# Patient Record
Sex: Male | Born: 1935 | Race: White | Hispanic: No | Marital: Married | Smoking: Former smoker
Health system: Southern US, Community
[De-identification: ages and names within clinical notes are randomized; demographics above are authoritative.]

## PROBLEM LIST (undated history)

## (undated) DIAGNOSIS — F1010 Alcohol abuse, uncomplicated: Secondary | ICD-10-CM

## (undated) DIAGNOSIS — I2510 Atherosclerotic heart disease of native coronary artery without angina pectoris: Secondary | ICD-10-CM

## (undated) DIAGNOSIS — D509 Iron deficiency anemia, unspecified: Secondary | ICD-10-CM

## (undated) DIAGNOSIS — I480 Paroxysmal atrial fibrillation: Secondary | ICD-10-CM

## (undated) DIAGNOSIS — I219 Acute myocardial infarction, unspecified: Secondary | ICD-10-CM

## (undated) DIAGNOSIS — N419 Inflammatory disease of prostate, unspecified: Secondary | ICD-10-CM

## (undated) DIAGNOSIS — K219 Gastro-esophageal reflux disease without esophagitis: Secondary | ICD-10-CM

## (undated) DIAGNOSIS — K589 Irritable bowel syndrome without diarrhea: Secondary | ICD-10-CM

## (undated) DIAGNOSIS — K259 Gastric ulcer, unspecified as acute or chronic, without hemorrhage or perforation: Secondary | ICD-10-CM

## (undated) DIAGNOSIS — C3490 Malignant neoplasm of unspecified part of unspecified bronchus or lung: Secondary | ICD-10-CM

## (undated) DIAGNOSIS — Z5189 Encounter for other specified aftercare: Secondary | ICD-10-CM

## (undated) DIAGNOSIS — C801 Malignant (primary) neoplasm, unspecified: Secondary | ICD-10-CM

## (undated) DIAGNOSIS — M1990 Unspecified osteoarthritis, unspecified site: Secondary | ICD-10-CM

## (undated) DIAGNOSIS — I509 Heart failure, unspecified: Secondary | ICD-10-CM

## (undated) DIAGNOSIS — K635 Polyp of colon: Secondary | ICD-10-CM

## (undated) DIAGNOSIS — I495 Sick sinus syndrome: Secondary | ICD-10-CM

## (undated) DIAGNOSIS — I491 Atrial premature depolarization: Secondary | ICD-10-CM

## (undated) DIAGNOSIS — R7302 Impaired glucose tolerance (oral): Secondary | ICD-10-CM

## (undated) DIAGNOSIS — K222 Esophageal obstruction: Secondary | ICD-10-CM

## (undated) DIAGNOSIS — R252 Cramp and spasm: Secondary | ICD-10-CM

## (undated) DIAGNOSIS — I493 Ventricular premature depolarization: Secondary | ICD-10-CM

## (undated) DIAGNOSIS — I10 Essential (primary) hypertension: Secondary | ICD-10-CM

## (undated) DIAGNOSIS — R609 Edema, unspecified: Secondary | ICD-10-CM

## (undated) DIAGNOSIS — M545 Low back pain: Secondary | ICD-10-CM

## (undated) DIAGNOSIS — I739 Peripheral vascular disease, unspecified: Secondary | ICD-10-CM

## (undated) DIAGNOSIS — G609 Hereditary and idiopathic neuropathy, unspecified: Secondary | ICD-10-CM

## (undated) DIAGNOSIS — D649 Anemia, unspecified: Secondary | ICD-10-CM

## (undated) DIAGNOSIS — Z86010 Personal history of colonic polyps: Secondary | ICD-10-CM

## (undated) DIAGNOSIS — G8929 Other chronic pain: Secondary | ICD-10-CM

## (undated) DIAGNOSIS — E785 Hyperlipidemia, unspecified: Secondary | ICD-10-CM

## (undated) DIAGNOSIS — I8390 Asymptomatic varicose veins of unspecified lower extremity: Secondary | ICD-10-CM

## (undated) HISTORY — DX: Inflammatory disease of prostate, unspecified: N41.9

## (undated) HISTORY — DX: Acute myocardial infarction, unspecified: I21.9

## (undated) HISTORY — DX: Atrial premature depolarization: I49.1

## (undated) HISTORY — DX: Alcohol abuse, uncomplicated: F10.10

## (undated) HISTORY — PX: COLONOSCOPY: SHX174

## (undated) HISTORY — DX: Iron deficiency anemia, unspecified: D50.9

## (undated) HISTORY — DX: Irritable bowel syndrome without diarrhea: K58.9

## (undated) HISTORY — DX: Low back pain: M54.5

## (undated) HISTORY — DX: Peripheral vascular disease, unspecified: I73.9

## (undated) HISTORY — DX: Malignant (primary) neoplasm, unspecified: C80.1

## (undated) HISTORY — DX: Hyperlipidemia, unspecified: E78.5

## (undated) HISTORY — DX: Ventricular premature depolarization: I49.3

## (undated) HISTORY — DX: Hereditary and idiopathic neuropathy, unspecified: G60.9

## (undated) HISTORY — DX: Paroxysmal atrial fibrillation: I48.0

## (undated) HISTORY — DX: Asymptomatic varicose veins of unspecified lower extremity: I83.90

## (undated) HISTORY — DX: Essential (primary) hypertension: I10

## (undated) HISTORY — DX: Encounter for other specified aftercare: Z51.89

## (undated) HISTORY — PX: UPPER GASTROINTESTINAL ENDOSCOPY: SHX188

## (undated) HISTORY — DX: Unspecified osteoarthritis, unspecified site: M19.90

## (undated) HISTORY — DX: Gastric ulcer, unspecified as acute or chronic, without hemorrhage or perforation: K25.9

## (undated) HISTORY — DX: Impaired glucose tolerance (oral): R73.02

## (undated) HISTORY — DX: Cramp and spasm: R25.2

## (undated) HISTORY — DX: Atherosclerotic heart disease of native coronary artery without angina pectoris: I25.10

## (undated) HISTORY — PX: COLECTOMY: SHX59

## (undated) HISTORY — DX: Esophageal obstruction: K22.2

## (undated) HISTORY — PX: CATARACT EXTRACTION: SUR2

## (undated) HISTORY — DX: Edema, unspecified: R60.9

## (undated) HISTORY — DX: Sick sinus syndrome: I49.5

## (undated) HISTORY — DX: Other chronic pain: G89.29

## (undated) HISTORY — DX: Gastro-esophageal reflux disease without esophagitis: K21.9

## (undated) HISTORY — DX: Personal history of colonic polyps: Z86.010

---

## 1988-10-11 LAB — HM COLONOSCOPY: HM Colonoscopy: ABNORMAL

## 1992-10-11 DIAGNOSIS — K219 Gastro-esophageal reflux disease without esophagitis: Secondary | ICD-10-CM

## 1992-10-11 HISTORY — DX: Gastro-esophageal reflux disease without esophagitis: K21.9

## 2003-05-29 LAB — HM COLONOSCOPY: HM Colonoscopy: ABNORMAL

## 2006-03-31 ENCOUNTER — Ambulatory Visit: Payer: Self-pay | Admitting: Internal Medicine

## 2006-05-31 ENCOUNTER — Ambulatory Visit: Payer: Self-pay | Admitting: *Deleted

## 2006-06-02 ENCOUNTER — Ambulatory Visit (HOSPITAL_COMMUNITY): Admission: RE | Admit: 2006-06-02 | Discharge: 2006-06-02 | Payer: Self-pay | Admitting: *Deleted

## 2007-05-04 ENCOUNTER — Ambulatory Visit: Payer: Self-pay | Admitting: Internal Medicine

## 2007-05-04 LAB — CONVERTED CEMR LAB
AST: 26 units/L (ref 0–37)
Basophils Absolute: 0 10*3/uL (ref 0.0–0.1)
Bilirubin, Direct: 0.1 mg/dL (ref 0.0–0.3)
Chloride: 97 meq/L (ref 96–112)
Cholesterol: 148 mg/dL (ref 0–200)
Creatinine, Ser: 0.7 mg/dL (ref 0.4–1.5)
Eosinophils Absolute: 0.1 10*3/uL (ref 0.0–0.6)
Eosinophils Relative: 1.4 % (ref 0.0–5.0)
GFR calc non Af Amer: 117 mL/min
Glucose, Bld: 109 mg/dL — ABNORMAL HIGH (ref 70–99)
HCT: 43.3 % (ref 39.0–52.0)
HDL: 44.9 mg/dL (ref >39.0)
Hemoglobin, Urine: NEGATIVE
Hemoglobin: 15 g/dL (ref 13.0–17.0)
Ketones, ur: NEGATIVE mg/dL
Leukocytes, UA: NEGATIVE
MCHC: 34.7 g/dL (ref 30.0–36.0)
MCV: 89 fL (ref 78.0–100.0)
Monocytes Absolute: 0.7 10*3/uL (ref 0.2–0.7)
Neutrophils Relative %: 63.1 % (ref 43.0–77.0)
Nitrite: NEGATIVE
PSA: 0.39 ng/mL (ref 0.10–4.00)
Potassium: 4.4 meq/L (ref 3.5–5.1)
RBC: 4.86 M/uL (ref 4.22–5.81)
Sodium: 134 meq/L — ABNORMAL LOW (ref 135–145)
TSH: 0.94 microintl units/mL (ref 0.35–5.50)
Total Bilirubin: 0.7 mg/dL (ref 0.3–1.2)
Urobilinogen, UA: 0.2 (ref 0.0–1.0)
WBC: 5.6 10*3/uL (ref 4.5–10.5)

## 2008-03-05 ENCOUNTER — Ambulatory Visit: Payer: Self-pay | Admitting: Internal Medicine

## 2008-03-05 DIAGNOSIS — G609 Hereditary and idiopathic neuropathy, unspecified: Secondary | ICD-10-CM | POA: Insufficient documentation

## 2008-03-05 DIAGNOSIS — K219 Gastro-esophageal reflux disease without esophagitis: Secondary | ICD-10-CM | POA: Insufficient documentation

## 2008-03-05 DIAGNOSIS — Z860100 Personal history of colon polyps, unspecified: Secondary | ICD-10-CM | POA: Insufficient documentation

## 2008-03-05 DIAGNOSIS — K589 Irritable bowel syndrome without diarrhea: Secondary | ICD-10-CM | POA: Insufficient documentation

## 2008-03-05 DIAGNOSIS — I10 Essential (primary) hypertension: Secondary | ICD-10-CM

## 2008-03-05 DIAGNOSIS — K921 Melena: Secondary | ICD-10-CM | POA: Insufficient documentation

## 2008-03-05 DIAGNOSIS — E785 Hyperlipidemia, unspecified: Secondary | ICD-10-CM

## 2008-03-05 DIAGNOSIS — M549 Dorsalgia, unspecified: Secondary | ICD-10-CM | POA: Insufficient documentation

## 2008-03-05 DIAGNOSIS — D509 Iron deficiency anemia, unspecified: Secondary | ICD-10-CM | POA: Insufficient documentation

## 2008-03-05 HISTORY — DX: Gastro-esophageal reflux disease without esophagitis: K21.9

## 2008-03-05 HISTORY — DX: Irritable bowel syndrome, unspecified: K58.9

## 2008-03-05 HISTORY — DX: Hyperlipidemia, unspecified: E78.5

## 2008-03-05 HISTORY — DX: Essential (primary) hypertension: I10

## 2008-03-05 HISTORY — DX: Personal history of colonic polyps: Z86.010

## 2008-03-05 HISTORY — DX: Hereditary and idiopathic neuropathy, unspecified: G60.9

## 2008-03-05 HISTORY — DX: Personal history of colon polyps, unspecified: Z86.0100

## 2008-07-29 ENCOUNTER — Telehealth (INDEPENDENT_AMBULATORY_CARE_PROVIDER_SITE_OTHER): Payer: Self-pay | Admitting: *Deleted

## 2008-10-31 ENCOUNTER — Encounter: Payer: Self-pay | Admitting: Internal Medicine

## 2008-11-11 HISTORY — PX: LUMBAR SPINE SURGERY: SHX701

## 2008-11-14 ENCOUNTER — Ambulatory Visit (HOSPITAL_COMMUNITY): Admission: RE | Admit: 2008-11-14 | Discharge: 2008-11-14 | Payer: Self-pay | Admitting: Neurosurgery

## 2008-11-14 ENCOUNTER — Encounter (INDEPENDENT_AMBULATORY_CARE_PROVIDER_SITE_OTHER): Payer: Self-pay | Admitting: Neurosurgery

## 2008-11-14 ENCOUNTER — Ambulatory Visit: Payer: Self-pay | Admitting: Vascular Surgery

## 2008-11-22 ENCOUNTER — Encounter: Payer: Self-pay | Admitting: Internal Medicine

## 2008-11-26 ENCOUNTER — Inpatient Hospital Stay (HOSPITAL_COMMUNITY): Admission: RE | Admit: 2008-11-26 | Discharge: 2008-11-28 | Payer: Self-pay | Admitting: Neurosurgery

## 2008-12-26 ENCOUNTER — Encounter: Payer: Self-pay | Admitting: Internal Medicine

## 2009-03-26 ENCOUNTER — Encounter: Payer: Self-pay | Admitting: Internal Medicine

## 2009-04-03 ENCOUNTER — Ambulatory Visit: Payer: Self-pay | Admitting: Surgery

## 2009-04-03 ENCOUNTER — Ambulatory Visit (HOSPITAL_COMMUNITY): Admission: RE | Admit: 2009-04-03 | Discharge: 2009-04-03 | Payer: Self-pay | Admitting: Neurosurgery

## 2009-04-03 ENCOUNTER — Encounter (INDEPENDENT_AMBULATORY_CARE_PROVIDER_SITE_OTHER): Payer: Self-pay | Admitting: Neurosurgery

## 2009-05-22 ENCOUNTER — Encounter: Admission: RE | Admit: 2009-05-22 | Discharge: 2009-05-22 | Payer: Self-pay | Admitting: Neurosurgery

## 2009-05-27 ENCOUNTER — Encounter: Payer: Self-pay | Admitting: Internal Medicine

## 2009-06-03 ENCOUNTER — Telehealth: Payer: Self-pay | Admitting: Internal Medicine

## 2009-06-03 ENCOUNTER — Encounter: Admission: RE | Admit: 2009-06-03 | Discharge: 2009-06-03 | Payer: Self-pay | Admitting: Neurosurgery

## 2009-06-09 ENCOUNTER — Ambulatory Visit: Payer: Self-pay | Admitting: Internal Medicine

## 2009-06-09 DIAGNOSIS — I8390 Asymptomatic varicose veins of unspecified lower extremity: Secondary | ICD-10-CM

## 2009-06-09 HISTORY — DX: Asymptomatic varicose veins of unspecified lower extremity: I83.90

## 2009-06-09 LAB — CONVERTED CEMR LAB
ALT: 41 units/L (ref 0–53)
AST: 28 units/L (ref 0–37)
Alkaline Phosphatase: 73 units/L (ref 39–117)
BUN: 22 mg/dL (ref 6–23)
Bilirubin Urine: NEGATIVE
Bilirubin, Direct: 0.1 mg/dL (ref 0.0–0.3)
Calcium: 9.6 mg/dL (ref 8.4–10.5)
Creatinine, Ser: 0.8 mg/dL (ref 0.4–1.5)
Eosinophils Relative: 0.4 % (ref 0.0–5.0)
GFR calc non Af Amer: 100.04 mL/min (ref >60)
Hemoglobin, Urine: NEGATIVE
LDL Cholesterol: 72 mg/dL (ref 0–99)
Lymphocytes Relative: 18.3 % (ref 12.0–46.0)
Monocytes Absolute: 0.7 10*3/uL (ref 0.1–1.0)
Monocytes Relative: 10.7 % (ref 3.0–12.0)
Neutrophils Relative %: 70.2 % (ref 43.0–77.0)
Nitrite: NEGATIVE
Platelets: 237 10*3/uL (ref 150.0–400.0)
RBC: 4.62 M/uL (ref 4.22–5.81)
TSH: 0.5 microintl units/mL (ref 0.35–5.50)
Total Bilirubin: 0.9 mg/dL (ref 0.3–1.2)
Total CHOL/HDL Ratio: 3
Total Protein, Urine: NEGATIVE mg/dL
Triglycerides: 191 mg/dL — ABNORMAL HIGH (ref 0.0–149.0)
WBC: 6.7 10*3/uL (ref 4.5–10.5)

## 2009-06-23 ENCOUNTER — Encounter: Payer: Self-pay | Admitting: Internal Medicine

## 2009-06-30 ENCOUNTER — Ambulatory Visit: Payer: Self-pay | Admitting: Gastroenterology

## 2009-07-10 ENCOUNTER — Encounter: Payer: Self-pay | Admitting: Internal Medicine

## 2009-07-14 ENCOUNTER — Encounter: Payer: Self-pay | Admitting: Gastroenterology

## 2009-07-14 ENCOUNTER — Ambulatory Visit: Payer: Self-pay | Admitting: Gastroenterology

## 2009-07-14 ENCOUNTER — Encounter: Payer: Self-pay | Admitting: Internal Medicine

## 2009-07-18 ENCOUNTER — Encounter: Payer: Self-pay | Admitting: Gastroenterology

## 2009-08-28 ENCOUNTER — Encounter: Payer: Self-pay | Admitting: Internal Medicine

## 2009-09-10 ENCOUNTER — Inpatient Hospital Stay (HOSPITAL_COMMUNITY): Admission: RE | Admit: 2009-09-10 | Discharge: 2009-09-12 | Payer: Self-pay | Admitting: Neurosurgery

## 2009-10-13 ENCOUNTER — Encounter: Payer: Self-pay | Admitting: Internal Medicine

## 2009-11-11 ENCOUNTER — Telehealth: Payer: Self-pay | Admitting: Internal Medicine

## 2010-07-06 ENCOUNTER — Telehealth: Payer: Self-pay | Admitting: Internal Medicine

## 2010-09-02 ENCOUNTER — Ambulatory Visit: Payer: Self-pay | Admitting: Internal Medicine

## 2010-09-02 DIAGNOSIS — E739 Lactose intolerance, unspecified: Secondary | ICD-10-CM | POA: Insufficient documentation

## 2010-09-02 DIAGNOSIS — R609 Edema, unspecified: Secondary | ICD-10-CM

## 2010-09-02 DIAGNOSIS — R252 Cramp and spasm: Secondary | ICD-10-CM | POA: Insufficient documentation

## 2010-09-02 HISTORY — DX: Edema, unspecified: R60.9

## 2010-09-02 HISTORY — DX: Cramp and spasm: R25.2

## 2010-09-03 LAB — CONVERTED CEMR LAB
Alkaline Phosphatase: 68 units/L (ref 39–117)
BUN: 11 mg/dL (ref 6–23)
CO2: 24 meq/L (ref 19–32)
Chloride: 97 meq/L (ref 96–112)
Creatinine, Ser: 0.98 mg/dL (ref 0.40–1.50)
Indirect Bilirubin: 0.4 mg/dL (ref 0.0–0.9)
LDL Cholesterol: 73 mg/dL (ref 0–99)
Total Bilirubin: 0.6 mg/dL (ref 0.3–1.2)
Triglycerides: 149 mg/dL (ref <150)

## 2010-09-04 LAB — CONVERTED CEMR LAB
Bilirubin Urine: NEGATIVE
Eosinophils Relative: 0.8 % (ref 0.0–5.0)
HCT: 43.7 % (ref 39.0–52.0)
Hemoglobin: 15.1 g/dL (ref 13.0–17.0)
Hgb A1c MFr Bld: 6.3 % (ref 4.6–6.5)
Ketones, ur: NEGATIVE mg/dL
Lymphs Abs: 1.3 10*3/uL (ref 0.7–4.0)
Monocytes Relative: 13.1 % — ABNORMAL HIGH (ref 3.0–12.0)
Neutro Abs: 3 10*3/uL (ref 1.4–7.7)
Nitrite: NEGATIVE
Total Protein, Urine: NEGATIVE mg/dL
WBC: 5 10*3/uL (ref 4.5–10.5)
pH: 6 (ref 5.0–8.0)

## 2010-11-12 NOTE — Assessment & Plan Note (Signed)
Summary: follow up pain in toes and weight gain-lb   Vital Signs:  Patient profile:   77 year old male Height:      71 inches Weight:      242 pounds BMI:     33.87 O2 Sat:      97 % on Room air Temp:     97.9 degrees F oral Pulse rate:   93 / minute BP sitting:   124 / 72  (left arm) Cuff size:   large  Vitals Entered By: Zella Ball Ewing CMA Duncan Dull) (September 02, 2010 11:04 AM)  O2 Flow:  Room air  Preventive Care Screening     declines immunizations  CC: Back pain, weight gain, refills/RE   CC:  Back pain, weight gain, and refills/RE.  History of Present Illness: here for wellness and f/u - overall doing well, Pt denies CP, worsening sob, doe, wheezing, orthopnea, pnd, worsening LE edema, palps, dizziness or syncope  Pt denies new neuro symptoms such as headache, facial or extremity weakness  Pt denies polydipsia, polyuria,  Overall good compliance with meds, trying to follow low chol, DM diet, wt stable, little excercise however .  No fever, wt loss, night sweats, loss of appetite or other constitutional symptoms Denies worsening depressive symptoms, suicidal ideation, or panic.   Overall good compliance with meds, and good tolerability.  Pt states good ability with ADL's, low fall risk, home safety reviewed and adequate, no significant change in hearing or vision, trying to follow lower chol diet, and occasionally active only with regular excercise. Does have occasional leg cramps, leg swelling mild persists, and chronic back pain persists no change , meds working well for control.    Preventive Screening-Counseling & Management      Drug Use:  no.    Problems Prior to Update: 1)  Leg Cramps  (ICD-729.82) 2)  Peripheral Edema  (ICD-782.3) 3)  Glucose Intolerance  (ICD-271.3) 4)  Varicose Veins, Lower Extremities  (ICD-454.9) 5)  Preventive Health Care  (ICD-V70.0) 6)  Peripheral Neuropathy  (ICD-356.9) 7)  Gerd  (ICD-530.81) 8)  Ibs  (ICD-564.1) 9)  Colonic Polyps, Hx of   (ICD-V12.72) 10)  Anemia-nos  (ICD-285.9) 11)  Back Pain  (ICD-724.5) 12)  Blood in Stool  (ICD-578.1) 13)  Hypertension  (ICD-401.9) 14)  Hyperlipidemia  (ICD-272.4)  Medications Prior to Update: 1)  Lisinopril-Hydrochlorothiazide 20-25 Mg  Tabs (Lisinopril-Hydrochlorothiazide) .Marland Kitchen.. 1 By Mouth Qd 2)  Pravachol 40 Mg  Tabs (Pravastatin Sodium) .Marland Kitchen.. 1 By Mouth Qd 3)  Tramadol Hcl 50 Mg  Tabs (Tramadol Hcl) .Marland Kitchen.. 1-2 By Mouth Q 6 Hrs As Needed Pain 4)  Adult Aspirin Ec Low Strength 81 Mg  Tbec (Aspirin) .Marland Kitchen.. 1 By Mouth Once Daily  Current Medications (verified): 1)  Lisinopril-Hydrochlorothiazide 20-25 Mg  Tabs (Lisinopril-Hydrochlorothiazide) .Marland Kitchen.. 1 By Mouth Once Daily 2)  Lipitor 20 Mg Tabs (Atorvastatin Calcium) .Marland Kitchen.. 1po Once Daily 3)  Tramadol Hcl 50 Mg  Tabs (Tramadol Hcl) .Marland Kitchen.. 1-2 By Mouth Q 6 Hrs As Needed Pain 4)  Adult Aspirin Ec Low Strength 81 Mg  Tbec (Aspirin) .Marland Kitchen.. 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 - feb 2010 Married 4 sons Drug  use-no  Risk Factors: Smoking Status: quit (06/09/2009)  Past Medical History: Hyperlipidemia Hypertension Anemia-NOS Colonic polyps, hx of IBS GERD hx of esophageal stricture hx of prostatitis Peripheral neuropathy glucose intolerance  Social History: 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         all otherwise negative per pt -    Physical Exam  General:  alert and overweight-appearing.   Head:  normocephalic and atraumatic.   Eyes:  vision grossly intact, pupils equal, and pupils round.   Ears:  R ear normal and L ear normal.   Nose:  no external deformity and no nasal discharge.   Mouth:  no gingival abnormalities and pharynx pink and moist.   Neck:  supple and no masses.   Lungs:  normal respiratory effort and normal breath sounds.   Heart:  normal rate and regular rhythm.   Abdomen:  soft, non-tender, and normal bowel sounds.   Msk:  no joint tenderness and no joint swelling.  , spine nontender Extremities:  trace to 1+ edema bialt, no erythema Neurologic:  cranial nerves II-XII intact and strength normal in all extremities.  gait normal.     Impression & Recommendations:  Problem # 1:  Preventive Health Care (ICD-V70.0) Overall doing well, age appropriate education and counseling updated, referral for preventive services and immunizations addressed, dietary counseling and smoking status adressed , most recent labs reviewed I have personally reviewed and have noted 1.The patient's medical and social history 2.Their use of alcohol, tobacco or illicit drugs 3.Their current medications and supplements 4. Functional ability including ADL's, fall risk, home safety risk, hearing & visual impairment  5.Diet and physical activities 6.Evidence for depression or mood disorders The patients weight, height, BMI  have been recorded in the chart I have made referrals, counseling and provided education to the patient based review of the above  Orders: TLB-BMP (Basic Metabolic Panel-BMET) (80048-METABOL) TLB-CBC Platelet - w/Differential  (85025-CBCD) TLB-Hepatic/Liver Function Pnl (80076-HEPATIC) TLB-Lipid Panel (80061-LIPID) TLB-PSA (Prostate Specific Antigen) (84153-PSA) TLB-TSH (Thyroid Stimulating Hormone) (84443-TSH) TLB-Udip ONLY (81003-UDIP)  Problem # 2:  GLUCOSE INTOLERANCE (ICD-271.3) asympt - for a1c check Orders: TLB-A1C / Hgb A1C (Glycohemoglobin) (83036-A1C)  Problem # 3:  PERIPHERAL EDEMA (ICD-782.3)  His updated medication list for this problem includes:    Lisinopril-hydrochlorothiazide 20-25 Mg Tabs (Lisinopril-hydrochlorothiazide) .Marland Kitchen... 1 by mouth once daily unclear etiology except liekly at least element of venous insufficeincy;    Discussed elevation of the legs, use of compression stockings, sodium restiction, and medication use.   Problem # 4:  LEG CRAMPS (ICD-729.82) to also check magnesium,  ? statin related or HCTZ; overall mild, Continue all previous medications as before this visit except change the statin as below  Problem # 5:  HYPERLIPIDEMIA (ICD-272.4)  His updated medication list for this problem includes:    Lipitor 20 Mg Tabs (Atorvastatin calcium) .Marland Kitchen... 1po once daily to change the pravachol to lipitor 20 mg  for better efficacy, Pt to continue diet efforts, good med tolerance; to check labs - goal LDL less than 100  Labs Reviewed: SGOT: 28 (06/09/2009)   SGPT: 41 (  06/09/2009)   HDL:69.00 (06/09/2009), 44.9 (05/04/2007)  LDL:72 (06/09/2009), 69 (05/04/2007)  Chol:179 (06/09/2009), 148 (05/04/2007)  Trig:191.0 (06/09/2009), 171 (05/04/2007)  Problem # 6:  BACK PAIN (ICD-724.5)  His updated medication list for this problem includes:    Tramadol Hcl 50 Mg Tabs (Tramadol hcl) .Marland Kitchen... 1-2 by mouth q 6 hrs as needed pain    Adult Aspirin Ec Low Strength 81 Mg Tbec (Aspirin) .Marland Kitchen... 1 by mouth once daily chronic stable, ok to cont meds as is  Complete Medication List: 1)  Lisinopril-hydrochlorothiazide 20-25 Mg Tabs (Lisinopril-hydrochlorothiazide) .Marland Kitchen.. 1 by mouth once daily 2)   Lipitor 20 Mg Tabs (Atorvastatin calcium) .Marland Kitchen.. 1po once daily 3)  Tramadol Hcl 50 Mg Tabs (Tramadol hcl) .Marland Kitchen.. 1-2 by mouth q 6 hrs as needed pain 4)  Adult Aspirin Ec Low Strength 81 Mg Tbec (Aspirin) .Marland Kitchen.. 1 by mouth once daily  Patient Instructions: 1)  Please go to the Lab in the basement for your blood and/or urine tests today 2)  Please call the number on the [facility] Card for results of your testing 3)  Continue all previous medications as before this visit , except stop the pravachol 4)  start the lipitor 20 mg after dec 1 when it should be generic 5)  Please schedule a follow-up appointment in 1 year, or sooner if needed Prescriptions: TRAMADOL HCL 50 MG  TABS (TRAMADOL HCL) 1-2 by mouth q 6 hrs as needed pain  #120 x 5   Entered and Authorized by:   Corwin Levins MD   Signed by:   Corwin Levins MD on 09/02/2010   Method used:   Print then Give to Patient   RxID:   [id number] LIPITOR 20 MG TABS (ATORVASTATIN CALCIUM) 1po once daily  #90 x 3   Entered and Authorized by:   Corwin Levins MD   Signed by:   Corwin Levins MD on 09/02/2010   Method used:   Print then Give to Patient   RxID:   [id number] LISINOPRIL-HYDROCHLOROTHIAZIDE 20-25 MG  TABS (LISINOPRIL-HYDROCHLOROTHIAZIDE) 1 by mouth once daily  #90 x 3   Entered and Authorized by:   Corwin Levins MD   Signed by:   Corwin Levins MD on 09/02/2010   Method used:   Print then Give to Patient   RxID:   [id number]    Orders Added: 1)  TLB-A1C / Hgb A1C (Glycohemoglobin) [83036-A1C] 2)  TLB-BMP (Basic Metabolic Panel-BMET) [80048-METABOL] 3)  TLB-CBC Platelet - w/Differential [85025-CBCD] 4)  TLB-Hepatic/Liver Function Pnl [80076-HEPATIC] 5)  TLB-Lipid Panel [80061-LIPID] 6)  TLB-PSA (Prostate Specific Antigen) [01027-OZD] 7)  TLB-TSH (Thyroid Stimulating Hormone) [84443-TSH] 8)  TLB-Udip ONLY [81003-UDIP] 9)  Est. Patient 65& > [66440]  Appended Document: Orders Update    Clinical Lists Changes  Orders: Added  new Test order of T- * Misc. Laboratory test [phone]) - Signed

## 2010-11-12 NOTE — Progress Notes (Signed)
  Phone Note Refill Request Message from:  Fax from Pharmacy on July 06, 2010 12:02 PM  Refills Requested: Medication #1:  LISINOPRIL-HYDROCHLOROTHIAZIDE 20-25 MG  TABS 1 by mouth qd   Dosage confirmed as above?Dosage Confirmed   Last Refilled: 05/2009   Notes: [facility] Initial call taken by: Zella Ball Ewing CMA (AAMA),  July 06, 2010 12:02 PM    Prescriptions: LISINOPRIL-HYDROCHLOROTHIAZIDE 20-25 MG  TABS (LISINOPRIL-HYDROCHLOROTHIAZIDE) 1 by mouth qd  #30 Each x 0   Entered by:   Scharlene Gloss CMA (AAMA)   Authorized by:   Corwin Levins MD   Signed by:   Scharlene Gloss CMA (AAMA) on 07/06/2010   Method used:   Faxed to ...       [facility]       [address]       [place], [place]  [address]       Ph: [phone]       Fax: [phone]   RxID:   [id number]

## 2010-11-12 NOTE — Letter (Signed)
Summary: [facility]   Imported By: Sherian Rein 10/28/2009 14:43:25  _____________________________________________________________________  External Attachment:    Type:   Image     Comment:   External Document

## 2010-11-12 NOTE — Progress Notes (Signed)
  Phone Note Refill Request  on November 11, 2009 9:58 AM  Refills Requested: Medication #1:  LISINOPRIL-HYDROCHLOROTHIAZIDE 20-25 MG  TABS 1 by mouth qd   Dosage confirmed as above?Dosage Confirmed   Notes: [name] Initial call taken by: Scharlene Gloss,  November 11, 2009 9:58 AM    Prescriptions: LISINOPRIL-HYDROCHLOROTHIAZIDE 20-25 MG  TABS (LISINOPRIL-HYDROCHLOROTHIAZIDE) 1 by mouth qd  #30 Each x 7   Entered by:   Scharlene Gloss   Authorized by:   Corwin Levins MD   Signed by:   Scharlene Gloss on 11/11/2009   Method used:   Faxed to ...       [facility]       [address]       [place], [place]  [address]       Ph: [phone]       Fax: [phone]   RxID:   [id number]

## 2011-01-12 LAB — CBC
HCT: 42.1 % (ref 39.0–52.0)
Hemoglobin: 14.7 g/dL (ref 13.0–17.0)
MCV: 90.4 fL (ref 78.0–100.0)
Platelets: 203 10*3/uL (ref 150–400)
WBC: 4.8 10*3/uL (ref 4.0–10.5)

## 2011-01-12 LAB — BASIC METABOLIC PANEL
BUN: 16 mg/dL (ref 6–23)
Chloride: 99 mEq/L (ref 96–112)
Potassium: 4 mEq/L (ref 3.5–5.1)

## 2011-01-26 LAB — BASIC METABOLIC PANEL
BUN: 9 mg/dL (ref 6–23)
BUN: 11 mg/dL (ref 6–23)
CO2: 26 mEq/L (ref 19–32)
Calcium: 9.4 mg/dL (ref 8.4–10.5)
Chloride: 95 mEq/L — ABNORMAL LOW (ref 96–112)
Creatinine, Ser: 0.82 mg/dL (ref 0.4–1.5)
Creatinine, Ser: 0.77 mg/dL (ref 0.4–1.5)
GFR calc Af Amer: >60 mL/min (ref >60)
GFR calc non Af Amer: >60 mL/min (ref >60)
Glucose, Bld: 123 mg/dL — ABNORMAL HIGH (ref 70–99)

## 2011-01-26 LAB — CBC
MCHC: 34.9 g/dL (ref 30.0–36.0)
MCV: 88.6 fL (ref 78.0–100.0)
Platelets: 223 10*3/uL (ref 150–400)
RDW: 13.6 % (ref 11.5–15.5)
WBC: 5.2 10*3/uL (ref 4.0–10.5)

## 2011-02-23 NOTE — Op Note (Signed)
[name], [name]                ACCOUNT NO.:  [id number]   MEDICAL RECORD NO.:  [id number]          PATIENT TYPE:  INP   LOCATION:  3006                         FACILITY:  [facility]   PHYSICIAN:  Hilda Lias, M.D.   DATE OF BIRTH:  1931-11-12   DATE OF PROCEDURE:  11/26/2008  DATE OF DISCHARGE:                               OPERATIVE REPORT   PREOPERATIVE DIAGNOSIS:  Lumbar stenosis, L3 to L5-S1, with neurogenic  claudication.   POSTOPERATIVE DIAGNOSIS:  Lumbar stenosis, L3 to L5-S1, with neurogenic  claudication.   PROCEDURE:  Bilateral L3, L4, L5 laminectomy, bilateral L2-3, L3-4, L4-  5, and L5-S1 foraminotomy.   SURGEON:  Hilda Lias, MD   ASSISTANT:  Danae Orleans. Venetia Maxon, MD   CLINICAL HISTORY:  [name] is a gentleman who came to see me because  of back pain radiating to both legs.  The patient tells me that walking  a block he had to sit down because he developed cramps in both legs.  MRI showed severe stenosis at the levels L3-4, L4-5, and L5-S1.  The  patient wants to proceed with surgery.  The risks were explained to him.   DESCRIPTION OF PROCEDURE:  The patient was taken to the OR, and after  intubation, he was positioned in prone manner.  The back was cleaned  with DuraPrep.  The drapes were applied.  Midline incision from L2-3  down to L5-S1 was made and muscle was retracted all the way laterally.  X-rays showed that we were at the level of spinous process L2-L3.  From  then on with the Leksell and Stille, we removed the spinous processes of  L3, L4, and L5.  With the drill, we drilled through the lamina of those  3 levels until we were able to find the thick yellow ligament.  Then  using the Kerrison punch, 2 and 3 mm, we did a decompression with  removal of the yellow ligament all the way laterally and removal of the  lamina up to the facet joint leaving the facet joint intact.  The  patient had quite a bit of thickening of the ligament; the worst was at  the  level of L4-5.  Having good decompression, we proceeded with a  foraminotomy to decompress the L3, L4, L5, and S1 nerve roots  bilaterally.  This was accomplished, and at the end, we had plenty of  space for the thecal sac as well as the foramen.  Then, Valsalva  maneuver was negative.  The area was irrigated.  Fentanyl and Depo-  Medrol were left in the epidural space, and the wound was closed with  Vicryl and Steri-Strips.           ______________________________  Hilda Lias, M.D.     EB/MEDQ  D:  11/26/2008  T:  11/26/2008  Job:  [id number]

## 2011-10-04 ENCOUNTER — Other Ambulatory Visit: Payer: Self-pay | Admitting: Internal Medicine

## 2011-10-12 ENCOUNTER — Encounter: Payer: Self-pay | Admitting: Internal Medicine

## 2011-10-12 DIAGNOSIS — R7302 Impaired glucose tolerance (oral): Secondary | ICD-10-CM

## 2011-10-12 DIAGNOSIS — Z0000 Encounter for general adult medical examination without abnormal findings: Secondary | ICD-10-CM | POA: Insufficient documentation

## 2011-10-12 DIAGNOSIS — I739 Peripheral vascular disease, unspecified: Secondary | ICD-10-CM

## 2011-10-12 HISTORY — DX: Peripheral vascular disease, unspecified: I73.9

## 2011-10-12 HISTORY — DX: Impaired glucose tolerance (oral): R73.02

## 2011-10-14 ENCOUNTER — Other Ambulatory Visit (INDEPENDENT_AMBULATORY_CARE_PROVIDER_SITE_OTHER): Payer: Medicare Other

## 2011-10-14 ENCOUNTER — Ambulatory Visit (INDEPENDENT_AMBULATORY_CARE_PROVIDER_SITE_OTHER): Payer: Medicare Other | Admitting: Internal Medicine

## 2011-10-14 ENCOUNTER — Other Ambulatory Visit: Payer: Self-pay | Admitting: Internal Medicine

## 2011-10-14 ENCOUNTER — Encounter: Payer: Self-pay | Admitting: Internal Medicine

## 2011-10-14 ENCOUNTER — Telehealth: Payer: Self-pay

## 2011-10-14 VITALS — BP 132/80 | HR 77 | Temp 97.9°F | Ht 72.0 in | Wt 249.0 lb

## 2011-10-14 DIAGNOSIS — G8929 Other chronic pain: Secondary | ICD-10-CM

## 2011-10-14 DIAGNOSIS — E785 Hyperlipidemia, unspecified: Secondary | ICD-10-CM

## 2011-10-14 DIAGNOSIS — M545 Low back pain, unspecified: Secondary | ICD-10-CM

## 2011-10-14 DIAGNOSIS — I739 Peripheral vascular disease, unspecified: Secondary | ICD-10-CM | POA: Insufficient documentation

## 2011-10-14 DIAGNOSIS — Z0000 Encounter for general adult medical examination without abnormal findings: Secondary | ICD-10-CM

## 2011-10-14 DIAGNOSIS — Z125 Encounter for screening for malignant neoplasm of prostate: Secondary | ICD-10-CM

## 2011-10-14 HISTORY — DX: Other chronic pain: G89.29

## 2011-10-14 HISTORY — DX: Low back pain, unspecified: M54.50

## 2011-10-14 LAB — CBC WITH DIFFERENTIAL/PLATELET
Basophils Relative: 1.4 % (ref 0.0–3.0)
Eosinophils Absolute: 0.1 10*3/uL (ref 0.0–0.7)
MCHC: 34.3 g/dL (ref 30.0–36.0)
MCV: 89.7 fl (ref 78.0–100.0)
Monocytes Absolute: 0.6 10*3/uL (ref 0.1–1.0)
Neutrophils Relative %: 53.8 % (ref 43.0–77.0)
Platelets: 186 10*3/uL (ref 150.0–400.0)
RBC: 5.14 Mil/uL (ref 4.22–5.81)
RDW: 13.3 % (ref 11.5–14.6)

## 2011-10-14 LAB — HEPATIC FUNCTION PANEL
ALT: 54 U/L — ABNORMAL HIGH (ref 0–53)
AST: 41 U/L — ABNORMAL HIGH (ref 0–37)
Alkaline Phosphatase: 69 U/L (ref 39–117)
Bilirubin, Direct: 0.2 mg/dL (ref 0.0–0.3)
Total Bilirubin: 0.9 mg/dL (ref 0.3–1.2)

## 2011-10-14 LAB — URINALYSIS, ROUTINE W REFLEX MICROSCOPIC
Bilirubin Urine: NEGATIVE
Hgb urine dipstick: NEGATIVE
Nitrite: NEGATIVE
Total Protein, Urine: NEGATIVE

## 2011-10-14 LAB — LDL CHOLESTEROL, DIRECT: Direct LDL: 133.2 mg/dL

## 2011-10-14 LAB — BASIC METABOLIC PANEL
Chloride: 99 mEq/L (ref 96–112)
Potassium: 4.6 mEq/L (ref 3.5–5.1)
Sodium: 136 mEq/L (ref 135–145)

## 2011-10-14 LAB — LIPID PANEL
Total CHOL/HDL Ratio: 3
VLDL: 28.4 mg/dL (ref 0.0–40.0)

## 2011-10-14 MED ORDER — ATORVASTATIN CALCIUM 20 MG PO TABS: 20.0000 mg | ORAL_TABLET | Freq: Every day | ORAL | Status: DC

## 2011-10-14 MED ORDER — LISINOPRIL-HYDROCHLOROTHIAZIDE 20-25 MG PO TABS: 1.0000 | ORAL_TABLET | Freq: Every day | ORAL | Status: DC

## 2011-10-14 MED ORDER — TRAMADOL HCL 50 MG PO TABS: ORAL_TABLET | ORAL | Status: DC

## 2011-10-14 MED ORDER — PRAVASTATIN SODIUM 40 MG PO TABS: 40.0000 mg | ORAL_TABLET | Freq: Every day | ORAL | Status: DC

## 2011-10-14 NOTE — Telephone Encounter (Signed)
Sorry - the pravachol 40 is the correct one

## 2011-10-14 NOTE — Patient Instructions (Addendum)
OK to stop the lipitor as you have Start the pravachol 40 mg per day Continue all other medications as before Please go to LAB in the Basement for the blood and/or urine tests to be done today Please call the phone number [phone] (the [employer] System) for results of testing in 2-3 days;  When calling, simply dial the number, and when prompted enter the MRN number above (the Medical Record Number) and the # key, then the message should start. Please return in 1 year for your yearly visit, or sooner if needed, with Lab testing done 3-5 days before

## 2011-10-14 NOTE — Assessment & Plan Note (Signed)

## 2011-10-14 NOTE — Telephone Encounter (Signed)
Pharmacy received prescription for pravastatin and escript for Lipitor. Which is correct?

## 2011-10-15 NOTE — Telephone Encounter (Signed)
Pharmacy informed.

## 2011-10-17 ENCOUNTER — Encounter: Payer: Self-pay | Admitting: Internal Medicine

## 2011-10-17 NOTE — Progress Notes (Signed)
Subjective:    Patient ID: Vincent Mcclure, male    DOB: 1932-07-29, 78 y.o.   MRN: [id number]  HPI  Here for wellness and f/u;  Overall doing ok;  Pt denies CP, worsening SOB, DOE, wheezing, orthopnea, PND, worsening LE edema, palpitations, dizziness or syncope.  Pt denies neurological change such as new Headache, facial or extremity weakness.  Pt denies polydipsia, polyuria, or low sugar symptoms. Pt states overall good compliance with treatment and medications, good tolerability, and trying to follow lower cholesterol diet.  Pt denies worsening depressive symptoms, suicidal ideation or panic. No fever, wt loss, night sweats, loss of appetite, or other constitutional symptoms.  Pt states good ability with ADL's, low fall risk, home safety reviewed and adequate, no significant changes in hearing or vision, and occasionally active with exercise.  Pt continues to have recurring LBP without change in severity, bowel or bladder change, fever, wt loss,  worsening LE pain/numbness/weakness, gait change or falls.  Not taking the lipitor due to myalgias, would consider re-start pravachol Past Medical History  Diagnosis Date  . ANEMIA-NOS 03/05/2008  . BACK PAIN 03/05/2008  . COLONIC POLYPS, HX OF 03/05/2008  . GERD 03/05/2008  . HYPERLIPIDEMIA 03/05/2008  . HYPERTENSION 03/05/2008  . IBS 03/05/2008  . LEG CRAMPS 09/02/2010  . Impaired glucose tolerance 10/12/2011  . PERIPHERAL EDEMA 09/02/2010  . PERIPHERAL NEUROPATHY 03/05/2008  . VARICOSE VEINS, LOWER EXTREMITIES 06/09/2009  . Esophageal stricture     hx of  . Prostatitis     hx of  . PAD (peripheral artery disease) 10/14/2011  . Chronic low back pain 10/14/2011   Past Surgical History  Procedure Date  . Lumbar spine surgery Feb 2010    s/p-Dr Jeral Fruit    reports that he quit smoking about 2 years ago. He does not have any smokeless tobacco history on file. He reports that he drinks alcohol. He reports that he does not use illicit drugs. family history  includes Cancer in his mother; Diabetes in his father and sister; Heart disease in his father; Hypertension in his father; and Stroke in his father. Allergies  Allergen Reactions  . Lipitor (Atorvastatin Calcium) Other (See Comments)    Myalgia    Current Outpatient Prescriptions on File Prior to Visit  Medication Sig Dispense Refill  . aspirin EC 81 MG tablet Take 81 mg by mouth daily.         Review of Systems Review of Systems  Constitutional: Negative for diaphoresis, activity change, appetite change and unexpected weight change.  HENT: Negative for hearing loss, ear pain, facial swelling, mouth sores and neck stiffness.   Eyes: Negative for pain, redness and visual disturbance.  Respiratory: Negative for shortness of breath and wheezing.   Cardiovascular: Negative for chest pain and palpitations.  Gastrointestinal: Negative for diarrhea, blood in stool, abdominal distention and rectal pain.  Genitourinary: Negative for hematuria, flank pain and decreased urine volume.  Musculoskeletal: Negative for myalgias and joint swelling.  Skin: Negative for color change and wound.  Neurological: Negative for syncope and numbness.  Hematological: Negative for adenopathy.  Psychiatric/Behavioral: Negative for hallucinations, self-injury, decreased concentration and agitation.      Objective:   Physical Exam BP 132/80  Pulse 77  Temp(Src) 97.9 F (36.6 C) (Oral)  Ht 6' (1.829 m)  Wt 249 lb (112.946 kg)  BMI 33.77 kg/m2  SpO2 96% Physical Exam  VS noted Constitutional: Pt is oriented to person, place, and time. Appears well-developed and well-nourished.  HENT:  Head: Normocephalic and atraumatic.  Right Ear: External ear normal.  Left Ear: External ear normal.  Nose: Nose normal.  Mouth/Throat: Oropharynx is clear and moist.  Eyes: Conjunctivae and EOM are normal. Pupils are equal, round, and reactive to light.  Neck: Normal range of motion. Neck supple. No JVD present. No  tracheal deviation present.  Cardiovascular: Normal rate, regular rhythm, normal heart sounds and intact distal pulses.   Pulmonary/Chest: Effort normal and breath sounds normal.  Abdominal: Soft. Bowel sounds are normal. There is no tenderness.  Musculoskeletal: Normal range of motion. Exhibits no edema.  Lymphadenopathy:  Has no cervical adenopathy.  Neurological: Pt is alert and oriented to person, place, and time. Pt has normal reflexes. No cranial nerve deficit.  Skin: Skin is warm and dry. No rash noted.  Psychiatric:  Has  normal mood and affect. Behavior is normal.     Assessment & Plan:

## 2011-10-17 NOTE — Assessment & Plan Note (Signed)
Not taking the lipitor due to myalgias,  Ok to re-start the pravachol - done per emr

## 2011-10-17 NOTE — Assessment & Plan Note (Signed)
stable overall by hx and exam, most recent data reviewed with pt, and pt to continue medical treatment as before, for refills today 

## 2012-07-05 ENCOUNTER — Other Ambulatory Visit: Payer: Self-pay | Admitting: Internal Medicine

## 2012-07-05 NOTE — Telephone Encounter (Signed)
Done erx 

## 2012-10-17 ENCOUNTER — Other Ambulatory Visit: Payer: Self-pay | Admitting: Internal Medicine

## 2013-01-11 ENCOUNTER — Other Ambulatory Visit: Payer: Self-pay | Admitting: Internal Medicine

## 2013-01-11 NOTE — Telephone Encounter (Signed)
Done erx 

## 2013-01-19 ENCOUNTER — Other Ambulatory Visit: Payer: Self-pay | Admitting: Internal Medicine

## 2013-02-22 ENCOUNTER — Other Ambulatory Visit: Payer: Self-pay | Admitting: Internal Medicine

## 2013-03-23 ENCOUNTER — Other Ambulatory Visit (INDEPENDENT_AMBULATORY_CARE_PROVIDER_SITE_OTHER): Payer: Medicare Other

## 2013-03-23 ENCOUNTER — Ambulatory Visit (INDEPENDENT_AMBULATORY_CARE_PROVIDER_SITE_OTHER): Payer: Medicare Other | Admitting: Internal Medicine

## 2013-03-23 ENCOUNTER — Encounter: Payer: Self-pay | Admitting: Internal Medicine

## 2013-03-23 VITALS — BP 130/70 | HR 90 | Temp 97.8°F | Ht 71.0 in | Wt 244.5 lb

## 2013-03-23 DIAGNOSIS — R7302 Impaired glucose tolerance (oral): Secondary | ICD-10-CM

## 2013-03-23 DIAGNOSIS — M545 Low back pain, unspecified: Secondary | ICD-10-CM

## 2013-03-23 DIAGNOSIS — H9190 Unspecified hearing loss, unspecified ear: Secondary | ICD-10-CM | POA: Insufficient documentation

## 2013-03-23 DIAGNOSIS — R7309 Other abnormal glucose: Secondary | ICD-10-CM

## 2013-03-23 DIAGNOSIS — Z0000 Encounter for general adult medical examination without abnormal findings: Secondary | ICD-10-CM

## 2013-03-23 DIAGNOSIS — G8929 Other chronic pain: Secondary | ICD-10-CM

## 2013-03-23 DIAGNOSIS — Z136 Encounter for screening for cardiovascular disorders: Secondary | ICD-10-CM | POA: Insufficient documentation

## 2013-03-23 DIAGNOSIS — H9191 Unspecified hearing loss, right ear: Secondary | ICD-10-CM

## 2013-03-23 MED ORDER — PRAVASTATIN SODIUM 40 MG PO TABS: ORAL_TABLET | ORAL | Status: DC

## 2013-03-23 MED ORDER — LISINOPRIL-HYDROCHLOROTHIAZIDE 20-25 MG PO TABS: ORAL_TABLET | ORAL | Status: DC

## 2013-03-23 MED ORDER — TRAMADOL HCL 50 MG PO TABS: ORAL_TABLET | ORAL | Status: DC

## 2013-03-23 NOTE — Assessment & Plan Note (Signed)
Ok for referral Dr Minna Merritts  - for pain

## 2013-03-23 NOTE — Patient Instructions (Addendum)
Please continue all other medications as before, and refills have been done if requested - the tramadol The EKG was [facility] today You will be contacted regarding the referral for: Dr Ramos/orthopedics, adn ENT for the hearing loss on the right Please continue your efforts at being more active, low cholesterol diet, and weight control. You are otherwise up to date with prevention measures today. Please go to the LAB in the Basement (turn left off the elevator) for the tests to be done today You will be contacted by phone if any changes need to be made immediately.  Otherwise, you will receive a letter about your results with an explanation  Please remember to sign up for My Chart if you have not done so, as this will be important to you in the future with finding out test results, communicating by private email, and scheduling acute appointments online when needed.  Please return in 1 year for your yearly visit, or sooner if needed, with Lab testing done 3-5 days before

## 2013-03-23 NOTE — Assessment & Plan Note (Signed)

## 2013-03-23 NOTE — Progress Notes (Signed)
Subjective:    Patient ID: Vincent Mcclure, male    DOB: [birth year], 79 y.o.   MRN: [id number]  HPI  Here for wellness and f/u;  Overall doing ok;  Pt denies CP, worsening SOB, DOE, wheezing, orthopnea, PND, worsening LE edema, palpitations, dizziness or syncope, though has some chronic LE edema trace to 1+ for about 3 yrs.  Pt denies neurological change such as new headache, facial or extremity weakness.  Pt denies polydipsia, polyuria, or low sugar symptoms. Pt states overall good compliance with treatment and medications, good tolerability, and has been trying to follow lower cholesterol diet.  Pt denies worsening depressive symptoms, suicidal ideation or panic. No fever, night sweats, wt loss, loss of appetite, or other constitutional symptoms.  Pt states good ability with ADL's, has low fall risk, home safety reviewed and adequate, no other significant changes in hearing or vision, and only occasionally active with exercise.  Pt continues to have recurring LBP without change in severity, bowel or bladder change, fever, wt loss,  worsening LE pain/numbness/weakness, gait change or falls, still has chronic pain 24/7 s/p lumbar surg x 2 per Dr Jeral Fruit.  Tramadol works ok, does not want stronger, takes alleve prn.  Did have fairly recent LE arterial dopplers neg for PAD.  He is OK with referral to pain MD. delcines pneumovax or shingles shot. Past Medical History  Diagnosis Date  . ANEMIA-NOS 03/05/2008  . BACK PAIN 03/05/2008  . COLONIC POLYPS, HX OF 03/05/2008  . GERD 03/05/2008  . HYPERLIPIDEMIA 03/05/2008  . HYPERTENSION 03/05/2008  . IBS 03/05/2008  . LEG CRAMPS 09/02/2010  . Impaired glucose tolerance 10/12/2011  . PERIPHERAL EDEMA 09/02/2010  . PERIPHERAL NEUROPATHY 03/05/2008  . VARICOSE VEINS, LOWER EXTREMITIES 06/09/2009  . Esophageal stricture     hx of  . Prostatitis     hx of  . PAD (peripheral artery disease) 10/14/2011  . Chronic low back pain 10/14/2011   Past Surgical History  Procedure  Laterality Date  . Lumbar spine surgery  Feb 2010    s/p-Dr Jeral Fruit    reports that he quit smoking about 4 years ago. He does not have any smokeless tobacco history on file. He reports that  drinks alcohol. He reports that he does not use illicit drugs. family history includes Cancer in his mother; Diabetes in his father and sister; Heart disease in his father; Hypertension in his father; and Stroke in his father. Allergies  Allergen Reactions  . Lipitor (Atorvastatin Calcium) Other (See Comments)    Myalgia    Current Outpatient Prescriptions on File Prior to Visit  Medication Sig Dispense Refill  . aspirin EC 81 MG tablet Take 81 mg by mouth daily.        Marland Kitchen lisinopril-hydrochlorothiazide (PRINZIDE,ZESTORETIC) 20-25 MG per tablet TAKE ONE TABLET BY MOUTH EVERY DAY  90 tablet  0  . pravastatin (PRAVACHOL) 40 MG tablet TAKE ONE TABLET BY MOUTH EVERY DAY  30 tablet  0   No current facility-administered medications on file prior to visit.   Review of Systems  VS noted,  Constitutional: Pt is oriented to person, place, and time. Appears well-developed and well-nourished.  Head: Normocephalic and atraumatic.  Right Ear: External ear normal.  Left Ear: External ear normal.  Nose: Nose normal.  Mouth/Throat: Oropharynx is clear and moist.  Eyes: Conjunctivae and EOM are normal. Pupils are equal, round, and reactive to light.  Neck: Normal range of motion. Neck supple. No JVD present. No tracheal deviation present.  Cardiovascular: Normal rate, regular rhythm, normal heart sounds and intact distal pulses.   Pulmonary/Chest: Effort normal and breath sounds normal.  Abdominal: Soft. Bowel sounds are normal. There is no tenderness. No HSM  Musculoskeletal: Normal range of motion. Exhibits no edema.  Lymphadenopathy:  Has no cervical adenopathy.  Neurological: Pt is alert and oriented to person, place, and time. Pt has normal reflexes. No cranial nerve deficit.  Skin: Skin is warm and dry. No  rash noted.  Psychiatric:  Has  normal mood and affect. Behavior is normal.      Objective:   Physical Exam BP 130/70  Pulse 90  Temp(Src) 97.8 F (36.6 C) (Oral)  Ht 5\' 11"  (1.803 m)  Wt 244 lb 8 oz (110.904 kg)  BMI 34.12 kg/m2  SpO2 94% VS noted,  Constitutional: Pt is oriented to person, place, and time. Appears well-developed and well-nourished.  Head: Normocephalic and atraumatic.  Right Ear: External ear normal.  Left Ear: External ear normal.  Nose: Nose normal.  Mouth/Throat: Oropharynx is clear and moist.  Eyes: Conjunctivae and EOM are normal. Pupils are equal, round, and reactive to light.  Neck: Normal range of motion. Neck supple. No JVD present. No tracheal deviation present.  Cardiovascular: Normal rate, regular rhythm, normal heart sounds and intact distal pulses.   Pulmonary/Chest: Effort normal and breath sounds normal.  Abdominal: Soft. Bowel sounds are normal. There is no tenderness. No HSM  Musculoskeletal: Normal range of motion. Exhibits no edema.  Lymphadenopathy:  Has no cervical adenopathy.  Neurological: Pt is alert and oriented to person, place, and time. Pt has normal reflexes. No cranial nerve deficit.  Skin: Skin is warm and dry. No rash noted.  Psychiatric:  Has  normal mood and affect. Behavior is normal.     Assessment & Plan:

## 2013-03-23 NOTE — Assessment & Plan Note (Signed)
Mod to severe on right, for ENT eval

## 2013-10-20 ENCOUNTER — Other Ambulatory Visit: Payer: Self-pay | Admitting: *Deleted

## 2013-10-20 MED ORDER — TRAMADOL HCL 50 MG PO TABS: ORAL_TABLET | ORAL | Status: DC

## 2013-10-20 NOTE — Telephone Encounter (Signed)
Done hardcopy to robin  

## 2013-10-22 NOTE — Telephone Encounter (Signed)
Faxed script [facility] to [facility]...Johny Chess

## 2013-10-23 ENCOUNTER — Other Ambulatory Visit: Payer: Self-pay

## 2013-10-23 MED ORDER — LISINOPRIL-HYDROCHLOROTHIAZIDE 20-25 MG PO TABS: ORAL_TABLET | ORAL | Status: DC

## 2013-10-23 MED ORDER — PRAVASTATIN SODIUM 40 MG PO TABS: ORAL_TABLET | ORAL | Status: DC

## 2014-01-08 ENCOUNTER — Other Ambulatory Visit: Payer: Self-pay

## 2014-01-08 MED ORDER — TRAMADOL HCL 50 MG PO TABS: ORAL_TABLET | ORAL | Status: DC

## 2014-01-08 NOTE — Telephone Encounter (Signed)
Done hardcopy to robin  

## 2014-01-08 NOTE — Telephone Encounter (Signed)
Faxed hardcopy to [facility].

## 2014-01-08 NOTE — Telephone Encounter (Signed)
The patient called and is hoping to get a refill of his tramadol rx refilled.

## 2014-01-30 ENCOUNTER — Other Ambulatory Visit (INDEPENDENT_AMBULATORY_CARE_PROVIDER_SITE_OTHER): Payer: Commercial Managed Care - HMO

## 2014-01-30 ENCOUNTER — Encounter: Payer: Self-pay | Admitting: Internal Medicine

## 2014-01-30 ENCOUNTER — Ambulatory Visit (INDEPENDENT_AMBULATORY_CARE_PROVIDER_SITE_OTHER): Payer: Commercial Managed Care - HMO | Admitting: Internal Medicine

## 2014-01-30 VITALS — BP 140/102 | HR 91 | Temp 97.0°F | Resp 14 | Ht 72.0 in | Wt 240.5 lb

## 2014-01-30 DIAGNOSIS — Z0000 Encounter for general adult medical examination without abnormal findings: Secondary | ICD-10-CM

## 2014-01-30 DIAGNOSIS — M519 Unspecified thoracic, thoracolumbar and lumbosacral intervertebral disc disorder: Secondary | ICD-10-CM | POA: Insufficient documentation

## 2014-01-30 LAB — CBC WITH DIFFERENTIAL/PLATELET
Basophils Absolute: 0 10*3/uL (ref 0.0–0.1)
Basophils Relative: 0.3 % (ref 0.0–3.0)
EOS ABS: 0.1 10*3/uL (ref 0.0–0.7)
EOS PCT: 1.7 % (ref 0.0–5.0)
HEMATOCRIT: 45.2 % (ref 39.0–52.0)
Hemoglobin: 15.4 g/dL (ref 13.0–17.0)
LYMPHS ABS: 1.2 10*3/uL (ref 0.7–4.0)
Lymphocytes Relative: 24.6 % (ref 12.0–46.0)
MCHC: 34 g/dL (ref 30.0–36.0)
MCV: 88.8 fl (ref 78.0–100.0)
MONO ABS: 0.7 10*3/uL (ref 0.1–1.0)
Monocytes Relative: 13.7 % — ABNORMAL HIGH (ref 3.0–12.0)
NEUTROS PCT: 59.7 % (ref 43.0–77.0)
Neutro Abs: 3 10*3/uL (ref 1.4–7.7)
Platelets: 208 10*3/uL (ref 150.0–400.0)
RBC: 5.09 Mil/uL (ref 4.22–5.81)
RDW: 13.8 % (ref 11.5–14.6)
WBC: 4.9 10*3/uL (ref 4.5–10.5)

## 2014-01-30 LAB — HEPATIC FUNCTION PANEL
ALT: 28 U/L (ref 0–53)
AST: 25 U/L (ref 0–37)
Albumin: 4.2 g/dL (ref 3.5–5.2)
Alkaline Phosphatase: 63 U/L (ref 39–117)
BILIRUBIN DIRECT: 0.2 mg/dL (ref 0.0–0.3)
BILIRUBIN TOTAL: 1 mg/dL (ref 0.3–1.2)
Total Protein: 7.2 g/dL (ref 6.0–8.3)

## 2014-01-30 LAB — BASIC METABOLIC PANEL
BUN: 19 mg/dL (ref 6–23)
CO2: 31 mEq/L (ref 19–32)
Calcium: 9.5 mg/dL (ref 8.4–10.5)
Chloride: 95 mEq/L — ABNORMAL LOW (ref 96–112)
Creatinine, Ser: 0.9 mg/dL (ref 0.4–1.5)
GFR: 84.11 mL/min (ref >60.00)
Glucose, Bld: 97 mg/dL (ref 70–99)
POTASSIUM: 4.3 meq/L (ref 3.5–5.1)
Sodium: 133 mEq/L — ABNORMAL LOW (ref 135–145)

## 2014-01-30 LAB — URINALYSIS, ROUTINE W REFLEX MICROSCOPIC
Bilirubin Urine: NEGATIVE
HGB URINE DIPSTICK: NEGATIVE
Ketones, ur: NEGATIVE
LEUKOCYTES UA: NEGATIVE
NITRITE: NEGATIVE
Specific Gravity, Urine: 1.01 (ref 1.000–1.030)
Total Protein, Urine: NEGATIVE
Urine Glucose: NEGATIVE
Urobilinogen, UA: 0.2 (ref 0.0–1.0)
WBC UA: NONE SEEN — AB (ref 0–?)
pH: 7.5 (ref 5.0–8.0)

## 2014-01-30 LAB — LIPID PANEL
Cholesterol: 153 mg/dL (ref 0–200)
HDL: 64.6 mg/dL (ref >39.00)
LDL CALC: 67 mg/dL (ref 0–99)
Total CHOL/HDL Ratio: 2
Triglycerides: 108 mg/dL (ref 0.0–149.0)
VLDL: 21.6 mg/dL (ref 0.0–40.0)

## 2014-01-30 LAB — TSH: TSH: 0.66 u[IU]/mL (ref 0.35–5.50)

## 2014-01-30 NOTE — Patient Instructions (Addendum)
Please continue all other medications as before, and refills have been done if requested. Please have the pharmacy call with any other refills you may need.  Please continue your efforts at being more active, low cholesterol diet, and weight control. You are otherwise up to date with prevention measures today.  Please return if you change your mind about the pneumonia shot  Please go to the LAB in the Basement (turn left off the elevator) for the tests to be done today  You will be contacted by phone if any changes need to be made immediately.  Otherwise, you will receive a letter about your results with an explanation, but please check with MyChart first.  You will be contacted regarding the referral for: [facility] center for the lower back  You should hear later this year about the recall for your colonoscopy (due about October 2015)  Please check your blood pressure on a regular basis; your goal is to be less than 140/90  You may wish to refer yourself to a hearing specialist for your hearing evaluation  Please return in 6 months, or sooner if needed

## 2014-01-30 NOTE — Assessment & Plan Note (Signed)

## 2014-01-30 NOTE — Assessment & Plan Note (Signed)
[place] for referral to ortho

## 2014-01-30 NOTE — Progress Notes (Signed)
Subjective:    Patient ID: Vincent Mcclure, male    DOB: 1932-02-24, 80 y.o.   MRN: [id number]  HPI     Has lost about 95% per pt right hearing loss.   Decilines immunizations again today, and will wait on notification later this yr for colonoscopy. Pt continues to have recurring mid LBP but bowel or bladder change, fever, wt loss,  worsening LE numbness/weakness, gait change or falls, but has bilat LE pain to below the knees, severe at night, some worse over the past yr in the pats yr, pain now 6/10, worse at night.  back brace helps somewhat, and did have attempted cortisone a yr ago to lower back per Dr Grandville Silos surgeon; but did not help. has hx of 2 lumbar surgur per same MD in past.  Would consent to surgury if it was felt he might improved.  Does not want ot return to same MD.   Has not taken BP med yet today Past Medical History  Diagnosis Date  . ANEMIA-NOS 03/05/2008  . BACK PAIN 03/05/2008  . COLONIC POLYPS, HX OF 03/05/2008  . GERD 03/05/2008  . HYPERLIPIDEMIA 03/05/2008  . HYPERTENSION 03/05/2008  . IBS 03/05/2008  . LEG CRAMPS 09/02/2010  . Impaired glucose tolerance 10/12/2011  . PERIPHERAL EDEMA 09/02/2010  . PERIPHERAL NEUROPATHY 03/05/2008  . VARICOSE VEINS, LOWER EXTREMITIES 06/09/2009  . Esophageal stricture     hx of  . Prostatitis     hx of  . PAD (peripheral artery disease) 10/14/2011  . Chronic low back pain 10/14/2011   Past Surgical History  Procedure Laterality Date  . Lumbar spine surgery  Feb 2010    s/p-Dr Joya Salm    reports that he quit smoking about 5 years ago. He does not have any smokeless tobacco history on file. He reports that he drinks alcohol. He reports that he does not use illicit drugs. family history includes Cancer in his mother; Diabetes in his father and sister; Heart disease in his father; Hypertension in his father; Stroke in his father. Allergies  Allergen Reactions  . Lipitor [Atorvastatin Calcium] Other (See Comments)    Myalgia     Current Outpatient Prescriptions on File Prior to Visit  Medication Sig Dispense Refill  . aspirin EC 81 MG tablet Take 81 mg by mouth daily.        Marland Kitchen lisinopril-hydrochlorothiazide (PRINZIDE,ZESTORETIC) 20-25 MG per tablet TAKE ONE TABLET BY MOUTH EVERY DAY  90 tablet  1  . pravastatin (PRAVACHOL) 40 MG tablet TAKE ONE TABLET BY MOUTH EVERY DAY  90 tablet  1  . traMADol (ULTRAM) 50 MG tablet TAKE ONE TO TWO TABLETS BY MOUTH EVERY 6 HOURS AS NEEDED FOR PAIN. MAX  OF  4  TABLETS  DAILY  120 tablet  2   No current facility-administered medications on file prior to visit.   Review of Systems Constitutional: Negative for diaphoresis, activity change, appetite change or unexpected weight change.  HENT: Negative for hearing loss, ear pain, facial swelling, mouth sores and neck stiffness.   Eyes: Negative for pain, redness and visual disturbance.  Respiratory: Negative for shortness of breath and wheezing.   Cardiovascular: Negative for chest pain and palpitations.  Gastrointestinal: Negative for diarrhea, blood in stool, abdominal distention or other pain Genitourinary: Negative for hematuria, flank pain or change in urine volume.  Musculoskeletal: Negative for myalgias and joint swelling.  Skin: Negative for color change and wound.  Neurological: Negative for syncope and numbness. other than noted  Hematological: Negative for adenopathy.  Psychiatric/Behavioral: Negative for hallucinations, self-injury, decreased concentration and agitation.      Objective:   Physical Exam BP 140/102  Pulse 91  Temp(Src) 97 F (36.1 C) (Oral)  Resp 14  Ht 6' (1.829 m)  Wt 240 lb 8 oz (109.09 kg)  BMI 32.61 kg/m2  SpO2 95% VS noted,  Constitutional: Pt is oriented to person, place, and time. Appears well-developed and well-nourished.  Head: Normocephalic and atraumatic.  Right Ear: External ear normal.  Left Ear: External ear normal.  Nose: Nose normal.  Mouth/Throat: Oropharynx is clear and  moist.  Eyes: Conjunctivae and EOM are normal. Pupils are equal, round, and reactive to light.  Neck: Normal range of motion. Neck supple. No JVD present. No tracheal deviation present.  Cardiovascular: Normal rate, regular rhythm, normal heart sounds and intact distal pulses.   Pulmonary/Chest: Effort normal and breath sounds normal.  Abdominal: Soft. Bowel sounds are normal. There is no tenderness. No HSM  Musculoskeletal: Normal range of motion. Exhibits no edema.  Lymphadenopathy:  Has no cervical adenopathy.  Neurological: Pt is alert and oriented to person, place, and time. Pt has normal reflexes. No cranial nerve deficit.  Skin: Skin is warm and dry. No rash noted.  Psychiatric:  Has  normal mood and affect. Behavior is normal.     Assessment & Plan:

## 2014-01-30 NOTE — Progress Notes (Signed)
Pre visit review using our clinic review tool, if applicable. No additional management support is needed unless otherwise documented below in the visit note. 

## 2014-02-26 ENCOUNTER — Encounter: Payer: Self-pay | Admitting: Internal Medicine

## 2014-03-08 ENCOUNTER — Other Ambulatory Visit: Payer: Self-pay

## 2014-03-08 MED ORDER — PRAVASTATIN SODIUM 40 MG PO TABS: ORAL_TABLET | ORAL | Status: DC

## 2014-03-08 MED ORDER — LISINOPRIL-HYDROCHLOROTHIAZIDE 20-25 MG PO TABS: ORAL_TABLET | ORAL | Status: DC

## 2014-03-21 ENCOUNTER — Telehealth: Payer: Self-pay

## 2014-03-21 DIAGNOSIS — M549 Dorsalgia, unspecified: Secondary | ICD-10-CM

## 2014-03-21 NOTE — Telephone Encounter (Signed)
The patient called requesting a referral to Dr. Noemi Chapel.

## 2014-03-21 NOTE — Telephone Encounter (Signed)
Done referral for back pain

## 2014-05-30 ENCOUNTER — Encounter: Payer: Self-pay | Admitting: Gastroenterology

## 2014-07-08 ENCOUNTER — Encounter: Payer: Self-pay | Admitting: Gastroenterology

## 2014-07-09 ENCOUNTER — Telehealth: Payer: Self-pay | Admitting: Internal Medicine

## 2014-07-09 MED ORDER — TRAMADOL HCL 50 MG PO TABS: ORAL_TABLET | ORAL | Status: DC

## 2014-07-09 NOTE — Telephone Encounter (Signed)
Tramadol Done hardcopy to robin

## 2014-07-10 NOTE — Telephone Encounter (Signed)
Faxed hardcopy for Tramadol to [facility] Mail order.

## 2014-11-12 ENCOUNTER — Telehealth: Payer: Self-pay | Admitting: Internal Medicine

## 2014-11-12 MED ORDER — TRAMADOL HCL 50 MG PO TABS: ORAL_TABLET | ORAL | Status: DC

## 2014-11-12 NOTE — Telephone Encounter (Signed)
Done hardcopy to robin  3 mo is limit for rx by law

## 2014-11-12 NOTE — Telephone Encounter (Signed)
Pt request refill for tramadol to be call into [facility] mail order, pt was requesting 6 month supply rather than 3 month.

## 2014-11-13 ENCOUNTER — Emergency Department (HOSPITAL_COMMUNITY): Payer: Commercial Managed Care - HMO

## 2014-11-13 ENCOUNTER — Encounter (HOSPITAL_COMMUNITY): Payer: Self-pay | Admitting: Emergency Medicine

## 2014-11-13 ENCOUNTER — Inpatient Hospital Stay (HOSPITAL_COMMUNITY)
Admission: EM | Admit: 2014-11-13 | Discharge: 2014-11-15 | DRG: 381 | Disposition: A | Discharge status: Home / Self Care | Payer: Commercial Managed Care - HMO | Attending: Internal Medicine | Admitting: Internal Medicine

## 2014-11-13 DIAGNOSIS — Z888 Allergy status to other drugs, medicaments and biological substances status: Secondary | ICD-10-CM

## 2014-11-13 DIAGNOSIS — K210 Gastro-esophageal reflux disease with esophagitis: Secondary | ICD-10-CM | POA: Diagnosis present

## 2014-11-13 DIAGNOSIS — Z87891 Personal history of nicotine dependence: Secondary | ICD-10-CM

## 2014-11-13 DIAGNOSIS — R51 Headache: Secondary | ICD-10-CM | POA: Diagnosis not present

## 2014-11-13 DIAGNOSIS — K209 Esophagitis, unspecified without bleeding: Secondary | ICD-10-CM | POA: Insufficient documentation

## 2014-11-13 DIAGNOSIS — K920 Hematemesis: Secondary | ICD-10-CM | POA: Diagnosis not present

## 2014-11-13 DIAGNOSIS — F1020 Alcohol dependence, uncomplicated: Secondary | ICD-10-CM | POA: Diagnosis not present

## 2014-11-13 DIAGNOSIS — I739 Peripheral vascular disease, unspecified: Secondary | ICD-10-CM | POA: Diagnosis not present

## 2014-11-13 DIAGNOSIS — D62 Acute posthemorrhagic anemia: Secondary | ICD-10-CM | POA: Diagnosis not present

## 2014-11-13 DIAGNOSIS — K253 Acute gastric ulcer without hemorrhage or perforation: Secondary | ICD-10-CM | POA: Insufficient documentation

## 2014-11-13 DIAGNOSIS — K2980 Duodenitis without bleeding: Secondary | ICD-10-CM | POA: Diagnosis present

## 2014-11-13 DIAGNOSIS — R1110 Vomiting, unspecified: Secondary | ICD-10-CM

## 2014-11-13 DIAGNOSIS — K921 Melena: Secondary | ICD-10-CM | POA: Diagnosis present

## 2014-11-13 DIAGNOSIS — N179 Acute kidney failure, unspecified: Secondary | ICD-10-CM | POA: Diagnosis not present

## 2014-11-13 DIAGNOSIS — E785 Hyperlipidemia, unspecified: Secondary | ICD-10-CM | POA: Diagnosis present

## 2014-11-13 DIAGNOSIS — R6889 Other general symptoms and signs: Secondary | ICD-10-CM | POA: Diagnosis not present

## 2014-11-13 DIAGNOSIS — T464X5A Adverse effect of angiotensin-converting-enzyme inhibitors, initial encounter: Secondary | ICD-10-CM | POA: Diagnosis present

## 2014-11-13 DIAGNOSIS — Z7952 Long term (current) use of systemic steroids: Secondary | ICD-10-CM

## 2014-11-13 DIAGNOSIS — M1990 Unspecified osteoarthritis, unspecified site: Secondary | ICD-10-CM | POA: Diagnosis present

## 2014-11-13 DIAGNOSIS — K269 Duodenal ulcer, unspecified as acute or chronic, without hemorrhage or perforation: Secondary | ICD-10-CM | POA: Diagnosis not present

## 2014-11-13 DIAGNOSIS — Z9842 Cataract extraction status, left eye: Secondary | ICD-10-CM | POA: Diagnosis not present

## 2014-11-13 DIAGNOSIS — F1010 Alcohol abuse, uncomplicated: Secondary | ICD-10-CM | POA: Diagnosis not present

## 2014-11-13 DIAGNOSIS — K2210 Ulcer of esophagus without bleeding: Principal | ICD-10-CM | POA: Diagnosis present

## 2014-11-13 DIAGNOSIS — K2950 Unspecified chronic gastritis without bleeding: Secondary | ICD-10-CM | POA: Diagnosis not present

## 2014-11-13 DIAGNOSIS — D500 Iron deficiency anemia secondary to blood loss (chronic): Secondary | ICD-10-CM | POA: Diagnosis present

## 2014-11-13 DIAGNOSIS — H5710 Ocular pain, unspecified eye: Secondary | ICD-10-CM

## 2014-11-13 DIAGNOSIS — H5712 Ocular pain, left eye: Secondary | ICD-10-CM | POA: Diagnosis not present

## 2014-11-13 DIAGNOSIS — Z9841 Cataract extraction status, right eye: Secondary | ICD-10-CM | POA: Diagnosis not present

## 2014-11-13 DIAGNOSIS — K922 Gastrointestinal hemorrhage, unspecified: Secondary | ICD-10-CM | POA: Diagnosis not present

## 2014-11-13 DIAGNOSIS — I10 Essential (primary) hypertension: Secondary | ICD-10-CM | POA: Diagnosis present

## 2014-11-13 DIAGNOSIS — R000 Tachycardia, unspecified: Secondary | ICD-10-CM | POA: Diagnosis not present

## 2014-11-13 HISTORY — DX: Anemia, unspecified: D64.9

## 2014-11-13 HISTORY — DX: Essential (primary) hypertension: I10

## 2014-11-13 HISTORY — DX: Polyp of colon: K63.5

## 2014-11-13 HISTORY — DX: Gastro-esophageal reflux disease without esophagitis: K21.9

## 2014-11-13 LAB — TYPE AND SCREEN
ABO/RH(D): O NEG
Antibody Screen: NEGATIVE

## 2014-11-13 LAB — RAPID URINE DRUG SCREEN, HOSP PERFORMED
Amphetamines: NOT DETECTED
Barbiturates: NOT DETECTED
Benzodiazepines: NOT DETECTED
COCAINE: NOT DETECTED
Opiates: POSITIVE — AB
Tetrahydrocannabinol: NOT DETECTED

## 2014-11-13 LAB — PROTIME-INR
INR: 1.09 (ref 0.00–1.49)
Prothrombin Time: 14.2 seconds (ref 11.6–15.2)

## 2014-11-13 LAB — CBC WITH DIFFERENTIAL/PLATELET
BASOS ABS: 0 10*3/uL (ref 0.0–0.1)
Basophils Relative: 0 % (ref 0–1)
EOS ABS: 0 10*3/uL (ref 0.0–0.7)
EOS PCT: 0 % (ref 0–5)
HCT: 38.2 % — ABNORMAL LOW (ref 39.0–52.0)
HEMOGLOBIN: 12.7 g/dL — AB (ref 13.0–17.0)
LYMPHS PCT: 11 % — AB (ref 12–46)
Lymphs Abs: 0.7 10*3/uL (ref 0.7–4.0)
MCH: 29.1 pg (ref 26.0–34.0)
MCHC: 33.2 g/dL (ref 30.0–36.0)
MCV: 87.6 fL (ref 78.0–100.0)
MONO ABS: 0.5 10*3/uL (ref 0.1–1.0)
Monocytes Relative: 7 % (ref 3–12)
NEUTROS ABS: 5.4 10*3/uL (ref 1.7–7.7)
Neutrophils Relative %: 82 % — ABNORMAL HIGH (ref 43–77)
PLATELETS: 196 10*3/uL (ref 150–400)
RBC: 4.36 MIL/uL (ref 4.22–5.81)
RDW: 13.3 % (ref 11.5–15.5)
WBC: 6.7 10*3/uL (ref 4.0–10.5)

## 2014-11-13 LAB — ETHANOL: Alcohol, Ethyl (B): 21 mg/dL — ABNORMAL HIGH (ref 0–9)

## 2014-11-13 LAB — COMPREHENSIVE METABOLIC PANEL
ALBUMIN: 3.8 g/dL (ref 3.5–5.2)
ALK PHOS: 60 U/L (ref 39–117)
ALT: 19 U/L (ref 0–53)
AST: 21 U/L (ref 0–37)
Anion gap: 11 (ref 5–15)
BUN: 50 mg/dL — ABNORMAL HIGH (ref 6–23)
CO2: 22 mmol/L (ref 19–32)
CREATININE: 1.33 mg/dL (ref 0.50–1.35)
Calcium: 9.1 mg/dL (ref 8.4–10.5)
Chloride: 102 mmol/L (ref 96–112)
GFR calc Af Amer: 57 mL/min — ABNORMAL LOW (ref >90)
GFR, EST NON AFRICAN AMERICAN: 49 mL/min — AB (ref >90)
Glucose, Bld: 150 mg/dL — ABNORMAL HIGH (ref 70–99)
POTASSIUM: 3.9 mmol/L (ref 3.5–5.1)
SODIUM: 135 mmol/L (ref 135–145)
TOTAL PROTEIN: 6.1 g/dL (ref 6.0–8.3)
Total Bilirubin: 0.6 mg/dL (ref 0.3–1.2)

## 2014-11-13 LAB — I-STAT TROPONIN, ED: TROPONIN I, POC: 0 ng/mL (ref 0.00–0.08)

## 2014-11-13 LAB — ABO/RH: ABO/RH(D): O NEG

## 2014-11-13 LAB — POC OCCULT BLOOD, ED: FECAL OCCULT BLD: POSITIVE — AB

## 2014-11-13 MED ORDER — ONDANSETRON HCL 4 MG/2ML IJ SOLN: 4.0000 mg | Freq: Three times a day (TID) | INTRAMUSCULAR | Status: AC | PRN

## 2014-11-13 MED ORDER — HYDROMORPHONE HCL 1 MG/ML IJ SOLN
1.0000 mg | INTRAMUSCULAR | Status: AC | PRN
  Administered 2014-11-14 (×2): 1 mg via INTRAVENOUS
  Filled 2014-11-13 (×2): qty 1

## 2014-11-13 MED ORDER — SODIUM CHLORIDE 0.9 % IV SOLN: 80.0000 mg | Freq: Once | INTRAVENOUS | Status: DC

## 2014-11-13 MED ORDER — SODIUM CHLORIDE 0.9 % IV SOLN: 8.0000 mg/h | INTRAVENOUS | Status: DC

## 2014-11-13 MED ORDER — PROPARACAINE HCL 0.5 % OP SOLN
1.0000 [drp] | Freq: Once | OPHTHALMIC | Status: AC
  Administered 2014-11-13: 1 [drp] via OPHTHALMIC
  Filled 2014-11-13: qty 15

## 2014-11-13 MED ORDER — MORPHINE SULFATE 4 MG/ML IJ SOLN
4.0000 mg | Freq: Once | INTRAMUSCULAR | Status: AC
  Administered 2014-11-13: 4 mg via INTRAVENOUS
  Filled 2014-11-13: qty 1

## 2014-11-13 MED ORDER — SODIUM CHLORIDE 0.9 % IV BOLUS (SEPSIS)
1000.0000 mL | Freq: Once | INTRAVENOUS | Status: AC
  Administered 2014-11-13: 1000 mL via INTRAVENOUS

## 2014-11-13 MED ORDER — SODIUM CHLORIDE 0.9 % IV SOLN
80.0000 mg | Freq: Once | INTRAVENOUS | Status: AC
  Administered 2014-11-13: 80 mg via INTRAVENOUS
  Filled 2014-11-13: qty 80

## 2014-11-13 MED ORDER — PANTOPRAZOLE SODIUM 40 MG IV SOLR: 40.0000 mg | Freq: Two times a day (BID) | INTRAVENOUS | Status: DC

## 2014-11-13 NOTE — ED Provider Notes (Signed)
CSN: [id number]     Arrival date & time 11/13/14  1943 History   First MD Initiated Contact with Patient 11/13/14 1949     Chief Complaint  Patient presents with  . Eye Pain  . Melena     (Consider location/radiation/quality/duration/timing/severity/associated sxs/prior Treatment) HPI   This is an 81 year old man who presents the emergency room with chief complaint of eye pain, melena and hematemesis. The patient complains of pain in the left eye which she states is severe, sharp, stabbing and constant. He states he has had intermittent bouts of pain behind left eye. However, today around 11 AM he does develop constant pain which has had been progressively worsening throughout the day and is now excruciating. He denies changes in vision, photophobia, pain with eye movement or injury to the eye. History of previous cataract removal surgery. He denies headache, nausea. The patient also complains of black tarry stools. He states. that he has had them previously. However, he has had repeated bouts of bloody stools today. He had one episode of vomiting and reviewed, which was described as coffee ground emesis. Patient's wife states that he drinks a lot. The patient did have 3 shots of vodka and cranberry tonight.Patient denies taking any blood thinners, he denies he uses any NSAIDs or aspirin.   Past Medical History  Diagnosis Date  . Hypertension    Past Surgical History  Procedure Laterality Date  . Cataract extraction  bilat   History reviewed. No pertinent family history. History  Substance Use Topics  . Smoking status: Former Research scientist (life sciences)  . Smokeless tobacco: Not on file  . Alcohol Use: Yes    Review of Systems  Ten systems reviewed and are negative for acute change, except as noted in the HPI.    Allergies  Review of patient's allergies indicates no known allergies.  Home Medications   Prior to Admission medications   Not on File   BP 145/75 mmHg  Pulse 95  Temp(Src) 98.5 F  (36.9 C) (Oral)  Resp 16  Ht 6' (1.829 m)  Wt 226 lb (102.513 kg)  BMI 30.64 kg/m2  SpO2 97% Physical Exam  Constitutional: He appears well-developed and well-nourished. No distress.  HENT:  Head: Normocephalic and atraumatic.    Eyes: EOM are normal. Pupils are equal, round, and reactive to light. Right conjunctiva is injected. Left conjunctiva is injected. No scleral icterus.  Pressure OD:17 OS: 17  Neck: Normal range of motion. Neck supple.  Cardiovascular: Normal rate, regular rhythm and normal heart sounds.   Pulmonary/Chest: Effort normal and breath sounds normal. No respiratory distress.  Abdominal: Soft. There is no tenderness.  Musculoskeletal: He exhibits no edema.  Neurological: He is alert.  Skin: Skin is warm and dry. He is not diaphoretic.  Psychiatric: His behavior is normal.  Nursing note and vitals reviewed.   ED Course  Procedures (including critical care time) Labs Review Labs Reviewed  CBC WITH DIFFERENTIAL/PLATELET - Abnormal; Notable for the following:    Hemoglobin 12.7 (*)    HCT 38.2 (*)    Neutrophils Relative % 82 (*)    Lymphocytes Relative 11 (*)    All other components within normal limits  COMPREHENSIVE METABOLIC PANEL  OCCULT BLOOD X 1 CARD TO LAB, STOOL  PROTIME-INR  I-STAT TROPOININ, ED  TYPE AND SCREEN    Imaging Review No results found.   EKG Interpretation   Date/Time:  Wednesday November 13 2014 19:48:05 EST Ventricular Rate:  116 PR Interval:  158 QRS  Duration: 86 QT Interval:  324 QTC Calculation: 450 R Axis:   62 Text Interpretation:  Sinus tachycardia with irregular rate Nonspecific T  abnormalities, lateral leads Confirmed by Zenia Resides  MD, ANTHONY ([id number]) on  11/13/2014 9:07:07 PM      CRITICAL CARE Performed by: Margarita Mail   Total critical care time: 40  Critical care time was exclusive of separately billable procedures and treating other patients.  Critical care was necessary to treat or prevent  imminent or life-threatening deterioration.  Critical care was time spent personally by me on the following activities: development of treatment plan with patient and/or surrogate as well as nursing, discussions with consultants, evaluation of patient's response to treatment, examination of patient, obtaining history from patient or surrogate, ordering and performing treatments and interventions, ordering and review of laboratory studies, ordering and review of radiographic studies, pulse oximetry and re-evaluation of patient's condition.   MDM   Final diagnoses:  Vomiting  Eye pain    Patient here with eye pain , clear evidence of dried blood in his. From vomitus. His visual acuity and pressures are normal. His pain is improved with medication. The patient has steadily increasing tachycardia and begin here at pulse of 95 and is now tachycardic into the 130s. He has visible melanotic stool around the rectum and positive fecal occult. I'm concerned for a brisk GI bleed. Given the patient's history of alcohol intake. His BUN is 50 with a normal creatinine. Patient is receiving IV fluids. Pain medication, CT scan of the head shows no intracranial abnormality. Do not have a definitive diagnosis for the acute eye pain. The patient will need admission.   11:34 PM BP 117/73 mmHg  Pulse 125  Temp(Src) 98.5 F (36.9 C) (Oral)  Resp 28  Ht 6' (1.829 m)  Wt 226 lb (102.513 kg)  BMI 30.64 kg/m2  SpO2 95% Patient with GI bleed. I have placed the patient on proton asked bolus with IV. I spoken with Dr. He is on call for [facility]. He will see the patient in the morning. The patient is stable throughout his visit, although he has steadily increasing tachycardia  And I'm concerned for a brisk GI bleed. Patient is receiving IV fluids. I spoken with Dr. Arnoldo Morale will admit the patient to the stepdown unit. Patient is alert and oriented. He is stable throughout his emergency department visit without any  significant deterioration. Patient appears safe for admission at this time.  Margarita Mail, PA-C 11/15/14 1406  Leota Jacobsen, MD 11/18/14 0800

## 2014-11-13 NOTE — ED Notes (Signed)
Tonopen at bedside.

## 2014-11-13 NOTE — ED Notes (Signed)
Harris, PA at bedside.

## 2014-11-13 NOTE — Telephone Encounter (Signed)
Called the patient informed of MD instructions on tramadol and faxed hardcopy to [facility] mail order.  Left a message on patients phone.

## 2014-11-13 NOTE — ED Notes (Signed)
Arnoldo Morale, MD at bedside.

## 2014-11-13 NOTE — ED Notes (Signed)
Per EMS, pt started having severe pain behind his left eye. Pt reports it as sharp and stabbing. Pt took toradol and flexerill with no relief. Pt also reporting black stools today. Pt with irregular EKG, but EMS states that he has a hx of the same. Pt also with sudden onset of emesis. Pt received 4mg  Zofran en route. EMS states that the pt's vomit appeared bloody, however the pr reports having a few vodka and cranberries with dinner this evening. CBG was 156.

## 2014-11-13 NOTE — ED Provider Notes (Signed)
Medical screening examination/treatment/procedure(s) were conducted as a shared visit with non-physician practitioner(s) and myself.  I personally evaluated the patient during the encounter.   EKG Interpretation   Date/Time:  Wednesday November 13 2014 19:48:05 EST Ventricular Rate:  116 PR Interval:  158 QRS Duration: 86 QT Interval:  324 QTC Calculation: 450 R Axis:   62 Text Interpretation:  Sinus tachycardia with irregular rate Nonspecific T  abnormalities, lateral leads Confirmed by Avyaan Summer  MD, Mackie Goon ([id number]) on  11/13/2014 9:07:07 PM     Patient here with black stools for abdominal pain and hematemesis. IV fluids ordered. Patient noted tachycardia. GI consult obtained and patient will be admitted to the hospitalist service.  Leota Jacobsen, MD 11/13/14 2325

## 2014-11-14 ENCOUNTER — Encounter (HOSPITAL_COMMUNITY)
Admission: EM | Disposition: A | Discharge status: Home / Self Care | Payer: Self-pay | Source: Home / Self Care | Attending: Internal Medicine

## 2014-11-14 ENCOUNTER — Encounter (HOSPITAL_COMMUNITY): Payer: Self-pay | Admitting: Physician Assistant

## 2014-11-14 DIAGNOSIS — I10 Essential (primary) hypertension: Secondary | ICD-10-CM

## 2014-11-14 DIAGNOSIS — H5712 Ocular pain, left eye: Secondary | ICD-10-CM

## 2014-11-14 DIAGNOSIS — H5710 Ocular pain, unspecified eye: Secondary | ICD-10-CM | POA: Insufficient documentation

## 2014-11-14 DIAGNOSIS — F1010 Alcohol abuse, uncomplicated: Secondary | ICD-10-CM

## 2014-11-14 DIAGNOSIS — E785 Hyperlipidemia, unspecified: Secondary | ICD-10-CM

## 2014-11-14 DIAGNOSIS — K269 Duodenal ulcer, unspecified as acute or chronic, without hemorrhage or perforation: Secondary | ICD-10-CM | POA: Insufficient documentation

## 2014-11-14 DIAGNOSIS — D62 Acute posthemorrhagic anemia: Secondary | ICD-10-CM | POA: Diagnosis present

## 2014-11-14 DIAGNOSIS — K209 Esophagitis, unspecified without bleeding: Secondary | ICD-10-CM | POA: Insufficient documentation

## 2014-11-14 DIAGNOSIS — K253 Acute gastric ulcer without hemorrhage or perforation: Secondary | ICD-10-CM | POA: Insufficient documentation

## 2014-11-14 DIAGNOSIS — K922 Gastrointestinal hemorrhage, unspecified: Secondary | ICD-10-CM

## 2014-11-14 HISTORY — PX: ESOPHAGOGASTRODUODENOSCOPY: SHX5428

## 2014-11-14 LAB — BASIC METABOLIC PANEL
ANION GAP: 4 — AB (ref 5–15)
BUN: 43 mg/dL — ABNORMAL HIGH (ref 6–23)
CO2: 27 mmol/L (ref 19–32)
Calcium: 8.3 mg/dL — ABNORMAL LOW (ref 8.4–10.5)
Chloride: 104 mmol/L (ref 96–112)
Creatinine, Ser: 1.07 mg/dL (ref 0.50–1.35)
GFR, EST AFRICAN AMERICAN: 74 mL/min — AB (ref >90)
GFR, EST NON AFRICAN AMERICAN: 64 mL/min — AB (ref >90)
Glucose, Bld: 117 mg/dL — ABNORMAL HIGH (ref 70–99)
Potassium: 3.6 mmol/L (ref 3.5–5.1)
Sodium: 135 mmol/L (ref 135–145)

## 2014-11-14 LAB — CBC
HCT: 34.1 % — ABNORMAL LOW (ref 39.0–52.0)
Hemoglobin: 11.4 g/dL — ABNORMAL LOW (ref 13.0–17.0)
MCH: 28.8 pg (ref 26.0–34.0)
MCHC: 33.4 g/dL (ref 30.0–36.0)
MCV: 86.1 fL (ref 78.0–100.0)
PLATELETS: 183 10*3/uL (ref 150–400)
RBC: 3.96 MIL/uL — ABNORMAL LOW (ref 4.22–5.81)
RDW: 13.3 % (ref 11.5–15.5)
WBC: 5.8 10*3/uL (ref 4.0–10.5)

## 2014-11-14 LAB — HEMOGLOBIN AND HEMATOCRIT, BLOOD
HCT: 33.1 % — ABNORMAL LOW (ref 39.0–52.0)
HCT: 35.6 % — ABNORMAL LOW (ref 39.0–52.0)
Hemoglobin: 11 g/dL — ABNORMAL LOW (ref 13.0–17.0)
Hemoglobin: 11.8 g/dL — ABNORMAL LOW (ref 13.0–17.0)

## 2014-11-14 LAB — MRSA PCR SCREENING: MRSA by PCR: NEGATIVE

## 2014-11-14 SURGERY — EGD (ESOPHAGOGASTRODUODENOSCOPY)
Anesthesia: Moderate Sedation

## 2014-11-14 MED ORDER — THIAMINE HCL 100 MG/ML IJ SOLN
Freq: Once | INTRAVENOUS | Status: AC
  Administered 2014-11-14: 01:00:00 via INTRAVENOUS
  Filled 2014-11-14: qty 1000

## 2014-11-14 MED ORDER — BUTAMBEN-TETRACAINE-BENZOCAINE 2-2-14 % EX AERO
INHALATION_SPRAY | CUTANEOUS | Status: DC | PRN
  Administered 2014-11-14: 2 via TOPICAL

## 2014-11-14 MED ORDER — LORAZEPAM 2 MG/ML IJ SOLN: 2.0000 mg | INTRAMUSCULAR | Status: DC | PRN

## 2014-11-14 MED ORDER — ACETAMINOPHEN 325 MG PO TABS: 650.0000 mg | ORAL_TABLET | Freq: Four times a day (QID) | ORAL | Status: DC | PRN

## 2014-11-14 MED ORDER — PANTOPRAZOLE SODIUM 40 MG PO TBEC
40.0000 mg | DELAYED_RELEASE_TABLET | Freq: Two times a day (BID) | ORAL | Status: DC
  Administered 2014-11-14 – 2014-11-15 (×2): 40 mg via ORAL
  Filled 2014-11-14 (×2): qty 1

## 2014-11-14 MED ORDER — OXYCODONE HCL 5 MG PO TABS
5.0000 mg | ORAL_TABLET | ORAL | Status: DC | PRN
  Administered 2014-11-14 – 2014-11-15 (×5): 5 mg via ORAL
  Filled 2014-11-14 (×5): qty 1

## 2014-11-14 MED ORDER — MIDAZOLAM HCL 10 MG/2ML IJ SOLN
INTRAMUSCULAR | Status: DC | PRN
  Administered 2014-11-14: 2 mg via INTRAVENOUS
  Administered 2014-11-14: 1 mg via INTRAVENOUS

## 2014-11-14 MED ORDER — FENTANYL CITRATE 0.05 MG/ML IJ SOLN
INTRAMUSCULAR | Status: DC | PRN
  Administered 2014-11-14: 25 ug via INTRAVENOUS

## 2014-11-14 MED ORDER — ONDANSETRON HCL 4 MG PO TABS: 4.0000 mg | ORAL_TABLET | Freq: Four times a day (QID) | ORAL | Status: DC | PRN

## 2014-11-14 MED ORDER — HYDROMORPHONE HCL 1 MG/ML IJ SOLN
0.5000 mg | INTRAMUSCULAR | Status: DC | PRN
  Administered 2014-11-15: 0.5 mg via INTRAVENOUS
  Filled 2014-11-14: qty 1

## 2014-11-14 MED ORDER — SODIUM CHLORIDE 0.9 % IV SOLN: INTRAVENOUS | Status: DC

## 2014-11-14 MED ORDER — ACETAMINOPHEN 650 MG RE SUPP: 650.0000 mg | Freq: Four times a day (QID) | RECTAL | Status: DC | PRN

## 2014-11-14 MED ORDER — SODIUM CHLORIDE 0.9 % IV SOLN
INTRAVENOUS | Status: DC
  Administered 2014-11-14: 75 mL/h via INTRAVENOUS

## 2014-11-14 MED ORDER — ONDANSETRON HCL 4 MG/2ML IJ SOLN: 4.0000 mg | Freq: Four times a day (QID) | INTRAMUSCULAR | Status: DC | PRN

## 2014-11-14 NOTE — Op Note (Signed)
[facility] [place], [address]   ENDOSCOPY PROCEDURE REPORT  PATIENT: Vincent, Mcclure  MR#: [id number] BIRTHDATE: 1932/07/09 , 82  yrs. old GENDER: male ENDOSCOPIST: Jerene Bears, MD REFERRED BY:  [facility] PROCEDURE DATE:  11/14/2014 PROCEDURE:  EGD w/ biopsy for H.pylori ASA CLASS:     Class III INDICATIONS:  hematemesis. MEDICATIONS: Fentanyl 25 mcg IV and Versed 3 mg IV TOPICAL ANESTHETIC: Cetacaine Spray  DESCRIPTION OF PROCEDURE: After the risks benefits and alternatives of the procedure were thoroughly explained, informed consent was obtained.  The Pentax Gastroscope [id number] endoscope was introduced through the mouth and advanced to the second portion of the duodenum , Without limitations.  The instrument was slowly withdrawn as the mucosa was fully examined.   ESOPHAGUS: Moderately severe reflux esophagitis was found in the lower third of the esophagus. There was a clean-based esophageal ulcer just proximal to the GE junction.  no esophageal varices seen   STOMACH: A medium sized non-bleeding, clean-based and round ulcer was found in the gastric antrum.   Mild antral gastritis. Multiple biopsies performed from the gastric body, antrum and incisura to exclude H. pylori  DUODENUM: Severe duodenal inflammation with superficial ulceration was found in the duodenal bulb. Normal-appearing second portion of the duodenum and the examined segments  Retroflexed views revealed no abnormalities.     The scope was then withdrawn from the patient and the procedure completed.  COMPLICATIONS: There were no immediate complications.  ENDOSCOPIC IMPRESSION: 1.   Esophagitis with ulceration in the lower third of the esophagus  2.   Medium sized ulcer was found in the gastric antrum; multiple biopsies 3.   Duodenal inflammation with superficial ulceration was found in the duodenal bulb  RECOMMENDATIONS: 1.  Twice daily PPI for  at least 8 weeks, then daily thereafter 2.  Alcohol avoidance given formation seen in the esophagus, stomach and small bowel today 3.  Await pathology results 4.  Avoid all NSAIDs  eSigned:  Jerene Bears, MD 11/14/2014 5:03 PM     CC:The Patient

## 2014-11-14 NOTE — Progress Notes (Signed)
[facility] PROGRESS NOTE  Vincent Mcclure YQI:[id number] DOB: [birth year] DOA: 11/13/2014 PCP: Cathlean Cower, MD  Assessment/Plan: 81 y/o male with PMH of HTN, HPL, DJD on chronic naproxen, alcoholism presented with GIB  1. GIB, likely due to NSAIDS; no new episode of bleeding  -cont PPI, NPO, EGD per gastroenterologist today; d/c nsaids  2. Mild acute mild blood loss anemia due to GIB; Hg 127-->11.4; cont monitor Hg, TF prn  3. Alcoholism, patient denies h/o withdrawals; cont monitoring on CIWA; counseled to to drinking etoh     Code Status: full Family Communication: d/w patient, his wife (indicate person spoken with, relationship, and if by phone, the number) Disposition Plan: home pend clinical improvement    Consultants:  GI  Procedures:  Pend EGD  Antibiotics:  none (indicate start date, and stop date if known)  HPI/Subjective: Alert, oriented   Objective: Filed Vitals:   11/14/14 1200  BP: 141/88  Pulse: 98  Temp: 97.7 F (36.5 C)  Resp: 16    Intake/Output Summary (Last 24 hours) at 11/14/14 1415 Last data filed at 11/14/14 0833  Gross per 24 hour  Intake      0 ml  Output    500 ml  Net   -500 ml   Filed Weights   11/13/14 1952 11/14/14 1200  Weight: 102.513 kg (226 lb) 105.4 kg (232 lb 5.8 oz)    Exam:   General:  alert  Cardiovascular: s1,s2 rrr  Respiratory: CTA BL  Abdomen: soft, nt, nd  Musculoskeletal: no LE edema   Data Reviewed: Basic Metabolic Panel:  Recent Labs Lab 11/13/14 2016 11/14/14 0534  NA 135 135  K 3.9 3.6  CL 102 104  CO2 22 27  GLUCOSE 150* 117*  BUN 50* 43*  CREATININE 1.33 1.07  CALCIUM 9.1 8.3*   Liver Function Tests:  Recent Labs Lab 11/13/14 2016  AST 21  ALT 19  ALKPHOS 60  BILITOT 0.6  PROT 6.1  ALBUMIN 3.8   No results for input(s): LIPASE, AMYLASE in the last 168 hours. No results for input(s): AMMONIA in the last 168 hours. CBC:  Recent Labs Lab 11/13/14 2016 11/14/14 0021  11/14/14 0534  WBC 6.7  --  5.8  NEUTROABS 5.4  --   --   HGB 12.7* 11.8* 11.4*  HCT 38.2* 35.6* 34.1*  MCV 87.6  --  86.1  PLT 196  --  183   Cardiac Enzymes: No results for input(s): CKTOTAL, CKMB, CKMBINDEX, TROPONINI in the last 168 hours. BNP (last 3 results) No results for input(s): BNP in the last 8760 hours.  ProBNP (last 3 results) No results for input(s): PROBNP in the last 8760 hours.  CBG: No results for input(s): GLUCAP in the last 168 hours.  Recent Results (from the past 240 hour(s))  MRSA PCR Screening     Status: None   Collection Time: 11/14/14 11:59 AM  Result Value Ref Range Status   MRSA by PCR NEGATIVE NEGATIVE Final    Comment:        The GeneXpert MRSA Assay (FDA approved for NASAL specimens only), is one component of a comprehensive MRSA colonization surveillance program. It is not intended to diagnose MRSA infection nor to guide or monitor treatment for MRSA infections.      Studies: Dg Chest 2 View  11/13/2014   CLINICAL DATA:  Severe left periorbital pain, chronic in nature. Vomiting and tarry stools. Initial encounter.  EXAM: CHEST  2 VIEW  COMPARISON:  None.  FINDINGS: The lungs are well-aerated and clear. There is no evidence of focal opacification, pleural effusion or pneumothorax. Apparent calcified granulomata are seen near the right lung apex, measuring up to 5 mm in size.  The heart is normal in size; the mediastinal contour is within normal limits. No acute osseous abnormalities are seen.  IMPRESSION: No acute cardiopulmonary process seen.   Electronically Signed   By: Garald Balding M.D.   On: 11/13/2014 21:18   Ct Head Wo Contrast  11/13/2014   CLINICAL DATA:  Severe pain behind the left eye, acute onset. Initial encounter.  EXAM: CT HEAD WITHOUT CONTRAST  TECHNIQUE: Contiguous axial images were obtained from the base of the skull through the vertex without intravenous contrast.  COMPARISON:  None.  FINDINGS: There is no evidence of  acute infarction, mass lesion, or intra- or extra-axial hemorrhage on CT.  Prominence of the ventricles and sulci reflects mild cortical volume loss. Mild cerebellar atrophy is noted. Mild periventricular white matter change likely reflects small vessel ischemic microangiopathy. A small chronic infarct is noted at the right frontal lobe.  The brainstem and fourth ventricle are within normal limits. The basal ganglia are unremarkable in appearance. The cerebral hemispheres demonstrate grossly normal gray-white differentiation. No mass effect or midline shift is seen.  There is no evidence of fracture; visualized osseous structures are unremarkable in appearance. The orbits are within normal limits. The paranasal sinuses and mastoid air cells are well-aerated. No significant soft tissue abnormalities are seen.  IMPRESSION: 1. No acute intracranial pathology seen on CT. 2. Mild cortical volume loss and scattered small vessel ischemic microangiopathy. 3. 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   Electronically Signed   By: Garald Balding M.D.   On: 11/13/2014 22:35    Scheduled Meds: . [START ON 11/17/2014] pantoprazole (PROTONIX) IV  40 mg Intravenous Q12H   Continuous Infusions: . sodium chloride 75 mL/hr (11/14/14 0748)    Principal Problem:   Upper GI bleed Active Problems:   GI bleed   Essential hypertension   Anemia due to blood loss, acute   Alcohol abuse   Hyperlipidemia    Time spent: >35 minutes     Kinnie Feil  [facility] Pager [phone]. If 7PM-7AM, please contact night-coverage at [email or web address], password [facility] 11/14/2014, 2:15 PM  LOS: 1 day

## 2014-11-14 NOTE — ED Notes (Signed)
GI at bedside

## 2014-11-14 NOTE — Progress Notes (Signed)
[facility] Consult: 9:00 AM 11/14/2014  LOS: 1 day    Referring Provider: Dr Daleen Bo  Primary Care Physician:  Cathlean Cower, MD Primary Gastroenterologist:  Dr. Sharlett Iles.     Reason for Consultation:  GI bleed, anemia.    HPI: Vincent Mcclure is a 81 y.o. male.  Hx GERD, peptic  Esophageal strictures, both HP and adenomatous colon polyps, hemorrhoids.  Hx PAD, anemia, chronic back pain and s/p lumbar surgery. Glucose intolerance.   Pt drinks 4 to 6 ETOH beverages per day.  No hx of liver disease.  Takes naprosyn 2 per day, ASA once per week.  Had a loose, black stool x 2 in AM yesterday.  At same time had pain/pressure in/behind left eye but no blurry vision.  3:30 PM at [facility] had 2 vodka cocktails, felt nauseated, weak, faint, went home.  Early evening the eye pain was severe and called EMS.  Revealed to them the hx of black stools and had one episode dark emesis at their arrival to his home.  No further n/v or dark stools since.     Hgb 12.7 on arrival, 11.4 this AM. Baseline Hgb from 2013 is 15.5. Normal platelets and MCV.  BUN of 50. Coags normal.   CT head shows microvascular disease but nothing to explain eye pain.    Pt says no issues with appetite, nausea, heartburn, weight loss, dysphagia, altered bowel habits in recent months.  Takes Omeprazole daily.    ENDOSCOPIC STUDIES: 1994  EGD for hx peptic stricture and recurrent dysphagia Findings: Healing distal esophageal erosions with inflammatory nodules, peptic stricture at GEJx was dilated, 4 to 5 cm HH.   Hx hyper plastic and adenomatous polyps on flex sig and colonoscopy dating back to 1986.   07/2009 Colonoscopy and 05/2003 Colonoscopy both with HP polyps in sigmoid and rectum, internal hemorrhoids. Last found adenomatous polyps were 1990.     Past Medical History  Diagnosis Date  . Hypertension   . GERD (gastroesophageal reflux disease) 1994    associated peptic strictures  . Colon polyps     hyperplastic (2004, 2010) and adenomatous (1990).    Marland Kitchen PAD (peripheral artery disease) 2013  . Anemia     Past Surgical History  Procedure Laterality Date  . Cataract extraction  bilat  . Lumbar spine surgery  11/2008    Dr Joya Salm    Prior to Admission medications   Medication Sig Start Date End Date Taking? Authorizing Provider  lisinopril-hydrochlorothiazide (PRINZIDE,ZESTORETIC) 20-25 MG per tablet Take 1 tablet by mouth daily.  08/21/14  Yes Historical Provider, MD  pravastatin (PRAVACHOL) 40 MG tablet Take 40 mg by mouth daily.  08/21/14  Yes Historical Provider, MD  traMADol (ULTRAM) 50 MG tablet Take 50 mg by mouth every 6 (six) hours as needed for moderate pain.  10/30/14  Yes Historical Provider, MD  Omeprazole 20 mg                 Once daily.     Scheduled Meds: . [START ON 11/17/2014] pantoprazole (PROTONIX)  IV  40 mg Intravenous Q12H   Infusions: . sodium chloride 75 mL/hr (11/14/14 0748)   PRN Meds: acetaminophen **OR** acetaminophen, HYDROmorphone (DILAUDID) injection, HYDROmorphone (DILAUDID) injection, LORazepam, ondansetron (ZOFRAN) IV, ondansetron **OR** ondansetron (ZOFRAN) IV, oxyCODONE   Allergies as of 11/13/2014  . (No Known Allergies)    History reviewed. No pertinent family history.  History   Social History  . Marital Status: Married    Spouse Name: N/A    Number of Children: N/A  . Years of Education: N/A   Occupational History  . Not on file.   Social History Main Topics  . Smoking status: Former Research scientist (life sciences)  . Smokeless tobacco: Not on file  . Alcohol Use: Yes  . Drug Use: Not on file  . Sexual Activity: Not on file   Other Topics Concern  . Not on file   Social History Narrative    REVIEW OF SYSTEMS: Constitutional:  No weigh loss.  No weakness ENT:  No nose  bleeds Pulm:  No DOE, no PND.  No cough CV:  No palpitations, no LE edema.  GU:  No hematuria, no frequency GI:  Per HPI Heme:  No recall of being on iron.  Bruises easily but no other bleeding issues   Transfusions:  none Neuro:  No headaches, no peripheral tingling or numbness Derm:  No itching, no rash or sores.  Endocrine:  No sweats or chills.  No polyuria or dysuria Immunization:  No recorded immunizations.  Travel:  None beyond local counties in last few months.    PHYSICAL EXAM: Vital signs in last 24 hours: Filed Vitals:   11/14/14 0755  BP:   Pulse:   Temp: 97.9 F (36.6 C)  Resp:    Wt Readings from Last 3 Encounters:  11/13/14 226 lb (102.513 kg)    General: pleasant, overweight elderly WM. Head:  No asymmetry or swelling  Eyes:  No icterus.  + reddened sclera on left. No conjunctival pallor.  EOMI.  PERRL Ears:  HOH.  Hearing aid on left  Nose:  No congestion or discharge Mouth:  Mucosa clear.  No lesions.   Neck:  No mass, no TMG Lungs:  Clear bil.  No SOB or cough Heart: RRR.  No MRG Abdomen:  Soft, NT, ND.  No mass, bruits or HSM.   Rectal: deferred .  FOBT + on ED rectal exam  Musc/Skeltl: no joint swelling or redness Extremities:  1+ pedal edema bil.    Neurologic:  Oriented x 3.  No tremor, no limb weakness.  Skin:  No telangectasia, sores or rash.  Some purpura on forearms Tattoos:  none Nodes:  No cervical adenopathy.   Psych:  Pleasant.  Apropriate.  Garrulous and talkative.   Intake/Output from previous day:   Intake/Output this shift: Total I/O In: -  Out: 500 [Urine:500]  LAB RESULTS:  Recent Labs  11/13/14 2016 11/14/14 0021 11/14/14 0534  WBC 6.7  --  5.8  HGB 12.7* 11.8* 11.4*  HCT 38.2* 35.6* 34.1*  PLT 196  --  183   BMET Lab Results  Component Value Date   NA 135 11/14/2014   NA 135 11/13/2014   K 3.6 11/14/2014   K 3.9 11/13/2014   CL 104 11/14/2014   CL 102 11/13/2014   CO2 27 11/14/2014   CO2 22 11/13/2014    GLUCOSE 117* 11/14/2014   GLUCOSE 150* 11/13/2014   BUN 43* 11/14/2014   BUN 50* 11/13/2014   CREATININE 1.07 11/14/2014  CREATININE 1.33 11/13/2014   CALCIUM 8.3* 11/14/2014   CALCIUM 9.1 11/13/2014   LFT  Recent Labs  11/13/14 2016  PROT 6.1  ALBUMIN 3.8  AST 21  ALT 19  ALKPHOS 60  BILITOT 0.6   PT/INR Lab Results  Component Value Date   INR 1.09 11/13/2014   Hepatitis Panel No results for input(s): HEPBSAG, HCVAB, HEPAIGM, HEPBIGM in the last 72 hours. C-Diff No components found for: CDIFF Lipase  No results found for: LIPASE  Drugs of Abuse     Component Value Date/Time   LABOPIA POSITIVE* 11/13/2014 [place] DETECTED 11/13/2014 2258   LABBENZ NONE DETECTED 11/13/2014 2258   AMPHETMU NONE DETECTED 11/13/2014 2258   THCU NONE DETECTED 11/13/2014 2258   LABBARB NONE DETECTED 11/13/2014 2258     RADIOLOGY STUDIES: Dg Chest 2 View  11/13/2014   CLINICAL DATA:  Severe left periorbital pain, chronic in nature. Vomiting and tarry stools. Initial encounter.  EXAM: CHEST  2 VIEW  COMPARISON:  None.  FINDINGS: The lungs are well-aerated and clear. There is no evidence of focal opacification, pleural effusion or pneumothorax. Apparent calcified granulomata are seen near the right lung apex, measuring up to 5 mm in size.  The heart is normal in size; the mediastinal contour is within normal limits. No acute osseous abnormalities are seen.  IMPRESSION: No acute cardiopulmonary process seen.   Electronically Signed   By: Garald Balding M.D.   On: 11/13/2014 21:18   Ct Head Wo Contrast  11/13/2014   CLINICAL DATA:  Severe pain behind the left eye, acute onset. Initial encounter.  EXAM: CT HEAD WITHOUT CONTRAST  TECHNIQUE: Contiguous axial images were obtained from the base of the skull through the vertex without intravenous contrast.  COMPARISON:  None.  FINDINGS: There is no evidence of acute infarction, mass lesion, or intra- or extra-axial hemorrhage on CT.   Prominence of the ventricles and sulci reflects mild cortical volume loss. Mild cerebellar atrophy is noted. Mild periventricular white matter change likely reflects small vessel ischemic microangiopathy. A small chronic infarct is noted at the right frontal lobe.  The brainstem and fourth ventricle are within normal limits. The basal ganglia are unremarkable in appearance. The cerebral hemispheres demonstrate grossly normal gray-white differentiation. No mass effect or midline shift is seen.  There is no evidence of fracture; visualized osseous structures are unremarkable in appearance. The orbits are within normal limits. The paranasal sinuses and mastoid air cells are well-aerated. No significant soft tissue abnormalities are seen.  IMPRESSION: 1. No acute intracranial pathology seen on CT. 2. Mild cortical volume loss and scattered small vessel ischemic microangiopathy. 3. 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   Electronically Signed   By: Garald Balding M.D.   On: 11/13/2014 22:35       IMPRESSION:   *  GI bleed with limited dark stool and emesis. Habitual NSAID use, may be source for ulcers.  Hx of GERD with erosions, s/p dilations of esophageal strictures.  On daily PPI.   *  Left eye pain without altered vision.  CT head negative.   *  ETOH abuse. LFTs and coags normal.  No hx liver disease.   *  Hx glucose intolerance.  Sugars elevated currently.  No PTA diabetic meds.     PLAN:     *  EGD hopefully late today. Keep NPO except sips. BID Hgb/hct.    Azucena Freed  11/14/2014, 9:00 AM Pager: [phone]

## 2014-11-14 NOTE — Care Management Note (Signed)
    Page 1 of 1   11/14/2014     12:27:10 PM CARE MANAGEMENT NOTE 11/14/2014  Patient:  Vincent Mcclure, Vincent Mcclure   Account Number:  [id number]  Date Initiated:  11/14/2014  Documentation initiated by:  Elissa Hefty  Subjective/Objective Assessment:   adm w gi bleed     Action/Plan:   lives w fam, pcp dr Cathlean Cower   Anticipated DC Date:     Anticipated DC Plan:           Choice offered to / List presented to:             Status of service:   Medicare Important Message given?   (If response is "NO", the following Medicare IM given date fields will be blank) Date Medicare IM given:   Medicare IM given by:   Date Additional Medicare IM given:   Additional Medicare IM given by:    Discharge Disposition:    Per UR Regulation:  Reviewed for med. necessity/level of care/duration of stay  If discussed at [place] of Stay Meetings, dates discussed:    Comments:

## 2014-11-14 NOTE — H&P (Signed)
[facility] Admission History and Physical       GEDALYA JIM BPZ:[id number] DOB: [birth year] DOA: 11/13/2014  Referring physician:  EDP PCP: Vincent Cower, MD  Specialists:   Chief Complaint: Black Stools  HPI: Vincent Mcclure is a 81 y.o. male with a history of HTN and Hyperlipidemia who presented to the ED with complaints of several episodes of melanotic stools since the AM.  When EMS arrived he had a witness episode of Hematemesis.   He also had complaints of severe pain behind his left eye all day today.   He reports that he took an 800 mg  Ibuprofen, and 2 Aleve tablets and a Tramadol tablet.  The pain behind his eye felt like pressure and squeezing, he denied any vision loss or change.   A CT scan of the head was done in the ED  and was found to be negative for acute findings.   His admit Hb was 12.7, and he was found to have tachycardia.   He was referred for admission, and GI Dr Henrene Pastor was consulted to see him in the AM.      Review of Systems:  Constitutional: No Weight Loss, No Weight Gain, Night Sweats, Fevers, Chills, Dizziness, Fatigue, or Generalized Weakness HEENT: No Headaches, +Left Eye Pain, Difficulty Swallowing,Tooth/Dental Problems,Sore Throat,  No Sneezing, Rhinitis, Ear Ache, Nasal Congestion, or Post Nasal Drip,  Cardio-vascular:  No Chest pain, Orthopnea, PND, Edema in Lower Extremities, Anasarca, Dizziness, Palpitations  Resp: No Dyspnea, No DOE, No Productive Cough, No Non-Productive Cough, No Hemoptysis, No Wheezing.    GI: No Heartburn, Indigestion, Abdominal Pain, Nausea, Vomiting, Diarrhea, +Hematemesis, Hematochezia, +Melena, Change in Bowel Habits,  Loss of Appetite  GU: No Dysuria, Change in Color of Urine, No Urgency or Frequency, No Flank pain.  Musculoskeletal: No Joint Pain or Swelling, No Decreased Range of Motion, No Back Pain.  Neurologic: No Syncope, No Seizures, Muscle Weakness, Paresthesia, Vision Disturbance or Loss, No Diplopia, No Vertigo,  No Difficulty Walking,  Skin: No Rash or Lesions. Psych: No Change in Mood or Affect, No Depression or Anxiety, No Memory loss, No Confusion, or Hallucinations   Past Medical History  Diagnosis Date  . Hypertension        Hyperlipidemia   Past Surgical History  Procedure Laterality Date  . Cataract extraction  bilat       Prior to Admission medications   Medication Sig Start Date End Date Taking? Authorizing Provider  lisinopril-hydrochlorothiazide (PRINZIDE,ZESTORETIC) 20-25 MG per tablet Take 1 tablet by mouth daily.  08/21/14  Yes Historical Provider, MD  pravastatin (PRAVACHOL) 40 MG tablet Take 40 mg by mouth daily.  08/21/14  Yes Historical Provider, MD  traMADol (ULTRAM) 50 MG tablet Take 50 mg by mouth every 6 (six) hours as needed for moderate pain.  10/30/14  Yes Historical Provider, MD      No Known Allergies   Social History:  reports that he has quit smoking. He does not have any smokeless tobacco history on file. He reports that he drinks alcohol. His drug history is not on file.     History reviewed. No pertinent family history.     Physical Exam:  GEN:  Pleasant Obese Elderly  81 y.o. Caucasian male examined  and in no acute distress; cooperative with exam Filed Vitals:   11/13/14 2300 11/13/14 2330 11/13/14 2345 11/14/14 0029  BP: 129/78 140/73 134/78 125/71  Pulse: 113 118 123 124  Temp:      TempSrc:  Resp: 20 16 18    Height:      Weight:      SpO2: 97% 96% 95%    Blood pressure 125/71, pulse 124, temperature 98.5 F (36.9 C), temperature source Oral, resp. rate 18, height 6' (1.829 m), weight 102.513 kg (226 lb), SpO2 95 %. PSYCH:   He is alert and oriented x4; does not appear anxious does not appear depressed; affect is normal HEENT: Normocephalic and Atraumatic, Mucous membranes pink; PERRLA; EOM intact; Fundi:  Benign;  No scleral icterus, Nares: Patent, Oropharynx: Clear, Fair Dentition,    Neck:  FROM, No Cervical Lymphadenopathy nor  Thyromegaly or Carotid Bruit; No JVD; Breasts:: Not examined CHEST WALL: No tenderness CHEST: Normal respiration, clear to auscultation bilaterally HEART: Regular rate and rhythm; no murmurs rubs or gallops BACK: No kyphosis or scoliosis; No CVA tenderness ABDOMEN: Positive Bowel Sounds, Obese, Soft Non-Tender; No Masses, No Organomegaly, No Pannus; No Intertriginous candida. Rectal Exam: Not done EXTREMITIES: No Cyanosis, Clubbing, or Edema; No Ulcerations. Genitalia: not examined PULSES: 2+ and symmetric SKIN: Normal hydration no rash or ulceration CNS:  Alert and Oriented x 4, No Focal Deficits Vascular: pulses palpable throughout    Labs on Admission:  Basic Metabolic Panel:  Recent Labs Lab 11/13/14 2016  NA 135  K 3.9  CL 102  CO2 22  GLUCOSE 150*  BUN 50*  CREATININE 1.33  CALCIUM 9.1   Liver Function Tests:  Recent Labs Lab 11/13/14 2016  AST 21  ALT 19  ALKPHOS 60  BILITOT 0.6  PROT 6.1  ALBUMIN 3.8   No results for input(s): LIPASE, AMYLASE in the last 168 hours. No results for input(s): AMMONIA in the last 168 hours. CBC:  Recent Labs Lab 11/13/14 2016 11/14/14 0021  WBC 6.7  --   NEUTROABS 5.4  --   HGB 12.7* 11.8*  HCT 38.2* 35.6*  MCV 87.6  --   PLT 196  --    Cardiac Enzymes: No results for input(s): CKTOTAL, CKMB, CKMBINDEX, TROPONINI in the last 168 hours.  BNP (last 3 results) No results for input(s): BNP in the last 8760 hours.  ProBNP (last 3 results) No results for input(s): PROBNP in the last 8760 hours.  CBG: No results for input(s): GLUCAP in the last 168 hours.  Radiological Exams on Admission: Dg Chest 2 View  11/13/2014   CLINICAL DATA:  Severe left periorbital pain, chronic in nature. Vomiting and tarry stools. Initial encounter.  EXAM: CHEST  2 VIEW  COMPARISON:  None.  FINDINGS: The lungs are well-aerated and clear. There is no evidence of focal opacification, pleural effusion or pneumothorax. Apparent calcified  granulomata are seen near the right lung apex, measuring up to 5 mm in size.  The heart is normal in size; the mediastinal contour is within normal limits. No acute osseous abnormalities are seen.  IMPRESSION: No acute cardiopulmonary process seen.   Electronically Signed   By: Garald Balding M.D.   On: 11/13/2014 21:18   Ct Head Wo Contrast  11/13/2014   CLINICAL DATA:  Severe pain behind the left eye, acute onset. Initial encounter.  EXAM: CT HEAD WITHOUT CONTRAST  TECHNIQUE: Contiguous axial images were obtained from the base of the skull through the vertex without intravenous contrast.  COMPARISON:  None.  FINDINGS: There is no evidence of acute infarction, mass lesion, or intra- or extra-axial hemorrhage on CT.  Prominence of the ventricles and sulci reflects mild cortical volume loss. Mild cerebellar atrophy is noted. Mild  periventricular white matter change likely reflects small vessel ischemic microangiopathy. A small chronic infarct is noted at the right frontal lobe.  The brainstem and fourth ventricle are within normal limits. The basal ganglia are unremarkable in appearance. The cerebral hemispheres demonstrate grossly normal gray-white differentiation. No mass effect or midline shift is seen.  There is no evidence of fracture; visualized osseous structures are unremarkable in appearance. The orbits are within normal limits. The paranasal sinuses and mastoid air cells are well-aerated. No significant soft tissue abnormalities are seen.  IMPRESSION: 1. No acute intracranial pathology seen on CT. 2. Mild cortical volume loss and scattered small vessel ischemic microangiopathy. 3. 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   Electronically Signed   By: Garald Balding M.D.   On: 11/13/2014 22:35     EKG: Independently reviewed. Sinus Tachycardia at 116, S-T depression in Lateral leads, Normal Axis   Assessment/Plan:     81 y.o. male with  Principal Problem:   1.   Upper GI bleed/GI bleed     Stepdown UNit Monitoring   IV Protonix Drip   Monitor H/Hs   Transfuse PRN   GI Consulted Dr. Henrene Pastor      Active Problems:  2.    Anemia due to blood loss, acute   Anemia Panel ordered   Monitor H/Hs   Transfuse PRN      3.    Alcohol abuse   CIWA Protocol ordered     4.    Left Eye Pain- CT scan done Negative Acute Findings, Chronic Small Rt Frontal Infarct Seen     And Visual Acuity is intact   Pain Control PRN   Ophthalmology consult or Referral  May be needed     5.   Essential hypertension   Monitor BPs   IV Hydralazine PRN SBP > 170   Resume Lisinopril/HCTZ when taking PO    6.   Hyperlipidemia   On Pravastatin Rx    Resume when taking PO    7.   DVT Prophylaxis    SCDs     Code Status:    FULL CODE Family Communication:    No Family Present Disposition Plan:   Inpatient Stepdown Unit       Time spent:  Alexander C [facility] Pager [phone]   If [place] Please Contact the Day Rounding Team MD for [facility]  If 7PM-7AM, Please Contact Night-Floor Coverage  [email or web address] Password [email or web address] 11/14/2014, 12:03 AM

## 2014-11-15 ENCOUNTER — Encounter: Payer: Self-pay | Admitting: Internal Medicine

## 2014-11-15 ENCOUNTER — Encounter (HOSPITAL_COMMUNITY): Payer: Self-pay | Admitting: Internal Medicine

## 2014-11-15 LAB — COMPREHENSIVE METABOLIC PANEL
ALT: 17 U/L (ref 0–53)
AST: 23 U/L (ref 0–37)
Albumin: 3.1 g/dL — ABNORMAL LOW (ref 3.5–5.2)
Alkaline Phosphatase: 51 U/L (ref 39–117)
Anion gap: 1 — ABNORMAL LOW (ref 5–15)
BILIRUBIN TOTAL: 0.7 mg/dL (ref 0.3–1.2)
BUN: 21 mg/dL (ref 6–23)
CHLORIDE: 105 mmol/L (ref 96–112)
CO2: 30 mmol/L (ref 19–32)
Calcium: 8.4 mg/dL (ref 8.4–10.5)
Creatinine, Ser: 0.96 mg/dL (ref 0.50–1.35)
GFR calc non Af Amer: 76 mL/min — ABNORMAL LOW (ref >90)
GFR, EST AFRICAN AMERICAN: 88 mL/min — AB (ref >90)
Glucose, Bld: 110 mg/dL — ABNORMAL HIGH (ref 70–99)
Potassium: 4 mmol/L (ref 3.5–5.1)
Sodium: 136 mmol/L (ref 135–145)
Total Protein: 5.1 g/dL — ABNORMAL LOW (ref 6.0–8.3)

## 2014-11-15 LAB — CBC
HCT: 31.8 % — ABNORMAL LOW (ref 39.0–52.0)
Hemoglobin: 10.5 g/dL — ABNORMAL LOW (ref 13.0–17.0)
MCH: 29.2 pg (ref 26.0–34.0)
MCHC: 33 g/dL (ref 30.0–36.0)
MCV: 88.6 fL (ref 78.0–100.0)
Platelets: 165 10*3/uL (ref 150–400)
RBC: 3.59 MIL/uL — ABNORMAL LOW (ref 4.22–5.81)
RDW: 13.7 % (ref 11.5–15.5)
WBC: 6.1 10*3/uL (ref 4.0–10.5)

## 2014-11-15 LAB — HEMOGLOBIN AND HEMATOCRIT, BLOOD
HCT: 35 % — ABNORMAL LOW (ref 39.0–52.0)
Hemoglobin: 11.4 g/dL — ABNORMAL LOW (ref 13.0–17.0)

## 2014-11-15 MED ORDER — TRAMADOL HCL 50 MG PO TABS: 50.0000 mg | ORAL_TABLET | Freq: Four times a day (QID) | ORAL | Status: DC | PRN

## 2014-11-15 MED ORDER — PANTOPRAZOLE SODIUM 40 MG PO TBEC: 40.0000 mg | DELAYED_RELEASE_TABLET | Freq: Two times a day (BID) | ORAL | Status: DC

## 2014-11-15 MED ORDER — AMLODIPINE BESYLATE 5 MG PO TABS: 5.0000 mg | ORAL_TABLET | Freq: Every day | ORAL | Status: DC

## 2014-11-15 MED ORDER — ACETAMINOPHEN 325 MG PO TABS: 650.0000 mg | ORAL_TABLET | Freq: Four times a day (QID) | ORAL | Status: DC | PRN

## 2014-11-15 NOTE — Progress Notes (Signed)
Daily Rounding Note  11/15/2014, 11:30 AM  LOS: 2 days   SUBJECTIVE:       No stools, no nausea, feels well.  Now tells me he was not taking Omeprazole PTA.  OBJECTIVE:         Vital signs in last 24 hours:    Temp:  [97.7 F (36.5 C)-98.4 F (36.9 C)] 98.1 F (36.7 C) (02/05 0727) Pulse Rate:  [80-116] 92 (02/05 0727) Resp:  [10-30] 15 (02/05 0727) BP: (131-171)/(73-99) 132/74 mmHg (02/05 0727) SpO2:  [92 %-100 %] 99 % (02/05 0727) Weight:  [232 lb 5.8 oz (105.4 kg)] 232 lb 5.8 oz (105.4 kg) (02/04 1200) Last BM Date: 11/14/14 Filed Weights   11/13/14 1952 11/14/14 1200  Weight: 226 lb (102.513 kg) 232 lb 5.8 oz (105.4 kg)   General: looks unhealthy, not acutely ill.    Heart: RRR Chest: clear bil.   Abdomen: soft, NT, ND BS active.  Extremities: no CCE Neuro/Psych:  Coopperative, pleasant, HOH.   Intake/Output from previous day: 02/04 0701 - 02/05 0700 In: 1870 [P.O.:720; I.V.:1150] Out: 1500 [Urine:1500]  Intake/Output this shift: Total I/O In: 400 [P.O.:400] Out: 100 [Urine:100]  Lab Results:  Recent Labs  11/13/14 2016  11/14/14 0534 11/14/14 1836 11/15/14 0309 11/15/14 0800  WBC 6.7  --  5.8  --  6.1  --   HGB 12.7*  < > 11.4* 11.0* 10.5* 11.4*  HCT 38.2*  < > 34.1* 33.1* 31.8* 35.0*  PLT 196  --  183  --  165  --   < > = values in this interval not displayed. BMET  Recent Labs  11/13/14 2016 11/14/14 0534 11/15/14 0309  NA 135 135 136  K 3.9 3.6 4.0  CL 102 104 105  CO2 22 27 30   GLUCOSE 150* 117* 110*  BUN 50* 43* 21  CREATININE 1.33 1.07 0.96  CALCIUM 9.1 8.3* 8.4   LFT  Recent Labs  11/13/14 2016 11/15/14 0309  PROT 6.1 5.1*  ALBUMIN 3.8 3.1*  AST 21 23  ALT 19 17  ALKPHOS 60 51  BILITOT 0.6 0.7   PT/INR  Recent Labs  11/13/14 2037  LABPROT 14.2  INR 1.09   Hepatitis Panel No results for input(s): HEPBSAG, HCVAB, HEPAIGM, HEPBIGM in the last 72  hours.  Studies/Results: Dg Chest 2 View  11/13/2014   CLINICAL DATA:  Severe left periorbital pain, chronic in nature. Vomiting and tarry stools. Initial encounter.  EXAM: CHEST  2 VIEW  COMPARISON:  None.  FINDINGS: The lungs are well-aerated and clear. There is no evidence of focal opacification, pleural effusion or pneumothorax. Apparent calcified granulomata are seen near the right lung apex, measuring up to 5 mm in size.  The heart is normal in size; the mediastinal contour is within normal limits. No acute osseous abnormalities are seen.  IMPRESSION: No acute cardiopulmonary process seen.   Electronically Signed   By: Garald Balding M.D.   On: 11/13/2014 21:18   Ct Head Wo Contrast  11/13/2014   CLINICAL DATA:  Severe pain behind the left eye, acute onset. Initial encounter.  EXAM: CT HEAD WITHOUT CONTRAST  TECHNIQUE: Contiguous axial images were obtained from the base of the skull through the vertex without intravenous contrast.  COMPARISON:  None.  FINDINGS: There is no evidence of acute infarction, mass lesion, or intra- or extra-axial hemorrhage on CT.  Prominence of the ventricles and sulci reflects mild cortical volume  loss. Mild cerebellar atrophy is noted. Mild periventricular white matter change likely reflects small vessel ischemic microangiopathy. A small chronic infarct is noted at the right frontal lobe.  The brainstem and fourth ventricle are within normal limits. The basal ganglia are unremarkable in appearance. The cerebral hemispheres demonstrate grossly normal gray-white differentiation. No mass effect or midline shift is seen.  There is no evidence of fracture; visualized osseous structures are unremarkable in appearance. The orbits are within normal limits. The paranasal sinuses and mastoid air cells are well-aerated. No significant soft tissue abnormalities are seen.  IMPRESSION: 1. No acute intracranial pathology seen on CT. 2. Mild cortical volume loss and scattered small vessel  ischemic microangiopathy. 3. 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   Electronically Signed   By: Garald Balding M.D.   On: 11/13/2014 22:35   Scheduled Meds: . pantoprazole  40 mg Oral BID AC   Continuous Infusions: . sodium chloride Stopped (11/14/14 2200)  . sodium chloride 20 mL/hr at 11/14/14 2200   PRN Meds:.acetaminophen **OR** acetaminophen, HYDROmorphone (DILAUDID) injection, LORazepam, ondansetron **OR** ondansetron (ZOFRAN) IV, oxyCODONE  ASSESMENT:   *  GI bleed with limited dark stool and emesis. Habitual NSAID use. History of erosive GERD and esophageal strictures. 11/14/14 EGD revealed distal ulcerative esophagitis, medium sized ulcer of gastric antrum, duodenal inflammation with superficial ulceration at duodenal bulb. No active bleeding or visible vessels encountered. PPI (Protonix) now at twice daily oral dosing. Hemoglobin is stable/improved.    PLAN   *  Arranged GI ROV for 01/2015 (soonest available with MD).  Stop naproxen and all NSAIDs, stop ETOH.  BID PPI.     Vincent Mcclure  11/15/2014, 11:30 AM Pager: [phone]

## 2014-11-15 NOTE — Discharge Summary (Signed)
Physician Discharge Summary  Vincent Mcclure OIZ:[id number] DOB: 1932/06/13 DOA: 11/13/2014  PCP: Cathlean Cower, MD  Admit date: 11/13/2014 Discharge date: 11/15/2014  Time spent: >35 minutes  Recommendations for Outpatient Follow-up:  F/u with GI 4-6 weeks F/u with PCP in 1 week as needed  Discharge Diagnoses:  Principal Problem:   Upper GI bleed Active Problems:   GI bleed   Essential hypertension   Anemia due to blood loss, acute   Alcohol abuse   Hyperlipidemia   Acute esophagitis   Acute gastric ulcer   Duodenal ulcer disease   Discharge Condition: stable   Diet recommendation: low sodium  Filed Weights   11/13/14 1952 11/14/14 1200  Weight: 102.513 kg (226 lb) 105.4 kg (232 lb 5.8 oz)   Egd:  1. Esophagitis with ulceration in the lower third of the esophagus  2. Medium sized ulcer was found in the gastric antrum; multiple biopsies 3. Duodenal inflammation with superficial ulceration was found in the duodenal bulb  RECOMMENDATIONS: 1. Twice daily PPI for at least 8 weeks, then daily thereafter 2. Alcohol avoidance given formation seen in the esophagus, stomach and small bowel today 3. Await pathology results 4. 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 When EMS arrived he had a witness episode of Hematemesis. He also had complaints of severe pain behind his left eye all day today. He reports that he took an 800 mg Ibuprofen, and 2 Aleve tablets and a Tramadol tablet. "   Hospital Course:  1. GIB, likely due to NSAIDS; no new episode of bleeding  -s/p EGD: esophagitis, ulcerations, PUD, duodenal superficial ulcerations;   -no new episodes of bleeding, started PPI BID, d/c nsaids, stop etoh; d/w patient, recommended repeat EGD 4-8 week   2. Mild acute mild  blood loss anemia due to GIB; Hg is stable no new episodes of bleeding; patient did not require blood transfusions  3. Alcoholism, patient denies h/o withdrawals;  counseled to to drinking etoh  4. HTN, started amlodipine, hold diuretics, ACE due to AKI;    Procedures:  EGD as bove  (i.e. Studies not automatically included, echos, thoracentesis, etc; not x-rays)  Consultations:  GI  Discharge Exam: Filed Vitals:   11/15/14 0727  BP: 132/74  Pulse: 92  Temp: 98.1 F (36.7 C)  Resp: 15    General: alert Cardiovascular: s1,s2 rrr Respiratory: CTA BL  Discharge Instructions  Discharge Instructions    Diet - low sodium heart healthy    Complete by:  As directed      Increase activity slowly    Complete by:  As directed             Medication List    STOP taking these medications        lisinopril-hydrochlorothiazide 20-25 MG per tablet  Commonly known as:  PRINZIDE,ZESTORETIC      TAKE these medications        acetaminophen 325 MG tablet  Commonly known as:  TYLENOL  Take 2 tablets (650 mg total) by mouth every 6 (six) hours as needed for mild pain (or Fever >/= 101).     amLODipine 5 MG tablet  Commonly known as:  NORVASC  Take 1 tablet (5 mg total) by mouth daily.     pantoprazole 40 MG tablet  Commonly known as:  PROTONIX  Take 1 tablet (40 mg total) by mouth 2 (two) times daily before a meal.     pravastatin 40 MG tablet  Commonly known as:  PRAVACHOL  Take 40 mg by mouth daily.     traMADol 50 MG tablet  Commonly known as:  ULTRAM  Take 1 tablet (50 mg total) by mouth every 6 (six) hours as needed for moderate pain.       Allergies  Allergen Reactions  . Atorvastatin        Follow-up Information    Follow up with Cathlean Cower, MD In 1 week.   Specialties:  Internal Medicine, Radiology   Contact information:   [address] [place] [address] [phone]       Follow up with Jerene Bears, MD On 01/13/2015.   Specialty:   Gastroenterology   Why:  1:45 PM for follow up of ulcers.    Contact information:   [address] [place] [address] [phone]        The results of significant diagnostics from this hospitalization (including imaging, microbiology, ancillary and laboratory) are listed below for reference.    Significant Diagnostic Studies: Dg Chest 2 View  11/13/2014   CLINICAL DATA:  Severe left periorbital pain, chronic in nature. Vomiting and tarry stools. Initial encounter.  EXAM: CHEST  2 VIEW  COMPARISON:  None.  FINDINGS: The lungs are well-aerated and clear. There is no evidence of focal opacification, pleural effusion or pneumothorax. Apparent calcified granulomata are seen near the right lung apex, measuring up to 5 mm in size.  The heart is normal in size; the mediastinal contour is within normal limits. No acute osseous abnormalities are seen.  IMPRESSION: No acute cardiopulmonary process seen.   Electronically Signed   By: Garald Balding M.D.   On: 11/13/2014 21:18   Ct Head Wo Contrast  11/13/2014   CLINICAL DATA:  Severe pain behind the left eye, acute onset. Initial encounter.  EXAM: CT HEAD WITHOUT CONTRAST  TECHNIQUE: Contiguous axial images were obtained from the base of the skull through the vertex without intravenous contrast.  COMPARISON:  None.  FINDINGS: There is no evidence of acute infarction, mass lesion, or intra- or extra-axial hemorrhage on CT.  Prominence of the ventricles and sulci reflects mild cortical volume loss. Mild cerebellar atrophy is noted. Mild periventricular white matter change likely reflects small vessel ischemic microangiopathy. A small chronic infarct is noted at the right frontal lobe.  The brainstem and fourth ventricle are within normal limits. The basal ganglia are unremarkable in appearance. The cerebral hemispheres demonstrate grossly normal gray-white differentiation. No mass effect or midline shift is seen.  There is no evidence of fracture;  visualized osseous structures are unremarkable in appearance. The orbits are within normal limits. The paranasal sinuses and mastoid air cells are well-aerated. No significant soft tissue abnormalities are seen.  IMPRESSION: 1. No acute intracranial pathology seen on CT. 2. Mild cortical volume loss and scattered small vessel ischemic microangiopathy. 3. 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   Electronically Signed   By: Garald Balding M.D.   On: 11/13/2014 22:35    Microbiology: Recent Results (from the past 240 hour(s))  MRSA PCR Screening     Status: None   Collection Time: 11/14/14 11:59 AM  Result Value Ref Range Status   MRSA by PCR NEGATIVE NEGATIVE Final    Comment:  The GeneXpert MRSA Assay (FDA approved for NASAL specimens only), is one component of a comprehensive MRSA colonization surveillance program. It is not intended to diagnose MRSA infection nor to guide or monitor treatment for MRSA infections.      Labs: Basic Metabolic Panel:  Recent Labs Lab 11/13/14 2016 11/14/14 0534 11/15/14 0309  NA 135 135 136  K 3.9 3.6 4.0  CL 102 104 105  CO2 22 27 30   GLUCOSE 150* 117* 110*  BUN 50* 43* 21  CREATININE 1.33 1.07 0.96  CALCIUM 9.1 8.3* 8.4   Liver Function Tests:  Recent Labs Lab 11/13/14 2016 11/15/14 0309  AST 21 23  ALT 19 17  ALKPHOS 60 51  BILITOT 0.6 0.7  PROT 6.1 5.1*  ALBUMIN 3.8 3.1*   No results for input(s): LIPASE, AMYLASE in the last 168 hours. No results for input(s): AMMONIA in the last 168 hours. CBC:  Recent Labs Lab 11/13/14 2016 11/14/14 0021 11/14/14 0534 11/14/14 1836 11/15/14 0309 11/15/14 0800  WBC 6.7  --  5.8  --  6.1  --   NEUTROABS 5.4  --   --   --   --   --   HGB 12.7* 11.8* 11.4* 11.0* 10.5* 11.4*  HCT 38.2* 35.6* 34.1* 33.1* 31.8* 35.0*  MCV 87.6  --  86.1  --  88.6  --   PLT 196  --  183  --  165  --    Cardiac Enzymes: No results for input(s): CKTOTAL, CKMB, CKMBINDEX, TROPONINI in  the last 168 hours. BNP: BNP (last 3 results) No results for input(s): BNP in the last 8760 hours.  ProBNP (last 3 results) No results for input(s): PROBNP in the last 8760 hours.  CBG: No results for input(s): GLUCAP in the last 168 hours.     SignedKinnie Feil  [facility] 11/15/2014, 12:19 PM

## 2014-11-18 ENCOUNTER — Encounter: Payer: Self-pay | Admitting: Internal Medicine

## 2014-11-28 ENCOUNTER — Encounter: Payer: Self-pay | Admitting: Internal Medicine

## 2014-12-27 ENCOUNTER — Encounter: Payer: Self-pay | Admitting: *Deleted

## 2015-01-13 ENCOUNTER — Ambulatory Visit: Payer: Medicare Other | Admitting: Internal Medicine

## 2015-01-17 ENCOUNTER — Ambulatory Visit: Payer: Self-pay | Admitting: Internal Medicine

## 2015-01-24 ENCOUNTER — Ambulatory Visit (INDEPENDENT_AMBULATORY_CARE_PROVIDER_SITE_OTHER): Payer: Commercial Managed Care - HMO | Admitting: Internal Medicine

## 2015-01-24 ENCOUNTER — Other Ambulatory Visit (INDEPENDENT_AMBULATORY_CARE_PROVIDER_SITE_OTHER): Payer: Commercial Managed Care - HMO

## 2015-01-24 VITALS — BP 118/60 | HR 80 | Temp 98.3°F | Resp 18 | Ht 72.0 in | Wt 225.1 lb

## 2015-01-24 DIAGNOSIS — M25512 Pain in left shoulder: Secondary | ICD-10-CM | POA: Diagnosis not present

## 2015-01-24 DIAGNOSIS — Z0000 Encounter for general adult medical examination without abnormal findings: Secondary | ICD-10-CM

## 2015-01-24 LAB — HEPATIC FUNCTION PANEL
ALBUMIN: 4.2 g/dL (ref 3.5–5.2)
ALT: 12 U/L (ref 0–53)
AST: 16 U/L (ref 0–37)
Alkaline Phosphatase: 79 U/L (ref 39–117)
Bilirubin, Direct: 0.1 mg/dL (ref 0.0–0.3)
Total Bilirubin: 0.3 mg/dL (ref 0.2–1.2)
Total Protein: 7.3 g/dL (ref 6.0–8.3)

## 2015-01-24 LAB — CBC WITH DIFFERENTIAL/PLATELET
BASOS ABS: 0 10*3/uL (ref 0.0–0.1)
Basophils Relative: 0.3 % (ref 0.0–3.0)
Eosinophils Absolute: 0 10*3/uL (ref 0.0–0.7)
Eosinophils Relative: 0.7 % (ref 0.0–5.0)
HEMATOCRIT: 32.9 % — AB (ref 39.0–52.0)
Hemoglobin: 10.9 g/dL — ABNORMAL LOW (ref 13.0–17.0)
LYMPHS PCT: 22.8 % (ref 12.0–46.0)
Lymphs Abs: 1.4 10*3/uL (ref 0.7–4.0)
MCHC: 33.2 g/dL (ref 30.0–36.0)
MCV: 73.8 fl — AB (ref 78.0–100.0)
Monocytes Absolute: 0.8 10*3/uL (ref 0.1–1.0)
Monocytes Relative: 13.2 % — ABNORMAL HIGH (ref 3.0–12.0)
NEUTROS ABS: 4 10*3/uL (ref 1.4–7.7)
Neutrophils Relative %: 63 % (ref 43.0–77.0)
Platelets: 291 10*3/uL (ref 150.0–400.0)
RBC: 4.45 Mil/uL (ref 4.22–5.81)
RDW: 16.7 % — AB (ref 11.5–15.5)
WBC: 6.3 10*3/uL (ref 4.0–10.5)

## 2015-01-24 LAB — URINALYSIS, ROUTINE W REFLEX MICROSCOPIC
Bilirubin Urine: NEGATIVE
HGB URINE DIPSTICK: NEGATIVE
KETONES UR: NEGATIVE
Leukocytes, UA: NEGATIVE
Nitrite: NEGATIVE
RBC / HPF: NONE SEEN (ref 0–?)
SPECIFIC GRAVITY, URINE: 1.01 (ref 1.000–1.030)
Total Protein, Urine: NEGATIVE
URINE GLUCOSE: NEGATIVE
Urobilinogen, UA: 0.2 (ref 0.0–1.0)
pH: 7 (ref 5.0–8.0)

## 2015-01-24 LAB — LIPID PANEL
Cholesterol: 138 mg/dL (ref 0–200)
HDL: 47.1 mg/dL (ref >39.00)
LDL CALC: 62 mg/dL (ref 0–99)
NonHDL: 90.9
TRIGLYCERIDES: 145 mg/dL (ref 0.0–149.0)
Total CHOL/HDL Ratio: 3
VLDL: 29 mg/dL (ref 0.0–40.0)

## 2015-01-24 LAB — BASIC METABOLIC PANEL
BUN: 25 mg/dL — ABNORMAL HIGH (ref 6–23)
CALCIUM: 9.8 mg/dL (ref 8.4–10.5)
CO2: 29 meq/L (ref 19–32)
Chloride: 98 mEq/L (ref 96–112)
Creatinine, Ser: 1.48 mg/dL (ref 0.40–1.50)
GFR: 48.47 mL/min — ABNORMAL LOW (ref >60.00)
GLUCOSE: 117 mg/dL — AB (ref 70–99)
Potassium: 4.6 mEq/L (ref 3.5–5.1)
SODIUM: 132 meq/L — AB (ref 135–145)

## 2015-01-24 LAB — TSH: TSH: 0.72 u[IU]/mL (ref 0.35–4.50)

## 2015-01-24 LAB — PSA: PSA: 0.54 ng/mL (ref 0.10–4.00)

## 2015-01-24 NOTE — Assessment & Plan Note (Signed)
To see Dr Smith/sport med in the office

## 2015-01-24 NOTE — Patient Instructions (Addendum)

## 2015-01-24 NOTE — Progress Notes (Signed)
Pre visit review using our clinic review tool, if applicable. No additional management support is needed unless otherwise documented below in the visit note. 
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
## 2015-01-24 NOTE — Assessment & Plan Note (Signed)

## 2015-01-27 ENCOUNTER — Encounter: Payer: Self-pay | Admitting: Internal Medicine

## 2015-03-14 ENCOUNTER — Other Ambulatory Visit: Payer: Self-pay | Admitting: Internal Medicine

## 2015-12-03 ENCOUNTER — Telehealth: Payer: Self-pay

## 2015-12-03 ENCOUNTER — Telehealth: Payer: Self-pay | Admitting: Internal Medicine

## 2015-12-03 MED ORDER — PRAVASTATIN SODIUM 40 MG PO TABS: ORAL_TABLET | ORAL | Status: DC

## 2015-12-03 MED ORDER — LISINOPRIL-HYDROCHLOROTHIAZIDE 20-25 MG PO TABS: 1.0000 | ORAL_TABLET | Freq: Every day | ORAL | Status: DC

## 2015-12-03 MED ORDER — TRAMADOL HCL 50 MG PO TABS: 50.0000 mg | ORAL_TABLET | Freq: Four times a day (QID) | ORAL | Status: DC | PRN

## 2015-12-03 NOTE — Telephone Encounter (Signed)
Medication printed sent and faxed to pharmacy

## 2015-12-03 NOTE — Telephone Encounter (Signed)
Medications sent to new pharmacy

## 2015-12-03 NOTE — Telephone Encounter (Signed)
[place] for other besides tramadol done per routine refills\ To corinne

## 2015-12-03 NOTE — Telephone Encounter (Signed)
Done hardcopy to Corinne  

## 2015-12-03 NOTE — Addendum Note (Signed)
Addended by: Biagio Borg on: 12/03/2015 12:51 PM   Modules accepted: Orders

## 2015-12-03 NOTE — Telephone Encounter (Signed)
Please advise patient is needing 3 month refill on tramadol

## 2015-12-03 NOTE — Telephone Encounter (Signed)
2.22.17 Pt came in requesting 3 month refills of Lisinopril,Pravastatin, and Tramadol. Please call pt with any questions. MS

## 2015-12-16 ENCOUNTER — Telehealth: Payer: Self-pay | Admitting: Internal Medicine

## 2015-12-16 NOTE — Telephone Encounter (Signed)
Pt states his lisinopril-hydrochlorothiazide (PRINZIDE,ZESTORETIC) 20-25 MG tablet [[id number]] , pravastatin (PRAVACHOL) 40 MG tablet [[id number] and traMADol (ULTRAM) 50 MG tablet [[id number] needs to be sent to [facility] on [address]  He no longer uses [facility], they denied refilling it.

## 2016-08-11 ENCOUNTER — Ambulatory Visit (INDEPENDENT_AMBULATORY_CARE_PROVIDER_SITE_OTHER): Payer: PPO | Admitting: Internal Medicine

## 2016-08-11 ENCOUNTER — Other Ambulatory Visit (INDEPENDENT_AMBULATORY_CARE_PROVIDER_SITE_OTHER): Payer: PPO

## 2016-08-11 ENCOUNTER — Encounter: Payer: Self-pay | Admitting: Internal Medicine

## 2016-08-11 VITALS — BP 140/80 | HR 90 | Temp 98.0°F | Resp 20 | Wt 217.0 lb

## 2016-08-11 DIAGNOSIS — R7302 Impaired glucose tolerance (oral): Secondary | ICD-10-CM | POA: Diagnosis not present

## 2016-08-11 DIAGNOSIS — G609 Hereditary and idiopathic neuropathy, unspecified: Secondary | ICD-10-CM

## 2016-08-11 DIAGNOSIS — Z7289 Other problems related to lifestyle: Secondary | ICD-10-CM | POA: Insufficient documentation

## 2016-08-11 DIAGNOSIS — Z789 Other specified health status: Secondary | ICD-10-CM | POA: Diagnosis not present

## 2016-08-11 DIAGNOSIS — Z0000 Encounter for general adult medical examination without abnormal findings: Secondary | ICD-10-CM

## 2016-08-11 LAB — CBC WITH DIFFERENTIAL/PLATELET
BASOS ABS: 0 10*3/uL (ref 0.0–0.1)
Basophils Relative: 0.5 % (ref 0.0–3.0)
EOS PCT: 2.9 % (ref 0.0–5.0)
Eosinophils Absolute: 0.2 10*3/uL (ref 0.0–0.7)
HCT: 25.7 % — ABNORMAL LOW (ref 39.0–52.0)
Hemoglobin: 7.5 g/dL — CL (ref 13.0–17.0)
LYMPHS ABS: 1.3 10*3/uL (ref 0.7–4.0)
LYMPHS PCT: 19.8 % (ref 12.0–46.0)
MCHC: 29.3 g/dL — AB (ref 30.0–36.0)
MONOS PCT: 9.2 % (ref 3.0–12.0)
Monocytes Absolute: 0.6 10*3/uL (ref 0.1–1.0)
NEUTROS PCT: 67.6 % (ref 43.0–77.0)
Neutro Abs: 4.5 10*3/uL (ref 1.4–7.7)
Platelets: 251 10*3/uL (ref 150.0–400.0)
RBC: 4.51 Mil/uL (ref 4.22–5.81)
RDW: 21.9 % — ABNORMAL HIGH (ref 11.5–15.5)
WBC: 6.6 10*3/uL (ref 4.0–10.5)

## 2016-08-11 LAB — HEPATIC FUNCTION PANEL
ALBUMIN: 4.1 g/dL (ref 3.5–5.2)
ALK PHOS: 85 U/L (ref 39–117)
ALT: 6 U/L (ref 0–53)
AST: 10 U/L (ref 0–37)
BILIRUBIN DIRECT: 0.2 mg/dL (ref 0.0–0.3)
TOTAL PROTEIN: 6.8 g/dL (ref 6.0–8.3)
Total Bilirubin: 0.5 mg/dL (ref 0.2–1.2)

## 2016-08-11 LAB — BASIC METABOLIC PANEL
BUN: 14 mg/dL (ref 6–23)
CALCIUM: 9.2 mg/dL (ref 8.4–10.5)
CO2: 24 mEq/L (ref 19–32)
CREATININE: 0.84 mg/dL (ref 0.40–1.50)
Chloride: 106 mEq/L (ref 96–112)
GFR: 92.83 mL/min (ref >60.00)
GLUCOSE: 101 mg/dL — AB (ref 70–99)
Potassium: 4 mEq/L (ref 3.5–5.1)
SODIUM: 139 meq/L (ref 135–145)

## 2016-08-11 LAB — TSH: TSH: 1.1 u[IU]/mL (ref 0.35–4.50)

## 2016-08-11 LAB — LIPID PANEL
Cholesterol: 135 mg/dL (ref 0–200)
HDL: 52 mg/dL (ref >39.00)
LDL CALC: 68 mg/dL (ref 0–99)
NONHDL: 82.9
Total CHOL/HDL Ratio: 3
Triglycerides: 74 mg/dL (ref 0.0–149.0)
VLDL: 14.8 mg/dL (ref 0.0–40.0)

## 2016-08-11 LAB — URINALYSIS, ROUTINE W REFLEX MICROSCOPIC
Bilirubin Urine: NEGATIVE
HGB URINE DIPSTICK: NEGATIVE
Ketones, ur: NEGATIVE
Leukocytes, UA: NEGATIVE
Nitrite: NEGATIVE
SPECIFIC GRAVITY, URINE: 1.02 (ref 1.000–1.030)
Total Protein, Urine: NEGATIVE
UROBILINOGEN UA: 1 (ref 0.0–1.0)
Urine Glucose: NEGATIVE
pH: 6 (ref 5.0–8.0)

## 2016-08-11 LAB — HEMOGLOBIN A1C: Hgb A1c MFr Bld: 6 % (ref 4.6–6.5)

## 2016-08-11 MED ORDER — TRAMADOL HCL 50 MG PO TABS: 50.0000 mg | ORAL_TABLET | Freq: Four times a day (QID) | ORAL | 1 refills | Status: DC | PRN

## 2016-08-11 MED ORDER — AMITRIPTYLINE HCL 50 MG PO TABS: 50.0000 mg | ORAL_TABLET | Freq: Every evening | ORAL | 1 refills | Status: DC | PRN

## 2016-08-11 MED ORDER — LISINOPRIL-HYDROCHLOROTHIAZIDE 20-25 MG PO TABS: 1.0000 | ORAL_TABLET | Freq: Every day | ORAL | 3 refills | Status: DC

## 2016-08-11 MED ORDER — PRAVASTATIN SODIUM 40 MG PO TABS: 40.0000 mg | ORAL_TABLET | Freq: Every day | ORAL | 3 refills | Status: DC

## 2016-08-11 NOTE — Progress Notes (Signed)
Subjective:    Patient ID: Vincent Mcclure, male    DOB: [birth year], 82 y.o.   MRN: [id number]  HPI  Here for wellness and f/u;  Overall doing ok;  Pt denies Chest pain, worsening SOB, DOE, wheezing, orthopnea, PND, worsening LE edema, palpitations, dizziness or syncope.  Pt denies neurological change such as new headache, facial or extremity weakness,e xcept for now persistent numbness to both legs.  Pt denies polydipsia, polyuria, or low sugar symptoms. Pt states overall good compliance with treatment and medications except out ofr th past month and had some difficulty communicating with office for refills ? Due to severe hearing loss, but good tolerability, and has been trying to follow appropriate diet.  Pt denies worsening depressive symptoms, suicidal ideation or panic. No fever, night sweats, wt loss, loss of appetite, or other constitutional symptoms.  Pt states good ability with ADL's, has low fall risk, home safety reviewed and adequate, no other significant changes in hearing or vision, and only occasionally active with exercise.  No other changes in basic history per pt.  He is concerned he is "atrophying." the whole body  Declines all immunizations. Mentions ETOH use many times today, per pt drinks 2 glass wine/day, then 3-4 martinis for a week.   Past Medical History:  Diagnosis Date  . Alcohol abuse   . Anemia   . Chronic low back pain 10/14/2011  . Colon polyps    hyperplastic (2004, 2010) and adenomatous (1990).    . COLONIC POLYPS, HX OF 03/05/2008  . Esophageal stricture    hx of  . Gastric ulcer   . GERD 03/05/2008  . GERD (gastroesophageal reflux disease) 1994   associated peptic strictures  . HYPERLIPIDEMIA 03/05/2008  . HYPERTENSION 03/05/2008  . Hypertension   . IBS 03/05/2008  . Impaired glucose tolerance 10/12/2011  . LEG CRAMPS 09/02/2010  . PAD (peripheral artery disease) ([place]) 10/14/2011  . PAD (peripheral artery disease) ([place]) 2013  . PERIPHERAL EDEMA 09/02/2010  .  PERIPHERAL NEUROPATHY 03/05/2008  . Prostatitis    hx of  . VARICOSE VEINS, LOWER EXTREMITIES 06/09/2009   Past Surgical History:  Procedure Laterality Date  . CATARACT EXTRACTION  bilat  . ESOPHAGOGASTRODUODENOSCOPY N/A 11/14/2014   Procedure: ESOPHAGOGASTRODUODENOSCOPY (EGD);  Surgeon: Jerene Bears, MD;  Location: [facility] ENDOSCOPY;  Service: Endoscopy;  Laterality: N/A;  . LUMBAR SPINE SURGERY  Feb 2010   s/p-Dr Joya Salm  . LUMBAR SPINE SURGERY  11/2008   Dr Joya Salm    reports that he quit smoking about 7 years ago. He does not have any smokeless tobacco history on file. He reports that he drinks alcohol. He reports that he does not use drugs. family history includes Cancer in his mother; Diabetes in his father and sister; Heart disease in his father; Hypertension in his father; Stroke in his father. Allergies  Allergen Reactions  . Lipitor [Atorvastatin Calcium] Other (See Comments)    Myalgia   . Atorvastatin    Current Outpatient Prescriptions on File Prior to Visit  Medication Sig Dispense Refill  . acetaminophen (TYLENOL) 325 MG tablet Take 2 tablets (650 mg total) by mouth every 6 (six) hours as needed for mild pain (or Fever >/= 101).    Marland Kitchen aspirin EC 81 MG tablet Take 81 mg by mouth daily.      Marland Kitchen lisinopril-hydrochlorothiazide (PRINZIDE,ZESTORETIC) 20-25 MG tablet Take 1 tablet by mouth daily. 90 tablet 3  . pravastatin (PRAVACHOL) 40 MG tablet Take 40 mg by mouth daily.     Marland Kitchen  traMADol (ULTRAM) 50 MG tablet Take 1 tablet (50 mg total) by mouth every 6 (six) hours as needed for moderate pain. 60 tablet 1   No current facility-administered medications on file prior to visit.    Review of Systems Constitutional: Negative for increased diaphoresis, or other activity, appetite or siginficant weight change other than noted HENT: Negative for worsening hearing loss, ear pain, facial swelling, mouth sores and neck stiffness.   Eyes: Negative for other worsening pain, redness or visual  disturbance.  Respiratory: Negative for choking or stridor Cardiovascular: Negative for other chest pain and palpitations.  Gastrointestinal: Negative for worsening diarrhea, blood in stool, or abdominal distention Genitourinary: Negative for hematuria, flank pain or change in urine volume.  Musculoskeletal: Negative for myalgias or other joint complaints.  Skin: Negative for other color change and wound or drainage.  Neurological: Negative for syncope and numbness. other than noted Hematological: Negative for adenopathy. or other swelling Psychiatric/Behavioral: Negative for hallucinations, SI, self-injury, decreased concentration or other worsening agitation.  All other system neg per pt    Objective:   Physical Exam BP 140/80   Pulse 90   Temp 98 F (36.7 C) (Oral)   Resp 20   Wt 217 lb (98.4 kg)   SpO2 95%   BMI 29.43 kg/m  VS noted,  Constitutional: Pt is oriented to person, place, and time. Appears well-developed and well-nourished, in no significant distress Head: Normocephalic and atraumatic  Eyes: Conjunctivae and EOM are normal. Pupils are equal, round, and reactive to light Right Ear: External ear normal.  Left Ear: External ear normal Nose: Nose normal.  Mouth/Throat: Oropharynx is clear and moist  Neck: Normal range of motion. Neck supple. No JVD present. No tracheal deviation present or significant neck LA or mass Cardiovascular: Normal rate, regular rhythm, normal heart sounds and intact distal pulses.   Pulmonary/Chest: Effort normal and breath sounds without rales or wheezing  Abdominal: Soft. Bowel sounds are normal. NT. No HSM  Musculoskeletal: Normal range of motion. Exhibits no edema Lymphadenopathy: Has no cervical adenopathy.  Neurological: Pt is alert and oriented to person, place, and time. Pt has normal reflexes. No cranial nerve deficit. Motor grossly intact Skin: Skin is warm and dry. No rash noted or new ulcers Psychiatric:  Has normal mood and  affect. Behavior is normal.  No other new exam findings      Assessment & Plan:

## 2016-08-11 NOTE — Patient Instructions (Signed)
Please take all new medication as prescribed - the elavil 50 mg at bedtime  Please continue all other medications as before, and refills have been done if requested.  Please have the pharmacy call with any other refills you may need.  Please continue your efforts at being more active, low cholesterol diet, and weight control.  You are otherwise up to date with prevention measures today.  Please keep your appointments with your specialists as you may have planned  Please go to the LAB in the Basement (turn left off the elevator) for the tests to be done today  You will be contacted by phone if any changes need to be made immediately.  Otherwise, you will receive a letter about your results with an explanation, but please check with MyChart first.  Please remember to sign up for MyChart if you have not done so, as this will be important to you in the future with finding out test results, communicating by private email, and scheduling acute appointments online when needed.  Please return in 6 months, or sooner if needed

## 2016-08-11 NOTE — Progress Notes (Signed)
Pre visit review using our clinic review tool, if applicable. No additional management support is needed unless otherwise documented below in the visit note. 

## 2016-08-12 ENCOUNTER — Other Ambulatory Visit (INDEPENDENT_AMBULATORY_CARE_PROVIDER_SITE_OTHER): Payer: PPO

## 2016-08-12 ENCOUNTER — Telehealth: Payer: Self-pay | Admitting: Emergency Medicine

## 2016-08-12 ENCOUNTER — Other Ambulatory Visit: Payer: Self-pay | Admitting: Internal Medicine

## 2016-08-12 ENCOUNTER — Telehealth: Payer: Self-pay | Admitting: Internal Medicine

## 2016-08-12 DIAGNOSIS — D649 Anemia, unspecified: Secondary | ICD-10-CM

## 2016-08-12 DIAGNOSIS — D509 Iron deficiency anemia, unspecified: Secondary | ICD-10-CM

## 2016-08-12 LAB — IBC PANEL
Iron: 12 ug/dL — ABNORMAL LOW (ref 42–165)
SATURATION RATIOS: 2.5 % — AB (ref 20.0–50.0)
Transferrin: 345 mg/dL (ref 212.0–360.0)

## 2016-08-12 NOTE — Telephone Encounter (Signed)
This has been done.

## 2016-08-12 NOTE — Telephone Encounter (Signed)
Pt called and stated he need his prescriptions called into [facility] not [facility]. Can he get that changed. Thanks.

## 2016-08-12 NOTE — Telephone Encounter (Signed)
Corinne to let pt know; iron level is confirmed severe low  To start OTC iron sulfate 325 mg bid now  I have referred to GI to hopefully be seen soon  Please repeat cbc as recommended with last note - Mon or Tues next wk

## 2016-08-13 ENCOUNTER — Encounter: Payer: Self-pay | Admitting: Internal Medicine

## 2016-08-13 NOTE — Telephone Encounter (Signed)
Spoke to patient he is aware

## 2016-08-16 NOTE — Assessment & Plan Note (Signed)
Asympt, for a1c with labs, Lab Results  Component Value Date   HGBA1C 6.0 08/11/2016

## 2016-08-16 NOTE — Assessment & Plan Note (Signed)
Ok for elavil qhs prn,  to f/u any worsening symptoms or concerns

## 2016-08-16 NOTE — Assessment & Plan Note (Signed)

## 2016-08-16 NOTE — Assessment & Plan Note (Signed)
Urged to abstain 

## 2016-08-18 ENCOUNTER — Other Ambulatory Visit: Payer: Self-pay | Admitting: Internal Medicine

## 2016-08-18 ENCOUNTER — Other Ambulatory Visit (INDEPENDENT_AMBULATORY_CARE_PROVIDER_SITE_OTHER): Payer: PPO

## 2016-08-18 ENCOUNTER — Telehealth: Payer: Self-pay

## 2016-08-18 DIAGNOSIS — D649 Anemia, unspecified: Secondary | ICD-10-CM

## 2016-08-18 LAB — CBC WITH DIFFERENTIAL/PLATELET
BASOS ABS: 0.1 10*3/uL (ref 0.0–0.1)
Basophils Relative: 1.3 % (ref 0.0–3.0)
Eosinophils Absolute: 0.2 10*3/uL (ref 0.0–0.7)
Eosinophils Relative: 3.4 % (ref 0.0–5.0)
HCT: 26.3 % — ABNORMAL LOW (ref 39.0–52.0)
Hemoglobin: 7.8 g/dL — CL (ref 13.0–17.0)
LYMPHS ABS: 1.1 10*3/uL (ref 0.7–4.0)
Lymphocytes Relative: 22 % (ref 12.0–46.0)
MCHC: 28.9 g/dL — AB (ref 30.0–36.0)
MCV: 58.7 fl — AB (ref 78.0–100.0)
MONOS PCT: 13.8 % — AB (ref 3.0–12.0)
Monocytes Absolute: 0.7 10*3/uL (ref 0.1–1.0)
NEUTROS ABS: 3.1 10*3/uL (ref 1.4–7.7)
NEUTROS PCT: 59.5 % (ref 43.0–77.0)
PLATELETS: 238 10*3/uL (ref 150.0–400.0)
RBC: 4.48 Mil/uL (ref 4.22–5.81)
RDW: 22.5 % — ABNORMAL HIGH (ref 11.5–15.5)
WBC: 5.2 10*3/uL (ref 4.0–10.5)

## 2016-08-18 NOTE — Telephone Encounter (Signed)
hgb 7.6 hct 26.3

## 2016-08-23 ENCOUNTER — Telehealth: Payer: Self-pay

## 2016-08-23 NOTE — Telephone Encounter (Signed)
Called in amitripyline, lisinopril-hctz, and pravastatin to [facility]--patient has already filled tramadol with written script

## 2016-08-23 NOTE — Telephone Encounter (Signed)
Patient came in today, [facility] on [address] is stating they never recd call from [place] to call in rx's from 11/1---I have tried calling x3 and have been placed on hold, I will continue trying to call in rx today---if patient calls back, can talk with tamara

## 2016-09-01 ENCOUNTER — Encounter (HOSPITAL_COMMUNITY): Payer: Self-pay | Admitting: Physical Medicine and Rehabilitation

## 2016-09-01 ENCOUNTER — Telehealth: Payer: Self-pay | Admitting: Physician Assistant

## 2016-09-01 ENCOUNTER — Emergency Department (HOSPITAL_COMMUNITY): Payer: PPO

## 2016-09-01 ENCOUNTER — Emergency Department (HOSPITAL_COMMUNITY)
Admission: EM | Admit: 2016-09-01 | Discharge: 2016-09-02 | Disposition: A | Discharge status: Home / Self Care | Payer: PPO | Attending: Emergency Medicine | Admitting: Emergency Medicine

## 2016-09-01 DIAGNOSIS — Z79899 Other long term (current) drug therapy: Secondary | ICD-10-CM | POA: Insufficient documentation

## 2016-09-01 DIAGNOSIS — K625 Hemorrhage of anus and rectum: Secondary | ICD-10-CM | POA: Diagnosis present

## 2016-09-01 DIAGNOSIS — Z87891 Personal history of nicotine dependence: Secondary | ICD-10-CM | POA: Insufficient documentation

## 2016-09-01 DIAGNOSIS — K921 Melena: Secondary | ICD-10-CM | POA: Diagnosis not present

## 2016-09-01 DIAGNOSIS — I10 Essential (primary) hypertension: Secondary | ICD-10-CM | POA: Insufficient documentation

## 2016-09-01 LAB — CBC WITH DIFFERENTIAL/PLATELET
BASOS PCT: 0 %
Basophils Absolute: 0 10*3/uL (ref 0.0–0.1)
EOS ABS: 0.1 10*3/uL (ref 0.0–0.7)
EOS PCT: 2 %
HEMATOCRIT: 37.9 % — AB (ref 39.0–52.0)
HEMOGLOBIN: 10.9 g/dL — AB (ref 13.0–17.0)
LYMPHS PCT: 28 %
Lymphs Abs: 1.2 10*3/uL (ref 0.7–4.0)
MCH: 19.9 pg — AB (ref 26.0–34.0)
MCHC: 28.8 g/dL — ABNORMAL LOW (ref 30.0–36.0)
MCV: 69.2 fL — AB (ref 78.0–100.0)
MONOS PCT: 11 %
Monocytes Absolute: 0.5 10*3/uL (ref 0.1–1.0)
NEUTROS PCT: 59 %
Neutro Abs: 2.5 10*3/uL (ref 1.7–7.7)
Platelets: 360 10*3/uL (ref 150–400)
RBC: 5.48 MIL/uL (ref 4.22–5.81)
RDW: 30.4 % — ABNORMAL HIGH (ref 11.5–15.5)
WBC: 4.3 10*3/uL (ref 4.0–10.5)

## 2016-09-01 LAB — URINALYSIS, ROUTINE W REFLEX MICROSCOPIC
BILIRUBIN URINE: NEGATIVE
GLUCOSE, UA: NEGATIVE mg/dL
HGB URINE DIPSTICK: NEGATIVE
Ketones, ur: NEGATIVE mg/dL
Leukocytes, UA: NEGATIVE
Nitrite: NEGATIVE
PROTEIN: NEGATIVE mg/dL
Specific Gravity, Urine: 1.007 (ref 1.005–1.030)
pH: 7 (ref 5.0–8.0)

## 2016-09-01 LAB — COMPREHENSIVE METABOLIC PANEL
ALK PHOS: 95 U/L (ref 38–126)
ALT: 12 U/L — ABNORMAL LOW (ref 17–63)
ANION GAP: 10 (ref 5–15)
AST: 20 U/L (ref 15–41)
Albumin: 4.1 g/dL (ref 3.5–5.0)
BUN: 11 mg/dL (ref 6–20)
CALCIUM: 10 mg/dL (ref 8.9–10.3)
CO2: 26 mmol/L (ref 22–32)
Chloride: 99 mmol/L — ABNORMAL LOW (ref 101–111)
Creatinine, Ser: 1.04 mg/dL (ref 0.61–1.24)
GLUCOSE: 119 mg/dL — AB (ref 65–99)
POTASSIUM: 3.9 mmol/L (ref 3.5–5.1)
Sodium: 135 mmol/L (ref 135–145)
TOTAL PROTEIN: 7 g/dL (ref 6.5–8.1)
Total Bilirubin: 0.5 mg/dL (ref 0.3–1.2)

## 2016-09-01 LAB — TYPE AND SCREEN
ABO/RH(D): O NEG
ANTIBODY SCREEN: NEGATIVE

## 2016-09-01 LAB — I-STAT CHEM 8, ED
BUN: 13 mg/dL (ref 6–20)
CALCIUM ION: 1.27 mmol/L (ref 1.15–1.40)
CREATININE: 1.2 mg/dL (ref 0.61–1.24)
Chloride: 99 mmol/L — ABNORMAL LOW (ref 101–111)
GLUCOSE: 119 mg/dL — AB (ref 65–99)
HCT: 37 % — ABNORMAL LOW (ref 39.0–52.0)
HEMOGLOBIN: 12.6 g/dL — AB (ref 13.0–17.0)
POTASSIUM: 3.9 mmol/L (ref 3.5–5.1)
Sodium: 135 mmol/L (ref 135–145)
TCO2: 25 mmol/L (ref 0–100)

## 2016-09-01 LAB — ABO/RH: ABO/RH(D): O NEG

## 2016-09-01 NOTE — ED Notes (Signed)
MD at bedside. 

## 2016-09-01 NOTE — ED Triage Notes (Addendum)
Pt reports black tarry stools. Ongoing for several weeks. Denies abdominal pain. Pt sent from PCP for evaluation of rectal bleeding and low hemoglobin (7.8).

## 2016-09-01 NOTE — ED Notes (Signed)
EDP at bedside  

## 2016-09-01 NOTE — Telephone Encounter (Signed)
Pt called and states that he thinks he has an ulcer that is bleeding and he is getting very weak. Discussed with pt that he needs to be seen in the ER. Pt thanked me for the call.

## 2016-09-01 NOTE — ED Notes (Signed)
Patient transported to X-ray 

## 2016-09-01 NOTE — ED Provider Notes (Signed)
[facility] DEPT Provider Note   CSN: [id number] Arrival date & time: 09/01/16  1326     History   Chief Complaint Chief Complaint  Patient presents with  . Rectal Bleeding    HPI Vincent Mcclure is a 82 y.o. male.   Rectal Bleeding  Quality:  Black and tarry Amount:  Moderate Duration:  2 weeks Timing:  Constant Chronicity:  Recurrent Context comment:  Hx of ulcers Similar prior episodes: yes   Relieved by:  Nothing Worsened by:  Nothing Ineffective treatments:  None tried Associated symptoms: light-headedness (today only)   Associated symptoms: no abdominal pain, no fever, no loss of consciousness and no vomiting   Risk factors: no anticoagulant use and no NSAID use     Past Medical History:  Diagnosis Date  . Alcohol abuse   . Anemia   . Chronic low back pain 10/14/2011  . Colon polyps    hyperplastic (2004, 2010) and adenomatous (1990).    . COLONIC POLYPS, HX OF 03/05/2008  . Esophageal stricture    hx of  . Gastric ulcer   . GERD 03/05/2008  . GERD (gastroesophageal reflux disease) 1994   associated peptic strictures  . HYPERLIPIDEMIA 03/05/2008  . HYPERTENSION 03/05/2008  . Hypertension   . IBS 03/05/2008  . Impaired glucose tolerance 10/12/2011  . LEG CRAMPS 09/02/2010  . PAD (peripheral artery disease) ([place]) 10/14/2011  . PAD (peripheral artery disease) ([place]) 2013  . PERIPHERAL EDEMA 09/02/2010  . PERIPHERAL NEUROPATHY 03/05/2008  . Prostatitis    hx of  . VARICOSE VEINS, LOWER EXTREMITIES 06/09/2009    Patient Active Problem List   Diagnosis Date Noted  . Alcohol use 08/11/2016  . Left shoulder pain 01/24/2015  . GI bleed 11/14/2014  . Essential hypertension 11/14/2014  . Anemia due to blood loss, acute 11/14/2014  . Alcohol abuse 11/14/2014  . Hyperlipidemia 11/14/2014  . Eye pain   . Acute esophagitis   . Acute gastric ulcer   . Duodenal ulcer disease   . Upper GI bleed 11/13/2014  . Lumbar disc disease 01/30/2014  . Hearing loss  03/23/2013  . Screening for other and unspecified cardiovascular conditions 03/23/2013  . PAD (peripheral artery disease) ([place]) 10/14/2011  . Chronic low back pain 10/14/2011  . Impaired glucose tolerance 10/12/2011  . Preventative health care 10/12/2011  . LEG CRAMPS 09/02/2010  . PERIPHERAL EDEMA 09/02/2010  . VARICOSE VEINS, LOWER EXTREMITIES 06/09/2009  . HYPERLIPIDEMIA 03/05/2008  . ANEMIA-NOS 03/05/2008  . Hereditary and idiopathic peripheral neuropathy 03/05/2008  . HYPERTENSION 03/05/2008  . GERD 03/05/2008  . IBS 03/05/2008  . Blood in stool 03/05/2008  . COLONIC POLYPS, HX OF 03/05/2008    Past Surgical History:  Procedure Laterality Date  . CATARACT EXTRACTION  bilat  . ESOPHAGOGASTRODUODENOSCOPY N/A 11/14/2014   Procedure: ESOPHAGOGASTRODUODENOSCOPY (EGD);  Surgeon: Jerene Bears, MD;  Location: [facility] ENDOSCOPY;  Service: Endoscopy;  Laterality: N/A;  . LUMBAR SPINE SURGERY  Feb 2010   s/p-Dr Joya Salm  . LUMBAR SPINE SURGERY  11/2008   Dr Joya Salm       Home Medications    Prior to Admission medications   Medication Sig Start Date End Date Taking? Authorizing Provider  acetaminophen (TYLENOL) 325 MG tablet Take 2 tablets (650 mg total) by mouth every 6 (six) hours as needed for mild pain (or Fever >/= 101). 11/15/14  Yes Kinnie Feil, MD  amitriptyline (ELAVIL) 50 MG tablet Take 1 tablet (50 mg total) by mouth at bedtime as  needed for sleep (leg pain). 08/11/16  Yes Biagio Borg, MD  ferrous sulfate 325 (65 FE) MG tablet Take 325 mg by mouth daily with breakfast.   Yes Historical Provider, MD  guaiFENesin (MUCINEX) 600 MG 12 hr tablet Take 1,200 mg by mouth 2 (two) times daily as needed for cough or to loosen phlegm.   Yes Historical Provider, MD  lisinopril-hydrochlorothiazide (PRINZIDE,ZESTORETIC) 20-25 MG tablet Take 1 tablet by mouth daily. 08/11/16  Yes Biagio Borg, MD  Multiple Vitamins-Minerals (ADULT ONE DAILY GUMMIES) CHEW Chew 2 each by mouth daily.   Yes  Historical Provider, MD  pravastatin (PRAVACHOL) 40 MG tablet Take 1 tablet (40 mg total) by mouth daily. 08/11/16  Yes Biagio Borg, MD  traMADol (ULTRAM) 50 MG tablet Take 1 tablet (50 mg total) by mouth every 6 (six) hours as needed for moderate pain. 08/11/16  Yes Biagio Borg, MD    Family History Family History  Problem Relation Age of Onset  . Cancer Mother     Brain Cancer  . Diabetes Father   . Heart disease Father     CAD  . Hypertension Father   . Stroke Father   . Diabetes Sister     Social History Social History  Substance Use Topics  . Smoking status: Former Smoker    Quit date: 11/11/2008  . Smokeless tobacco: Never Used  . Alcohol use Yes     Allergies   Lipitor [atorvastatin calcium]   Review of Systems Review of Systems  Constitutional: Negative for chills and fever.  HENT: Negative for ear pain and sore throat.   Eyes: Negative for pain and visual disturbance.  Respiratory: Negative for cough and shortness of breath.   Cardiovascular: Negative for chest pain and palpitations.  Gastrointestinal: Positive for blood in stool, diarrhea (on miralax) and hematochezia. Negative for abdominal distention, abdominal pain, anal bleeding and vomiting.  Genitourinary: Negative for dysuria and hematuria.  Musculoskeletal: Negative for arthralgias and back pain.  Skin: Negative for color change and rash.  Neurological: Positive for light-headedness (today only). Negative for seizures, loss of consciousness and syncope.  All other systems reviewed and are negative.    Physical Exam Updated Vital Signs BP 105/83 (BP Location: Right Arm)   Pulse 107   Temp 98 F (36.7 C) (Oral)   Resp 16   Ht 6' (1.829 m)   Wt 93.9 kg   SpO2 98%   BMI 28.07 kg/m   Physical Exam  Constitutional: He appears well-developed and well-nourished.  HENT:  Head: Normocephalic and atraumatic.  Eyes: Conjunctivae are normal.  Neck: Neck supple.  Cardiovascular: Normal rate and  regular rhythm.   No murmur heard. Pulmonary/Chest: Effort normal and breath sounds normal. No respiratory distress. He has no wheezes. He has no rales.  Abdominal: Soft. He exhibits no distension. There is no tenderness. There is no rebound and no guarding.  Musculoskeletal: He exhibits no edema.  Neurological: He is alert. He exhibits normal muscle tone. Coordination normal.  Skin: Skin is warm and dry.  Psychiatric: He has a normal mood and affect.  Nursing note and vitals reviewed.    ED Treatments / Results  Labs (all labs ordered are listed, but only abnormal results are displayed) Labs Reviewed  CBC WITH DIFFERENTIAL/PLATELET - Abnormal; Notable for the following:       Result Value   Hemoglobin 10.9 (*)    HCT 37.9 (*)    MCV 69.2 (*)    [facility]  19.9 (*)    MCHC 28.8 (*)    RDW 30.4 (*)    All other components within normal limits  COMPREHENSIVE METABOLIC PANEL - Abnormal; Notable for the following:    Chloride 99 (*)    Glucose, Bld 119 (*)    ALT 12 (*)    All other components within normal limits  I-STAT CHEM 8, ED - Abnormal; Notable for the following:    Chloride 99 (*)    Glucose, Bld 119 (*)    Hemoglobin 12.6 (*)    HCT 37.0 (*)    All other components within normal limits  URINALYSIS, ROUTINE W REFLEX MICROSCOPIC (NOT AT [facility])  TYPE AND SCREEN  ABO/RH    EKG  EKG Interpretation  Date/Time:  Wednesday September 01 2016 15:29:47 EST Ventricular Rate:  91 PR Interval:    QRS Duration: 84 QT Interval:  359 QTC Calculation: 442 R Axis:   28 Text Interpretation:  Sinus arrhythmia Sinus arrhythmia is new compared to prior Confirmed by [facility] MD, Frederick ([id number]) on 09/01/2016 6:28:38 PM       Radiology Dg Chest 2 View  Result Date: 09/01/2016 CLINICAL DATA:  Pt reports black tarry stools. Ongoing for several weeks. Denies abdominal pain. Pt sent from PCP for evaluation of rectal bleeding and low hemoglobin (7.8). EXAM: CHEST  2 VIEW COMPARISON:   11/13/2014 FINDINGS: Normal mediastinum and cardiac silhouette. Normal pulmonary vasculature. No evidence of effusion, infiltrate, or pneumothorax. No acute bony abnormality. Degenerative osteophytosis of the spine. Atherosclerotic calcification of the aorta. IMPRESSION: No acute cardiopulmonary process. Atherosclerotic calcification of the aorta. Electronically Signed   By: Suzy Bouchard M.D.   On: 09/01/2016 17:57    Procedures Procedures (including critical care time)  Medications Ordered in ED Medications - No data to display   Initial Impression / Assessment and Plan / ED Course  I have reviewed the triage vital signs and the nursing notes.  Pertinent labs & imaging results that were available during my care of the patient were reviewed by me and considered in my medical decision making (see chart for details).  Clinical Course     82 year old male comes today with several weeks of dark tarry stools. He has no abdominal pain nausea vomiting. He is on MiraLAX for chronic constipation city says they are loose and runny. He had this once about 2 years ago. At that time he was admitted and had endoscopy that showed possible ulcerative disease. At this time he is no longer using NSAIDs. He is not on blood thinners either. Abdomen soft nontender nondistended. He did have a episode of lightheadedness triage with position change. He is mildly tachycardic in the room at 98 sitting. Lungs are clear to auscultation bilaterally. We'll get an EKG to rule out any sort of ischemic changes in the setting of anemia.  Hemoglobin here was 10.8. This is markedly elevated from less than 2 weeks ago. We will recheck this before making any ultimate decisions.  Repeat i-STAT shows hemoglobin of 12, through his patient is not anemic at this time. He is having melenic stools and will need evaluation, however he is hemodynamically stable not tachycardic and can follow-up as an outpatient. Offered the patient  either admission or follow-up he chose follow-up and wants to go home. Feel this is appropriate this time he's safe for discharge. He is given strict return precautions. Vital signs stable time of discharge.  Final Clinical Impressions(s) / ED Diagnoses   Final diagnoses:  Melena  New Prescriptions Discharge Medication List as of 09/01/2016  6:32 PM       Dewaine Conger, MD 09/02/16 3736    Gareth Morgan, MD 09/02/16 1730

## 2016-09-06 ENCOUNTER — Ambulatory Visit (INDEPENDENT_AMBULATORY_CARE_PROVIDER_SITE_OTHER): Payer: PPO | Admitting: Physician Assistant

## 2016-09-06 ENCOUNTER — Encounter: Payer: Self-pay | Admitting: Physician Assistant

## 2016-09-06 VITALS — BP 96/42 | HR 100 | Ht 72.0 in | Wt 208.2 lb

## 2016-09-06 DIAGNOSIS — K5909 Other constipation: Secondary | ICD-10-CM | POA: Diagnosis not present

## 2016-09-06 DIAGNOSIS — K921 Melena: Secondary | ICD-10-CM

## 2016-09-06 DIAGNOSIS — Z86010 Personal history of colonic polyps: Secondary | ICD-10-CM

## 2016-09-06 DIAGNOSIS — D509 Iron deficiency anemia, unspecified: Secondary | ICD-10-CM | POA: Diagnosis not present

## 2016-09-06 MED ORDER — NA SULFATE-K SULFATE-MG SULF 17.5-3.13-1.6 GM/177ML PO SOLN: ORAL | 0 refills | Status: DC

## 2016-09-06 MED ORDER — PANTOPRAZOLE SODIUM 40 MG PO TBEC: 40.0000 mg | DELAYED_RELEASE_TABLET | Freq: Two times a day (BID) | ORAL | 2 refills | Status: DC

## 2016-09-06 NOTE — Progress Notes (Addendum)
Chief Complaint: IDA  HPI:  Mr. Vincent Mcclure is an 82 year old Caucasian male with a past medical history of alcohol abuse, anemia, colon polyps, esophageal stricture, PUD and PAD  who was referred to me by Biagio Borg, MD for a complaint of iron deficiency anemia.  The patient has been seen by our service while in the hospital previously. Last by Dr. Hilarie Fredrickson.    He was recently seen in the ER on 09/01/2016 with a complaint of fatigue and reporting melena for a month. Patient had a hemoglobin of 7.5 on 11/2 and 7.8 on 11/8. At time of ER visit patient seen hemoglobin was 10.8 and rechecked at 12.8. There was low suspicion for acute GI bleed. It was thought fatigue was likely postviral related.    Per chart review patient has seen Dr. Hilarie Fredrickson in the past while in the hospital. EGD was completed on 11/14/2014 for hematemesis with findings of esophagitis with ulceration in the lower third of the esophagus, medium-sized ulcer in the gastric antrum and duodenal inflammation with superficial ulceration in the duodenal bulb. The patient was placed on a twice a day PPI for at least 8 weeks and then daily afterwards and alcohol avoidance was recommended. Patient's last colonoscopy was performed by Dr. Sharlett Iles on 07/14/09 with findings of multiple polyps in the rectum and hemorrhoids. The exam was otherwise normal.   Today, the patient tells me that he was told he was anemic around November 1. It appears labs at that time showed hemoglobin of 7.5. Iron labs on 08/12/16 showed an iron low at 12, percent saturation at 2.5. At that time the patient was told to start twice a day over-the-counter iron supplements by his PCP. He was feeling fairly well until the end of November when he went to the ER as above with a complaint of fatigue. At that time was hemoglobin was found to be normal at 12.8. He tells me that he was told to follow with our office. He does describe seen black stools starting on October 27 and about every  third day when he has a bowel movement. He describes these as sticky. These were before he started his iron supplement and he denies any use of Pepto-Bismol. Patient denies any abdominal pain.   Patient does tell me that he battles chronic constipation and does take a dose of MiraLAX daily which produces a bowel movement every third day.   Overall the patient tells me that he feels well, he was dizzy for "a few days" but blames this on his amitriptyline which he stopped himself yesterday. He does feel somewhat better today. He does describe coughing up a "green mucus phlegm" since the beginning of the month. He is planning to see his primary care doctor in regards to this.   Patient denies fever, chills, bright red blood in his stool, weight loss, fatigue, anorexia, change in bowel habits, nausea, vomiting, heartburn, reflux, abdominal pain or symptoms that awaken him at night.   Past Medical History:  Diagnosis Date  . Alcohol abuse   . Anemia   . Chronic low back pain 10/14/2011  . Colon polyps    hyperplastic (2004, 2010) and adenomatous (1990).    . COLONIC POLYPS, HX OF 03/05/2008  . Esophageal stricture    hx of  . Gastric ulcer   . GERD 03/05/2008  . GERD (gastroesophageal reflux disease) 1994   associated peptic strictures  . HYPERLIPIDEMIA 03/05/2008  . HYPERTENSION 03/05/2008  . Hypertension   .  IBS 03/05/2008  . Impaired glucose tolerance 10/12/2011  . Iron deficiency anemia   . LEG CRAMPS 09/02/2010  . PAD (peripheral artery disease) ([place]) 10/14/2011  . PAD (peripheral artery disease) ([place]) 2013  . PERIPHERAL EDEMA 09/02/2010  . PERIPHERAL NEUROPATHY 03/05/2008  . Prostatitis    hx of  . VARICOSE VEINS, LOWER EXTREMITIES 06/09/2009    Past Surgical History:  Procedure Laterality Date  . CATARACT EXTRACTION  bilat  . ESOPHAGOGASTRODUODENOSCOPY N/A 11/14/2014   Procedure: ESOPHAGOGASTRODUODENOSCOPY (EGD);  Surgeon: Jerene Bears, MD;  Location: [facility] ENDOSCOPY;  Service: Endoscopy;   Laterality: N/A;  . LUMBAR SPINE SURGERY  11/2008   Dr Joya Salm    Current Outpatient Prescriptions  Medication Sig Dispense Refill  . ferrous sulfate 325 (65 FE) MG tablet Take 325 mg by mouth daily with breakfast.    . lisinopril-hydrochlorothiazide (PRINZIDE,ZESTORETIC) 20-25 MG tablet Take 1 tablet by mouth daily. 90 tablet 3  . Multiple Vitamins-Minerals (ADULT ONE DAILY GUMMIES) CHEW Chew 2 each by mouth daily.    . pravastatin (PRAVACHOL) 40 MG tablet Take 1 tablet (40 mg total) by mouth daily. 90 tablet 3  . acetaminophen (TYLENOL) 325 MG tablet Take 2 tablets (650 mg total) by mouth every 6 (six) hours as needed for mild pain (or Fever >/= 101).    Marland Kitchen guaiFENesin (MUCINEX) 600 MG 12 hr tablet Take 1,200 mg by mouth 2 (two) times daily as needed for cough or to loosen phlegm.    . traMADol (ULTRAM) 50 MG tablet Take 1 tablet (50 mg total) by mouth every 6 (six) hours as needed for moderate pain. (Patient not taking: Reported on 09/06/2016) 60 tablet 1   No current facility-administered medications for this visit.     Allergies as of 09/06/2016  . (No Active Allergies)    Family History  Problem Relation Age of Onset  . Cancer Mother     Brain Cancer  . Diabetes Father   . Heart disease Father     CAD  . Hypertension Father   . Stroke Father   . Diabetes Sister   . Stomach cancer Neg Hx   . Pancreatic cancer Neg Hx   . Colon cancer Neg Hx   . Esophageal cancer Neg Hx     Social History   Social History  . Marital status: Married    Spouse name: N/A  . Number of children: 4  . Years of education: N/A   Occupational History  . Retired Engineer, maintenance    Social History Main Topics  . Smoking status: Former Smoker    Quit date: 11/11/2008  . Smokeless tobacco: Never Used  . Alcohol use Yes  . Drug use: No  . Sexual activity: Not on file   Other Topics Concern  . Not on file   Social History Narrative   ** Merged History Encounter **       4 sons    Review  of Systems:     Constitutional: Positive for fatigue No weight loss, fever, chills or weakness HEENT: Eyes: No change in vision               Ears, Nose, Throat:  No change in hearing  Skin: No rash  Cardiovascular: No chest pain Respiratory: Positive for productive cough No SOB Gastrointestinal: See HPI and otherwise negative Genitourinary: No dysuria Neurological: No headache Musculoskeletal: No new joint pain Hematologic: No bruising Psychiatric: No history of depression or anxiety   Physical Exam:  Vital  signs: BP (!) 96/42   Pulse 100 Comment: irregular  Ht 6' (1.829 m)   Wt 208 lb 3.2 oz (94.4 kg)   BMI 28.24 kg/m   Constitutional:   Pleasant Caucasian male appears to be in NAD, Well developed, Well nourished, alert and cooperative Head:  Normocephalic and atraumatic. Eyes:   PEERL, EOMI. No icterus. Conjunctiva pink. Ears:  Normal auditory acuity. Neck:  Supple Throat: Oral cavity and pharynx without inflammation, swelling or lesion.  Respiratory: Respirations even and unlabored. Lungs clear to auscultation bilaterally.   No wheezes, crackles, or rhonchi.  Cardiovascular: Normal S1, S2. No MRG. Regular rate and rhythm. No peripheral edema, cyanosis or pallor.  Gastrointestinal:  Soft, nondistended, nontender. No rebound or guarding. Normal bowel sounds. No appreciable masses or hepatomegaly. Rectal:  Not performed.  Msk:  Symmetrical without gross deformities. Without edema, no deformity or joint abnormality.  Neurologic:  Alert and  oriented x4;  grossly normal neurologically.  Skin:   Dry and intact without significant lesions or rashes. Psychiatric:  Demonstrates good judgement and reason without abnormal affect or behaviors.  RECENT LABS: CBC    Component Value Date/Time   WBC 4.3 09/01/2016 1358   RBC 5.48 09/01/2016 1358   HGB 12.6 (L) 09/01/2016 1633   HCT 37.0 (L) 09/01/2016 1633   PLT 360 09/01/2016 1358   MCV 69.2 (L) 09/01/2016 1358   MCH 19.9 (L)  09/01/2016 1358   MCHC 28.8 (L) 09/01/2016 1358   RDW 30.4 (H) 09/01/2016 1358   LYMPHSABS 1.2 09/01/2016 1358   MONOABS 0.5 09/01/2016 1358   EOSABS 0.1 09/01/2016 1358   BASOSABS 0.0 09/01/2016 1358    CMP     Component Value Date/Time   NA 135 09/01/2016 1633   K 3.9 09/01/2016 1633   CL 99 (L) 09/01/2016 1633   CO2 26 09/01/2016 1358   GLUCOSE 119 (H) 09/01/2016 1633   BUN 13 09/01/2016 1633   CREATININE 1.20 09/01/2016 1633   CALCIUM 10.0 09/01/2016 1358   PROT 7.0 09/01/2016 1358   ALBUMIN 4.1 09/01/2016 1358   AST 20 09/01/2016 1358   ALT 12 (L) 09/01/2016 1358   ALKPHOS 95 09/01/2016 1358   BILITOT 0.5 09/01/2016 1358   GFRNONAA >60 09/01/2016 1358   GFRAA >60 09/01/2016 1358    Assessment: 1. IDA: Patient with iron studies low on 08/12/16, hemoglobin low at 7.5 around that time, most recently on 11/22 this had increased to 12.6 after starting twice a day iron supplementation, history of duodenal ulcer at time of last EGD 2016, reported melena; consider most likely upper GI bleed versus lower versus iron malabsorption 2. Constipation: Chronic for the patient, somewhat better on MiraLAX daily 3. Melena: Reported every 3 days since October per patient 4. Personal h/o colon polyps: last colo 2010  Plan: 1. Recommend the patient proceed with an EGD and colonoscopy for further evaluation. Discussed risks, benefits, limitations and alternatives and the patient agrees to proceed. These will be scheduled with Dr. Hilarie Fredrickson as he was recently seen by him in the hospital. These will be scheduled in the [facility] within the next 1-2 mos. 2. Encouraged the patient to increase his MiraLAX dosing to twice daily in order to have more regular bowel movements. 3. Patient should continue his iron supplementation BID until after time of procedures. 4. Encouraged the patient to take at home blood pressures at least twice daily over the next week to ensure that his blood pressure is not too low, at  time of exam today this was noted to be 96/42. He should follow with his PCP in regards to this 5. Started patient on twice a day Pantoprazole 40 mg #60 with 2 refills 6. Patient noted to de very dizzy at end of exam when walking down the hall. Told him to proceed downstairs to his PCP in regards to this and relation to possible meds vs low blood pressure. Nursing staff safely helped him. 7. Patient to follow in clinic per Dr. Vena Rua recommendations after time of procedures.  Ellouise Newer, PA-C [facility] 09/06/2016, 8:44 AM  Cc: Biagio Borg, MD   Addendum: Reviewed and agree with initial management. Jerene Bears, MD

## 2016-09-06 NOTE — Addendum Note (Signed)
Addended by: [place], Mertice Uffelman E on: 09/06/2016 09:59 AM   Modules accepted: Orders

## 2016-09-06 NOTE — Patient Instructions (Addendum)
  You have been scheduled for an endoscopy and colonoscopy. Please follow the written instructions given to you at your visit today. Please pick up your prep supplies at the pharmacy within the next 1-3 days. If you use inhalers (even only as needed), please bring them with you on the day of your procedure.   We have sent the following medications to your pharmacy for you to pick up at your convenience: Pantoprazole   I appreciate the opportunity to care for you.

## 2016-09-07 ENCOUNTER — Telehealth: Payer: Self-pay | Admitting: Physician Assistant

## 2016-09-07 NOTE — Telephone Encounter (Signed)
The pt was informed that he has a history of gastric ulcer and PPI is a medication used to treat gastric ulcer.  Pt agreed to continue and wil call with any concerns

## 2016-09-29 ENCOUNTER — Encounter: Payer: Self-pay | Admitting: Family Medicine

## 2016-09-29 ENCOUNTER — Ambulatory Visit (INDEPENDENT_AMBULATORY_CARE_PROVIDER_SITE_OTHER): Payer: PPO | Admitting: Family Medicine

## 2016-09-29 VITALS — BP 115/78 | HR 102 | Ht 72.0 in | Wt 210.4 lb

## 2016-09-29 DIAGNOSIS — E785 Hyperlipidemia, unspecified: Secondary | ICD-10-CM

## 2016-09-29 DIAGNOSIS — Z7289 Other problems related to lifestyle: Secondary | ICD-10-CM

## 2016-09-29 DIAGNOSIS — R7302 Impaired glucose tolerance (oral): Secondary | ICD-10-CM | POA: Diagnosis not present

## 2016-09-29 DIAGNOSIS — I10 Essential (primary) hypertension: Secondary | ICD-10-CM

## 2016-09-29 DIAGNOSIS — D509 Iron deficiency anemia, unspecified: Secondary | ICD-10-CM | POA: Diagnosis not present

## 2016-09-29 DIAGNOSIS — Z789 Other specified health status: Secondary | ICD-10-CM

## 2016-09-29 DIAGNOSIS — F1090 Alcohol use, unspecified, uncomplicated: Secondary | ICD-10-CM

## 2016-09-29 DIAGNOSIS — H9191 Unspecified hearing loss, right ear: Secondary | ICD-10-CM

## 2016-09-29 DIAGNOSIS — E663 Overweight: Secondary | ICD-10-CM | POA: Diagnosis not present

## 2016-09-29 DIAGNOSIS — D62 Acute posthemorrhagic anemia: Secondary | ICD-10-CM

## 2016-09-29 NOTE — Progress Notes (Signed)
New patient office visit note:  Impression and Recommendations:    1. Glucose intolerance (impaired glucose tolerance)   2. Essential hypertension   3. Hyperlipidemia, unspecified hyperlipidemia type   4. Overweight (BMI 25.0-29.9)   5. Iron deficiency anemia, unspecified iron deficiency anemia type   6. Alcohol use/ h/o heavy abuse   7. h/o Anemia due to blood loss, acute   8. Hearing loss of right ear, unspecified hearing loss type     HTN (hypertension) Controlled--> cont current meds  Lifestyle changes such as dash diet and engaging in a regular exercise program discussed with patient.  Educational handouts provided  Ambulatory BP monitoring encouraged. Keep log and bring in next OV  Contact us prior with any Q's/ concerns.    Anemia, iron deficiency Stable.  Importance of Fe supp d/c pt  09/01/16: Hemoglobin 10.9 (L)  HCT 37.9 (L)  MCV 69.2 (L)  MCH 19.9 (L)  MCHC 28.8 (L)  RDW 30.4 (H)     Glucose intolerance (impaired glucose tolerance) 08/11/16:  A1c 6.0.  - counseling done re: diet, wt loss and exercise.   - handouts given to support what we discussed.     HLD (hyperlipidemia) LDL well controlled at 68 on 08/11/16.    Cont meds.   LFT's - stable    Overweight (BMI 25.0-29.9) Discussed with patient importance of weight loss to help achieve health goals and how increasing weight, correlates to increasing risk of various diseases. (or increasing risk of not controlling existing diseases.)  - Weight watchers recommended;   Declines nutrition counseling with dietary specialist    Alcohol use/ h/o heavy abuse Encouraged one or less daily.     h/o Anemia due to blood loss, acute Pt recently hospitalized and diagnosed with blood in stool and gastric ulcers.  Pt is scheduled for endoscopy with Dr. Hilarie Fredrickson of GI- for evaluation on 10/13/16.   Cbc - stable; pt Asx.    Hearing loss R >er L.  Was sent to ENT in past for eval-  fall,  2014.  Will await med records. Asked pt to get them for me from ALL past specialists seen     New Prescriptions   No medications on file    Modified Medications   No medications on file    Discontinued Medications   No medications on file     The patient was counseled, risk factors were discussed, anticipatory guidance given.  Gross side effects, risk and benefits, and alternatives of medications discussed with patient.  Patient is aware that all medications have potential side effects and we are unable to predict every side effect or drug-drug interaction that may occur.  Expresses verbal understanding and consents to current therapy plan and treatment regimen.  Return in about 3 months (around 12/28/2016) for BW in 3 mo- reck A1c, cbc,  TIBC, transferrin, ferritin.  Please see AVS handed out to patient at the end of our visit for further patient instructions/ counseling done pertaining to today's office visit.    Note: This document was prepared using Dragon voice recognition software and may include unintentional dictation errors.  ----------------------------------------------------------------------------------------------------------------------    Subjective:    Chief Complaint  Patient presents with  . Establish Care    HPI: Vincent Mcclure is a pleasant 82 y.o. male who presents to [place] at [facility] today to review their medical history with me and establish care.   I asked the patient  to review their chronic problem list with me to ensure everything was updated and accurate.    All recent blood work ordered was reviewed with patient today- The dates included 11/01 and 11/08 and 09/01/2016.   Patient was counseled on all abnormalities and we discussed dietary and lifestyle changes that could help those values (also medications when appropriate).  Extensive health counseling performed and all patient's concerns/ questions were addressed.   Pt  recently hospitalized and diagnosed with blood in stool and gastric ulcers.  Pt is scheduled for endoscopy with Dr. Hilarie Fredrickson of GI- for evaluation on 10/13/16.    Dr Nevada Crane- Dermatology.  Sees him for yrly skin screenings   OA---> pt takes tramadol and tylenol prn  Pre-DM:  Several yrs now.  Pt denies ever being told he had A1c >er 6.5. Diet and lifestyle mod  HTN:   Pt w/ well controlled BP.  ASx.   Ex alcoholic:   Used to drink very heavily but cut back approximately one year ago.  Now he drinks approximately a beer a day or 5-7 per week.      Wt Readings from Last 3 Encounters:  09/29/16 210 lb 6.4 oz (95.4 kg)  09/06/16 208 lb 3.2 oz (94.4 kg)  09/01/16 207 lb (93.9 kg)   BP Readings from Last 3 Encounters:  09/29/16 115/78  09/06/16 (!) 96/42  09/01/16 105/83   Pulse Readings from Last 3 Encounters:  09/29/16 (!) 102  09/06/16 100  09/01/16 107   BMI Readings from Last 3 Encounters:  09/29/16 28.54 kg/m  09/06/16 28.24 kg/m  09/01/16 28.07 kg/m    Patient Care Team    Relationship Specialty Notifications Start End  [name], DO PCP - General Family Medicine  09/29/16   Jerene Bears, MD Consulting Physician Gastroenterology  09/29/16   Allyn Kenner, MD Consulting Physician Dermatology  09/29/16     Patient Active Problem List   Diagnosis Date Noted  . Glucose intolerance (impaired glucose tolerance) 10/12/2011    Priority: High  . HLD (hyperlipidemia) 03/05/2008    Priority: High  . HTN (hypertension) 03/05/2008    Priority: High  . Overweight (BMI 25.0-29.9) 09/29/2016    Priority: Medium  . GI bleed 11/14/2014    Priority: Medium  . Acute esophagitis     Priority: Medium  . Acute gastric ulcer     Priority: Medium  . Duodenal ulcer disease     Priority: Medium  . Anemia, iron deficiency 03/05/2008    Priority: Medium  . GERD 03/05/2008    Priority: Medium  . IBS 03/05/2008    Priority: Medium  . COLONIC POLYPS, HX OF 03/05/2008    Priority:  Medium  . Alcohol use/ h/o heavy abuse 08/11/2016    Priority: Low  . Blood in stool 03/05/2008    Priority: Low  . Left shoulder pain 01/24/2015  . h/o Anemia due to blood loss, acute 11/14/2014  . Eye pain   . Upper GI bleed 11/13/2014  . Lumbar disc disease 01/30/2014  . Hearing loss 03/23/2013  . Screening for other and unspecified cardiovascular conditions 03/23/2013  . PAD (peripheral artery disease) ([place]) 10/14/2011  . Chronic low back pain 10/14/2011  . Preventative health care 10/12/2011  . LEG CRAMPS 09/02/2010  . PERIPHERAL EDEMA 09/02/2010  . VARICOSE VEINS, LOWER EXTREMITIES 06/09/2009  . Hereditary and idiopathic peripheral neuropathy 03/05/2008     Past Medical History:  Diagnosis Date  . Alcohol abuse   . Anemia   .  Chronic low back pain 10/14/2011  . Colon polyps    hyperplastic (2004, 2010) and adenomatous (1990).    . COLONIC POLYPS, HX OF 03/05/2008  . Esophageal stricture    hx of  . Gastric ulcer   . GERD 03/05/2008  . GERD (gastroesophageal reflux disease) 1994   associated peptic strictures  . HYPERLIPIDEMIA 03/05/2008  . HYPERTENSION 03/05/2008  . Hypertension   . IBS 03/05/2008  . Impaired glucose tolerance 10/12/2011  . Iron deficiency anemia   . LEG CRAMPS 09/02/2010  . PAD (peripheral artery disease) ([place]) 10/14/2011  . PAD (peripheral artery disease) ([place]) 2013  . PERIPHERAL EDEMA 09/02/2010  . PERIPHERAL NEUROPATHY 03/05/2008  . Prostatitis    hx of  . VARICOSE VEINS, LOWER EXTREMITIES 06/09/2009     Past Medical History:  Diagnosis Date  . Alcohol abuse   . Anemia   . Chronic low back pain 10/14/2011  . Colon polyps    hyperplastic (2004, 2010) and adenomatous (1990).    . COLONIC POLYPS, HX OF 03/05/2008  . Esophageal stricture    hx of  . Gastric ulcer   . GERD 03/05/2008  . GERD (gastroesophageal reflux disease) 1994   associated peptic strictures  . HYPERLIPIDEMIA 03/05/2008  . HYPERTENSION 03/05/2008  . Hypertension   . IBS  03/05/2008  . Impaired glucose tolerance 10/12/2011  . Iron deficiency anemia   . LEG CRAMPS 09/02/2010  . PAD (peripheral artery disease) ([place]) 10/14/2011  . PAD (peripheral artery disease) ([place]) 2013  . PERIPHERAL EDEMA 09/02/2010  . PERIPHERAL NEUROPATHY 03/05/2008  . Prostatitis    hx of  . VARICOSE VEINS, LOWER EXTREMITIES 06/09/2009     Past Surgical History:  Procedure Laterality Date  . CATARACT EXTRACTION  bilat  . ESOPHAGOGASTRODUODENOSCOPY N/A 11/14/2014   Procedure: ESOPHAGOGASTRODUODENOSCOPY (EGD);  Surgeon: Jerene Bears, MD;  Location: [facility] ENDOSCOPY;  Service: Endoscopy;  Laterality: N/A;  . LUMBAR SPINE SURGERY  11/2008   Dr Joya Salm     Family History  Problem Relation Age of Onset  . Cancer Mother     Brain Cancer  . Diabetes Father   . Heart disease Father     CAD  . Hypertension Father   . Stroke Father   . Diabetes Sister   . Stomach cancer Neg Hx   . Pancreatic cancer Neg Hx   . Colon cancer Neg Hx   . Esophageal cancer Neg Hx      History  Drug Use No    History  Alcohol Use  . 3.6 oz/week  . 2 Glasses of wine, 4 Cans of beer per week    History  Smoking Status  . Former Smoker  . Packs/day: 1.25  . Years: 50.00  . Types: Cigarettes  . Quit date: 11/11/2008  Smokeless Tobacco  . Former Systems developer  . Types: Chew     Patient's Medications  New Prescriptions   No medications on file  Previous Medications   ACETAMINOPHEN (TYLENOL) 325 MG TABLET    Take 2 tablets (650 mg total) by mouth every 6 (six) hours as needed for mild pain (or Fever >/= 101).   FERROUS SULFATE 325 (65 FE) MG TABLET    Take 325 mg by mouth daily with breakfast.   GUAIFENESIN (MUCINEX) 600 MG 12 HR TABLET    Take 1,200 mg by mouth 2 (two) times daily as needed for cough or to loosen phlegm.   LISINOPRIL-HYDROCHLOROTHIAZIDE (PRINZIDE,ZESTORETIC) 20-25 MG TABLET    Take 1 tablet  by mouth daily.   MULTIPLE VITAMINS-MINERALS (ADULT ONE DAILY GUMMIES) CHEW    Chew 2 each by mouth  daily.   NA SULFATE-K SULFATE-MG SULF (SUPREP BOWEL PREP KIT) 17.5-3.13-1.6 GM/180ML SOLN    Use as directed   PANTOPRAZOLE (PROTONIX) 40 MG TABLET    Take 1 tablet (40 mg total) by mouth 2 (two) times daily.   PRAVASTATIN (PRAVACHOL) 40 MG TABLET    Take 1 tablet (40 mg total) by mouth daily.   TRAMADOL (ULTRAM) 50 MG TABLET    Take 1 tablet (50 mg total) by mouth every 6 (six) hours as needed for moderate pain.  Modified Medications   No medications on file  Discontinued Medications   No medications on file    Allergies: Patient has no known allergies.  Review of Systems  Constitutional: Negative for diaphoresis, fever and weight loss.  HENT: Negative for congestion, sore throat and tinnitus.   Eyes: Negative for blurred vision, double vision and photophobia.  Respiratory: Negative for cough and wheezing.   Cardiovascular: Negative for chest pain and palpitations.  Gastrointestinal: Positive for blood in stool. Negative for diarrhea, nausea and vomiting.       Pt recently hospitalized and diagnosed with blood in stool and ulcers.  Pt is scheduled for endoscopy with Dr. Hilarie Fredrickson for evaluation on 10/13/16.  Genitourinary: Negative for dysuria, frequency and urgency.  Musculoskeletal: Positive for myalgias. Negative for joint pain.       Legs  Skin: Negative for rash.  Neurological: Negative for dizziness, focal weakness, weakness and headaches.  Endo/Heme/Allergies: Negative for environmental allergies and polydipsia. Does not bruise/bleed easily.  Psychiatric/Behavioral: Negative for depression and memory loss. The patient is not nervous/anxious and does not have insomnia.      Objective:    Blood pressure 115/78, pulse (!) 102, height 6' (1.829 m), weight 210 lb 6.4 oz (95.4 kg). Body mass index is 28.54 kg/m. General: Well Developed, well nourished, and in no acute distress.  Neuro: Alert and oriented x3, extra-ocular muscles intact, sensation grossly intact.  HEENT:  Normocephalic, atraumatic, pupils equal round reactive to light, neck supple Skin: no gross rashes  Cardiac: Regular rate and rhythm Respiratory: Essentially clear to auscultation bilaterally. Not using accessory muscles, speaking in full sentences.  Abdominal: not grossly distended Musculoskeletal: Ambulates w/o diff, FROM * 4 ext.  Vasc: less 2 sec cap RF, warm and pink  Psych:  No HI/SI, judgement and insight good, Euthymic mood. Full Affect.  Recent Results (from the past 2160 hour(s))  Hemoglobin A1c     Status: None   Collection Time: 08/11/16  3:21 PM  Result Value Ref Range   Hgb A1c MFr Bld 6.0 4.6 - 6.5 %    Comment: Glycemic Control Guidelines for People with Diabetes:Non Diabetic:  <6%Goal of Therapy: <7%Additional Action Suggested:  >8%   Lipid panel     Status: None   Collection Time: 08/11/16  3:21 PM  Result Value Ref Range   Cholesterol 135 0 - 200 mg/dL    Comment: ATP III Classification       Desirable:  < 200 mg/dL               Borderline High:  200 - 239 mg/dL          High:  > = 240 mg/dL   Triglycerides 74.0 0.0 - 149.0 mg/dL    Comment: Normal:  <150 mg/dLBorderline High:  150 - 199 mg/dL   HDL 52.00 >39.00 mg/dL  VLDL 14.8 0.0 - 40.0 mg/dL   LDL Cholesterol 68 0 - 99 mg/dL   Total CHOL/HDL Ratio 3     Comment:                Men          Women1/2 Average Risk     3.4          3.3Average Risk          5.0          4.42X Average Risk          9.6          7.13X Average Risk          15.0          11.0                       NonHDL 82.90     Comment: NOTE:  Non-HDL goal should be 30 mg/dL higher than patient's LDL goal (i.e. LDL goal of < 70 mg/dL, would have non-HDL goal of < 100 mg/dL)  Basic metabolic panel     Status: Abnormal   Collection Time: 08/11/16  3:21 PM  Result Value Ref Range   Sodium 139 135 - 145 mEq/L   Potassium 4.0 3.5 - 5.1 mEq/L   Chloride 106 96 - 112 mEq/L   CO2 24 19 - 32 mEq/L   Glucose, Bld 101 (H) 70 - 99 mg/dL   BUN 14 6 - 23  mg/dL   Creatinine, Ser 0.84 0.40 - 1.50 mg/dL   Calcium 9.2 8.4 - 10.5 mg/dL   GFR 92.83 >60.00 mL/min  Hepatic function panel     Status: None   Collection Time: 08/11/16  3:21 PM  Result Value Ref Range   Total Bilirubin 0.5 0.2 - 1.2 mg/dL   Bilirubin, Direct 0.2 0.0 - 0.3 mg/dL   Alkaline Phosphatase 85 39 - 117 U/L   AST 10 0 - 37 U/L   ALT 6 0 - 53 U/L   Total Protein 6.8 6.0 - 8.3 g/dL   Albumin 4.1 3.5 - 5.2 g/dL  CBC with Differential/Platelet     Status: Abnormal   Collection Time: 08/11/16  3:21 PM  Result Value Ref Range   WBC 6.6 4.0 - 10.5 K/uL   RBC 4.51 4.22 - 5.81 Mil/uL   Hemoglobin 7.5 Repeated and verified X2. (LL) 13.0 - 17.0 g/dL   HCT 25.7 Repeated and verified X2. (L) 39.0 - 52.0 %   MCV 56.9 Repeated and verified X2. (L) 78.0 - 100.0 fl   MCHC 29.3 (L) 30.0 - 36.0 g/dL   RDW 21.9 (H) 11.5 - 15.5 %   Platelets 251.0 150.0 - 400.0 K/uL   Neutrophils Relative % 67.6 43.0 - 77.0 %   Lymphocytes Relative 19.8 12.0 - 46.0 %   Monocytes Relative 9.2 3.0 - 12.0 %   Eosinophils Relative 2.9 0.0 - 5.0 %   Basophils Relative 0.5 0.0 - 3.0 %   Neutro Abs 4.5 1.4 - 7.7 K/uL   Lymphs Abs 1.3 0.7 - 4.0 K/uL   Monocytes Absolute 0.6 0.1 - 1.0 K/uL   Eosinophils Absolute 0.2 0.0 - 0.7 K/uL   Basophils Absolute 0.0 0.0 - 0.1 K/uL  TSH     Status: None   Collection Time: 08/11/16  3:21 PM  Result Value Ref Range   TSH 1.10 0.35 - 4.50 uIU/mL  Urinalysis, Routine w reflex  microscopic (not at [facility])     Status: Abnormal   Collection Time: 08/11/16  3:21 PM  Result Value Ref Range   Color, Urine YELLOW Yellow;Lt. Yellow   APPearance CLEAR Clear   Specific Gravity, Urine 1.020 1.000 - 1.030   pH 6.0 5.0 - 8.0   Total Protein, Urine NEGATIVE Negative   Urine Glucose NEGATIVE Negative   Ketones, ur NEGATIVE Negative   Bilirubin Urine NEGATIVE Negative   Hgb urine dipstick NEGATIVE Negative   Urobilinogen, UA 1.0 0.0 - 1.0   Leukocytes, UA NEGATIVE Negative    Nitrite NEGATIVE Negative   WBC, UA 0-2/hpf 0-2/hpf   RBC / HPF 0-2/hpf 0-2/hpf   Mucus, UA Presence of (A) None   Squamous Epithelial / LPF Rare(0-4/hpf) Rare(0-4/hpf)   Hyaline Casts, UA Presence of (A) None  IBC panel     Status: Abnormal   Collection Time: 08/12/16  1:24 PM  Result Value Ref Range   Iron 12 (L) 42 - 165 ug/dL   Transferrin 345.0 212.0 - 360.0 mg/dL   Saturation Ratios 2.5 (L) 20.0 - 50.0 %  CBC with Differential/Platelet     Status: Abnormal   Collection Time: 08/18/16  2:47 PM  Result Value Ref Range   WBC 5.2 4.0 - 10.5 K/uL   RBC 4.48 4.22 - 5.81 Mil/uL   Hemoglobin 7.8 Repeated and verified X2. (LL) 13.0 - 17.0 g/dL   HCT 26.3 Repeated and verified X2. (L) 39.0 - 52.0 %   MCV 58.7 (L) 78.0 - 100.0 fl   MCHC 28.9 (L) 30.0 - 36.0 g/dL   RDW 22.5 (H) 11.5 - 15.5 %   Platelets 238.0 150.0 - 400.0 K/uL   Neutrophils Relative % 59.5 43.0 - 77.0 %   Lymphocytes Relative 22.0 12.0 - 46.0 %   Monocytes Relative 13.8 (H) 3.0 - 12.0 %   Eosinophils Relative 3.4 0.0 - 5.0 %   Basophils Relative 1.3 0.0 - 3.0 %   Neutro Abs 3.1 1.4 - 7.7 K/uL   Lymphs Abs 1.1 0.7 - 4.0 K/uL   Monocytes Absolute 0.7 0.1 - 1.0 K/uL   Eosinophils Absolute 0.2 0.0 - 0.7 K/uL   Basophils Absolute 0.1 0.0 - 0.1 K/uL  CBC with Differential     Status: Abnormal   Collection Time: 09/01/16  1:58 PM  Result Value Ref Range   WBC 4.3 4.0 - 10.5 K/uL   RBC 5.48 4.22 - 5.81 MIL/uL   Hemoglobin 10.9 (L) 13.0 - 17.0 g/dL   HCT 37.9 (L) 39.0 - 52.0 %   MCV 69.2 (L) 78.0 - 100.0 fL   MCH 19.9 (L) 26.0 - 34.0 pg   MCHC 28.8 (L) 30.0 - 36.0 g/dL   RDW 30.4 (H) 11.5 - 15.5 %   Platelets 360 150 - 400 K/uL    Comment: PLATELET COUNT CONFIRMED BY SMEAR   Neutrophils Relative % 59 %   Lymphocytes Relative 28 %   Monocytes Relative 11 %   Eosinophils Relative 2 %   Basophils Relative 0 %   Neutro Abs 2.5 1.7 - 7.7 K/uL   Lymphs Abs 1.2 0.7 - 4.0 K/uL   Monocytes Absolute 0.5 0.1 - 1.0 K/uL    Eosinophils Absolute 0.1 0.0 - 0.7 K/uL   Basophils Absolute 0.0 0.0 - 0.1 K/uL   RBC Morphology ELLIPTOCYTES   Comprehensive metabolic panel     Status: Abnormal   Collection Time: 09/01/16  1:58 PM  Result Value Ref Range  Sodium 135 135 - 145 mmol/L   Potassium 3.9 3.5 - 5.1 mmol/L   Chloride 99 (L) 101 - 111 mmol/L   CO2 26 22 - 32 mmol/L   Glucose, Bld 119 (H) 65 - 99 mg/dL   BUN 11 6 - 20 mg/dL   Creatinine, Ser 1.04 0.61 - 1.24 mg/dL   Calcium 10.0 8.9 - 10.3 mg/dL   Total Protein 7.0 6.5 - 8.1 g/dL   Albumin 4.1 3.5 - 5.0 g/dL   AST 20 15 - 41 U/L   ALT 12 (L) 17 - 63 U/L   Alkaline Phosphatase 95 38 - 126 U/L   Total Bilirubin 0.5 0.3 - 1.2 mg/dL   GFR calc non Af Amer >60 >60 mL/min   GFR calc Af Amer >60 >60 mL/min    Comment: (NOTE) The eGFR has been calculated using the CKD EPI equation. This calculation has not been validated in all clinical situations. eGFR's persistently <60 mL/min signify possible Chronic Kidney Disease.    Anion gap 10 5 - 15  Type and screen [place]     Status: None   Collection Time: 09/01/16  2:10 PM  Result Value Ref Range   ABO/RH(D) O NEG    Antibody Screen NEG    Sample Expiration 09/04/2016   ABO/Rh     Status: None   Collection Time: 09/01/16  2:10 PM  Result Value Ref Range   ABO/RH(D) O NEG   I-Stat Chem 8, ED     Status: Abnormal   Collection Time: 09/01/16  4:33 PM  Result Value Ref Range   Sodium 135 135 - 145 mmol/L   Potassium 3.9 3.5 - 5.1 mmol/L   Chloride 99 (L) 101 - 111 mmol/L   BUN 13 6 - 20 mg/dL   Creatinine, Ser 1.20 0.61 - 1.24 mg/dL   Glucose, Bld 119 (H) 65 - 99 mg/dL   Calcium, Ion 1.27 1.15 - 1.40 mmol/L   TCO2 25 0 - 100 mmol/L   Hemoglobin 12.6 (L) 13.0 - 17.0 g/dL   HCT 37.0 (L) 39.0 - 52.0 %  Urinalysis, Routine w reflex microscopic (not at [facility])     Status: None   Collection Time: 09/01/16  5:27 PM  Result Value Ref Range   Color, Urine YELLOW YELLOW   APPearance CLEAR  CLEAR   Specific Gravity, Urine 1.007 1.005 - 1.030   pH 7.0 5.0 - 8.0   Glucose, UA NEGATIVE NEGATIVE mg/dL   Hgb urine dipstick NEGATIVE NEGATIVE   Bilirubin Urine NEGATIVE NEGATIVE   Ketones, ur NEGATIVE NEGATIVE mg/dL   Protein, ur NEGATIVE NEGATIVE mg/dL   Nitrite NEGATIVE NEGATIVE   Leukocytes, UA NEGATIVE NEGATIVE    Comment: MICROSCOPIC NOT DONE ON URINES WITH NEGATIVE PROTEIN, BLOOD, LEUKOCYTES, NITRITE, OR GLUCOSE <1000 mg/dL.

## 2016-09-29 NOTE — Patient Instructions (Addendum)
Prediabetes Eating Plan Prediabetes--also called impaired glucose tolerance or impaired fasting glucose--is a condition that causes blood sugar (blood glucose) levels to be higher than normal. Following a healthy diet can help to keep prediabetes under control. It can also help to lower the risk of type 2 diabetes and heart disease, which are increased in people who have prediabetes. Along with regular exercise, a healthy diet:  Promotes weight loss.  Helps to control blood sugar levels.  Helps to improve the way that the body uses insulin. WHAT DO I NEED TO KNOW ABOUT THIS EATING PLAN?  Use the glycemic index (GI) to plan your meals. The index tells you how quickly a food will raise your blood sugar. Choose low-GI foods. These foods take a longer time to raise blood sugar.  Pay close attention to the amount of carbohydrates in the food that you eat. Carbohydrates increase blood sugar levels.  Keep track of how many calories you take in. Eating the right amount of calories will help you to achieve a healthy weight. Losing about 7 percent of your starting weight can help to prevent type 2 diabetes.  You may want to follow a Mediterranean diet. This diet includes a lot of vegetables, lean meats or fish, whole grains, fruits, and healthy oils and fats. WHAT FOODS CAN I EAT? Grains Whole grains, such as whole-wheat or whole-grain breads, crackers, cereals, and pasta. Unsweetened oatmeal. Bulgur. Barley. Quinoa. Bussie rice. Corn or whole-wheat flour tortillas or taco shells. Vegetables Lettuce. Spinach. Peas. Beets. Cauliflower. Cabbage. Broccoli. Carrots. Tomatoes. Squash. Eggplant. Herbs. Peppers. Onions. Cucumbers. Brussels sprouts. Fruits Berries. Bananas. Apples. Oranges. Grapes. Papaya. Mango. Pomegranate. Kiwi. Grapefruit. Cherries. Meats and Other Protein Sources Seafood. Lean meats, such as chicken and [place] or lean cuts of pork and beef. Tofu. Eggs. Nuts. Beans. Dairy Low-fat or  fat-free dairy products, such as yogurt, cottage cheese, and cheese. Beverages Water. Tea. Coffee. Sugar-free or diet soda. Seltzer water. Milk. Milk alternatives, such as soy or almond milk. Condiments Mustard. Relish. Low-fat, low-sugar ketchup. Low-fat, low-sugar barbecue sauce. Low-fat or fat-free mayonnaise. Sweets and Desserts Sugar-free or low-fat pudding. Sugar-free or low-fat ice cream and other frozen treats. Fats and Oils Avocado. Walnuts. Olive oil. The items listed above may not be a complete list of recommended foods or beverages. Contact your dietitian for more options.  WHAT FOODS ARE NOT RECOMMENDED? Grains Refined white flour and flour products, such as bread, pasta, snack foods, and cereals. Beverages Sweetened drinks, such as sweet iced tea and soda. Sweets and Desserts Baked goods, such as cake, cupcakes, pastries, cookies, and cheesecake. The items listed above may not be a complete list of foods and beverages to avoid. Contact your dietitian for more information.   This information is not intended to replace advice given to you by your health care provider. Make sure you discuss any questions you have with your health care provider.   Document Released: 02/11/2015 Document Reviewed: 02/11/2015 Elsevier Interactive Patient Education 2016 [place] factors for prediabetes and type 2 diabetes  Researchers don't fully understand why some people develop prediabetes and type 2 diabetes and others don't.  It's clear that certain factors increase the risk, however, including:  Weight. The more fatty tissue you have, the more resistant your cells become to insulin.  Inactivity. The less active you are, the greater your risk. Physical activity helps you control your weight, uses up glucose as energy and makes your cells more sensitive to insulin.  Family history. Your risk increases if a parent or sibling has type 2 diabetes.  Race. Although it's unclear  why, people of certain races - including blacks, Hispanics, American Indians and Asian-Americans - are at higher risk.  Age. Your risk increases as you get older. This may be because you tend to exercise less, lose muscle mass and gain weight as you age. But type 2 diabetes is also increasing dramatically among children, adolescents and younger adults.  Gestational diabetes. If you developed gestational diabetes when you were pregnant, your risk of developing prediabetes and type 2 diabetes later increases. If you gave birth to a baby weighing more than 9 pounds (4 kilograms), you're also at risk of type 2 diabetes.  Polycystic ovary syndrome. For women, having polycystic ovary syndrome - a common condition characterized by irregular menstrual periods, excess hair growth and obesity - increases the risk of diabetes.  High blood pressure. Having blood pressure over 140/90 millimeters of mercury (mm Hg) is linked to an increased risk of type 2 diabetes.  Abnormal cholesterol and triglyceride levels. If you have low levels of high-density lipoprotein (HDL), or "good," cholesterol, your risk of type 2 diabetes is higher. Triglycerides are another type of fat carried in the blood. People with high levels of triglycerides have an increased risk of type 2 diabetes. Your doctor can let you know what your cholesterol and triglyceride levels are.    A good guide to good carbs: The glycemic index ---If you have diabetes, or at risk for diabetes, you know all too well that when you eat carbohydrates, your blood sugar goes up. The total amount of carbs you consume at a meal or in a snack mostly determines what your blood sugar will do. But the food itself also plays a role. A serving of white rice has almost the same effect as eating pure table sugar - a quick, high spike in blood sugar. A serving of lentils has a slower, smaller effect.  ---Picking good sources of carbs can help you control your blood sugar and your  weight. Even if you don't have diabetes, eating healthier carbohydrate-rich foods can help ward off a host of chronic conditions, from heart disease to various cancers to, well, diabetes.  ---One way to choose foods is with the glycemic index (GI). This tool measures how much a food boosts blood sugar.  The glycemic index rates the effect of a specific amount of a food on blood sugar compared with the same amount of pure glucose. A food with a glycemic index of 28 boosts blood sugar only 28% as much as pure glucose. One with a GI of 95 acts like pure glucose.  High glycemic foods result in a quick spike in insulin and blood sugar (also known as blood glucose).  Low glycemic foods have a slower, smaller effect- these are healthier for you.   Using the glycemic index Using the glycemic index is easy: choose foods in the low GI category instead of those in the high GI category (see below), and go easy on those in between. Low glycemic index (GI of 55 or less): Most fruits and vegetables, beans, minimally processed grains, pasta, low-fat dairy foods, and nuts.  Moderate glycemic index (GI 56 to 69): White and sweet potatoes, corn, white rice, couscous, breakfast cereals such as Cream of Wheat and Mini Wheats.  High glycemic index (GI of 70 or higher): White bread, rice cakes, most crackers, bagels, cakes, doughnuts, croissants, most packaged breakfast cereals. You  can see the values for 100 commons foods and get links to more at www.[email or web address].si.  Swaps for lowering glycemic index  Instead of this high-glycemic index food Eat this lower-glycemic index food  White rice Cowles rice or converted rice  Instant oatmeal Steel-cut oats  Cornflakes Bran flakes  Baked potato Pasta, bulgur  White bread Whole-grain bread  Corn Peas or leafy greens     Please realize, EXERCISE IS MEDICINE!  -  American Heart Association ( AHA) guidelines for exercise : If you are in good health, without any  medical conditions, you should engage in 150 minutes of moderate intensity aerobic activity per week.  This means you should be huffing and puffing throughout your workout.   Engaging in regular exercise will improve brain function and memory, as well as improve mood, boost immune system and help with weight management.  As well as the other, more well-known effects of exercise such as decreasing blood sugar levels, decreasing blood pressure,  and decreasing bad cholesterol levels/ increasing good cholesterol levels.     -  The AHA strongly endorses consumption of a diet that contains a variety of foods from all the food categories with an emphasis on fruits and vegetables; fat-free and low-fat dairy products; cereal and grain products; legumes and nuts; and fish, poultry, and/or extra lean meats.    Excessive food intake, especially of foods high in saturated and trans fats, sugar, and salt, should be avoided.    Adequate water intake of roughly 1/2 of your weight in pounds, should equal the ounces of water per day you should drink.  So for instance, if you're 200 pounds, that would be 100 ounces of water per day.         Mediterranean Diet  Why follow it? Research shows. . Those who follow the Mediterranean diet have a reduced risk of heart disease  . The diet is associated with a reduced incidence of Parkinson's and Alzheimer's diseases . People following the diet may have longer life expectancies and lower rates of chronic diseases  . The Dietary Guidelines for Americans recommends the Mediterranean diet as an eating plan to promote health and prevent disease  What Is the Mediterranean Diet?  . Healthy eating plan based on typical foods and recipes of Mediterranean-style cooking . The diet is primarily a plant based diet; these foods should make up a majority of meals   Starches - Plant based foods should make up a majority of meals - They are an important sources of vitamins, minerals,  energy, antioxidants, and fiber - Choose whole grains, foods high in fiber and minimally processed items  - Typical grain sources include wheat, oats, barley, corn, [place] rice, bulgar, farro, millet, polenta, couscous  - Various types of beans include chickpeas, lentils, fava beans, black beans, white beans   Fruits  Veggies - Large quantities of antioxidant rich fruits & veggies; 6 or more servings  - Vegetables can be eaten raw or lightly drizzled with oil and cooked  - Vegetables common to the traditional Mediterranean Diet include: artichokes, arugula, beets, broccoli, brussel sprouts, cabbage, carrots, celery, collard greens, cucumbers, eggplant, kale, leeks, lemons, lettuce, mushrooms, okra, onions, peas, peppers, potatoes, pumpkin, radishes, rutabaga, shallots, spinach, sweet potatoes, turnips, zucchini - Fruits common to the Mediterranean Diet include: apples, apricots, avocados, cherries, clementines, dates, figs, grapefruits, grapes, melons, nectarines, oranges, peaches, pears, pomegranates, strawberries, tangerines  Fats - Replace butter and margarine with healthy oils, such as olive oil, canola oil, and  tahini  - Limit nuts to no more than a handful a day  - Nuts include walnuts, almonds, pecans, pistachios, pine nuts  - Limit or avoid candied, honey roasted or heavily salted nuts - Olives are central to the Mediterranean diet - can be eaten whole or used in a variety of dishes   Meats Protein - Limiting red meat: no more than a few times a month - When eating red meat: choose lean cuts and keep the portion to the size of deck of cards - Eggs: approx. 0 to 4 times a week  - Fish and lean poultry: at least 2 a week  - Healthy protein sources include, chicken, [place], lean beef, lamb - Increase intake of seafood such as tuna, salmon, trout, mackerel, shrimp, scallops - Avoid or limit high fat processed meats such as sausage and bacon  Dairy - Include moderate amounts of low fat dairy  products  - Focus on healthy dairy such as fat free yogurt, skim milk, low or reduced fat cheese - Limit dairy products higher in fat such as whole or 2% milk, cheese, ice cream  Alcohol - Moderate amounts of red wine is ok  - No more than 5 oz daily for women (all ages) and men older than age 62  - No more than 10 oz of wine daily for men younger than 58  Other - Limit sweets and other desserts  - Use herbs and spices instead of salt to flavor foods  - Herbs and spices common to the traditional Mediterranean Diet include: basil, bay leaves, chives, cloves, cumin, fennel, garlic, lavender, marjoram, mint, oregano, parsley, pepper, rosemary, sage, savory, sumac, tarragon, thyme   It's not just a diet, it's a lifestyle:  . The Mediterranean diet includes lifestyle factors typical of those in the region  . Foods, drinks and meals are best eaten with others and savored . Daily physical activity is important for overall good health . This could be strenuous exercise like running and aerobics . This could also be more leisurely activities such as walking, housework, yard-work, or taking the stairs . Moderation is the key; a balanced and healthy diet accommodates most foods and drinks . Consider portion sizes and frequency of consumption of certain foods   Meal Ideas & Options:  . Breakfast:  o Whole wheat toast or whole wheat English muffins with peanut butter & hard boiled egg o Steel cut oats topped with apples & cinnamon and skim milk  o Fresh fruit: banana, strawberries, melon, berries, peaches  o Smoothies: strawberries, bananas, greek yogurt, peanut butter o Low fat greek yogurt with blueberries and granola  o Egg white omelet with spinach and mushrooms o Breakfast couscous: whole wheat couscous, apricots, skim milk, cranberries  . Sandwiches:  o Hummus and grilled vegetables (peppers, zucchini, squash) on whole wheat bread   o Grilled chicken on whole wheat pita with lettuce, tomatoes,  cucumbers or tzatziki  o Tuna salad on whole wheat bread: tuna salad made with greek yogurt, olives, red peppers, capers, green onions o Garlic rosemary lamb pita: lamb sauted with garlic, rosemary, salt & pepper; add lettuce, cucumber, greek yogurt to pita - flavor with lemon juice and black pepper  . Seafood:  o Mediterranean grilled salmon, seasoned with garlic, basil, parsley, lemon juice and black pepper o Shrimp, lemon, and spinach whole-grain pasta salad made with low fat greek yogurt  o Seared scallops with lemon orzo  o Seared tuna steaks seasoned salt, pepper, coriander  topped with tomato mixture of olives, tomatoes, olive oil, minced garlic, parsley, green onions and cappers  . Meats:  o Herbed greek chicken salad with kalamata olives, cucumber, feta  o Red bell peppers stuffed with spinach, bulgur, lean ground beef (or lentils) & topped with feta   o Kebabs: skewers of chicken, tomatoes, onions, zucchini, squash  o [place] burgers: made with red onions, mint, dill, lemon juice, feta cheese topped with roasted red peppers . Vegetarian o Cucumber salad: cucumbers, artichoke hearts, celery, red onion, feta cheese, tossed in olive oil & lemon juice  o Hummus and whole grain pita points with a greek salad (lettuce, tomato, feta, olives, cucumbers, red onion) o Lentil soup with celery, carrots made with vegetable broth, garlic, salt and pepper  o Tabouli salad: parsley, bulgur, mint, scallions, cucumbers, tomato, radishes, lemon juice, olive oil, salt and pepper.

## 2016-10-13 ENCOUNTER — Encounter: Payer: PPO | Admitting: Internal Medicine

## 2016-10-30 NOTE — Assessment & Plan Note (Addendum)
Controlled--> cont current meds  Lifestyle changes such as dash diet and engaging in a regular exercise program discussed with patient.  Educational handouts provided  Ambulatory BP monitoring encouraged. Keep log and bring in next OV  Contact us prior with any Q's/ concerns.

## 2016-10-31 NOTE — Assessment & Plan Note (Signed)
LDL well controlled at 68 on 08/11/16.    Cont meds.   LFT's - stable

## 2016-10-31 NOTE — Assessment & Plan Note (Addendum)
Discussed with patient importance of weight loss to help achieve health goals and how increasing weight, correlates to increasing risk of various diseases. (or increasing risk of not controlling existing diseases.)  - Weight watchers recommended;   Declines nutrition counseling with dietary specialist

## 2016-10-31 NOTE — Assessment & Plan Note (Addendum)
Stable.  Importance of Fe supp d/c pt  09/01/16: Hemoglobin 10.9 (L)  HCT 37.9 (L)  MCV 69.2 (L)  MCH 19.9 (L)  MCHC 28.8 (L)  RDW 30.4 (H)

## 2016-10-31 NOTE — Assessment & Plan Note (Addendum)
Pt recently hospitalized and diagnosed with blood in stool and gastric ulcers.  Pt is scheduled for endoscopy with Dr. Hilarie Fredrickson of [facility]- for evaluation on 10/13/16.   Cbc - stable; pt Asx.

## 2016-10-31 NOTE — Assessment & Plan Note (Addendum)
R >er L.  Was sent to ENT in past for eval-  fall, 2014.  Will await med records. Asked pt to get them for me from ALL past specialists seen

## 2016-10-31 NOTE — Assessment & Plan Note (Signed)
Encouraged one or less daily.

## 2016-10-31 NOTE — Assessment & Plan Note (Signed)
08/11/16:  A1c 6.0.  - counseling done re: diet, wt loss and exercise.   - handouts given to support what we discussed.

## 2016-12-27 ENCOUNTER — Ambulatory Visit: Payer: PPO | Admitting: Family Medicine

## 2017-01-27 ENCOUNTER — Other Ambulatory Visit: Payer: Self-pay | Admitting: Internal Medicine

## 2017-02-03 ENCOUNTER — Ambulatory Visit: Payer: PPO | Admitting: Family Medicine

## 2017-02-11 ENCOUNTER — Inpatient Hospital Stay (HOSPITAL_COMMUNITY)
Admission: EM | Admit: 2017-02-11 | Discharge: 2017-02-25 | DRG: 233 | Disposition: A | Discharge status: Home / Self Care | Payer: Medicare HMO | Attending: Cardiothoracic Surgery | Admitting: Cardiothoracic Surgery

## 2017-02-11 ENCOUNTER — Encounter (HOSPITAL_COMMUNITY): Payer: Self-pay | Admitting: *Deleted

## 2017-02-11 ENCOUNTER — Emergency Department (HOSPITAL_COMMUNITY): Payer: Medicare HMO

## 2017-02-11 DIAGNOSIS — E785 Hyperlipidemia, unspecified: Secondary | ICD-10-CM | POA: Diagnosis present

## 2017-02-11 DIAGNOSIS — I130 Hypertensive heart and chronic kidney disease with heart failure and stage 1 through stage 4 chronic kidney disease, or unspecified chronic kidney disease: Secondary | ICD-10-CM | POA: Diagnosis present

## 2017-02-11 DIAGNOSIS — Z789 Other specified health status: Secondary | ICD-10-CM | POA: Diagnosis present

## 2017-02-11 DIAGNOSIS — Z01810 Encounter for preprocedural cardiovascular examination: Secondary | ICD-10-CM | POA: Diagnosis not present

## 2017-02-11 DIAGNOSIS — Z8711 Personal history of peptic ulcer disease: Secondary | ICD-10-CM | POA: Diagnosis not present

## 2017-02-11 DIAGNOSIS — M5136 Other intervertebral disc degeneration, lumbar region: Secondary | ICD-10-CM | POA: Diagnosis not present

## 2017-02-11 DIAGNOSIS — N170 Acute kidney failure with tubular necrosis: Secondary | ICD-10-CM | POA: Diagnosis not present

## 2017-02-11 DIAGNOSIS — Z7982 Long term (current) use of aspirin: Secondary | ICD-10-CM | POA: Diagnosis not present

## 2017-02-11 DIAGNOSIS — Y838 Other surgical procedures as the cause of abnormal reaction of the patient, or of later complication, without mention of misadventure at the time of the procedure: Secondary | ICD-10-CM | POA: Diagnosis not present

## 2017-02-11 DIAGNOSIS — I10 Essential (primary) hypertension: Secondary | ICD-10-CM

## 2017-02-11 DIAGNOSIS — I214 Non-ST elevation (NSTEMI) myocardial infarction: Secondary | ICD-10-CM | POA: Diagnosis present

## 2017-02-11 DIAGNOSIS — J9383 Other pneumothorax: Secondary | ICD-10-CM | POA: Diagnosis not present

## 2017-02-11 DIAGNOSIS — K589 Irritable bowel syndrome without diarrhea: Secondary | ICD-10-CM | POA: Diagnosis present

## 2017-02-11 DIAGNOSIS — Z951 Presence of aortocoronary bypass graft: Secondary | ICD-10-CM | POA: Diagnosis not present

## 2017-02-11 DIAGNOSIS — R69 Illness, unspecified: Secondary | ICD-10-CM | POA: Diagnosis not present

## 2017-02-11 DIAGNOSIS — Z452 Encounter for adjustment and management of vascular access device: Secondary | ICD-10-CM | POA: Diagnosis not present

## 2017-02-11 DIAGNOSIS — Z87891 Personal history of nicotine dependence: Secondary | ICD-10-CM | POA: Diagnosis not present

## 2017-02-11 DIAGNOSIS — N189 Chronic kidney disease, unspecified: Secondary | ICD-10-CM | POA: Diagnosis present

## 2017-02-11 DIAGNOSIS — J9811 Atelectasis: Secondary | ICD-10-CM | POA: Diagnosis not present

## 2017-02-11 DIAGNOSIS — I9581 Postprocedural hypotension: Secondary | ICD-10-CM | POA: Diagnosis not present

## 2017-02-11 DIAGNOSIS — I9789 Other postprocedural complications and disorders of the circulatory system, not elsewhere classified: Secondary | ICD-10-CM | POA: Diagnosis not present

## 2017-02-11 DIAGNOSIS — I480 Paroxysmal atrial fibrillation: Secondary | ICD-10-CM

## 2017-02-11 DIAGNOSIS — N179 Acute kidney failure, unspecified: Secondary | ICD-10-CM | POA: Diagnosis not present

## 2017-02-11 DIAGNOSIS — I8390 Asymptomatic varicose veins of unspecified lower extremity: Secondary | ICD-10-CM | POA: Diagnosis present

## 2017-02-11 DIAGNOSIS — E876 Hypokalemia: Secondary | ICD-10-CM | POA: Diagnosis not present

## 2017-02-11 DIAGNOSIS — J939 Pneumothorax, unspecified: Secondary | ICD-10-CM

## 2017-02-11 DIAGNOSIS — I5042 Chronic combined systolic (congestive) and diastolic (congestive) heart failure: Secondary | ICD-10-CM | POA: Diagnosis present

## 2017-02-11 DIAGNOSIS — K210 Gastro-esophageal reflux disease with esophagitis: Secondary | ICD-10-CM | POA: Diagnosis present

## 2017-02-11 DIAGNOSIS — Z4682 Encounter for fitting and adjustment of non-vascular catheter: Secondary | ICD-10-CM | POA: Diagnosis not present

## 2017-02-11 DIAGNOSIS — K219 Gastro-esophageal reflux disease without esophagitis: Secondary | ICD-10-CM | POA: Diagnosis not present

## 2017-02-11 DIAGNOSIS — Z79899 Other long term (current) drug therapy: Secondary | ICD-10-CM | POA: Diagnosis not present

## 2017-02-11 DIAGNOSIS — I517 Cardiomegaly: Secondary | ICD-10-CM | POA: Diagnosis not present

## 2017-02-11 DIAGNOSIS — D509 Iron deficiency anemia, unspecified: Secondary | ICD-10-CM | POA: Diagnosis present

## 2017-02-11 DIAGNOSIS — J95811 Postprocedural pneumothorax: Secondary | ICD-10-CM | POA: Diagnosis not present

## 2017-02-11 DIAGNOSIS — I081 Rheumatic disorders of both mitral and tricuspid valves: Secondary | ICD-10-CM | POA: Diagnosis not present

## 2017-02-11 DIAGNOSIS — E1122 Type 2 diabetes mellitus with diabetic chronic kidney disease: Secondary | ICD-10-CM | POA: Diagnosis present

## 2017-02-11 DIAGNOSIS — R072 Precordial pain: Secondary | ICD-10-CM | POA: Diagnosis not present

## 2017-02-11 DIAGNOSIS — E1151 Type 2 diabetes mellitus with diabetic peripheral angiopathy without gangrene: Secondary | ICD-10-CM | POA: Diagnosis present

## 2017-02-11 DIAGNOSIS — F1010 Alcohol abuse, uncomplicated: Secondary | ICD-10-CM | POA: Diagnosis present

## 2017-02-11 DIAGNOSIS — Z9689 Presence of other specified functional implants: Secondary | ICD-10-CM

## 2017-02-11 DIAGNOSIS — I2510 Atherosclerotic heart disease of native coronary artery without angina pectoris: Secondary | ICD-10-CM

## 2017-02-11 DIAGNOSIS — Z09 Encounter for follow-up examination after completed treatment for conditions other than malignant neoplasm: Secondary | ICD-10-CM

## 2017-02-11 DIAGNOSIS — R079 Chest pain, unspecified: Secondary | ICD-10-CM | POA: Diagnosis present

## 2017-02-11 DIAGNOSIS — D62 Acute posthemorrhagic anemia: Secondary | ICD-10-CM | POA: Diagnosis not present

## 2017-02-11 DIAGNOSIS — Z7289 Other problems related to lifestyle: Secondary | ICD-10-CM | POA: Diagnosis present

## 2017-02-11 DIAGNOSIS — E782 Mixed hyperlipidemia: Secondary | ICD-10-CM | POA: Diagnosis not present

## 2017-02-11 DIAGNOSIS — I25110 Atherosclerotic heart disease of native coronary artery with unstable angina pectoris: Secondary | ICD-10-CM | POA: Diagnosis not present

## 2017-02-11 LAB — CBC
HEMATOCRIT: 35.1 % — AB (ref 39.0–52.0)
HEMOGLOBIN: 10.9 g/dL — AB (ref 13.0–17.0)
MCH: 22.9 pg — ABNORMAL LOW (ref 26.0–34.0)
MCHC: 31.1 g/dL (ref 30.0–36.0)
MCV: 73.9 fL — AB (ref 78.0–100.0)
Platelets: 259 10*3/uL (ref 150–400)
RBC: 4.75 MIL/uL (ref 4.22–5.81)
RDW: 17 % — AB (ref 11.5–15.5)
WBC: 4.9 10*3/uL (ref 4.0–10.5)

## 2017-02-11 LAB — LIPASE, BLOOD: LIPASE: 31 U/L (ref 11–51)

## 2017-02-11 LAB — BASIC METABOLIC PANEL
Anion gap: 10 (ref 5–15)
BUN: 14 mg/dL (ref 6–20)
CHLORIDE: 99 mmol/L — AB (ref 101–111)
CO2: 25 mmol/L (ref 22–32)
Calcium: 9.3 mg/dL (ref 8.9–10.3)
Creatinine, Ser: 1 mg/dL (ref 0.61–1.24)
GFR calc Af Amer: >60 mL/min (ref >60)
GFR calc non Af Amer: >60 mL/min (ref >60)
GLUCOSE: 123 mg/dL — AB (ref 65–99)
POTASSIUM: 3.9 mmol/L (ref 3.5–5.1)
Sodium: 134 mmol/L — ABNORMAL LOW (ref 135–145)

## 2017-02-11 LAB — I-STAT TROPONIN, ED: Troponin i, poc: 0.03 ng/mL (ref 0.00–0.08)

## 2017-02-11 MED ORDER — ASPIRIN 81 MG PO CHEW
324.0000 mg | CHEWABLE_TABLET | Freq: Once | ORAL | Status: AC
  Administered 2017-02-11: 324 mg via ORAL
  Filled 2017-02-11: qty 4

## 2017-02-11 MED ORDER — HYDROCHLOROTHIAZIDE 25 MG PO TABS
25.0000 mg | ORAL_TABLET | Freq: Every day | ORAL | Status: DC
  Administered 2017-02-12 – 2017-02-14 (×3): 25 mg via ORAL
  Filled 2017-02-11 (×3): qty 1

## 2017-02-11 MED ORDER — MORPHINE SULFATE (PF) 4 MG/ML IV SOLN
2.0000 mg | INTRAVENOUS | Status: DC | PRN
  Filled 2017-02-11: qty 1

## 2017-02-11 MED ORDER — NITROGLYCERIN 0.4 MG SL SUBL: 0.4000 mg | SUBLINGUAL_TABLET | SUBLINGUAL | Status: DC | PRN

## 2017-02-11 MED ORDER — ANIMAL SHAPES WITH C & FA PO CHEW
2.0000 | CHEWABLE_TABLET | Freq: Every day | ORAL | Status: DC
  Administered 2017-02-12 – 2017-02-14 (×3): 2 via ORAL
  Filled 2017-02-11 (×4): qty 2

## 2017-02-11 MED ORDER — ACETAMINOPHEN 325 MG PO TABS
650.0000 mg | ORAL_TABLET | Freq: Four times a day (QID) | ORAL | Status: DC | PRN
  Administered 2017-02-12 – 2017-02-13 (×2): 650 mg via ORAL
  Filled 2017-02-11 (×2): qty 2

## 2017-02-11 MED ORDER — ZOLPIDEM TARTRATE 5 MG PO TABS
5.0000 mg | ORAL_TABLET | Freq: Every evening | ORAL | Status: DC | PRN
  Administered 2017-02-12 – 2017-02-13 (×2): 5 mg via ORAL
  Filled 2017-02-11 (×2): qty 1

## 2017-02-11 MED ORDER — GUAIFENESIN ER 600 MG PO TB12: 1200.0000 mg | ORAL_TABLET | Freq: Two times a day (BID) | ORAL | Status: DC | PRN

## 2017-02-11 MED ORDER — PANTOPRAZOLE SODIUM 40 MG PO TBEC
40.0000 mg | DELAYED_RELEASE_TABLET | Freq: Two times a day (BID) | ORAL | Status: DC
  Administered 2017-02-12 – 2017-02-14 (×6): 40 mg via ORAL
  Filled 2017-02-11 (×6): qty 1

## 2017-02-11 MED ORDER — ADULT ONE DAILY GUMMIES PO CHEW: 2.0000 | CHEWABLE_TABLET | Freq: Every day | ORAL | Status: DC

## 2017-02-11 MED ORDER — TRAMADOL HCL 50 MG PO TABS: 50.0000 mg | ORAL_TABLET | Freq: Four times a day (QID) | ORAL | Status: DC | PRN

## 2017-02-11 MED ORDER — CARVEDILOL 3.125 MG PO TABS
3.1250 mg | ORAL_TABLET | Freq: Two times a day (BID) | ORAL | Status: DC
  Administered 2017-02-12: 3.125 mg via ORAL
  Filled 2017-02-11 (×2): qty 1

## 2017-02-11 MED ORDER — ONDANSETRON HCL 4 MG/2ML IJ SOLN: 4.0000 mg | Freq: Four times a day (QID) | INTRAMUSCULAR | Status: DC | PRN

## 2017-02-11 MED ORDER — PRAVASTATIN SODIUM 40 MG PO TABS: 40.0000 mg | ORAL_TABLET | Freq: Every day | ORAL | Status: DC

## 2017-02-11 MED ORDER — ENOXAPARIN SODIUM 40 MG/0.4ML ~~LOC~~ SOLN
40.0000 mg | SUBCUTANEOUS | Status: DC
  Administered 2017-02-11: 40 mg via SUBCUTANEOUS
  Filled 2017-02-11: qty 0.4

## 2017-02-11 MED ORDER — LISINOPRIL 20 MG PO TABS
20.0000 mg | ORAL_TABLET | Freq: Every day | ORAL | Status: DC
  Administered 2017-02-12 – 2017-02-14 (×3): 20 mg via ORAL
  Filled 2017-02-11 (×3): qty 1

## 2017-02-11 MED ORDER — SENNOSIDES-DOCUSATE SODIUM 8.6-50 MG PO TABS: 1.0000 | ORAL_TABLET | Freq: Every evening | ORAL | Status: DC | PRN

## 2017-02-11 MED ORDER — LISINOPRIL-HYDROCHLOROTHIAZIDE 20-25 MG PO TABS: 1.0000 | ORAL_TABLET | Freq: Every day | ORAL | Status: DC

## 2017-02-11 MED ORDER — FERROUS SULFATE 325 (65 FE) MG PO TABS
325.0000 mg | ORAL_TABLET | Freq: Every day | ORAL | Status: DC
  Administered 2017-02-12 – 2017-02-14 (×3): 325 mg via ORAL
  Filled 2017-02-11 (×3): qty 1

## 2017-02-11 MED ORDER — ALUM & MAG HYDROXIDE-SIMETH 200-200-20 MG/5ML PO SUSP: 30.0000 mL | Freq: Four times a day (QID) | ORAL | Status: DC | PRN

## 2017-02-11 MED ORDER — ASPIRIN 81 MG PO CHEW
81.0000 mg | CHEWABLE_TABLET | Freq: Every day | ORAL | Status: DC
  Administered 2017-02-12 – 2017-02-14 (×3): 81 mg via ORAL
  Filled 2017-02-11 (×3): qty 1

## 2017-02-11 NOTE — ED Provider Notes (Signed)
[facility] DEPT Provider Note   CSN: [id number] Arrival date & time: 02/11/17  1550     History   Chief Complaint Chief Complaint  Patient presents with  . Chest Pain    HPI Vincent Mcclure is a 83 y.o. male.  The history is provided by the patient.  Chest Pain   This is a new problem. The current episode started 3 to 5 hours ago. The problem occurs every several days. The problem has been gradually improving. The pain is associated with rest. The pain is present in the substernal region. The pain is at a severity of 6/10. The pain is moderate. The quality of the pain is described as pressure-like and heavy. The pain radiates to the left arm. Associated symptoms include nausea. Pertinent negatives include no abdominal pain, no back pain, no claudication, no cough, no diaphoresis, no dizziness, no exertional chest pressure, no fever, no headaches, no hemoptysis, no irregular heartbeat, no leg pain, no lower extremity edema, no malaise/fatigue, no near-syncope, no numbness, no orthopnea, no palpitations, no PND, no shortness of breath, no sputum production, no syncope, no vomiting and no weakness. He has tried nitroglycerin for the symptoms. The treatment provided mild relief. Risk factors include being elderly, male gender, smoking/tobacco exposure and alcohol intake.  His past medical history is significant for hyperlipidemia and hypertension.  Pertinent negatives for past medical history include no seizures.    Past Medical History:  Diagnosis Date  . Alcohol abuse   . Anemia   . Chronic low back pain 10/14/2011  . Colon polyps    hyperplastic (2004, 2010) and adenomatous (1990).    . COLONIC POLYPS, HX OF 03/05/2008  . Esophageal stricture    hx of  . Gastric ulcer   . GERD 03/05/2008  . GERD (gastroesophageal reflux disease) 1994   associated peptic strictures  . HYPERLIPIDEMIA 03/05/2008  . HYPERTENSION 03/05/2008  . Hypertension   . IBS 03/05/2008  . Impaired glucose  tolerance 10/12/2011  . Iron deficiency anemia   . LEG CRAMPS 09/02/2010  . PAD (peripheral artery disease) ([place]) 10/14/2011  . PAD (peripheral artery disease) ([place]) 2013  . PERIPHERAL EDEMA 09/02/2010  . PERIPHERAL NEUROPATHY 03/05/2008  . Prostatitis    hx of  . VARICOSE VEINS, LOWER EXTREMITIES 06/09/2009    Patient Active Problem List   Diagnosis Date Noted  . Chest pain 02/11/2017  . Overweight (BMI 25.0-29.9) 09/29/2016  . Alcohol use/ h/o heavy abuse 08/11/2016  . Left shoulder pain 01/24/2015  . GI bleed 11/14/2014  . h/o Anemia due to blood loss, acute 11/14/2014  . Eye pain   . Acute esophagitis   . Acute gastric ulcer   . Duodenal ulcer disease   . Upper GI bleed 11/13/2014  . Lumbar disc disease 01/30/2014  . Hearing loss 03/23/2013  . Screening for other and unspecified cardiovascular conditions 03/23/2013  . PAD (peripheral artery disease) ([place]) 10/14/2011  . Chronic low back pain 10/14/2011  . Glucose intolerance (impaired glucose tolerance) 10/12/2011  . Preventative health care 10/12/2011  . LEG CRAMPS 09/02/2010  . PERIPHERAL EDEMA 09/02/2010  . VARICOSE VEINS, LOWER EXTREMITIES 06/09/2009  . HLD (hyperlipidemia) 03/05/2008  . Anemia, iron deficiency 03/05/2008  . Hereditary and idiopathic peripheral neuropathy 03/05/2008  . HTN (hypertension) 03/05/2008  . GERD 03/05/2008  . IBS 03/05/2008  . Blood in stool 03/05/2008  . COLONIC POLYPS, HX OF 03/05/2008    Past Surgical History:  Procedure Laterality Date  . CATARACT EXTRACTION  bilat  . ESOPHAGOGASTRODUODENOSCOPY N/A 11/14/2014   Procedure: ESOPHAGOGASTRODUODENOSCOPY (EGD);  Surgeon: Jerene Bears, MD;  Location[facility] END[facility]COPY;  Service: Endoscopy;  Laterality: N/A;  . LUMBAR SPINE SURGERY  11/2008   Dr Joya Salm       Home Medications    Prior to Admission medications   Medication Sig Start Date End Date Taking? Authorizing Provider  acetaminophen (TYLENOL) 325 MG tablet Take 2 tablets (650 mg  total) by mouth every 6 (six) hours as needed for mild pain (or Fever >/= 101). Patient taking differently: Take 325-650 mg by mouth every 6 (six) hours as needed for mild pain (or Fever >/= 101).  11/15/14  Yes Buriev, Arie Sabina, MD  lisinopril-hydrochlorothiazide (PRINZIDE,ZESTORETIC) 20-25 MG tablet Take 1 tablet by mouth daily. 08/11/16  Yes Biagio Borg, MD  traMADol (ULTRAM) 50 MG tablet Take 1 tablet (50 mg total) by mouth every 6 (six) hours as needed for moderate pain. 08/11/16  Yes Biagio Borg, MD  Na Sulfate-K Sulfate-Mg Sulf (SUPREP BOWEL PREP KIT) 17.5-3.13-1.6 GM/180ML SOLN Use as directed Patient not taking: Reported on 09/29/2016 09/06/16   Levin Erp, PA  pantoprazole (PROTONIX) 40 MG tablet Take 1 tablet (40 mg total) by mouth 2 (two) times daily. Patient not taking: Reported on 02/11/2017 09/06/16   Levin Erp, PA  pravastatin (PRAVACHOL) 40 MG tablet Take 1 tablet (40 mg total) by mouth daily. Patient not taking: Reported on 02/11/2017 08/11/16   Biagio Borg, MD    Family History Family History  Problem Relation Age of Onset  . Cancer Mother     Brain Cancer  . Diabetes Father   . Heart disease Father     CAD  . Hypertension Father   . Stroke Father   . Diabetes Sister   . Stomach cancer Neg Hx   . Pancreatic cancer Neg Hx   . Colon cancer Neg Hx   . Esophageal cancer Neg Hx     Social History Social History  Substance Use Topics  . Smoking status: Former Smoker    Packs/day: 1.25    Years: 50.00    Types: Cigarettes    Quit date: 11/11/2008  . Smokeless tobacco: Former Systems developer    Types: Chew  . Alcohol use 3.6 oz/week    2 Glasses of wine, 4 Cans of beer per week     Allergies   Patient has no known allergies.   Review of Systems Review of Systems  Constitutional: Negative for chills, diaphoresis, fever and malaise/fatigue.  HENT: Negative for ear pain and sore throat.   Eyes: Negative for pain and visual disturbance.    Respiratory: Negative for cough, hemoptysis, sputum production and shortness of breath.   Cardiovascular: Positive for chest pain and leg swelling. Negative for palpitations, orthopnea, claudication, syncope, PND and near-syncope.  Gastrointestinal: Positive for nausea. Negative for abdominal pain, blood in stool and vomiting.  Genitourinary: Negative for dysuria and hematuria.  Musculoskeletal: Negative for arthralgias and back pain.  Skin: Negative for color change and rash.  Neurological: Negative for dizziness, seizures, syncope, weakness, numbness and headaches.  All other systems reviewed and are negative.    Physical Exam Updated Vital Signs BP (!) 163/81 (BP Location: Right Arm)   Pulse 91   Temp 98 F (36.7 C) (Oral)   Resp 16   Ht 5' 11"  (1.803 m)   Wt 95.3 kg   SpO2 98%   BMI 29.29 kg/m   Physical Exam  Constitutional:  He appears well-developed and well-nourished.  HENT:  Head: Normocephalic and atraumatic.  Mouth/Throat: No oropharyngeal exudate.  Eyes: Conjunctivae are normal. Pupils are equal, round, and reactive to light.  Neck: Normal range of motion. Neck supple.  Cardiovascular: Normal rate, regular rhythm and intact distal pulses.   No murmur heard. Pulmonary/Chest: Effort normal and breath sounds normal. No respiratory distress.  Abdominal: Soft. There is no tenderness.  Musculoskeletal: He exhibits edema (1+ pitting edema b/l).  Neurological: He is alert.  Skin: Skin is warm and dry.  Psychiatric: He has a normal mood and affect.  Nursing note and vitals reviewed.    ED Treatments / Results  Labs (all labs ordered are listed, but only abnormal results are displayed) Labs Reviewed  BASIC METABOLIC PANEL - Abnormal; Notable for the following:       Result Value   Sodium 134 (*)    Chloride 99 (*)    Glucose, Bld 123 (*)    All other components within normal limits  CBC - Abnormal; Notable for the following:    Hemoglobin 10.9 (*)    HCT 35.1  (*)    MCV 73.9 (*)    MCH 22.9 (*)    RDW 17.0 (*)    All other components within normal limits  TROPONIN I - Abnormal; Notable for the following:    Troponin I 1.04 (*)    All other components within normal limits  LIPASE, BLOOD  HEMOGLOBIN A1C  LIPID PANEL  TROPONIN I  TROPONIN I  I-STAT TROPOININ, ED    EKG  EKG Interpretation None       Radiology Dg Chest 2 View  Result Date: 02/11/2017 CLINICAL DATA:  83 year old presenting with acute onset of chest pain radiating into both arms and into the neck that began approximately 4 hours ago. Current history of hypertension. EXAM: CHEST  2 VIEW COMPARISON:  09/01/2016, 11/13/2014 and earlier. FINDINGS: Cardiac silhouette normal in size, unchanged. Thoracic aorta tortuous and atherosclerotic, unchanged. Linear scarring in the lower lobes and lingula, unchanged. Nodules in the upper lobes, dense for their small size, unchanged dating back to 2016 and therefore indicating calcified granulomata. No new nodules in either lung. Pulmonary vascularity normal. Bronchovascular markings. No pleural effusions. No pneumothorax. Degenerative changes involving the thoracic and upper lumbar spine. IMPRESSION: No acute cardiopulmonary disease. Stable scarring in the lower lobes and lingula. Stable small calcified granulomata in the upper lobes. Electronically Signed   By: Evangeline Dakin M.D.   On: 02/11/2017 16:56    Procedures Procedures (including critical care time)  Medications Ordered in ED Medications  aspirin chewable tablet 81 mg (not administered)  pantoprazole (PROTONIX) EC tablet 40 mg (40 mg Oral Not Given 02/11/17 2000)  ferrous sulfate tablet 325 mg (not administered)  guaiFENesin (MUCINEX) 12 hr tablet 1,200 mg (not administered)  pravastatin (PRAVACHOL) tablet 40 mg (not administered)  traMADol (ULTRAM) tablet 50 mg (not administered)  acetaminophen (TYLENOL) tablet 650 mg (not administered)  nitroGLYCERIN (NITROSTAT) SL tablet  0.4 mg (not administered)  morphine 4 MG/ML injection 2 mg (not administered)  carvedilol (COREG) tablet 3.125 mg (3.125 mg Oral Not Given 02/12/17 0023)  ondansetron (ZOFRAN) injection 4 mg (not administered)  enoxaparin (LOVENOX) injection 40 mg (40 mg Subcutaneous Given 02/11/17 2132)  zolpidem (AMBIEN) tablet 5 mg (not administered)  alum & mag hydroxide-simeth (MAALOX/MYLANTA) 200-200-20 MG/5ML suspension 30 mL (not administered)  senna-docusate (Senokot-S) tablet 1 tablet (not administered)  lisinopril (PRINIVIL,ZESTRIL) tablet 20 mg (not administered)  hydrochlorothiazide (  HYDRODIURIL) tablet 25 mg (not administered)  multivitamin animal shapes (with Ca/FA) chewable tablet 2 tablet (not administered)  aspirin chewable tablet 324 mg (324 mg Oral Given 02/11/17 1915)     Initial Impression / Assessment and Plan / ED Course  I have reviewed the triage vital signs and the nursing notes.  Pertinent labs & imaging results that were available during my care of the patient were reviewed by me and considered in my medical decision making (see chart for details).     Vincent Mcclure is an 83 year old male with history of hypertension, high cholesterol who presents to the ED with chest pain.  Patient's vitals at time of arrival to ED are significant for hypertension but otherwise are unremarkable and patient is without fever.  Patient with chest pain for the last several hours that has improved with nitro.  Patient states that he has had intermittent chest pain at rest over the last several weeks.  Patient had last cardiac catheterization in the 1980s and denies any stress test or echo since.  Patient denies any DVT or PE risk factors. Wells 0 and doubt PE.  Patient is a former smoker.  On examination, patient with 1+ pitting edema bilaterally otherwise exam is unremarkable.  Patient with multiple cardiac risk factors and is high risk for ACS.  Will evaluate with CBC, BMP, serial troponins, chest x-ray,  EKG.  EKG shows normal sinus rhythm with ST depressions in the lateral leads which are seen on prior EKGs.  No acute signs of ACS or ischemia on EKG otherwise.  Patient with no infectious symptoms and chest x-ray with no pneumonia, pneumothorax, no widened mediastinum.  History and physical not consistent with dissection.  Patient with hemoglobin near baseline, doubt anemia.  Patient otherwise with unremarkable labs.  Initial troponin within normal limits.  Patient with multiple cardiac risk factors and no signs to suggest other cause of chest pain including pneumonia, PE.  Patient to be admitted to hospitalist service for further ACS rule out.  Patient was given 324 of aspirin and was transferred to the floor in stable condition.  I discussed this patient w/ Dr. Reather Converse who supervised my care of this patient  Final Clinical Impressions(s) / ED Diagnoses   Final diagnoses:  Chest pain, unspecified type    New Prescriptions Current Discharge Medication List       Lennice Sites, DO 02/12/17 Rebekah [place]field, MD 02/12/17 [phone]

## 2017-02-11 NOTE — H&P (Addendum)
History and Physical    Vincent Mcclure LKT:[id number] DOB: 09-Apr-1932 DOA: 02/11/2017  Referring MD/NP/PA:   PCP: [name], DO   Patient coming from:  The patient is coming from home.  At baseline, pt is independent for most of ADL.   Chief Complaint: Chest pain  HPI: Vincent Mcclure is a 83 y.o. male with medical history significant of hypertension, hyperlipidemia, GERD, varicose vein in lower extremities, PVD, IBS, gastric ulcer, GI bleeding, alcohol abuse, iron deficiency anemia, who presents with chest pain.  Patient states that he has been having intermittent mild chest pain for about 1 month. Today he started having another episode of chest pain and 1:15 PM. It is located in the substernal area, constant, dull, radiating to the left arm and neck. It is not associated with shortness of breath. Patient has mild intermittent dry cough, which he attributes to lisinopril use. He states that the cough is controlled by taking Mucinex. He wants to continue to take lisinopril. Patient denies tenderness in the calf areas, no recent long distant traveling. Patient states that his chest pain was initially 8 out of 10 in severity, subsided to 2 out of 10 in severity now. Patient was treated with aspirin and nitroglycerin in ED. Patient states that he continues to drink alcohol, 2 or 3 beers each day. He has nausea and dry heaves and mild vomitting intermittently in the past 2 years, which has not changed recently. No abdominal pain, diarrhea. No symptoms of UTI. No fever or chills. No unilateral weakness.  ED Course: pt was found to have negative troponin, WBC 4.9, electrolytes renal function okay, temperature normal, oxygen saturation 98% on room air, x-ray is negative for infiltration. Patient is placed on telemetry bed for observation.  Review of Systems:   General: no fevers, chills, no changes in body weight, has fatigue HEENT: no blurry vision, hearing changes or sore throat Respiratory: n  odyspnea, has coughing, no wheezing CV: has chest pain, no palpitations GI: has nausea, vomiting, no abdominal pain, diarrhea, constipation GU: no dysuria, burning on urination, increased urinary frequency, hematuria  Ext: has leg edema Neuro: no unilateral weakness, numbness, or tingling, no vision change or hearing loss Skin: no rash, no skin tear. MSK: No muscle spasm, no deformity, no limitation of range of movement in spin Heme: No easy bruising.  Travel history: No recent long distant travel.  Allergy: No Known Allergies  Past Medical History:  Diagnosis Date  . Alcohol abuse   . Anemia   . Chronic low back pain 10/14/2011  . Colon polyps    hyperplastic (2004, 2010) and adenomatous (1990).    . COLONIC POLYPS, HX OF 03/05/2008  . Esophageal stricture    hx of  . Gastric ulcer   . GERD 03/05/2008  . GERD (gastroesophageal reflux disease) 1994   associated peptic strictures  . HYPERLIPIDEMIA 03/05/2008  . HYPERTENSION 03/05/2008  . Hypertension   . IBS 03/05/2008  . Impaired glucose tolerance 10/12/2011  . Iron deficiency anemia   . LEG CRAMPS 09/02/2010  . PAD (peripheral artery disease) ([place]) 10/14/2011  . PAD (peripheral artery disease) ([place]) 2013  . PERIPHERAL EDEMA 09/02/2010  . PERIPHERAL NEUROPATHY 03/05/2008  . Prostatitis    hx of  . VARICOSE VEINS, LOWER EXTREMITIES 06/09/2009    Past Surgical History:  Procedure Laterality Date  . CATARACT EXTRACTION  bilat  . ESOPHAGOGASTRODUODENOSCOPY N/A 11/14/2014   Procedure: ESOPHAGOGASTRODUODENOSCOPY (EGD);  Surgeon: Jerene Bears, MD;  Location: [facility] ENDOSCOPY;  Service: Endoscopy;  Laterality: N/A;  . LUMBAR SPINE SURGERY  11/2008   Dr Joya Salm    Social History:  reports that he quit smoking about 8 years ago. His smoking use included Cigarettes. He has a 62.50 pack-year smoking history. He has quit using smokeless tobacco. His smokeless tobacco use included Chew. He reports that he drinks about 3.6 oz of alcohol per week . He  reports that he does not use drugs.  Family History:  Family History  Problem Relation Age of Onset  . Cancer Mother     Brain Cancer  . Diabetes Father   . Heart disease Father     CAD  . Hypertension Father   . Stroke Father   . Diabetes Sister   . Stomach cancer Neg Hx   . Pancreatic cancer Neg Hx   . Colon cancer Neg Hx   . Esophageal cancer Neg Hx      Prior to Admission medications   Medication Sig Start Date End Date Taking? Authorizing Provider  acetaminophen (TYLENOL) 325 MG tablet Take 2 tablets (650 mg total) by mouth every 6 (six) hours as needed for mild pain (or Fever >/= 101). 11/15/14   Kinnie Feil, MD  ferrous sulfate 325 (65 FE) MG tablet Take 325 mg by mouth daily with breakfast.    Historical Provider, MD  guaiFENesin (MUCINEX) 600 MG 12 hr tablet Take 1,200 mg by mouth 2 (two) times daily as needed for cough or to loosen phlegm.    Historical Provider, MD  lisinopril-hydrochlorothiazide (PRINZIDE,ZESTORETIC) 20-25 MG tablet Take 1 tablet by mouth daily. 08/11/16   Biagio Borg, MD  Multiple Vitamins-Minerals (ADULT ONE DAILY GUMMIES) [place] 2 each by mouth daily.    Historical Provider, MD  Na Sulfate-K Sulfate-Mg Sulf (SUPREP BOWEL PREP KIT) 17.5-3.13-1.6 GM/180ML SOLN Use as directed Patient not taking: Reported on 09/29/2016 09/06/16   Levin Erp, PA  pantoprazole (PROTONIX) 40 MG tablet Take 1 tablet (40 mg total) by mouth 2 (two) times daily. 09/06/16   Levin Erp, PA  pravastatin (PRAVACHOL) 40 MG tablet Take 1 tablet (40 mg total) by mouth daily. 08/11/16   Biagio Borg, MD  traMADol (ULTRAM) 50 MG tablet Take 1 tablet (50 mg total) by mouth every 6 (six) hours as needed for moderate pain. 08/11/16   Biagio Borg, MD    Physical Exam: Vitals:   02/11/17 2008 02/11/17 2030 02/12/17 0026 02/12/17 0431  BP:  (!) 165/102 (!) 163/81 (!) 141/80  Pulse:  99 91 90  Resp:  16  20  Temp: 97.8 F (36.6 C) 98.4 F (36.9 C) 98 F (36.7  C) 98.7 F (37.1 C)  TempSrc: Oral Oral Oral Oral  SpO2:  98%  98%  Weight:  95.3 kg (210 lb)  94.2 kg (207 lb 11.2 oz)  Height:  _0  (1.803 m)     General: Not in acute distress HEENT:       Eyes: PERRL, EOMI, no scleral icterus.       ENT: No discharge from the ears and nose, no pharynx injection, no tonsillar enlargement.        Neck: No JVD, no bruit, no mass felt. Heme: No neck lymph node enlargement. Cardiac: S1/S2, RRR, No murmurs, No gallops or rubs. Respiratory: No rales, wheezing, rhonchi or rubs. GI: Soft, nondistended, nontender, no rebound pain, no organomegaly, BS present. GU: No hematuria Ext: has trace leg edema bilaterally. 2+DP/PT pulse bilaterally. Musculoskeletal: No  joint deformities, No joint redness or warmth, no limitation of ROM in spin. Skin: No rashes.  Neuro: Alert, oriented X3, cranial nerves II-XII grossly intact, moves all extremities normally.  Psych: Patient is not psychotic, no suicidal or hemocidal ideation.  Labs on Admission: I have personally reviewed following labs and imaging studies  CBC:  Recent Labs Lab 02/11/17 1607  WBC 4.9  HGB 10.9*  HCT 35.1*  MCV 73.9*  PLT 062   Basic Metabolic Panel:  Recent Labs Lab 02/11/17 1607  NA 134*  K 3.9  CL 99*  CO2 25  GLUCOSE 123*  BUN 14  CREATININE 1.00  CALCIUM 9.3   GFR: Estimated Creatinine Clearance: 65.6 mL/min (by C-G formula based on SCr of 1 mg/dL). Liver Function Tests: No results for input(s): AST, ALT, ALKPHOS, BILITOT, PROT, ALBUMIN in the last 168 hours.  Recent Labs Lab 02/11/17 1930  LIPASE 31   No results for input(s): AMMONIA in the last 168 hours. Coagulation Profile: No results for input(s): INR, PROTIME in the last 168 hours. Cardiac Enzymes:  Recent Labs Lab 02/12/17 0008  TROPONINI 1.04*   BNP (last 3 results) No results for input(s): PROBNP in the last 8760 hours. HbA1C: No results for input(s): HGBA1C in the last 72 hours. CBG: No  results for input(s): GLUCAP in the last 168 hours. Lipid Profile: No results for input(s): CHOL, HDL, LDLCALC, TRIG, CHOLHDL, LDLDIRECT in the last 72 hours. Thyroid Function Tests: No results for input(s): TSH, T4TOTAL, FREET4, T3FREE, THYROIDAB in the last 72 hours. Anemia Panel: No results for input(s): VITAMINB12, FOLATE, FERRITIN, TIBC, IRON, RETICCTPCT in the last 72 hours. Urine analysis:    Component Value Date/Time   COLORURINE YELLOW 09/01/2016 [place] 09/01/2016 1727   LABSPEC 1.007 09/01/2016 1727   PHURINE 7.0 09/01/2016 1727   GLUCOSEU NEGATIVE 09/01/2016 1727   GLUCOSEU NEGATIVE 08/11/2016 1521   HGBUR NEGATIVE 09/01/2016 1727   BILIRUBINUR NEGATIVE 09/01/2016 1727   KETONESUR NEGATIVE 09/01/2016 1727   PROTEINUR NEGATIVE 09/01/2016 1727   UROBILINOGEN 1.0 08/11/2016 1521   NITRITE NEGATIVE 09/01/2016 1727   LEUKOCYTESUR NEGATIVE 09/01/2016 1727   Sepsis Labs: _0 (procalcitonin:4,lacticidven:4) )No results found for this or any previous visit (from the past 240 hour(s)).   Radiological Exams on Admission: Dg Chest 2 View  Result Date: 02/11/2017 CLINICAL DATA:  83 year old presenting with acute onset of chest pain radiating into both arms and into the neck that began approximately 4 hours ago. Current history of hypertension. EXAM: CHEST  2 VIEW COMPARISON:  09/01/2016, 11/13/2014 and earlier. FINDINGS: Cardiac silhouette normal in size, unchanged. Thoracic aorta tortuous and atherosclerotic, unchanged. Linear scarring in the lower lobes and lingula, unchanged. Nodules in the upper lobes, dense for their small size, unchanged dating back to 2016 and therefore indicating calcified granulomata. No new nodules in either lung. Pulmonary vascularity normal. Bronchovascular markings. No pleural effusions. No pneumothorax. Degenerative changes involving the thoracic and upper lumbar spine. IMPRESSION: No acute cardiopulmonary disease. Stable scarring in  the lower lobes and lingula. Stable small calcified granulomata in the upper lobes. Electronically Signed   By: Evangeline Dakin M.D.   On: 02/11/2017 16:56     EKG: Independently reviewed.  Sinus rhythm, QTC 450, anteroseptal infarction pattern, low voltage, ST depression in inferior leads and V4-V6.   Assessment/Plan Principal Problem:   NSTEMI (non-ST elevated myocardial infarction) ([place]) Active Problems:   HLD (hyperlipidemia)   Anemia, iron deficiency   HTN (hypertension)   VARICOSE VEINS,  LOWER EXTREMITIES   GERD   Alcohol use/ h/o heavy abuse   Chest pain  Chest pain: Patient's is likely due to angina. No pneumonia on chest x-ray. Initial troponin negative. Patient does not have signs of DVT, less likely to have PE.  - will place on Tele bed for obs - cycle CE q6 x3 and repeat EKG in the am  - Nitroglycerin, Morphine, and pravastatin - pt received 324 mg of ASA in ED. Pt has hx of alcohol abuse and upper GI bleeding due to peptic ulcer disease, will decrease dose to 81 mg and continue protonix 40 bid - will add coreg 3.125 mg bid - Risk factor stratification: will check FLP and A1C  - 2d echo  Addendum: the repeat trop is 1.04--> NSTEMI. -IV heparin was started by on-call PA after calling card -will switch from pravastatin to high-dose Lipitor, 80 mg daily -please consult to card in AM.  GERD: -Protonix -add Mylanta prn  HTN: pt has mild intermittent dry cough, may be related to lisinopril. He states that the cough is controlled by taking Mucinex. He wants to continue to take lisinopril.  - continue prinzide  HLD: Last LDL was 68 on 08/11/16 -Continue home medications: Pravastatin -Check FLP  Anemia, iron deficiency: Hgb 10.9 -continue iron supplement  -Add prn senokot  Alcohol use/ h/o heavy abuse: -Did counseling about the importance of quitting drinking -CIWA protocol  Intermittent nausea, dry heaves, occasionally vomiting: Most likely due to alcohol abuse   --Did counseling about the importance of quitting drinking -When necessary Zofran for nausea -On Protonix -Check lipase    DVT ppx: SQ Lovenox Code Status: Full code Family Communication: Yes, patient's wife at bed side Disposition Plan:  Anticipate discharge back to previous home environment Consults called:  none Admission status: Obs / tele   Date of Service 02/12/2017    Ivor Costa [facility] Pager [phone]  If 7PM-7AM, please contact night-coverage [email or web address] Password [email or web address] 02/12/2017, 6:59 AM

## 2017-02-11 NOTE — ED Triage Notes (Signed)
To ED for eval of cp with radiation to arms and neck. States pain is dull, constant. Did have episode of vomiting. Took 2 nitro pta with no relief.

## 2017-02-12 DIAGNOSIS — N170 Acute kidney failure with tubular necrosis: Secondary | ICD-10-CM | POA: Diagnosis not present

## 2017-02-12 DIAGNOSIS — I214 Non-ST elevation (NSTEMI) myocardial infarction: Secondary | ICD-10-CM | POA: Diagnosis present

## 2017-02-12 DIAGNOSIS — I130 Hypertensive heart and chronic kidney disease with heart failure and stage 1 through stage 4 chronic kidney disease, or unspecified chronic kidney disease: Secondary | ICD-10-CM | POA: Diagnosis present

## 2017-02-12 DIAGNOSIS — E876 Hypokalemia: Secondary | ICD-10-CM | POA: Diagnosis not present

## 2017-02-12 DIAGNOSIS — Z87891 Personal history of nicotine dependence: Secondary | ICD-10-CM | POA: Diagnosis not present

## 2017-02-12 DIAGNOSIS — R072 Precordial pain: Secondary | ICD-10-CM | POA: Diagnosis not present

## 2017-02-12 DIAGNOSIS — J9383 Other pneumothorax: Secondary | ICD-10-CM | POA: Diagnosis not present

## 2017-02-12 DIAGNOSIS — N189 Chronic kidney disease, unspecified: Secondary | ICD-10-CM | POA: Diagnosis present

## 2017-02-12 DIAGNOSIS — F1010 Alcohol abuse, uncomplicated: Secondary | ICD-10-CM | POA: Diagnosis present

## 2017-02-12 DIAGNOSIS — Z01810 Encounter for preprocedural cardiovascular examination: Secondary | ICD-10-CM | POA: Diagnosis not present

## 2017-02-12 DIAGNOSIS — Z79899 Other long term (current) drug therapy: Secondary | ICD-10-CM | POA: Diagnosis not present

## 2017-02-12 DIAGNOSIS — D62 Acute posthemorrhagic anemia: Secondary | ICD-10-CM | POA: Diagnosis not present

## 2017-02-12 DIAGNOSIS — Z951 Presence of aortocoronary bypass graft: Secondary | ICD-10-CM | POA: Diagnosis not present

## 2017-02-12 DIAGNOSIS — D509 Iron deficiency anemia, unspecified: Secondary | ICD-10-CM | POA: Diagnosis present

## 2017-02-12 DIAGNOSIS — E782 Mixed hyperlipidemia: Secondary | ICD-10-CM | POA: Diagnosis not present

## 2017-02-12 DIAGNOSIS — K210 Gastro-esophageal reflux disease with esophagitis: Secondary | ICD-10-CM | POA: Diagnosis present

## 2017-02-12 DIAGNOSIS — I480 Paroxysmal atrial fibrillation: Secondary | ICD-10-CM | POA: Diagnosis not present

## 2017-02-12 DIAGNOSIS — Z789 Other specified health status: Secondary | ICD-10-CM | POA: Diagnosis not present

## 2017-02-12 DIAGNOSIS — Y838 Other surgical procedures as the cause of abnormal reaction of the patient, or of later complication, without mention of misadventure at the time of the procedure: Secondary | ICD-10-CM | POA: Diagnosis not present

## 2017-02-12 DIAGNOSIS — I9789 Other postprocedural complications and disorders of the circulatory system, not elsewhere classified: Secondary | ICD-10-CM | POA: Diagnosis not present

## 2017-02-12 DIAGNOSIS — I5042 Chronic combined systolic (congestive) and diastolic (congestive) heart failure: Secondary | ICD-10-CM | POA: Diagnosis present

## 2017-02-12 DIAGNOSIS — J939 Pneumothorax, unspecified: Secondary | ICD-10-CM | POA: Diagnosis not present

## 2017-02-12 DIAGNOSIS — K219 Gastro-esophageal reflux disease without esophagitis: Secondary | ICD-10-CM | POA: Diagnosis not present

## 2017-02-12 DIAGNOSIS — I2510 Atherosclerotic heart disease of native coronary artery without angina pectoris: Secondary | ICD-10-CM | POA: Diagnosis present

## 2017-02-12 DIAGNOSIS — E1151 Type 2 diabetes mellitus with diabetic peripheral angiopathy without gangrene: Secondary | ICD-10-CM | POA: Diagnosis present

## 2017-02-12 DIAGNOSIS — I9581 Postprocedural hypotension: Secondary | ICD-10-CM | POA: Diagnosis not present

## 2017-02-12 DIAGNOSIS — Z8711 Personal history of peptic ulcer disease: Secondary | ICD-10-CM | POA: Diagnosis not present

## 2017-02-12 DIAGNOSIS — K589 Irritable bowel syndrome without diarrhea: Secondary | ICD-10-CM | POA: Diagnosis present

## 2017-02-12 DIAGNOSIS — I25110 Atherosclerotic heart disease of native coronary artery with unstable angina pectoris: Secondary | ICD-10-CM | POA: Diagnosis not present

## 2017-02-12 DIAGNOSIS — E1122 Type 2 diabetes mellitus with diabetic chronic kidney disease: Secondary | ICD-10-CM | POA: Diagnosis present

## 2017-02-12 DIAGNOSIS — Z7982 Long term (current) use of aspirin: Secondary | ICD-10-CM | POA: Diagnosis not present

## 2017-02-12 DIAGNOSIS — E785 Hyperlipidemia, unspecified: Secondary | ICD-10-CM | POA: Diagnosis present

## 2017-02-12 LAB — LIPID PANEL
Cholesterol: 145 mg/dL (ref 0–200)
HDL: 43 mg/dL (ref >40)
LDL CALC: 85 mg/dL (ref 0–99)
Total CHOL/HDL Ratio: 3.4 RATIO
Triglycerides: 83 mg/dL (ref <150)
VLDL: 17 mg/dL (ref 0–40)

## 2017-02-12 LAB — TROPONIN I
TROPONIN I: 1.04 ng/mL — AB (ref <0.03)
TROPONIN I: 2.21 ng/mL — AB (ref <0.03)
Troponin I: 2.24 ng/mL (ref <0.03)

## 2017-02-12 LAB — GLUCOSE, CAPILLARY
GLUCOSE-CAPILLARY: 130 mg/dL — AB (ref 65–99)
GLUCOSE-CAPILLARY: 111 mg/dL — AB (ref 65–99)
Glucose-Capillary: 108 mg/dL — ABNORMAL HIGH (ref 65–99)
Glucose-Capillary: 120 mg/dL — ABNORMAL HIGH (ref 65–99)

## 2017-02-12 LAB — HEMOGLOBIN AND HEMATOCRIT, BLOOD
HCT: 34.8 % — ABNORMAL LOW (ref 39.0–52.0)
HEMATOCRIT: 30.9 % — AB (ref 39.0–52.0)
HEMOGLOBIN: 10.5 g/dL — AB (ref 13.0–17.0)
HEMOGLOBIN: 9.8 g/dL — AB (ref 13.0–17.0)

## 2017-02-12 LAB — HEPARIN LEVEL (UNFRACTIONATED)
HEPARIN UNFRACTIONATED: 0.39 [IU]/mL (ref 0.30–0.70)
HEPARIN UNFRACTIONATED: 0.38 [IU]/mL (ref 0.30–0.70)

## 2017-02-12 MED ORDER — LORAZEPAM 1 MG PO TABS: 1.0000 mg | ORAL_TABLET | Freq: Four times a day (QID) | ORAL | Status: DC | PRN

## 2017-02-12 MED ORDER — ATORVASTATIN CALCIUM 80 MG PO TABS: 80.0000 mg | ORAL_TABLET | Freq: Every day | ORAL | Status: DC

## 2017-02-12 MED ORDER — LORAZEPAM 2 MG/ML IJ SOLN: 1.0000 mg | Freq: Four times a day (QID) | INTRAMUSCULAR | Status: DC | PRN

## 2017-02-12 MED ORDER — THIAMINE HCL 100 MG/ML IJ SOLN
100.0000 mg | Freq: Every day | INTRAMUSCULAR | Status: DC
  Filled 2017-02-12 (×2): qty 2

## 2017-02-12 MED ORDER — ATORVASTATIN CALCIUM 80 MG PO TABS
80.0000 mg | ORAL_TABLET | Freq: Every day | ORAL | Status: DC
  Administered 2017-02-12 – 2017-02-14 (×3): 80 mg via ORAL
  Filled 2017-02-12 (×3): qty 1

## 2017-02-12 MED ORDER — CARVEDILOL 6.25 MG PO TABS
6.2500 mg | ORAL_TABLET | Freq: Two times a day (BID) | ORAL | Status: DC
  Administered 2017-02-12 – 2017-02-14 (×5): 6.25 mg via ORAL
  Filled 2017-02-12 (×5): qty 1

## 2017-02-12 MED ORDER — HEPARIN BOLUS VIA INFUSION
4000.0000 [IU] | Freq: Once | INTRAVENOUS | Status: AC
  Administered 2017-02-12: 4000 [IU] via INTRAVENOUS
  Filled 2017-02-12: qty 4000

## 2017-02-12 MED ORDER — HEPARIN (PORCINE) IN NACL 100-0.45 UNIT/ML-% IJ SOLN
1100.0000 [IU]/h | INTRAMUSCULAR | Status: DC
  Administered 2017-02-12 – 2017-02-13 (×3): 1300 [IU]/h via INTRAVENOUS
  Filled 2017-02-12 (×3): qty 250

## 2017-02-12 MED ORDER — VITAMIN B-1 100 MG PO TABS
100.0000 mg | ORAL_TABLET | Freq: Every day | ORAL | Status: DC
  Administered 2017-02-12 – 2017-02-14 (×3): 100 mg via ORAL
  Filled 2017-02-12 (×3): qty 1

## 2017-02-12 MED ORDER — FOLIC ACID 1 MG PO TABS
1.0000 mg | ORAL_TABLET | Freq: Every day | ORAL | Status: DC
  Administered 2017-02-12 – 2017-02-14 (×3): 1 mg via ORAL
  Filled 2017-02-12 (×3): qty 1

## 2017-02-12 NOTE — Progress Notes (Signed)
ANTICOAGULATION CONSULT NOTE - Initial Consult  Pharmacy Consult for Heparin Indication: chest pain/ACS  No Known Allergies  Patient Measurements: Height: 5' 11"  (180.3 cm) Weight: 210 lb (95.3 kg) IBW/kg (Calculated) : 75.3 Heparin Dosing Weight: 95 kg  Vital Signs: Temp: 98 F (36.7 C) (05/05 0026) Temp Source: Oral (05/05 0026) BP: 163/81 (05/05 0026) Pulse Rate: 91 (05/05 0026)  Labs:  Recent Labs  02/11/17 1607 02/12/17 0008  HGB 10.9*  --   HCT 35.1*  --   PLT 259  --   CREATININE 1.00  --   TROPONINI  --  1.04*    Estimated Creatinine Clearance: 65.9 mL/min (by C-G formula based on SCr of 1 mg/dL).   Medical History: Past Medical History:  Diagnosis Date  . Alcohol abuse   . Anemia   . Chronic low back pain 10/14/2011  . Colon polyps    hyperplastic (2004, 2010) and adenomatous (1990).    . COLONIC POLYPS, HX OF 03/05/2008  . Esophageal stricture    hx of  . Gastric ulcer   . GERD 03/05/2008  . GERD (gastroesophageal reflux disease) 1994   associated peptic strictures  . HYPERLIPIDEMIA 03/05/2008  . HYPERTENSION 03/05/2008  . Hypertension   . IBS 03/05/2008  . Impaired glucose tolerance 10/12/2011  . Iron deficiency anemia   . LEG CRAMPS 09/02/2010  . PAD (peripheral artery disease) ([place]) 10/14/2011  . PAD (peripheral artery disease) ([place]) 2013  . PERIPHERAL EDEMA 09/02/2010  . PERIPHERAL NEUROPATHY 03/05/2008  . Prostatitis    hx of  . VARICOSE VEINS, LOWER EXTREMITIES 06/09/2009    Medications:  Prescriptions Prior to Admission  Medication Sig Dispense Refill Last Dose  . acetaminophen (TYLENOL) 325 MG tablet Take 2 tablets (650 mg total) by mouth every 6 (six) hours as needed for mild pain (or Fever >/= 101). (Patient taking differently: Take 325-650 mg by mouth every 6 (six) hours as needed for mild pain (or Fever >/= 101). )   few days ago  . lisinopril-hydrochlorothiazide (PRINZIDE,ZESTORETIC) 20-25 MG tablet Take 1 tablet by mouth daily. 90  tablet 3 02/11/2017 at am  . traMADol (ULTRAM) 50 MG tablet Take 1 tablet (50 mg total) by mouth every 6 (six) hours as needed for moderate pain. 60 tablet 1 couple days ago  . Na Sulfate-K Sulfate-Mg Sulf (SUPREP BOWEL PREP KIT) 17.5-3.13-1.6 GM/180ML SOLN Use as directed (Patient not taking: Reported on 09/29/2016) 2 Bottle 0 Not Taking at Unknown time  . pantoprazole (PROTONIX) 40 MG tablet Take 1 tablet (40 mg total) by mouth 2 (two) times daily. (Patient not taking: Reported on 02/11/2017) 60 tablet 2 Not Taking at Unknown time  . pravastatin (PRAVACHOL) 40 MG tablet Take 1 tablet (40 mg total) by mouth daily. (Patient not taking: Reported on 02/11/2017) 90 tablet 3 Not Taking at Unknown time    Assessment: 83 y.o. M presents with CP. Pt with elevated trop to 1.04. To begin heparin for r/o ACS. CBC ok on admission.  Lovenox 53m given at 2132  Goal of Therapy:  Heparin level 0.3-0.7 units/ml Monitor platelets by anticoagulation protocol: Yes   Plan:  D/c Lovenox Heparin IV bolus 4000 units Heparin gtt at 1300 units/hr Will f/u heparin level in 8 hours Daily heparin level and CBC   Azalya Galyon, CJuanda Chance5/02/2017,3:07 AM

## 2017-02-12 NOTE — Progress Notes (Signed)
PROGRESS NOTE    Vincent Mcclure  EXB:[id number] DOB: [birth year] DOA: 02/11/2017 PCP: [name], DO   Brief Narrative: Vincent Mcclure is a 83 y.o. male with medical history significant of hypertension, hyperlipidemia, GERD, varicose vein in lower extremities, PVD, IBS, gastric ulcer, GI bleeding, alcohol abuse, iron deficiency anemia, who presents with chest pain.  Assessment & Plan:   Principal Problem:   NSTEMI (non-ST elevated myocardial infarction) ([place]) Active Problems:   HLD (hyperlipidemia)   Anemia, iron deficiency   HTN (hypertension)   VARICOSE VEINS, LOWER EXTREMITIES   GERD   Alcohol use/ h/o heavy abuse   Chest pain   NSTEMI:  Admitted for further evaluation.  On IV heparin, cardiology consulted and plan for cath in the near future.  Currently chest pain free.  Cardiac enzymes trending up.  Resume aspirin, BB, ace inhibitor, and lipitor.  Echocardiogram pending.    H/o GI bleed:  Gastric ulcers with NSAID use.  Currently hemoglobin is around 10 , which is close to baseline.  Monitor hemoglobin closely. Repeat H&h this am.  Resume PPI.    ETOH use:  On CIWA, no withdrawal symptoms.    Hypertension:  Sub optimal.  Increase coreg to 6.25 mg bid.  Resume rest of the bp meds.   Hyperlipidemia. Resume statin.     DVT prophylaxis:heparin Code Status: full code.  Family Communication: none at bedside.  Disposition Plan: pending further evaluation. .   Consultants:   Cardiology.   Procedures: echocardiogram.    Antimicrobials: none.    Subjective: Chest pain free.   Objective: Vitals:   02/11/17 2030 02/12/17 0026 02/12/17 0431 02/12/17 0732  BP: (!) 165/102 (!) 163/81 (!) 141/80 (!) 150/104  Pulse: 99 91 90 92  Resp: '16  20 20  '$ Temp: 98.4 F (36.9 C) 98 F (36.7 C) 98.7 F (37.1 C) 98.1 F (36.7 C)  TempSrc: Oral Oral Oral Oral  SpO2: 98%  98% 97%  Weight: 95.3 kg (210 lb)  94.2 kg (207 lb 11.2 oz)   Height: '5\' 11"'$  (1.803  m)       Intake/Output Summary (Last 24 hours) at 02/12/17 0929 Last data filed at 02/12/17 1027  Gross per 24 hour  Intake           525.63 ml  Output                0 ml  Net           525.63 ml   Filed Weights   02/11/17 2030 02/12/17 0431  Weight: 95.3 kg (210 lb) 94.2 kg (207 lb 11.2 oz)    Examination:  General exam: Appears calm and comfortable  Respiratory system: Clear to auscultation. Respiratory effort normal. Cardiovascular system: S1 & S2 heard, RRR. No JVD, murmurs, rubs, gallops or clicks. No pedal edema. Gastrointestinal system: Abdomen is nondistended, soft and nontender. No organomegaly or masses felt. Normal bowel sounds heard. Central nervous system: Alert and oriented. No focal neurological deficits. Extremities: Symmetric 5 x 5 power. Skin: No rashes, lesions or ulcers Psychiatry: Judgement and insight appear normal. Mood & affect appropriate.     Data Reviewed: I have personally reviewed following labs and imaging studies  CBC:  Recent Labs Lab 02/11/17 1607  WBC 4.9  HGB 10.9*  HCT 35.1*  MCV 73.9*  PLT 253   Basic Metabolic Panel:  Recent Labs Lab 02/11/17 1607  NA 134*  K 3.9  CL 99*  CO2 25  GLUCOSE  123*  BUN 14  CREATININE 1.00  CALCIUM 9.3   GFR: Estimated Creatinine Clearance: 65.6 mL/min (by C-G formula based on SCr of 1 mg/dL). Liver Function Tests: No results for input(s): AST, ALT, ALKPHOS, BILITOT, PROT, ALBUMIN in the last 168 hours.  Recent Labs Lab 02/11/17 1930  LIPASE 31   No results for input(s): AMMONIA in the last 168 hours. Coagulation Profile: No results for input(s): INR, PROTIME in the last 168 hours. Cardiac Enzymes:  Recent Labs Lab 02/12/17 0008 02/12/17 0657  TROPONINI 1.04* 2.21*   BNP (last 3 results) No results for input(s): PROBNP in the last 8760 hours. HbA1C: No results for input(s): HGBA1C in the last 72 hours. CBG:  Recent Labs Lab 02/12/17 0730  GLUCAP 130*   Lipid  Profile:  Recent Labs  02/12/17 0657  CHOL 145  HDL 43  LDLCALC 85  TRIG 83  CHOLHDL 3.4   Thyroid Function Tests: No results for input(s): TSH, T4TOTAL, FREET4, T3FREE, THYROIDAB in the last 72 hours. Anemia Panel: No results for input(s): VITAMINB12, FOLATE, FERRITIN, TIBC, IRON, RETICCTPCT in the last 72 hours. Sepsis Labs: No results for input(s): PROCALCITON, LATICACIDVEN in the last 168 hours.  No results found for this or any previous visit (from the past 240 hour(s)).       Radiology Studies: Dg Chest 2 View  Result Date: 02/11/2017 CLINICAL DATA:  83 year old presenting with acute onset of chest pain radiating into both arms and into the neck that began approximately 4 hours ago. Current history of hypertension. EXAM: CHEST  2 VIEW COMPARISON:  09/01/2016, 11/13/2014 and earlier. FINDINGS: Cardiac silhouette normal in size, unchanged. Thoracic aorta tortuous and atherosclerotic, unchanged. Linear scarring in the lower lobes and lingula, unchanged. Nodules in the upper lobes, dense for their small size, unchanged dating back to 2016 and therefore indicating calcified granulomata. No new nodules in either lung. Pulmonary vascularity normal. Bronchovascular markings. No pleural effusions. No pneumothorax. Degenerative changes involving the thoracic and upper lumbar spine. IMPRESSION: No acute cardiopulmonary disease. Stable scarring in the lower lobes and lingula. Stable small calcified granulomata in the upper lobes. Electronically Signed   By: Evangeline Dakin M.D.   On: 02/11/2017 16:56        Scheduled Meds: . aspirin  81 mg Oral Daily  . atorvastatin  80 mg Oral q1800  . carvedilol  3.125 mg Oral BID WC  . ferrous sulfate  325 mg Oral Q breakfast  . hydrochlorothiazide  25 mg Oral Daily  . lisinopril  20 mg Oral Daily  . multivitamin animal shapes (with Ca/FA)  2 tablet Oral Daily  . pantoprazole  40 mg Oral BID   Continuous Infusions: . heparin 1,300 Units/hr  (02/12/17 0334)     LOS: 0 days    Time spent: 35 minutes.     Hosie Poisson, MD [facility] Pager [phone]   If 7PM-7AM, please contact night-coverage [email or web address] Password [email or web address] 02/12/2017, 9:29 AM

## 2017-02-12 NOTE — Progress Notes (Signed)
CRITICAL VALUE ALERT  Critical value received:  Troponin = 2.21  Date of notification:  02/12/2017  Time of notification:  0815  Critical value read back: Yes  Nurse who received alert:  Hiram Gash, RN  MD notified (1st page):  Dr. Venia Minks  Time of first page: [phone]  MD notified (2nd page):  Time of second page:   Responding MD:  Dr. Venia Minks  Time MD responded:  [phone]

## 2017-02-12 NOTE — Progress Notes (Signed)
ANTICOAGULATION CONSULT NOTE - Initial Consult  Pharmacy Consult for Heparin Indication: chest pain/ACS  Assessment: 83 yo M presents with CP and an elevated troponin to 2.21 (peak). Pharmacy consulted to begin heparin for r/o ACS. Heparin drip began at 0330 this AM, the first heparin level was drawn appropriately and is 0.39 (therapeutic) x 2 on 1300 units/hr. Hemoglobin low stable (10.5), platelets wnl (259).  Goal of Therapy:  Heparin level 0.3-0.7 units/ml Monitor platelets by anticoagulation protocol: Yes   Plan:  Heparin gtt at 1300 units/hr Daily heparin level and CBC   No Known Allergies  Patient Measurements: Height: '5\' 11"'$  (180.3 cm) Weight: 207 lb 11.2 oz (94.2 kg) IBW/kg (Calculated) : 75.3 Heparin Dosing Weight: 95 kg  Vital Signs:    Labs:  Recent Labs  02/11/17 1607 02/12/17 0008 02/12/17 0657 02/12/17 1321 02/12/17 2006  HGB 10.9*  --   --  10.5* 9.8*  HCT 35.1*  --   --  34.8* 30.9*  PLT 259  --   --   --   --   HEPARINUNFRC  --   --   --  0.39 0.38  CREATININE 1.00  --   --   --   --   TROPONINI  --  1.04* 2.21* 2.24*  --     Estimated Creatinine Clearance: 65.6 mL/min (by C-G formula based on SCr of 1 mg/dL).  Levester Fresh, PharmD, BCPS, BCCCP Clinical Pharmacist Clinical phone for 02/12/2017 from 7a-3:30p: [phone] If after 3:30p, please call main pharmacy at: [phone] 02/12/2017 8:44 PM

## 2017-02-12 NOTE — Progress Notes (Signed)
ANTICOAGULATION CONSULT NOTE - Initial Consult  Pharmacy Consult for Heparin Indication: chest pain/ACS  Assessment: 83 yo M presents with CP and an elevated troponin to 2.21 (peak). Pharmacy consulted to begin heparin for r/o ACS. Heparin drip began at 0330 this AM, the first heparin level was drawn appropriately and is 0.39 (therapeutic) on 1300 units/hr. Hemoglobin low stable (10.5), platelets wnl (259).  Goal of Therapy:  Heparin level 0.3-0.7 units/ml Monitor platelets by anticoagulation protocol: Yes   Plan:  Heparin gtt at 1300 units/hr Will f/u confirmatory heparin level in 8 hours Daily heparin level and CBC   No Known Allergies  Patient Measurements: Height: 5' 11" (180.3 cm) Weight: 207 lb 11.2 oz (94.2 kg) IBW/kg (Calculated) : 75.3 Heparin Dosing Weight: 95 kg  Vital Signs: Temp: 98.1 F (36.7 C) (05/05 0732) Temp Source: Oral (05/05 0732) BP: 150/104 (05/05 0732) Pulse Rate: 92 (05/05 0732)  Labs:  Recent Labs  02/11/17 1607 02/12/17 0008 02/12/17 0657 02/12/17 1321  HGB 10.9*  --   --  10.5*  HCT 35.1*  --   --  34.8*  PLT 259  --   --   --   HEPARINUNFRC  --   --   --  0.39  CREATININE 1.00  --   --   --   TROPONINI  --  1.04* 2.21*  --     Estimated Creatinine Clearance: 65.6 mL/min (by C-G formula based on SCr of 1 mg/dL).   Medical History: Past Medical History:  Diagnosis Date  . Alcohol abuse   . Anemia   . Chronic low back pain 10/14/2011  . Colon polyps    hyperplastic (2004, 2010) and adenomatous (1990).    . COLONIC POLYPS, HX OF 03/05/2008  . Esophageal stricture    hx of  . Gastric ulcer   . GERD 03/05/2008  . GERD (gastroesophageal reflux disease) 1994   associated peptic strictures  . HYPERLIPIDEMIA 03/05/2008  . HYPERTENSION 03/05/2008  . Hypertension   . IBS 03/05/2008  . Impaired glucose tolerance 10/12/2011  . Iron deficiency anemia   . LEG CRAMPS 09/02/2010  . PAD (peripheral artery disease) ([name]) 10/14/2011  . PAD  (peripheral artery disease) ([name]) 2013  . PERIPHERAL EDEMA 09/02/2010  . PERIPHERAL NEUROPATHY 03/05/2008  . Prostatitis    hx of  . VARICOSE VEINS, LOWER EXTREMITIES 06/09/2009    Medications:  Prescriptions Prior to Admission  Medication Sig Dispense Refill Last Dose  . acetaminophen (TYLENOL) 325 MG tablet Take 2 tablets (650 mg total) by mouth every 6 (six) hours as needed for mild pain (or Fever >/= 101). (Patient taking differently: Take 325-650 mg by mouth every 6 (six) hours as needed for mild pain (or Fever >/= 101). )   few days ago  . lisinopril-hydrochlorothiazide (PRINZIDE,ZESTORETIC) 20-25 MG tablet Take 1 tablet by mouth daily. 90 tablet 3 02/11/2017 at am  . traMADol (ULTRAM) 50 MG tablet Take 1 tablet (50 mg total) by mouth every 6 (six) hours as needed for moderate pain. 60 tablet 1 couple days ago  . Na Sulfate-K Sulfate-Mg Sulf (SUPREP BOWEL PREP KIT) 17.5-3.13-1.6 GM/180ML SOLN Use as directed (Patient not taking: Reported on 09/29/2016) 2 Bottle 0 Not Taking at Unknown time  . pantoprazole (PROTONIX) 40 MG tablet Take 1 tablet (40 mg total) by mouth 2 (two) times daily. (Patient not taking: Reported on 02/11/2017) 60 tablet 2 Not Taking at Unknown time  . pravastatin (PRAVACHOL) 40 MG tablet Take 1 tablet (40 mg   total) by mouth daily. (Patient not taking: Reported on 02/11/2017) 90 tablet 3 Not Taking at Unknown time    Belia Heman, PharmD PGY1 Pharmacy Resident [phone] (Pager) 02/12/2017 2:27 PM

## 2017-02-12 NOTE — Consult Note (Signed)
Patient ID: CACE OSORTO MRN: [id number], DOB/AGE: Jun 13, 1932   Admit date: 02/11/2017  Reason for Consult: NSTEMI Requesting Physician: Dr. Blaine Hamper, Internal Medicine    Primary Physician: [name], DO Primary Cardiologist: New    Pt. Profile:  Vincent Mcclure is a 83 y.o. male, with a PMH significant for HTN, HLD, prior h/o tobacco use, GERD, PVD, ETOH abuse, PUD, h/o GIB and iron deficiency anemia, who is being seen today for the evaluation of chest pain/ NSTEMI at the request of Dr. Blaine Hamper, Internal Medicine.    Problem List  Past Medical History:  Diagnosis Date  . Alcohol abuse   . Anemia   . Chronic low back pain 10/14/2011  . Colon polyps    hyperplastic (2004, 2010) and adenomatous (1990).    . COLONIC POLYPS, HX OF 03/05/2008  . Esophageal stricture    hx of  . Gastric ulcer   . GERD 03/05/2008  . GERD (gastroesophageal reflux disease) 1994   associated peptic strictures  . HYPERLIPIDEMIA 03/05/2008  . HYPERTENSION 03/05/2008  . Hypertension   . IBS 03/05/2008  . Impaired glucose tolerance 10/12/2011  . Iron deficiency anemia   . LEG CRAMPS 09/02/2010  . PAD (peripheral artery disease) ([place]) 10/14/2011  . PAD (peripheral artery disease) ([place]) 2013  . PERIPHERAL EDEMA 09/02/2010  . PERIPHERAL NEUROPATHY 03/05/2008  . Prostatitis    hx of  . VARICOSE VEINS, LOWER EXTREMITIES 06/09/2009    Past Surgical History:  Procedure Laterality Date  . CATARACT EXTRACTION  bilat  . ESOPHAGOGASTRODUODENOSCOPY N/A 11/14/2014   Procedure: ESOPHAGOGASTRODUODENOSCOPY (EGD);  Surgeon: Jerene Bears, MD;  Location: [facility] ENDOSCOPY;  Service: Endoscopy;  Laterality: N/A;  . LUMBAR SPINE SURGERY  11/2008   Dr Joya Salm     Allergies  No Known Allergies  HPI  Vincent Mcclure is a 83 y.o. male, with a PMH significant for HTN, HLD, prior h/o tobacco use, GERD, PVD, ETOH abuse, PUD, h/o GIB and iron deficiency anemia, who is being seen today for the evaluation of chest pain/ NSTEMI at  the request of Dr. Blaine Hamper, Internal Medicine.   Per records, he had a GIB in 11/2014, in the setting of chronic NSAID and ETOH use. EGD by [facility] showed esophagitis with ulceration in the lower third of the esophagus, medium sized ulcer in the gastric antrum and duodenal inflammation with superficial ulcerations. He was placed on PPI and instructed to avoid NSAIDs and ETOH. Admission Hgb, this admit on 02/11/17 is 10.9. He denies any recent melena. He has refrained from NSAIDs but continues to drink ~6/7 days a week. Combination of beer and vodka. He is also a former smoker. He quit 8 years ago.   He presented to [facility] ED 02/11/17 with a complaint of SSCP. Symptoms have been intermittent for the past 2 months and gradually worsening. Described as substernal chest pressure with radiation to bilateral upper arms. Yesterday, he developed severe 8/10 CP with nausea, no vomiting. Symptoms lasted 30-40 min. No associated dyspnea or diaphoresis. Given the increased severity of his pain, compared to previous episodes, he came to the ED for evaluation.    Initial POC troponin in the ED was negative. However Troponin I troponin abnormal at 1.04 x 1. 2nd and 3rd troponin's pending. He has been placed on IV heparin for NSTEMI.   EKG shows SR with PACs and minimal ST depression in lateral leads. He is currently CP free.  SCr is 1.00, K 3.9, Na  134, Hgb 10.9.   Home Medications  Prior to Admission medications   Medication Sig Start Date End Date Taking? Authorizing Provider  acetaminophen (TYLENOL) 325 MG tablet Take 2 tablets (650 mg total) by mouth every 6 (six) hours as needed for mild pain (or Fever >/= 101). Patient taking differently: Take 325-650 mg by mouth every 6 (six) hours as needed for mild pain (or Fever >/= 101).  11/15/14  Yes Buriev, Arie Sabina, MD  lisinopril-hydrochlorothiazide (PRINZIDE,ZESTORETIC) 20-25 MG tablet Take 1 tablet by mouth daily. 08/11/16  Yes Biagio Borg, MD  traMADol (ULTRAM) 50  MG tablet Take 1 tablet (50 mg total) by mouth every 6 (six) hours as needed for moderate pain. 08/11/16  Yes Biagio Borg, MD  Na Sulfate-K Sulfate-Mg Sulf (SUPREP BOWEL PREP KIT) 17.5-3.13-1.6 GM/180ML SOLN Use as directed Patient not taking: Reported on 09/29/2016 09/06/16   Levin Erp, PA  pantoprazole (PROTONIX) 40 MG tablet Take 1 tablet (40 mg total) by mouth 2 (two) times daily. Patient not taking: Reported on 02/11/2017 09/06/16   Levin Erp, PA  pravastatin (PRAVACHOL) 40 MG tablet Take 1 tablet (40 mg total) by mouth daily. Patient not taking: Reported on 02/11/2017 08/11/16   Biagio Borg, MD    Hospital Medications  . aspirin  81 mg Oral Daily  . atorvastatin  80 mg Oral q1800  . carvedilol  3.125 mg Oral BID WC  . ferrous sulfate  325 mg Oral Q breakfast  . hydrochlorothiazide  25 mg Oral Daily  . lisinopril  20 mg Oral Daily  . multivitamin animal shapes (with Ca/FA)  2 tablet Oral Daily  . pantoprazole  40 mg Oral BID   . heparin 1,300 Units/hr (02/12/17 0334)   acetaminophen, alum & mag hydroxide-simeth, guaiFENesin, morphine injection, nitroGLYCERIN, ondansetron (ZOFRAN) IV, senna-docusate, traMADol, zolpidem  Family History  Family History  Problem Relation Age of Onset  . Cancer Mother     Brain Cancer  . Diabetes Father   . Heart disease Father     CAD  . Hypertension Father   . Stroke Father   . Diabetes Sister   . Stomach cancer Neg Hx   . Pancreatic cancer Neg Hx   . Colon cancer Neg Hx   . Esophageal cancer Neg Hx     Social History  Social History   Social History  . Marital status: Married    Spouse name: N/A  . Number of children: 4  . Years of education: N/A   Occupational History  . Retired Engineer, maintenance    Social History Main Topics  . Smoking status: Former Smoker    Packs/day: 1.25    Years: 50.00    Types: Cigarettes    Quit date: 11/11/2008  . Smokeless tobacco: Former Systems developer    Types: Chew  . Alcohol  use 3.6 oz/week    2 Glasses of wine, 4 Cans of beer per week  . Drug use: No  . Sexual activity: No   Other Topics Concern  . Not on file   Social History Narrative   ** Merged History Encounter **       4 sons     Review of Systems General:  No chills, fever, night sweats or weight changes.  Cardiovascular:  No chest pain, dyspnea on exertion, edema, orthopnea, palpitations, paroxysmal nocturnal dyspnea. Dermatological: No rash, lesions/masses Respiratory: No cough, dyspnea Urologic: No hematuria, dysuria Abdominal:   No nausea, vomiting, diarrhea, bright red blood  per rectum, melena, or hematemesis Neurologic:  No visual changes, wkns, changes in mental status. All other systems reviewed and are otherwise negative except as noted above.  Physical Exam  Blood pressure (!) 150/104, pulse 92, temperature 98.1 F (36.7 C), temperature source Oral, resp. rate 20, height 5' 11"  (1.803 m), weight 207 lb 11.2 oz (94.2 kg), SpO2 97 %.  General: Pleasant, NAD Psych: Normal affect. Neuro: Alert and oriented X 3. Moves all extremities spontaneously. HEENT: Normal  Neck: Supple without bruits or JVD. Lungs:  Resp regular and unlabored, CTA. Heart: RRR no s3, s4, or murmurs. Abdomen: Soft, non-tender, non-distended, BS + x 4.  Extremities: No clubbing, cyanosis or edema. DP/PT/Radials 2+ and equal bilaterally.  Labs  Troponin [facility] Test)  Recent Labs  02/11/17 1617  TROPIPOC 0.03    Recent Labs  02/12/17 0008  TROPONINI 1.04*   Lab Results  Component Value Date   WBC 4.9 02/11/2017   HGB 10.9 (L) 02/11/2017   HCT 35.1 (L) 02/11/2017   MCV 73.9 (L) 02/11/2017   PLT 259 02/11/2017    Recent Labs Lab 02/11/17 1607  NA 134*  K 3.9  CL 99*  CO2 25  BUN 14  CREATININE 1.00  CALCIUM 9.3  GLUCOSE 123*   Lab Results  Component Value Date   CHOL 135 08/11/2016   HDL 52.00 08/11/2016   LDLCALC 68 08/11/2016   TRIG 74.0 08/11/2016   No results found  for: DDIMER   Radiology/Studies  Dg Chest 2 View  Result Date: 02/11/2017 CLINICAL DATA:  83 year old presenting with acute onset of chest pain radiating into both arms and into the neck that began approximately 4 hours ago. Current history of hypertension. EXAM: CHEST  2 VIEW COMPARISON:  09/01/2016, 11/13/2014 and earlier. FINDINGS: Cardiac silhouette normal in size, unchanged. Thoracic aorta tortuous and atherosclerotic, unchanged. Linear scarring in the lower lobes and lingula, unchanged. Nodules in the upper lobes, dense for their small size, unchanged dating back to 2016 and therefore indicating calcified granulomata. No new nodules in either lung. Pulmonary vascularity normal. Bronchovascular markings. No pleural effusions. No pneumothorax. Degenerative changes involving the thoracic and upper lumbar spine. IMPRESSION: No acute cardiopulmonary disease. Stable scarring in the lower lobes and lingula. Stable small calcified granulomata in the upper lobes. Electronically Signed   By: Evangeline Dakin M.D.   On: 02/11/2017 16:56    ECG   EKG shows SR with PACs and minimal ST depression in lateral leads -- personally reviewed  Telemetry  NSR -- personally reviewed   Echocardiogram Pending   ASSESSMENT AND PLAN  Principal Problem:   NSTEMI (non-ST elevated myocardial infarction) ([place]) Active Problems:   HLD (hyperlipidemia)   Anemia, iron deficiency   HTN (hypertension)   VARICOSE VEINS, LOWER EXTREMITIES   GERD   Alcohol use/ h/o heavy abuse   Chest pain   1. NSTEMI: Initial troponin 1.04. EKG shows SR with PACs and slight ST depressions in lateral leads. Currently CP free on IV heparin. Continue to cycle enzymes x 3. Continue ASA with PPI given GI history, high intensity statin (Lipitor 80 mg) along with BB and ACE-I. 2D echo pending. We will plan for [facility] +/- PCI on Monday.    2. H/o GIB/PUD: last admit for GIB was in 2016. EGD showed gastritis/ gastric ulcers, in the setting  of chronic NSAIDS and ETOH. He denies any recent melena. Hgb is 10.9 on admit. Continue on PPI. Continue to monitor.   3. ETOH  Abuse: admits to drinking 6/7 days a week (beer and vodka) on CIWA protocol.  4. HTN: elevated earlier this am. Continue Coreg, lisinopril and HCTZ. Continue to monitor. If BP remains poorly controlled, we can increase dose of Coreg to 6.25 mg BID.   5. HLD: Lipid panel pending. Continue Lipitor 80 mg.   6. Prior H/o Tobacco Abuse: quit 8 years ago by pt report.    Signed, Lyda Jester, PA-C, MHS 02/12/2017, 7:40 AM [facility] Pager: [phone]  Attending note Patient seen and discussed with PA Rosita Fire, I agree with her documentation. 83 yo male history of HTN, HL, PAD, gastic ulcers and GI bleed in 2016, Fe deficient anemia, EtoH abuse admitted with chest pain. Troponin to 2.21, has not peaked. EKG mild ST depressiosn lateral precordial leads. Medical therapy with ASA, atorva 80, coreg, hep gtt, lisinopril. Will plan for cath Monday, given his history of intermittent GI bleeding may need consideration for BMS. F/u echo results. Plan for cath on Monday.   Carlyle Dolly MD

## 2017-02-12 NOTE — Progress Notes (Addendum)
CRITICAL VALUE ALERT  Critical value received:  Troponin 1.04  Date of notification:  02/12/2017   Time of notification:  0205  Critical value read back: yes  Nurse who received alert:  Rickiya Picariello  MD notified (1st page):  Donnal Debar  Time of first page:  0210  MD notified (2nd page):  Time of second page:  Responding MD:  Donnal Debar  Time MD responded:  0220  Heparin started. 4000 u bolus given

## 2017-02-13 LAB — HEMOGLOBIN AND HEMATOCRIT, BLOOD
HCT: 30.1 % — ABNORMAL LOW (ref 39.0–52.0)
HEMATOCRIT: 32.8 % — AB (ref 39.0–52.0)
HEMOGLOBIN: 10.3 g/dL — AB (ref 13.0–17.0)
Hemoglobin: 9.5 g/dL — ABNORMAL LOW (ref 13.0–17.0)

## 2017-02-13 LAB — GLUCOSE, CAPILLARY
Glucose-Capillary: 129 mg/dL — ABNORMAL HIGH (ref 65–99)
Glucose-Capillary: 110 mg/dL — ABNORMAL HIGH (ref 65–99)
Glucose-Capillary: 112 mg/dL — ABNORMAL HIGH (ref 65–99)

## 2017-02-13 LAB — HEMOGLOBIN A1C
HEMOGLOBIN A1C: 5.9 % — AB (ref 4.8–5.6)
Mean Plasma Glucose: 123 mg/dL

## 2017-02-13 LAB — CBC
HEMATOCRIT: 32.4 % — AB (ref 39.0–52.0)
Hemoglobin: 10.2 g/dL — ABNORMAL LOW (ref 13.0–17.0)
MCH: 23.6 pg — AB (ref 26.0–34.0)
MCHC: 31.5 g/dL (ref 30.0–36.0)
MCV: 74.8 fL — ABNORMAL LOW (ref 78.0–100.0)
Platelets: 222 10*3/uL (ref 150–400)
RBC: 4.33 MIL/uL (ref 4.22–5.81)
RDW: 17.3 % — AB (ref 11.5–15.5)
WBC: 4.4 10*3/uL (ref 4.0–10.5)

## 2017-02-13 LAB — HEPARIN LEVEL (UNFRACTIONATED): HEPARIN UNFRACTIONATED: 0.57 [IU]/mL (ref 0.30–0.70)

## 2017-02-13 LAB — TROPONIN I: Troponin I: 1.03 ng/mL (ref <0.03)

## 2017-02-13 MED ORDER — SODIUM CHLORIDE 0.9 % WEIGHT BASED INFUSION
3.0000 mL/kg/h | INTRAVENOUS | Status: AC
  Administered 2017-02-14: 3 mL/kg/h via INTRAVENOUS

## 2017-02-13 MED ORDER — SODIUM CHLORIDE 0.9% FLUSH
3.0000 mL | Freq: Two times a day (BID) | INTRAVENOUS | Status: DC
  Administered 2017-02-13: 3 mL via INTRAVENOUS

## 2017-02-13 MED ORDER — SODIUM CHLORIDE 0.9% FLUSH: 3.0000 mL | INTRAVENOUS | Status: DC | PRN

## 2017-02-13 MED ORDER — SODIUM CHLORIDE 0.9 % IV SOLN: 250.0000 mL | INTRAVENOUS | Status: DC | PRN

## 2017-02-13 MED ORDER — ASPIRIN 81 MG PO CHEW
81.0000 mg | CHEWABLE_TABLET | ORAL | Status: AC
  Administered 2017-02-14: 81 mg via ORAL
  Filled 2017-02-13: qty 1

## 2017-02-13 MED ORDER — SODIUM CHLORIDE 0.9 % WEIGHT BASED INFUSION
1.0000 mL/kg/h | INTRAVENOUS | Status: DC
  Administered 2017-02-14: 1 mL/kg/h via INTRAVENOUS

## 2017-02-13 NOTE — Progress Notes (Signed)
PROGRESS NOTE    Vincent Mcclure  LDJ:[id number] DOB: 1931/12/27 DOA: 02/11/2017 PCP: [name], DO   Brief Narrative: Vincent Mcclure is a 83 y.o. male with medical history significant of hypertension, hyperlipidemia, GERD, varicose vein in lower extremities, PVD, IBS, gastric ulcer, GI bleeding, alcohol abuse, iron deficiency anemia, who presents with chest pain.  Assessment & Plan:   Principal Problem:   NSTEMI (non-ST elevated myocardial infarction) ([name]) Active Problems:   HLD (hyperlipidemia)   Anemia, iron deficiency   HTN (hypertension)   VARICOSE VEINS, LOWER EXTREMITIES   GERD   Alcohol use/ h/o heavy abuse   Chest pain   NSTEMI:  Admitted for further evaluation.  On IV heparin, cardiology consulted and plan for cath in the near future.  Currently chest pain free.  Cardiac enzymes trending up.  Resume aspirin, BB, ace inhibitor, and lipitor.  Echocardiogram pending.  Plan for cardiac in am.    H/o GI bleed:  Gastric ulcers with NSAID use.  Currently hemoglobin is around 10 , which is close to baseline.  Monitor hemoglobin closely.  Resume PPI.    ETOH use:  On CIWA, no withdrawal symptoms.    Hypertension:  Well controlled. Resume coreg.    Hyperlipidemia. Resume statin.     DVT prophylaxis:heparin Code Status: full code.  Family Communication: none at bedside.  Disposition Plan: pending further evaluation. .   Consultants:   Cardiology.   Procedures: echocardiogram.    Antimicrobials: none.    Subjective: Chest pain free.   Objective: Vitals:   02/12/17 0431 02/12/17 0732 02/12/17 2055 02/13/17 0500  BP: (!) 141/80 (!) 150/104 (!) 108/59 133/70  Pulse: 90 92 88 84  Resp: '20 20 18 18  '$ Temp: 98.7 F (37.1 C) 98.1 F (36.7 C) 98.6 F (37 C) 97.6 F (36.4 C)  TempSrc: Oral Oral Oral Oral  SpO2: 98% 97% 98% 97%  Weight: 94.2 kg (207 lb 11.2 oz)   93.6 kg (206 lb 4.8 oz)  Height:        Intake/Output Summary (Last 24  hours) at 02/13/17 1011 Last data filed at 02/13/17 0500  Gross per 24 hour  Intake              946 ml  Output                0 ml  Net              946 ml   Filed Weights   02/11/17 2030 02/12/17 0431 02/13/17 0500  Weight: 95.3 kg (210 lb) 94.2 kg (207 lb 11.2 oz) 93.6 kg (206 lb 4.8 oz)    Examination:  General exam: Appears calm and comfortable  Respiratory system: Clear to auscultation. Respiratory effort normal. Cardiovascular system: S1 & S2 heard, RRR. No JVD, murmurs, rubs, gallops or clicks. No pedal edema. Gastrointestinal system: Abdomen is nondistended, soft and nontender. No organomegaly or masses felt. Normal bowel sounds heard. Central nervous system: Alert and oriented. No focal neurological deficits. Extremities: Symmetric 5 x 5 power. Skin: No rashes, lesions or ulcers Psychiatry: Judgement and insight appear normal. Mood & affect appropriate.     Data Reviewed: I have personally reviewed following labs and imaging studies  CBC:  Recent Labs Lab 02/11/17 1607 02/12/17 1321 02/12/17 2006 02/13/17 0245 02/13/17 0922  WBC 4.9  --   --  4.4  --   HGB 10.9* 10.5* 9.8* 10.2* 10.3*  HCT 35.1* 34.8* 30.9* 32.4* 32.8*  MCV  73.9*  --   --  74.8*  --   PLT 259  --   --  222  --    Basic Metabolic Panel:  Recent Labs Lab 02/11/17 1607  NA 134*  K 3.9  CL 99*  CO2 25  GLUCOSE 123*  BUN 14  CREATININE 1.00  CALCIUM 9.3   GFR: Estimated Creatinine Clearance: 65.4 mL/min (by C-G formula based on SCr of 1 mg/dL). Liver Function Tests: No results for input(s): AST, ALT, ALKPHOS, BILITOT, PROT, ALBUMIN in the last 168 hours.  Recent Labs Lab 02/11/17 1930  LIPASE 31   No results for input(s): AMMONIA in the last 168 hours. Coagulation Profile: No results for input(s): INR, PROTIME in the last 168 hours. Cardiac Enzymes:  Recent Labs Lab 02/12/17 0008 02/12/17 0657 02/12/17 1321  TROPONINI 1.04* 2.21* 2.24*   BNP (last 3 results) No  results for input(s): PROBNP in the last 8760 hours. HbA1C: No results for input(s): HGBA1C in the last 72 hours. CBG:  Recent Labs Lab 02/12/17 0730 02/12/17 1138 02/12/17 1657 02/12/17 2058 02/13/17 0730  GLUCAP 130* 108* 111* 120* 129*   Lipid Profile:  Recent Labs  02/12/17 0657  CHOL 145  HDL 43  LDLCALC 85  TRIG 83  CHOLHDL 3.4   Thyroid Function Tests: No results for input(s): TSH, T4TOTAL, FREET4, T3FREE, THYROIDAB in the last 72 hours. Anemia Panel: No results for input(s): VITAMINB12, FOLATE, FERRITIN, TIBC, IRON, RETICCTPCT in the last 72 hours. Sepsis Labs: No results for input(s): PROCALCITON, LATICACIDVEN in the last 168 hours.  No results found for this or any previous visit (from the past 240 hour(s)).       Radiology Studies: Dg Chest 2 View  Result Date: 02/11/2017 CLINICAL DATA:  83 year old presenting with acute onset of chest pain radiating into both arms and into the neck that began approximately 4 hours ago. Current history of hypertension. EXAM: CHEST  2 VIEW COMPARISON:  09/01/2016, 11/13/2014 and earlier. FINDINGS: Cardiac silhouette normal in size, unchanged. Thoracic aorta tortuous and atherosclerotic, unchanged. Linear scarring in the lower lobes and lingula, unchanged. Nodules in the upper lobes, dense for their small size, unchanged dating back to 2016 and therefore indicating calcified granulomata. No new nodules in either lung. Pulmonary vascularity normal. Bronchovascular markings. No pleural effusions. No pneumothorax. Degenerative changes involving the thoracic and upper lumbar spine. IMPRESSION: No acute cardiopulmonary disease. Stable scarring in the lower lobes and lingula. Stable small calcified granulomata in the upper lobes. Electronically Signed   By: Evangeline Dakin M.D.   On: 02/11/2017 16:56        Scheduled Meds: . aspirin  81 mg Oral Daily  . atorvastatin  80 mg Oral q1800  . carvedilol  6.25 mg Oral BID WC  . ferrous  sulfate  325 mg Oral Q breakfast  . folic acid  1 mg Oral Daily  . hydrochlorothiazide  25 mg Oral Daily  . lisinopril  20 mg Oral Daily  . multivitamin animal shapes (with Ca/FA)  2 tablet Oral Daily  . pantoprazole  40 mg Oral BID  . thiamine  100 mg Oral Daily   Or  . thiamine  100 mg Intravenous Daily   Continuous Infusions: . heparin 1,300 Units/hr (02/12/17 1654)     LOS: 1 day    Time spent: 35 minutes.     Hosie Poisson, MD [facility] Pager [phone]   If 7PM-7AM, please contact night-coverage [email or web address] Password [email or web address] 02/13/2017, 10:11 AM

## 2017-02-13 NOTE — Progress Notes (Signed)
[place] for Heparin Indication: chest pain/ACS  Assessment: 83 yo M presents with CP and an elevated troponin to 2.24 (peak), to continue on IV heparin. Cath planned for tomorrow. Heparin level remains therapeutic at 0.57 on 1300 units/hr. Hgb 10.3 stable, Plt wnl, no bleeding noted  Goal of Therapy:  Heparin level 0.3-0.7 units/ml Monitor platelets by anticoagulation protocol: Yes   Plan:  Continue heparin gtt at 1300 units/hr Daily heparin level and CBC   No Known Allergies  Patient Measurements: Height: '5\' 11"'$  (180.3 cm) Weight: 206 lb 4.8 oz (93.6 kg) IBW/kg (Calculated) : 75.3 Heparin Dosing Weight: 95 kg  Vital Signs: Temp: 97.6 F (36.4 C) (05/06 0500) Temp Source: Oral (05/06 0500) BP: 133/70 (05/06 0500) Pulse Rate: 84 (05/06 0500)  Labs:  Recent Labs  02/11/17 1607  02/12/17 0657 02/12/17 1321 02/12/17 2006 02/13/17 0245 02/13/17 0922  HGB 10.9*  --   --  10.5* 9.8* 10.2* 10.3*  HCT 35.1*  --   --  34.8* 30.9* 32.4* 32.8*  PLT 259  --   --   --   --  222  --   HEPARINUNFRC  --   --   --  0.39 0.38 0.57  --   CREATININE 1.00  --   --   --   --   --   --   TROPONINI  --   < > 2.21* 2.24*  --   --  1.03*  < > = values in this interval not displayed.  Estimated Creatinine Clearance: 65.4 mL/min (by C-G formula based on SCr of 1 mg/dL).  Gwenlyn Perking, PharmD PGY1 Pharmacy Resident 02/13/2017 10:55 AM

## 2017-02-13 NOTE — Plan of Care (Signed)
Problem: Pain Managment: Goal: General experience of comfort will improve Outcome: Progressing Patient given Tylenol for a headache, relief noted by patient

## 2017-02-13 NOTE — Progress Notes (Addendum)
Progress Note  Patient Name: Vincent Mcclure Date of Encounter: 02/13/2017   Subjective   No compliaints  Inpatient Medications    Scheduled Meds: . aspirin  81 mg Oral Daily  . atorvastatin  80 mg Oral q1800  . carvedilol  6.25 mg Oral BID WC  . ferrous sulfate  325 mg Oral Q breakfast  . folic acid  1 mg Oral Daily  . hydrochlorothiazide  25 mg Oral Daily  . lisinopril  20 mg Oral Daily  . multivitamin animal shapes (with Ca/FA)  2 tablet Oral Daily  . pantoprazole  40 mg Oral BID  . thiamine  100 mg Oral Daily   Or  . thiamine  100 mg Intravenous Daily   Continuous Infusions: . heparin 1,300 Units/hr (02/12/17 1654)   PRN Meds: acetaminophen, alum & mag hydroxide-simeth, guaiFENesin, LORazepam **OR** LORazepam, morphine injection, nitroGLYCERIN, ondansetron (ZOFRAN) IV, senna-docusate, traMADol, zolpidem   Vital Signs    Vitals:   02/12/17 0431 02/12/17 0732 02/12/17 2055 02/13/17 0500  BP: (!) 141/80 (!) 150/104 (!) 108/59 133/70  Pulse: 90 92 88 84  Resp: '20 20 18 18  '$ Temp: 98.7 F (37.1 C) 98.1 F (36.7 C) 98.6 F (37 C) 97.6 F (36.4 C)  TempSrc: Oral Oral Oral Oral  SpO2: 98% 97% 98% 97%  Weight: 207 lb 11.2 oz (94.2 kg)   206 lb 4.8 oz (93.6 kg)  Height:        Intake/Output Summary (Last 24 hours) at 02/13/17 0852 Last data filed at 02/13/17 0500  Gross per 24 hour  Intake              946 ml  Output                0 ml  Net              946 ml   Filed Weights   02/11/17 2030 02/12/17 0431 02/13/17 0500  Weight: 210 lb (95.3 kg) 207 lb 11.2 oz (94.2 kg) 206 lb 4.8 oz (93.6 kg)    Telemetry    - Personally Reviewed- SR, occaisonal PACs  ECG     - Personally Reviewed  Physical Exam   GEN: No acute distress.   Neck: No JVD Cardiac: RRR, no murmurs, rubs, or gallops.  Respiratory: Clear to auscultation bilaterally. GI: Soft, nontender, non-distended  MS: No edema; No deformity. Neuro:  Nonfocal  Psych: Normal affect   Labs      Chemistry Recent Labs Lab 02/11/17 1607  NA 134*  K 3.9  CL 99*  CO2 25  GLUCOSE 123*  BUN 14  CREATININE 1.00  CALCIUM 9.3  GFRNONAA >60  GFRAA >60  ANIONGAP 10     Hematology Recent Labs Lab 02/11/17 1607 02/12/17 1321 02/12/17 2006 02/13/17 0245  WBC 4.9  --   --  4.4  RBC 4.75  --   --  4.33  HGB 10.9* 10.5* 9.8* 10.2*  HCT 35.1* 34.8* 30.9* 32.4*  MCV 73.9*  --   --  74.8*  MCH 22.9*  --   --  23.6*  MCHC 31.1  --   --  31.5  RDW 17.0*  --   --  17.3*  PLT 259  --   --  222    Cardiac Enzymes Recent Labs Lab 02/12/17 0008 02/12/17 0657 02/12/17 1321  TROPONINI 1.04* 2.21* 2.24*    Recent Labs Lab 02/11/17 1617  TROPIPOC 0.03     BNPNo  results for input(s): BNP, PROBNP in the last 168 hours.   DDimer No results for input(s): DDIMER in the last 168 hours.   Radiology    Dg Chest 2 View  Result Date: 02/11/2017 CLINICAL DATA:  83 year old presenting with acute onset of chest pain radiating into both arms and into the neck that began approximately 4 hours ago. Current history of hypertension. EXAM: CHEST  2 VIEW COMPARISON:  09/01/2016, 11/13/2014 and earlier. FINDINGS: Cardiac silhouette normal in size, unchanged. Thoracic aorta tortuous and atherosclerotic, unchanged. Linear scarring in the lower lobes and lingula, unchanged. Nodules in the upper lobes, dense for their small size, unchanged dating back to 2016 and therefore indicating calcified granulomata. No new nodules in either lung. Pulmonary vascularity normal. Bronchovascular markings. No pleural effusions. No pneumothorax. Degenerative changes involving the thoracic and upper lumbar spine. IMPRESSION: No acute cardiopulmonary disease. Stable scarring in the lower lobes and lingula. Stable small calcified granulomata in the upper lobes. Electronically Signed   By: Evangeline Dakin M.D.   On: 02/11/2017 16:56    Cardiac Studies     Patient Profile   83 yo male history of HTN, HL, PAD, gastic  ulcers and GI bleed in 2016, Fe deficient anemia, EtoH abuse admitted with chest pain and NSTEMI   Assessment & Plan    1. NSTEMI - trop up to 2.2, awaiting peak.  - echo pending - medical therapy with ASA, atorva 80, coreg 6.'25mg'$  bid, hep gtt, lisionpril - plan for cath tomorrow  2. H/o GIB/PUD: last admit for GIB was in 2016. EGD showed gastritis/ gastric ulcers, in the setting of chronic NSAIDS and ETOH. He denies any recent melena. Hgb is 10.9 on admit. Continue on PPI. Continue to monitor.   3. ETOH Abuse: admits to drinking 6/7 days a week (beer and vodka) on CIWA protocol.   I have reviewed the risks, indications, and alternatives to cardiac catheterization, possible angioplasty, and stenting with the patient today. Risks include but are not limited to bleeding, infection, vascular injury, stroke, myocardial infection, arrhythmia, kidney injury, radiation-related injury in the case of prolonged fluoroscopy use, emergency cardiac surgery, and death. The patient understands the risks of serious complication is 1-2 in 4132 with diagnostic cardiac cath and 1-2% or less with angioplasty/stenting.  Merrily Pew, MD  02/13/2017, 8:52 AM

## 2017-02-14 ENCOUNTER — Inpatient Hospital Stay (HOSPITAL_COMMUNITY): Payer: Medicare HMO

## 2017-02-14 ENCOUNTER — Encounter (HOSPITAL_COMMUNITY)
Admission: EM | Disposition: A | Discharge status: Home / Self Care | Payer: Self-pay | Source: Home / Self Care | Attending: Cardiothoracic Surgery

## 2017-02-14 ENCOUNTER — Other Ambulatory Visit: Payer: Self-pay | Admitting: *Deleted

## 2017-02-14 ENCOUNTER — Encounter (HOSPITAL_COMMUNITY): Payer: Self-pay | Admitting: Interventional Cardiology

## 2017-02-14 DIAGNOSIS — I2510 Atherosclerotic heart disease of native coronary artery without angina pectoris: Secondary | ICD-10-CM

## 2017-02-14 DIAGNOSIS — E782 Mixed hyperlipidemia: Secondary | ICD-10-CM

## 2017-02-14 DIAGNOSIS — D509 Iron deficiency anemia, unspecified: Secondary | ICD-10-CM

## 2017-02-14 DIAGNOSIS — Z01810 Encounter for preprocedural cardiovascular examination: Secondary | ICD-10-CM

## 2017-02-14 DIAGNOSIS — I25110 Atherosclerotic heart disease of native coronary artery with unstable angina pectoris: Secondary | ICD-10-CM

## 2017-02-14 DIAGNOSIS — R072 Precordial pain: Secondary | ICD-10-CM

## 2017-02-14 DIAGNOSIS — Z789 Other specified health status: Secondary | ICD-10-CM

## 2017-02-14 DIAGNOSIS — K219 Gastro-esophageal reflux disease without esophagitis: Secondary | ICD-10-CM

## 2017-02-14 HISTORY — PX: LEFT HEART CATH AND CORONARY ANGIOGRAPHY: CATH118249

## 2017-02-14 LAB — COMPREHENSIVE METABOLIC PANEL
ALT: 11 U/L — AB (ref 17–63)
AST: 18 U/L (ref 15–41)
Albumin: 3.4 g/dL — ABNORMAL LOW (ref 3.5–5.0)
Alkaline Phosphatase: 64 U/L (ref 38–126)
Anion gap: 8 (ref 5–15)
BILIRUBIN TOTAL: 0.5 mg/dL (ref 0.3–1.2)
BUN: 14 mg/dL (ref 6–20)
CO2: 22 mmol/L (ref 22–32)
CREATININE: 1.18 mg/dL (ref 0.61–1.24)
Calcium: 8.5 mg/dL — ABNORMAL LOW (ref 8.9–10.3)
Chloride: 102 mmol/L (ref 101–111)
GFR, EST NON AFRICAN AMERICAN: 55 mL/min — AB (ref >60)
Glucose, Bld: 100 mg/dL — ABNORMAL HIGH (ref 65–99)
POTASSIUM: 3.7 mmol/L (ref 3.5–5.1)
Sodium: 132 mmol/L — ABNORMAL LOW (ref 135–145)
TOTAL PROTEIN: 5.7 g/dL — AB (ref 6.5–8.1)

## 2017-02-14 LAB — BASIC METABOLIC PANEL
ANION GAP: 9 (ref 5–15)
BUN: 14 mg/dL (ref 6–20)
CHLORIDE: 99 mmol/L — AB (ref 101–111)
CO2: 23 mmol/L (ref 22–32)
Calcium: 8.9 mg/dL (ref 8.9–10.3)
Creatinine, Ser: 1.18 mg/dL (ref 0.61–1.24)
GFR calc non Af Amer: 55 mL/min — ABNORMAL LOW (ref >60)
Glucose, Bld: 107 mg/dL — ABNORMAL HIGH (ref 65–99)
Potassium: 3.7 mmol/L (ref 3.5–5.1)
Sodium: 131 mmol/L — ABNORMAL LOW (ref 135–145)

## 2017-02-14 LAB — VAS US DOPPLER PRE CABG
LEFT ECA DIAS: -17 cm/s
LEFT VERTEBRAL DIAS: -22 cm/s
Left CCA dist dias: 22 cm/s
Left CCA dist sys: 88 cm/s
Left CCA prox dias: 31 cm/s
Left CCA prox sys: 123 cm/s
Left ICA dist dias: -16 cm/s
Left ICA dist sys: -61 cm/s
Left ICA prox dias: -19 cm/s
Left ICA prox sys: -95 cm/s
RIGHT ECA DIAS: -12 cm/s
RIGHT VERTEBRAL DIAS: -15 cm/s
Right CCA prox dias: 31 cm/s
Right CCA prox sys: 191 cm/s
Right cca dist sys: -84 cm/s

## 2017-02-14 LAB — ECHOCARDIOGRAM COMPLETE
AVLVOTPG: 3 mmHg
E/e' ratio: 8.44
EWDT: 301 ms
FS: 34 % (ref 28–44)
HEIGHTINCHES: 71 in
IV/PV OW: 1.11
LA ID, A-P, ES: 35 mm
LA diam end sys: 35 mm
LA vol: 55.6 mL
LADIAMINDEX: 1.59 cm/m2
LAVOLA4C: 49.3 mL
LAVOLIN: 25.2 mL/m2
LV E/e' medial: 8.44
LV E/e'average: 8.44
LV SIMPSON'S DISK: 49
LV TDI E'LATERAL: 9.03
LV TDI E'MEDIAL: 7.62
LV dias vol index: 39 mL/m2
LV dias vol: 86 mL (ref 62–150)
LV e' LATERAL: 9.03 cm/s
LV sys vol index: 20 mL/m2
LVOT SV: 64 mL
LVOT VTI: 18.5 cm
LVOT area: 3.46 cm2
LVOT diameter: 21 mm
LVOT peak vel: 81.3 cm/s
LVSYSVOL: 44 mL (ref 21–61)
Lateral S' vel: 13.8 cm/s
MV Dec: 301
MV Peak grad: 2 mmHg
MVPKAVEL: 74.7 m/s
MVPKEVEL: 76.2 m/s
PW: 9 mm — AB (ref 0.6–1.1)
RV sys press: 28 mmHg
Reg peak vel: 251 cm/s
Stroke v: 42 ml
TAPSE: 21.9 mm
TRMAXVEL: 251 cm/s
Weight: 3361.6 oz

## 2017-02-14 LAB — PULMONARY FUNCTION TEST
FEF 25-75 Post: 1.85 L/sec
FEF 25-75 Pre: 1.16 L/sec
FEF2575-%Change-Post: 60 %
FEF2575-%Pred-Post: 97 %
FEF2575-%Pred-Pre: 60 %
FEV1-%Change-Post: 11 %
FEV1-%Pred-Post: 72 %
FEV1-%Pred-Pre: 65 %
FEV1-Post: 2.09 L
FEV1-Pre: 1.88 L
FEV1FVC-%Change-Post: -7 %
FEV1FVC-%Pred-Pre: 97 %
FEV6-%Change-Post: 14 %
FEV6-%Pred-Post: 81 %
FEV6-%Pred-Pre: 70 %
FEV6-Post: 3.08 L
FEV6-Pre: 2.68 L
FEV6FVC-%Change-Post: -4 %
FEV6FVC-%Pred-Post: 101 %
FEV6FVC-%Pred-Pre: 106 %
FVC-%Change-Post: 20 %
FVC-%Pred-Post: 79 %
FVC-%Pred-Pre: 66 %
FVC-Post: 3.24 L
FVC-Pre: 2.7 L
Post FEV1/FVC ratio: 64 %
Post FEV6/FVC ratio: 95 %
Pre FEV1/FVC ratio: 69 %
Pre FEV6/FVC Ratio: 99 %

## 2017-02-14 LAB — CBC
HEMATOCRIT: 31.6 % — AB (ref 39.0–52.0)
HEMOGLOBIN: 9.8 g/dL — AB (ref 13.0–17.0)
MCH: 22.9 pg — AB (ref 26.0–34.0)
MCHC: 31 g/dL (ref 30.0–36.0)
MCV: 73.8 fL — ABNORMAL LOW (ref 78.0–100.0)
Platelets: 210 10*3/uL (ref 150–400)
RBC: 4.28 MIL/uL (ref 4.22–5.81)
RDW: 16.7 % — ABNORMAL HIGH (ref 11.5–15.5)
WBC: 4.4 10*3/uL (ref 4.0–10.5)

## 2017-02-14 LAB — URINALYSIS, ROUTINE W REFLEX MICROSCOPIC
BILIRUBIN URINE: NEGATIVE
Glucose, UA: NEGATIVE mg/dL
Hgb urine dipstick: NEGATIVE
Ketones, ur: NEGATIVE mg/dL
Leukocytes, UA: NEGATIVE
Nitrite: NEGATIVE
Protein, ur: NEGATIVE mg/dL
Specific Gravity, Urine: 1.006 (ref 1.005–1.030)
pH: 6 (ref 5.0–8.0)

## 2017-02-14 LAB — GLUCOSE, CAPILLARY
GLUCOSE-CAPILLARY: 112 mg/dL — AB (ref 65–99)
GLUCOSE-CAPILLARY: 106 mg/dL — AB (ref 65–99)
Glucose-Capillary: 100 mg/dL — ABNORMAL HIGH (ref 65–99)
Glucose-Capillary: 85 mg/dL (ref 65–99)

## 2017-02-14 LAB — PROTIME-INR
INR: 1.19
PROTHROMBIN TIME: 15.2 s (ref 11.4–15.2)

## 2017-02-14 LAB — HEMOGLOBIN AND HEMATOCRIT, BLOOD
HCT: 30.9 % — ABNORMAL LOW (ref 39.0–52.0)
HEMATOCRIT: 31.9 % — AB (ref 39.0–52.0)
HEMOGLOBIN: 10 g/dL — AB (ref 13.0–17.0)
HEMOGLOBIN: 10 g/dL — AB (ref 13.0–17.0)

## 2017-02-14 LAB — APTT: aPTT: 71 seconds — ABNORMAL HIGH (ref 24–36)

## 2017-02-14 LAB — HEPARIN LEVEL (UNFRACTIONATED): Heparin Unfractionated: 0.82 IU/mL — ABNORMAL HIGH (ref 0.30–0.70)

## 2017-02-14 SURGERY — LEFT HEART CATH AND CORONARY ANGIOGRAPHY
Anesthesia: LOCAL

## 2017-02-14 MED ORDER — MIDAZOLAM HCL 2 MG/2ML IJ SOLN
INTRAMUSCULAR | Status: DC | PRN
  Administered 2017-02-14: 1 mg via INTRAVENOUS

## 2017-02-14 MED ORDER — DEXTROSE 5 % IV SOLN
1.5000 g | INTRAVENOUS | Status: DC
  Filled 2017-02-14 (×2): qty 1.5

## 2017-02-14 MED ORDER — FENTANYL CITRATE (PF) 100 MCG/2ML IJ SOLN
INTRAMUSCULAR | Status: DC | PRN
  Administered 2017-02-14: 50 ug via INTRAVENOUS

## 2017-02-14 MED ORDER — SODIUM CHLORIDE 0.9% FLUSH: 3.0000 mL | INTRAVENOUS | Status: DC | PRN

## 2017-02-14 MED ORDER — IOPAMIDOL (ISOVUE-370) INJECTION 76%
INTRAVENOUS | Status: AC
  Filled 2017-02-14: qty 100

## 2017-02-14 MED ORDER — ATORVASTATIN CALCIUM 80 MG PO TABS: 80.0000 mg | ORAL_TABLET | Freq: Every day | ORAL | Status: DC

## 2017-02-14 MED ORDER — IOPAMIDOL (ISOVUE-370) INJECTION 76%
INTRAVENOUS | Status: DC | PRN
  Administered 2017-02-14: 80 mL via INTRA_ARTERIAL

## 2017-02-14 MED ORDER — TRANEXAMIC ACID 1000 MG/10ML IV SOLN
1.5000 mg/kg/h | INTRAVENOUS | Status: DC
  Administered 2017-02-15: 1.5 mg/kg/h via INTRAVENOUS
  Filled 2017-02-14 (×2): qty 25

## 2017-02-14 MED ORDER — ALBUTEROL SULFATE (2.5 MG/3ML) 0.083% IN NEBU
2.5000 mg | INHALATION_SOLUTION | Freq: Once | RESPIRATORY_TRACT | Status: AC
  Administered 2017-02-14: 2.5 mg via RESPIRATORY_TRACT

## 2017-02-14 MED ORDER — HEPARIN (PORCINE) IN NACL 2-0.9 UNIT/ML-% IJ SOLN
INTRAMUSCULAR | Status: AC
  Filled 2017-02-14: qty 1000

## 2017-02-14 MED ORDER — VERAPAMIL HCL 2.5 MG/ML IV SOLN
INTRAVENOUS | Status: DC | PRN
  Administered 2017-02-14: 10 mL via INTRA_ARTERIAL

## 2017-02-14 MED ORDER — SODIUM CHLORIDE 0.9 % IV SOLN: INTRAVENOUS | Status: AC

## 2017-02-14 MED ORDER — CHLORHEXIDINE GLUCONATE CLOTH 2 % EX PADS
6.0000 | MEDICATED_PAD | Freq: Once | CUTANEOUS | Status: AC
  Administered 2017-02-14: 6 via TOPICAL

## 2017-02-14 MED ORDER — DOPAMINE-DEXTROSE 3.2-5 MG/ML-% IV SOLN
0.0000 ug/kg/min | INTRAVENOUS | Status: DC
  Filled 2017-02-14: qty 250

## 2017-02-14 MED ORDER — SODIUM CHLORIDE 0.9 % IV SOLN
INTRAVENOUS | Status: DC
  Filled 2017-02-14 (×2): qty 2.5

## 2017-02-14 MED ORDER — HEPARIN SODIUM (PORCINE) 1000 UNIT/ML IJ SOLN
INTRAMUSCULAR | Status: AC
  Filled 2017-02-14: qty 1

## 2017-02-14 MED ORDER — TRANEXAMIC ACID (OHS) PUMP PRIME SOLUTION
2.0000 mg/kg | INTRAVENOUS | Status: DC
  Filled 2017-02-14 (×2): qty 1.91

## 2017-02-14 MED ORDER — FENTANYL CITRATE (PF) 100 MCG/2ML IJ SOLN
INTRAMUSCULAR | Status: AC
  Filled 2017-02-14: qty 2

## 2017-02-14 MED ORDER — MIDAZOLAM HCL 2 MG/2ML IJ SOLN
INTRAMUSCULAR | Status: AC
  Filled 2017-02-14: qty 2

## 2017-02-14 MED ORDER — HEPARIN SODIUM (PORCINE) 1000 UNIT/ML IJ SOLN
INTRAMUSCULAR | Status: DC | PRN
  Administered 2017-02-14: 5000 [IU] via INTRAVENOUS

## 2017-02-14 MED ORDER — CHLORHEXIDINE GLUCONATE CLOTH 2 % EX PADS
6.0000 | MEDICATED_PAD | Freq: Once | CUTANEOUS | Status: AC
  Administered 2017-02-15: 6 via TOPICAL

## 2017-02-14 MED ORDER — SODIUM CHLORIDE 0.9 % IV SOLN
INTRAVENOUS | Status: DC
  Filled 2017-02-14 (×2): qty 30

## 2017-02-14 MED ORDER — TRANEXAMIC ACID (OHS) BOLUS VIA INFUSION
15.0000 mg/kg | INTRAVENOUS | Status: DC
Start: 2017-02-15 — End: 2017-02-15
  Administered 2017-02-15: 1429.5 mg via INTRAVENOUS
  Filled 2017-02-14: qty 1430

## 2017-02-14 MED ORDER — ACETAMINOPHEN 325 MG PO TABS: 650.0000 mg | ORAL_TABLET | ORAL | Status: DC | PRN

## 2017-02-14 MED ORDER — ONDANSETRON HCL 4 MG/2ML IJ SOLN: 4.0000 mg | Freq: Four times a day (QID) | INTRAMUSCULAR | Status: DC | PRN

## 2017-02-14 MED ORDER — DEXMEDETOMIDINE HCL IN NACL 400 MCG/100ML IV SOLN
0.1000 ug/kg/h | INTRAVENOUS | Status: DC
  Filled 2017-02-14 (×2): qty 100

## 2017-02-14 MED ORDER — SODIUM CHLORIDE 0.9 % IV SOLN
30.0000 ug/min | INTRAVENOUS | Status: DC
  Filled 2017-02-14 (×2): qty 2

## 2017-02-14 MED ORDER — VERAPAMIL HCL 2.5 MG/ML IV SOLN
INTRAVENOUS | Status: AC
  Filled 2017-02-14: qty 2

## 2017-02-14 MED ORDER — LIDOCAINE HCL (PF) 1 % IJ SOLN
INTRAMUSCULAR | Status: DC | PRN
  Administered 2017-02-14: 2 mL via INTRADERMAL

## 2017-02-14 MED ORDER — LIDOCAINE HCL 1 % IJ SOLN
INTRAMUSCULAR | Status: AC
  Filled 2017-02-14: qty 20

## 2017-02-14 MED ORDER — BISACODYL 5 MG PO TBEC
5.0000 mg | DELAYED_RELEASE_TABLET | Freq: Once | ORAL | Status: AC
  Administered 2017-02-14: 5 mg via ORAL
  Filled 2017-02-14: qty 1

## 2017-02-14 MED ORDER — ASPIRIN 81 MG PO CHEW: 81.0000 mg | CHEWABLE_TABLET | Freq: Every day | ORAL | Status: DC

## 2017-02-14 MED ORDER — NITROGLYCERIN IN D5W 200-5 MCG/ML-% IV SOLN
2.0000 ug/min | INTRAVENOUS | Status: DC
  Filled 2017-02-14: qty 250

## 2017-02-14 MED ORDER — VANCOMYCIN HCL 10 G IV SOLR
1500.0000 mg | INTRAVENOUS | Status: DC
  Filled 2017-02-14 (×2): qty 1500

## 2017-02-14 MED ORDER — EPINEPHRINE PF 1 MG/ML IJ SOLN
0.0000 ug/min | INTRAMUSCULAR | Status: DC
  Filled 2017-02-14 (×2): qty 4

## 2017-02-14 MED ORDER — METOPROLOL TARTRATE 12.5 MG HALF TABLET
12.5000 mg | ORAL_TABLET | Freq: Once | ORAL | Status: AC
  Administered 2017-02-15: 12.5 mg via ORAL
  Filled 2017-02-14: qty 1

## 2017-02-14 MED ORDER — SODIUM CHLORIDE 0.9 % IV SOLN: 250.0000 mL | INTRAVENOUS | Status: DC | PRN

## 2017-02-14 MED ORDER — CHLORHEXIDINE GLUCONATE 0.12 % MT SOLN
15.0000 mL | Freq: Once | OROMUCOSAL | Status: AC
  Administered 2017-02-15: 15 mL via OROMUCOSAL
  Filled 2017-02-14: qty 15

## 2017-02-14 MED ORDER — TEMAZEPAM 15 MG PO CAPS
15.0000 mg | ORAL_CAPSULE | Freq: Once | ORAL | Status: AC | PRN
  Administered 2017-02-14: 15 mg via ORAL
  Filled 2017-02-14: qty 1

## 2017-02-14 MED ORDER — SODIUM CHLORIDE 0.9% FLUSH
3.0000 mL | Freq: Two times a day (BID) | INTRAVENOUS | Status: DC
  Administered 2017-02-14: 3 mL via INTRAVENOUS

## 2017-02-14 MED ORDER — HEPARIN (PORCINE) IN NACL 2-0.9 UNIT/ML-% IJ SOLN
INTRAMUSCULAR | Status: DC | PRN
  Administered 2017-02-14: 1000 mL

## 2017-02-14 MED ORDER — MAGNESIUM SULFATE 50 % IJ SOLN
40.0000 meq | INTRAMUSCULAR | Status: DC
  Filled 2017-02-14 (×2): qty 10

## 2017-02-14 MED ORDER — OXYCODONE-ACETAMINOPHEN 5-325 MG PO TABS: 1.0000 | ORAL_TABLET | ORAL | Status: DC | PRN

## 2017-02-14 MED ORDER — HEPARIN (PORCINE) IN NACL 100-0.45 UNIT/ML-% IJ SOLN
1100.0000 [IU]/h | INTRAMUSCULAR | Status: DC
  Administered 2017-02-14: 1100 [IU]/h via INTRAVENOUS
  Filled 2017-02-14: qty 250

## 2017-02-14 MED ORDER — DEXTROSE 5 % IV SOLN
750.0000 mg | INTRAVENOUS | Status: DC
  Filled 2017-02-14 (×2): qty 750

## 2017-02-14 MED ORDER — POTASSIUM CHLORIDE 2 MEQ/ML IV SOLN
80.0000 meq | INTRAVENOUS | Status: DC
  Filled 2017-02-14 (×2): qty 40

## 2017-02-14 MED ORDER — PAPAVERINE HCL 30 MG/ML IJ SOLN
INTRAMUSCULAR | Status: DC
  Filled 2017-02-14 (×2): qty 2.5

## 2017-02-14 SURGICAL SUPPLY — 12 items
CATH INFINITI 5 FR JL3.5 (CATHETERS) ×1 IMPLANT
CATH INFINITI JR4 5F (CATHETERS) ×1 IMPLANT
COVER PRB 48X5XTLSCP FOLD TPE (BAG) IMPLANT
COVER PROBE 5X48 (BAG) ×2
DEVICE RAD COMP TR BAND LRG (VASCULAR PRODUCTS) ×1 IMPLANT
GLIDESHEATH SLEND A-KIT 6F 22G (SHEATH) ×1 IMPLANT
GUIDEWIRE INQWIRE 1.5J.035X260 (WIRE) IMPLANT
INQWIRE 1.5J .035X260CM (WIRE) ×2
KIT HEART LEFT (KITS) ×2 IMPLANT
PACK CARDIAC CATHETERIZATION (CUSTOM PROCEDURE TRAY) ×2 IMPLANT
TRANSDUCER W/STOPCOCK (MISCELLANEOUS) ×2 IMPLANT
TUBING CIL FLEX 10 FLL-RA (TUBING) ×2 IMPLANT

## 2017-02-14 NOTE — Progress Notes (Addendum)
ANTICOAGULATION CONSULT NOTE- follow up  Pharmacy Consult for Heparin Indication: chest pain/ACS  Assessment: 83 yo M presents with CP and an elevated troponin to 2.24 (peak),on  IV heparin. Cardiac cath done this AM and surgical consultation via TCTS for CABG was recommended. S/p cath, heparin drip to resume 8 hours post sheath removal. Removed at 08:59AM. No bleeding or hematoma reported. Prior to cardiac cath this morning, pharmacist decreased heparin drip rate to 1100 units/hr.  Goal of Therapy:  Heparin level 0.3-0.7 units/ml Monitor platelets by anticoagulation protocol: Yes   Plan:  At 17:00 today (8 hours post sheath removal) resume IV heparin gtt at 1100 units/hr Check ~8 hour Heparin level Daily heparin level and CBC Monitor for s/sx of bleeding  No Known Allergies  Patient Measurements: Height: '5\' 11"'$  (180.3 cm) Weight: 210 lb 1.6 oz (95.3 kg) IBW/kg (Calculated) : 75.3 Heparin Dosing Weight: 95 kg  Vital Signs: Temp: 97.8 F (36.6 C) (05/07 0520) Temp Source: Oral (05/07 0520) BP: 112/67 (05/07 0936) Pulse Rate: 70 (05/07 0936)  Labs:  Recent Labs  02/11/17 1607  02/12/17 0657  02/12/17 1321 02/12/17 2006 02/13/17 0245 02/13/17 0922 02/13/17 2122 02/14/17 0504  HGB 10.9*  --   --   --  10.5* 9.8* 10.2* 10.3* 9.5* 9.8*  HCT 35.1*  --   --   --  34.8* 30.9* 32.4* 32.8* 30.1* 31.6*  PLT 259  --   --   --   --   --  222  --   --  210  LABPROT  --   --   --   --   --   --   --   --   --  15.2  INR  --   --   --   --   --   --   --   --   --  1.19  HEPARINUNFRC  --   --   --   < > 0.39 0.38 0.57  --   --  0.82*  CREATININE 1.00  --   --   --   --   --   --   --   --  1.18  TROPONINI  --   < > 2.21*  --  2.24*  --   --  1.03*  --   --   < > = values in this interval not displayed.  Estimated Creatinine Clearance: 55.9 mL/min (by C-G formula based on SCr of 1.18 mg/dL).  Thank you for allowing pharmacy to be part of this patients care team. Nicole Cella,  [place] Clinical Pharmacist Pager: [phone]-4P [phone] 4P-10P [phone] [place] [phone] 02/14/2017 10:03 AM

## 2017-02-14 NOTE — Progress Notes (Signed)
Patient Name: Vincent Mcclure Date of Encounter: 02/14/2017  Principal Problem:   NSTEMI (non-ST elevated myocardial infarction) [facility]) Active Problems:   HLD (hyperlipidemia)   Anemia, iron deficiency   HTN (hypertension)   VARICOSE VEINS, LOWER EXTREMITIES   GERD   Alcohol use/ h/o heavy abuse   Chest pain   Length of Stay: 2  SUBJECTIVE  The patient denies chest pain or SOB.   CURRENT MEDS . aspirin  81 mg Oral Daily  . atorvastatin  80 mg Oral q1800  . carvedilol  6.25 mg Oral BID WC  . ferrous sulfate  325 mg Oral Q breakfast  . folic acid  1 mg Oral Daily  . hydrochlorothiazide  25 mg Oral Daily  . lisinopril  20 mg Oral Daily  . multivitamin animal shapes (with Ca/FA)  2 tablet Oral Daily  . pantoprazole  40 mg Oral BID  . sodium chloride flush  3 mL Intravenous Q12H  . thiamine  100 mg Oral Daily   Or  . thiamine  100 mg Intravenous Daily   OBJECTIVE  Vitals:   02/14/17 0900 02/14/17 0904 02/14/17 0921 02/14/17 0936  BP:   105/75 112/67  Pulse: (!) 0 (!) 0 75 70  Resp: 11 (!) 0    Temp:      TempSrc:      SpO2: (!) 0% (!) 0% 94% 97%  Weight:      Height:        Intake/Output Summary (Last 24 hours) at 02/14/17 1029 Last data filed at 02/14/17 0500  Gross per 24 hour  Intake              480 ml  Output              360 ml  Net              120 ml   Filed Weights   02/12/17 0431 02/13/17 0500 02/14/17 0520  Weight: 207 lb 11.2 oz (94.2 kg) 206 lb 4.8 oz (93.6 kg) 210 lb 1.6 oz (95.3 kg)    PHYSICAL EXAM  General: Pleasant, NAD. Neuro: Alert and oriented X 3. Moves all extremities spontaneously. Psych: Normal affect. HEENT:  Normal  Neck: Supple without bruits or JVD. Lungs:  Resp regular and unlabored, CTA. Heart: RRR no s3, s4, or murmurs. Abdomen: Soft, non-tender, non-distended, BS + x 4.  Extremities: No clubbing, cyanosis or edema. DP/PT/Radials 2+ and equal bilaterally.  Accessory Clinical Findings  CBC  Recent Labs   02/13/17 0245  02/14/17 0504 02/14/17 0929  WBC 4.4  --  4.4  --   HGB 10.2*  < > 9.8* 10.0*  HCT 32.4*  < > 31.6* 30.9*  MCV 74.8*  --  73.8*  --   PLT 222  --  210  --   < > = values in this interval not displayed. Basic Metabolic Panel  Recent Labs  02/11/17 1607 02/14/17 0504  NA 134* 131*  K 3.9 3.7  CL 99* 99*  CO2 25 23  GLUCOSE 123* 107*  BUN 14 14  CREATININE 1.00 1.18  CALCIUM 9.3 8.9   Liver Function Tests No results for input(s): AST, ALT, ALKPHOS, BILITOT, PROT, ALBUMIN in the last 72 hours.  Recent Labs  02/11/17 1930  LIPASE 31   Cardiac Enzymes  Recent Labs  02/12/17 0657 02/12/17 1321 02/13/17 0922  TROPONINI 2.21* 2.24* 1.03*   BNP Invalid input(s): POCBNP D-Dimer No results for input(s):  DDIMER in the last 72 hours. Hemoglobin A1C  Recent Labs  02/12/17 0657  HGBA1C 5.9*   Fasting Lipid Panel  Recent Labs  02/12/17 0657  CHOL 145  HDL 43  LDLCALC 85  TRIG 83  CHOLHDL 3.4   Radiology/Studies  Dg Chest 2 View  Result Date: 02/11/2017 CLINICAL DATA:  83 year old presenting with acute onset of chest pain radiating into both arms and into the neck that began approximately 4 hours ago. Current history of hypertension. EXAM: CHEST  2 VIEW COMPARISON:  09/01/2016, 11/13/2014 and earlier. FINDINGS: Cardiac silhouette normal in size, unchanged. Thoracic aorta tortuous and atherosclerotic, unchanged. Linear scarring in the lower lobes and lingula, unchanged. Nodules in the upper lobes, dense for their small size, unchanged dating back to 2016 and therefore indicating calcified granulomata. No new nodules in either lung. Pulmonary vascularity normal. Bronchovascular markings. No pleural effusions. No pneumothorax. Degenerative changes involving the thoracic and upper lumbar spine. IMPRESSION: No acute cardiopulmonary disease. Stable scarring in the lower lobes and lingula. Stable small calcified granulomata in the upper lobes. Electronically  Signed   By: Evangeline Dakin M.D.   On: 02/11/2017 16:56   TELE:   ECG:  LHC:   Heavily calcified left main and proximal to mid LAD.  Eccentric 75% distal left main  Mid LAD [place] 0, 1, 1 bifurcation stenosis with 90% LAD and 75% second diagonal.  Subtotal occlusion, 99%, of the moderate sized first diagonal, possibly the source of the patient's presentation.  Ostial 80% circumflex. The distal circumflex is relatively small with 2 small to intermediate size branches distally.  Mid to distal 70% PDA. The right coronary is dominant and otherwise widely patent.  Mild hypokinesis mid anterior wall, likely due to occlusion of the first diagonal. Overall LV function is normal with EF 55%. Normal left ventricular filling pressures.  Recommendations:   Surgical consultation via TCTS for consideration of bypass surgery due to left main coronary disease and complex mid LAD stenoses.    ASSESSMENT AND PLAN  Principal Problem:   NSTEMI (non-ST elevated myocardial infarction) ([place]) Active Problems:   HLD (hyperlipidemia)   Anemia, iron deficiency   HTN (hypertension)   VARICOSE VEINS, LOWER EXTREMITIES   GERD   Alcohol use/ h/o heavy abuse   Chest pain  1. NSTEMI: Initial troponin 1.04. Cath showed 3 VD including 75% in the left main artery, CT surgery consult pending,. Dr Servando Snare to see.   - Mild hypokinesis mid anterior wall, likely due to occlusion of the first diagonal. - continue ASA, atorvastatin, carvedilol, lisinopril  2. H/o GIB/PUD: last admit for GIB was in 2016. EGD showed gastritis/ gastric ulcers, in the setting of chronic NSAIDS and ETOH. He denies any recent melena. Hgb is 10.9 on admit. Continue on PPI. Continue to monitor.   3. ETOH Abuse: admits to drinking 6/7 days a week (beer and vodka) on CIWA protocol.  4. HTN: well controlled  5. HLD: Lipid panel pending. Continue Lipitor 80 mg.   6. Prior H/o Tobacco Abuse: quit 8 years ago by pt report.    Signed, Ena Dawley MD, [facility] 02/14/2017

## 2017-02-14 NOTE — Consult Note (Signed)
Vincent Mcclure       Pattison,Vincent Mcclure             [phone]        Vincent Mcclure Vincent Mcclure #[id number] Date of Birth: 03/10/1932  Referring: Vincent Mcclure Primary Care: Vincent Dance, DO  Chief Complaint:    Chief Complaint  Patient presents with  . Chest Pain   History of Present Illness:      Mr. Vincent Mcclure is an 83 yo white male with known history of HTN, HLD, H/O Gastric Ulcers with GI Bleed in 2016,  previous nicotine abuse, and current daily alcohol use.  He presented to the ED on 02/11/2017 with complaints of chest pain.  The patient states the pain was extreme pressure that started at his elbow with extension into shoulders and chest.  There was associated dry heaves with this.  He states this has been occurring for the past several months, but has been getting worse.  Workup in the ED ruled the patient in for a NSTEMI and he was placed on Heparin and admitted for further care.  He underwent cardiac catheterization today which showed preserved EF and multivessel CAD.  It was felt coronary bypass grafting would be indicated and TCTS consult was obtained.  The patient currently is chest pain free.  He admitted continues to consume daily alcohol of 3(14 oz) beers and 2 strong mix drinks per day.  He used to smoke 1.5 ppd for 58 yrs but quit over 9 years ago.  He is accompanied by his wife who helped provide history.  The patient denies any current hemoptysis or problems with his ulcers.  He does admit to be fairly inactive with intense pain in his lower legs walking 30 yards to the mailbox.  He does have to rest to complete this.  The patients wife thinks he has early signs of dementia/alzhiemers, but the patient denies this.  He wishes to proceed with surgery if indicated.  Current Activity/ Functional Status: Patient is independent with mobility/ambulation, transfers, ADL's, IADL's.   Zubrod Score: At the time of surgery this patient's most appropriate  activity status/level should be described as: []     0    Normal activity, no symptoms []     1    Restricted in physical strenuous activity but ambulatory, able to do out light work [x]     2    Ambulatory and capable of self care, unable to do work activities, up and about                 more than 50%  Of the time                            []     3    Only limited self care, in bed greater than 50% of waking hours []     4    Completely disabled, no self care, confined to bed or chair []     5    Moribund  Past Medical History:  Diagnosis Date  . Alcohol abuse   . Anemia   . Chronic low back pain 10/14/2011  . Colon polyps    hyperplastic (2004, 2010) and adenomatous (1990).    . COLONIC POLYPS, HX OF 03/05/2008  . Esophageal stricture    hx of  . Gastric ulcer   . GERD 03/05/2008  . GERD (gastroesophageal reflux disease) 1994  associated peptic strictures  . HYPERLIPIDEMIA 03/05/2008  . HYPERTENSION 03/05/2008  . Hypertension   . IBS 03/05/2008  . Impaired glucose tolerance 10/12/2011  . Iron deficiency anemia   . LEG CRAMPS 09/02/2010  . PAD (peripheral artery disease) ([place]) 10/14/2011  . PAD (peripheral artery disease) ([place]) 2013  . PERIPHERAL EDEMA 09/02/2010  . PERIPHERAL NEUROPATHY 03/05/2008  . Prostatitis    hx of  . VARICOSE VEINS, LOWER EXTREMITIES 06/09/2009    Past Surgical History:  Procedure Laterality Date  . CATARACT EXTRACTION  bilat  . ESOPHAGOGASTRODUODENOSCOPY N/A 11/14/2014   Procedure: ESOPHAGOGASTRODUODENOSCOPY (EGD);  Surgeon: Jerene Bears, MD;  Location: [facility] ENDOSCOPY;  Service: Endoscopy;  Laterality: N/A;  . LEFT HEART CATH AND CORONARY ANGIOGRAPHY N/A 02/14/2017   Procedure: Left Heart Cath and Coronary Angiography;  Surgeon: Belva Crome, MD;  Location: [facility];  Service: Cardiovascular;  Laterality: N/A;  . LUMBAR SPINE SURGERY  11/2008   Dr Joya Salm    History  Smoking Status  . Former Smoker  . Packs/day: 1.25  . Years: 50.00  . Types:  Cigarettes  . Quit date: 11/11/2008  Smokeless Tobacco  . Former Systems developer  . Types: Chew    History  Alcohol Use  . 3.6 oz/week  . 2 Glasses of wine, 4 Cans of beer per week    Social History   Social History  . Marital status: Married    Spouse name: N/A  . Number of children: 4  . Years of education: N/A   Occupational History  . Retired Engineer, maintenance    Social History Main Topics  . Smoking status: Former Smoker    Packs/day: 1.25    Years: 50.00    Types: Cigarettes    Quit date: 11/11/2008  . Smokeless tobacco: Former Systems developer    Types: Chew  . Alcohol use 3.6 oz/week    2 Glasses of wine, 4 Cans of beer per week  . Drug use: No  . Sexual activity: No   Other Topics Concern  . Not on file   Social History Narrative   ** Merged History Encounter **       4 sons    Allergies  Allergen Reactions  . No Known Allergies     Current Facility-Administered Medications  Medication Dose Route Frequency Provider Last Rate Last Dose  . 0.9 %  sodium chloride infusion   Intravenous Continuous Belva Crome, MD      . 0.9 %  sodium chloride infusion  250 mL Intravenous PRN Belva Crome, MD      . acetaminophen (TYLENOL) tablet 650 mg  650 mg Oral Q6H PRN Ivor Costa, MD   650 mg at 02/13/17 1420  . acetaminophen (TYLENOL) tablet 650 mg  650 mg Oral Q4H PRN Belva Crome, MD      . alum & mag hydroxide-simeth (MAALOX/MYLANTA) 200-200-20 MG/5ML suspension 30 mL  30 mL Oral Q6H PRN Ivor Costa, MD      . aspirin chewable tablet 81 mg  81 mg Oral Daily Ivor Costa, MD   81 mg at 02/14/17 0950  . atorvastatin (LIPITOR) tablet 80 mg  80 mg Oral q1800 Hosie Poisson, MD   80 mg at 02/13/17 1716  . carvedilol (COREG) tablet 6.25 mg  6.25 mg Oral BID WC Hosie Poisson, MD   6.25 mg at 02/14/17 0747  . ferrous sulfate tablet 325 mg  325 mg Oral Q breakfast Ivor Costa, MD   [phone]  mg at 02/14/17 0747  . folic acid (FOLVITE) tablet 1 mg  1 mg Oral Daily Hosie Poisson, MD   1 mg at 02/14/17  0949  . guaiFENesin (MUCINEX) 12 hr tablet 1,200 mg  1,200 mg Oral BID PRN Ivor Costa, MD      . heparin ADULT infusion 100 units/mL (25000 units/225m sodium chloride 0.45%)  1,100 Units/hr Intravenous Continuous SBelva Crome MD      . hydrochlorothiazide (HYDRODIURIL) tablet 25 mg  25 mg Oral Daily NIvor Costa MD   25 mg at 02/14/17 0949  . lisinopril (PRINIVIL,ZESTRIL) tablet 20 mg  20 mg Oral Daily NIvor Costa MD   20 mg at 02/14/17 0949  . LORazepam (ATIVAN) tablet 1 mg  1 mg Oral Q6H PRN AHosie Poisson MD       Or  . LORazepam (ATIVAN) injection 1 mg  1 mg Intravenous Q6H PRN AHosie Poisson MD      . morphine 4 MG/ML injection 2 mg  2 mg Intravenous Q4H PRN NIvor Costa MD      . multivitamin animal shapes (with Ca/FA) chewable tablet 2 tablet  2 tablet Oral Daily NIvor Costa MD   2 tablet at 02/14/17 0950  . nitroGLYCERIN (NITROSTAT) SL tablet 0.4 mg  0.4 mg Sublingual Q5 min PRN NIvor Costa MD      . ondansetron ([facility] injection 4 mg  4 mg Intravenous Q6H PRN SBelva Crome MD      . oxyCODONE-acetaminophen (PERCOCET/ROXICET) 5-325 MG per tablet 1-2 tablet  1-2 tablet Oral Q4H PRN SBelva Crome MD      . pantoprazole (PROTONIX) EC tablet 40 mg  40 mg Oral BID NIvor Costa MD   40 mg at 02/14/17 0949  . senna-docusate (Senokot-S) tablet 1 tablet  1 tablet Oral QHS PRN NIvor Costa MD      . sodium chloride flush (NS) 0.9 % injection 3 mL  3 mL Intravenous Q12H SBelva Crome MD      . sodium chloride flush (NS) 0.9 % injection 3 mL  3 mL Intravenous PRN SBelva Crome MD      . thiamine (VITAMIN B-1) tablet 100 mg  100 mg Oral Daily AHosie Poisson MD   100 mg at 02/14/17 09622  Or  . thiamine (B-1) injection 100 mg  100 mg Intravenous Daily AHosie Poisson MD      . traMADol (Veatrice Bourbon tablet 50 mg  50 mg Oral Q6H PRN NIvor Costa MD      . zolpidem (AMBIEN) tablet 5 mg  5 mg Oral QHS PRN NIvor Costa MD   5 mg at 02/13/17 2201    Prescriptions Prior to Admission  Medication Sig  Dispense Refill Last Dose  . acetaminophen (TYLENOL) 325 MG tablet Take 2 tablets (650 mg total) by mouth every 6 (six) hours as needed for mild pain (or Fever >/= 101). (Patient taking differently: Take 325-650 mg by mouth every 6 (six) hours as needed for mild pain (or Fever >/= 101). )   few days ago  . lisinopril-hydrochlorothiazide (PRINZIDE,ZESTORETIC) 20-25 MG tablet Take 1 tablet by mouth daily. 90 tablet 3 02/11/2017 at am  . traMADol (ULTRAM) 50 MG tablet Take 1 tablet (50 mg total) by mouth every 6 (six) hours as needed for moderate pain. 60 tablet 1 couple days ago  . Na Sulfate-K Sulfate-Mg Sulf (SUPREP BOWEL PREP KIT) 17.5-3.13-1.6 GM/180ML SOLN Use as directed (Patient not taking: Reported on 09/29/2016) 2 Bottle 0 Not  Taking at Unknown time  . pantoprazole (PROTONIX) 40 MG tablet Take 1 tablet (40 mg total) by mouth 2 (two) times daily. (Patient not taking: Reported on 02/11/2017) 60 tablet 2 Not Taking at Unknown time  . pravastatin (PRAVACHOL) 40 MG tablet Take 1 tablet (40 mg total) by mouth daily. (Patient not taking: Reported on 02/11/2017) 90 tablet 3 Not Taking at Unknown time    Family History  Problem Relation Age of Onset  . Cancer Mother     Brain Cancer  . Diabetes Father   . Heart disease Father     CAD  . Hypertension Father   . Stroke Father   . Diabetes Sister   . Stomach cancer Neg Hx   . Pancreatic cancer Neg Hx   . Colon cancer Neg Hx   . Esophageal cancer Neg Hx    Review of Systems:  Constitutional: negative for chills, fatigue and night sweats Eyes: negative Respiratory: negative for dyspnea on exertion Cardiovascular: positive for chest pain, chest pressure/discomfort, claudication and exertional chest pressure/discomfort Gastrointestinal: positive for H/O GI Bleed, gastric ulcers Hematologic/lymphatic: positive for varicose veins bilateral lower extremities below the knee R>L Neurological: positive for memory problems and per wife Endocrine: negative,  denies diabetes     Cardiac Review of Systems: Y or N  Chest Pain [ y   ]  Resting SOB [   ] Exertional SOB  [  ]  Orthopnea [  ]   Pedal Edema [   ]    Palpitations [  ] Syncope  [  ]   Presyncope [   ]  General Review of Systems: [Y] = yes [  ]=no Constitional: recent weight change [  ]; anorexia [  ]; fatigue [  ]; nausea [  ]; night sweats [  ]; fever [  ]; or chills [  ]                                                               Dental: poor dentition[  ]; Last Dentist visit: Dentures  Eye : blurred vision [  ]; diplopia [   ]; vision changes [  ];  Amaurosis fugax[  ]; Resp: cough [  ];  wheezing[  ];  hemoptysis[N  ]; shortness of breath[  ]; paroxysmal nocturnal dyspnea[  ]; dyspnea on exertion[  ]; or orthopnea[  ];  GI:  gallstones[  ], vomiting[ Y ];  dysphagia[  ]; melena[N  ];  hematochezia [  ]; heartburn[  ];   Hx of  Colonoscopy[  ]; GU: kidney stones [  ]; hematuria[  ];   dysuria [  ];  nocturia[  ];  history of     obstruction [  ]; urinary frequency [  ]             Skin: rash, swelling[  ];, hair loss[  ];  peripheral edema[  ];  or itching[  ]; Musculosketetal: myalgias[  ];  joint swelling[  ];  joint erythema[  ];  joint pain[  ];  back pain[  ];  Heme/Lymph: bruising[  ];  bleeding[  ];  anemia[  ];  Neuro: TIA[  ];  headaches[  ];  stroke[  ];  vertigo[  ];  seizures[  ];   paresthesias[  ];  difficulty walking[  ];  Psych:depression[  ]; anxiety[  ];  Endocrine: diabetes[N  ];  thyroid dysfunction[  ];  Immunizations: Flu [  ]; Pneumococcal[  ];  Other:  Physical Exam: BP 91/72   Pulse (!) 58   Temp 97.8 F (36.6 C) (Oral)   Resp (!) 0   Ht 5' 11"  (1.803 m)   Wt 210 lb 1.6 oz (95.3 kg)   SpO2 96%   BMI 29.30 kg/m    General appearance: alert, no distress and appears mildly confused at times during conversation Head: Normocephalic, without obvious abnormality, atraumatic Neck: no adenopathy, no carotid bruit, no JVD, supple, symmetrical, trachea  midline and thyroid not enlarged, symmetric, no tenderness/mass/nodules Resp: clear to auscultation bilaterally Cardio: regular rate and rhythm GI: soft, non-tender; bowel sounds normal; no masses,  no organomegaly Extremities: varicose veins noted Neurologic: Grossly normal  Diagnostic Studies & Laboratory data:     Recent Radiology Findings:  Dg Chest 2 View  Result Date: 02/11/2017 CLINICAL DATA:  83 year old presenting with acute onset of chest pain radiating into both arms and into the neck that began approximately 4 hours ago. Current history of hypertension. EXAM: CHEST  2 VIEW COMPARISON:  09/01/2016, 11/13/2014 and earlier. FINDINGS: Cardiac silhouette normal in size, unchanged. Thoracic aorta tortuous and atherosclerotic, unchanged. Linear scarring in the lower lobes and lingula, unchanged. Nodules in the upper lobes, dense for their small size, unchanged dating back to 2016 and therefore indicating calcified granulomata. No new nodules in either lung. Pulmonary vascularity normal. Bronchovascular markings. No pleural effusions. No pneumothorax. Degenerative changes involving the thoracic and upper lumbar spine. IMPRESSION: No acute cardiopulmonary disease. Stable scarring in the lower lobes and lingula. Stable small calcified granulomata in the upper lobes. Electronically Signed   By: Evangeline Dakin M.D.   On: 02/11/2017 16:56    I have independently reviewed the above radiologic studies.  Recent Lab Findings: Lab Results  Component Value Date   WBC 4.4 02/14/2017   HGB 10.0 (L) 02/14/2017   HCT 30.9 (L) 02/14/2017   PLT 210 02/14/2017   GLUCOSE 107 (H) 02/14/2017   CHOL 145 02/12/2017   TRIG 83 02/12/2017   HDL 43 02/12/2017   LDLDIRECT 133.2 10/14/2011   LDLCALC 85 02/12/2017   ALT 12 (L) 09/01/2016   AST 20 09/01/2016   NA 131 (L) 02/14/2017   K 3.7 02/14/2017   CL 99 (L) 02/14/2017   CREATININE 1.18 02/14/2017   BUN 14 02/14/2017   CO2 23 02/14/2017   TSH 1.10  08/11/2016   INR 1.19 02/14/2017   HGBA1C 5.9 (H) 02/12/2017   [facility]; Procedures   Left Heart Cath and Coronary Angiography  Conclusion    Heavily calcified left main and proximal to mid LAD.  Eccentric 75% distal left main  Mid LAD [place] 0, 1, 1 bifurcation stenosis with 90% LAD and 75% second diagonal.  Subtotal occlusion, 99%, of the moderate sized first diagonal, possibly the source of the patient's presentation.  Ostial 80% circumflex. The distal circumflex is relatively small with 2 small to intermediate size branches distally.  Mid to distal 70% PDA. The right coronary is dominant and otherwise widely patent.  Mild hypokinesis mid anterior wall, likely due to occlusion of the first diagonal. Overall LV function is normal with EF 55%. Normal left ventricular filling pressures.  Recommendations:   Surgical consultation via TCTS for consideration of bypass surgery due to left main coronary  I have independently reviewed the above  cath films and reviewed the findings with the  patient .    Assessment / Plan:      1. CAD-NSTEMI,with critical left main disease  2. Varicose veins- vein mapping ordered, may be able to only take vein from thigh area for adequate conduit 3. Gi- H/O gastric ulcers, GI bleed- ? Concern for possible varices with long standing alcohol abuse--- may need to ensure stable from GI standpoint with need for full anticoagulation 4. Alcohol abuse- will get CMET to ensure, LFTS are WNL and not signs of cirrhosis present 5. CV- HTN, Hyperlipidemia- on Coreg, Lisinopril 6. H/O extensive nicotine abuse- will need PFTS   I have recommended to the patient proceeding with CABG due to recent nstemi, 3 vessels AD including critical left main disease , symptomatic. With alchol and somoking history and age patient is at increased risk post op but  Other options are worse. The goals risks and alternatives of the planned surgical procedure CABG have been  discussed with the patient  And his wife in detail. The risks of the procedure including death, infection, stroke, myocardial infarction, bleeding, blood transfusion have all been discussed specifically.  I have quoted [name] a 4 % of perioperative mortality and a complication rate as high as 50 %. The patient's questions have been answered.PANCHO RUSHING is willing  to proceed with the planned procedure.   Grace Isaac MD      [address] [place] [address] Office [phone]   [place]

## 2017-02-14 NOTE — Interval H&P Note (Signed)
Cath Lab Visit (complete for each Cath Lab visit)  Clinical Evaluation Leading to the Procedure:   ACS: Yes.    Non-ACS:    Anginal Classification: CCS IV  Anti-ischemic medical therapy: Minimal Therapy (1 class of medications)  Non-Invasive Test Results: No non-invasive testing performed  Prior CABG: No previous CABG      History and Physical Interval Note:  02/14/2017 8:15 AM  Vincent Mcclure  has presented today for surgery, with the diagnosis of unstable angina  The various methods of treatment have been discussed with the patient and family. After consideration of risks, benefits and other options for treatment, the patient has consented to  Procedure(s): Left Heart Cath and Coronary Angiography (N/A) as a surgical intervention .  The patient's history has been reviewed, patient examined, no change in status, stable for surgery.  I have reviewed the patient's chart and labs.  Questions were answered to the patient's satisfaction.     Belva Crome III

## 2017-02-14 NOTE — Progress Notes (Signed)
[id number] Discussed with pt and wife the importance of IS and walking after surgery. Pt stated he has bad knees and discussed with him how to get up without straining arms. Discussed sternal precautions. Gave OHS booklet and care guide. Wrote down how to view pre op video. Wife will be available to care for him 24/7 after discharge home. Will follow up after surgery. Graylon Good RN BSN 02/14/2017 1:09 PM

## 2017-02-14 NOTE — Progress Notes (Signed)
[facility] for Heparin Indication: chest pain/ACS  Assessment: 83 yo M presents with CP and an elevated troponin to 2.24 (peak), to continue on IV heparin. Cath planned for planned for later today. Heparin level supra therapeutic (0.82) this morning. Hgb 9.8 stable, Plt wnl, no bleeding reported from RN.   Goal of Therapy:  Heparin level 0.3-0.7 units/ml Monitor platelets by anticoagulation protocol: Yes   Plan:  Reduce heparin gtt to 1100 units/hr Daily heparin level and CBC Monitor for s/sx of bleeding  No Known Allergies  Patient Measurements: Height: '5\' 11"'$  (180.3 cm) Weight: 210 lb 1.6 oz (95.3 kg) IBW/kg (Calculated) : 75.3 Heparin Dosing Weight: 95 kg  Vital Signs: Temp: 97.8 F (36.6 C) (05/07 0520) Temp Source: Oral (05/07 0520) BP: 116/72 (05/07 0520) Pulse Rate: 81 (05/07 0520)  Labs:  Recent Labs  02/11/17 1607  02/12/17 0657  02/12/17 1321 02/12/17 2006 02/13/17 0245 02/13/17 0922 02/13/17 2122 02/14/17 0504  HGB 10.9*  --   --   --  10.5* 9.8* 10.2* 10.3* 9.5* 9.8*  HCT 35.1*  --   --   --  34.8* 30.9* 32.4* 32.8* 30.1* 31.6*  PLT 259  --   --   --   --   --  222  --   --  210  LABPROT  --   --   --   --   --   --   --   --   --  15.2  INR  --   --   --   --   --   --   --   --   --  1.19  HEPARINUNFRC  --   --   --   < > 0.39 0.38 0.57  --   --  0.82*  CREATININE 1.00  --   --   --   --   --   --   --   --  1.18  TROPONINI  --   < > 2.21*  --  2.24*  --   --  1.03*  --   --   < > = values in this interval not displayed.  Estimated Creatinine Clearance: 55.9 mL/min (by C-G formula based on SCr of 1.18 mg/dL).  Georga Bora, PharmD Clinical Pharmacist 02/14/2017 6:22 AM

## 2017-02-14 NOTE — Progress Notes (Signed)
Right Lower Extremity Vein Map  Right Great Saphenous Vein  Segment Diameter Comment  1. Origin 49m   2. High Thigh 754m  3. Mid Thigh 6386m 4. Low Thigh 60m52m5. At Knee 47mm67mnch  6. High Calf 52mm 19m Low Calf 46mm  80mAnkle 43mm   24mft Great Saphenous Vein Segment Diameter Comment  1. Origin 97mm Dua59ms - anterior  Vein mapped  2. High Thigh 70mm   3.58m Thigh 63mm   4. 44mThigh 56mm   5. A26mee 60mm   6. Hi81malf 55mm branch  74mow Calf 35mm   8. Ankl72mmm    mm    R37mSturdivant,Oda Cogan

## 2017-02-14 NOTE — Consult Note (Signed)
           [facility] CM Primary Care Navigator  02/14/2017  Vincent Mcclure Feb 24, 1932 [id number]   Went to see patientat the bedside to identify possible discharge needsbut RN reports that patient is in a procedure (2D Echo) at the moment.  Will attempt to see patient at another time, when available in room.  For questions, please contact:  Dannielle Huh, BSN, RN- [facility] Primary Care Navigator  Telephone: [phone] [name]

## 2017-02-14 NOTE — Progress Notes (Signed)
PROGRESS NOTE    Vincent Mcclure  EML:[id number] DOB: Oct 05, 1932 DOA: 02/11/2017 PCP: [name], DO   Brief Narrative: Vincent Mcclure is a 83 y.o. male with medical history significant of hypertension, hyperlipidemia, GERD, varicose vein in lower extremities, PVD, IBS, gastric ulcer, GI bleeding, alcohol abuse, iron deficiency anemia, who presents with chest pain.  Assessment & Plan:   Principal Problem:   NSTEMI (non-ST elevated myocardial infarction) ([place]) Active Problems:   HLD (hyperlipidemia)   Anemia, iron deficiency   HTN (hypertension)   VARICOSE VEINS, LOWER EXTREMITIES   GERD   Alcohol use/ h/o heavy abuse   Chest pain   NSTEMI:  Admitted for further evaluation.  On IV heparin, cardiology consulted and he underwent cardiac cath today, showing 3 vessel disease. tcts consult for CABG. Currently chest pain free.  Resume aspirin, BB, ace inhibitor, and lipitor.  Echocardiogram pending.     H/o GI bleed:  Gastric ulcers with NSAID use.  Currently hemoglobin is around 10 , which is close to baseline.  Monitor hemoglobin closely.  Resume PPI.    ETOH use:  On CIWA, no withdrawal symptoms.    Hypertension:  Well controlled. Resume coreg.    Hyperlipidemia. Resume statin.     DVT prophylaxis:heparin Code Status: full code.  Family Communication: none at bedside.  Disposition Plan: pending further evaluation. .   Consultants:   Cardiology.   Procedures: echocardiogram.    Antimicrobials: none.    Subjective: Chest pain free.   Objective: Vitals:   02/14/17 1021 02/14/17 1036 02/14/17 1051 02/14/17 1443  BP: 108/69 115/71 91/72 105/64  Pulse: 67 70 (!) 58 66  Resp:    18  Temp:    98.5 F (36.9 C)  TempSrc:    Oral  SpO2: 98% 97% 96% 98%  Weight:      Height:        Intake/Output Summary (Last 24 hours) at 02/14/17 1512 Last data filed at 02/14/17 0500  Gross per 24 hour  Intake              240 ml  Output              360 ml    Net             -120 ml   Filed Weights   02/12/17 0431 02/13/17 0500 02/14/17 0520  Weight: 94.2 kg (207 lb 11.2 oz) 93.6 kg (206 lb 4.8 oz) 95.3 kg (210 lb 1.6 oz)    Examination:  General exam: Appears calm and comfortable  Respiratory system: Clear to auscultation. Respiratory effort normal. Cardiovascular system: S1 & S2 heard, RRR. No JVD, murmurs, rubs, gallops or clicks. No pedal edema. Gastrointestinal system: Abdomen is nondistended, soft and nontender. No organomegaly or masses felt. Normal bowel sounds heard. Central nervous system: Alert and oriented. No focal neurological deficits. Extremities: Symmetric 5 x 5 power. Skin: No rashes, lesions or ulcers Psychiatry: Judgement and insight appear normal. Mood & affect appropriate.     Data Reviewed: I have personally reviewed following labs and imaging studies  CBC:  Recent Labs Lab 02/11/17 1607  02/13/17 0245 02/13/17 0922 02/13/17 2122 02/14/17 0504 02/14/17 0929  WBC 4.9  --  4.4  --   --  4.4  --   HGB 10.9*  < > 10.2* 10.3* 9.5* 9.8* 10.0*  HCT 35.1*  < > 32.4* 32.8* 30.1* 31.6* 30.9*  MCV 73.9*  --  74.8*  --   --  73.8*  --   PLT 259  --  222  --   --  210  --   < > = values in this interval not displayed. Basic Metabolic Panel:  Recent Labs Lab 02/11/17 1607 02/14/17 0504  NA 134* 131*  K 3.9 3.7  CL 99* 99*  CO2 25 23  GLUCOSE 123* 107*  BUN 14 14  CREATININE 1.00 1.18  CALCIUM 9.3 8.9   GFR: Estimated Creatinine Clearance: 55.9 mL/min (by C-G formula based on SCr of 1.18 mg/dL). Liver Function Tests: No results for input(s): AST, ALT, ALKPHOS, BILITOT, PROT, ALBUMIN in the last 168 hours.  Recent Labs Lab 02/11/17 1930  LIPASE 31   No results for input(s): AMMONIA in the last 168 hours. Coagulation Profile:  Recent Labs Lab 02/14/17 0504  INR 1.19   Cardiac Enzymes:  Recent Labs Lab 02/12/17 0008 02/12/17 0657 02/12/17 1321 02/13/17 0922  TROPONINI 1.04* 2.21* 2.24*  1.03*   BNP (last 3 results) No results for input(s): PROBNP in the last 8760 hours. HbA1C:  Recent Labs  02/12/17 0657  HGBA1C 5.9*   CBG:  Recent Labs Lab 02/13/17 0730 02/13/17 1112 02/13/17 1701 02/14/17 0720 02/14/17 1115  GLUCAP 129* 110* 112* 100* 112*   Lipid Profile:  Recent Labs  02/12/17 0657  CHOL 145  HDL 43  LDLCALC 85  TRIG 83  CHOLHDL 3.4   Thyroid Function Tests: No results for input(s): TSH, T4TOTAL, FREET4, T3FREE, THYROIDAB in the last 72 hours. Anemia Panel: No results for input(s): VITAMINB12, FOLATE, FERRITIN, TIBC, IRON, RETICCTPCT in the last 72 hours. Sepsis Labs: No results for input(s): PROCALCITON, LATICACIDVEN in the last 168 hours.  No results found for this or any previous visit (from the past 240 hour(s)).       Radiology Studies: No results found.      Scheduled Meds: . aspirin  81 mg Oral Daily  . atorvastatin  80 mg Oral q1800  . carvedilol  6.25 mg Oral BID WC  . ferrous sulfate  325 mg Oral Q breakfast  . folic acid  1 mg Oral Daily  . hydrochlorothiazide  25 mg Oral Daily  . lisinopril  20 mg Oral Daily  . multivitamin animal shapes (with Ca/FA)  2 tablet Oral Daily  . pantoprazole  40 mg Oral BID  . sodium chloride flush  3 mL Intravenous Q12H  . thiamine  100 mg Oral Daily   Or  . thiamine  100 mg Intravenous Daily   Continuous Infusions: . sodium chloride    . heparin       LOS: 2 days    Time spent: 35 minutes.     Hosie Poisson, MD [facility] Pager [phone]   If 7PM-7AM, please contact night-coverage [email or web address] Password [email or web address] 02/14/2017, 3:12 PM

## 2017-02-14 NOTE — H&P (View-Only) (Signed)
Progress Note  Patient Name: Vincent Mcclure Date of Encounter: 02/13/2017   Subjective   No compliaints  Inpatient Medications    Scheduled Meds: . aspirin  81 mg Oral Daily  . atorvastatin  80 mg Oral q1800  . carvedilol  6.25 mg Oral BID WC  . ferrous sulfate  325 mg Oral Q breakfast  . folic acid  1 mg Oral Daily  . hydrochlorothiazide  25 mg Oral Daily  . lisinopril  20 mg Oral Daily  . multivitamin animal shapes (with Ca/FA)  2 tablet Oral Daily  . pantoprazole  40 mg Oral BID  . thiamine  100 mg Oral Daily   Or  . thiamine  100 mg Intravenous Daily   Continuous Infusions: . heparin 1,300 Units/hr (02/12/17 1654)   PRN Meds: acetaminophen, alum & mag hydroxide-simeth, guaiFENesin, LORazepam **OR** LORazepam, morphine injection, nitroGLYCERIN, ondansetron (ZOFRAN) IV, senna-docusate, traMADol, zolpidem   Vital Signs    Vitals:   02/12/17 0431 02/12/17 0732 02/12/17 2055 02/13/17 0500  BP: (!) 141/80 (!) 150/104 (!) 108/59 133/70  Pulse: 90 92 88 84  Resp: '20 20 18 18  '$ Temp: 98.7 F (37.1 C) 98.1 F (36.7 C) 98.6 F (37 C) 97.6 F (36.4 C)  TempSrc: Oral Oral Oral Oral  SpO2: 98% 97% 98% 97%  Weight: 207 lb 11.2 oz (94.2 kg)   206 lb 4.8 oz (93.6 kg)  Height:        Intake/Output Summary (Last 24 hours) at 02/13/17 0852 Last data filed at 02/13/17 0500  Gross per 24 hour  Intake              946 ml  Output                0 ml  Net              946 ml   Filed Weights   02/11/17 2030 02/12/17 0431 02/13/17 0500  Weight: 210 lb (95.3 kg) 207 lb 11.2 oz (94.2 kg) 206 lb 4.8 oz (93.6 kg)    Telemetry    - Personally Reviewed- SR, occaisonal PACs  ECG     - Personally Reviewed  Physical Exam   GEN: No acute distress.   Neck: No JVD Cardiac: RRR, no murmurs, rubs, or gallops.  Respiratory: Clear to auscultation bilaterally. GI: Soft, nontender, non-distended  MS: No edema; No deformity. Neuro:  Nonfocal  Psych: Normal affect   Labs      Chemistry Recent Labs Lab 02/11/17 1607  NA 134*  K 3.9  CL 99*  CO2 25  GLUCOSE 123*  BUN 14  CREATININE 1.00  CALCIUM 9.3  GFRNONAA >60  GFRAA >60  ANIONGAP 10     Hematology Recent Labs Lab 02/11/17 1607 02/12/17 1321 02/12/17 2006 02/13/17 0245  WBC 4.9  --   --  4.4  RBC 4.75  --   --  4.33  HGB 10.9* 10.5* 9.8* 10.2*  HCT 35.1* 34.8* 30.9* 32.4*  MCV 73.9*  --   --  74.8*  MCH 22.9*  --   --  23.6*  MCHC 31.1  --   --  31.5  RDW 17.0*  --   --  17.3*  PLT 259  --   --  222    Cardiac Enzymes Recent Labs Lab 02/12/17 0008 02/12/17 0657 02/12/17 1321  TROPONINI 1.04* 2.21* 2.24*    Recent Labs Lab 02/11/17 1617  TROPIPOC 0.03     BNPNo  results for input(s): BNP, PROBNP in the last 168 hours.   DDimer No results for input(s): DDIMER in the last 168 hours.   Radiology    Dg Chest 2 View  Result Date: 02/11/2017 CLINICAL DATA:  83 year old presenting with acute onset of chest pain radiating into both arms and into the neck that began approximately 4 hours ago. Current history of hypertension. EXAM: CHEST  2 VIEW COMPARISON:  09/01/2016, 11/13/2014 and earlier. FINDINGS: Cardiac silhouette normal in size, unchanged. Thoracic aorta tortuous and atherosclerotic, unchanged. Linear scarring in the lower lobes and lingula, unchanged. Nodules in the upper lobes, dense for their small size, unchanged dating back to 2016 and therefore indicating calcified granulomata. No new nodules in either lung. Pulmonary vascularity normal. Bronchovascular markings. No pleural effusions. No pneumothorax. Degenerative changes involving the thoracic and upper lumbar spine. IMPRESSION: No acute cardiopulmonary disease. Stable scarring in the lower lobes and lingula. Stable small calcified granulomata in the upper lobes. Electronically Signed   By: Evangeline Dakin M.D.   On: 02/11/2017 16:56    Cardiac Studies     Patient Profile   83 yo male history of HTN, HL, PAD, gastic  ulcers and GI bleed in 2016, Fe deficient anemia, EtoH abuse admitted with chest pain and NSTEMI   Assessment & Plan    1. NSTEMI - trop up to 2.2, awaiting peak.  - echo pending - medical therapy with ASA, atorva 80, coreg 6.'25mg'$  bid, hep gtt, lisionpril - plan for cath tomorrow  2. H/o GIB/PUD: last admit for GIB was in 2016. EGD showed gastritis/ gastric ulcers, in the setting of chronic NSAIDS and ETOH. He denies any recent melena. Hgb is 10.9 on admit. Continue on PPI. Continue to monitor.   3. ETOH Abuse: admits to drinking 6/7 days a week (beer and vodka) on CIWA protocol.   I have reviewed the risks, indications, and alternatives to cardiac catheterization, possible angioplasty, and stenting with the patient today. Risks include but are not limited to bleeding, infection, vascular injury, stroke, myocardial infection, arrhythmia, kidney injury, radiation-related injury in the case of prolonged fluoroscopy use, emergency cardiac surgery, and death. The patient understands the risks of serious complication is 1-2 in 7903 with diagnostic cardiac cath and 1-2% or less with angioplasty/stenting.  Merrily Pew, MD  02/13/2017, 8:52 AM

## 2017-02-14 NOTE — Progress Notes (Signed)
  Echocardiogram 2D Echocardiogram has been performed.  Vincent Mcclure 02/14/2017, 5:15 PM

## 2017-02-14 NOTE — Progress Notes (Signed)
Pre-op Cardiac Surgery  Carotid Findings: Mild plaque with 1-39% stenosis. Antegrade flow in bilateral vertebral arteries.  Upper Extremity Right Left  Brachial Pressures 91 - Triphasic 95 - Triphasic  Radial Waveforms Triphasic Triphasic  Ulnar Waveforms Triphasic Triphasic  Palmar Arch (Allen's Test) WNL WNL   Findings:   Doppler waveforms remain within normal limits with bilateral radial and ulnar compressions.    Lower  Extremity Right Left  Dorsalis Pedis 66 - Biphasic 112 - Triphasic  Posterior Tibial 67 - Biphasic 114 - Triphasic  Ankle/Brachial Indices 0.71 1.2    Findings:  Right sided ABI - moderate arterial disease at rest. Left sided ABI normal at rest. Oda Cogan, BS, RDMS, RVT

## 2017-02-15 ENCOUNTER — Inpatient Hospital Stay (HOSPITAL_COMMUNITY): Payer: Medicare HMO | Admitting: Certified Registered Nurse Anesthetist

## 2017-02-15 ENCOUNTER — Encounter (HOSPITAL_COMMUNITY)
Admission: EM | Disposition: A | Discharge status: Home / Self Care | Payer: Self-pay | Source: Home / Self Care | Attending: Cardiothoracic Surgery

## 2017-02-15 ENCOUNTER — Encounter (HOSPITAL_COMMUNITY): Payer: Self-pay | Admitting: Certified Registered Nurse Anesthetist

## 2017-02-15 ENCOUNTER — Inpatient Hospital Stay (HOSPITAL_COMMUNITY): Payer: Medicare HMO

## 2017-02-15 DIAGNOSIS — Z951 Presence of aortocoronary bypass graft: Secondary | ICD-10-CM

## 2017-02-15 HISTORY — PX: CORONARY ARTERY BYPASS GRAFT: SHX141

## 2017-02-15 HISTORY — PX: TEE WITHOUT CARDIOVERSION: SHX5443

## 2017-02-15 LAB — POCT I-STAT, CHEM 8
BUN: 9 mg/dL (ref 6–20)
BUN: 10 mg/dL (ref 6–20)
BUN: 8 mg/dL (ref 6–20)
BUN: 10 mg/dL (ref 6–20)
BUN: 10 mg/dL (ref 6–20)
BUN: 12 mg/dL (ref 6–20)
CALCIUM ION: 1.15 mmol/L (ref 1.15–1.40)
CHLORIDE: 100 mmol/L — AB (ref 101–111)
CHLORIDE: 96 mmol/L — AB (ref 101–111)
CHLORIDE: 97 mmol/L — AB (ref 101–111)
CREATININE: 0.9 mg/dL (ref 0.61–1.24)
Calcium, Ion: 1.22 mmol/L (ref 1.15–1.40)
Calcium, Ion: 1.24 mmol/L (ref 1.15–1.40)
Calcium, Ion: 1 mmol/L — ABNORMAL LOW (ref 1.15–1.40)
Calcium, Ion: 1.13 mmol/L — ABNORMAL LOW (ref 1.15–1.40)
Calcium, Ion: 1.2 mmol/L (ref 1.15–1.40)
Chloride: 98 mmol/L — ABNORMAL LOW (ref 101–111)
Chloride: 96 mmol/L — ABNORMAL LOW (ref 101–111)
Chloride: 101 mmol/L (ref 101–111)
Creatinine, Ser: 0.7 mg/dL (ref 0.61–1.24)
Creatinine, Ser: 0.7 mg/dL (ref 0.61–1.24)
Creatinine, Ser: 0.5 mg/dL — ABNORMAL LOW (ref 0.61–1.24)
Creatinine, Ser: 0.7 mg/dL (ref 0.61–1.24)
Creatinine, Ser: 0.7 mg/dL (ref 0.61–1.24)
GLUCOSE: 106 mg/dL — AB (ref 65–99)
GLUCOSE: 132 mg/dL — AB (ref 65–99)
GLUCOSE: 178 mg/dL — AB (ref 65–99)
Glucose, Bld: 104 mg/dL — ABNORMAL HIGH (ref 65–99)
Glucose, Bld: 124 mg/dL — ABNORMAL HIGH (ref 65–99)
Glucose, Bld: 155 mg/dL — ABNORMAL HIGH (ref 65–99)
HCT: 26 % — ABNORMAL LOW (ref 39.0–52.0)
HCT: 29 % — ABNORMAL LOW (ref 39.0–52.0)
HEMATOCRIT: 26 % — AB (ref 39.0–52.0)
HEMATOCRIT: 21 % — AB (ref 39.0–52.0)
HEMATOCRIT: 23 % — AB (ref 39.0–52.0)
HEMATOCRIT: 29 % — AB (ref 39.0–52.0)
HEMOGLOBIN: 7.1 g/dL — AB (ref 13.0–17.0)
HEMOGLOBIN: 7.8 g/dL — AB (ref 13.0–17.0)
HEMOGLOBIN: 9.9 g/dL — AB (ref 13.0–17.0)
Hemoglobin: 8.8 g/dL — ABNORMAL LOW (ref 13.0–17.0)
Hemoglobin: 8.8 g/dL — ABNORMAL LOW (ref 13.0–17.0)
Hemoglobin: 9.9 g/dL — ABNORMAL LOW (ref 13.0–17.0)
POTASSIUM: 4.2 mmol/L (ref 3.5–5.1)
POTASSIUM: 4.3 mmol/L (ref 3.5–5.1)
POTASSIUM: 3.7 mmol/L (ref 3.5–5.1)
Potassium: 3.5 mmol/L (ref 3.5–5.1)
Potassium: 3.7 mmol/L (ref 3.5–5.1)
Potassium: 3.7 mmol/L (ref 3.5–5.1)
SODIUM: 133 mmol/L — AB (ref 135–145)
Sodium: 134 mmol/L — ABNORMAL LOW (ref 135–145)
Sodium: 133 mmol/L — ABNORMAL LOW (ref 135–145)
Sodium: 133 mmol/L — ABNORMAL LOW (ref 135–145)
Sodium: 131 mmol/L — ABNORMAL LOW (ref 135–145)
Sodium: 132 mmol/L — ABNORMAL LOW (ref 135–145)
TCO2: 25 mmol/L (ref 0–100)
TCO2: 25 mmol/L (ref 0–100)
TCO2: 27 mmol/L (ref 0–100)
TCO2: 26 mmol/L (ref 0–100)
TCO2: 25 mmol/L (ref 0–100)
TCO2: 21 mmol/L (ref 0–100)

## 2017-02-15 LAB — POCT I-STAT 3, ART BLOOD GAS (G3+)
ACID-BASE DEFICIT: 1 mmol/L (ref 0.0–2.0)
ACID-BASE DEFICIT: 4 mmol/L — AB (ref 0.0–2.0)
ACID-BASE DEFICIT: 6 mmol/L — AB (ref 0.0–2.0)
BICARBONATE: 25.3 mmol/L (ref 20.0–28.0)
BICARBONATE: 20.9 mmol/L (ref 20.0–28.0)
BICARBONATE: 19.4 mmol/L — AB (ref 20.0–28.0)
Bicarbonate: 22.8 mmol/L (ref 20.0–28.0)
O2 SAT: 97 %
O2 SAT: 94 %
O2 Saturation: 100 %
O2 Saturation: 97 %
PCO2 ART: 32.4 mmHg (ref 32.0–48.0)
PH ART: 7.392 (ref 7.350–7.450)
PO2 ART: 73 mmHg — AB (ref 83.0–108.0)
TCO2: 27 mmol/L (ref 0–100)
TCO2: 24 mmol/L (ref 0–100)
TCO2: 22 mmol/L (ref 0–100)
TCO2: 21 mmol/L (ref 0–100)
pCO2 arterial: 41.6 mmHg (ref 32.0–48.0)
pCO2 arterial: 36.7 mmHg (ref 32.0–48.0)
pCO2 arterial: 36.5 mmHg (ref 32.0–48.0)
pH, Arterial: 7.455 — ABNORMAL HIGH (ref 7.350–7.450)
pH, Arterial: 7.365 (ref 7.350–7.450)
pH, Arterial: 7.333 — ABNORMAL LOW (ref 7.350–7.450)
pO2, Arterial: 482 mmHg — ABNORMAL HIGH (ref 83.0–108.0)
pO2, Arterial: 87 mmHg (ref 83.0–108.0)
pO2, Arterial: 91 mmHg (ref 83.0–108.0)

## 2017-02-15 LAB — CBC
HCT: 30.8 % — ABNORMAL LOW (ref 39.0–52.0)
HEMATOCRIT: 32.7 % — AB (ref 39.0–52.0)
HEMATOCRIT: 30.2 % — AB (ref 39.0–52.0)
HEMOGLOBIN: 10 g/dL — AB (ref 13.0–17.0)
Hemoglobin: 9.8 g/dL — ABNORMAL LOW (ref 13.0–17.0)
Hemoglobin: 9.5 g/dL — ABNORMAL LOW (ref 13.0–17.0)
MCH: 22.7 pg — AB (ref 26.0–34.0)
MCH: 24 pg — ABNORMAL LOW (ref 26.0–34.0)
MCH: 22.9 pg — ABNORMAL LOW (ref 26.0–34.0)
MCHC: 30.6 g/dL (ref 30.0–36.0)
MCHC: 32.5 g/dL (ref 30.0–36.0)
MCHC: 30.8 g/dL (ref 30.0–36.0)
MCV: 74.1 fL — ABNORMAL LOW (ref 78.0–100.0)
MCV: 73.8 fL — AB (ref 78.0–100.0)
MCV: 74.2 fL — ABNORMAL LOW (ref 78.0–100.0)
Platelets: 214 10*3/uL (ref 150–400)
Platelets: 148 10*3/uL — ABNORMAL LOW (ref 150–400)
Platelets: 168 10*3/uL (ref 150–400)
RBC: 4.41 MIL/uL (ref 4.22–5.81)
RBC: 4.09 MIL/uL — ABNORMAL LOW (ref 4.22–5.81)
RBC: 4.15 MIL/uL — ABNORMAL LOW (ref 4.22–5.81)
RDW: 16.6 % — ABNORMAL HIGH (ref 11.5–15.5)
RDW: 16.9 % — AB (ref 11.5–15.5)
RDW: 16.7 % — ABNORMAL HIGH (ref 11.5–15.5)
WBC: 3.9 10*3/uL — ABNORMAL LOW (ref 4.0–10.5)
WBC: 6.5 10*3/uL (ref 4.0–10.5)
WBC: 8.8 10*3/uL (ref 4.0–10.5)

## 2017-02-15 LAB — BASIC METABOLIC PANEL
Anion gap: 9 (ref 5–15)
BUN: 10 mg/dL (ref 6–20)
CO2: 24 mmol/L (ref 22–32)
Calcium: 9 mg/dL (ref 8.9–10.3)
Chloride: 98 mmol/L — ABNORMAL LOW (ref 101–111)
Creatinine, Ser: 1.03 mg/dL (ref 0.61–1.24)
GFR calc Af Amer: >60 mL/min (ref >60)
GFR calc non Af Amer: >60 mL/min (ref >60)
Glucose, Bld: 105 mg/dL — ABNORMAL HIGH (ref 65–99)
Potassium: 3.7 mmol/L (ref 3.5–5.1)
Sodium: 131 mmol/L — ABNORMAL LOW (ref 135–145)

## 2017-02-15 LAB — POCT I-STAT 4, (NA,K, GLUC, HGB,HCT)
GLUCOSE: 148 mg/dL — AB (ref 65–99)
HEMATOCRIT: 29 % — AB (ref 39.0–52.0)
Hemoglobin: 9.9 g/dL — ABNORMAL LOW (ref 13.0–17.0)
POTASSIUM: 3.4 mmol/L — AB (ref 3.5–5.1)
Sodium: 135 mmol/L (ref 135–145)

## 2017-02-15 LAB — HEPARIN LEVEL (UNFRACTIONATED)
HEPARIN UNFRACTIONATED: 0.3 [IU]/mL (ref 0.30–0.70)
Heparin Unfractionated: 0.44 IU/mL (ref 0.30–0.70)

## 2017-02-15 LAB — CREATININE, SERUM
Creatinine, Ser: 1.09 mg/dL (ref 0.61–1.24)
GFR calc Af Amer: >60 mL/min (ref >60)
GFR calc non Af Amer: >60 mL/min (ref >60)

## 2017-02-15 LAB — HEMOGLOBIN A1C
Hgb A1c MFr Bld: 5.9 % — ABNORMAL HIGH (ref 4.8–5.6)
Mean Plasma Glucose: 123 mg/dL

## 2017-02-15 LAB — HEMOGLOBIN AND HEMATOCRIT, BLOOD
HEMATOCRIT: 22.1 % — AB (ref 39.0–52.0)
HEMOGLOBIN: 7 g/dL — AB (ref 13.0–17.0)

## 2017-02-15 LAB — GLUCOSE, CAPILLARY
GLUCOSE-CAPILLARY: 116 mg/dL — AB (ref 65–99)
GLUCOSE-CAPILLARY: 129 mg/dL — AB (ref 65–99)
Glucose-Capillary: 138 mg/dL — ABNORMAL HIGH (ref 65–99)
Glucose-Capillary: 113 mg/dL — ABNORMAL HIGH (ref 65–99)

## 2017-02-15 LAB — BLOOD GAS, ARTERIAL
Acid-base deficit: 1.4 mmol/L (ref 0.0–2.0)
BICARBONATE: 22.7 mmol/L (ref 20.0–28.0)
Drawn by: 24487
FIO2: 21
O2 Saturation: 95.2 %
PATIENT TEMPERATURE: 98.6
pCO2 arterial: 37.7 mmHg (ref 32.0–48.0)
pH, Arterial: 7.397 (ref 7.350–7.450)
pO2, Arterial: 80.2 mmHg — ABNORMAL LOW (ref 83.0–108.0)

## 2017-02-15 LAB — PROTIME-INR
INR: 1.36
Prothrombin Time: 16.9 seconds — ABNORMAL HIGH (ref 11.4–15.2)

## 2017-02-15 LAB — PLATELET COUNT: Platelets: 130 10*3/uL — ABNORMAL LOW (ref 150–400)

## 2017-02-15 LAB — PREPARE RBC (CROSSMATCH)

## 2017-02-15 LAB — MAGNESIUM: Magnesium: 2.7 mg/dL — ABNORMAL HIGH (ref 1.7–2.4)

## 2017-02-15 LAB — SURGICAL PCR SCREEN
MRSA, PCR: NEGATIVE
STAPHYLOCOCCUS AUREUS: NEGATIVE

## 2017-02-15 LAB — APTT: APTT: 31 s (ref 24–36)

## 2017-02-15 SURGERY — CORONARY ARTERY BYPASS GRAFTING (CABG)
Anesthesia: General | Site: Chest

## 2017-02-15 MED ORDER — HEMOSTATIC AGENTS (NO CHARGE) OPTIME
TOPICAL | Status: DC | PRN
  Administered 2017-02-15: 1 via TOPICAL

## 2017-02-15 MED ORDER — OXYCODONE HCL 5 MG PO TABS
5.0000 mg | ORAL_TABLET | ORAL | Status: DC | PRN
  Administered 2017-02-15 – 2017-02-16 (×3): 10 mg via ORAL
  Filled 2017-02-15 (×3): qty 2

## 2017-02-15 MED ORDER — METOPROLOL TARTRATE 12.5 MG HALF TABLET
12.5000 mg | ORAL_TABLET | Freq: Two times a day (BID) | ORAL | Status: DC
  Administered 2017-02-17: 12.5 mg via ORAL
  Filled 2017-02-15 (×5): qty 1

## 2017-02-15 MED ORDER — ACETAMINOPHEN 160 MG/5ML PO SOLN: 650.0000 mg | Freq: Once | ORAL | Status: AC

## 2017-02-15 MED ORDER — CHLORHEXIDINE GLUCONATE 0.12 % MT SOLN
15.0000 mL | OROMUCOSAL | Status: AC
  Administered 2017-02-15: 15 mL via OROMUCOSAL

## 2017-02-15 MED ORDER — PROTAMINE SULFATE 10 MG/ML IV SOLN
INTRAVENOUS | Status: AC
  Filled 2017-02-15: qty 10

## 2017-02-15 MED ORDER — CHLORHEXIDINE GLUCONATE 0.12 % MT SOLN
15.0000 mL | Freq: Two times a day (BID) | OROMUCOSAL | Status: DC
Start: 2017-02-15 — End: 2017-02-15
  Administered 2017-02-15: 15 mL via OROMUCOSAL

## 2017-02-15 MED ORDER — POTASSIUM CHLORIDE 10 MEQ/50ML IV SOLN
10.0000 meq | INTRAVENOUS | Status: AC
  Administered 2017-02-15 (×3): 10 meq via INTRAVENOUS
  Filled 2017-02-15 (×3): qty 50

## 2017-02-15 MED ORDER — FAMOTIDINE IN NACL 20-0.9 MG/50ML-% IV SOLN
20.0000 mg | Freq: Two times a day (BID) | INTRAVENOUS | Status: AC
  Administered 2017-02-15: 20 mg via INTRAVENOUS

## 2017-02-15 MED ORDER — PHENYLEPHRINE HCL 10 MG/ML IJ SOLN
INTRAMUSCULAR | Status: DC | PRN
  Administered 2017-02-15: 20 ug/min via INTRAVENOUS

## 2017-02-15 MED ORDER — FENTANYL CITRATE (PF) 250 MCG/5ML IJ SOLN
INTRAMUSCULAR | Status: AC
  Filled 2017-02-15: qty 25

## 2017-02-15 MED ORDER — 0.9 % SODIUM CHLORIDE (POUR BTL) OPTIME
TOPICAL | Status: DC | PRN
  Administered 2017-02-15: 5000 mL

## 2017-02-15 MED ORDER — ALBUMIN HUMAN 5 % IV SOLN
250.0000 mL | INTRAVENOUS | Status: AC | PRN
  Administered 2017-02-15 – 2017-02-16 (×5): 250 mL via INTRAVENOUS
  Filled 2017-02-15 (×3): qty 250

## 2017-02-15 MED ORDER — ARTIFICIAL TEARS OPHTHALMIC OINT
TOPICAL_OINTMENT | OPHTHALMIC | Status: AC
  Filled 2017-02-15: qty 3.5

## 2017-02-15 MED ORDER — HEPARIN SODIUM (PORCINE) 1000 UNIT/ML IJ SOLN
INTRAMUSCULAR | Status: AC
  Filled 2017-02-15: qty 1

## 2017-02-15 MED ORDER — SODIUM CHLORIDE 0.9 % IV SOLN: 10.0000 mL/h | Freq: Once | INTRAVENOUS | Status: DC

## 2017-02-15 MED ORDER — ACETAMINOPHEN 650 MG RE SUPP
650.0000 mg | Freq: Once | RECTAL | Status: AC
  Administered 2017-02-15: 650 mg via RECTAL

## 2017-02-15 MED ORDER — SODIUM CHLORIDE 0.9 % IV SOLN
0.0000 ug/kg/h | INTRAVENOUS | Status: DC
  Administered 2017-02-15: 0.7 ug/kg/h via INTRAVENOUS
  Filled 2017-02-15 (×2): qty 2

## 2017-02-15 MED ORDER — DOPAMINE-DEXTROSE 3.2-5 MG/ML-% IV SOLN
INTRAVENOUS | Status: DC | PRN
  Administered 2017-02-15: 3 ug/kg/min via INTRAVENOUS

## 2017-02-15 MED ORDER — LACTATED RINGERS IV SOLN
INTRAVENOUS | Status: DC | PRN
  Administered 2017-02-15: 07:00:00 via INTRAVENOUS

## 2017-02-15 MED ORDER — ORAL CARE MOUTH RINSE
15.0000 mL | Freq: Two times a day (BID) | OROMUCOSAL | Status: DC
Start: 2017-02-15 — End: 2017-02-15
  Administered 2017-02-15 (×2): 15 mL via OROMUCOSAL

## 2017-02-15 MED ORDER — LACTATED RINGERS IV SOLN: INTRAVENOUS | Status: DC

## 2017-02-15 MED ORDER — DEXTROSE 5 % IV SOLN
INTRAVENOUS | Status: DC | PRN
  Administered 2017-02-15: .75 g via INTRAVENOUS
  Administered 2017-02-15: 1.5 g via INTRAVENOUS

## 2017-02-15 MED ORDER — ONDANSETRON HCL 4 MG/2ML IJ SOLN
4.0000 mg | Freq: Four times a day (QID) | INTRAMUSCULAR | Status: DC | PRN
  Administered 2017-02-16 – 2017-02-20 (×9): 4 mg via INTRAVENOUS
  Filled 2017-02-15 (×9): qty 2

## 2017-02-15 MED ORDER — PHENYLEPHRINE 40 MCG/ML (10ML) SYRINGE FOR IV PUSH (FOR BLOOD PRESSURE SUPPORT)
PREFILLED_SYRINGE | INTRAVENOUS | Status: AC
  Filled 2017-02-15: qty 20

## 2017-02-15 MED ORDER — VANCOMYCIN HCL IN DEXTROSE 1-5 GM/200ML-% IV SOLN
1000.0000 mg | Freq: Once | INTRAVENOUS | Status: AC
  Administered 2017-02-15: 1000 mg via INTRAVENOUS
  Filled 2017-02-15: qty 200

## 2017-02-15 MED ORDER — MORPHINE SULFATE (PF) 4 MG/ML IV SOLN
2.0000 mg | INTRAVENOUS | Status: DC | PRN
  Administered 2017-02-15 (×2): 2 mg via INTRAVENOUS
  Administered 2017-02-15: 4 mg via INTRAVENOUS
  Administered 2017-02-15: 3 mg via INTRAVENOUS
  Administered 2017-02-16 (×3): 4 mg via INTRAVENOUS
  Filled 2017-02-15 (×6): qty 1

## 2017-02-15 MED ORDER — EPHEDRINE SULFATE 50 MG/ML IJ SOLN
INTRAMUSCULAR | Status: DC | PRN
  Administered 2017-02-15: 5 mg via INTRAVENOUS

## 2017-02-15 MED ORDER — METOPROLOL TARTRATE 25 MG/10 ML ORAL SUSPENSION: 12.5000 mg | Freq: Two times a day (BID) | ORAL | Status: DC

## 2017-02-15 MED ORDER — BISACODYL 5 MG PO TBEC
10.0000 mg | DELAYED_RELEASE_TABLET | Freq: Every day | ORAL | Status: DC
  Administered 2017-02-16 – 2017-02-25 (×10): 10 mg via ORAL
  Filled 2017-02-15 (×10): qty 2

## 2017-02-15 MED ORDER — LIDOCAINE 2% (20 MG/ML) 5 ML SYRINGE
INTRAMUSCULAR | Status: AC
  Filled 2017-02-15: qty 5

## 2017-02-15 MED ORDER — PROTAMINE SULFATE 10 MG/ML IV SOLN
INTRAVENOUS | Status: AC
  Filled 2017-02-15: qty 5

## 2017-02-15 MED ORDER — ASPIRIN 81 MG PO CHEW: 324.0000 mg | CHEWABLE_TABLET | Freq: Every day | ORAL | Status: DC

## 2017-02-15 MED ORDER — ACETAMINOPHEN 500 MG PO TABS
1000.0000 mg | ORAL_TABLET | Freq: Four times a day (QID) | ORAL | Status: AC
  Administered 2017-02-16 – 2017-02-20 (×15): 1000 mg via ORAL
  Filled 2017-02-15 (×17): qty 2

## 2017-02-15 MED ORDER — HEMOSTATIC AGENTS (NO CHARGE) OPTIME
TOPICAL | Status: DC | PRN
  Administered 2017-02-15: 3 via TOPICAL

## 2017-02-15 MED ORDER — SODIUM CHLORIDE 0.9 % IV SOLN
0.0000 ug/min | INTRAVENOUS | Status: DC
  Administered 2017-02-15: 20 ug/min via INTRAVENOUS
  Administered 2017-02-18: 10 ug/min via INTRAVENOUS
  Filled 2017-02-15 (×3): qty 2

## 2017-02-15 MED ORDER — ASPIRIN EC 325 MG PO TBEC
325.0000 mg | DELAYED_RELEASE_TABLET | Freq: Every day | ORAL | Status: DC
  Administered 2017-02-16: 325 mg via ORAL
  Filled 2017-02-15: qty 1

## 2017-02-15 MED ORDER — SODIUM CHLORIDE 0.9% FLUSH: 3.0000 mL | INTRAVENOUS | Status: DC | PRN

## 2017-02-15 MED ORDER — PROTAMINE SULFATE 10 MG/ML IV SOLN
INTRAVENOUS | Status: AC
  Filled 2017-02-15: qty 25

## 2017-02-15 MED ORDER — LACTATED RINGERS IV SOLN
INTRAVENOUS | Status: DC | PRN
  Administered 2017-02-15 (×2): via INTRAVENOUS

## 2017-02-15 MED ORDER — PROPOFOL 10 MG/ML IV BOLUS
INTRAVENOUS | Status: AC
  Filled 2017-02-15: qty 40

## 2017-02-15 MED ORDER — FUROSEMIDE 10 MG/ML IJ SOLN: 40.0000 mg | INTRAMUSCULAR | Status: DC

## 2017-02-15 MED ORDER — ORAL CARE MOUTH RINSE
15.0000 mL | Freq: Two times a day (BID) | OROMUCOSAL | Status: DC
  Administered 2017-02-16: 15 mL via OROMUCOSAL

## 2017-02-15 MED ORDER — DEXMEDETOMIDINE HCL IN NACL 400 MCG/100ML IV SOLN
INTRAVENOUS | Status: DC | PRN
  Administered 2017-02-15: .3 ug/kg/h via INTRAVENOUS

## 2017-02-15 MED ORDER — FENTANYL CITRATE (PF) 100 MCG/2ML IJ SOLN
INTRAMUSCULAR | Status: DC | PRN
  Administered 2017-02-15: 50 ug via INTRAVENOUS
  Administered 2017-02-15 (×2): 100 ug via INTRAVENOUS
  Administered 2017-02-15: 50 ug via INTRAVENOUS
  Administered 2017-02-15: 500 ug via INTRAVENOUS
  Administered 2017-02-15: 50 ug via INTRAVENOUS

## 2017-02-15 MED ORDER — MIDAZOLAM HCL 2 MG/2ML IJ SOLN: 2.0000 mg | INTRAMUSCULAR | Status: DC | PRN

## 2017-02-15 MED ORDER — SODIUM CHLORIDE 0.9 % IV SOLN
1.0000 g | Freq: Once | INTRAVENOUS | Status: DC
  Filled 2017-02-15: qty 10

## 2017-02-15 MED ORDER — ATORVASTATIN CALCIUM 80 MG PO TABS
80.0000 mg | ORAL_TABLET | Freq: Every day | ORAL | Status: DC
  Administered 2017-02-17 – 2017-02-20 (×4): 80 mg via ORAL
  Filled 2017-02-15 (×4): qty 1

## 2017-02-15 MED ORDER — PHENYLEPHRINE HCL 10 MG/ML IJ SOLN
INTRAVENOUS | Status: DC | PRN
  Administered 2017-02-15: 10 ug/min via INTRAVENOUS

## 2017-02-15 MED ORDER — SODIUM CHLORIDE 0.45 % IV SOLN
INTRAVENOUS | Status: DC | PRN
  Administered 2017-02-15: 14:00:00 via INTRAVENOUS

## 2017-02-15 MED ORDER — PHENYLEPHRINE HCL 10 MG/ML IJ SOLN
INTRAMUSCULAR | Status: DC | PRN
  Administered 2017-02-15 (×2): 40 ug via INTRAVENOUS
  Administered 2017-02-15: 80 ug via INTRAVENOUS
  Administered 2017-02-15: 120 ug via INTRAVENOUS
  Administered 2017-02-15: 40 ug via INTRAVENOUS
  Administered 2017-02-15: 80 ug via INTRAVENOUS
  Administered 2017-02-15: 40 ug via INTRAVENOUS

## 2017-02-15 MED ORDER — MAGNESIUM SULFATE 4 GM/100ML IV SOLN
4.0000 g | Freq: Once | INTRAVENOUS | Status: AC
  Administered 2017-02-15 (×2): 4 g via INTRAVENOUS
  Filled 2017-02-15: qty 100

## 2017-02-15 MED ORDER — SODIUM CHLORIDE 0.9 % IV SOLN: INTRAVENOUS | Status: DC

## 2017-02-15 MED ORDER — ROCURONIUM BROMIDE 100 MG/10ML IV SOLN
INTRAVENOUS | Status: DC | PRN
  Administered 2017-02-15 (×2): 50 mg via INTRAVENOUS
  Administered 2017-02-15: 30 mg via INTRAVENOUS
  Administered 2017-02-15: 20 mg via INTRAVENOUS
  Administered 2017-02-15: 50 mg via INTRAVENOUS

## 2017-02-15 MED ORDER — CALCIUM CHLORIDE 10 % IV SOLN
1.0000 g | Freq: Once | INTRAVENOUS | Status: AC
  Administered 2017-02-15: 1 g via INTRAVENOUS

## 2017-02-15 MED ORDER — LACTATED RINGERS IV SOLN: 500.0000 mL | Freq: Once | INTRAVENOUS | Status: DC | PRN

## 2017-02-15 MED ORDER — MIDAZOLAM HCL 5 MG/5ML IJ SOLN
INTRAMUSCULAR | Status: DC | PRN
  Administered 2017-02-15 (×2): .5 mg via INTRAVENOUS
  Administered 2017-02-15: 2 mg via INTRAVENOUS
  Administered 2017-02-15: 1 mg via INTRAVENOUS
  Administered 2017-02-15: 2 mg via INTRAVENOUS

## 2017-02-15 MED ORDER — LACTATED RINGERS IV SOLN
INTRAVENOUS | Status: DC
  Administered 2017-02-15 (×2): via INTRAVENOUS

## 2017-02-15 MED ORDER — SODIUM CHLORIDE 0.9 % IV SOLN
INTRAVENOUS | Status: DC | PRN
  Administered 2017-02-15: 1000 mg via INTRAVENOUS

## 2017-02-15 MED ORDER — PLASMA-LYTE 148 IV SOLN
INTRAVENOUS | Status: DC | PRN
  Administered 2017-02-15: 500 mL

## 2017-02-15 MED ORDER — SODIUM CHLORIDE 0.9 % IV SOLN: 250.0000 mL | INTRAVENOUS | Status: DC

## 2017-02-15 MED ORDER — ARTIFICIAL TEARS OPHTHALMIC OINT
TOPICAL_OINTMENT | OPHTHALMIC | Status: DC | PRN
  Administered 2017-02-15: 20 via OPHTHALMIC
  Administered 2017-02-15: 1 via OPHTHALMIC

## 2017-02-15 MED ORDER — SODIUM CHLORIDE 0.9% FLUSH
3.0000 mL | Freq: Two times a day (BID) | INTRAVENOUS | Status: DC
  Administered 2017-02-16 – 2017-02-23 (×9): 3 mL via INTRAVENOUS

## 2017-02-15 MED ORDER — INSULIN ASPART 100 UNIT/ML ~~LOC~~ SOLN
0.0000 [IU] | SUBCUTANEOUS | Status: DC
  Administered 2017-02-15: 5 [IU] via SUBCUTANEOUS

## 2017-02-15 MED ORDER — SODIUM CHLORIDE 0.9 % IV SOLN
INTRAVENOUS | Status: DC
  Filled 2017-02-15: qty 1

## 2017-02-15 MED ORDER — PROTAMINE SULFATE 10 MG/ML IV SOLN
INTRAVENOUS | Status: DC | PRN
  Administered 2017-02-15: 350 mg via INTRAVENOUS

## 2017-02-15 MED ORDER — DEXTROSE 5 % IV SOLN
1.5000 g | Freq: Two times a day (BID) | INTRAVENOUS | Status: AC
  Administered 2017-02-15 – 2017-02-17 (×4): 1.5 g via INTRAVENOUS
  Filled 2017-02-15 (×4): qty 1.5

## 2017-02-15 MED ORDER — ACETAMINOPHEN 160 MG/5ML PO SOLN: 1000.0000 mg | Freq: Four times a day (QID) | ORAL | Status: DC

## 2017-02-15 MED ORDER — BISACODYL 10 MG RE SUPP: 10.0000 mg | Freq: Every day | RECTAL | Status: DC

## 2017-02-15 MED ORDER — HEPARIN SODIUM (PORCINE) 1000 UNIT/ML IJ SOLN
INTRAMUSCULAR | Status: DC | PRN
  Administered 2017-02-15: 35000 [IU] via INTRAVENOUS

## 2017-02-15 MED ORDER — SODIUM CHLORIDE 0.9 % IV SOLN
INTRAVENOUS | Status: DC | PRN
  Administered 2017-02-15: 11:00:00 via INTRAVENOUS

## 2017-02-15 MED ORDER — TRAMADOL HCL 50 MG PO TABS
50.0000 mg | ORAL_TABLET | ORAL | Status: DC | PRN
  Administered 2017-02-16: 100 mg via ORAL
  Filled 2017-02-15: qty 2

## 2017-02-15 MED ORDER — LACTATED RINGERS IV SOLN
INTRAVENOUS | Status: DC | PRN
  Administered 2017-02-15 (×2): via INTRAVENOUS

## 2017-02-15 MED ORDER — PANTOPRAZOLE SODIUM 40 MG PO TBEC
40.0000 mg | DELAYED_RELEASE_TABLET | Freq: Every day | ORAL | Status: DC
  Administered 2017-02-17 – 2017-02-20 (×4): 40 mg via ORAL
  Filled 2017-02-15 (×4): qty 1

## 2017-02-15 MED ORDER — ROCURONIUM BROMIDE 10 MG/ML (PF) SYRINGE
PREFILLED_SYRINGE | INTRAVENOUS | Status: AC
  Filled 2017-02-15: qty 10

## 2017-02-15 MED ORDER — LIDOCAINE HCL (CARDIAC) 20 MG/ML IV SOLN
INTRAVENOUS | Status: DC | PRN
  Administered 2017-02-15: 100 mg via INTRAVENOUS

## 2017-02-15 MED ORDER — INSULIN REGULAR HUMAN 100 UNIT/ML IJ SOLN
INTRAMUSCULAR | Status: DC | PRN
  Administered 2017-02-15: .9 [IU]/h via INTRAVENOUS

## 2017-02-15 MED ORDER — MIDAZOLAM HCL 10 MG/2ML IJ SOLN
INTRAMUSCULAR | Status: AC
  Filled 2017-02-15: qty 2

## 2017-02-15 MED ORDER — PROPOFOL 10 MG/ML IV BOLUS
INTRAVENOUS | Status: DC | PRN
Start: 2017-02-15 — End: 2017-02-15
  Administered 2017-02-15 (×3): 20 mg via INTRAVENOUS

## 2017-02-15 MED ORDER — NITROGLYCERIN IN D5W 200-5 MCG/ML-% IV SOLN: 0.0000 ug/min | INTRAVENOUS | Status: DC

## 2017-02-15 MED ORDER — DOCUSATE SODIUM 100 MG PO CAPS
200.0000 mg | ORAL_CAPSULE | Freq: Every day | ORAL | Status: DC
  Administered 2017-02-16 – 2017-02-25 (×10): 200 mg via ORAL
  Filled 2017-02-15 (×10): qty 2

## 2017-02-15 MED ORDER — EPHEDRINE 5 MG/ML INJ
INTRAVENOUS | Status: AC
  Filled 2017-02-15: qty 30

## 2017-02-15 MED ORDER — MORPHINE SULFATE (PF) 4 MG/ML IV SOLN
1.0000 mg | INTRAVENOUS | Status: DC | PRN
  Filled 2017-02-15: qty 1

## 2017-02-15 MED ORDER — DOPAMINE-DEXTROSE 3.2-5 MG/ML-% IV SOLN
3.0000 ug/kg/min | INTRAVENOUS | Status: DC
  Administered 2017-02-15: 3 ug/kg/min via INTRAVENOUS

## 2017-02-15 MED ORDER — INSULIN REGULAR BOLUS VIA INFUSION
0.0000 [IU] | Freq: Three times a day (TID) | INTRAVENOUS | Status: DC
  Filled 2017-02-15: qty 10

## 2017-02-15 MED ORDER — METOPROLOL TARTRATE 5 MG/5ML IV SOLN: 2.5000 mg | INTRAVENOUS | Status: DC | PRN

## 2017-02-15 MED ORDER — POTASSIUM CHLORIDE 10 MEQ/50ML IV SOLN
10.0000 meq | INTRAVENOUS | Status: AC
  Administered 2017-02-15 (×3): 10 meq via INTRAVENOUS

## 2017-02-15 MED ORDER — ROCURONIUM BROMIDE 10 MG/ML (PF) SYRINGE
PREFILLED_SYRINGE | INTRAVENOUS | Status: AC
  Filled 2017-02-15: qty 5

## 2017-02-15 MED FILL — Sodium Chloride IV Soln 0.9%: INTRAVENOUS | Qty: 2000 | Status: AC

## 2017-02-15 MED FILL — Heparin Sodium (Porcine) Inj 1000 Unit/ML: INTRAMUSCULAR | Qty: 20 | Status: AC

## 2017-02-15 MED FILL — Mannitol IV Soln 20%: INTRAVENOUS | Qty: 500 | Status: AC

## 2017-02-15 MED FILL — Lidocaine HCl IV Inj 20 MG/ML: INTRAVENOUS | Qty: 5 | Status: AC

## 2017-02-15 MED FILL — Electrolyte-R (PH 7.4) Solution: INTRAVENOUS | Qty: 3000 | Status: AC

## 2017-02-15 SURGICAL SUPPLY — 71 items
AGENT HMST MTR 8 SURGIFLO (HEMOSTASIS) ×2
BAG DECANTER FOR FLEXI CONT (MISCELLANEOUS) ×3 IMPLANT
BANDAGE ACE 4X5 VEL STRL LF (GAUZE/BANDAGES/DRESSINGS) ×3 IMPLANT
BANDAGE ACE 6X5 VEL STRL LF (GAUZE/BANDAGES/DRESSINGS) ×3 IMPLANT
BLADE STERNUM SYSTEM 6 (BLADE) ×3 IMPLANT
BLADE SURG 11 STRL SS (BLADE) ×1 IMPLANT
BNDG GAUZE ELAST 4 BULKY (GAUZE/BANDAGES/DRESSINGS) ×3 IMPLANT
CANISTER SUCT 3000ML PPV (MISCELLANEOUS) ×3 IMPLANT
CATH CPB KIT GERHARDT (MISCELLANEOUS) ×3 IMPLANT
CATH THORACIC 28FR (CATHETERS) ×3 IMPLANT
CRADLE DONUT ADULT HEAD (MISCELLANEOUS) ×3 IMPLANT
DRAIN CHANNEL 28F RND 3/8 FF (WOUND CARE) ×3 IMPLANT
DRAPE CARDIOVASCULAR INCISE (DRAPES) ×3
DRAPE SLUSH/WARMER DISC (DRAPES) ×3 IMPLANT
DRAPE SRG 135X102X78XABS (DRAPES) ×2 IMPLANT
DRSG AQUACEL AG ADV 3.5X10 (GAUZE/BANDAGES/DRESSINGS) ×1 IMPLANT
DRSG AQUACEL AG ADV 3.5X14 (GAUZE/BANDAGES/DRESSINGS) ×3 IMPLANT
ELECT BLADE 4.0 EZ CLEAN MEGAD (MISCELLANEOUS) ×3
ELECT REM PT RETURN 9FT ADLT (ELECTROSURGICAL) ×6
ELECTRODE BLDE 4.0 EZ CLN MEGD (MISCELLANEOUS) ×2 IMPLANT
ELECTRODE REM PT RTRN 9FT ADLT (ELECTROSURGICAL) ×4 IMPLANT
FELT TEFLON 1X6 (MISCELLANEOUS) ×7 IMPLANT
GAUZE SPONGE 4X4 12PLY STRL (GAUZE/BANDAGES/DRESSINGS) ×6 IMPLANT
GAUZE SPONGE 4X4 12PLY STRL LF (GAUZE/BANDAGES/DRESSINGS) ×2 IMPLANT
GLOVE BIO SURGEON STRL SZ 6.5 (GLOVE) ×9 IMPLANT
GLOVE SKINSENSE STRL SZ6.0 (GLOVE) ×2 IMPLANT
GOWN STRL REUS W/ TWL LRG LVL3 (GOWN DISPOSABLE) ×8 IMPLANT
GOWN STRL REUS W/TWL LRG LVL3 (GOWN DISPOSABLE) ×24
HEMOSTAT POWDER SURGIFOAM 1G (HEMOSTASIS) ×9 IMPLANT
HEMOSTAT SURGICEL 2X14 (HEMOSTASIS) ×3 IMPLANT
KIT BASIN OR (CUSTOM PROCEDURE TRAY) ×3 IMPLANT
KIT CATH SUCT 8FR (CATHETERS) ×3 IMPLANT
KIT ROOM TURNOVER OR (KITS) ×3 IMPLANT
KIT SUCTION CATH 14FR (SUCTIONS) ×6 IMPLANT
KIT VASOVIEW HEMOPRO VH 3000 (KITS) ×3 IMPLANT
LEAD PACING MYOCARDI (MISCELLANEOUS) ×3 IMPLANT
MARKER GRAFT CORONARY BYPASS (MISCELLANEOUS) ×9 IMPLANT
NS IRRIG 1000ML POUR BTL (IV SOLUTION) ×15 IMPLANT
PACK OPEN HEART (CUSTOM PROCEDURE TRAY) ×3 IMPLANT
PAD ARMBOARD 7.5X6 YLW CONV (MISCELLANEOUS) ×6 IMPLANT
PAD ELECT DEFIB RADIOL ZOLL (MISCELLANEOUS) ×3 IMPLANT
PENCIL BUTTON HOLSTER BLD 10FT (ELECTRODE) ×3 IMPLANT
PUNCH AORTIC ROTATE  4.5MM 8IN (MISCELLANEOUS) ×1 IMPLANT
SET CARDIOPLEGIA MPS 5001102 (MISCELLANEOUS) ×1 IMPLANT
SOLUTION ANTI FOG 6CC (MISCELLANEOUS) ×1 IMPLANT
SPOGE SURGIFLO 8M (HEMOSTASIS) ×1
SPONGE LAP 18X18 X RAY DECT (DISPOSABLE) ×5 IMPLANT
SPONGE SURGIFLO 8M (HEMOSTASIS) IMPLANT
SUT BONE WAX W31G (SUTURE) ×3 IMPLANT
SUT MNCRL AB 4-0 PS2 18 (SUTURE) ×1 IMPLANT
SUT PROLENE 3 0 SH1 36 (SUTURE) ×3 IMPLANT
SUT PROLENE 4 0 TF (SUTURE) ×6 IMPLANT
SUT PROLENE 6 0 CC (SUTURE) ×6 IMPLANT
SUT PROLENE 7 0 BV1 MDA (SUTURE) ×3 IMPLANT
SUT PROLENE 8 0 BV175 6 (SUTURE) ×7 IMPLANT
SUT STEEL 6MS V (SUTURE) ×3 IMPLANT
SUT STEEL SZ 6 DBL 3X14 BALL (SUTURE) ×3 IMPLANT
SUT VIC AB 1 CTX 18 (SUTURE) ×6 IMPLANT
SUT VIC AB 2-0 CT1 27 (SUTURE) ×3
SUT VIC AB 2-0 CT1 TAPERPNT 27 (SUTURE) IMPLANT
SUTURE E-PAK OPEN HEART (SUTURE) ×3 IMPLANT
SYSTEM SAHARA CHEST DRAIN ATS (WOUND CARE) ×3 IMPLANT
TAPE CLOTH SURG 4X10 WHT LF (GAUZE/BANDAGES/DRESSINGS) ×1 IMPLANT
TOWEL GREEN STERILE (TOWEL DISPOSABLE) ×12 IMPLANT
TOWEL GREEN STERILE FF (TOWEL DISPOSABLE) ×6 IMPLANT
TOWEL OR 17X24 6PK STRL BLUE (TOWEL DISPOSABLE) ×6 IMPLANT
TOWEL OR 17X26 10 PK STRL BLUE (TOWEL DISPOSABLE) ×6 IMPLANT
TRAY FOLEY SILVER 16FR TEMP (SET/KITS/TRAYS/PACK) ×3 IMPLANT
TUBING INSUFFLATION (TUBING) ×3 IMPLANT
UNDERPAD 30X30 (UNDERPADS AND DIAPERS) ×3 IMPLANT
WATER STERILE IRR 1000ML POUR (IV SOLUTION) ×6 IMPLANT

## 2017-02-15 NOTE — Consult Note (Signed)
           [facility] CM Primary Care Navigator  02/15/2017  SEYDINA HOLLIMAN 1932-06-29 [id number]   Went back to see patient at the bedside to identify possible discharge needs but RN reports that patient is in the OR for surgery (CABG) at this moment. Staff states that patient may not come back to the unit after surgery.  Will attempt to see patient at another time when available at his new location.   For questions, please contact:  Dannielle Huh, BSN, RN- [facility] Primary Care Navigator  Telephone: [phone] [place]

## 2017-02-15 NOTE — Anesthesia Procedure Notes (Signed)
Central Venous Catheter Insertion Performed by: Lillia Abed, anesthesiologist Start/End5/05/2017 6:30 AM, 02/15/2017 6:45 AM Patient location: Pre-op. Preanesthetic checklist: patient identified, IV checked, risks and benefits discussed, surgical consent, monitors and equipment checked, pre-op evaluation, timeout performed and anesthesia consent Lidocaine 1% used for infiltration and patient sedated Hand hygiene performed  and maximum sterile barriers used  Catheter size: 9 Fr Central line and PA cath was placed.MAC introducer Swan type:thermodilution Procedure performed using ultrasound guided technique. Ultrasound Notes:anatomy identified, needle tip was noted to be adjacent to the nerve/plexus identified, no ultrasound evidence of intravascular and/or intraneural injection and image(s) printed for medical record Attempts: 1 Following insertion, line sutured and dressing applied. Post procedure assessment: blood return through all ports, free fluid flow and no air  Patient tolerated the procedure well with no immediate complications.

## 2017-02-15 NOTE — Progress Notes (Signed)
Patient extubated to 4 L [place] without incident.   Patient able to speak and is alert and oriented.   NIF of -22 and VC of 952 ml.  RN at bedside. SpO2 stable throughout procedure.   Mali Reyn Faivre

## 2017-02-15 NOTE — Brief Op Note (Addendum)
      [address]       [place] [address]             [phone]       02/15/2017  11:09 AM  PATIENT:  Vincent Mcclure  83 y.o. male  PRE-OPERATIVE DIAGNOSIS:  CAD , nonstemi recent mi, cr tical left main   POST-OPERATIVE DIAGNOSIS:  Same   PROCEDURE:  Procedure(s): CORONARY ARTERY BYPASS GRAFTING (CABG) (N/A) TRANSESOPHAGEAL ECHOCARDIOGRAM (TEE) (N/A)  SVG to distal circ SVG to 1st Diag LIMA to LAD  SURGEON:  Surgeon(s) and Role:    * Grace Isaac, MD - Primary  PHYSICIAN ASSISTANT:  Nicholes Rough, PA-C  ANESTHESIA:   general  EBL:  Total I/O In: -  Out: 525 [Urine:525]  BLOOD ADMINISTERED: none  DRAINS: ROUTINE   LOCAL MEDICATIONS USED:  NONE  SPECIMEN:  No Specimen  DISPOSITION OF SPECIMEN:  N/A  COUNTS:  YES   DICTATION: .Dragon Dictation  PLAN OF CARE: Admit to inpatient   PATIENT DISPOSITION:  ICU - intubated and hemodynamically stable.   Delay start of Pharmacological VTE agent (>24hrs) due to surgical blood loss or risk of bleeding: yes

## 2017-02-15 NOTE — Progress Notes (Signed)
CTS PM Rounds  Extubated, a-pacing Labs ok CI > 2.0

## 2017-02-15 NOTE — Progress Notes (Signed)
  Echocardiogram Echocardiogram Transesophageal has been performed.  [name] 02/15/2017, 9:34 AM

## 2017-02-15 NOTE — Anesthesia Preprocedure Evaluation (Signed)
Anesthesia Evaluation  Patient identified by MRN, date of birth, ID band Patient awake    Reviewed: Allergy & Precautions, NPO status , Patient's Chart, lab work & pertinent test results  Airway Mallampati: II  TM Distance: <3 FB Neck ROM: Full    Dental  (+) Edentulous Upper   Pulmonary former smoker,    breath sounds clear to auscultation       Cardiovascular hypertension, + CAD, + Past MI and + Peripheral Vascular Disease   Rhythm:Regular Rate:Normal     Neuro/Psych    GI/Hepatic PUD, GERD  ,  Endo/Other    Renal/GU      Musculoskeletal   Abdominal   Peds  Hematology   Anesthesia Other Findings   Reproductive/Obstetrics                             Anesthesia Physical Anesthesia Plan  ASA: III  Anesthesia Plan: General   Post-op Pain Management:    Induction: Intravenous  Airway Management Planned: Oral ETT  Additional Equipment: Arterial line, PA Cath, Ultrasound Guidance Line Placement and TEE  Intra-op Plan:   Post-operative Plan: Post-operative intubation/ventilation  Informed Consent: I have reviewed the patients History and Physical, chart, labs and discussed the procedure including the risks, benefits and alternatives for the proposed anesthesia with the patient or authorized representative who has indicated his/her understanding and acceptance.   Dental advisory given  Plan Discussed with: CRNA  Anesthesia Plan Comments:         Anesthesia Quick Evaluation

## 2017-02-15 NOTE — Progress Notes (Signed)
       [address]       [place] [address]             [phone]        Vincent Mcclure has been scheduled for Procedure(s): CORONARY ARTERY BYPASS GRAFTING (CABG) (N/A) TRANSESOPHAGEAL ECHOCARDIOGRAM (TEE) (N/A) today. The various methods of treatment have been discussed with the patient. After consideration of the risks, benefits and treatment options the patient has consented to the planned procedure.   The patient has been seen and labs reviewed. There are no changes in the patient's condition to prevent proceeding with the planned procedure today.  Recent labs:  Lab Results  Component Value Date   WBC 3.9 (L) 02/15/2017   HGB 10.0 (L) 02/15/2017   HCT 32.7 (L) 02/15/2017   PLT 214 02/15/2017   GLUCOSE 105 (H) 02/15/2017   CHOL 145 02/12/2017   TRIG 83 02/12/2017   HDL 43 02/12/2017   LDLDIRECT 133.2 10/14/2011   LDLCALC 85 02/12/2017   ALT 11 (L) 02/14/2017   AST 18 02/14/2017   NA 131 (L) 02/15/2017   K 3.7 02/15/2017   CL 98 (L) 02/15/2017   CREATININE 1.03 02/15/2017   BUN 10 02/15/2017   CO2 24 02/15/2017   TSH 1.10 08/11/2016   PSA 0.54 01/24/2015   INR 1.19 02/14/2017   HGBA1C 5.9 (H) 02/12/2017   renal function stable, anemia noted    Grace Isaac, MD 02/15/2017 7:11 AM

## 2017-02-15 NOTE — Progress Notes (Signed)
ANTICOAGULATION CONSULT NOTE- follow up  Pharmacy Consult for Heparin Indication: chest pain/ACS  Assessment: 83 yo M s/p cardiac cath on 5/7 on  IV heparin for anticipated CABG. Heparin infusion resumed 5/7 @ 1700 at the pre- cardiac cath rate. No bleeding or infusion related issues reported.   Heparin level therapeutic: 0.30  Goal of Therapy:  Heparin level 0.3-0.7 units/ml Monitor platelets by anticoagulation protocol: Yes   Plan:  Continue hepatin gtt at 1100 units/hr Daily heparin level and CBC Monitor for s/sx of bleeding  Allergies  Allergen Reactions  . No Known Allergies     Patient Measurements: Height: '5\' 11"'$  (180.3 cm) Weight: 210 lb 1.6 oz (95.3 kg) IBW/kg (Calculated) : 75.3 Heparin Dosing Weight: 95 kg  Vital Signs: Temp: 98.1 F (36.7 C) (05/07 2208) Temp Source: Oral (05/07 2208) BP: 128/79 (05/07 2208) Pulse Rate: 76 (05/07 2208)  Labs:  Recent Labs  02/12/17 0657 02/12/17 1321  02/13/17 0245 02/13/17 0922  02/14/17 0504 02/14/17 0929 02/14/17 1438 02/14/17 2141 02/15/17 0036  HGB  --  10.5*  < > 10.2* 10.3*  < > 9.8* 10.0*  --  10.0*  --   HCT  --  34.8*  < > 32.4* 32.8*  < > 31.6* 30.9*  --  31.9*  --   PLT  --   --   --  222  --   --  210  --   --   --   --   APTT  --   --   --   --   --   --   --   --   --  71*  --   LABPROT  --   --   --   --   --   --  15.2  --   --   --   --   INR  --   --   --   --   --   --  1.19  --   --   --   --   HEPARINUNFRC  --  0.39  < > 0.57  --   --  0.82*  --   --   --  0.30  CREATININE  --   --   --   --   --   --  1.18  --  1.18  --   --   TROPONINI 2.21* 2.24*  --   --  1.03*  --   --   --   --   --   --   < > = values in this interval not displayed.  Estimated Creatinine Clearance: 55.9 mL/min (by C-G formula based on SCr of 1.18 mg/dL).  Thank you for allowing pharmacy to be part of this patients care team.  Georga Bora, PharmD Clinical Pharmacist 02/15/2017 2:02 AM

## 2017-02-15 NOTE — Progress Notes (Signed)
Patient PAD <15 and trending hypotensive; patient is slow to respond to albumin; requested additional albumin during night if needed to maintain parameters; MD ordered 2 additional albumin as needed to maintain parameters; also gave verbal order to administer calcium chloride alongside next albumin.  Will continue to monitor  Clyda Hurdle RN

## 2017-02-15 NOTE — Plan of Care (Signed)
CBG 140 but stabilizer protacol states to run at zero, will Dc drip orders and change to every 4 hour sliding scale.

## 2017-02-15 NOTE — Transfer of Care (Signed)
Immediate Anesthesia Transfer of Care Note  Patient: Vincent Mcclure  Procedure(s) Performed: Procedure(s): CORONARY ARTERY BYPASS GRAFTING times three  with left internal mammary harvest and endoscopic harvest of Right SVG. Grafts of LIMA to  LAD, SVG to Distal Circ, and to First Diag. (N/A) TRANSESOPHAGEAL ECHOCARDIOGRAM (TEE) (N/A)  Patient Location: SICU  Anesthesia Type:General  Level of Consciousness: sedated and Patient remains intubated per anesthesia plan  Airway & Oxygen Therapy: Patient remains intubated per anesthesia plan and Patient placed on Ventilator (see vital sign flow sheet for setting)  Post-op Assessment: Report given to RN and Post -op Vital signs reviewed and stable  Post vital signs: Reviewed and stable  Last Vitals:  Vitals:   02/14/17 2208 02/15/17 0453  BP: 128/79 135/74  Pulse: 76 76  Resp:    Temp: 36.7 C 36.6 C    Last Pain:  Vitals:   02/15/17 0453  TempSrc: Oral  PainSc:       Patients Stated Pain Goal: 0 (66/81/59 4707)  Complications: No apparent anesthesia complications     Pt VSS during transport to ICU. Pt met my ICU RN and RT. Report given to RN/RT and all questions answered. Pt connected to ICU monitors and continued stability confirmed.   BP 97/58 MAP 67 upon departure from SICU.

## 2017-02-15 NOTE — Anesthesia Procedure Notes (Signed)
Procedure Name: Intubation Date/Time: 02/15/2017 8:16 AM Performed by: Merdis Delay Pre-anesthesia Checklist: Patient identified, Emergency Drugs available, Suction available, Patient being monitored and Timeout performed Patient Re-evaluated:Patient Re-evaluated prior to inductionOxygen Delivery Method: Circle system utilized Preoxygenation: Pre-oxygenation with 100% oxygen Intubation Type: IV induction Ventilation: Mask ventilation without difficulty and Oral airway inserted - appropriate to patient size Laryngoscope Size: Mac and 4 Grade View: Grade I Tube type: Oral Tube size: 8.0 mm Number of attempts: 1 Airway Equipment and Method: Stylet Placement Confirmation: ETT inserted through vocal cords under direct vision,  positive ETCO2 and breath sounds checked- equal and bilateral Secured at: 23 cm Tube secured with: Tape Dental Injury: Teeth and Oropharynx as per pre-operative assessment

## 2017-02-15 NOTE — Progress Notes (Signed)
Vincent Bogert Brownis a 83 y.o.malewith medical history significant of hypertension, hyperlipidemia, GERD, varicose vein in lower extremities, PVD, IBS, gastric ulcer, GI bleeding, alcohol abuse, iron deficiency anemia, presented with chest pain. Underwent cardiac cath, was found to have three vessel disease, tcts consulted,CABG today and pt in ICU intubated on TCTS service.  TRH will sign off.    Hosie Poisson, MD [phone]

## 2017-02-16 ENCOUNTER — Inpatient Hospital Stay (HOSPITAL_COMMUNITY): Payer: Medicare HMO

## 2017-02-16 ENCOUNTER — Encounter (HOSPITAL_COMMUNITY): Payer: Self-pay | Admitting: Cardiothoracic Surgery

## 2017-02-16 LAB — GLUCOSE, CAPILLARY
GLUCOSE-CAPILLARY: 108 mg/dL — AB (ref 65–99)
GLUCOSE-CAPILLARY: 119 mg/dL — AB (ref 65–99)
GLUCOSE-CAPILLARY: 131 mg/dL — AB (ref 65–99)
Glucose-Capillary: 112 mg/dL — ABNORMAL HIGH (ref 65–99)
Glucose-Capillary: 115 mg/dL — ABNORMAL HIGH (ref 65–99)
Glucose-Capillary: 125 mg/dL — ABNORMAL HIGH (ref 65–99)
Glucose-Capillary: 130 mg/dL — ABNORMAL HIGH (ref 65–99)
Glucose-Capillary: 140 mg/dL — ABNORMAL HIGH (ref 65–99)
Glucose-Capillary: 164 mg/dL — ABNORMAL HIGH (ref 65–99)

## 2017-02-16 LAB — POCT I-STAT, CHEM 8
BUN: 18 mg/dL (ref 6–20)
CALCIUM ION: 1.26 mmol/L (ref 1.15–1.40)
Chloride: 96 mmol/L — ABNORMAL LOW (ref 101–111)
Creatinine, Ser: 1.7 mg/dL — ABNORMAL HIGH (ref 0.61–1.24)
Glucose, Bld: 152 mg/dL — ABNORMAL HIGH (ref 65–99)
HCT: 28 % — ABNORMAL LOW (ref 39.0–52.0)
Hemoglobin: 9.5 g/dL — ABNORMAL LOW (ref 13.0–17.0)
Potassium: 4.2 mmol/L (ref 3.5–5.1)
SODIUM: 132 mmol/L — AB (ref 135–145)
TCO2: 24 mmol/L (ref 0–100)

## 2017-02-16 LAB — CBC
HEMATOCRIT: 27.3 % — AB (ref 39.0–52.0)
HEMATOCRIT: 27.9 % — AB (ref 39.0–52.0)
HEMOGLOBIN: 8.4 g/dL — AB (ref 13.0–17.0)
Hemoglobin: 8.4 g/dL — ABNORMAL LOW (ref 13.0–17.0)
MCH: 23 pg — AB (ref 26.0–34.0)
MCH: 22.8 pg — ABNORMAL LOW (ref 26.0–34.0)
MCHC: 30.8 g/dL (ref 30.0–36.0)
MCHC: 30.1 g/dL (ref 30.0–36.0)
MCV: 74.8 fL — AB (ref 78.0–100.0)
MCV: 75.6 fL — ABNORMAL LOW (ref 78.0–100.0)
Platelets: 160 10*3/uL (ref 150–400)
Platelets: 173 10*3/uL (ref 150–400)
RBC: 3.65 MIL/uL — ABNORMAL LOW (ref 4.22–5.81)
RBC: 3.69 MIL/uL — ABNORMAL LOW (ref 4.22–5.81)
RDW: 17.1 % — ABNORMAL HIGH (ref 11.5–15.5)
RDW: 17.4 % — ABNORMAL HIGH (ref 11.5–15.5)
WBC: 8.5 10*3/uL (ref 4.0–10.5)
WBC: 11.1 10*3/uL — ABNORMAL HIGH (ref 4.0–10.5)

## 2017-02-16 LAB — BASIC METABOLIC PANEL
Anion gap: 11 (ref 5–15)
BUN: 13 mg/dL (ref 6–20)
CHLORIDE: 102 mmol/L (ref 101–111)
CO2: 20 mmol/L — AB (ref 22–32)
CREATININE: 1.16 mg/dL (ref 0.61–1.24)
Calcium: 8.6 mg/dL — ABNORMAL LOW (ref 8.9–10.3)
GFR calc non Af Amer: 56 mL/min — ABNORMAL LOW (ref >60)
Glucose, Bld: 122 mg/dL — ABNORMAL HIGH (ref 65–99)
Potassium: 4 mmol/L (ref 3.5–5.1)
Sodium: 133 mmol/L — ABNORMAL LOW (ref 135–145)

## 2017-02-16 LAB — MAGNESIUM
MAGNESIUM: 2.3 mg/dL (ref 1.7–2.4)
Magnesium: 2.2 mg/dL (ref 1.7–2.4)

## 2017-02-16 LAB — CREATININE, SERUM
Creatinine, Ser: 1.75 mg/dL — ABNORMAL HIGH (ref 0.61–1.24)
GFR, EST AFRICAN AMERICAN: 40 mL/min — AB (ref >60)
GFR, EST NON AFRICAN AMERICAN: 34 mL/min — AB (ref >60)

## 2017-02-16 MED ORDER — INSULIN DETEMIR 100 UNIT/ML ~~LOC~~ SOLN
10.0000 [IU] | Freq: Once | SUBCUTANEOUS | Status: AC
  Administered 2017-02-16: 10 [IU] via SUBCUTANEOUS
  Filled 2017-02-16: qty 0.1

## 2017-02-16 MED ORDER — OXYCODONE HCL 5 MG PO TABS
5.0000 mg | ORAL_TABLET | ORAL | Status: DC | PRN
  Administered 2017-02-17 – 2017-02-25 (×24): 5 mg via ORAL
  Filled 2017-02-16 (×25): qty 1

## 2017-02-16 MED ORDER — TRAMADOL HCL 50 MG PO TABS
50.0000 mg | ORAL_TABLET | ORAL | Status: DC | PRN
  Administered 2017-02-20: 50 mg via ORAL
  Filled 2017-02-16 (×2): qty 1

## 2017-02-16 MED ORDER — ASPIRIN EC 81 MG PO TBEC
81.0000 mg | DELAYED_RELEASE_TABLET | Freq: Every day | ORAL | Status: DC
  Administered 2017-02-17 – 2017-02-25 (×9): 81 mg via ORAL
  Filled 2017-02-16 (×9): qty 1

## 2017-02-16 MED ORDER — INSULIN ASPART 100 UNIT/ML ~~LOC~~ SOLN
0.0000 [IU] | SUBCUTANEOUS | Status: DC
  Administered 2017-02-16 – 2017-02-17 (×6): 2 [IU] via SUBCUTANEOUS
  Administered 2017-02-17: 4 [IU] via SUBCUTANEOUS
  Administered 2017-02-17 – 2017-02-18 (×4): 2 [IU] via SUBCUTANEOUS

## 2017-02-16 MED ORDER — DOPAMINE-DEXTROSE 3.2-5 MG/ML-% IV SOLN
0.0000 ug/kg/min | INTRAVENOUS | Status: DC
  Administered 2017-02-17 – 2017-02-19 (×2): 4 ug/kg/min via INTRAVENOUS
  Filled 2017-02-16 (×2): qty 250

## 2017-02-16 MED ORDER — INSULIN DETEMIR 100 UNIT/ML ~~LOC~~ SOLN
10.0000 [IU] | Freq: Every day | SUBCUTANEOUS | Status: DC
  Administered 2017-02-17 – 2017-02-20 (×4): 10 [IU] via SUBCUTANEOUS
  Filled 2017-02-16 (×5): qty 0.1

## 2017-02-16 MED ORDER — FUROSEMIDE 10 MG/ML IJ SOLN
40.0000 mg | Freq: Once | INTRAMUSCULAR | Status: AC
  Administered 2017-02-16: 40 mg via INTRAVENOUS
  Filled 2017-02-16: qty 4

## 2017-02-16 MED ORDER — METOCLOPRAMIDE HCL 5 MG/ML IJ SOLN
5.0000 mg | Freq: Three times a day (TID) | INTRAMUSCULAR | Status: AC
  Administered 2017-02-16 – 2017-02-17 (×3): 5 mg via INTRAVENOUS
  Filled 2017-02-16 (×3): qty 2

## 2017-02-16 MED ORDER — LACTATED RINGERS IV SOLN
INTRAVENOUS | Status: DC
  Administered 2017-02-19: via INTRAVENOUS

## 2017-02-16 MED ORDER — ENOXAPARIN SODIUM 30 MG/0.3ML ~~LOC~~ SOLN
30.0000 mg | Freq: Every day | SUBCUTANEOUS | Status: DC
  Administered 2017-02-16 – 2017-02-24 (×9): 30 mg via SUBCUTANEOUS
  Filled 2017-02-16 (×9): qty 0.3

## 2017-02-16 MED ORDER — FUROSEMIDE 10 MG/ML IJ SOLN
40.0000 mg | Freq: Once | INTRAMUSCULAR | Status: AC
Start: 2017-02-16 — End: 2017-02-16
  Administered 2017-02-16: 40 mg via INTRAVENOUS
  Filled 2017-02-16: qty 4

## 2017-02-16 MED ORDER — ASPIRIN 81 MG PO CHEW: 81.0000 mg | CHEWABLE_TABLET | Freq: Every day | ORAL | Status: DC

## 2017-02-16 MED FILL — Magnesium Sulfate Inj 50%: INTRAMUSCULAR | Qty: 10 | Status: AC

## 2017-02-16 MED FILL — Heparin Sodium (Porcine) Inj 1000 Unit/ML: INTRAMUSCULAR | Qty: 30 | Status: AC

## 2017-02-16 MED FILL — Potassium Chloride Inj 2 mEq/ML: INTRAVENOUS | Qty: 40 | Status: AC

## 2017-02-16 NOTE — Care Management Note (Signed)
Case Management Note Marvetta Gibbons RN, BSN Unit 2W-Case Manager-- [place] coverage [phone]  Patient Details  Name: Vincent Mcclure MRN: [id number] Date of Birth: Jan 25, 1932  Subjective/Objective:  Pt admitted with NSTEMI, s/p CABGx3 on 02/15/17                  Action/Plan: PTA pt lived at home with wife- CM to follow post op for d/c needs  Expected Discharge Date:                  Expected Discharge Plan:     In-House Referral:     Discharge planning Services  CM Consult  Post Acute Care Choice:    Choice offered to:     DME Arranged:    DME Agency:     HH Arranged:    HH Agency:     Status of Service:  In process, will continue to follow  If discussed at Long Length of Stay Meetings, dates discussed:  5/10  Discharge Disposition:   Additional Comments:  Dawayne Patricia, RN 02/16/2017, 11:53 AM

## 2017-02-16 NOTE — Op Note (Signed)
NAMEMATTIX, IMHOF NO.:  [id number]  MEDICAL RECORD NO.:  [id number]  LOCATION:  3W23C                        FACILITY:  [facility]  PHYSICIAN:  Lanelle Bal, MD    DATE OF BIRTH:  [birth year]  DATE OF PROCEDURE:  02/15/2017 DATE OF DISCHARGE:                              OPERATIVE REPORT   POSTOPERATIVE DIAGNOSIS:  Coronary occlusive disease with recent non-ST- elevation myocardial infarction with critical left main obstruction.  POSTOPERATIVE DIAGNOSIS:  Coronary occlusive disease with recent non-ST- elevation myocardial infarction with critical left main obstruction.  SURGICAL PROCEDURE:  Coronary artery bypass grafting x3 with left internal mammary to the left anterior descending coronary artery, reverse saphenous vein graft to the diagonal coronary artery, reverse saphenous vein graft to the distal circumflex coronary artery with right thigh greater saphenous endovein harvesting.  SURGEON:  Lanelle Bal, MD  FIRST ASSISTANT:  Nicholes Rough, PA.  BRIEF HISTORY:  The patient is an 83 year old male with no previous history of coronary artery disease, who presents with episode of prolonged chest pain, came on while he was sitting at home, talking with his son.  He was admitted, stabilized medically, seen by Cardiology, and admitted on Feb 11, 2017, seen by Cardiology, and underwent cardiac catheterization on Feb 14, 2017.  At the time of catheterization by Dr. Tamala Julian, the patient was noted to have an 80% to 90% distal left main obstruction and an 80% to 90% stenosis of the LAD just past the takeoff of the diagonal.  The right coronary artery was a large dominant vessel. It was diseased to 70% in the distal third of the posterior descending. Overall, ventricular function appeared preserved.  Because of the patient's critical anatomy and his symptoms, coronary artery bypass grafting was recommended.  The patient agreed and signed  informed consent.  DESCRIPTION OF PROCEDURE:  With Swan-Ganz and arterial line monitors in place, the patient underwent general endotracheal anesthesia without incident.  Skin of chest and legs was prepped with Betadine and draped in usual sterile manner.  Using the Guidant endovein, appropriate time- out was performed and then we proceeded with endovein harvesting of the right greater saphenous vein from the thigh.  The patient did have a history of varicose veins.  The preop vein mapping showed the veins to be compressible.  Two segments of vein were harvested from the right thigh and were slightly large, but of excellent quality.  Median sternotomy was performed.  Left internal mammary artery was dissected down as a pedicle graft.  The distal artery was divided and had good free flow.  Pericardium was opened.  Overall, ventricular function appeared preserved.  The patient was systemically heparinized. Ascending aorta was cannulated.  The right atrium was cannulated.  An aortic root vent cardioplegia needle was introduced to the ascending aorta.  The patient was placed on cardiopulmonary bypass 2.4 L/min/m2. Sites of anastomosis were selected and dissected out of the epicardium. We had, on examination of the lesion of the posterior descending, the very distal portion of the posterior descending was significantly calcified and was a poor quality vessel.  Because of the distal nature of this lesion, we did not attempt to bypass it.  The patient's circumflex system was relatively small and the branches as they arose from the AV groove were approximately 1 mm.  The aortic root vent cardioplegia needle was introduced into the ascending aorta.  Aortic cross-clamp was applied and 500 mL of cold blood potassium cardioplegia was administered with diastolic arrest of the heart.  Myocardial septal temperature was monitored throughout the crossclamp.  Attention was turned first to the distal  circumflex vessel.  This vessel was opened and as noted was a small barely admitting a 1 mm probe using a running 8- 0 Prolene, and distal anastomosis was performed with a segment of reverse saphenous vein graft.  Attention was then turned to the diagonal coronary artery.  This was a larger vessel, did admit a 1.5 mm probe distally.  Using a running 7-0 Prolene, distal anastomosis was performed with a segment of reverse saphenous vein grafts.  Attention was then turned to the LAD between the mid and distal third of the vessel.  The LAD was opened.  Using a running 8-0 Prolene, left internal mammary artery was anastomosed to left anterior descending coronary artery. With release of Edwards bulldog on the mammary artery, there was rise in myocardial septal temperature.  The bulldog was placed back on the mammary artery.  With the crossclamp still in place, 2-punch aortotomies were performed and each of the two vein grafts were anastomosed to the ascending aorta.  The heart was allowed to passively fill and de-air. The bulldog was removed from the mammary artery with rise in myocardial septal temperature.  Aortic crossclamp was removed.  Total cross-clamp time of 85 minutes.  The patient spontaneously converted to a sinus rhythm.  Atrial and ventricular pacing wires were applied.  Sites of anastomosis were inspected, free of bleeding.  With the patient's body temperature rewarmed to 37 degrees, he was then ventilated and weaned from cardiopulmonary bypass without difficulty, remaining hemodynamically stable.  He was decannulated in usual fashion. Protamine sulfate was administered with operative field hemostatic. Atrial and ventricular pacing wires were applied.  Graft markers were applied.  A left pleural tube and a Blake mediastinal drain were left in place.  Sternum was closed with #6 stainless steel wire.  Fascia was closed with interrupted 0 Vicryl and 3-0 Vicryl subcutaneous tissue  for subcuticular stitch in skin edges.  Dry dressings were applied.  Sponge and needle count was reported as correct after completion of the procedure.  The patient tolerated the procedure without obvious complication and was transferred to the Surgical Intensive Care Unit for further postoperative care.  The patient did not require any blood bank, blood products during the operative procedure.  His total pump time was 114 minutes.     Lanelle Bal, MD     EG/MEDQ  D:  02/16/2017  T:  02/16/2017  Job:  [id number]

## 2017-02-16 NOTE — Anesthesia Postprocedure Evaluation (Addendum)
Anesthesia Post Note  Patient: Vincent Mcclure  Procedure(s) Performed: Procedure(s) (LRB): CORONARY ARTERY BYPASS GRAFTING times three  with left internal mammary harvest and endoscopic harvest of Right SVG. Grafts of LIMA to  LAD, SVG to Distal Circ, and to First Diag. (N/A) TRANSESOPHAGEAL ECHOCARDIOGRAM (TEE) (N/A)  Patient location during evaluation: SICU Anesthesia Type: General Level of consciousness: sedated and patient remains intubated per anesthesia plan Pain management: pain level controlled Vital Signs Assessment: post-procedure vital signs reviewed and stable Respiratory status: patient remains intubated per anesthesia plan Cardiovascular status: stable Anesthetic complications: no       Last Vitals:  Vitals:   02/16/17 0745 02/16/17 0800  BP:  113/71  Pulse: 90 90  Resp: 19 20  Temp: 36.5 C 36.5 C    Last Pain:  Vitals:   02/16/17 0722  TempSrc:   PainSc: 5                  Zania Kalisz,JAMES TERRILL

## 2017-02-16 NOTE — Progress Notes (Signed)
      [address]       [place] [address]             [phone]      POD # 1 CABG x 3  BP 98/81   Pulse 94   Temp 98.1 F (36.7 C) (Oral)   Resp 20   Ht '5\' 11"'$  (1.803 m)   Wt 216 lb 9.6 oz (98.2 kg)   SpO2 94%   BMI 30.21 kg/m  RA 91-94% sat  Intake/Output Summary (Last 24 hours) at 02/16/17 1712 Last data filed at 02/16/17 1700  Gross per 24 hour  Intake          2854.97 ml  Output             1750 ml  Net          1104.97 ml   Creatinine up to 1.7 from 1.16 K= 4.2 Hct= 28  Had good cardiac index overnight with dopamine- will resume drip and recheck creatinine in AM  Cam Dauphin C. Roxan Hockey, MD [facility] [phone]

## 2017-02-16 NOTE — Progress Notes (Signed)
Patient ID: Vincent Mcclure, male   DOB: [birth year], 83 y.o.   MRN: [id number] [facility] DAILY ICU PROGRESS NOTE                   [address]            [place] [address]          [phone]   1 Day Post-Op Procedure(s) (LRB): CORONARY ARTERY BYPASS GRAFTING times three  with left internal mammary harvest and endoscopic harvest of Right SVG. Grafts of LIMA to  LAD, SVG to Distal Circ, and to First Diag. (N/A) TRANSESOPHAGEAL ECHOCARDIOGRAM (TEE) (N/A)  Total Length of Stay:  LOS: 4 days   Subjective: Patient awake and alert, talkative, not confused   Objective: Vital signs in last 24 hours: Temp:  [97 F (36.1 C)-98.8 F (37.1 C)] 98.6 F (37 C) (05/09 0600) Pulse Rate:  [73-96] 90 (05/09 0600) Cardiac Rhythm: Normal sinus rhythm (05/09 0400) Resp:  [12-23] 22 (05/09 0600) BP: (86-115)/(44-68) 86/62 (05/09 0600) SpO2:  [94 %-100 %] 99 % (05/09 0600) Arterial Line BP: (102-148)/(46-75) 105/52 (05/09 0600) FiO2 (%):  [40 %-50 %] 40 % (05/08 1708) Weight:  [207 lb 10.8 oz (94.2 kg)-216 lb 9.6 oz (98.2 kg)] 216 lb 9.6 oz (98.2 kg) (05/09 0500)  Filed Weights   02/15/17 0453 02/15/17 1424 02/16/17 0500  Weight: 207 lb 11.2 oz (94.2 kg) 207 lb 10.8 oz (94.2 kg) 216 lb 9.6 oz (98.2 kg)    Weight change: -0.4 oz (-0.012 kg)   Hemodynamic parameters for last 24 hours: PAP: (25-42)/(8-27) 34/20 CO:  [3.8 L/min-5.9 L/min] 5.8 L/min CI:  [1.8 L/min/m2-2.7 L/min/m2] 2.7 L/min/m2  Intake/Output from previous day: 05/08 0701 - 05/09 0700 In: 5962.6 [I.V.:3852.6; Blood:360; IV Piggyback:1750] Out: 9024 [Urine:3730; Blood:600; Chest Tube:350]  Intake/Output this shift: No intake/output data recorded.  Current Meds: Scheduled Meds: . acetaminophen  1,000 mg Oral Q6H   Or  . acetaminophen (TYLENOL) oral liquid 160 mg/5 mL  1,000 mg Per Tube Q6H  . aspirin EC  325 mg Oral Daily   Or  . aspirin  324 mg Per Tube Daily  . atorvastatin  80 mg Oral q1800  . bisacodyl   10 mg Oral Daily   Or  . bisacodyl  10 mg Rectal Daily  . docusate sodium  200 mg Oral Daily  . insulin aspart  0-24 Units Subcutaneous Q4H  . mouth rinse  15 mL Mouth Rinse BID  . metoprolol tartrate  12.5 mg Oral BID   Or  . metoprolol tartrate  12.5 mg Per Tube BID  . [START ON 02/17/2017] pantoprazole  40 mg Oral Daily  . sodium chloride flush  3 mL Intravenous Q12H   Continuous Infusions: . sodium chloride 20 mL/hr at 02/16/17 0600  . sodium chloride    . sodium chloride    . albumin human    . cefUROXime (ZINACEF)  IV Stopped (02/16/17 0444)  . dexmedetomidine (PRECEDEX) IV infusion Stopped (02/15/17 1636)  . DOPamine 3.001 mcg/kg/min (02/16/17 0600)  . famotidine (PEPCID) IV Stopped (02/15/17 1345)  . lactated ringers    . lactated ringers    . lactated ringers 20 mL/hr at 02/16/17 0600  . nitroGLYCERIN Stopped (02/16/17 0036)  . phenylephrine (NEO-SYNEPHRINE) Adult infusion Stopped (02/15/17 2148)   PRN Meds:.sodium chloride, albumin human, lactated ringers, metoprolol, midazolam, morphine injection, ondansetron (ZOFRAN) IV, oxyCODONE, sodium chloride flush, traMADol  General appearance: alert, cooperative, appears stated age and  no distress Neurologic: intact Heart: regular rate and rhythm, S1, S2 normal, no murmur, click, rub or gallop Lungs: diminished breath sounds bibasilar Abdomen: soft, non-tender; bowel sounds normal; no masses,  no organomegaly Extremities: extremities normal, atraumatic, no cyanosis or edema and Homans sign is negative, no sign of DVT Wound: sternum stable   Lab Results: CBC: Recent Labs  02/15/17 1907 02/16/17 0430  WBC 8.8 8.5  HGB 9.5* 8.4*  HCT 30.8* 27.3*  PLT 168 160   BMET:  Recent Labs  02/15/17 0501  02/15/17 1857 02/15/17 1907 02/16/17 0430  NA 131*  < > 132*  --  133*  K 3.7  < > 3.7  --  4.0  CL 98*  < > 101  --  102  CO2 24  --   --   --  20*  GLUCOSE 105*  < > 178*  --  122*  BUN 10  < > 12  --  13    CREATININE 1.03  < > 0.90 1.09 1.16  CALCIUM 9.0  --   --   --  8.6*  < > = values in this interval not displayed.  CMET: Lab Results  Component Value Date   WBC 8.5 02/16/2017   HGB 8.4 (L) 02/16/2017   HCT 27.3 (L) 02/16/2017   PLT 160 02/16/2017   GLUCOSE 122 (H) 02/16/2017   CHOL 145 02/12/2017   TRIG 83 02/12/2017   HDL 43 02/12/2017   LDLDIRECT 133.2 10/14/2011   LDLCALC 85 02/12/2017   ALT 11 (L) 02/14/2017   AST 18 02/14/2017   NA 133 (L) 02/16/2017   K 4.0 02/16/2017   CL 102 02/16/2017   CREATININE 1.16 02/16/2017   BUN 13 02/16/2017   CO2 20 (L) 02/16/2017   TSH 1.10 08/11/2016   PSA 0.54 01/24/2015   INR 1.36 02/15/2017   HGBA1C 5.9 (H) 02/14/2017      PT/INR:  Recent Labs  02/15/17 1315  LABPROT 16.9*  INR 1.36   Radiology: Dg Chest Port 1 View  Result Date: 02/15/2017 CLINICAL DATA:  Followup CABG EXAM: PORTABLE CHEST 1 VIEW COMPARISON:  02/11/2017 FINDINGS: Endotracheal tube tip is 5 cm above the carina. Nasogastric tube has its tip at the GE junction with side oral in the distal esophagus. Swan-Ganz catheter placed from a right internal jugular approach has its tip at the proximal main pulmonary artery. Left chest tube is in place. Tiny amount of pleural air at the apex. Mild atelectasis in the left lower lobe. No edema. Mediastinal drains are in place. IMPRESSION: Nasogastric tube tip at the GE junction. Endotracheal tube, Swan-Ganz catheter and left chest tube as described. Mild left lower lobe atelectasis. Electronically Signed   By: Nelson Chimes M.D.   On: 02/15/2017 13:58     Assessment/Plan: S/P Procedure(s) (LRB): CORONARY ARTERY BYPASS GRAFTING times three  with left internal mammary harvest and endoscopic harvest of Right SVG. Grafts of LIMA to  LAD, SVG to Distal Circ, and to First Diag. (N/A) TRANSESOPHAGEAL ECHOCARDIOGRAM (TEE) (N/A) Mobilize Diuresis Diabetes control d/c tubes/lines See progression orders Expected Acute  Blood - loss  Anemia Wean dopamine off   Grace Isaac 02/16/2017 7:12 AM

## 2017-02-17 ENCOUNTER — Inpatient Hospital Stay (HOSPITAL_COMMUNITY): Payer: Medicare HMO

## 2017-02-17 LAB — COOXEMETRY PANEL
Carboxyhemoglobin: 1.4 % (ref 0.5–1.5)
Carboxyhemoglobin: 1.1 % (ref 0.5–1.5)
Methemoglobin: 0.7 % (ref 0.0–1.5)
Methemoglobin: 1 % (ref 0.0–1.5)
O2 Saturation: 45.8 %
O2 Saturation: 45.4 %
Total hemoglobin: 8 g/dL — ABNORMAL LOW (ref 12.0–16.0)
Total hemoglobin: 9.4 g/dL — ABNORMAL LOW (ref 12.0–16.0)

## 2017-02-17 LAB — BASIC METABOLIC PANEL
Anion gap: 8 (ref 5–15)
Anion gap: 9 (ref 5–15)
BUN: 21 mg/dL — ABNORMAL HIGH (ref 6–20)
BUN: 28 mg/dL — ABNORMAL HIGH (ref 6–20)
CO2: 24 mmol/L (ref 22–32)
CO2: 22 mmol/L (ref 22–32)
Calcium: 8.6 mg/dL — ABNORMAL LOW (ref 8.9–10.3)
Calcium: 8.3 mg/dL — ABNORMAL LOW (ref 8.9–10.3)
Chloride: 100 mmol/L — ABNORMAL LOW (ref 101–111)
Chloride: 99 mmol/L — ABNORMAL LOW (ref 101–111)
Creatinine, Ser: 2.01 mg/dL — ABNORMAL HIGH (ref 0.61–1.24)
Creatinine, Ser: 2.55 mg/dL — ABNORMAL HIGH (ref 0.61–1.24)
GFR calc Af Amer: 34 mL/min — ABNORMAL LOW (ref >60)
GFR calc Af Amer: 25 mL/min — ABNORMAL LOW (ref >60)
GFR calc non Af Amer: 29 mL/min — ABNORMAL LOW (ref >60)
GFR calc non Af Amer: 22 mL/min — ABNORMAL LOW (ref >60)
Glucose, Bld: 132 mg/dL — ABNORMAL HIGH (ref 65–99)
Glucose, Bld: 159 mg/dL — ABNORMAL HIGH (ref 65–99)
Potassium: 3.9 mmol/L (ref 3.5–5.1)
Potassium: 3.4 mmol/L — ABNORMAL LOW (ref 3.5–5.1)
Sodium: 132 mmol/L — ABNORMAL LOW (ref 135–145)
Sodium: 130 mmol/L — ABNORMAL LOW (ref 135–145)

## 2017-02-17 LAB — CBC
HCT: 25.5 % — ABNORMAL LOW (ref 39.0–52.0)
HCT: 23.7 % — ABNORMAL LOW (ref 39.0–52.0)
Hemoglobin: 7.7 g/dL — ABNORMAL LOW (ref 13.0–17.0)
Hemoglobin: 7.6 g/dL — ABNORMAL LOW (ref 13.0–17.0)
MCH: 22.7 pg — ABNORMAL LOW (ref 26.0–34.0)
MCH: 24.1 pg — ABNORMAL LOW (ref 26.0–34.0)
MCHC: 30.2 g/dL (ref 30.0–36.0)
MCHC: 32.1 g/dL (ref 30.0–36.0)
MCV: 75.2 fL — ABNORMAL LOW (ref 78.0–100.0)
MCV: 75.2 fL — ABNORMAL LOW (ref 78.0–100.0)
Platelets: 154 10*3/uL (ref 150–400)
Platelets: 144 10*3/uL — ABNORMAL LOW (ref 150–400)
RBC: 3.39 MIL/uL — ABNORMAL LOW (ref 4.22–5.81)
RBC: 3.15 MIL/uL — ABNORMAL LOW (ref 4.22–5.81)
RDW: 17.4 % — ABNORMAL HIGH (ref 11.5–15.5)
RDW: 17.8 % — ABNORMAL HIGH (ref 11.5–15.5)
WBC: 8.9 10*3/uL (ref 4.0–10.5)
WBC: 7.7 10*3/uL (ref 4.0–10.5)

## 2017-02-17 LAB — GLUCOSE, CAPILLARY
GLUCOSE-CAPILLARY: 125 mg/dL — AB (ref 65–99)
GLUCOSE-CAPILLARY: 124 mg/dL — AB (ref 65–99)
GLUCOSE-CAPILLARY: 162 mg/dL — AB (ref 65–99)
GLUCOSE-CAPILLARY: 152 mg/dL — AB (ref 65–99)
GLUCOSE-CAPILLARY: 123 mg/dL — AB (ref 65–99)
Glucose-Capillary: 140 mg/dL — ABNORMAL HIGH (ref 65–99)

## 2017-02-17 LAB — PREPARE RBC (CROSSMATCH)

## 2017-02-17 MED ORDER — FUROSEMIDE 10 MG/ML IJ SOLN
80.0000 mg | Freq: Once | INTRAMUSCULAR | Status: AC
  Administered 2017-02-17: 80 mg via INTRAVENOUS
  Filled 2017-02-17: qty 8

## 2017-02-17 MED ORDER — AMIODARONE HCL IN DEXTROSE 360-4.14 MG/200ML-% IV SOLN
60.0000 mg/h | INTRAVENOUS | Status: AC
  Administered 2017-02-17 (×2): 60 mg/h via INTRAVENOUS
  Filled 2017-02-17: qty 400

## 2017-02-17 MED ORDER — ALBUMIN HUMAN 5 % IV SOLN
INTRAVENOUS | Status: AC
  Filled 2017-02-17: qty 250

## 2017-02-17 MED ORDER — SODIUM CHLORIDE 0.9 % IV SOLN
Freq: Once | INTRAVENOUS | Status: AC
  Administered 2017-02-17: 21:00:00 via INTRAVENOUS

## 2017-02-17 MED ORDER — ALBUMIN HUMAN 5 % IV SOLN
12.5000 g | Freq: Once | INTRAVENOUS | Status: AC
  Administered 2017-02-17: 12.5 g via INTRAVENOUS

## 2017-02-17 MED ORDER — AMIODARONE LOAD VIA INFUSION
150.0000 mg | Freq: Once | INTRAVENOUS | Status: AC
  Administered 2017-02-17: 150 mg via INTRAVENOUS
  Filled 2017-02-17: qty 83.34

## 2017-02-17 MED ORDER — AMIODARONE HCL IN DEXTROSE 360-4.14 MG/200ML-% IV SOLN
30.0000 mg/h | INTRAVENOUS | Status: DC
  Administered 2017-02-18 – 2017-02-19 (×3): 30 mg/h via INTRAVENOUS
  Filled 2017-02-17 (×3): qty 200

## 2017-02-17 MED ORDER — ALBUMIN HUMAN 5 % IV SOLN
12.5000 g | Freq: Once | INTRAVENOUS | Status: AC
  Administered 2017-02-17: 12.5 g via INTRAVENOUS
  Filled 2017-02-17: qty 250

## 2017-02-17 NOTE — Evaluation (Signed)
Physical Therapy Evaluation Patient Details Name: Vincent Mcclure MRN: [id number] DOB: 1932/02/05 Today's Date: 02/17/2017   History of Present Illness  Pt adm with chest pain and found to have . Pt s/p CABG x 3 on 5/8.  PMH - HTN, GI bleed, PAD, peripheral neuropathy, ETOH abuse, back surgery  Clinical Impression  Pt admitted with above diagnosis and presents to PT with functional limitations due to deficits listed below (See PT problem list). Pt needs skilled PT to maximize independence and safety to allow discharge to ST-SNF. Pt may progress rapidly and be able to return home with wife but wife is hesitant that she can manage pt at home. Pt with episode of decr responsiveness and BLE shaking on first standing. Returned to sitting with BP of 96/52. Returned to standing with BP 86/46. After 3 minutes standing 85/48. Amb with pt c/o of some dizziness after 150'. Had pt sit in recliner and brought back to room. BP in sitting 81/52.      Follow Up Recommendations SNF (If progresses rapidly may be able to go home with wife.)    Equipment Recommendations  None recommended by PT    Recommendations for Other Services OT consult     Precautions / Restrictions Precautions Precautions: Sternal;Fall Precaution Comments: watch orthostatics      Mobility  Bed Mobility Overal bed mobility: Needs Assistance Bed Mobility: Rolling;Sidelying to Sit Rolling: Mod assist Sidelying to sit: Mod assist       General bed mobility comments: Verbal cues for technique. Assist to bring shoulders over and to elevate trunk into sitting.  Transfers Overall transfer level: Needs assistance Equipment used:  (EVA walker) Transfers: Sit to/from Stand Sit to Stand: +2 physical assistance;Min assist         General transfer comment: Assist to bring hips up and for balance. When pt first stood after 1 minute he began having shaking of BLE's and became less responsive. Assist to rapid descent to bed. Pt able  to continue to interact. BP checked 96/52.   Ambulation/Gait Ambulation/Gait assistance: Min assist;+2 safety/equipment (nurse followed with chair due to low BP) Ambulation Distance (Feet): 100 Feet Assistive device:  (EVA walker) Gait Pattern/deviations: Step-through pattern;Decreased stride length;Trunk flexed Gait velocity: decr Gait velocity interpretation: Below normal speed for age/gender General Gait Details: Assist for balance and stability. Amb on 2L of O2 with SpO2>96% and HR stable.   Stairs            Wheelchair Mobility    Modified Rankin (Stroke Patients Only)       Balance Overall balance assessment: Needs assistance Sitting-balance support: No upper extremity supported;Feet supported Sitting balance-Leahy Scale: Fair     Standing balance support: Bilateral upper extremity supported Standing balance-Leahy Scale: Poor Standing balance comment: Eva walker and min assist                             Pertinent Vitals/Pain Pain Assessment: No/denies pain    Home Living Family/patient expects to be discharged to:: Private residence Living Arrangements: Spouse/significant other Available Help at Discharge: Family;Available 24 hours/day Type of Home: House Home Access: Ramped entrance     Home Layout: One level Home Equipment: Walker - 2 wheels;Cane - single point      Prior Function Level of Independence: Independent with assistive device(s)         Comments: Used cane at times     Hand Dominance  Extremity/Trunk Assessment   Upper Extremity Assessment Upper Extremity Assessment: Generalized weakness    Lower Extremity Assessment Lower Extremity Assessment: Generalized weakness       Communication   Communication: HOH  Cognition Arousal/Alertness: Awake/alert Behavior During Therapy: WFL for tasks assessed/performed Overall Cognitive Status: Within Functional Limits for tasks assessed                                         General Comments      Exercises     Assessment/Plan    PT Assessment Patient needs continued PT services  PT Problem List Decreased strength;Decreased activity tolerance;Decreased balance;Decreased mobility;Decreased knowledge of use of DME;Decreased knowledge of precautions;Cardiopulmonary status limiting activity       PT Treatment Interventions DME instruction;Gait training;Functional mobility training;Therapeutic activities;Therapeutic exercise;Balance training;Patient/family education    PT Goals (Current goals can be found in the Care Plan section)  Acute Rehab PT Goals Patient Stated Goal: go home PT Goal Formulation: With patient/family Time For Goal Achievement: 02/24/17 Potential to Achieve Goals: Good    Frequency Min 3X/week   Barriers to discharge        Co-evaluation               AM-PAC PT "6 Clicks" Daily Activity  Outcome Measure Difficulty turning over in bed (including adjusting bedclothes, sheets and blankets)?: Total Difficulty moving from lying on back to sitting on the side of the bed? : Total Difficulty sitting down on and standing up from a chair with arms (e.g., wheelchair, bedside commode, etc,.)?: Total Help needed moving to and from a bed to chair (including a wheelchair)?: A Little Help needed walking in hospital room?: A Little Help needed climbing 3-5 steps with a railing? : Total 6 Click Score: 10    End of Session Equipment Utilized During Treatment: Gait belt;Oxygen Activity Tolerance: Treatment limited secondary to medical complications (Comment) (Pt with low BP) Patient left: in chair;with call bell/phone within reach;with family/visitor present Nurse Communication: Mobility status;Other (comment) (Low BP) PT Visit Diagnosis: Muscle weakness (generalized) (M62.81);Difficulty in walking, not elsewhere classified (R26.2)    Time: 3893-7342 PT Time Calculation (min) (ACUTE ONLY): 27  min   Charges:   PT Evaluation $PT Eval Moderate Complexity: 1 Procedure PT Treatments $Gait Training: 8-22 mins   PT G Codes:        [facility] PT [place] 02/17/2017, 2:30 PM

## 2017-02-17 NOTE — Progress Notes (Signed)
PT Cancellation Note  Patient Details Name: Vincent Mcclure MRN: [id number] DOB: [birth year]   Cancelled Treatment:    Reason Eval/Treat Not Completed: Other (comment). Pt had already amb x 2 this AM. Will see later today.   [place] 02/17/2017, 11:23 AM Suanne Marker PT [phone]

## 2017-02-17 NOTE — Progress Notes (Signed)
Patient ID: TANVIR HIPPLE, male   DOB: 07/12/1932, 83 y.o.   MRN: [id number] TCTS DAILY ICU PROGRESS NOTE                   [address]            [place] [address]          [phone]   2 Days Post-Op Procedure(s) (LRB): CORONARY ARTERY BYPASS GRAFTING times three  with left internal mammary harvest and endoscopic harvest of Right SVG. Grafts of LIMA to  LAD, SVG to Distal Circ, and to First Diag. (N/A) TRANSESOPHAGEAL ECHOCARDIOGRAM (TEE) (N/A)  Total Length of Stay:  LOS: 5 days   Subjective: Walked in unit today, alert and conversant   Objective: Vital signs in last 24 hours: Temp:  [97.7 F (36.5 C)-98.6 F (37 C)] 98.3 F (36.8 C) (05/10 0737) Pulse Rate:  [89-108] 102 (05/10 0700) Cardiac Rhythm: Atrial paced;Normal sinus rhythm (05/10 0754) Resp:  [13-28] 23 (05/10 0700) BP: (78-136)/(49-120) 100/52 (05/10 0700) SpO2:  [75 %-100 %] 91 % (05/10 0700) Arterial Line BP: (109-120)/(56-62) 120/62 (05/09 0830) Weight:  [215 lb 6.2 oz (97.7 kg)] 215 lb 6.2 oz (97.7 kg) (05/10 0500)  Filed Weights   02/15/17 1424 02/16/17 0500 02/17/17 0500  Weight: 207 lb 10.8 oz (94.2 kg) 216 lb 9.6 oz (98.2 kg) 215 lb 6.2 oz (97.7 kg)    Weight change: 7 lb 11.5 oz (3.5 kg)   Hemodynamic parameters for last 24 hours: PAP: (37-43)/(22-26) 43/26  Intake/Output from previous day: 05/09 0701 - 05/10 0700 In: 1815.3 [P.O.:900; I.V.:865.3; IV Piggyback:50] Out: 6759 [Urine:765; Emesis/NG output:250; Chest Tube:230]  Intake/Output this shift: No intake/output data recorded.  Current Meds: Scheduled Meds: . acetaminophen  1,000 mg Oral Q6H   Or  . acetaminophen (TYLENOL) oral liquid 160 mg/5 mL  1,000 mg Per Tube Q6H  . aspirin  81 mg Per Tube Daily   Or  . aspirin EC  81 mg Oral Daily  . atorvastatin  80 mg Oral q1800  . bisacodyl  10 mg Oral Daily   Or  . bisacodyl  10 mg Rectal Daily  . docusate sodium  200 mg Oral Daily  . enoxaparin (LOVENOX) injection   30 mg Subcutaneous QHS  . insulin aspart  0-24 Units Subcutaneous Q4H  . insulin detemir  10 Units Subcutaneous Daily  . mouth rinse  15 mL Mouth Rinse BID  . metoCLOPramide (REGLAN) injection  5 mg Intravenous Q8H  . metoprolol tartrate  12.5 mg Oral BID   Or  . metoprolol tartrate  12.5 mg Per Tube BID  . pantoprazole  40 mg Oral Daily  . sodium chloride flush  3 mL Intravenous Q12H   Continuous Infusions: . sodium chloride Stopped (02/16/17 0855)  . sodium chloride    . sodium chloride    . dexmedetomidine (PRECEDEX) IV infusion Stopped (02/15/17 1636)  . DOPamine 3 mcg/kg/min (02/17/17 0600)  . lactated ringers    . lactated ringers    . lactated ringers Stopped (02/16/17 1901)  . lactated ringers 20 mL/hr at 02/17/17 0600  . nitroGLYCERIN Stopped (02/16/17 0036)  . phenylephrine (NEO-SYNEPHRINE) Adult infusion Stopped (02/15/17 2148)   PRN Meds:.sodium chloride, lactated ringers, metoprolol, midazolam, ondansetron (ZOFRAN) IV, oxyCODONE, sodium chloride flush, traMADol  General appearance: alert and cooperative Neurologic: intact Heart: regular rate and rhythm, S1, S2 normal, no murmur, click, rub or gallop Lungs: diminished breath sounds bibasilar Abdomen: soft, non-tender;  bowel sounds normal; no masses,  no organomegaly Extremities: extremities normal, atraumatic, no cyanosis or edema and Homans sign is negative, no sign of DVT Wound: sternum stable   Lab Results: CBC: Recent Labs  02/16/17 1604 02/16/17 1610 02/17/17 0424  WBC 11.1*  --  8.9  HGB 8.4* 9.5* 7.7*  HCT 27.9* 28.0* 25.5*  PLT 173  --  154   BMET:  Recent Labs  02/16/17 0430  02/16/17 1610 02/17/17 0424  NA 133*  --  132* 132*  K 4.0  --  4.2 3.9  CL 102  --  96* 100*  CO2 20*  --   --  24  GLUCOSE 122*  --  152* 132*  BUN 13  --  18 21*  CREATININE 1.16  < > 1.70* 2.01*  CALCIUM 8.6*  --   --  8.6*  < > = values in this interval not displayed.  CMET: Lab Results  Component Value  Date   WBC 8.9 02/17/2017   HGB 7.7 (L) 02/17/2017   HCT 25.5 (L) 02/17/2017   PLT 154 02/17/2017   GLUCOSE 132 (H) 02/17/2017   CHOL 145 02/12/2017   TRIG 83 02/12/2017   HDL 43 02/12/2017   LDLDIRECT 133.2 10/14/2011   LDLCALC 85 02/12/2017   ALT 11 (L) 02/14/2017   AST 18 02/14/2017   NA 132 (L) 02/17/2017   K 3.9 02/17/2017   CL 100 (L) 02/17/2017   CREATININE 2.01 (H) 02/17/2017   BUN 21 (H) 02/17/2017   CO2 24 02/17/2017   TSH 1.10 08/11/2016   PSA 0.54 01/24/2015   INR 1.36 02/15/2017   HGBA1C 5.9 (H) 02/14/2017      PT/INR:  Recent Labs  02/15/17 1315  LABPROT 16.9*  INR 1.36   Radiology: No results found.  Acute Kidney Injury (any one)  Increase in SCr by > 0.3 within 48 hours  Increase SCr to > 1.5 times baseline  Urine volume < 0.5 ml/kg/h for 6 hrs  ?Stage 1 - Increase in serum creatinine to 1.5 to 1.9 times baseline, or increase in serum creatinine by ?0.3 mg/dL (?26.5 micromol/L), or reduction in urine output to <0.5 mL/kg per hour for 6 to 12 hours.  ?Stage 2 - Increase in serum creatinine to 2.0 to 2.9 times baseline, or reduction in urine output to <0.5 mL/kg per hour for ?12 hours.  ?Stage 3 - Increase in serum creatinine to 3.0 times baseline, or increase in serum creatinine to ?4.0 mg/dL (?353.6 micromol/L), or reduction in urine output to <0.3 mL/kg per hour for ?24 hours, or anuria for ?12 hours, or the initiation of renal replacement therapy, or, in patients <18 years, decrease in eGFR to <35 mL   Lab Results  Component Value Date   CREATININE 2.01 (H) 02/17/2017   Estimated Creatinine Clearance: 33.2 mL/min (A) (by C-G formula based on SCr of 2.01 mg/dL (H)).  COX 45 this am, on low dose dopamine   Assessment/Plan: S/P Procedure(s) (LRB): CORONARY ARTERY BYPASS GRAFTING times three  with left internal mammary harvest and endoscopic harvest of Right SVG. Grafts of LIMA to  LAD, SVG to Distal Circ, and to First Diag.  (N/A) TRANSESOPHAGEAL ECHOCARDIOGRAM (TEE) (N/A) Mobilize Diuresis Diabetes control d/c tubes/lines Stage 1 aki, cr 2.01  Keep foley to monitor uop Continue dopamine    Grace Isaac 02/17/2017 8:01 AM

## 2017-02-17 NOTE — Progress Notes (Signed)
UO <70m/hr x2 hr; Last creatinine 1.75; H/H stable; VSS;  Lasix '40mg'$  given at 1[place] MD notified. Verbal order given: administer 1 albumin; monitor UO; anticipate low UO for the remainder of the night.  Will continue to monitor  SClyda HurdleRN

## 2017-02-17 NOTE — Progress Notes (Signed)
Patient ID: Vincent Mcclure, male   DOB: 27-Feb-1932, 83 y.o.   MRN: [id number]  SICU Evening Rounds:   Hemodynamically labile. Dropped BP into the 60's this pm but responded to albumin. On dop 4  Went into atrial fib with RVR and started on amio. Now sinus 80's     Urine output marginal and creat jumped up to 2.01 this am from normal preop.    Awake and alert, no complaints  CBC    Component Value Date/Time   WBC 7.7 02/17/2017 1947   RBC 3.15 (L) 02/17/2017 1947   HGB 7.6 (L) 02/17/2017 1947   HCT 23.7 (L) 02/17/2017 1947   PLT 144 (L) 02/17/2017 1947   MCV 75.2 (L) 02/17/2017 1947   MCH 24.1 (L) 02/17/2017 1947   MCHC 32.1 02/17/2017 1947   RDW 17.8 (H) 02/17/2017 1947   LYMPHSABS 1.2 09/01/2016 1358   MONOABS 0.5 09/01/2016 1358   EOSABS 0.1 09/01/2016 1358   BASOSABS 0.0 09/01/2016 1358     BMET    Component Value Date/Time   NA 132 (L) 02/17/2017 0424   K 3.9 02/17/2017 0424   CL 100 (L) 02/17/2017 0424   CO2 24 02/17/2017 0424   GLUCOSE 132 (H) 02/17/2017 0424   BUN 21 (H) 02/17/2017 0424   CREATININE 2.01 (H) 02/17/2017 0424   CALCIUM 8.6 (L) 02/17/2017 0424   GFRNONAA 29 (L) 02/17/2017 0424   GFRAA 34 (L) 02/17/2017 0424     A/P:  He has borderline BP, postop atrial fib, acute postop renal failure with oliguria and Hgb 7.6. Co-ox 45. Will transfuse 1 unit of PRBC's tonight. Repeat Hgb in am. May need more. Discussed with pt.

## 2017-02-17 NOTE — Plan of Care (Signed)
Problem: Activity: Goal: Risk for activity intolerance will decrease Outcome: Progressing Ambulate 2x   Problem: Fluid Volume: Goal: Ability to maintain a balanced intake and output will improve Outcome: Not Progressing UO low  Problem: Nutrition: Goal: Adequate nutrition will be maintained Outcome: Not Progressing Persistent nausea and vomitting

## 2017-02-18 ENCOUNTER — Inpatient Hospital Stay (HOSPITAL_COMMUNITY): Payer: Medicare HMO

## 2017-02-18 DIAGNOSIS — I480 Paroxysmal atrial fibrillation: Secondary | ICD-10-CM

## 2017-02-18 DIAGNOSIS — Z951 Presence of aortocoronary bypass graft: Secondary | ICD-10-CM

## 2017-02-18 LAB — CBC
HCT: 27.9 % — ABNORMAL LOW (ref 39.0–52.0)
HCT: 29.3 % — ABNORMAL LOW (ref 39.0–52.0)
Hemoglobin: 9.1 g/dL — ABNORMAL LOW (ref 13.0–17.0)
Hemoglobin: 9.4 g/dL — ABNORMAL LOW (ref 13.0–17.0)
MCH: 25 pg — ABNORMAL LOW (ref 26.0–34.0)
MCH: 24.1 pg — ABNORMAL LOW (ref 26.0–34.0)
MCHC: 32.6 g/dL (ref 30.0–36.0)
MCHC: 32.1 g/dL (ref 30.0–36.0)
MCV: 76.6 fL — ABNORMAL LOW (ref 78.0–100.0)
MCV: 75.1 fL — ABNORMAL LOW (ref 78.0–100.0)
Platelets: 138 10*3/uL — ABNORMAL LOW (ref 150–400)
Platelets: 178 10*3/uL (ref 150–400)
RBC: 3.64 MIL/uL — ABNORMAL LOW (ref 4.22–5.81)
RBC: 3.9 MIL/uL — ABNORMAL LOW (ref 4.22–5.81)
RDW: 17.6 % — ABNORMAL HIGH (ref 11.5–15.5)
RDW: 17.2 % — ABNORMAL HIGH (ref 11.5–15.5)
WBC: 7.4 10*3/uL (ref 4.0–10.5)
WBC: 6.5 10*3/uL (ref 4.0–10.5)

## 2017-02-18 LAB — COMPREHENSIVE METABOLIC PANEL
ALT: 12 U/L — ABNORMAL LOW (ref 17–63)
AST: 20 U/L (ref 15–41)
Albumin: 3.1 g/dL — ABNORMAL LOW (ref 3.5–5.0)
Alkaline Phosphatase: 63 U/L (ref 38–126)
Anion gap: 8 (ref 5–15)
BUN: 31 mg/dL — ABNORMAL HIGH (ref 6–20)
CO2: 23 mmol/L (ref 22–32)
Calcium: 8.2 mg/dL — ABNORMAL LOW (ref 8.9–10.3)
Chloride: 99 mmol/L — ABNORMAL LOW (ref 101–111)
Creatinine, Ser: 2.7 mg/dL — ABNORMAL HIGH (ref 0.61–1.24)
GFR calc Af Amer: 24 mL/min — ABNORMAL LOW (ref >60)
GFR calc non Af Amer: 20 mL/min — ABNORMAL LOW (ref >60)
Glucose, Bld: 98 mg/dL (ref 65–99)
Potassium: 3.2 mmol/L — ABNORMAL LOW (ref 3.5–5.1)
Sodium: 130 mmol/L — ABNORMAL LOW (ref 135–145)
Total Bilirubin: 0.6 mg/dL (ref 0.3–1.2)
Total Protein: 5.5 g/dL — ABNORMAL LOW (ref 6.5–8.1)

## 2017-02-18 LAB — BPAM RBC
Blood Product Expiration Date: 201805282359
Blood Product Expiration Date: 201805282359
ISSUE DATE / TIME: 201805102105
ISSUE DATE / TIME: 201805110354
Unit Type and Rh: 9500
Unit Type and Rh: 9500

## 2017-02-18 LAB — TYPE AND SCREEN
ABO/RH(D): O NEG
Antibody Screen: NEGATIVE
Unit division: 0
Unit division: 0

## 2017-02-18 LAB — GLUCOSE, CAPILLARY
Glucose-Capillary: 93 mg/dL (ref 65–99)
Glucose-Capillary: 101 mg/dL — ABNORMAL HIGH (ref 65–99)
Glucose-Capillary: 155 mg/dL — ABNORMAL HIGH (ref 65–99)
Glucose-Capillary: 107 mg/dL — ABNORMAL HIGH (ref 65–99)
Glucose-Capillary: 146 mg/dL — ABNORMAL HIGH (ref 65–99)

## 2017-02-18 LAB — BASIC METABOLIC PANEL
Anion gap: 10 (ref 5–15)
BUN: 32 mg/dL — ABNORMAL HIGH (ref 6–20)
CO2: 21 mmol/L — ABNORMAL LOW (ref 22–32)
Calcium: 8.2 mg/dL — ABNORMAL LOW (ref 8.9–10.3)
Chloride: 96 mmol/L — ABNORMAL LOW (ref 101–111)
Creatinine, Ser: 2.44 mg/dL — ABNORMAL HIGH (ref 0.61–1.24)
GFR calc Af Amer: 27 mL/min — ABNORMAL LOW (ref >60)
GFR calc non Af Amer: 23 mL/min — ABNORMAL LOW (ref >60)
Glucose, Bld: 209 mg/dL — ABNORMAL HIGH (ref 65–99)
Potassium: 3.4 mmol/L — ABNORMAL LOW (ref 3.5–5.1)
Sodium: 127 mmol/L — ABNORMAL LOW (ref 135–145)

## 2017-02-18 LAB — COOXEMETRY PANEL
Carboxyhemoglobin: 1.2 % (ref 0.5–1.5)
Methemoglobin: 1 % (ref 0.0–1.5)
O2 Saturation: 59.3 %
Total hemoglobin: 9.1 g/dL — ABNORMAL LOW (ref 12.0–16.0)

## 2017-02-18 LAB — PREPARE RBC (CROSSMATCH)

## 2017-02-18 MED ORDER — SODIUM CHLORIDE 0.9% FLUSH
10.0000 mL | Freq: Two times a day (BID) | INTRAVENOUS | Status: DC
  Administered 2017-02-18 – 2017-02-24 (×10): 10 mL

## 2017-02-18 MED ORDER — CHLORHEXIDINE GLUCONATE CLOTH 2 % EX PADS
6.0000 | MEDICATED_PAD | Freq: Every day | CUTANEOUS | Status: DC
  Administered 2017-02-18 – 2017-02-23 (×5): 6 via TOPICAL

## 2017-02-18 MED ORDER — FUROSEMIDE 10 MG/ML IJ SOLN
160.0000 mg | Freq: Once | INTRAVENOUS | Status: AC
  Administered 2017-02-18: 160 mg via INTRAVENOUS
  Filled 2017-02-18: qty 10

## 2017-02-18 MED ORDER — SODIUM CHLORIDE 0.9% FLUSH
10.0000 mL | INTRAVENOUS | Status: DC | PRN
  Administered 2017-02-22: 10 mL
  Filled 2017-02-18: qty 40

## 2017-02-18 MED ORDER — POTASSIUM CHLORIDE 10 MEQ/50ML IV SOLN
10.0000 meq | Freq: Once | INTRAVENOUS | Status: AC
  Administered 2017-02-18: 10 meq via INTRAVENOUS
  Filled 2017-02-18: qty 50

## 2017-02-18 MED ORDER — SODIUM CHLORIDE 0.9 % IV SOLN
Freq: Once | INTRAVENOUS | Status: AC
  Administered 2017-02-18: 03:00:00 via INTRAVENOUS

## 2017-02-18 MED ORDER — POTASSIUM CHLORIDE 10 MEQ/50ML IV SOLN
10.0000 meq | INTRAVENOUS | Status: AC
  Administered 2017-02-18 (×2): 10 meq via INTRAVENOUS
  Filled 2017-02-18 (×2): qty 50

## 2017-02-18 MED ORDER — FUROSEMIDE 10 MG/ML IJ SOLN
160.0000 mg | Freq: Two times a day (BID) | INTRAMUSCULAR | Status: DC
  Administered 2017-02-18 – 2017-02-20 (×4): 160 mg via INTRAVENOUS
  Filled 2017-02-18 (×4): qty 16

## 2017-02-18 NOTE — Progress Notes (Signed)
Peripherally Inserted Central Catheter/Midline Placement  The IV Nurse has discussed with the patient and/or persons authorized to consent for the patient, the purpose of this procedure and the potential benefits and risks involved with this procedure.  The benefits include less needle sticks, lab draws from the catheter, and the patient may be discharged home with the catheter. Risks include, but not limited to, infection, bleeding, blood clot (thrombus formation), and puncture of an artery; nerve damage and irregular heartbeat and possibility to perform a PICC exchange if needed/ordered by physician.  Alternatives to this procedure were also discussed.  B[employer] Power PICC patient education guide, fact sheet on infection prevention and patient information card has been provided to patient /or left at bedside.    PICC/Midline Placement Documentation        Darlyn Read 02/18/2017, 9:53 AM

## 2017-02-18 NOTE — Progress Notes (Signed)
Progress Note  Patient Name: Vincent Mcclure Date of Encounter: 02/18/2017  Primary Cardiologist: Dr. Meda Coffee  Subjective   Denies any dyspnea or palpitations. Having mild sternal pain and nausea (no active vomiting since last night).   Inpatient Medications    Scheduled Meds: . acetaminophen  1,000 mg Oral Q6H   Or  . acetaminophen (TYLENOL) oral liquid 160 mg/5 mL  1,000 mg Per Tube Q6H  . aspirin  81 mg Per Tube Daily   Or  . aspirin EC  81 mg Oral Daily  . atorvastatin  80 mg Oral q1800  . bisacodyl  10 mg Oral Daily   Or  . bisacodyl  10 mg Rectal Daily  . Chlorhexidine Gluconate Cloth  6 each Topical Daily  . docusate sodium  200 mg Oral Daily  . enoxaparin (LOVENOX) injection  30 mg Subcutaneous QHS  . insulin aspart  0-24 Units Subcutaneous Q4H  . insulin detemir  10 Units Subcutaneous Daily  . metoprolol tartrate  12.5 mg Oral BID   Or  . metoprolol tartrate  12.5 mg Per Tube BID  . pantoprazole  40 mg Oral Daily  . sodium chloride flush  10-40 mL Intracatheter Q12H  . sodium chloride flush  3 mL Intravenous Q12H   Continuous Infusions: . sodium chloride Stopped (02/16/17 0855)  . sodium chloride    . sodium chloride    . amiodarone 30 mg/hr (02/18/17 1000)  . dexmedetomidine (PRECEDEX) IV infusion Stopped (02/15/17 1636)  . DOPamine 4 mcg/kg/min (02/18/17 1000)  . lactated ringers    . lactated ringers    . lactated ringers Stopped (02/16/17 1901)  . lactated ringers 20 mL/hr at 02/18/17 1000  . nitroGLYCERIN Stopped (02/16/17 0036)  . phenylephrine (NEO-SYNEPHRINE) Adult infusion 20 mcg/min (02/18/17 1000)   PRN Meds: sodium chloride, lactated ringers, metoprolol, midazolam, ondansetron (ZOFRAN) IV, oxyCODONE, sodium chloride flush, sodium chloride flush, traMADol   Vital Signs    Vitals:   02/18/17 0800 02/18/17 0822 02/18/17 0900 02/18/17 1000  BP: (!) 87/59 (!) 84/54 109/78 (!) 91/58  Pulse: 95 (!) 101 96 80  Resp: 20 (!) 23 20 (!) 23  Temp:   97.7 F (36.5 C)    TempSrc:  Oral    SpO2: 99% 99% (!) 88% 94%  Weight:      Height:        Intake/Output Summary (Last 24 hours) at 02/18/17 1103 Last data filed at 02/18/17 1000  Gross per 24 hour  Intake          2876.43 ml  Output              425 ml  Net          2451.43 ml   Filed Weights   02/16/17 0500 02/17/17 0500 02/18/17 0500  Weight: 216 lb 9.6 oz (98.2 kg) 215 lb 6.2 oz (97.7 kg) 214 lb 4.6 oz (97.2 kg)    Telemetry    Atrial fibrillation with HR in the 90's to low-100's.  - Personally Reviewed  ECG    02/16/17: NSR, HR 77. No acute ST or T-wave changes.  - Personally Reviewed  Physical Exam   General: Well developed, well nourished Caucasian male appearing in no acute distress. Head: Normocephalic, atraumatic.  Neck: Supple without bruits, JVD not elevated. Lungs:  Resp regular and unlabored, decreased breath sounds along left base. Heart: Irregularly irregular, S1, S2, no S3, S4, or murmur; no rub. Abdomen: Soft, non-tender, non-distended with normoactive bowel  sounds. No hepatomegaly. No rebound/guarding. No obvious abdominal masses. Extremities: No clubbing or cyanosis. Trace lower extremity edema. Distal pedal pulses are 2+ bilaterally. Neuro: Alert and oriented X 3. Moves all extremities spontaneously. Psych: Normal affect.  Labs    Chemistry Recent Labs Lab 02/14/17 1438  02/17/17 0424 02/17/17 1947 02/18/17 0434  NA 132*  < > 132* 130* 130*  K 3.7  < > 3.9 3.4* 3.2*  CL 102  < > 100* 99* 99*  CO2 22  < > '24 22 23  '$ GLUCOSE 100*  < > 132* 159* 98  BUN 14  < > 21* 28* 31*  CREATININE 1.18  < > 2.01* 2.55* 2.70*  CALCIUM 8.5*  < > 8.6* 8.3* 8.2*  PROT 5.7*  --   --   --  5.5*  ALBUMIN 3.4*  --   --   --  3.1*  AST 18  --   --   --  20  ALT 11*  --   --   --  12*  ALKPHOS 64  --   --   --  63  BILITOT 0.5  --   --   --  0.6  GFRNONAA 55*  < > 29* 22* 20*  GFRAA >60  < > 34* 25* 24*  ANIONGAP 8  < > '8 9 8  '$ < > = values in this interval  not displayed.   Hematology Recent Labs Lab 02/17/17 0424 02/17/17 1947 02/18/17 0434  WBC 8.9 7.7 7.4  RBC 3.39* 3.15* 3.64*  HGB 7.7* 7.6* 9.1*  HCT 25.5* 23.7* 27.9*  MCV 75.2* 75.2* 76.6*  MCH 22.7* 24.1* 25.0*  MCHC 30.2 32.1 32.6  RDW 17.4* 17.8* 17.6*  PLT 154 144* 138*    Cardiac Enzymes Recent Labs Lab 02/12/17 0008 02/12/17 0657 02/12/17 1321 02/13/17 0922  TROPONINI 1.04* 2.21* 2.24* 1.03*    Recent Labs Lab 02/11/17 1617  TROPIPOC 0.03     BNPNo results for input(s): BNP, PROBNP in the last 168 hours.   DDimer No results for input(s): DDIMER in the last 168 hours.   Radiology    Dg Chest Port 1 View  Result Date: 02/18/2017 CLINICAL DATA:  Status post chest tube removal EXAM: PORTABLE CHEST 1 VIEW COMPARISON:  Feb 17, 2017 FINDINGS: Chest tube is been removed on the left. The left apical pneumothorax appears slightly larger than 1 day prior. No tension component. There is consolidation in the left base with a small left pleural effusion Right lung is clear. Heart is mildly enlarged with pulmonary vascular within normal limits. Central catheter tip is in the superior vena cava, stable. There is aortic atherosclerosis. No bone lesions. IMPRESSION: Pneumothorax on the left slightly larger after chest tube removal. No tension component. Persistent consolidation left base with small left pleural effusion. This appearance is somewhat 1 day prior. Right lung clear. Stable cardiomegaly. There is aortic atherosclerosis. These results will be called to the ordering clinician or representative by the Radiologist Assistant, and communication documented in the PACS or zVision Dashboard. Electronically Signed   By: Lowella Grip III M.D.   On: 02/18/2017 08:05   Dg Chest Port 1 View  Result Date: 02/17/2017 CLINICAL DATA:  Chest tube EXAM: PORTABLE CHEST 1 VIEW COMPARISON:  Yesterday FINDINGS: Swan-Ganz catheter and mediastinal drain have been removed. Left-sided chest  tube present, shorter than yesterday. Small left apical pneumothorax, 2 rib interspaces. Minor atelectasis at the left base. Stable postoperative heart size. No pulmonary edema.  IMPRESSION: 1. Less than 10% left apical pneumothorax. Left chest tube has been shortened from yesterday. 2. Mild left base atelectasis. Electronically Signed   By: Monte Fantasia M.D.   On: 02/17/2017 08:06    Cardiac Studies   Cardiac Catheterization: 02/14/2017  Heavily calcified left main and proximal to mid LAD.  Eccentric 75% distal left main  Mid LAD [place] 0, 1, 1 bifurcation stenosis with 90% LAD and 75% second diagonal.  Subtotal occlusion, 99%, of the moderate sized first diagonal, possibly the source of the patient's presentation.  Ostial 80% circumflex. The distal circumflex is relatively small with 2 small to intermediate size branches distally.  Mid to distal 70% PDA. The right coronary is dominant and otherwise widely patent.  Mild hypokinesis mid anterior wall, likely due to occlusion of the first diagonal. Overall LV function is normal with EF 55%. Normal left ventricular filling pressures.  Recommendations:   Surgical consultation via TCTS for consideration of bypass surgery due to left main coronary disease and complex mid LAD stenoses.  Echocardiogram: 02/14/2017 Study Conclusions  - Left ventricle: The cavity size was mildly dilated. Systolic   function was normal. The estimated ejection fraction was in the   range of 55% to 60%. Wall motion was normal; there were no   regional wall motion abnormalities. Left ventricular diastolic   function parameters were normal. - Aortic valve: Poorly visualized. Trileaflet; mildly thickened,   mildly calcified leaflets.  Patient Profile     83 y.o. male with PMH of HTN, HLD, GERD, ETOH use, and prior tobacco use who presented to [facility] ED on 02/12/2017 for evaluation of chest pain. Found to have an NSTEMI with peak troponin of 2.24. Cath  showed significant LM and LAD disease. S/p CABG on 02/15/2017. Cardiology asked to see again on 02/18/2017 for post-operative atrial fibrillation.   Assessment & Plan    1. NSTEMI/ CAD - presented for evaluation of new-onset chest pain. Initial troponin 1.04 with peak value of 2.24. Cath on 02/14/2017 showed 3V CAD including 75% LM stenosis. CT Surgery was consulted and he went for CABG on 5/8 with LIMA-LAD, reverse SVG-D1, and SVG-dLCx. Has experienced episodes of hypotension post-op, requiring Dopamine. Ambulating around the unit with therapy.   - continue ASA, statin, and BB. With his ACS event, should be on DAPT but will monitor for improvement of his anemia.   2. Post-operative Atrial Fibrillation - noted to have gone into atrial fibrillation with RVR on 02/17/2017 with spontaneous conversion to NSR.  - currently on IV Amiodarone 30 mg/hr. HR in the 90's to low-100's. BP hinders the addition of other rate-controlling medications at this time.  - This patients CHA2DS2-VASc Score and unadjusted Ischemic Stroke Rate (% per year) is equal to 4.8 % stroke rate/year from a score of 4 (HTN, Vascular, Age (2). If just an isolated event of post-operative atrial fibrillation, would not require long-term anticoagulation, but he reports a history of an "irregular heart-rate". Consider an event monitor in the outpatient setting to further assess for episodes of PAF.  3. History of GIB/Post-operative anemia - last admit for GIB was in 2016. EGD showed gastritis/ gastric ulcers, in the setting of chronic NSAIDSq and ETOH. He denies any recent melena. Hgb 10.9 on admit. - Hgb down to 7.6 on 5/10. Transfused 2 units pRBCs with Hgb at 9.1 this AM.   4. ETOH Abuse - admits to drinking 6/7 days a week (beer and vodka) prior to admission. - on CIWA protocol.  5. Hypotension - BP variable at 60/47 - 152/133 in the past 24 hours.  - Requiring Dopamine. Continue to hold Lopressor for SBP < 100.  6. HLD - Lipid  panel this admission shows total cholesterol 145, HDL 43, and LDL 85. - continue Lipitor '80mg'$  daily. LDL goal of < 70 with known CAD.   Weston Brass Erma Heritage , PA-C 11:03 AM 02/18/2017 Pager: [phone]

## 2017-02-18 NOTE — Progress Notes (Signed)
Patient ID: EKAM BESSON, male   DOB: [birth year], 83 y.o.   MRN: [id number] TCTS DAILY ICU PROGRESS NOTE                   [address]            ,[place] [address]          [phone]   3 Days Post-Op Procedure(s) (LRB): CORONARY ARTERY BYPASS GRAFTING times three  with left internal mammary harvest and endoscopic harvest of Right SVG. Grafts of LIMA to  LAD, SVG to Distal Circ, and to First Diag. (N/A) TRANSESOPHAGEAL ECHOCARDIOGRAM (TEE) (N/A)  Total Length of Stay:  LOS: 6 days   Subjective:  developed afib, flutter last pm, bp low and was given 2 units PRBC  Objective: Vital signs in last 24 hours: Temp:  [97.7 F (36.5 C)-98.7 F (37.1 C)] 98.2 F (36.8 C) (05/11 0400) Pulse Rate:  [40-108] 98 (05/11 0700) Cardiac Rhythm: Atrial fibrillation (05/11 0600) Resp:  [16-30] 19 (05/11 0700) BP: (60-152)/(47-133) 101/66 (05/11 0700) SpO2:  [91 %-100 %] 96 % (05/11 0700) Weight:  [214 lb 4.6 oz (97.2 kg)] 214 lb 4.6 oz (97.2 kg) (05/11 0500)  Filed Weights   02/16/17 0500 02/17/17 0500 02/18/17 0500  Weight: 216 lb 9.6 oz (98.2 kg) 215 lb 6.2 oz (97.7 kg) 214 lb 4.6 oz (97.2 kg)    Weight change: -1 lb 1.6 oz (-0.5 kg)   Hemodynamic parameters for last 24 hours:    Intake/Output from previous day: 05/10 0701 - 05/11 0700 In: 2183.6 [P.O.:720; I.V.:860.3; Blood:603.3] Out: 490 [Urine:490]  Intake/Output this shift: No intake/output data recorded.  Current Meds: Scheduled Meds: . acetaminophen  1,000 mg Oral Q6H   Or  . acetaminophen (TYLENOL) oral liquid 160 mg/5 mL  1,000 mg Per Tube Q6H  . aspirin  81 mg Per Tube Daily   Or  . aspirin EC  81 mg Oral Daily  . atorvastatin  80 mg Oral q1800  . bisacodyl  10 mg Oral Daily   Or  . bisacodyl  10 mg Rectal Daily  . docusate sodium  200 mg Oral Daily  . enoxaparin (LOVENOX) injection  30 mg Subcutaneous QHS  . insulin aspart  0-24 Units Subcutaneous Q4H  . insulin detemir  10 Units  Subcutaneous Daily  . metoprolol tartrate  12.5 mg Oral BID   Or  . metoprolol tartrate  12.5 mg Per Tube BID  . pantoprazole  40 mg Oral Daily  . sodium chloride flush  3 mL Intravenous Q12H   Continuous Infusions: . sodium chloride Stopped (02/16/17 0855)  . sodium chloride    . sodium chloride    . amiodarone 30 mg/hr (02/18/17 0700)  . dexmedetomidine (PRECEDEX) IV infusion Stopped (02/15/17 1636)  . DOPamine 4 mcg/kg/min (02/18/17 0700)  . lactated ringers    . lactated ringers    . lactated ringers Stopped (02/16/17 1901)  . lactated ringers 20 mL/hr at 02/18/17 0700  . nitroGLYCERIN Stopped (02/16/17 0036)  . phenylephrine (NEO-SYNEPHRINE) Adult infusion 10 mcg/min (02/18/17 0700)   PRN Meds:.sodium chloride, lactated ringers, metoprolol, midazolam, ondansetron (ZOFRAN) IV, oxyCODONE, sodium chloride flush, traMADol  General appearance: alert, cooperative, appears stated age and no distress Neurologic: intact Heart: irregularly irregular rhythm and in a flutter Lungs: diminished breath sounds bibasilar Abdomen: soft, non-tender; bowel sounds normal; no masses,  no organomegaly Extremities: extremities normal, atraumatic, no cyanosis or edema and Homans sign is negative, no  sign of DVT Wound: sternum intact  Lab Results: CBC: Recent Labs  02/17/17 1947 02/18/17 0434  WBC 7.7 7.4  HGB 7.6* 9.1*  HCT 23.7* 27.9*  PLT 144* 138*   BMET:  Recent Labs  02/17/17 1947 02/18/17 0434  NA 130* 130*  K 3.4* 3.2*  CL 99* 99*  CO2 22 23  GLUCOSE 159* 98  BUN 28* 31*  CREATININE 2.55* 2.70*  CALCIUM 8.3* 8.2*    CMET: Lab Results  Component Value Date   WBC 7.4 02/18/2017   HGB 9.1 (L) 02/18/2017   HCT 27.9 (L) 02/18/2017   PLT 138 (L) 02/18/2017   GLUCOSE 98 02/18/2017   CHOL 145 02/12/2017   TRIG 83 02/12/2017   HDL 43 02/12/2017   LDLDIRECT 133.2 10/14/2011   LDLCALC 85 02/12/2017   ALT 12 (L) 02/18/2017   AST 20 02/18/2017   NA 130 (L) 02/18/2017   K  3.2 (L) 02/18/2017   CL 99 (L) 02/18/2017   CREATININE 2.70 (H) 02/18/2017   BUN 31 (H) 02/18/2017   CO2 23 02/18/2017   TSH 1.10 08/11/2016   PSA 0.54 01/24/2015   INR 1.36 02/15/2017   HGBA1C 5.9 (H) 02/14/2017      PT/INR:  Recent Labs  02/15/17 1315  LABPROT 16.9*  INR 1.36   Radiology: No results found.  Acute Kidney Injury (any one)  Increase in SCr by > 0.3 within 48 hours  Increase SCr to > 1.5 times baseline  Urine volume < 0.5 ml/kg/h for 6 hrs  ?Stage 1 - Increase in serum creatinine to 1.5 to 1.9 times baseline, or increase in serum creatinine by ?0.3 mg/dL (?26.5 micromol/L), or reduction in urine output to <0.5 mL/kg per hour for 6 to 12 hours.  ?Stage 2 - Increase in serum creatinine to 2.0 to 2.9 times baseline, or reduction in urine output to <0.5 mL/kg per hour for ?12 hours.  ?Stage 3 - Increase in serum creatinine to 3.0 times baseline, or increase in serum creatinine to ?4.0 mg/dL (?353.6 micromol/L), or reduction in urine output to <0.3 mL/kg per hour for ?24 hours, or anuria for ?12 hours, or the initiation of renal replacement therapy, or, in patients <18 years, decrease in eGFR to <35 mL   Lab Results  Component Value Date   CREATININE 2.70 (H) 02/18/2017   Estimated Creatinine Clearance: 24.7 mL/min (A) (by C-G formula based on SCr of 2.7 mg/dL (H)).  Assessment/Plan: S/P Procedure(s) (LRB): CORONARY ARTERY BYPASS GRAFTING times three  with left internal mammary harvest and endoscopic harvest of Right SVG. Grafts of LIMA to  LAD, SVG to Distal Circ, and to First Diag. (N/A) TRANSESOPHAGEAL ECHOCARDIOGRAM (TEE) (N/A) Mobilize Diuresis Diabetes control Continue foley due to strict I&O, patient critically ill and urinary output monitoring Now aflutter, on cordrone Expected Acute  Blood - loss Anemia- given 2 units over past 12 hours h/h now up to 9.1/27.9 Stage acute kidney inj- renal US ordered Place pic , continue dopamine, cordrone  Lasix  challenge  Rapid a pacered, converted to afib from flutter   Grace Isaac 02/18/2017 7:12 AM

## 2017-02-18 NOTE — Progress Notes (Signed)
Paged Dr Cyndia Bent regarding patients blood pressure reading 80s/50s, UOP 5-66m/hr, patient not complaining of feeling any different. Orders received to tranfuse PRBC, and if that doesn't help then to start neo. Will continue to monitor patient.  MLevon Hedger RN

## 2017-02-18 NOTE — Consult Note (Signed)
Vincent Mcclure is an 83 y.o. male referred by Dr Servando Snare   Chief Complaint: ARF HPI: 83yo WM admitted 02/11/17 for CP.  On  02/14/17 underwent ht cath that showed 3v CAD and subsequently underwent CABG on 02/15/17.  He had NL renal fx prior to CAth/CABG with Scr 1 on admission.  One day Post cath Scr 1.03 at which time he had CABG and since then Scr has increased to 2.7 as of today.  He has had episodes of low BP since surgery with SBP frequently 80-90's and on neo and dopamine.  UO has progressively decreased since surgery to 490cc yest.  Past Medical History:  Diagnosis Date  . Alcohol abuse   . Anemia   . Chronic low back pain 10/14/2011  . Colon polyps    hyperplastic (2004, 2010) and adenomatous (1990).    . COLONIC POLYPS, HX OF 03/05/2008  . Esophageal stricture    hx of  . Gastric ulcer   . GERD 03/05/2008  . GERD (gastroesophageal reflux disease) 1994   associated peptic strictures  . HYPERLIPIDEMIA 03/05/2008  . HYPERTENSION 03/05/2008  . Hypertension   . IBS 03/05/2008  . Impaired glucose tolerance 10/12/2011  . Iron deficiency anemia   . LEG CRAMPS 09/02/2010  . PAD (peripheral artery disease) ([facility]) 10/14/2011  . PAD (peripheral artery disease) ([facility]) 2013  . PERIPHERAL EDEMA 09/02/2010  . PERIPHERAL NEUROPATHY 03/05/2008  . Prostatitis    hx of  . VARICOSE VEINS, LOWER EXTREMITIES 06/09/2009    Past Surgical History:  Procedure Laterality Date  . CATARACT EXTRACTION  bilat  . CORONARY ARTERY BYPASS GRAFT N/A 02/15/2017   Procedure: CORONARY ARTERY BYPASS GRAFTING times three  with left internal mammary harvest and endoscopic harvest of Right SVG. Grafts of LIMA to  LAD, SVG to Distal Circ, and to First Diag.;  Surgeon: Grace Isaac, MD;  Location: [facility];  Service: Open Heart Surgery;  Laterality: N/A;  . ESOPHAGOGASTRODUODENOSCOPY N/A 11/14/2014   Procedure: ESOPHAGOGASTRODUODENOSCOPY (EGD);  Surgeon: Jerene Bears, MD;  Location: [facility] ENDOSCOPY;  Service: Endoscopy;  Laterality:  N/A;  . LEFT HEART CATH AND CORONARY ANGIOGRAPHY N/A 02/14/2017   Procedure: Left Heart Cath and Coronary Angiography;  Surgeon: Belva Crome, MD;  Location: [facility] CV LAB;  Service: Cardiovascular;  Laterality: N/A;  . LUMBAR SPINE SURGERY  11/2008   Dr Joya Salm  . TEE WITHOUT CARDIOVERSION N/A 02/15/2017   Procedure: TRANSESOPHAGEAL ECHOCARDIOGRAM (TEE);  Surgeon: Grace Isaac, MD;  Location: [facility];  Service: Open Heart Surgery;  Laterality: N/A;    Family History  Problem Relation Age of Onset  . Cancer Mother        Brain Cancer  . Diabetes Father   . Heart disease Father        CAD  . Hypertension Father   . Stroke Father   . Diabetes Sister   . Stomach cancer Neg Hx   . Pancreatic cancer Neg Hx   . Colon cancer Neg Hx   . Esophageal cancer Neg Hx    Social History:  reports that he quit smoking about 8 years ago. His smoking use included Cigarettes. He has a 62.50 pack-year smoking history. He has quit using smokeless tobacco. His smokeless tobacco use included Chew. He reports that he drinks about 3.6 oz of alcohol per week . He reports that he does not use drugs.  Allergies:  Allergies  Allergen Reactions  . No Known Allergies  Medications Prior to Admission  Medication Sig Dispense Refill  . acetaminophen (TYLENOL) 325 MG tablet Take 2 tablets (650 mg total) by mouth every 6 (six) hours as needed for mild pain (or Fever >/= 101). (Patient taking differently: Take 325-650 mg by mouth every 6 (six) hours as needed for mild pain (or Fever >/= 101). )    . lisinopril-hydrochlorothiazide (PRINZIDE,ZESTORETIC) 20-25 MG tablet Take 1 tablet by mouth daily. 90 tablet 3  . traMADol (ULTRAM) 50 MG tablet Take 1 tablet (50 mg total) by mouth every 6 (six) hours as needed for moderate pain. 60 tablet 1  . Na Sulfate-K Sulfate-Mg Sulf (SUPREP BOWEL PREP KIT) 17.5-3.13-1.6 GM/180ML SOLN Use as directed (Patient not taking: Reported on 09/29/2016) 2 Bottle 0  . pantoprazole  (PROTONIX) 40 MG tablet Take 1 tablet (40 mg total) by mouth 2 (two) times daily. (Patient not taking: Reported on 02/11/2017) 60 tablet 2  . pravastatin (PRAVACHOL) 40 MG tablet Take 1 tablet (40 mg total) by mouth daily. (Patient not taking: Reported on 02/11/2017) 90 tablet 3     Lab Results: UA: benign from 02/14/17  Recent Labs  02/17/17 0424 02/17/17 1947 02/18/17 0434  WBC 8.9 7.7 7.4  HGB 7.7* 7.6* 9.1*  HCT 25.5* 23.7* 27.9*  PLT 154 144* 138*   BMET  Recent Labs  02/17/17 0424 02/17/17 1947 02/18/17 0434  NA 132* 130* 130*  K 3.9 3.4* 3.2*  CL 100* 99* 99*  CO2 24 22 23   GLUCOSE 132* 159* 98  BUN 21* 28* 31*  CREATININE 2.01* 2.55* 2.70*  CALCIUM 8.6* 8.3* 8.2*   LFT  Recent Labs  02/18/17 0434  PROT 5.5*  ALBUMIN 3.1*  AST 20  ALT 12*  ALKPHOS 63  BILITOT 0.6   Dg Chest Port 1 View  Result Date: 02/18/2017 CLINICAL DATA:  Status post chest tube removal EXAM: PORTABLE CHEST 1 VIEW COMPARISON:  Feb 17, 2017 FINDINGS: Chest tube is been removed on the left. The left apical pneumothorax appears slightly larger than 1 day prior. No tension component. There is consolidation in the left base with a small left pleural effusion Right lung is clear. Heart is mildly enlarged with pulmonary vascular within normal limits. Central catheter tip is in the superior vena cava, stable. There is aortic atherosclerosis. No bone lesions. IMPRESSION: Pneumothorax on the left slightly larger after chest tube removal. No tension component. Persistent consolidation left base with small left pleural effusion. This appearance is somewhat 1 day prior. Right lung clear. Stable cardiomegaly. There is aortic atherosclerosis. These results will be called to the ordering clinician or representative by the Radiologist Assistant, and communication documented in the PACS or zVision Dashboard. Electronically Signed   By: Lowella Grip III M.D.   On: 02/18/2017 08:05   Dg Chest Port 1  View  Result Date: 02/17/2017 CLINICAL DATA:  Chest tube EXAM: PORTABLE CHEST 1 VIEW COMPARISON:  Yesterday FINDINGS: Swan-Ganz catheter and mediastinal drain have been removed. Left-sided chest tube present, shorter than yesterday. Small left apical pneumothorax, 2 rib interspaces. Minor atelectasis at the left base. Stable postoperative heart size. No pulmonary edema. IMPRESSION: 1. Less than 10% left apical pneumothorax. Left chest tube has been shortened from yesterday. 2. Mild left base atelectasis. Electronically Signed   By: Monte Fantasia M.D.   On: 02/17/2017 08:06    ROS: N/V when tries to eat No SOB CP over surgical wound + abd distention but no pain No new arthritic CO No vision  changes  PHYSICAL EXAM: Blood pressure 125/68, pulse 86, temperature (P) 97.6 F (36.4 C), temperature source (P) Oral, resp. rate (!) 23, height 5' 11"  (1.803 m), weight 97.2 kg (214 lb 4.6 oz), SpO2 (!) 89 %. HEENT: PERRLA EOMI NECK:Rt central line with introducer LUNGS:decreased BS Lt base CARDIAC:irreg,irreg wo MRG ABD:+ BS NT, mildly distended No HSM EXT:No edema  Rt upper arm PICC line NEURO: CNI, Ox3, no asterixis  Assessment: 1. Nonoliguric ARF secondary to contrast and hypotension post cath/CABG.  UO seems to be increasing from recent lasix dose 2. SP CABG 3. HTN 4. Mild hypokalemia PLAN: 1. Will try to stimulate UO with lasix 2. Daily Scr 3. Discussed with pt that I expect renal fx to recover, it is just a matter of when.  If renal fx cont to worsen it may require temporary HD and he is aware of this and would be willing to do it if needed. 4. Note renal US ordered 5. 2 runs KCL   Yasenia Reedy T 02/18/2017, 12:28 PM

## 2017-02-18 NOTE — Progress Notes (Signed)
TCTS BRIEF SICU PROGRESS NOTE  3 Days Post-Op  S/P Procedure(s) (LRB): CORONARY ARTERY BYPASS GRAFTING times three  with left internal mammary harvest and endoscopic harvest of Right SVG. Grafts of LIMA to  LAD, SVG to Distal Circ, and to First Diag. (N/A) TRANSESOPHAGEAL ECHOCARDIOGRAM (TEE) (N/A)   Stable day AF w/ controlled rate, BP stable Breathing comfortably w/ O2 sats 91-100% on 4 L/min UOP improving after lasix  Plan: Continue current plan.  Input from Nephrology team appreciated  Rexene Alberts, MD 02/18/2017 4:33 PM

## 2017-02-18 NOTE — Progress Notes (Signed)
Physical Therapy Treatment Patient Details Name: Vincent Mcclure MRN: [id number] DOB: [birth year] Today's Date: 02/18/2017    History of Present Illness Pt adm with chest pain and found to have . Pt s/p CABG x 3 on 5/8.  PMH - HTN, GI bleed, PAD, peripheral neuropathy, ETOH abuse, back surgery    PT Comments    Pt continues to have low BP. Pt with supine BP 80's/50's, Sitting BP 80's/50's, standing 70's/50's, standing after 3 minutes 90's/60's. BP after amb 109/78. Pt without c/o's of being light headed. Returned to bed due to PICC team on way to place PICC.   Follow Up Recommendations  SNF (If progresses rapidly may be able to go home with wife.)     Equipment Recommendations  None recommended by PT    Recommendations for Other Services OT consult     Precautions / Restrictions Precautions Precautions: Sternal;Fall Precaution Comments: watch orthostatics    Mobility  Bed Mobility Overal bed mobility: Needs Assistance Bed Mobility: Rolling;Sidelying to Sit;Sit to Sidelying Rolling: Min assist Sidelying to sit: Min assist     Sit to sidelying: Mod assist General bed mobility comments: Verbal cues for technique. Assist to bring shoulders over and to elevate trunk into sitting. Assist to bring legs back in to bed when returning to sidelying  Transfers Overall transfer level: Needs assistance Equipment used:  (EVA walker) Transfers: Sit to/from Stand Sit to Stand: Min assist;+2 safety/equipment         General transfer comment: Assist to bring hips up and for balance.   Ambulation/Gait Ambulation/Gait assistance: Min assist;+2 safety/equipment Ambulation Distance (Feet): 60 Feet Assistive device:  (EVA walker) Gait Pattern/deviations: Step-through pattern;Decreased stride length;Trunk flexed Gait velocity: decr Gait velocity interpretation: Below normal speed for age/gender General Gait Details: Assist for balance and stability. Amb on 2L of O2 with SpO2>96% and HR  stable.    Stairs            Wheelchair Mobility    Modified Rankin (Stroke Patients Only)       Balance Overall balance assessment: Needs assistance Sitting-balance support: No upper extremity supported;Feet supported Sitting balance-Leahy Scale: Fair     Standing balance support: Bilateral upper extremity supported Standing balance-Leahy Scale: Poor Standing balance comment: Eva walker and min guard assist for static standing                            Cognition Arousal/Alertness: Awake/alert Behavior During Therapy: WFL for tasks assessed/performed Overall Cognitive Status: Within Functional Limits for tasks assessed                                        Exercises      General Comments        Pertinent Vitals/Pain Pain Assessment: No/denies pain    Home Living                      Prior Function            PT Goals (current goals can now be found in the care plan section) Progress towards PT goals: Not progressing toward goals - comment    Frequency    Min 3X/week      PT Plan Current plan remains appropriate    Co-evaluation  AM-PAC PT "6 Clicks" Daily Activity  Outcome Measure  Difficulty turning over in bed (including adjusting bedclothes, sheets and blankets)?: Total Difficulty moving from lying on back to sitting on the side of the bed? : Total Difficulty sitting down on and standing up from a chair with arms (e.g., wheelchair, bedside commode, etc,.)?: Total Help needed moving to and from a bed to chair (including a wheelchair)?: A Little Help needed walking in hospital room?: A Little Help needed climbing 3-5 steps with a railing? : Total 6 Click Score: 10    End of Session Equipment Utilized During Treatment: Gait belt;Oxygen Activity Tolerance: Patient limited by fatigue Patient left: with call bell/phone within reach;with family/visitor present;in bed Nurse  Communication: Mobility status PT Visit Diagnosis: Muscle weakness (generalized) (M62.81);Difficulty in walking, not elsewhere classified (R26.2)     Time: 1901-2224 PT Time Calculation (min) (ACUTE ONLY): 20 min  Charges:  $Gait Training: 8-22 mins                    G Codes:       [facility] PT [place] 02/18/2017, 10:06 AM

## 2017-02-18 NOTE — Progress Notes (Signed)
CRITICAL VALUE ALERT  Critical value received: K+ 3.4  Date of notification:  02/18/17  Time of notification: 2100  Critical value read back:Yes.    Nurse who received alert:  Cleotis Nipper  MD notified (1st page):  Dr. Hollie Salk  Time of first page:2130    Responding MD:  Dr. Hollie Salk Time MD responded:  2130  Dr. Hollie Salk replaced K+ with 10 mEq through central line.

## 2017-02-19 ENCOUNTER — Inpatient Hospital Stay (HOSPITAL_COMMUNITY): Payer: Medicare HMO

## 2017-02-19 DIAGNOSIS — J939 Pneumothorax, unspecified: Secondary | ICD-10-CM

## 2017-02-19 LAB — RENAL FUNCTION PANEL
ANION GAP: 7 (ref 5–15)
Albumin: 2.6 g/dL — ABNORMAL LOW (ref 3.5–5.0)
BUN: 33 mg/dL — ABNORMAL HIGH (ref 6–20)
CALCIUM: 8 mg/dL — AB (ref 8.9–10.3)
CHLORIDE: 98 mmol/L — AB (ref 101–111)
CO2: 24 mmol/L (ref 22–32)
Creatinine, Ser: 2.24 mg/dL — ABNORMAL HIGH (ref 0.61–1.24)
GFR calc non Af Amer: 25 mL/min — ABNORMAL LOW (ref >60)
GFR, EST AFRICAN AMERICAN: 29 mL/min — AB (ref >60)
GLUCOSE: 147 mg/dL — AB (ref 65–99)
Phosphorus: 3.5 mg/dL (ref 2.5–4.6)
Potassium: 3.3 mmol/L — ABNORMAL LOW (ref 3.5–5.1)
SODIUM: 129 mmol/L — AB (ref 135–145)

## 2017-02-19 LAB — GLUCOSE, CAPILLARY
GLUCOSE-CAPILLARY: 129 mg/dL — AB (ref 65–99)
GLUCOSE-CAPILLARY: 103 mg/dL — AB (ref 65–99)
Glucose-Capillary: 82 mg/dL (ref 65–99)

## 2017-02-19 LAB — BPAM RBC
BLOOD PRODUCT EXPIRATION DATE: 201806082359
ISSUE DATE / TIME: 201805110241
Unit Type and Rh: 9500

## 2017-02-19 LAB — TYPE AND SCREEN
ABO/RH(D): O NEG
Antibody Screen: NEGATIVE
Unit division: 0

## 2017-02-19 LAB — CBC
HCT: 27.3 % — ABNORMAL LOW (ref 39.0–52.0)
Hemoglobin: 9.1 g/dL — ABNORMAL LOW (ref 13.0–17.0)
MCH: 25.1 pg — ABNORMAL LOW (ref 26.0–34.0)
MCHC: 33.3 g/dL (ref 30.0–36.0)
MCV: 75.2 fL — ABNORMAL LOW (ref 78.0–100.0)
Platelets: 164 10*3/uL (ref 150–400)
RBC: 3.63 MIL/uL — ABNORMAL LOW (ref 4.22–5.81)
RDW: 17.7 % — ABNORMAL HIGH (ref 11.5–15.5)
WBC: 5.7 10*3/uL (ref 4.0–10.5)

## 2017-02-19 MED ORDER — POTASSIUM CHLORIDE 10 MEQ/50ML IV SOLN
10.0000 meq | INTRAVENOUS | Status: AC
  Administered 2017-02-19 (×3): 10 meq via INTRAVENOUS
  Filled 2017-02-19 (×3): qty 50

## 2017-02-19 MED ORDER — MIDAZOLAM HCL 2 MG/2ML IJ SOLN
INTRAMUSCULAR | Status: AC
  Administered 2017-02-19: 2 mg
  Filled 2017-02-19: qty 2

## 2017-02-19 MED ORDER — LIDOCAINE HCL (PF) 1 % IJ SOLN
INTRAMUSCULAR | Status: AC
  Administered 2017-02-19: 5 mL
  Filled 2017-02-19: qty 5

## 2017-02-19 MED ORDER — MORPHINE SULFATE (PF) 4 MG/ML IV SOLN
INTRAVENOUS | Status: AC
  Administered 2017-02-19: 2 mg
  Filled 2017-02-19: qty 1

## 2017-02-19 MED ORDER — AMIODARONE HCL 200 MG PO TABS
400.0000 mg | ORAL_TABLET | Freq: Two times a day (BID) | ORAL | Status: DC
  Administered 2017-02-19 – 2017-02-20 (×4): 400 mg via ORAL
  Filled 2017-02-19 (×4): qty 2

## 2017-02-19 NOTE — Progress Notes (Signed)
TCTS BRIEF SICU PROGRESS NOTE  4 Days Post-Op  S/P Procedure(s) (LRB): CORONARY ARTERY BYPASS GRAFTING times three  with left internal mammary harvest and endoscopic harvest of Right SVG. Grafts of LIMA to  LAD, SVG to Distal Circ, and to First Diag. (N/A) TRANSESOPHAGEAL ECHOCARDIOGRAM (TEE) (N/A)   Stable day NSR w/ stable BP Breathing comfortably f/u CXR w/ resolution of PTX  Plan: Continue current plan  Rexene Alberts, MD 02/19/2017 4:43 PM

## 2017-02-19 NOTE — Progress Notes (Addendum)
[address]       ,[place] [address]             [phone]        CARDIOTHORACIC SURGERY PROGRESS NOTE   R4 Days Post-Op Procedure(s) (LRB): CORONARY ARTERY BYPASS GRAFTING times three  with left internal mammary harvest and endoscopic harvest of Right SVG. Grafts of LIMA to  LAD, SVG to Distal Circ, and to First Diag. (N/A) TRANSESOPHAGEAL ECHOCARDIOGRAM (TEE) (N/A)  Subjective: Feels better this morning.  Some nausea earlier, mild SOB, no pain  Objective: Vital signs: BP Readings from Last 1 Encounters:  02/19/17 (!) 91/56   Pulse Readings from Last 1 Encounters:  02/19/17 (!) 56   Resp Readings from Last 1 Encounters:  02/19/17 20   Temp Readings from Last 1 Encounters:  02/19/17 98.2 F (36.8 C) (Oral)    Hemodynamics:    Physical Exam:  Rhythm:   Sinus w/ PAC's  Breath sounds: Scattered rhonchi w/ diminished breath sounds left side  Heart sounds:  irregular  Incisions:  Clean and dry  Abdomen:  Soft, non-distended, non-tender  Extremities:  Warm, well-perfused, + edema   Intake/Output from previous day: 05/11 0701 - 05/12 0700 In: 2342 [P.O.:840; I.V.:1154; IV Piggyback:348] Out: 2215 [Urine:2140; Emesis/NG output:75] Intake/Output this shift: Total I/O In: 93 [I.V.:43; IV Piggyback:50] Out: 125 [Urine:125]  Lab Results:  CBC: Recent Labs  02/18/17 2002 02/19/17 0325  WBC 6.5 5.7  HGB 9.4* 9.1*  HCT 29.3* 27.3*  PLT 178 164    BMET:  Recent Labs  02/18/17 2002 02/19/17 0325  NA 127* 129*  K 3.4* 3.3*  CL 96* 98*  CO2 21* 24  GLUCOSE 209* 147*  BUN 32* 33*  CREATININE 2.44* 2.24*  CALCIUM 8.2* 8.0*     PT/INR:  No results for input(s): LABPROT, INR in the last 72 hours.  CBG (last 3)   Recent Labs  02/18/17 2343 02/19/17 0314 02/19/17 0752  GLUCAP 82 129* 103*    ABG    Component Value Date/Time   PHART 7.333 (L) 02/15/2017 1853   PCO2ART 36.5 02/15/2017 1853   PO2ART 91.0 02/15/2017 1853     HCO3 19.4 (L) 02/15/2017 1853   TCO2 24 02/16/2017 1610   ACIDBASEDEF 6.0 (H) 02/15/2017 1853   O2SAT 59.3 02/18/2017 0430    CXR: PORTABLE CHEST 1 VIEW  COMPARISON:  02/18/2017.  FINDINGS: Trachea is midline. Heart is enlarged, stable. Thoracic aorta is calcified. 7 intact sternotomy wires. Right PICC tip is in the low SVC or at the SVC RA junction.  Moderate to large left pneumothorax, stable. Left lower lobe collapse/ consolidation. Minimal right basilar atelectasis.  IMPRESSION: 1. Moderate to large left pneumothorax, stable, with left lower lobe collapse/consolidation. 2. Minimal right basilar atelectasis.   Electronically Signed   By: Lorin Picket M.D.   On: 02/19/2017 08:09   Assessment/Plan: S/P Procedure(s) (LRB): CORONARY ARTERY BYPASS GRAFTING times three  with left internal mammary harvest and endoscopic harvest of Right SVG. Grafts of LIMA to  LAD, SVG to Distal Circ, and to First Diag. (N/A) TRANSESOPHAGEAL ECHOCARDIOGRAM (TEE) (N/A)  Overall stable POD4 Now back in NSR w/ frequent PAC's, stable BP on low dose dopamine Breathing comfortably w/ O2 sats 93-100% on 3 L/min but fairly large left pneumothorax since chest tube removed and very diminished breath sounds on left side Post op Afib, now maintaining NSR on IV amiodarone Non-oliguric ARF, likely due to prerenal azotemia +/-  acute kidney injury caused by ATN with baseline CKD preop - creatinine down slightly this morning and good UOP Hypokalemia, induced by loop diuretics Expected post op acute blood loss anemia, stable Chronic combined systolic and diastolic CHF with expected post-op volume excess, weight 10 lbs > preop   Will place chest tube for large pneumothorax - discussed w/ patient and his wife  Mobilize  Diuresis  Wean dopamine  Watch renal function  Continue amiodarone  Rexene Alberts, MD 02/19/2017 9:55 AM

## 2017-02-19 NOTE — Progress Notes (Signed)
S:Still unable to keep food down.  Able to keep some water down O:BP 118/74   Pulse 87   Temp 97.8 F (36.6 C) (Oral)   Resp (!) 21   Ht '5\' 11"'$  (1.803 m)   Wt 98.7 kg (217 lb 9.5 oz)   SpO2 100%   BMI 30.35 kg/m   Intake/Output Summary (Last 24 hours) at 02/19/17 0743 Last data filed at 02/19/17 0700  Gross per 24 hour  Intake             2342 ml  Output             2115 ml  Net              227 ml   Weight change: 1.5 kg (3 lb 4.9 oz) GHW:EXHBZ and alert CVS:RRR Resp:decreased BS Lt Abd:+ BS NTND Ext:No edema  Rt upper arm PICC NEURO:CNI Ox3 no asterixis   . acetaminophen  1,000 mg Oral Q6H   Or  . acetaminophen (TYLENOL) oral liquid 160 mg/5 mL  1,000 mg Per Tube Q6H  . aspirin  81 mg Per Tube Daily   Or  . aspirin EC  81 mg Oral Daily  . atorvastatin  80 mg Oral q1800  . bisacodyl  10 mg Oral Daily   Or  . bisacodyl  10 mg Rectal Daily  . Chlorhexidine Gluconate Cloth  6 each Topical Daily  . docusate sodium  200 mg Oral Daily  . enoxaparin (LOVENOX) injection  30 mg Subcutaneous QHS  . insulin aspart  0-24 Units Subcutaneous Q4H  . insulin detemir  10 Units Subcutaneous Daily  . metoprolol tartrate  12.5 mg Oral BID   Or  . metoprolol tartrate  12.5 mg Per Tube BID  . pantoprazole  40 mg Oral Daily  . sodium chloride flush  10-40 mL Intracatheter Q12H  . sodium chloride flush  3 mL Intravenous Q12H   US Renal  Result Date: 02/18/2017 CLINICAL DATA:  Chest tube.  Acute kidney injury. EXAM: RENAL / URINARY TRACT ULTRASOUND COMPLETE COMPARISON:  None. FINDINGS: Right Kidney: Length: 12 cm. Echogenicity within normal limits. No solid mass or hydronephrosis visualized. Simple appearing 1 cm cyst at the upper pole. Left Kidney: Length: 11 cm. Echogenicity within normal limits. No solid mass or hydronephrosis visualized. 19 mm interpolar cyst. Bladder: Decompressed by Foley catheter. Bilateral pleural effusion. IMPRESSION: 1. No hydronephrosis. Foley catheter with  decompressed urinary bladder. 2. Bilateral pleural effusion. Electronically Signed   By: Monte Fantasia M.D.   On: 02/18/2017 18:01   Dg Chest Port 1 View  Result Date: 02/18/2017 CLINICAL DATA:  PICC line placement EXAM: PORTABLE CHEST 1 VIEW COMPARISON:  Earlier the same day FINDINGS: 1457 hours. Interval progression left pneumothorax. Retrocardiac collapse/ consolidation noted. Right PICC line tip overlies the distal SVC level. Patient is status post CABG. The visualized bony structures of the thorax are intact. Telemetry leads overlie the chest. IMPRESSION: 1. Right PICC line tip overlies the distal SVC. 2. Interval increase in left pneumothorax. Electronically Signed   By: Misty Stanley M.D.   On: 02/18/2017 15:10   Dg Chest Port 1 View  Result Date: 02/18/2017 CLINICAL DATA:  Status post chest tube removal EXAM: PORTABLE CHEST 1 VIEW COMPARISON:  Feb 17, 2017 FINDINGS: Chest tube is been removed on the left. The left apical pneumothorax appears slightly larger than 1 day prior. No tension component. There is consolidation in the left base with a small left pleural effusion  Right lung is clear. Heart is mildly enlarged with pulmonary vascular within normal limits. Central catheter tip is in the superior vena cava, stable. There is aortic atherosclerosis. No bone lesions. IMPRESSION: Pneumothorax on the left slightly larger after chest tube removal. No tension component. Persistent consolidation left base with small left pleural effusion. This appearance is somewhat 1 day prior. Right lung clear. Stable cardiomegaly. There is aortic atherosclerosis. These results will be called to the ordering clinician or representative by the Radiologist Assistant, and communication documented in the PACS or zVision Dashboard. Electronically Signed   By: Lowella Grip III M.D.   On: 02/18/2017 08:05   BMET    Component Value Date/Time   NA 129 (L) 02/19/2017 0325   K 3.3 (L) 02/19/2017 0325   CL 98 (L)  02/19/2017 0325   CO2 24 02/19/2017 0325   GLUCOSE 147 (H) 02/19/2017 0325   BUN 33 (H) 02/19/2017 0325   CREATININE 2.24 (H) 02/19/2017 0325   CALCIUM 8.0 (L) 02/19/2017 0325   GFRNONAA 25 (L) 02/19/2017 0325   GFRAA 29 (L) 02/19/2017 0325   CBC    Component Value Date/Time   WBC 5.7 02/19/2017 0325   RBC 3.63 (L) 02/19/2017 0325   HGB 9.1 (L) 02/19/2017 0325   HCT 27.3 (L) 02/19/2017 0325   PLT 164 02/19/2017 0325   MCV 75.2 (L) 02/19/2017 0325   MCH 25.1 (L) 02/19/2017 0325   MCHC 33.3 02/19/2017 0325   RDW 17.7 (H) 02/19/2017 0325   LYMPHSABS 1.2 09/01/2016 1358   MONOABS 0.5 09/01/2016 1358   EOSABS 0.1 09/01/2016 1358   BASOSABS 0.0 09/01/2016 1358     Assessment:  1. Nonoliguric ARF secondary to contrast and hypotension post CABG.  UO improved, Scr trending down 2. SP CABG 3. Hx HTN 4. Hypokalemia 5. Lt pneumothorax  Plan: 1. Replace K 2. If UO increases too much then will back off on lasix 3. Daily labs   Anjel Perfetti T

## 2017-02-19 NOTE — Op Note (Signed)
CARDIOTHORACIC SURGERY OPERATIVE NOTE  Date of Procedure:  02/19/2017  Preoperative Diagnosis: Left Pneumothorax  Postoperative Diagnosis: Same  Procedure:   Left chest tube placement  Surgeon:   Valentina Gu. Roxy Manns, MD  Anesthesia: 1% lidocaine local with intravenous sedation    DETAILS OF THE OPERATIVE PROCEDURE  Following full informed consent the patient was given midazolam 2 mg intravenously and continuously monitored for rhythm, BP and oxygen saturation. The left chest was prepared and draped in a sterile manner. 1% lidocaine was utilized to anesthetize the skin and subcutaneous tissues. A small incision was made and a 28 French straight chest tube was placed through the incision into the pleural space. The tube was secured to the skin and connected to a closed suction collection device. The patient tolerated the procedure well. A portable CXR was ordered. There were no complications.    Valentina Gu. Roxy Manns, MD 02/19/2017 10:35 AM

## 2017-02-19 NOTE — Progress Notes (Signed)
CRITICAL VALUE ALERT  Critical value received: K+ 3.3  Date of notification: 02/19/17  Time of notification:  3:25  Critical value read back:Yes.    Nurse who received alert:  0500  MD notified (1st page): Dr. Hollie Salk  No response

## 2017-02-19 NOTE — Progress Notes (Signed)
Progress Note  Patient Name: Vincent Mcclure Date of Encounter: 02/19/2017  Primary Cardiologist:   Dr. Meda Coffee  Subjective   Still somewhat nauseated.  No acute distress  Inpatient Medications    Scheduled Meds: . acetaminophen  1,000 mg Oral Q6H   Or  . acetaminophen (TYLENOL) oral liquid 160 mg/5 mL  1,000 mg Per Tube Q6H  . aspirin  81 mg Per Tube Daily   Or  . aspirin EC  81 mg Oral Daily  . atorvastatin  80 mg Oral q1800  . bisacodyl  10 mg Oral Daily   Or  . bisacodyl  10 mg Rectal Daily  . Chlorhexidine Gluconate Cloth  6 each Topical Daily  . docusate sodium  200 mg Oral Daily  . enoxaparin (LOVENOX) injection  30 mg Subcutaneous QHS  . insulin aspart  0-24 Units Subcutaneous Q4H  . insulin detemir  10 Units Subcutaneous Daily  . metoprolol tartrate  12.5 mg Oral BID   Or  . metoprolol tartrate  12.5 mg Per Tube BID  . pantoprazole  40 mg Oral Daily  . sodium chloride flush  10-40 mL Intracatheter Q12H  . sodium chloride flush  3 mL Intravenous Q12H   Continuous Infusions: . sodium chloride Stopped (02/16/17 0855)  . sodium chloride    . sodium chloride    . amiodarone 30 mg/hr (02/19/17 0456)  . dexmedetomidine (PRECEDEX) IV infusion Stopped (02/15/17 1636)  . DOPamine 4 mcg/kg/min (02/19/17 0135)  . furosemide Stopped (02/19/17 0725)  . lactated ringers    . lactated ringers    . lactated ringers Stopped (02/16/17 1901)  . lactated ringers 20 mL/hr at 02/18/17 1800  . nitroGLYCERIN Stopped (02/16/17 0036)  . phenylephrine (NEO-SYNEPHRINE) Adult infusion Stopped (02/18/17 1700)  . potassium chloride 10 mEq (02/19/17 0821)   PRN Meds: sodium chloride, lactated ringers, metoprolol, midazolam, ondansetron (ZOFRAN) IV, oxyCODONE, sodium chloride flush, sodium chloride flush, traMADol   Vital Signs    Vitals:   02/19/17 0600 02/19/17 0700 02/19/17 0755 02/19/17 0800  BP: 97/60 118/74  (!) 91/56  Pulse: 89 87  (!) 56  Resp: (!) 21 (!) 21  20  Temp:    98.2 F (36.8 C)   TempSrc:   Oral   SpO2: 93% 100%  100%  Weight: 217 lb 9.5 oz (98.7 kg)     Height:        Intake/Output Summary (Last 24 hours) at 02/19/17 0855 Last data filed at 02/19/17 8657  Gross per 24 hour  Intake           2384.5 ml  Output             2310 ml  Net             74.5 ml   Filed Weights   02/17/17 0500 02/18/17 0500 02/19/17 0600  Weight: 215 lb 6.2 oz (97.7 kg) 214 lb 4.6 oz (97.2 kg) 217 lb 9.5 oz (98.7 kg)    Telemetry    NSR with PACs - Personally Reviewed  ECG    NA - Personally Reviewed  Physical Exam   GEN: No acute distress.   Neck: No  JVD Cardiac: RRR, no murmurs, rubs, or gallops.  Respiratory: Clear  to auscultation bilaterally. GI: Soft, nontender, non-distended  MS:  Mild edema; No deformity. Neuro:  Nonfocal  Psych: Normal affect   Labs    Chemistry Recent Labs Lab 02/14/17 1438  02/18/17 0434 02/18/17 2002 02/19/17 8469  NA 132*  < > 130* 127* 129*  K 3.7  < > 3.2* 3.4* 3.3*  CL 102  < > 99* 96* 98*  CO2 22  < > 23 21* 24  GLUCOSE 100*  < > 98 209* 147*  BUN 14  < > 31* 32* 33*  CREATININE 1.18  < > 2.70* 2.44* 2.24*  CALCIUM 8.5*  < > 8.2* 8.2* 8.0*  PROT 5.7*  --  5.5*  --   --   ALBUMIN 3.4*  --  3.1*  --  2.6*  AST 18  --  20  --   --   ALT 11*  --  12*  --   --   ALKPHOS 64  --  63  --   --   BILITOT 0.5  --  0.6  --   --   GFRNONAA 55*  < > 20* 23* 25*  GFRAA >60  < > 24* 27* 29*  ANIONGAP 8  < > '8 10 7  '$ < > = values in this interval not displayed.   Hematology Recent Labs Lab 02/18/17 0434 02/18/17 2002 02/19/17 0325  WBC 7.4 6.5 5.7  RBC 3.64* 3.90* 3.63*  HGB 9.1* 9.4* 9.1*  HCT 27.9* 29.3* 27.3*  MCV 76.6* 75.1* 75.2*  MCH 25.0* 24.1* 25.1*  MCHC 32.6 32.1 33.3  RDW 17.6* 17.2* 17.7*  PLT 138* 178 164    Cardiac Enzymes Recent Labs Lab 02/12/17 1321 02/13/17 0922  TROPONINI 2.24* 1.03*   No results for input(s): TROPIPOC in the last 168 hours.   BNPNo results for input(s):  BNP, PROBNP in the last 168 hours.   DDimer No results for input(s): DDIMER in the last 168 hours.   Radiology    US Renal  Result Date: 02/18/2017 CLINICAL DATA:  Chest tube.  Acute kidney injury. EXAM: RENAL / URINARY TRACT ULTRASOUND COMPLETE COMPARISON:  None. FINDINGS: Right Kidney: Length: 12 cm. Echogenicity within normal limits. No solid mass or hydronephrosis visualized. Simple appearing 1 cm cyst at the upper pole. Left Kidney: Length: 11 cm. Echogenicity within normal limits. No solid mass or hydronephrosis visualized. 19 mm interpolar cyst. Bladder: Decompressed by Foley catheter. Bilateral pleural effusion. IMPRESSION: 1. No hydronephrosis. Foley catheter with decompressed urinary bladder. 2. Bilateral pleural effusion. Electronically Signed   By: Monte Fantasia M.D.   On: 02/18/2017 18:01   Dg Chest Port 1 View  Result Date: 02/19/2017 CLINICAL DATA:  CABG. EXAM: PORTABLE CHEST 1 VIEW COMPARISON:  02/18/2017. FINDINGS: Trachea is midline. Heart is enlarged, stable. Thoracic aorta is calcified. 7 intact sternotomy wires. Right PICC tip is in the low SVC or at the SVC RA junction. Moderate to large left pneumothorax, stable. Left lower lobe collapse/ consolidation. Minimal right basilar atelectasis. IMPRESSION: 1. Moderate to large left pneumothorax, stable, with left lower lobe collapse/consolidation. 2. Minimal right basilar atelectasis. Electronically Signed   By: Lorin Picket M.D.   On: 02/19/2017 08:09   Dg Chest Port 1 View  Result Date: 02/18/2017 CLINICAL DATA:  PICC line placement EXAM: PORTABLE CHEST 1 VIEW COMPARISON:  Earlier the same day FINDINGS: 1457 hours. Interval progression left pneumothorax. Retrocardiac collapse/ consolidation noted. Right PICC line tip overlies the distal SVC level. Patient is status post CABG. The visualized bony structures of the thorax are intact. Telemetry leads overlie the chest. IMPRESSION: 1. Right PICC line tip overlies the distal SVC. 2.  Interval increase in left pneumothorax. Electronically Signed   By: Verda Cumins.D.  On: 02/18/2017 15:10   Dg Chest Port 1 View  Result Date: 02/18/2017 CLINICAL DATA:  Status post chest tube removal EXAM: PORTABLE CHEST 1 VIEW COMPARISON:  Feb 17, 2017 FINDINGS: Chest tube is been removed on the left. The left apical pneumothorax appears slightly larger than 1 day prior. No tension component. There is consolidation in the left base with a small left pleural effusion Right lung is clear. Heart is mildly enlarged with pulmonary vascular within normal limits. Central catheter tip is in the superior vena cava, stable. There is aortic atherosclerosis. No bone lesions. IMPRESSION: Pneumothorax on the left slightly larger after chest tube removal. No tension component. Persistent consolidation left base with small left pleural effusion. This appearance is somewhat 1 day prior. Right lung clear. Stable cardiomegaly. There is aortic atherosclerosis. These results will be called to the ordering clinician or representative by the Radiologist Assistant, and communication documented in the PACS or zVision Dashboard. Electronically Signed   By: Lowella Grip III M.D.   On: 02/18/2017 08:05    Cardiac Studies   NA  Patient Profile     83 y.o. male with PMH of HTN, HLD, GERD, ETOH use, and prior tobacco use who presented to [facility] ED on 02/12/2017 for evaluation of chest pain. Found to have an NSTEMI with peak troponin of 2.24. Cath showed significant LM and LAD disease. S/p CABG on 02/15/2017. Cardiology asked to see again on 02/18/2017 for post-operative atrial fibrillation.  Assessment & Plan    NSTEMI/CAD:  Status post CABG.    ATRIAL FIB:  Converted to NSR at 06:53.  Discontinue IV amio and begin PO.    GIB/POST OP ANEMIA:  HYPOTENSION:  HLD:  Continue lipitor 80 mg daily.    Signed, Minus Breeding, MD  02/19/2017, 8:55 AM  ]

## 2017-02-20 ENCOUNTER — Inpatient Hospital Stay (HOSPITAL_COMMUNITY): Payer: Medicare HMO

## 2017-02-20 LAB — COMPREHENSIVE METABOLIC PANEL
ALT: 13 U/L — AB (ref 17–63)
ANION GAP: 9 (ref 5–15)
AST: 21 U/L (ref 15–41)
Albumin: 2.7 g/dL — ABNORMAL LOW (ref 3.5–5.0)
Alkaline Phosphatase: 93 U/L (ref 38–126)
BUN: 37 mg/dL — AB (ref 6–20)
CALCIUM: 8.2 mg/dL — AB (ref 8.9–10.3)
CO2: 25 mmol/L (ref 22–32)
Chloride: 96 mmol/L — ABNORMAL LOW (ref 101–111)
Creatinine, Ser: 2.08 mg/dL — ABNORMAL HIGH (ref 0.61–1.24)
GFR calc Af Amer: 32 mL/min — ABNORMAL LOW (ref >60)
GFR calc non Af Amer: 28 mL/min — ABNORMAL LOW (ref >60)
GLUCOSE: 92 mg/dL (ref 65–99)
Potassium: 3.3 mmol/L — ABNORMAL LOW (ref 3.5–5.1)
SODIUM: 130 mmol/L — AB (ref 135–145)
TOTAL PROTEIN: 5.3 g/dL — AB (ref 6.5–8.1)
Total Bilirubin: 0.7 mg/dL (ref 0.3–1.2)

## 2017-02-20 LAB — CBC
HCT: 28.6 % — ABNORMAL LOW (ref 39.0–52.0)
Hemoglobin: 9.5 g/dL — ABNORMAL LOW (ref 13.0–17.0)
MCH: 25.1 pg — AB (ref 26.0–34.0)
MCHC: 33.2 g/dL (ref 30.0–36.0)
MCV: 75.7 fL — ABNORMAL LOW (ref 78.0–100.0)
PLATELETS: 197 10*3/uL (ref 150–400)
RBC: 3.78 MIL/uL — AB (ref 4.22–5.81)
RDW: 17.9 % — AB (ref 11.5–15.5)
WBC: 5.3 10*3/uL (ref 4.0–10.5)

## 2017-02-20 LAB — PHOSPHORUS: Phosphorus: 3.8 mg/dL (ref 2.5–4.6)

## 2017-02-20 LAB — GLUCOSE, CAPILLARY
Glucose-Capillary: 129 mg/dL — ABNORMAL HIGH (ref 65–99)
Glucose-Capillary: 101 mg/dL — ABNORMAL HIGH (ref 65–99)
Glucose-Capillary: 116 mg/dL — ABNORMAL HIGH (ref 65–99)

## 2017-02-20 MED ORDER — SODIUM CHLORIDE 0.9% FLUSH: 3.0000 mL | INTRAVENOUS | Status: DC | PRN

## 2017-02-20 MED ORDER — POTASSIUM CHLORIDE 10 MEQ/50ML IV SOLN
10.0000 meq | INTRAVENOUS | Status: AC | PRN
  Administered 2017-02-20 (×3): 10 meq via INTRAVENOUS
  Filled 2017-02-20 (×3): qty 50

## 2017-02-20 MED ORDER — SODIUM CHLORIDE 0.9% FLUSH
3.0000 mL | Freq: Two times a day (BID) | INTRAVENOUS | Status: DC
  Administered 2017-02-20 – 2017-02-23 (×4): 3 mL via INTRAVENOUS

## 2017-02-20 MED ORDER — POTASSIUM CHLORIDE 10 MEQ/50ML IV SOLN
10.0000 meq | INTRAVENOUS | Status: AC
  Administered 2017-02-20 (×3): 10 meq via INTRAVENOUS
  Filled 2017-02-20 (×3): qty 50

## 2017-02-20 MED ORDER — SODIUM CHLORIDE 0.9 % IV SOLN: 250.0000 mL | INTRAVENOUS | Status: DC | PRN

## 2017-02-20 MED ORDER — INSULIN ASPART 100 UNIT/ML ~~LOC~~ SOLN
0.0000 [IU] | Freq: Three times a day (TID) | SUBCUTANEOUS | Status: DC
  Administered 2017-02-20 – 2017-02-24 (×8): 2 [IU] via SUBCUTANEOUS

## 2017-02-20 MED ORDER — FUROSEMIDE 10 MG/ML IJ SOLN
80.0000 mg | Freq: Two times a day (BID) | INTRAMUSCULAR | Status: DC
  Administered 2017-02-20 – 2017-02-21 (×2): 80 mg via INTRAVENOUS
  Filled 2017-02-20 (×2): qty 8

## 2017-02-20 MED ORDER — MOVING RIGHT ALONG BOOK
Freq: Once | Status: AC
  Administered 2017-02-20: 1
  Filled 2017-02-20: qty 1

## 2017-02-20 NOTE — Progress Notes (Signed)
[facility] BRIEF SICU PROGRESS NOTE  5 Days Post-Op  S/P Procedure(s) (LRB): CORONARY ARTERY BYPASS GRAFTING times three  with left internal mammary harvest and endoscopic harvest of Right SVG. Grafts of LIMA to  LAD, SVG to Distal Circ, and to First Diag. (N/A) TRANSESOPHAGEAL ECHOCARDIOGRAM (TEE) (N/A)   Stable day  Plan: Continue current plan  Rexene Alberts, MD 02/20/2017 5:32 PM

## 2017-02-20 NOTE — Progress Notes (Signed)
Progress Note  Patient Name: Vincent Mcclure Date of Encounter: 02/20/2017  Primary Cardiologist:   Dr. Meda Coffee  Subjective   Soreness from chest tube. Otherwise, no SOB  Inpatient Medications    Scheduled Meds: . acetaminophen  1,000 mg Oral Q6H  . amiodarone  400 mg Oral BID  . aspirin EC  81 mg Oral Daily  . atorvastatin  80 mg Oral q1800  . bisacodyl  10 mg Oral Daily   Or  . bisacodyl  10 mg Rectal Daily  . Chlorhexidine Gluconate Cloth  6 each Topical Daily  . docusate sodium  200 mg Oral Daily  . enoxaparin (LOVENOX) injection  30 mg Subcutaneous QHS  . furosemide  80 mg Intravenous Q12H  . insulin detemir  10 Units Subcutaneous Daily  . metoprolol tartrate  12.5 mg Oral BID  . pantoprazole  40 mg Oral Daily  . sodium chloride flush  10-40 mL Intracatheter Q12H  . sodium chloride flush  3 mL Intravenous Q12H   Continuous Infusions: . sodium chloride    . DOPamine Stopped (02/19/17 1700)  . lactated ringers    . lactated ringers Stopped (02/16/17 1901)  . lactated ringers 20 mL/hr at 02/19/17 2357  . potassium chloride Stopped (02/20/17 0631)   PRN Meds: metoprolol, ondansetron (ZOFRAN) IV, oxyCODONE, potassium chloride, sodium chloride flush, sodium chloride flush, traMADol   Vital Signs    Vitals:   02/20/17 0440 02/20/17 0500 02/20/17 0600 02/20/17 0747  BP:  (!) 104/57 (!) 124/51   Pulse:  70 75   Resp:  18 18   Temp:    97.7 F (36.5 C)  TempSrc:    Axillary  SpO2:  100% 100%   Weight: 217 lb 13 oz (98.8 kg)     Height:        Intake/Output Summary (Last 24 hours) at 02/20/17 0917 Last data filed at 02/20/17 0700  Gross per 24 hour  Intake           1159.6 ml  Output             2614 ml  Net          -1454.4 ml   Filed Weights   02/18/17 0500 02/19/17 0600 02/20/17 0440  Weight: 214 lb 4.6 oz (97.2 kg) 217 lb 9.5 oz (98.7 kg) 217 lb 13 oz (98.8 kg)    Telemetry    NSR  - Personally Reviewed  ECG    NA - Personally  Reviewed  Physical Exam   GEN: No  acute distress.   Neck: No  JVD Cardiac:  RRR, no murmurs, rubs, or gallops.  Respiratory: Clea  to auscultation bilaterally with slightly decreased breath sounds. GI: Soft, nontender, non-distended, normal bowel sounds  MS:  No edema; No deformity. Neuro:   Nonfocal  Psych: Oriented and appropriate    Labs    Chemistry Recent Labs Lab 02/14/17 1438  02/18/17 0434 02/18/17 2002 02/19/17 0325 02/20/17 0339  NA 132*  < > 130* 127* 129* 130*  K 3.7  < > 3.2* 3.4* 3.3* 3.3*  CL 102  < > 99* 96* 98* 96*  CO2 22  < > 23 21* 24 25  GLUCOSE 100*  < > 98 209* 147* 92  BUN 14  < > 31* 32* 33* 37*  CREATININE 1.18  < > 2.70* 2.44* 2.24* 2.08*  CALCIUM 8.5*  < > 8.2* 8.2* 8.0* 8.2*  PROT 5.7*  --  5.5*  --   --  5.3*  ALBUMIN 3.4*  --  3.1*  --  2.6* 2.7*  AST 18  --  20  --   --  21  ALT 11*  --  12*  --   --  13*  ALKPHOS 64  --  63  --   --  93  BILITOT 0.5  --  0.6  --   --  0.7  GFRNONAA 55*  < > 20* 23* 25* 28*  GFRAA >60  < > 24* 27* 29* 32*  ANIONGAP 8  < > '8 10 7 9  '$ < > = values in this interval not displayed.   Hematology  Recent Labs Lab 02/18/17 2002 02/19/17 0325 02/20/17 0339  WBC 6.5 5.7 5.3  RBC 3.90* 3.63* 3.78*  HGB 9.4* 9.1* 9.5*  HCT 29.3* 27.3* 28.6*  MCV 75.1* 75.2* 75.7*  MCH 24.1* 25.1* 25.1*  MCHC 32.1 33.3 33.2  RDW 17.2* 17.7* 17.9*  PLT 178 164 197    Cardiac Enzymes  Recent Labs Lab 02/13/17 0922  TROPONINI 1.03*   No results for input(s): TROPIPOC in the last 168 hours.   BNPNo results for input(s): BNP, PROBNP in the last 168 hours.   DDimer No results for input(s): DDIMER in the last 168 hours.   Radiology    US Renal  Result Date: 02/18/2017 CLINICAL DATA:  Chest tube.  Acute kidney injury. EXAM: RENAL / URINARY TRACT ULTRASOUND COMPLETE COMPARISON:  None. FINDINGS: Right Kidney: Length: 12 cm. Echogenicity within normal limits. No solid mass or hydronephrosis visualized. Simple  appearing 1 cm cyst at the upper pole. Left Kidney: Length: 11 cm. Echogenicity within normal limits. No solid mass or hydronephrosis visualized. 19 mm interpolar cyst. Bladder: Decompressed by Foley catheter. Bilateral pleural effusion. IMPRESSION: 1. No hydronephrosis. Foley catheter with decompressed urinary bladder. 2. Bilateral pleural effusion. Electronically Signed   By: Monte Fantasia M.D.   On: 02/18/2017 18:01   Dg Chest 1v Repeat Same Day  Result Date: 02/19/2017 CLINICAL DATA:  83 year old male status post left-sided chest tube placement for pneumothorax EXAM: CHEST - 1 VIEW SAME DAY COMPARISON:  Prior chest x-ray obtained earlier today at 5:57 a.m. FINDINGS: Interval placement of a left-sided thoracostomy tube with successful re-expansion of the left lung. There is a small residual left apical pneumothorax. Persistent opacification at the left lung base obscuring the diaphragm likely reflects atelectasis. Stable cardiomegaly. Patient is status post median sternotomy with evidence of multivessel CABG. Atherosclerotic calcifications are present in the transverse aorta. Right upper extremity PICC with the catheter tip overlying the superior cavoatrial junction. Low inspiratory volumes. No pulmonary edema. No acute osseous abnormality. IMPRESSION: 1. Near-total resolution of a left-sided pneumothorax following chest tube placement. A trace apical pneumothorax remains. 2. Persistent left lower lobe opacity likely reflecting left lower lobe atelectasis. 3. Stable position of right upper extremity PICC. 4. Stable cardiomegaly. Electronically Signed   By: Jacqulynn Cadet M.D.   On: 02/19/2017 10:57   Dg Chest Port 1 View  Result Date: 02/20/2017 CLINICAL DATA:  CABG. EXAM: PORTABLE CHEST 1 VIEW COMPARISON:  Feb 19, 2017 FINDINGS: The right PICC line is stable. A left chest tube remains in place. Subcutaneous air in the left chest wall is stable. The small left apical pneumothorax is smaller in the  interval. No right-sided pneumothorax. Improving opacity in the left base. No other interval changes. IMPRESSION: 1. Improving tiny left apical pneumothorax with a left-sided chest tube in place. 2. Improving left basilar opacity. 3.  No other change or acute abnormality. Electronically Signed   By: Dorise Bullion III M.D   On: 02/20/2017 07:13   Dg Chest Port 1 View  Result Date: 02/19/2017 CLINICAL DATA:  CABG. EXAM: PORTABLE CHEST 1 VIEW COMPARISON:  02/18/2017. FINDINGS: Trachea is midline. Heart is enlarged, stable. Thoracic aorta is calcified. 7 intact sternotomy wires. Right PICC tip is in the low SVC or at the SVC RA junction. Moderate to large left pneumothorax, stable. Left lower lobe collapse/ consolidation. Minimal right basilar atelectasis. IMPRESSION: 1. Moderate to large left pneumothorax, stable, with left lower lobe collapse/consolidation. 2. Minimal right basilar atelectasis. Electronically Signed   By: Lorin Picket M.D.   On: 02/19/2017 08:09   Dg Chest Port 1 View  Result Date: 02/18/2017 CLINICAL DATA:  PICC line placement EXAM: PORTABLE CHEST 1 VIEW COMPARISON:  Earlier the same day FINDINGS: 1457 hours. Interval progression left pneumothorax. Retrocardiac collapse/ consolidation noted. Right PICC line tip overlies the distal SVC level. Patient is status post CABG. The visualized bony structures of the thorax are intact. Telemetry leads overlie the chest. IMPRESSION: 1. Right PICC line tip overlies the distal SVC. 2. Interval increase in left pneumothorax. Electronically Signed   By: Misty Stanley M.D.   On: 02/18/2017 15:10    Cardiac Studies   NA  Patient Profile     83 y.o. male with PMH of HTN, HLD, GERD, ETOH use, and prior tobacco use who presented to [facility] ED on 02/12/2017 for evaluation of chest pain. Found to have an NSTEMI with peak troponin of 2.24. Cath showed significant LM and LAD disease. S/p CABG on 02/15/2017. Cardiology asked to see again on 02/18/2017 for  post-operative atrial fibrillation.  Assessment & Plan    NSTEMI/CAD:  Status post CABG.  Now with chest tubes for pneumothorax.  ATRIAL FIB:  Maintaining NSR.  On PO amiodarone.   HYPOTENSION:  BP still labile.  Metoprolol has been held for many doses.  I will discontinue but should be restarted prior to discharge in BP allows.  He is tolerating IV Lasix.  Consider change in PO in AM.   HLD:   On Lipitor.    CKD:  Creat is stable/improved.   Signed, Minus Breeding, MD  02/20/2017, 9:17 AM  ]

## 2017-02-20 NOTE — Progress Notes (Signed)
S: Less nausea.  Keeping fluids down O:BP (!) 124/51 (BP Location: Left Arm)   Pulse 75   Temp 97.7 F (36.5 C) (Axillary)   Resp 18   Ht '5\' 11"'$  (1.803 m)   Wt 98.8 kg (217 lb 13 oz)   SpO2 100%   BMI 30.38 kg/m   Intake/Output Summary (Last 24 hours) at 02/20/17 0837 Last data filed at 02/20/17 0700  Gross per 24 hour  Intake           1442.6 ml  Output             2689 ml  Net          -1246.4 ml   Weight change: 0.1 kg (3.5 oz) VVO:HYWVP and alert CVS:RRR Resp: Better BS on LT Abd:+ BS NTND Ext:No edema  Rt upper arm PICC NEURO:CNI Ox3 no asterixis Lt Chest tube   . acetaminophen  1,000 mg Oral Q6H  . amiodarone  400 mg Oral BID  . aspirin EC  81 mg Oral Daily  . atorvastatin  80 mg Oral q1800  . bisacodyl  10 mg Oral Daily   Or  . bisacodyl  10 mg Rectal Daily  . Chlorhexidine Gluconate Cloth  6 each Topical Daily  . docusate sodium  200 mg Oral Daily  . enoxaparin (LOVENOX) injection  30 mg Subcutaneous QHS  . insulin detemir  10 Units Subcutaneous Daily  . metoprolol tartrate  12.5 mg Oral BID  . pantoprazole  40 mg Oral Daily  . sodium chloride flush  10-40 mL Intracatheter Q12H  . sodium chloride flush  3 mL Intravenous Q12H   US Renal  Result Date: 02/18/2017 CLINICAL DATA:  Chest tube.  Acute kidney injury. EXAM: RENAL / URINARY TRACT ULTRASOUND COMPLETE COMPARISON:  None. FINDINGS: Right Kidney: Length: 12 cm. Echogenicity within normal limits. No solid mass or hydronephrosis visualized. Simple appearing 1 cm cyst at the upper pole. Left Kidney: Length: 11 cm. Echogenicity within normal limits. No solid mass or hydronephrosis visualized. 19 mm interpolar cyst. Bladder: Decompressed by Foley catheter. Bilateral pleural effusion. IMPRESSION: 1. No hydronephrosis. Foley catheter with decompressed urinary bladder. 2. Bilateral pleural effusion. Electronically Signed   By: Monte Fantasia M.D.   On: 02/18/2017 18:01   Dg Chest 1v Repeat Same Day  Result Date:  02/19/2017 CLINICAL DATA:  83 year old male status post left-sided chest tube placement for pneumothorax EXAM: CHEST - 1 VIEW SAME DAY COMPARISON:  Prior chest x-ray obtained earlier today at 5:57 a.m. FINDINGS: Interval placement of a left-sided thoracostomy tube with successful re-expansion of the left lung. There is a small residual left apical pneumothorax. Persistent opacification at the left lung base obscuring the diaphragm likely reflects atelectasis. Stable cardiomegaly. Patient is status post median sternotomy with evidence of multivessel CABG. Atherosclerotic calcifications are present in the transverse aorta. Right upper extremity PICC with the catheter tip overlying the superior cavoatrial junction. Low inspiratory volumes. No pulmonary edema. No acute osseous abnormality. IMPRESSION: 1. Near-total resolution of a left-sided pneumothorax following chest tube placement. A trace apical pneumothorax remains. 2. Persistent left lower lobe opacity likely reflecting left lower lobe atelectasis. 3. Stable position of right upper extremity PICC. 4. Stable cardiomegaly. Electronically Signed   By: Jacqulynn Cadet M.D.   On: 02/19/2017 10:57   Dg Chest Port 1 View  Result Date: 02/20/2017 CLINICAL DATA:  CABG. EXAM: PORTABLE CHEST 1 VIEW COMPARISON:  Feb 19, 2017 FINDINGS: The right PICC line is stable. A left  chest tube remains in place. Subcutaneous air in the left chest wall is stable. The small left apical pneumothorax is smaller in the interval. No right-sided pneumothorax. Improving opacity in the left base. No other interval changes. IMPRESSION: 1. Improving tiny left apical pneumothorax with a left-sided chest tube in place. 2. Improving left basilar opacity. 3. No other change or acute abnormality. Electronically Signed   By: Dorise Bullion III M.D   On: 02/20/2017 07:13   Dg Chest Port 1 View  Result Date: 02/19/2017 CLINICAL DATA:  CABG. EXAM: PORTABLE CHEST 1 VIEW COMPARISON:  02/18/2017.  FINDINGS: Trachea is midline. Heart is enlarged, stable. Thoracic aorta is calcified. 7 intact sternotomy wires. Right PICC tip is in the low SVC or at the SVC RA junction. Moderate to large left pneumothorax, stable. Left lower lobe collapse/ consolidation. Minimal right basilar atelectasis. IMPRESSION: 1. Moderate to large left pneumothorax, stable, with left lower lobe collapse/consolidation. 2. Minimal right basilar atelectasis. Electronically Signed   By: Lorin Picket M.D.   On: 02/19/2017 08:09   Dg Chest Port 1 View  Result Date: 02/18/2017 CLINICAL DATA:  PICC line placement EXAM: PORTABLE CHEST 1 VIEW COMPARISON:  Earlier the same day FINDINGS: 1457 hours. Interval progression left pneumothorax. Retrocardiac collapse/ consolidation noted. Right PICC line tip overlies the distal SVC level. Patient is status post CABG. The visualized bony structures of the thorax are intact. Telemetry leads overlie the chest. IMPRESSION: 1. Right PICC line tip overlies the distal SVC. 2. Interval increase in left pneumothorax. Electronically Signed   By: Misty Stanley M.D.   On: 02/18/2017 15:10   BMET    Component Value Date/Time   NA 130 (L) 02/20/2017 0339   K 3.3 (L) 02/20/2017 0339   CL 96 (L) 02/20/2017 0339   CO2 25 02/20/2017 0339   GLUCOSE 92 02/20/2017 0339   BUN 37 (H) 02/20/2017 0339   CREATININE 2.08 (H) 02/20/2017 0339   CALCIUM 8.2 (L) 02/20/2017 0339   GFRNONAA 28 (L) 02/20/2017 0339   GFRAA 32 (L) 02/20/2017 0339   CBC    Component Value Date/Time   WBC 5.3 02/20/2017 0339   RBC 3.78 (L) 02/20/2017 0339   HGB 9.5 (L) 02/20/2017 0339   HCT 28.6 (L) 02/20/2017 0339   PLT 197 02/20/2017 0339   MCV 75.7 (L) 02/20/2017 0339   MCH 25.1 (L) 02/20/2017 0339   MCHC 33.2 02/20/2017 0339   RDW 17.9 (H) 02/20/2017 0339   LYMPHSABS 1.2 09/01/2016 1358   MONOABS 0.5 09/01/2016 1358   EOSABS 0.1 09/01/2016 1358   BASOSABS 0.0 09/01/2016 1358     Assessment:  1. Nonoliguric ARF  secondary to contrast and hypotension post CABG.  BP variable but UO excellent and Scr still trending down 2. SP CABG 3. Hx HTN 4. Hypokalemia  receiving IV K 5. Lt pneumothorax  SP chest tube placement yest  Plan: 1. Receiving IV  2. Anticipate renal fx to cont to improve 3. Recheck renal fx in AM 4. Decrease lasix dose to '80mg'$  BID   Kelleen Stolze T

## 2017-02-20 NOTE — Progress Notes (Signed)
[address]       [place] [address]             [phone]        CARDIOTHORACIC SURGERY PROGRESS NOTE   R5 Days Post-Op Procedure(s) (LRB): CORONARY ARTERY BYPASS GRAFTING times three  with left internal mammary harvest and endoscopic harvest of Right SVG. Grafts of LIMA to  LAD, SVG to Distal Circ, and to First Diag. (N/A) TRANSESOPHAGEAL ECHOCARDIOGRAM (TEE) (N/A)  Subjective: Feels okay.  Breathing much improved since chest tube placed.  Some soreness from tube.  Still weak and moving slowly.  Occasional mild nausea and dizziness  Objective: Vital signs: BP Readings from Last 1 Encounters:  02/20/17 (!) 124/51   Pulse Readings from Last 1 Encounters:  02/20/17 75   Resp Readings from Last 1 Encounters:  02/20/17 18   Temp Readings from Last 1 Encounters:  02/20/17 97.7 F (36.5 C) (Axillary)    Hemodynamics:    Physical Exam:  Rhythm:   sinus  Breath sounds: clear  Heart sounds:  RRR  Incisions:  Clean and dry  Abdomen:  Soft, non-distended, non-tender  Extremities:  Warm, well-perfused  Chest tubes:  low volume thin serosanguinous output, no air leak    Intake/Output from previous day: 05/12 0701 - 05/13 0700 In: 1535.6 [P.O.:700; I.V.:603.6; IV Piggyback:232] Out: 2814 [Urine:2289; Chest Tube:525] Intake/Output this shift: No intake/output data recorded.  Lab Results:  CBC: Recent Labs  02/19/17 0325 02/20/17 0339  WBC 5.7 5.3  HGB 9.1* 9.5*  HCT 27.3* 28.6*  PLT 164 197    BMET:  Recent Labs  02/19/17 0325 02/20/17 0339  NA 129* 130*  K 3.3* 3.3*  CL 98* 96*  CO2 24 25  GLUCOSE 147* 92  BUN 33* 37*  CREATININE 2.24* 2.08*  CALCIUM 8.0* 8.2*     PT/INR:  No results for input(s): LABPROT, INR in the last 72 hours.  CBG (last 3)   Recent Labs  02/18/17 2343 02/19/17 0314 02/19/17 0752  GLUCAP 82 129* 103*    ABG    Component Value Date/Time   PHART 7.333 (L) 02/15/2017 1853   PCO2ART 36.5  02/15/2017 1853   PO2ART 91.0 02/15/2017 1853   HCO3 19.4 (L) 02/15/2017 1853   TCO2 24 02/16/2017 1610   ACIDBASEDEF 6.0 (H) 02/15/2017 1853   O2SAT 59.3 02/18/2017 0430    CXR: PORTABLE CHEST 1 VIEW  COMPARISON:  Feb 19, 2017  FINDINGS: The right PICC line is stable. A left chest tube remains in place. Subcutaneous air in the left chest wall is stable. The small left apical pneumothorax is smaller in the interval. No right-sided pneumothorax. Improving opacity in the left base. No other interval changes.  IMPRESSION: 1. Improving tiny left apical pneumothorax with a left-sided chest tube in place. 2. Improving left basilar opacity. 3. No other change or acute abnormality.   Electronically Signed   By: Dorise Bullion III M.D   On: 02/20/2017 07:13  Assessment/Plan: S/P Procedure(s) (LRB): CORONARY ARTERY BYPASS GRAFTING times three  with left internal mammary harvest and endoscopic harvest of Right SVG. Grafts of LIMA to  LAD, SVG to Distal Circ, and to First Diag. (N/A) TRANSESOPHAGEAL ECHOCARDIOGRAM (TEE) (N/A)  Overall stable POD5 Maintaining NSR w/ frequent PAC's, stable BP  Breathing comfortably w/ O2 sats 100% on 2 L/min Left pneumothorax, resolved after chest tube placement Non-oliguric ARF, likely due to prerenal azotemia +/- acute kidney injury caused by  ATN with baseline CKD preop - creatinine down further this morning and good UOP Hypokalemia, induced by loop diuretics Expected post op acute blood loss anemia, stable Chronic combined systolic and diastolic CHF with expected post-op volume excess, weight still 10 lbs > preop   Leave chest tube in place for now  Mobilize  Diuresis  Watch renal function  Continue amiodarone  D/C foley  Rexene Alberts, MD 02/20/2017 10:21 AM

## 2017-02-21 ENCOUNTER — Inpatient Hospital Stay (HOSPITAL_COMMUNITY): Payer: Medicare HMO

## 2017-02-21 LAB — CBC
HCT: 26.1 % — ABNORMAL LOW (ref 39.0–52.0)
HEMOGLOBIN: 8.6 g/dL — AB (ref 13.0–17.0)
MCH: 25 pg — AB (ref 26.0–34.0)
MCHC: 33 g/dL (ref 30.0–36.0)
MCV: 75.9 fL — ABNORMAL LOW (ref 78.0–100.0)
PLATELETS: 194 10*3/uL (ref 150–400)
RBC: 3.44 MIL/uL — AB (ref 4.22–5.81)
RDW: 17.8 % — ABNORMAL HIGH (ref 11.5–15.5)
WBC: 4.5 10*3/uL (ref 4.0–10.5)

## 2017-02-21 LAB — RENAL FUNCTION PANEL
ALBUMIN: 2.6 g/dL — AB (ref 3.5–5.0)
ANION GAP: 10 (ref 5–15)
BUN: 36 mg/dL — ABNORMAL HIGH (ref 6–20)
CALCIUM: 7.9 mg/dL — AB (ref 8.9–10.3)
CO2: 26 mmol/L (ref 22–32)
CREATININE: 1.67 mg/dL — AB (ref 0.61–1.24)
Chloride: 95 mmol/L — ABNORMAL LOW (ref 101–111)
GFR, EST AFRICAN AMERICAN: 42 mL/min — AB (ref >60)
GFR, EST NON AFRICAN AMERICAN: 36 mL/min — AB (ref >60)
Glucose, Bld: 92 mg/dL (ref 65–99)
PHOSPHORUS: 3 mg/dL (ref 2.5–4.6)
Potassium: 3.2 mmol/L — ABNORMAL LOW (ref 3.5–5.1)
SODIUM: 131 mmol/L — AB (ref 135–145)

## 2017-02-21 LAB — GLUCOSE, CAPILLARY
GLUCOSE-CAPILLARY: 96 mg/dL (ref 65–99)
Glucose-Capillary: 103 mg/dL — ABNORMAL HIGH (ref 65–99)
Glucose-Capillary: 107 mg/dL — ABNORMAL HIGH (ref 65–99)
Glucose-Capillary: 124 mg/dL — ABNORMAL HIGH (ref 65–99)

## 2017-02-21 LAB — COOXEMETRY PANEL
Carboxyhemoglobin: 1.2 % (ref 0.5–1.5)
Methemoglobin: 1.1 % (ref 0.0–1.5)
O2 Saturation: 55.5 %
Total hemoglobin: 9.1 g/dL — ABNORMAL LOW (ref 12.0–16.0)

## 2017-02-21 MED ORDER — POTASSIUM CHLORIDE 10 MEQ/50ML IV SOLN
10.0000 meq | INTRAVENOUS | Status: AC
  Administered 2017-02-21 (×3): 10 meq via INTRAVENOUS
  Filled 2017-02-21 (×6): qty 50

## 2017-02-21 MED ORDER — METOPROLOL TARTRATE 25 MG PO TABS: 25.0000 mg | ORAL_TABLET | Freq: Two times a day (BID) | ORAL | Status: DC

## 2017-02-21 MED ORDER — PANTOPRAZOLE SODIUM 40 MG PO TBEC
40.0000 mg | DELAYED_RELEASE_TABLET | Freq: Two times a day (BID) | ORAL | Status: DC
  Administered 2017-02-21 – 2017-02-25 (×9): 40 mg via ORAL
  Filled 2017-02-21 (×9): qty 1

## 2017-02-21 MED ORDER — POTASSIUM CHLORIDE 10 MEQ/50ML IV SOLN
10.0000 meq | INTRAVENOUS | Status: AC
  Administered 2017-02-21 (×2): 10 meq via INTRAVENOUS
  Filled 2017-02-21 (×3): qty 50

## 2017-02-21 MED ORDER — METOPROLOL TARTRATE 12.5 MG HALF TABLET
12.5000 mg | ORAL_TABLET | Freq: Two times a day (BID) | ORAL | Status: DC
  Administered 2017-02-21 (×2): 12.5 mg via ORAL
  Filled 2017-02-21 (×2): qty 1

## 2017-02-21 MED ORDER — PRAVASTATIN SODIUM 40 MG PO TABS
40.0000 mg | ORAL_TABLET | Freq: Every day | ORAL | Status: DC
  Administered 2017-02-23 – 2017-02-24 (×2): 40 mg via ORAL
  Filled 2017-02-21 (×2): qty 1

## 2017-02-21 MED ORDER — FUROSEMIDE 10 MG/ML IJ SOLN
40.0000 mg | Freq: Two times a day (BID) | INTRAMUSCULAR | Status: DC
  Administered 2017-02-21 – 2017-02-25 (×8): 40 mg via INTRAVENOUS
  Filled 2017-02-21 (×9): qty 4

## 2017-02-21 MED ORDER — POLYSACCHARIDE IRON COMPLEX 150 MG PO CAPS
150.0000 mg | ORAL_CAPSULE | Freq: Every day | ORAL | Status: DC
  Administered 2017-02-23 – 2017-02-25 (×3): 150 mg via ORAL
  Filled 2017-02-21 (×3): qty 1

## 2017-02-21 MED ORDER — POLYETHYLENE GLYCOL 3350 17 G PO PACK
17.0000 g | PACK | Freq: Every day | ORAL | Status: DC
  Administered 2017-02-21 – 2017-02-25 (×5): 17 g via ORAL
  Filled 2017-02-21 (×5): qty 1

## 2017-02-21 MED ORDER — MIDAZOLAM HCL 2 MG/2ML IJ SOLN: 2.0000 mg | Freq: Once | INTRAMUSCULAR | Status: AC

## 2017-02-21 MED ORDER — POTASSIUM CHLORIDE CRYS ER 20 MEQ PO TBCR: 20.0000 meq | EXTENDED_RELEASE_TABLET | Freq: Two times a day (BID) | ORAL | Status: DC

## 2017-02-21 MED ORDER — FA-PYRIDOXINE-CYANOCOBALAMIN 2.5-25-2 MG PO TABS
1.0000 | ORAL_TABLET | Freq: Every day | ORAL | Status: DC
  Administered 2017-02-21 – 2017-02-25 (×5): 1 via ORAL
  Filled 2017-02-21 (×5): qty 1

## 2017-02-21 MED ORDER — MORPHINE SULFATE (PF) 4 MG/ML IV SOLN: 2.0000 mg | Freq: Once | INTRAVENOUS | Status: AC

## 2017-02-21 NOTE — Progress Notes (Signed)
Subjective:  Still with good UOP- in good spirits Objective Vital signs in last 24 hours: Vitals:   02/21/17 0400 02/21/17 0500 02/21/17 0600 02/21/17 0700  BP: (!) 91/53 (!) 109/49 115/61 119/74  Pulse: 77 74 79 85  Resp: '17 18 16 19  '$ Temp:      TempSrc:      SpO2: 100% 100% 99% 100%  Weight:   99.2 kg (218 lb 11.2 oz)   Height:       Weight change: 0.402 kg (14.2 oz)  Intake/Output Summary (Last 24 hours) at 02/21/17 0725 Last data filed at 02/21/17 0700  Gross per 24 hour  Intake             3510 ml  Output             1885 ml  Net             1625 ml    Assessment/ Plan: Pt is a 83 y.o. yo male who was admitted on 02/11/2017 with CP- req cath and CABG- has suffered AKI due to contrast and ATN from hypotension  Assessment/Plan: 1. Renal- good UOP and creatinine trending down- crt 1.0 at baseline.  Dialysis was not needed 2. CV- s/p cath/CABG- recovering well  - amiodarone and statin 3. Anemia- ABLA from surgery/blood draws, is a little lower today- follow and transfuse as needed 4. HTN/volume- some volume overloaded- likely reason for low sodium- is normalizing as is diuresing- will decrease lasix some 5. Hypokalemia- given 6 runs yesterday and K went down- will try to replete both PO and IV 6. Patient doing very well from kidney standpoint.  Continue to replete K as needed and when gets close to D/C change lasix to PO.  Renal will sign off, call with questions.  Anticipate continued renal recovery and that he will not need renal specific follow up   [place]: Basic Metabolic Panel:  Recent Labs Lab 02/19/17 0325 02/20/17 0339 02/21/17 0455  NA 129* 130* 131*  K 3.3* 3.3* 3.2*  CL 98* 96* 95*  CO2 '24 25 26  '$ GLUCOSE 147* 92 92  BUN 33* 37* 36*  CREATININE 2.24* 2.08* 1.67*  CALCIUM 8.0* 8.2* 7.9*  PHOS 3.5 3.8 3.0   Liver Function Tests:  Recent Labs Lab 02/14/17 1438 02/18/17 0434 02/19/17 0325 02/20/17 0339 02/21/17 0455  AST 18  20  --  21  --   ALT 11* 12*  --  13*  --   ALKPHOS 64 63  --  93  --   BILITOT 0.5 0.6  --  0.7  --   PROT 5.7* 5.5*  --  5.3*  --   ALBUMIN 3.4* 3.1* 2.6* 2.7* 2.6*   No results for input(s): LIPASE, AMYLASE in the last 168 hours. No results for input(s): AMMONIA in the last 168 hours. CBC:  Recent Labs Lab 02/18/17 0434 02/18/17 2002 02/19/17 0325 02/20/17 0339 02/21/17 0455  WBC 7.4 6.5 5.7 5.3 4.5  HGB 9.1* 9.4* 9.1* 9.5* 8.6*  HCT 27.9* 29.3* 27.3* 28.6* 26.1*  MCV 76.6* 75.1* 75.2* 75.7* 75.9*  PLT 138* 178 164 197 194   Cardiac Enzymes: No results for input(s): CKTOTAL, CKMB, CKMBINDEX, TROPONINI in the last 168 hours. CBG:  Recent Labs Lab 02/19/17 0314 02/19/17 0752 02/20/17 1132 02/20/17 1615 02/20/17 2128  GLUCAP 129* 103* 129* 101* 116*    Iron Studies: No results for input(s): IRON, TIBC, TRANSFERRIN, FERRITIN in the last 72 hours.  Studies/Results: Dg Chest 1v Repeat Same Day  Result Date: 02/19/2017 CLINICAL DATA:  83 year old male status post left-sided chest tube placement for pneumothorax EXAM: CHEST - 1 VIEW SAME DAY COMPARISON:  Prior chest x-ray obtained earlier today at 5:57 a.m. FINDINGS: Interval placement of a left-sided thoracostomy tube with successful re-expansion of the left lung. There is a small residual left apical pneumothorax. Persistent opacification at the left lung base obscuring the diaphragm likely reflects atelectasis. Stable cardiomegaly. Patient is status post median sternotomy with evidence of multivessel CABG. Atherosclerotic calcifications are present in the transverse aorta. Right upper extremity PICC with the catheter tip overlying the superior cavoatrial junction. Low inspiratory volumes. No pulmonary edema. No acute osseous abnormality. IMPRESSION: 1. Near-total resolution of a left-sided pneumothorax following chest tube placement. A trace apical pneumothorax remains. 2. Persistent left lower lobe opacity likely reflecting  left lower lobe atelectasis. 3. Stable position of right upper extremity PICC. 4. Stable cardiomegaly. Electronically Signed   By: Jacqulynn Cadet M.D.   On: 02/19/2017 10:57   Dg Chest Port 1 View  Result Date: 02/21/2017 CLINICAL DATA:  Follow-up pneumothorax with chest tube treatment EXAM: PORTABLE CHEST 1 VIEW COMPARISON:  Portable chest x-ray of Feb 20, 2017 FINDINGS: No definite residual left-sided pneumothorax is observed today. There a left-sided chest tube whose tip projects at the level of the posterior aspect of the fifth rib and is stable. A trace of subcutaneous emphysema on the left is observed. There is decreased density at the left lung base. The right lung is clear. The infrahilar lung markings are coarse but stable. The heart is top-normal in size. The patient has undergone previous median sternotomy. There is calcification in the wall of the aortic arch. The PICC line tip projects over the midportion of the SVC. IMPRESSION: No definite residual pneumothorax is observed on the left. There is improving left basilar atelectasis. Minimal right infrahilar atelectasis is stable. The left chest tube is stable. Stable top-normal cardiac size.  Thoracic aortic atherosclerosis. Electronically Signed   By: David  Martinique M.D.   On: 02/21/2017 07:16   Dg Chest Port 1 View  Result Date: 02/20/2017 CLINICAL DATA:  CABG. EXAM: PORTABLE CHEST 1 VIEW COMPARISON:  Feb 19, 2017 FINDINGS: The right PICC line is stable. A left chest tube remains in place. Subcutaneous air in the left chest wall is stable. The small left apical pneumothorax is smaller in the interval. No right-sided pneumothorax. Improving opacity in the left base. No other interval changes. IMPRESSION: 1. Improving tiny left apical pneumothorax with a left-sided chest tube in place. 2. Improving left basilar opacity. 3. No other change or acute abnormality. Electronically Signed   By: Dorise Bullion III M.D   On: 02/20/2017 07:13    Medications: Infusions: . sodium chloride    . sodium chloride    . lactated ringers    . lactated ringers Stopped (02/16/17 1901)  . lactated ringers Stopped (02/21/17 0600)    Scheduled Medications: . amiodarone  400 mg Oral BID  . aspirin EC  81 mg Oral Daily  . atorvastatin  80 mg Oral q1800  . bisacodyl  10 mg Oral Daily   Or  . bisacodyl  10 mg Rectal Daily  . Chlorhexidine Gluconate Cloth  6 each Topical Daily  . docusate sodium  200 mg Oral Daily  . enoxaparin (LOVENOX) injection  30 mg Subcutaneous QHS  . furosemide  80 mg Intravenous Q12H  . insulin aspart  0-24 Units Subcutaneous TID  AC & HS  . insulin detemir  10 Units Subcutaneous Daily  . pantoprazole  40 mg Oral Daily  . sodium chloride flush  10-40 mL Intracatheter Q12H  . sodium chloride flush  3 mL Intravenous Q12H  . sodium chloride flush  3 mL Intravenous Q12H    have reviewed scheduled and prn medications.  Physical Exam: General: sitting up in chair, making jokes Heart: RRR Lungs: mostly clear- CT Abdomen: soft, non tender Extremities: 1+ edema    02/21/2017,7:25 AM  LOS: 9 days

## 2017-02-21 NOTE — Progress Notes (Signed)
Physical Therapy Treatment Patient Details Name: Vincent Mcclure MRN: [id number] DOB: Feb 23, 1932 Today's Date: 02/21/2017    History of Present Illness Pt adm with chest pain and found to have . Pt s/p CABG x 3 on 5/8.  PMH - HTN, GI bleed, PAD, peripheral neuropathy, ETOH abuse, back surgery    PT Comments    Patient progressing with distance and transitional movements this session.  Feel still would benefit from SNF level rehab at d/c.   Vitals as follows: Pre ambulation: HR 89, RR 22, SpO2 99%, BP 119/76 Post-ambulation: HR 95, RR 18, SpO2 99%, BP 132/67   Follow Up Recommendations  SNF     Equipment Recommendations  None recommended by PT    Recommendations for Other Services       Precautions / Restrictions Precautions Precautions: Sternal;Fall Restrictions Weight Bearing Restrictions: Yes (sternal precautions)    Mobility  Bed Mobility Overal bed mobility: Needs Assistance Bed Mobility: Sit to Supine       Sit to supine: Mod assist   General bed mobility comments: cues for technique throught sidelying, but pt reports still too painful on L side where CT is so assisted to bring legs into bed  Transfers Overall transfer level: Needs assistance Equipment used: 4-wheeled walker (EVA walker) Transfers: Sit to/from Stand Sit to Stand: Min assist;+2 safety/equipment         General transfer comment: assist for forward weight shift, pt hugging pillow  Ambulation/Gait Ambulation/Gait assistance: +2 safety/equipment;Min guard Ambulation Distance (Feet): 370 Feet Assistive device: 4-wheeled walker (EVA walker) Gait Pattern/deviations: Step-through pattern;Decreased stride length;Wide base of support     General Gait Details: assist for safety, equipment   Stairs            Wheelchair Mobility    Modified Rankin (Stroke Patients Only)       Balance Overall balance assessment: Needs assistance Sitting-balance support: Feet supported Sitting  balance-Leahy Scale: Fair     Standing balance support: Bilateral upper extremity supported Standing balance-Leahy Scale: Poor Standing balance comment: minguard when standing without UE support                            Cognition Arousal/Alertness: Awake/alert Behavior During Therapy: WFL for tasks assessed/performed Overall Cognitive Status: Within Functional Limits for tasks assessed                                        Exercises      General Comments        Pertinent Vitals/Pain Pain Assessment: 0-10 Pain Score: 3  Pain Location: chest Pain Descriptors / Indicators: Sore Pain Intervention(s): Monitored during session    Home Living                      Prior Function            PT Goals (current goals can now be found in the care plan section) Progress towards PT goals: Progressing toward goals    Frequency    Min 3X/week      PT Plan Current plan remains appropriate    Co-evaluation              AM-PAC PT "6 Clicks" Daily Activity  Outcome Measure  Difficulty turning over in bed (including adjusting bedclothes, sheets and blankets)?: A Little Difficulty moving from  lying on back to sitting on the side of the bed? : Total Difficulty sitting down on and standing up from a chair with arms (e.g., wheelchair, bedside commode, etc,.)?: Total Help needed moving to and from a bed to chair (including a wheelchair)?: A Little Help needed walking in hospital room?: A Little Help needed climbing 3-5 steps with a railing? : A Lot 6 Click Score: 13    End of Session Equipment Utilized During Treatment: Gait belt Activity Tolerance: Patient tolerated treatment well Patient left: in bed;with call bell/phone within reach   PT Visit Diagnosis: Muscle weakness (generalized) (M62.81);Difficulty in walking, not elsewhere classified (R26.2)     Time: 8563-1497 PT Time Calculation (min) (ACUTE ONLY): 28 min  Charges:   $Gait Training: 8-22 mins $Therapeutic Activity: 8-22 mins                    G CodesMagda Kiel, [place] [phone] 02/21/2017    Reginia Naas 02/21/2017, 9:48 AM

## 2017-02-21 NOTE — Clinical Social Work Note (Signed)
Clinical Social Work Assessment  Patient Details  Name: Vincent Mcclure MRN: [id number] Date of Birth: 1932-02-27  Date of referral:  02/21/17               Reason for consult:  Facility Placement                Permission sought to share information with:  Chartered certified accountant granted to share information::  Yes, Verbal Permission Granted  Name::     Writer::  SNF  Relationship::  wife  Contact Information:     Housing/Transportation Living arrangements for the past 2 months:  [place] of Information:  Patient, Spouse Patient Interpreter Needed:  None Criminal Activity/Legal Involvement Pertinent to Current Situation/Hospitalization:  No - Comment as needed Significant Relationships:  Spouse Lives with:  Spouse Do you feel safe going back to the place where you live?  No Need for family participation in patient care:  No (Coment)  Care giving concerns:  Pt lives at home with wife who is available almost 24 hours a day.  Patient presenting after open heart surgery and needing to follow sternal precautions- pt wife unsure if she is capable of providing needed level of assistance.   Social Worker assessment / plan:  CSW met with patient and pt wife concerning PT current recommendation for SNF.  CSW explained SNF and SNF referral process.  CSW explained insurance coverage of SNF and need for prior authorization to go to SNF.  Employment status:  Retired Nurse, adult PT Recommendations:  [place] / Referral to community resources:  [facility]  Patient/Family's Response to care:  Patient very unsure about SNF recommendation at this time.  Patient not interested in going forward with SNF process until he is almost ready for DC.  CSW explained that referral was not a commitment.  Patient thinking he can likely manage at home with assistance from wife and neighbors.  Wife  thinks that SNF could be a need for patient since she is not capable of offering much physical assistance. Wife insists that CSW begin referral process.  Patient/Family's Understanding of and Emotional Response to Diagnosis, Current Treatment, and Prognosis:  Pt seems to have reasonable understanding of condition but seems somewhat stubborn and not willing to think critically about discharge planning at this time.  Emotional Assessment Appearance:  Appears stated age Attitude/Demeanor/Rapport:  Avoidant Affect (typically observed):  Appropriate Orientation:  Oriented to Place, Oriented to Self, Oriented to  Time, Oriented to Situation Alcohol / Substance use:  Alcohol Use Psych involvement (Current and /or in the community):  No (Comment)  Discharge Needs  Concerns to be addressed:  Care Coordination Readmission within the last 30 days:  No Current discharge risk:  Physical Impairment Barriers to Discharge:  Continued Medical Work up   Jorge Ny, LCSW 02/21/2017, 3:41 PM

## 2017-02-21 NOTE — [place] FL2 (Signed)
[place] MEDICAID FL2 LEVEL OF CARE SCREENING TOOL     IDENTIFICATION  Patient Name: Vincent Mcclure Birthdate: January 27, 1932 Sex: male Admission Date (Current Location): 02/11/2017  [facility] and [place] Number:  Herbalist and Address:  The . [facility], [place] [address] [place], [place] [address]      Provider Number: [id number]  Attending Physician Name and Address:  Grace Isaac, MD  Relative Name and Phone Number:       Current Level of Care: Hospital Recommended Level of Care: [place] Prior Approval Number:    Date Approved/Denied:   PASRR Number: [id number]  Discharge Plan: SNF    Current Diagnoses: Patient Active Problem List   Diagnosis Date Noted  . PAF (paroxysmal atrial fibrillation) ([place])   . 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 NSTEMI (non-ST elevated myocardial infarction) ([place]) 02/12/2017  . Chest pain 02/11/2017  . Overweight (BMI 25.0-29.9) 09/29/2016  . Alcohol use/ h/o heavy abuse 08/11/2016  . Left shoulder pain 01/24/2015  . GI bleed 11/14/2014  . h/o Anemia due to blood loss, acute 11/14/2014  . Eye pain   . Acute esophagitis   . Acute gastric ulcer   . Duodenal ulcer disease   . Upper GI bleed 11/13/2014  . Lumbar disc disease 01/30/2014  . Hearing loss 03/23/2013  . Screening for other and unspecified cardiovascular conditions 03/23/2013  . PAD (peripheral artery disease) ([place]) 10/14/2011  . Chronic low back pain 10/14/2011  . Glucose intolerance (impaired glucose tolerance) 10/12/2011  . Preventative health care 10/12/2011  . LEG CRAMPS 09/02/2010  . PERIPHERAL EDEMA 09/02/2010  . VARICOSE VEINS, LOWER EXTREMITIES 06/09/2009  . HLD (hyperlipidemia) 03/05/2008  . Anemia, iron deficiency 03/05/2008  . Hereditary and idiopathic peripheral neuropathy 03/05/2008  . HTN (hypertension) 03/05/2008  . GERD 03/05/2008  . IBS 03/05/2008  . Blood in stool 03/05/2008  . COLONIC POLYPS, HX OF 03/05/2008     Orientation RESPIRATION BLADDER Height & Weight     Self, Time, Situation, Place  Normal Continent Weight: 218 lb 11.2 oz (99.2 kg) Height:  '5\' 11"'$  (180.3 cm)  BEHAVIORAL SYMPTOMS/MOOD NEUROLOGICAL BOWEL NUTRITION STATUS      Continent Diet (regular)  AMBULATORY STATUS COMMUNICATION OF NEEDS Skin   Limited Assist Verbally Surgical wounds                       Personal Care Assistance Level of Assistance  Bathing, Dressing Bathing Assistance: Limited assistance   Dressing Assistance: Limited assistance     Functional Limitations Info             SPECIAL CARE FACTORS FREQUENCY  PT (By licensed PT), OT (By licensed OT)     PT Frequency: 5/wk OT Frequency: 5/wk            Contractures      Additional Factors Info  Code Status, Allergies Code Status Info: FULL Allergies Info: NKA           Current Medications (02/21/2017):  This is the current hospital active medication list Current Facility-Administered Medications  Medication Dose Route Frequency Provider Last Rate Last Dose  . 0.9 %  sodium chloride infusion  250 mL Intravenous Continuous Conte, Tessa N, PA-C      . 0.9 %  sodium chloride infusion  250 mL Intravenous PRN Rexene Alberts, MD      . aspirin EC tablet 81 mg  81 mg Oral Daily Gerhardt,  Lilia Argue, MD   81 mg at 02/21/17 0854  . bisacodyl (DULCOLAX) EC tablet 10 mg  10 mg Oral Daily Nicholes Rough N, PA-C   10 mg at 02/21/17 7048   Or  . bisacodyl (DULCOLAX) suppository 10 mg  10 mg Rectal Daily Harriet Pho, Tessa N, PA-C      . Chlorhexidine Gluconate Cloth 2 % PADS 6 each  6 each Topical Daily Grace Isaac, MD   6 each at 02/21/17 1000  . docusate sodium (COLACE) capsule 200 mg  200 mg Oral Daily Nicholes Rough N, PA-C   200 mg at 02/21/17 8891  . enoxaparin (LOVENOX) injection 30 mg  30 mg Subcutaneous QHS Grace Isaac, MD   30 mg at 69/45/03 8882  . folic acid-pyridoxine-cyancobalamin (FOLTX) 2.5-25-2 MG per tablet 1 tablet  1 tablet  Oral Daily Rexene Alberts, MD   1 tablet at 02/21/17 0901  . furosemide (LASIX) injection 40 mg  40 mg Intravenous Q12H Corliss Parish, MD      . insulin aspart (novoLOG) injection 0-24 Units  0-24 Units Subcutaneous TID AC & HS Rexene Alberts, MD   2 Units at 02/20/17 1249  . [START ON 02/23/2017] iron polysaccharides (NIFEREX) capsule 150 mg  150 mg Oral Daily Rexene Alberts, MD      . lactated ringers infusion   Intravenous Continuous Harriet Pho, Tessa N, PA-C      . lactated ringers infusion   Intravenous Continuous Elgie Collard, [place]   Stopped at 02/16/17 1901  . lactated ringers infusion   Intravenous Continuous Grace Isaac, MD   Stopped at 02/21/17 0600  . metoprolol (LOPRESSOR) injection 2.5-5 mg  2.5-5 mg Intravenous Q2H PRN Harriet Pho, Tessa N, PA-C      . metoprolol tartrate (LOPRESSOR) tablet 12.5 mg  12.5 mg Oral BID Rexene Alberts, MD   12.5 mg at 02/21/17 0853  . ondansetron (ZOFRAN) injection 4 mg  4 mg Intravenous Q6H PRN Elgie Collard, PA-C   4 mg at 02/20/17 1614  . oxyCODONE (Oxy IR/ROXICODONE) immediate release tablet 5 mg  5 mg Oral Q3H PRN Grace Isaac, MD   5 mg at 02/21/17 0855  . pantoprazole (PROTONIX) EC tablet 40 mg  40 mg Oral BID Rexene Alberts, MD   40 mg at 02/21/17 0854  . polyethylene glycol (MIRALAX / GLYCOLAX) packet 17 g  17 g Oral Daily Rexene Alberts, MD   17 g at 02/21/17 0855  . [START ON 02/23/2017] pravastatin (PRAVACHOL) tablet 40 mg  40 mg Oral q1800 Rexene Alberts, MD      . sodium chloride flush (NS) 0.9 % injection 10-40 mL  10-40 mL Intracatheter Q12H Grace Isaac, MD   10 mL at 02/21/17 0855  . sodium chloride flush (NS) 0.9 % injection 10-40 mL  10-40 mL Intracatheter PRN Grace Isaac, MD      . sodium chloride flush (NS) 0.9 % injection 3 mL  3 mL Intravenous Q12H Conte, Tessa N, PA-C   3 mL at 02/20/17 0943  . sodium chloride flush (NS) 0.9 % injection 3 mL  3 mL Intravenous PRN Harriet Pho, Tessa N, PA-C      .  sodium chloride flush (NS) 0.9 % injection 3 mL  3 mL Intravenous Q12H Rexene Alberts, MD   3 mL at 02/21/17 0856  . sodium chloride flush (NS) 0.9 % injection 3 mL  3 mL Intravenous PRN Darylene Price  H, MD      . traMADol (ULTRAM) tablet 50 mg  50 mg Oral Q4H PRN Grace Isaac, MD   50 mg at 02/20/17 0315     Discharge Medications: Please see discharge summary for a list of discharge medications.  Relevant Imaging Results:  Relevant Lab Results:   Additional Information SS#: [id number]  Jorge Ny, LCSW

## 2017-02-21 NOTE — Progress Notes (Signed)
CARDIAC REHAB PHASE I   PRE:  Rate/Rhythm: 79 SR  BP:  Supine:   Sitting: 119/60  Standing:    SaO2: 100%2L  MODE:  Ambulation: 350 ft   POST:  Rate/Rhythm: 103 ST  BP:  Supine:   Sitting: 136/81  Standing:    SaO2: 100% 2L 1350-1430 Pt very talkative. Walked 350 ft on 2L with asst x 2 and rolling walker. Had to rock to stand. Needed some assistance to stand. Gait fairly steady. Back to bed after walk. Chest tube intact. Will walk on RA tomorrow.   Graylon Good, RN BSN  02/21/2017 2:25 PM

## 2017-02-21 NOTE — Progress Notes (Addendum)
[address]       [place] [address]             [phone]        CARDIOTHORACIC SURGERY PROGRESS NOTE   R6 Days Post-Op Procedure(s) (LRB): CORONARY ARTERY BYPASS GRAFTING times three  with left internal mammary harvest and endoscopic harvest of Right SVG. Grafts of LIMA to  LAD, SVG to Distal Circ, and to First Diag. (N/A) TRANSESOPHAGEAL ECHOCARDIOGRAM (TEE) (N/A)  Subjective: Feels better today but has complained of nausea ever since he was started on oral amiodarone.  No abdominal pain.  Denies SOB  Objective: Vital signs: BP Readings from Last 1 Encounters:  02/21/17 119/74   Pulse Readings from Last 1 Encounters:  02/21/17 85   Resp Readings from Last 1 Encounters:  02/21/17 19   Temp Readings from Last 1 Encounters:  02/21/17 98.2 F (36.8 C) (Oral)    Hemodynamics:    Physical Exam:  Rhythm:   sinus  Breath sounds: Clear   Heart sounds:  RRR  Incisions:  Clean and dry  Abdomen:  Soft, non-distended, non-tender  Extremities:  Warm, well-perfused, + edema  Chest tubes:  Decreasing but significant volume thin serosanguinous output, no air leak    Intake/Output from previous day: 05/13 0701 - 05/14 0700 In: 3510 [P.O.:2820; I.V.:440; IV Piggyback:250] Out: 1885 [Urine:1625; Chest Tube:260] Intake/Output this shift: No intake/output data recorded.  Lab Results:  CBC: Recent Labs  02/20/17 0339 02/21/17 0455  WBC 5.3 4.5  HGB 9.5* 8.6*  HCT 28.6* 26.1*  PLT 197 194    BMET:  Recent Labs  02/20/17 0339 02/21/17 0455  NA 130* 131*  K 3.3* 3.2*  CL 96* 95*  CO2 25 26  GLUCOSE 92 92  BUN 37* 36*  CREATININE 2.08* 1.67*  CALCIUM 8.2* 7.9*     PT/INR:  No results for input(s): LABPROT, INR in the last 72 hours.  CBG (last 3)   Recent Labs  02/20/17 1132 02/20/17 1615 02/20/17 2128  GLUCAP 129* 101* 116*    ABG    Component Value Date/Time   PHART 7.333 (L) 02/15/2017 1853   PCO2ART 36.5 02/15/2017  1853   PO2ART 91.0 02/15/2017 1853   HCO3 19.4 (L) 02/15/2017 1853   TCO2 24 02/16/2017 1610   ACIDBASEDEF 6.0 (H) 02/15/2017 1853   O2SAT 59.3 02/18/2017 0430    CXR: PORTABLE CHEST 1 VIEW  COMPARISON:  Portable chest x-ray of Feb 20, 2017  FINDINGS: No definite residual left-sided pneumothorax is observed today. There a left-sided chest tube whose tip projects at the level of the posterior aspect of the fifth rib and is stable. A trace of subcutaneous emphysema on the left is observed. There is decreased density at the left lung base. The right lung is clear. The infrahilar lung markings are coarse but stable. The heart is top-normal in size. The patient has undergone previous median sternotomy. There is calcification in the wall of the aortic arch. The PICC line tip projects over the midportion of the SVC.  IMPRESSION: No definite residual pneumothorax is observed on the left. There is improving left basilar atelectasis. Minimal right infrahilar atelectasis is stable. The left chest tube is stable.  Stable top-normal cardiac size.  Thoracic aortic atherosclerosis.   Electronically Signed   By: David  Martinique M.D.   On: 02/21/2017 07:16  Assessment/Plan: S/P Procedure(s) (LRB): CORONARY ARTERY BYPASS GRAFTING times three  with left internal mammary harvest and  endoscopic harvest of Right SVG. Grafts of LIMA to  LAD, SVG to Distal Circ, and to First Diag. (N/A) TRANSESOPHAGEAL ECHOCARDIOGRAM (TEE) (N/A)  Overall stable POD6 Maintaining NSR w/ stable BP  Breathing comfortably w/ O2 sats 99-100% on room air Left pneumothorax, resolved after chest tube placement, chest tube still draining serous fluid Non-oliguric ARF resolving, likely due to prerenal azotemia +/- acute kidney injury caused by ATN with baseline CKD preop - creatinine down further this morning and good UOP Hypokalemia, induced by loop diuretics Expected post op acute blood loss anemia, Hgb down  slightly 8.6 Chronic combined systolic and diastolic CHF with expected post-op volume excess, weight still 10 lbs > preop   Stop amiodarone and restart low dose metoprolol  Leave chest tube in place for now  Mobilize  Diuresis  Decrease lasix dose  Add iron and folate, watch Hgb  Transfer step down  Rexene Alberts, MD 02/21/2017 8:26 AM

## 2017-02-21 NOTE — Progress Notes (Signed)
Pt. Called because he was in pain, discovered his CT was disconnected at the connector piece. Reattached line and called E.Barrett, PA to make aware and place Cxray. Pt. Is without pai  At this time and SPO2 is 100% on RA.

## 2017-02-22 ENCOUNTER — Inpatient Hospital Stay (HOSPITAL_COMMUNITY): Payer: Medicare HMO

## 2017-02-22 LAB — RENAL FUNCTION PANEL
Albumin: 2.6 g/dL — ABNORMAL LOW (ref 3.5–5.0)
Anion gap: 9 (ref 5–15)
BUN: 33 mg/dL — AB (ref 6–20)
CO2: 25 mmol/L (ref 22–32)
CREATININE: 1.54 mg/dL — AB (ref 0.61–1.24)
Calcium: 8.2 mg/dL — ABNORMAL LOW (ref 8.9–10.3)
Chloride: 96 mmol/L — ABNORMAL LOW (ref 101–111)
GFR calc Af Amer: 46 mL/min — ABNORMAL LOW (ref >60)
GFR calc non Af Amer: 40 mL/min — ABNORMAL LOW (ref >60)
GLUCOSE: 91 mg/dL (ref 65–99)
Phosphorus: 2.8 mg/dL (ref 2.5–4.6)
Potassium: 3.6 mmol/L (ref 3.5–5.1)
SODIUM: 130 mmol/L — AB (ref 135–145)

## 2017-02-22 LAB — BASIC METABOLIC PANEL
ANION GAP: 9 (ref 5–15)
BUN: 33 mg/dL — ABNORMAL HIGH (ref 6–20)
CHLORIDE: 96 mmol/L — AB (ref 101–111)
CO2: 24 mmol/L (ref 22–32)
Calcium: 8.2 mg/dL — ABNORMAL LOW (ref 8.9–10.3)
Creatinine, Ser: 1.54 mg/dL — ABNORMAL HIGH (ref 0.61–1.24)
GFR calc Af Amer: 46 mL/min — ABNORMAL LOW (ref >60)
GFR, EST NON AFRICAN AMERICAN: 40 mL/min — AB (ref >60)
GLUCOSE: 91 mg/dL (ref 65–99)
POTASSIUM: 3.6 mmol/L (ref 3.5–5.1)
Sodium: 129 mmol/L — ABNORMAL LOW (ref 135–145)

## 2017-02-22 LAB — GLUCOSE, CAPILLARY
GLUCOSE-CAPILLARY: 93 mg/dL (ref 65–99)
GLUCOSE-CAPILLARY: 117 mg/dL — AB (ref 65–99)
Glucose-Capillary: 137 mg/dL — ABNORMAL HIGH (ref 65–99)
Glucose-Capillary: 128 mg/dL — ABNORMAL HIGH (ref 65–99)

## 2017-02-22 LAB — CBC
HCT: 26.6 % — ABNORMAL LOW (ref 39.0–52.0)
HEMOGLOBIN: 8.4 g/dL — AB (ref 13.0–17.0)
MCH: 23.9 pg — AB (ref 26.0–34.0)
MCHC: 31.6 g/dL (ref 30.0–36.0)
MCV: 75.8 fL — AB (ref 78.0–100.0)
PLATELETS: 244 10*3/uL (ref 150–400)
RBC: 3.51 MIL/uL — AB (ref 4.22–5.81)
RDW: 17.4 % — ABNORMAL HIGH (ref 11.5–15.5)
WBC: 5.7 10*3/uL (ref 4.0–10.5)

## 2017-02-22 MED ORDER — METOPROLOL TARTRATE 25 MG PO TABS
25.0000 mg | ORAL_TABLET | Freq: Two times a day (BID) | ORAL | Status: DC
  Administered 2017-02-22 – 2017-02-25 (×7): 25 mg via ORAL
  Filled 2017-02-22 (×7): qty 1

## 2017-02-22 NOTE — Progress Notes (Signed)
PT Cancellation Note  Patient Details Name: Vincent Mcclure MRN: [id number] DOB: 01-Feb-1932   Cancelled Treatment:    Reason Eval/Treat Not Completed: Other (comment) (pt finishing breakfast and states he has to wait at least 30 min after meals due to N/V. Will attempt near 10am)   [name] B [name] 02/22/2017, 8:02 AM  [name], [name]

## 2017-02-22 NOTE — Progress Notes (Signed)
Physical Therapy Treatment Patient Details Name: Vincent Mcclure MRN: [id number] DOB: June 04, 1932 Today's Date: 02/22/2017    History of Present Illness Pt adm with chest pain and found to have NSTEMI . Pt s/p CABG x 3 on 5/8.  PMH - HTN, GI bleed, PAD, peripheral neuropathy, ETOH abuse, back surgery    PT Comments    Pt very jovial and agreeable to mobility. Pt educated for sternal precautions, transfers, gait and progression. Pt with increased gait distance today and reports no pain with mobility. Will continue to follow to maximize independence.  HR 100-103   Follow Up Recommendations  SNF;Supervision for mobility/OOB     Equipment Recommendations  None recommended by PT    Recommendations for Other Services       Precautions / Restrictions Precautions Precautions: Sternal;Fall Precaution Comments: chest tube Restrictions Weight Bearing Restrictions: Yes    Mobility  Bed Mobility Overal bed mobility: Needs Assistance Bed Mobility: Supine to Sit     Supine to sit: Min assist     General bed mobility comments: cues for sequence with HOB 25degrees with pt prefering to pivot legs to EOB then semi roll to side and sit with assist for trunk despite cues for ease of sequence  Transfers Overall transfer level: Needs assistance   Transfers: Sit to/from Stand Sit to Stand: Min assist         General transfer comment: cues for hand placement, sequence and assist for anterior translation and rise  Ambulation/Gait Ambulation/Gait assistance: Min guard Ambulation Distance (Feet): 500 Feet Assistive device: Rolling walker (2 wheeled) Gait Pattern/deviations: Step-through pattern;Decreased stride length     General Gait Details: cues for posture and position in RW   Stairs            Wheelchair Mobility    Modified Rankin (Stroke Patients Only)       Balance Overall balance assessment: Needs assistance   Sitting balance-Leahy Scale: Good        Standing balance-Leahy Scale: Fair                              Cognition Arousal/Alertness: Awake/alert Behavior During Therapy: WFL for tasks assessed/performed Overall Cognitive Status: Impaired/Different from baseline Area of Impairment: Memory                     Memory: Decreased recall of precautions         General Comments: pt unaware of precautions on arrival, educated and able to recall 4/5 end of session      Exercises      General Comments        Pertinent Vitals/Pain Pain Assessment: No/denies pain    Home Living                      Prior Function            PT Goals (current goals can now be found in the care plan section) Progress towards PT goals: Progressing toward goals    Frequency           PT Plan Current plan remains appropriate    Co-evaluation              AM-PAC PT "6 Clicks" Daily Activity  Outcome Measure  Difficulty turning over in bed (including adjusting bedclothes, sheets and blankets)?: A Little Difficulty moving from lying on back to sitting on the side of  the bed? : Total Difficulty sitting down on and standing up from a chair with arms (e.g., wheelchair, bedside commode, etc,.)?: Total Help needed moving to and from a bed to chair (including a wheelchair)?: A Little Help needed walking in hospital room?: A Little Help needed climbing 3-5 steps with a railing? : A Lot 6 Click Score: 13    End of Session Equipment Utilized During Treatment: Gait belt Activity Tolerance: Patient tolerated treatment well Patient left: in chair;with call bell/phone within reach;with chair alarm set Nurse Communication: Mobility status PT Visit Diagnosis: Muscle weakness (generalized) (M62.81);Difficulty in walking, not elsewhere classified (R26.2)     Time: 9371-6967 PT Time Calculation (min) (ACUTE ONLY): 32 min  Charges:  $Gait Training: 8-22 mins $Therapeutic Activity: 8-22 mins                     G Codes:       [name], PT [phone]   [place] 02/22/2017, 11:41 AM

## 2017-02-22 NOTE — Progress Notes (Addendum)
[address]       [place] [address]             [phone]      7 Days Post-Op Procedure(s) (LRB): CORONARY ARTERY BYPASS GRAFTING times three  with left internal mammary harvest and endoscopic harvest of Right SVG. Grafts of LIMA to  LAD, SVG to Distal Circ, and to First Diag. (N/A) TRANSESOPHAGEAL ECHOCARDIOGRAM (TEE) (N/A) Subjective: Feels pretty good this morning. No issues overnight.   Objective: Vital signs in last 24 hours: Temp:  [97.5 F (36.4 C)-99.3 F (37.4 C)] 99.3 F (37.4 C) (05/15 0346) Pulse Rate:  [79-99] 94 (05/15 0346) Cardiac Rhythm: Normal sinus rhythm (05/14 1900) Resp:  [15-20] 18 (05/15 0346) BP: (105-142)/(61-92) 136/72 (05/15 0346) SpO2:  [97 %-100 %] 97 % (05/15 0346) Weight:  [98.3 kg (216 lb 12.8 oz)] 98.3 kg (216 lb 12.8 oz) (05/15 0346)     Intake/Output from previous day: 05/14 0701 - 05/15 0700 In: 130 [I.V.:30; IV Piggyback:100] Out: 2715 [Urine:2675; Chest Tube:40] Intake/Output this shift: No intake/output data recorded.  General appearance: alert, cooperative and no distress Heart: regular rate and rhythm, S1, S2 normal, no murmur, click, rub or gallop Lungs: clear to auscultation bilaterally Abdomen: soft, non-tender; bowel sounds normal; no masses,  no organomegaly Extremities: extremities normal, atraumatic, no cyanosis or edema Wound: clean and dry  Lab Results:  Recent Labs  02/21/17 0455 02/22/17 0449  WBC 4.5 5.7  HGB 8.6* 8.4*  HCT 26.1* 26.6*  PLT 194 244   BMET:  Recent Labs  02/21/17 0455 02/22/17 0449  NA 131* 130*  129*  K 3.2* 3.6  3.6  CL 95* 96*  96*  CO2 '26 25  24  '$ GLUCOSE 92 91  91  BUN 36* 33*  33*  CREATININE 1.67* 1.54*  1.54*  CALCIUM 7.9* 8.2*  8.2*    PT/INR: No results for input(s): LABPROT, INR in the last 72 hours. ABG    Component Value Date/Time   PHART 7.333 (L) 02/15/2017 1853   HCO3 19.4 (L) 02/15/2017 1853   TCO2 24 02/16/2017 1610   ACIDBASEDEF 6.0 (H) 02/15/2017 1853   O2SAT 55.5 02/19/2017 0320   CBG (last 3)   Recent Labs  02/21/17 1706 02/21/17 2103 02/22/17 0607  GLUCAP 96 124* 93    Assessment/Plan: S/P Procedure(s) (LRB): CORONARY ARTERY BYPASS GRAFTING times three  with left internal mammary harvest and endoscopic harvest of Right SVG. Grafts of LIMA to  LAD, SVG to Distal Circ, and to First Diag. (N/A) TRANSESOPHAGEAL ECHOCARDIOGRAM (TEE) (N/A)  1. CV-NSR in the 90s. BP well controlled, will increase Lopressor. Occasional irregularity on tele. Will leave wires today.  2. Pulm-tolerating room air with good oxygen saturation. Encourage incentive spirometry. Chest tube remains with little output. CXR from last night showed a small left pneumothorax. Will order a CXR this morning 3. Renal-creatinine trending down and is now 1.54. Electrolytes okay. Good urine output. Weight continues to trend down. Remains 3kg over baseline.  4. Expected acute blood loss anemia- H and H stable. 5. Endo- blood glucose level well controlled on current regimen.   Plan: CXR showed: Left-sided chest tube remains in place with small left apical pneumothorax (approximately 5%), minimally larger. Tiny amount of gas seen along the inferior left heart border has cleared-cont chest tube. Continue to ambulate TID. Encourage incentive spirometry. Continue diuretics for fluid overload.     LOS: 10 days    Johann Capers  Doroteo Glassman 02/22/2017

## 2017-02-22 NOTE — Care Management Important Message (Signed)
Important Message  Patient Details  Name: Vincent Mcclure MRN: [id number] Date of Birth: [birth year]   Medicare Important Message Given:  Yes    [name] Abena 02/22/2017, 11:08 AM

## 2017-02-22 NOTE — Progress Notes (Signed)
CARDIAC REHAB PHASE I   PRE:  Rate/Rhythm: 79 SR  BP:  Supine:   Sitting: 124/66  Standing:    SaO2: 98%RA  MODE:  Ambulation: 490 ft   POST:  Rate/Rhythm: 97  BP:  Supine:   Sitting: 124/70  Standing:    SaO2: 97%RA 1350-1425 Pt had large BM prior to walk.  Pt walked 490 ft on RA with rolling walker and asst x 2. Pt so talkative that he becomes SOB at times. Took a couple of standing rest breaks. To recliner after walk. Can be asst x 1. Chest tube intact.   Graylon Good, RN BSN  02/22/2017 2:21 PM

## 2017-02-23 ENCOUNTER — Inpatient Hospital Stay (HOSPITAL_COMMUNITY): Payer: Medicare HMO

## 2017-02-23 LAB — CREATININE, SERUM
Creatinine, Ser: 1.47 mg/dL — ABNORMAL HIGH (ref 0.61–1.24)
GFR calc Af Amer: 49 mL/min — ABNORMAL LOW (ref >60)
GFR calc non Af Amer: 42 mL/min — ABNORMAL LOW (ref >60)

## 2017-02-23 LAB — GLUCOSE, CAPILLARY
GLUCOSE-CAPILLARY: 104 mg/dL — AB (ref 65–99)
GLUCOSE-CAPILLARY: 145 mg/dL — AB (ref 65–99)
GLUCOSE-CAPILLARY: 120 mg/dL — AB (ref 65–99)
Glucose-Capillary: 114 mg/dL — ABNORMAL HIGH (ref 65–99)

## 2017-02-23 MED ORDER — AMIODARONE HCL 200 MG PO TABS
400.0000 mg | ORAL_TABLET | Freq: Two times a day (BID) | ORAL | Status: DC
  Administered 2017-02-23 – 2017-02-25 (×5): 400 mg via ORAL
  Filled 2017-02-23 (×6): qty 2

## 2017-02-23 NOTE — Progress Notes (Signed)
CARDIAC REHAB PHASE I   PRE:  Rate/Rhythm: 82 SR  BP:  Sitting: 114/65        SaO2: 96 RA  MODE:  Ambulation: 590 ft   POST:  Rate/Rhythm: 83 SR  BP:  Sitting: 120/71         SaO2: 97 RA  Pt ambulated 590 ft on RA, rolling walker, IV, chest tube, assist x1, slow, steady gait, tolerated fairly well. Pt become short of breath with continued conversation, states he feels better today than yesterday, standing rest x2. Sats good on RA. Increased distance today. Encouraged additional ambulation today.  Pt to recliner after walk, call bell within reach, will follow.    [phone] Lenna Sciara, RN, BSN 02/23/2017 12:09 PM

## 2017-02-23 NOTE — Discharge Instructions (Signed)
Coronary Artery Bypass Grafting, Care After  This sheet gives you information about how to care for yourself after your procedure. Your health care provider may also give you more specific instructions. If you have problems or questions, contact your health care provider. What can I expect after the procedure? After the procedure, it is common to have:  Nausea and a lack of appetite.  Constipation.  Weakness and fatigue.  Depression or irritability.  Pain or discomfort in your incision areas. Follow these instructions at home: Medicines   Take over-the-counter and prescription medicines only as told by your health care provider. Do not stop taking medicines or start any new medicines without approval from your health care provider.  If you were prescribed an antibiotic medicine, take it as told by your health care provider. Do not stop taking the antibiotic even if you start to feel better.  Do not drive or use heavy machinery while taking prescription pain medicine. Incision care   Follow instructions from your health care provider about how to take care of your incisions. Make sure you:  Wash your hands with soap and water before you change your bandage (dressing). If soap and water are not available, use hand sanitizer.  Change your dressing as told by your health care provider.  Leave stitches (sutures), skin glue, or adhesive strips in place. These skin closures may need to stay in place for 2 weeks or longer. If adhesive strip edges start to loosen and curl up, you may trim the loose edges. Do not remove adhesive strips completely unless your health care provider tells you to do that.  Keep incision areas clean, dry, and protected.  Check your incision areas every day for signs of infection. Check for:  More redness, swelling, or pain.  More fluid or blood.  Warmth.  Pus or a bad smell.  If incisions were made in your legs:  Avoid crossing your legs.  Avoid  sitting for long periods of time. Change positions every 30 minutes.  Raise (elevate) your legs when you are sitting. Bathing   Do not take baths, swim, or use a hot tub until your health care provider approves.  Only take sponge baths. Pat the incisions dry. Do not rub incisions with a washcloth or towel.  Ask your health care provider when you can shower. Eating and drinking   Eat foods that are high in fiber, such as raw fruits and vegetables, whole grains, beans, and nuts. Meats should be lean cut. Avoid canned, processed, and fried foods. This can help prevent constipation and is a recommended part of a heart-healthy diet.  Drink enough fluid to keep your urine clear or pale yellow.  Limit alcohol intake to no more than 1 drink a day for nonpregnant women and 2 drinks a day for men. One drink equals 12 oz of beer, 5 oz of wine, or 1 oz of hard liquor. Activity   Rest and limit your activity as told by your health care provider. You may be instructed to:  Stop any activity right away if you have chest pain, shortness of breath, irregular heartbeats, or dizziness. Get help right away if you have any of these symptoms.  Move around frequently for short periods or take short walks as directed by your health care provider. Gradually increase your activities. You may need physical therapy or cardiac rehabilitation to help strengthen your muscles and build your endurance.  Avoid lifting, pushing, or pulling anything that is heavier than 10  lb (4.5 kg) for at least 6 weeks or as told by your health care provider.  Do not drive until your health care provider approves.  Ask your health care provider when you may return to work.  Ask your health care provider when you may resume sexual activity. General instructions   Do not use any products that contain nicotine or tobacco, such as cigarettes and e-cigarettes. If you need help quitting, ask your health care provider.  Take 2-3 deep  breaths every few hours during the day, while you recover. This helps expand your lungs and prevent complications like pneumonia after surgery.  If you were given a device called an incentive spirometer, use it several times a day to practice deep breathing. Support your chest with a pillow or your arms when you take deep breaths or cough.  Wear compression stockings as told by your health care provider. These stockings help to prevent blood clots and reduce swelling in your legs.  Weigh yourself every day. This helps identify if your body is holding (retaining) fluid that may make your heart and lungs work harder.  Keep all follow-up visits as told by your health care provider. This is important. Contact a health care provider if:  You have more redness, swelling, or pain around any incision.  You have more fluid or blood coming from any incision.  Any incision feels warm to the touch.  You have pus or a bad smell coming from any incision  You have a fever.  You have swelling in your ankles or legs.  You have pain in your legs.  You gain 2 lb (0.9 kg) or more a day.  You are nauseous or you vomit.  You have diarrhea. Get help right away if:  You have chest pain that spreads to your jaw or arms.  You are short of breath.  You have a fast or irregular heartbeat.  You notice a "clicking" in your breastbone (sternum) when you move.  You have numbness or weakness in your arms or legs.  You feel dizzy or light-headed. Summary  After the procedure, it is common to have pain or discomfort in the incision areas.  Do not take baths, swim, or use a hot tub until your health care provider approves.  Gradually increase your activities. You may need physical therapy or cardiac rehabilitation to help strengthen your muscles and build your endurance.  Weigh yourself every day. This helps identify if your body is holding (retaining) fluid that may make your heart and lungs work  harder. This information is not intended to replace advice given to you by your health care provider. Make sure you discuss any questions you have with your health care provider. Document Released: 04/16/2005 Document Revised: 08/16/2016 Document Reviewed: 08/16/2016 Elsevier Interactive Patient Education  2017 [employer].

## 2017-02-23 NOTE — Progress Notes (Addendum)
[address]       [place] [address]             [phone]      8 Days Post-Op Procedure(s) (LRB): CORONARY ARTERY BYPASS GRAFTING times three  with left internal mammary harvest and endoscopic harvest of Right SVG. Grafts of LIMA to  LAD, SVG to Distal Circ, and to First Diag. (N/A) TRANSESOPHAGEAL ECHOCARDIOGRAM (TEE) (N/A) Subjective: No issues overnight. Is planning to walk several times today.   Objective: Vital signs in last 24 hours: Temp:  [97.3 F (36.3 C)-98.7 F (37.1 C)] 98.3 F (36.8 C) (05/16 0429) Pulse Rate:  [76-118] 76 (05/16 0429) Cardiac Rhythm: Normal sinus rhythm (05/16 0752) Resp:  [18] 18 (05/16 0429) BP: (108-140)/(66-87) 133/66 (05/16 0429) SpO2:  [96 %-100 %] 96 % (05/16 0429) Weight:  [96.7 kg (213 lb 3 oz)] 96.7 kg (213 lb 3 oz) (05/16 0429)     Intake/Output from previous day: 05/15 0701 - 05/16 0700 In: 70 [P.O.:60; I.V.:10] Out: 2230 [Urine:2200; Chest Tube:30] Intake/Output this shift: Total I/O In: 360 [P.O.:360] Out: 300 [Urine:300]  General appearance: alert, cooperative and no distress Heart: regular rate and rhythm, S1, S2 normal, no murmur, click, rub or gallop Lungs: clear to auscultation bilaterally Abdomen: soft, non-tender; bowel sounds normal; no masses,  no organomegaly Extremities: 1-2+ pitting edema in bilateral lower extremities. Wound: clean and dry without drainage  Lab Results:  Recent Labs  02/21/17 0455 02/22/17 0449  WBC 4.5 5.7  HGB 8.6* 8.4*  HCT 26.1* 26.6*  PLT 194 244   BMET:  Recent Labs  02/21/17 0455 02/22/17 0449 02/23/17 0436  NA 131* 130*  129*  --   K 3.2* 3.6  3.6  --   CL 95* 96*  96*  --   CO2 '26 25  24  '$ --   GLUCOSE 92 91  91  --   BUN 36* 33*  33*  --   CREATININE 1.67* 1.54*  1.54* 1.47*  CALCIUM 7.9* 8.2*  8.2*  --     PT/INR: No results for input(s): LABPROT, INR in the last 72 hours. ABG    Component Value Date/Time   PHART 7.333 (L)  02/15/2017 1853   HCO3 19.4 (L) 02/15/2017 1853   TCO2 24 02/16/2017 1610   ACIDBASEDEF 6.0 (H) 02/15/2017 1853   O2SAT 55.5 02/19/2017 0320   CBG (last 3)   Recent Labs  02/22/17 1615 02/22/17 2045 02/23/17 0622  GLUCAP 117* 128* 104*    Assessment/Plan: S/P Procedure(s) (LRB): CORONARY ARTERY BYPASS GRAFTING times three  with left internal mammary harvest and endoscopic harvest of Right SVG. Grafts of LIMA to  LAD, SVG to Distal Circ, and to First Diag. (N/A) TRANSESOPHAGEAL ECHOCARDIOGRAM (TEE) (N/A)  1. CV-NSR in the 80s. BP well controlled. In and out of atrial fibrillation on tele. May need to restart PO Amio. Will leave wires today.  2. Pulm-tolerating room air with good oxygen saturation. Encourage incentive spirometry. Chest tube remains with little output. CXR from yesterday showed a small left pneumothorax. Will order a CXR this morning and evaluate.  3. Renal-creatinine trending down and is now 1.47. Electrolytes okay. Good urine output. Weight continues to trend down. Remains 2kg over baseline.  4. Expected acute blood loss anemia- H and H stable. 5. Endo- blood glucose level well controlled on current regimen.  6. Patient complaints of chronic numbness in now both lower extremities. Has been evaluated in the  past without resolution. Not a diabetic. Good distal pulses. Possible PAD, will refer to [facility] outpatient to evaluate.   Plan: If CXR remains stable this morning will convert to water seal and if remains stable overnight will remove tomorrow. Continues to progress. Ambulate TID. + BM yesterday. Continue to use incentive spirometry. Continue diuretics for fluid overload. Possible home Friday/Saturday.   LOS: 11 days    Elgie Collard 02/23/2017  I have seen and examined the patient and agree with the assessment and plan as outlined.  D/C tube tomorrow if CXR okay  Rexene Alberts, MD 02/23/2017 10:02 PM

## 2017-02-23 NOTE — Discharge Summary (Signed)
Physician Discharge Summary  Patient ID: EMMET MESSER MRN: [id number] DOB/AGE: Feb 12, 1932 83 y.o.  Admit date: 02/11/2017 Discharge date: 02/25/2017  Admission Diagnoses: Patient Active Problem List   Diagnosis Date Noted  . PAF (paroxysmal atrial fibrillation) ([place])   . 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 NSTEMI (non-ST elevated myocardial infarction) ([place]) 02/12/2017  . Chest pain 02/11/2017    Discharge Diagnoses:  Principal Problem:   NSTEMI (non-ST elevated myocardial infarction) ([place]) Active Problems:   HLD (hyperlipidemia)   Anemia, iron deficiency   HTN (hypertension)   VARICOSE VEINS, LOWER EXTREMITIES   GERD   Alcohol use/ h/o heavy abuse   Chest pain   S/P CABG x 3   PAF (paroxysmal atrial fibrillation) ([place])   Discharged Condition: good  HPI:  Mr. Wurm is an 83 yo white male with known history of HTN, HLD, H/O Gastric Ulcers with GI Bleed in 2016,  previous nicotine abuse, and current daily alcohol use.  He presented to the ED on 02/11/2017 with complaints of chest pain.  The patient states the pain was extreme pressure that started at his elbow with extension into shoulders and chest.  There was associated dry heaves with this.  He states this has been occurring for the past several months, but has been getting worse.  Workup in the ED ruled the patient in for a NSTEMI and he was placed on Heparin and admitted for further care.  He underwent cardiac catheterization today which showed preserved EF and multivessel CAD.  It was felt coronary bypass grafting would be indicated and [facility] consult was obtained.  The patient currently is chest pain free.  He admitted continues to consume daily alcohol of 3(14 oz) beers and 2 strong mix drinks per day.  He used to smoke 1.5 ppd for 58 yrs but quit over 9 years ago.  He is accompanied by his wife who helped provide history.  The patient denies any current hemoptysis or problems with his ulcers.  He does admit to be fairly inactive with  intense pain in his lower legs walking 30 yards to the mailbox.  He does have to rest to complete this.  The patients wife thinks he has early signs of dementia/alzhiemers, but the patient denies this.  He wishes to proceed with surgery if indicated.  Hospital Course:   On 02/15/2017 Mr. Owens Shark underwent a coronary bypass grafting 3 with Dr. Servando Snare. He tolerated the procedure well and was transferred to the CVICU. He was extubated in a timely manner. We initiated a diuretic regimen for fluid overload. We discontinued the chest tubes and lines. He remained on dopamine for increased renal perfusion. Was originally in atrial flutter then converted to atrial fibrillation. Amiodarone initiated. A renal ultrasound was ordered for acute kidney injury. We consult nephrology. We continued to wean dopamine as tolerated. We continued his diuretic regimen for fluid overload. He converted back into normal sinus rhythm with frequent PACs. We continued amiodarone at this time. On postop day 5 he continued to progress. We discontinued his Foley catheter. We continue to watch his renal function closely. We continued his chest tube at this time. He continued to have frequent PACs but remained in normal sinus rhythm. On postop day 6 we decreased his Lasix dose. We continue to watch his hemoglobin and added iron and folate. We stopped amiodarone now that he was normal sinus rhythm and restarted a low-dose metoprolol. He remained about 10 pounds over his preoperative weight so we continued his  diuretic regimen.  Chest x-ray showed approximately a 5% minimally larger pneumothorax, therefore his chest tube was capped. He was tolerating room air at this time and we encouraged incentive spirometry. He had been going in and out of atrial fibrillation with a rate of 100, therefore restarted amiodarone on postop day 8.  He is not felt to be a candidate for anticoagulation with previous history of GI bleeding, elevated creatinine, and  heavy daily alcohol use. His wires remained. We continued to order serial chest x-rays to monitor the progress of his 5% pneumothorax.  He is ambulating independently.  He is in NSR and felt medically stable for discharge home today.  Consults: Nephrology, Cardiology  Significant Diagnostic Studies:   CLINICAL DATA:  83 year old male with pneumothorax. Subsequent encounter.  EXAM: PORTABLE CHEST 1 VIEW  COMPARISON:  02/21/2017.  FINDINGS: Left-sided chest tube remains in place with small left apical pneumothorax (approximately 5%), minimally larger. Tiny amount of gas seen along the inferior left heart border has cleared.  Right PICC line tip distal superior vena cava level.  Post CABG.  Cardiomegaly.  Calcified aorta.  Central pulmonary vascular prominence without pulmonary edema or segmental consolidation.  IMPRESSION: Left-sided chest tube remains in place with small left apical pneumothorax (approximately 5%), minimally larger. Tiny amount of gas seen along the inferior left heart border has cleared.   Electronically Signed   By: Genia Del M.D.   On: 02/22/2017 08:39    Treatments:   NAME:  IMRAN, NUON                ACCOUNT NO.:  [id number]  MEDICAL RECORD NO.:  [id number]  LOCATION:  [id number]                        FACILITY:  [facility]  PHYSICIAN:  Lanelle Bal, MD    DATE OF BIRTH:  [birth year]  DATE OF PROCEDURE:  02/15/2017 DATE OF DISCHARGE:                              OPERATIVE REPORT   POSTOPERATIVE DIAGNOSIS:  Coronary occlusive disease with recent non-ST- elevation myocardial infarction with critical left main obstruction.  POSTOPERATIVE DIAGNOSIS:  Coronary occlusive disease with recent non-ST- elevation myocardial infarction with critical left main obstruction.  SURGICAL PROCEDURE:  Coronary artery bypass grafting x3 with left internal mammary to the left anterior descending coronary artery, reverse saphenous vein graft  to the diagonal coronary artery, reverse saphenous vein graft to the distal circumflex coronary artery with right thigh greater saphenous endovein harvesting.  SURGEON:  Lanelle Bal, MD  FIRST ASSISTANT:  Nicholes Rough, [place].  Disposition: 01-Home or Self Care   Discharge Medications:  Discharge Instructions    Amb Referral to Cardiac Rehabilitation    Complete by:  As directed    Diagnosis:   CABG NSTEMI     CABG X ___:  3     Allergies as of 02/25/2017      Reactions   No Known Allergies       Medication List    STOP taking these medications   lisinopril-hydrochlorothiazide 20-25 MG tablet Commonly known as:  PRINZIDE,ZESTORETIC   Na Sulfate-K Sulfate-Mg Sulf 17.5-3.13-1.6 GM/180ML Soln Commonly known as:  SUPREP BOWEL PREP KIT   traMADol 50 MG tablet Commonly known as:  ULTRAM     TAKE these medications   acetaminophen 325 MG tablet Commonly known as:  TYLENOL Take 2 tablets (650 mg total) by mouth every 6 (six) hours as needed for mild pain (or Fever >/= 101). What changed:  how much to take   amiodarone 200 MG tablet Commonly known as:  PACERONE Please take 2 tabs (45m) twice a day for 2 days then take 1 tab (2032m twice a day until follow-up in our office   aspirin 81 MG EC tablet Take 1 tablet (81 mg total) by mouth daily.   furosemide 40 MG tablet Commonly known as:  LASIX Please take 1 tablet twice a day for 5 days then take one tablet one a day until follow-up.   metoprolol tartrate 25 MG tablet Commonly known as:  LOPRESSOR Take 1 tablet (25 mg total) by mouth 2 (two) times daily.   oxyCODONE 5 MG immediate release tablet Commonly known as:  Oxy IR/ROXICODONE Take 1 tablet (5 mg total) by mouth every 4 (four) hours as needed for severe pain.   pantoprazole 40 MG tablet Commonly known as:  PROTONIX Take 1 tablet (40 mg total) by mouth 2 (two) times daily.   Potassium Chloride ER 20 MEQ Tbcr Please take one tab twice a day for 5 days  then take one tab once a day until follow-up   pravastatin 40 MG tablet Commonly known as:  PRAVACHOL Take 1 tablet (40 mg total) by mouth daily.      Follow-up Information    OpMellody DanceDO. Call in 1 day(s).   Specialty:  Family Medicine Contact information: [address] [phone]      GeGrace IsaacMD Follow up.   Specialty:  Cardiothoracic Surgery Why:  Your appointment is on 03/31/2017 is at 12:30pm. Please arrive at 12:00pm [facility].  Contact information: [address] [phone]      nursing. Call in 1 day(s).   Why:  Please call for a suture removal appointment next week.  Contact information: Dr. GeEverrett Coombeffice.          The patient has been discharged on:   1.Beta Blocker:  Yes [ x  ]                              No   [   ]                              If No, reason:  2.Ace Inhibitor/ARB: Yes [   ]                                     No  [  x  ]                                     If No, reason: AKI  3.Statin:   Yes [ x  ]                  No  [   ]                  If No, reason:  4.Ecasa:  Yes  [ x  ]                  No   [   ]                  If No, reason:    Signed: Hilja Kintzel 02/25/2017, 3:33 PM

## 2017-02-24 ENCOUNTER — Inpatient Hospital Stay (HOSPITAL_COMMUNITY): Payer: Medicare HMO

## 2017-02-24 ENCOUNTER — Ambulatory Visit: Payer: PPO | Admitting: Family Medicine

## 2017-02-24 LAB — GLUCOSE, CAPILLARY
GLUCOSE-CAPILLARY: 100 mg/dL — AB (ref 65–99)
Glucose-Capillary: 122 mg/dL — ABNORMAL HIGH (ref 65–99)
Glucose-Capillary: 112 mg/dL — ABNORMAL HIGH (ref 65–99)
Glucose-Capillary: 128 mg/dL — ABNORMAL HIGH (ref 65–99)

## 2017-02-24 NOTE — Progress Notes (Addendum)
[address]       [place] [address]             [phone]      9 Days Post-Op Procedure(s) (LRB): CORONARY ARTERY BYPASS GRAFTING times three  with left internal mammary harvest and endoscopic harvest of Right SVG. Grafts of LIMA to  LAD, SVG to Distal Circ, and to First Diag. (N/A) TRANSESOPHAGEAL ECHOCARDIOGRAM (TEE) (N/A) Subjective: No issues overnight. Eating breakfast peacefully this morning.   Objective: Vital signs in last 24 hours: Temp:  [98.2 F (36.8 C)-98.4 F (36.9 C)] 98.4 F (36.9 C) (05/17 0338) Pulse Rate:  [78-83] 78 (05/17 0338) Cardiac Rhythm: Normal sinus rhythm (05/16 1945) Resp:  [17-18] 18 (05/17 0338) BP: (120-129)/(65-72) 126/72 (05/17 0338) SpO2:  [97 %-98 %] 97 % (05/17 0338) Weight:  [97.4 kg (214 lb 11.7 oz)] 97.4 kg (214 lb 11.7 oz) (05/17 0700)     Intake/Output from previous day: 05/16 0701 - 05/17 0700 In: 600 [P.O.:600] Out: 2125 [Urine:2075; Chest Tube:50] Intake/Output this shift: No intake/output data recorded.  General appearance: alert, cooperative and no distress Heart: regular rate and rhythm, S1, S2 normal, no murmur, click, rub or gallop Lungs: clear to auscultation bilaterally Abdomen: soft, non-tender; bowel sounds normal; no masses,  no organomegaly Extremities: 2+ non-pitting pedal edema Wound: clean and dry  Lab Results:  Recent Labs  02/22/17 0449  WBC 5.7  HGB 8.4*  HCT 26.6*  PLT 244   BMET:  Recent Labs  02/22/17 0449 02/23/17 0436  NA 130*  129*  --   K 3.6  3.6  --   CL 96*  96*  --   CO2 25  24  --   GLUCOSE 91  91  --   BUN 33*  33*  --   CREATININE 1.54*  1.54* 1.47*  CALCIUM 8.2*  8.2*  --     PT/INR: No results for input(s): LABPROT, INR in the last 72 hours. ABG    Component Value Date/Time   PHART 7.333 (L) 02/15/2017 1853   HCO3 19.4 (L) 02/15/2017 1853   TCO2 24 02/16/2017 1610   ACIDBASEDEF 6.0 (H) 02/15/2017 1853   O2SAT 55.5 02/19/2017 0320     CBG (last 3)   Recent Labs  02/23/17 1650 02/23/17 2125 02/24/17 0658  GLUCAP 114* 120* 100*    Assessment/Plan: S/P Procedure(s) (LRB): CORONARY ARTERY BYPASS GRAFTING times three  with left internal mammary harvest and endoscopic harvest of Right SVG. Grafts of LIMA to  LAD, SVG to Distal Circ, and to First Diag. (N/A) TRANSESOPHAGEAL ECHOCARDIOGRAM (TEE) (N/A)  1. CV-NSR in the 47s. BP well controlled. In and out of atrial fibrillation on tele. PO Amio. Will leave wires today.  2. Pulm-tolerating room air with good oxygen saturation. Encourage incentive spirometry. Chest tube remains with little output. CXR from this morning showed a small left pneumothorax with increase from 5-10% pneumo. I think it looks stable but will discuss with attending before discontinuing chest tube.  3. Renal-creatinine trending down. Electrolytes okay. Good urine output. Weight continues to trend down. Remains at least 2kg over baseline.  4. Expected acute blood loss anemia- H and H stable. 5. Endo- blood glucose level well controlled on current regimen.  6. Patient complaints of chronic numbness in now both lower extremities. Has been evaluated in the past without resolution. Not a diabetic. Good distal pulses. Possible PAD, will refer to [facility] outpatient to evaluate.   Plan:  Likely chest tube and pacing wires out today. Continue ambulation, continue diuresis. Recovering well.    LOS: 12 days    Elgie Collard 02/24/2017   I have seen and examined the patient and agree with the assessment and plan as outlined.  CXR looks fine.  D/C tube  Rexene Alberts, MD 02/24/2017 9:03 AM

## 2017-02-24 NOTE — Progress Notes (Signed)
Left side chest tube removed per order. Site clean and dry. Vaseline gauze dressing applied. Sutures intact. Pt tolerated well. Will continue to monitor.   Grant Fontana BSN, RN

## 2017-02-24 NOTE — Progress Notes (Signed)
CARDIAC REHAB PHASE I   PRE:  Rate/Rhythm: 86 SR  BP:  Supine: 121/64  Sitting:   Standing:    SaO2: 95%RA  MODE:  Ambulation: 450 ft   POST:  Rate/Rhythm: 93 SR  BP:  Supine:   Sitting: 144/83  Standing:    SaO2: 98%RA 1044-1140 Third attempt to get pt to walk. Waited until bedrest up from pacing wire removal before getting up. Pt walked 450 ft on RA with rolling walker and asst x 1. Gait fairly steady. Re enforced sternal precautions. To recliner after walk with call bell. Pt stated did not feel as well today as yesterday.    Graylon Good, RN BSN  02/24/2017 11:34 AM

## 2017-02-24 NOTE — Progress Notes (Signed)
Epicardial pacing wires removed per order. Wire ends intact. Vitals stable. Sites clean and dry. Pt tolerated well. Pt on bedrest x1 hour. Vitals being monitored every 15 minutes, per protocol. Pt in bed with call light within reach. Friend at bedside. Will continue to monitor.  Grant Fontana BSN, RN

## 2017-02-25 ENCOUNTER — Inpatient Hospital Stay (HOSPITAL_COMMUNITY): Payer: Medicare HMO

## 2017-02-25 LAB — GLUCOSE, CAPILLARY
Glucose-Capillary: 95 mg/dL (ref 65–99)
Glucose-Capillary: 101 mg/dL — ABNORMAL HIGH (ref 65–99)

## 2017-02-25 MED ORDER — ASPIRIN 81 MG PO TBEC: 81.0000 mg | DELAYED_RELEASE_TABLET | Freq: Every day | ORAL | 1 refills | Status: DC

## 2017-02-25 MED ORDER — METOPROLOL TARTRATE 25 MG PO TABS: 25.0000 mg | ORAL_TABLET | Freq: Two times a day (BID) | ORAL | 1 refills | Status: DC

## 2017-02-25 MED ORDER — FUROSEMIDE 40 MG PO TABS: ORAL_TABLET | ORAL | 1 refills | Status: DC

## 2017-02-25 MED ORDER — AMIODARONE HCL 200 MG PO TABS: ORAL_TABLET | ORAL | 1 refills | Status: DC

## 2017-02-25 MED ORDER — POTASSIUM CHLORIDE ER 20 MEQ PO TBCR: EXTENDED_RELEASE_TABLET | ORAL | 1 refills | Status: DC

## 2017-02-25 MED ORDER — OXYCODONE HCL 5 MG PO TABS: 5.0000 mg | ORAL_TABLET | ORAL | 0 refills | Status: DC | PRN

## 2017-02-25 NOTE — Care Management Important Message (Signed)
Important Message  Patient Details  Name: Vincent Mcclure MRN: [id number] Date of Birth: 1932/03/06   Medicare Important Message Given:  Yes    Nathen May 02/25/2017, 3:12 PM

## 2017-02-25 NOTE — Progress Notes (Signed)
Asked about anticoagulation for PAF. Discussed with Dr Sallyanne Kuster. Pt is a CHA2DS2 VASc=4 but his HSBLED score is 5. He has a history of serious GI bleed in 2016 and heavy ETOH use. He is currently in NSR on Amiodarone. No plans for anticoagulation at this time. We will see in f/u.   Kerin Ransom PA-C 02/25/2017 3:34 PM  Agree that bleeding risk exceeds potential stroke reduction benefit. No anticoagulants unless AFib recurs late after surgery.  Sanda Klein, MD, [facility] [phone] office [phone] pager

## 2017-02-25 NOTE — Progress Notes (Signed)
Physical Therapy Treatment Patient Details Name: Vincent Mcclure MRN: [id number] DOB: May 11, 1932 Today's Date: 02/25/2017    History of Present Illness Pt adm with chest pain and found to have NSTEMI . Pt s/p CABG x 3 on 5/8.  PMH - HTN, GI bleed, PAD, peripheral neuropathy, ETOH abuse, back surgery    PT Comments    Pt pleasant and moving well with decreased need for assistance with transfers and gait. Pt able to state all precautions and maintain with mobility with wife educated during session as well. Pt educated for HEP and encouraged to continue increased ambulation. Will continue to follow.  HR 92, BP 137/75 after gait   Follow Up Recommendations  Home health PT;Supervision for mobility/OOB     Equipment Recommendations  None recommended by PT    Recommendations for Other Services       Precautions / Restrictions Precautions Precautions: Sternal;Fall Restrictions Weight Bearing Restrictions: Yes (sternal precautions)    Mobility  Bed Mobility               General bed mobility comments: in chair on arrival, denied practicing bed mobility  Transfers Overall transfer level: Needs assistance   Transfers: Sit to/from Stand Sit to Stand: Supervision         General transfer comment: cues for hand placement with sitting  Ambulation/Gait Ambulation/Gait assistance: Supervision Ambulation Distance (Feet): 550 Feet Assistive device: Rolling walker (2 wheeled) Gait Pattern/deviations: Step-through pattern;Decreased stride length Gait velocity: 1.33f/sec Gait velocity interpretation: <1.8 ft/sec, indicative of risk for recurrent falls General Gait Details: cues for posture, pt able to self -regulate distance   Stairs            Wheelchair Mobility    Modified Rankin (Stroke Patients Only)       Balance     Sitting balance-Leahy Scale: Good       Standing balance-Leahy Scale: Good                              Cognition  Arousal/Alertness: Awake/alert Behavior During Therapy: WFL for tasks assessed/performed Overall Cognitive Status: Within Functional Limits for tasks assessed                                        Exercises General Exercises - Lower Extremity Long Arc Quad: AROM;Both;Seated;10 reps Hip Flexion/Marching: AROM;Both;10 reps;Seated    General Comments        Pertinent Vitals/Pain Pain Assessment: No/denies pain    Home Living                      Prior Function            PT Goals (current goals can now be found in the care plan section) Progress towards PT goals: Progressing toward goals    Frequency           PT Plan Discharge plan needs to be updated    Co-evaluation              AM-PAC PT "6 Clicks" Daily Activity  Outcome Measure  Difficulty turning over in bed (including adjusting bedclothes, sheets and blankets)?: A Little Difficulty moving from lying on back to sitting on the side of the bed? : A Little Difficulty sitting down on and standing up from a chair with arms (e.g., wheelchair, bedside  commode, etc,.)?: A Little Help needed moving to and from a bed to chair (including a wheelchair)?: None Help needed walking in hospital room?: A Little Help needed climbing 3-5 steps with a railing? : A Little 6 Click Score: 19    End of Session Equipment Utilized During Treatment: Gait belt Activity Tolerance: Patient tolerated treatment well Patient left: in chair;with call bell/phone within reach;with family/visitor present Nurse Communication: Mobility status       Time: 0902-0927 PT Time Calculation (min) (ACUTE ONLY): 25 min  Charges:  $Gait Training: 8-22 mins $Therapeutic Exercise: 8-22 mins                    G Codes:       [name], [place]   [place] 02/25/2017, 10:31 AM

## 2017-02-25 NOTE — Progress Notes (Signed)
Chest tube sutures removed per order. Sites clean and dry. Benzoin and steri-strips applied. PICC line removed by IV team. Telemetry box removed and CCMD notified. Pt and pt's wife received discharge instructions and prescriptions; all questions were answered. Pt left with all of his belongings. Pt left the unit via wheelchair and was accompanied by this RN.   Grant Fontana BSN, RN

## 2017-02-25 NOTE — Progress Notes (Signed)
  PT Cancellation Note  Patient Details Name: Vincent Mcclure MRN: [id number] DOB: 22-Apr-1932   Cancelled Treatment:    Reason Eval/Treat Not Completed: Other (comment) (pt eating at 0730 and request return near 0900. will attempt as time allows)   Bertina Guthridge B Jennine Peddy 02/25/2017, 8:10 AM  [name], [place]

## 2017-02-25 NOTE — Progress Notes (Signed)
Clinical Social Worker met patient and wife at bedside to discuss discharge needs. Patient stated he does not want to go to a SNF but would rather go home. Patient stated he will be fine at home with wife. Patient wife stated that if they need help at home she will reach out to someone and get the assistance she needs for patient. Patient stated at this time they do not want home health PT. CSW signing off at this time as patient no longer has social work needs.   Rhea Pink, MSW,  [place]

## 2017-02-25 NOTE — Progress Notes (Signed)
      [address]       [place] [address]             [phone]      10 Days Post-Op Procedure(s) (LRB): CORONARY ARTERY BYPASS GRAFTING times three  with left internal mammary harvest and endoscopic harvest of Right SVG. Grafts of LIMA to  LAD, SVG to Distal Circ, and to First Diag. (N/A) TRANSESOPHAGEAL ECHOCARDIOGRAM (TEE) (N/A) Subjective: No issues overnight. Wants to go home today.   Objective: Vital signs in last 24 hours: Temp:  [97.8 F (36.6 C)-99.5 F (37.5 C)] 97.8 F (36.6 C) (05/18 0455) Pulse Rate:  [68-83] 68 (05/18 0455) Cardiac Rhythm: Normal sinus rhythm (05/17 2109) Resp:  [18] 18 (05/18 0455) BP: (117-140)/(64-80) 119/64 (05/18 0455) SpO2:  [95 %-100 %] 95 % (05/18 0455) Weight:  [96.9 kg (213 lb 9.6 oz)] 96.9 kg (213 lb 9.6 oz) (05/18 0500)     Intake/Output from previous day: 05/17 0701 - 05/18 0700 In: 2893 [P.O.:2883; I.V.:10] Out: 1950 [Urine:1950] Intake/Output this shift: No intake/output data recorded.  General appearance: alert, cooperative and no distress Heart: regular rate and rhythm, S1, S2 normal, no murmur, click, rub or gallop Lungs: clear to auscultation bilaterally Abdomen: soft, non-tender; bowel sounds normal; no masses,  no organomegaly Extremities: 1+ pitting edema bilaterally Wound: clean and dry  Lab Results: No results for input(s): WBC, HGB, HCT, PLT in the last 72 hours. BMET:  Recent Labs  02/23/17 0436  CREATININE 1.47*    PT/INR: No results for input(s): LABPROT, INR in the last 72 hours. ABG    Component Value Date/Time   PHART 7.333 (L) 02/15/2017 1853   HCO3 19.4 (L) 02/15/2017 1853   TCO2 24 02/16/2017 1610   ACIDBASEDEF 6.0 (H) 02/15/2017 1853   O2SAT 55.5 02/19/2017 0320   CBG (last 3)   Recent Labs  02/24/17 1625 02/24/17 2051 02/25/17 0553  GLUCAP 112* 128* 95    Assessment/Plan: S/P Procedure(s) (LRB): CORONARY ARTERY BYPASS GRAFTING times three  with left internal  mammary harvest and endoscopic harvest of Right SVG. Grafts of LIMA to  LAD, SVG to Distal Circ, and to First Diag. (N/A) TRANSESOPHAGEAL ECHOCARDIOGRAM (TEE) (N/A)  1. CV-NSR in the 60s. BP well controlled. In and out of atrial fibrillation on tele. PO Amio and BB. Wires out.  2. Pulm-tolerating room air with good oxygen saturation. Encourage incentive spirometry.  CXR ordered this morning.  3. Renal-creatinine trending down. Electrolytes okay. Good urine output. Weight continues to trend down. Remains at least 2kg over baseline.  4. Expected acute blood loss anemia- H and H stable. 5. Endo- blood glucose level well controlled on current regimen.   Plan: CXR pending from this morning. Likely home today if CXR okay.  May need anticoagulation. Miralax for constipation.    LOS: 13 days    Elgie Collard 02/25/2017

## 2017-02-25 NOTE — Progress Notes (Signed)
1015-1100 Pt walked with PT earlier so did not walk. Education completed with pt and wife who voiced understanding. Reviewed sternal precautions, IS use, ex ed and heart healthy food choices. Discussed CRP 2 and will refer to [place]. Pt denied need for home equipment. Graylon Good RN BSN 02/25/2017 10:59 AM

## 2017-02-28 ENCOUNTER — Telehealth (HOSPITAL_COMMUNITY): Payer: Self-pay

## 2017-02-28 NOTE — Telephone Encounter (Signed)
Verified [employer] Medicare insurance benefits through [employer]. No Coinsurance or Deductible. Copay $10.00, Out of Pocket $5900.00 pt has met none of his OOP Reference [phone].... KJ

## 2017-03-10 ENCOUNTER — Other Ambulatory Visit: Payer: Self-pay

## 2017-03-10 DIAGNOSIS — G8918 Other acute postprocedural pain: Secondary | ICD-10-CM

## 2017-03-10 MED ORDER — HYDROCODONE-ACETAMINOPHEN 5-325 MG PO TABS: 1.0000 | ORAL_TABLET | Freq: Four times a day (QID) | ORAL | 0 refills | Status: DC | PRN

## 2017-03-10 NOTE — Telephone Encounter (Signed)
RX for Hydrocodone 5/325 mg I tab every 6 hours prn #30 no refill printed out Dr Servando Snare signed. Patient's wife will pick up at front desk

## 2017-03-11 NOTE — Addendum Note (Signed)
Addendum  created 03/11/17 1444 by Rica Koyanagi, MD   Sign clinical note

## 2017-03-14 ENCOUNTER — Encounter: Payer: Self-pay | Admitting: Physician Assistant

## 2017-03-14 ENCOUNTER — Ambulatory Visit (INDEPENDENT_AMBULATORY_CARE_PROVIDER_SITE_OTHER): Payer: Medicare HMO | Admitting: Physician Assistant

## 2017-03-14 VITALS — BP 118/72 | HR 73 | Ht 71.0 in | Wt 211.0 lb

## 2017-03-14 DIAGNOSIS — Z789 Other specified health status: Secondary | ICD-10-CM

## 2017-03-14 DIAGNOSIS — Z7289 Other problems related to lifestyle: Secondary | ICD-10-CM

## 2017-03-14 DIAGNOSIS — I25810 Atherosclerosis of coronary artery bypass graft(s) without angina pectoris: Secondary | ICD-10-CM | POA: Diagnosis not present

## 2017-03-14 DIAGNOSIS — I480 Paroxysmal atrial fibrillation: Secondary | ICD-10-CM

## 2017-03-14 DIAGNOSIS — I25119 Atherosclerotic heart disease of native coronary artery with unspecified angina pectoris: Secondary | ICD-10-CM | POA: Insufficient documentation

## 2017-03-14 DIAGNOSIS — I2510 Atherosclerotic heart disease of native coronary artery without angina pectoris: Secondary | ICD-10-CM | POA: Insufficient documentation

## 2017-03-14 DIAGNOSIS — Z951 Presence of aortocoronary bypass graft: Secondary | ICD-10-CM | POA: Diagnosis not present

## 2017-03-14 DIAGNOSIS — E782 Mixed hyperlipidemia: Secondary | ICD-10-CM | POA: Diagnosis not present

## 2017-03-14 DIAGNOSIS — I10 Essential (primary) hypertension: Secondary | ICD-10-CM

## 2017-03-14 LAB — BASIC METABOLIC PANEL
BUN / CREAT RATIO: 14 (ref 10–24)
BUN: 18 mg/dL (ref 8–27)
CHLORIDE: 95 mmol/L — AB (ref 96–106)
CO2: 22 mmol/L (ref 18–29)
CREATININE: 1.33 mg/dL — AB (ref 0.76–1.27)
Calcium: 9.3 mg/dL (ref 8.6–10.2)
GFR calc Af Amer: 57 mL/min/{1.73_m2} — ABNORMAL LOW (ref >59)
GFR calc non Af Amer: 49 mL/min/{1.73_m2} — ABNORMAL LOW (ref >59)
GLUCOSE: 91 mg/dL (ref 65–99)
POTASSIUM: 4 mmol/L (ref 3.5–5.2)
SODIUM: 134 mmol/L (ref 134–144)

## 2017-03-14 MED ORDER — AMIODARONE HCL 200 MG PO TABS: ORAL_TABLET | ORAL | 9 refills | Status: DC

## 2017-03-14 MED ORDER — METOPROLOL SUCCINATE ER 50 MG PO TB24: 50.0000 mg | ORAL_TABLET | Freq: Every day | ORAL | 3 refills | Status: DC

## 2017-03-14 NOTE — Progress Notes (Signed)
Cardiology Office Note    Date:  03/14/2017   ID:  Vincent Mcclure, DOB 1931/11/13, MRN [id number]  PCP:  [name], DO  Cardiologist: Dr. Meda Coffee  Chief Complaint  Patient presents with  . Follow-up    post CABG  . Edema    legs and feet    History of Present Illness:  Vincent Mcclure is a 83 y.o. male with history of hypertension, HLD, gastric ulcers with big GI bleed in 2016 secondary to alcohol use and previous tobacco abuse. He was admitted with NSTEMI and was found to have multivessel CAD and preserved LV function at cardiac cath. He underwent CABG 35/8/18. He had postop atrial fibrillation treated with amiodarone and converted to normal sinus rhythm.CHADSVASC=4 but HSBED score is 5 so no anticoagulation at this time per Dr. Sallyanne Kuster. Bleeding risk exceeds potential stroke reduction benefit. He also had a 5% pneumothorax and acute kidney injury followed by nephrology.  Patient comes in today accompanied by his wife. Overall he says he is doing well. He has mild soreness from surgery. His legs and ankles were swollen a lot last week but they have gone down some. He's decreased his alcohol intake and is only had a few beers since he's been home. He had very salty pizza last night. He denies any palpitations or fast heart rates. He ran out of metoprolol yesterday. He also ran out of Lasix yesterday but took potassium today. His breathing has been fine. He says he won't do cardiac rehabilitation.   Past Medical History:  Diagnosis Date  . Alcohol abuse   . Anemia   . Chronic low back pain 10/14/2011  . Colon polyps    hyperplastic (2004, 2010) and adenomatous (1990).    . COLONIC POLYPS, HX OF 03/05/2008  . Esophageal stricture    hx of  . Gastric ulcer   . GERD 03/05/2008  . GERD (gastroesophageal reflux disease) 1994   associated peptic strictures  . HYPERLIPIDEMIA 03/05/2008  . HYPERTENSION 03/05/2008  . Hypertension   . IBS 03/05/2008  . Impaired glucose tolerance  10/12/2011  . Iron deficiency anemia   . LEG CRAMPS 09/02/2010  . PAD (peripheral artery disease) ([place]) 10/14/2011  . PAD (peripheral artery disease) ([place]) 2013  . PERIPHERAL EDEMA 09/02/2010  . PERIPHERAL NEUROPATHY 03/05/2008  . Prostatitis    hx of  . VARICOSE VEINS, LOWER EXTREMITIES 06/09/2009    Past Surgical History:  Procedure Laterality Date  . CATARACT EXTRACTION  bilat  . CORONARY ARTERY BYPASS GRAFT N/A 02/15/2017   Procedure: CORONARY ARTERY BYPASS GRAFTING times three  with left internal mammary harvest and endoscopic harvest of Right SVG. Grafts of LIMA to  LAD, SVG to Distal Circ, and to First Diag.;  Surgeon: Grace Isaac, MD;  Location: [facility];  Service: Open Heart Surgery;  Laterality: N/A;  . ESOPHAGOGASTRODUODENOSCOPY N/A 11/14/2014   Procedure: ESOPHAGOGASTRODUODENOSCOPY (EGD);  Surgeon: Jerene Bears, MD;  Location[facility] ENDOSCOPY;  Service: Endoscopy;  Laterality: N/A;  . LEFT HEART CATH AND CORONARY ANGIOGRAPHY N/A 02/14/2017   Procedure: Left Heart Cath and Coronary Angiography;  Surgeon: Belva Crome, MD;  Location: [facility] CV LAB;  Service: Cardiovascular;  Laterality: N/A;  . LUMBAR SPINE SURGERY  11/2008   Dr Joya Salm  . TEE WITHOUT CARDIOVERSION N/A 02/15/2017   Procedure: TRANSESOPHAGEAL ECHOCARDIOGRAM (TEE);  Surgeon: Grace Isaac, MD;  Location: [facility];  Service: Open Heart Surgery;  Laterality: N/A;    Current Medications: Outpatient  Medications Prior to Visit  Medication Sig Dispense Refill  . acetaminophen (TYLENOL) 325 MG tablet Take 2 tablets (650 mg total) by mouth every 6 (six) hours as needed for mild pain (or Fever >/= 101). (Patient taking differently: Take 325-650 mg by mouth every 6 (six) hours as needed for mild pain (or Fever >/= 101). )    . aspirin EC 81 MG EC tablet Take 1 tablet (81 mg total) by mouth daily. 30 tablet 1  . HYDROcodone-acetaminophen (NORCO/VICODIN) 5-325 MG tablet Take 1 tablet by mouth every 6 (six) hours as needed for  moderate pain or severe pain. 30 tablet 0  . metoprolol tartrate (LOPRESSOR) 25 MG tablet Take 1 tablet (25 mg total) by mouth 2 (two) times daily. 30 tablet 1  . pantoprazole (PROTONIX) 40 MG tablet Take 1 tablet (40 mg total) by mouth 2 (two) times daily. 60 tablet 2  . pravastatin (PRAVACHOL) 40 MG tablet Take 1 tablet (40 mg total) by mouth daily. 90 tablet 3  . amiodarone (PACERONE) 200 MG tablet Please take 2 tabs (400mg ) twice a day for 2 days then take 1 tab (200mg ) twice a day until follow-up in our office 68 tablet 1  . furosemide (LASIX) 40 MG tablet Please take 1 tablet twice a day for 5 days then take one tablet one a day until follow-up. 40 tablet 1  . potassium chloride 20 MEQ TBCR Please take one tab twice a day for 5 days then take one tab once a day until follow-up 40 tablet 1   No facility-administered medications prior to visit.      Allergies:   No known allergies   Social History   Social History  . Marital status: Married    Spouse name: N/A  . Number of children: 4  . Years of education: N/A   Occupational History  . Retired Engineer, maintenance    Social History Main Topics  . Smoking status: Former Smoker    Packs/day: 1.25    Years: 50.00    Types: Cigarettes    Quit date: 11/11/2008  . Smokeless tobacco: Former Systems developer    Types: Chew  . Alcohol use 3.6 oz/week    2 Glasses of wine, 4 Cans of beer per week  . Drug use: No  . Sexual activity: No   Other Topics Concern  . None   Social History Narrative   ** Merged History Encounter **       4 sons     Family History:  The patient's family history includes Cancer in his mother; Diabetes in his father and sister; Heart disease in his father; Hypertension in his father; Stroke in his father.   ROS:   Please see the history of present illness.    Review of Systems  Constitution: Negative.  HENT: Positive for hearing loss.   Cardiovascular: Positive for leg swelling.  Respiratory: Negative.     Endocrine: Negative.   Hematologic/Lymphatic: Negative.   Musculoskeletal: Positive for myalgias.  Gastrointestinal: Positive for constipation.  Genitourinary: Negative.   Neurological: Negative.    All other systems reviewed and are negative.   PHYSICAL EXAM:   VS:  BP 118/72   Pulse 73   Ht 5\' 11"  (1.803 m)   Wt 211 lb (95.7 kg)   BMI 29.43 kg/m   Physical Exam  GEN: Well nourished, well developed, in no acute distress  Neck: no JVD, carotid bruits, or masses Cardiac: Incisions healing well RRR; no murmurs, rubs, or  gallops  Respiratory:  clear to auscultation bilaterally, normal work of breathing GI: soft, nontender, nondistended, + BS Ext: Trace of ankle edema without cyanosis, clubbing,  Good distal pulses bilaterally Neuro:  Alert and Oriented x 3 Psych: euthymic mood, full affect  Wt Readings from Last 3 Encounters:  03/14/17 211 lb (95.7 kg)  02/25/17 213 lb 9.6 oz (96.9 kg)  09/29/16 210 lb 6.4 oz (95.4 kg)      Studies/Labs Reviewed:   EKG:  EKG is  ordered today.  The ekg ordered today demonstrates  Normal sinus rhythm, normal EKG  Recent Labs: 08/11/2016: TSH 1.10 02/16/2017: Magnesium 2.3 02/20/2017: ALT 13 02/22/2017: BUN 33; BUN 33; Hemoglobin 8.4; Platelets 244; Potassium 3.6; Potassium 3.6; Sodium 130; Sodium 129 02/23/2017: Creatinine, Ser 1.47   Lipid Panel    Component Value Date/Time   CHOL 145 02/12/2017 0657   TRIG 83 02/12/2017 0657   HDL 43 02/12/2017 0657   CHOLHDL 3.4 02/12/2017 0657   VLDL 17 02/12/2017 0657   LDLCALC 85 02/12/2017 0657   LDLDIRECT 133.2 10/14/2011 1106    Additional studies/ records that were reviewed today include:  2-D echo 02/14/17 Study Conclusions   - Left ventricle: The cavity size was mildly dilated. Systolic   function was normal. The estimated ejection fraction was in the   range of 55% to 60%. Wall motion was normal; there were no   regional wall motion abnormalities. Left ventricular diastolic    function parameters were normal. - Aortic valve: Poorly visualized. Trileaflet; mildly thickened,   mildly calcified leaflets SURGICAL PROCEDURE:  Coronary artery bypass grafting x3 with left internal mammary to the left anterior descending coronary artery, reverse saphenous vein graft to the diagonal coronary artery, reverse saphenous vein graft to the distal circumflex coronary artery with right thigh greater saphenous endovein harvesting   Conclusion   Heavily calcified left main and proximal to mid LAD.  Eccentric 75% distal left main  Mid LAD [place] 0, 1, 1 bifurcation stenosis with 90% LAD and 75% second diagonal.  Subtotal occlusion, 99%, of the moderate sized first diagonal, possibly the source of the patient's presentation.  Ostial 80% circumflex. The distal circumflex is relatively small with 2 small to intermediate size branches distally.  Mid to distal 70% PDA. The right coronary is dominant and otherwise widely patent.  Mild hypokinesis mid anterior wall, likely due to occlusion of the first diagonal. Overall LV function is normal with EF 55%. Normal left ventricular filling pressures.   Recommendations:    Surgical consultation via TCTS for consideration of bypass surgery due to left main coronary disease and complex mid LAD stenoses     ASSESSMENT:    1. S/P CABG x 3   2. Coronary artery disease involving coronary bypass graft of native heart without angina pectoris   3. PAF (paroxysmal atrial fibrillation) ([place])   4. Essential hypertension   5. Mixed hyperlipidemia   6. Alcohol use/ h/o heavy abuse      PLAN:  In order of problems listed above:  CAD status post NSTEMI and CABG x 3 02/15/17. LV function preserved. Patient progressing nicely. Refuses cardiac rehabilitation. Continue aspirin, change Toprol to XL 50 mg once daily. Lasix and potassium.  PAF maintaining normal sinus rhythm with amiodarone. Decrease amiodarone to 200 mg once daily. Bleeding  risk higher than stroke risk so no anticoagulation other than aspirin recommended. Should be able to stop amiodarone in the next month or 2. Follow-up with Dr. Meda Coffee in 2  months.  Hypertension well controlled  Hyperlipidemia continue Pravachol  Alcohol use patient has cut back surgery and only had a few beers total.    Medication Adjustments/Labs and Tests Ordered: Current medicines are reviewed at length with the patient today.  Concerns regarding medicines are outlined above.  Medication changes, Labs and Tests ordered today are listed in the Patient Instructions below. There are no Patient Instructions on file for this visit.   Sumner Boast, PA-C  03/14/2017 10:27 AM    [facility] [place], [place], [place]  [address] Phone: [phone]; Fax: [phone]

## 2017-03-14 NOTE — Patient Instructions (Addendum)
Medication Instructions:  Your physician has recommended you make the following change in your medication:  1. Decrease Amiodarone (200 mg ) daily 2. Increase Toprol ER (50 mg ) daily 3. Discontinue Lasix 4. Discontinue Potassium   Labwork: Your physician recommends that you have lab work today: bmet   Testing/Procedures: -None  Follow-Up: Your physician recommends that you keep your scheduled  follow-up appointment with Estella Husk, PA  Your physician wants you to follow-up in: 4 months with Dr. Meda Coffee.  You will receive a reminder letter in the mail two months in advance. If you don't receive a letter, please call our office to schedule the follow-up appointment.    Any Other Special Instructions Will Be Listed Below (If Applicable).     If you need a refill on your cardiac medications before your next appointment, please call your pharmacy.

## 2017-03-30 ENCOUNTER — Other Ambulatory Visit: Payer: Self-pay | Admitting: *Deleted

## 2017-03-30 DIAGNOSIS — Z951 Presence of aortocoronary bypass graft: Secondary | ICD-10-CM

## 2017-03-31 ENCOUNTER — Ambulatory Visit: Payer: Self-pay | Admitting: Cardiothoracic Surgery

## 2017-04-06 ENCOUNTER — Encounter: Payer: Self-pay | Admitting: Family Medicine

## 2017-04-06 ENCOUNTER — Ambulatory Visit (INDEPENDENT_AMBULATORY_CARE_PROVIDER_SITE_OTHER): Payer: Medicare HMO | Admitting: Family Medicine

## 2017-04-06 VITALS — BP 107/70 | HR 87 | Ht 71.0 in | Wt 215.0 lb

## 2017-04-06 DIAGNOSIS — K253 Acute gastric ulcer without hemorrhage or perforation: Secondary | ICD-10-CM | POA: Diagnosis not present

## 2017-04-06 DIAGNOSIS — K921 Melena: Secondary | ICD-10-CM | POA: Diagnosis not present

## 2017-04-06 DIAGNOSIS — K269 Duodenal ulcer, unspecified as acute or chronic, without hemorrhage or perforation: Secondary | ICD-10-CM | POA: Diagnosis not present

## 2017-04-06 DIAGNOSIS — I10 Essential (primary) hypertension: Secondary | ICD-10-CM | POA: Diagnosis not present

## 2017-04-06 DIAGNOSIS — R7302 Impaired glucose tolerance (oral): Secondary | ICD-10-CM

## 2017-04-06 DIAGNOSIS — Z951 Presence of aortocoronary bypass graft: Secondary | ICD-10-CM | POA: Diagnosis not present

## 2017-04-06 DIAGNOSIS — E782 Mixed hyperlipidemia: Secondary | ICD-10-CM | POA: Diagnosis not present

## 2017-04-06 DIAGNOSIS — F1090 Alcohol use, unspecified, uncomplicated: Secondary | ICD-10-CM

## 2017-04-06 DIAGNOSIS — E663 Overweight: Secondary | ICD-10-CM

## 2017-04-06 DIAGNOSIS — Z789 Other specified health status: Secondary | ICD-10-CM

## 2017-04-06 DIAGNOSIS — D508 Other iron deficiency anemias: Secondary | ICD-10-CM

## 2017-04-06 DIAGNOSIS — Z7289 Other problems related to lifestyle: Secondary | ICD-10-CM

## 2017-04-06 MED ORDER — FERROUS SULFATE 325 (65 FE) MG PO TBEC: 325.0000 mg | DELAYED_RELEASE_TABLET | Freq: Three times a day (TID) | ORAL | 11 refills | Status: DC

## 2017-04-06 NOTE — Assessment & Plan Note (Signed)
Encourage no more than 2 drinks daily.  Risks of bleeding/ worsening anemia, stressors to his heart discussed with patient

## 2017-04-06 NOTE — Progress Notes (Signed)
Impression and Recommendations:    1. Glucose intolerance (impaired glucose tolerance)   2. Mixed hyperlipidemia   3. Essential hypertension   4. Other iron deficiency anemia   5. h/o gastric ulcer without hemorrhage or perforation   6. Alcohol use/ h/o heavy abuse   7. S/P CABG x 3   8. Duodenal ulcer disease   9. Overweight (BMI 25.0-29.9)   10. Gastrointestinal hemorrhage with melena      Acute gastric ulcer Patient to make sure he is taking his pantoprazole 40 mg twice a day given to him by GI  Told patient to follow-up with his gastroenterology doctor pyrtle.  Explained to patient that with his anemia, it's important we make sure it isn't coming from an upper GI or lower GI bleed and follow-up with them is imperative.  Last office visit in November 2017- he was told to have a follow-up EGD and colonoscopy and never did.    Patient will call their office to schedule this appointment with them    Anemia, iron deficiency We'll check CBC today and iron deficiency labs.  Told patient to go back on his iron supplements daily- this was added to his med list today  - Encouraged MiraLAX daily if constipation becomes issue.  Alcohol use/ h/o heavy abuse Encourage no more than 2 drinks daily.  Risks of bleeding/ worsening anemia, stressors to his heart discussed with patient    Glucose intolerance (impaired glucose tolerance) Check A1c today- to be drawn with the rest of iron deficiency labs  Encouraged weight loss and regular moderate intensity aerobic activity to goal of 30 minutes daily  Reminded patient to follow low carb diet and stay away from beer, snacks and sweets  HLD (hyperlipidemia)   - Reminded patient of lifestyle modifications to include low saturated and Transfats diet;  - Cut back on alcohol  Treatment per cardiology  HTN (hypertension) Blood pressure low normal range today and patient is completely asymptomatic.    Encouraged low-salt  diet, drink more water daily  Follow up cardiology 7\2\18 as scheduled and also in August as scheduled  Treatment\ continue medicines per cardiology  Overweight (BMI 25.0-29.9) Encouraged weight loss  GI bleed Patient needs to follow up with GI - told him to call for appt  Pt was in the office today for 40+ minutes, with over 50% time spent in face to face counseling of patients various medical conditions, treatment plans of those medical conditions including medicine management and lifestyle modification, strategies to improve health and well being; and in coordination of care. SEE ABOVE FOR DETAILS The patient was counseled, risk factors were discussed, anticipatory guidance given.   New Prescriptions   FERROUS SULFATE 325 (65 FE) MG EC TABLET    Take 1 tablet (325 mg total) by mouth 3 (three) times daily with meals.      Orders Placed This Encounter  Procedures  . CBC with Differential/Platelet  . Vitamin B12  . Folate  . Iron and TIBC  . Ferritin  . Reticulocytes  . Hemoglobin A1c     Gross side effects, risk and benefits, and alternatives of medications and treatment plan in general discussed with patient.  Patient is aware that all medications have potential side effects and we are unable to predict every side effect or drug-drug interaction that may occur.   Patient will call with any questions prior to using medication if they have concerns.  Expresses verbal understanding and consents to current  therapy and treatment regimen.  No barriers to understanding were identified.  Red flag symptoms and signs discussed in detail.  Patient expressed understanding regarding what to do in case of emergency\urgent symptoms  Please see AVS handed out to patient at the end of our visit for further patient instructions/ counseling done pertaining to today's office visit.   Return for Follow-up 3 months- A1c, wt, anemia.     Note: This document was prepared using Dragon voice  recognition software and may include unintentional dictation errors.  [name] 4:18 PM --------------------------------------------------------------------------------------------------------------------------------------------------------------------------------------------------------------------------------------------    Subjective:    CC:  Chief Complaint  Patient presents with  . Hyperlipidemia  . Hypertension    HPI: Vincent Mcclure is a 82 y.o. male with a significant past medical history for tobacco abuse, alcohol abuse, prediabetes, coronary artery disease status post non-STEMI with CABG 3, PUD, PAD, iron deficiency anemia who presents to [place] at [facility] today for issues as discussed below.  This is my second office visit with patient today.  Since last being seen for the first time in 2017, he has undergone CABG  3.    Cardiac--> CAD status post NSTEMI and CABG x 3 on 02/15/17. LV function was preserved.  He had post-op complication of atrial fibrillation treated with amiodarone and converted to normal sinus rhythm.  He has no complaints today Sx by Dr Servando Snare- has f/up OV with him soon.  DOing well with ADL's, and all activities around house and yard.  He has declined cardiac rehabilitation in the past and today declines it again.  Cards is closely monitoring pt and treating patient's hypertension and hyperlipidemia.  Still drinking beer daily- "a couple a day", occ more if he is at the [place] with his buddies- which is 2-3 d/ wk.   Pre-DM:  Elevated A1c-->  08/11/16  Was 6.0.   Wife is a Diabetic and pt checks his sugars occ.  They are usually under great control and less than 100.  No sx  Iron deficiency anemia:    Hemoglobin on 5\15\18 was 8.4 and hematocrit 26.6.    Patient had a blood transfusion 5\19\18.   He has not had a subsequent CBC since.    - Patient went off his iron tablets when he was discharged from the hospital in mid May.   Per pt he wasn't told he was should take them so he just stopped.   -  patient has absolutely no symptoms today.  He denies any lightheadedness, dizziness, fatigue, or any other symptoms assoc w anemia  - Patient was referred to gastroenterology 09/06/2016 for evaluation of his fe def anemia and melena for one month prior.  She was seen by Ellouise Newer PA-C, and it was recommended that he schedule follow-up EGD and colonoscopy at that time.  He was also told to continue the 325 of iron twice a day and was also started on pantoprazole 40 mg twice a day by them.   Patient was lost to follow-up and has not seen them since.    OV with Cards 03/14/17:  Vincent Mcclure is a 83 y.o. male with history of hypertension, HLD, gastric ulcers with big GI bleed in 2016 secondary to alcohol use and previous tobacco abuse. He was admitted with NSTEMI and was found to have multivessel CAD and preserved LV function at cardiac cath. He underwent CABG 35/8/18. He had postop atrial fibrillation treated with amiodarone and converted to normal sinus rhythm.CHADSVASC=4  but HSBED score is 5 so no anticoagulation at this time per Dr. Sallyanne Kuster. Bleeding risk exceeds potential stroke reduction benefit. He also had a 5% pneumothorax and acute kidney injury followed by nephrology.   Plan:   CAD status post NSTEMI and CABG x 3 02/15/17. LV function preserved. Patient progressing nicely. Refuses cardiac rehabilitation. Continue aspirin, change Toprol to XL 50 mg once daily. Lasix and potassium.  PAF maintaining normal sinus rhythm with amiodarone. Decrease amiodarone to 200 mg once daily. Bleeding risk higher than stroke risk so no anticoagulation other than aspirin recommended. Should be able to stop amiodarone in the next month or 2. Follow-up with Dr. Meda Coffee in 2 months.  Hypertension well controlled  Hyperlipidemia continue Pravachol  Alcohol use patient has cut back surgery and only had a few beers total.   Problem   Glucose Intolerance (Impaired Glucose Tolerance)  Hld (Hyperlipidemia)   Qualifier: Diagnosis of  By: Elveria Royals    Htn (Hypertension)   Qualifier: Diagnosis of  By: Elveria Royals    Overweight (Bmi 25.0-29.9)  GI Bleed  Acute Gastric Ulcer  Duodenal Ulcer Disease   history of duodenal ulcer at time of last EGD 2016   Anemia, Iron Deficiency   Qualifier: Diagnosis of  By: Jenny Reichmann MD, Hunt Oris    Alcohol use/ h/o heavy abuse     Wt Readings from Last 3 Encounters:  04/06/17 215 lb (97.5 kg)  03/14/17 211 lb (95.7 kg)  02/25/17 213 lb 9.6 oz (96.9 kg)   BP Readings from Last 3 Encounters:  04/06/17 107/70  03/14/17 118/72  02/25/17 113/62   Pulse Readings from Last 3 Encounters:  04/06/17 87  03/14/17 73  02/25/17 70   BMI Readings from Last 3 Encounters:  04/06/17 29.99 kg/m  03/14/17 29.43 kg/m  02/25/17 29.79 kg/m     Patient Care Team    Relationship Specialty Notifications Start End  [name], DO PCP - General Family Medicine  09/29/16   Pyrtle, Lajuan Lines, MD Consulting Physician Gastroenterology  09/29/16   Allyn Kenner, MD Consulting Physician Dermatology  09/29/16   Dorothy Spark, MD Consulting Physician Cardiology  04/06/17      Patient Active Problem List   Diagnosis Date Noted  . Glucose intolerance (impaired glucose tolerance) 10/12/2011    Priority: High  . HLD (hyperlipidemia) 03/05/2008    Priority: High  . HTN (hypertension) 03/05/2008    Priority: High  . Overweight (BMI 25.0-29.9) 09/29/2016    Priority: Medium  . GI bleed 11/14/2014    Priority: Medium  . Acute esophagitis     Priority: Medium  . Acute gastric ulcer     Priority: Medium  . Duodenal ulcer disease     Priority: Medium  . Anemia, iron deficiency 03/05/2008    Priority: Medium  . GERD 03/05/2008    Priority: Medium  . IBS 03/05/2008    Priority: Medium  . COLONIC POLYPS, HX OF 03/05/2008    Priority: Medium  . Alcohol use/  h/o heavy abuse 08/11/2016    Priority: Low  . Blood in stool 03/05/2008    Priority: Low  . CAD (coronary artery disease) of artery bypass graft 03/14/2017  . PAF (paroxysmal atrial fibrillation) ([place])   . 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 NSTEMI (non-ST elevated myocardial infarction) ([place]) 02/12/2017  . Chest pain 02/11/2017  . Left shoulder pain 01/24/2015  . h/o Anemia due to blood loss, acute 11/14/2014  . Eye pain   .  Upper GI bleed 11/13/2014  . Lumbar disc disease 01/30/2014  . Hearing loss 03/23/2013  . Screening for other and unspecified cardiovascular conditions 03/23/2013  . PAD (peripheral artery disease) ([place]) 10/14/2011  . Chronic low back pain 10/14/2011  . Preventative health care 10/12/2011  . LEG CRAMPS 09/02/2010  . PERIPHERAL EDEMA 09/02/2010  . VARICOSE VEINS, LOWER EXTREMITIES 06/09/2009  . Hereditary and idiopathic peripheral neuropathy 03/05/2008    Past Medical history, Surgical history, Family history, Social history, Allergies and Medications have been entered into the medical record, reviewed and changed as needed.    Current Meds  Medication Sig  . amiodarone (PACERONE) 200 MG tablet Take (200 mg ) daily  . aspirin EC 81 MG EC tablet Take 1 tablet (81 mg total) by mouth daily.  . metoprolol tartrate (LOPRESSOR) 25 MG tablet Take 1 tablet (25 mg total) by mouth 2 (two) times daily.  . pantoprazole (PROTONIX) 40 MG tablet Take 40 mg by mouth 2 (two) times daily.  . pravastatin (PRAVACHOL) 40 MG tablet Take 1 tablet (40 mg total) by mouth daily.  . [DISCONTINUED] pantoprazole (PROTONIX) 40 MG tablet Take 1 tablet (40 mg total) by mouth 2 (two) times daily.    Allergies:  Allergies  Allergen Reactions  . No Known Allergies      Review of Systems: General:   Denies fever, chills, unexplained weight loss.  Optho/Auditory:   Denies visual changes, blurred vision/LOV Respiratory:   Denies wheeze, DOE more than baseline levels.  Cardiovascular:    Denies chest pain, palpitations, new onset peripheral edema  Gastrointestinal:   Denies nausea, vomiting, diarrhea, abd pain.  Genitourinary: Denies dysuria, freq/ urgency, flank pain or discharge from genitals.  Endocrine:     Denies hot or cold intolerance, polyuria, polydipsia. Musculoskeletal:   Denies unexplained myalgias, joint swelling, unexplained arthralgias, gait problems.  Skin:  Denies new onset rash, suspicious lesions Neurological:     Denies dizziness, unexplained weakness, numbness  Psychiatric/Behavioral:   Denies mood changes, suicidal or homicidal ideations, hallucinations    Objective:   Blood pressure 107/70, pulse 87, height 5\' 11"  (1.803 m), weight 215 lb (97.5 kg). Body mass index is 29.99 kg/m. General:  Well Developed, well nourished, appropriate for stated age.  Neuro:  Alert and oriented,  extra-ocular muscles intact  HEENT:  Normocephalic, atraumatic, neck supple, no carotid bruits appreciated  Skin:  no gross rash, warm, pink. Cardiac:  RRR, S1 S2 Respiratory:  ECTA B/L and A/P- coarse breath sounds throughout, Not using accessory muscles, speaking in full sentences- unlabored. Vascular:  Ext warm, no cyanosis apprec.; cap RF less 2 sec. Psych:  No HI/SI, judgement and insight good, Euthymic mood. Full Affect.

## 2017-04-06 NOTE — Assessment & Plan Note (Addendum)
Blood pressure low normal range today and patient is completely asymptomatic.    Encouraged low-salt diet, drink more water daily  Follow up cardiology 7\2\18 as scheduled and also in August as scheduled  Treatment\ continue medicines per cardiology

## 2017-04-06 NOTE — Assessment & Plan Note (Addendum)
Patient to make sure he is taking his pantoprazole 40 mg twice a day given to him by GI  Told patient to follow-up with his gastroenterology doctor pyrtle.  Explained to patient that with his anemia, it's important we make sure it isn't coming from an upper GI or lower GI bleed and follow-up with them is imperative.  Last office visit in November 2017- he was told to have a follow-up EGD and colonoscopy and never did.    Patient will call their office to schedule this appointment with them

## 2017-04-06 NOTE — Assessment & Plan Note (Addendum)
We'll check CBC today and iron deficiency labs.  Told patient to go back on his iron supplements daily- this was added to his med list today  - Encouraged MiraLAX daily if constipation becomes issue.

## 2017-04-06 NOTE — Assessment & Plan Note (Addendum)
Check A1c today- to be drawn with the rest of iron deficiency labs  Encouraged weight loss and regular moderate intensity aerobic activity to goal of 30 minutes daily  Reminded patient to follow low carb diet and stay away from beer, snacks and sweets

## 2017-04-06 NOTE — Patient Instructions (Addendum)
Please make sure you are taking pantoprazole daily.  If you are not please call our office back once you get home.   He was lost to follow-up with gastroenterology and last seen by them in November 2017.  He will call Dr. Hilarie Fredrickson office [facility] gastroenterology for follow-up office visit as he was supposed to have an EGD and colonoscopy.   Make sure you take your iron tablets daily.   Iron Deficiency Anemia, Adult Iron-deficiency anemia is when you have a low amount of red blood cells or hemoglobin. This happens because you have too little iron in your body. Hemoglobin carries oxygen to parts of the body. Anemia can cause your body to not get enough oxygen. It may or may not cause symptoms. Follow these instructions at home: Medicines  Take over-the-counter and prescription medicines only as told by your doctor. This includes iron pills (supplements) and vitamins.  If you cannot handle taking iron pills by mouth, ask your doctor about getting iron through: ? A vein (intravenously). ? A shot (injection) into a muscle.  Take iron pills when your stomach is empty. If you cannot handle this, take them with food.  Do not drink milk or take antacids at the same time as your iron pills.  To prevent trouble pooping (constipation), eat fiber or take medicine (stool softener) as told by your doctor. Eating and drinking  Talk with your doctor before changing the foods you eat. He or she may tell you to eat foods that have a lot of iron, such as: ? Liver. ? Lowfat (lean) beef. ? Breads and cereals that have iron added to them (fortified breads and cereals). ? Eggs. ? Dried fruit. ? Dark green, leafy vegetables.  Drink enough fluid to keep your pee (urine) clear or pale yellow.  Eat fresh fruits and vegetables that are high in vitamin C. They help your body to use iron. Foods with a lot of vitamin C include: ? Oranges. ? Peppers. ? Tomatoes. ? Mangoes. General instructions  Return to your  normal activities as told by your doctor. Ask your doctor what activities are safe for you.  Keep yourself clean, and keep things clean around you (your surroundings). Anemia can make you get sick more easily.  Keep all follow-up visits as told by your doctor. This is important. Contact a doctor if:  You feel sick to your stomach (nauseous).  You throw up (vomit).  You feel weak.  You are sweating for no clear reason.  You have trouble pooping, such as: ? Pooping (having a bowel movement) less than 3 times a week. ? Straining to poop. ? Having poop that is hard, dry, or larger than normal. ? Feeling full or bloated. ? Pain in the lower belly. ? Not feeling better after pooping. Get help right away if:  You pass out (faint). If this happens, do not drive yourself to the hospital. Call your local emergency services (911 in the [place]  You have chest pain.  You have shortness of breath that: ? Is very bad. ? Gets worse with physical activity.  You have a fast heartbeat.  You get light-headed when getting up from sitting or lying down. This information is not intended to replace advice given to you by your health care provider. Make sure you discuss any questions you have with your health care provider. Document Released: 10/30/2010 Document Revised: 06/16/2016 Document Reviewed: 06/16/2016 Elsevier Interactive Patient Education  2017 [employer].    Risk factors for  prediabetes and type 2 diabetes  Researchers don't fully understand why some people develop prediabetes and type 2 diabetes and others don't.  It's clear that certain factors increase the risk, however, including:  Weight. The more fatty tissue you have, the more resistant your cells become to insulin.  Inactivity. The less active you are, the greater your risk. Physical activity helps you control your weight, uses up glucose as energy and makes your cells more sensitive to insulin.  Family history. Your risk  increases if a parent or sibling has type 2 diabetes.  Race. Although it's unclear why, people of certain races - including blacks, Hispanics, American Indians and Asian-Americans - are at higher risk.  Age. Your risk increases as you get older. This may be because you tend to exercise less, lose muscle mass and gain weight as you age. But type 2 diabetes is also increasing dramatically among children, adolescents and younger adults.  Gestational diabetes. If you developed gestational diabetes when you were pregnant, your risk of developing prediabetes and type 2 diabetes later increases. If you gave birth to a baby weighing more than 9 pounds (4 kilograms), you're also at risk of type 2 diabetes.  Polycystic ovary syndrome. For women, having polycystic ovary syndrome - a common condition characterized by irregular menstrual periods, excess hair growth and obesity - increases the risk of diabetes.  High blood pressure. Having blood pressure over 140/90 millimeters of mercury (mm Hg) is linked to an increased risk of type 2 diabetes.  Abnormal cholesterol and triglyceride levels. If you have low levels of high-density lipoprotein (HDL), or "good," cholesterol, your risk of type 2 diabetes is higher. Triglycerides are another type of fat carried in the blood. People with high levels of triglycerides have an increased risk of type 2 diabetes. Your doctor can let you know what your cholesterol and triglyceride levels are.  A good guide to good carbs: The glycemic index ---If you have diabetes, or at risk for diabetes, you know all too well that when you eat carbohydrates, your blood sugar goes up. The total amount of carbs you consume at a meal or in a snack mostly determines what your blood sugar will do. But the food itself also plays a role. A serving of white rice has almost the same effect as eating pure table sugar - a quick, high spike in blood sugar. A serving of lentils has a slower, smaller  effect.  ---Picking good sources of carbs can help you control your blood sugar and your weight. Even if you don't have diabetes, eating healthier carbohydrate-rich foods can help ward off a host of chronic conditions, from heart disease to various cancers to, well, diabetes.  ---One way to choose foods is with the glycemic index (GI). This tool measures how much a food boosts blood sugar.  The glycemic index rates the effect of a specific amount of a food on blood sugar compared with the same amount of pure glucose. A food with a glycemic index of 28 boosts blood sugar only 28% as much as pure glucose. One with a GI of 95 acts like pure glucose.    High glycemic foods result in a quick spike in insulin and blood sugar (also known as blood glucose).  Low glycemic foods have a slower, smaller effect- these are healthier for you.   Using the glycemic index Using the glycemic index is easy: choose foods in the low GI category instead of those in the high GI category (  see below), and go easy on those in between. Low glycemic index (GI of 55 or less): Most fruits and vegetables, beans, minimally processed grains, pasta, low-fat dairy foods, and nuts.  Moderate glycemic index (GI 56 to 69): White and sweet potatoes, corn, white rice, couscous, breakfast cereals such as Cream of Wheat and Mini Wheats.  High glycemic index (GI of 70 or higher): White bread, rice cakes, most crackers, bagels, cakes, doughnuts, croissants, most packaged breakfast cereals. You can see the values for 100 commons foods and get links to more at www.health.CheapToothpicks.si.  Swaps for lowering glycemic index  Instead of this high-glycemic index food Eat this lower-glycemic index food  White rice Licea rice or converted rice  Instant oatmeal Steel-cut oats  Cornflakes Bran flakes  Baked potato Pasta, bulgur  White bread Whole-grain bread  Corn Peas or leafy greens       Prediabetes Eating Plan  Prediabetes--also  called impaired glucose tolerance or impaired fasting glucose--is a condition that causes blood sugar (blood glucose) levels to be higher than normal. Following a healthy diet can help to keep prediabetes under control. It can also help to lower the risk of type 2 diabetes and heart disease, which are increased in people who have prediabetes. Along with regular exercise, a healthy diet:  Promotes weight loss.  Helps to control blood sugar levels.  Helps to improve the way that the body uses insulin.   WHAT DO I NEED TO KNOW ABOUT THIS EATING PLAN?   Use the glycemic index (GI) to plan your meals. The index tells you how quickly a food will raise your blood sugar. Choose low-GI foods. These foods take a longer time to raise blood sugar.  Pay close attention to the amount of carbohydrates in the food that you eat. Carbohydrates increase blood sugar levels.  Keep track of how many calories you take in. Eating the right amount of calories will help you to achieve a healthy weight. Losing about 7 percent of your starting weight can help to prevent type 2 diabetes.  You may want to follow a Mediterranean diet. This diet includes a lot of vegetables, lean meats or fish, whole grains, fruits, and healthy oils and fats.   WHAT FOODS CAN I EAT?  Grains Whole grains, such as whole-wheat or whole-grain breads, crackers, cereals, and pasta. Unsweetened oatmeal. Bulgur. Barley. Quinoa. Eades rice. Corn or whole-wheat flour tortillas or taco shells. Vegetables Lettuce. Spinach. Peas. Beets. Cauliflower. Cabbage. Broccoli. Carrots. Tomatoes. Squash. Eggplant. Herbs. Peppers. Onions. Cucumbers. Brussels sprouts. Fruits Berries. Bananas. Apples. Oranges. Grapes. Papaya. Mango. Pomegranate. Kiwi. Grapefruit. Cherries. Meats and Other Protein Sources Seafood. Lean meats, such as chicken and [place] or lean cuts of pork and beef. Tofu. Eggs. Nuts. Beans. Dairy Low-fat or fat-free dairy products, such as  yogurt, cottage cheese, and cheese. Beverages Water. Tea. Coffee. Sugar-free or diet soda. Seltzer water. Milk. Milk alternatives, such as soy or almond milk. Condiments Mustard. Relish. Low-fat, low-sugar ketchup. Low-fat, low-sugar barbecue sauce. Low-fat or fat-free mayonnaise. Sweets and Desserts Sugar-free or low-fat pudding. Sugar-free or low-fat ice cream and other frozen treats. Fats and Oils Avocado. Walnuts. Olive oil. The items listed above may not be a complete list of recommended foods or beverages. Contact your dietitian for more options.    WHAT FOODS ARE NOT RECOMMENDED?  Grains Refined white flour and flour products, such as bread, pasta, snack foods, and cereals. Beverages Sweetened drinks, such as sweet iced tea and soda. Sweets and Desserts [employer],  such as cake, cupcakes, pastries, cookies, and cheesecake. The items listed above may not be a complete list of foods and beverages to avoid. Contact your dietitian for more information.   This information is not intended to replace advice given to you by your health care provider. Make sure you discuss any questions you have with your health care provider.   Document Released: 02/11/2015 Document Reviewed: 02/11/2015 Elsevier Interactive Patient Education [employer].

## 2017-04-06 NOTE — Assessment & Plan Note (Addendum)
 -   Reminded patient of lifestyle modifications to include low saturated and Transfats diet;  - Cut back on alcohol  Treatment per cardiology

## 2017-04-06 NOTE — Assessment & Plan Note (Signed)
Encouraged weight loss 

## 2017-04-06 NOTE — Assessment & Plan Note (Signed)
Patient needs to follow up with GI - told him to call for appt

## 2017-04-07 LAB — CBC WITH DIFFERENTIAL/PLATELET
BASOS ABS: 0 10*3/uL (ref 0.0–0.2)
Basos: 0 %
EOS (ABSOLUTE): 0.1 10*3/uL (ref 0.0–0.4)
EOS: 2 %
HEMATOCRIT: 27.6 % — AB (ref 37.5–51.0)
Hemoglobin: 8.4 g/dL — ABNORMAL LOW (ref 13.0–17.7)
IMMATURE GRANS (ABS): 0 10*3/uL (ref 0.0–0.1)
IMMATURE GRANULOCYTES: 0 %
LYMPHS ABS: 0.7 10*3/uL (ref 0.7–3.1)
LYMPHS: 15 %
MCH: 22.5 pg — ABNORMAL LOW (ref 26.6–33.0)
MCHC: 30.4 g/dL — ABNORMAL LOW (ref 31.5–35.7)
MCV: 74 fL — ABNORMAL LOW (ref 79–97)
MONOCYTES: 11 %
Monocytes Absolute: 0.5 10*3/uL (ref 0.1–0.9)
Neutrophils Absolute: 3.3 10*3/uL (ref 1.4–7.0)
Neutrophils: 72 %
Platelets: 354 10*3/uL (ref 150–379)
RBC: 3.73 x10E6/uL — AB (ref 4.14–5.80)
RDW: 16.5 % — ABNORMAL HIGH (ref 12.3–15.4)
WBC: 4.6 10*3/uL (ref 3.4–10.8)

## 2017-04-07 LAB — VITAMIN B12: VITAMIN B 12: 331 pg/mL (ref 232–1245)

## 2017-04-07 LAB — FERRITIN: Ferritin: 19 ng/mL — ABNORMAL LOW (ref 30–400)

## 2017-04-07 LAB — IRON AND TIBC
IRON SATURATION: 5 % — AB (ref 15–55)
IRON: 20 ug/dL — AB (ref 38–169)
TIBC: 397 ug/dL (ref 250–450)
UIBC: 377 ug/dL — AB (ref 111–343)

## 2017-04-07 LAB — HEMOGLOBIN A1C
ESTIMATED AVERAGE GLUCOSE: 123 mg/dL
Hgb A1c MFr Bld: 5.9 % — ABNORMAL HIGH (ref 4.8–5.6)

## 2017-04-07 LAB — FOLATE

## 2017-04-11 ENCOUNTER — Ambulatory Visit (INDEPENDENT_AMBULATORY_CARE_PROVIDER_SITE_OTHER): Payer: Self-pay | Admitting: Physician Assistant

## 2017-04-11 ENCOUNTER — Ambulatory Visit
Admission: RE | Admit: 2017-04-11 | Discharge: 2017-04-11 | Disposition: A | Discharge status: Home / Self Care | Payer: Medicare HMO | Source: Ambulatory Visit | Attending: Cardiothoracic Surgery | Admitting: Cardiothoracic Surgery

## 2017-04-11 VITALS — BP 126/70 | HR 72 | Resp 20 | Ht 71.0 in | Wt 216.0 lb

## 2017-04-11 DIAGNOSIS — Z951 Presence of aortocoronary bypass graft: Secondary | ICD-10-CM

## 2017-04-11 DIAGNOSIS — J90 Pleural effusion, not elsewhere classified: Secondary | ICD-10-CM | POA: Diagnosis not present

## 2017-04-11 MED ORDER — POTASSIUM CHLORIDE ER 20 MEQ PO TBCR: 20.0000 meq | EXTENDED_RELEASE_TABLET | Freq: Every day | ORAL | 1 refills | Status: DC

## 2017-04-11 MED ORDER — FUROSEMIDE 40 MG PO TABS: 40.0000 mg | ORAL_TABLET | Freq: Every day | ORAL | 1 refills | Status: DC

## 2017-04-11 NOTE — Progress Notes (Signed)
[address]       [place] [address]             [phone]       CARDIAC SURGERY POSTOPERATIVE VISIT  Patient Name: Vincent Mcclure MRN: [id number] DOB: January 05, 1932   SURGICAL PROCEDURE:  Coronary artery bypass grafting x3 with left internal mammary to the left anterior descending coronary artery, reverse saphenous vein graft to the diagonal coronary artery, reverse saphenous vein graft to the distal circumflex coronary artery with right thigh greater saphenous endovein harvesting.   Subjective: Vincent Mcclure is a 83 y.o. male here for s/p CABG x 3 by Dr. Servando Snare on 02/15/2017. His only complaint is not urinating as much as usual, but states he was told to stop taking Furosemide. He denies chest pain, shortness of breath, fever, or chills.  Past Medical History:  Diagnosis Date  . Alcohol abuse   . Anemia   . Chronic low back pain 10/14/2011  . Colon polyps    hyperplastic (2004, 2010) and adenomatous (1990).    . COLONIC POLYPS, HX OF 03/05/2008  . Esophageal stricture    hx of  . Gastric ulcer   . GERD 03/05/2008  . GERD (gastroesophageal reflux disease) 1994   associated peptic strictures  . HYPERLIPIDEMIA 03/05/2008  . HYPERTENSION 03/05/2008  . Hypertension   . IBS 03/05/2008  . Impaired glucose tolerance 10/12/2011  . Iron deficiency anemia   . LEG CRAMPS 09/02/2010  . PAD (peripheral artery disease) ([place]) 10/14/2011  . PAD (peripheral artery disease) ([place]) 2013  . PERIPHERAL EDEMA 09/02/2010  . PERIPHERAL NEUROPATHY 03/05/2008  . Prostatitis    hx of  . VARICOSE VEINS, LOWER EXTREMITIES 06/09/2009   Prior to Admission medications   Medication Sig Start Date End Date Taking? Authorizing Provider  amiodarone (PACERONE) 200 MG tablet Take (200 mg ) daily 03/14/17  Yes Imogene Burn, PA-C  aspirin EC 81 MG EC tablet Take 1 tablet (81 mg total) by mouth daily. 02/25/17  Yes Conte, Tessa N, PA-C  ferrous sulfate 325 (65 FE) MG EC tablet Take 1  tablet (325 mg total) by mouth 3 (three) times daily with meals. 04/06/17  Yes Opalski, Neoma Laming, DO  metoprolol tartrate (LOPRESSOR) 25 MG tablet Take 1 tablet (25 mg total) by mouth 2 (two) times daily. 02/25/17  Yes Conte, Tessa N, PA-C  pantoprazole (PROTONIX) 40 MG tablet Take 40 mg by mouth 2 (two) times daily.   Yes [provider]  pravastatin (PRAVACHOL) 40 MG tablet Take 1 tablet (40 mg total) by mouth daily. 08/11/16  Yes Biagio Borg, MD    Physical Exam:  Vitals:   04/11/17 1447  BP: 126/70  Pulse: 72  Resp: 20    GENERAL: Well-nourished, well-developed, in no acute distress  CARDIOVASCULAR: Regular rate and rhythm. No murmur  RESPIRATORY: Respiratory effort is normal. Lungs slightly diminished at bases ABDOMEN: Soft, protuberant,owel sounds present.  EXTREMITIES: +1 LE edema WOUNDS: Clean and dry. No sign of infection.  Imaging Studies: CLINICAL DATA:  Status post CABG on Feb 15, 2017. The patient ports cough, shortness of breath, and fatigue currently.  EXAM: CHEST  2 VIEW  COMPARISON:  PA and lateral chest x-ray of Feb 25, 2017  FINDINGS: The lungs are well-expanded. There is no pneumothorax. There is a small left pleural effusion similar to that seen previously. There is a trace of pleural fluid on the right. The heart is top-normal in size.  The pulmonary vascularity is not engorged. There is calcification in the wall of the thoracic aorta. The sternal wires are intact. The retrosternal soft tissues do not appear abnormal. There is mild multilevel degenerative disc disease of the thoracic spine.  IMPRESSION: Persistent small left pleural effusion and trace right pleural effusion. No pulmonary vascular congestion, pulmonary edema, or pneumonia.   Electronically Signed   By: David  Martinique M.D.   On: 04/11/2017 15:00  Impression/Plan: [name] has already seen the PA from cardiology. She recommended he continue taking Lasix 40 mg daily with a  potassium supplement;however, he has not been taking Lasix or potassium. He has LE edema so I instructed him to take and gave him a prescription for both as well.  I did encourage him to weigh himself daily. If he gains 2-3 pounds in a day, he is to call our office or his cardiologist. I told him to avoid salt, carbohydrates, and sugar. His PCP is following his HGA1C 5.9. Also, her note said he was to take Toprol XL 50 mg daily instead of Lopressor 25 mg bid, but he is still taking the Lopressor. He has an appointment to see the PA-C again on 05/24/2017. He has been seen by his PCP Dr. Raliegh Scarlet. He has a history of IDA. He is taking Ferrous sulfate 325 mg orally three times daily and Protonix 40 mg daily. I will discuss with Dr. Servando Snare if should stop baby ecasa until he sees a gastroenterologist. Patient again encouraged to make appointment with gastroenterologist as he needs an EGD and colonoscopy. He was instructed he may drive, as he is not taking a narcotic for pain. He states he has already been driving. He was instructed to continue with sternal precautions (i.e. No lifting more than 10 pounds) for one more week, then he may gradually return to his normal activities. He was encouraged to participate in cardiac rehab, but he states he does not wish to do so. I also discussed minimizing alcohol. He will return to see Dr. Servando Snare in  3-4  weeks.   Lars Pinks, PA-C 04/11/2017 3:30 PM

## 2017-04-11 NOTE — Patient Instructions (Addendum)
You may continue to gradually increase your physical activity as tolerated.  Refrain from any heavy lifting or strenuous use of your arms and shoulders until at least 8 weeks from the time of your surgery, and avoid activities that cause increased pain in your chest on the side of your surgical incision.  Otherwise you may continue to increase activities without any particular limitations.  Increase the intensity and duration of physical activity gradually.  You are encouraged to enroll and participate in the outpatient cardiac rehab program beginning as soon as practical.  Patient to weigh himself every am. If gains 2-3 pounds in a day, call our office or cardiology.

## 2017-05-02 ENCOUNTER — Other Ambulatory Visit: Payer: Self-pay | Admitting: Physician Assistant

## 2017-05-05 ENCOUNTER — Ambulatory Visit: Payer: Medicare HMO | Admitting: Physician Assistant

## 2017-05-12 ENCOUNTER — Ambulatory Visit (INDEPENDENT_AMBULATORY_CARE_PROVIDER_SITE_OTHER): Payer: Self-pay | Admitting: Cardiothoracic Surgery

## 2017-05-12 ENCOUNTER — Encounter: Payer: Self-pay | Admitting: Cardiothoracic Surgery

## 2017-05-12 VITALS — BP 138/85 | HR 74 | Resp 16 | Ht 71.0 in | Wt 207.0 lb

## 2017-05-12 DIAGNOSIS — Z951 Presence of aortocoronary bypass graft: Secondary | ICD-10-CM

## 2017-05-12 NOTE — Progress Notes (Signed)
[address]       [place] [address]             [phone]      Vincent Mcclure [place] Medical Record #[id number] Date of Birth: 09-19-1932  Referring: Belva Crome, MD Primary Care: [name], DO  Chief Complaint:   POST OP FOLLOW UP 02/15/2017  SURGICAL PROCEDURE: Coronary artery bypass grafting x3 with left internal mammary to the left anterior descending coronary artery, reverse saphenous vein graft to the diagonal coronary artery, reverse saphenous vein graft to the distal circumflex coronary artery with right thigh greater saphenous endovein harvesting.  History of Present Illness:     Patient continues to do well following bypass surgery approximately 80 days ago. He slowly returning to usual activities. Even before surgery he had a high number of falls. In the office today does tell me that he has surveyed his house remove loose rugs uses his cane at home. One fall was on electric cord which he has removed     Past Medical History:  Diagnosis Date  . Alcohol abuse   . Anemia   . Chronic low back pain 10/14/2011  . Colon polyps    hyperplastic (2004, 2010) and adenomatous (1990).    . COLONIC POLYPS, HX OF 03/05/2008  . Esophageal stricture    hx of  . Gastric ulcer   . GERD 03/05/2008  . GERD (gastroesophageal reflux disease) 1994   associated peptic strictures  . HYPERLIPIDEMIA 03/05/2008  . HYPERTENSION 03/05/2008  . Hypertension   . IBS 03/05/2008  . Impaired glucose tolerance 10/12/2011  . Iron deficiency anemia   . LEG CRAMPS 09/02/2010  . PAD (peripheral artery disease) ([place]) 10/14/2011  . PAD (peripheral artery disease) ([place]) 2013  . PERIPHERAL EDEMA 09/02/2010  . PERIPHERAL NEUROPATHY 03/05/2008  . Prostatitis    hx of  . VARICOSE VEINS, LOWER EXTREMITIES 06/09/2009     History  Smoking Status  . Former Smoker  . Packs/day: 1.25  . Years: 50.00  . Types: Cigarettes  . Quit date: 11/11/2008  Smokeless Tobacco  .  Former Systems developer  . Types: Chew    History  Alcohol Use  . 3.6 oz/week  . 2 Glasses of wine, 4 Cans of beer per week     Allergies  Allergen Reactions  . No Known Allergies     Current Outpatient Prescriptions  Medication Sig Dispense Refill  . amiodarone (PACERONE) 200 MG tablet Take (200 mg ) daily 30 tablet 9  . aspirin EC 81 MG EC tablet Take 1 tablet (81 mg total) by mouth daily. 30 tablet 1  . ferrous sulfate 325 (65 FE) MG EC tablet Take 1 tablet (325 mg total) by mouth 3 (three) times daily with meals. 90 tablet 11  . furosemide (LASIX) 40 MG tablet Take 1 tablet (40 mg total) by mouth daily. 30 tablet 1  . metoprolol tartrate (LOPRESSOR) 25 MG tablet Take 1 tablet (25 mg total) by mouth 2 (two) times daily. 30 tablet 1  . potassium chloride 20 MEQ TBCR Take 20 mEq by mouth daily. 30 tablet 1  . pravastatin (PRAVACHOL) 40 MG tablet Take 1 tablet (40 mg total) by mouth daily. 90 tablet 3   No current facility-administered medications for this visit.        Physical Exam: BP 138/85 (BP Location: Right Arm, Patient Position: Sitting, Cuff Size: Large)   Pulse 74   Resp 16  Ht 5\' 11"  (1.803 m)   Wt 207 lb (93.9 kg)   SpO2 98% Comment: ON RA  BMI 28.87 kg/m   General appearance: alert, cooperative and no distress Neurologic: intact Heart: regular rate and rhythm, S1, S2 normal, no murmur, click, rub or gallop Lungs: clear to auscultation bilaterally Abdomen: soft, non-tender; bowel sounds normal; no masses,  no organomegaly Extremities: extremities normal, atraumatic, no cyanosis or edema and Homans sign is negative, no sign of DVT Wound: Sternum is stable and well healed   Diagnostic Studies & Laboratory data:     Recent Radiology Findings:   No results found.    Recent Lab Findings: Lab Results  Component Value Date   WBC 4.6 04/06/2017   HGB 8.4 (L) 04/06/2017   HCT 27.6 (L) 04/06/2017   PLT 354 04/06/2017   GLUCOSE 91 03/14/2017   CHOL 145 02/12/2017    TRIG 83 02/12/2017   HDL 43 02/12/2017   LDLDIRECT 133.2 10/14/2011   LDLCALC 85 02/12/2017   ALT 13 (L) 02/20/2017   AST 21 02/20/2017   NA 134 03/14/2017   K 4.0 03/14/2017   CL 95 (L) 03/14/2017   CREATININE 1.33 (H) 03/14/2017   BUN 18 03/14/2017   CO2 22 03/14/2017   TSH 1.10 08/11/2016   INR 1.36 02/15/2017   HGBA1C 5.9 (H) 04/06/2017      Assessment / Plan:      Patient now almost 3 months postop is doing well following his urgent coronary artery bypass graft.  He appears to be in sinus rhythm today, he was felt to be at very high risk for leading and has not been fully anticoagulated for his history of atrial fibrillation. He will discuss with cardiology on his next visit about stopping his amiodarone I've not made him a return appointment to be seen in the surgery office but would be glad to see him as needed at the request cardiology.     Grace Isaac MD      [address] [place] [address] Office [phone]   Beeper [phone]  05/12/2017 2:11 PM

## 2017-05-24 ENCOUNTER — Encounter: Payer: Self-pay | Admitting: Physician Assistant

## 2017-05-24 ENCOUNTER — Ambulatory Visit (INDEPENDENT_AMBULATORY_CARE_PROVIDER_SITE_OTHER): Payer: Medicare HMO | Admitting: Physician Assistant

## 2017-05-24 VITALS — BP 142/82 | HR 76 | Ht 71.0 in | Wt 209.0 lb

## 2017-05-24 DIAGNOSIS — I25810 Atherosclerosis of coronary artery bypass graft(s) without angina pectoris: Secondary | ICD-10-CM | POA: Diagnosis not present

## 2017-05-24 DIAGNOSIS — E782 Mixed hyperlipidemia: Secondary | ICD-10-CM

## 2017-05-24 DIAGNOSIS — D508 Other iron deficiency anemias: Secondary | ICD-10-CM

## 2017-05-24 DIAGNOSIS — I10 Essential (primary) hypertension: Secondary | ICD-10-CM | POA: Diagnosis not present

## 2017-05-24 DIAGNOSIS — Z789 Other specified health status: Secondary | ICD-10-CM

## 2017-05-24 DIAGNOSIS — I480 Paroxysmal atrial fibrillation: Secondary | ICD-10-CM | POA: Diagnosis not present

## 2017-05-24 DIAGNOSIS — F1090 Alcohol use, unspecified, uncomplicated: Secondary | ICD-10-CM

## 2017-05-24 DIAGNOSIS — Z7289 Other problems related to lifestyle: Secondary | ICD-10-CM

## 2017-05-24 LAB — BASIC METABOLIC PANEL
BUN/Creatinine Ratio: 14 (ref 10–24)
BUN: 15 mg/dL (ref 8–27)
CO2: 24 mmol/L (ref 20–29)
Calcium: 9.5 mg/dL (ref 8.6–10.2)
Chloride: 101 mmol/L (ref 96–106)
Creatinine, Ser: 1.1 mg/dL (ref 0.76–1.27)
GFR calc Af Amer: 71 mL/min/{1.73_m2} (ref >59)
GFR, EST NON AFRICAN AMERICAN: 62 mL/min/{1.73_m2} (ref >59)
GLUCOSE: 67 mg/dL (ref 65–99)
POTASSIUM: 4.7 mmol/L (ref 3.5–5.2)
SODIUM: 139 mmol/L (ref 134–144)

## 2017-05-24 LAB — CBC
HEMATOCRIT: 38 % (ref 37.5–51.0)
Hemoglobin: 12.3 g/dL — ABNORMAL LOW (ref 13.0–17.7)
MCH: 26.5 pg — ABNORMAL LOW (ref 26.6–33.0)
MCHC: 32.4 g/dL (ref 31.5–35.7)
MCV: 82 fL (ref 79–97)
Platelets: 254 10*3/uL (ref 150–379)
RBC: 4.65 x10E6/uL (ref 4.14–5.80)
RDW: 23.8 % — AB (ref 12.3–15.4)
WBC: 5.1 10*3/uL (ref 3.4–10.8)

## 2017-05-24 MED ORDER — METOPROLOL SUCCINATE ER 50 MG PO TB24: 50.0000 mg | ORAL_TABLET | Freq: Every day | ORAL | 11 refills | Status: DC

## 2017-05-24 NOTE — Patient Instructions (Addendum)
Medication Instructions:  Your physician has recommended you make the following change in your medication: 1.) STOP the Amiodarone. 2.) START Toprolol XL 50 mg daily. A new prescription was sent to your pharmacy.   Labwork: Your physician recommends that you return for lab work today for CBC, BMET  Testing/Procedures: None Ordered   Follow-Up: Your physician recommends that you schedule a follow-up appointment in: with Dr. Meda Coffee on 08/10/2017 at 11:20  Any Other Special Instructions Will Be Listed Below (If Applicable).   Heart-Healthy Eating Plan Heart-healthy meal planning includes:  Limiting unhealthy fats.  Increasing healthy fats.  Making other small dietary changes.  You may need to talk with your doctor or a diet specialist (dietitian) to create an eating plan that is right for you. What types of fat should I choose?  Choose healthy fats. These include olive oil and canola oil, flaxseeds, walnuts, almonds, and seeds.  Eat more omega-3 fats. These include salmon, mackerel, sardines, tuna, flaxseed oil, and ground flaxseeds. Try to eat fish at least twice each week.  Limit saturated fats. ? Saturated fats are often found in animal products, such as meats, butter, and cream. ? Plant sources of saturated fats include palm oil, palm kernel oil, and coconut oil.  Avoid foods with partially hydrogenated oils in them. These include stick margarine, some tub margarines, cookies, crackers, and other baked goods. These contain trans fats. What general guidelines do I need to follow?  Check food labels carefully. Identify foods with trans fats or high amounts of saturated fat.  Fill one half of your plate with vegetables and green salads. Eat 4-5 servings of vegetables per day. A serving of vegetables is: ? 1 cup of raw leafy vegetables. ?  cup of raw or cooked cut-up vegetables. ?  cup of vegetable juice.  Fill one fourth of your plate with whole grains. Look for the word  "whole" as the first word in the ingredient list.  Fill one fourth of your plate with lean protein foods.  Eat 4-5 servings of fruit per day. A serving of fruit is: ? One medium whole fruit. ?  cup of dried fruit. ?  cup of fresh, frozen, or canned fruit. ?  cup of 100% fruit juice.  Eat more foods that contain soluble fiber. These include apples, broccoli, carrots, beans, peas, and barley. Try to get 20-30 g of fiber per day.  Eat more home-cooked food. Eat less restaurant, buffet, and fast food.  Limit or avoid alcohol.  Limit foods high in starch and sugar.  Avoid fried foods.  Avoid frying your food. Try baking, boiling, grilling, or broiling it instead. You can also reduce fat by: ? Removing the skin from poultry. ? Removing all visible fats from meats. ? Skimming the fat off of stews, soups, and gravies before serving them. ? Steaming vegetables in water or broth.  Lose weight if you are overweight.  Eat 4-5 servings of nuts, legumes, and seeds per week: ? One serving of dried beans or legumes equals  cup after being cooked. ? One serving of nuts equals 1 ounces. ? One serving of seeds equals  ounce or one tablespoon.  You may need to keep track of how much salt or sodium you eat. This is especially true if you have high blood pressure. Talk with your doctor or dietitian to get more information. What foods can I eat? Grains Breads, including [place], white, pita, wheat, raisin, rye, oatmeal, and [place]. Tortillas that are neither fried  nor made with lard or trans fat. Low-fat rolls, including hotdog and hamburger buns and English muffins. Biscuits. Muffins. Waffles. Pancakes. Light popcorn. Whole-grain cereals. Flatbread. Melba toast. Pretzels. Breadsticks. Rusks. Low-fat snacks. Low-fat crackers, including oyster, saltine, matzo, graham, animal, and rye. Rice and pasta, including [place] rice and pastas that are made with whole wheat. Vegetables All  vegetables. Fruits All fruits, but limit coconut. Meats and Other Protein Sources Lean, well-trimmed beef, veal, pork, and lamb. Chicken and [place] without skin. All fish and shellfish. Wild duck, rabbit, pheasant, and venison. Egg whites or low-cholesterol egg substitutes. Dried beans, peas, lentils, and tofu. Seeds and most nuts. Dairy Low-fat or nonfat cheeses, including ricotta, string, and mozzarella. Skim or 1% milk that is liquid, powdered, or evaporated. Buttermilk that is made with low-fat milk. Nonfat or low-fat yogurt. Beverages Mineral water. Diet carbonated beverages. Sweets and Desserts Sherbets and fruit ices. Honey, jam, marmalade, jelly, and syrups. Meringues and gelatins. Pure sugar candy, such as hard candy, jelly beans, gumdrops, mints, marshmallows, and small amounts of dark chocolate. [employer] Eat all sweets and desserts in moderation. Fats and Oils Nonhydrogenated (trans-free) margarines. Vegetable oils, including soybean, sesame, sunflower, olive, peanut, safflower, corn, canola, and cottonseed. Salad dressings or mayonnaise made with a vegetable oil. Limit added fats and oils that you use for cooking, baking, salads, and as spreads. Other Cocoa powder. Coffee and tea. All seasonings and condiments. The items listed above may not be a complete list of recommended foods or beverages. Contact your dietitian for more options. What foods are not recommended? Grains Breads that are made with saturated or trans fats, oils, or whole milk. Croissants. Butter rolls. Cheese breads. Sweet rolls. Donuts. Buttered popcorn. Chow mein noodles. High-fat crackers, such as cheese or butter crackers. Meats and Other Protein Sources Fatty meats, such as hotdogs, short ribs, sausage, spareribs, bacon, rib eye roast or steak, and mutton. High-fat deli meats, such as salami and bologna. Caviar. Domestic duck and goose. Organ meats, such as kidney, liver, sweetbreads, and  heart. Dairy Cream, sour cream, cream cheese, and creamed cottage cheese. Whole-milk cheeses, including blue (bleu), Monterey Jack, [place], [place], American, [place], Swiss, cheddar, [place], and Marmet. Whole or 2% milk that is liquid, evaporated, or condensed. Whole buttermilk. Cream sauce or high-fat cheese sauce. Yogurt that is made from whole milk. Beverages Regular sodas and juice drinks with added sugar. Sweets and Desserts Frosting. Pudding. Cookies. Cakes other than angel food cake. Candy that has milk chocolate or white chocolate, hydrogenated fat, butter, coconut, or unknown ingredients. Buttered syrups. Full-fat ice cream or ice cream drinks. Fats and Oils Gravy that has suet, meat fat, or shortening. Cocoa butter, hydrogenated oils, palm oil, coconut oil, palm kernel oil. These can often be found in baked products, candy, fried foods, nondairy creamers, and whipped toppings. Solid fats and shortenings, including bacon fat, salt pork, lard, and butter. Nondairy cream substitutes, such as coffee creamers and sour cream substitutes. Salad dressings that are made of unknown oils, cheese, or sour cream. The items listed above may not be a complete list of foods and beverages to avoid. Contact your dietitian for more information. This information is not intended to replace advice given to you by your health care provider. Make sure you discuss any questions you have with your health care provider. Document Released: 03/28/2012 Document Revised: 03/04/2016 Document Reviewed: 03/21/2014 Elsevier Interactive Patient Education  [employer].    If you need a refill on your cardiac medications before  your next appointment, please call your pharmacy.

## 2017-05-24 NOTE — Progress Notes (Signed)
Cardiology Office Note    Date:  05/24/2017   ID:  Vincent Mcclure, DOB 05/25/1932, MRN [id number]  PCP:  [name], DO  Cardiologist: Dr. Meda Coffee  Chief Complaint  Patient presents with  . Follow-up    s/p CABG x 3    History of Present Illness:  Vincent Mcclure is a 83 y.o. male  with history of hypertension, HLD, gastric ulcers with big GI bleed in 2016 secondary to alcohol use and previous tobacco abuse. He was admitted with NSTEMI and was found to have multivessel CAD and preserved LV function at cardiac cath. He underwent CABG 3 02/15/17. He had postop atrial fibrillation treated with amiodarone and converted to normal sinus rhythm.CHADSVASC=4 but HSBED score is 5 so no anticoagulation at this time per Dr. Sallyanne Kuster. Bleeding risk exceeds potential stroke reduction benefit. He also had a 5% pneumothorax and acute kidney injury followed by nephrology.  I saw the patient postop 03/14/17. He had run out of metoprolol and Lasix and was not interested in cardiac rehabilitation. He had cut back on his alcohol use. I decrease his amiodarone to 200 mg once daily and resumed his Toprol Lasix and potassium. Patient saw Dr. Pia Mau and 05/12/17 and was doing well.  Patient comes in today for f/u. He has anemia with Hbg 8.4 and is back on iron. Walking some but legs hurt Especially at night. He doesn't like to exercise regularly but says he stays very active and has no cardiac complaints. He denies chest pain, palpitations, dyspnea, dyspnea on exertion, dizziness or presyncope. Primary care notes say they referred him back to GI but he says he hasn't heard about any appointment. He is drinking several beers a day or vodka drinks.     Past Medical History:  Diagnosis Date  . Alcohol abuse   . Anemia   . Chronic low back pain 10/14/2011  . Colon polyps    hyperplastic (2004, 2010) and adenomatous (1990).    . COLONIC POLYPS, HX OF 03/05/2008  . Esophageal stricture    hx of  . Gastric ulcer    . GERD 03/05/2008  . GERD (gastroesophageal reflux disease) 1994   associated peptic strictures  . HYPERLIPIDEMIA 03/05/2008  . HYPERTENSION 03/05/2008  . Hypertension   . IBS 03/05/2008  . Impaired glucose tolerance 10/12/2011  . Iron deficiency anemia   . LEG CRAMPS 09/02/2010  . PAD (peripheral artery disease) ([place]) 10/14/2011  . PAD (peripheral artery disease) ([place]) 2013  . PERIPHERAL EDEMA 09/02/2010  . PERIPHERAL NEUROPATHY 03/05/2008  . Prostatitis    hx of  . VARICOSE VEINS, LOWER EXTREMITIES 06/09/2009    Past Surgical History:  Procedure Laterality Date  . CATARACT EXTRACTION  bilat  . CORONARY ARTERY BYPASS GRAFT N/A 02/15/2017   Procedure: CORONARY ARTERY BYPASS GRAFTING times three  with left internal mammary harvest and endoscopic harvest of Right SVG. Grafts of LIMA to  LAD, SVG to Distal Circ, and to First Diag.;  Surgeon: Grace Isaac, MD;  Location: [facility];  Service: Open Heart Surgery;  Laterality: N/A;  . ESOPHAGOGASTRODUODENOSCOPY N/A 11/14/2014   Procedure: ESOPHAGOGASTRODUODENOSCOPY (EGD);  Surgeon: Jerene Bears, MD;  Location: [facility] ENDOSCOPY;  Service: Endoscopy;  Laterality: N/A;  . LEFT HEART CATH AND CORONARY ANGIOGRAPHY N/A 02/14/2017   Procedure: Left Heart Cath and Coronary Angiography;  Surgeon: Belva Crome, MD;  Location: [facility];  Service: Cardiovascular;  Laterality: N/A;  . LUMBAR SPINE SURGERY  11/2008  Dr Joya Salm  . TEE WITHOUT CARDIOVERSION N/A 02/15/2017   Procedure: TRANSESOPHAGEAL ECHOCARDIOGRAM (TEE);  Surgeon: Grace Isaac, MD;  Location: [facility];  Service: Open Heart Surgery;  Laterality: N/A;    Current Medications: Current Meds  Medication Sig  . amiodarone (PACERONE) 200 MG tablet Take (200 mg ) daily  . aspirin EC 81 MG EC tablet Take 1 tablet (81 mg total) by mouth daily.  . ferrous sulfate 325 (65 FE) MG EC tablet Take 1 tablet (325 mg total) by mouth 3 (three) times daily with meals.  . furosemide (LASIX) 40 MG tablet Take  1 tablet (40 mg total) by mouth daily.  . metoprolol tartrate (LOPRESSOR) 25 MG tablet Take 25 mg by mouth daily.  . potassium chloride 20 MEQ TBCR Take 20 mEq by mouth daily.  . pravastatin (PRAVACHOL) 40 MG tablet Take 1 tablet (40 mg total) by mouth daily.     Allergies:   No known allergies   Social History   Social History  . Marital status: Married    Spouse name: N/A  . Number of children: 4  . Years of education: N/A   Occupational History  . Retired Engineer, maintenance    Social History Main Topics  . Smoking status: Former Smoker    Packs/day: 1.25    Years: 50.00    Types: Cigarettes    Quit date: 11/11/2008  . Smokeless tobacco: Former Systems developer    Types: Chew  . Alcohol use 3.6 oz/week    2 Glasses of wine, 4 Cans of beer per week  . Drug use: No  . Sexual activity: No   Other Topics Concern  . None   Social History Narrative   ** Merged History Encounter **       4 sons     Family History:  The patient's family history includes Cancer in his mother; Diabetes in his father and sister; Heart disease in his father; Hypertension in his father; Stroke in his father.   ROS:   Please see the history of present illness.    Review of Systems  Constitution: Positive for weakness.  HENT: Negative.   Cardiovascular: Positive for leg swelling.  Respiratory: Negative.   Endocrine: Negative.   Hematologic/Lymphatic: Negative.   Musculoskeletal: Positive for myalgias.  Gastrointestinal: Negative.   Genitourinary: Negative.    All other systems reviewed and are negative.   PHYSICAL EXAM:   VS:  BP (!) 142/82   Pulse 76   Ht 5\' 11"  (1.803 m)   Wt 209 lb (94.8 kg)   BMI 29.15 kg/m   Physical Exam  GEN: Well nourished, well developed, in no acute distress  Neck: no JVD, carotid bruits, or masses Cardiac:RRR; Positive S4 no murmurs, rubs Respiratory:  clear to auscultation bilaterally, normal work of breathing GI: soft, nontender, nondistended, + BS Ext: +1 edema  bilaterally without cyanosis, clubbing.Good distal pulses bilaterally Neuro:  Alert and Oriented x 3,  Psych: euthymic mood, full affect  Wt Readings from Last 3 Encounters:  05/24/17 209 lb (94.8 kg)  05/12/17 207 lb (93.9 kg)  04/11/17 216 lb (98 kg)      Studies/Labs Reviewed:   EKG:  EKG is ordered today.  The ekg ordered today demonstrates Normal sinus rhythm with first-degree AV block  Recent Labs: 08/11/2016: TSH 1.10 02/16/2017: Magnesium 2.3 02/20/2017: ALT 13 03/14/2017: BUN 18; Creatinine, Ser 1.33; Potassium 4.0; Sodium 134 04/06/2017: Hemoglobin 8.4; Platelets 354   Lipid Panel    Component  Value Date/Time   CHOL 145 02/12/2017 0657   TRIG 83 02/12/2017 0657   HDL 43 02/12/2017 0657   CHOLHDL 3.4 02/12/2017 0657   VLDL 17 02/12/2017 0657   LDLCALC 85 02/12/2017 0657   LDLDIRECT 133.2 10/14/2011 1106    Additional studies/ records that were reviewed today include:    2-D echo 02/14/17 Study Conclusions   - Left ventricle: The cavity size was mildly dilated. Systolic   function was normal. The estimated ejection fraction was in the   range of 55% to 60%. Wall motion was normal; there were no   regional wall motion abnormalities. Left ventricular diastolic   function parameters were normal. - Aortic valve: Poorly visualized. Trileaflet; mildly thickened,   mildly calcified leaflets LA normal in size  SURGICAL PROCEDURE:  Coronary artery bypass grafting x3 with left internal mammary to the left anterior descending coronary artery, reverse saphenous vein graft to the diagonal coronary artery, reverse saphenous vein graft to the distal circumflex coronary artery with right thigh greater saphenous endovein harvesting       Conclusion   Heavily calcified left main and proximal to mid LAD.  Eccentric 75% distal left main  Mid LAD [place] 0, 1, 1 bifurcation stenosis with 90% LAD and 75% second diagonal.  Subtotal occlusion, 99%, of the moderate sized first  diagonal, possibly the source of the patient's presentation.  Ostial 80% circumflex. The distal circumflex is relatively small with 2 small to intermediate size branches distally.  Mid to distal 70% PDA. The right coronary is dominant and otherwise widely patent.  Mild hypokinesis mid anterior wall, likely due to occlusion of the first diagonal. Overall LV function is normal with EF 55%. Normal left ventricular filling pressures.   Recommendations:    Surgical consultation via [facility] for consideration of bypass surgery due to left main coronary disease and complex mid LAD stenoses        ASSESSMENT:    1. Coronary artery disease involving coronary bypass graft of native heart without angina pectoris   2. PAF (paroxysmal atrial fibrillation) ([place])   3. Essential hypertension   4. Mixed hyperlipidemia   5. Alcohol use/ h/o heavy abuse   6. Other iron deficiency anemia      PLAN:  In order of problems listed above:  CAD status post CABG 02/15/17 Doing well 3 months postop. He does have some leg swelling. Continue Lasix and potassium. 2 g sodium diet. Check renal function today. Follow-up with Dr. Meda Coffee in 2 months.  PAF postop maintaining normal sinus rhythm on amiodarone. Not on anticoagulation because of bleeding risk being higher than his stroke risk. We'll stop amiodarone and change beta blocker to Toprol-XL 50 mg once daily. Patient was taking metoprolol 25 mg twice a day but most recently has only been taking it once a day.  Essential hypertension controlled  Mixed hyperlipidemia continue pravastatin  History of heavy alcohol use patient is drinking a low more than he had been last time I saw him. I've asked him to limit his alcohol intake.  Anemia with hemoglobin of 8.4 in June. Patient has been taking iron and says he feels stronger. We will recheck his CBC today.  Medication Adjustments/Labs and Tests Ordered: Current medicines are reviewed at length with the patient  today.  Concerns regarding medicines are outlined above.  Medication changes, Labs and Tests ordered today are listed in the Patient Instructions below. There are no Patient Instructions on file for this visit.   Signed, Vincent Eon  Vita Mcclure  05/24/2017 11:10 AM    [facility] [address], [place], [place]  [address] Phone: [phone]; Fax: [phone]

## 2017-05-27 ENCOUNTER — Telehealth: Payer: Self-pay | Admitting: Physician Assistant

## 2017-05-27 NOTE — Telephone Encounter (Signed)
°  Follow Up  Calling returning test results. Please call.

## 2017-05-27 NOTE — Telephone Encounter (Signed)
Pt aware of lab results ./cy 

## 2017-05-30 ENCOUNTER — Encounter: Payer: Self-pay | Admitting: *Deleted

## 2017-07-07 ENCOUNTER — Ambulatory Visit (INDEPENDENT_AMBULATORY_CARE_PROVIDER_SITE_OTHER): Payer: Medicare HMO | Admitting: Family Medicine

## 2017-07-07 ENCOUNTER — Encounter: Payer: Self-pay | Admitting: Family Medicine

## 2017-07-07 VITALS — BP 146/84 | HR 70 | Ht 71.0 in | Wt 223.3 lb

## 2017-07-07 DIAGNOSIS — I25810 Atherosclerosis of coronary artery bypass graft(s) without angina pectoris: Secondary | ICD-10-CM | POA: Diagnosis not present

## 2017-07-07 DIAGNOSIS — I739 Peripheral vascular disease, unspecified: Secondary | ICD-10-CM | POA: Diagnosis not present

## 2017-07-07 DIAGNOSIS — Z951 Presence of aortocoronary bypass graft: Secondary | ICD-10-CM | POA: Diagnosis not present

## 2017-07-07 DIAGNOSIS — R7302 Impaired glucose tolerance (oral): Secondary | ICD-10-CM | POA: Diagnosis not present

## 2017-07-07 DIAGNOSIS — I214 Non-ST elevation (NSTEMI) myocardial infarction: Secondary | ICD-10-CM

## 2017-07-07 DIAGNOSIS — E782 Mixed hyperlipidemia: Secondary | ICD-10-CM

## 2017-07-07 DIAGNOSIS — Z7289 Other problems related to lifestyle: Secondary | ICD-10-CM

## 2017-07-07 DIAGNOSIS — Z789 Other specified health status: Secondary | ICD-10-CM | POA: Diagnosis not present

## 2017-07-07 DIAGNOSIS — I10 Essential (primary) hypertension: Secondary | ICD-10-CM

## 2017-07-07 DIAGNOSIS — I480 Paroxysmal atrial fibrillation: Secondary | ICD-10-CM | POA: Diagnosis not present

## 2017-07-07 DIAGNOSIS — D508 Other iron deficiency anemias: Secondary | ICD-10-CM | POA: Diagnosis not present

## 2017-07-07 LAB — POCT HEMOGLOBIN: Hemoglobin: 10.3 g/dL — AB (ref 14.1–18.1)

## 2017-07-07 NOTE — Progress Notes (Signed)
Impression and Recommendations:    1. Other iron deficiency anemia   2. Essential hypertension   3. Alcohol use/ h/o heavy abuse   4. S/P CABG x 3   5. Glucose intolerance (impaired glucose tolerance)   6. Coronary artery disease involving coronary bypass graft of native heart without angina pectoris   7. s/p NSTEMI (non-ST elevated myocardial infarction); with subsequent urgent CABG  5/18 ([place])   8. PAF (paroxysmal atrial fibrillation) ([place])   9. PAD (peripheral artery disease) ([place])   10. Mixed hyperlipidemia     Other iron deficiency anemia - Plan: POCT hemoglobin  Essential hypertension  Alcohol use/ h/o heavy abuse  S/P CABG x 3  Glucose intolerance (impaired glucose tolerance)  Coronary artery disease involving coronary bypass graft of native heart without angina pectoris  s/p NSTEMI (non-ST elevated myocardial infarction); with subsequent urgent CABG  5/18 ([place])  PAF (paroxysmal atrial fibrillation) ([place])  PAD (peripheral artery disease) ([place])  Mixed hyperlipidemia  s/p NSTEMI (non-ST elevated myocardial infarction); with subsequent urgent CABG  5/18 [facility]) - Encouraged regular activity  Duanne Guess program daily to goal 25 steps twice daily; incrementally increasing length of time walking as tolerated with his peripheral arterial disease - Patient has follow-up with cardiology near future.  Encouraged patient to discuss any exertional chest discomfort he is having with them.  None currently.  Patient stable - Also encouraged patient to talk with his cardiologist about any leg pains he may be having after walking a certain distance. ( PAD Sx)    PAF (paroxysmal atrial fibrillation) ([place]) - Management per cardiology.   - Patient is not on anticoagulation due to bleeding risk being higher than stroke risk. - They recently stopped amiodarone and changed beta blocker to Toprol-XL ( was on metoprolol 25 twice a day, but patient with history of noncompliance and only  had been taking it once daily)   PAD (peripheral artery disease) ([place]) - Having leg symptoms after walking a certain distance.  Encouraged patient to discuss with his cardiologist next office visit- in a couple weeks.  HTN (hypertension) Well-controlled, management per cardiology - Encouraged regular exercise - Low salt diet - Medication compliance   HLD (hyperlipidemia) - Management per cardiology on pravastatin. - Lipid profile done 4 months ago.  LDL was above 70 but less than 100 - obtain CMP in 29mo-- was done 4 mo ago  Glucose intolerance (impaired glucose tolerance) A1c- stable; recently was 5.9 end of June - Prediabetic prudent diet discussed with patient. - Continue to monitor.  Anemia, iron deficiency Patient has been noncompliant with his medications for the past 8-10 days - Told to continue her iron supplementation 2-3 times daily minimum. - Patient encouraged to eat kale and spinach which she loves also other high iron foods. - continue to monitor CBC next office visit  Alcohol use/ h/o heavy abuse Highly encouraged to only drink 2 drinks daily - which I reviewed with patient is only 3 ounces of hard liquor\alcohol per day - Detrimental effects on blood sugar, blood pressure and cardiovascular system reviewed with patient let alone organ damage which can ensue   The patient was counseled, risk factors were discussed, anticipatory guidance given.   New Prescriptions   No medications on file    Discontinued Medications   METOPROLOL TARTRATE (LOPRESSOR) 25 MG TABLET    Take 25 mg by mouth daily.    Modified Medications   No medications on file     No  orders of the defined types were placed in this encounter.    Orders Placed This Encounter  Procedures  . POCT hemoglobin     Gross side effects, risk and benefits, and alternatives of medications and treatment plan in general discussed with patient.  Patient is aware that all medications have potential  side effects and we are unable to predict every side effect or drug-drug interaction that may occur.   Patient will call with any questions prior to using medication if they have concerns.  Expresses verbal understanding and consents to current therapy and treatment regimen.  No barriers to understanding were identified.  Red flag symptoms and signs discussed in detail.  Patient expressed understanding regarding what to do in case of emergency\urgent symptoms  Please see AVS handed out to patient at the end of our visit for further patient instructions/ counseling done pertaining to today's office visit.   Return in about 4 months (around 11/06/2017).     Note: This document was prepared using Dragon voice recognition software and may include unintentional dictation errors.  Vincent Mcclure 10:32 AM --------------------------------------------------------------------------------------------------------------------------------------------------------------------------------------------------------------------------------------------    Subjective:    CC: No chief complaint on file.   HPI: Vincent Mcclure is a 83 y.o. male who presents to [place] at [facility] today for issues as discussed below.    ABout 3-4 mo s/p CABG * 3.   Had some anemia post-op.   Quit iron tabs 8-10 days ago-  Feeling better and more energy  -   Back to drinking ETOH-   5-8 etoh = bev/day most days at [facility].  ----------------------------------  Per recent cardiology office visit note assessment and plan 05/24/17:  CAD status post CABG 02/15/17.  Doing well 3-4 months postop. He does have some leg swelling. Continue Lasix and potassium.  2 g sodium diet.  Check renal function today.  Follow-up with Dr. Meda Coffee in 2 months.  PAF postop maintaining normal sinus rhythm on amiodarone. Not on anticoagulation because of bleeding risk being higher than his stroke risk. We'll stop amiodarone and  change beta blocker to Toprol-XL 50 mg once daily. Patient was taking metoprolol 25 mg twice a day but most recently has only been taking it once a day.  Essential hypertension controlled  Mixed hyperlipidemia continue pravastatin  History of heavy alcohol use patient is drinking a low more than he had been last time I saw him. I've asked him to limit his alcohol intake.  Anemia with hemoglobin of 8.4 in June. Patient has been taking iron and says he feels stronger. We will recheck his CBC today.  Problem  Cad (Coronary Artery Disease) of Artery Bypass Graft   CAD status post CABG 02/15/17.   Doing well 3-4 months postop.  He does have some L leg swelling.   - Patient is being monitored/ txed  by cardiology and been given Lasix, and potassium -    Paf (Paroxysmal Atrial Fibrillation) ([name])  s/p NSTEMI (non-ST elevated myocardial infarction); with subsequent urgent CABG  5/18 [facility])   - CAD status post CABG 02/15/17.  Doing well 3-4 months postop. He does have some L leg swelling- which is improving some as he becomes more active.   - Getting around house ok - no falls since his operation.  Cleared home of obstacles/ rugs some.  Using cane rarely. - Being treated and monitored by cardiology.  He is on Lasix and potassium. - Patient refuses to do any cardiac rehabilitation, however has  been more active at home and recently did a little trimming and grass cutting. - Having some chest discomfort with exertion\ lifting of heavy things, but symptoms Fleeing once he rests - Has follow-up with Dr. Meda Coffee in October   Pad (Peripheral Artery Disease) ([name])  Glucose Intolerance (Impaired Glucose Tolerance)  Hld (Hyperlipidemia)   Qualifier: Diagnosis of  By: Elveria Royals    Htn (Hypertension)   Qualifier: Diagnosis of  By: Elveria Royals    Anemia, Iron Deficiency   Qualifier: Diagnosis of  By: Jenny Reichmann MD, Hunt Oris    Alcohol use/ h/o heavy abuse     Wt Readings from  Last 3 Encounters:  07/07/17 223 lb 4.8 oz (101.3 kg)  05/24/17 209 lb (94.8 kg)  05/12/17 207 lb (93.9 kg)   BP Readings from Last 3 Encounters:  07/07/17 (!) 146/84  05/24/17 (!) 142/82  05/12/17 138/85   Pulse Readings from Last 3 Encounters:  07/07/17 70  05/24/17 76  05/12/17 74   BMI Readings from Last 3 Encounters:  07/07/17 31.14 kg/m  05/24/17 29.15 kg/m  05/12/17 28.87 kg/m     Patient Care Team    Relationship Specialty Notifications Start End  [name], DO PCP - General Family Medicine  09/29/16   Pyrtle, Lajuan Lines, MD Consulting Physician Gastroenterology  09/29/16   Allyn Kenner, MD Consulting Physician Dermatology  09/29/16   Dorothy Spark, MD Consulting Physician Cardiology  04/06/17   Levin Erp, [place] Physician Assistant Gastroenterology  07/07/17   Murrell Converse Physician Assistant Cardiology  07/07/17      Patient Active Problem List   Diagnosis Date Noted  . CAD (coronary artery disease) of artery bypass graft 03/14/2017    Priority: High  . PAF (paroxysmal atrial fibrillation) ([name])     Priority: High  . s/p NSTEMI (non-ST elevated myocardial infarction); with subsequent urgent CABG  5/18 ([place]) 02/12/2017    Priority: High  . PAD (peripheral artery disease) ([place]) 10/14/2011    Priority: High  . Glucose intolerance (impaired glucose tolerance) 10/12/2011    Priority: High  . HLD (hyperlipidemia) 03/05/2008    Priority: High  . HTN (hypertension) 03/05/2008    Priority: High  . Overweight (BMI 25.0-29.9) 09/29/2016    Priority: Medium  . GI bleed 11/14/2014    Priority: Medium  . Acute esophagitis     Priority: Medium  . Acute gastric ulcer     Priority: Medium  . Duodenal ulcer disease     Priority: Medium  . Anemia, iron deficiency 03/05/2008    Priority: Medium  . GERD 03/05/2008    Priority: Medium  . IBS 03/05/2008    Priority: Medium  . COLONIC POLYPS, HX OF 03/05/2008    Priority: Medium  . Alcohol  use/ h/o heavy abuse 08/11/2016    Priority: Low  . Blood in stool 03/05/2008    Priority: Low  . 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 Chest pain 02/11/2017  . Left shoulder pain 01/24/2015  . h/o Anemia due to blood loss, acute 11/14/2014  . Eye pain   . Upper GI bleed 11/13/2014  . Lumbar disc disease 01/30/2014  . Hearing loss 03/23/2013  . Screening for other and unspecified cardiovascular conditions 03/23/2013  . Chronic low back pain 10/14/2011  . Preventative health care 10/12/2011  . LEG CRAMPS 09/02/2010  . PERIPHERAL EDEMA 09/02/2010  . VARICOSE VEINS, LOWER EXTREMITIES 06/09/2009  . Hereditary and idiopathic peripheral neuropathy 03/05/2008  Past Medical history, Surgical history, Family history, Social history, Allergies and Medications have been entered into the medical record, reviewed and changed as needed.    Current Meds  Medication Sig  . aspirin EC 81 MG EC tablet Take 1 tablet (81 mg total) by mouth daily.  . furosemide (LASIX) 40 MG tablet Take 1 tablet (40 mg total) by mouth daily.  . metoprolol succinate (TOPROL XL) 50 MG 24 hr tablet Take 1 tablet (50 mg total) by mouth daily. Take with or immediately following a meal.  . potassium chloride 20 MEQ TBCR Take 20 mEq by mouth daily.  . pravastatin (PRAVACHOL) 40 MG tablet Take 1 tablet (40 mg total) by mouth daily.    Allergies:  Allergies  Allergen Reactions  . No Known Allergies      Review of Systems: General:   Denies fever, chills, unexplained weight loss.  Optho/Auditory:   Denies visual changes, blurred vision/LOV Respiratory:   Denies wheeze, DOE more than baseline levels.  Cardiovascular:   Denies chest pain, palpitations, new onset peripheral edema  Gastrointestinal:   Denies nausea, vomiting, diarrhea, abd pain.  Genitourinary: Denies dysuria, freq/ urgency, flank pain or discharge from genitals.  Endocrine:     Denies hot or cold intolerance, polyuria, polydipsia. Musculoskeletal:   Denies  unexplained myalgias, joint swelling, unexplained arthralgias, gait problems.  Skin:  Denies new onset rash, suspicious lesions Neurological:     Denies dizziness, unexplained weakness, numbness  Psychiatric/Behavioral:   Denies mood changes, suicidal or homicidal ideations, hallucinations    Objective:   Blood pressure (!) 146/84, pulse 70, height 5\' 11"  (1.803 m), weight 223 lb 4.8 oz (101.3 kg). Body mass index is 31.14 kg/m. General:  Well Developed, well nourished, appropriate for stated age.  Neuro:  Alert and oriented,  extra-ocular muscles intact  HEENT:  Normocephalic, atraumatic, neck supple, no carotid bruits appreciated  Skin:  no gross rash, warm, pink. Cardiac:  RRR, S1 S2 Respiratory:  ECTA B/L and A/P, Not using accessory muscles, speaking in full sentences- unlabored. Vascular:  Ext warm, no cyanosis apprec.; cap RF less 2 sec. left lower extremity 3+ pitting edema, right lower extremity 2+ pitting edema Psych:  No HI/SI, judgement and insight good, Euthymic mood. Full Affect.

## 2017-07-07 NOTE — Assessment & Plan Note (Signed)
A1c- stable; recently was 5.9 end of June - Prediabetic prudent diet discussed with patient. - Continue to monitor.

## 2017-07-07 NOTE — Assessment & Plan Note (Signed)
Patient has been noncompliant with his medications for the past 8-10 days - Told to continue her iron supplementation 2-3 times daily minimum. - Patient encouraged to eat kale and spinach which she loves also other high iron foods. - continue to monitor CBC next office visit

## 2017-07-07 NOTE — Assessment & Plan Note (Signed)
Highly encouraged to only drink 2 drinks daily - which I reviewed with patient is only 3 ounces of hard liquor\alcohol per day - Detrimental effects on blood sugar, blood pressure and cardiovascular system reviewed with patient let alone organ damage which can ensue

## 2017-07-07 NOTE — Assessment & Plan Note (Signed)
-   Encouraged regular activity  /walking program daily to goal 25 steps twice daily; incrementally increasing length of time walking as tolerated with his peripheral arterial disease - Patient has follow-up with cardiology near future.  Encouraged patient to discuss any exertional chest discomfort he is having with them.  None currently.  Patient stable - Also encouraged patient to talk with his cardiologist about any leg pains he may be having after walking a certain distance. ( PAD Sx)

## 2017-07-07 NOTE — Assessment & Plan Note (Signed)
-   Management per cardiology on pravastatin. - Lipid profile done 4 months ago.  LDL was above 70 but less than 100 - obtain CMP in 5mo-- was done 4 mo ago

## 2017-07-07 NOTE — Assessment & Plan Note (Signed)
-   Having leg symptoms after walking a certain distance.  Encouraged patient to discuss with his cardiologist next office visit- in a couple weeks.

## 2017-07-07 NOTE — Patient Instructions (Addendum)
follow-up appointment in: with Dr. Meda Coffee on 08/10/2017 at 11:20  Please get back on your iron tablets at least twice daily if you're not eating 3 times a day.  Also we discussed use starting a little bit of a walking program and every week trying to add a few more steps.  You can do 25 steps to times a day and slowly work your way up from there.  Any chest pain medicine that you think you need because of the chest pains please get in touch with your cardiologist for those.  Per pt- he was given hydrocodone- acetaminophen by cardiology in the past for his chest pains, told patient he will need to get it from them in the future if they feel it is appropriate.      xercise Guidelines During Cardiac Rehabilitation When you are recovering from a heart condition, such as from heart surgery, heart attack, or heart failure, it is important to have heart-healthy habits, including exercise routines. Discuss an appropriate exercise program with your heart specialist (cardiologist) and rehabilitation therapist. It is important to design a program that is safe and effective for you. The program should meet your specific abilities and needs. Walking, biking, jogging, and swimming are all good aerobic activities. These take light to moderate effort. Adding some light resistance training is also important. Even simple lifestyle changes can help. These lifestyle changes may include parking farther from the store or taking the stairs instead of the elevator. At first, you may begin exercising under supervision, such as at a hospital or clinic. Over time, you may begin exercising at home, with your health care provider's approval. Types of exercise Aerobic exercise During cardiac rehabilitation, it is important to do aerobic activities. Aerobic exercise keeps joints and muscles moving. It involves large muscle groups. It is also rhythmic and must be done for a longer period of time. Doing these exercises improves  circulation and endurance. Examples of aerobic exercise include:  Swimming.  Walking.  Hiking.  Jogging.  Cross-country skiing.  Biking.  Dancing.  Static exercise Static exercise (isometric exercise) uses muscles at high intensities without moving the joints. Some examples of static exercise include pushing against a heavy couch that does not move, doing a wall sit, or holding a plank position. Static exercise improves strength but also quickly increases blood pressure. Follow these guidelines:  If you have circulation problems or high blood pressure, talk with your health care provider before starting any static exercise routines. Do not do static exercises if your health care provider tells you not to.  Do not hold your breath while doing static exercises. Holding your breath during static exercises can raise your blood pressure to a dangerously high level.  Weight-resistance exercise Weight-resistance exercises are another important part of rehabilitation. These exercises strengthen your muscles by making them work against resistance. Resistance exercises may help you return to activities of daily living sooner and improve your quality of life. They also help reduce cardiac risk factors. Examples of weight-resistance exercise include using:  Free weights.  Weight-lifting machines.  Large, specially designed rubber bands.  You will usually do weight-resistance exercises 2 times a week with a 2-day rest period between workouts. Stretching Stretching before you exercise warms up your muscles and prevents injury. Stretching also improves your flexibility, balance, coordination, and range of motion. Follow these guidelines:  Stretch both before and after exercising.  Do not force a muscle or joint into a painful angle. Stretching should be a relaxing part  of your exercise routine.  Once you feel resistance in your muscle, hold the stretch for a few seconds. Make sure you keep  breathing while you hold the stretch.  Go slowly when doing all stretches.  Setting a pace  Choose a pace that is comfortable for you. ? You should be able to talk while exercising. If you are short of breath or unable to speak while you exercise, slow down. ? If you are able to sing while exercising, you are not exercising hard enough.  Keep track of how hard you are working as you exercise (exertion level). Your rehabilitation therapist can teach you to use a mental scale to measure your level of exertion (perceived exertion). Using a mental scale, you will think about your exertion level and rate it in a range from 6 to 20. ? A rating of 6 to 9. This means that you are doing "very light" exercise and are not exerting yourself enough. For a healthy person, this may be walking at a slow pace. ? A rating of 11 to 15. This is exercise that is "somewhat hard." For a healthy exercise session, you should aim for an exertion rate that is within this range. ? A rating of 16 to 17. This is considered "very hard" or strenuous. For a healthy person, exercise at this rating may start to feel heavy and difficult. ? A rating of 19 to 20. This means that you are working "extremely hard." For most people, these numbers represent the hardest you've ever worked to exercise.  Your health care provider or cardiac rehabilitation specialist may also recommend that you wear a heart rate monitor while you exercise. This will help you keep track of your heart rate zones and how hard your heart is working. Frequency As you are recovering, it is important to start exercising slowly and to gradually work up to your goal. Work with your health care provider to set up an exercise routine that works for you. Generally, cardiac rehabilitation exercise should include:  40 minutes of aerobic activity 3 - 4 days a week.  Stretching and strength exercises 2 - 3 days a week.  Contact a doctor if:  You have any of the  following symptoms while exercising: ? Pain, pressure, or burning in your chest, jaw, shoulder, or back (angina). ? Lightheadedness. ? Dizziness. ? Irregular or fast heartbeat. ? Shortness of breath.  You are extremely tired after exercising. Get help right away if:  You have angina that does not get better with medicine and lasts for more than 5 minutes.  You have nausea or you vomit.  You have excessive sweating that is not caused by exercise. Summary  When you are recovering from a heart condition, it is important to have heart-healthy habits, including exercise routines.  At first, you may begin exercising under supervision, such as at a hospital or clinic. Over time, you may begin exercising at home, with your health care provider's approval.  Aim for 40 minutes of aerobic exercises 3 - 4 days a week.  Aim to do stretching and strength exercises 2 - 3 days a week.  Choose a pace that is comfortable for you. You should be able to talk while exercising. This information is not intended to replace advice given to you by your health care provider. Make sure you discuss any questions you have with your health care provider. Document Released: 10/02/2013 Document Revised: 08/27/2016 Document Reviewed: 08/27/2016 Elsevier Interactive Patient Education  2017 [employer]  Inc[employer]

## 2017-07-07 NOTE — Assessment & Plan Note (Signed)
Well-controlled, management per cardiology - Encouraged regular exercise - Low salt diet - Medication compliance

## 2017-07-07 NOTE — Assessment & Plan Note (Signed)
-   Management per cardiology.   - Patient is not on anticoagulation due to bleeding risk being higher than stroke risk. - They recently stopped amiodarone and changed beta blocker to Toprol-XL ( was on metoprolol 25 twice a day, but patient with history of noncompliance and only had been taking it once daily)

## 2017-07-27 ENCOUNTER — Encounter: Payer: Self-pay | Admitting: Cardiology

## 2017-08-10 ENCOUNTER — Ambulatory Visit (INDEPENDENT_AMBULATORY_CARE_PROVIDER_SITE_OTHER): Payer: Medicare HMO | Admitting: Cardiology

## 2017-08-10 ENCOUNTER — Encounter: Payer: Self-pay | Admitting: Cardiology

## 2017-08-10 VITALS — BP 128/78 | HR 88 | Ht 71.0 in | Wt 222.0 lb

## 2017-08-10 DIAGNOSIS — M791 Myalgia, unspecified site: Secondary | ICD-10-CM

## 2017-08-10 DIAGNOSIS — I2510 Atherosclerotic heart disease of native coronary artery without angina pectoris: Secondary | ICD-10-CM

## 2017-08-10 DIAGNOSIS — Z951 Presence of aortocoronary bypass graft: Secondary | ICD-10-CM

## 2017-08-10 DIAGNOSIS — I480 Paroxysmal atrial fibrillation: Secondary | ICD-10-CM | POA: Diagnosis not present

## 2017-08-10 DIAGNOSIS — D649 Anemia, unspecified: Secondary | ICD-10-CM | POA: Diagnosis not present

## 2017-08-10 DIAGNOSIS — D508 Other iron deficiency anemias: Secondary | ICD-10-CM

## 2017-08-10 DIAGNOSIS — Z789 Other specified health status: Secondary | ICD-10-CM

## 2017-08-10 DIAGNOSIS — Z7289 Other problems related to lifestyle: Secondary | ICD-10-CM

## 2017-08-10 DIAGNOSIS — E782 Mixed hyperlipidemia: Secondary | ICD-10-CM

## 2017-08-10 MED ORDER — FUROSEMIDE 40 MG PO TABS: 40.0000 mg | ORAL_TABLET | Freq: Every day | ORAL | 3 refills | Status: DC

## 2017-08-10 NOTE — Progress Notes (Signed)
Cardiology Office Note    Date:  08/10/2017   ID:  Vincent Mcclure, DOB 04-18-1932, MRN [id number]  PCP:  [name], DO  Cardiologist: Dr. Meda Coffee  Chief complain: Myalgias  History of Present Illness:  Vincent Mcclure is a 83 y.o. male  with history of hypertension, HLD, gastric ulcers with big GI bleed in 2016 secondary to alcohol use and previous tobacco abuse. He was admitted with NSTEMI and was found to have multivessel CAD and preserved LV function at cardiac cath. He underwent CABG 3 02/15/17. He had postop atrial fibrillation treated with amiodarone and converted to normal sinus rhythm.CHADSVASC=4 but HSBED score is 5 so no anticoagulation at this time per Dr. Sallyanne Kuster. Bleeding risk exceeds potential stroke reduction benefit. He also had a 5% pneumothorax and acute kidney injury followed by nephrology.  I saw the patient postop 03/14/17. He had run out of metoprolol and Lasix and was not interested in cardiac rehabilitation. He had cut back on his alcohol use. I decrease his amiodarone to 200 mg once daily and resumed his Toprol Lasix and potassium. Patient saw Dr. Pia Mau and 05/12/17 and was doing well.  08/10/2017 - the patient is coming after 3 months, he feels well, denies any chest pain, DOE, no palpitations, dizziness or syncope. His anemia has improved 8.4 ->12.2 than dropped to 10.3. He was restarted on FeS and developed constipation. He has been experiencing upper chest muscle pain. He wasn't taking aspirin.   Past Medical History:  Diagnosis Date  . Alcohol abuse   . Anemia   . Chronic low back pain 10/14/2011  . Colon polyps    hyperplastic (2004, 2010) and adenomatous (1990).    . COLONIC POLYPS, HX OF 03/05/2008  . Esophageal stricture    hx of  . Gastric ulcer   . GERD 03/05/2008  . GERD (gastroesophageal reflux disease) 1994   associated peptic strictures  . HYPERLIPIDEMIA 03/05/2008  . HYPERTENSION 03/05/2008  . Hypertension   . IBS 03/05/2008  . Impaired  glucose tolerance 10/12/2011  . Iron deficiency anemia   . LEG CRAMPS 09/02/2010  . PAD (peripheral artery disease) ([facility]) 10/14/2011  . PAD (peripheral artery disease) ([facility]) 2013  . PERIPHERAL EDEMA 09/02/2010  . PERIPHERAL NEUROPATHY 03/05/2008  . Prostatitis    hx of  . VARICOSE VEINS, LOWER EXTREMITIES 06/09/2009    Past Surgical History:  Procedure Laterality Date  . CATARACT EXTRACTION  bilat  . CORONARY ARTERY BYPASS GRAFT N/A 02/15/2017   Procedure: CORONARY ARTERY BYPASS GRAFTING times three  with left internal mammary harvest and endoscopic harvest of Right SVG. Grafts of LIMA to  LAD, SVG to Distal Circ, and to First Diag.;  Surgeon: Grace Isaac, MD;  Location: [facility];  Service: Open Heart Surgery;  Laterality: N/A;  . ESOPHAGOGASTRODUODENOSCOPY N/A 11/14/2014   Procedure: ESOPHAGOGASTRODUODENOSCOPY (EGD);  Surgeon: Jerene Bears, MD;  Location: [facility] ENDOSCOPY;  Service: Endoscopy;  Laterality: N/A;  . LEFT HEART CATH AND CORONARY ANGIOGRAPHY N/A 02/14/2017   Procedure: Left Heart Cath and Coronary Angiography;  Surgeon: Belva Crome, MD;  Location: [facility];  Service: Cardiovascular;  Laterality: N/A;  . LUMBAR SPINE SURGERY  11/2008   Dr Joya Salm  . TEE WITHOUT CARDIOVERSION N/A 02/15/2017   Procedure: TRANSESOPHAGEAL ECHOCARDIOGRAM (TEE);  Surgeon: Grace Isaac, MD;  Location: [facility];  Service: Open Heart Surgery;  Laterality: N/A;    Current Medications: Current Meds  Medication Sig  . aspirin EC 81  MG EC tablet Take 1 tablet (81 mg total) by mouth daily.  . ferrous sulfate 325 (65 FE) MG EC tablet Take 1 tablet (325 mg total) by mouth 3 (three) times daily with meals.  . metoprolol succinate (TOPROL XL) 50 MG 24 hr tablet Take 1 tablet (50 mg total) by mouth daily. Take with or immediately following a meal.  . pravastatin (PRAVACHOL) 40 MG tablet Take 1 tablet (40 mg total) by mouth daily.     Allergies:   No known allergies   Social History   Social History    . Marital status: Married    Spouse name: N/A  . Number of children: 4  . Years of education: N/A   Occupational History  . Retired Engineer, maintenance    Social History Main Topics  . Smoking status: Former Smoker    Packs/day: 1.25    Years: 50.00    Types: Cigarettes    Quit date: 11/11/2008  . Smokeless tobacco: Former Systems developer    Types: Chew  . Alcohol use 3.6 oz/week    2 Glasses of wine, 4 Cans of beer per week  . Drug use: No  . Sexual activity: No   Other Topics Concern  . None   Social History Narrative   ** Merged History Encounter **       4 sons     Family History:  The patient's family history includes Cancer in his mother; Diabetes in his father and sister; Heart disease in his father; Hypertension in his father; Stroke in his father.   ROS:   Please see the history of present illness.    Review of Systems  Constitution: Positive for weakness.  HENT: Negative.   Cardiovascular: Positive for leg swelling.  Respiratory: Negative.   Endocrine: Negative.   Hematologic/Lymphatic: Negative.   Musculoskeletal: Positive for myalgias.  Gastrointestinal: Negative.   Genitourinary: Negative.    All other systems reviewed and are negative.   PHYSICAL EXAM:   VS:  BP 128/78   Pulse 88   Ht 5\' 11"  (1.803 m)   Wt 222 lb (100.7 kg) Comment: 212lbs at home  SpO2 98%   BMI 30.96 kg/m   Physical Exam  GEN: Well nourished, well developed, in no acute distress  Neck: no JVD, carotid bruits, or masses Cardiac:RRR; Positive S4 no murmurs, rubs Respiratory:  clear to auscultation bilaterally, normal work of breathing GI: soft, nontender, nondistended, + BS Ext: +1 edema bilaterally without cyanosis, clubbing.Good distal pulses bilaterally Neuro:  Alert and Oriented x 3,  Psych: euthymic mood, full affect  Wt Readings from Last 3 Encounters:  08/10/17 222 lb (100.7 kg)  07/07/17 223 lb 4.8 oz (101.3 kg)  05/24/17 209 lb (94.8 kg)      Studies/Labs Reviewed:    EKG:  EKG is ordered today.  The ekg ordered today demonstrates Normal sinus rhythm with first-degree AV block  Recent Labs: 08/11/2016: TSH 1.10 02/16/2017: Magnesium 2.3 02/20/2017: ALT 13 05/24/2017: BUN 15; Creatinine, Ser 1.10; Platelets 254; Potassium 4.7; Sodium 139 07/07/2017: Hemoglobin 10.3   Lipid Panel    Component Value Date/Time   CHOL 145 02/12/2017 0657   TRIG 83 02/12/2017 0657   HDL 43 02/12/2017 0657   CHOLHDL 3.4 02/12/2017 0657   VLDL 17 02/12/2017 0657   LDLCALC 85 02/12/2017 0657   LDLDIRECT 133.2 10/14/2011 1106    Additional studies/ records that were reviewed today include:    2-D echo 02/14/17 Study Conclusions   - Left  ventricle: The cavity size was mildly dilated. Systolic   function was normal. The estimated ejection fraction was in the   range of 55% to 60%. Wall motion was normal; there were no   regional wall motion abnormalities. Left ventricular diastolic   function parameters were normal. - Aortic valve: Poorly visualized. Trileaflet; mildly thickened,   mildly calcified leaflets LA normal in size  SURGICAL PROCEDURE:  Coronary artery bypass grafting x3 with left internal mammary to the left anterior descending coronary artery, reverse saphenous vein graft to the diagonal coronary artery, reverse saphenous vein graft to the distal circumflex coronary artery with right thigh greater saphenous endovein harvesting       Conclusion   Heavily calcified left main and proximal to mid LAD.  Eccentric 75% distal left main  Mid LAD [place] 0, 1, 1 bifurcation stenosis with 90% LAD and 75% second diagonal.  Subtotal occlusion, 99%, of the moderate sized first diagonal, possibly the source of the patient's presentation.  Ostial 80% circumflex. The distal circumflex is relatively small with 2 small to intermediate size branches distally.  Mid to distal 70% PDA. The right coronary is dominant and otherwise widely patent.  Mild hypokinesis  mid anterior wall, likely due to occlusion of the first diagonal. Overall LV function is normal with EF 55%. Normal left ventricular filling pressures.   Recommendations:    Surgical consultation via TCTS for consideration of bypass surgery due to left main coronary disease and complex mid LAD stenoses        ASSESSMENT:    1. Myalgia   2. Anemia, unspecified type   3. CAD in native artery   4. S/P CABG x 3   5. Mixed hyperlipidemia   6. Alcohol use/ h/o heavy abuse   7. PAF (paroxysmal atrial fibrillation) ([place])   8. Other iron deficiency anemia      PLAN:  In order of problems listed above:  1. CAD status post CABG 02/15/17 Doing well 6 months postop. Continue toprol XL, pravastatin, restart aspirin.  2. PAF postop maintaining normal sinus rhythm, now off amiodarone.  3. Essential hypertension - controlled  4. Mixed hyperlipidemia - continue pravastatin, check CPK, he was taking pravastatin even prior to CABG  5. Anemia with hemoglobin of 8.4 in June ->12.2->10.3, recheck Hb Today as he has constipation from FeS.  6. History of heavy alcohol use patient is drinking a low more than he had been last time I saw him. I've asked him to limit his alcohol intake.  Medication Adjustments/Labs and Tests Ordered: Current medicines are reviewed at length with the patient today.  Concerns regarding medicines are outlined above.  Medication changes, Labs and Tests ordered today are listed in the Patient Instructions below. Patient Instructions  Medication Instructions:  Your physician recommends that you continue on your current medications as directed. Please refer to the Current Medication list given to you today.  Labwork: Today for BMET, complete blood count, liver, CPK and lipids   Testing/Procedures: None ordered   Follow-Up: Your physician recommends that you schedule a follow-up appointment in: 3 months with Dr. Meda Coffee    Any Other Special Instructions Will Be Listed  Below (If Applicable).     If you need a refill on your cardiac medications before your next appointment, please call your pharmacy.     Signed, Ena Dawley, MD  08/10/2017 12:08 PM    [address], [place], [place]  [address] Phone: [phone]; Fax: [phone]    

## 2017-08-10 NOTE — Patient Instructions (Addendum)
Medication Instructions:  Your physician recommends that you continue on your current medications as directed. Please refer to the Current Medication list given to you today.  Labwork: Today for BMET, complete blood count, liver, CPK and lipids   Testing/Procedures: None ordered   Follow-Up: Your physician recommends that you schedule a follow-up appointment in: 3 months with Dr. Meda Coffee    Any Other Special Instructions Will Be Listed Below (If Applicable).     If you need a refill on your cardiac medications before your next appointment, please call your pharmacy.

## 2017-08-11 LAB — HEPATIC FUNCTION PANEL
ALT: 12 IU/L (ref 0–44)
AST: 13 IU/L (ref 0–40)
Albumin: 4.4 g/dL (ref 3.5–4.7)
Alkaline Phosphatase: 102 IU/L (ref 39–117)
Bilirubin Total: 0.4 mg/dL (ref 0.0–1.2)
Bilirubin, Direct: 0.18 mg/dL (ref 0.00–0.40)
Total Protein: 6.5 g/dL (ref 6.0–8.5)

## 2017-08-11 LAB — CK: Total CK: 44 U/L (ref 24–204)

## 2017-08-11 LAB — CBC
Hematocrit: 39.8 % (ref 37.5–51.0)
Hemoglobin: 13.2 g/dL (ref 13.0–17.7)
MCH: 28.5 pg (ref 26.6–33.0)
MCHC: 33.2 g/dL (ref 31.5–35.7)
MCV: 86 fL (ref 79–97)
Platelets: 229 10*3/uL (ref 150–379)
RBC: 4.63 x10E6/uL (ref 4.14–5.80)
RDW: 15.2 % (ref 12.3–15.4)
WBC: 4 10*3/uL (ref 3.4–10.8)

## 2017-08-11 LAB — LIPID PANEL
Chol/HDL Ratio: 2.8 ratio (ref 0.0–5.0)
Cholesterol, Total: 143 mg/dL (ref 100–199)
HDL: 52 mg/dL (ref >39)
LDL Calculated: 71 mg/dL (ref 0–99)
Triglycerides: 100 mg/dL (ref 0–149)
VLDL Cholesterol Cal: 20 mg/dL (ref 5–40)

## 2017-08-11 LAB — BASIC METABOLIC PANEL
BUN/Creatinine Ratio: 16 (ref 10–24)
BUN: 15 mg/dL (ref 8–27)
CO2: 22 mmol/L (ref 20–29)
Calcium: 9.4 mg/dL (ref 8.6–10.2)
Chloride: 99 mmol/L (ref 96–106)
Creatinine, Ser: 0.94 mg/dL (ref 0.76–1.27)
GFR calc Af Amer: 86 mL/min/{1.73_m2} (ref >59)
GFR calc non Af Amer: 75 mL/min/{1.73_m2} (ref >59)
Glucose: 79 mg/dL (ref 65–99)
Potassium: 4.4 mmol/L (ref 3.5–5.2)
Sodium: 138 mmol/L (ref 134–144)

## 2017-09-14 ENCOUNTER — Ambulatory Visit: Payer: Medicare HMO

## 2017-09-14 ENCOUNTER — Encounter: Payer: Self-pay | Admitting: Family Medicine

## 2017-09-14 ENCOUNTER — Ambulatory Visit (INDEPENDENT_AMBULATORY_CARE_PROVIDER_SITE_OTHER): Payer: Medicare HMO | Admitting: Family Medicine

## 2017-09-14 VITALS — BP 120/67 | HR 96 | Temp 98.1°F | Ht 71.0 in | Wt 216.2 lb

## 2017-09-14 DIAGNOSIS — R058 Other specified cough: Secondary | ICD-10-CM

## 2017-09-14 DIAGNOSIS — R05 Cough: Secondary | ICD-10-CM

## 2017-09-14 DIAGNOSIS — J181 Lobar pneumonia, unspecified organism: Secondary | ICD-10-CM

## 2017-09-14 DIAGNOSIS — J189 Pneumonia, unspecified organism: Secondary | ICD-10-CM

## 2017-09-14 DIAGNOSIS — R0602 Shortness of breath: Secondary | ICD-10-CM | POA: Diagnosis not present

## 2017-09-14 MED ORDER — PREDNISONE 10 MG (21) PO TBPK: ORAL_TABLET | ORAL | 0 refills | Status: DC

## 2017-09-14 MED ORDER — MOXIFLOXACIN HCL 400 MG PO TABS: 400.0000 mg | ORAL_TABLET | Freq: Every day | ORAL | 0 refills | Status: AC

## 2017-09-14 MED ORDER — ALBUTEROL SULFATE HFA 108 (90 BASE) MCG/ACT IN AERS: 1.0000 | INHALATION_SPRAY | RESPIRATORY_TRACT | 2 refills | Status: DC | PRN

## 2017-09-14 NOTE — Progress Notes (Signed)
Acute Care Office visit    Assessment and plan:  1. Shortness of breath   2. Productive cough   3. Community acquired pneumonia of left lower lobe of lung ([place])    - Patient declines to go to the emergency room for evaluation of his significant dyspnea on exertion along with shoulder pain he gets with exertion.  Recently had CABG in May of this year and understands this could be his heart.  He states he is too busy and does not want to go to the emergency room.  AMA papers signed.  Patient agrees that if he gets any more of that shoulder pain with exertion that is relieved with rest he will dial 911 or go to the emergency room for further evaluation since he declines today.  -For treatment of his pneumonia we will give him antibiotics, and inhaler for the shortness of breath to use as needed and a small steroid Dosepak taper.   - He will follow-up with me Monday of next week is when I am next in the office to see how he is doing.    Meds ordered this encounter  Medications  . moxifloxacin (AVELOX) 400 MG tablet    Sig: Take 1 tablet (400 mg total) by mouth daily for 10 days.    Dispense:  10 tablet    Refill:  0  . albuterol (PROVENTIL HFA;VENTOLIN HFA) 108 (90 Base) MCG/ACT inhaler    Sig: Inhale 1-2 puffs into the lungs every 4 (four) hours as needed for wheezing or shortness of breath.    Dispense:  1 Inhaler    Refill:  2  . predniSONE (STERAPRED UNI-PAK 21 TAB) 10 MG (21) TBPK tablet    Sig: 12 day taper pack, use as directed    Dispense:  1 tablet    Refill:  0     Education and routine counseling performed. Handouts provided.  Orders Placed This Encounter  Procedures  . DG Chest 2 View    Anticipatory guidance and routine counseling done re: condition, txmnt options and need for follow up. All questions of patient's were answered.   Gross side effects, risk and benefits, and alternatives of medications discussed with patient.  Patient is aware that all  medications have potential side effects and we are unable to predict every sideeffect or drug-drug interaction that may occur.  Expresses verbal understanding and consents to current therapy plan and treatment regiment.  Return for Follow-up with me this Monday, the 10th for reck PNA.  Please see AVS handed out to patient at the end of our visit for additional patient instructions/ counseling done pertaining to today's office visit.  Note: This document was prepared using Dragon voice recognition software and may include unintentional dictation errors.    Subjective:    Chief Complaint  Patient presents with  . Cough    productive cough x 1 wk     HPI:  Pt presents with Sx for 7 days; patient status post three-vessel CABG in May of this year   C/o: Patient complains of productive cough and worsening shortness of breath for approximately 1 week.  Now worse.  He did have a couple days of fever and chills initially and not sure he has been having them recently.  He was extremely dyspneic in the office and we ambulated him for approximately 10 yards with him becoming tachycardic and very tachypneic.  His pulse ox did not drop below 94-95% and his heart rate was  over 110-115 with slow ambulation.  Towards the end of his walk he did also complain of his shoulders aching him and hurting him.  He states when he exerts himself he occasionally gets this.  He did initially tell me it was similar to when he had his heart problems  Denies:  objective F/C,   No face pain or ear pain,   No N/V/D,   No SOB/DIB, no Cough or Pleuritic CP,  No Rash.    For symptoms patient has tried:  nothing  Overall getting:   W  Past medical history, Surgical history, Family history reviewed and noted below, Social history, Allergies, and Medications have been entered into the medical record, reviewed and changed as needed.   Allergies  Allergen Reactions  . No Known Allergies     Review of Systems: General:    No F/C, wt loss Pulm:   No DIB, pleuritic chest pain Card:  No CP, palpitations Abd:  No n/v/d or pain Ext:  No inc edema from baseline   Objective:   Blood pressure 120/67, pulse 96, temperature 98.1 F (36.7 C), height 5\' 11"  (1.803 m), weight 216 lb 3.2 oz (98.1 kg), SpO2 99 %. Body mass index is 30.15 kg/m.   General: Well Developed, well nourished, appropriate for stated age.  Appears in acute distress with any exertion.  Easily becomes very dyspneic and tachycardic when moving from chair to exam table. Neuro: Alert and oriented x3, extra-ocular muscles intact, sensation grossly intact.  HEENT: Normocephalic, atraumatic, pupils equal round reactive to light, neck supple, no masses, no painful lymphadenopathy, TM's intact B/L, no acute findings. Nares- patent, clear d/c, OP- clear, mild erythema, No TTP sinuses Skin: Warm and dry, no gross rash. Cardiac: RRR, S1 S2,  no murmurs rubs or gallops.  Respiratory: Left lung lower fields with dec aeration, increased congestion, rhonchi and rales.  Anterior less than posterior + using accessory muscles, speaking in full sentences- unlabored. Vascular:  No gross lower ext edema, cap RF less 2 sec. Psych: No HI/SI, judgement and insight good, Euthymic mood. Full Affect.  -EKG obtained in the office today essentially unchanged from prior which was done in August 2018-which was done at his cardiologist's office by Estella Husk, PA-C.     Patient Care Team    Relationship Specialty Notifications Start End  [name], DO PCP - General Family Medicine  09/29/16   Pyrtle, Lajuan Lines, MD Consulting Physician Gastroenterology  09/29/16   Allyn Kenner, MD Consulting Physician Dermatology  09/29/16   Dorothy Spark, MD Consulting Physician Cardiology  04/06/17   Levin Erp, [place] Physician Assistant Gastroenterology  07/07/17   Imogene Burn, PA-C Physician Assistant Cardiology  07/07/17

## 2017-09-14 NOTE — Patient Instructions (Addendum)
Patient declines to go to the emergency room for evaluation of his significant dyspnea on exertion along with shoulder pain he gets with exertion.  Recently had CABG in May of this year and understands this could be his heart.  He states he is too busy and does not want to go to the emergency room.  AMA papers signed.  Patient agrees that if he gets any more of that shoulder pain with exertion that is relieved with rest he will dial 911 or go to the emergency room for further evaluation since he declines today.  -For treatment of his pneumonia we will give him antibiotics, and inhaler for the shortness of breath to use as needed and a small steroid Dosepak taper.  He will follow-up with me Monday of next week is when I am next in the office to see how he is doing.

## 2017-09-19 ENCOUNTER — Ambulatory Visit: Payer: Medicare HMO | Admitting: Family Medicine

## 2017-09-22 ENCOUNTER — Telehealth: Payer: Self-pay | Admitting: Family Medicine

## 2017-09-22 NOTE — Telephone Encounter (Signed)
Patient came by office with BIG Compliment for Dr. Raliegh Scarlet (His Healer) -- Patient states she is a wonderful doctor and the medicine she gave him for his Respiratory infection cleared up in 3 days--- he is feeling & doing wonderful thanks to her---- Patient request we be sure and left Dr. Raliegh Scarlet know he was her healeeeee as she was his true Healer :-))))) --glh

## 2017-10-31 ENCOUNTER — Other Ambulatory Visit: Payer: Self-pay | Admitting: Internal Medicine

## 2017-11-21 ENCOUNTER — Ambulatory Visit: Payer: Medicare HMO | Admitting: Cardiology

## 2017-11-30 ENCOUNTER — Encounter: Payer: Self-pay | Admitting: Cardiology

## 2017-12-20 DIAGNOSIS — L570 Actinic keratosis: Secondary | ICD-10-CM | POA: Diagnosis not present

## 2017-12-20 DIAGNOSIS — X32XXXD Exposure to sunlight, subsequent encounter: Secondary | ICD-10-CM | POA: Diagnosis not present

## 2018-01-09 ENCOUNTER — Telehealth: Payer: Self-pay | Admitting: Family Medicine

## 2018-01-09 ENCOUNTER — Other Ambulatory Visit: Payer: Self-pay | Admitting: Internal Medicine

## 2018-01-09 ENCOUNTER — Other Ambulatory Visit: Payer: Self-pay

## 2018-01-09 NOTE — Telephone Encounter (Signed)
Patient is requesting a refill of his pravastatin 40 mg, if approved please send to [facility]

## 2018-01-09 NOTE — Telephone Encounter (Signed)
Medication was last filled by his internal doctor. Request sent to provider to review. MPulliam, CMA/RT(R)

## 2018-01-09 NOTE — Telephone Encounter (Signed)
Error in last message. I wrote [facility] as the pharmacy, but the pravastatin should go to [facility] on [address]

## 2018-01-09 NOTE — Telephone Encounter (Signed)
Noted. Medication was last filled by his internal doctor. Request sent to provider to review. MPulliam, CMA/RT(R)

## 2018-01-09 NOTE — Telephone Encounter (Signed)
Patient came in requesting a refill on his Provastatin, reviewed patient's chart.  Medication was last filled by Cathlean Cower, MD on 08/11/2016 (internal medicine) for #90 with 3 refills. Patient has contacted there office on 11/08/17 for refills and it was denied due to patient needed an office visit with them. Patient was last seen in our office for acute visit on 09/14/2017 and was advised by provider to follow up in 5 days which patient did not. Patient was last seen in office for chronic care on 07/07/2017 and was advised by provider at that time to follow up in 4 months around 11/06/2017 for chronic issues, patient did not follow up as advised. Medication request sent to Dr. Raliegh Scarlet for review. MPulliam, CMA/RT(R)

## 2018-01-10 ENCOUNTER — Other Ambulatory Visit: Payer: Self-pay | Admitting: Adult Health

## 2018-01-10 MED ORDER — PRAVASTATIN SODIUM 40 MG PO TABS: 40.0000 mg | ORAL_TABLET | Freq: Every day | ORAL | 0 refills | Status: DC

## 2018-01-10 NOTE — Telephone Encounter (Signed)
30 day refill sent in. He will need OV with PCP/Dr. Raliegh Scarlet prior to any additional RFs.

## 2018-01-10 NOTE — Telephone Encounter (Signed)
Vincent Mcclure, Please review while Dr. Raliegh Scarlet is out of the office.  Thank you.  MPulliam, CMA/RT(R)

## 2018-01-11 ENCOUNTER — Telehealth: Payer: Self-pay | Admitting: Family Medicine

## 2018-01-11 NOTE — Telephone Encounter (Signed)
Left Patient message to call office to set up appt.--- Provider required OV for Rx refills.  --glh

## 2018-01-17 ENCOUNTER — Encounter: Payer: Self-pay | Admitting: Family Medicine

## 2018-01-17 ENCOUNTER — Ambulatory Visit (INDEPENDENT_AMBULATORY_CARE_PROVIDER_SITE_OTHER): Payer: Medicare HMO | Admitting: Family Medicine

## 2018-01-17 VITALS — BP 139/77 | HR 92 | Ht 71.0 in | Wt 220.8 lb

## 2018-01-17 DIAGNOSIS — I10 Essential (primary) hypertension: Secondary | ICD-10-CM

## 2018-01-17 DIAGNOSIS — Z951 Presence of aortocoronary bypass graft: Secondary | ICD-10-CM

## 2018-01-17 DIAGNOSIS — D508 Other iron deficiency anemias: Secondary | ICD-10-CM

## 2018-01-17 DIAGNOSIS — Z789 Other specified health status: Secondary | ICD-10-CM

## 2018-01-17 DIAGNOSIS — R7302 Impaired glucose tolerance (oral): Secondary | ICD-10-CM | POA: Diagnosis not present

## 2018-01-17 DIAGNOSIS — R252 Cramp and spasm: Secondary | ICD-10-CM | POA: Diagnosis not present

## 2018-01-17 DIAGNOSIS — K921 Melena: Secondary | ICD-10-CM | POA: Diagnosis not present

## 2018-01-17 DIAGNOSIS — I25810 Atherosclerosis of coronary artery bypass graft(s) without angina pectoris: Secondary | ICD-10-CM

## 2018-01-17 DIAGNOSIS — D509 Iron deficiency anemia, unspecified: Secondary | ICD-10-CM | POA: Diagnosis not present

## 2018-01-17 DIAGNOSIS — E782 Mixed hyperlipidemia: Secondary | ICD-10-CM

## 2018-01-17 DIAGNOSIS — Z7289 Other problems related to lifestyle: Secondary | ICD-10-CM

## 2018-01-17 DIAGNOSIS — D62 Acute posthemorrhagic anemia: Secondary | ICD-10-CM | POA: Diagnosis not present

## 2018-01-17 MED ORDER — FERROUS SULFATE 325 (65 FE) MG PO TBEC: 325.0000 mg | DELAYED_RELEASE_TABLET | Freq: Three times a day (TID) | ORAL | 11 refills | Status: DC

## 2018-01-17 MED ORDER — PRAVASTATIN SODIUM 40 MG PO TABS: ORAL_TABLET | ORAL | 1 refills | Status: DC

## 2018-01-17 NOTE — Patient Instructions (Addendum)
Please make sure you are taking your iron supplements 2-3 times daily.  Very important that you take this to help with your iron stores or else he will feel very fatigued and tired.  -Please use the Hemoccult cards to check for blood in your stool.  My CMA Lenna Sciara will discuss this with you and bring them back at your convenience.  -Please follow-up with Dr. Ottie Glazier, your cardiologist as per her recommendations.  You are supposed to see her in 3 months, this was told to you during your last office visit with her in October 2018.  Please call for follow-up appointment.

## 2018-01-17 NOTE — Progress Notes (Signed)
Impression and Recommendations:    1. Essential hypertension   2. Mixed hyperlipidemia   3. Glucose intolerance (impaired glucose tolerance)   4. Coronary artery disease involving coronary bypass graft of native heart without angina pectoris   5. Gastrointestinal hemorrhage with melena   6. Other iron deficiency anemia   7. Alcohol use/ h/o heavy abuse   8. S/P CABG x 3   9. LEG CRAMPS    1. HTN (hypertension) - Well-controlled, management per cardiology - Continue medications as prescribed. - No changes made today. - Encouraged regular exercise, as much as he can tolerate. - Low salt diet  2. HLD (hyperlipidemia) - Management per cardiology on pravastatin.   - Continue medications as prescribed. - Lipid profile done 5 months ago 08/10/2017.  LDL was above 70 (71) but less than 100  3. Glucose intolerance (impaired glucose tolerance) A1c- stable; recently was 5.9 end of June 2018. - Prediabetic prudent diet discussed with patient. - Continue to monitor.  4. CAD s/p NSTEMI (non-ST elevated myocardial infarction); with subsequent urgent CABG  5/18 ([place]) - Heart in normal rhythm today  - Advised patient to continue following up with cardiology. - Continues taking baby aspirin.  - Encouraged regular activity  /walking program daily to goal 25 steps twice daily; incrementally increasing length of time walking as tolerated with his peripheral arterial disease  - Patient has follow-up with cardiology near future.  Encouraged patient to discuss any exertional chest discomfort he is having with them.  None currently.  Patient stable.  - Also encouraged patient to talk with his cardiologist about any leg pains he may be having after walking a certain distance. ( PAD Sx)   - Advised patient not to use the albuterol inhaler if he ever feels short of breath, because now that could be an indication of something going on with his heart.  PAF (paroxysmal atrial fibrillation)  ([place]) - Management per cardiology.   - Patient is not on anticoagulation due to bleeding risk being higher than stroke risk. - Last stopped amiodarone and changed beta blocker to Toprol-XL. - Continues on Toprol-XL (was on metoprolol 25 twice a day, but patient with history of noncompliance and only had been taking it once daily)   PAD (peripheral artery disease) ([place]) - Having leg symptoms after walking a certain distance.  Encouraged patient to discuss with his cardiologist next office visit.  5. Anemia, iron deficiency - Patient has been taking his iron supplements consistently, once to twice per day, but not 3 times per day. - patient will continue on these supplements.  - Told to continue iron supplementation 2-3 times daily minimum. - Patient encouraged to eat kale and spinach which he loves, also other high iron foods. - Continue to monitor CBC next office visit  - Stool cards given for patient's use at home.  6. Alcohol use/ h/o heavy abuse - Highly encouraged to only drink 2 drinks daily - which I reviewed with patient is only 3 ounces of hard liquor\alcohol per day.  - Detrimental effects on blood sugar, blood pressure and cardiovascular system reviewed with patient let alone organ damage which can ensue.  The patient was counseled, risk factors were discussed, anticipatory guidance given.  7. General Health Maintenance - Advised patient to treat his occasional neck muscle cramps with moist heat.  - Advised patient to continue working toward exercising to improve health.    - Patient may begin with 15 minutes of activity daily.  Recommended that the patient eventually strive for at least 150 minutes of cardio per week according to the [facility].   - Healthy dietary habits encouraged, including low-carb, and high amounts of lean protein in diet.   - Patient should also consume adequate amounts of water - half of body weight in oz of water per day, more if he continues to  consume alcohol in significant quantities.  The patient was counseled, risk factors were discussed, anticipatory guidance given.   New Prescriptions   No medications on file    Discontinued Medications   ALBUTEROL (PROVENTIL HFA;VENTOLIN HFA) 108 (90 BASE) MCG/ACT INHALER    Inhale 1-2 puffs into the lungs every 4 (four) hours as needed for wheezing or shortness of breath.   POTASSIUM CHLORIDE 20 MEQ TBCR    Take 20 mEq by mouth daily.   PREDNISONE (STERAPRED UNI-PAK 21 TAB) 10 MG (21) TBPK TABLET    12 day taper pack, use as directed    Modified Medications   Modified Medication Previous Medication   FERROUS SULFATE 325 (65 FE) MG EC TABLET ferrous sulfate 325 (65 FE) MG EC tablet      Take 1 tablet (325 mg total) by mouth 3 (three) times daily with meals.    Take 1 tablet (325 mg total) by mouth 3 (three) times daily with meals.   PRAVASTATIN (PRAVACHOL) 40 MG TABLET pravastatin (PRAVACHOL) 40 MG tablet      I po q hs    Take 1 tablet (40 mg total) by mouth daily.    Orders Placed This Encounter  Procedures  . Vitamin B12  . Folate  . Iron and TIBC  . Ferritin  . Magnesium  . Phosphorus  . CBC with Differential/Platelet  . Comprehensive metabolic panel  . Hemoglobin A1c    Meds ordered this encounter  Medications  . pravastatin (PRAVACHOL) 40 MG tablet    Sig: I po q hs    Dispense:  90 tablet    Refill:  1  . ferrous sulfate 325 (65 FE) MG EC tablet    Sig: Take 1 tablet (325 mg total) by mouth 3 (three) times daily with meals.    Dispense:  90 tablet    Refill:  11    Gross side effects, risk and benefits, and alternatives of medications and treatment plan in general discussed with patient.  Patient is aware that all medications have potential side effects and we are unable to predict every side effect or drug-drug interaction that may occur.   Patient will call with any questions prior to using medication if they have concerns.  Expresses verbal understanding and  consents to current therapy and treatment regimen.  No barriers to understanding were identified.  Red flag symptoms and signs discussed in detail.  Patient expressed understanding regarding what to do in case of emergency\urgent symptoms  Please see AVS handed out to patient at the end of our visit for further patient instructions/ counseling done pertaining to today's office visit.   Return in about 4 months (around 05/19/2018) for Hypertension, Pre-DM follow up every 4 mo.    Note: This note was prepared with assistance of Dragon voice recognition software. Occasional wrong-word or sound-a-like substitutions may have occurred due to the inherent limitations of voice recognition software.   This document serves as a record of services personally performed by [name], DO. It was created on her behalf by Toni Amend, a trained medical scribe. The creation of this record is  based on the scribe's personal observations and the provider's statements to them.   I have reviewed the above medical documentation for accuracy and completeness and I concur.  [name] 01/17/18 6:12 PM   ------------------------------------------------------------------------------------------------------------------------------------------------------------    Subjective:     HPI: Vincent Mcclure is a 84 y.o. male who presents to [place] at [facility] today for issues as discussed below.  Anemia Comments about his recent anemia diagnosis.  Patient has been taking his iron supplements consistently, once to twice per day, but not 3 times per day, since his last visit in October 2018 (six months ago).  Patient believes his constipation is mainly due to his diet and inadequate hydration.  Cardiology No new chest pain, no new SOB, no new swelling in his legs.  Denies other cardiac symptoms. However, has been having some muscle pains around his neck and shoulders and feeling  weakness.  He doesn't get SOB when he lies flat at night; has no trouble lying flat at night. Doesn't get short of breath very often.  Does not use his albuterol inhaler regularly when he feels SOB.  Dr. Ena Dawley is his ongoing cardiologist.  His last visit there was in October 2018.  His main concern is with his Pravastatin prescription.  He ran out of it about 2 weeks ago and notes that he immediately put on 10 lbs.  The heart doctor told him that he needs to make sure he keeps up with his Pravachol and metoprolol prescriptions.  Takes lasix every once in a while, but not every day. Sometimes he takes it 2-3 times per week, sometimes he takes it once every few weeks.  Notes he knows to take it when his legs swell up a little bit, or feels swelling in his stomach. If he's moving around and feeling agile, he's okay, but notes that this doesn't always happen.  HTN At home, he checks his blood pressure occasionally.  Notes he hasn't worried about it since his heart attack.  Occasional Neck Cramps Sometimes when he looks over his right shoulder, he gets a muscle cramp.  Notes that when he feels this cramp, he tries to massage it out.  Alcohol Use Continues enjoying alcohol.  Likes drinking beer and vodka.  Physical Activity Patient notes that he cannot walk as much as he would like due to pain in his knees.   Wt Readings from Last 3 Encounters:  01/17/18 220 lb 12.8 oz (100.2 kg)  09/14/17 216 lb 3.2 oz (98.1 kg)  08/10/17 222 lb (100.7 kg)   BP Readings from Last 3 Encounters:  01/17/18 139/77  09/14/17 120/67  08/10/17 128/78   Pulse Readings from Last 3 Encounters:  01/17/18 92  09/14/17 96  08/10/17 88   BMI Readings from Last 3 Encounters:  01/17/18 30.80 kg/m  09/14/17 30.15 kg/m  08/10/17 30.96 kg/m     Patient Care Team    Relationship Specialty Notifications Start End  [name], DO PCP - General Family Medicine  09/29/16   Pyrtle, Lajuan Lines, MD  Consulting Physician Gastroenterology  09/29/16   Allyn Kenner, MD Consulting Physician Dermatology  09/29/16   Dorothy Spark, MD Consulting Physician Cardiology  04/06/17   Levin Erp, [place] Physician Assistant Gastroenterology  07/07/17   Murrell Converse Physician Assistant Cardiology  07/07/17      Patient Active Problem List   Diagnosis Date Noted  . CAD (coronary artery disease) of artery bypass graft  03/14/2017    Priority: High  . PAF (paroxysmal atrial fibrillation) ([name])     Priority: High  . s/p NSTEMI (non-ST elevated myocardial infarction); with subsequent urgent CABG  5/18 ([place]) 02/12/2017    Priority: High  . PAD (peripheral artery disease) ([place]) 10/14/2011    Priority: High  . Glucose intolerance (impaired glucose tolerance) 10/12/2011    Priority: High  . HLD (hyperlipidemia) 03/05/2008    Priority: High  . HTN (hypertension) 03/05/2008    Priority: High  . Overweight (BMI 25.0-29.9) 09/29/2016    Priority: Medium  . GI bleed 11/14/2014    Priority: Medium  . Acute esophagitis     Priority: Medium  . Acute gastric ulcer     Priority: Medium  . Duodenal ulcer disease     Priority: Medium  . Anemia, iron deficiency 03/05/2008    Priority: Medium  . GERD 03/05/2008    Priority: Medium  . IBS 03/05/2008    Priority: Medium  . COLONIC POLYPS, HX OF 03/05/2008    Priority: Medium  . Alcohol use/ h/o heavy abuse 08/11/2016    Priority: Low  . Blood in stool 03/05/2008    Priority: Low  . 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 Chest pain 02/11/2017  . Left shoulder pain 01/24/2015  . h/o Anemia due to blood loss, acute 11/14/2014  . Eye pain   . Upper GI bleed 11/13/2014  . Lumbar disc disease 01/30/2014  . Hearing loss 03/23/2013  . Screening for other and unspecified cardiovascular conditions 03/23/2013  . Chronic low back pain 10/14/2011  . Preventative health care 10/12/2011  . LEG CRAMPS 09/02/2010  . PERIPHERAL EDEMA 09/02/2010  . VARICOSE  VEINS, LOWER EXTREMITIES 06/09/2009  . Hereditary and idiopathic peripheral neuropathy 03/05/2008    Past Medical history, Surgical history, Family history, Social history, Allergies and Medications have been entered into the medical record, reviewed and changed as needed.    Current Meds  Medication Sig  . aspirin EC 81 MG EC tablet Take 1 tablet (81 mg total) by mouth daily.  . ferrous sulfate 325 (65 FE) MG EC tablet Take 1 tablet (325 mg total) by mouth 3 (three) times daily with meals.  . furosemide (LASIX) 40 MG tablet Take 1 tablet (40 mg total) by mouth daily.  . metoprolol succinate (TOPROL XL) 50 MG 24 hr tablet Take 1 tablet (50 mg total) by mouth daily. Take with or immediately following a meal.  . pravastatin (PRAVACHOL) 40 MG tablet I po q hs  . [DISCONTINUED] ferrous sulfate 325 (65 FE) MG EC tablet Take 1 tablet (325 mg total) by mouth 3 (three) times daily with meals.  . [DISCONTINUED] pravastatin (PRAVACHOL) 40 MG tablet Take 1 tablet (40 mg total) by mouth daily.    Allergies:  Allergies  Allergen Reactions  . No Known Allergies     Review of Systems:  A fourteen system review of systems was performed and found to be positive as per HPI.   Objective:   Blood pressure 139/77, pulse 92, height 5\' 11"  (1.803 m), weight 220 lb 12.8 oz (100.2 kg), SpO2 97 %. Body mass index is 30.8 kg/m. General:  Well Developed, well nourished, appropriate for stated age.  Neuro:  Alert and oriented,  extra-ocular muscles intact  HEENT:  Normocephalic, atraumatic, neck supple, no carotid bruits appreciated  Skin:  no gross rash, warm, pink. Cardiac:  RRR, S1 S2 Respiratory:  ECTA B/L and A/P, Not using accessory muscles, speaking in  full sentences- unlabored. Vascular:  Ext warm, no cyanosis apprec.; cap RF less 2 sec. 2+ pitting edema bilaterally, stable. Psych:  No HI/SI, judgement and insight good, Euthymic mood. Full Affect.

## 2018-01-18 LAB — CBC WITH DIFFERENTIAL/PLATELET
BASOS ABS: 0 10*3/uL (ref 0.0–0.2)
Basos: 0 %
EOS (ABSOLUTE): 0.1 10*3/uL (ref 0.0–0.4)
Eos: 2 %
HEMOGLOBIN: 14.1 g/dL (ref 13.0–17.7)
Hematocrit: 42.9 % (ref 37.5–51.0)
IMMATURE GRANS (ABS): 0 10*3/uL (ref 0.0–0.1)
Immature Granulocytes: 0 %
LYMPHS: 23 %
Lymphocytes Absolute: 1.1 10*3/uL (ref 0.7–3.1)
MCH: 29.7 pg (ref 26.6–33.0)
MCHC: 32.9 g/dL (ref 31.5–35.7)
MCV: 90 fL (ref 79–97)
MONOCYTES: 11 %
Monocytes Absolute: 0.6 10*3/uL (ref 0.1–0.9)
Neutrophils Absolute: 3.2 10*3/uL (ref 1.4–7.0)
Neutrophils: 64 %
Platelets: 238 10*3/uL (ref 150–379)
RBC: 4.75 x10E6/uL (ref 4.14–5.80)
RDW: 13.8 % (ref 12.3–15.4)
WBC: 5 10*3/uL (ref 3.4–10.8)

## 2018-01-18 LAB — COMPREHENSIVE METABOLIC PANEL
ALBUMIN: 4.2 g/dL (ref 3.5–4.7)
ALK PHOS: 90 IU/L (ref 39–117)
ALT: 10 IU/L (ref 0–44)
AST: 15 IU/L (ref 0–40)
Albumin/Globulin Ratio: 1.9 (ref 1.2–2.2)
BUN / CREAT RATIO: 10 (ref 10–24)
BUN: 13 mg/dL (ref 8–27)
Bilirubin Total: 0.3 mg/dL (ref 0.0–1.2)
CALCIUM: 9.5 mg/dL (ref 8.6–10.2)
CO2: 27 mmol/L (ref 20–29)
CREATININE: 1.31 mg/dL — AB (ref 0.76–1.27)
Chloride: 96 mmol/L (ref 96–106)
GFR calc non Af Amer: 50 mL/min/{1.73_m2} — ABNORMAL LOW (ref >59)
GFR, EST AFRICAN AMERICAN: 57 mL/min/{1.73_m2} — AB (ref >59)
GLUCOSE: 84 mg/dL (ref 65–99)
Globulin, Total: 2.2 g/dL (ref 1.5–4.5)
Potassium: 5.4 mmol/L — ABNORMAL HIGH (ref 3.5–5.2)
Sodium: 139 mmol/L (ref 134–144)
TOTAL PROTEIN: 6.4 g/dL (ref 6.0–8.5)

## 2018-01-18 LAB — IRON AND TIBC
Iron Saturation: 9 % — CL (ref 15–55)
Iron: 34 ug/dL — ABNORMAL LOW (ref 38–169)
TIBC: 361 ug/dL (ref 250–450)
UIBC: 327 ug/dL (ref 111–343)

## 2018-01-18 LAB — PHOSPHORUS: PHOSPHORUS: 3.7 mg/dL (ref 2.5–4.5)

## 2018-01-18 LAB — HEMOGLOBIN A1C
Est. average glucose Bld gHb Est-mCnc: 103 mg/dL
HEMOGLOBIN A1C: 5.2 % (ref 4.8–5.6)

## 2018-01-18 LAB — VITAMIN B12: VITAMIN B 12: 257 pg/mL (ref 232–1245)

## 2018-01-18 LAB — FERRITIN: Ferritin: 48 ng/mL (ref 30–400)

## 2018-01-18 LAB — FOLATE: FOLATE: 17 ng/mL (ref >3.0)

## 2018-01-18 LAB — MAGNESIUM: Magnesium: 2.1 mg/dL (ref 1.6–2.3)

## 2018-05-18 ENCOUNTER — Inpatient Hospital Stay (HOSPITAL_BASED_OUTPATIENT_CLINIC_OR_DEPARTMENT_OTHER)
Admission: EM | Admit: 2018-05-18 | Discharge: 2018-05-30 | DRG: 330 | Disposition: A | Discharge status: Home Health | Payer: Medicare HMO | Attending: General Surgery | Admitting: General Surgery

## 2018-05-18 ENCOUNTER — Other Ambulatory Visit: Payer: Self-pay

## 2018-05-18 ENCOUNTER — Emergency Department (HOSPITAL_BASED_OUTPATIENT_CLINIC_OR_DEPARTMENT_OTHER): Payer: Medicare HMO

## 2018-05-18 ENCOUNTER — Encounter: Payer: Self-pay | Admitting: Family Medicine

## 2018-05-18 ENCOUNTER — Ambulatory Visit (INDEPENDENT_AMBULATORY_CARE_PROVIDER_SITE_OTHER): Payer: Medicare HMO | Admitting: Family Medicine

## 2018-05-18 VITALS — BP 167/102 | HR 114 | Ht 71.0 in | Wt 217.7 lb

## 2018-05-18 DIAGNOSIS — Z79899 Other long term (current) drug therapy: Secondary | ICD-10-CM

## 2018-05-18 DIAGNOSIS — I252 Old myocardial infarction: Secondary | ICD-10-CM | POA: Diagnosis not present

## 2018-05-18 DIAGNOSIS — K589 Irritable bowel syndrome without diarrhea: Secondary | ICD-10-CM | POA: Diagnosis not present

## 2018-05-18 DIAGNOSIS — E785 Hyperlipidemia, unspecified: Secondary | ICD-10-CM | POA: Diagnosis present

## 2018-05-18 DIAGNOSIS — I10 Essential (primary) hypertension: Secondary | ICD-10-CM

## 2018-05-18 DIAGNOSIS — I480 Paroxysmal atrial fibrillation: Secondary | ICD-10-CM

## 2018-05-18 DIAGNOSIS — I2510 Atherosclerotic heart disease of native coronary artery without angina pectoris: Secondary | ICD-10-CM | POA: Diagnosis present

## 2018-05-18 DIAGNOSIS — R638 Other symptoms and signs concerning food and fluid intake: Secondary | ICD-10-CM

## 2018-05-18 DIAGNOSIS — F1010 Alcohol abuse, uncomplicated: Secondary | ICD-10-CM | POA: Diagnosis not present

## 2018-05-18 DIAGNOSIS — R682 Dry mouth, unspecified: Secondary | ICD-10-CM | POA: Diagnosis not present

## 2018-05-18 DIAGNOSIS — Z419 Encounter for procedure for purposes other than remedying health state, unspecified: Secondary | ICD-10-CM

## 2018-05-18 DIAGNOSIS — K559 Vascular disorder of intestine, unspecified: Secondary | ICD-10-CM | POA: Diagnosis not present

## 2018-05-18 DIAGNOSIS — R198 Other specified symptoms and signs involving the digestive system and abdomen: Secondary | ICD-10-CM | POA: Diagnosis not present

## 2018-05-18 DIAGNOSIS — E782 Mixed hyperlipidemia: Secondary | ICD-10-CM

## 2018-05-18 DIAGNOSIS — E279 Disorder of adrenal gland, unspecified: Secondary | ICD-10-CM | POA: Diagnosis not present

## 2018-05-18 DIAGNOSIS — N179 Acute kidney failure, unspecified: Secondary | ICD-10-CM | POA: Diagnosis not present

## 2018-05-18 DIAGNOSIS — Z9842 Cataract extraction status, left eye: Secondary | ICD-10-CM | POA: Diagnosis not present

## 2018-05-18 DIAGNOSIS — I129 Hypertensive chronic kidney disease with stage 1 through stage 4 chronic kidney disease, or unspecified chronic kidney disease: Secondary | ICD-10-CM | POA: Diagnosis present

## 2018-05-18 DIAGNOSIS — Z7982 Long term (current) use of aspirin: Secondary | ICD-10-CM

## 2018-05-18 DIAGNOSIS — K9189 Other postprocedural complications and disorders of digestive system: Secondary | ICD-10-CM

## 2018-05-18 DIAGNOSIS — I25119 Atherosclerotic heart disease of native coronary artery with unspecified angina pectoris: Secondary | ICD-10-CM | POA: Diagnosis present

## 2018-05-18 DIAGNOSIS — K6389 Other specified diseases of intestine: Secondary | ICD-10-CM | POA: Diagnosis present

## 2018-05-18 DIAGNOSIS — Z86010 Personal history of colonic polyps: Secondary | ICD-10-CM | POA: Diagnosis not present

## 2018-05-18 DIAGNOSIS — K56691 Other complete intestinal obstruction: Secondary | ICD-10-CM

## 2018-05-18 DIAGNOSIS — H9190 Unspecified hearing loss, unspecified ear: Secondary | ICD-10-CM | POA: Diagnosis present

## 2018-05-18 DIAGNOSIS — Z4659 Encounter for fitting and adjustment of other gastrointestinal appliance and device: Secondary | ICD-10-CM

## 2018-05-18 DIAGNOSIS — R112 Nausea with vomiting, unspecified: Secondary | ICD-10-CM

## 2018-05-18 DIAGNOSIS — I739 Peripheral vascular disease, unspecified: Secondary | ICD-10-CM

## 2018-05-18 DIAGNOSIS — D500 Iron deficiency anemia secondary to blood loss (chronic): Secondary | ICD-10-CM | POA: Diagnosis not present

## 2018-05-18 DIAGNOSIS — C182 Malignant neoplasm of ascending colon: Secondary | ICD-10-CM | POA: Diagnosis not present

## 2018-05-18 DIAGNOSIS — K5669 Other partial intestinal obstruction: Secondary | ICD-10-CM | POA: Diagnosis not present

## 2018-05-18 DIAGNOSIS — Z951 Presence of aortocoronary bypass graft: Secondary | ICD-10-CM

## 2018-05-18 DIAGNOSIS — Z8249 Family history of ischemic heart disease and other diseases of the circulatory system: Secondary | ICD-10-CM

## 2018-05-18 DIAGNOSIS — Z808 Family history of malignant neoplasm of other organs or systems: Secondary | ICD-10-CM | POA: Diagnosis not present

## 2018-05-18 DIAGNOSIS — N183 Chronic kidney disease, stage 3 (moderate): Secondary | ICD-10-CM | POA: Diagnosis not present

## 2018-05-18 DIAGNOSIS — Z9841 Cataract extraction status, right eye: Secondary | ICD-10-CM

## 2018-05-18 DIAGNOSIS — K56609 Unspecified intestinal obstruction, unspecified as to partial versus complete obstruction: Secondary | ICD-10-CM | POA: Diagnosis present

## 2018-05-18 DIAGNOSIS — G609 Hereditary and idiopathic neuropathy, unspecified: Secondary | ICD-10-CM | POA: Diagnosis present

## 2018-05-18 DIAGNOSIS — C183 Malignant neoplasm of hepatic flexure: Secondary | ICD-10-CM | POA: Diagnosis not present

## 2018-05-18 DIAGNOSIS — R0602 Shortness of breath: Secondary | ICD-10-CM

## 2018-05-18 DIAGNOSIS — F5089 Other specified eating disorder: Secondary | ICD-10-CM

## 2018-05-18 DIAGNOSIS — K5900 Constipation, unspecified: Secondary | ICD-10-CM | POA: Diagnosis not present

## 2018-05-18 DIAGNOSIS — Z87891 Personal history of nicotine dependence: Secondary | ICD-10-CM | POA: Diagnosis not present

## 2018-05-18 DIAGNOSIS — R1084 Generalized abdominal pain: Secondary | ICD-10-CM

## 2018-05-18 DIAGNOSIS — K921 Melena: Secondary | ICD-10-CM

## 2018-05-18 DIAGNOSIS — R140 Abdominal distension (gaseous): Secondary | ICD-10-CM

## 2018-05-18 DIAGNOSIS — G629 Polyneuropathy, unspecified: Secondary | ICD-10-CM | POA: Diagnosis present

## 2018-05-18 DIAGNOSIS — K567 Ileus, unspecified: Secondary | ICD-10-CM

## 2018-05-18 DIAGNOSIS — K639 Disease of intestine, unspecified: Secondary | ICD-10-CM | POA: Diagnosis not present

## 2018-05-18 DIAGNOSIS — E876 Hypokalemia: Secondary | ICD-10-CM | POA: Diagnosis not present

## 2018-05-18 DIAGNOSIS — I214 Non-ST elevation (NSTEMI) myocardial infarction: Secondary | ICD-10-CM

## 2018-05-18 DIAGNOSIS — R34 Anuria and oliguria: Secondary | ICD-10-CM | POA: Diagnosis not present

## 2018-05-18 DIAGNOSIS — C772 Secondary and unspecified malignant neoplasm of intra-abdominal lymph nodes: Secondary | ICD-10-CM | POA: Diagnosis not present

## 2018-05-18 DIAGNOSIS — K219 Gastro-esophageal reflux disease without esophagitis: Secondary | ICD-10-CM | POA: Diagnosis present

## 2018-05-18 DIAGNOSIS — D490 Neoplasm of unspecified behavior of digestive system: Secondary | ICD-10-CM | POA: Diagnosis not present

## 2018-05-18 DIAGNOSIS — Z4682 Encounter for fitting and adjustment of non-vascular catheter: Secondary | ICD-10-CM | POA: Diagnosis not present

## 2018-05-18 DIAGNOSIS — K56699 Other intestinal obstruction unspecified as to partial versus complete obstruction: Secondary | ICD-10-CM | POA: Diagnosis not present

## 2018-05-18 DIAGNOSIS — I808 Phlebitis and thrombophlebitis of other sites: Secondary | ICD-10-CM | POA: Diagnosis not present

## 2018-05-18 LAB — CBC WITH DIFFERENTIAL/PLATELET
BASOS ABS: 0 10*3/uL (ref 0.0–0.1)
BASOS PCT: 0 %
Eosinophils Absolute: 0 10*3/uL (ref 0.0–0.7)
Eosinophils Relative: 0 %
HEMATOCRIT: 47.6 % (ref 39.0–52.0)
HEMOGLOBIN: 16.2 g/dL (ref 13.0–17.0)
LYMPHS PCT: 4 %
Lymphs Abs: 0.4 10*3/uL — ABNORMAL LOW (ref 0.7–4.0)
MCH: 29.8 pg (ref 26.0–34.0)
MCHC: 34 g/dL (ref 30.0–36.0)
MCV: 87.5 fL (ref 78.0–100.0)
Monocytes Absolute: 0.6 10*3/uL (ref 0.1–1.0)
Monocytes Relative: 5 %
NEUTROS ABS: 9.1 10*3/uL — AB (ref 1.7–7.7)
NEUTROS PCT: 91 %
Platelets: 216 10*3/uL (ref 150–400)
RBC: 5.44 MIL/uL (ref 4.22–5.81)
RDW: 14.1 % (ref 11.5–15.5)
WBC: 10.1 10*3/uL (ref 4.0–10.5)

## 2018-05-18 LAB — COMPREHENSIVE METABOLIC PANEL
ALBUMIN: 4.2 g/dL (ref 3.5–5.0)
ALT: 11 U/L (ref 0–44)
AST: 28 U/L (ref 15–41)
Alkaline Phosphatase: 90 U/L (ref 38–126)
Anion gap: 15 (ref 5–15)
BILIRUBIN TOTAL: 0.9 mg/dL (ref 0.3–1.2)
BUN: 15 mg/dL (ref 8–23)
CO2: 26 mmol/L (ref 22–32)
CREATININE: 1.14 mg/dL (ref 0.61–1.24)
Calcium: 9.8 mg/dL (ref 8.9–10.3)
Chloride: 97 mmol/L — ABNORMAL LOW (ref 98–111)
GFR calc Af Amer: >60 mL/min (ref >60)
GFR calc non Af Amer: 57 mL/min — ABNORMAL LOW (ref >60)
GLUCOSE: 183 mg/dL — AB (ref 70–99)
Potassium: 3.9 mmol/L (ref 3.5–5.1)
Sodium: 138 mmol/L (ref 135–145)
TOTAL PROTEIN: 7.2 g/dL (ref 6.5–8.1)

## 2018-05-18 LAB — LIPASE, BLOOD: Lipase: 38 U/L (ref 11–51)

## 2018-05-18 MED ORDER — IOPAMIDOL (ISOVUE-300) INJECTION 61%
100.0000 mL | Freq: Once | INTRAVENOUS | Status: AC | PRN
  Administered 2018-05-18: 100 mL via INTRAVENOUS

## 2018-05-18 MED ORDER — ONDANSETRON HCL 4 MG/2ML IJ SOLN
4.0000 mg | Freq: Once | INTRAMUSCULAR | Status: AC
  Administered 2018-05-18: 4 mg via INTRAVENOUS
  Filled 2018-05-18: qty 2

## 2018-05-18 MED ORDER — ONDANSETRON HCL 4 MG/2ML IJ SOLN
4.0000 mg | Freq: Once | INTRAMUSCULAR | Status: AC
  Administered 2018-05-18: 4 mg via INTRAVENOUS

## 2018-05-18 MED ORDER — SODIUM CHLORIDE 0.9 % IV BOLUS
1000.0000 mL | Freq: Once | INTRAVENOUS | Status: AC
  Administered 2018-05-18: 1000 mL via INTRAVENOUS

## 2018-05-18 MED ORDER — SODIUM CHLORIDE 0.9 % IV SOLN
INTRAVENOUS | Status: DC
  Administered 2018-05-19: 01:00:00 via INTRAVENOUS

## 2018-05-18 MED ORDER — ONDANSETRON HCL 4 MG/2ML IJ SOLN
INTRAMUSCULAR | Status: AC
  Filled 2018-05-18: qty 2

## 2018-05-18 NOTE — ED Provider Notes (Signed)
[facility] EMERGENCY DEPARTMENT Provider Note   CSN: [id number] Arrival date & time: 05/18/18  1816     History   Chief Complaint Chief Complaint  Patient presents with  . Abdominal Pain    HPI SHARON STAPEL is a 84 y.o. male.  Pt presents to the ED today with abdominal pain, n/v.  The pt said he's been constipated for 2-3 days and thinks that is the problem.  He denies f/c.     Past Medical History:  Diagnosis Date  . Alcohol abuse   . Anemia   . Chronic low back pain 10/14/2011  . Colon polyps    hyperplastic (2004, 2010) and adenomatous (1990).    . COLONIC POLYPS, HX OF 03/05/2008  . Esophageal stricture    hx of  . Gastric ulcer   . GERD 03/05/2008  . GERD (gastroesophageal reflux disease) 1994   associated peptic strictures  . HYPERLIPIDEMIA 03/05/2008  . HYPERTENSION 03/05/2008  . Hypertension   . IBS 03/05/2008  . Impaired glucose tolerance 10/12/2011  . Iron deficiency anemia   . LEG CRAMPS 09/02/2010  . PAD (peripheral artery disease) ([place]) 10/14/2011  . PAD (peripheral artery disease) ([place]) 2013  . PERIPHERAL EDEMA 09/02/2010  . PERIPHERAL NEUROPATHY 03/05/2008  . Prostatitis    hx of  . VARICOSE VEINS, LOWER EXTREMITIES 06/09/2009    Patient Active Problem List   Diagnosis Date Noted  . CAD (coronary artery disease) of artery bypass graft 03/14/2017  . PAF (paroxysmal atrial fibrillation) ([place])   . 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 s/p NSTEMI (non-ST elevated myocardial infarction); with subsequent urgent CABG  5/18 ([place]) 02/12/2017  . Chest pain 02/11/2017  . Overweight (BMI 25.0-29.9) 09/29/2016  . Alcohol use/ h/o heavy abuse 08/11/2016  . Left shoulder pain 01/24/2015  . GI bleed 11/14/2014  . h/o Anemia due to blood loss, acute 11/14/2014  . Eye pain   . Acute esophagitis   . Acute gastric ulcer   . Duodenal ulcer disease   . Upper GI bleed 11/13/2014  . Lumbar disc disease 01/30/2014  . Hearing loss 03/23/2013  . Screening for other  and unspecified cardiovascular conditions 03/23/2013  . PAD (peripheral artery disease) ([place]) 10/14/2011  . Chronic low back pain 10/14/2011  . Glucose intolerance (impaired glucose tolerance) 10/12/2011  . Preventative health care 10/12/2011  . LEG CRAMPS 09/02/2010  . PERIPHERAL EDEMA 09/02/2010  . VARICOSE VEINS, LOWER EXTREMITIES 06/09/2009  . HLD (hyperlipidemia) 03/05/2008  . Anemia, iron deficiency 03/05/2008  . Hereditary and idiopathic peripheral neuropathy 03/05/2008  . HTN (hypertension) 03/05/2008  . GERD 03/05/2008  . IBS 03/05/2008  . Blood in stool 03/05/2008  . COLONIC POLYPS, HX OF 03/05/2008    Past Surgical History:  Procedure Laterality Date  . CATARACT EXTRACTION  bilat  . CORONARY ARTERY BYPASS GRAFT N/A 02/15/2017   Procedure: CORONARY ARTERY BYPASS GRAFTING times three  with left internal mammary harvest and endoscopic harvest of Right SVG. Grafts of LIMA to  LAD, SVG to Distal Circ, and to First Diag.;  Surgeon: Grace Isaac, MD;  Location: [facility];  Service: Open Heart Surgery;  Laterality: N/A;  . ESOPHAGOGASTRODUODENOSCOPY N/A 11/14/2014   Procedure: ESOPHAGOGASTRODUODENOSCOPY (EGD);  Surgeon: Jerene Bears, MD;  Location: [facility] ENDOSCOPY;  Service: Endoscopy;  Laterality: N/A;  . LEFT HEART CATH AND CORONARY ANGIOGRAPHY N/A 02/14/2017   Procedure: Left Heart Cath and Coronary Angiography;  Surgeon: Belva Crome, MD;  Location: [facility] INVASIVE CV  LAB;  Service: Cardiovascular;  Laterality: N/A;  . LUMBAR SPINE SURGERY  11/2008   Dr Joya Salm  . TEE WITHOUT CARDIOVERSION N/A 02/15/2017   Procedure: TRANSESOPHAGEAL ECHOCARDIOGRAM (TEE);  Surgeon: Grace Isaac, MD;  Location: [facility];  Service: Open Heart Surgery;  Laterality: N/A;        Home Medications    Prior to Admission medications   Medication Sig Start Date End Date Taking? Authorizing Provider  aspirin EC 81 MG EC tablet Take 1 tablet (81 mg total) by mouth daily. 02/25/17   Elgie Collard, PA-C    ferrous sulfate 325 (65 FE) MG EC tablet Take 1 tablet (325 mg total) by mouth 3 (three) times daily with meals. 01/17/18   Opalski, Neoma Laming, DO  furosemide (LASIX) 40 MG tablet Take 1 tablet (40 mg total) by mouth daily. 08/10/17 08/10/18  Dorothy Spark, MD  metoprolol succinate (TOPROL XL) 50 MG 24 hr tablet Take 1 tablet (50 mg total) by mouth daily. Take with or immediately following a meal. 05/24/17   Imogene Burn, PA-C  pravastatin (PRAVACHOL) 40 MG tablet I po q hs 01/17/18   [name], DO    Family History Family History  Problem Relation Age of Onset  . Cancer Mother        Brain Cancer  . Diabetes Father   . Heart disease Father        CAD  . Hypertension Father   . Stroke Father   . Diabetes Sister   . Stomach cancer Neg Hx   . Pancreatic cancer Neg Hx   . Colon cancer Neg Hx   . Esophageal cancer Neg Hx     Social History Social History   Tobacco Use  . Smoking status: Former Smoker    Packs/day: 1.25    Years: 50.00    Pack years: 62.50    Types: Cigarettes    Last attempt to quit: 11/11/2008    Years since quitting: 9.5  . Smokeless tobacco: Former Systems developer    Types: Chew  Substance Use Topics  . Alcohol use: Yes    Alcohol/week: 6.0 standard drinks    Types: 2 Glasses of wine, 4 Cans of beer per week  . Drug use: No     Allergies   No known allergies   Review of Systems Review of Systems  Gastrointestinal: Positive for abdominal pain, constipation, nausea and vomiting.  All other systems reviewed and are negative.    Physical Exam Updated Vital Signs BP (!) 155/90 (BP Location: Right Arm)   Pulse (!) 103   Temp 98 F (36.7 C) (Oral)   Resp 17   Ht 5\' 11"  (1.803 m)   Wt 98.7 kg   SpO2 96%   BMI 30.35 kg/m   Physical Exam  Constitutional: He is oriented to person, place, and time. He appears well-developed and well-nourished.  HENT:  Head: Normocephalic and atraumatic.  Mouth/Throat: Mucous membranes are dry.  Eyes: Pupils  are equal, round, and reactive to light. EOM are normal.  Cardiovascular: Regular rhythm. Tachycardia present.  Pulmonary/Chest: Effort normal and breath sounds normal.  Abdominal: Normal appearance. He exhibits distension. Bowel sounds are decreased. There is generalized tenderness.  Mass felt below liver  Neurological: He is alert and oriented to person, place, and time.  Skin: Skin is warm and dry. Capillary refill takes less than 2 seconds.  Psychiatric: He has a normal mood and affect. His behavior is normal.  Nursing note and vitals  reviewed.    ED Treatments / Results  Labs (all labs ordered are listed, but only abnormal results are displayed) Labs Reviewed  CBC WITH DIFFERENTIAL/PLATELET - Abnormal; Notable for the following components:      Result Value   Neutro Abs 9.1 (*)    Lymphs Abs 0.4 (*)    All other components within normal limits  COMPREHENSIVE METABOLIC PANEL - Abnormal; Notable for the following components:   Chloride 97 (*)    Glucose, Bld 183 (*)    GFR calc non Af Amer 57 (*)    All other components within normal limits  LIPASE, BLOOD  URINALYSIS, ROUTINE W REFLEX MICROSCOPIC    EKG None  Radiology Ct Abdomen Pelvis W Contrast  Result Date: 05/18/2018 CLINICAL DATA:  84 year old male with history of bloating and constipation for the past 2 days. Emesis today. EXAM: CT ABDOMEN AND PELVIS WITH CONTRAST TECHNIQUE: Multidetector CT imaging of the abdomen and pelvis was performed using the standard protocol following bolus administration of intravenous contrast. CONTRAST:  128mL ISOVUE-300 IOPAMIDOL (ISOVUE-300) INJECTION 61% COMPARISON:  CT the abdomen and pelvis 05/22/2009. FINDINGS: Lower chest: Aortic atherosclerosis. Atherosclerotic calcifications noted in the left main, left anterior descending, left circumflex and right coronary arteries. Thickening calcification of the aortic valve. Lipomatous hypertrophy of the interatrial septum (normal anatomical  variant) incidentally noted. Status post median sternotomy for CABG. Hepatobiliary: No cystic or solid hepatic lesions. No intra or extrahepatic biliary ductal dilatation. Gallbladder is normal in appearance. Pancreas: No pancreatic mass. No pancreatic ductal dilatation. No pancreatic or peripancreatic fluid or inflammatory changes. Spleen: Calcified granulomas throughout the spleen. Adrenals/Urinary Tract: 2.4 cm left adrenal nodule (axial image 35 of series 2), stable compared to remote prior examinations, previously characterized as an adenoma. Right adrenal gland is unremarkable. Multiple subcentimeter low-attenuation lesions in the left kidney, too small to characterize, but statistically likely to represent tiny cysts. 1.4 cm simple cyst in the interpolar region of the left kidney. Vascular calcifications associated with both kidneys. Right kidney is otherwise unremarkable in appearance. No hydroureteronephrosis. Urinary bladder is normal in appearance. Stomach/Bowel: The appearance of the stomach is unremarkable. In the region of the hepatic flexure there is a mass-like area of mural thickening and luminal narrowing which measures approximately 4.7 x 4.5 x 4.7 cm (axial image 40 of series 2 and sagittal image 110 of series 6), highly concerning for primary colonic neoplasm. Slight haziness in the surrounding soft tissues is noted. Proximal to this there is dilatation of the cecum and ascending colon with liquid stool. More distal aspect of the colon and rectum are relatively decompressed. Multiple prominent borderline dilated loops of small bowel measuring up to 3.8 cm. Normal appendix. Vascular/Lymphatic: Aortic atherosclerosis, without evidence of aneurysm or dissection in the abdominal or pelvic vasculature. No lymphadenopathy noted in the abdomen or pelvis. Reproductive: Prostate gland and seminal vesicles are unremarkable in appearance. Other: No significant volume of ascites.  No pneumoperitoneum.  Musculoskeletal: There are no aggressive appearing lytic or blastic lesions noted in the visualized portions of the skeleton. Median sternotomy wires. IMPRESSION: 1. There appears to be a nearly obstructing lesion in the region of the hepatic flexure of the colon which is highly concerning for primary colonic neoplasm. Slight haziness of the surrounding soft tissues may be indicative of early local invasion. This appears partially obstructive as evidenced by dilatation of more proximal aspects of the small bowel and colon. 2. Left adrenal lesion is stable compared to prior studies, previously characterized as  an adenoma. 3. Aortic atherosclerosis, in addition to left main and 3 vessel coronary artery disease. Status post median sternotomy for CABG. 4. Additional incidental findings, as above. Aortic Atherosclerosis (ICD10-I70.0). Electronically Signed   By: Vinnie Langton M.D.   On: 05/18/2018 22:07   Dg Abdomen Acute W/chest  Result Date: 05/18/2018 CLINICAL DATA:  84 year old male with history of constipation, bloating and nausea for the past 4 days. EXAM: DG ABDOMEN ACUTE W/ 1V CHEST COMPARISON:  Chest x-ray 09/14/2017. FINDINGS: Lung volumes are normal. No consolidative airspace disease. No pleural effusions. No pneumothorax. No pulmonary nodule or mass noted. Pulmonary vasculature and the cardiomediastinal silhouette are within normal limits. Low aortic atherosclerosis. Status post median sternotomy. Multiple dilated loops of gas-filled small bowel are noted measuring up to 5.7 cm in diameter with several air-fluid levels noted on the upright projection. Gas and stool is noted throughout the colon extending to the level of the distal rectum. No pneumoperitoneum. IMPRESSION: 1. Nonspecific bowel gas pattern, as above. Findings could either reflect early or partial small bowel obstruction, or could be indicative of ileus. 2. No pneumoperitoneum. 3. No radiographic evidence of acute cardiopulmonary disease.  4. Aortic atherosclerosis. Electronically Signed   By: Vinnie Langton M.D.   On: 05/18/2018 20:19    Procedures Procedures (including critical care time)  Medications Ordered in ED Medications  sodium chloride 0.9 % bolus 1,000 mL ( Intravenous Stopped 05/18/18 2035)  ondansetron (ZOFRAN) injection 4 mg (4 mg Intravenous Given 05/18/18 1852)  iopamidol (ISOVUE-300) 61 % injection 100 mL (100 mLs Intravenous Contrast Given 05/18/18 2116)     Initial Impression / Assessment and Plan / ED Course  I have reviewed the triage vital signs and the nursing notes.  Pertinent labs & imaging results that were available during my care of the patient were reviewed by me and considered in my medical decision making (see chart for details).   Pt d/w Dr. Marcello Moores (surgery) who will consult and determine if he's a surgical candidate.  As pt has multiple medical problems, she requested that hospitalists admit.  Pt d/w Dr. Alcario Drought (triad) who will admit.     Final Clinical Impressions(s) / ED Diagnoses   Final diagnoses:  Malignant neoplasm of hepatic flexure [facility])  Other complete intestinal obstruction [facility])    ED Discharge Orders    None       Isla Pence, MD 05/18/18 2259

## 2018-05-18 NOTE — ED Notes (Signed)
Called [facility] to request consult with surgeon

## 2018-05-18 NOTE — ED Notes (Signed)
Called [facility] and s/w Maudie Mercury - requested to have [facility] call for consult

## 2018-05-18 NOTE — ED Notes (Signed)
Pt. Reports 2 days ago trouble pooping so he took more than his Miralax  After missing it on Monday.  Pt. Reports he started vomiting after eating [place] yesterday and today.  Pt. Started having with BMs on Wed.   He took more Miralax x 3 but no results.   Pt. Reports 6 x s in 24 hours.

## 2018-05-18 NOTE — Progress Notes (Signed)
Pt here for an acute care OV today    Impression and Recommendations:    1. r/o Colitis, ischemic ([name])   2. PAD (peripheral artery disease) ([place])   3. Acute onset generalized distention and abdominal pain   4. Abdominal guarding   5. Nausea and vomiting, intractability of vomiting not specified, unspecified vomiting type   6. Unable to eat   7. Constipation, unspecified constipation type   8. Poorly-controlled hypertension   9. Mixed hyperlipidemia   10. h/o Gastrointestinal hemorrhage with melena   11. PAF (paroxysmal atrial fibrillation) ([name])   12. s/p NSTEMI (non-ST elevated myocardial infarction); with subsequent urgent CABG  5/18 ([place])     1. Severe Abdominal Pain w/ guarding also unable to take PO's  I explained to patient below reasons for sending him to ED:  - With patient with sign h/o peripheral arterial disease, and multiple medical problems to include but not limited to coronary artery disease s/p CABG x3, PAF, poorly controlled hypertension, hyperlipidemia, with acute onset generalized abdominal pain, distention and vomiting -unable to take p.o.    - Concern for ischemic colitis- stat to ED, wife will take patient to ED now- declines ambulance. - Patient needs acute supportive care which can only be given to pt in ED - will need stat labs, imaging etc -Medical evaluation and concerns discussed with patient's wife as well     Education and routine counseling performed. Handouts provided   Please see AVS handed out to patient at the end of our visit for further patient instructions/ counseling done pertaining to today's office visit.   Return for to ED right now for w/up and emergent care.     Note:  This document was prepared occasionally using Dragon voice recognition software and may include unintentional dictation errors in addition to a scribe.  This document serves as a record of services personally performed by [name], DO. It was created  on her behalf by Toni Amend, a trained medical scribe. The creation of this record is based on the scribe's personal observations and the provider's statements to them.   I have reviewed the above medical documentation for accuracy and completeness and I concur.  [name] 05/18/18 5:29 PM   --------------------------------------------------------------------------------------------------   Subjective:    CC:  Chief Complaint  Patient presents with  . Constipation    HPI: Vincent Mcclure is a 84 y.o. male who presents to [place] at [facility] today for issues as discussed below.  Patient reports acute onset of abdominal pain, swelling, no bowel movement since Tuesday, and vomiting since last night- unable to keep anything down excpt for "a few ice chips."  Feels that his constipation started two days ago.  Gotten worse and more bloated since.  Describes his abdominal pain as a six up to a nine/ 10 at times.   vomiting bilious material now and unable to take po  Patient states he has thrown up six times since yesterday evening.  Notes most of it has been dry heaves, because he hasn't eaten anything-  hasn't been able to keep water or food down.   Unable to keep anything down excpt for "a few ice chips."     Wt Readings from Last 3 Encounters:  05/18/18 217 lb 11.2 oz (98.7 kg)  01/17/18 220 lb 12.8 oz (100.2 kg)  09/14/17 216 lb 3.2 oz (98.1 kg)   BP Readings from Last 3 Encounters:  05/18/18 Marland Kitchen)  167/102  01/17/18 139/77  09/14/17 120/67   BMI Readings from Last 3 Encounters:  05/18/18 30.36 kg/m  01/17/18 30.80 kg/m  09/14/17 30.15 kg/m     Patient Care Team    Relationship Specialty Notifications Start End  [name], DO PCP - General Family Medicine  09/29/16   Pyrtle, Lajuan Lines, MD Consulting Physician Gastroenterology  09/29/16   Allyn Kenner, MD Consulting Physician Dermatology  09/29/16   Dorothy Spark, MD Consulting  Physician Cardiology  04/06/17   Levin Erp, [place] Physician Assistant Gastroenterology  07/07/17   Murrell Converse Physician Assistant Cardiology  07/07/17      Patient Active Problem List   Diagnosis Date Noted  . CAD (coronary artery disease) of artery bypass graft 03/14/2017    Priority: High  . PAF (paroxysmal atrial fibrillation) ([name])     Priority: High  . s/p NSTEMI (non-ST elevated myocardial infarction); with subsequent urgent CABG  5/18 ([place]) 02/12/2017    Priority: High  . PAD (peripheral artery disease) ([place]) 10/14/2011    Priority: High  . Glucose intolerance (impaired glucose tolerance) 10/12/2011    Priority: High  . HLD (hyperlipidemia) 03/05/2008    Priority: High  . HTN (hypertension) 03/05/2008    Priority: High  . Overweight (BMI 25.0-29.9) 09/29/2016    Priority: Medium  . GI bleed 11/14/2014    Priority: Medium  . Acute esophagitis     Priority: Medium  . Acute gastric ulcer     Priority: Medium  . Duodenal ulcer disease     Priority: Medium  . Anemia, iron deficiency 03/05/2008    Priority: Medium  . GERD 03/05/2008    Priority: Medium  . IBS 03/05/2008    Priority: Medium  . COLONIC POLYPS, HX OF 03/05/2008    Priority: Medium  . Alcohol use/ h/o heavy abuse 08/11/2016    Priority: Low  . Blood in stool 03/05/2008    Priority: Low  . 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 Chest pain 02/11/2017  . Left shoulder pain 01/24/2015  . h/o Anemia due to blood loss, acute 11/14/2014  . Eye pain   . Upper GI bleed 11/13/2014  . Lumbar disc disease 01/30/2014  . Hearing loss 03/23/2013  . Screening for other and unspecified cardiovascular conditions 03/23/2013  . Chronic low back pain 10/14/2011  . Preventative health care 10/12/2011  . LEG CRAMPS 09/02/2010  . PERIPHERAL EDEMA 09/02/2010  . VARICOSE VEINS, LOWER EXTREMITIES 06/09/2009  . Hereditary and idiopathic peripheral neuropathy 03/05/2008    Past Medical history, Surgical history,  Family history, Social history, Allergies and Medications have been entered into the medical record, reviewed and changed as needed.    Current Meds  Medication Sig  . aspirin EC 81 MG EC tablet Take 1 tablet (81 mg total) by mouth daily.  . ferrous sulfate 325 (65 FE) MG EC tablet Take 1 tablet (325 mg total) by mouth 3 (three) times daily with meals.  . furosemide (LASIX) 40 MG tablet Take 1 tablet (40 mg total) by mouth daily.  . metoprolol succinate (TOPROL XL) 50 MG 24 hr tablet Take 1 tablet (50 mg total) by mouth daily. Take with or immediately following a meal.  . pravastatin (PRAVACHOL) 40 MG tablet I po q hs    Allergies:  Allergies  Allergen Reactions  . No Known Allergies      Review of Systems: General:   Denies fever, chills, unexplained weight loss.  Optho/Auditory:  Denies visual changes, blurred vision/LOV Respiratory:   Denies wheeze, DOE more than baseline levels.   Cardiovascular:   Denies chest pain, palpitations, new onset peripheral edema  Gastrointestinal:   + nausea, vomiting, abd pain, constipation. Genitourinary: Denies dysuria, freq/ urgency, flank pain or discharge from genitals.  Endocrine:     Denies hot or cold intolerance, polyuria, polydipsia. Musculoskeletal:   Denies unexplained myalgias, joint swelling, unexplained arthralgias, gait problems.  Skin:  Denies new onset rash, suspicious lesions Neurological:     Denies numbness, + feels very weak and unsteady Psychiatric/Behavioral:   Denies mood changes, suicidal or homicidal ideations, hallucinations    Objective:   Blood pressure (!) 167/102, pulse (!) 114, height 5\' 11"  (1.803 m), weight 217 lb 11.2 oz (98.7 kg), SpO2 97 %. Body mass index is 30.36 kg/m. General:  Well Developed, well nourished, appropriate for stated age.  Neuro:  Alert and oriented,  extra-ocular muscles intact  HEENT:  Normocephalic, atraumatic, neck supple Skin:  no gross rash, warm, pink. Cardiac:  Irregularly  irregular, S1 S2 Respiratory:  ECTA B/L and A/P, Not using accessory muscles, speaking in full sentences- unlabored. Vascular:  Ext warm, no cyanosis apprec.; cap RF less 2 sec. Psych:  No HI/SI, judgement and insight good, Euthymic mood. Full Affect. Abdominal: Grossly distended, + hypoactive bowel sounds *4 Q, + G/R/R, + pain unable palpation

## 2018-05-18 NOTE — ED Triage Notes (Addendum)
Pt sent from his dr due to abd pain, bloating, constipation x 2 days. Pt also report vomiting today.

## 2018-05-18 NOTE — ED Notes (Signed)
Pt. Reports he does not need to urinate and has not given a urine.

## 2018-05-19 ENCOUNTER — Encounter (HOSPITAL_COMMUNITY): Payer: Self-pay

## 2018-05-19 ENCOUNTER — Inpatient Hospital Stay (HOSPITAL_COMMUNITY): Payer: Medicare HMO | Admitting: Certified Registered Nurse Anesthetist

## 2018-05-19 ENCOUNTER — Inpatient Hospital Stay (HOSPITAL_COMMUNITY): Payer: Medicare HMO

## 2018-05-19 ENCOUNTER — Encounter (HOSPITAL_COMMUNITY): Admission: EM | Disposition: A | Discharge status: Home Health | Payer: Self-pay | Source: Home / Self Care

## 2018-05-19 DIAGNOSIS — K56691 Other complete intestinal obstruction: Secondary | ICD-10-CM

## 2018-05-19 DIAGNOSIS — K6389 Other specified diseases of intestine: Secondary | ICD-10-CM | POA: Diagnosis present

## 2018-05-19 DIAGNOSIS — C183 Malignant neoplasm of hepatic flexure: Principal | ICD-10-CM

## 2018-05-19 HISTORY — PX: LAPAROTOMY: SHX154

## 2018-05-19 LAB — HEMOGLOBIN A1C
Hgb A1c MFr Bld: 5.4 % (ref 4.8–5.6)
Mean Plasma Glucose: 108.28 mg/dL

## 2018-05-19 LAB — BASIC METABOLIC PANEL
ANION GAP: 10 (ref 5–15)
BUN: 18 mg/dL (ref 8–23)
CALCIUM: 9.1 mg/dL (ref 8.9–10.3)
CO2: 27 mmol/L (ref 22–32)
Chloride: 104 mmol/L (ref 98–111)
Creatinine, Ser: 1 mg/dL (ref 0.61–1.24)
GLUCOSE: 156 mg/dL — AB (ref 70–99)
POTASSIUM: 3.5 mmol/L (ref 3.5–5.1)
SODIUM: 141 mmol/L (ref 135–145)

## 2018-05-19 LAB — GLUCOSE, CAPILLARY: Glucose-Capillary: 132 mg/dL — ABNORMAL HIGH (ref 70–99)

## 2018-05-19 SURGERY — LAPAROTOMY, EXPLORATORY
Anesthesia: General | Site: Abdomen

## 2018-05-19 MED ORDER — HYDROMORPHONE HCL 2 MG/ML IJ SOLN
INTRAMUSCULAR | Status: AC
  Filled 2018-05-19: qty 1

## 2018-05-19 MED ORDER — PHENYLEPHRINE 40 MCG/ML (10ML) SYRINGE FOR IV PUSH (FOR BLOOD PRESSURE SUPPORT)
PREFILLED_SYRINGE | INTRAVENOUS | Status: DC | PRN
  Administered 2018-05-19 (×2): 80 ug via INTRAVENOUS

## 2018-05-19 MED ORDER — ESMOLOL HCL 100 MG/10ML IV SOLN
INTRAVENOUS | Status: DC | PRN
  Administered 2018-05-19: 30 mg via INTRAVENOUS

## 2018-05-19 MED ORDER — GLYCOPYRROLATE PF 0.2 MG/ML IJ SOSY
PREFILLED_SYRINGE | INTRAMUSCULAR | Status: AC
  Filled 2018-05-19: qty 1

## 2018-05-19 MED ORDER — SODIUM CHLORIDE 0.9 % IV SOLN: 2.0000 g | INTRAVENOUS | Status: DC

## 2018-05-19 MED ORDER — FENTANYL CITRATE (PF) 100 MCG/2ML IJ SOLN
INTRAMUSCULAR | Status: DC | PRN
  Administered 2018-05-19: 50 ug via INTRAVENOUS
  Administered 2018-05-19: 100 ug via INTRAVENOUS
  Administered 2018-05-19 (×4): 50 ug via INTRAVENOUS

## 2018-05-19 MED ORDER — CEFOTETAN DISODIUM 2 G IJ SOLR
2.0000 g | INTRAMUSCULAR | Status: AC
  Administered 2018-05-19: 2 g via INTRAVENOUS
  Filled 2018-05-19: qty 2

## 2018-05-19 MED ORDER — CHLORHEXIDINE GLUCONATE CLOTH 2 % EX PADS
6.0000 | MEDICATED_PAD | Freq: Once | CUTANEOUS | Status: AC
  Administered 2018-05-19: 6 via TOPICAL

## 2018-05-19 MED ORDER — SODIUM CHLORIDE 0.9 % IV SOLN
INTRAVENOUS | Status: DC
  Administered 2018-05-19 – 2018-05-22 (×6): via INTRAVENOUS

## 2018-05-19 MED ORDER — ESMOLOL HCL 100 MG/10ML IV SOLN
INTRAVENOUS | Status: AC
  Filled 2018-05-19: qty 10

## 2018-05-19 MED ORDER — SUCCINYLCHOLINE CHLORIDE 200 MG/10ML IV SOSY
PREFILLED_SYRINGE | INTRAVENOUS | Status: AC
  Filled 2018-05-19: qty 10

## 2018-05-19 MED ORDER — ROCURONIUM BROMIDE 50 MG/5ML IV SOSY
PREFILLED_SYRINGE | INTRAVENOUS | Status: DC | PRN
  Administered 2018-05-19: 10 mg via INTRAVENOUS
  Administered 2018-05-19: 50 mg via INTRAVENOUS

## 2018-05-19 MED ORDER — PROPOFOL 10 MG/ML IV BOLUS
INTRAVENOUS | Status: AC
  Filled 2018-05-19: qty 20

## 2018-05-19 MED ORDER — MUPIROCIN 2 % EX OINT
TOPICAL_OINTMENT | CUTANEOUS | Status: AC
  Administered 2018-05-19: 10:00:00
  Filled 2018-05-19: qty 22

## 2018-05-19 MED ORDER — ACETAMINOPHEN 325 MG PO TABS: 650.0000 mg | ORAL_TABLET | Freq: Four times a day (QID) | ORAL | Status: DC | PRN

## 2018-05-19 MED ORDER — SUCCINYLCHOLINE CHLORIDE 200 MG/10ML IV SOSY
PREFILLED_SYRINGE | INTRAVENOUS | Status: DC | PRN
  Administered 2018-05-19: 100 mg via INTRAVENOUS

## 2018-05-19 MED ORDER — ONDANSETRON HCL 4 MG/2ML IJ SOLN: 4.0000 mg | Freq: Once | INTRAMUSCULAR | Status: DC | PRN

## 2018-05-19 MED ORDER — GABAPENTIN 100 MG PO CAPS
100.0000 mg | ORAL_CAPSULE | Freq: Two times a day (BID) | ORAL | Status: DC
  Administered 2018-05-19 – 2018-05-30 (×19): 100 mg via ORAL
  Filled 2018-05-19 (×19): qty 1

## 2018-05-19 MED ORDER — ONDANSETRON HCL 4 MG/2ML IJ SOLN
4.0000 mg | Freq: Four times a day (QID) | INTRAMUSCULAR | Status: DC | PRN
  Administered 2018-05-19 – 2018-05-22 (×5): 4 mg via INTRAVENOUS
  Filled 2018-05-19 (×6): qty 2

## 2018-05-19 MED ORDER — LIDOCAINE 2% (20 MG/ML) 5 ML SYRINGE
INTRAMUSCULAR | Status: AC
  Filled 2018-05-19: qty 5

## 2018-05-19 MED ORDER — FENTANYL CITRATE (PF) 100 MCG/2ML IJ SOLN
25.0000 ug | INTRAMUSCULAR | Status: DC | PRN
  Administered 2018-05-19 (×3): 50 ug via INTRAVENOUS

## 2018-05-19 MED ORDER — SUGAMMADEX SODIUM 200 MG/2ML IV SOLN
INTRAVENOUS | Status: AC
  Filled 2018-05-19: qty 2

## 2018-05-19 MED ORDER — SODIUM CHLORIDE 0.9 % IV SOLN
2.0000 g | Freq: Two times a day (BID) | INTRAVENOUS | Status: AC
  Administered 2018-05-19: 2 g via INTRAVENOUS
  Filled 2018-05-19: qty 2

## 2018-05-19 MED ORDER — KETOROLAC TROMETHAMINE 15 MG/ML IJ SOLN
15.0000 mg | Freq: Four times a day (QID) | INTRAMUSCULAR | Status: DC
  Administered 2018-05-19 – 2018-05-20 (×3): 15 mg via INTRAVENOUS
  Filled 2018-05-19 (×3): qty 1

## 2018-05-19 MED ORDER — CHLORHEXIDINE GLUCONATE CLOTH 2 % EX PADS: 6.0000 | MEDICATED_PAD | Freq: Once | CUTANEOUS | Status: DC

## 2018-05-19 MED ORDER — FENTANYL CITRATE (PF) 250 MCG/5ML IJ SOLN
INTRAMUSCULAR | Status: AC
  Filled 2018-05-19: qty 5

## 2018-05-19 MED ORDER — METOPROLOL TARTRATE 5 MG/5ML IV SOLN
2.5000 mg | Freq: Four times a day (QID) | INTRAVENOUS | Status: DC
  Administered 2018-05-19 – 2018-05-23 (×17): 2.5 mg via INTRAVENOUS
  Filled 2018-05-19 (×17): qty 5

## 2018-05-19 MED ORDER — ONDANSETRON HCL 4 MG PO TABS
4.0000 mg | ORAL_TABLET | Freq: Four times a day (QID) | ORAL | Status: DC | PRN
  Administered 2018-05-21: 4 mg via ORAL
  Filled 2018-05-19 (×3): qty 1

## 2018-05-19 MED ORDER — SODIUM CHLORIDE 0.9 % IR SOLN
Status: DC | PRN
  Administered 2018-05-19: 12000 mL

## 2018-05-19 MED ORDER — ACETAMINOPHEN 650 MG RE SUPP: 650.0000 mg | Freq: Four times a day (QID) | RECTAL | Status: DC | PRN

## 2018-05-19 MED ORDER — ONDANSETRON HCL 4 MG/2ML IJ SOLN
INTRAMUSCULAR | Status: DC | PRN
  Administered 2018-05-19: 4 mg via INTRAVENOUS

## 2018-05-19 MED ORDER — LACTATED RINGERS IV SOLN
INTRAVENOUS | Status: DC | PRN
  Administered 2018-05-19: 10:00:00 via INTRAVENOUS

## 2018-05-19 MED ORDER — PHENYLEPHRINE HCL 10 MG/ML IJ SOLN
INTRAVENOUS | Status: DC | PRN
  Administered 2018-05-19: 25 ug/min via INTRAVENOUS

## 2018-05-19 MED ORDER — FENTANYL CITRATE (PF) 100 MCG/2ML IJ SOLN
INTRAMUSCULAR | Status: AC
  Filled 2018-05-19: qty 4

## 2018-05-19 MED ORDER — OXYCODONE HCL 5 MG PO TABS
5.0000 mg | ORAL_TABLET | ORAL | Status: DC | PRN
  Administered 2018-05-20 – 2018-05-21 (×2): 10 mg via ORAL
  Administered 2018-05-23: 5 mg via ORAL
  Filled 2018-05-19: qty 1
  Filled 2018-05-19 (×3): qty 2

## 2018-05-19 MED ORDER — METOPROLOL TARTARATE 1 MG/ML SYRINGE (5ML)
Status: DC | PRN
  Administered 2018-05-19: 5 mg via INTRAVENOUS

## 2018-05-19 MED ORDER — LABETALOL HCL 5 MG/ML IV SOLN
5.0000 mg | Freq: Once | INTRAVENOUS | Status: AC
  Administered 2018-05-19: 5 mg via INTRAVENOUS

## 2018-05-19 MED ORDER — FENTANYL CITRATE (PF) 100 MCG/2ML IJ SOLN
INTRAMUSCULAR | Status: AC
  Filled 2018-05-19: qty 2

## 2018-05-19 MED ORDER — PANTOPRAZOLE SODIUM 20 MG PO TBEC
20.0000 mg | DELAYED_RELEASE_TABLET | Freq: Every day | ORAL | Status: DC
  Administered 2018-05-20 – 2018-05-23 (×3): 20 mg via ORAL
  Filled 2018-05-19 (×7): qty 1

## 2018-05-19 MED ORDER — PHENYLEPHRINE HCL 10 MG/ML IJ SOLN
INTRAMUSCULAR | Status: AC
  Filled 2018-05-19: qty 1

## 2018-05-19 MED ORDER — ROCURONIUM BROMIDE 10 MG/ML (PF) SYRINGE
PREFILLED_SYRINGE | INTRAVENOUS | Status: AC
  Filled 2018-05-19: qty 10

## 2018-05-19 MED ORDER — MORPHINE SULFATE (PF) 2 MG/ML IV SOLN
2.0000 mg | INTRAVENOUS | Status: DC | PRN
  Administered 2018-05-19 (×3): 2 mg via INTRAVENOUS
  Filled 2018-05-19 (×3): qty 1
  Filled 2018-05-19 (×2): qty 2

## 2018-05-19 MED ORDER — LABETALOL HCL 5 MG/ML IV SOLN
INTRAVENOUS | Status: AC
  Filled 2018-05-19: qty 4

## 2018-05-19 MED ORDER — ACETAMINOPHEN 500 MG PO TABS
1000.0000 mg | ORAL_TABLET | Freq: Four times a day (QID) | ORAL | Status: AC
  Administered 2018-05-20 (×3): 1000 mg via ORAL
  Filled 2018-05-19 (×4): qty 2

## 2018-05-19 MED ORDER — DIPHENHYDRAMINE HCL 12.5 MG/5ML PO ELIX
12.5000 mg | ORAL_SOLUTION | Freq: Four times a day (QID) | ORAL | Status: DC | PRN
  Administered 2018-05-28 – 2018-05-29 (×2): 12.5 mg via ORAL
  Filled 2018-05-19 (×2): qty 5

## 2018-05-19 MED ORDER — ACETAMINOPHEN 10 MG/ML IV SOLN: 1000.0000 mg | Freq: Once | INTRAVENOUS | Status: DC | PRN

## 2018-05-19 MED ORDER — DIPHENHYDRAMINE HCL 50 MG/ML IJ SOLN
12.5000 mg | Freq: Four times a day (QID) | INTRAMUSCULAR | Status: DC | PRN
  Administered 2018-05-25 – 2018-05-28 (×2): 12.5 mg via INTRAVENOUS
  Filled 2018-05-19 (×2): qty 1

## 2018-05-19 MED ORDER — DEXAMETHASONE SODIUM PHOSPHATE 10 MG/ML IJ SOLN
INTRAMUSCULAR | Status: DC | PRN
  Administered 2018-05-19: 10 mg via INTRAVENOUS

## 2018-05-19 MED ORDER — DEXAMETHASONE SODIUM PHOSPHATE 10 MG/ML IJ SOLN
INTRAMUSCULAR | Status: AC
  Filled 2018-05-19: qty 1

## 2018-05-19 MED ORDER — HYDROMORPHONE HCL 1 MG/ML IJ SOLN
INTRAMUSCULAR | Status: DC | PRN
  Administered 2018-05-19 (×2): 1 mg via INTRAVENOUS

## 2018-05-19 MED ORDER — LACTATED RINGERS IV SOLN
INTRAVENOUS | Status: DC
  Administered 2018-05-19 (×3): via INTRAVENOUS

## 2018-05-19 MED ORDER — PHENYLEPHRINE 40 MCG/ML (10ML) SYRINGE FOR IV PUSH (FOR BLOOD PRESSURE SUPPORT)
PREFILLED_SYRINGE | INTRAVENOUS | Status: AC
  Filled 2018-05-19: qty 10

## 2018-05-19 MED ORDER — MORPHINE SULFATE (PF) 2 MG/ML IV SOLN
1.0000 mg | INTRAVENOUS | Status: DC | PRN
  Administered 2018-05-19: 4 mg via INTRAVENOUS
  Administered 2018-05-20 (×3): 2 mg via INTRAVENOUS
  Administered 2018-05-21 (×2): 4 mg via INTRAVENOUS
  Administered 2018-05-21: 2 mg via INTRAVENOUS
  Administered 2018-05-21 – 2018-05-26 (×12): 4 mg via INTRAVENOUS
  Administered 2018-05-26 – 2018-05-29 (×9): 2 mg via INTRAVENOUS
  Filled 2018-05-19: qty 1
  Filled 2018-05-19: qty 2
  Filled 2018-05-19: qty 1
  Filled 2018-05-19: qty 2
  Filled 2018-05-19 (×3): qty 1
  Filled 2018-05-19: qty 2
  Filled 2018-05-19: qty 1
  Filled 2018-05-19 (×3): qty 2
  Filled 2018-05-19 (×5): qty 1
  Filled 2018-05-19: qty 2
  Filled 2018-05-19 (×2): qty 1
  Filled 2018-05-19 (×3): qty 2
  Filled 2018-05-19: qty 1
  Filled 2018-05-19 (×2): qty 2
  Filled 2018-05-19: qty 1
  Filled 2018-05-19: qty 2
  Filled 2018-05-19: qty 1

## 2018-05-19 MED ORDER — PROPOFOL 10 MG/ML IV BOLUS
INTRAVENOUS | Status: DC | PRN
  Administered 2018-05-19: 150 mg via INTRAVENOUS

## 2018-05-19 MED ORDER — LIDOCAINE 2% (20 MG/ML) 5 ML SYRINGE
INTRAMUSCULAR | Status: DC | PRN
  Administered 2018-05-19: 100 mg via INTRAVENOUS

## 2018-05-19 MED ORDER — ALUM & MAG HYDROXIDE-SIMETH 200-200-20 MG/5ML PO SUSP
30.0000 mL | Freq: Four times a day (QID) | ORAL | Status: DC | PRN
  Filled 2018-05-19: qty 30

## 2018-05-19 MED ORDER — ENOXAPARIN SODIUM 40 MG/0.4ML ~~LOC~~ SOLN
40.0000 mg | SUBCUTANEOUS | Status: DC
  Administered 2018-05-20 – 2018-05-29 (×10): 40 mg via SUBCUTANEOUS
  Filled 2018-05-19 (×10): qty 0.4

## 2018-05-19 MED ORDER — SUGAMMADEX SODIUM 200 MG/2ML IV SOLN
INTRAVENOUS | Status: DC | PRN
  Administered 2018-05-19: 200 mg via INTRAVENOUS

## 2018-05-19 MED ORDER — ONDANSETRON HCL 4 MG/2ML IJ SOLN
INTRAMUSCULAR | Status: AC
  Filled 2018-05-19: qty 2

## 2018-05-19 SURGICAL SUPPLY — 47 items
APL SWBSTK 6 STRL LF DISP (MISCELLANEOUS)
APPLICATOR COTTON TIP 6 STRL (MISCELLANEOUS) ×1 IMPLANT
APPLICATOR COTTON TIP 6IN STRL (MISCELLANEOUS) IMPLANT
BLADE EXTENDED COATED 6.5IN (ELECTRODE) ×1 IMPLANT
BLADE HEX COATED 2.75 (ELECTRODE) ×2 IMPLANT
COVER MAYO STAND STRL (DRAPES) IMPLANT
COVER SURGICAL LIGHT HANDLE (MISCELLANEOUS) ×3 IMPLANT
DRAPE LAPAROSCOPIC ABDOMINAL (DRAPES) ×2 IMPLANT
DRAPE WARM FLUID 44X44 (DRAPE) IMPLANT
DRSG OPSITE POSTOP 4X10 (GAUZE/BANDAGES/DRESSINGS) ×1 IMPLANT
DRSG OPSITE POSTOP 4X12 (GAUZE/BANDAGES/DRESSINGS) ×1 IMPLANT
ELECT PENCIL ROCKER SW 15FT (MISCELLANEOUS) ×1 IMPLANT
ELECT REM PT RETURN 15FT ADLT (MISCELLANEOUS) ×2 IMPLANT
GAUZE SPONGE 4X4 12PLY STRL (GAUZE/BANDAGES/DRESSINGS) ×1 IMPLANT
GLOVE BIOGEL PI IND STRL 7.0 (GLOVE) ×1 IMPLANT
GLOVE BIOGEL PI INDICATOR 7.0 (GLOVE) ×1
GLOVE EUDERMIC 7 POWDERFREE (GLOVE) ×3 IMPLANT
GOWN STRL REUS W/TWL LRG LVL3 (GOWN DISPOSABLE) ×7 IMPLANT
GOWN STRL REUS W/TWL XL LVL3 (GOWN DISPOSABLE) ×4 IMPLANT
HANDLE SUCTION POOLE (INSTRUMENTS) IMPLANT
KIT BASIN OR (CUSTOM PROCEDURE TRAY) ×1 IMPLANT
LIGASURE IMPACT 36 18CM CVD LR (INSTRUMENTS) ×1 IMPLANT
NS IRRIG 1000ML POUR BTL (IV SOLUTION) ×2 IMPLANT
PACK COLON (CUSTOM PROCEDURE TRAY) ×1 IMPLANT
PACK GENERAL/GYN (CUSTOM PROCEDURE TRAY) ×1 IMPLANT
RELOAD PROXIMATE 75MM BLUE (ENDOMECHANICALS) ×4 IMPLANT
RELOAD STAPLE 75 3.8 BLU REG (ENDOMECHANICALS) IMPLANT
SPONGE LAP 18X18 RF (DISPOSABLE) ×4 IMPLANT
STAPLER GUN LINEAR PROX 60 (STAPLE) ×1 IMPLANT
STAPLER PROXIMATE 75MM BLUE (STAPLE) ×1 IMPLANT
STAPLER VISISTAT 35W (STAPLE) ×1 IMPLANT
SUCTION POOLE HANDLE (INSTRUMENTS) ×2
SUT PDS AB 1 CTX 36 (SUTURE) IMPLANT
SUT PDS AB 1 TP1 96 (SUTURE) ×4 IMPLANT
SUT SILK 2 0 (SUTURE) ×4
SUT SILK 2 0 SH CR/8 (SUTURE) ×2 IMPLANT
SUT SILK 2-0 18XBRD TIE 12 (SUTURE) IMPLANT
SUT SILK 2-0 30XBRD TIE 12 (SUTURE) IMPLANT
SUT SILK 3 0 (SUTURE) ×2
SUT SILK 3 0 SH CR/8 (SUTURE) ×1 IMPLANT
SUT SILK 3-0 18XBRD TIE 12 (SUTURE) IMPLANT
SUT VICRYL 2 0 18  UND BR (SUTURE)
SUT VICRYL 2 0 18 UND BR (SUTURE) IMPLANT
TOWEL OR 17X26 10 PK STRL BLUE (TOWEL DISPOSABLE) ×2 IMPLANT
TRAY FOLEY MTR SLVR 16FR STAT (SET/KITS/TRAYS/PACK) ×1 IMPLANT
TUBING CONNECTING 10 (TUBING) ×2 IMPLANT
YANKAUER SUCT BULB TIP NO VENT (SUCTIONS) ×1 IMPLANT

## 2018-05-19 NOTE — Anesthesia Preprocedure Evaluation (Addendum)
Anesthesia Evaluation  Patient identified by MRN, date of birth, ID band Patient awake    Reviewed: Allergy & Precautions, NPO status , Patient's Chart, lab work & pertinent test results  Airway Mallampati: II  TM Distance: >3 FB Neck ROM: Full    Dental no notable dental hx. (+) Upper Dentures, Lower Dentures   Pulmonary former smoker,    Pulmonary exam normal breath sounds clear to auscultation       Cardiovascular hypertension, + CAD and + Past MI  Normal cardiovascular exam Rhythm:Regular Rate:Normal     Neuro/Psych  Neuromuscular disease    GI/Hepatic Neg liver ROS, GERD  ,  Endo/Other  negative endocrine ROS  Renal/GU negative Renal ROS     Musculoskeletal negative musculoskeletal ROS (+)   Abdominal   Peds  Hematology  (+) anemia ,   Anesthesia Other Findings   Reproductive/Obstetrics                            Lab Results  Component Value Date   CREATININE 1.00 05/19/2018   BUN 18 05/19/2018   NA 141 05/19/2018   K 3.5 05/19/2018   CL 104 05/19/2018   CO2 27 05/19/2018    Lab Results  Component Value Date   WBC 10.1 05/18/2018   HGB 16.2 05/18/2018   HCT 47.6 05/18/2018   MCV 87.5 05/18/2018   PLT 216 05/18/2018    Anesthesia Physical Anesthesia Plan  ASA: III  Anesthesia Plan: General   Post-op Pain Management:    Induction: Intravenous  PONV Risk Score and Plan: Treatment may vary due to age or medical condition, Ondansetron and Dexamethasone  Airway Management Planned: Oral ETT  Additional Equipment:   Intra-op Plan:   Post-operative Plan: Extubation in OR  Informed Consent: I have reviewed the patients History and Physical, chart, labs and discussed the procedure including the risks, benefits and alternatives for the proposed anesthesia with the patient or authorized representative who has indicated his/her understanding and acceptance.   Dental  advisory given  Plan Discussed with: CRNA  Anesthesia Plan Comments:       Anesthesia Quick Evaluation

## 2018-05-19 NOTE — Interval H&P Note (Signed)
History and Physical Interval Note:  05/19/2018 10:08 AM  Vincent Mcclure  has presented today for surgery, with the diagnosis of obstructed right colon  The various methods of treatment have been discussed with the patient and family. After consideration of risks, benefits and other options for treatment, the patient has consented to  Procedure(s): EXPLORATORY LAPAROTOMY, segmental colectomy,  (N/A) as a surgical intervention .  The patient's history has been reviewed, patient examined, no change in status, stable for surgery.  I have reviewed the patient's chart and labs.  Questions were answered to the patient's satisfaction.     [name]

## 2018-05-19 NOTE — Transfer of Care (Signed)
Immediate Anesthesia Transfer of Care Note  Patient: Vincent Mcclure  Procedure(s) Performed: EXPLORATORY LAPAROTOMY RIGHT COLECTOMY (N/A Abdomen)  Patient Location: PACU  Anesthesia Type:General  Level of Consciousness: awake, alert  and oriented  Airway & Oxygen Therapy: Patient Spontanous Breathing and Patient connected to face mask oxygen  Post-op Assessment: Report given to RN and Post -op Vital signs reviewed and stable  Post vital signs: Reviewed and stable  Last Vitals:  Vitals Value Taken Time  BP 150/103 05/19/2018  1:00 PM  Temp    Pulse 106 05/19/2018  1:02 PM  Resp 12 05/19/2018  1:02 PM  SpO2 100 % 05/19/2018  1:02 PM  Vitals shown include unvalidated device data.  Last Pain:  Vitals:   05/19/18 0957  TempSrc: Oral  PainSc: 10-Worst pain ever         Complications: No apparent anesthesia complications

## 2018-05-19 NOTE — Anesthesia Procedure Notes (Signed)
Procedure Name: Intubation Date/Time: 05/19/2018 10:30 AM Performed by: Maxwell Caul, CRNA Pre-anesthesia Checklist: Patient identified, Emergency Drugs available, Suction available and Patient being monitored Patient Re-evaluated:Patient Re-evaluated prior to induction Oxygen Delivery Method: Circle system utilized Preoxygenation: Pre-oxygenation with 100% oxygen Induction Type: IV induction, Rapid sequence and Cricoid Pressure applied Laryngoscope Size: Mac and 4 Grade View: Grade I Tube type: Oral Tube size: 7.5 mm Number of attempts: 1 Airway Equipment and Method: Stylet Placement Confirmation: ETT inserted through vocal cords under direct vision,  positive ETCO2 and breath sounds checked- equal and bilateral Secured at: 21 cm Tube secured with: Tape Dental Injury: Teeth and Oropharynx as per pre-operative assessment

## 2018-05-19 NOTE — Consult Note (Signed)
Reason for Consult:colon mass Referring Physician: Dr Marin Olp is an 84 y.o. male.  HPI: 84 y.o. M with h/o PAD, HTN, AF and colon polyps who presented to [facility] with nausea and vomiting.  His last BM was 3 days ago.  His last colonoscopy was 2010.  He is off anticoagulation for his AF due to GI bleeding.    Past Medical History:  Diagnosis Date  . Alcohol abuse   . Anemia   . Chronic low back pain 10/14/2011  . Colon polyps    hyperplastic (2004, 2010) and adenomatous (1990).    . COLONIC POLYPS, HX OF 03/05/2008  . Esophageal stricture    hx of  . Gastric ulcer   . GERD 03/05/2008  . GERD (gastroesophageal reflux disease) 1994   associated peptic strictures  . HYPERLIPIDEMIA 03/05/2008  . HYPERTENSION 03/05/2008  . Hypertension   . IBS 03/05/2008  . Impaired glucose tolerance 10/12/2011  . Iron deficiency anemia   . LEG CRAMPS 09/02/2010  . PAD (peripheral artery disease) ([place]) 10/14/2011  . PAD (peripheral artery disease) ([place]) 2013  . PERIPHERAL EDEMA 09/02/2010  . PERIPHERAL NEUROPATHY 03/05/2008  . Prostatitis    hx of  . VARICOSE VEINS, LOWER EXTREMITIES 06/09/2009    Past Surgical History:  Procedure Laterality Date  . CATARACT EXTRACTION  bilat  . CORONARY ARTERY BYPASS GRAFT N/A 02/15/2017   Procedure: CORONARY ARTERY BYPASS GRAFTING times three  with left internal mammary harvest and endoscopic harvest of Right SVG. Grafts of LIMA to  LAD, SVG to Distal Circ, and to First Diag.;  Surgeon: Grace Isaac, MD;  Location: [place];  Service: Open Heart Surgery;  Laterality: N/A;  . ESOPHAGOGASTRODUODENOSCOPY N/A 11/14/2014   Procedure: ESOPHAGOGASTRODUODENOSCOPY (EGD);  Surgeon: Jerene Bears, MD;  Location: [facility] ENDOSCOPY;  Service: Endoscopy;  Laterality: N/A;  . LEFT HEART CATH AND CORONARY ANGIOGRAPHY N/A 02/14/2017   Procedure: Left Heart Cath and Coronary Angiography;  Surgeon: Belva Crome, MD;  Location: [place] CV LAB;  Service: Cardiovascular;  Laterality:  N/A;  . LUMBAR SPINE SURGERY  11/2008   Dr Joya Salm  . TEE WITHOUT CARDIOVERSION N/A 02/15/2017   Procedure: TRANSESOPHAGEAL ECHOCARDIOGRAM (TEE);  Surgeon: Grace Isaac, MD;  Location: [place];  Service: Open Heart Surgery;  Laterality: N/A;    Family History  Problem Relation Age of Onset  . Cancer Mother        Brain Cancer  . Diabetes Father   . Heart disease Father        CAD  . Hypertension Father   . Stroke Father   . Diabetes Sister   . Stomach cancer Neg Hx   . Pancreatic cancer Neg Hx   . Colon cancer Neg Hx   . Esophageal cancer Neg Hx     Social History:  reports that he quit smoking about 9 years ago. His smoking use included cigarettes. He has a 62.50 pack-year smoking history. He has quit using smokeless tobacco.  His smokeless tobacco use included chew. He reports that he drinks about 6.0 standard drinks of alcohol per week. He reports that he does not use drugs.  Allergies:  Allergies  Allergen Reactions  . No Known Allergies     Medications: I have reviewed the patient's current medications.  Results for orders placed or performed during the hospital encounter of 05/18/18 (from the past 48 hour(s))  CBC with Differential     Status: Abnormal   Collection Time:  05/18/18  6:51 PM  Result Value Ref Range   WBC 10.1 4.0 - 10.5 K/uL   RBC 5.44 4.22 - 5.81 MIL/uL   Hemoglobin 16.2 13.0 - 17.0 g/dL   HCT 47.6 39.0 - 52.0 %   MCV 87.5 78.0 - 100.0 fL   MCH 29.8 26.0 - 34.0 pg   MCHC 34.0 30.0 - 36.0 g/dL   RDW 14.1 11.5 - 15.5 %   Platelets 216 150 - 400 K/uL   Neutrophils Relative % 91 %   Neutro Abs 9.1 (H) 1.7 - 7.7 K/uL   Lymphocytes Relative 4 %   Lymphs Abs 0.4 (L) 0.7 - 4.0 K/uL   Monocytes Relative 5 %   Monocytes Absolute 0.6 0.1 - 1.0 K/uL   Eosinophils Relative 0 %   Eosinophils Absolute 0.0 0.0 - 0.7 K/uL   Basophils Relative 0 %   Basophils Absolute 0.0 0.0 - 0.1 K/uL    Comment: Performed at [facility], [address]  [place], [place] [address]  Comprehensive metabolic panel     Status: Abnormal   Collection Time: 05/18/18  6:51 PM  Result Value Ref Range   Sodium 138 135 - 145 mmol/L   Potassium 3.9 3.5 - 5.1 mmol/L   Chloride 97 (L) 98 - 111 mmol/L   CO2 26 22 - 32 mmol/L   Glucose, Bld 183 (H) 70 - 99 mg/dL   BUN 15 8 - 23 mg/dL   Creatinine, Ser 1.14 0.61 - 1.24 mg/dL   Calcium 9.8 8.9 - 10.3 mg/dL   Total Protein 7.2 6.5 - 8.1 g/dL   Albumin 4.2 3.5 - 5.0 g/dL   AST 28 15 - 41 U/L   ALT 11 0 - 44 U/L   Alkaline Phosphatase 90 38 - 126 U/L   Total Bilirubin 0.9 0.3 - 1.2 mg/dL   GFR calc non Af Amer 57 (L) >60 mL/min   GFR calc Af Amer >60 >60 mL/min    Comment: (NOTE) The eGFR has been calculated using the CKD EPI equation. This calculation has not been validated in all clinical situations. eGFR's persistently <60 mL/min signify possible Chronic Kidney Disease.    Anion gap 15 5 - 15    Comment: Performed at [facility], [address] [place], [place] [address]  Lipase, blood     Status: None   Collection Time: 05/18/18  6:51 PM  Result Value Ref Range   Lipase 38 11 - 51 U/L    Comment: Performed at [facility], [address] [place], [place] [address]  Basic metabolic panel     Status: Abnormal   Collection Time: 05/19/18  4:29 AM  Result Value Ref Range   Sodium 141 135 - 145 mmol/L   Potassium 3.5 3.5 - 5.1 mmol/L   Chloride 104 98 - 111 mmol/L   CO2 27 22 - 32 mmol/L   Glucose, Bld 156 (H) 70 - 99 mg/dL   BUN 18 8 - 23 mg/dL   Creatinine, Ser 1.00 0.61 - 1.24 mg/dL   Calcium 9.1 8.9 - 10.3 mg/dL   GFR calc non Af Amer >60 >60 mL/min   GFR calc Af Amer >60 >60 mL/min    Comment: (NOTE) The eGFR has been calculated using the CKD EPI equation. This calculation has not been validated in all clinical situations. eGFR's persistently <60 mL/min signify possible Chronic Kidney Disease.    Anion gap 10 5 -  15    Comment: Performed at [facility], [place] [address] [place], [place] [address]    Ct Abdomen Pelvis W Contrast  Result Date: 05/18/2018 CLINICAL DATA:  84 year old male with history of bloating and constipation for the past 2 days. Emesis today. EXAM: CT ABDOMEN AND PELVIS WITH CONTRAST TECHNIQUE: Multidetector CT imaging of the abdomen and pelvis was performed using the standard protocol following bolus administration of intravenous contrast. CONTRAST:  128m ISOVUE-300 IOPAMIDOL (ISOVUE-300) INJECTION 61% COMPARISON:  CT the abdomen and pelvis 05/22/2009. FINDINGS: Lower chest: Aortic atherosclerosis. Atherosclerotic calcifications noted in the left main, left anterior descending, left circumflex and right coronary arteries. Thickening calcification of the aortic valve. Lipomatous hypertrophy of the interatrial septum (normal anatomical variant) incidentally noted. Status post median sternotomy for CABG. Hepatobiliary: No cystic or solid hepatic lesions. No intra or extrahepatic biliary ductal dilatation. Gallbladder is normal in appearance. Pancreas: No pancreatic mass. No pancreatic ductal dilatation. No pancreatic or peripancreatic fluid or inflammatory changes. Spleen: Calcified granulomas throughout the spleen. Adrenals/Urinary Tract: 2.4 cm left adrenal nodule (axial image 35 of series 2), stable compared to remote prior examinations, previously characterized as an adenoma. Right adrenal gland is unremarkable. Multiple subcentimeter low-attenuation lesions in the left kidney, too small to characterize, but statistically likely to represent tiny cysts. 1.4 cm simple cyst in the interpolar region of the left kidney. Vascular calcifications associated with both kidneys. Right kidney is otherwise unremarkable in appearance. No hydroureteronephrosis. Urinary bladder is normal in appearance. Stomach/Bowel: The appearance of the stomach is unremarkable. In the region of the hepatic flexure there is a mass-like area of mural thickening  and luminal narrowing which measures approximately 4.7 x 4.5 x 4.7 cm (axial image 40 of series 2 and sagittal image 110 of series 6), highly concerning for primary colonic neoplasm. Slight haziness in the surrounding soft tissues is noted. Proximal to this there is dilatation of the cecum and ascending colon with liquid stool. More distal aspect of the colon and rectum are relatively decompressed. Multiple prominent borderline dilated loops of small bowel measuring up to 3.8 cm. Normal appendix. Vascular/Lymphatic: Aortic atherosclerosis, without evidence of aneurysm or dissection in the abdominal or pelvic vasculature. No lymphadenopathy noted in the abdomen or pelvis. Reproductive: Prostate gland and seminal vesicles are unremarkable in appearance. Other: No significant volume of ascites.  No pneumoperitoneum. Musculoskeletal: There are no aggressive appearing lytic or blastic lesions noted in the visualized portions of the skeleton. Median sternotomy wires. IMPRESSION: 1. There appears to be a nearly obstructing lesion in the region of the hepatic flexure of the colon which is highly concerning for primary colonic neoplasm. Slight haziness of the surrounding soft tissues may be indicative of early local invasion. This appears partially obstructive as evidenced by dilatation of more proximal aspects of the small bowel and colon. 2. Left adrenal lesion is stable compared to prior studies, previously characterized as an adenoma. 3. Aortic atherosclerosis, in addition to left main and 3 vessel coronary artery disease. Status post median sternotomy for CABG. 4. Additional incidental findings, as above. Aortic Atherosclerosis (ICD10-I70.0). Electronically Signed   By: DVinnie LangtonM.D.   On: 05/18/2018 22:07   Dg Abdomen Acute W/chest  Result Date: 05/18/2018 CLINICAL DATA:  84year old male with history of constipation, bloating and nausea for the past 4 days. EXAM: DG ABDOMEN ACUTE W/ 1V CHEST COMPARISON:   Chest x-ray 09/14/2017. FINDINGS: Lung volumes are normal. No consolidative airspace disease. No pleural effusions. No pneumothorax. No  pulmonary nodule or mass noted. Pulmonary vasculature and the cardiomediastinal silhouette are within normal limits. Low aortic atherosclerosis. Status post median sternotomy. Multiple dilated loops of gas-filled small bowel are noted measuring up to 5.7 cm in diameter with several air-fluid levels noted on the upright projection. Gas and stool is noted throughout the colon extending to the level of the distal rectum. No pneumoperitoneum. IMPRESSION: 1. Nonspecific bowel gas pattern, as above. Findings could either reflect early or partial small bowel obstruction, or could be indicative of ileus. 2. No pneumoperitoneum. 3. No radiographic evidence of acute cardiopulmonary disease. 4. Aortic atherosclerosis. Electronically Signed   By: Vinnie Langton M.D.   On: 05/18/2018 20:19    Review of Systems  Constitutional: Negative for chills and fever.  Eyes: Negative for blurred vision.  Respiratory: Negative for cough and shortness of breath.   Cardiovascular: Negative for chest pain and palpitations.  Gastrointestinal: Positive for constipation, nausea and vomiting. Negative for abdominal pain.  Genitourinary: Negative for dysuria, frequency and urgency.  Skin: Negative for itching and rash.  Neurological: Negative for dizziness and headaches.   Blood pressure (!) 149/90, pulse 77, temperature 98.7 F (37.1 C), temperature source Oral, resp. rate 20, height _0  (1.803 m), weight 97.9 kg, SpO2 96 %. Physical Exam  Constitutional: He is oriented to person, place, and time. He appears well-developed and well-nourished.  HENT:  Head: Normocephalic and atraumatic.  Eyes: Pupils are equal, round, and reactive to light. Conjunctivae and EOM are normal.  Neck: Normal range of motion. Neck supple.  Cardiovascular: Normal rate.  Respiratory: Effort normal. No respiratory  distress.  Musculoskeletal: Normal range of motion.  Neurological: He is alert and oriented to person, place, and time.  Skin: Skin is warm and dry.    Assessment/Plan: 84 y.o. M with obstructing colon mass.  I do not believe he will tolerating a prep for a colonoscopy.  We have recommended a colon resection.  Hopefully this can be accomplished later today.    Shenee Wignall C. 01/16/1858, 0:93 AM

## 2018-05-19 NOTE — Progress Notes (Signed)
Surgery:  I have interviewed and examined this patient this morning  84 year old man with history of PAD, CAD, CABG 1 year ago with good result.  Followed by Dr. Meda Coffee from cardiology Had echo and TEE last year.  EF 55 to 60%.  Has no anginal symptoms or exertional dyspnea.  Hypertension.  Atrial fibrillation. Presented with nausea vomiting and abdominal distention. He does not take anticoagulation for his A. fib due to history of GI bleed  Exam reveals mild abdominal distention but the abdomen is soft.  I do not see any scars or hernias.  I do not feel any mass.  Subjectively a little bit uncomfortable to palpate right lower quadrant CT scan shows obstructing mass around hepatic flexure with cecal dilatation.  Assessment/plan: Obstructing mass of hepatic flexure, likely adenocarcinoma There is no value for colonoscopy as he would not prep well CEA requested Recommend proceeding with laparotomy and right colectomy today to avoid perforation  Cardiac wise he seems stable and well worked up and should be acceptable risk for what is an inevitable operation today  I have discussed the indications, details, techniques, and numerous risk of the surgery with him.  He is aware of the risk of bleeding, infection, wound healing problems such as hernia or dehiscence, injury to adjacent organs requiring major reconstructive surgery, anastomotic leak requiring colostomy.  He knows there is a small chance of colostomy today but hopefully will get a primary anastomosis.  Also risk of cardiac, pulmonary, and thromboembolic problems.  He understands all of these issues.  All of his questions were answered.  He agrees with this plan.   Vincent Mcclure. Dalbert Batman, M.D., [facility] General and Minimally invasive Surgery Breast and Colorectal Surgery Office:   [phone] Pager:   [phone]

## 2018-05-19 NOTE — Op Note (Signed)
Patient Name:           Vincent Mcclure   Date of Surgery:        05/19/2018  Pre op Diagnosis:      Mass hepatic flexure of colon with obstruction, suspect neoplasia  Post op Diagnosis:    Same  Procedure:                 Exploratory laparotomy, right colectomy with primary anastomosis  Surgeon:                     Edsel Petrin. Dalbert Batman, M.D., FACS  Assistant:                      Saverio Danker, PA   Indication for Assistant: Large, distended patient with difficult exposure.  Assistant needed for exposure to reduce incidence of injury and complications  Operative Indications:   This is an 84 year old man with a history of peripheral arterial disease, heart disease, coronary artery bypass grafting 1 year ago with good result.  Hypertension.  Atrial fibrillation.  He presented with nausea vomiting and abdominal distention.  On exam he is distended but not tender.  CT scan shows obstructing mass at the hepatic flexure of the colon but no sign of any metastatic disease.  The right colon and cecum were significantly dilated.  He is brought to the operating room on the morning of admission for resection  Operative Findings:       There was a palpable mass in the hepatic flexure.  The right colon and cecum were markedly dilated.  The small bowel was somewhat dilated.  There was no sign of any abnormal mesenteric lymph nodes.  The liver was visually and palpably normal.  There is no evidence of perforation or ischemia  Procedure in Detail:          Following the induction of general endotracheal anesthesia a Foley catheter was placed.  The abdomen was prepped and draped in a sterile fashion.  Oral gastric tube was placed.  Surgical timeout was performed.  Intravenous antibiotics were given.      Midline laparotomy incision was made at the fascia was incised in the midline.  The abdomen was entered and explored.  Self-retaining retractors were placed.  I mobilized the right colon from lateral to medial by  dividing its lateral peritoneal attachments.  I  continue the dissection and mobilization around the tip of the cecum.  I took the hepatic flexure down as well.  I very carefully mobilized from lateral to medial identifying and preserving the duodenum.  The terminal ileum was transected with a GIA stapling device.  The mid transverse colon was transected with a GIA stapling device.  I took the entire mesentery down as far posterior as possible.  Larger vessels were clamped and divided and doubly ligated with 2-0 silk ties.  The rest of the mesentery was divided with the LigaSure device.  The specimen was removed and sent to pathology.  Anastomosis was created between the terminal ileum and the mid transverse colon just  to the right of the middle colic vessels.  Anastomosis was created with a GIA stapling device and the common defect was closed with a TA 60 stapling device.  The colon and small bowel were pink with excellent blood supply.  Mesentery was closed with numerous interrupted figure-of-eight sutures of 2-0 silk.  The abdomen was irrigated.     At this point we  changed our gowns and gloves, instruments and drapes.  I further irrigated the abdomen.  I checked the anastomosis.  I checked for bleeding and there was none.  The omentum was brought down.  The midline fascia was closed with a running suture of #1, double-stranded PDS.  The skin incision was closed with skin staples.  Honeycomb bandage was placed.  The patient tolerated the procedure well was taken to PACU in stable condition.  EBL 100 cc.  Counts correct.  Complications none.     Edsel Petrin. Dalbert Batman, M.D., FACS General and Minimally Invasive Surgery Breast and Colorectal Surgery  05/19/2018 12:46 PM

## 2018-05-19 NOTE — Anesthesia Postprocedure Evaluation (Signed)
Anesthesia Post Note  Patient: Vincent Mcclure  Procedure(s) Performed: EXPLORATORY LAPAROTOMY RIGHT COLECTOMY (N/A Abdomen)     Patient location during evaluation: PACU Anesthesia Type: General Level of consciousness: awake and alert Pain management: pain level controlled Vital Signs Assessment: post-procedure vital signs reviewed and stable Respiratory status: spontaneous breathing, nonlabored ventilation, respiratory function stable and patient connected to nasal cannula oxygen Cardiovascular status: blood pressure returned to baseline and stable Postop Assessment: no apparent nausea or vomiting Anesthetic complications: no    Last Vitals:  Vitals:   05/19/18 1330 05/19/18 1345  BP: (!) 115/91 120/79  Pulse: (!) 109 (!) 108  Resp: 18 20  Temp:    SpO2: 94% 96%    Last Pain:  Vitals:   05/19/18 1400  TempSrc:   PainSc: [place]

## 2018-05-19 NOTE — H&P (Signed)
History and Physical    Vincent Mcclure XTG:[id number] DOB: 06/28/1932 DOA: 05/18/2018  PCP: [name], DO  Patient coming from: Home  I have personally briefly reviewed patient's old medical records in [place]  Chief Complaint: Abd pain  HPI: Vincent Mcclure is a 84 y.o. male with medical history significant of CAD s/p CABG in 2018, PAF not on anticoagulation due to h/o GIB in past.  Patient presents to ED with onset of Abd pain, distention, N/V onset over past 2 days.  Last BM x3 days ago he says.  Pain is crampy, intermittent.  Vomiting makes it better.   ED Course: CT reveals Mass at hepatic flexure of colon causing colonic obstruction.  Dr. Marcello Moores consulted and wants medicine to admit.  On ROS, hasnt really had CP or SOB since the CABG in 2018 at all.  Cant walk up a flight of stairs due to leg weakness, but doesn't get chest pain with walking he says.   Review of Systems: As per HPI otherwise 10 point review of systems negative.   Past Medical History:  Diagnosis Date  . Alcohol abuse   . Anemia   . Chronic low back pain 10/14/2011  . Colon polyps    hyperplastic (2004, 2010) and adenomatous (1990).    . COLONIC POLYPS, HX OF 03/05/2008  . Esophageal stricture    hx of  . Gastric ulcer   . GERD 03/05/2008  . GERD (gastroesophageal reflux disease) 1994   associated peptic strictures  . HYPERLIPIDEMIA 03/05/2008  . HYPERTENSION 03/05/2008  . Hypertension   . IBS 03/05/2008  . Impaired glucose tolerance 10/12/2011  . Iron deficiency anemia   . LEG CRAMPS 09/02/2010  . PAD (peripheral artery disease) ([place]) 10/14/2011  . PAD (peripheral artery disease) ([place]) 2013  . PERIPHERAL EDEMA 09/02/2010  . PERIPHERAL NEUROPATHY 03/05/2008  . Prostatitis    hx of  . VARICOSE VEINS, LOWER EXTREMITIES 06/09/2009    Past Surgical History:  Procedure Laterality Date  . CATARACT EXTRACTION  bilat  . CORONARY ARTERY BYPASS GRAFT N/A 02/15/2017   Procedure: CORONARY ARTERY  BYPASS GRAFTING times three  with left internal mammary harvest and endoscopic harvest of Right SVG. Grafts of LIMA to  LAD, SVG to Distal Circ, and to First Diag.;  Surgeon: Grace Isaac, MD;  Location: [facility];  Service: Open Heart Surgery;  Laterality: N/A;  . ESOPHAGOGASTRODUODENOSCOPY N/A 11/14/2014   Procedure: ESOPHAGOGASTRODUODENOSCOPY (EGD);  Surgeon: Jerene Bears, MD;  Location: [facility] ENDOSCOPY;  Service: Endoscopy;  Laterality: N/A;  . LEFT HEART CATH AND CORONARY ANGIOGRAPHY N/A 02/14/2017   Procedure: Left Heart Cath and Coronary Angiography;  Surgeon: Belva Crome, MD;  Location: [place] CV LAB;  Service: Cardiovascular;  Laterality: N/A;  . LUMBAR SPINE SURGERY  11/2008   Dr Joya Salm  . TEE WITHOUT CARDIOVERSION N/A 02/15/2017   Procedure: TRANSESOPHAGEAL ECHOCARDIOGRAM (TEE);  Surgeon: Grace Isaac, MD;  Location: [place];  Service: Open Heart Surgery;  Laterality: N/A;     reports that he quit smoking about 9 years ago. His smoking use included cigarettes. He has a 62.50 pack-year smoking history. He has quit using smokeless tobacco.  His smokeless tobacco use included chew. He reports that he drinks about 6.0 standard drinks of alcohol per week. He reports that he does not use drugs.  Allergies  Allergen Reactions  . No Known Allergies     Family History  Problem Relation Age of Onset  .  Cancer Mother        Brain Cancer  . Diabetes Father   . Heart disease Father        CAD  . Hypertension Father   . Stroke Father   . Diabetes Sister   . Stomach cancer Neg Hx   . Pancreatic cancer Neg Hx   . Colon cancer Neg Hx   . Esophageal cancer Neg Hx      Prior to Admission medications   Medication Sig Start Date End Date Taking? Authorizing Provider  aspirin EC 81 MG EC tablet Take 1 tablet (81 mg total) by mouth daily. 02/25/17  Yes Conte, Tessa N, PA-C  ferrous sulfate 325 (65 FE) MG EC tablet Take 1 tablet (325 mg total) by mouth 3 (three) times daily with meals.  01/17/18  Yes Opalski, Neoma Laming, DO  furosemide (LASIX) 40 MG tablet Take 1 tablet (40 mg total) by mouth daily. 08/10/17 08/10/18 Yes Dorothy Spark, MD  metoprolol succinate (TOPROL XL) 50 MG 24 hr tablet Take 1 tablet (50 mg total) by mouth daily. Take with or immediately following a meal. 05/24/17  Yes Imogene Burn, PA-C  pravastatin (PRAVACHOL) 40 MG tablet I po q hs Patient taking differently: Take 40 mg by mouth at bedtime. I po q hs 01/17/18  Yes [name], DO    Physical Exam: Vitals:   05/18/18 2251 05/18/18 2344 05/18/18 2345 05/19/18 0057  BP: (!) 155/90  (!) 152/101 (!) 166/98  Pulse: (!) 103  81 100  Resp: 17 17 18 20   Temp:    97.8 F (36.6 C)  TempSrc:    Oral  SpO2: 96%  94% 98%  Weight:    97.9 kg  Height:    5\' 11"  (1.803 m)    Constitutional: NAD, calm, comfortable Eyes: PERRL, lids and conjunctivae normal ENMT: Mucous membranes are moist. Posterior pharynx clear of any exudate or lesions.Normal dentition.  Neck: normal, supple, no masses, no thyromegaly Respiratory: clear to auscultation bilaterally, no wheezing, no crackles. Normal respiratory effort. No accessory muscle use.  Cardiovascular: Regular rate and rhythm, no murmurs / rubs / gallops. No extremity edema. 2+ pedal pulses. No carotid bruits.  Abdomen: no tenderness, no masses palpated. No hepatosplenomegaly. Bowel sounds positive.  Musculoskeletal: no clubbing / cyanosis. No joint deformity upper and lower extremities. Good ROM, no contractures. Normal muscle tone.  Skin: no rashes, lesions, ulcers. No induration Neurologic: CN 2-12 grossly intact. Sensation intact, DTR normal. Strength 5/5 in all 4.  Psychiatric: Normal judgment and insight. Alert and oriented x 3. Normal mood.    Labs on Admission: I have personally reviewed following labs and imaging studies  CBC: Recent Labs  Lab 05/18/18 1851  WBC 10.1  NEUTROABS 9.1*  HGB 16.2  HCT 47.6  MCV 87.5  PLT 701   Basic Metabolic  Panel: Recent Labs  Lab 05/18/18 1851  NA 138  K 3.9  CL 97*  CO2 26  GLUCOSE 183*  BUN 15  CREATININE 1.14  CALCIUM 9.8   GFR: Estimated Creatinine Clearance: 57.5 mL/min (by C-G formula based on SCr of 1.14 mg/dL). Liver Function Tests: Recent Labs  Lab 05/18/18 1851  AST 28  ALT 11  ALKPHOS 90  BILITOT 0.9  PROT 7.2  ALBUMIN 4.2   Recent Labs  Lab 05/18/18 1851  LIPASE 38   No results for input(s): AMMONIA in the last 168 hours. Coagulation Profile: No results for input(s): INR, PROTIME in the last 168 hours.  Cardiac Enzymes: No results for input(s): CKTOTAL, CKMB, CKMBINDEX, TROPONINI in the last 168 hours. BNP (last 3 results) No results for input(s): PROBNP in the last 8760 hours. HbA1C: No results for input(s): HGBA1C in the last 72 hours. CBG: No results for input(s): GLUCAP in the last 168 hours. Lipid Profile: No results for input(s): CHOL, HDL, LDLCALC, TRIG, CHOLHDL, LDLDIRECT in the last 72 hours. Thyroid Function Tests: No results for input(s): TSH, T4TOTAL, FREET4, T3FREE, THYROIDAB in the last 72 hours. Anemia Panel: No results for input(s): VITAMINB12, FOLATE, FERRITIN, TIBC, IRON, RETICCTPCT in the last 72 hours. Urine analysis:    Component Value Date/Time   COLORURINE STRAW (A) 02/14/2017 2049   APPEARANCEUR CLEAR 02/14/2017 2049   LABSPEC 1.006 02/14/2017 2049   PHURINE 6.0 02/14/2017 2049   GLUCOSEU NEGATIVE 02/14/2017 2049   GLUCOSEU NEGATIVE 08/11/2016 1521   HGBUR NEGATIVE 02/14/2017 2049   BILIRUBINUR NEGATIVE 02/14/2017 2049   KETONESUR NEGATIVE 02/14/2017 2049   PROTEINUR NEGATIVE 02/14/2017 2049   UROBILINOGEN 1.0 08/11/2016 1521   NITRITE NEGATIVE 02/14/2017 2049   LEUKOCYTESUR NEGATIVE 02/14/2017 2049    Radiological Exams on Admission: Ct Abdomen Pelvis W Contrast  Result Date: 05/18/2018 CLINICAL DATA:  84 year old male with history of bloating and constipation for the past 2 days. Emesis today. EXAM: CT ABDOMEN AND  PELVIS WITH CONTRAST TECHNIQUE: Multidetector CT imaging of the abdomen and pelvis was performed using the standard protocol following bolus administration of intravenous contrast. CONTRAST:  153mL ISOVUE-300 IOPAMIDOL (ISOVUE-300) INJECTION 61% COMPARISON:  CT the abdomen and pelvis 05/22/2009. FINDINGS: Lower chest: Aortic atherosclerosis. Atherosclerotic calcifications noted in the left main, left anterior descending, left circumflex and right coronary arteries. Thickening calcification of the aortic valve. Lipomatous hypertrophy of the interatrial septum (normal anatomical variant) incidentally noted. Status post median sternotomy for CABG. Hepatobiliary: No cystic or solid hepatic lesions. No intra or extrahepatic biliary ductal dilatation. Gallbladder is normal in appearance. Pancreas: No pancreatic mass. No pancreatic ductal dilatation. No pancreatic or peripancreatic fluid or inflammatory changes. Spleen: Calcified granulomas throughout the spleen. Adrenals/Urinary Tract: 2.4 cm left adrenal nodule (axial image 35 of series 2), stable compared to remote prior examinations, previously characterized as an adenoma. Right adrenal gland is unremarkable. Multiple subcentimeter low-attenuation lesions in the left kidney, too small to characterize, but statistically likely to represent tiny cysts. 1.4 cm simple cyst in the interpolar region of the left kidney. Vascular calcifications associated with both kidneys. Right kidney is otherwise unremarkable in appearance. No hydroureteronephrosis. Urinary bladder is normal in appearance. Stomach/Bowel: The appearance of the stomach is unremarkable. In the region of the hepatic flexure there is a mass-like area of mural thickening and luminal narrowing which measures approximately 4.7 x 4.5 x 4.7 cm (axial image 40 of series 2 and sagittal image 110 of series 6), highly concerning for primary colonic neoplasm. Slight haziness in the surrounding soft tissues is noted.  Proximal to this there is dilatation of the cecum and ascending colon with liquid stool. More distal aspect of the colon and rectum are relatively decompressed. Multiple prominent borderline dilated loops of small bowel measuring up to 3.8 cm. Normal appendix. Vascular/Lymphatic: Aortic atherosclerosis, without evidence of aneurysm or dissection in the abdominal or pelvic vasculature. No lymphadenopathy noted in the abdomen or pelvis. Reproductive: Prostate gland and seminal vesicles are unremarkable in appearance. Other: No significant volume of ascites.  No pneumoperitoneum. Musculoskeletal: There are no aggressive appearing lytic or blastic lesions noted in the visualized portions of the skeleton. Median  sternotomy wires. IMPRESSION: 1. There appears to be a nearly obstructing lesion in the region of the hepatic flexure of the colon which is highly concerning for primary colonic neoplasm. Slight haziness of the surrounding soft tissues may be indicative of early local invasion. This appears partially obstructive as evidenced by dilatation of more proximal aspects of the small bowel and colon. 2. Left adrenal lesion is stable compared to prior studies, previously characterized as an adenoma. 3. Aortic atherosclerosis, in addition to left main and 3 vessel coronary artery disease. Status post median sternotomy for CABG. 4. Additional incidental findings, as above. Aortic Atherosclerosis (ICD10-I70.0). Electronically Signed   By: Vinnie Langton M.D.   On: 05/18/2018 22:07   Dg Abdomen Acute W/chest  Result Date: 05/18/2018 CLINICAL DATA:  84 year old male with history of constipation, bloating and nausea for the past 4 days. EXAM: DG ABDOMEN ACUTE W/ 1V CHEST COMPARISON:  Chest x-ray 09/14/2017. FINDINGS: Lung volumes are normal. No consolidative airspace disease. No pleural effusions. No pneumothorax. No pulmonary nodule or mass noted. Pulmonary vasculature and the cardiomediastinal silhouette are within  normal limits. Low aortic atherosclerosis. Status post median sternotomy. Multiple dilated loops of gas-filled small bowel are noted measuring up to 5.7 cm in diameter with several air-fluid levels noted on the upright projection. Gas and stool is noted throughout the colon extending to the level of the distal rectum. No pneumoperitoneum. IMPRESSION: 1. Nonspecific bowel gas pattern, as above. Findings could either reflect early or partial small bowel obstruction, or could be indicative of ileus. 2. No pneumoperitoneum. 3. No radiographic evidence of acute cardiopulmonary disease. 4. Aortic atherosclerosis. Electronically Signed   By: Vinnie Langton M.D.   On: 05/18/2018 20:19    EKG: Independently reviewed.  Assessment/Plan Principal Problem:   Colonic obstruction ([name]) Active Problems:   HTN (hypertension)   S/P CABG x 3   PAF (paroxysmal atrial fibrillation) ([name])   CAD (coronary artery disease), native coronary artery   Colonic mass    1. Colonic obstruction due to likely neoplasm - 1. Surgery consult 2. NPO 3. IVF 4. Morphine PRN pain 5. zofran PRN nausea 6. If pain or vomiting becomes severe then place NGT 7. Suspect this will end up being non-elective surgical though 2. CAD s/p CABG in May 2018 - 1. Preserved EF at time of CABG, no echo since then 2. But has no symptoms of CHF since that time. 3. EKG pending 3. PAF and HTN- 1. Will convert PO metoprolol to IV Q6H scheduled 2. Hold lasix 3. Not on anticoagulation due to h/o UGIB previously (looks like 2016)  DVT prophylaxis: SCDs - possible surgery today Code Status: Full Family Communication: No family in room Disposition Plan: Home after admit Consults called: Dr. Marcello Moores Admission status: Admit to inpatient   Etta Quill DO [facility] Pager [phone] Only works nights!  If 7AM-7PM, please contact the primary day team physician taking care of patient  [email or web address] Password [email or web address]  05/19/2018,  3:04 AM

## 2018-05-19 NOTE — H&P (View-Only) (Signed)
Surgery:  I have interviewed and examined this patient this morning  84 year old man with history of PAD, CAD, CABG 1 year ago with good result.  Followed by Dr. Meda Coffee from cardiology Had echo and TEE last year.  EF 55 to 60%.  Has no anginal symptoms or exertional dyspnea.  Hypertension.  Atrial fibrillation. Presented with nausea vomiting and abdominal distention. He does not take anticoagulation for his A. fib due to history of GI bleed  Exam reveals mild abdominal distention but the abdomen is soft.  I do not see any scars or hernias.  I do not feel any mass.  Subjectively a little bit uncomfortable to palpate right lower quadrant CT scan shows obstructing mass around hepatic flexure with cecal dilatation.  Assessment/plan: Obstructing mass of hepatic flexure, likely adenocarcinoma There is no value for colonoscopy as he would not prep well CEA requested Recommend proceeding with laparotomy and right colectomy today to avoid perforation  Cardiac wise he seems stable and well worked up and should be acceptable risk for what is an inevitable operation today  I have discussed the indications, details, techniques, and numerous risk of the surgery with him.  He is aware of the risk of bleeding, infection, wound healing problems such as hernia or dehiscence, injury to adjacent organs requiring major reconstructive surgery, anastomotic leak requiring colostomy.  He knows there is a small chance of colostomy today but hopefully will get a primary anastomosis.  Also risk of cardiac, pulmonary, and thromboembolic problems.  He understands all of these issues.  All of his questions were answered.  He agrees with this plan.   Edsel Petrin. Dalbert Batman, M.D., [facility] General and Minimally invasive Surgery Breast and Colorectal Surgery Office:   [phone] Pager:   [phone]

## 2018-05-19 NOTE — Progress Notes (Signed)
PROGRESS NOTE    Vincent Mcclure  OBS:[id number] DOB: 1932/01/17 DOA: 05/18/2018 PCP: Vincent Dance, DO   Brief Narrative:  HPI On 05/19/2018 by Dr. Jennette Kettle Vincent Mcclure is a 84 y.o. male with medical history significant of CAD s/p CABG in 2018, PAF not on anticoagulation due to h/o GIB in past.  Patient presents to ED with onset of Abd pain, distention, N/V onset over past 2 days.  Last BM x3 days ago he says.  Pain is crampy, intermittent.  Vomiting makes it better. Assessment & Plan   Admitted earlier today by Dr. Princella Pellegrini.  Please see H&P for full details.  Abdominal pain secondary to colonic obstruction bleed due to neoplasm -CT abdomen pelvis shows nearly obstructing lesion in the region of the hepatic flexure of the colon which is slightly concerning for primary colonic neoplasm.  Left adrenal lesion is stable compared to prior studies. -General surgery consulted and appreciated, plan for resection today -Continue IV fluids, pain control and antiemetics as needed  Coronary artery disease  -status post CABG in May 2018 -Currently chest pain-free -hold statin due to NPO status -continue scheduled IV metoprolol  Paroxysmal atrial fibrillation -Given n.p.o. status, will place on scheduled IV metoprolol -Patient not on anticoagulation due to history of upper GI bleed  Essential hypertension -Continue IV metoprolol every 6 hours  DVT Prophylaxis  SCDs  Code Status: Full  Family Communication: None at bedside  Disposition Plan: Admitted. Pending surgery today.  Consultants General surgery  Procedures  None  Antibiotics   Anti-infectives (From admission, onward)   Start     Dose/Rate Route Frequency Ordered Stop   05/19/18 0900  cefoTEtan (CEFOTAN) 2 g in sodium chloride 0.9 % 100 mL IVPB     2 g 200 mL/hr over 30 Minutes Intravenous On call to O.R. 05/19/18 4765 05/20/18 0559   05/19/18 0900  cefoTEtan (CEFOTAN) 2 g in sodium chloride 0.9 % 100 mL IVPB   Status:  Discontinued     2 g 200 mL/hr over 30 Minutes Intravenous On call to O.R. 05/19/18 0854 05/19/18 0856      Subjective:   Ward Givens seen and examined today.  Continues to complain of abdominal pain all over, starts on the left side and migration to the right.  Denies current chest pain, shortness of breath, nausea or vomiting, dizziness or headache.  Objective:   Vitals:   05/18/18 2345 05/19/18 0057 05/19/18 0618 05/19/18 0957  BP: (!) 152/101 (!) 166/98 (!) 149/90 (!) 134/98  Pulse: 81 100 77 93  Resp: 18 20 20 20   Temp:  97.8 F (36.6 C) 98.7 F (37.1 C) 97.9 F (36.6 C)  TempSrc:  Oral Oral Oral  SpO2: 94% 98% 96% 95%  Weight:  97.9 kg  97.5 kg  Height:  5\' 11"  (1.803 m)  5\' 11"  (1.803 m)    Intake/Output Summary (Last 24 hours) at 05/19/2018 1046 Last data filed at 05/19/2018 0600 Gross per 24 hour  Intake 1540.34 ml  Output 200 ml  Net 1340.34 ml   Filed Weights   05/18/18 1822 05/19/18 0057 05/19/18 0957  Weight: 98.7 kg 97.9 kg 97.5 kg    Exam  General: Well developed, well nourished, NAD, appears stated age  HEENT: NCAT, mucous membranes moist.   Neck: Suppl  Cardiovascular: S1 S2 auscultated, no rubs, murmurs or gallops. Regular rate and rhythm.  Respiratory: Clear to auscultation bilaterally with equal chest rise  Abdomen: Soft, mildly TTP on  the right, distended, + bowel sounds  Extremities: warm dry without cyanosis clubbing or edema  Neuro: AAOx3, nonfoca  Psych: Normal affect and demeanor with intact judgement and insight   Data Reviewed: I have personally reviewed following labs and imaging studies  CBC: Recent Labs  Lab 05/18/18 1851  WBC 10.1  NEUTROABS 9.1*  HGB 16.2  HCT 47.6  MCV 87.5  PLT 735   Basic Metabolic Panel: Recent Labs  Lab 05/18/18 1851 05/19/18 0429  NA 138 141  K 3.9 3.5  CL 97* 104  CO2 26 27  GLUCOSE 183* 156*  BUN 15 18  CREATININE 1.14 1.00  CALCIUM 9.8 9.1   GFR: Estimated  Creatinine Clearance: 65.5 mL/min (by C-G formula based on SCr of 1 mg/dL). Liver Function Tests: Recent Labs  Lab 05/18/18 1851  AST 28  ALT 11  ALKPHOS 90  BILITOT 0.9  PROT 7.2  ALBUMIN 4.2   Recent Labs  Lab 05/18/18 1851  LIPASE 38   No results for input(s): AMMONIA in the last 168 hours. Coagulation Profile: No results for input(s): INR, PROTIME in the last 168 hours. Cardiac Enzymes: No results for input(s): CKTOTAL, CKMB, CKMBINDEX, TROPONINI in the last 168 hours. BNP (last 3 results) No results for input(s): PROBNP in the last 8760 hours. HbA1C: No results for input(s): HGBA1C in the last 72 hours. CBG: Recent Labs  Lab 05/19/18 0957  GLUCAP 132*   Lipid Profile: No results for input(s): CHOL, HDL, LDLCALC, TRIG, CHOLHDL, LDLDIRECT in the last 72 hours. Thyroid Function Tests: No results for input(s): TSH, T4TOTAL, FREET4, T3FREE, THYROIDAB in the last 72 hours. Anemia Panel: No results for input(s): VITAMINB12, FOLATE, FERRITIN, TIBC, IRON, RETICCTPCT in the last 72 hours. Urine analysis:    Component Value Date/Time   COLORURINE STRAW (A) 02/14/2017 2049   APPEARANCEUR CLEAR 02/14/2017 2049   LABSPEC 1.006 02/14/2017 2049   PHURINE 6.0 02/14/2017 2049   GLUCOSEU NEGATIVE 02/14/2017 2049   GLUCOSEU NEGATIVE 08/11/2016 1521   HGBUR NEGATIVE 02/14/2017 2049   BILIRUBINUR NEGATIVE 02/14/2017 2049   KETONESUR NEGATIVE 02/14/2017 2049   PROTEINUR NEGATIVE 02/14/2017 2049   UROBILINOGEN 1.0 08/11/2016 1521   NITRITE NEGATIVE 02/14/2017 2049   LEUKOCYTESUR NEGATIVE 02/14/2017 2049   Sepsis Labs: @LABRCNTIP (procalcitonin:4,lacticidven:4)  )No results found for this or any previous visit (from the past 240 hour(s)).    Radiology Studies: Ct Abdomen Pelvis W Contrast  Result Date: 05/18/2018 CLINICAL DATA:  84 year old male with history of bloating and constipation for the past 2 days. Emesis today. EXAM: CT ABDOMEN AND PELVIS WITH CONTRAST TECHNIQUE:  Multidetector CT imaging of the abdomen and pelvis was performed using the standard protocol following bolus administration of intravenous contrast. CONTRAST:  124mL ISOVUE-300 IOPAMIDOL (ISOVUE-300) INJECTION 61% COMPARISON:  CT the abdomen and pelvis 05/22/2009. FINDINGS: Lower chest: Aortic atherosclerosis. Atherosclerotic calcifications noted in the left main, left anterior descending, left circumflex and right coronary arteries. Thickening calcification of the aortic valve. Lipomatous hypertrophy of the interatrial septum (normal anatomical variant) incidentally noted. Status post median sternotomy for CABG. Hepatobiliary: No cystic or solid hepatic lesions. No intra or extrahepatic biliary ductal dilatation. Gallbladder is normal in appearance. Pancreas: No pancreatic mass. No pancreatic ductal dilatation. No pancreatic or peripancreatic fluid or inflammatory changes. Spleen: Calcified granulomas throughout the spleen. Adrenals/Urinary Tract: 2.4 cm left adrenal nodule (axial image 35 of series 2), stable compared to remote prior examinations, previously characterized as an adenoma. Right adrenal gland is unremarkable. Multiple subcentimeter low-attenuation lesions  in the left kidney, too small to characterize, but statistically likely to represent tiny cysts. 1.4 cm simple cyst in the interpolar region of the left kidney. Vascular calcifications associated with both kidneys. Right kidney is otherwise unremarkable in appearance. No hydroureteronephrosis. Urinary bladder is normal in appearance. Stomach/Bowel: The appearance of the stomach is unremarkable. In the region of the hepatic flexure there is a mass-like area of mural thickening and luminal narrowing which measures approximately 4.7 x 4.5 x 4.7 cm (axial image 40 of series 2 and sagittal image 110 of series 6), highly concerning for primary colonic neoplasm. Slight haziness in the surrounding soft tissues is noted. Proximal to this there is dilatation  of the cecum and ascending colon with liquid stool. More distal aspect of the colon and rectum are relatively decompressed. Multiple prominent borderline dilated loops of small bowel measuring up to 3.8 cm. Normal appendix. Vascular/Lymphatic: Aortic atherosclerosis, without evidence of aneurysm or dissection in the abdominal or pelvic vasculature. No lymphadenopathy noted in the abdomen or pelvis. Reproductive: Prostate gland and seminal vesicles are unremarkable in appearance. Other: No significant volume of ascites.  No pneumoperitoneum. Musculoskeletal: There are no aggressive appearing lytic or blastic lesions noted in the visualized portions of the skeleton. Median sternotomy wires. IMPRESSION: 1. There appears to be a nearly obstructing lesion in the region of the hepatic flexure of the colon which is highly concerning for primary colonic neoplasm. Slight haziness of the surrounding soft tissues may be indicative of early local invasion. This appears partially obstructive as evidenced by dilatation of more proximal aspects of the small bowel and colon. 2. Left adrenal lesion is stable compared to prior studies, previously characterized as an adenoma. 3. Aortic atherosclerosis, in addition to left main and 3 vessel coronary artery disease. Status post median sternotomy for CABG. 4. Additional incidental findings, as above. Aortic Atherosclerosis (ICD10-I70.0). Electronically Signed   By: Vinnie Langton M.D.   On: 05/18/2018 22:07   Dg Abdomen Acute W/chest  Result Date: 05/18/2018 CLINICAL DATA:  84 year old male with history of constipation, bloating and nausea for the past 4 days. EXAM: DG ABDOMEN ACUTE W/ 1V CHEST COMPARISON:  Chest x-ray 09/14/2017. FINDINGS: Lung volumes are normal. No consolidative airspace disease. No pleural effusions. No pneumothorax. No pulmonary nodule or mass noted. Pulmonary vasculature and the cardiomediastinal silhouette are within normal limits. Low aortic atherosclerosis.  Status post median sternotomy. Multiple dilated loops of gas-filled small bowel are noted measuring up to 5.7 cm in diameter with several air-fluid levels noted on the upright projection. Gas and stool is noted throughout the colon extending to the level of the distal rectum. No pneumoperitoneum. IMPRESSION: 1. Nonspecific bowel gas pattern, as above. Findings could either reflect early or partial small bowel obstruction, or could be indicative of ileus. 2. No pneumoperitoneum. 3. No radiographic evidence of acute cardiopulmonary disease. 4. Aortic atherosclerosis. Electronically Signed   By: Vinnie Langton M.D.   On: 05/18/2018 20:19     Scheduled Meds: . Chlorhexidine Gluconate Cloth  6 each Topical Once   And  . Chlorhexidine Gluconate Cloth  6 each Topical Once  . [MAR Hold] metoprolol tartrate  2.5 mg Intravenous Q6H  . mupirocin ointment       Continuous Infusions: . sodium chloride 100 mL/hr at 05/19/18 0600  . cefoTEtan (CEFOTAN) IV    . lactated ringers       LOS: 1 day   Time Spent in minutes   30 minutes  Jannette Cotham D.O. on  05/19/2018 at 10:46 AM  Between 7am to 7pm - Pager - [phone]  After 7pm go to [email or web address] - password [email or web address]  And look for the night coverage person covering for me after hours  [facility] Office  [phone]

## 2018-05-20 ENCOUNTER — Encounter (HOSPITAL_COMMUNITY): Payer: Self-pay | Admitting: General Surgery

## 2018-05-20 DIAGNOSIS — I10 Essential (primary) hypertension: Secondary | ICD-10-CM

## 2018-05-20 DIAGNOSIS — I480 Paroxysmal atrial fibrillation: Secondary | ICD-10-CM

## 2018-05-20 DIAGNOSIS — K639 Disease of intestine, unspecified: Secondary | ICD-10-CM

## 2018-05-20 LAB — CBC
HCT: 40.1 % (ref 39.0–52.0)
Hemoglobin: 12.6 g/dL — ABNORMAL LOW (ref 13.0–17.0)
MCH: 29.2 pg (ref 26.0–34.0)
MCHC: 31.4 g/dL (ref 30.0–36.0)
MCV: 92.8 fL (ref 78.0–100.0)
PLATELETS: 212 10*3/uL (ref 150–400)
RBC: 4.32 MIL/uL (ref 4.22–5.81)
RDW: 14.7 % (ref 11.5–15.5)
WBC: 6.5 10*3/uL (ref 4.0–10.5)

## 2018-05-20 LAB — BASIC METABOLIC PANEL
ANION GAP: 10 (ref 5–15)
BUN: 26 mg/dL — ABNORMAL HIGH (ref 8–23)
CALCIUM: 8.3 mg/dL — AB (ref 8.9–10.3)
CO2: 26 mmol/L (ref 22–32)
Chloride: 105 mmol/L (ref 98–111)
Creatinine, Ser: 1.52 mg/dL — ABNORMAL HIGH (ref 0.61–1.24)
GFR calc non Af Amer: 40 mL/min — ABNORMAL LOW (ref >60)
GFR, EST AFRICAN AMERICAN: 47 mL/min — AB (ref >60)
Glucose, Bld: 171 mg/dL — ABNORMAL HIGH (ref 70–99)
POTASSIUM: 4.3 mmol/L (ref 3.5–5.1)
SODIUM: 141 mmol/L (ref 135–145)

## 2018-05-20 LAB — CEA: CEA1: 3.1 ng/mL (ref 0.0–4.7)

## 2018-05-20 MED ORDER — HYDRALAZINE HCL 20 MG/ML IJ SOLN
10.0000 mg | Freq: Once | INTRAMUSCULAR | Status: AC
  Administered 2018-05-20: 10 mg via INTRAVENOUS
  Filled 2018-05-20: qty 1

## 2018-05-20 MED ORDER — SODIUM CHLORIDE 0.9 % IV BOLUS
500.0000 mL | Freq: Once | INTRAVENOUS | Status: AC
  Administered 2018-05-20: 500 mL via INTRAVENOUS

## 2018-05-20 NOTE — Progress Notes (Signed)
PROGRESS NOTE    Vincent Mcclure  SHF:[id number] DOB: [birth year] DOA: 05/18/2018 PCP: [name], DO   Brief Narrative:  HPI On 05/19/2018 by Dr. Jennette Kettle Vincent Mcclure is a 84 y.o. male with medical history significant of CAD s/p CABG in 2018, PAF not on anticoagulation due to h/o GIB in past.  Patient presents to ED with onset of Abd pain, distention, N/V onset over past 2 days.  Last BM x3 days ago he says.  Pain is crampy, intermittent.  Vomiting makes it better.  Interim history Admitted for buttock obstruction, general surgery consulted, status post surgery. Assessment & Plan   Abdominal pain secondary to colonic obstruction bleed due to neoplasm -CT abdomen pelvis shows nearly obstructing lesion in the region of the hepatic flexure of the colon which is slightly concerning for primary colonic neoplasm.  Left adrenal lesion is stable compared to prior studies. -General surgery consulted and appreciated, status post exploratory laparotomy right colectomy -Continue IV fluids, pain control and antiemetics as needed -Started on sips of clears today by general surgery  Coronary artery disease  -status post CABG in May 2018 -Currently chest pain-free -hold statin due to NPO status -continue scheduled IV metoprolol  Paroxysmal atrial fibrillation -Given n.p.o. status, will place on scheduled IV metoprolol -Patient not on anticoagulation due to history of upper GI bleed  Essential hypertension -Continue IV metoprolol every 6 hours  Acute kidney injury on chronic kidney disease, stage III -creatinine 1.52 -upon review of patient's chart, GFR in stage III since May 2018, baseline creatinine 1.1-1.3 -Continue IVF and monitor BMP  DVT Prophylaxis  SCDs  Code Status: Full  Family Communication: None at bedside  Disposition Plan: Admitted. Pending surgery today.  Consultants General surgery  Procedures  None  Antibiotics   Anti-infectives (From admission, onward)     Start     Dose/Rate Route Frequency Ordered Stop   05/19/18 2200  cefoTEtan (CEFOTAN) 2 g in sodium chloride 0.9 % 100 mL IVPB     2 g 200 mL/hr over 30 Minutes Intravenous Every 12 hours 05/19/18 1623 05/19/18 2241   05/19/18 0900  cefoTEtan (CEFOTAN) 2 g in sodium chloride 0.9 % 100 mL IVPB     2 g 200 mL/hr over 30 Minutes Intravenous On call to O.R. 05/19/18 0854 05/19/18 1103   05/19/18 0900  cefoTEtan (CEFOTAN) 2 g in sodium chloride 0.9 % 100 mL IVPB  Status:  Discontinued     2 g 200 mL/hr over 30 Minutes Intravenous On call to O.R. 05/19/18 0854 05/19/18 0856      Subjective:   Vincent Mcclure seen and examined today.  Feels abdominal soreness. Denies nausea or vomiting, gas passage. Denies chest pain, shortness of breath, dizziness, headache.   Objective:   Vitals:   05/19/18 1430 05/19/18 1501 05/19/18 2120 05/20/18 0450  BP: 96/70 103/72 140/82 124/75  Pulse: 97 99 (!) 110 88  Resp: 14 14 15 18   Temp: 98.1 F (36.7 C) 98 F (36.7 C) 98.7 F (37.1 C) 98.2 F (36.8 C)  TempSrc:   Oral Oral  SpO2: 97% 96% 97% 95%  Weight:      Height:        Intake/Output Summary (Last 24 hours) at 05/20/2018 1124 Last data filed at 05/20/2018 0454 Gross per 24 hour  Intake 3866.83 ml  Output 500 ml  Net 3366.83 ml   Filed Weights   05/18/18 1822 05/19/18 0057 05/19/18 0957  Weight: 98.7 kg 97.9 kg  97.5 kg   Exam  General: Well developed, well nourished, NAD, appears stated age  66: NCAT, mucous membranes moist.   Neck: Supple  Cardiovascular: S1 S2 auscultated, no rubs, murmurs or gallops. Regular rate and rhythm.  Respiratory: Clear to auscultation bilaterally with equal chest rise  Abdomen: Soft, TTP, nondistended, few BS, honeycomb dressing in place  Extremities: warm dry without cyanosis clubbing or edema  Neuro: AAOx3, nonfocal  Psych: Appropriate mood and affect, pleasant   Data Reviewed: I have personally reviewed following labs and imaging  studies  CBC: Recent Labs  Lab 05/18/18 1851 05/20/18 0514  WBC 10.1 6.5  NEUTROABS 9.1*  --   HGB 16.2 12.6*  HCT 47.6 40.1  MCV 87.5 92.8  PLT 216 852   Basic Metabolic Panel: Recent Labs  Lab 05/18/18 1851 05/19/18 0429 05/20/18 0514  NA 138 141 141  K 3.9 3.5 4.3  CL 97* 104 105  CO2 26 27 26   GLUCOSE 183* 156* 171*  BUN 15 18 26*  CREATININE 1.14 1.00 1.52*  CALCIUM 9.8 9.1 8.3*   GFR: Estimated Creatinine Clearance: 43.1 mL/min (A) (by C-G formula based on SCr of 1.52 mg/dL (H)). Liver Function Tests: Recent Labs  Lab 05/18/18 1851  AST 28  ALT 11  ALKPHOS 90  BILITOT 0.9  PROT 7.2  ALBUMIN 4.2   Recent Labs  Lab 05/18/18 1851  LIPASE 38   No results for input(s): AMMONIA in the last 168 hours. Coagulation Profile: No results for input(s): INR, PROTIME in the last 168 hours. Cardiac Enzymes: No results for input(s): CKTOTAL, CKMB, CKMBINDEX, TROPONINI in the last 168 hours. BNP (last 3 results) No results for input(s): PROBNP in the last 8760 hours. HbA1C: Recent Labs    05/19/18 0856  HGBA1C 5.4   CBG: Recent Labs  Lab 05/19/18 0957  GLUCAP 132*   Lipid Profile: No results for input(s): CHOL, HDL, LDLCALC, TRIG, CHOLHDL, LDLDIRECT in the last 72 hours. Thyroid Function Tests: No results for input(s): TSH, T4TOTAL, FREET4, T3FREE, THYROIDAB in the last 72 hours. Anemia Panel: No results for input(s): VITAMINB12, FOLATE, FERRITIN, TIBC, IRON, RETICCTPCT in the last 72 hours. Urine analysis:    Component Value Date/Time   COLORURINE STRAW (A) 02/14/2017 2049   APPEARANCEUR CLEAR 02/14/2017 2049   LABSPEC 1.006 02/14/2017 2049   PHURINE 6.0 02/14/2017 2049   GLUCOSEU NEGATIVE 02/14/2017 2049   GLUCOSEU NEGATIVE 08/11/2016 1521   HGBUR NEGATIVE 02/14/2017 2049   BILIRUBINUR NEGATIVE 02/14/2017 2049   KETONESUR NEGATIVE 02/14/2017 2049   PROTEINUR NEGATIVE 02/14/2017 2049   UROBILINOGEN 1.0 08/11/2016 1521   NITRITE NEGATIVE  02/14/2017 2049   LEUKOCYTESUR NEGATIVE 02/14/2017 2049   Sepsis Labs: @LABRCNTIP (procalcitonin:4,lacticidven:4)  )No results found for this or any previous visit (from the past 240 hour(s)).    Radiology Studies: Ct Abdomen Pelvis W Contrast  Result Date: 05/18/2018 CLINICAL DATA:  84 year old male with history of bloating and constipation for the past 2 days. Emesis today. EXAM: CT ABDOMEN AND PELVIS WITH CONTRAST TECHNIQUE: Multidetector CT imaging of the abdomen and pelvis was performed using the standard protocol following bolus administration of intravenous contrast. CONTRAST:  158mL ISOVUE-300 IOPAMIDOL (ISOVUE-300) INJECTION 61% COMPARISON:  CT the abdomen and pelvis 05/22/2009. FINDINGS: Lower chest: Aortic atherosclerosis. Atherosclerotic calcifications noted in the left main, left anterior descending, left circumflex and right coronary arteries. Thickening calcification of the aortic valve. Lipomatous hypertrophy of the interatrial septum (normal anatomical variant) incidentally noted. Status post median sternotomy for CABG.  Hepatobiliary: No cystic or solid hepatic lesions. No intra or extrahepatic biliary ductal dilatation. Gallbladder is normal in appearance. Pancreas: No pancreatic mass. No pancreatic ductal dilatation. No pancreatic or peripancreatic fluid or inflammatory changes. Spleen: Calcified granulomas throughout the spleen. Adrenals/Urinary Tract: 2.4 cm left adrenal nodule (axial image 35 of series 2), stable compared to remote prior examinations, previously characterized as an adenoma. Right adrenal gland is unremarkable. Multiple subcentimeter low-attenuation lesions in the left kidney, too small to characterize, but statistically likely to represent tiny cysts. 1.4 cm simple cyst in the interpolar region of the left kidney. Vascular calcifications associated with both kidneys. Right kidney is otherwise unremarkable in appearance. No hydroureteronephrosis. Urinary bladder is  normal in appearance. Stomach/Bowel: The appearance of the stomach is unremarkable. In the region of the hepatic flexure there is a mass-like area of mural thickening and luminal narrowing which measures approximately 4.7 x 4.5 x 4.7 cm (axial image 40 of series 2 and sagittal image 110 of series 6), highly concerning for primary colonic neoplasm. Slight haziness in the surrounding soft tissues is noted. Proximal to this there is dilatation of the cecum and ascending colon with liquid stool. More distal aspect of the colon and rectum are relatively decompressed. Multiple prominent borderline dilated loops of small bowel measuring up to 3.8 cm. Normal appendix. Vascular/Lymphatic: Aortic atherosclerosis, without evidence of aneurysm or dissection in the abdominal or pelvic vasculature. No lymphadenopathy noted in the abdomen or pelvis. Reproductive: Prostate gland and seminal vesicles are unremarkable in appearance. Other: No significant volume of ascites.  No pneumoperitoneum. Musculoskeletal: There are no aggressive appearing lytic or blastic lesions noted in the visualized portions of the skeleton. Median sternotomy wires. IMPRESSION: 1. There appears to be a nearly obstructing lesion in the region of the hepatic flexure of the colon which is highly concerning for primary colonic neoplasm. Slight haziness of the surrounding soft tissues may be indicative of early local invasion. This appears partially obstructive as evidenced by dilatation of more proximal aspects of the small bowel and colon. 2. Left adrenal lesion is stable compared to prior studies, previously characterized as an adenoma. 3. Aortic atherosclerosis, in addition to left main and 3 vessel coronary artery disease. Status post median sternotomy for CABG. 4. Additional incidental findings, as above. Aortic Atherosclerosis (ICD10-I70.0). Electronically Signed   By: Vinnie Langton M.D.   On: 05/18/2018 22:07   Dg Abdomen Acute W/chest  Result  Date: 05/18/2018 CLINICAL DATA:  84 year old male with history of constipation, bloating and nausea for the past 4 days. EXAM: DG ABDOMEN ACUTE W/ 1V CHEST COMPARISON:  Chest x-ray 09/14/2017. FINDINGS: Lung volumes are normal. No consolidative airspace disease. No pleural effusions. No pneumothorax. No pulmonary nodule or mass noted. Pulmonary vasculature and the cardiomediastinal silhouette are within normal limits. Low aortic atherosclerosis. Status post median sternotomy. Multiple dilated loops of gas-filled small bowel are noted measuring up to 5.7 cm in diameter with several air-fluid levels noted on the upright projection. Gas and stool is noted throughout the colon extending to the level of the distal rectum. No pneumoperitoneum. IMPRESSION: 1. Nonspecific bowel gas pattern, as above. Findings could either reflect early or partial small bowel obstruction, or could be indicative of ileus. 2. No pneumoperitoneum. 3. No radiographic evidence of acute cardiopulmonary disease. 4. Aortic atherosclerosis. Electronically Signed   By: Vinnie Langton M.D.   On: 05/18/2018 20:19   Dg Abd Portable 1v  Result Date: 05/19/2018 CLINICAL DATA:  84 year old male with a history surgery. Operative  plain film for incomplete surgical instrument count EXAM: PORTABLE ABDOMEN - 1 VIEW COMPARISON:  05/18/2018 FINDINGS: Gas within small bowel, colon.  Borderline dilated small bowel. Surgical changes along the midline abdomen of laparotomy. Surgical changes of the right upper quadrant. Surgical changes of median sternotomy and CABG. No unexpected radiopaque foreign body. Calcifications of the vasculature. IMPRESSION: Plain film is negative for radiopaque foreign body. Surgical changes of the abdomen, as above. These results were called by telephone at the time of interpretation on 05/19/2018 at 12:48 pm to circulating team in OR 2, who verbally acknowledged these results. Electronically Signed   By: Corrie Mckusick D.O.   On:  05/19/2018 12:48     Scheduled Meds: . acetaminophen  1,000 mg Oral Q6H  . enoxaparin (LOVENOX) injection  40 mg Subcutaneous Q24H  . gabapentin  100 mg Oral BID  . metoprolol tartrate  2.5 mg Intravenous Q6H  . pantoprazole  20 mg Oral Daily   Continuous Infusions: . sodium chloride 100 mL/hr at 05/19/18 1527  . lactated ringers Stopped (05/19/18 2211)     LOS: 2 days   Time Spent in minutes   30 minutes  Leodan Bolyard D.O. on 05/20/2018 at 11:24 AM  Between 7am to 7pm - Pager - [phone]  After 7pm go to [email or web address] - password [email or web address]  And look for the night coverage person covering for me after hours  [facility] Office  [phone]

## 2018-05-20 NOTE — Progress Notes (Signed)
1 Day Post-Op   Subjective/Chief Complaint: Feels well, sore but pain better than preop. No flatus yet. Low UOP overnight and increased creatinine   Objective: Vital signs in last 24 hours: Temp:  [97.9 F (36.6 C)-98.7 F (37.1 C)] 98.2 F (36.8 C) (08/10 0450) Pulse Rate:  [88-110] 88 (08/10 0450) Resp:  [11-20] 18 (08/10 0450) BP: (91-150)/(66-103) 124/75 (08/10 0450) SpO2:  [94 %-100 %] 95 % (08/10 0450) Weight:  [97.5 kg] 97.5 kg (08/09 0957) Last BM Date: 05/16/18  Intake/Output from previous day: 08/09 0701 - 08/10 0700 In: 4066.8 [I.V.:4066.8] Out: 950 [Urine:550; Blood:400] Intake/Output this shift: No intake/output data recorded.  General appearance: alert and cooperative Resp: unlabored GI: soft, distended, appropriately tender Extremities: no edema Incision/Wound: c/d/i under honeycomb  Lab Results:  Recent Labs    05/18/18 1851 05/20/18 0514  WBC 10.1 6.5  HGB 16.2 12.6*  HCT 47.6 40.1  PLT 216 212   BMET Recent Labs    05/19/18 0429 05/20/18 0514  NA 141 141  K 3.5 4.3  CL 104 105  CO2 27 26  GLUCOSE 156* 171*  BUN 18 26*  CREATININE 1.00 1.52*  CALCIUM 9.1 8.3*   PT/INR No results for input(s): LABPROT, INR in the last 72 hours. ABG No results for input(s): PHART, HCO3 in the last 72 hours.  Invalid input(s): PCO2, PO2  Studies/Results: Ct Abdomen Pelvis W Contrast  Result Date: 05/18/2018 CLINICAL DATA:  84 year old male with history of bloating and constipation for the past 2 days. Emesis today. EXAM: CT ABDOMEN AND PELVIS WITH CONTRAST TECHNIQUE: Multidetector CT imaging of the abdomen and pelvis was performed using the standard protocol following bolus administration of intravenous contrast. CONTRAST:  140mL ISOVUE-300 IOPAMIDOL (ISOVUE-300) INJECTION 61% COMPARISON:  CT the abdomen and pelvis 05/22/2009. FINDINGS: Lower chest: Aortic atherosclerosis. Atherosclerotic calcifications noted in the left main, left anterior descending,  left circumflex and right coronary arteries. Thickening calcification of the aortic valve. Lipomatous hypertrophy of the interatrial septum (normal anatomical variant) incidentally noted. Status post median sternotomy for CABG. Hepatobiliary: No cystic or solid hepatic lesions. No intra or extrahepatic biliary ductal dilatation. Gallbladder is normal in appearance. Pancreas: No pancreatic mass. No pancreatic ductal dilatation. No pancreatic or peripancreatic fluid or inflammatory changes. Spleen: Calcified granulomas throughout the spleen. Adrenals/Urinary Tract: 2.4 cm left adrenal nodule (axial image 35 of series 2), stable compared to remote prior examinations, previously characterized as an adenoma. Right adrenal gland is unremarkable. Multiple subcentimeter low-attenuation lesions in the left kidney, too small to characterize, but statistically likely to represent tiny cysts. 1.4 cm simple cyst in the interpolar region of the left kidney. Vascular calcifications associated with both kidneys. Right kidney is otherwise unremarkable in appearance. No hydroureteronephrosis. Urinary bladder is normal in appearance. Stomach/Bowel: The appearance of the stomach is unremarkable. In the region of the hepatic flexure there is a mass-like area of mural thickening and luminal narrowing which measures approximately 4.7 x 4.5 x 4.7 cm (axial image 40 of series 2 and sagittal image 110 of series 6), highly concerning for primary colonic neoplasm. Slight haziness in the surrounding soft tissues is noted. Proximal to this there is dilatation of the cecum and ascending colon with liquid stool. More distal aspect of the colon and rectum are relatively decompressed. Multiple prominent borderline dilated loops of small bowel measuring up to 3.8 cm. Normal appendix. Vascular/Lymphatic: Aortic atherosclerosis, without evidence of aneurysm or dissection in the abdominal or pelvic vasculature. No lymphadenopathy noted in the abdomen  or  pelvis. Reproductive: Prostate gland and seminal vesicles are unremarkable in appearance. Other: No significant volume of ascites.  No pneumoperitoneum. Musculoskeletal: There are no aggressive appearing lytic or blastic lesions noted in the visualized portions of the skeleton. Median sternotomy wires. IMPRESSION: 1. There appears to be a nearly obstructing lesion in the region of the hepatic flexure of the colon which is highly concerning for primary colonic neoplasm. Slight haziness of the surrounding soft tissues may be indicative of early local invasion. This appears partially obstructive as evidenced by dilatation of more proximal aspects of the small bowel and colon. 2. Left adrenal lesion is stable compared to prior studies, previously characterized as an adenoma. 3. Aortic atherosclerosis, in addition to left main and 3 vessel coronary artery disease. Status post median sternotomy for CABG. 4. Additional incidental findings, as above. Aortic Atherosclerosis (ICD10-I70.0). Electronically Signed   By: Vinnie Langton M.D.   On: 05/18/2018 22:07   Dg Abdomen Acute W/chest  Result Date: 05/18/2018 CLINICAL DATA:  84 year old male with history of constipation, bloating and nausea for the past 4 days. EXAM: DG ABDOMEN ACUTE W/ 1V CHEST COMPARISON:  Chest x-ray 09/14/2017. FINDINGS: Lung volumes are normal. No consolidative airspace disease. No pleural effusions. No pneumothorax. No pulmonary nodule or mass noted. Pulmonary vasculature and the cardiomediastinal silhouette are within normal limits. Low aortic atherosclerosis. Status post median sternotomy. Multiple dilated loops of gas-filled small bowel are noted measuring up to 5.7 cm in diameter with several air-fluid levels noted on the upright projection. Gas and stool is noted throughout the colon extending to the level of the distal rectum. No pneumoperitoneum. IMPRESSION: 1. Nonspecific bowel gas pattern, as above. Findings could either reflect early or  partial small bowel obstruction, or could be indicative of ileus. 2. No pneumoperitoneum. 3. No radiographic evidence of acute cardiopulmonary disease. 4. Aortic atherosclerosis. Electronically Signed   By: Vinnie Langton M.D.   On: 05/18/2018 20:19   Dg Abd Portable 1v  Result Date: 05/19/2018 CLINICAL DATA:  84 year old male with a history surgery. Operative plain film for incomplete surgical instrument count EXAM: PORTABLE ABDOMEN - 1 VIEW COMPARISON:  05/18/2018 FINDINGS: Gas within small bowel, colon.  Borderline dilated small bowel. Surgical changes along the midline abdomen of laparotomy. Surgical changes of the right upper quadrant. Surgical changes of median sternotomy and CABG. No unexpected radiopaque foreign body. Calcifications of the vasculature. IMPRESSION: Plain film is negative for radiopaque foreign body. Surgical changes of the abdomen, as above. These results were called by telephone at the time of interpretation on 05/19/2018 at 12:48 pm to circulating team in OR 2, who verbally acknowledged these results. Electronically Signed   By: Corrie Mckusick D.O.   On: 05/19/2018 12:48    Anti-infectives: Anti-infectives (From admission, onward)   Start     Dose/Rate Route Frequency Ordered Stop   05/19/18 2200  cefoTEtan (CEFOTAN) 2 g in sodium chloride 0.9 % 100 mL IVPB     2 g 200 mL/hr over 30 Minutes Intravenous Every 12 hours 05/19/18 1623 05/19/18 2241   05/19/18 0900  cefoTEtan (CEFOTAN) 2 g in sodium chloride 0.9 % 100 mL IVPB     2 g 200 mL/hr over 30 Minutes Intravenous On call to O.R. 05/19/18 0854 05/19/18 1103   05/19/18 0900  cefoTEtan (CEFOTAN) 2 g in sodium chloride 0.9 % 100 mL IVPB  Status:  Discontinued     2 g 200 mL/hr over 30 Minutes Intravenous On call to O.R. 05/19/18 4098  05/19/18 0856      Assessment/Plan: s/p Procedure(s): EXPLORATORY LAPAROTOMY RIGHT COLECTOMY (N/A) OK for sips of clears Ambulate/ PT- must get out of bed today Acute kidney injury/  oliguria: fluid bolus, stop toradol, recheck tomorrow  LOS: 2 days    Vincent Mcclure 05/20/2018

## 2018-05-20 NOTE — Evaluation (Signed)
Physical Therapy Evaluation Patient Details Name: Vincent Mcclure MRN: [id number] DOB: May 09, 1932 Today's Date: 05/20/2018   History of Present Illness  Vincent Mcclure is a 84 y.o. male with medical history significant of CAD s/p CABG in 2018, PAF, GIB;  Patient presents to ED with onset of Abd pain, distention, N/V; s/p exp lap with colectomy  on 05/19/18   Clinical Impression  Pt very pleasant and agreeable to PT; amb 63' with RW and min/guard, dyspneic after amb--SpO2=96% on RA; supplemental O2 left off, NT made aware; PT will continue to follow in acute setting, do not feel pt will need any follow up, he has DME    Follow Up Recommendations No PT follow up    Equipment Recommendations  None recommended by PT    Recommendations for Other Services       Precautions / Restrictions Precautions Precautions: Fall Restrictions Weight Bearing Restrictions: No      Mobility  Bed Mobility Overal bed mobility: Needs Assistance Bed Mobility: Sit to Supine       Sit to supine: Min assist   General bed mobility comments: assist to bring LEs onto bed  Transfers Overall transfer level: Needs assistance Equipment used: Rolling walker (2 wheeled) Transfers: Sit to/from Stand Sit to Stand: Min guard         General transfer comment: for safety  Ambulation/Gait Ambulation/Gait assistance: Min guard Gait Distance (Feet): 80 Feet Assistive device: Rolling walker (2 wheeled) Gait Pattern/deviations: Step-through pattern;Decreased stride length     General Gait Details: cues for RW safety; mild dizziness on standing, resolved--likely d/t pain meds  Stairs            Wheelchair Mobility    Modified Rankin (Stroke Patients Only)       Balance Overall balance assessment: Needs assistance Sitting-balance support: No upper extremity supported;Feet supported Sitting balance-Leahy Scale: Good       Standing balance-Leahy Scale: Fair Standing balance comment: reliant  on UEs for dynamic activities                             Pertinent Vitals/Pain Pain Assessment: No/denies pain    Home Living Family/patient expects to be discharged to:: Private residence Living Arrangements: Spouse/significant other Available Help at Discharge: Available 24 hours/day Type of Home: House       Home Layout: One level Home Equipment: Walker - 2 wheels;Cane - single point Additional Comments: has ramp to the "man cave" also    Prior Function Level of Independence: Independent with assistive device(s)         Comments: uses cane at times;      Hand Dominance        Extremity/Trunk Assessment   Upper Extremity Assessment Upper Extremity Assessment: Overall WFL for tasks assessed    Lower Extremity Assessment Lower Extremity Assessment: Overall WFL for tasks assessed       Communication   Communication: HOH  Cognition Arousal/Alertness: Awake/alert Behavior During Therapy: WFL for tasks assessed/performed Overall Cognitive Status: No family/caregiver present to determine baseline cognitive functioning Area of Impairment: Following commands;Problem solving;Memory                     Memory: Decreased short-term memory Following Commands: Follows one step commands consistently;Follows multi-step commands with increased time     Problem Solving: Difficulty sequencing;Requires verbal cues General Comments: deficts above possibly d/t morphine given prior to PT session; pt repeating self,  getting confused when discussing memebers of his family; conversant throughout PT session      General Comments      Exercises     Assessment/Plan    PT Assessment Patient needs continued PT services  PT Problem List Decreased activity tolerance;Decreased balance;Decreased mobility;Decreased knowledge of use of DME       PT Treatment Interventions DME instruction;Therapeutic activities;Gait training;Therapeutic exercise;Patient/family  education;Functional mobility training    PT Goals (Current goals can be found in the Care Plan section)  Acute Rehab PT Goals Patient Stated Goal: get better PT Goal Formulation: With patient Time For Goal Achievement: 06/03/18 Potential to Achieve Goals: Good    Frequency Min 3X/week   Barriers to discharge        Co-evaluation               AM-PAC PT "6 Clicks" Daily Activity  Outcome Measure Difficulty turning over in bed (including adjusting bedclothes, sheets and blankets)?: A Lot Difficulty moving from lying on back to sitting on the side of the bed? : Unable Difficulty sitting down on and standing up from a chair with arms (e.g., wheelchair, bedside commode, etc,.)?: Unable Help needed moving to and from a bed to chair (including a wheelchair)?: A Little Help needed walking in hospital room?: A Little Help needed climbing 3-5 steps with a railing? : A Little 6 Click Score: 13    End of Session Equipment Utilized During Treatment: Gait belt Activity Tolerance: Patient tolerated treatment well Patient left: in bed;with call bell/phone within reach;with bed alarm set   PT Visit Diagnosis: Unsteadiness on feet (R26.81)    Time: 5672-0919 PT Time Calculation (min) (ACUTE ONLY): 18 min   Charges:   PT Evaluation $PT Eval Low Complexity: 1 Low          Kenyon Ana, PT Pager: [phone] 05/20/2018   [id number] [facility] 05/20/2018, 10:43 AM

## 2018-05-21 LAB — CBC
HCT: 39.9 % (ref 39.0–52.0)
HEMOGLOBIN: 12.9 g/dL — AB (ref 13.0–17.0)
MCH: 29.7 pg (ref 26.0–34.0)
MCHC: 32.3 g/dL (ref 30.0–36.0)
MCV: 91.7 fL (ref 78.0–100.0)
Platelets: 203 10*3/uL (ref 150–400)
RBC: 4.35 MIL/uL (ref 4.22–5.81)
RDW: 14.5 % (ref 11.5–15.5)
WBC: 6.9 10*3/uL (ref 4.0–10.5)

## 2018-05-21 LAB — BASIC METABOLIC PANEL
Anion gap: 7 (ref 5–15)
BUN: 25 mg/dL — ABNORMAL HIGH (ref 8–23)
CALCIUM: 8.3 mg/dL — AB (ref 8.9–10.3)
CO2: 26 mmol/L (ref 22–32)
Chloride: 108 mmol/L (ref 98–111)
Creatinine, Ser: 1.13 mg/dL (ref 0.61–1.24)
GFR calc non Af Amer: 58 mL/min — ABNORMAL LOW (ref >60)
Glucose, Bld: 112 mg/dL — ABNORMAL HIGH (ref 70–99)
Potassium: 4 mmol/L (ref 3.5–5.1)
SODIUM: 141 mmol/L (ref 135–145)

## 2018-05-21 LAB — MAGNESIUM: MAGNESIUM: 2 mg/dL (ref 1.7–2.4)

## 2018-05-21 NOTE — Progress Notes (Signed)
2 Days Post-Op   Subjective/Chief Complaint: Had some pain this am, nausea yesterday. Reports flatus but no BM. Using IS, pulls up to 1300. Walked with PT yesterday and did well with that.   Objective: Vital signs in last 24 hours: Temp:  [97.6 F (36.4 C)-98.1 F (36.7 C)] 97.6 F (36.4 C) (08/11 0418) Pulse Rate:  [91-123] 113 (08/11 0418) Resp:  [17-18] 18 (08/11 0418) BP: (131-177)/(73-101) 138/90 (08/11 0418) SpO2:  [94 %-96 %] 96 % (08/11 0418) Last BM Date: 05/16/18  Intake/Output from previous day: 08/10 0701 - 08/11 0700 In: 1443.9 [I.V.:1443.9] Out: 650 [Urine:650] Intake/Output this shift: No intake/output data recorded.  General appearance: alert and cooperative Resp: unlabored GI: soft, distended, appropriately tender Extremities: no edema Incision/Wound: c/d/i under honeycomb  Lab Results:  Recent Labs    05/20/18 0514 05/21/18 0441  WBC 6.5 6.9  HGB 12.6* 12.9*  HCT 40.1 39.9  PLT 212 203   BMET Recent Labs    05/20/18 0514 05/21/18 0441  NA 141 141  K 4.3 4.0  CL 105 108  CO2 26 26  GLUCOSE 171* 112*  BUN 26* 25*  CREATININE 1.52* 1.13  CALCIUM 8.3* 8.3*   PT/INR No results for input(s): LABPROT, INR in the last 72 hours. ABG No results for input(s): PHART, HCO3 in the last 72 hours.  Invalid input(s): PCO2, PO2  Studies/Results: Dg Abd Portable 1v  Result Date: 05/19/2018 CLINICAL DATA:  84 year old male with a history surgery. Operative plain film for incomplete surgical instrument count EXAM: PORTABLE ABDOMEN - 1 VIEW COMPARISON:  05/18/2018 FINDINGS: Gas within small bowel, colon.  Borderline dilated small bowel. Surgical changes along the midline abdomen of laparotomy. Surgical changes of the right upper quadrant. Surgical changes of median sternotomy and CABG. No unexpected radiopaque foreign body. Calcifications of the vasculature. IMPRESSION: Plain film is negative for radiopaque foreign body. Surgical changes of the abdomen, as  above. These results were called by telephone at the time of interpretation on 05/19/2018 at 12:48 pm to circulating team in OR 2, who verbally acknowledged these results. Electronically Signed   By: Corrie Mckusick D.O.   On: 05/19/2018 12:48    Anti-infectives: Anti-infectives (From admission, onward)   Start     Dose/Rate Route Frequency Ordered Stop   05/19/18 2200  cefoTEtan (CEFOTAN) 2 g in sodium chloride 0.9 % 100 mL IVPB     2 g 200 mL/hr over 30 Minutes Intravenous Every 12 hours 05/19/18 1623 05/19/18 2241   05/19/18 0900  cefoTEtan (CEFOTAN) 2 g in sodium chloride 0.9 % 100 mL IVPB     2 g 200 mL/hr over 30 Minutes Intravenous On call to O.R. 05/19/18 0854 05/19/18 1103   05/19/18 0900  cefoTEtan (CEFOTAN) 2 g in sodium chloride 0.9 % 100 mL IVPB  Status:  Discontinued     2 g 200 mL/hr over 30 Minutes Intravenous On call to O.R. 05/19/18 0017 05/19/18 0856      Assessment/Plan: s/p Procedure(s): EXPLORATORY LAPAROTOMY RIGHT COLECTOMY (N/A) Probable ileus- keep on sips of clears for now, await bowel function Ambulate/ PT-  Acute kidney injury/ oliguria: creatinine improved today  LOS: 3 days    Clovis Riley 05/21/2018

## 2018-05-21 NOTE — Progress Notes (Signed)
PROGRESS NOTE    Vincent Mcclure  XLK:[id number] DOB: 06-11-1932 DOA: 05/18/2018 PCP: [name], DO   Brief Narrative:  HPI On 05/19/2018 by Dr. Jennette Kettle Vincent Mcclure is a 84 y.o. male with medical history significant of CAD s/p CABG in 2018, PAF not on anticoagulation due to h/o GIB in past.  Patient presents to ED with onset of Abd pain, distention, N/V onset over past 2 days.  Last BM x3 days ago he says.  Pain is crampy, intermittent.  Vomiting makes it better.  Interim history Admitted for buttock obstruction, general surgery consulted, status post surgery. Assessment & Plan   Abdominal pain secondary to colonic obstruction bleed due to neoplasm -CT abdomen pelvis shows nearly obstructing lesion in the region of the hepatic flexure of the colon which is slightly concerning for primary colonic neoplasm.  Left adrenal lesion is stable compared to prior studies. -General surgery consulted and appreciated, status post exploratory laparotomy right colectomy -Continue IV fluids, pain control and antiemetics as needed -Per surgery, continue sips of clears today by general surgery  Nausea -possibly secondary to the above vs ileus -continue antiemetic  Coronary artery disease  -status post CABG in May 2018 -Currently chest pain-free -hold statin due to NPO status -continue scheduled IV metoprolol  Paroxysmal atrial fibrillation -Given n.p.o. status, will place on scheduled IV metoprolol -Patient not on anticoagulation due to history of upper GI bleed  Essential hypertension -Continue IV metoprolol every 6 hours  Acute kidney injury on chronic kidney disease, stage III -creatinine peaked at 1.52 -upon review of patient's chart, GFR in stage III since May 2018, baseline creatinine 1.1-1.3 -Currently creatinine 1.13 -Continue IVF and monitor BMP  DVT Prophylaxis  SCDs  Code Status: Full  Family Communication: None at bedside  Disposition Plan: Admitted. Pending  improvement.  Consultants General surgery  Procedures  exploratory laparotomy right colectomy  Antibiotics   Anti-infectives (From admission, onward)   Start     Dose/Rate Route Frequency Ordered Stop   05/19/18 2200  cefoTEtan (CEFOTAN) 2 g in sodium chloride 0.9 % 100 mL IVPB     2 g 200 mL/hr over 30 Minutes Intravenous Every 12 hours 05/19/18 1623 05/19/18 2241   05/19/18 0900  cefoTEtan (CEFOTAN) 2 g in sodium chloride 0.9 % 100 mL IVPB     2 g 200 mL/hr over 30 Minutes Intravenous On call to O.R. 05/19/18 0854 05/19/18 1103   05/19/18 0900  cefoTEtan (CEFOTAN) 2 g in sodium chloride 0.9 % 100 mL IVPB  Status:  Discontinued     2 g 200 mL/hr over 30 Minutes Intravenous On call to O.R. 05/19/18 0854 05/19/18 0856      Subjective:   Vincent Mcclure seen and examined today. Continues to have some abdominal soreness. Complains of nausea this morning. Has had gas passage. Denies current vomiting, chest pain, shortness of breath, dizziness, headache.   Objective:   Vitals:   05/20/18 1233 05/20/18 2030 05/20/18 2128 05/21/18 0418  BP: 131/84 (!) 177/101 (!) 148/73 138/90  Pulse: 91 (!) 113 (!) 123 (!) 113  Resp: 18 17  18   Temp:  98.1 F (36.7 C)  97.6 F (36.4 C)  TempSrc:  Oral  Oral  SpO2: 94% 94% 95% 96%  Weight:      Height:        Intake/Output Summary (Last 24 hours) at 05/21/2018 1050 Last data filed at 05/21/2018 0600 Gross per 24 hour  Intake 1443.94 ml  Output 650  ml  Net 793.94 ml   Filed Weights   05/18/18 1822 05/19/18 0057 05/19/18 0957  Weight: 98.7 kg 97.9 kg 97.5 kg   Exam  General: Well developed, well nourished, NAD, appears stated age  72: NCAT, mucous membranes moist.   Neck: Supple  Cardiovascular: S1 S2 auscultated, RRR, no murmur  Respiratory: Clear to auscultation bilaterally with equal chest rise  Abdomen: Soft, TTP, distended, + few bowel sounds  Extremities: warm dry without cyanosis clubbing or edema  Neuro: AAOx3,  nonfocal  Psych: Normal affect and demeanor, pleasant  Data Reviewed: I have personally reviewed following labs and imaging studies  CBC: Recent Labs  Lab 05/18/18 1851 05/20/18 0514 05/21/18 0441  WBC 10.1 6.5 6.9  NEUTROABS 9.1*  --   --   HGB 16.2 12.6* 12.9*  HCT 47.6 40.1 39.9  MCV 87.5 92.8 91.7  PLT 216 212 160   Basic Metabolic Panel: Recent Labs  Lab 05/18/18 1851 05/19/18 0429 05/20/18 0514 05/21/18 0441  NA 138 141 141 141  K 3.9 3.5 4.3 4.0  CL 97* 104 105 108  CO2 26 27 26 26   GLUCOSE 183* 156* 171* 112*  BUN 15 18 26* 25*  CREATININE 1.14 1.00 1.52* 1.13  CALCIUM 9.8 9.1 8.3* 8.3*  MG  --   --   --  2.0   GFR: Estimated Creatinine Clearance: 58 mL/min (by C-G formula based on SCr of 1.13 mg/dL). Liver Function Tests: Recent Labs  Lab 05/18/18 1851  AST 28  ALT 11  ALKPHOS 90  BILITOT 0.9  PROT 7.2  ALBUMIN 4.2   Recent Labs  Lab 05/18/18 1851  LIPASE 38   No results for input(s): AMMONIA in the last 168 hours. Coagulation Profile: No results for input(s): INR, PROTIME in the last 168 hours. Cardiac Enzymes: No results for input(s): CKTOTAL, CKMB, CKMBINDEX, TROPONINI in the last 168 hours. BNP (last 3 results) No results for input(s): PROBNP in the last 8760 hours. HbA1C: Recent Labs    05/19/18 0856  HGBA1C 5.4   CBG: Recent Labs  Lab 05/19/18 0957  GLUCAP 132*   Lipid Profile: No results for input(s): CHOL, HDL, LDLCALC, TRIG, CHOLHDL, LDLDIRECT in the last 72 hours. Thyroid Function Tests: No results for input(s): TSH, T4TOTAL, FREET4, T3FREE, THYROIDAB in the last 72 hours. Anemia Panel: No results for input(s): VITAMINB12, FOLATE, FERRITIN, TIBC, IRON, RETICCTPCT in the last 72 hours. Urine analysis:    Component Value Date/Time   COLORURINE STRAW (A) 02/14/2017 2049   APPEARANCEUR CLEAR 02/14/2017 2049   LABSPEC 1.006 02/14/2017 2049   PHURINE 6.0 02/14/2017 2049   GLUCOSEU NEGATIVE 02/14/2017 2049   GLUCOSEU  NEGATIVE 08/11/2016 1521   HGBUR NEGATIVE 02/14/2017 2049   BILIRUBINUR NEGATIVE 02/14/2017 2049   KETONESUR NEGATIVE 02/14/2017 2049   PROTEINUR NEGATIVE 02/14/2017 2049   UROBILINOGEN 1.0 08/11/2016 1521   NITRITE NEGATIVE 02/14/2017 2049   LEUKOCYTESUR NEGATIVE 02/14/2017 2049   Sepsis Labs: @LABRCNTIP (procalcitonin:4,lacticidven:4)  )No results found for this or any previous visit (from the past 240 hour(s)).    Radiology Studies: Dg Abd Portable 1v  Result Date: 05/19/2018 CLINICAL DATA:  84 year old male with a history surgery. Operative plain film for incomplete surgical instrument count EXAM: PORTABLE ABDOMEN - 1 VIEW COMPARISON:  05/18/2018 FINDINGS: Gas within small bowel, colon.  Borderline dilated small bowel. Surgical changes along the midline abdomen of laparotomy. Surgical changes of the right upper quadrant. Surgical changes of median sternotomy and CABG. No unexpected radiopaque foreign  body. Calcifications of the vasculature. IMPRESSION: Plain film is negative for radiopaque foreign body. Surgical changes of the abdomen, as above. These results were called by telephone at the time of interpretation on 05/19/2018 at 12:48 pm to circulating team in OR 2, who verbally acknowledged these results. Electronically Signed   By: Corrie Mckusick D.O.   On: 05/19/2018 12:48     Scheduled Meds: . enoxaparin (LOVENOX) injection  40 mg Subcutaneous Q24H  . gabapentin  100 mg Oral BID  . metoprolol tartrate  2.5 mg Intravenous Q6H  . pantoprazole  20 mg Oral Daily   Continuous Infusions: . sodium chloride 100 mL/hr at 05/21/18 0600  . lactated ringers Stopped (05/19/18 2211)     LOS: 3 days   Time Spent in minutes   30 minutes  Shwanda Soltis D.O. on 05/21/2018 at 10:50 AM  Between 7am to 7pm - Pager - [phone]  After 7pm go to [email or web address] - password [email or web address]  And look for the night coverage person covering for me after hours  [facility] Office   [phone]

## 2018-05-22 ENCOUNTER — Inpatient Hospital Stay (HOSPITAL_COMMUNITY): Payer: Medicare HMO

## 2018-05-22 LAB — CBC
HEMATOCRIT: 37.3 % — AB (ref 39.0–52.0)
Hemoglobin: 11.9 g/dL — ABNORMAL LOW (ref 13.0–17.0)
MCH: 29.3 pg (ref 26.0–34.0)
MCHC: 31.9 g/dL (ref 30.0–36.0)
MCV: 91.9 fL (ref 78.0–100.0)
Platelets: 217 10*3/uL (ref 150–400)
RBC: 4.06 MIL/uL — AB (ref 4.22–5.81)
RDW: 14.5 % (ref 11.5–15.5)
WBC: 6.6 10*3/uL (ref 4.0–10.5)

## 2018-05-22 LAB — BASIC METABOLIC PANEL
ANION GAP: 8 (ref 5–15)
BUN: 21 mg/dL (ref 8–23)
CALCIUM: 8.3 mg/dL — AB (ref 8.9–10.3)
CO2: 22 mmol/L (ref 22–32)
Chloride: 111 mmol/L (ref 98–111)
Creatinine, Ser: 0.92 mg/dL (ref 0.61–1.24)
GLUCOSE: 109 mg/dL — AB (ref 70–99)
POTASSIUM: 3.7 mmol/L (ref 3.5–5.1)
Sodium: 141 mmol/L (ref 135–145)

## 2018-05-22 MED ORDER — PROCHLORPERAZINE EDISYLATE 10 MG/2ML IJ SOLN
5.0000 mg | Freq: Once | INTRAMUSCULAR | Status: AC
  Administered 2018-05-22: 5 mg via INTRAVENOUS
  Filled 2018-05-22: qty 2

## 2018-05-22 MED ORDER — IPRATROPIUM-ALBUTEROL 0.5-2.5 (3) MG/3ML IN SOLN: 3.0000 mL | RESPIRATORY_TRACT | Status: DC | PRN

## 2018-05-22 MED ORDER — SIMETHICONE 80 MG PO CHEW
160.0000 mg | CHEWABLE_TABLET | Freq: Once | ORAL | Status: AC
  Administered 2018-05-22: 160 mg via ORAL
  Filled 2018-05-22: qty 2

## 2018-05-22 NOTE — Progress Notes (Addendum)
PROGRESS NOTE    LORIN GAWRON  ZOX:[id number] DOB: [birth year] DOA: 05/18/2018 PCP: [name], DO   Brief Narrative:  HPI On 05/19/2018 by Dr. Jennette Kettle DAMONE FANCHER is a 84 y.o. male with medical history significant of CAD s/p CABG in 2018, PAF not on anticoagulation due to h/o GIB in past.  Patient presents to ED with onset of Abd pain, distention, N/V onset over past 2 days.  Last BM x3 days ago he says.  Pain is crampy, intermittent.  Vomiting makes it better.  Interim history Admitted for abdominal obstruction, general surgery consulted, status post surgery. Now with abdominal distention and possible ileus- NG tube placed.  Discussed with surgery, they have graciously accepted to take the patient to surgical service. TRH will sign off. Please let us know if we can be of service.  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  Left adrenal lesion is stable compared to prior studies. -General surgery consulted and appreciated, status post exploratory laparotomy right colectomy -Continue IV fluids, pain control and antiemetics as needed -patient with abdominal distention and increased abdominal pain- surgery placing NG tube -Abdominal xray: increased air throughout bowel, increased number of slightly dilated small bowel loops, possible ileus  Nausea -possibly secondary to the above vs ileus -continue antiemetics PRN  Dyspnea -patient with some upper airway congestion on exam -will obtain CXR -suspect SOB may be related to abdominal distenstion -continue incentive spirometry and will order nebs PRN  Coronary artery disease  -status post CABG in May 2018 -Currently chest pain-free -hold statin due to NPO status -continue scheduled IV  metoprolol  Paroxysmal atrial fibrillation -Given n.p.o. status, will place on scheduled IV metoprolol -Patient not on anticoagulation due to history of upper GI bleed  Essential hypertension -Continue IV metoprolol every 6 hours  Acute kidney injury on chronic kidney disease, stage III -Resolved -creatinine peaked at 1.52 -upon review of patient's chart, GFR in stage III since May 2018, baseline creatinine 1.1-1.3 -Currently creatinine 0.92 -Continue IVF and monitor BMP  DVT Prophylaxis  SCDs  Code Status: Full  Family Communication: None at bedside  Disposition Plan: Admitted. Pending improvement.  Consultants General surgery  Procedures  exploratory laparotomy right colectomy  Antibiotics   Anti-infectives (From admission, onward)   Start     Dose/Rate Route Frequency Ordered Stop   05/19/18 2200  cefoTEtan (CEFOTAN) 2 g in sodium chloride 0.9 % 100 mL IVPB     2 g 200 mL/hr over 30 Minutes Intravenous Every 12 hours 05/19/18 1623 05/19/18 2241   05/19/18 0900  cefoTEtan (CEFOTAN) 2 g in sodium chloride 0.9 % 100 mL IVPB     2 g 200 mL/hr over 30 Minutes Intravenous On call to O.R. 05/19/18 0854 05/19/18 1103   05/19/18 0900  cefoTEtan (CEFOTAN) 2 g in sodium chloride 0.9 % 100 mL IVPB  Status:  Discontinued     2 g 200 mL/hr over 30 Minutes Intravenous On call to O.R. 05/19/18 0854 05/19/18 0856      Subjective:   Ward Givens seen and examined today.  He is to complain of abdominal soreness and distention.  Has not had any gas passage since yesterday.  Does complain of some nausea.  Has had some cough with some phlegm production.  Denies current chest  pain, dizziness or headache.  Objective:   Vitals:   05/21/18 0418 05/21/18 1246 05/21/18 2025 05/22/18 0633  BP: 138/90 (!) 159/89 (!) 150/91 140/77  Pulse: (!) 113 (!) 103 (!) 113 90  Resp: 18 18 20 20   Temp: 97.6 F (36.4 C) 97.7 F (36.5 C) 97.9 F (36.6 C) 98 F (36.7 C)  TempSrc: Oral Oral Oral  Oral  SpO2: 96% 98% 95% 94%  Weight:      Height:        Intake/Output Summary (Last 24 hours) at 05/22/2018 1018 Last data filed at 05/22/2018 3428 Gross per 24 hour  Intake 2336.17 ml  Output 600 ml  Net 1736.17 ml   Filed Weights   05/18/18 1822 05/19/18 0057 05/19/18 0957  Weight: 98.7 kg 97.9 kg 97.5 kg   Exam  General: Well developed, well nourished, NAD, appears stated age  60: NCAT,  mucous membranes moist.   Neck: Supple  Cardiovascular: S1 S2 auscultated, no rubs, murmurs or gallops. Regular rate and rhythm.  Respiratory: Upper airway congestion/crackles, otherwise clear  Abdomen: Soft, TTP, distended, + few trickling bowel sounds  Extremities: warm dry without cyanosis clubbing or edema  Neuro: AAOx3, nonfocal  Skin: Without rashes exudates or nodules  Psych: Pleasant, appropriate mood and affect   Data Reviewed: I have personally reviewed following labs and imaging studies  CBC: Recent Labs  Lab 05/18/18 1851 05/20/18 0514 05/21/18 0441 05/22/18 0516  WBC 10.1 6.5 6.9 6.6  NEUTROABS 9.1*  --   --   --   HGB 16.2 12.6* 12.9* 11.9*  HCT 47.6 40.1 39.9 37.3*  MCV 87.5 92.8 91.7 91.9  PLT 216 212 203 768   Basic Metabolic Panel: Recent Labs  Lab 05/18/18 1851 05/19/18 0429 05/20/18 0514 05/21/18 0441 05/22/18 0516  NA 138 141 141 141 141  K 3.9 3.5 4.3 4.0 3.7  CL 97* 104 105 108 111  CO2 26 27 26 26 22   GLUCOSE 183* 156* 171* 112* 109*  BUN 15 18 26* 25* 21  CREATININE 1.14 1.00 1.52* 1.13 0.92  CALCIUM 9.8 9.1 8.3* 8.3* 8.3*  MG  --   --   --  2.0  --    GFR: Estimated Creatinine Clearance: 71.2 mL/min (by C-G formula based on SCr of 0.92 mg/dL). Liver Function Tests: Recent Labs  Lab 05/18/18 1851  AST 28  ALT 11  ALKPHOS 90  BILITOT 0.9  PROT 7.2  ALBUMIN 4.2   Recent Labs  Lab 05/18/18 1851  LIPASE 38   No results for input(s): AMMONIA in the last 168 hours. Coagulation Profile: No results for input(s): INR,  PROTIME in the last 168 hours. Cardiac Enzymes: No results for input(s): CKTOTAL, CKMB, CKMBINDEX, TROPONINI in the last 168 hours. BNP (last 3 results) No results for input(s): PROBNP in the last 8760 hours. HbA1C: No results for input(s): HGBA1C in the last 72 hours. CBG: Recent Labs  Lab 05/19/18 0957  GLUCAP 132*   Lipid Profile: No results for input(s): CHOL, HDL, LDLCALC, TRIG, CHOLHDL, LDLDIRECT in the last 72 hours. Thyroid Function Tests: No results for input(s): TSH, T4TOTAL, FREET4, T3FREE, THYROIDAB in the last 72 hours. Anemia Panel: No results for input(s): VITAMINB12, FOLATE, FERRITIN, TIBC, IRON, RETICCTPCT in the last 72 hours. Urine analysis:    Component Value Date/Time   COLORURINE STRAW (A) 02/14/2017 2049   APPEARANCEUR CLEAR 02/14/2017 2049   LABSPEC 1.006 02/14/2017 2049   PHURINE 6.0 02/14/2017 2049   GLUCOSEU NEGATIVE  02/14/2017 2049   GLUCOSEU NEGATIVE 08/11/2016 [place] 02/14/2017 2049   [place] 02/14/2017 2049   [place] 02/14/2017 2049   PROTEINUR NEGATIVE 02/14/2017 2049   UROBILINOGEN 1.0 08/11/2016 1521   NITRITE NEGATIVE 02/14/2017 2049   LEUKOCYTESUR NEGATIVE 02/14/2017 2049   Sepsis Labs: @LABRCNTIP (procalcitonin:4,lacticidven:4)  )No results found for this or any previous visit (from the past 240 hour(s)).    Radiology Studies: Dg Abd 1 View  Result Date: 05/22/2018 CLINICAL DATA:  Abdominal distention. Status post right colectomy for obstructing hepatic flexure lesion of the colon. EXAM: ABDOMEN - 1 VIEW COMPARISON:  Radiographs dated 05/19/2018 and CT scan dated 05/18/2018 FINDINGS: There is increased air throughout the bowel with an increased number of slightly distended small bowel loops. Given the patient's recent surgery, this probably represents an ileus. The colon is not distended. IMPRESSION: Increased air throughout the bowel with an increased number slightly dilated small bowel loops,  most likely ileus. Electronically Signed   By: Lorriane Shire M.D.   On: 05/22/2018 08:30     Scheduled Meds: . enoxaparin (LOVENOX) injection  40 mg Subcutaneous Q24H  . gabapentin  100 mg Oral BID  . metoprolol tartrate  2.5 mg Intravenous Q6H  . pantoprazole  20 mg Oral Daily   Continuous Infusions: . sodium chloride 100 mL/hr at 05/22/18 0856  . lactated ringers Stopped (05/19/18 2211)     LOS: 4 days   Time Spent in minutes   30 minutes  Maxi Carreras D.O. on 05/22/2018 at 10:18 AM  Between 7am to 7pm - Pager - [phone]  After 7pm go to [email or web address] - password [email or web address]  And look for the night coverage person covering for me after hours  [facility] Office  [phone]

## 2018-05-22 NOTE — Progress Notes (Signed)
[facility] Progress Note  3 Days Post-Op  Subjective: CC: abdominal pain  Patient reports generalized pain and distention. Passed some flatus initially, not passing flatus now. Some nausea.   Objective: Vital signs in last 24 hours: Temp:  [97.7 F (36.5 C)-98 F (36.7 C)] 98 F (36.7 C) (08/12 8242) Pulse Rate:  [90-113] 90 (08/12 0633) Resp:  [18-20] 20 (08/12 0633) BP: (140-159)/(77-91) 140/77 (08/12 0633) SpO2:  [94 %-98 %] 94 % (08/12 0633) Last BM Date: 05/16/18  Intake/Output from previous day: 08/11 0701 - 08/12 0700 In: 2336.2 [I.V.:2336.2] Out: 700 [Urine:700] Intake/Output this shift: No intake/output data recorded.  PE: Gen:  Alert, NAD, pleasant Card:  Regular rate and rhythm, pedal pulses 2+ BL Pulm:  Normal effort, crackles in bases bilaterally  Abd: Soft, appropriately tender, distended, bowel sounds high-pitched and tinkling, no HSM, incision C/D/I with staples present  Skin: warm and dry, no rashes  Psych: A&Ox3   Lab Results:  Recent Labs    05/21/18 0441 05/22/18 0516  WBC 6.9 6.6  HGB 12.9* 11.9*  HCT 39.9 37.3*  PLT 203 217   BMET Recent Labs    05/21/18 0441 05/22/18 0516  NA 141 141  K 4.0 3.7  CL 108 111  CO2 26 22  GLUCOSE 112* 109*  BUN 25* 21  CREATININE 1.13 0.92  CALCIUM 8.3* 8.3*   PT/INR No results for input(s): LABPROT, INR in the last 72 hours. CMP     Component Value Date/Time   NA 141 05/22/2018 0516   NA 139 01/17/2018 1401   K 3.7 05/22/2018 0516   CL 111 05/22/2018 0516   CO2 22 05/22/2018 0516   GLUCOSE 109 (H) 05/22/2018 0516   BUN 21 05/22/2018 0516   BUN 13 01/17/2018 1401   CREATININE 0.92 05/22/2018 0516   CALCIUM 8.3 (L) 05/22/2018 0516   PROT 7.2 05/18/2018 1851   PROT 6.4 01/17/2018 1401   ALBUMIN 4.2 05/18/2018 1851   ALBUMIN 4.2 01/17/2018 1401   AST 28 05/18/2018 1851   ALT 11 05/18/2018 1851   ALKPHOS 90 05/18/2018 1851   BILITOT 0.9 05/18/2018 1851   BILITOT 0.3  01/17/2018 1401   GFRNONAA >60 05/22/2018 0516   GFRAA >60 05/22/2018 0516   Lipase     Component Value Date/Time   LIPASE 38 05/18/2018 1851       Studies/Results: No results found.  Anti-infectives: Anti-infectives (From admission, onward)   Start     Dose/Rate Route Frequency Ordered Stop   05/19/18 2200  cefoTEtan (CEFOTAN) 2 g in sodium chloride 0.9 % 100 mL IVPB     2 g 200 mL/hr over 30 Minutes Intravenous Every 12 hours 05/19/18 1623 05/19/18 2241   05/19/18 0900  cefoTEtan (CEFOTAN) 2 g in sodium chloride 0.9 % 100 mL IVPB     2 g 200 mL/hr over 30 Minutes Intravenous On call to O.R. 05/19/18 0854 05/19/18 1103   05/19/18 0900  cefoTEtan (CEFOTAN) 2 g in sodium chloride 0.9 % 100 mL IVPB  Status:  Discontinued     2 g 200 mL/hr over 30 Minutes Intravenous On call to O.R. 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 Post-operative ileus - POD#3 - KUB this AM with dilated small bowel - insert NGT for decompression - mobilize  - ok with ice chips for comfort - patient may shower with incision uncovered   FEN: NPO, IVF VTE: SCDs, lovenox ID: [email or web address] periop  LOS: 4 days    Brigid Re , [facility] 05/22/2018, 8:14 AM Pager: [phone] Consults: [phone] Mon-Fri 7:00 am-4:30 pm Sat-Sun 7:00 am-11:30 am

## 2018-05-23 ENCOUNTER — Inpatient Hospital Stay (HOSPITAL_COMMUNITY): Payer: Medicare HMO

## 2018-05-23 LAB — BASIC METABOLIC PANEL
Anion gap: 10 (ref 5–15)
BUN: 17 mg/dL (ref 8–23)
CALCIUM: 8.3 mg/dL — AB (ref 8.9–10.3)
CO2: 23 mmol/L (ref 22–32)
Chloride: 111 mmol/L (ref 98–111)
Creatinine, Ser: 0.83 mg/dL (ref 0.61–1.24)
GFR calc non Af Amer: >60 mL/min (ref >60)
Glucose, Bld: 93 mg/dL (ref 70–99)
POTASSIUM: 3.6 mmol/L (ref 3.5–5.1)
Sodium: 144 mmol/L (ref 135–145)

## 2018-05-23 LAB — CBC
HEMATOCRIT: 37.3 % — AB (ref 39.0–52.0)
Hemoglobin: 12 g/dL — ABNORMAL LOW (ref 13.0–17.0)
MCH: 29.2 pg (ref 26.0–34.0)
MCHC: 32.2 g/dL (ref 30.0–36.0)
MCV: 90.8 fL (ref 78.0–100.0)
Platelets: 227 10*3/uL (ref 150–400)
RBC: 4.11 MIL/uL — AB (ref 4.22–5.81)
RDW: 14.3 % (ref 11.5–15.5)
WBC: 6.1 10*3/uL (ref 4.0–10.5)

## 2018-05-23 MED ORDER — METOPROLOL TARTRATE 5 MG/5ML IV SOLN
5.0000 mg | Freq: Four times a day (QID) | INTRAVENOUS | Status: DC
  Administered 2018-05-24 (×3): 5 mg via INTRAVENOUS
  Filled 2018-05-23 (×3): qty 5

## 2018-05-23 MED ORDER — METOPROLOL TARTRATE 5 MG/5ML IV SOLN: 5.0000 mg | Freq: Four times a day (QID) | INTRAVENOUS | Status: DC

## 2018-05-23 MED ORDER — POTASSIUM CHLORIDE IN NACL 20-0.45 MEQ/L-% IV SOLN
INTRAVENOUS | Status: DC
  Administered 2018-05-23 – 2018-05-27 (×10): via INTRAVENOUS
  Administered 2018-05-28: 1000 mL via INTRAVENOUS
  Administered 2018-05-29: 05:00:00 via INTRAVENOUS
  Filled 2018-05-23 (×13): qty 1000

## 2018-05-23 MED ORDER — HYDRALAZINE HCL 20 MG/ML IJ SOLN
10.0000 mg | Freq: Four times a day (QID) | INTRAMUSCULAR | Status: DC | PRN
  Administered 2018-05-23: 10 mg via INTRAVENOUS
  Filled 2018-05-23: qty 1

## 2018-05-23 MED ORDER — METOPROLOL TARTRATE 5 MG/5ML IV SOLN
5.0000 mg | Freq: Once | INTRAVENOUS | Status: DC
  Filled 2018-05-23 (×2): qty 5

## 2018-05-23 MED ORDER — FAMOTIDINE IN NACL 20-0.9 MG/50ML-% IV SOLN
20.0000 mg | Freq: Two times a day (BID) | INTRAVENOUS | Status: DC
  Administered 2018-05-23 – 2018-05-27 (×9): 20 mg via INTRAVENOUS
  Filled 2018-05-23 (×9): qty 50

## 2018-05-23 MED ORDER — HYDRALAZINE HCL 20 MG/ML IJ SOLN
5.0000 mg | Freq: Four times a day (QID) | INTRAMUSCULAR | Status: DC | PRN
  Administered 2018-05-23: 5 mg via INTRAVENOUS
  Filled 2018-05-23: qty 1

## 2018-05-23 MED ORDER — POTASSIUM CHLORIDE 10 MEQ/100ML IV SOLN
10.0000 meq | INTRAVENOUS | Status: DC
  Administered 2018-05-23 (×2): 10 meq via INTRAVENOUS
  Filled 2018-05-23 (×2): qty 100

## 2018-05-23 NOTE — Progress Notes (Signed)
PT Cancellation Note  Patient Details Name: Vincent Mcclure MRN: [id number] DOB: [birth year]   Cancelled Treatment:     amb with nursing in am then pm back to bed soundly sleeping.  Pt has been evaluated with no post acute PT needs but will continue to monitor for mobility during acute hospital stay.    Rica Koyanagi  PTA [facility]  Acute  Rehab Pager      [phone]

## 2018-05-23 NOTE — Progress Notes (Signed)
[facility] Progress Note  4 Days Post-Op  Subjective: CC: ileus Patient very argumentative about how many ice chips he should be taking in. Attempted to explain that large volume of ice chips was likely contributing to continued distention and high output from NGT, but patient disagreed. Some HTN overnight. Pain and distention about the same. No flatus. Walked once yesterday because he was told he could not get up with NGT.   Objective: Vital signs in last 24 hours: Temp:  [97.7 F (36.5 C)-98.6 F (37 C)] 97.7 F (36.5 C) (08/13 0507) Pulse Rate:  [94-108] 94 (08/13 0624) Resp:  [17-18] 17 (08/13 0507) BP: (154-173)/(93-104) 155/93 (08/13 0624) SpO2:  [94 %-96 %] 95 % (08/13 0624) Last BM Date: 05/16/18  Intake/Output from previous day: 08/12 0701 - 08/13 0700 In: 2521.9 [I.V.:2161.9; NG/GT:360] Out: 3550 [Urine:750; Emesis/NG output:2800] Intake/Output this shift: Total I/O In: -  Out: 100 [Urine:100]  PE: Gen:  Alert, NAD, pleasant Card:  Regular rate and rhythm, pedal pulses 2+ BL Pulm:  Normal effort, crackles in bases bilaterally  Abd: Soft, appropriately tender, distended, bowel sounds high-pitched and tinkling, no HSM, incision C/D/I with staples present  Skin: warm and dry, no rashes  Psych: A&Ox3   Lab Results:  Recent Labs    05/22/18 0516 05/23/18 0456  WBC 6.6 6.1  HGB 11.9* 12.0*  HCT 37.3* 37.3*  PLT 217 227   BMET Recent Labs    05/22/18 0516 05/23/18 0456  NA 141 144  K 3.7 3.6  CL 111 111  CO2 22 23  GLUCOSE 109* 93  BUN 21 17  CREATININE 0.92 0.83  CALCIUM 8.3* 8.3*   PT/INR No results for input(s): LABPROT, INR in the last 72 hours. CMP     Component Value Date/Time   NA 144 05/23/2018 0456   NA 139 01/17/2018 1401   K 3.6 05/23/2018 0456   CL 111 05/23/2018 0456   CO2 23 05/23/2018 0456   GLUCOSE 93 05/23/2018 0456   BUN 17 05/23/2018 0456   BUN 13 01/17/2018 1401   CREATININE 0.83 05/23/2018 0456   CALCIUM  8.3 (L) 05/23/2018 0456   PROT 7.2 05/18/2018 1851   PROT 6.4 01/17/2018 1401   ALBUMIN 4.2 05/18/2018 1851   ALBUMIN 4.2 01/17/2018 1401   AST 28 05/18/2018 1851   ALT 11 05/18/2018 1851   ALKPHOS 90 05/18/2018 1851   BILITOT 0.9 05/18/2018 1851   BILITOT 0.3 01/17/2018 1401   GFRNONAA >60 05/23/2018 0456   GFRAA >60 05/23/2018 0456   Lipase     Component Value Date/Time   LIPASE 38 05/18/2018 1851       Studies/Results: Dg Abd 1 View  Result Date: 05/22/2018 CLINICAL DATA:  Status post NG tube placement today. EXAM: ABDOMEN - 1 VIEW COMPARISON:  Plain films of the abdomen earlier today. FINDINGS: NG tube is in place with both the tip and side-port in the stomach. Diffuse gaseous distension of bowel is seen as on the prior exam. Surgical staples in the midline are noted. IMPRESSION: NG tube in good position. Mild diffuse gaseous distention of bowel likely due to ileus. Electronically Signed   By: Inge Rise M.D.   On: 05/22/2018 11:15   Dg Abd 1 View  Result Date: 05/22/2018 CLINICAL DATA:  Abdominal distention. Status post right colectomy for obstructing hepatic flexure lesion of the colon. EXAM: ABDOMEN - 1 VIEW COMPARISON:  Radiographs dated 05/19/2018 and CT scan dated 05/18/2018 FINDINGS: There is increased  air throughout the bowel with an increased number of slightly distended small bowel loops. Given the patient's recent surgery, this probably represents an ileus. The colon is not distended. IMPRESSION: Increased air throughout the bowel with an increased number slightly dilated small bowel loops, most likely ileus. Electronically Signed   By: Lorriane Shire M.D.   On: 05/22/2018 08:30   Dg Chest Port 1 View  Result Date: 05/22/2018 CLINICAL DATA:  Status post NG tube placement today. Shortness of breath and abdominal pain. EXAM: PORTABLE CHEST 1 VIEW COMPARISON:  Plain films chest and abdomen 05/18/2018. FINDINGS: NG tube courses into the stomach and below the inferior  margin of the film. Lungs are clear. Heart size is normal. Aortic atherosclerosis is noted. The patient is status post CABG. No pneumothorax or pleural effusion. No acute bony abnormality. IMPRESSION: NG tube courses into the stomach and below the inferior margin of the film. No acute disease. Atherosclerosis. Electronically Signed   By: Inge Rise M.D.   On: 05/22/2018 11:14   Dg Abd Portable 1v  Result Date: 05/23/2018 CLINICAL DATA:  Postoperative ileus EXAM: PORTABLE ABDOMEN - 1 VIEW COMPARISON:  05/22/2018 FINDINGS: NG tube tip remains in the stomach. Diffuse gaseous distention of bowel again noted, likely ileus, unchanged. IMPRESSION: No significant change since prior study. Electronically Signed   By: Rolm Baptise M.D.   On: 05/23/2018 07:15    Anti-infectives: Anti-infectives (From admission, onward)   Start     Dose/Rate Route Frequency Ordered Stop   05/19/18 2200  cefoTEtan (CEFOTAN) 2 g in sodium chloride 0.9 % 100 mL IVPB     2 g 200 mL/hr over 30 Minutes Intravenous Every 12 hours 05/19/18 1623 05/19/18 2241   05/19/18 0900  cefoTEtan (CEFOTAN) 2 g in sodium chloride 0.9 % 100 mL IVPB     2 g 200 mL/hr over 30 Minutes Intravenous On call to O.R. 05/19/18 0854 05/19/18 1103   05/19/18 0900  cefoTEtan (CEFOTAN) 2 g in sodium chloride 0.9 % 100 mL IVPB  Status:  Discontinued     2 g 200 mL/hr over 30 Minutes Intravenous On call to O.R. 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 Post-operative ileus - POD#4 - KUB this AM with dilated small bowel - continue NGT for decompression, may clamp for pt to walk  - NGT with almoset 3L out  in 24h - this is skewed from PO intake, discussed with RN to limit pt to 1 cup ice chips q8h - mobilize!!!! - patient may shower with incision uncovered  Hypokalemia - K 3.6 this AM, goal of 4.0 in ileus, replace IV  FEN: NPO, IVF; NGT to LIWS VTE: SCDs, lovenox ID: [email or web address] periop  LOS: 5 days    Brigid Re , [facility] 05/23/2018, 7:38 AM Pager: [phone] Consults: [phone] Mon-Fri 7:00 am-4:30 pm Sat-Sun 7:00 am-11:30 am

## 2018-05-23 NOTE — Care Management Important Message (Signed)
Important Message  Patient Details  Name: DORAN NESTLE MRN: [id number] Date of Birth: 04-Oct-1932   Medicare Important Message Given:  Yes    Kerin Salen 05/23/2018, 11:46 AMImportant Message  Patient Details  Name: JOACHIM CARTON MRN: [id number] Date of Birth: 1932/09/15   Medicare Important Message Given:  Yes    Kerin Salen 05/23/2018, 11:46 AM

## 2018-05-24 ENCOUNTER — Inpatient Hospital Stay (HOSPITAL_COMMUNITY): Payer: Medicare HMO

## 2018-05-24 DIAGNOSIS — D500 Iron deficiency anemia secondary to blood loss (chronic): Secondary | ICD-10-CM

## 2018-05-24 DIAGNOSIS — I2510 Atherosclerotic heart disease of native coronary artery without angina pectoris: Secondary | ICD-10-CM

## 2018-05-24 DIAGNOSIS — Z951 Presence of aortocoronary bypass graft: Secondary | ICD-10-CM

## 2018-05-24 DIAGNOSIS — K56609 Unspecified intestinal obstruction, unspecified as to partial versus complete obstruction: Secondary | ICD-10-CM

## 2018-05-24 DIAGNOSIS — Z87891 Personal history of nicotine dependence: Secondary | ICD-10-CM

## 2018-05-24 DIAGNOSIS — C182 Malignant neoplasm of ascending colon: Secondary | ICD-10-CM

## 2018-05-24 DIAGNOSIS — Z808 Family history of malignant neoplasm of other organs or systems: Secondary | ICD-10-CM

## 2018-05-24 LAB — CBC
HCT: 37.9 % — ABNORMAL LOW (ref 39.0–52.0)
Hemoglobin: 12.1 g/dL — ABNORMAL LOW (ref 13.0–17.0)
MCH: 28.9 pg (ref 26.0–34.0)
MCHC: 31.9 g/dL (ref 30.0–36.0)
MCV: 90.7 fL (ref 78.0–100.0)
PLATELETS: 238 10*3/uL (ref 150–400)
RBC: 4.18 MIL/uL — ABNORMAL LOW (ref 4.22–5.81)
RDW: 14.6 % (ref 11.5–15.5)
WBC: 6.7 10*3/uL (ref 4.0–10.5)

## 2018-05-24 LAB — BASIC METABOLIC PANEL
ANION GAP: 14 (ref 5–15)
Anion gap: 13 (ref 5–15)
BUN: 18 mg/dL (ref 8–23)
BUN: 17 mg/dL (ref 8–23)
CO2: 18 mmol/L — ABNORMAL LOW (ref 22–32)
CO2: 19 mmol/L — ABNORMAL LOW (ref 22–32)
CREATININE: 0.85 mg/dL (ref 0.61–1.24)
CREATININE: 0.88 mg/dL (ref 0.61–1.24)
Calcium: 8.5 mg/dL — ABNORMAL LOW (ref 8.9–10.3)
Calcium: 8.7 mg/dL — ABNORMAL LOW (ref 8.9–10.3)
Chloride: 114 mmol/L — ABNORMAL HIGH (ref 98–111)
Chloride: 112 mmol/L — ABNORMAL HIGH (ref 98–111)
GFR calc Af Amer: >60 mL/min (ref >60)
GLUCOSE: 84 mg/dL (ref 70–99)
Glucose, Bld: 85 mg/dL (ref 70–99)
Potassium: 3.8 mmol/L (ref 3.5–5.1)
Potassium: 4.1 mmol/L (ref 3.5–5.1)
SODIUM: 145 mmol/L (ref 135–145)
Sodium: 145 mmol/L (ref 135–145)

## 2018-05-24 MED ORDER — METOPROLOL TARTRATE 5 MG/5ML IV SOLN
10.0000 mg | Freq: Three times a day (TID) | INTRAVENOUS | Status: DC
  Administered 2018-05-24 – 2018-05-26 (×5): 10 mg via INTRAVENOUS
  Filled 2018-05-24 (×5): qty 10

## 2018-05-24 MED ORDER — PHENOL 1.4 % MT LIQD
1.0000 | OROMUCOSAL | Status: DC | PRN
  Filled 2018-05-24: qty 177

## 2018-05-24 NOTE — Progress Notes (Addendum)
[facility] Progress Note  5 Days Post-Op  Subjective: CC: dry mouth Patient still very argumentative about ice chips. Attempted again to explain why ice chips should be limited, and after over an hour patient seems to have come to somewhat of an agreement with me. Patient does report that his abdomen feels less distended and pain is a little better. He walked 1-2x yesterday and would like to walk more today. Discussed pathology results. HTN still an issue, will work on control of this today. Pt also insistent that they tried to place a PICC line overnight, I explained to him that what was attempted was a midline IV but patient insistent that it was a PICC. It is documented in patient's chart that a midline was attempted. Patient also complaining that IVs infiltrated yesterday and he has redness and swelling around old IV sites.   Objective: Vital signs in last 24 hours: Temp:  [97.6 F (36.4 C)-98.2 F (36.8 C)] 98 F (36.7 C) (08/14 0430) Pulse Rate:  [89-118] 98 (08/14 0651) Resp:  [14-20] 14 (08/14 0430) BP: (135-168)/(73-102) 140/87 (08/14 0651) SpO2:  [90 %-95 %] 92 % (08/14 0430) Last BM Date: 05/16/18  Intake/Output from previous day: 08/13 0701 - 08/14 0700 In: 1696.2 [I.V.:1508.7; IV Piggyback:187.5] Out: 2150 [Urine:2150] Intake/Output this shift: No intake/output data recorded.  PE: Gen: Alert, NAD,  Card: Regular rate and rhythm Pulm: Normal effort, crackles in bases bilaterally Abd: Soft,appropriatelytender, distended, bowel soundshypoactive but less high-pitched today, no HSM, incision C/D/Iwith staples present Skin: warm and dry, no rashes  Psych: A&Ox3   Lab Results:  Recent Labs    05/23/18 0456 05/24/18 0359  WBC 6.1 6.7  HGB 12.0* 12.1*  HCT 37.3* 37.9*  PLT 227 238   BMET Recent Labs    05/23/18 0456 05/24/18 0359  NA 144 145  K 3.6 3.8  CL 111 114*  CO2 23 18*  GLUCOSE 93 85  BUN 17 18  CREATININE 0.83 0.85  CALCIUM  8.3* 8.5*   PT/INR No results for input(s): LABPROT, INR in the last 72 hours. CMP     Component Value Date/Time   NA 145 05/24/2018 0359   NA 139 01/17/2018 1401   K 3.8 05/24/2018 0359   CL 114 (H) 05/24/2018 0359   CO2 18 (L) 05/24/2018 0359   GLUCOSE 85 05/24/2018 0359   BUN 18 05/24/2018 0359   BUN 13 01/17/2018 1401   CREATININE 0.85 05/24/2018 0359   CALCIUM 8.5 (L) 05/24/2018 0359   PROT 7.2 05/18/2018 1851   PROT 6.4 01/17/2018 1401   ALBUMIN 4.2 05/18/2018 1851   ALBUMIN 4.2 01/17/2018 1401   AST 28 05/18/2018 1851   ALT 11 05/18/2018 1851   ALKPHOS 90 05/18/2018 1851   BILITOT 0.9 05/18/2018 1851   BILITOT 0.3 01/17/2018 1401   GFRNONAA >60 05/24/2018 0359   GFRAA >60 05/24/2018 0359   Lipase     Component Value Date/Time   LIPASE 38 05/18/2018 1851       Studies/Results: Dg Abd 1 View  Result Date: 05/22/2018 CLINICAL DATA:  Status post NG tube placement today. EXAM: ABDOMEN - 1 VIEW COMPARISON:  Plain films of the abdomen earlier today. FINDINGS: NG tube is in place with both the tip and side-port in the stomach. Diffuse gaseous distension of bowel is seen as on the prior exam. Surgical staples in the midline are noted. IMPRESSION: NG tube in good position. Mild diffuse gaseous distention of bowel likely due to ileus.  Electronically Signed   By: Inge Rise M.D.   On: 05/22/2018 11:15   Dg Chest Port 1 View  Result Date: 05/22/2018 CLINICAL DATA:  Status post NG tube placement today. Shortness of breath and abdominal pain. EXAM: PORTABLE CHEST 1 VIEW COMPARISON:  Plain films chest and abdomen 05/18/2018. FINDINGS: NG tube courses into the stomach and below the inferior margin of the film. Lungs are clear. Heart size is normal. Aortic atherosclerosis is noted. The patient is status post CABG. No pneumothorax or pleural effusion. No acute bony abnormality. IMPRESSION: NG tube courses into the stomach and below the inferior margin of the film. No acute  disease. Atherosclerosis. Electronically Signed   By: Inge Rise M.D.   On: 05/22/2018 11:14   Dg Abd Portable 1v  Result Date: 05/23/2018 CLINICAL DATA:  Postoperative ileus EXAM: PORTABLE ABDOMEN - 1 VIEW COMPARISON:  05/22/2018 FINDINGS: NG tube tip remains in the stomach. Diffuse gaseous distention of bowel again noted, likely ileus, unchanged. IMPRESSION: No significant change since prior study. Electronically Signed   By: Rolm Baptise M.D.   On: 05/23/2018 07:15    Anti-infectives: Anti-infectives (From admission, onward)   Start     Dose/Rate Route Frequency Ordered Stop   05/19/18 2200  cefoTEtan (CEFOTAN) 2 g in sodium chloride 0.9 % 100 mL IVPB     2 g 200 mL/hr over 30 Minutes Intravenous Every 12 hours 05/19/18 1623 05/19/18 2241   05/19/18 0900  cefoTEtan (CEFOTAN) 2 g in sodium chloride 0.9 % 100 mL IVPB     2 g 200 mL/hr over 30 Minutes Intravenous On call to O.R. 05/19/18 0854 05/19/18 1103   05/19/18 0900  cefoTEtan (CEFOTAN) 2 g in sodium chloride 0.9 % 100 mL IVPB  Status:  Discontinued     2 g 200 mL/hr over 30 Minutes Intravenous On call to O.R. 05/19/18 4709 05/19/18 0856       Assessment/Plan HTN - scheduled IV metoprolol, prn Hydralazine  HLD CAD - s/p CABG 2018, stable Paroxysmal atrial fibrillation - on scheduled IV metoprolol, no anticoagulation due to hx of GI bleeding Acute on Chronic CKD stage III - Cr 0.85, resolved GERD  Adenocarcinoma of the colon ([id number]) S/p exploratory laparotomy with right colectomy and primary anastomosis 05/19/18 [name] Post-operative ileus - POD#5 -KUB this AM with dilated small bowel - continue NGT for decompression, may clamp for pt to walk  - NGT output not recorded overnight but patient reports total of 5 cannisters since NGT was placed - discussed with RN ice and recording strict I/O today  - mobilize!!!! - patient may shower with incision uncovered Hypokalemia - K 3.8 this AM, goal of 4.0 in ileus,  continue to replace via IVF Phlebitis - ice/heat to affected areas prn - offered patient a PICC line and he refused.   FEN: NPO, IVF; NGT to LIWS VTE: SCDs, lovenox ID: [email or web address] periop  Continue current care. Mobilize more today. Continue to monitor electrolytes. Repeat KUB in the AM.   LOS: 6 days    Brigid Re , [facility] 05/24/2018, 8:55 AM Pager: [phone] Consults: [phone] Mon-Fri 7:00 am-4:30 pm Sat-Sun 7:00 am-11:30 am

## 2018-05-24 NOTE — Progress Notes (Signed)
Patient's left arm PIV infiltrated and after assessment of both arms, recommended midline due to patient poor vasculature. RN  received order from Dr. Robyne Askew placement attempted but unsuccessful. Patient RN notified.

## 2018-05-24 NOTE — Consult Note (Signed)
[place]  [phone]   HEMATOLOGY ONCOLOGY INPATIENT CONSULTATION   Vincent Mcclure  DOB: 27-Oct-1931  MR#: [id number]  CSN#: [id number]    Requesting Physician: [facility]  Patient Care Team: [name], DO as PCP - General (Family Medicine) Pyrtle, Lajuan Lines, MD as Consulting Physician (Gastroenterology) Allyn Kenner, MD as Consulting Physician (Dermatology) Dorothy Spark, MD as Consulting Physician (Cardiology) Levin Erp, [place] as Physician Assistant (Gastroenterology) Imogene Burn, PA-C as Physician Assistant (Cardiology)   Reason for consult:  Newly diagnosed Adenocarcinoma of the right colon  History of present illness:    Vincent Mcclure is a 84 y.o. male who was admitted to the hospital on 05/18/2018 due to abdominal pain and constipation. He also vomited multiple times and was told he had a bowel obstruction. He was eventually diagnosed with adenocarcinoma of the colon with metastasis to one LNs. On 05/19/2018 he had an exploratory laparotomy with right colectomy. Today, I see him at the hospital with his wife. He is frustrated from the pain and is feeling down. He is bloated and his abdomen is distended. He is hard of hearing. His wife states that he had constipation before his ER visit and lost weight purposefully.  He had a CABG. He also had back surgery.  He started walking a mile/day after his CABG, but his legs started cramping and therefore, his activity now is more restricted. He is still trying to be active.    MEDICAL HISTORY:  Past Medical History:  Diagnosis Date  . Alcohol abuse   . Anemia   . Chronic low back pain 10/14/2011  . Colon polyps    hyperplastic (2004, 2010) and adenomatous (1990).    . COLONIC POLYPS, HX OF 03/05/2008  . Esophageal stricture    hx of  . Gastric ulcer   . GERD 03/05/2008  . GERD (gastroesophageal reflux disease) 1994   associated peptic strictures  . HYPERLIPIDEMIA 03/05/2008    . HYPERTENSION 03/05/2008  . Hypertension   . IBS 03/05/2008  . Impaired glucose tolerance 10/12/2011  . Iron deficiency anemia   . LEG CRAMPS 09/02/2010  . PAD (peripheral artery disease) ([place]) 10/14/2011  . PAD (peripheral artery disease) ([place]) 2013  . PERIPHERAL EDEMA 09/02/2010  . PERIPHERAL NEUROPATHY 03/05/2008  . Prostatitis    hx of  . VARICOSE VEINS, LOWER EXTREMITIES 06/09/2009    SURGICAL HISTORY: Past Surgical History:  Procedure Laterality Date  . CATARACT EXTRACTION  bilat  . CORONARY ARTERY BYPASS GRAFT N/A 02/15/2017   Procedure: CORONARY ARTERY BYPASS GRAFTING times three  with left internal mammary harvest and endoscopic harvest of Right SVG. Grafts of LIMA to  LAD, SVG to Distal Circ, and to First Diag.;  Surgeon: Grace Isaac, MD;  Location: [facility];  Service: Open Heart Surgery;  Laterality: N/A;  . ESOPHAGOGASTRODUODENOSCOPY N/A 11/14/2014   Procedure: ESOPHAGOGASTRODUODENOSCOPY (EGD);  Surgeon: Jerene Bears, MD;  Location: [facility] ENDOSCOPY;  Service: Endoscopy;  Laterality: N/A;  . LAPAROTOMY N/A 05/19/2018   Procedure: EXPLORATORY LAPAROTOMY RIGHT COLECTOMY;  Surgeon: Fanny Skates, MD;  Location: [facility] ORS;  Service: General;  Laterality: N/A;  . LEFT HEART CATH AND CORONARY ANGIOGRAPHY N/A 02/14/2017   Procedure: Left Heart Cath and Coronary Angiography;  Surgeon: Belva Crome, MD;  Location: [facility] CV LAB;  Service: Cardiovascular;  Laterality: N/A;  . LUMBAR SPINE SURGERY  11/2008   Dr Joya Salm  . TEE WITHOUT CARDIOVERSION N/A 02/15/2017   Procedure:  TRANSESOPHAGEAL ECHOCARDIOGRAM (TEE);  Surgeon: Grace Isaac, MD;  Location: [place];  Service: Open Heart Surgery;  Laterality: N/A;    SOCIAL HISTORY: Social History   Socioeconomic History  . Marital status: Married    Spouse name: Not on file  . Number of children: 4  . Years of education: Not on file  . Highest education level: Not on file  Occupational History  . Occupation: Retired Music therapist  . Financial resource strain: Not on file  . Food insecurity:    Worry: Not on file    Inability: Not on file  . Transportation needs:    Medical: Not on file    Non-medical: Not on file  Tobacco Use  . Smoking status: Former Smoker    Packs/day: 1.25    Years: 50.00    Pack years: 62.50    Types: Cigarettes    Last attempt to quit: 11/11/2008    Years since quitting: 9.5  . Smokeless tobacco: Former Systems developer    Types: Chew  Substance and Sexual Activity  . Alcohol use: Yes    Alcohol/week: 6.0 standard drinks    Types: 2 Glasses of wine, 4 Cans of beer per week  . Drug use: No  . Sexual activity: Never  Lifestyle  . Physical activity:    Days per week: Not on file    Minutes per session: Not on file  . Stress: Not on file  Relationships  . Social connections:    Talks on phone: Not on file    Gets together: Not on file    Attends religious service: Not on file    Active member of club or organization: Not on file    Attends meetings of clubs or organizations: Not on file    Relationship status: Not on file  . Intimate partner violence:    Fear of current or ex partner: Not on file    Emotionally abused: Not on file    Physically abused: Not on file    Forced sexual activity: Not on file  Other Topics Concern  . Not on file  Social History Narrative   ** Merged History Encounter **       4 sons    FAMILY HISTORY: Family History  Problem Relation Age of Onset  . Cancer Mother        Brain Cancer  . Diabetes Father   . Heart disease Father        CAD  . Hypertension Father   . Stroke Father   . Diabetes Sister   . Stomach cancer Neg Hx   . Pancreatic cancer Neg Hx   . Colon cancer Neg Hx   . Esophageal cancer Neg Hx     ALLERGIES:  is allergic to no known allergies.  MEDICATIONS:  Current Facility-Administered Medications  Medication Dose Route Frequency Provider Last Rate Last Dose  . 0.45 % NaCl with KCl 20 mEq / L infusion    Intravenous Continuous Rayburn, Kelly A, PA-C 100 mL/hr at 05/24/18 1803    . alum & mag hydroxide-simeth (MAALOX/MYLANTA) 200-200-20 MG/5ML suspension 30 mL  30 mL Oral Q6H PRN Saverio Danker, PA-C      . diphenhydrAMINE (BENADRYL) 12.5 MG/5ML elixir 12.5 mg  12.5 mg Oral Q6H PRN Saverio Danker, PA-C       Or  . diphenhydrAMINE (BENADRYL) injection 12.5 mg  12.5 mg Intravenous Q6H PRN Saverio Danker, PA-C      . enoxaparin (  LOVENOX) injection 40 mg  40 mg Subcutaneous Q24H Saverio Danker, PA-C   40 mg at 05/24/18 1137  . famotidine (PEPCID) IVPB 20 mg premix  20 mg Intravenous Q12H Earnstine Regal, PA-C 100 mL/hr at 05/24/18 1138 20 mg at 05/24/18 1138  . gabapentin (NEURONTIN) capsule 100 mg  100 mg Oral BID Saverio Danker, PA-C   100 mg at 05/24/18 1137  . hydrALAZINE (APRESOLINE) injection 10 mg  10 mg Intravenous Q6H PRN Rayburn, Kelly A, PA-C   10 mg at 05/23/18 1652  . ipratropium-albuterol (DUONEB) 0.5-2.5 (3) MG/3ML nebulizer solution 3 mL  3 mL Nebulization Q4H PRN Cristal Ford, DO      . metoprolol tartrate (LOPRESSOR) injection 10 mg  10 mg Intravenous Q8H Rayburn, Kelly A, PA-C      . metoprolol tartrate (LOPRESSOR) injection 5 mg  5 mg Intravenous Once Earnstine Regal, PA-C      . morphine 2 MG/ML injection 1-4 mg  1-4 mg Intravenous Q3H PRN Saverio Danker, PA-C   4 mg at 05/24/18 0501  . ondansetron (ZOFRAN) tablet 4 mg  4 mg Oral Q6H PRN Saverio Danker, PA-C   4 mg at 05/21/18 0901   Or  . ondansetron (ZOFRAN) injection 4 mg  4 mg Intravenous Q6H PRN Saverio Danker, PA-C   4 mg at 05/22/18 2245  . oxyCODONE (Oxy IR/ROXICODONE) immediate release tablet 5-10 mg  5-10 mg Oral Q4H PRN Saverio Danker, PA-C   5 mg at 05/23/18 1222  . phenol (CHLORASEPTIC) mouth spray 1 spray  1 spray Mouth/Throat PRN Rayburn, Floyce Stakes, PA-C        REVIEW OF SYSTEMS:   Constitutional: Denies fevers, chills or abnormal night sweats Eyes: Denies blurriness of vision, double vision or watery  eyes Ears, nose, mouth, throat, and face: Denies mucositis or sore throat Respiratory: Denies cough, dyspnea or wheezes Cardiovascular: Denies palpitation, chest discomfort or lower extremity swelling Gastrointestinal:  Denies nausea, heartburn or change in bowel habits (+) constipation, abdominal pain, and bloating  Skin: Denies abnormal skin rashes Lymphatics: Denies new lymphadenopathy or easy bruising Neurological:Denies numbness, tingling or new weaknesses Behavioral/Psych: Mood is stable, no new changes  All other systems were reviewed with the patient and are negative.  PHYSICAL EXAMINATION: ECOG PERFORMANCE STATUS: 4 - Bedbound  Vitals:   05/24/18 1111 05/24/18 1500  BP: (!) 151/87 (!) 154/87  Pulse: (!) 106 (!) 101  Resp:  18  Temp:  98.8 F (37.1 C)  SpO2:  95%   Filed Weights   05/18/18 1822 05/19/18 0057 05/19/18 0957  Weight: 217 lb 9.5 oz (98.7 kg) 215 lb 14.4 oz (97.9 kg) 215 lb (97.5 kg)    GENERAL:alert, mild distressed  SKIN: skin color, texture, turgor are normal, no rashes or significant lesions EYES: normal, conjunctiva are pink and non-injected, sclera clear OROPHARYNX:no exudate, no erythema and lips, buccal mucosa, and tongue normal, he has NG tube for decompression  NECK: supple, thyroid normal size, non-tender, without nodularity LYMPH:  no palpable lymphadenopathy in the cervical, axillary or inguinal LUNGS: clear to auscultation and percussion with normal breathing effort HEART: regular rate & rhythm and no murmurs and no lower extremity edema ABDOMEN:abdomen soft, distended, non-tender and normal bowel sounds (+) abdominal tenderness, (+) midline incision covered  Musculoskeletal:no cyanosis of digits and no clubbing  PSYCH: alert & oriented x 3 with fluent speech NEURO: no focal motor/sensory deficits  LABORATORY DATA:  I have reviewed the data as listed Lab Results  Component Value Date  WBC 6.7 05/24/2018   HGB 12.1 (L) 05/24/2018   HCT  37.9 (L) 05/24/2018   MCV 90.7 05/24/2018   PLT 238 05/24/2018   Recent Labs    08/10/17 1203 01/17/18 1401 05/18/18 1851  05/22/18 0516 05/23/18 0456 05/24/18 0359  NA 138 139 138   < > 141 144 145  K 4.4 5.4* 3.9   < > 3.7 3.6 3.8  CL 99 96 97*   < > 111 111 114*  CO2 _0 < > 22 23 18*  GLUCOSE 79 84 183*   < > 109* 93 85  BUN _1 < > _2 CREATININE 0.94 1.31* 1.14   < > 0.92 0.83 0.85  CALCIUM 9.4 9.5 9.8   < > 8.3* 8.3* 8.5*  GFRNONAA 75 50* 57*   < > >60 >60 >60  GFRAA 86 57* >60   < > >60 >60 >60  PROT 6.5 6.4 7.2  --   --   --   --   ALBUMIN 4.4 4.2 4.2  --   --   --   --   AST _3 --   --   --   --   ALT _4 --   --   --   --   ALKPHOS 102 90 90  --   --   --   --   BILITOT 0.4 0.3 0.9  --   --   --   --   BILIDIR 0.18  --   --   --   --   --   --    < > = values in this interval not displayed.    Tumor Marker CEA Results for Vincent Mcclure, Vincent Mcclure (MRN [id number]) as of 05/24/2018 13:37  Ref. Range 05/19/2018 06:43  CEA Latest Ref Range: 0.0 - 4.7 ng/mL 3.1    Pathology  05/19/2018 Surgical Pathology Diagnosis Colon, segmental resection for tumor, right - INVASIVE COLORECTAL ADENOCARCINOMA, 5 CM. - CARCINOMA FOCALLY LESS THAN 0.1 CM FROM SEROSAL SURFACE. - MARGINS NOT INVOLVED. - METASTATIC CARCINOMA IN ONE OF EIGHT LYMPH NODES (1/9). - BENIGN APPENDIX. Microscopic Comment COLON AND RECTUM: Resection, Including Transanal Disk Excision of Rectal Neoplasms Procedure: Right colectomy Tumor Site: Proximal ascending colon Tumor Size: 5 cm Macroscopic Tumor Perforation: No Histologic Type: Colorectal adenocarcinoma. Histologic Grade: Moderately differentiated. Tumor Extension: Into pericolic connective tissue and focally less than 0.1 cm from the serosal surface. Margins: Free of tumor Treatment Effect: No Lymphovascular Invasion: Present Perineural Invasion: Yes Tumor Deposits: No Regional Lymph Nodes: Number of Lymph Nodes  Involved: 1 Number of Lymph Nodes Examined: 9 Pathologic Stage Classification (pTNM, AJCC 8th Edition): pT4a, pN1a, see comment. Ancillary Studies: MMR / MSI testing: Pending Representative tumor block: 69F, 1G, 1H and 1X Comments: The adenocarcinoma extends into the pericolonic connective tissue and focally is less than 0.1 cm from the serosal surface and is therefore staged as pT4a. Addendum: The adipose tissue attached to the segment of colon is cleared and one additional benign lymph node is isolated and the oncology table is thus updated. (JDP:kh 05-24-18) Specimen(s) Obtained: Colon, segmental resection for tumor, right Specimen Clinical Information Obstructed right colon, presumed cancer [rd] Gross Specimen: Received in formalin labeled right colon. Specimen integrity: Intact, with two stapled resection lines. Specimen length: There is 8.0 cm of terminal ileum and 30.5 cm of right colon. Mesorectal intactness: N/A. Tumor location: Within  the proximal ascending colon. Tumor size: There is a 5.0 x 4.0 cm tan-red, centrally ulcerated lesion identified with slightly rolled borders. Percent of bowel circumference involved: 100% Tumor distance to margins: Proximal: 16.5 cm. Distal: 11.2 cm. Radial (posterior ascending, posterior descending; lateral and posterior mid-rectum; and entire lower 1/3 rectum): The serosal surface underlying the lesion, areas of puckering involving the surrounding adipose tissue are identified and inked black. The lesion measures approximately 1.0 cm from the radial margin (inked blue). Macroscopic extent of tumor invasion: The tumor invades through the muscularis propria into the subserosal adipose tissue. Total presumed lymph nodes: Fourteen tan-pink possible lymph nodes are identified, ranging from 0.2 cm to 1.5 x 1.0 x 0.6 cm. Extramural satellite tumor nodules: Identified within the surrounding omentum there is a 1.0 cm in greatest dimension tan, firm  nodule identified. Mucosal polyp(s): 4.5 cm distal to the lesion there is a 0.7 cm in greatest dimension tan pink, sessile polyp identified. Additional findings: The mucosa proximal to the lesion is tan-red, hyperemic, with a small amount of tan loosely adherent softened material. The lumen is markedly dilated, and filled with an abundant amount of tan-Suddeth liquid and partially digested food. The mucosa distal to the lesion is tan pink with normal folding. The appendix is present, and measures 4.3 cm in length x 1.2 cm in diameter. The serosal surface is red-purple and slightly dusky. The mucosa is tan, and the wall measures 0.3 cm in thickness, and the lumen is mildly dilated with a small amount of tan-gray fecal material. Block summary: A,B= proximal resection margin. C,D= distal resection margin. E= mucosal polyp, bisected. F-I= lesion. J= mucosa proximal to lesion. K,L= appendix. M= possible extramural nodule, bisected. N=three possible lymph nodes. O= three possible lymph nodes. P= two possible lymph nodes. Q-U= one possible lymph node bisected, each. V,W= one possible lymph node, serially sectioned. X= tissue for molecular studies. (K:gt, 05/22/18)     RADIOGRAPHIC STUDIES: I have personally reviewed the radiological images as listed and agreed with the findings in the report.  05/18/2018 CT AP IMPRESSION: 1. There appears to be a nearly obstructing lesion in the region of the hepatic flexure of the colon which is highly concerning for primary colonic neoplasm. Slight haziness of the surrounding soft tissues may be indicative of early local invasion. This appears partially obstructive as evidenced by dilatation of more proximal aspects of the small bowel and colon. 2. Left adrenal lesion is stable compared to prior studies, previously characterized as an adenoma. 3. Aortic atherosclerosis, in addition to left main and 3 vessel coronary artery disease. Status post median  sternotomy for CABG. 4. Additional incidental findings, as above.  Aortic Atherosclerosis (ICD10-I70.0).   Dg Abd 1 View  Result Date: 05/22/2018 CLINICAL DATA:  Status post NG tube placement today. EXAM: ABDOMEN - 1 VIEW COMPARISON:  Plain films of the abdomen earlier today. FINDINGS: NG tube is in place with both the tip and side-port in the stomach. Diffuse gaseous distension of bowel is seen as on the prior exam. Surgical staples in the midline are noted. IMPRESSION: NG tube in good position. Mild diffuse gaseous distention of bowel likely due to ileus. Electronically Signed   By: Inge Rise M.D.   On: 05/22/2018 11:15   Dg Abd 1 View  Result Date: 05/22/2018 CLINICAL DATA:  Abdominal distention. Status post right colectomy for obstructing hepatic flexure lesion of the colon. EXAM: ABDOMEN - 1 VIEW COMPARISON:  Radiographs dated 05/19/2018 and CT scan dated 05/18/2018  FINDINGS: There is increased air throughout the bowel with an increased number of slightly distended small bowel loops. Given the patient's recent surgery, this probably represents an ileus. The colon is not distended. IMPRESSION: Increased air throughout the bowel with an increased number slightly dilated small bowel loops, most likely ileus. Electronically Signed   By: Lorriane Shire M.D.   On: 05/22/2018 08:30   Ct Abdomen Pelvis W Contrast  Result Date: 05/18/2018 CLINICAL DATA:  84 year old male with history of bloating and constipation for the past 2 days. Emesis today. EXAM: CT ABDOMEN AND PELVIS WITH CONTRAST TECHNIQUE: Multidetector CT imaging of the abdomen and pelvis was performed using the standard protocol following bolus administration of intravenous contrast. CONTRAST:  152m ISOVUE-300 IOPAMIDOL (ISOVUE-300) INJECTION 61% COMPARISON:  CT the abdomen and pelvis 05/22/2009. FINDINGS: Lower chest: Aortic atherosclerosis. Atherosclerotic calcifications noted in the left main, left anterior descending, left  circumflex and right coronary arteries. Thickening calcification of the aortic valve. Lipomatous hypertrophy of the interatrial septum (normal anatomical variant) incidentally noted. Status post median sternotomy for CABG. Hepatobiliary: No cystic or solid hepatic lesions. No intra or extrahepatic biliary ductal dilatation. Gallbladder is normal in appearance. Pancreas: No pancreatic mass. No pancreatic ductal dilatation. No pancreatic or peripancreatic fluid or inflammatory changes. Spleen: Calcified granulomas throughout the spleen. Adrenals/Urinary Tract: 2.4 cm left adrenal nodule (axial image 35 of series 2), stable compared to remote prior examinations, previously characterized as an adenoma. Right adrenal gland is unremarkable. Multiple subcentimeter low-attenuation lesions in the left kidney, too small to characterize, but statistically likely to represent tiny cysts. 1.4 cm simple cyst in the interpolar region of the left kidney. Vascular calcifications associated with both kidneys. Right kidney is otherwise unremarkable in appearance. No hydroureteronephrosis. Urinary bladder is normal in appearance. Stomach/Bowel: The appearance of the stomach is unremarkable. In the region of the hepatic flexure there is a mass-like area of mural thickening and luminal narrowing which measures approximately 4.7 x 4.5 x 4.7 cm (axial image 40 of series 2 and sagittal image 110 of series 6), highly concerning for primary colonic neoplasm. Slight haziness in the surrounding soft tissues is noted. Proximal to this there is dilatation of the cecum and ascending colon with liquid stool. More distal aspect of the colon and rectum are relatively decompressed. Multiple prominent borderline dilated loops of small bowel measuring up to 3.8 cm. Normal appendix. Vascular/Lymphatic: Aortic atherosclerosis, without evidence of aneurysm or dissection in the abdominal or pelvic vasculature. No lymphadenopathy noted in the abdomen or  pelvis. Reproductive: Prostate gland and seminal vesicles are unremarkable in appearance. Other: No significant volume of ascites.  No pneumoperitoneum. Musculoskeletal: There are no aggressive appearing lytic or blastic lesions noted in the visualized portions of the skeleton. Median sternotomy wires. IMPRESSION: 1. There appears to be a nearly obstructing lesion in the region of the hepatic flexure of the colon which is highly concerning for primary colonic neoplasm. Slight haziness of the surrounding soft tissues may be indicative of early local invasion. This appears partially obstructive as evidenced by dilatation of more proximal aspects of the small bowel and colon. 2. Left adrenal lesion is stable compared to prior studies, previously characterized as an adenoma. 3. Aortic atherosclerosis, in addition to left main and 3 vessel coronary artery disease. Status post median sternotomy for CABG. 4. Additional incidental findings, as above. Aortic Atherosclerosis (ICD10-I70.0). Electronically Signed   By: DVinnie LangtonM.D.   On: 05/18/2018 22:07   [facility]  Result  Date: 05/22/2018 CLINICAL DATA:  Status post NG tube placement today. Shortness of breath and abdominal pain. EXAM: PORTABLE CHEST 1 VIEW COMPARISON:  Plain films chest and abdomen 05/18/2018. FINDINGS: NG tube courses into the stomach and below the inferior margin of the film. Lungs are clear. Heart size is normal. Aortic atherosclerosis is noted. The patient is status post CABG. No pneumothorax or pleural effusion. No acute bony abnormality. IMPRESSION: NG tube courses into the stomach and below the inferior margin of the film. No acute disease. Atherosclerosis. Electronically Signed   By: Inge Rise M.D.   On: 05/22/2018 11:14   Dg Abdomen Acute W/chest  Result Date: 05/18/2018 CLINICAL DATA:  84 year old male with history of constipation, bloating and nausea for the past 4 days. EXAM: DG ABDOMEN ACUTE W/ 1V CHEST  COMPARISON:  Chest x-ray 09/14/2017. FINDINGS: Lung volumes are normal. No consolidative airspace disease. No pleural effusions. No pneumothorax. No pulmonary nodule or mass noted. Pulmonary vasculature and the cardiomediastinal silhouette are within normal limits. Low aortic atherosclerosis. Status post median sternotomy. Multiple dilated loops of gas-filled small bowel are noted measuring up to 5.7 cm in diameter with several air-fluid levels noted on the upright projection. Gas and stool is noted throughout the colon extending to the level of the distal rectum. No pneumoperitoneum. IMPRESSION: 1. Nonspecific bowel gas pattern, as above. Findings could either reflect early or partial small bowel obstruction, or could be indicative of ileus. 2. No pneumoperitoneum. 3. No radiographic evidence of acute cardiopulmonary disease. 4. Aortic atherosclerosis. Electronically Signed   By: Vinnie Langton M.D.   On: 05/18/2018 20:19   Dg Abd Portable 1v  Result Date: 05/24/2018 CLINICAL DATA:  Abdominal distention, postop ileus EXAM: PORTABLE ABDOMEN - 1 VIEW COMPARISON:  05/23/2018 FINDINGS: NG tube has likely passed into the proximal descending duodenum. Continued diffuse gaseous distention of bowel, likely ileus. No visible free air organomegaly. IMPRESSION: Stable diffuse significant gaseous distention of bowel, likely ileus. Electronically Signed   By: Rolm Baptise M.D.   On: 05/24/2018 09:30   Dg Abd Portable 1v  Result Date: 05/23/2018 CLINICAL DATA:  Postoperative ileus EXAM: PORTABLE ABDOMEN - 1 VIEW COMPARISON:  05/22/2018 FINDINGS: NG tube tip remains in the stomach. Diffuse gaseous distention of bowel again noted, likely ileus, unchanged. IMPRESSION: No significant change since prior study. Electronically Signed   By: Rolm Baptise M.D.   On: 05/23/2018 07:15   Dg Abd Portable 1v  Result Date: 05/19/2018 CLINICAL DATA:  84 year old male with a history surgery. Operative plain film for incomplete  surgical instrument count EXAM: PORTABLE ABDOMEN - 1 VIEW COMPARISON:  05/18/2018 FINDINGS: Gas within small bowel, colon.  Borderline dilated small bowel. Surgical changes along the midline abdomen of laparotomy. Surgical changes of the right upper quadrant. Surgical changes of median sternotomy and CABG. No unexpected radiopaque foreign body. Calcifications of the vasculature. IMPRESSION: Plain film is negative for radiopaque foreign body. Surgical changes of the abdomen, as above. These results were called by telephone at the time of interpretation on 05/19/2018 at 12:48 pm to circulating team in OR 2, who verbally acknowledged these results. Electronically Signed   By: Corrie Mckusick D.O.   On: 05/19/2018 12:48    ASSESSMENT & PLAN:  HRISHIKESH HOEG is a 84 y.o. male with past medical history of CABG and back surgery, presented with acute abdominal pain and nausea, was found to have colonic bowel obstruction.  1. Adenocarcinoma of right colon, invasive adenocarcinoma, stage IIIB (pT4a, pN1, cM0),  MSS -Pt initially presented to the ER with abdominal pain, distention and constipation. A CT AP done revealed a 5 CM mass and bowel obstruction, which was biopsied and showed invasive adenocarcinoma.  The bowel obstruction, he underwent urgent right hemicolectomy.  I reviewed his surgical pathology findings with patient and his wife, unfortunately he had a T4a lesion and metastasis to one out of nine LNs. -We discussed the high risk of cancer recurrence after his completed surgical resection, due to the T4 lesion and positive node. -We discussed in the standard adjuvant chemotherapy for high risk colon cancer, especially stage III.  However due to his advanced age, limited performance status at the baseline, he is not a candidate for intravenous chemo.  He may not even be a good candidate for single agent oral Xeloda, if he does not recover well. -Patient states he is not interested in chemotherapy, he is  concerned about side effects from chemo. -Discussed colon cancer surveillance with routine lab, follow-up and scan.  I will see him back in a month after his discharge. -We will get a baseline CT chest to complete staging.  His preop CEA was normal. -post-op management per surgical team   2. Anemia, like due to chronic blood loss -His Hg on 05/24/2018 was 12.1 -will monitor   3. CAD S/P CABG -continue f/u with PCP    Recommendations: -I will set up f/u in clinic in 4 weeks   All questions were answered. The patient knows to call the clinic with any problems, questions or concerns.  Dierdre Searles Dweik am acting as scribe for Dr. Truitt Merle.  I have reviewed the above documentation for accuracy and completeness, and I agree with the above.      Truitt Merle, MD 05/24/2018 6:13 PM

## 2018-05-24 NOTE — Progress Notes (Signed)
Physical Therapy Treatment Patient Details Name: Vincent Mcclure MRN: [id number] DOB: 1932-08-09 Today's Date: 05/24/2018    History of Present Illness Vincent Mcclure is a 84 y.o. male with medical history significant of CAD s/p CABG in 2018, PAF, GIB;  Patient presents to ED with onset of Abd pain, distention, N/V; s/p exp lap with colectomy  on 05/19/18     PT Comments    Clamped NG tube.  Assisted OOB to amb however distance limited by increased ABD pain and increased BP.  See gait details below.   Follow Up Recommendations  No PT follow up per eval however with increased length of stay may need services TBD      Equipment Recommendations       Recommendations for Other Services       Precautions / Restrictions Precautions Precautions: Fall Precaution Comments: NPO Restrictions Weight Bearing Restrictions: No    Mobility  Bed Mobility Overal bed mobility: Needs Assistance Bed Mobility: Supine to Sit     Supine to sit: Mod assist;Min assist     General bed mobility comments: assist upper body due to distended ABD and girth  Transfers Overall transfer level: Needs assistance Equipment used: Rolling walker (2 wheeled) Transfers: Sit to/from [facility] Sit to Stand: Min guard;Min assist Stand pivot transfers: Min guard;Min assist       General transfer comment: 25% VC's on safety with turns and assist to control stand to sit as pt tends to "plop"   Ambulation/Gait Ambulation/Gait assistance: Min guard Gait Distance (Feet): 22 Feet Assistive device: Rolling walker (2 wheeled) Gait Pattern/deviations: Step-through pattern;Trunk flexed Gait velocity: decreased    General Gait Details: limited distance due to max c/o weakness and mild dizziness.  Assisted to recliner BP 181/92(115), RA 99% and HR 84 with 7/10 ABD pain.  Reported to Insurance underwriter Rankin (Stroke Patients Only)       Balance                                             Cognition Arousal/Alertness: Awake/alert Behavior During Therapy: WFL for tasks assessed/performed Overall Cognitive Status: Within Functional Limits for tasks assessed                                 General Comments: sweet      Exercises      General Comments        Pertinent Vitals/Pain Pain Assessment: Faces Faces Pain Scale: Hurts a little bit Pain Location: ABD Pain Descriptors / Indicators: Tightness;Discomfort Pain Intervention(s): Monitored during session    Home Living                      Prior Function            PT Goals (current goals can now be found in the care plan section) Progress towards PT goals: Progressing toward goals    Frequency    Min 3X/week      PT Plan      Co-evaluation              AM-PAC PT "6 Clicks" Daily Activity  Outcome Measure  Difficulty turning over in  bed (including adjusting bedclothes, sheets and blankets)?: A Lot Difficulty moving from lying on back to sitting on the side of the bed? : A Lot Difficulty sitting down on and standing up from a chair with arms (e.g., wheelchair, bedside commode, etc,.)?: A Lot Help needed moving to and from a bed to chair (including a wheelchair)?: A Lot Help needed walking in hospital room?: A Lot Help needed climbing 3-5 steps with a railing? : Total 6 Click Score: 11    End of Session Equipment Utilized During Treatment: Gait belt Activity Tolerance: Patient limited by pain;Patient limited by fatigue Patient left: in chair;with chair alarm set;with call bell/phone within reach Nurse Communication: Other (comment)(high BP high pain) PT Visit Diagnosis: Unsteadiness on feet (R26.81)     Time: 1030-1056 PT Time Calculation (min) (ACUTE ONLY): 26 min  Charges:  $Gait Training: 8-22 mins $Therapeutic Activity: 8-22 mins                     Rica Koyanagi  PTA [facility]  Acute  Rehab Pager       [phone]

## 2018-05-25 ENCOUNTER — Inpatient Hospital Stay (HOSPITAL_COMMUNITY): Payer: Medicare HMO

## 2018-05-25 ENCOUNTER — Telehealth: Payer: Self-pay | Admitting: Hematology

## 2018-05-25 LAB — CBC
HCT: 36.6 % — ABNORMAL LOW (ref 39.0–52.0)
HEMOGLOBIN: 11.9 g/dL — AB (ref 13.0–17.0)
MCH: 29.6 pg (ref 26.0–34.0)
MCHC: 32.5 g/dL (ref 30.0–36.0)
MCV: 91 fL (ref 78.0–100.0)
Platelets: 252 10*3/uL (ref 150–400)
RBC: 4.02 MIL/uL — ABNORMAL LOW (ref 4.22–5.81)
RDW: 14.6 % (ref 11.5–15.5)
WBC: 7.5 10*3/uL (ref 4.0–10.5)

## 2018-05-25 LAB — BASIC METABOLIC PANEL
ANION GAP: 11 (ref 5–15)
BUN: 16 mg/dL (ref 8–23)
CALCIUM: 8.8 mg/dL — AB (ref 8.9–10.3)
CHLORIDE: 114 mmol/L — AB (ref 98–111)
CO2: 20 mmol/L — AB (ref 22–32)
CREATININE: 0.83 mg/dL (ref 0.61–1.24)
GFR calc non Af Amer: >60 mL/min (ref >60)
GLUCOSE: 91 mg/dL (ref 70–99)
Potassium: 4.2 mmol/L (ref 3.5–5.1)
Sodium: 145 mmol/L (ref 135–145)

## 2018-05-25 MED ORDER — METOCLOPRAMIDE HCL 5 MG/ML IJ SOLN
5.0000 mg | Freq: Four times a day (QID) | INTRAMUSCULAR | Status: AC
  Administered 2018-05-25 – 2018-05-26 (×4): 5 mg via INTRAVENOUS
  Filled 2018-05-25 (×4): qty 2

## 2018-05-25 MED ORDER — FUROSEMIDE 10 MG/ML IJ SOLN
40.0000 mg | Freq: Once | INTRAMUSCULAR | Status: AC
  Administered 2018-05-25: 40 mg via INTRAVENOUS
  Filled 2018-05-25: qty 4

## 2018-05-25 MED ORDER — BISACODYL 10 MG RE SUPP
10.0000 mg | Freq: Once | RECTAL | Status: AC
  Administered 2018-05-25: 10 mg via RECTAL
  Filled 2018-05-25: qty 1

## 2018-05-25 NOTE — Telephone Encounter (Signed)
Pt has been scheduled for a hospital follow up with Dr. [name] on 9/12 at 1045am. Arna Snipe, GI RN Navigator, will give the pt the appt date and time.

## 2018-05-25 NOTE — Progress Notes (Signed)
[facility] Progress Note  6 Days Post-Op  Subjective: CC: ileus Patient felt like he could almost pass gas once yesterday but then was unable to. Feels slightly more distended this AM. Feels like phlebitis of RUE is improved, LUE phlebitis still red and swollen. Seen by Dr. [name] with oncology and does not desire chemotx. Pt excited that his son is in town from [place] today and will likely be visiting him.   Objective: Vital signs in last 24 hours: Temp:  [97.5 F (36.4 C)-98.8 F (37.1 C)] 97.9 F (36.6 C) (08/15 0529) Pulse Rate:  [101-108] 106 (08/15 0529) Resp:  [16-18] 16 (08/15 0529) BP: (150-180)/(87-95) 150/95 (08/15 0529) SpO2:  [94 %-96 %] 96 % (08/15 0529) Last BM Date: 05/16/18  Intake/Output from previous day: 08/14 0701 - 08/15 0700 In: 230  Out: 1950 [Urine:550; Emesis/NG output:1400] Intake/Output this shift: No intake/output data recorded.  PE: Gen: Alert, NAD,  Card: Regular rate and rhythm Pulm: Normal effort, crackles in bases bilaterally Abd: Soft,appropriatelytender, distended, bowel soundshypoactive but less high-pitched today, no HSM, incision C/D/Iwith staples present Skin: warm and dry, no rashes  Psych: A&Ox3  Lab Results:  Recent Labs    05/24/18 0359 05/25/18 0427  WBC 6.7 7.5  HGB 12.1* 11.9*  HCT 37.9* 36.6*  PLT 238 252   BMET Recent Labs    05/24/18 1835 05/25/18 0427  NA 145 145  K 4.1 4.2  CL 112* 114*  CO2 19* 20*  GLUCOSE 84 91  BUN 17 16  CREATININE 0.88 0.83  CALCIUM 8.7* 8.8*   PT/INR No results for input(s): LABPROT, INR in the last 72 hours. CMP     Component Value Date/Time   NA 145 05/25/2018 0427   NA 139 01/17/2018 1401   K 4.2 05/25/2018 0427   CL 114 (H) 05/25/2018 0427   CO2 20 (L) 05/25/2018 0427   GLUCOSE 91 05/25/2018 0427   BUN 16 05/25/2018 0427   BUN 13 01/17/2018 1401   CREATININE 0.83 05/25/2018 0427   CALCIUM 8.8 (L) 05/25/2018 0427   PROT 7.2 05/18/2018 1851    PROT 6.4 01/17/2018 1401   ALBUMIN 4.2 05/18/2018 1851   ALBUMIN 4.2 01/17/2018 1401   AST 28 05/18/2018 1851   ALT 11 05/18/2018 1851   ALKPHOS 90 05/18/2018 1851   BILITOT 0.9 05/18/2018 1851   BILITOT 0.3 01/17/2018 1401   GFRNONAA >60 05/25/2018 0427   GFRAA >60 05/25/2018 0427   Lipase     Component Value Date/Time   LIPASE 38 05/18/2018 1851       Studies/Results: Dg Abd Portable 1v  Result Date: 05/24/2018 CLINICAL DATA:  Abdominal distention, postop ileus EXAM: PORTABLE ABDOMEN - 1 VIEW COMPARISON:  05/23/2018 FINDINGS: NG tube has likely passed into the proximal descending duodenum. Continued diffuse gaseous distention of bowel, likely ileus. No visible free air organomegaly. IMPRESSION: Stable diffuse significant gaseous distention of bowel, likely ileus. Electronically Signed   By: Rolm Baptise M.D.   On: 05/24/2018 09:30    Anti-infectives: Anti-infectives (From admission, onward)   Start     Dose/Rate Route Frequency Ordered Stop   05/19/18 2200  cefoTEtan (CEFOTAN) 2 g in sodium chloride 0.9 % 100 mL IVPB     2 g 200 mL/hr over 30 Minutes Intravenous Every 12 hours 05/19/18 1623 05/19/18 2241   05/19/18 0900  cefoTEtan (CEFOTAN) 2 g in sodium chloride 0.9 % 100 mL IVPB     2 g 200 mL/hr over 30  Minutes Intravenous On call to O.R. 05/19/18 5009 05/19/18 1103   05/19/18 0900  cefoTEtan (CEFOTAN) 2 g in sodium chloride 0.9 % 100 mL IVPB  Status:  Discontinued     2 g 200 mL/hr over 30 Minutes Intravenous On call to O.R. 05/19/18 3818 05/19/18 0856       Assessment/Plan HTN - scheduled IV metoprolol, prn Hydralazine  HLD CAD - s/p CABG 2018, stable, IV lasix today  Paroxysmal atrial fibrillation - on scheduled IV metoprolol, no anticoagulation due to hx of GI bleeding Acute on Chronic CKD stage III - Cr 0.83, resolved GERD  Adenocarcinoma of the colon ([id number]) S/p exploratory laparotomy with right colectomy and primary anastomosis 05/19/18 Dr.  Dalbert Batman Post-operative ileus - POD#6 -KUB this AM with dilated small bowel -continueNGT for decompression, may clamp for pt to walk - NGT output bilious - try low dose scheduled reglan today - monitor for EPS - try dulcolax suppository today  - mobilize!!!! - patient may shower with incision uncovered Hypokalemia - K 4.2, resolved Phlebitis - ice/heat to affected areas prn   FEN: NPO, IVF; NGT to LIWS VTE: SCDs, lovenox ID: [email or web address] periop  Try low dose reglan and dulcolax suppository. Mobilize more today. Continue to monitor electrolytes. Repeat KUB in the AM.   LOS: 7 days    Brigid Re , [facility] Surgery 05/25/2018, 8:50 AM Pager: [phone] Consults: [phone] Mon-Fri 7:00 am-4:30 pm Sat-Sun 7:00 am-11:30 am

## 2018-05-25 NOTE — Progress Notes (Signed)
  Oncology Nurse Navigator Documentation  Navigator Location: [facility] (05/25/18 1200) Referral date to RadOnc/MedOnc: 05/24/18 (05/25/18 1200) )Navigator Encounter Type: Other (05/25/18 1200)  Met with Vincent Mcclure, wife and son. Explained role of nurse navigator. Provided D/T/L of hospital F/U appointment with Dr. [name] on 9/12. Provided patient with my contact information for questions or concerns. No barriers to care identified at present time.  Will meet with patient at his f/u apt.  Abnormal Finding Date: 05/18/18(Inpatient Unit 6E) (05/25/18 1200) Confirmed Diagnosis Date: 05/19/18 (05/25/18 1200) Surgery Date: 05/19/18 (05/25/18 1200)           Treatment Initiated Date: 05/19/18 (05/25/18 1200) Patient Visit Type: Inpatient (05/25/18 1200) Treatment Phase: Pre-Tx/Tx Discussion (05/25/18 1200) Barriers/Navigation Needs: No barriers at this time (05/25/18 1200)   Interventions: Coordination of Care;Psycho-social support(Hospital F/U apt on 9/12 @ 10:45 Dr. [name] arrive 10:15 AM) (05/25/18 1200)            Acuity: Level 2 (05/25/18 1200)         Time Spent with Patient: 45 (05/25/18 1200)

## 2018-05-25 NOTE — Progress Notes (Signed)
Physical Therapy Treatment Patient Details Name: Vincent Mcclure MRN: [id number] DOB: [birth year] Today's Date: 05/25/2018    History of Present Illness Vincent Mcclure is a 84 y.o. male with medical history significant of CAD s/p CABG in 2018, PAF, GIB;  Patient presents to ED with onset of Abd pain, distention, N/V; s/p exp lap with colectomy  on 05/19/18     PT Comments    Pt OOB in recliner.  Assisted with amb . General Gait Details: tolerated an increased distance with Mod c/o fatigue on exersion    required one sitting rest break    feels weak     Follow Up Recommendations  No PT follow up(TBD closer to D/C extended length od stay)     Equipment Recommendations  None recommended by PT    Recommendations for Other Services       Precautions / Restrictions Precautions Precautions: Fall Precaution Comments: NPO/NG tube Restrictions Weight Bearing Restrictions: No    Mobility  Bed Mobility               General bed mobility comments: OOB in recliner  Transfers Overall transfer level: Needs assistance Equipment used: Rolling walker (2 wheeled) Transfers: Sit to/from [facility] Sit to Stand: Supervision;Min guard Stand pivot transfers: Supervision;Min guard       General transfer comment: 25% VC's on safety with turns and assist to control stand to sit as pt tends to "plop"   Ambulation/Gait Ambulation/Gait assistance: Min guard Gait Distance (Feet): 80 Feet(40 feet x 2 one sitting rest break) Assistive device: Rolling walker (2 wheeled) Gait Pattern/deviations: Step-through pattern;Trunk flexed     General Gait Details: tolerated an increased distance with Mod c/o fatigue on exersion    required one sitting rest break    feels weak      Stairs             Wheelchair Mobility    Modified Rankin (Stroke Patients Only)       Balance                                            Cognition Arousal/Alertness:  Awake/alert Behavior During Therapy: WFL for tasks assessed/performed Overall Cognitive Status: Within Functional Limits for tasks assessed                                        Exercises      General Comments        Pertinent Vitals/Pain Pain Assessment: Faces Pain Location: ABD Pain Descriptors / Indicators: Tightness;Discomfort Pain Intervention(s): Monitored during session    Home Living                      Prior Function            PT Goals (current goals can now be found in the care plan section) Progress towards PT goals: Progressing toward goals    Frequency    Min 3X/week      PT Plan      Co-evaluation              AM-PAC PT "6 Clicks" Daily Activity  Outcome Measure  Difficulty turning over in bed (including adjusting bedclothes, sheets and blankets)?: A Lot Difficulty moving from lying  on back to sitting on the side of the bed? : A Lot Difficulty sitting down on and standing up from a chair with arms (e.g., wheelchair, bedside commode, etc,.)?: A Lot Help needed moving to and from a bed to chair (including a wheelchair)?: A Lot Help needed walking in hospital room?: A Lot Help needed climbing 3-5 steps with a railing? : A Lot 6 Click Score: 12    End of Session Equipment Utilized During Treatment: Gait belt Activity Tolerance: Patient limited by fatigue Patient left: in chair;with chair alarm set;with call bell/phone within reach   PT Visit Diagnosis: Unsteadiness on feet (R26.81)     Time: 1430-1455 PT Time Calculation (min) (ACUTE ONLY): 25 min  Charges:  $Gait Training: 8-22 mins $Therapeutic Activity: 8-22 mins                     Rica Koyanagi  PTA [facility]  Acute  Rehab Pager      [phone]

## 2018-05-26 ENCOUNTER — Inpatient Hospital Stay (HOSPITAL_COMMUNITY): Payer: Medicare HMO

## 2018-05-26 LAB — BASIC METABOLIC PANEL
ANION GAP: 14 (ref 5–15)
BUN: 16 mg/dL (ref 8–23)
CO2: 18 mmol/L — ABNORMAL LOW (ref 22–32)
Calcium: 9 mg/dL (ref 8.9–10.3)
Chloride: 114 mmol/L — ABNORMAL HIGH (ref 98–111)
Creatinine, Ser: 1.02 mg/dL (ref 0.61–1.24)
GFR calc non Af Amer: >60 mL/min (ref >60)
Glucose, Bld: 102 mg/dL — ABNORMAL HIGH (ref 70–99)
POTASSIUM: 3.7 mmol/L (ref 3.5–5.1)
SODIUM: 146 mmol/L — AB (ref 135–145)

## 2018-05-26 LAB — CBC
HEMATOCRIT: 37.6 % — AB (ref 39.0–52.0)
HEMOGLOBIN: 12.2 g/dL — AB (ref 13.0–17.0)
MCH: 29.3 pg (ref 26.0–34.0)
MCHC: 32.4 g/dL (ref 30.0–36.0)
MCV: 90.4 fL (ref 78.0–100.0)
Platelets: 273 10*3/uL (ref 150–400)
RBC: 4.16 MIL/uL — AB (ref 4.22–5.81)
RDW: 15 % (ref 11.5–15.5)
WBC: 7.9 10*3/uL (ref 4.0–10.5)

## 2018-05-26 MED ORDER — FUROSEMIDE 40 MG PO TABS
40.0000 mg | ORAL_TABLET | Freq: Every day | ORAL | Status: DC
  Administered 2018-05-26 – 2018-05-30 (×5): 40 mg via ORAL
  Filled 2018-05-26 (×5): qty 1

## 2018-05-26 MED ORDER — METOPROLOL SUCCINATE ER 50 MG PO TB24
50.0000 mg | ORAL_TABLET | Freq: Every day | ORAL | Status: DC
  Administered 2018-05-26 – 2018-05-30 (×5): 50 mg via ORAL
  Filled 2018-05-26 (×5): qty 1

## 2018-05-26 MED ORDER — PRAVASTATIN SODIUM 20 MG PO TABS
40.0000 mg | ORAL_TABLET | Freq: Every day | ORAL | Status: DC
  Administered 2018-05-26 – 2018-05-29 (×4): 40 mg via ORAL
  Filled 2018-05-26 (×4): qty 2

## 2018-05-26 NOTE — Progress Notes (Addendum)
[facility] Progress Note  7 Days Post-Op  Subjective: CC:  Urinated a lot yesterday 2/2 lasix. Reports increasing flatus yesterday evening and a large volume BM around 4:30 AM. States he got OOB to chair twice yesterday. Pulling >1200 cc on IS.  Objective: Vital signs in last 24 hours: Temp:  [97.9 F (36.6 C)-98.6 F (37 C)] 97.9 F (36.6 C) (08/16 0552) Pulse Rate:  [54-109] 102 (08/16 0552) Resp:  [16-18] 17 (08/16 0552) BP: (140-152)/(76-86) 146/78 (08/16 0552) SpO2:  [96 %] 96 % (08/16 0552) Last BM Date: 05/16/18  Intake/Output from previous day: 08/15 0701 - 08/16 0700 In: 2500 [I.V.:2400; IV Piggyback:100] Out: 1625 [Urine:1625] Intake/Output this shift: Total I/O In: -  Out: 175 [Urine:175]  PE: Gen:  Alert, NAD, pleasant and cooperative  Card:  Regular rate and rhythm, pedal pulses 2+ BL  Pulm:  Normal effort, clear to auscultation bilaterally Abd: Soft, appropriately tender, +BS, midline wound covered with dry dressing  NG cannister with ~200 cc bilious output in cannister Skin: warm and dry, no rashes  Psych: A&Ox3   Lab Results:  Recent Labs    05/25/18 0427 05/26/18 0503  WBC 7.5 7.9  HGB 11.9* 12.2*  HCT 36.6* 37.6*  PLT 252 273   BMET Recent Labs    05/25/18 0427 05/26/18 0503  NA 145 146*  K 4.2 3.7  CL 114* 114*  CO2 20* 18*  GLUCOSE 91 102*  BUN 16 16  CREATININE 0.83 1.02  CALCIUM 8.8* 9.0   PT/INR No results for input(s): LABPROT, INR in the last 72 hours. CMP     Component Value Date/Time   NA 146 (H) 05/26/2018 0503   NA 139 01/17/2018 1401   K 3.7 05/26/2018 0503   CL 114 (H) 05/26/2018 0503   CO2 18 (L) 05/26/2018 0503   GLUCOSE 102 (H) 05/26/2018 0503   BUN 16 05/26/2018 0503   BUN 13 01/17/2018 1401   CREATININE 1.02 05/26/2018 0503   CALCIUM 9.0 05/26/2018 0503   PROT 7.2 05/18/2018 1851   PROT 6.4 01/17/2018 1401   ALBUMIN 4.2 05/18/2018 1851   ALBUMIN 4.2 01/17/2018 1401   AST 28 05/18/2018  1851   ALT 11 05/18/2018 1851   ALKPHOS 90 05/18/2018 1851   BILITOT 0.9 05/18/2018 1851   BILITOT 0.3 01/17/2018 1401   GFRNONAA >60 05/26/2018 0503   GFRAA >60 05/26/2018 0503   Lipase     Component Value Date/Time   LIPASE 38 05/18/2018 1851       Studies/Results: Dg Abd Portable 1v  Result Date: 05/25/2018 CLINICAL DATA:  Postoperative ileus. EXAM: PORTABLE ABDOMEN - 1 VIEW COMPARISON:  Radiographs of January 22, 2018. FINDINGS: Nasogastric tube tip is seen in expected position of distal stomach. Continued presence of small bowel dilatation is noted without significant colonic dilatation. IMPRESSION: Continued small bowel dilatation is noted which may represent postoperative ileus, but follow-up radiographs are recommended to rule out distal small bowel obstruction. Electronically Signed   By: Marijo Conception, M.D.   On: 05/25/2018 09:28    Anti-infectives: Anti-infectives (From admission, onward)   Start     Dose/Rate Route Frequency Ordered Stop   05/19/18 2200  cefoTEtan (CEFOTAN) 2 g in sodium chloride 0.9 % 100 mL IVPB     2 g 200 mL/hr over 30 Minutes Intravenous Every 12 hours 05/19/18 1623 05/19/18 2241   05/19/18 0900  cefoTEtan (CEFOTAN) 2 g in sodium chloride 0.9 % 100 mL IVPB  2 g 200 mL/hr over 30 Minutes Intravenous On call to O.R. 05/19/18 2706 05/19/18 1103   05/19/18 0900  cefoTEtan (CEFOTAN) 2 g in sodium chloride 0.9 % 100 mL IVPB  Status:  Discontinued     2 g 200 mL/hr over 30 Minutes Intravenous On call to O.R. 05/19/18 2376 05/19/18 0856     Assessment/Plan HTN-  start PO metoprolol at home dose, prn Hydralazine HLD CAD - s/p CABG 2018, stable, daily lasix PO Paroxysmal atrial fibrillation -PO metoprolol, no anticoagulation due to hx of GI bleeding Acute on Chronic CKD stage III - Cr 0.83, resolved GERD  Adenocarcinoma of the colon ([id number]) S/p exploratory laparotomy with right colectomy and primary anastomosis 05/19/18 Dr.  Dalbert Batman Post-operative ileus - POD#7 -KUB this AM with dilated small bowel - bowel function returned, having a lot of flatus and had a large BM early this morning (8/16) - start NG tube clamping trials. - continue scheduled reglan today - monitor for EPS; will plan D/C in 24-48 h  - mobilize!!!! - patient may shower with incision uncovered  Hypokalemia - K 3.7, resolved Phlebitis - ice/heat to affected areas prn   FEN: NPO, IVF; clamp NGT, possible PM D/C; may have sips with meds. VTE: SCDs, lovenox ID: [email or web address] periop   LOS: 8 days    Obie Dredge, [facility] Pager: [phone]

## 2018-05-26 NOTE — Care Management Important Message (Signed)
Important Message  Patient Details  Name: Vincent Mcclure MRN: [id number] Date of Birth: Jan 18, 1932   Medicare Important Message Given:  Yes    Kerin Salen 05/26/2018, 10:50 AMImportant Message  Patient Details  Name: Vincent Mcclure MRN: [id number] Date of Birth: 1932-09-22   Medicare Important Message Given:  Yes    Kerin Salen 05/26/2018, 10:49 AM

## 2018-05-27 MED ORDER — ACETAMINOPHEN 500 MG PO TABS
500.0000 mg | ORAL_TABLET | Freq: Four times a day (QID) | ORAL | Status: DC | PRN
  Administered 2018-05-27: 500 mg via ORAL
  Filled 2018-05-27: qty 1

## 2018-05-27 NOTE — Progress Notes (Signed)
Progress Note: General Surgery Service   Assessment/Plan: Patient Active Problem List   Diagnosis Date Noted  . Cancer of right colon ([place]) 05/24/2018  . Colonic mass 05/19/2018  . Colonic obstruction ([place]) 05/18/2018  . CAD (coronary artery disease), native coronary artery 03/14/2017  . PAF (paroxysmal atrial fibrillation) ([place])   . 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 s/p NSTEMI (non-ST elevated myocardial infarction); with subsequent urgent CABG  5/18 ([place]) 02/12/2017  . Chest pain 02/11/2017  . Overweight (BMI 25.0-29.9) 09/29/2016  . Alcohol use/ h/o heavy abuse 08/11/2016  . Left shoulder pain 01/24/2015  . GI bleed 11/14/2014  . h/o Anemia due to blood loss, acute 11/14/2014  . Eye pain   . Acute esophagitis   . Acute gastric ulcer   . Duodenal ulcer disease   . Upper GI bleed 11/13/2014  . Lumbar disc disease 01/30/2014  . Hearing loss 03/23/2013  . Screening for other and unspecified cardiovascular conditions 03/23/2013  . PAD (peripheral artery disease) ([place]) 10/14/2011  . Chronic low back pain 10/14/2011  . Glucose intolerance (impaired glucose tolerance) 10/12/2011  . Preventative health care 10/12/2011  . LEG CRAMPS 09/02/2010  . PERIPHERAL EDEMA 09/02/2010  . VARICOSE VEINS, LOWER EXTREMITIES 06/09/2009  . HLD (hyperlipidemia) 03/05/2008  . Anemia, iron deficiency 03/05/2008  . Hereditary and idiopathic peripheral neuropathy 03/05/2008  . HTN (hypertension) 03/05/2008  . GERD 03/05/2008  . IBS 03/05/2008  . Blood in stool 03/05/2008  . COLONIC POLYPS, HX OF 03/05/2008   s/p Procedure(s): EXPLORATORY LAPAROTOMY RIGHT COLECTOMY 05/19/2018 -advance diet -monitor wound closely -ambulate    LOS: 9 days  Chief Complaint/Subjective: Tolerating liquids, +BM  Objective: Vital signs in last 24 hours: Temp:  [98 F (36.7 C)-98.2 F (36.8 C)] 98 F (36.7 C) (08/17 0533) Pulse Rate:  [93-117] 112 (08/17 0533) Resp:  [16-18] 17 (08/17 0533) BP: (138-168)/(75-97)  138/75 (08/17 0533) SpO2:  [96 %-98 %] 96 % (08/17 0533) Last BM Date: 05/26/18  Intake/Output from previous day: 08/16 0701 - 08/17 0700 In: 2450 [I.V.:2400; IV Piggyback:50] Out: 1000 [Urine:1000] Intake/Output this shift: No intake/output data recorded.  Lungs: CTAB  Cardiovascular: tachycardic  Abd: soft, incision intact, dry, small amount of expressed old blood from lower incision  Extremities: no edema  Neuro: AOx4  Lab Results: CBC  Recent Labs    05/25/18 0427 05/26/18 0503  WBC 7.5 7.9  HGB 11.9* 12.2*  HCT 36.6* 37.6*  PLT 252 273   BMET Recent Labs    05/25/18 0427 05/26/18 0503  NA 145 146*  K 4.2 3.7  CL 114* 114*  CO2 20* 18*  GLUCOSE 91 102*  BUN 16 16  CREATININE 0.83 1.02  CALCIUM 8.8* 9.0   PT/INR No results for input(s): LABPROT, INR in the last 72 hours. ABG No results for input(s): PHART, HCO3 in the last 72 hours.  Invalid input(s): PCO2, PO2  Studies/Results:  Anti-infectives: Anti-infectives (From admission, onward)   Start     Dose/Rate Route Frequency Ordered Stop   05/19/18 2200  cefoTEtan (CEFOTAN) 2 g in sodium chloride 0.9 % 100 mL IVPB     2 g 200 mL/hr over 30 Minutes Intravenous Every 12 hours 05/19/18 1623 05/19/18 2241   05/19/18 0900  cefoTEtan (CEFOTAN) 2 g in sodium chloride 0.9 % 100 mL IVPB     2 g 200 mL/hr over 30 Minutes Intravenous On call to O.R. 05/19/18 1610 05/19/18 1103   05/19/18 0900  cefoTEtan (CEFOTAN) 2 g in  sodium chloride 0.9 % 100 mL IVPB  Status:  Discontinued     2 g 200 mL/hr over 30 Minutes Intravenous On call to O.R. 05/19/18 4360 05/19/18 0856      Medications: Scheduled Meds: . enoxaparin (LOVENOX) injection  40 mg Subcutaneous Q24H  . furosemide  40 mg Oral Daily  . gabapentin  100 mg Oral BID  . metoprolol succinate  50 mg Oral Daily  . pravastatin  40 mg Oral q1800   Continuous Infusions: . 0.45 % NaCl with KCl 20 mEq / L 100 mL/hr at 05/27/18 0748  . famotidine (PEPCID)  IV 20 mg (05/26/18 2251)   PRN Meds:.alum & mag hydroxide-simeth, diphenhydrAMINE **OR** diphenhydrAMINE, hydrALAZINE, ipratropium-albuterol, morphine injection, ondansetron **OR** ondansetron (ZOFRAN) IV, phenol  Mickeal Skinner, MD Pg# [phone] [facility]

## 2018-05-28 MED ORDER — FAMOTIDINE 20 MG PO TABS
20.0000 mg | ORAL_TABLET | Freq: Two times a day (BID) | ORAL | Status: DC
  Administered 2018-05-28 – 2018-05-30 (×5): 20 mg via ORAL
  Filled 2018-05-28 (×5): qty 1

## 2018-05-28 NOTE — Progress Notes (Signed)
PHARMACIST - PHYSICIAN COMMUNICATION  DR:   CCS  CONCERNING: IV to Oral Route Change Policy  RECOMMENDATION: This patient is receiving Pepcid and Benadryl by the intravenous route.  Based on criteria approved by the Pharmacy and Therapeutics Committee, the intravenous medication(s) is/are being converted to the equivalent oral dose form(s).   DESCRIPTION: These criteria include:  The patient is eating (either orally or via tube) and/or has been taking other orally administered medications for a least 24 hours  The patient has no evidence of active gastrointestinal bleeding or impaired GI absorption (gastrectomy, short bowel, patient on TNA or NPO).   Diphenhydramine is not prescribed to treat or prevent a severe allergic reaction  Diphenhydramine is not prescribed as premedication prior to receiving blood product, biologic medication, antimicrobial, or chemotherapy agent  If you have questions about this conversion, please contact the Pharmacy Department  []   [phone] )  [facility] []   [phone] )  [facility] []   [phone] )  [facility] []   [phone] )  [facility] [x]   [phone] )  [place], PharmD, BCPS 05/28/2018 9:37 AM

## 2018-05-28 NOTE — Progress Notes (Signed)
Progress Note: General Surgery Service   Assessment/Plan: Patient Active Problem List   Diagnosis Date Noted  . Cancer of right colon ([place]) 05/24/2018  . Colonic mass 05/19/2018  . Colonic obstruction ([place]) 05/18/2018  . CAD (coronary artery disease), native coronary artery 03/14/2017  . PAF (paroxysmal atrial fibrillation) ([place])   . 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 s/p NSTEMI (non-ST elevated myocardial infarction); with subsequent urgent CABG  5/18 ([place]) 02/12/2017  . Chest pain 02/11/2017  . Overweight (BMI 25.0-29.9) 09/29/2016  . Alcohol use/ h/o heavy abuse 08/11/2016  . Left shoulder pain 01/24/2015  . GI bleed 11/14/2014  . h/o Anemia due to blood loss, acute 11/14/2014  . Eye pain   . Acute esophagitis   . Acute gastric ulcer   . Duodenal ulcer disease   . Upper GI bleed 11/13/2014  . Lumbar disc disease 01/30/2014  . Hearing loss 03/23/2013  . Screening for other and unspecified cardiovascular conditions 03/23/2013  . PAD (peripheral artery disease) ([place]) 10/14/2011  . Chronic low back pain 10/14/2011  . Glucose intolerance (impaired glucose tolerance) 10/12/2011  . Preventative health care 10/12/2011  . LEG CRAMPS 09/02/2010  . PERIPHERAL EDEMA 09/02/2010  . VARICOSE VEINS, LOWER EXTREMITIES 06/09/2009  . HLD (hyperlipidemia) 03/05/2008  . Anemia, iron deficiency 03/05/2008  . Hereditary and idiopathic peripheral neuropathy 03/05/2008  . HTN (hypertension) 03/05/2008  . GERD 03/05/2008  . IBS 03/05/2008  . Blood in stool 03/05/2008  . COLONIC POLYPS, HX OF 03/05/2008   s/p Procedure(s): EXPLORATORY LAPAROTOMY RIGHT COLECTOMY 05/19/2018 Diet as tolerated.  Start dressing changes.       LOS: 10 days  Chief Complaint/Subjective: Tolerated diet.  No overnight events.    Objective: Vital signs in last 24 hours: Temp:  [98 F (36.7 C)-98.9 F (37.2 C)] 98.5 F (36.9 C) (08/18 0544) Pulse Rate:  [63-104] 63 (08/18 0544) Resp:  [17-18] 17 (08/18 0544) BP:  (127-138)/(79-85) 138/85 (08/18 0544) SpO2:  [94 %-96 %] 96 % (08/18 0544) Last BM Date: 05/26/18  Intake/Output from previous day: 08/17 0701 - 08/18 0700 In: 2508 [P.O.:240; I.V.:2168; IV Piggyback:100] Out: 1225 [Urine:1225] Intake/Output this shift: No intake/output data recorded.  Lungs: breathing comfortably. Neuro:  HOH Cardiovascular: RR&R  ZTI:WPYK purulent drainage above umbilicus.  Central portion of wound opened and packed.    Extremities: no edema  General: AOx4.  NAD  Lab Results: CBC  Recent Labs    05/26/18 0503  WBC 7.9  HGB 12.2*  HCT 37.6*  PLT 273   BMET Recent Labs    05/26/18 0503  NA 146*  K 3.7  CL 114*  CO2 18*  GLUCOSE 102*  BUN 16  CREATININE 1.02  CALCIUM 9.0   PT/INR No results for input(s): LABPROT, INR in the last 72 hours. ABG No results for input(s): PHART, HCO3 in the last 72 hours.  Invalid input(s): PCO2, PO2  Studies/Results:  Anti-infectives: Anti-infectives (From admission, onward)   Start     Dose/Rate Route Frequency Ordered Stop   05/19/18 2200  cefoTEtan (CEFOTAN) 2 g in sodium chloride 0.9 % 100 mL IVPB     2 g 200 mL/hr over 30 Minutes Intravenous Every 12 hours 05/19/18 1623 05/19/18 2241   05/19/18 0900  cefoTEtan (CEFOTAN) 2 g in sodium chloride 0.9 % 100 mL IVPB     2 g 200 mL/hr over 30 Minutes Intravenous On call to O.R. 05/19/18 9983 05/19/18 1103   05/19/18 0900  cefoTEtan (CEFOTAN) 2 g in  sodium chloride 0.9 % 100 mL IVPB  Status:  Discontinued     2 g 200 mL/hr over 30 Minutes Intravenous On call to O.R. 05/19/18 9470 05/19/18 0856      Medications: Scheduled Meds: . enoxaparin (LOVENOX) injection  40 mg Subcutaneous Q24H  . furosemide  40 mg Oral Daily  . gabapentin  100 mg Oral BID  . metoprolol succinate  50 mg Oral Daily  . pravastatin  40 mg Oral q1800   Continuous Infusions: . 0.45 % NaCl with KCl 20 mEq / L 100 mL/hr at 05/28/18 0600  . famotidine (PEPCID) IV Stopped (05/27/18  2214)   PRN Meds:.acetaminophen, alum & mag hydroxide-simeth, diphenhydrAMINE **OR** diphenhydrAMINE, hydrALAZINE, ipratropium-albuterol, morphine injection, ondansetron **OR** ondansetron (ZOFRAN) IV, phenol  Stark Klein, MD  [facility]

## 2018-05-29 IMAGING — DX DG CHEST 2V
2 series · 2 of 2 positions shown · non-contrast
Comparison: 09/01/2016, 11/13/2014 and earlier.

CLINICAL DATA: 85-year-old presenting with acute onset of chest
pain radiating into both arms and into the neck that began
approximately 4 hours ago. Current history of hypertension.

EXAM:
CHEST  2 VIEW

[chest pa]
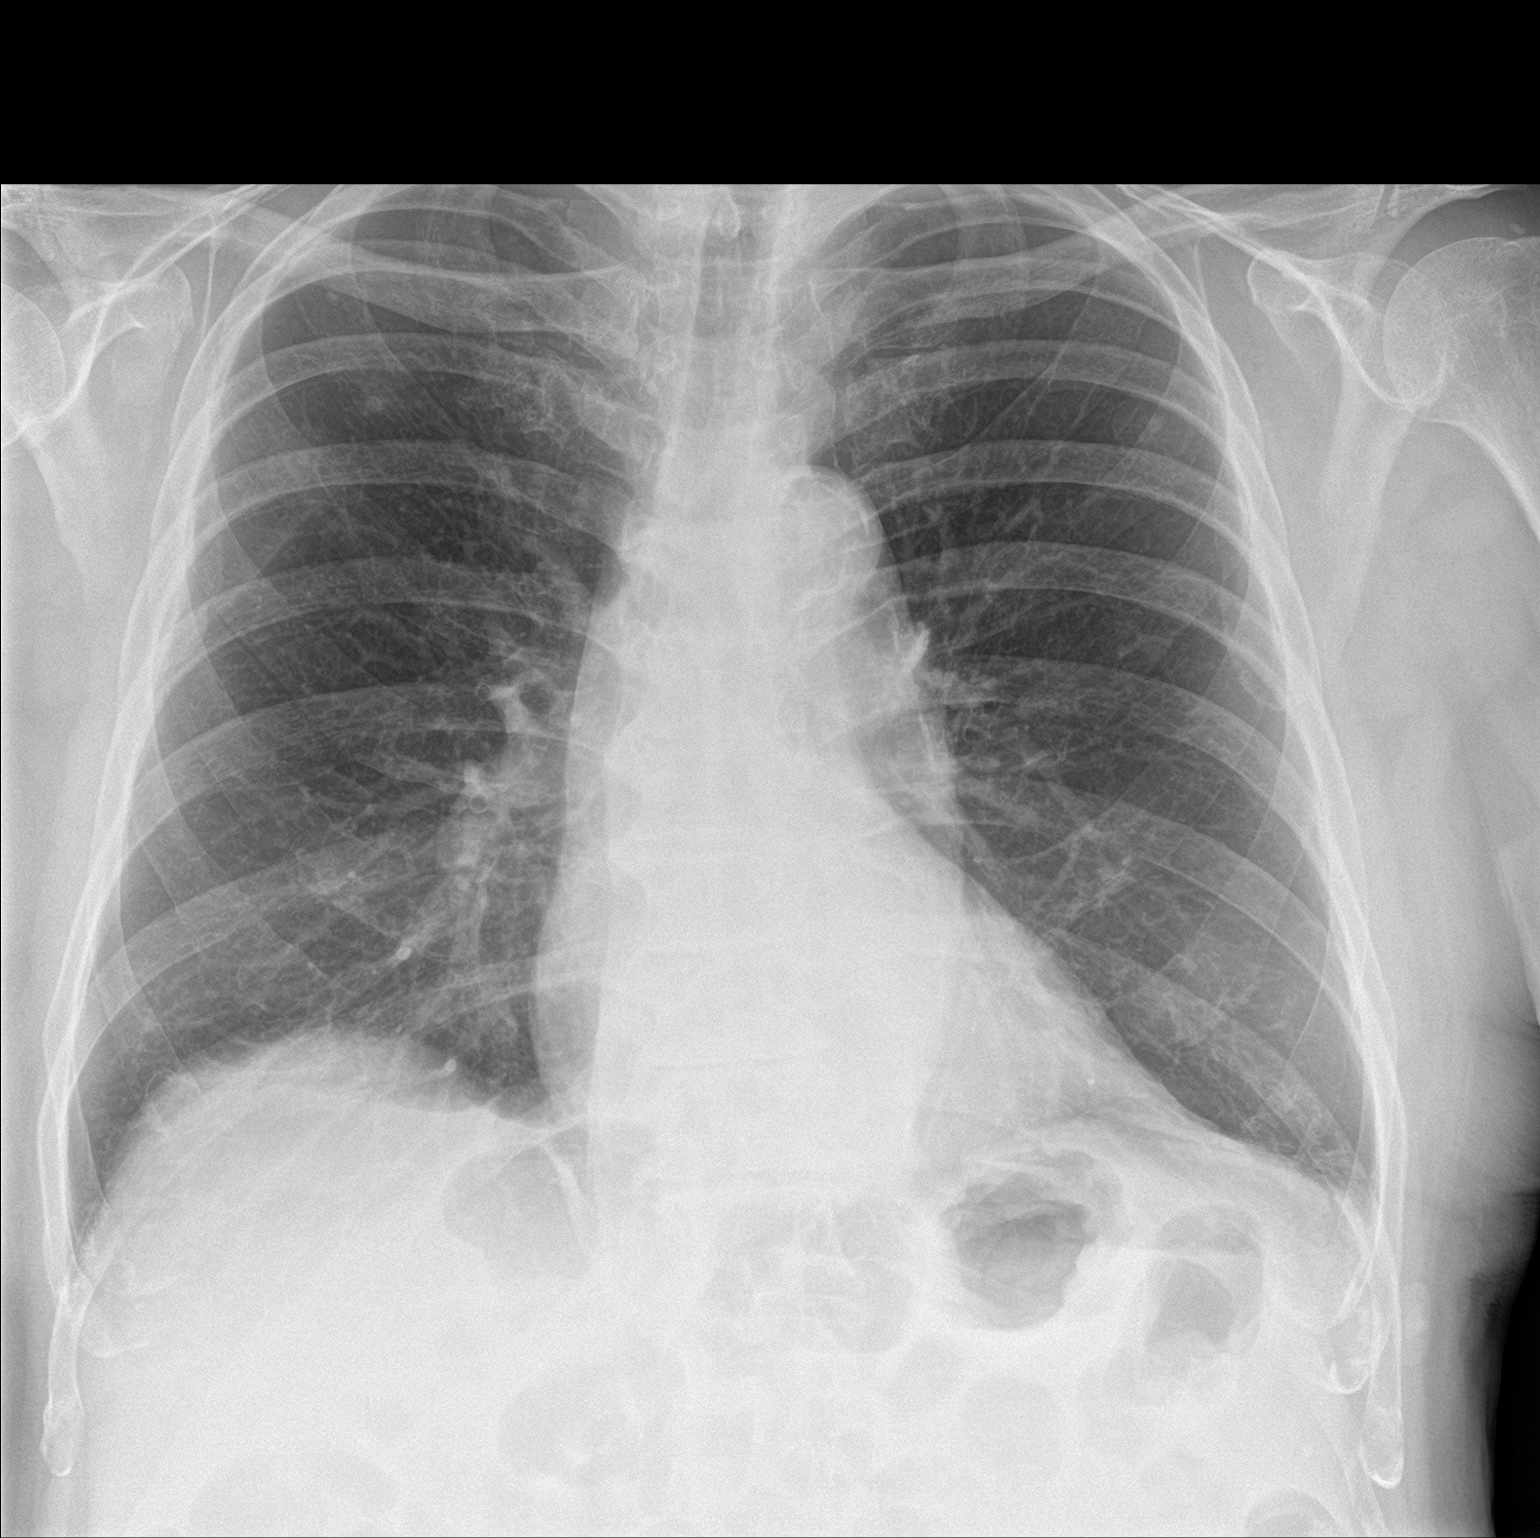

[chest lat]
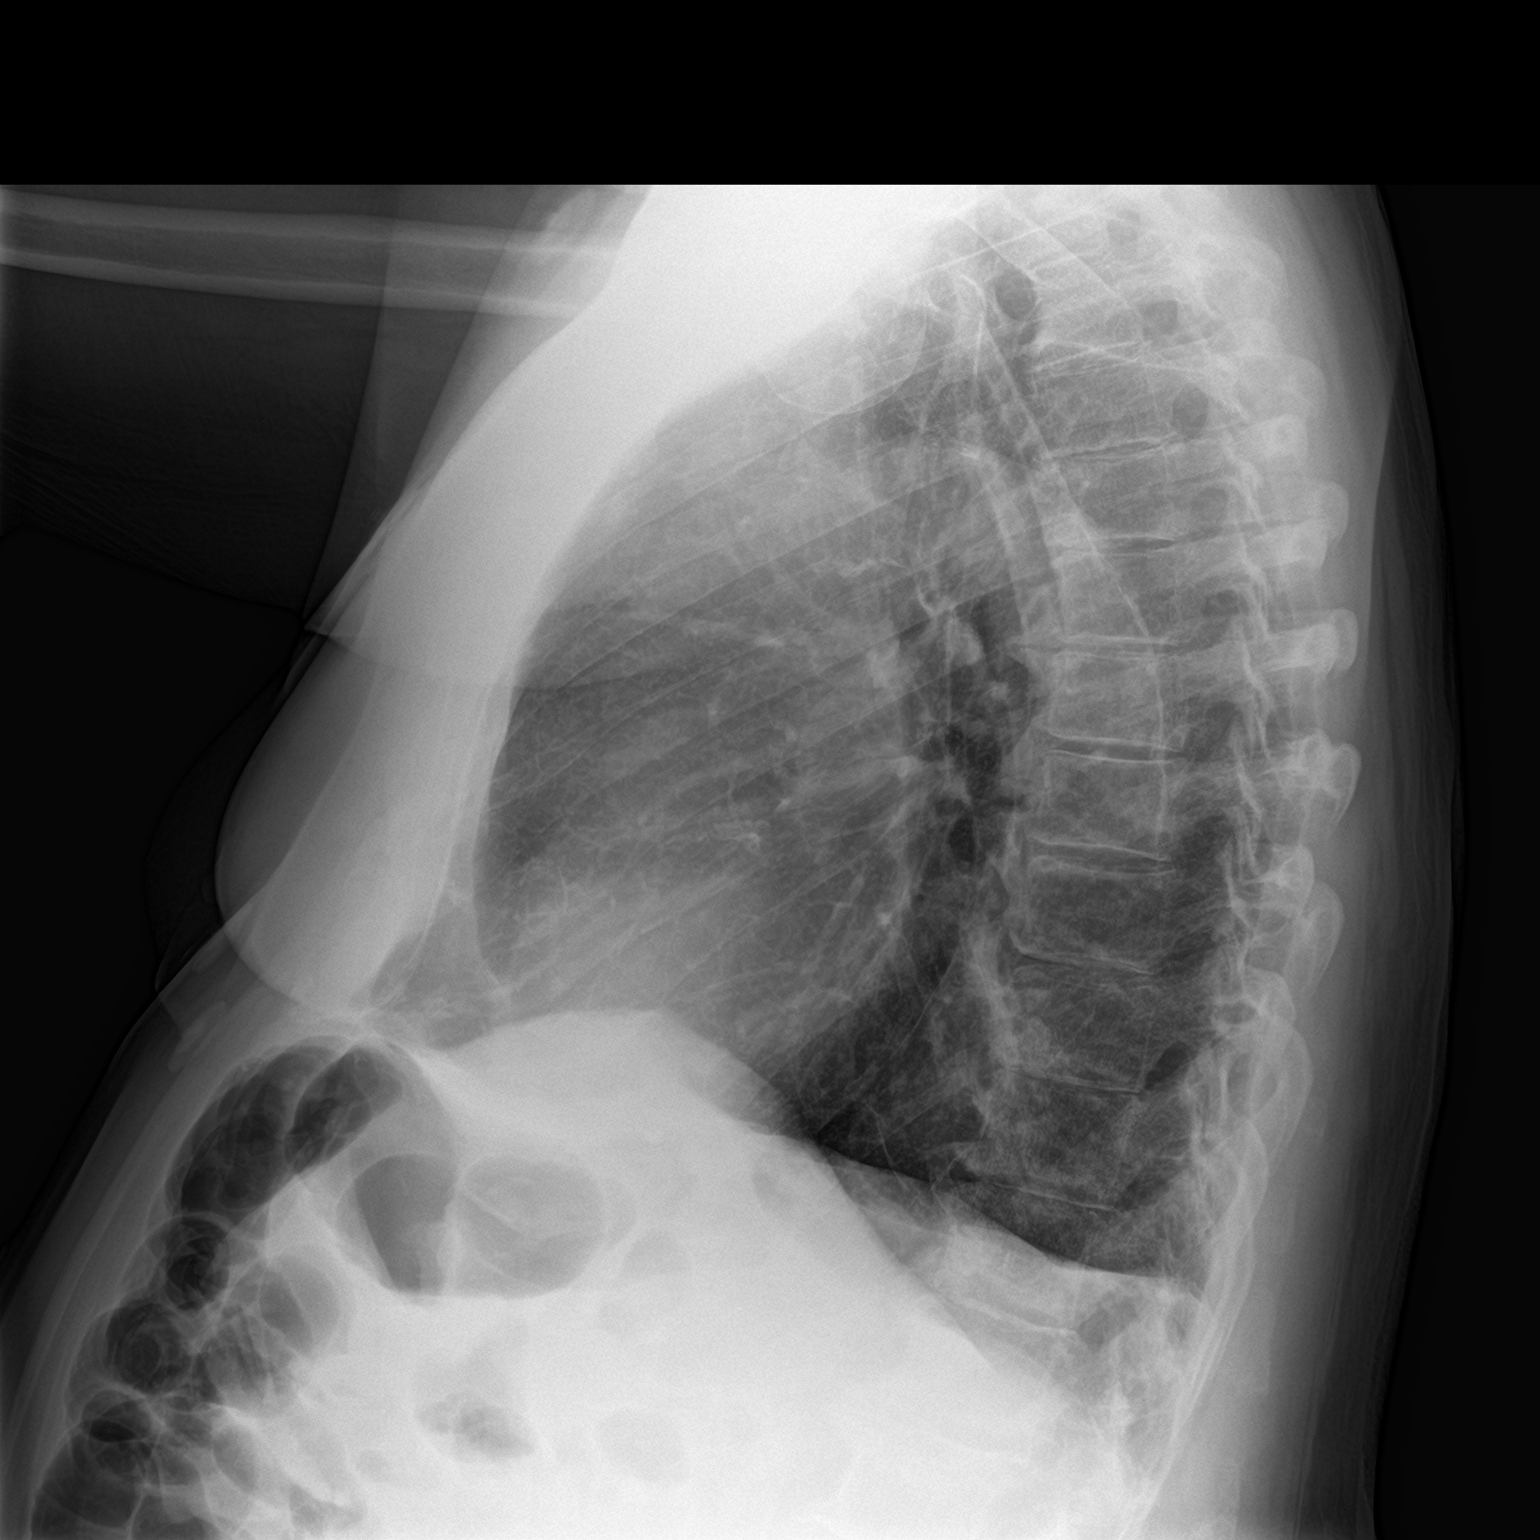

[2 of 2 positions shown; findings below may reference images not displayed]

FINDINGS: Cardiac silhouette normal in size, unchanged. Thoracic aorta
tortuous and atherosclerotic, unchanged. Linear scarring in the
lower lobes and lingula, unchanged. Nodules in the upper lobes,
dense for their small size, unchanged dating back to 2429 and
therefore indicating calcified granulomata. No new nodules in either
lung. Pulmonary vascularity normal. Bronchovascular markings. No
pleural effusions. No pneumothorax. Degenerative changes involving
the thoracic and upper lumbar spine.
IMPRESSION: No acute cardiopulmonary disease. Stable scarring in the lower lobes
and lingula. Stable small calcified granulomata in the upper lobes.

## 2018-05-29 MED ORDER — TRAMADOL HCL 50 MG PO TABS
50.0000 mg | ORAL_TABLET | Freq: Four times a day (QID) | ORAL | Status: DC | PRN
  Administered 2018-05-29: 50 mg via ORAL
  Filled 2018-05-29 (×2): qty 1

## 2018-05-29 MED ORDER — ZOLPIDEM TARTRATE 5 MG PO TABS
5.0000 mg | ORAL_TABLET | Freq: Once | ORAL | Status: AC
  Administered 2018-05-29: 5 mg via ORAL
  Filled 2018-05-29: qty 1

## 2018-05-29 MED ORDER — MORPHINE SULFATE (PF) 2 MG/ML IV SOLN
1.0000 mg | INTRAVENOUS | Status: DC | PRN
  Administered 2018-05-29 – 2018-05-30 (×2): 2 mg via INTRAVENOUS
  Filled 2018-05-29 (×2): qty 1

## 2018-05-29 MED ORDER — DOCUSATE SODIUM 100 MG PO CAPS
100.0000 mg | ORAL_CAPSULE | Freq: Two times a day (BID) | ORAL | Status: DC
  Administered 2018-05-29 – 2018-05-30 (×2): 100 mg via ORAL
  Filled 2018-05-29 (×2): qty 1

## 2018-05-29 NOTE — Progress Notes (Signed)
Physical Therapy Treatment Patient Details Name: Vincent Mcclure MRN: [id number] DOB: 1932/09/28 Today's Date: 05/29/2018    History of Present Illness Vincent Mcclure is a 84 y.o. male with medical history significant of CAD s/p CABG in 2018, PAF, GIB;  Patient presents to ED with onset of Abd pain, distention, N/V; s/p exp lap with colectomy  on 05/19/18     PT Comments    NG D/C and pt on a regular diet.  Assisted OOB to amb a greater distance.  Stiil requires need for a walker and had c/o feeling "tired". Required one sitting rest break and present with mild dyspnea.  RA 97% and HR 125.  Pt plans to D/C to home with spouse who he states can assist him at home.  Due to extended length of stay, pt would benefit from [facility] PT.   Follow Up Recommendations  Home health PT     Equipment Recommendations  None recommended by PT    Recommendations for Other Services       Precautions / Restrictions Precautions Precautions: Fall Restrictions Weight Bearing Restrictions: No    Mobility  Bed Mobility Overal bed mobility: Needs Assistance Bed Mobility: Supine to Sit     Supine to sit: Mod assist;Min assist     General bed mobility comments: assist with upper body to to recent ABD surgery and ABD distention  Transfers Overall transfer level: Needs assistance Equipment used: Rolling walker (2 wheeled) Transfers: Sit to/from [facility] Sit to Stand: Supervision;Min guard Stand pivot transfers: Supervision;Min guard       General transfer comment: one VC on hand placement prior to sit to control decend  Ambulation/Gait Ambulation/Gait assistance: Supervision;Min guard Gait Distance (Feet): 110 Feet(x one sitting rest break) Assistive device: Rolling walker (2 wheeled) Gait Pattern/deviations: Step-through pattern;Trunk flexed Gait velocity: decreased    General Gait Details: tolerated an increased distance with Mod c/o fatigue on exersion    required one sitting  rest break    feels weak      Stairs             Wheelchair Mobility    Modified Rankin (Stroke Patients Only)       Balance                                            Cognition Arousal/Alertness: Awake/alert Behavior During Therapy: WFL for tasks assessed/performed Overall Cognitive Status: Within Functional Limits for tasks assessed                                 General Comments: pleasant and friendly      Exercises      General Comments        Pertinent Vitals/Pain Pain Assessment: Faces Faces Pain Scale: Hurts a little bit Pain Location: ABD incision Pain Descriptors / Indicators: Tightness;Discomfort Pain Intervention(s): Monitored during session    Home Living                      Prior Function            PT Goals (current goals can now be found in the care plan section) Progress towards PT goals: Progressing toward goals    Frequency    Min 3X/week      PT Plan  Current plan remains appropriate    Co-evaluation              AM-PAC PT "6 Clicks" Daily Activity  Outcome Measure  Difficulty turning over in bed (including adjusting bedclothes, sheets and blankets)?: A Little Difficulty moving from lying on back to sitting on the side of the bed? : A Little Difficulty sitting down on and standing up from a chair with arms (e.g., wheelchair, bedside commode, etc,.)?: A Little Help needed moving to and from a bed to chair (including a wheelchair)?: A Little Help needed walking in hospital room?: A Little Help needed climbing 3-5 steps with a railing? : A Little 6 Click Score: 18    End of Session Equipment Utilized During Treatment: Gait belt Activity Tolerance: Patient tolerated treatment well Patient left: in chair;with chair alarm set;with call bell/phone within reach   PT Visit Diagnosis: Unsteadiness on feet (R26.81)     Time: 1610-9604 PT Time Calculation (min) (ACUTE ONLY):  30 min  Charges:  $Gait Training: 8-22 mins $Therapeutic Activity: 8-22 mins                     Rica Koyanagi  PTA [facility]  Acute  Rehab Pager      [phone]

## 2018-05-29 NOTE — Progress Notes (Signed)
Progress Note: General Surgery Service   Assessment/Plan: Patient Active Problem List   Diagnosis Date Noted  . Cancer of right colon ([place]) 05/24/2018  . Colonic mass 05/19/2018  . Colonic obstruction ([place]) 05/18/2018  . CAD (coronary artery disease), native coronary artery 03/14/2017  . PAF (paroxysmal atrial fibrillation) ([place])   . 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 s/p NSTEMI (non-ST elevated myocardial infarction); with subsequent urgent CABG  5/18 ([place]) 02/12/2017  . Chest pain 02/11/2017  . Overweight (BMI 25.0-29.9) 09/29/2016  . Alcohol use/ h/o heavy abuse 08/11/2016  . Left shoulder pain 01/24/2015  . GI bleed 11/14/2014  . h/o Anemia due to blood loss, acute 11/14/2014  . Eye pain   . Acute esophagitis   . Acute gastric ulcer   . Duodenal ulcer disease   . Upper GI bleed 11/13/2014  . Lumbar disc disease 01/30/2014  . Hearing loss 03/23/2013  . Screening for other and unspecified cardiovascular conditions 03/23/2013  . PAD (peripheral artery disease) ([place]) 10/14/2011  . Chronic low back pain 10/14/2011  . Glucose intolerance (impaired glucose tolerance) 10/12/2011  . Preventative health care 10/12/2011  . LEG CRAMPS 09/02/2010  . PERIPHERAL EDEMA 09/02/2010  . VARICOSE VEINS, LOWER EXTREMITIES 06/09/2009  . HLD (hyperlipidemia) 03/05/2008  . Anemia, iron deficiency 03/05/2008  . Hereditary and idiopathic peripheral neuropathy 03/05/2008  . HTN (hypertension) 03/05/2008  . GERD 03/05/2008  . IBS 03/05/2008  . Blood in stool 03/05/2008  . COLONIC POLYPS, HX OF 03/05/2008   s/p Procedure(s): EXPLORATORY LAPAROTOMY RIGHT COLECTOMY 05/19/2018 Diet as tolerated.  Start dressing changes.  Sleep hygiene, Discharge planning      LOS: 11 days  Chief Complaint/Subjective: Tolerated diet.  No overnight events.    Objective: Vital signs in last 24 hours: Temp:  [97.8 F (36.6 C)-98 F (36.7 C)] 98 F (36.7 C) (08/18 1946) Pulse Rate:  [85-98] 85 (08/18 1946) Resp:   [18-20] 20 (08/18 1946) BP: (122-131)/(76-83) 131/76 (08/18 1946) SpO2:  [94 %-100 %] 100 % (08/18 1946) Last BM Date: 05/26/18  Intake/Output from previous day: 08/18 0701 - 08/19 0700 In: 1262.3 [P.O.:440; I.V.:822.3] Out: 1500 [Urine:1500] Intake/Output this shift: No intake/output data recorded.  Lungs: breathing comfortably. Neuro:  HOH Cardiovascular: RR&R  FYB:OFBP, mildly distended, nontender Central portion of wound opened and packed.    Extremities: no edema  General: AOx4.  NAD  Lab Results: CBC  No results for input(s): WBC, HGB, HCT, PLT in the last 72 hours. BMET No results for input(s): NA, K, CL, CO2, GLUCOSE, BUN, CREATININE, CALCIUM in the last 72 hours. PT/INR No results for input(s): LABPROT, INR in the last 72 hours. ABG No results for input(s): PHART, HCO3 in the last 72 hours.  Invalid input(s): PCO2, PO2  Studies/Results:  Anti-infectives: Anti-infectives (From admission, onward)   Start     Dose/Rate Route Frequency Ordered Stop   05/19/18 2200  cefoTEtan (CEFOTAN) 2 g in sodium chloride 0.9 % 100 mL IVPB     2 g 200 mL/hr over 30 Minutes Intravenous Every 12 hours 05/19/18 1623 05/19/18 2241   05/19/18 0900  cefoTEtan (CEFOTAN) 2 g in sodium chloride 0.9 % 100 mL IVPB     2 g 200 mL/hr over 30 Minutes Intravenous On call to O.R. 05/19/18 0854 05/19/18 1103   05/19/18 0900  cefoTEtan (CEFOTAN) 2 g in sodium chloride 0.9 % 100 mL IVPB  Status:  Discontinued     2 g 200 mL/hr over 30 Minutes Intravenous On  call to O.R. 05/19/18 0131 05/19/18 0856      Medications: Scheduled Meds: . docusate sodium  100 mg Oral BID  . enoxaparin (LOVENOX) injection  40 mg Subcutaneous Q24H  . famotidine  20 mg Oral BID  . furosemide  40 mg Oral Daily  . gabapentin  100 mg Oral BID  . metoprolol succinate  50 mg Oral Daily  . pravastatin  40 mg Oral q1800   Continuous Infusions:  PRN Meds:.acetaminophen, alum & mag hydroxide-simeth, diphenhydrAMINE  **OR** [DISCONTINUED] diphenhydrAMINE, hydrALAZINE, ipratropium-albuterol, morphine injection, ondansetron **OR** ondansetron (ZOFRAN) IV, phenol, traMADol  Clovis Riley, MD  [facility]

## 2018-05-30 LAB — CBC
HEMATOCRIT: 35.1 % — AB (ref 39.0–52.0)
Hemoglobin: 11.4 g/dL — ABNORMAL LOW (ref 13.0–17.0)
MCH: 28.9 pg (ref 26.0–34.0)
MCHC: 32.5 g/dL (ref 30.0–36.0)
MCV: 89.1 fL (ref 78.0–100.0)
Platelets: 286 10*3/uL (ref 150–400)
RBC: 3.94 MIL/uL — AB (ref 4.22–5.81)
RDW: 14.4 % (ref 11.5–15.5)
WBC: 8.5 10*3/uL (ref 4.0–10.5)

## 2018-05-30 LAB — BASIC METABOLIC PANEL
Anion gap: 7 (ref 5–15)
BUN: 9 mg/dL (ref 8–23)
CO2: 31 mmol/L (ref 22–32)
CREATININE: 0.89 mg/dL (ref 0.61–1.24)
Calcium: 8 mg/dL — ABNORMAL LOW (ref 8.9–10.3)
Chloride: 101 mmol/L (ref 98–111)
GFR calc Af Amer: >60 mL/min (ref >60)
GFR calc non Af Amer: >60 mL/min (ref >60)
Glucose, Bld: 128 mg/dL — ABNORMAL HIGH (ref 70–99)
POTASSIUM: 3.1 mmol/L — AB (ref 3.5–5.1)
Sodium: 139 mmol/L (ref 135–145)

## 2018-05-30 LAB — MAGNESIUM: Magnesium: 1.7 mg/dL (ref 1.7–2.4)

## 2018-05-30 MED ORDER — POTASSIUM CHLORIDE CRYS ER 20 MEQ PO TBCR
40.0000 meq | EXTENDED_RELEASE_TABLET | Freq: Three times a day (TID) | ORAL | Status: DC
  Administered 2018-05-30: 40 meq via ORAL
  Filled 2018-05-30: qty 2

## 2018-05-30 MED ORDER — ACETAMINOPHEN 500 MG PO TABS: 500.0000 mg | ORAL_TABLET | Freq: Four times a day (QID) | ORAL | 0 refills | Status: AC | PRN

## 2018-05-30 MED ORDER — ALUM & MAG HYDROXIDE-SIMETH 200-200-20 MG/5ML PO SUSP: 30.0000 mL | Freq: Four times a day (QID) | ORAL | 0 refills | Status: DC | PRN

## 2018-05-30 MED ORDER — TRAMADOL HCL 50 MG PO TABS: 50.0000 mg | ORAL_TABLET | Freq: Four times a day (QID) | ORAL | 1 refills | Status: DC | PRN

## 2018-05-30 MED ORDER — DOCUSATE SODIUM 100 MG PO CAPS: 100.0000 mg | ORAL_CAPSULE | Freq: Two times a day (BID) | ORAL | 0 refills | Status: DC

## 2018-05-30 MED ORDER — POLYETHYLENE GLYCOL 3350 17 G PO PACK: 17.0000 g | PACK | Freq: Every day | ORAL | 0 refills | Status: DC

## 2018-05-30 MED ORDER — GABAPENTIN 100 MG PO CAPS: 100.0000 mg | ORAL_CAPSULE | Freq: Two times a day (BID) | ORAL | 0 refills | Status: DC

## 2018-05-30 NOTE — Care Management Note (Signed)
Case Management Note  Patient Details  Name: Vincent Mcclure MRN: [id number] Date of Birth: 08/07/1932  Subjective/Objective:   84 yo admitted with Colonic obstruction.  Exp Lap with R hemicolectomy on 05/19/18               Action/Plan: From home with spouse. Home health orders received and [facility] chosen for home health services. [facility] rep alerted of referral. Staff RN to do teaching with pt wife on dressing changes prior to DC. No DME needs expressed.  Expected Discharge Date:  05/30/18               Expected Discharge Plan:  [place]  In-House Referral:     Discharge planning Services  CM Consult  Post Acute Care Choice:  Home Health Choice offered to:  Patient  DME Arranged:    DME Agency:     HH Arranged:  RN, PT HH Agency:  [facility]  Status of Service:  Completed, signed off  If discussed at [place] of Stay Meetings, dates discussed:    Additional Comments:  30 Day Unplanned Readmission Risk Score     ED to Hosp-Admission (Current) from 05/18/2018 in [place] [facility] ONCOLOGY  30 Day Unplanned Readmission Risk Score (%)  18 Filed at 05/30/2018 0800     This score is the patient's risk of an unplanned readmission within 30 days of being discharged (0 -100%). The score is based on dignosis, age, lab data, medications, orders, and past utilization.   Low:  0-14.9   Medium: 15-21.9   High: 22-29.9   Extreme: 30 and above        Readmission Risk Prevention Plan 05/30/2018 05/23/2018  Transportation Screening - Complete  PCP or Specialist Appt within 5-7 Days Complete -  Home Care Screening Complete -  Medication Review (RN CM) Complete -  Some recent data might be hidden    Lynnell Catalan, RN 05/30/2018, 11:08 AM  [phone]

## 2018-05-30 NOTE — Discharge Summary (Addendum)
[facility] Discharge Summary   Patient ID: Vincent Mcclure MRN: [id number] DOB/AGE: 1932/07/17 84 y.o.  Admit date: 05/18/2018 Discharge date: 05/30/2018  Admitting Diagnosis: Obstructing right colon mass  Discharge Diagnosis Patient Active Problem List   Diagnosis Date Noted  . Cancer of right colon ([place]) 05/24/2018  . Colonic mass 05/19/2018  . Colonic obstruction ([place]) 05/18/2018  . CAD (coronary artery disease), native coronary artery 03/14/2017  . PAF (paroxysmal atrial fibrillation) ([place])   . 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 s/p NSTEMI (non-ST elevated myocardial infarction); with subsequent urgent CABG  5/18 ([place]) 02/12/2017  . Chest pain 02/11/2017  . Overweight (BMI 25.0-29.9) 09/29/2016  . Alcohol use/ h/o heavy abuse 08/11/2016  . Left shoulder pain 01/24/2015  . GI bleed 11/14/2014  . h/o Anemia due to blood loss, acute 11/14/2014  . Eye pain   . Acute esophagitis   . Acute gastric ulcer   . Duodenal ulcer disease   . Upper GI bleed 11/13/2014  . Lumbar disc disease 01/30/2014  . Hearing loss 03/23/2013  . Screening for other and unspecified cardiovascular conditions 03/23/2013  . PAD (peripheral artery disease) ([place]) 10/14/2011  . Chronic low back pain 10/14/2011  . Glucose intolerance (impaired glucose tolerance) 10/12/2011  . Preventative health care 10/12/2011  . LEG CRAMPS 09/02/2010  . PERIPHERAL EDEMA 09/02/2010  . VARICOSE VEINS, LOWER EXTREMITIES 06/09/2009  . HLD (hyperlipidemia) 03/05/2008  . Anemia, iron deficiency 03/05/2008  . Hereditary and idiopathic peripheral neuropathy 03/05/2008  . HTN (hypertension) 03/05/2008  . GERD 03/05/2008  . IBS 03/05/2008  . Blood in stool 03/05/2008  . COLONIC POLYPS, HX OF 03/05/2008    Consultants Internal medicine Oncology  Imaging: No results found.  Procedures [name] (05/19/18) - exploratory laparotomy, right hemicolectomy with primary anastomosis  Hospital Course:  Patient is a 84  year old male who presented to [facility] with abdominal pain.  Workup showed obstructing right colonic mass.  Patient was admitted and underwent procedure listed above.  Tolerated procedure well and was transferred to the floor. Patient did develop post-operative ileus which was treated with decompression and bowel rest. Ileus started to improve 8/16. Diet was advanced as tolerated. Drainage expressed from midline wound 8/17 and several staples removed from midline wound 8/18. Open portion of incision packed wet to dry. PT worked with patient on multiple occasions and did not initially feel he would need any follow up but after prolonged hospital stay recommended home PT. Pathology for colon mass resulted as invasive adenocarcinoma with 1/8 positive nodes, oncology consulted but patient declined chemotherapy and radiation at this time.   On POD#11, the patient was voiding well, tolerating diet, ambulating well, pain well controlled, vital signs stable, incision stable and felt stable for discharge home.  Patient will follow up in our office later this week and knows to call with questions or concerns. He will call to confirm appointment date/time. Patient also to follow up with Dr. [name] in 1 month and his PCP in the next 1-2 weeks.   Physical Exam: General:  Alert, NAD, pleasant, comfortable Card: regular rate and rhythm, pedal pulses 2+ BL, no LE edema Pulm: normal effort, CTAB Abd:  Soft, mildly distended, mild tenderness, incision with mid-portion open and good granulation tissue, staples present inferiorly and superiorly, +BS Ext: phlebitis in BL upper extremities greatly improved  Allergies as of 05/30/2018      Reactions   No Known Allergies       Medication List  TAKE these medications   acetaminophen 500 MG tablet Commonly known as:  TYLENOL Take 1 tablet (500 mg total) by mouth every 6 (six) hours as needed for mild pain.   alum & mag hydroxide-simeth 200-200-20 MG/5ML  suspension Commonly known as:  MAALOX/MYLANTA Take 30 mLs by mouth every 6 (six) hours as needed for indigestion (gas/bloating).   aspirin 81 MG EC tablet Take 1 tablet (81 mg total) by mouth daily.   docusate sodium 100 MG capsule Commonly known as:  COLACE Take 1 capsule (100 mg total) by mouth 2 (two) times daily.   ferrous sulfate 325 (65 FE) MG EC tablet Take 1 tablet (325 mg total) by mouth 3 (three) times daily with meals.   furosemide 40 MG tablet Commonly known as:  LASIX Take 1 tablet (40 mg total) by mouth daily.   gabapentin 100 MG capsule Commonly known as:  NEURONTIN Take 1 capsule (100 mg total) by mouth 2 (two) times daily.   metoprolol succinate 50 MG 24 hr tablet Commonly known as:  TOPROL-XL Take 1 tablet (50 mg total) by mouth daily. Take with or immediately following a meal.   polyethylene glycol packet Commonly known as:  MIRALAX / GLYCOLAX Take 17 g by mouth daily.   pravastatin 40 MG tablet Commonly known as:  PRAVACHOL I po q hs What changed:    how much to take  how to take this  when to take this   traMADol 50 MG tablet Commonly known as:  ULTRAM Take 1 tablet (50 mg total) by mouth every 6 (six) hours as needed for moderate pain or severe pain.        Follow-up Information    Fanny Skates, MD. Go on 06/02/2018.   Specialty:  General Surgery Why:  Appointment scheduled for 2:45 PM. Please arrive 30 min prior to appointment time for check in and paperwork. Bring photo ID and insurance information.  Contact information: [address] [place] [address] [phone]        [name], DO Follow up.   Specialty:  Family Medicine Why:  Call and scheduled a follow up appointment for the next 1-2 weeks for post-hospital visit.  Contact information: [address] [place] [address] [phone]        Truitt Merle, MD. Call.   Specialties:  Hematology, Oncology Why:  Call and arrange a follow up  appointment in 1 month. Contact information: [place] [address] [phone]           Signed: Brigid Re, [facility] 05/30/2018, 9:11 AM Pager: [phone] Consults: [phone] Mon-Fri 7:00 am-4:30 pm Sat-Sun 7:00 am-11:30 am

## 2018-05-30 NOTE — Discharge Instructions (Signed)
[facility]      [facility] °[phone] ° °OPEN ABDOMINAL SURGERY: POST OP INSTRUCTIONS ° °Always review your discharge instruction sheet given to you by the facility where your surgery was performed. ° °IF YOU HAVE DISABILITY OR FAMILY LEAVE FORMS, YOU MUST BRING THEM TO THE OFFICE FOR PROCESSING.  PLEASE DO NOT GIVE THEM TO YOUR DOCTOR. ° °1. A prescription for pain medication may be given to you upon discharge.  Take your pain medication as prescribed, if needed.  If narcotic pain medicine is not needed, then you may take acetaminophen (Tylenol) or ibuprofen (Advil) as needed. °2. Take your usually prescribed medications unless otherwise directed. °3. If you need a refill on your pain medication, please contact your pharmacy. They will contact our office to request authorization.  Prescriptions will not be filled after 5pm or on week-ends. °4. You should follow a light diet the first few days after arrival home, such as soup and crackers, pudding, etc.unless your doctor has advised otherwise. A high-fiber, low fat diet can be resumed as tolerated.   Be sure to include lots of fluids daily. Most patients will experience some swelling and bruising on the chest and neck area.  Ice packs will help.  Swelling and bruising can take several days to resolve °5. Most patients will experience some swelling and bruising in the area of the incision. Ice pack will help. Swelling and bruising can take several days to resolve..  °6. It is common to experience some constipation if taking pain medication after surgery.  Increasing fluid intake and taking a stool softener will usually help or prevent this problem from occurring.  A mild laxative (Milk of Magnesia or Miralax) should be taken according to package directions if there are no bowel movements after 48 hours. °7.  You may have steri-strips (small skin tapes) in place directly over the incision.  These strips should be left on the skin for 7-10 days.  If your  surgeon used skin glue on the incision, you may shower in 24 hours.  The glue will flake off over the next 2-3 weeks.  Any sutures or staples will be removed at the office during your follow-up visit. You may find that a light gauze bandage over your incision may keep your staples from being rubbed or pulled. You may shower and replace the bandage daily. °8. ACTIVITIES:  You may resume regular (light) daily activities beginning the next day--such as daily self-care, walking, climbing stairs--gradually increasing activities as tolerated.  You may have sexual intercourse when it is comfortable.  Refrain from any heavy lifting or straining until approved by your doctor. °a. You may drive when you no longer are taking prescription pain medication, you can comfortably wear a seatbelt, and you can safely maneuver your car and apply brakes °b. Return to Work: ___________________________________ °9. You should see your doctor in the office for a follow-up appointment approximately two weeks after your surgery.  Make sure that you call for this appointment within a day or two after you arrive home to insure a convenient appointment time. °OTHER INSTRUCTIONS:  °_____________________________________________________________ °_____________________________________________________________ ° °WHEN TO CALL YOUR DOCTOR: °1. Fever over 101.0 °2. Inability to urinate °3. Nausea and/or vomiting °4. Extreme swelling or bruising °5. Continued bleeding from incision. °6. Increased pain, redness, or drainage from the incision. °7. Difficulty swallowing or breathing °8. Muscle cramping or spasms. °9. Numbness or tingling in hands or feet or around lips. ° °The clinic staff is available to   answer your questions during regular business hours.  Please dont hesitate to call and ask to speak to one of the nurses if you have concerns.  For further questions, please visit [email or web address]   MIDLINE WOUND CARE: - midline  dressing to be changed 1-2 times daily - supplies: sterile saline, gauze, scissors, tape  - remove dressing and all packing carefully, moistening with sterile saline as needed to avoid packing/internal dressing sticking to the wound. - clean edges of skin around the wound with water/gauze, making sure there is no tape debris or leakage left on skin that could cause skin irritation or breakdown. - dampen and clean gauze with sterile saline and pack wound from wound base to skin level, making sure to take note of any possible areas of wound tracking, tunneling and packing appropriately. Wound can be packed loosely. Trim gauze to size if a whole gauze is not required. - cover wound with a dry ABD pad and secure with tape.  - write the date/time on the dry dressing/tape to better track when the last dressing change occurred. - apply any skin protectant/powder recommended by clinician to protect skin/skin folds. - change dressing as needed if leakage occurs, wound gets contaminated, or patient requests to shower. - patient may shower daily with wound open and following the shower the wound should be dried and a clean dressing placed.

## 2018-05-30 NOTE — Progress Notes (Signed)
Pt d/c home via wheelchair with his wife. Both verbalized understanding of DC instructions

## 2018-06-01 DIAGNOSIS — I2510 Atherosclerotic heart disease of native coronary artery without angina pectoris: Secondary | ICD-10-CM | POA: Diagnosis not present

## 2018-06-01 DIAGNOSIS — C182 Malignant neoplasm of ascending colon: Secondary | ICD-10-CM | POA: Diagnosis not present

## 2018-06-01 DIAGNOSIS — K9189 Other postprocedural complications and disorders of digestive system: Secondary | ICD-10-CM | POA: Diagnosis not present

## 2018-06-01 DIAGNOSIS — T8141XA Infection following a procedure, superficial incisional surgical site, initial encounter: Secondary | ICD-10-CM | POA: Diagnosis not present

## 2018-06-01 DIAGNOSIS — K567 Ileus, unspecified: Secondary | ICD-10-CM | POA: Diagnosis not present

## 2018-06-01 DIAGNOSIS — I10 Essential (primary) hypertension: Secondary | ICD-10-CM | POA: Diagnosis not present

## 2018-06-02 ENCOUNTER — Inpatient Hospital Stay (HOSPITAL_COMMUNITY)
Admission: EM | Admit: 2018-06-02 | Discharge: 2018-06-22 | DRG: 291 | Disposition: A | Discharge status: Skilled Nursing Facility | Payer: Medicare HMO | Source: Ambulatory Visit | Attending: Internal Medicine | Admitting: Internal Medicine

## 2018-06-02 ENCOUNTER — Emergency Department (HOSPITAL_COMMUNITY): Payer: Medicare HMO

## 2018-06-02 ENCOUNTER — Other Ambulatory Visit: Payer: Self-pay

## 2018-06-02 ENCOUNTER — Encounter (HOSPITAL_COMMUNITY): Payer: Self-pay | Admitting: *Deleted

## 2018-06-02 DIAGNOSIS — I5033 Acute on chronic diastolic (congestive) heart failure: Secondary | ICD-10-CM | POA: Diagnosis present

## 2018-06-02 DIAGNOSIS — R600 Localized edema: Secondary | ICD-10-CM

## 2018-06-02 DIAGNOSIS — I5031 Acute diastolic (congestive) heart failure: Secondary | ICD-10-CM | POA: Diagnosis not present

## 2018-06-02 DIAGNOSIS — F05 Delirium due to known physiological condition: Secondary | ICD-10-CM

## 2018-06-02 DIAGNOSIS — J189 Pneumonia, unspecified organism: Secondary | ICD-10-CM

## 2018-06-02 DIAGNOSIS — T8131XA Disruption of external operation (surgical) wound, not elsewhere classified, initial encounter: Secondary | ICD-10-CM

## 2018-06-02 DIAGNOSIS — R410 Disorientation, unspecified: Secondary | ICD-10-CM

## 2018-06-02 DIAGNOSIS — Z789 Other specified health status: Secondary | ICD-10-CM | POA: Diagnosis present

## 2018-06-02 DIAGNOSIS — T8130XS Disruption of wound, unspecified, sequela: Secondary | ICD-10-CM | POA: Diagnosis not present

## 2018-06-02 DIAGNOSIS — Z9049 Acquired absence of other specified parts of digestive tract: Secondary | ICD-10-CM

## 2018-06-02 DIAGNOSIS — I509 Heart failure, unspecified: Secondary | ICD-10-CM

## 2018-06-02 DIAGNOSIS — J9601 Acute respiratory failure with hypoxia: Secondary | ICD-10-CM | POA: Diagnosis present

## 2018-06-02 DIAGNOSIS — K589 Irritable bowel syndrome without diarrhea: Secondary | ICD-10-CM | POA: Diagnosis present

## 2018-06-02 DIAGNOSIS — I10 Essential (primary) hypertension: Secondary | ICD-10-CM | POA: Diagnosis not present

## 2018-06-02 DIAGNOSIS — E876 Hypokalemia: Secondary | ICD-10-CM | POA: Diagnosis not present

## 2018-06-02 DIAGNOSIS — Z7289 Other problems related to lifestyle: Secondary | ICD-10-CM | POA: Diagnosis present

## 2018-06-02 DIAGNOSIS — I4891 Unspecified atrial fibrillation: Secondary | ICD-10-CM | POA: Diagnosis present

## 2018-06-02 DIAGNOSIS — Z86010 Personal history of colonic polyps: Secondary | ICD-10-CM

## 2018-06-02 DIAGNOSIS — R0600 Dyspnea, unspecified: Secondary | ICD-10-CM

## 2018-06-02 DIAGNOSIS — D630 Anemia in neoplastic disease: Secondary | ICD-10-CM | POA: Diagnosis present

## 2018-06-02 DIAGNOSIS — I214 Non-ST elevation (NSTEMI) myocardial infarction: Secondary | ICD-10-CM | POA: Diagnosis present

## 2018-06-02 DIAGNOSIS — T8149XA Infection following a procedure, other surgical site, initial encounter: Secondary | ICD-10-CM

## 2018-06-02 DIAGNOSIS — E8809 Other disorders of plasma-protein metabolism, not elsewhere classified: Secondary | ICD-10-CM | POA: Diagnosis present

## 2018-06-02 DIAGNOSIS — Z9889 Other specified postprocedural states: Secondary | ICD-10-CM

## 2018-06-02 DIAGNOSIS — E785 Hyperlipidemia, unspecified: Secondary | ICD-10-CM | POA: Diagnosis present

## 2018-06-02 DIAGNOSIS — D62 Acute posthemorrhagic anemia: Secondary | ICD-10-CM | POA: Diagnosis present

## 2018-06-02 DIAGNOSIS — F1090 Alcohol use, unspecified, uncomplicated: Secondary | ICD-10-CM | POA: Diagnosis present

## 2018-06-02 DIAGNOSIS — L0291 Cutaneous abscess, unspecified: Secondary | ICD-10-CM

## 2018-06-02 DIAGNOSIS — R609 Edema, unspecified: Secondary | ICD-10-CM

## 2018-06-02 DIAGNOSIS — I480 Paroxysmal atrial fibrillation: Secondary | ICD-10-CM | POA: Diagnosis not present

## 2018-06-02 DIAGNOSIS — K219 Gastro-esophageal reflux disease without esophagitis: Secondary | ICD-10-CM | POA: Diagnosis present

## 2018-06-02 DIAGNOSIS — Z79899 Other long term (current) drug therapy: Secondary | ICD-10-CM

## 2018-06-02 DIAGNOSIS — Z9841 Cataract extraction status, right eye: Secondary | ICD-10-CM

## 2018-06-02 DIAGNOSIS — Z9981 Dependence on supplemental oxygen: Secondary | ICD-10-CM

## 2018-06-02 DIAGNOSIS — T8131XS Disruption of external operation (surgical) wound, not elsewhere classified, sequela: Secondary | ICD-10-CM

## 2018-06-02 DIAGNOSIS — G629 Polyneuropathy, unspecified: Secondary | ICD-10-CM | POA: Diagnosis present

## 2018-06-02 DIAGNOSIS — K651 Peritoneal abscess: Secondary | ICD-10-CM

## 2018-06-02 DIAGNOSIS — R109 Unspecified abdominal pain: Secondary | ICD-10-CM

## 2018-06-02 DIAGNOSIS — C182 Malignant neoplasm of ascending colon: Secondary | ICD-10-CM | POA: Diagnosis present

## 2018-06-02 DIAGNOSIS — I252 Old myocardial infarction: Secondary | ICD-10-CM

## 2018-06-02 DIAGNOSIS — K668 Other specified disorders of peritoneum: Secondary | ICD-10-CM | POA: Diagnosis present

## 2018-06-02 DIAGNOSIS — R627 Adult failure to thrive: Secondary | ICD-10-CM | POA: Diagnosis present

## 2018-06-02 DIAGNOSIS — I272 Pulmonary hypertension, unspecified: Secondary | ICD-10-CM | POA: Diagnosis present

## 2018-06-02 DIAGNOSIS — Z951 Presence of aortocoronary bypass graft: Secondary | ICD-10-CM

## 2018-06-02 DIAGNOSIS — Y95 Nosocomial condition: Secondary | ICD-10-CM | POA: Diagnosis not present

## 2018-06-02 DIAGNOSIS — F1010 Alcohol abuse, uncomplicated: Secondary | ICD-10-CM | POA: Diagnosis present

## 2018-06-02 DIAGNOSIS — E782 Mixed hyperlipidemia: Secondary | ICD-10-CM | POA: Diagnosis not present

## 2018-06-02 DIAGNOSIS — I2510 Atherosclerotic heart disease of native coronary artery without angina pectoris: Secondary | ICD-10-CM | POA: Diagnosis present

## 2018-06-02 DIAGNOSIS — Z8249 Family history of ischemic heart disease and other diseases of the circulatory system: Secondary | ICD-10-CM

## 2018-06-02 DIAGNOSIS — T8189XA Other complications of procedures, not elsewhere classified, initial encounter: Secondary | ICD-10-CM | POA: Diagnosis not present

## 2018-06-02 DIAGNOSIS — J440 Chronic obstructive pulmonary disease with acute lower respiratory infection: Secondary | ICD-10-CM | POA: Diagnosis not present

## 2018-06-02 DIAGNOSIS — R0602 Shortness of breath: Secondary | ICD-10-CM | POA: Diagnosis not present

## 2018-06-02 DIAGNOSIS — K9189 Other postprocedural complications and disorders of digestive system: Secondary | ICD-10-CM | POA: Diagnosis present

## 2018-06-02 DIAGNOSIS — J9602 Acute respiratory failure with hypercapnia: Secondary | ICD-10-CM | POA: Diagnosis present

## 2018-06-02 DIAGNOSIS — Z808 Family history of malignant neoplasm of other organs or systems: Secondary | ICD-10-CM

## 2018-06-02 DIAGNOSIS — Z9842 Cataract extraction status, left eye: Secondary | ICD-10-CM

## 2018-06-02 DIAGNOSIS — I110 Hypertensive heart disease with heart failure: Principal | ICD-10-CM | POA: Diagnosis present

## 2018-06-02 DIAGNOSIS — A419 Sepsis, unspecified organism: Secondary | ICD-10-CM | POA: Diagnosis not present

## 2018-06-02 DIAGNOSIS — I5020 Unspecified systolic (congestive) heart failure: Secondary | ICD-10-CM | POA: Diagnosis not present

## 2018-06-02 DIAGNOSIS — J918 Pleural effusion in other conditions classified elsewhere: Secondary | ICD-10-CM | POA: Diagnosis present

## 2018-06-02 DIAGNOSIS — J90 Pleural effusion, not elsewhere classified: Secondary | ICD-10-CM

## 2018-06-02 DIAGNOSIS — Z87891 Personal history of nicotine dependence: Secondary | ICD-10-CM

## 2018-06-02 DIAGNOSIS — I739 Peripheral vascular disease, unspecified: Secondary | ICD-10-CM | POA: Diagnosis present

## 2018-06-02 DIAGNOSIS — T8132XD Disruption of internal operation (surgical) wound, not elsewhere classified, subsequent encounter: Secondary | ICD-10-CM

## 2018-06-02 DIAGNOSIS — I5032 Chronic diastolic (congestive) heart failure: Secondary | ICD-10-CM

## 2018-06-02 DIAGNOSIS — T8143XA Infection following a procedure, organ and space surgical site, initial encounter: Secondary | ICD-10-CM | POA: Diagnosis present

## 2018-06-02 DIAGNOSIS — R0902 Hypoxemia: Secondary | ICD-10-CM

## 2018-06-02 DIAGNOSIS — R531 Weakness: Secondary | ICD-10-CM | POA: Diagnosis not present

## 2018-06-02 DIAGNOSIS — Z7982 Long term (current) use of aspirin: Secondary | ICD-10-CM

## 2018-06-02 DIAGNOSIS — Z66 Do not resuscitate: Secondary | ICD-10-CM | POA: Diagnosis present

## 2018-06-02 DIAGNOSIS — H9190 Unspecified hearing loss, unspecified ear: Secondary | ICD-10-CM | POA: Diagnosis present

## 2018-06-02 LAB — CBC WITH DIFFERENTIAL/PLATELET
BASOS PCT: 0 %
Basophils Absolute: 0 10*3/uL (ref 0.0–0.1)
EOS ABS: 0 10*3/uL (ref 0.0–0.7)
EOS PCT: 0 %
HCT: 36.9 % — ABNORMAL LOW (ref 39.0–52.0)
HEMOGLOBIN: 11.9 g/dL — AB (ref 13.0–17.0)
LYMPHS ABS: 0.6 10*3/uL — AB (ref 0.7–4.0)
Lymphocytes Relative: 6 %
MCH: 28.7 pg (ref 26.0–34.0)
MCHC: 32.2 g/dL (ref 30.0–36.0)
MCV: 89.1 fL (ref 78.0–100.0)
MONOS PCT: 10 %
Monocytes Absolute: 0.9 10*3/uL (ref 0.1–1.0)
NEUTROS PCT: 84 %
Neutro Abs: 8.2 10*3/uL — ABNORMAL HIGH (ref 1.7–7.7)
PLATELETS: 425 10*3/uL — AB (ref 150–400)
RBC: 4.14 MIL/uL — AB (ref 4.22–5.81)
RDW: 14.2 % (ref 11.5–15.5)
WBC: 9.7 10*3/uL (ref 4.0–10.5)

## 2018-06-02 LAB — COMPREHENSIVE METABOLIC PANEL
ALBUMIN: 2.3 g/dL — AB (ref 3.5–5.0)
ALT: 34 U/L (ref 0–44)
AST: 47 U/L — ABNORMAL HIGH (ref 15–41)
Alkaline Phosphatase: 121 U/L (ref 38–126)
Anion gap: 15 (ref 5–15)
BUN: 12 mg/dL (ref 8–23)
CALCIUM: 8.3 mg/dL — AB (ref 8.9–10.3)
CO2: 31 mmol/L (ref 22–32)
Chloride: 95 mmol/L — ABNORMAL LOW (ref 98–111)
Creatinine, Ser: 1.01 mg/dL (ref 0.61–1.24)
GFR calc non Af Amer: >60 mL/min (ref >60)
Glucose, Bld: 121 mg/dL — ABNORMAL HIGH (ref 70–99)
Potassium: 2.7 mmol/L — CL (ref 3.5–5.1)
SODIUM: 141 mmol/L (ref 135–145)
Total Bilirubin: 0.5 mg/dL (ref 0.3–1.2)
Total Protein: 5.7 g/dL — ABNORMAL LOW (ref 6.5–8.1)

## 2018-06-02 LAB — BRAIN NATRIURETIC PEPTIDE: B Natriuretic Peptide: 165.5 pg/mL — ABNORMAL HIGH (ref 0.0–100.0)

## 2018-06-02 LAB — CG4 I-STAT (LACTIC ACID): Lactic Acid, Venous: 1.5 mmol/L (ref 0.5–1.9)

## 2018-06-02 LAB — LIPASE, BLOOD: Lipase: 103 U/L — ABNORMAL HIGH (ref 11–51)

## 2018-06-02 LAB — POCT I-STAT TROPONIN I: Troponin i, poc: 0.03 ng/mL (ref 0.00–0.08)

## 2018-06-02 LAB — PROTIME-INR
INR: 1.15
PROTHROMBIN TIME: 14.6 s (ref 11.4–15.2)

## 2018-06-02 MED ORDER — KCL IN DEXTROSE-NACL 40-5-0.45 MEQ/L-%-% IV SOLN
INTRAVENOUS | Status: DC
  Administered 2018-06-03: 01:00:00 via INTRAVENOUS
  Filled 2018-06-02: qty 1000

## 2018-06-02 MED ORDER — FAMOTIDINE IN NACL 20-0.9 MG/50ML-% IV SOLN
20.0000 mg | Freq: Two times a day (BID) | INTRAVENOUS | Status: DC
  Administered 2018-06-03: 20 mg via INTRAVENOUS
  Filled 2018-06-02: qty 50

## 2018-06-02 MED ORDER — POTASSIUM CHLORIDE CRYS ER 20 MEQ PO TBCR
40.0000 meq | EXTENDED_RELEASE_TABLET | Freq: Once | ORAL | Status: AC
  Administered 2018-06-02: 40 meq via ORAL
  Filled 2018-06-02: qty 2

## 2018-06-02 MED ORDER — ENOXAPARIN SODIUM 40 MG/0.4ML ~~LOC~~ SOLN
40.0000 mg | Freq: Every day | SUBCUTANEOUS | Status: DC
  Administered 2018-06-03 – 2018-06-05 (×3): 40 mg via SUBCUTANEOUS
  Filled 2018-06-02 (×3): qty 0.4

## 2018-06-02 MED ORDER — ONDANSETRON 4 MG PO TBDP: 4.0000 mg | ORAL_TABLET | Freq: Four times a day (QID) | ORAL | Status: DC | PRN

## 2018-06-02 MED ORDER — FUROSEMIDE 10 MG/ML IJ SOLN
INTRAMUSCULAR | Status: AC
  Filled 2018-06-02: qty 4

## 2018-06-02 MED ORDER — FUROSEMIDE 10 MG/ML IJ SOLN
40.0000 mg | Freq: Once | INTRAMUSCULAR | Status: AC
  Administered 2018-06-02: 40 mg via INTRAVENOUS

## 2018-06-02 MED ORDER — ONDANSETRON HCL 4 MG/2ML IJ SOLN: 4.0000 mg | Freq: Four times a day (QID) | INTRAMUSCULAR | Status: DC | PRN

## 2018-06-02 MED ORDER — POTASSIUM CHLORIDE 10 MEQ/100ML IV SOLN
10.0000 meq | INTRAVENOUS | Status: AC
  Administered 2018-06-03: 10 meq via INTRAVENOUS
  Filled 2018-06-02: qty 100

## 2018-06-02 MED ORDER — POTASSIUM CHLORIDE 10 MEQ/100ML IV SOLN
10.0000 meq | Freq: Once | INTRAVENOUS | Status: AC
  Administered 2018-06-02: 10 meq via INTRAVENOUS
  Filled 2018-06-02: qty 100

## 2018-06-02 NOTE — H&P (Addendum)
Vincent Mcclure is an 84 y.o. male.    General Surgery [facility]  Chief Complaint: chest congestion, weakness, lower extremity edema  HPI: Patient is an 84 year old male known to our surgical service.  He was seen today at [facility] surgery office by Dr. Dalbert Mcclure.  The following office note was recorded:  Vincent Mcclure Documented: 06/02/2018 2:59 PM Location: [facility] Patient #: [id number] DOB: 1932/10/03 Married / Language: Vincent Mcclure / Race: White Male  History of Present Illness Vincent [facility] M. Dalbert Batman MD; 06/02/2018 5:14 PM) The patient is a 84 year old male presenting for a post-operative visit. This is an 84 year old man who returns for his first postop visit. He presented to [facility] on August 8 or August 9 with abdominal pain distention and vomiting and CT scan showed obstructing mass at the hepatic flexure but no signs of any metastatic disease. Comorbidities include history CABG, atrial fibrillation, hypertension and peripheral arterial disease We took him to the operating room the same day and perform exploratory laparotomy and right colectomy with primary anastomosis. 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 One out of 8 lymph nodes were positive. CEA is normal. He was seen by Dr. Morey Mcclure. It does not sound like he or Vincent Mcclure anxious about cytotoxic chemotherapy but Xeloda is being considered. He was to follow-up with her A small wound infection which was opened in the hospital  He is here today in a wheelchair. He cannot walk. It took 2 of [place] to get him up on the exam table. His lungs are congested. His wife is scared and frustrated. He says he is eating fine, voiding well and bowels are moving but is just very weak. He has not seen his primary care physician  Abdomen looked good. We remove the staples and repacked the incision which was not grossly infected. His lungs sound congested on both  sides. His wife says that the visiting nurse agency says he doesn't have enough insurance for them to come more than a couple of times a week She is unable to lift or move him She thinks he was sent home from the hospital o soon. Her Mcclure are unable to help her We called his primary care office near [facility] and they are closed until Monday I'm concerned about his declining health to thrive and possible pneumonia. I feel he is at risk of serious adverse outcome if we send him home today. We called [facility] emergency room and had him sent over there for further evaluation and possible admission by the medical Department  Otherwise I will see him back in 2 weeks  Past Surgical History Vincent Mcclure; 06/02/2018 2:59 PM) Colon Removal - Partial  Coronary Artery Bypass Graft  Spinal Surgery - Lower Back   Allergies Vincent Mcclure; 06/02/2018 2:59 PM) No Known Drug Allergies [06/02/2018]: Allergies Reconciled   Medication History (Jacqueline Mcclure; 06/02/2018 3:00 PM) Furosemide (40MG Tablet, Oral) Active. TraMADol HCl (50MG Tablet, Oral) Active. Gabapentin (100MG Capsule, Oral) Active. Iron (325 (65 Fe)MG Tablet, Oral) Active. Metoprolol Succinate ER (50MG Tablet ER 24HR, Oral) Active. Pravastatin Sodium (40MG Tablet, Oral) Active. Aspirin (81MG Tablet DR, Oral) Active. Colace (Oral) Specific strength unknown - Active. Medications Reconciled  Social History Vincent Mcclure; 06/02/2018 2:59 PM) Alcohol use  Heavy alcohol use. Caffeine use  Coffee. No drug use  Tobacco use  Former smoker.  Family History Vincent Mcclure; 06/02/2018 2:59 PM) Cancer  Mother. Heart Disease  Father.  Hypertension  Father.  Other Problems Vincent Mcclure; 06/02/2018 2:59 PM) Arthritis  Colon Cancer  High blood pressure  Vascular Disease   Review of Systems Vincent Mcclure; 06/02/2018 2:59 PM) General Present- Appetite Loss and Fatigue. Not Present-  Chills, Fever, Night Sweats, Weight Gain and Weight Loss. HEENT Present- Hearing Loss, Hoarseness and Wears glasses/contact lenses. Not Present- Earache, Nose Bleed, Oral Ulcers, Ringing in the Ears, Seasonal Allergies, Sinus Pain, Sore Throat, Visual Disturbances and Yellow Eyes. Cardiovascular Present- Leg Cramps, Shortness of Breath and Swelling of Extremities. Not Present- Chest Pain, Difficulty Breathing Lying Down, Palpitations and Rapid Heart Rate. Gastrointestinal Present- Bloating and Gets full quickly at meals. Not Present- Abdominal Pain, Bloody Stool, Change in Bowel Habits, Chronic diarrhea, Constipation, Difficulty Swallowing, Excessive gas, Hemorrhoids, Indigestion, Nausea, Rectal Pain and Vomiting.  Vitals Vincent Mcclure; 06/02/2018 3:01 PM) 06/02/2018 3:00 PM Height: 72in Weight Measurement Declined Temp.: 98.55F  Pulse: 88 (Regular)  BP: 132/68 (Sitting, Left Arm, Standard) Body weakness. Pt in wheel chair. too weak to stand for weight.  Physical Exam [facility] M. Dalbert Batman MD; 06/02/2018 5:14 PM) General Mental Status-Alert. General Appearance-Not in acute distress. Build & Nutrition-Well nourished. Posture-Normal posture. Gait-Normal. Note: Elderly. Alert. Acutely decondition. Answers questions appropriately. Color reasonable. Could not stand without 2 people lifting him up and take maximal effort to get him on the exam table and back in the wheelchair. Afebrile. Not tachycardic  Head and Neck Head-normocephalic, atraumatic with no lesions or palpable masses. Trachea-midline. Thyroid Gland Characteristics - normal size and consistency and no palpable nodules.  Chest and Lung Exam Chest and lung exam reveals -on auscultation, normal breath sounds, no adventitious sounds and normal vocal resonance. Note: Productive cough. Bilateral rhonchi. SPO2 91% on room air  Cardiovascular Cardiovascular examination reveals -normal heart sounds, regular  rate and rhythm with no murmurs and femoral artery auscultation bilaterally reveals normal pulses, no bruits, no thrills.  Abdomen Inspection Inspection of the abdomen reveals - No Hernias. Palpation/Percussion Palpation and Percussion of the abdomen reveal - Soft, Non Tender, No Rigidity (guarding), No hepatosplenomegaly and No Palpable abdominal masses. Note: Abdomen soft. Slightly trimmer. 6 cm area of open wound in the lower wound. Dressing was removed. No odor or purulence. Starting to granulate. Repacked. The rest of his staples were removed.   Neurologic Neurologic evaluation reveals -alert and oriented x 3 with no impairment of recent or remote memory, normal attention span and ability to concentrate, normal sensation and normal coordination.  Musculoskeletal Normal Exam - Bilateral-Upper Extremity Strength Normal and Lower Extremity Strength Normal. Note: 1+ ankle edema  Assessment & Plan [facility] M. Dalbert Batman MD; 06/02/2018 3:26 PM) CANCER OF RIGHT COLON (C18.2) Current Plans Follow up with [place] in the office in 2 weeks.  You were recently hospitalized for emergency surgery for a cancer of the right colon You have been seen by Dr. [name], medical oncology, and you need to follow up with her in a month or 2 yourr abdomen seems to be doing reasonably well You are tolerating a diet and having bowel movements The open area of the wound is clean. That needs to be packed twice a day  You're having increasing problems with cough and sputum production and I'm concerned you may have pneumonia Your very weak and can barely stand without 2 people helping you Your wife is unable to take care of you Your primary care office down in [facility] is closed until Monday The visiting nurse agency says that [employer] will not cover  their care  We have decided to send you to the [facility] emergency room for evaluation by the emergency department physician because of failure to thrive  and possible pneumonia You may need to be admitted to the medical service  Return to see Dr. Dalbert Mcclure in 2 weeks, regardless  HISTORY OF CORONARY ARTERY BYPASS GRAFT (Z95.1) HYPERTENSION, ESSENTIAL (I10) ATRIAL FIBRILLATION, TRANSIENT (I48.91)  Signed by [name], MD (06/02/2018 5:15 PM)  Patient now presents to the emergency department at [facility].  He is accompanied by his wife.  He complains of chest congestion, mild shortness of breath, lower extremity edema, and weakness.  Patient was evaluated by the emergency department physician and general surgery was asked to evaluate and admit for management.  Chest x-ray does not show any sign of infiltrate or pneumonia.  However there is pneumoperitoneum.  This may be residual from his surgery.  White blood cell count is normal.  Patient is afebrile.  Abdominal examination as documented below and above is essentially benign.   Past Medical History:  Diagnosis Date  . Alcohol abuse   . Anemia   . Chronic low back pain 10/14/2011  . Colon polyps    hyperplastic (2004, 2010) and adenomatous (1990).    . COLONIC POLYPS, HX OF 03/05/2008  . Esophageal stricture    hx of  . Gastric ulcer   . GERD 03/05/2008  . GERD (gastroesophageal reflux disease) 1994   associated peptic strictures  . HYPERLIPIDEMIA 03/05/2008  . HYPERTENSION 03/05/2008  . Hypertension   . IBS 03/05/2008  . Impaired glucose tolerance 10/12/2011  . Iron deficiency anemia   . LEG CRAMPS 09/02/2010  . PAD (peripheral artery disease) ([place]) 10/14/2011  . PAD (peripheral artery disease) ([place]) 2013  . PERIPHERAL EDEMA 09/02/2010  . PERIPHERAL NEUROPATHY 03/05/2008  . Prostatitis    hx of  . VARICOSE VEINS, LOWER EXTREMITIES 06/09/2009    Past Surgical History:  Procedure Laterality Date  . CATARACT EXTRACTION  bilat  . CORONARY ARTERY BYPASS GRAFT N/A 02/15/2017   Procedure: CORONARY ARTERY BYPASS GRAFTING times three  with left internal mammary harvest and  endoscopic harvest of Right SVG. Grafts of LIMA to  LAD, SVG to Distal Circ, and to First Diag.;  Surgeon: Grace Isaac, MD;  Location: [place];  Service: Open Heart Surgery;  Laterality: N/A;  . ESOPHAGOGASTRODUODENOSCOPY N/A 11/14/2014   Procedure: ESOPHAGOGASTRODUODENOSCOPY (EGD);  Surgeon: Jerene Bears, MD;  Location: [facility] ENDOSCOPY;  Service: Endoscopy;  Laterality: N/A;  . LAPAROTOMY N/A 05/19/2018   Procedure: EXPLORATORY LAPAROTOMY RIGHT COLECTOMY;  Surgeon: Fanny Skates, MD;  Location: [facility]S;  Service: General;  Laterality: N/A;  . LEFT HEART CATH AND CORONARY ANGIOGRAPHY N/A 02/14/2017   Procedure: Left Heart Cath and Coronary Angiography;  Surgeon: Belva Crome, MD;  Location: [place] [facility];  Service: Cardiovascular;  Laterality: N/A;  . LUMBAR SPINE SURGERY  11/2008   Dr Joya Salm  . TEE WITHOUT CARDIOVERSION N/A 02/15/2017   Procedure: TRANSESOPHAGEAL ECHOCARDIOGRAM (TEE);  Surgeon: Grace Isaac, MD;  Location: [place];  Service: Open Heart Surgery;  Laterality: N/A;    Family History  Problem Relation Age of Onset  . Cancer Mother        Brain Cancer  . Diabetes Father   . Heart disease Father        CAD  . Hypertension Father   . Stroke Father   . Diabetes Sister   . Stomach cancer Neg Hx   .  Pancreatic cancer Neg Hx   . Colon cancer Neg Hx   . Esophageal cancer Neg Hx    Social History:  reports that he quit smoking about 9 years ago. His smoking use included cigarettes. He has a 62.50 pack-year smoking history. He has quit using smokeless tobacco.  His smokeless tobacco use included chew. He reports that he drinks about 6.0 standard drinks of alcohol per week. He reports that he does not use drugs.  Allergies:  Allergies  Allergen Reactions  . No Known Allergies      (Not in a hospital admission)  Results for orders placed or performed during the hospital encounter of 06/02/18 (from the past 48 hour(s))  Comprehensive metabolic panel     Status: Abnormal    Collection Time: 06/02/18  5:00 PM  Result Value Ref Range   Sodium 141 135 - 145 mmol/L   Potassium 2.7 (LL) 3.5 - 5.1 mmol/L    Comment: CRITICAL RESULT CALLED TO, READ BACK BY AND VERIFIED WITH: Erasmo Score [id number] @ 1909 BY J SCOTTON    Chloride 95 (L) 98 - 111 mmol/L   CO2 31 22 - 32 mmol/L   Glucose, Bld 121 (H) 70 - 99 mg/dL   BUN 12 8 - 23 mg/dL   Creatinine, Ser 1.01 0.61 - 1.24 mg/dL   Calcium 8.3 (L) 8.9 - 10.3 mg/dL   Total Protein 5.7 (L) 6.5 - 8.1 g/dL   Albumin 2.3 (L) 3.5 - 5.0 g/dL   AST 47 (H) 15 - 41 U/L   ALT 34 0 - 44 U/L   Alkaline Phosphatase 121 38 - 126 U/L   Total Bilirubin 0.5 0.3 - 1.2 mg/dL   GFR calc non Af Amer >60 >60 mL/min   GFR calc Af Amer >60 >60 mL/min    Comment: (NOTE) The eGFR has been calculated using the CKD EPI equation. This calculation has not been validated in all clinical situations. eGFR's persistently <60 mL/min signify possible Chronic Kidney Disease.    Anion gap 15 5 - 15    Comment: Performed at [facility], [place] [address] [place], [place] [address]  CBC with Differential     Status: Abnormal   Collection Time: 06/02/18  5:00 PM  Result Value Ref Range   WBC 9.7 4.0 - 10.5 K/uL   RBC 4.14 (L) 4.22 - 5.81 MIL/uL   Hemoglobin 11.9 (L) 13.0 - 17.0 g/dL   HCT 36.9 (L) 39.0 - 52.0 %   MCV 89.1 78.0 - 100.0 fL   MCH 28.7 26.0 - 34.0 pg   MCHC 32.2 30.0 - 36.0 g/dL   RDW 14.2 11.5 - 15.5 %   Platelets 425 (H) 150 - 400 K/uL   Neutrophils Relative % 84 %   Neutro Abs 8.2 (H) 1.7 - 7.7 K/uL   Lymphocytes Relative 6 %   Lymphs Abs 0.6 (L) 0.7 - 4.0 K/uL   Monocytes Relative 10 %   Monocytes Absolute 0.9 0.1 - 1.0 K/uL   Eosinophils Relative 0 %   Eosinophils Absolute 0.0 0.0 - 0.7 K/uL   Basophils Relative 0 %   Basophils Absolute 0.0 0.0 - 0.1 K/uL    Comment: Performed at [facility], [name] [address], [place], [place] [address]  Lipase, blood     Status: Abnormal    Collection Time: 06/02/18  6:01 PM  Result Value Ref Range   Lipase 103 (H) 11 - 51 U/L    Comment: Performed  at [facility], [place] [address], [place], [place] [address]  Protime-INR     Status: None   Collection Time: 06/02/18  6:01 PM  Result Value Ref Range   Prothrombin Time 14.6 11.4 - 15.2 seconds   INR 1.15     Comment: Performed at [facility], [place] [address], [place], [place] [address]  Brain natriuretic peptide     Status: Abnormal   Collection Time: 06/02/18  6:01 PM  Result Value Ref Range   B Natriuretic Peptide 165.5 (H) 0.0 - 100.0 pg/mL    Comment: Performed at [facility], [place] [address] [place], [place] [address]  POCT i-Stat troponin I     Status: None   Collection Time: 06/02/18  6:14 PM  Result Value Ref Range   Troponin i, poc 0.03 0.00 - 0.08 ng/mL   Comment 3            Comment: Due to the release kinetics of cTnI, a negative result within the first hours of the onset of symptoms does not rule out myocardial infarction with certainty. If myocardial infarction is still suspected, repeat the test at appropriate intervals.   CG4 I-STAT (Lactic acid)     Status: None   Collection Time: 06/02/18  6:16 PM  Result Value Ref Range   Lactic Acid, Venous 1.50 0.5 - 1.9 mmol/L   Dg Chest 2 View  Result Date: 06/02/2018 CLINICAL DATA:  84 y/o M; laparoscopic partial colectomy 05/19/2018. Shortness of breath and congestion. EXAM: CHEST - 2 VIEW COMPARISON:  05/22/2018 chest radiograph FINDINGS: Stable normal cardiac silhouette given projection and technique. Post median sternotomy with wires in alignment. Aortic atherosclerosis with calcification. Clear lungs. No pleural effusion or pneumothorax. Pneumoperitoneum. No acute osseous abnormality is evident. IMPRESSION: 1. Pneumoperitoneum. 2. No acute pulmonary process identified. These results were called by telephone at the time of interpretation on 06/02/2018 at  5:46 pm to Dr. Vallery Ridge, who verbally acknowledged these results. Electronically Signed   By: Kristine Garbe M.D.   On: 06/02/2018 17:48    Review of Systems  Constitutional: Positive for malaise/fatigue. Negative for chills and fever.  HENT: Negative.   Eyes: Negative.   Respiratory: Positive for cough and shortness of breath.   Cardiovascular: Negative.   Gastrointestinal: Negative for abdominal pain, nausea and vomiting.  Genitourinary: Negative.   Musculoskeletal: Negative.   Skin: Negative.   Neurological: Positive for weakness.  Endo/Heme/Allergies: Negative.   Psychiatric/Behavioral: Negative.     Blood pressure 127/77, pulse (!) 112, temperature 98.3 F (36.8 C), temperature source Oral, resp. rate (!) 32, SpO2 97 %. Physical Exam  Constitutional: He is oriented to person, place, and time. He appears well-developed and well-nourished. No distress.  HENT:  Head: Normocephalic and atraumatic.  Right Ear: External ear normal.  Left Ear: External ear normal.  Mouth/Throat: No oropharyngeal exudate.  Eyes: Pupils are equal, round, and reactive to light. Conjunctivae are normal. No scleral icterus.  Neck: Normal range of motion. Neck supple. No tracheal deviation present. No thyromegaly present.  Cardiovascular: Normal rate, regular rhythm and normal heart sounds.  Respiratory: He has rales.  Bilateral coarse breath sounds, non-productive cough, visual increased work of breathing  GI: Soft. Bowel sounds are normal. He exhibits no distension and no mass. There is no tenderness. There is no rebound and no guarding.  Musculoskeletal: Normal range of motion. He exhibits edema (3+ bilat LE edema).  Neurological: He is alert and oriented to person,  place, and time.  Skin: Skin is warm and dry. He is not diaphoretic.  Psychiatric: He has a normal mood and affect. His behavior is normal.      Assessment/Plan History of partial colectomy for adenocarcinoma of colon  Open  abdominal wound healing by secondary intention  Pneumoperitoneum on CXR - possibly residual from surgery, no clinical evidence perforated viscus at this time, will monitor  Suspect congestive heart failure  Pulmonary congestion, increased work of breathing  LE edema  Will request [facility] consultation this evening  May need cardiology evaluation  Hypokalemia  IVF with 40 KCL  Give 3 runs KCL IVPB now  Repeat BMET in AM 8/24   Armandina Gemma, [facility] Office: [phone]    Earnstine Regal, MD 06/02/2018, 9:24 PM

## 2018-06-02 NOTE — Consult Note (Signed)
Vincent Mcclure BZJ:[id number] DOB: November 30, 1931 DOA: 06/02/2018  Hospitalist was called for consult for medical issues including possible CHF exacerbation hypokalemia    Assessment/Plan  84 y.o. male with medical history significant of CAD, Colon Cancer, close intolerance, GI bleed with melena, iron deficiency anemia, history of alcohol abuse, HTN,  Admitted for CHF exacerbation  Present on Admission: Acute diastolic CHF exacerbation patient appears to be fluid overloaded repeat echogram to evaluate for right heart failure given mostly peripheral edema.  Patient was given Lasix in the emergency department and responded with improvement in respiratory status.  Would benefit from cardiology consult in a.m. cycle cardiac enzymes obtain repeat echogram and diures . Hypokalemia being repleted by primary team will check magnesium level and replete as needed . HLD (hyperlipidemia) stable resume home medications   . Atrial fibrillation with RVR ([place]) -currently heart rates and low 100s blood pressure stable appears to be fluid overloaded.  Patient with long-standing history of atrial fibrillation previously not on anticoagulation secondary to recurrent bleeding continue  toprolol . Alcohol use/ h/o heavy abuse -past states have not had any drink since beginning of the month denies any history of withdrawals or tremors  In the past patient has been told he has borderline diabetes diet controlled not on any medications.  If any evidence of elevated blood sugars would start sliding scale Other plan as per orders.   Pneumoperitoneum-  as per primary team   PCP: [name], DO   Outpatient Specialists:  CARDS:  Dr. Tamala Julian    Oncology  none Patient arrived to ER on 06/02/18 at 1617  Patient coming from:    home Lives  With family    Chief Complaint:  Chief Complaint  Patient presents with  . Cough  . Shortness of Breath    HPI: Vincent Mcclure is a 84 y.o. male with medical history  significant of CAD, Colon Cancer, close intolerance, GI bleed with melena, iron deficiency anemia, history of alcohol abuse, HTN,    Presented with   Severe shortness of breath presented to surgery follow-up postop was noted to have significant work of breathing and shortness of breath requiring 2 L of oxygen.  On baseline not on oxygen 2 weeks out from partial colectomy  For colon mass.  Have not been seen by oncologist yet Increased work of breathing, leg edema for the past 3 days. Reports have been taking his lasix but his Potassium has been stopped for some reason after hs last admission. He ate some sausage a day ago but usually tries to stay away from salty food. He noticed decreased mobility, reports have had a bowel movement. Usually needs few pillows to lay down.  Denies any chest pain no fever.   Regarding pertinent Chronic problems: Hemoglobin A1c 5.9 in 2018 diet controlled Known history of CAD CABG in 2018 followed by cardiology on baby aspirin History of paroxysmal atrial fibrillation managed by cardiology not on anticoagulation secondary to recurrent bleeding he is no longer on amiodarone currently on Toprol Known history of peripheral arterial disease with claudication. Last echogram showed grade 1 diastolic CHF done in May 0175 normal EF  While in ER: BNP elevated 165  The following Work up has been ordered so far:  Orders Placed This Encounter  Procedures  . Urine culture  . Culture, blood (routine x 2)  . DG Chest 2 View  . Comprehensive metabolic panel  . CBC with Differential  . Lipase, blood  . Protime-INR  .  Brain natriuretic peptide  . Urinalysis, Routine w reflex microscopic  . CBC  . Creatinine, serum  . Creatinine, serum  . Basic metabolic panel  . Diet clear liquid Room service appropriate? Yes  . Saline Lock IV, Maintain IV access  . Cardiac monitoring  . Vital signs VS every 4 hours X 24 hours, then every 8 hours if stable.  After 48 hours, do not  awaken between 12MN and  6 AM if VS stable  . Notify physician  . Intake and Output  . Care order/instruction:  . Initiate Oral Care Protocol  . Initiate Carrier Fluid Protocol  . RN may order General Surgery PRN Orders (through manage orders) for the following patient needs:  cough (Robitussin DM), hemorrhoids (Tucks), indigestion (Maalox), minor skin irritation (Hydrocortisone Cream), or sore throat (chloraseptic spray / Cepacol).  . SCDs  . Bed rest with bathroom privileges only  . Full code  . Consult to general surgery  . Incentive spirometry  . Oxygen therapy Mode or (Route): Nasal cannula; Liters Per Minute: 2; Keep 02 saturation: greater than 92 %; Humidity: Yes  . I-Stat CG4 Lactic Acid, ED  . I-stat troponin, ED  . CG4 I-STAT (Lactic acid)  . POCT i-Stat troponin I  . ED EKG  . Saline lock IV  . Place in observation (patient's expected length of stay will be less than 2 midnights)      Following Medications were ordered in ER: Medications  potassium chloride 10 mEq in 100 mL IVPB (10 mEq Intravenous New Bag/Given 06/02/18 2052)  enoxaparin (LOVENOX) injection 40 mg (has no administration in time range)  dextrose 5 % and 0.45 % NaCl with KCl 40 mEq/L infusion (has no administration in time range)  ondansetron (ZOFRAN-ODT) disintegrating tablet 4 mg (has no administration in time range)    Or  ondansetron (ZOFRAN) injection 4 mg (has no administration in time range)  famotidine (PEPCID) IVPB 20 mg premix (has no administration in time range)  potassium chloride 10 mEq in 100 mL IVPB (has no administration in time range)  potassium chloride SA (K-DUR,KLOR-CON) CR tablet 40 mEq (40 mEq Oral Given 06/02/18 2039)    Significant initial  Findings: Abnormal Labs Reviewed  COMPREHENSIVE METABOLIC PANEL - Abnormal; Notable for the following components:      Result Value   Potassium 2.7 (*)    Chloride 95 (*)    Glucose, Bld 121 (*)    Calcium 8.3 (*)    Total Protein 5.7  (*)    Albumin 2.3 (*)    AST 47 (*)    All other components within normal limits  CBC WITH DIFFERENTIAL/PLATELET - Abnormal; Notable for the following components:   RBC 4.14 (*)    Hemoglobin 11.9 (*)    HCT 36.9 (*)    Platelets 425 (*)    Neutro Abs 8.2 (*)    Lymphs Abs 0.6 (*)    All other components within normal limits  LIPASE, BLOOD - Abnormal; Notable for the following components:   Lipase 103 (*)    All other components within normal limits  BRAIN NATRIURETIC PEPTIDE - Abnormal; Notable for the following components:   B Natriuretic Peptide 165.5 (*)    All other components within normal limits     Na 141 K 2.7  Cr  stable,    Lab Results  Component Value Date   CREATININE 1.01 06/02/2018   CREATININE 0.89 05/30/2018   CREATININE 1.02 05/26/2018  WBC  9.7  HG/HCT  Down   from baseline see below    Component Value Date/Time   HGB 11.9 (L) 06/02/2018 1700   HGB 14.1 01/17/2018 1401   HCT 36.9 (L) 06/02/2018 1700   HCT 42.9 01/17/2018 1401       Troponin (Point of Care Test) Recent Labs    06/02/18 1814  TROPIPOC 0.03       BNP (last 3 results) Recent Labs    06/02/18 1801  BNP 165.5*    ProBNP (last 3 results) No results for input(s): PROBNP in the last 8760 hours.  Lactic Acid, Venous    Component Value Date/Time   LATICACIDVEN 1.50 06/02/2018 1816      UA   ordered   CXR -  NON acute but positive for Pneumoperitoneum.    ECG:  Personally reviewed by me showing: HR : 109 Rhythm:  A.fib rvr   no evidence of ischemic changes QTC 365      ED Triage Vitals  Enc Vitals Group     BP 06/02/18 1626 (!) 144/69     Pulse Rate 06/02/18 1626 63     Resp 06/02/18 1626 (!) 22     Temp 06/02/18 1626 98.3 F (36.8 C)     Temp Source 06/02/18 1626 Oral     SpO2 06/02/18 1626 95 %     Weight --      Height --      Head Circumference --      Peak Flow --      Pain Score 06/02/18 1657 4     Pain Loc --      Pain Edu? --       Excl. in [place]? --   TMAX(24)@       Latest  Blood pressure 127/77, pulse (!) 112, temperature 98.3 F (36.8 C), temperature source Oral, resp. rate (!) 32, SpO2 97 %.      Review of Systems:    Pertinent positives include:  shortness of breath at rest. dyspnea on exertion,  excess mucus productive cough,  Constitutional:  No weight loss, night sweats, Fevers, chills, fatigue, weight loss  HEENT:  No headaches, Difficulty swallowing,Tooth/dental problems,Sore throat,  No sneezing, itching, ear ache, nasal congestion, post nasal drip,  Cardio-vascular:  No chest pain, Orthopnea, PND, anasarca, dizziness, palpitations.no Bilateral lower extremity swelling  GI:  No heartburn, indigestion, abdominal pain, nausea, vomiting, diarrhea, change in bowel habits, loss of appetite, melena, blood in stool, hematemesis Resp:   No coughing up of blood.No change in color of mucus. No wheezing. Skin:  no rash or lesions. No jaundice GU:  no dysuria, change in color of urine, no urgency or frequency. No straining to urinate.  No flank pain.  Musculoskeletal:  No joint pain or no joint swelling. No decreased range of motion. No back pain.  Psych:  No change in mood or affect. No depression or anxiety. No memory loss.  Neuro: no localizing neurological complaints, no tingling, no weakness, no double vision, no gait abnormality, no slurred speech, no confusion  All systems reviewed and apart from [place] all are negative  Past Medical History:   Past Medical History:  Diagnosis Date  . Alcohol abuse   . Anemia   . Chronic low back pain 10/14/2011  . Colon polyps    hyperplastic (2004, 2010) and adenomatous (1990).    . COLONIC POLYPS, HX OF 03/05/2008  . Esophageal stricture    hx of  . Gastric  ulcer   . GERD 03/05/2008  . GERD (gastroesophageal reflux disease) 1994   associated peptic strictures  . HYPERLIPIDEMIA 03/05/2008  . HYPERTENSION 03/05/2008  . Hypertension   . IBS 03/05/2008  .  Impaired glucose tolerance 10/12/2011  . Iron deficiency anemia   . LEG CRAMPS 09/02/2010  . PAD (peripheral artery disease) ([place]) 10/14/2011  . PAD (peripheral artery disease) ([place]) 2013  . PERIPHERAL EDEMA 09/02/2010  . PERIPHERAL NEUROPATHY 03/05/2008  . Prostatitis    hx of  . VARICOSE VEINS, LOWER EXTREMITIES 06/09/2009      Past Surgical History:  Procedure Laterality Date  . CATARACT EXTRACTION  bilat  . CORONARY ARTERY BYPASS GRAFT N/A 02/15/2017   Procedure: CORONARY ARTERY BYPASS GRAFTING times three  with left internal mammary harvest and endoscopic harvest of Right SVG. Grafts of LIMA to  LAD, SVG to Distal Circ, and to First Diag.;  Surgeon: Grace Isaac, MD;  Location: [facility];  Service: Open Heart Surgery;  Laterality: N/A;  . ESOPHAGOGASTRODUODENOSCOPY N/A 11/14/2014   Procedure: ESOPHAGOGASTRODUODENOSCOPY (EGD);  Surgeon: Jerene Bears, MD;  Location: [facility] ENDOSCOPY;  Service: Endoscopy;  Laterality: N/A;  . LAPAROTOMY N/A 05/19/2018   Procedure: EXPLORATORY LAPAROTOMY RIGHT COLECTOMY;  Surgeon: Fanny Skates, MD;  Location: [facility] ORS;  Service: General;  Laterality: N/A;  . LEFT HEART CATH AND CORONARY ANGIOGRAPHY N/A 02/14/2017   Procedure: Left Heart Cath and Coronary Angiography;  Surgeon: Belva Crome, MD;  Location: [facility];  Service: Cardiovascular;  Laterality: N/A;  . LUMBAR SPINE SURGERY  11/2008   Dr Joya Salm  . TEE WITHOUT CARDIOVERSION N/A 02/15/2017   Procedure: TRANSESOPHAGEAL ECHOCARDIOGRAM (TEE);  Surgeon: Grace Isaac, MD;  Location: [facility];  Service: Open Heart Surgery;  Laterality: N/A;    Social History:  Ambulatory   independently      reports that he quit smoking about 9 years ago. His smoking use included cigarettes. He has a 62.50 pack-year smoking history. He has quit using smokeless tobacco.  His smokeless tobacco use included chew. He reports that he drinks about 6.0 standard drinks of alcohol per week. He reports that he does not use  drugs.     Family History:   Family History  Problem Relation Age of Onset  . Cancer Mother        Brain Cancer  . Diabetes Father   . Heart disease Father        CAD  . Hypertension Father   . Stroke Father   . Diabetes Sister   . Stomach cancer Neg Hx   . Pancreatic cancer Neg Hx   . Colon cancer Neg Hx   . Esophageal cancer Neg Hx     Allergies: Allergies  Allergen Reactions  . No Known Allergies      Prior to Admission medications   Medication Sig Start Date End Date Taking? Authorizing Provider  acetaminophen (TYLENOL) 500 MG tablet Take 1 tablet (500 mg total) by mouth every 6 (six) hours as needed for mild pain. 05/30/18  Yes Rayburn, Floyce Stakes, PA-C  aspirin EC 81 MG EC tablet Take 1 tablet (81 mg total) by mouth daily. 02/25/17  Yes Harriet Pho, Tessa N, PA-C  docusate sodium (COLACE) 100 MG capsule Take 1 capsule (100 mg total) by mouth 2 (two) times daily. 05/30/18  Yes Rayburn, Claiborne Billings A, PA-C  furosemide (LASIX) 40 MG tablet Take 1 tablet (40 mg total) by mouth daily. 08/10/17 08/10/18 Yes Dorothy Spark, MD  gabapentin (NEURONTIN) 100 MG capsule Take 1 capsule (100 mg total) by mouth 2 (two) times daily. 05/30/18  Yes Rayburn, Claiborne Billings A, PA-C  metoprolol succinate (TOPROL XL) 50 MG 24 hr tablet Take 1 tablet (50 mg total) by mouth daily. Take with or immediately following a meal. 05/24/17  Yes Imogene Burn, PA-C  polyethylene glycol [facility]) packet Take 17 g by mouth daily. 05/30/18  Yes Rayburn, Claiborne Billings A, PA-C  pravastatin (PRAVACHOL) 40 MG tablet I po q hs Patient taking differently: Take 40 mg by mouth daily.  01/17/18  Yes Opalski, Deborah, DO  alum & mag hydroxide-simeth (MAALOX/MYLANTA) 200-200-20 MG/5ML suspension Take 30 mLs by mouth every 6 (six) hours as needed for indigestion (gas/bloating). Patient not taking: Reported on 06/02/2018 05/30/18   Rayburn, Claiborne Billings A, PA-C  ferrous sulfate 325 (65 FE) MG EC tablet Take 1 tablet (325 mg total) by mouth 3 (three) times  daily with meals. Patient not taking: Reported on 06/02/2018 01/17/18   [name], DO  traMADol (ULTRAM) 50 MG tablet Take 1 tablet (50 mg total) by mouth every 6 (six) hours as needed for moderate pain or severe pain. Patient not taking: Reported on 06/02/2018 05/30/18   Rayburn, Floyce Stakes, PA-C   Physical Exam: Blood pressure 127/77, pulse (!) 112, temperature 98.3 F (36.8 C), temperature source Oral, resp. rate (!) 32, SpO2 97 %. 1. General:  in No Acute distress  Chronically ill -appearing 2. Psychological: Alert and Oriented 3. Head/ENT:   Moist  Mucous Membranes                          Head Non traumatic, neck supple                           Poor Dentition 4. SKIN: normal  Skin turgor,  Skin clean Dry and intact no rash 5. Heart: Regular rate and rhythm no  Murmur, no Rub or gallop 6. Lungs:   no wheezes extensive crackles   7. Abdomen: Soft,  non-tender,  Distended  obese  bowel sounds present 8. Lower extremities: no clubbing, cyanosis, 2+edema 9. Neurologically Grossly intact, moving all 4 extremities equally   10. MSK: Normal range of motion   LABS:     Recent Labs  Lab 05/30/18 0406 06/02/18 1700  WBC 8.5 9.7  NEUTROABS  --  8.2*  HGB 11.4* 11.9*  HCT 35.1* 36.9*  MCV 89.1 89.1  PLT 286 175*   Basic Metabolic Panel: Recent Labs  Lab 05/30/18 0406 06/02/18 1700  NA 139 141  K 3.1* 2.7*  CL 101 95*  CO2 31 31  GLUCOSE 128* 121*  BUN 9 12  CREATININE 0.89 1.01  CALCIUM 8.0* 8.3*  MG 1.7  --       Recent Labs  Lab 06/02/18 1700  AST 47*  ALT 34  ALKPHOS 121  BILITOT 0.5  PROT 5.7*  ALBUMIN 2.3*   Recent Labs  Lab 06/02/18 1801  LIPASE 103*   No results for input(s): AMMONIA in the last 168 hours.    HbA1C: No results for input(s): HGBA1C in the last 72 hours. CBG: No results for input(s): GLUCAP in the last 168 hours.    Urine analysis:    Component Value Date/Time   COLORURINE STRAW (A) 02/14/2017 2049   APPEARANCEUR CLEAR  02/14/2017 2049   LABSPEC 1.006 02/14/2017 2049   PHURINE 6.0 02/14/2017 2049  GLUCOSEU NEGATIVE 02/14/2017 2049   GLUCOSEU NEGATIVE 08/11/2016 1521   [place] 02/14/2017 2049   BILIRUBINUR NEGATIVE 02/14/2017 2049   [place] 02/14/2017 2049   PROTEINUR NEGATIVE 02/14/2017 2049   UROBILINOGEN 1.0 08/11/2016 1521   NITRITE NEGATIVE 02/14/2017 2049   LEUKOCYTESUR NEGATIVE 02/14/2017 2049       Cultures: No results found for: SDES, SPECREQUEST, CULT, REPTSTATUS   Radiological Exams on Admission: Dg Chest 2 View  Result Date: 06/02/2018 CLINICAL DATA:  84 y/o M; laparoscopic partial colectomy 05/19/2018. Shortness of breath and congestion. EXAM: CHEST - 2 VIEW COMPARISON:  05/22/2018 chest radiograph FINDINGS: Stable normal cardiac silhouette given projection and technique. Post median sternotomy with wires in alignment. Aortic atherosclerosis with calcification. Clear lungs. No pleural effusion or pneumothorax. Pneumoperitoneum. No acute osseous abnormality is evident. IMPRESSION: 1. Pneumoperitoneum. 2. No acute pulmonary process identified. These results were called by telephone at the time of interpretation on 06/02/2018 at 5:46 pm to Dr. Vallery Ridge, who verbally acknowledged these results. Electronically Signed   By: Kristine Garbe M.D.   On: 06/02/2018 17:48    Chart has been reviewed    Code Status:  FULL CODE                       Chinelo Benn 06/03/2018, 12:00 AM     [facility]  Pager [phone]   after 2 AM please page floor coverage PA If 7AM-7PM, please contact the day team taking care of the patient  [email or web address]  Password [email or web address]

## 2018-06-02 NOTE — ED Provider Notes (Addendum)
[facility] DEPT Provider Note   CSN: [id number] Arrival date & time: 06/02/18  1617     History   Chief Complaint Chief Complaint  Patient presents with  . Cough  . Shortness of Breath    HPI Vincent Mcclure is a 84 y.o. male.  HPI Patient is referred from [facility] surgery office today.  Patient status post right colectomy with primary anastomosis [phone].  This was done for colon cancer.  Patient was discharged home 8\20.  He continues to be very weak.  Patient's wife reports that she is not able to assist him for any kind of transfers.  He cannot get up by himself.  He has an increasingly wet cough.  No measured fever.  He denies chest pain.  He reports some abdominal discomfort but not severe.  He has a dehisced surgical wound that needs wet-to-dry packings.  Patient's wife reports that the wound is deep and she does not feel confident doing the wet-to-dry dressings.  Patient reports persistent discomfort and clogged feeling in the left nare\posterior nasal oropharynx where he had an NG tube.  He reports pressure in the ear but has not had any actual drainage or discharge. Past Medical History:  Diagnosis Date  . Alcohol abuse   . Anemia   . Chronic low back pain 10/14/2011  . Colon polyps    hyperplastic (2004, 2010) and adenomatous (1990).    . COLONIC POLYPS, HX OF 03/05/2008  . Esophageal stricture    hx of  . Gastric ulcer   . GERD 03/05/2008  . GERD (gastroesophageal reflux disease) 1994   associated peptic strictures  . HYPERLIPIDEMIA 03/05/2008  . HYPERTENSION 03/05/2008  . Hypertension   . IBS 03/05/2008  . Impaired glucose tolerance 10/12/2011  . Iron deficiency anemia   . LEG CRAMPS 09/02/2010  . PAD (peripheral artery disease) ([place]) 10/14/2011  . PAD (peripheral artery disease) ([place]) 2013  . PERIPHERAL EDEMA 09/02/2010  . PERIPHERAL NEUROPATHY 03/05/2008  . Prostatitis    hx of  . VARICOSE VEINS, LOWER EXTREMITIES 06/09/2009      Patient Active Problem List   Diagnosis Date Noted  . Cancer of right colon ([place]) 05/24/2018  . Colonic mass 05/19/2018  . Colonic obstruction ([place]) 05/18/2018  . CAD (coronary artery disease), native coronary artery 03/14/2017  . PAF (paroxysmal atrial fibrillation) ([place])   . 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 s/p NSTEMI (non-ST elevated myocardial infarction); with subsequent urgent CABG  5/18 ([place]) 02/12/2017  . Chest pain 02/11/2017  . Overweight (BMI 25.0-29.9) 09/29/2016  . Alcohol use/ h/o heavy abuse 08/11/2016  . Left shoulder pain 01/24/2015  . GI bleed 11/14/2014  . h/o Anemia due to blood loss, acute 11/14/2014  . Eye pain   . Acute esophagitis   . Acute gastric ulcer   . Duodenal ulcer disease   . Upper GI bleed 11/13/2014  . Lumbar disc disease 01/30/2014  . Hearing loss 03/23/2013  . Screening for other and unspecified cardiovascular conditions 03/23/2013  . PAD (peripheral artery disease) ([place]) 10/14/2011  . Chronic low back pain 10/14/2011  . Glucose intolerance (impaired glucose tolerance) 10/12/2011  . Preventative health care 10/12/2011  . LEG CRAMPS 09/02/2010  . PERIPHERAL EDEMA 09/02/2010  . VARICOSE VEINS, LOWER EXTREMITIES 06/09/2009  . HLD (hyperlipidemia) 03/05/2008  . Anemia, iron deficiency 03/05/2008  . Hereditary and idiopathic peripheral neuropathy 03/05/2008  . HTN (hypertension) 03/05/2008  . GERD 03/05/2008  . IBS 03/05/2008  .  Blood in stool 03/05/2008  . COLONIC POLYPS, HX OF 03/05/2008    Past Surgical History:  Procedure Laterality Date  . CATARACT EXTRACTION  bilat  . CORONARY ARTERY BYPASS GRAFT N/A 02/15/2017   Procedure: CORONARY ARTERY BYPASS GRAFTING times three  with left internal mammary harvest and endoscopic harvest of Right SVG. Grafts of LIMA to  LAD, SVG to Distal Circ, and to First Diag.;  Surgeon: Grace Isaac, MD;  Location: [facility];  Service: Open Heart Surgery;  Laterality: N/A;  . ESOPHAGOGASTRODUODENOSCOPY N/A  11/14/2014   Procedure: ESOPHAGOGASTRODUODENOSCOPY (EGD);  Surgeon: Jerene Bears, MD;  Location: [facility] ENDOSCOPY;  Service: Endoscopy;  Laterality: N/A;  . LAPAROTOMY N/A 05/19/2018   Procedure: EXPLORATORY LAPAROTOMY RIGHT COLECTOMY;  Surgeon: Fanny Skates, MD;  Location: [facility] ORS;  Service: General;  Laterality: N/A;  . LEFT HEART CATH AND CORONARY ANGIOGRAPHY N/A 02/14/2017   Procedure: Left Heart Cath and Coronary Angiography;  Surgeon: Belva Crome, MD;  Location: [facility] CV LAB;  Service: Cardiovascular;  Laterality: N/A;  . LUMBAR SPINE SURGERY  11/2008   Dr Joya Salm  . TEE WITHOUT CARDIOVERSION N/A 02/15/2017   Procedure: TRANSESOPHAGEAL ECHOCARDIOGRAM (TEE);  Surgeon: Grace Isaac, MD;  Location: [facility];  Service: Open Heart Surgery;  Laterality: N/A;        Home Medications    Prior to Admission medications   Medication Sig Start Date End Date Taking? Authorizing Provider  acetaminophen (TYLENOL) 500 MG tablet Take 1 tablet (500 mg total) by mouth every 6 (six) hours as needed for mild pain. 05/30/18  Yes Rayburn, Floyce Stakes, PA-C  aspirin EC 81 MG EC tablet Take 1 tablet (81 mg total) by mouth daily. 02/25/17  Yes Harriet Pho, Tessa N, PA-C  docusate sodium (COLACE) 100 MG capsule Take 1 capsule (100 mg total) by mouth 2 (two) times daily. 05/30/18  Yes Rayburn, Claiborne Billings A, PA-C  furosemide (LASIX) 40 MG tablet Take 1 tablet (40 mg total) by mouth daily. 08/10/17 08/10/18 Yes Dorothy Spark, MD  gabapentin (NEURONTIN) 100 MG capsule Take 1 capsule (100 mg total) by mouth 2 (two) times daily. 05/30/18  Yes Rayburn, Claiborne Billings A, PA-C  metoprolol succinate (TOPROL XL) 50 MG 24 hr tablet Take 1 tablet (50 mg total) by mouth daily. Take with or immediately following a meal. 05/24/17  Yes Imogene Burn, PA-C  polyethylene glycol [facility]) packet Take 17 g by mouth daily. 05/30/18  Yes Rayburn, Claiborne Billings A, PA-C  pravastatin (PRAVACHOL) 40 MG tablet I po q hs Patient taking differently: Take 40 mg by mouth  daily.  01/17/18  Yes Opalski, Deborah, DO  alum & mag hydroxide-simeth (MAALOX/MYLANTA) 200-200-20 MG/5ML suspension Take 30 mLs by mouth every 6 (six) hours as needed for indigestion (gas/bloating). Patient not taking: Reported on 06/02/2018 05/30/18   Rayburn, Claiborne Billings A, PA-C  ferrous sulfate 325 (65 FE) MG EC tablet Take 1 tablet (325 mg total) by mouth 3 (three) times daily with meals. Patient not taking: Reported on 06/02/2018 01/17/18   [name], DO  traMADol (ULTRAM) 50 MG tablet Take 1 tablet (50 mg total) by mouth every 6 (six) hours as needed for moderate pain or severe pain. Patient not taking: Reported on 06/02/2018 05/30/18   Rayburn, Floyce Stakes, PA-C    Family History Family History  Problem Relation Age of Onset  . Cancer Mother        Brain Cancer  . Diabetes Father   . Heart disease Father  CAD  . Hypertension Father   . Stroke Father   . Diabetes Sister   . Stomach cancer Neg Hx   . Pancreatic cancer Neg Hx   . Colon cancer Neg Hx   . Esophageal cancer Neg Hx     Social History Social History   Tobacco Use  . Smoking status: Former Smoker    Packs/day: 1.25    Years: 50.00    Pack years: 62.50    Types: Cigarettes    Last attempt to quit: 11/11/2008    Years since quitting: 9.5  . Smokeless tobacco: Former Systems developer    Types: Chew  Substance Use Topics  . Alcohol use: Yes    Alcohol/week: 6.0 standard drinks    Types: 2 Glasses of wine, 4 Cans of beer per week  . Drug use: No     Allergies   No known allergies   Review of Systems Review of Systems 10 Systems reviewed and are negative for acute change except as noted in the HPI.   Physical Exam Updated Vital Signs BP 127/77   Pulse (!) 112   Temp 98.3 F (36.8 C) (Oral)   Resp (!) 32   SpO2 97%   Physical Exam  Constitutional: He is oriented to person, place, and time.  Patient is alert and nontoxic.  He has mild tachypnea but is not exhibiting respiratory distress.  He has very wet,  gurgly sounding cough.  Mental status is interactive and pleasant.  HENT:  Head: Normocephalic and atraumatic.  Left TM normal.  Oral cavity widely patent.  No obvious postnasal drip, clot or bleeding from the post anterior oropharynx.  Eyes: EOM are normal.  Cardiovascular:  Irregularly irregular.  Borderline tachycardia.  Pulmonary/Chest:  Tachypnea.  Patient is not respiratory distress.  He does have extremely wet, gurgling sounding cough.  Rhonchi both lung bases.  Abdominal:  Abdomen is mildly distended.  No guarding.  Patient does not endorse significant tenderness to palpation to the lateral abdomen both sides of his central incision.  Incision is healing above the umbilicus but below the umbilicus he has approximately 7 cm of dehiscence which is deep to the abdominal wall.  Depth is approximately 5 cm by visual inspection.  Musculoskeletal:  2+ pitting edema bilateral legs and feet.  No erythema or cellulitis of the legs or feet  Neurological: He is alert and oriented to person, place, and time. He exhibits normal muscle tone. Coordination normal.  Patient can assist in sitting forward and moving.  He is however generally very deconditioned and weak.  Skin: Skin is warm and dry. There is pallor.  Psychiatric: He has a normal mood and affect.     ED Treatments / Results  Labs (all labs ordered are listed, but only abnormal results are displayed) Labs Reviewed  COMPREHENSIVE METABOLIC PANEL - Abnormal; Notable for the following components:      Result Value   Potassium 2.7 (*)    Chloride 95 (*)    Glucose, Bld 121 (*)    Calcium 8.3 (*)    Total Protein 5.7 (*)    Albumin 2.3 (*)    AST 47 (*)    All other components within normal limits  CBC WITH DIFFERENTIAL/PLATELET - Abnormal; Notable for the following components:   RBC 4.14 (*)    Hemoglobin 11.9 (*)    HCT 36.9 (*)    Platelets 425 (*)    Neutro Abs 8.2 (*)    Lymphs Abs 0.6 (*)  All other components within  normal limits  LIPASE, BLOOD - Abnormal; Notable for the following components:   Lipase 103 (*)    All other components within normal limits  BRAIN NATRIURETIC PEPTIDE - Abnormal; Notable for the following components:   B Natriuretic Peptide 165.5 (*)    All other components within normal limits  URINE CULTURE  CULTURE, BLOOD (ROUTINE X 2)  CULTURE, BLOOD (ROUTINE X 2)  PROTIME-INR  URINALYSIS, ROUTINE W REFLEX MICROSCOPIC  I-STAT CG4 LACTIC ACID, ED  I-STAT TROPONIN, ED  CG4 I-STAT (LACTIC ACID)  POCT I-STAT TROPONIN I  I-STAT CG4 LACTIC ACID, ED    EKG EKG Interpretation  Date/Time:  Friday June 02 2018 18:07:03 EDT Ventricular Rate:  109 PR Interval:    QRS Duration: 73 QT Interval:  287 QTC Calculation: 369 R Axis:   44 Text Interpretation:  Atrial fibrillation Paired ventricular premature complexes Low voltage, precordial leads Nonspecific repol abnormality, diffuse leads agree. Confirmed by Charlesetta Shanks [phone]) on 06/02/2018 9:31:00 PM   Radiology Dg Chest 2 View  Result Date: 06/02/2018 CLINICAL DATA:  84 y/o M; laparoscopic partial colectomy 05/19/2018. Shortness of breath and congestion. EXAM: CHEST - 2 VIEW COMPARISON:  05/22/2018 chest radiograph FINDINGS: Stable normal cardiac silhouette given projection and technique. Post median sternotomy with wires in alignment. Aortic atherosclerosis with calcification. Clear lungs. No pleural effusion or pneumothorax. Pneumoperitoneum. No acute osseous abnormality is evident. IMPRESSION: 1. Pneumoperitoneum. 2. No acute pulmonary process identified. These results were called by telephone at the time of interpretation on 06/02/2018 at 5:46 pm to Dr. Vallery Ridge, who verbally acknowledged these results. Electronically Signed   By: Kristine Garbe M.D.   On: 06/02/2018 17:48    Procedures Procedures (including critical care time)  Medications Ordered in ED Medications  potassium chloride 10 mEq in 100 mL IVPB (10 mEq  Intravenous New Bag/Given 06/02/18 2052)  potassium chloride SA (K-DUR,KLOR-CON) CR tablet 40 mEq (40 mEq Oral Given 06/02/18 2039)     Initial Impression / Assessment and Plan / ED Course  I have reviewed the triage vital signs and the nursing notes.  Pertinent labs & imaging results that were available during my care of the patient were reviewed by me and considered in my medical decision making (see chart for details).    Consult: Dr. Harlow Asa general surgery has consulted in the ED.   Final Clinical Impressions(s) / ED Diagnoses   Final diagnoses:  Hypokalemia  Peripheral edema  Post-operative state  Dehiscence of operative wound, sequela  General weakness  Paroxysmal atrial fibrillation [facility])  Patient has multifactorial medical conditions contributing to his presentation today.  He has extensive past medical history.  He is postoperative for newly diagnosed colon cancer with wound dehiscence and declining condition at home subsequent to his discharge 3 days ago.  Patient is wife is having difficulty managing his wound with wet-to-dry dressings.  Patient is also developed increasing wet cough.  Chest x-ray does not show focal pneumonia or volume overload but patient does have mild elevation in his BNP and peripheral edema despite taking Lasix at home.  He has also become hypokalemic.  He has history of paroxysmal atrial fibrillation.  Patient was not a candidate for anticoagulation due to medical comorbidities.  Plan will be for admission.  ED Discharge Orders    None       Charlesetta Shanks, MD 06/02/18 2138    Charlesetta Shanks, MD 06/02/18 2139

## 2018-06-02 NOTE — ED Triage Notes (Signed)
Pt sent by GI surgeon d/t possible pneumonia. Pt recently had surgery to remove part of his colon that was cancerous.

## 2018-06-02 NOTE — ED Notes (Signed)
ED TO INPATIENT HANDOFF REPORT  Name/Age/Gender Vincent Mcclure 84 y.o. male  Code Status    Code Status Orders  (From admission, onward)         Start     Ordered   06/02/18 2139  Full code  Continuous     06/02/18 2143        Code Status History    Date Active Date Inactive Code Status Order ID Comments User Context   05/19/2018 0216 05/30/2018 1834 Full Code [id number]  Etta Quill, DO Inpatient   02/15/2017 1311 02/25/2017 1942 Full Code [id number]  Miguel Aschoff Inpatient   02/11/2017 1932 02/15/2017 1311 Full Code [id number]  Ivor Costa, MD ED   11/14/2014 0020 11/15/2014 1652 Full Code [id number]  Theressa Millard, MD ED      Home/SNF/Other Home  Chief Complaint Possible pneumonia, sent by dr  Level of Care/Admitting Diagnosis ED Disposition    ED Disposition Condition [place]: [facility] [100102]  Level of Care: Telemetry [5]  Admit to tele based on following criteria: Acute CHF  Diagnosis: Congestive heart failure (CHF) [facility]) [500370]  Admitting Physician: McCleary, MD [[id number]]  Attending Physician: CCS, MD [[id number]]  Bed request comments: telemetry  PT Class (Do Not Modify): Observation [104]  PT Acc Code (Do Not Modify): Observation [10022]       Medical History Past Medical History:  Diagnosis Date  . Alcohol abuse   . Anemia   . Chronic low back pain 10/14/2011  . Colon polyps    hyperplastic (2004, 2010) and adenomatous (1990).    . COLONIC POLYPS, HX OF 03/05/2008  . Esophageal stricture    hx of  . Gastric ulcer   . GERD 03/05/2008  . GERD (gastroesophageal reflux disease) 1994   associated peptic strictures  . HYPERLIPIDEMIA 03/05/2008  . HYPERTENSION 03/05/2008  . Hypertension   . IBS 03/05/2008  . Impaired glucose tolerance 10/12/2011  . Iron deficiency anemia   . LEG CRAMPS 09/02/2010  . PAD (peripheral artery disease) ([place]) 10/14/2011  . PAD (peripheral artery disease) ([place]) 2013  . PERIPHERAL EDEMA  09/02/2010  . PERIPHERAL NEUROPATHY 03/05/2008  . Prostatitis    hx of  . VARICOSE VEINS, LOWER EXTREMITIES 06/09/2009    Allergies Allergies  Allergen Reactions  . No Known Allergies     IV Location/Drains/Wounds Patient Lines/Drains/Airways Status   Active Line/Drains/Airways    Name:   Placement date:   Placement time:   Site:   Days:   Peripheral IV 06/02/18 Left Antecubital   06/02/18    1821    Antecubital   less than 1   Incision (Closed) 02/15/17 Leg Right   02/15/17    0920     472   Incision (Closed) 05/19/18 Abdomen   05/19/18    1235     14          Labs/Imaging Results for orders placed or performed during the hospital encounter of 06/02/18 (from the past 48 hour(s))  Comprehensive metabolic panel     Status: Abnormal   Collection Time: 06/02/18  5:00 PM  Result Value Ref Range   Sodium 141 135 - 145 mmol/L   Potassium 2.7 (LL) 3.5 - 5.1 mmol/L    Comment: CRITICAL RESULT CALLED TO, READ BACK BY AND VERIFIED WITH: Erasmo Score [id number] @ 1909 BY J SCOTTON    Chloride 95 (L) 98 - 111 mmol/L   CO2 31  22 - 32 mmol/L   Glucose, Bld 121 (H) 70 - 99 mg/dL   BUN 12 8 - 23 mg/dL   Creatinine, Ser 1.01 0.61 - 1.24 mg/dL   Calcium 8.3 (L) 8.9 - 10.3 mg/dL   Total Protein 5.7 (L) 6.5 - 8.1 g/dL   Albumin 2.3 (L) 3.5 - 5.0 g/dL   AST 47 (H) 15 - 41 U/L   ALT 34 0 - 44 U/L   Alkaline Phosphatase 121 38 - 126 U/L   Total Bilirubin 0.5 0.3 - 1.2 mg/dL   GFR calc non Af Amer >60 >60 mL/min   GFR calc Af Amer >60 >60 mL/min    Comment: (NOTE) The eGFR has been calculated using the CKD EPI equation. This calculation has not been validated in all clinical situations. eGFR's persistently <60 mL/min signify possible Chronic Kidney Disease.    Anion gap 15 5 - 15    Comment: Performed at [facility], [place] [address], [place], [place] [address]  CBC with Differential     Status: Abnormal   Collection Time: 06/02/18  5:00 PM  Result Value Ref Range    WBC 9.7 4.0 - 10.5 K/uL   RBC 4.14 (L) 4.22 - 5.81 MIL/uL   Hemoglobin 11.9 (L) 13.0 - 17.0 g/dL   HCT 36.9 (L) 39.0 - 52.0 %   MCV 89.1 78.0 - 100.0 fL   MCH 28.7 26.0 - 34.0 pg   MCHC 32.2 30.0 - 36.0 g/dL   RDW 14.2 11.5 - 15.5 %   Platelets 425 (H) 150 - 400 K/uL   Neutrophils Relative % 84 %   Neutro Abs 8.2 (H) 1.7 - 7.7 K/uL   Lymphocytes Relative 6 %   Lymphs Abs 0.6 (L) 0.7 - 4.0 K/uL   Monocytes Relative 10 %   Monocytes Absolute 0.9 0.1 - 1.0 K/uL   Eosinophils Relative 0 %   Eosinophils Absolute 0.0 0.0 - 0.7 K/uL   Basophils Relative 0 %   Basophils Absolute 0.0 0.0 - 0.1 K/uL    Comment: Performed at [facility], [place] [address], [place], [place] [address]  Lipase, blood     Status: Abnormal   Collection Time: 06/02/18  6:01 PM  Result Value Ref Range   Lipase 103 (H) 11 - 51 U/L    Comment: Performed at [facility], [place] [address], [place], [place] [address]  Protime-INR     Status: None   Collection Time: 06/02/18  6:01 PM  Result Value Ref Range   Prothrombin Time 14.6 11.4 - 15.2 seconds   INR 1.15     Comment: Performed at [facility], [place] [address] [place], [place] [address]  Brain natriuretic peptide     Status: Abnormal   Collection Time: 06/02/18  6:01 PM  Result Value Ref Range   B Natriuretic Peptide 165.5 (H) 0.0 - 100.0 pg/mL    Comment: Performed at [facility], [place] [address], [place], [place] [address]  POCT i-Stat troponin I     Status: None   Collection Time: 06/02/18  6:14 PM  Result Value Ref Range   Troponin i, poc 0.03 0.00 - 0.08 ng/mL   Comment 3            Comment: Due to the release kinetics of cTnI, a negative result within the first hours of the onset of symptoms does not rule out myocardial infarction with certainty. If myocardial infarction is  still suspected, repeat the test at appropriate intervals.   CG4 I-STAT (Lactic acid)     Status:  None   Collection Time: 06/02/18  6:16 PM  Result Value Ref Range   Lactic Acid, Venous 1.50 0.5 - 1.9 mmol/L   Dg Chest 2 View  Result Date: 06/02/2018 CLINICAL DATA:  84 y/o M; laparoscopic partial colectomy 05/19/2018. Shortness of breath and congestion. EXAM: CHEST - 2 VIEW COMPARISON:  05/22/2018 chest radiograph FINDINGS: Stable normal cardiac silhouette given projection and technique. Post median sternotomy with wires in alignment. Aortic atherosclerosis with calcification. Clear lungs. No pleural effusion or pneumothorax. Pneumoperitoneum. No acute osseous abnormality is evident. IMPRESSION: 1. Pneumoperitoneum. 2. No acute pulmonary process identified. These results were called by telephone at the time of interpretation on 06/02/2018 at 5:46 pm to Dr. Vallery Ridge, who verbally acknowledged these results. Electronically Signed   By: Kristine Garbe M.D.   On: 06/02/2018 17:48    Pending Labs Unresulted Labs (From admission, onward)    Start     Ordered   06/09/18 0500  Creatinine, serum  (enoxaparin (LOVENOX)    CrCl >/= 30 ml/min)  Weekly,   R    Comments:  while on enoxaparin therapy    06/02/18 2143   06/03/18 9233  Basic metabolic panel  Tomorrow morning,   R     06/02/18 2144   06/02/18 2139  CBC  (enoxaparin (LOVENOX)    CrCl >/= 30 ml/min)  Once,   R    Comments:  Baseline for enoxaparin therapy IF NOT ALREADY DRAWN.  Notify MD if PLT < 100 K.    06/02/18 2143   06/02/18 2139  Creatinine, serum  (enoxaparin (LOVENOX)    CrCl >/= 30 ml/min)  Once,   R    Comments:  Baseline for enoxaparin therapy IF NOT ALREADY DRAWN.    06/02/18 2143   06/02/18 1801  Urinalysis, Routine w reflex microscopic  STAT,   STAT     06/02/18 1801   06/02/18 1801  Urine culture  STAT,   STAT     06/02/18 1801   06/02/18 1801  Culture, blood (routine x 2)  BLOOD CULTURE X 2,   STAT     06/02/18 1801   Signed and Held  Basic metabolic panel  Daily,   R     Signed and Held   Signed and Held   Troponin I  Now then every 6 hours,   R     Signed and Held   Signed and Held  TSH  Tomorrow morning,   R     Signed and Held          Vitals/Pain Today's Vitals   06/02/18 1830 06/02/18 1845 06/02/18 1900 06/02/18 2252  BP: 120/74  127/77 132/84  Pulse: (!) 102 (!) 128 (!) 112 (!) 106  Resp: (!) 40 (!) 35 (!) 32 16  Temp:      TempSrc:      SpO2: 98% 97% 97% 96%  PainSc:        Isolation Precautions No active isolations  Medications Medications  enoxaparin (LOVENOX) injection 40 mg (has no administration in time range)  dextrose 5 % and 0.45 % NaCl with KCl 40 mEq/L infusion (has no administration in time range)  ondansetron (ZOFRAN-ODT) disintegrating tablet 4 mg (has no administration in time range)    Or  ondansetron (ZOFRAN) injection 4 mg (has no administration in time range)  famotidine (PEPCID) IVPB 20 mg premix (  has no administration in time range)  potassium chloride 10 mEq in 100 mL IVPB (has no administration in time range)  furosemide (LASIX) 10 MG/ML injection (has no administration in time range)  potassium chloride 10 mEq in 100 mL IVPB (0 mEq Intravenous Stopped 06/02/18 2248)  potassium chloride SA (K-DUR,KLOR-CON) CR tablet 40 mEq (40 mEq Oral Given 06/02/18 2039)  furosemide (LASIX) injection 40 mg (40 mg Intravenous Given 06/02/18 2214)    Mobility walks with device

## 2018-06-03 ENCOUNTER — Other Ambulatory Visit: Payer: Self-pay

## 2018-06-03 ENCOUNTER — Observation Stay (HOSPITAL_BASED_OUTPATIENT_CLINIC_OR_DEPARTMENT_OTHER): Payer: Medicare HMO

## 2018-06-03 DIAGNOSIS — E876 Hypokalemia: Secondary | ICD-10-CM | POA: Diagnosis not present

## 2018-06-03 DIAGNOSIS — I5030 Unspecified diastolic (congestive) heart failure: Secondary | ICD-10-CM | POA: Diagnosis not present

## 2018-06-03 DIAGNOSIS — I4891 Unspecified atrial fibrillation: Secondary | ICD-10-CM | POA: Diagnosis not present

## 2018-06-03 DIAGNOSIS — I5033 Acute on chronic diastolic (congestive) heart failure: Secondary | ICD-10-CM

## 2018-06-03 DIAGNOSIS — I5020 Unspecified systolic (congestive) heart failure: Secondary | ICD-10-CM | POA: Diagnosis not present

## 2018-06-03 DIAGNOSIS — E8809 Other disorders of plasma-protein metabolism, not elsewhere classified: Secondary | ICD-10-CM | POA: Diagnosis present

## 2018-06-03 LAB — URINALYSIS, MICROSCOPIC (REFLEX)

## 2018-06-03 LAB — PHOSPHORUS: PHOSPHORUS: 3.3 mg/dL (ref 2.5–4.6)

## 2018-06-03 LAB — MAGNESIUM
MAGNESIUM: 1.8 mg/dL (ref 1.7–2.4)
Magnesium: 1.7 mg/dL (ref 1.7–2.4)

## 2018-06-03 LAB — BASIC METABOLIC PANEL
Anion gap: 11 (ref 5–15)
Anion gap: 12 (ref 5–15)
BUN: 12 mg/dL (ref 8–23)
BUN: 11 mg/dL (ref 8–23)
CALCIUM: 8 mg/dL — AB (ref 8.9–10.3)
CHLORIDE: 98 mmol/L (ref 98–111)
CO2: 33 mmol/L — ABNORMAL HIGH (ref 22–32)
CO2: 31 mmol/L (ref 22–32)
Calcium: 8.3 mg/dL — ABNORMAL LOW (ref 8.9–10.3)
Chloride: 97 mmol/L — ABNORMAL LOW (ref 98–111)
Creatinine, Ser: 0.96 mg/dL (ref 0.61–1.24)
Creatinine, Ser: 0.95 mg/dL (ref 0.61–1.24)
GFR calc Af Amer: >60 mL/min (ref >60)
GFR calc Af Amer: >60 mL/min (ref >60)
GFR calc non Af Amer: >60 mL/min (ref >60)
GLUCOSE: 123 mg/dL — AB (ref 70–99)
GLUCOSE: 152 mg/dL — AB (ref 70–99)
POTASSIUM: 2.9 mmol/L — AB (ref 3.5–5.1)
Potassium: 2.7 mmol/L — CL (ref 3.5–5.1)
Sodium: 141 mmol/L (ref 135–145)
Sodium: 141 mmol/L (ref 135–145)

## 2018-06-03 LAB — URINALYSIS, ROUTINE W REFLEX MICROSCOPIC
BILIRUBIN URINE: NEGATIVE
GLUCOSE, UA: NEGATIVE mg/dL
KETONES UR: NEGATIVE mg/dL
Nitrite: NEGATIVE
PH: 6.5 (ref 5.0–8.0)
Protein, ur: NEGATIVE mg/dL
Specific Gravity, Urine: 1.01 (ref 1.005–1.030)

## 2018-06-03 LAB — CBC
HCT: 35 % — ABNORMAL LOW (ref 39.0–52.0)
Hemoglobin: 11 g/dL — ABNORMAL LOW (ref 13.0–17.0)
MCH: 28 pg (ref 26.0–34.0)
MCHC: 31.4 g/dL (ref 30.0–36.0)
MCV: 89.1 fL (ref 78.0–100.0)
PLATELETS: 383 10*3/uL (ref 150–400)
RBC: 3.93 MIL/uL — AB (ref 4.22–5.81)
RDW: 14.2 % (ref 11.5–15.5)
WBC: 9.3 10*3/uL (ref 4.0–10.5)

## 2018-06-03 LAB — CREATININE, SERUM
CREATININE: 0.93 mg/dL (ref 0.61–1.24)
GFR calc non Af Amer: >60 mL/min (ref >60)

## 2018-06-03 LAB — ECHOCARDIOGRAM COMPLETE
Height: 71 in
Weight: 3746.06 oz

## 2018-06-03 LAB — TROPONIN I
Troponin I: 0.03 ng/mL (ref <0.03)
Troponin I: 0.03 ng/mL (ref <0.03)

## 2018-06-03 LAB — TSH: TSH: 0.407 u[IU]/mL (ref 0.350–4.500)

## 2018-06-03 LAB — CK: Total CK: 23 U/L — ABNORMAL LOW (ref 49–397)

## 2018-06-03 LAB — PREALBUMIN: Prealbumin: <5 mg/dL — ABNORMAL LOW (ref 18–38)

## 2018-06-03 IMAGING — CR DG CHEST 1V PORT
1 series · 1 of 1 positions shown · non-contrast
Comparison: 02/15/2017

CLINICAL DATA: Status post coronary bypass graft

EXAM:
PORTABLE CHEST 1 VIEW

[AP]
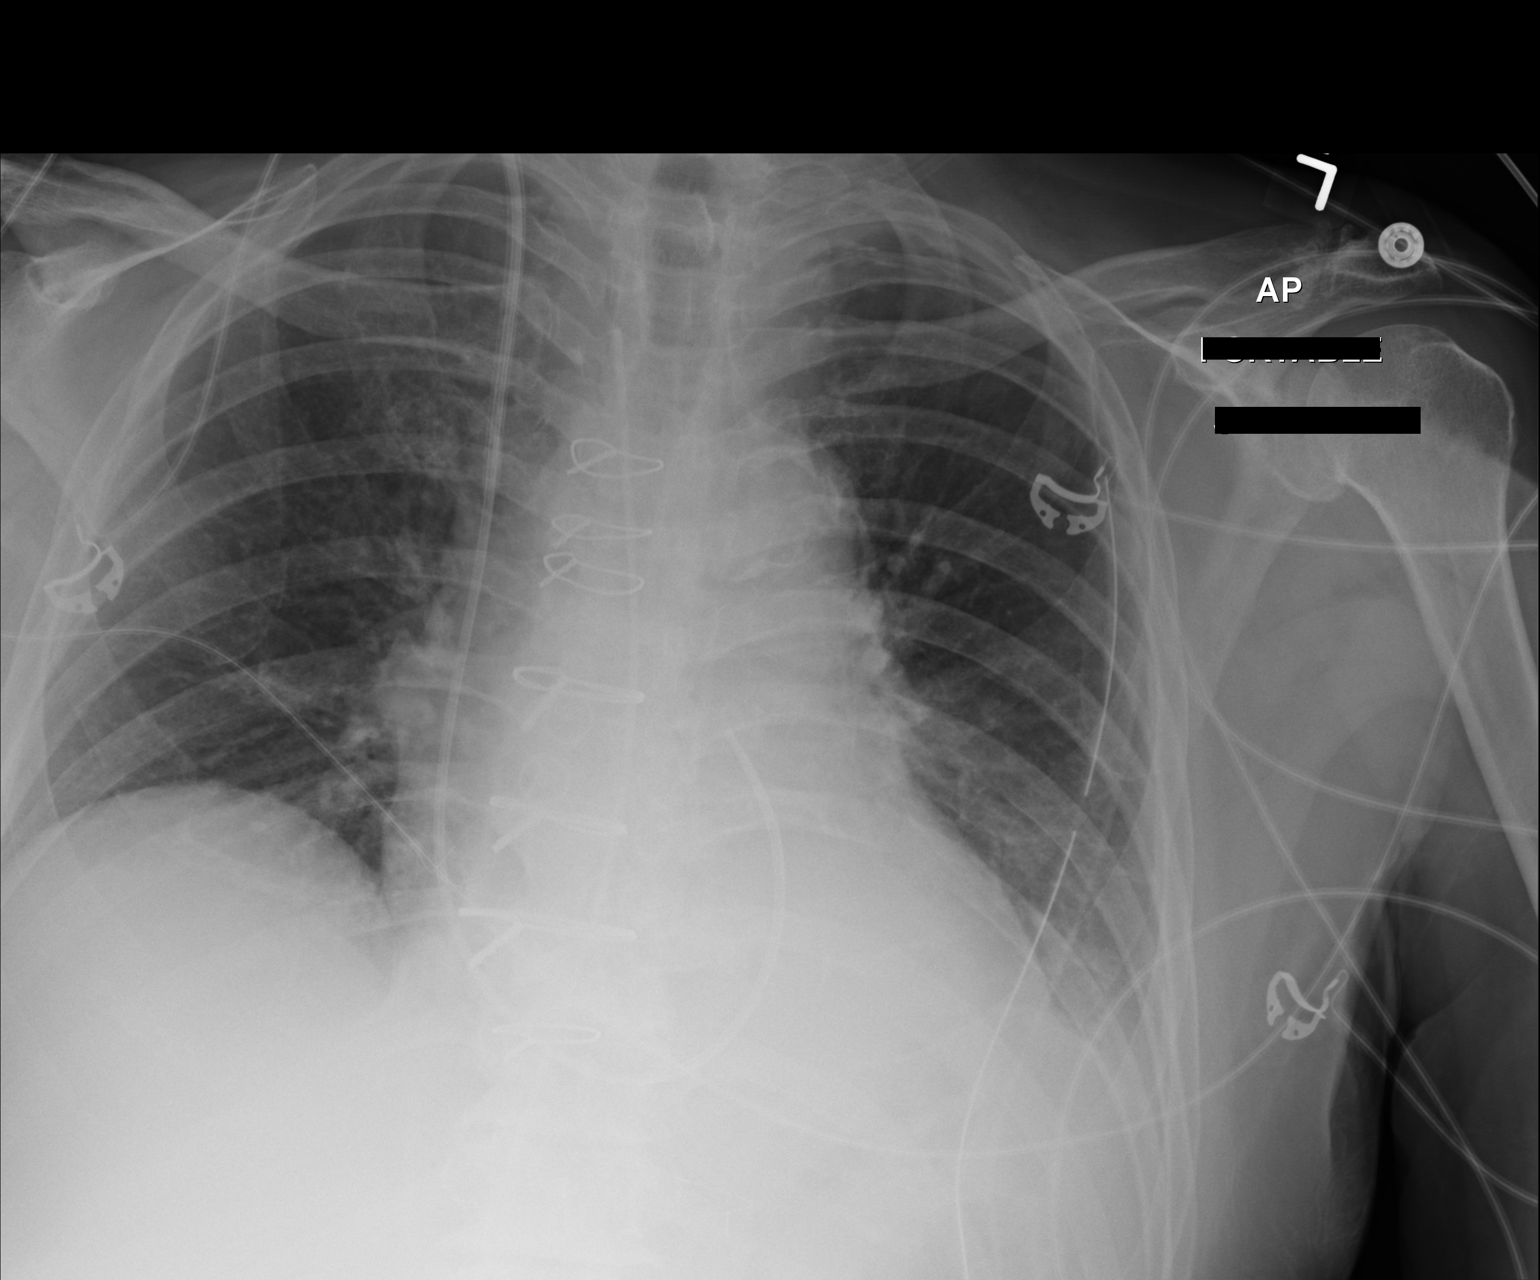

[1 of 1 positions shown; findings below may reference images not displayed]

FINDINGS: Cardiac shadow is stable. Postsurgical changes are again seen.
Swan-Ganz catheter, mediastinal drain and left thoracostomy catheter
are again seen and stable. Endotracheal tube and nasogastric
catheter have been removed. The lungs are well aerated with very
minimal left basilar atelectasis. No pneumothorax is seen.
IMPRESSION: Minimal left basilar atelectasis.

Tubes and lines as described.

## 2018-06-03 MED ORDER — ENALAPRIL MALEATE 2.5 MG PO TABS
2.5000 mg | ORAL_TABLET | Freq: Two times a day (BID) | ORAL | Status: DC
  Administered 2018-06-03 – 2018-06-10 (×12): 2.5 mg via ORAL
  Filled 2018-06-03 (×17): qty 1

## 2018-06-03 MED ORDER — POTASSIUM CHLORIDE 10 MEQ/100ML IV SOLN
10.0000 meq | INTRAVENOUS | Status: AC
  Administered 2018-06-03 (×4): 10 meq via INTRAVENOUS
  Filled 2018-06-03 (×4): qty 100

## 2018-06-03 MED ORDER — METOPROLOL SUCCINATE ER 50 MG PO TB24
50.0000 mg | ORAL_TABLET | Freq: Every day | ORAL | Status: DC
  Administered 2018-06-03 – 2018-06-11 (×9): 50 mg via ORAL
  Filled 2018-06-03 (×9): qty 1

## 2018-06-03 MED ORDER — ADULT MULTIVITAMIN W/MINERALS CH
1.0000 | ORAL_TABLET | Freq: Every day | ORAL | Status: DC
  Administered 2018-06-03 – 2018-06-13 (×11): 1 via ORAL
  Filled 2018-06-03 (×11): qty 1

## 2018-06-03 MED ORDER — GABAPENTIN 100 MG PO CAPS
100.0000 mg | ORAL_CAPSULE | Freq: Two times a day (BID) | ORAL | Status: DC
  Administered 2018-06-03 – 2018-06-05 (×4): 100 mg via ORAL
  Filled 2018-06-03 (×4): qty 1

## 2018-06-03 MED ORDER — POTASSIUM CHLORIDE CRYS ER 20 MEQ PO TBCR
40.0000 meq | EXTENDED_RELEASE_TABLET | Freq: Once | ORAL | Status: AC
  Administered 2018-06-03: 40 meq via ORAL
  Filled 2018-06-03: qty 2

## 2018-06-03 MED ORDER — MAGNESIUM SULFATE 2 GM/50ML IV SOLN
2.0000 g | Freq: Once | INTRAVENOUS | Status: AC
  Administered 2018-06-03: 2 g via INTRAVENOUS
  Filled 2018-06-03: qty 50

## 2018-06-03 MED ORDER — SODIUM CHLORIDE 0.9 % IV SOLN
INTRAVENOUS | Status: DC | PRN
  Administered 2018-06-03 – 2018-06-09 (×2): 250 mL via INTRAVENOUS
  Administered 2018-06-11: 1000 mL via INTRAVENOUS
  Administered 2018-06-20: 250 mL via INTRAVENOUS

## 2018-06-03 MED ORDER — FUROSEMIDE 10 MG/ML IJ SOLN
40.0000 mg | Freq: Two times a day (BID) | INTRAMUSCULAR | Status: DC
  Administered 2018-06-03 – 2018-06-04 (×3): 40 mg via INTRAVENOUS
  Filled 2018-06-03 (×3): qty 4

## 2018-06-03 MED ORDER — LORAZEPAM 2 MG/ML IJ SOLN: 1.0000 mg | INTRAMUSCULAR | Status: DC | PRN

## 2018-06-03 MED ORDER — PANTOPRAZOLE SODIUM 40 MG PO TBEC
40.0000 mg | DELAYED_RELEASE_TABLET | Freq: Every day | ORAL | Status: DC
  Administered 2018-06-03 – 2018-06-22 (×20): 40 mg via ORAL
  Filled 2018-06-03 (×20): qty 1

## 2018-06-03 NOTE — Progress Notes (Signed)
PT Cancellation Note  Patient Details Name: Vincent Mcclure MRN: [id number] DOB: 07-23-1932   Cancelled Treatment:    Reason Eval/Treat Not Completed: Medical issues which prohibited therapy, HR noted to be in 130-160's / will chack back another time when stable.    Marcelino Freestone PT [phone]  06/03/2018, 11:43 AM

## 2018-06-03 NOTE — Progress Notes (Signed)
Patient Demographics:    Vincent Mcclure, is a 84 y.o. male, DOB - [birth year], JYN:[id number]  Admit date - 06/02/2018   Admitting Physician Md Edison Pace, MD  Outpatient Primary MD for the patient is Opalski, Neoma Laming, DO  LOS - 0   Chief Complaint  Patient presents with  . Cough  . Shortness of Breath        Subjective:    Vincent Mcclure today has no fevers, no emesis,  No chest pain, tachycardia noted, complains of diarrhea with 4 episodes of loose stools in the last 12 hours  Assessment  & Plan :    Active Problems:   HLD (hyperlipidemia)   HTN (hypertension)   Alcohol use/ h/o heavy abuse   PAF (paroxysmal atrial fibrillation) ([name])   Congestive heart failure (CHF) ([name])   Hypokalemia   Atrial fibrillation with RVR ([name])   Hypoalbuminemia  Brief Summary 84 y.o. male with medical history significant of CAD, Colon Cancer, close intolerance, GI bleed with melena, iron deficiency anemia, history of alcohol abuse, Afib,  HTN,  Admitted for CHF exacerbation   Plan:- 1)HFpEF--- last known EF around 55%, mild pulmonary hypertension, patient with chronic grade 1 diastolic dysfunction CHF, appears somewhat volume overloaded at this time, stop IV fluids, diuresed with IV Lasix, monitor electrolytes and renal function closely with diuresis, daily weights and input/output monitoring, patient has history of CAD without acute ACS type symptoms, serial troponins without any significant elevation  2)Hypokalemia--potassium is down to 2.7 in the setting of diarrhea, may get worse with IV Lasix use, replace and recheck potassium, check serum magnesium especially given history of alcohol abuse  3)Chronic Afib---- now with RVR, resume Toprol XL 50 mg daily, patient is not on anticoagulation due to prior history of bleeding and alcohol abuse  4)Pneumoperitoneum--- as per surgical service [tolerating]  5)ETOH--- patient  states he was not drinking just prior to coming into the hospital, no evidence of DTs at this time PRN lorazepam and scheduled multivitamins ordered  Code Status : Full Code  Disposition Plan  : TBD  Consults  :  Surgery is Primary Team, [facility] is consulting   DVT Prophylaxis  :  Lovenox   Lab Results  Component Value Date   PLT 383 06/02/2018    Inpatient Medications  Scheduled Meds: . enalapril  2.5 mg Oral BID  . enoxaparin (LOVENOX) injection  40 mg Subcutaneous QHS  . furosemide  40 mg Intravenous BID  . metoprolol succinate  50 mg Oral Daily  . pantoprazole  40 mg Oral Daily  . potassium chloride  40 mEq Oral Once   Continuous Infusions: . sodium chloride 250 mL (06/03/18 0939)   PRN Meds:.sodium chloride, ondansetron **OR** ondansetron (ZOFRAN) IV    Anti-infectives (From admission, onward)   None        Objective:   Vitals:   06/03/18 0650 06/03/18 0756 06/03/18 1341 06/03/18 1413  BP:  125/77  (!) 144/96  Pulse:  (!) 101  (!) 101  Resp:    (!) 22  Temp:    98.5 F (36.9 C)  TempSrc:    Oral  SpO2:  98% 95% 98%  Weight: 106.2 kg     Height:  Wt Readings from Last 3 Encounters:  06/03/18 106.2 kg  05/19/18 97.5 kg  05/18/18 98.7 kg    Intake/Output Summary (Last 24 hours) at 06/03/2018 1532 Last data filed at 06/03/2018 6789 Gross per 24 hour  Intake 700 ml  Output 1950 ml  Net -1250 ml   Physical Exam  Gen:- Awake Alert,  In no apparent distress  HEENT:- [place].AT, No sclera icterus Neck-Supple Neck,No JVD,.  Lungs-diminished in bases with scattered rhonchi CV- S1, S2 normal, irregularly irregular heart rate around 110 Abd-  +ve B.Sounds, Abd Soft, No significant tenderness,    Extremity/Skin:- 2 +ve pitting  edema,   Psych-affect is appropriate, oriented x3 Neuro-no new focal deficits, no tremors   Data Review:   Micro Results Recent Results (from the past 240 hour(s))  Culture, blood (routine x 2)     Status: None  (Preliminary result)   Collection Time: 06/02/18  6:01 PM  Result Value Ref Range Status   Specimen Description   Final    BLOOD RIGHT ANTECUBITAL Performed at [facility] [address] [place], [place] [address]    Special Requests   Final    BOTTLES DRAWN AEROBIC AND ANAEROBIC Blood Culture adequate volume Performed at [facility] [address], [place], [place] [address]    Culture   Final    NO GROWTH < 24 HOURS Performed at [facility] [address] [place], [place] [address]    Report Status PENDING  Incomplete  Culture, blood (routine x 2)     Status: None (Preliminary result)   Collection Time: 06/02/18  6:06 PM  Result Value Ref Range Status   Specimen Description   Final    BLOOD LEFT ANTECUBITAL Performed at [facility] [address] [place], [place] [address]    Special Requests   Final    BOTTLES DRAWN AEROBIC AND ANAEROBIC Blood Culture adequate volume Performed at [facility] [address], [place], [place] [address]    Culture   Final    NO GROWTH < 24 HOURS Performed at [facility] [address], [place], [place] [address]    Report Status PENDING  Incomplete    Radiology Reports Dg Chest 2 View  Result Date: 06/02/2018 CLINICAL DATA:  84 y/o M; laparoscopic partial colectomy 05/19/2018. Shortness of breath and congestion. EXAM: CHEST - 2 VIEW COMPARISON:  05/22/2018 chest radiograph FINDINGS: Stable normal cardiac silhouette given projection and technique. Post median sternotomy with wires in alignment. Aortic atherosclerosis with calcification. Clear lungs. No pleural effusion or pneumothorax. Pneumoperitoneum. No acute osseous abnormality is evident. IMPRESSION: 1. Pneumoperitoneum. 2. No acute pulmonary process identified. These results were called by telephone at the time of interpretation on 06/02/2018 at 5:46 pm to Dr. Vallery Ridge, who verbally  acknowledged these results. Electronically Signed   By: Kristine Garbe M.D.   On: 06/02/2018 17:48   Dg Abd 1 View  Result Date: 05/22/2018 CLINICAL DATA:  Status post NG tube placement today. EXAM: ABDOMEN - 1 VIEW COMPARISON:  Plain films of the abdomen earlier today. FINDINGS: NG tube is in place with both the tip and side-port in the stomach. Diffuse gaseous distension of bowel is seen as on the prior exam. Surgical staples in the midline are noted. IMPRESSION: NG tube in good position. Mild diffuse gaseous distention of bowel likely due to ileus. Electronically Signed   By: Inge Rise M.D.   On: 05/22/2018 11:15  Dg Abd 1 View  Result Date: 05/22/2018 CLINICAL DATA:  Abdominal distention. Status post right colectomy for obstructing hepatic flexure lesion of the colon. EXAM: ABDOMEN - 1 VIEW COMPARISON:  Radiographs dated 05/19/2018 and CT scan dated 05/18/2018 FINDINGS: There is increased air throughout the bowel with an increased number of slightly distended small bowel loops. Given the patient's recent surgery, this probably represents an ileus. The colon is not distended. IMPRESSION: Increased air throughout the bowel with an increased number slightly dilated small bowel loops, most likely ileus. Electronically Signed   By: Lorriane Shire M.D.   On: 05/22/2018 08:30   Ct Abdomen Pelvis W Contrast  Result Date: 05/18/2018 CLINICAL DATA:  84 year old male with history of bloating and constipation for the past 2 days. Emesis today. EXAM: CT ABDOMEN AND PELVIS WITH CONTRAST TECHNIQUE: Multidetector CT imaging of the abdomen and pelvis was performed using the standard protocol following bolus administration of intravenous contrast. CONTRAST:  170mL ISOVUE-300 IOPAMIDOL (ISOVUE-300) INJECTION 61% COMPARISON:  CT the abdomen and pelvis 05/22/2009. FINDINGS: Lower chest: Aortic atherosclerosis. Atherosclerotic calcifications noted in the left main, left anterior descending, left  circumflex and right coronary arteries. Thickening calcification of the aortic valve. Lipomatous hypertrophy of the interatrial septum (normal anatomical variant) incidentally noted. Status post median sternotomy for CABG. Hepatobiliary: No cystic or solid hepatic lesions. No intra or extrahepatic biliary ductal dilatation. Gallbladder is normal in appearance. Pancreas: No pancreatic mass. No pancreatic ductal dilatation. No pancreatic or peripancreatic fluid or inflammatory changes. Spleen: Calcified granulomas throughout the spleen. Adrenals/Urinary Tract: 2.4 cm left adrenal nodule (axial image 35 of series 2), stable compared to remote prior examinations, previously characterized as an adenoma. Right adrenal gland is unremarkable. Multiple subcentimeter low-attenuation lesions in the left kidney, too small to characterize, but statistically likely to represent tiny cysts. 1.4 cm simple cyst in the interpolar region of the left kidney. Vascular calcifications associated with both kidneys. Right kidney is otherwise unremarkable in appearance. No hydroureteronephrosis. Urinary bladder is normal in appearance. Stomach/Bowel: The appearance of the stomach is unremarkable. In the region of the hepatic flexure there is a mass-like area of mural thickening and luminal narrowing which measures approximately 4.7 x 4.5 x 4.7 cm (axial image 40 of series 2 and sagittal image 110 of series 6), highly concerning for primary colonic neoplasm. Slight haziness in the surrounding soft tissues is noted. Proximal to this there is dilatation of the cecum and ascending colon with liquid stool. More distal aspect of the colon and rectum are relatively decompressed. Multiple prominent borderline dilated loops of small bowel measuring up to 3.8 cm. Normal appendix. Vascular/Lymphatic: Aortic atherosclerosis, without evidence of aneurysm or dissection in the abdominal or pelvic vasculature. No lymphadenopathy noted in the abdomen or  pelvis. Reproductive: Prostate gland and seminal vesicles are unremarkable in appearance. Other: No significant volume of ascites.  No pneumoperitoneum. Musculoskeletal: There are no aggressive appearing lytic or blastic lesions noted in the visualized portions of the skeleton. Median sternotomy wires. IMPRESSION: 1. There appears to be a nearly obstructing lesion in the region of the hepatic flexure of the colon which is highly concerning for primary colonic neoplasm. Slight haziness of the surrounding soft tissues may be indicative of early local invasion. This appears partially obstructive as evidenced by dilatation of more proximal aspects of the small bowel and colon. 2. Left adrenal lesion is stable compared to prior studies, previously characterized as an adenoma. 3. Aortic atherosclerosis, in addition to left main and 3 vessel coronary  artery disease. Status post median sternotomy for CABG. 4. Additional incidental findings, as above. Aortic Atherosclerosis (ICD10-I70.0). Electronically Signed   By: Vinnie Langton M.D.   On: 05/18/2018 22:07   Dg Chest Port 1 View  Result Date: 05/22/2018 CLINICAL DATA:  Status post NG tube placement today. Shortness of breath and abdominal pain. EXAM: PORTABLE CHEST 1 VIEW COMPARISON:  Plain films chest and abdomen 05/18/2018. FINDINGS: NG tube courses into the stomach and below the inferior margin of the film. Lungs are clear. Heart size is normal. Aortic atherosclerosis is noted. The patient is status post CABG. No pneumothorax or pleural effusion. No acute bony abnormality. IMPRESSION: NG tube courses into the stomach and below the inferior margin of the film. No acute disease. Atherosclerosis. Electronically Signed   By: Inge Rise M.D.   On: 05/22/2018 11:14   Dg Abdomen Acute W/chest  Result Date: 05/18/2018 CLINICAL DATA:  84 year old male with history of constipation, bloating and nausea for the past 4 days. EXAM: DG ABDOMEN ACUTE W/ 1V CHEST  COMPARISON:  Chest x-ray 09/14/2017. FINDINGS: Lung volumes are normal. No consolidative airspace disease. No pleural effusions. No pneumothorax. No pulmonary nodule or mass noted. Pulmonary vasculature and the cardiomediastinal silhouette are within normal limits. Low aortic atherosclerosis. Status post median sternotomy. Multiple dilated loops of gas-filled small bowel are noted measuring up to 5.7 cm in diameter with several air-fluid levels noted on the upright projection. Gas and stool is noted throughout the colon extending to the level of the distal rectum. No pneumoperitoneum. IMPRESSION: 1. Nonspecific bowel gas pattern, as above. Findings could either reflect early or partial small bowel obstruction, or could be indicative of ileus. 2. No pneumoperitoneum. 3. No radiographic evidence of acute cardiopulmonary disease. 4. Aortic atherosclerosis. Electronically Signed   By: Vinnie Langton M.D.   On: 05/18/2018 20:19   Dg Abd Portable 1v  Result Date: 05/26/2018 CLINICAL DATA:  Postop ileus EXAM: PORTABLE ABDOMEN - 1 VIEW COMPARISON:  05/25/2018 FINDINGS: NG tube remains present with the tip likely in the descending duodenum. Diffuse gaseous distention of bowel again noted, not significantly changed, likely ileus. No visible free air or organomegaly. IMPRESSION: Stable ileus pattern. Electronically Signed   By: Rolm Baptise M.D.   On: 05/26/2018 08:54   Dg Abd Portable 1v  Result Date: 05/25/2018 CLINICAL DATA:  Postoperative ileus. EXAM: PORTABLE ABDOMEN - 1 VIEW COMPARISON:  Radiographs of January 22, 2018. FINDINGS: Nasogastric tube tip is seen in expected position of distal stomach. Continued presence of small bowel dilatation is noted without significant colonic dilatation. IMPRESSION: Continued small bowel dilatation is noted which may represent postoperative ileus, but follow-up radiographs are recommended to rule out distal small bowel obstruction. Electronically Signed   By: Marijo Conception,  M.D.   On: 05/25/2018 09:28   Dg Abd Portable 1v  Result Date: 05/24/2018 CLINICAL DATA:  Abdominal distention, postop ileus EXAM: PORTABLE ABDOMEN - 1 VIEW COMPARISON:  05/23/2018 FINDINGS: NG tube has likely passed into the proximal descending duodenum. Continued diffuse gaseous distention of bowel, likely ileus. No visible free air organomegaly. IMPRESSION: Stable diffuse significant gaseous distention of bowel, likely ileus. Electronically Signed   By: Rolm Baptise M.D.   On: 05/24/2018 09:30   Dg Abd Portable 1v  Result Date: 05/23/2018 CLINICAL DATA:  Postoperative ileus EXAM: PORTABLE ABDOMEN - 1 VIEW COMPARISON:  05/22/2018 FINDINGS: NG tube tip remains in the stomach. Diffuse gaseous distention of bowel again noted, likely ileus, unchanged. IMPRESSION: No significant  change since prior study. Electronically Signed   By: Rolm Baptise M.D.   On: 05/23/2018 07:15   Dg Abd Portable 1v  Result Date: 05/19/2018 CLINICAL DATA:  84 year old male with a history surgery. Operative plain film for incomplete surgical instrument count EXAM: PORTABLE ABDOMEN - 1 VIEW COMPARISON:  05/18/2018 FINDINGS: Gas within small bowel, colon.  Borderline dilated small bowel. Surgical changes along the midline abdomen of laparotomy. Surgical changes of the right upper quadrant. Surgical changes of median sternotomy and CABG. No unexpected radiopaque foreign body. Calcifications of the vasculature. IMPRESSION: Plain film is negative for radiopaque foreign body. Surgical changes of the abdomen, as above. These results were called by telephone at the time of interpretation on 05/19/2018 at 12:48 pm to circulating team in OR 2, who verbally acknowledged these results. Electronically Signed   By: Corrie Mckusick D.O.   On: 05/19/2018 12:48     CBC Recent Labs  Lab 05/30/18 0406 06/02/18 1700 06/02/18 2355  WBC 8.5 9.7 9.3  HGB 11.4* 11.9* 11.0*  HCT 35.1* 36.9* 35.0*  PLT 286 425* 383  MCV 89.1 89.1 89.1  MCH 28.9  28.7 28.0  MCHC 32.5 32.2 31.4  RDW 14.4 14.2 14.2  LYMPHSABS  --  0.6*  --   MONOABS  --  0.9  --   EOSABS  --  0.0  --   BASOSABS  --  0.0  --     Chemistries  Recent Labs  Lab 05/30/18 0406 06/02/18 1700 06/02/18 1801 06/02/18 2355 06/03/18 0615  NA 139 141  --   --  141  K 3.1* 2.7*  --   --  2.7*  CL 101 95*  --   --  97*  CO2 31 31  --   --  33*  GLUCOSE 128* 121*  --   --  123*  BUN 9 12  --   --  12  CREATININE 0.89 1.01  --  0.93 0.96  CALCIUM 8.0* 8.3*  --   --  8.0*  MG 1.7  --  1.7  --   --   AST  --  47*  --   --   --   ALT  --  34  --   --   --   ALKPHOS  --  121  --   --   --   BILITOT  --  0.5  --   --   --    ------------------------------------------------------------------------------------------------------------------ No results for input(s): CHOL, HDL, LDLCALC, TRIG, CHOLHDL, LDLDIRECT in the last 72 hours.  Lab Results  Component Value Date   HGBA1C 5.4 05/19/2018   ------------------------------------------------------------------------------------------------------------------ Recent Labs    06/03/18 0615  TSH 0.407   ------------------------------------------------------------------------------------------------------------------ No results for input(s): VITAMINB12, FOLATE, FERRITIN, TIBC, IRON, RETICCTPCT in the last 72 hours.  Coagulation profile Recent Labs  Lab 06/02/18 1801  INR 1.15    No results for input(s): DDIMER in the last 72 hours.  Cardiac Enzymes Recent Labs  Lab 06/02/18 2355 06/03/18 0615 06/03/18 1158  TROPONINI 0.03* 0.03* <0.03   ------------------------------------------------------------------------------------------------------------------    Component Value Date/Time   BNP 165.5 (H) 06/02/2018 1801     Roxan Hockey M.D on 06/03/2018 at 3:32 PM  Pager---[phone] Go to [email or web address] - password [email or web address] for contact info  [facility] - Office  [phone]

## 2018-06-03 NOTE — Progress Notes (Signed)
OT Cancellation Note  Patient Details Name: Vincent Mcclure MRN: [id number] DOB: Feb 26, 1932   Cancelled Treatment:    Reason Eval/Treat Not Completed: Other (comment). Pt with low K+ this am and has irregular HR. Will check back tomorrow  [name] 06/03/2018, 12:43 PM  Lesle Chris, OTR/L [phone] 06/03/2018

## 2018-06-03 NOTE — Progress Notes (Signed)
Assessment & Plan: HD#2 - History of partial colectomy for adenocarcinoma of colon             Open abdominal wound healing by secondary intention - begin dressing changes BID             Pneumoperitoneum on CXR - possibly residual from surgery, no clinical evidence perforated viscus at this time, will monitor  Congestive heart failure             Pulmonary congestion, increased work of breathing             LE edema             Appreciate [facility] consultation and management             May need cardiology evaluation this admission  Hypokalemia             KCL administered last PM  Await BMET results this AM        Earnstine Regal, MD, [facility]       Office: [phone]   Chief Complaint: SOB, edema, failure to thrive  Subjective: Patient appears improved this AM, alert, responsive.  Work of breathing better.  Objective: Vital signs in last 24 hours: Temp:  [98.3 F (36.8 C)-99.4 F (37.4 C)] 98.7 F (37.1 C) (08/24 0422) Pulse Rate:  [63-128] 88 (08/24 0422) Resp:  [16-40] 18 (08/24 0422) BP: (116-146)/(65-90) 116/75 (08/24 0422) SpO2:  [95 %-100 %] 99 % (08/24 0422) Weight:  [98.1 kg-106.2 kg] 106.2 kg (08/24 0650)    Intake/Output from previous day: 08/23 0701 - 08/24 0700 In: 700 [P.O.:300; I.V.:300; IV Piggyback:100] Out: 1000 [Urine:1000] Intake/Output this shift: No intake/output data recorded.  Physical Exam: HEENT - sclerae clear, mucous membranes moist Neck - soft Chest - coarse bilaterally Cor - RRR Abdomen - soft, protuberant; non-tender; mid-line wound with open lower 5cm with early granulation, some serous drainage Ext - moderate LE edema, improved Neuro - alert & oriented, no focal deficits  Lab Results:  Recent Labs    06/02/18 1700 06/02/18 2355  WBC 9.7 9.3  HGB 11.9* 11.0*  HCT 36.9* 35.0*  PLT 425* 383   BMET Recent Labs    06/02/18 1700 06/02/18 2355  NA 141  --   K 2.7*   --   CL 95*  --   CO2 31  --   GLUCOSE 121*  --   BUN 12  --   CREATININE 1.01 0.93  CALCIUM 8.3*  --    PT/INR Recent Labs    06/02/18 1801  LABPROT 14.6  INR 1.15   Comprehensive Metabolic Panel:    Component Value Date/Time   NA 141 06/02/2018 1700   NA 139 05/30/2018 0406   NA 139 01/17/2018 1401   NA 138 08/10/2017 1203   K 2.7 (LL) 06/02/2018 1700   K 3.1 (L) 05/30/2018 0406   CL 95 (L) 06/02/2018 1700   CL 101 05/30/2018 0406   CO2 31 06/02/2018 1700   CO2 31 05/30/2018 0406   BUN 12 06/02/2018 1700   BUN 9 05/30/2018 0406   BUN 13 01/17/2018 1401   BUN 15 08/10/2017 1203   CREATININE 0.93 06/02/2018 2355   CREATININE 1.01 06/02/2018 1700   GLUCOSE 121 (H) 06/02/2018 1700   GLUCOSE 128 (H) 05/30/2018 0406   CALCIUM 8.3 (L) 06/02/2018 1700   CALCIUM 8.0 (L) 05/30/2018 0406  AST 47 (H) 06/02/2018 1700   AST 28 05/18/2018 1851   ALT 34 06/02/2018 1700   ALT 11 05/18/2018 1851   ALKPHOS 121 06/02/2018 1700   ALKPHOS 90 05/18/2018 1851   BILITOT 0.5 06/02/2018 1700   BILITOT 0.9 05/18/2018 1851   BILITOT 0.3 01/17/2018 1401   BILITOT 0.4 08/10/2017 1203   PROT 5.7 (L) 06/02/2018 1700   PROT 7.2 05/18/2018 1851   PROT 6.4 01/17/2018 1401   PROT 6.5 08/10/2017 1203   ALBUMIN 2.3 (L) 06/02/2018 1700   ALBUMIN 4.2 05/18/2018 1851   ALBUMIN 4.2 01/17/2018 1401   ALBUMIN 4.4 08/10/2017 1203    Studies/Results: Dg Chest 2 View  Result Date: 06/02/2018 CLINICAL DATA:  84 y/o M; laparoscopic partial colectomy 05/19/2018. Shortness of breath and congestion. EXAM: CHEST - 2 VIEW COMPARISON:  05/22/2018 chest radiograph FINDINGS: Stable normal cardiac silhouette given projection and technique. Post median sternotomy with wires in alignment. Aortic atherosclerosis with calcification. Clear lungs. No pleural effusion or pneumothorax. Pneumoperitoneum. No acute osseous abnormality is evident. IMPRESSION: 1. Pneumoperitoneum. 2. No acute pulmonary process identified.  These results were called by telephone at the time of interpretation on 06/02/2018 at 5:46 pm to Dr. Vallery Ridge, who verbally acknowledged these results. Electronically Signed   By: Kristine Garbe M.D.   On: 06/02/2018 17:48      [place] M 06/03/2018  Patient ID: Vincent Mcclure, male   DOB: [birth year], 84 y.o.   MRN: [id number]

## 2018-06-03 NOTE — Progress Notes (Signed)
CRITICAL VALUE ALERT  Critical Value:  2.7  Date & Time Notied:  06/03/18 8:15 a.m.   Provider Notified: Dr. Harlow Asa  Orders Received/Actions taken: Additional runs of IV K+

## 2018-06-03 NOTE — Plan of Care (Signed)
Patient denies pain on 7 a to 7 p shift, continues to diurese.  Patient maintains oxygen saturation in the mid to high 90's on room air however feels better wearing oxygen.  Up to chair and walked in hallway, continues with dyspnea on exertion however patient states this is improving.  Patient with multiple loose stools this shift, not watery, no abdominal pain or fever.  Wife at bedside for part of shift.  Wife is very concerned that patient was not able to care for himself at home and she had difficulty caring for him.  Wife is questioning if rehab would be appropriate at discharge even though she states patient refused this before.

## 2018-06-03 NOTE — Progress Notes (Signed)
  Echocardiogram 2D Echocardiogram has been performed.  Vincent Mcclure 06/03/2018, 12:04 PM

## 2018-06-04 DIAGNOSIS — I5020 Unspecified systolic (congestive) heart failure: Secondary | ICD-10-CM | POA: Diagnosis not present

## 2018-06-04 DIAGNOSIS — E876 Hypokalemia: Secondary | ICD-10-CM | POA: Diagnosis not present

## 2018-06-04 DIAGNOSIS — I5033 Acute on chronic diastolic (congestive) heart failure: Secondary | ICD-10-CM | POA: Diagnosis not present

## 2018-06-04 LAB — URINE CULTURE: CULTURE: NO GROWTH

## 2018-06-04 LAB — BASIC METABOLIC PANEL
ANION GAP: 12 (ref 5–15)
BUN: 10 mg/dL (ref 8–23)
CHLORIDE: 98 mmol/L (ref 98–111)
CO2: 33 mmol/L — AB (ref 22–32)
Calcium: 8.3 mg/dL — ABNORMAL LOW (ref 8.9–10.3)
Creatinine, Ser: 0.98 mg/dL (ref 0.61–1.24)
GFR calc non Af Amer: >60 mL/min (ref >60)
Glucose, Bld: 148 mg/dL — ABNORMAL HIGH (ref 70–99)
Potassium: 3.3 mmol/L — ABNORMAL LOW (ref 3.5–5.1)
Sodium: 143 mmol/L (ref 135–145)

## 2018-06-04 IMAGING — CR DG CHEST 1V PORT
1 series · 1 of 1 positions shown · non-contrast
Comparison: Yesterday

CLINICAL DATA: Chest tube

EXAM:
PORTABLE CHEST 1 VIEW

[AP]
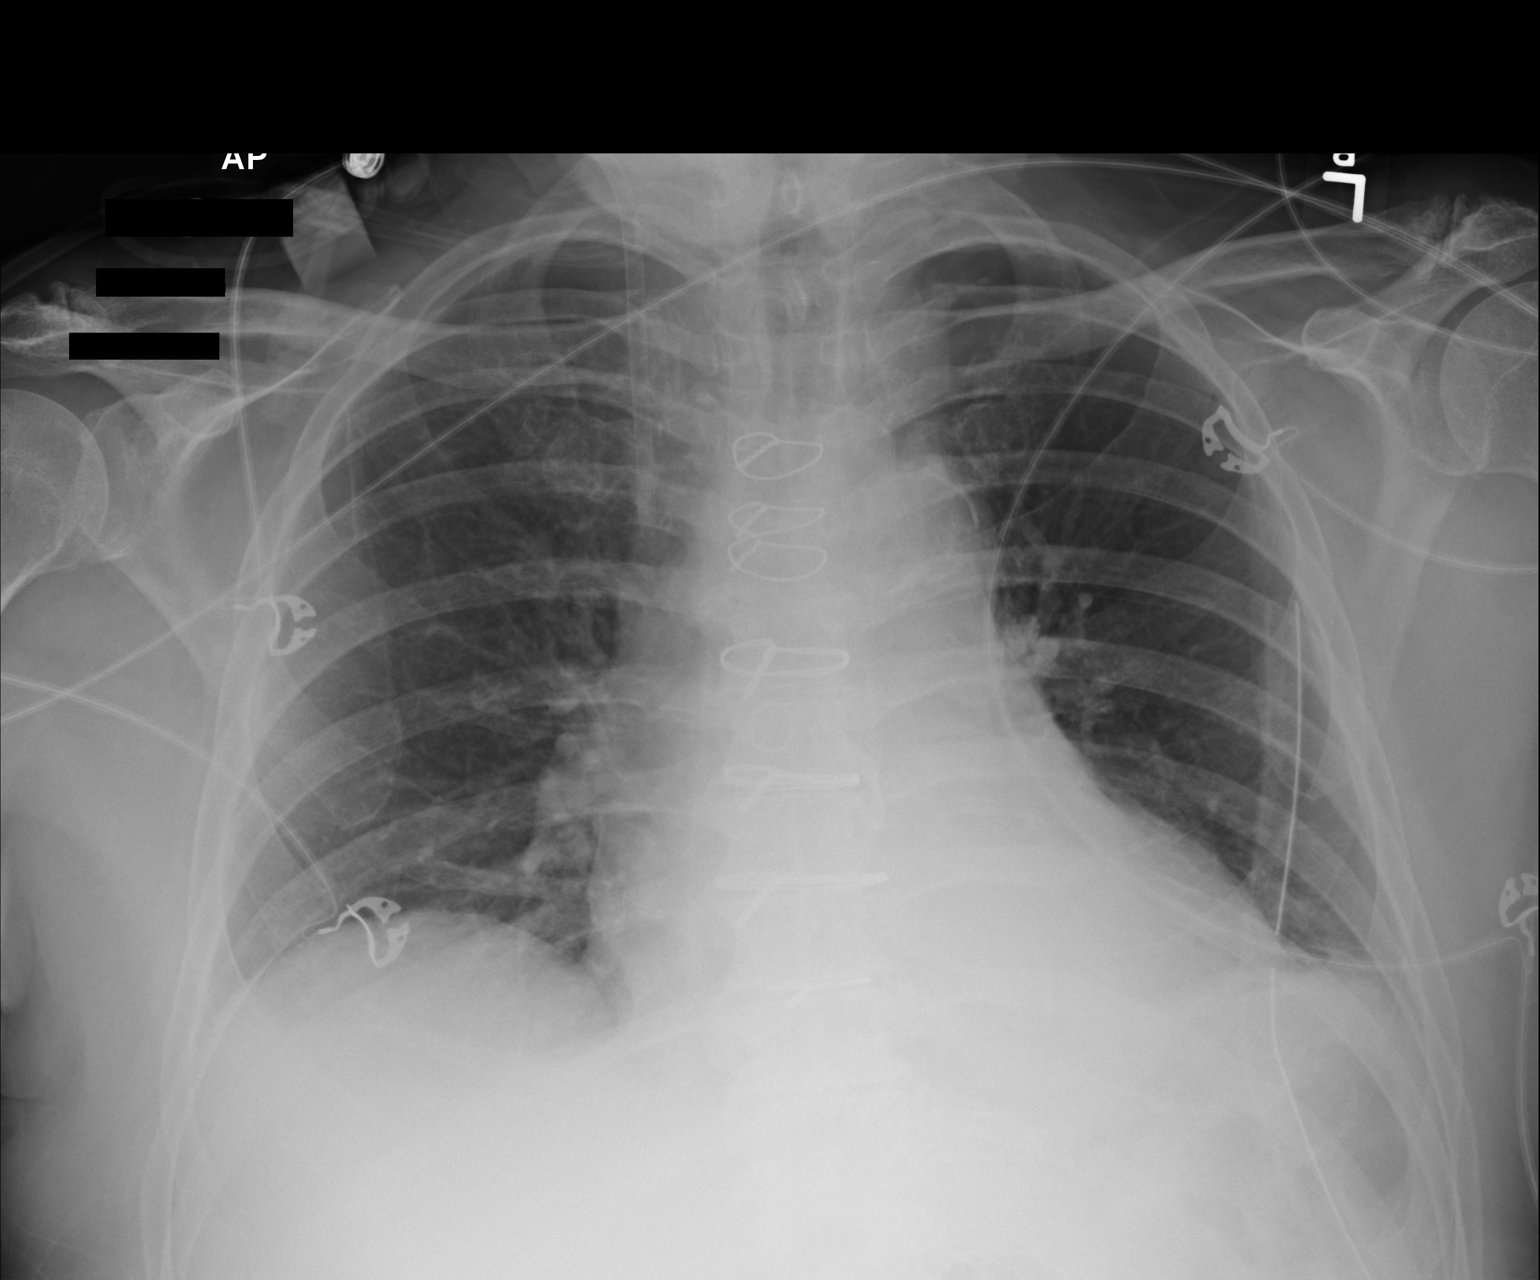

[1 of 1 positions shown; findings below may reference images not displayed]

FINDINGS: Swan-Ganz catheter and mediastinal drain have been removed.
Left-sided chest tube present, shorter than yesterday. Small left
apical pneumothorax, 2 rib interspaces. Minor atelectasis at the
left base. Stable postoperative heart size. No pulmonary edema.
IMPRESSION: 1. Less than 10% left apical pneumothorax. Left chest tube has been
shortened from yesterday.
2. Mild left base atelectasis.

## 2018-06-04 MED ORDER — POTASSIUM CHLORIDE 10 MEQ/100ML IV SOLN
10.0000 meq | INTRAVENOUS | Status: AC
  Administered 2018-06-04 (×2): 10 meq via INTRAVENOUS
  Filled 2018-06-04 (×2): qty 100

## 2018-06-04 MED ORDER — IPRATROPIUM BROMIDE 0.02 % IN SOLN
0.5000 mg | Freq: Three times a day (TID) | RESPIRATORY_TRACT | Status: DC
  Administered 2018-06-04 – 2018-06-05 (×2): 0.5 mg via RESPIRATORY_TRACT
  Filled 2018-06-04 (×2): qty 2.5

## 2018-06-04 MED ORDER — FUROSEMIDE 10 MG/ML IJ SOLN
40.0000 mg | Freq: Every day | INTRAMUSCULAR | Status: DC
  Administered 2018-06-05: 40 mg via INTRAVENOUS
  Filled 2018-06-04: qty 4

## 2018-06-04 MED ORDER — MAGNESIUM SULFATE 2 GM/50ML IV SOLN
2.0000 g | Freq: Once | INTRAVENOUS | Status: AC
  Administered 2018-06-04: 2 g via INTRAVENOUS
  Filled 2018-06-04: qty 50

## 2018-06-04 MED ORDER — TRAZODONE HCL 50 MG PO TABS
50.0000 mg | ORAL_TABLET | Freq: Once | ORAL | Status: AC
  Administered 2018-06-04: 50 mg via ORAL
  Filled 2018-06-04: qty 1

## 2018-06-04 MED ORDER — POTASSIUM CHLORIDE CRYS ER 20 MEQ PO TBCR
40.0000 meq | EXTENDED_RELEASE_TABLET | Freq: Once | ORAL | Status: AC
  Administered 2018-06-04: 40 meq via ORAL
  Filled 2018-06-04: qty 2

## 2018-06-04 NOTE — Progress Notes (Signed)
[facility]:  [phone] General Surgery Progress Note   LOS: 0 days  POD -     Chief Complaint: Heart failure  Assessment and Plan: 1.  Partial right colectomy 05/19/2018 by [name]  for adenocarcinoma of colon Open abdominal wound healing by secondary intention - wound clean - dressing changes BID.  Will start showers today  2.  Congestive heart failure/chronic A Fib  Lopressor has helped rate LE edema Appreciate [facility] consultation and management  3.  Hypokalemia - slowly getting corrected  K+ - 3.3 - 06/04/2018 4.  DVT prophylaxis - Lovenox 5.   Hard of hearing   Active Problems:   HLD (hyperlipidemia)   HTN (hypertension)   Alcohol use/ h/o heavy abuse   PAF (paroxysmal atrial fibrillation) ([name])   Congestive heart failure (CHF) ([name])   Hypokalemia   Atrial fibrillation with RVR ([name])   Hypoalbuminemia  Subjective:  Feels better.  No specific complaint.  Objective:   Vitals:   06/03/18 2154 06/04/18 0516  BP: (!) 134/94 138/67  Pulse: (!) 117 (!) 114  Resp: 20 18  Temp: 98.2 F (36.8 C) 98.4 F (36.9 C)  SpO2: 99% 100%     Intake/Output from previous day:  08/24 0701 - 08/25 0700 In: 487.1 [P.O.:130; I.V.:2.1; IV Piggyback:355] Out: 4665 [Urine:3475]  Intake/Output this shift:  No intake/output data recorded.   Physical Exam:   General: Obese WM who is alert and oriented.    HEENT: Normal. Pupils equal. .   Lungs: Clear   Abdomen: Soft.  BS present   Wound: Tunnels about 6 cm cranially and 3 cm caudad.  Clean.  Will start showers.   Lab Results:    Recent Labs    06/02/18 1700 06/02/18 2355  WBC 9.7 9.3  HGB 11.9* 11.0*  HCT 36.9* 35.0*  PLT 425* 383    BMET   Recent Labs    06/03/18 1458 06/04/18 0436  NA 141 143  K 2.9* 3.3*  CL 98 98  CO2 31 33*  GLUCOSE 152* 148*  BUN 11 10  CREATININE 0.95 0.98  CALCIUM 8.3* 8.3*    PT/INR   Recent Labs   06/02/18 1801  LABPROT 14.6  INR 1.15    ABG  No results for input(s): PHART, HCO3 in the last 72 hours.  Invalid input(s): PCO2, PO2   Studies/Results:  Dg Chest 2 View  Result Date: 06/02/2018 CLINICAL DATA:  84 y/o M; laparoscopic partial colectomy 05/19/2018. Shortness of breath and congestion. EXAM: CHEST - 2 VIEW COMPARISON:  05/22/2018 chest radiograph FINDINGS: Stable normal cardiac silhouette given projection and technique. Post median sternotomy with wires in alignment. Aortic atherosclerosis with calcification. Clear lungs. No pleural effusion or pneumothorax. Pneumoperitoneum. No acute osseous abnormality is evident. IMPRESSION: 1. Pneumoperitoneum. 2. No acute pulmonary process identified. These results were called by telephone at the time of interpretation on 06/02/2018 at 5:46 pm to Dr. Vallery Ridge, who verbally acknowledged these results. Electronically Signed   By: Kristine Garbe M.D.   On: 06/02/2018 17:48     Anti-infectives:   Anti-infectives (From admission, onward)   None      Alphonsa Overall, MD, FACS Pager: [facility]: [phone] 06/04/2018

## 2018-06-04 NOTE — Evaluation (Signed)
Physical Therapy Evaluation Patient Details Name: Vincent Mcclure MRN: [id number] DOB: November 13, 1931 Today's Date: 06/04/2018   History of Present Illness  84 y.o. male with medical history significant of CAD s/p CABG in 2018, PAF not on anticoagulation due to h/o GIB in past, and s/p exp lap with colectomy 05/19/18.  He was admitted from his follow up surgical appt due to weakness and congestion.     Clinical Impression  Pt admitted with above diagnosis. Pt currently with functional limitations due to the deficits listed below (see PT Problem List). On eval, pt required min assist bed mobility, min assist sit to stand and min guard assist ambulation 100 feet with RW. He demonstrates poor activity tolerance with DOE noted after minimal activity. Pt will benefit from skilled PT to increase their independence and safety with mobility to allow discharge to the venue listed below.     Follow Up Recommendations SNF;Supervision/Assistance - 24 hour    Equipment Recommendations  None recommended by PT    Recommendations for Other Services       Precautions / Restrictions Precautions Precautions: Fall;Other (comment) Precaution Comments: watch HR and sats      Mobility  Bed Mobility Overal bed mobility: Needs Assistance Bed Mobility: Supine to Sit     Supine to sit: Min assist     General bed mobility comments: +rail, assist to initiate RLE OOB and to elevate trunk  Transfers Overall transfer level: Needs assistance Equipment used: Ambulation equipment used Transfers: Sit to/from [facility] Sit to Stand: Min assist Stand pivot transfers: Min guard       General transfer comment: assist to power up, cues for sequencing  Ambulation/Gait Ambulation/Gait assistance: Min guard Gait Distance (Feet): 100 Feet Assistive device: Rolling walker (2 wheeled) Gait Pattern/deviations: Step-through pattern;Decreased stride length Gait velocity: decreased  Gait velocity  interpretation: <1.31 ft/sec, indicative of household ambulator General Gait Details: Pt ambulated on RA with SpO2 95% and max HR 120.  Stairs            Wheelchair Mobility    Modified Rankin (Stroke Patients Only)       Balance Overall balance assessment: Needs assistance Sitting-balance support: No upper extremity supported;Feet supported Sitting balance-Leahy Scale: Good     Standing balance support: Bilateral upper extremity supported;During functional activity Standing balance-Leahy Scale: Fair Standing balance comment: reliant on UEs for dynamic activities                             Pertinent Vitals/Pain Pain Assessment: No/denies pain    Home Living Family/patient expects to be discharged to:: Private residence Living Arrangements: Spouse/significant other Available Help at Discharge: Available 24 hours/day;Family Type of Home: House Home Access: Ramped entrance     Home Layout: One level Home Equipment: Walker - 2 wheels;Cane - single point      Prior Function Level of Independence: Independent with assistive device(s)         Comments: uses cane at times;      Hand Dominance        Extremity/Trunk Assessment   Upper Extremity Assessment Upper Extremity Assessment: Defer to OT evaluation    Lower Extremity Assessment Lower Extremity Assessment: Generalized weakness    Cervical / Trunk Assessment Cervical / Trunk Assessment: Normal  Communication   Communication: HOH  Cognition Arousal/Alertness: Awake/alert Behavior During Therapy: WFL for tasks assessed/performed Overall Cognitive Status: Within Functional Limits for tasks assessed  General Comments: pleasant and friendly, decreased awareness of deficits      General Comments      Exercises     Assessment/Plan    PT Assessment Patient needs continued PT services  PT Problem List Decreased activity  tolerance;Decreased balance;Decreased mobility;Decreased knowledge of use of DME;Decreased strength;Decreased safety awareness;Cardiopulmonary status limiting activity       PT Treatment Interventions DME instruction;Therapeutic activities;Gait training;Therapeutic exercise;Patient/family education;Functional mobility training;Balance training    PT Goals (Current goals can be found in the Care Plan section)  Acute Rehab PT Goals Patient Stated Goal: get stronger PT Goal Formulation: With patient Time For Goal Achievement: 06/18/18 Potential to Achieve Goals: Good    Frequency Min 3X/week   Barriers to discharge        Co-evaluation               AM-PAC PT "6 Clicks" Daily Activity  Outcome Measure Difficulty turning over in bed (including adjusting bedclothes, sheets and blankets)?: A Little Difficulty moving from lying on back to sitting on the side of the bed? : A Lot Difficulty sitting down on and standing up from a chair with arms (e.g., wheelchair, bedside commode, etc,.)?: A Little Help needed moving to and from a bed to chair (including a wheelchair)?: A Little Help needed walking in hospital room?: A Little Help needed climbing 3-5 steps with a railing? : A Lot 6 Click Score: 16    End of Session Equipment Utilized During Treatment: Gait belt Activity Tolerance: Patient tolerated treatment well Patient left: in chair;with call bell/phone within reach Nurse Communication: Mobility status PT Visit Diagnosis: Unsteadiness on feet (R26.81);Muscle weakness (generalized) (M62.81);History of falling (Z91.81)    Time: 2334-3568 PT Time Calculation (min) (ACUTE ONLY): 20 min   Charges:   PT Evaluation $PT Eval Moderate Complexity: 1 Mod          Lorrin Goodell, PT  Office # [phone] Pager [phone]   Lorriane Shire 06/04/2018, 10:05 AM

## 2018-06-04 NOTE — Plan of Care (Signed)
VSS, patient tolerating carb modified diet without issue.  Patients dyspnea has improved, maintains oxygen saturation in the 90's on room air although "feels better" when he wears oxygen intermittently.  Up to chair, walked in hall with PT.  Wife at bedside.  Patient did shower this shift with dressing removed at verbal order of Dr. Lucia Gaskins, wet to dry dressing replaced after shower.  Tolerated well.

## 2018-06-04 NOTE — Care Management Obs Status (Signed)
[facility] NOTIFICATION   Patient Details  Name: Vincent Mcclure MRN: [id number] Date of Birth: September 18, 1932   Medicare Observation Status Notification Given:  Yes    Erenest Rasher, RN 06/04/2018, 12:54 PM

## 2018-06-04 NOTE — Progress Notes (Signed)
OT Cancellation Note  Patient Details Name: KIRILL CHATTERJEE MRN: [id number] DOB: 1932/01/04   Cancelled Treatment:    Reason Eval/Treat Not Completed: Other (comment). Pt is back in bed. Will check on him tomorrow am.  Elven Laboy 06/04/2018, 12:16 PM  Lesle Chris, OTR/L [phone] 06/04/2018

## 2018-06-04 NOTE — Progress Notes (Addendum)
Patient Demographics:    Vincent Mcclure, is a 84 y.o. male, DOB - 03-04-1932, BUL:[id number]  Admit date - 06/02/2018   Admitting Physician Md Edison Pace, MD  Outpatient Primary MD for the patient is Opalski, Neoma Laming, DO  LOS - 0   Chief Complaint  Patient presents with  . Cough  . Shortness of Breath        Subjective:    Vincent Mcclure today has no fevers, no emesis, no chest pain, tachycardia noted. Reports only 1-2 episodes of soft stools yesterday. Still wheezing.    Assessment  & Plan :    Active Problems:   HLD (hyperlipidemia)   HTN (hypertension)   Alcohol use/ h/o heavy abuse   PAF (paroxysmal atrial fibrillation) ([name])   Congestive heart failure (CHF) ([name])   Hypokalemia   Atrial fibrillation with RVR ([name])   Hypoalbuminemia  Brief Summary 84 y.o. male with medical history significant of CAD, Colon adenoCA, close intolerance, GI bleed with melena, iron deficiency anemia, history of alcohol abuse, Afib,  HTN, recent colectomy 05/19/18 for adenoCA. Admitted for CHF exacerbation   Plan:- 1) Respiratory distress in setting of HFpEF exacerbation--- last known EF around 55%, mild pulmonary hypertension, patient with chronic grade 1 diastolic dysfunction CHF, appears somewhat volume overloaded at this time. Continue IV diuresis - dry weight ~ 94 kg - starting ipratropium nebs TID - continue IV diuresis lasix 40mg  daily (decreased from BID) - net neg goal 2L per day - aggressive lytes repletion Mag > 2 and K > 4 - on enalapril 2.5mg  BID  2)Hypokalemia--potassium nadir to 2.7 in the setting of diarrhea, may get worse with IV Lasix use, replace and recheck potassium, check serum magnesium especially given history of alcohol abuse - K repleted today (3.3 this AM) with total 60 mEq  3)Chronic Afib---- now with RVR, resume Toprol XL 50 mg daily, patient is not on anticoagulation due to prior history of  bleeding? and alcohol abuse. CHA2DS2/VAS score is 5 - consider discussing with surg re anticoagulation safety prior to discharge given elevated stroke risk, assuming that prior GI bleed was secondary to adenoCA which has since been resected   4)Pneumoperitoneum and recent colectomy on 05/19/18 for adenoCA--- as per surgical service [tolerating] - wound care as per surgical team - on low dose gabapentin for abdominal pain - PPI for reflux  5)ETOH abuse hx--- patient states he was not drinking just prior to coming into the hospital, no evidence of DTs at this time PRN lorazepam and scheduled multivitamins ordered   Code Status : Full Code  Disposition Plan  : TBD pending resp improvement  Consults  :  Surgery is Primary Team, [facility] is consulting   DVT Prophylaxis  :  Lovenox   Lab Results  Component Value Date   PLT 383 06/02/2018    Inpatient Medications  Scheduled Meds: . enalapril  2.5 mg Oral BID  . enoxaparin (LOVENOX) injection  40 mg Subcutaneous QHS  . furosemide  40 mg Intravenous BID  . gabapentin  100 mg Oral BID  . metoprolol succinate  50 mg Oral Daily  . multivitamin with minerals  1 tablet Oral Daily  . pantoprazole  40 mg Oral Daily   Continuous Infusions: . sodium  chloride 250 mL (06/03/18 0939)   PRN Meds:.sodium chloride, LORazepam, ondansetron **OR** ondansetron (ZOFRAN) IV    Anti-infectives (From admission, onward)   None        Objective:   Vitals:   06/03/18 2154 06/04/18 0516 06/04/18 0523 06/04/18 1404  BP: (!) 134/94 138/67  108/66  Pulse: (!) 117 (!) 114  98  Resp: 20 18  (!) 26  Temp: 98.2 F (36.8 C) 98.4 F (36.9 C)  98 F (36.7 C)  TempSrc:    Oral  SpO2: 99% 100%  95%  Weight:   100.5 kg   Height:        Wt Readings from Last 3 Encounters:  06/04/18 100.5 kg  05/19/18 97.5 kg  05/18/18 98.7 kg    Intake/Output Summary (Last 24 hours) at 06/04/2018 1432 Last data filed at 06/04/2018 0518 Gross per 24 hour    Intake 217.88 ml  Output 2525 ml  Net -2307.12 ml   Physical Exam  Gen:- Awake Alert,  In no apparent distress  HEENT:- [place].AT, No sclera icterus Neck-Supple Neck,No JVD,.  Lungs-diminished in bases with scattered rhonchi; end expiratory wheezing CV- S1, S2 normal, irregularly irregular heart rate, tachycardic intermittently; no murmurs Abd-  +ve B.Sounds, Abd Soft, No significant tenderness,    Extremity/Skin:- 2 +ve pitting  edema,   Psych-affect is appropriate, oriented x3 Neuro-no new focal deficits, no tremors   Data Review:   Micro Results Recent Results (from the past 240 hour(s))  Culture, blood (routine x 2)     Status: None (Preliminary result)   Collection Time: 06/02/18  6:01 PM  Result Value Ref Range Status   Specimen Description   Final    BLOOD RIGHT ANTECUBITAL Performed at [facility] [address] [place], [place] [address]    Special Requests   Final    BOTTLES DRAWN AEROBIC AND ANAEROBIC Blood Culture adequate volume Performed at [facility] [address] [place], [place] [address]    Culture   Final    NO GROWTH < 24 HOURS Performed at [facility] [address], [place], [place] [address]    Report Status PENDING  Incomplete  Culture, blood (routine x 2)     Status: None (Preliminary result)   Collection Time: 06/02/18  6:06 PM  Result Value Ref Range Status   Specimen Description   Final    BLOOD LEFT ANTECUBITAL Performed at [facility] [address] [place], [place] [address]    Special Requests   Final    BOTTLES DRAWN AEROBIC AND ANAEROBIC Blood Culture adequate volume Performed at [facility] [address], [place], [place] [address]    Culture   Final    NO GROWTH < 24 HOURS Performed at [facility] [address], [place], [place] [address]    Report Status PENDING  Incomplete  Urine culture     Status: None   Collection Time:  06/03/18 10:05 AM  Result Value Ref Range Status   Specimen Description   Final    URINE, CLEAN CATCH Performed at [facility], [place] [address], [place], [place] [address]    Special Requests   Final    NONE Performed at [facility], [place] [address], [place], [place] [address]    Culture   Final    NO GROWTH Performed at [facility], [place] [address] [place], [place] [address]  Report Status 06/04/2018 FINAL  Final    Radiology Reports Dg Chest 2 View  Result Date: 06/02/2018 CLINICAL DATA:  84 y/o M; laparoscopic partial colectomy 05/19/2018. Shortness of breath and congestion. EXAM: CHEST - 2 VIEW COMPARISON:  05/22/2018 chest radiograph FINDINGS: Stable normal cardiac silhouette given projection and technique. Post median sternotomy with wires in alignment. Aortic atherosclerosis with calcification. Clear lungs. No pleural effusion or pneumothorax. Pneumoperitoneum. No acute osseous abnormality is evident. IMPRESSION: 1. Pneumoperitoneum. 2. No acute pulmonary process identified. These results were called by telephone at the time of interpretation on 06/02/2018 at 5:46 pm to Dr. Vallery Ridge, who verbally acknowledged these results. Electronically Signed   By: Kristine Garbe M.D.   On: 06/02/2018 17:48   Dg Abd 1 View  Result Date: 05/22/2018 CLINICAL DATA:  Status post NG tube placement today. EXAM: ABDOMEN - 1 VIEW COMPARISON:  Plain films of the abdomen earlier today. FINDINGS: NG tube is in place with both the tip and side-port in the stomach. Diffuse gaseous distension of bowel is seen as on the prior exam. Surgical staples in the midline are noted. IMPRESSION: NG tube in good position. Mild diffuse gaseous distention of bowel likely due to ileus. Electronically Signed   By: Inge Rise M.D.   On: 05/22/2018 11:15   Dg Abd 1 View  Result Date: 05/22/2018 CLINICAL DATA:  Abdominal distention. Status post right colectomy for  obstructing hepatic flexure lesion of the colon. EXAM: ABDOMEN - 1 VIEW COMPARISON:  Radiographs dated 05/19/2018 and CT scan dated 05/18/2018 FINDINGS: There is increased air throughout the bowel with an increased number of slightly distended small bowel loops. Given the patient's recent surgery, this probably represents an ileus. The colon is not distended. IMPRESSION: Increased air throughout the bowel with an increased number slightly dilated small bowel loops, most likely ileus. Electronically Signed   By: Lorriane Shire M.D.   On: 05/22/2018 08:30   Ct Abdomen Pelvis W Contrast  Result Date: 05/18/2018 CLINICAL DATA:  84 year old male with history of bloating and constipation for the past 2 days. Emesis today. EXAM: CT ABDOMEN AND PELVIS WITH CONTRAST TECHNIQUE: Multidetector CT imaging of the abdomen and pelvis was performed using the standard protocol following bolus administration of intravenous contrast. CONTRAST:  163mL ISOVUE-300 IOPAMIDOL (ISOVUE-300) INJECTION 61% COMPARISON:  CT the abdomen and pelvis 05/22/2009. FINDINGS: Lower chest: Aortic atherosclerosis. Atherosclerotic calcifications noted in the left main, left anterior descending, left circumflex and right coronary arteries. Thickening calcification of the aortic valve. Lipomatous hypertrophy of the interatrial septum (normal anatomical variant) incidentally noted. Status post median sternotomy for CABG. Hepatobiliary: No cystic or solid hepatic lesions. No intra or extrahepatic biliary ductal dilatation. Gallbladder is normal in appearance. Pancreas: No pancreatic mass. No pancreatic ductal dilatation. No pancreatic or peripancreatic fluid or inflammatory changes. Spleen: Calcified granulomas throughout the spleen. Adrenals/Urinary Tract: 2.4 cm left adrenal nodule (axial image 35 of series 2), stable compared to remote prior examinations, previously characterized as an adenoma. Right adrenal gland is unremarkable. Multiple subcentimeter  low-attenuation lesions in the left kidney, too small to characterize, but statistically likely to represent tiny cysts. 1.4 cm simple cyst in the interpolar region of the left kidney. Vascular calcifications associated with both kidneys. Right kidney is otherwise unremarkable in appearance. No hydroureteronephrosis. Urinary bladder is normal in appearance. Stomach/Bowel: The appearance of the stomach is unremarkable. In the region of the hepatic flexure there is a mass-like area of mural thickening and luminal narrowing which measures approximately 4.7  x 4.5 x 4.7 cm (axial image 40 of series 2 and sagittal image 110 of series 6), highly concerning for primary colonic neoplasm. Slight haziness in the surrounding soft tissues is noted. Proximal to this there is dilatation of the cecum and ascending colon with liquid stool. More distal aspect of the colon and rectum are relatively decompressed. Multiple prominent borderline dilated loops of small bowel measuring up to 3.8 cm. Normal appendix. Vascular/Lymphatic: Aortic atherosclerosis, without evidence of aneurysm or dissection in the abdominal or pelvic vasculature. No lymphadenopathy noted in the abdomen or pelvis. Reproductive: Prostate gland and seminal vesicles are unremarkable in appearance. Other: No significant volume of ascites.  No pneumoperitoneum. Musculoskeletal: There are no aggressive appearing lytic or blastic lesions noted in the visualized portions of the skeleton. Median sternotomy wires. IMPRESSION: 1. There appears to be a nearly obstructing lesion in the region of the hepatic flexure of the colon which is highly concerning for primary colonic neoplasm. Slight haziness of the surrounding soft tissues may be indicative of early local invasion. This appears partially obstructive as evidenced by dilatation of more proximal aspects of the small bowel and colon. 2. Left adrenal lesion is stable compared to prior studies, previously characterized as an  adenoma. 3. Aortic atherosclerosis, in addition to left main and 3 vessel coronary artery disease. Status post median sternotomy for CABG. 4. Additional incidental findings, as above. Aortic Atherosclerosis (ICD10-I70.0). Electronically Signed   By: Vinnie Langton M.D.   On: 05/18/2018 22:07   Dg Chest Port 1 View  Result Date: 05/22/2018 CLINICAL DATA:  Status post NG tube placement today. Shortness of breath and abdominal pain. EXAM: PORTABLE CHEST 1 VIEW COMPARISON:  Plain films chest and abdomen 05/18/2018. FINDINGS: NG tube courses into the stomach and below the inferior margin of the film. Lungs are clear. Heart size is normal. Aortic atherosclerosis is noted. The patient is status post CABG. No pneumothorax or pleural effusion. No acute bony abnormality. IMPRESSION: NG tube courses into the stomach and below the inferior margin of the film. No acute disease. Atherosclerosis. Electronically Signed   By: Inge Rise M.D.   On: 05/22/2018 11:14   Dg Abdomen Acute W/chest  Result Date: 05/18/2018 CLINICAL DATA:  84 year old male with history of constipation, bloating and nausea for the past 4 days. EXAM: DG ABDOMEN ACUTE W/ 1V CHEST COMPARISON:  Chest x-ray 09/14/2017. FINDINGS: Lung volumes are normal. No consolidative airspace disease. No pleural effusions. No pneumothorax. No pulmonary nodule or mass noted. Pulmonary vasculature and the cardiomediastinal silhouette are within normal limits. Low aortic atherosclerosis. Status post median sternotomy. Multiple dilated loops of gas-filled small bowel are noted measuring up to 5.7 cm in diameter with several air-fluid levels noted on the upright projection. Gas and stool is noted throughout the colon extending to the level of the distal rectum. No pneumoperitoneum. IMPRESSION: 1. Nonspecific bowel gas pattern, as above. Findings could either reflect early or partial small bowel obstruction, or could be indicative of ileus. 2. No pneumoperitoneum. 3.  No radiographic evidence of acute cardiopulmonary disease. 4. Aortic atherosclerosis. Electronically Signed   By: Vinnie Langton M.D.   On: 05/18/2018 20:19   Dg Abd Portable 1v  Result Date: 05/26/2018 CLINICAL DATA:  Postop ileus EXAM: PORTABLE ABDOMEN - 1 VIEW COMPARISON:  05/25/2018 FINDINGS: NG tube remains present with the tip likely in the descending duodenum. Diffuse gaseous distention of bowel again noted, not significantly changed, likely ileus. No visible free air or organomegaly. IMPRESSION: Stable ileus pattern.  Electronically Signed   By: Rolm Baptise M.D.   On: 05/26/2018 08:54   Dg Abd Portable 1v  Result Date: 05/25/2018 CLINICAL DATA:  Postoperative ileus. EXAM: PORTABLE ABDOMEN - 1 VIEW COMPARISON:  Radiographs of January 22, 2018. FINDINGS: Nasogastric tube tip is seen in expected position of distal stomach. Continued presence of small bowel dilatation is noted without significant colonic dilatation. IMPRESSION: Continued small bowel dilatation is noted which may represent postoperative ileus, but follow-up radiographs are recommended to rule out distal small bowel obstruction. Electronically Signed   By: Marijo Conception, M.D.   On: 05/25/2018 09:28   Dg Abd Portable 1v  Result Date: 05/24/2018 CLINICAL DATA:  Abdominal distention, postop ileus EXAM: PORTABLE ABDOMEN - 1 VIEW COMPARISON:  05/23/2018 FINDINGS: NG tube has likely passed into the proximal descending duodenum. Continued diffuse gaseous distention of bowel, likely ileus. No visible free air organomegaly. IMPRESSION: Stable diffuse significant gaseous distention of bowel, likely ileus. Electronically Signed   By: Rolm Baptise M.D.   On: 05/24/2018 09:30   Dg Abd Portable 1v  Result Date: 05/23/2018 CLINICAL DATA:  Postoperative ileus EXAM: PORTABLE ABDOMEN - 1 VIEW COMPARISON:  05/22/2018 FINDINGS: NG tube tip remains in the stomach. Diffuse gaseous distention of bowel again noted, likely ileus, unchanged. IMPRESSION:  No significant change since prior study. Electronically Signed   By: Rolm Baptise M.D.   On: 05/23/2018 07:15   Dg Abd Portable 1v  Result Date: 05/19/2018 CLINICAL DATA:  84 year old male with a history surgery. Operative plain film for incomplete surgical instrument count EXAM: PORTABLE ABDOMEN - 1 VIEW COMPARISON:  05/18/2018 FINDINGS: Gas within small bowel, colon.  Borderline dilated small bowel. Surgical changes along the midline abdomen of laparotomy. Surgical changes of the right upper quadrant. Surgical changes of median sternotomy and CABG. No unexpected radiopaque foreign body. Calcifications of the vasculature. IMPRESSION: Plain film is negative for radiopaque foreign body. Surgical changes of the abdomen, as above. These results were called by telephone at the time of interpretation on 05/19/2018 at 12:48 pm to circulating team in OR 2, who verbally acknowledged these results. Electronically Signed   By: Corrie Mckusick D.O.   On: 05/19/2018 12:48     CBC Recent Labs  Lab 05/30/18 0406 06/02/18 1700 06/02/18 2355  WBC 8.5 9.7 9.3  HGB 11.4* 11.9* 11.0*  HCT 35.1* 36.9* 35.0*  PLT 286 425* 383  MCV 89.1 89.1 89.1  MCH 28.9 28.7 28.0  MCHC 32.5 32.2 31.4  RDW 14.4 14.2 14.2  LYMPHSABS  --  0.6*  --   MONOABS  --  0.9  --   EOSABS  --  0.0  --   BASOSABS  --  0.0  --     Chemistries  Recent Labs  Lab 05/30/18 0406 06/02/18 1700 06/02/18 1801 06/02/18 2355 06/03/18 0615 06/03/18 1458 06/04/18 0436  NA 139 141  --   --  141 141 143  K 3.1* 2.7*  --   --  2.7* 2.9* 3.3*  CL 101 95*  --   --  97* 98 98  CO2 31 31  --   --  33* 31 33*  GLUCOSE 128* 121*  --   --  123* 152* 148*  BUN 9 12  --   --  12 11 10   CREATININE 0.89 1.01  --  0.93 0.96 0.95 0.98  CALCIUM 8.0* 8.3*  --   --  8.0* 8.3* 8.3*  MG 1.7  --  1.7  --   --  1.8  --   AST  --  47*  --   --   --   --   --   ALT  --  34  --   --   --   --   --   ALKPHOS  --  121  --   --   --   --   --   BILITOT  --  0.5   --   --   --   --   --    ------------------------------------------------------------------------------------------------------------------ No results for input(s): CHOL, HDL, LDLCALC, TRIG, CHOLHDL, LDLDIRECT in the last 72 hours.  Lab Results  Component Value Date   HGBA1C 5.4 05/19/2018   ------------------------------------------------------------------------------------------------------------------ Recent Labs    06/03/18 0615  TSH 0.407   ------------------------------------------------------------------------------------------------------------------ No results for input(s): VITAMINB12, FOLATE, FERRITIN, TIBC, IRON, RETICCTPCT in the last 72 hours.  Coagulation profile Recent Labs  Lab 06/02/18 1801  INR 1.15    No results for input(s): DDIMER in the last 72 hours.  Cardiac Enzymes Recent Labs  Lab 06/02/18 2355 06/03/18 0615 06/03/18 1158  TROPONINI 0.03* 0.03* <0.03   ------------------------------------------------------------------------------------------------------------------    Component Value Date/Time   BNP 165.5 (H) 06/02/2018 1801     Derenda [place] M.D on 06/04/2018 at 2:32 PM  Pager [phone] Go to [email or web address] - password [email or web address] for contact info [facility] - Office  [phone]

## 2018-06-04 NOTE — Care Management Note (Addendum)
Case Management Note  Patient Details  Name: Vincent Mcclure MRN: [id number] Date of Birth: 04/11/1932  Subjective/Objective:       CHF, Hypokalemia             Action/Plan: NCM spoke to pt at bedside. States he lives at home with wife. He is active with [facility] RN and PT with [employer]. He has RW x2 at home. Notified [facility] of admission. Will need a resumption of care [facility] orders.    06/04/2018 255 pm Pt and wife requesting SNF rehab. CSW referral for SNF.   Expected Discharge Date:                  Expected Discharge Plan:  [facility]  In-House Referral:  NA  Discharge planning Services  CM Consult  Post Acute Care Choice:  Home Health, Resumption of Svcs/PTA Provider Choice offered to:  Patient  DME Arranged:  N/A DME Agency:  NA  HH Arranged:  PT, RN HH Agency:  [facility]  Status of Service:  In process, will continue to follow  If discussed at Long Length of Stay Meetings, dates discussed:    Additional Comments:  Erenest Rasher, RN 06/04/2018, 1:07 PM

## 2018-06-04 NOTE — Progress Notes (Signed)
Initial Nutrition Assessment  DOCUMENTATION CODES:   Obesity unspecified  INTERVENTION:   Will monitor for nutritional needs  NUTRITION DIAGNOSIS:   Increased nutrient needs related to chronic illness, cancer and cancer related treatments as evidenced by estimated needs.  GOAL:   Patient will meet greater than or equal to 90% of their needs  MONITOR:   PO intake, Labs, Weight trends, I & O's  REASON FOR ASSESSMENT:   Consult Enteral/tube feeding initiation and management  ASSESSMENT:   84 y.o. male with medical history significant of CAD, Colon Cancer, close intolerance, GI bleed with melena, iron deficiency anemia, history of alcohol abuse, Afib,  HTN,  Admitted for CHF exacerbation 8/9: s/p partial right colectomy  Patient currently consuming 90% of meals. Pt with increased needs given recent surgery.  Per weight records, weight is stable. Pt is on IV Lasix, weights may fluctuate.   Medications: IV Lasix BID, Multivitamin with minerals daily, KCl infusion   Labs reviewed: Low K   NUTRITION - FOCUSED PHYSICAL EXAM:  Nutrition focused physical exam shows no sign of depletion of muscle mass or body fat.  Diet Order:   Diet Order            Diet heart healthy/carb modified Room service appropriate? Yes; Fluid consistency: Thin  Diet effective now              EDUCATION NEEDS:   No education needs have been identified at this time  Skin:  Skin Assessment: Reviewed RN Assessment  Last BM:  8/24  Height:   Ht Readings from Last 1 Encounters:  06/03/18 5\' 11"  (1.803 m)    Weight:   Wt Readings from Last 1 Encounters:  06/04/18 100.5 kg    Ideal Body Weight:  78.2 kg  BMI:  Body mass index is 30.91 kg/m.  Estimated Nutritional Needs:   Kcal:  2100-2300  Protein:  95-105g  Fluid:  2L/day  Clayton Bibles, MS, RD, LDN Trainer Dietitian Pager: [phone] After Hours Pager: [phone]

## 2018-06-05 ENCOUNTER — Other Ambulatory Visit: Payer: Self-pay | Admitting: *Deleted

## 2018-06-05 DIAGNOSIS — J918 Pleural effusion in other conditions classified elsewhere: Secondary | ICD-10-CM | POA: Diagnosis present

## 2018-06-05 DIAGNOSIS — T8189XA Other complications of procedures, not elsewhere classified, initial encounter: Secondary | ICD-10-CM | POA: Diagnosis not present

## 2018-06-05 DIAGNOSIS — R0902 Hypoxemia: Secondary | ICD-10-CM | POA: Diagnosis not present

## 2018-06-05 DIAGNOSIS — C189 Malignant neoplasm of colon, unspecified: Secondary | ICD-10-CM | POA: Diagnosis not present

## 2018-06-05 DIAGNOSIS — I10 Essential (primary) hypertension: Secondary | ICD-10-CM | POA: Diagnosis not present

## 2018-06-05 DIAGNOSIS — Z66 Do not resuscitate: Secondary | ICD-10-CM | POA: Diagnosis not present

## 2018-06-05 DIAGNOSIS — R531 Weakness: Secondary | ICD-10-CM | POA: Diagnosis not present

## 2018-06-05 DIAGNOSIS — I110 Hypertensive heart disease with heart failure: Secondary | ICD-10-CM | POA: Diagnosis not present

## 2018-06-05 DIAGNOSIS — K9189 Other postprocedural complications and disorders of digestive system: Secondary | ICD-10-CM | POA: Diagnosis not present

## 2018-06-05 DIAGNOSIS — Z980 Intestinal bypass and anastomosis status: Secondary | ICD-10-CM | POA: Diagnosis not present

## 2018-06-05 DIAGNOSIS — Z9049 Acquired absence of other specified parts of digestive tract: Secondary | ICD-10-CM | POA: Diagnosis not present

## 2018-06-05 DIAGNOSIS — R1900 Intra-abdominal and pelvic swelling, mass and lump, unspecified site: Secondary | ICD-10-CM | POA: Diagnosis not present

## 2018-06-05 DIAGNOSIS — R0602 Shortness of breath: Secondary | ICD-10-CM | POA: Diagnosis not present

## 2018-06-05 DIAGNOSIS — R188 Other ascites: Secondary | ICD-10-CM | POA: Diagnosis not present

## 2018-06-05 DIAGNOSIS — I2510 Atherosclerotic heart disease of native coronary artery without angina pectoris: Secondary | ICD-10-CM | POA: Diagnosis not present

## 2018-06-05 DIAGNOSIS — D62 Acute posthemorrhagic anemia: Secondary | ICD-10-CM | POA: Diagnosis not present

## 2018-06-05 DIAGNOSIS — I5020 Unspecified systolic (congestive) heart failure: Secondary | ICD-10-CM | POA: Diagnosis not present

## 2018-06-05 DIAGNOSIS — I5033 Acute on chronic diastolic (congestive) heart failure: Secondary | ICD-10-CM | POA: Diagnosis not present

## 2018-06-05 DIAGNOSIS — R609 Edema, unspecified: Secondary | ICD-10-CM | POA: Diagnosis not present

## 2018-06-05 DIAGNOSIS — E8809 Other disorders of plasma-protein metabolism, not elsewhere classified: Secondary | ICD-10-CM | POA: Diagnosis present

## 2018-06-05 DIAGNOSIS — Z789 Other specified health status: Secondary | ICD-10-CM | POA: Diagnosis not present

## 2018-06-05 DIAGNOSIS — T8143XA Infection following a procedure, organ and space surgical site, initial encounter: Secondary | ICD-10-CM | POA: Diagnosis not present

## 2018-06-05 DIAGNOSIS — Y95 Nosocomial condition: Secondary | ICD-10-CM | POA: Diagnosis not present

## 2018-06-05 DIAGNOSIS — F1010 Alcohol abuse, uncomplicated: Secondary | ICD-10-CM | POA: Diagnosis present

## 2018-06-05 DIAGNOSIS — I959 Hypotension, unspecified: Secondary | ICD-10-CM | POA: Diagnosis not present

## 2018-06-05 DIAGNOSIS — M2550 Pain in unspecified joint: Secondary | ICD-10-CM | POA: Diagnosis not present

## 2018-06-05 DIAGNOSIS — Z978 Presence of other specified devices: Secondary | ICD-10-CM | POA: Diagnosis not present

## 2018-06-05 DIAGNOSIS — I272 Pulmonary hypertension, unspecified: Secondary | ICD-10-CM | POA: Diagnosis present

## 2018-06-05 DIAGNOSIS — R627 Adult failure to thrive: Secondary | ICD-10-CM | POA: Diagnosis present

## 2018-06-05 DIAGNOSIS — J189 Pneumonia, unspecified organism: Secondary | ICD-10-CM | POA: Diagnosis not present

## 2018-06-05 DIAGNOSIS — Z48815 Encounter for surgical aftercare following surgery on the digestive system: Secondary | ICD-10-CM | POA: Diagnosis not present

## 2018-06-05 DIAGNOSIS — I480 Paroxysmal atrial fibrillation: Secondary | ICD-10-CM | POA: Diagnosis not present

## 2018-06-05 DIAGNOSIS — Z4803 Encounter for change or removal of drains: Secondary | ICD-10-CM | POA: Diagnosis not present

## 2018-06-05 DIAGNOSIS — N401 Enlarged prostate with lower urinary tract symptoms: Secondary | ICD-10-CM | POA: Diagnosis not present

## 2018-06-05 DIAGNOSIS — C182 Malignant neoplasm of ascending colon: Secondary | ICD-10-CM | POA: Diagnosis not present

## 2018-06-05 DIAGNOSIS — J90 Pleural effusion, not elsewhere classified: Secondary | ICD-10-CM | POA: Diagnosis not present

## 2018-06-05 DIAGNOSIS — R0600 Dyspnea, unspecified: Secondary | ICD-10-CM | POA: Diagnosis not present

## 2018-06-05 DIAGNOSIS — J9601 Acute respiratory failure with hypoxia: Secondary | ICD-10-CM | POA: Diagnosis not present

## 2018-06-05 DIAGNOSIS — J440 Chronic obstructive pulmonary disease with acute lower respiratory infection: Secondary | ICD-10-CM | POA: Diagnosis not present

## 2018-06-05 DIAGNOSIS — K651 Peritoneal abscess: Secondary | ICD-10-CM | POA: Diagnosis not present

## 2018-06-05 DIAGNOSIS — R1011 Right upper quadrant pain: Secondary | ICD-10-CM | POA: Diagnosis not present

## 2018-06-05 DIAGNOSIS — T8131XS Disruption of external operation (surgical) wound, not elsewhere classified, sequela: Secondary | ICD-10-CM | POA: Diagnosis not present

## 2018-06-05 DIAGNOSIS — T8132XD Disruption of internal operation (surgical) wound, not elsewhere classified, subsequent encounter: Secondary | ICD-10-CM | POA: Diagnosis not present

## 2018-06-05 DIAGNOSIS — D630 Anemia in neoplastic disease: Secondary | ICD-10-CM | POA: Diagnosis present

## 2018-06-05 DIAGNOSIS — R091 Pleurisy: Secondary | ICD-10-CM | POA: Diagnosis not present

## 2018-06-05 DIAGNOSIS — J9602 Acute respiratory failure with hypercapnia: Secondary | ICD-10-CM | POA: Diagnosis not present

## 2018-06-05 DIAGNOSIS — R410 Disorientation, unspecified: Secondary | ICD-10-CM

## 2018-06-05 DIAGNOSIS — R109 Unspecified abdominal pain: Secondary | ICD-10-CM | POA: Diagnosis not present

## 2018-06-05 DIAGNOSIS — A419 Sepsis, unspecified organism: Secondary | ICD-10-CM | POA: Diagnosis not present

## 2018-06-05 DIAGNOSIS — Z7401 Bed confinement status: Secondary | ICD-10-CM | POA: Diagnosis not present

## 2018-06-05 DIAGNOSIS — R079 Chest pain, unspecified: Secondary | ICD-10-CM | POA: Diagnosis not present

## 2018-06-05 DIAGNOSIS — Z9889 Other specified postprocedural states: Secondary | ICD-10-CM | POA: Diagnosis not present

## 2018-06-05 DIAGNOSIS — G8918 Other acute postprocedural pain: Secondary | ICD-10-CM | POA: Diagnosis not present

## 2018-06-05 DIAGNOSIS — K219 Gastro-esophageal reflux disease without esophagitis: Secondary | ICD-10-CM | POA: Diagnosis not present

## 2018-06-05 DIAGNOSIS — Z951 Presence of aortocoronary bypass graft: Secondary | ICD-10-CM | POA: Diagnosis not present

## 2018-06-05 DIAGNOSIS — F05 Delirium due to known physiological condition: Secondary | ICD-10-CM

## 2018-06-05 DIAGNOSIS — K668 Other specified disorders of peritoneum: Secondary | ICD-10-CM | POA: Diagnosis present

## 2018-06-05 DIAGNOSIS — E876 Hypokalemia: Secondary | ICD-10-CM | POA: Diagnosis not present

## 2018-06-05 DIAGNOSIS — I5031 Acute diastolic (congestive) heart failure: Secondary | ICD-10-CM | POA: Diagnosis not present

## 2018-06-05 DIAGNOSIS — I4891 Unspecified atrial fibrillation: Secondary | ICD-10-CM | POA: Diagnosis not present

## 2018-06-05 LAB — BASIC METABOLIC PANEL
Anion gap: 9 (ref 5–15)
BUN: 11 mg/dL (ref 8–23)
CALCIUM: 8.3 mg/dL — AB (ref 8.9–10.3)
CHLORIDE: 97 mmol/L — AB (ref 98–111)
CO2: 35 mmol/L — ABNORMAL HIGH (ref 22–32)
CREATININE: 0.89 mg/dL (ref 0.61–1.24)
GFR calc non Af Amer: >60 mL/min (ref >60)
Glucose, Bld: 142 mg/dL — ABNORMAL HIGH (ref 70–99)
Potassium: 3.5 mmol/L (ref 3.5–5.1)
SODIUM: 141 mmol/L (ref 135–145)

## 2018-06-05 LAB — MAGNESIUM: MAGNESIUM: 2.4 mg/dL (ref 1.7–2.4)

## 2018-06-05 LAB — CBC
HEMATOCRIT: 32.2 % — AB (ref 39.0–52.0)
Hemoglobin: 10.3 g/dL — ABNORMAL LOW (ref 13.0–17.0)
MCH: 28.9 pg (ref 26.0–34.0)
MCHC: 32 g/dL (ref 30.0–36.0)
MCV: 90.2 fL (ref 78.0–100.0)
PLATELETS: 391 10*3/uL (ref 150–400)
RBC: 3.57 MIL/uL — ABNORMAL LOW (ref 4.22–5.81)
RDW: 14.3 % (ref 11.5–15.5)
WBC: 7.1 10*3/uL (ref 4.0–10.5)

## 2018-06-05 MED ORDER — POLYETHYLENE GLYCOL 3350 17 G PO PACK
17.0000 g | PACK | Freq: Every day | ORAL | Status: DC
  Administered 2018-06-07 – 2018-06-22 (×10): 17 g via ORAL
  Filled 2018-06-05 (×15): qty 1

## 2018-06-05 MED ORDER — FUROSEMIDE 40 MG PO TABS: 40.0000 mg | ORAL_TABLET | Freq: Every day | ORAL | Status: DC

## 2018-06-05 MED ORDER — PRAVASTATIN SODIUM 40 MG PO TABS
40.0000 mg | ORAL_TABLET | Freq: Every day | ORAL | Status: DC
  Administered 2018-06-05 – 2018-06-21 (×17): 40 mg via ORAL
  Filled 2018-06-05 (×17): qty 1

## 2018-06-05 MED ORDER — GABAPENTIN 100 MG PO CAPS
100.0000 mg | ORAL_CAPSULE | Freq: Two times a day (BID) | ORAL | Status: DC
  Administered 2018-06-05 – 2018-06-22 (×34): 100 mg via ORAL
  Filled 2018-06-05 (×34): qty 1

## 2018-06-05 MED ORDER — ASPIRIN EC 81 MG PO TBEC
81.0000 mg | DELAYED_RELEASE_TABLET | Freq: Every day | ORAL | Status: DC
  Administered 2018-06-05 – 2018-06-22 (×16): 81 mg via ORAL
  Filled 2018-06-05 (×19): qty 1

## 2018-06-05 MED ORDER — FERROUS SULFATE 325 (65 FE) MG PO TABS
325.0000 mg | ORAL_TABLET | Freq: Three times a day (TID) | ORAL | Status: DC
  Filled 2018-06-05 (×4): qty 1

## 2018-06-05 MED ORDER — IPRATROPIUM BROMIDE 0.02 % IN SOLN: 0.5000 mg | RESPIRATORY_TRACT | Status: DC | PRN

## 2018-06-05 NOTE — Progress Notes (Signed)
Physical Therapy Treatment Patient Details Name: Vincent Mcclure MRN: [id number] DOB: May 19, 1932 Today's Date: 06/05/2018    History of Present Illness 84 y.o. male with medical history significant of CAD s/p CABG in 2018, PAF not on anticoagulation due to h/o GIB in past, and s/p exp lap with colectomy 05/19/18.  He was admitted from his follow up surgical appt due to weakness and congestion.     PT Comments    Assisted OOB to amb a limited distance due to dyspnea and weakness.  General Gait Details: decreased amb distance/tolerance this session with HR increased to 138 and RA decreased to 81% with 3/4 dyspnea.  Also noted upper respiratory congestion/coughing  Pt present with gait instability, weakness and limited endurance.   Pt will need ST Rehab at SNF prior to returning home   Follow Up Recommendations  SNF     Equipment Recommendations  None recommended by PT    Recommendations for Other Services       Precautions / Restrictions Precautions Precautions: Fall;Other (comment) Precaution Comments: watch HR and sats Restrictions Weight Bearing Restrictions: No    Mobility  Bed Mobility Overal bed mobility: Needs Assistance Bed Mobility: Supine to Sit     Supine to sit: Min assist Sit to supine: Mod assist   General bed mobility comments: increased assist back to bed to support B LE's due to recent ABD surgery   Transfers Overall transfer level: Needs assistance Equipment used: Rolling walker (2 wheeled) Transfers: Sit to/from Stand Sit to Stand: Min assist;Mod assist Stand pivot transfers: Min assist;Mod assist       General transfer comment: assist to power up, cues for sequencing; steadying assist provided for transfer to recliner   Ambulation/Gait Ambulation/Gait assistance: Min assist Gait Distance (Feet): 45 Feet Assistive device: Rolling walker (2 wheeled) Gait Pattern/deviations: Step-through pattern Gait velocity: decreased    General Gait Details:  decreased amb distance/tolerance this session with HR increased to 138 and RA decreased to 81% with 3/4 dyspnea.  Also noted upper respiratory congestion/coughing   Stairs             Wheelchair Mobility    Modified Rankin (Stroke Patients Only)       Balance                                            Cognition Arousal/Alertness: Awake/alert Behavior During Therapy: WFL for tasks assessed/performed Overall Cognitive Status: Within Functional Limits for tasks assessed Area of Impairment: Following commands;Safety/judgement                       Following Commands: Follows one step commands with increased time;Follows one step commands consistently Safety/Judgement: Decreased awareness of deficits   Problem Solving: Slow processing General Comments: pleasant      Exercises      General Comments        Pertinent Vitals/Pain Pain Assessment: Faces Faces Pain Scale: Hurts a little bit Pain Location: ABD incision Pain Descriptors / Indicators: Tightness;Discomfort    Home Living Family/patient expects to be discharged to:: Private residence Living Arrangements: Spouse/significant other Available Help at Discharge: Available 24 hours/day;Family Type of Home: House Home Access: Ramped entrance   Home Layout: One level Home Equipment: [place] - 2 wheels;Cane - single point      Prior Function Level of Independence: Independent with assistive device(s)  Comments: uses cane at times;    PT Goals (current goals can now be found in the care plan section) Acute Rehab PT Goals Patient Stated Goal: get stronger Progress towards PT goals: Progressing toward goals    Frequency    Min 3X/week      PT Plan Current plan remains appropriate    Co-evaluation              AM-PAC PT "6 Clicks" Daily Activity  Outcome Measure  Difficulty turning over in bed (including adjusting bedclothes, sheets and blankets)?: A  Lot Difficulty moving from lying on back to sitting on the side of the bed? : A Lot Difficulty sitting down on and standing up from a chair with arms (e.g., wheelchair, bedside commode, etc,.)?: A Lot Help needed moving to and from a bed to chair (including a wheelchair)?: A Lot Help needed walking in hospital room?: A Lot Help needed climbing 3-5 steps with a railing? : Total 6 Click Score: 11    End of Session Equipment Utilized During Treatment: Gait belt Activity Tolerance: Patient tolerated treatment well Patient left: in bed;with family/visitor present;with call bell/phone within reach Nurse Communication: Mobility status PT Visit Diagnosis: Unsteadiness on feet (R26.81);Muscle weakness (generalized) (M62.81);History of falling (Z91.81)     Time: 1530-1600 PT Time Calculation (min) (ACUTE ONLY): 30 min  Charges:  $Gait Training: 8-22 mins $Therapeutic Activity: 8-22 mins                     Rica Koyanagi  PTA [facility]  Acute  Rehab Pager      [phone]

## 2018-06-05 NOTE — Progress Notes (Signed)
PHYSICAL THERAPY  SATURATION QUALIFICATIONS: (This note is used to comply with regulatory documentation for home oxygen)  Patient Saturations on Room Air at Rest = 90%  Patient Saturations on Room Air while Ambulating = 81%  With 3/4 dyspnea HR increased to 138   Patient Saturations on 2 Liters of oxygen while Ambulating 50 feet = 90%  Please briefly explain why patient needs home oxygen: pt required supplemental oxygen to achieve therapeutic levels  Rica Koyanagi  PTA [facility]  Acute  Rehab Pager      [phone]

## 2018-06-05 NOTE — Progress Notes (Signed)
Pt agreed to pursue SNF placement and would prefer [facility]. Made referral and initiated [place] authorization through [place]. Full assessment to follow.  Sharren Bridge, MSW, LCSW Clinical Social Work 06/05/2018 [phone]

## 2018-06-05 NOTE — NC FL2 (Signed)
[place] MEDICAID FL2 LEVEL OF CARE SCREENING TOOL     IDENTIFICATION  Patient Name: Vincent Mcclure Birthdate: October 29, 1931 Sex: male Admission Date (Current Location): 06/02/2018  [facility] and [place] Number:  Herbalist and Address:  [facility],  [place] [address] [place]      Provider Number: [id number]  Attending Physician Name and Address:  Michael Boston, MD  Relative Name and Phone Number:       Current Level of Care: Hospital Recommended Level of Care: [place] Prior Approval Number:    Date Approved/Denied:   PASRR Number: [id number]  Discharge Plan: SNF    Current Diagnoses: Patient Active Problem List   Diagnosis Date Noted  . Subacute confusional state 06/05/2018  . Hypoalbuminemia 06/03/2018  . Congestive heart failure (CHF) ([place]) 06/02/2018  . Hypokalemia 06/02/2018  . Atrial fibrillation with RVR ([place]) 06/02/2018  . Cancer of ascending colon [id number] s/p right colectomy 05/19/2018 05/24/2018  . CAD (coronary artery disease), native coronary artery 03/14/2017  . PAF (paroxysmal atrial fibrillation) ([place])   . 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 s/p NSTEMI (non-ST elevated myocardial infarction); with subsequent urgent CABG  5/18 ([place]) 02/12/2017  . Chest pain 02/11/2017  . Overweight (BMI 25.0-29.9) 09/29/2016  . Alcohol use/ h/o heavy abuse 08/11/2016  . Left shoulder pain 01/24/2015  . h/o Anemia due to blood loss, acute 11/14/2014  . Eye pain   . Acute esophagitis   . Acute gastric ulcer   . Duodenal ulcer disease   . Upper GI bleed 11/13/2014  . Lumbar disc disease 01/30/2014  . Hearing loss 03/23/2013  . Screening for other and unspecified cardiovascular conditions 03/23/2013  . PAD (peripheral artery disease) ([place]) 10/14/2011  . Chronic low back pain 10/14/2011  . Glucose intolerance (impaired glucose tolerance) 10/12/2011  . Preventative health care 10/12/2011  . LEG CRAMPS 09/02/2010  . PERIPHERAL  EDEMA 09/02/2010  . VARICOSE VEINS, LOWER EXTREMITIES 06/09/2009  . HLD (hyperlipidemia) 03/05/2008  . Anemia, iron deficiency 03/05/2008  . Hereditary and idiopathic peripheral neuropathy 03/05/2008  . HTN (hypertension) 03/05/2008  . GERD 03/05/2008  . IBS 03/05/2008  . Blood in stool 03/05/2008  . COLONIC POLYPS, HX OF 03/05/2008    Orientation RESPIRATION BLADDER Height & Weight     Self, Time, Situation, Place  O2(2L) Continent Weight: 221 lb 1.9 oz (100.3 kg) Height:  5\' 11"  (180.3 cm)  BEHAVIORAL SYMPTOMS/MOOD NEUROLOGICAL BOWEL NUTRITION STATUS      Continent Diet(carb modified diet)  AMBULATORY STATUS COMMUNICATION OF NEEDS Skin   Limited Assist Verbally Surgical wounds(closed incision abdomen)                       Personal Care Assistance Level of Assistance  Bathing, Feeding, Dressing Bathing Assistance: Limited assistance Feeding assistance: Independent Dressing Assistance: Independent     Functional Limitations Info  Sight, Hearing, Speech Sight Info: Adequate Hearing Info: Impaired Speech Info: Adequate    SPECIAL CARE FACTORS FREQUENCY  PT (By licensed PT), OT (By licensed OT)     PT Frequency: 5x OT Frequency: 5x            Contractures Contractures Info: Not present    Additional Factors Info  Code Status, Allergies Code Status Info: full code Allergies Info: nka           Current Medications (06/05/2018):  This is the current hospital active medication list Current Facility-Administered Medications  Medication Dose Route  Frequency Provider Last Rate Last Dose  . 0.9 %  sodium chloride infusion   Intravenous PRN Armandina Gemma, MD 10 mL/hr at 06/03/18 0939 250 mL at 06/03/18 0939  . aspirin EC tablet 81 mg  81 mg Oral Daily Michael Boston, MD   81 mg at 06/05/18 1350  . enalapril (VASOTEC) tablet 2.5 mg  2.5 mg Oral BID Doutova, Anastassia, MD   2.5 mg at 06/05/18 1104  . enoxaparin (LOVENOX) injection 40 mg  40 mg Subcutaneous QHS  Armandina Gemma, MD   40 mg at 06/04/18 2220  . ferrous sulfate tablet 325 mg  325 mg Oral TID WC Michael Boston, MD      . furosemide (LASIX) injection 40 mg  40 mg Intravenous Daily Colbert Ewing, MD   40 mg at 06/05/18 0442  . gabapentin (NEURONTIN) capsule 100 mg  100 mg Oral BID Michael Boston, MD      . ipratropium (ATROVENT) nebulizer solution 0.5 mg  0.5 mg Nebulization Q4H PRN Michael Boston, MD      . LORazepam (ATIVAN) injection 1 mg  1 mg Intravenous Q4H PRN Emokpae, Courage, MD      . metoprolol succinate (TOPROL-XL) 24 hr tablet 50 mg  50 mg Oral Daily Emokpae, Courage, MD   50 mg at 06/05/18 1106  . multivitamin with minerals tablet 1 tablet  1 tablet Oral Daily Roxan Hockey, MD   1 tablet at 06/05/18 1105  . ondansetron (ZOFRAN-ODT) disintegrating tablet 4 mg  4 mg Oral Q6H PRN Armandina Gemma, MD       Or  . ondansetron (ZOFRAN) injection 4 mg  4 mg Intravenous Q6H PRN Armandina Gemma, MD      . pantoprazole (PROTONIX) EC tablet 40 mg  40 mg Oral Daily Emokpae, Courage, MD   40 mg at 06/05/18 1106  . polyethylene glycol (MIRALAX / GLYCOLAX) packet 17 g  17 g Oral Daily Gross, Remo Lipps, MD      . pravastatin (PRAVACHOL) tablet 40 mg  40 mg Oral Ardeen Fillers, MD         Discharge Medications: Please see discharge summary for a list of discharge medications.  Relevant Imaging Results:  Relevant Lab Results:   Additional Information SS#: [id number]  Nila Nephew, LCSW

## 2018-06-05 NOTE — Evaluation (Signed)
Occupational Therapy Evaluation Patient Details Name: Vincent Mcclure MRN: [id number] DOB: [birth year] Today's Date: 06/05/2018    History of Present Illness 84 y.o. male with medical history significant of CAD s/p CABG in 2018, PAF not on anticoagulation due to h/o GIB in past, and s/p exp lap with colectomy 05/19/18.  He was admitted from his follow up surgical appt due to weakness and congestion.    Clinical Impression   This 84 y/o male presents with the above. At baseline pt reports mod independence with ADLs and functional mobility. Pt demonstrating stand pivot transfers this session with minA (HHA); currently requires minguard assist for UB ADL, modA for LB ADLs. Pt on RA during session with SpO2 remaining at 90% and above with activity. Pt with fluctuating HR during session and with HR noted up to 135 with minimal activity. Spouse present end of session and reporting concerns being able to provide necessary assist at time of discharge. Pt will benefit from continued acute OT services and recommend follow up therapy services in SNF setting prior to return home to maximize his safety and independence with ALDs and mobility as well as to decrease level of caregiver burden. Will follow.     Follow Up Recommendations  SNF;Supervision/Assistance - 24 hour    Equipment Recommendations  3 in 1 bedside commode;Other (comment)(TBD in next venue )           Precautions / Restrictions Precautions Precautions: Fall;Other (comment) Precaution Comments: watch HR and sats Restrictions Weight Bearing Restrictions: No      Mobility Bed Mobility Overal bed mobility: Needs Assistance Bed Mobility: Supine to Sit     Supine to sit: Min assist     General bed mobility comments: +rail, assist to elevate trunk  Transfers Overall transfer level: Needs assistance Equipment used: 1 person hand held assist Transfers: Sit to/from Stand;Stand Pivot Transfers Sit to Stand: Min assist Stand pivot  transfers: Min assist       General transfer comment: assist to power up, cues for sequencing; steadying assist provided for transfer to recliner                                                ADL either performed or assessed with clinical judgement   ADL Overall ADL's : Needs assistance/impaired Eating/Feeding: Independent;Sitting   Grooming: Set up;Sitting;Wash/dry face   Upper Body Bathing: Min guard;Sitting   Lower Body Bathing: Minimal assistance;Sit to/from stand   Upper Body Dressing : Min guard;Sitting   Lower Body Dressing: Moderate assistance;Sit to/from stand Lower Body Dressing Details (indicate cue type and reason): increased difficulty reaching towards LEs due to abdominal pain  Toilet Transfer: Minimal assistance;Stand-pivot;BSC Toilet Transfer Details (indicate cue type and reason): simulated in transfer to [address] and Hygiene: Minimal assistance;Sit to/from stand       Functional mobility during ADLs: Minimal assistance General ADL Comments: pt requiring minA for stand pivot transfers, HR fluctuating and noted highest HR up to 135 with minimal activity. Pt on RA during session with lowest SpO2 90%. Spouse present end of session and expressing concerns with ability to care for spouse at home                          Pertinent Vitals/Pain Pain Assessment: No/denies pain     Hand  Dominance     Extremity/Trunk Assessment Upper Extremity Assessment Upper Extremity Assessment: Generalized weakness   Lower Extremity Assessment Lower Extremity Assessment: Defer to PT evaluation       Communication Communication Communication: HOH   Cognition Arousal/Alertness: Awake/alert Behavior During Therapy: WFL for tasks assessed/performed Overall Cognitive Status: Impaired/Different from baseline Area of Impairment: Following commands;Safety/judgement                       Following  Commands: Follows one step commands with increased time;Follows one step commands consistently Safety/Judgement: Decreased awareness of deficits   Problem Solving: Slow processing General Comments: pleasant and friendly, decreased awareness of need for assist with functional tasks but pt does report awareness of requiring increased time to process instructions/information    General Comments       Exercises     Shoulder Instructions      Home Living Family/patient expects to be discharged to:: Private residence Living Arrangements: Spouse/significant other Available Help at Discharge: Available 24 hours/day;Family Type of Home: House Home Access: Ramped entrance     Home Layout: One level         Bathroom Toilet: [place]: Environmental consultant - 2 wheels;Cane - single point          Prior Functioning/Environment Level of Independence: Independent with assistive device(s)        Comments: uses cane at times;         OT Problem List: Decreased strength;Impaired balance (sitting and/or standing);Decreased activity tolerance;Decreased cognition      OT Treatment/Interventions: Self-care/ADL training;DME and/or AE instruction;Therapeutic activities;Balance training;Therapeutic exercise;Patient/family education    OT Goals(Current goals can be found in the care plan section) Acute Rehab OT Goals Patient Stated Goal: get stronger OT Goal Formulation: With patient Time For Goal Achievement: 06/19/18 Potential to Achieve Goals: Good  OT Frequency: Min 2X/week   Barriers to D/C:            Co-evaluation              AM-PAC PT "6 Clicks" Daily Activity     Outcome Measure Help from another person eating meals?: None Help from another person taking care of personal grooming?: A Little Help from another person toileting, which includes using toliet, bedpan, or urinal?: A Little Help from another person bathing (including washing, rinsing, drying)?: A  Lot Help from another person to put on and taking off regular upper body clothing?: A Little Help from another person to put on and taking off regular lower body clothing?: A Lot 6 Click Score: 17   End of Session Equipment Utilized During Treatment: Gait belt Nurse Communication: Mobility status  Activity Tolerance: Patient tolerated treatment well Patient left: in chair;with call bell/phone within reach;with family/visitor present  OT Visit Diagnosis: Muscle weakness (generalized) (M62.81)                Time: 1884-1660 OT Time Calculation (min): 21 min Charges:  OT General Charges $OT Visit: 1 Visit OT Evaluation $OT Eval Moderate Complexity: 1 Mod  Lou Cal, OT Pager [phone] 06/05/2018   Raymondo Band 06/05/2018, 2:07 PM

## 2018-06-05 NOTE — Plan of Care (Signed)
Pt is using his IS actively.

## 2018-06-05 NOTE — Progress Notes (Signed)
CC:  SOB, confused and unable to get out of bed or chair.  Subjective: His wife is with him and she reports that she has not been able to get him up and around since discharge.  He has been short of breath and fluid overloaded since discharge.  She also reports he is been confused at times.  He is also definitely hard of hearing.  Currently he thinks his breathing is better but he has rhonchi and some rales that persist.  He remains on O2.  He currently has PT and OT working with him.  He thinks his breathing is better.  His weight is gone from 106.2 kg down to 100.3 kg, since admission, 06/02/2018.  Objective: Vital signs in last 24 hours: Temp:  [98 F (36.7 C)-99.1 F (37.3 C)] 99.1 F (37.3 C) (08/26 0529) Pulse Rate:  [93-105] 105 (08/26 0529) Resp:  [18-26] 18 (08/26 0529) BP: (108-119)/(66-80) 119/68 (08/26 0529) SpO2:  [90 %-98 %] 90 % (08/26 0858) Weight:  [100.3 kg] 100.3 kg (08/26 0700) Last BM Date: 06/04/18 Afebrile/tachycardic, blood pressure stable. Hypokalemia  - being replaced -mag 2.4 Glucose mildly elevated 142-152 range WBC stable H/H slightly down with hydration 380 PO 50 IV 1450 urine Stool x 1 Telemetry shows atrial fibrillation with PVCs/paired PVCs. Intake/Output from previous day: 08/25 0701 - 08/26 0700 In: 430 [P.O.:380; IV Piggyback:50] Out: 1450 [Urine:1450] Intake/Output this shift: Total I/O In: -  Out: 375 [Urine:375]  General appearance: alert, cooperative and no distress Resp: Gross rhonchi, upper airway congestion, some rales. GI: Soft, not distended, tolerating p.o.'s but poor p.o. intake, positive bowel function.  Abdominal wound is open and packed with Kerlix gauze.  Sutures at the base are loose but the fascia appears to be intact. Extremities: Trace edema.  Lab Results:  Recent Labs    06/02/18 2355 06/05/18 0613  WBC 9.3 7.1  HGB 11.0* 10.3*  HCT 35.0* 32.2*  PLT 383 391    BMET Recent Labs    06/04/18 0436  06/05/18 0613  NA 143 141  K 3.3* 3.5  CL 98 97*  CO2 33* 35*  GLUCOSE 148* 142*  BUN 10 11  CREATININE 0.98 0.89  CALCIUM 8.3* 8.3*   PT/INR Recent Labs    06/02/18 1801  LABPROT 14.6  INR 1.15    Recent Labs  Lab 06/02/18 1700  AST 47*  ALT 34  ALKPHOS 121  BILITOT 0.5  PROT 5.7*  ALBUMIN 2.3*     Lipase     Component Value Date/Time   LIPASE 103 (H) 06/02/2018 1801     Medications: . enalapril  2.5 mg Oral BID  . enoxaparin (LOVENOX) injection  40 mg Subcutaneous QHS  . furosemide  40 mg Intravenous Daily  . gabapentin  100 mg Oral BID  . ipratropium  0.5 mg Nebulization TID  . metoprolol succinate  50 mg Oral Daily  . multivitamin with minerals  1 tablet Oral Daily  . pantoprazole  40 mg Oral Daily   . sodium chloride 250 mL (06/03/18 0939)   Anti-infectives (From admission, onward)   None    Opalski, Deborah, DO  Assessment/Plan  Obstructing colon mass/adenocarcinoma 1/8 positive nodes Oncology following Postop ileus - discharge 05/30/2018 S/P exploratory laparotomy, right hemicolectomy with primary anastomosis, 05/19/2018, Dr. Fanny Skates  Readmit 03/07/7823 Acute diastolic congestive heart failure exacerbation -postop Atrial fibrillation with history of RVR -no anticoagulation secondary to prior bleeding History of alcohol use Failure to thrive/deconditioning Hx  of hypertension Hx of hyperlipidemia   FEN:  IV@KVO /Heart healthy diet ID:  None Follow up:  Dr. Waynetta Pean, DO - PCP DVT:  Lovenox  Plan: I am going to add incentive spirometry and a flutter valve.  Will defer to medicine on continued IV Lasix for fluid overload.  He has PT and OT working with him now.  Recommendation on 06/04/2018 by physical therapy was for skilled nursing facility.  Wife is extremely anxious they have already told her she "does not qualify for skilled nursing facility," because they are on observation status.  Today is day 3 of his readmission.  He  is clearly not ready for discharge.  Appreciate medicine's assistance.  We discussed this earlier and we may need to reconsider anticoagulation for his atrial fibrillation.  We may need to ask cardiology to see again order to make sure that he has consistent follow-up with this aspect of his care.  Nutrition consult with dietitian to help with p.o. intake.  LOS: 0 days    Sinda Leedom 06/05/2018 [phone]

## 2018-06-05 NOTE — Progress Notes (Signed)
Nutrition Brief Note  RD consulted for nutritional assessment and to assist in protein supplementation.  Initial assessment completed 8/25.  Per chart review today, pt is more confused today.  Weight loss is more likely a result of IV Lasix.  RD placed order for [employer] with meals.  Will continue to monitor PO intakes.  Clayton Bibles, MS, RD, [place] Dietitian Pager: [phone] After Hours Pager: [phone]

## 2018-06-06 ENCOUNTER — Encounter: Payer: Self-pay | Admitting: *Deleted

## 2018-06-06 ENCOUNTER — Inpatient Hospital Stay (HOSPITAL_COMMUNITY): Payer: Medicare HMO

## 2018-06-06 DIAGNOSIS — E876 Hypokalemia: Secondary | ICD-10-CM

## 2018-06-06 DIAGNOSIS — I509 Heart failure, unspecified: Secondary | ICD-10-CM

## 2018-06-06 LAB — BASIC METABOLIC PANEL
Anion gap: 7 (ref 5–15)
BUN: 13 mg/dL (ref 8–23)
CALCIUM: 8.4 mg/dL — AB (ref 8.9–10.3)
CO2: 33 mmol/L — ABNORMAL HIGH (ref 22–32)
Chloride: 100 mmol/L (ref 98–111)
Creatinine, Ser: 0.94 mg/dL (ref 0.61–1.24)
Glucose, Bld: 149 mg/dL — ABNORMAL HIGH (ref 70–99)
POTASSIUM: 3.6 mmol/L (ref 3.5–5.1)
SODIUM: 140 mmol/L (ref 135–145)

## 2018-06-06 LAB — CBC
HCT: 33 % — ABNORMAL LOW (ref 39.0–52.0)
HEMOGLOBIN: 10.3 g/dL — AB (ref 13.0–17.0)
MCH: 28.1 pg (ref 26.0–34.0)
MCHC: 31.2 g/dL (ref 30.0–36.0)
MCV: 89.9 fL (ref 78.0–100.0)
PLATELETS: 435 10*3/uL — AB (ref 150–400)
RBC: 3.67 MIL/uL — AB (ref 4.22–5.81)
RDW: 14.5 % (ref 11.5–15.5)
WBC: 10.3 10*3/uL (ref 4.0–10.5)

## 2018-06-06 LAB — MAGNESIUM: MAGNESIUM: 2.1 mg/dL (ref 1.7–2.4)

## 2018-06-06 MED ORDER — DM-GUAIFENESIN ER 30-600 MG PO TB12
1.0000 | ORAL_TABLET | Freq: Two times a day (BID) | ORAL | Status: DC
  Administered 2018-06-06 – 2018-06-20 (×29): 1 via ORAL
  Filled 2018-06-06 (×29): qty 1

## 2018-06-06 MED ORDER — APIXABAN 5 MG PO TABS
5.0000 mg | ORAL_TABLET | Freq: Two times a day (BID) | ORAL | Status: DC
  Administered 2018-06-06 – 2018-06-07 (×2): 5 mg via ORAL
  Filled 2018-06-06 (×2): qty 1

## 2018-06-06 MED ORDER — POTASSIUM CHLORIDE CRYS ER 20 MEQ PO TBCR
40.0000 meq | EXTENDED_RELEASE_TABLET | Freq: Every day | ORAL | Status: DC
  Administered 2018-06-06: 40 meq via ORAL
  Filled 2018-06-06: qty 2

## 2018-06-06 MED ORDER — FUROSEMIDE 10 MG/ML IJ SOLN
40.0000 mg | Freq: Two times a day (BID) | INTRAMUSCULAR | Status: DC
  Administered 2018-06-06 – 2018-06-08 (×5): 40 mg via INTRAVENOUS
  Filled 2018-06-06 (×5): qty 4

## 2018-06-06 NOTE — Consult Note (Addendum)
   [facility] CM Inpatient Consult   06/06/2018  ZYKEE AVAKIAN Jun 14, 1932 [id number]   Patient recently assigned to [facility] NP due to referral from [facility] for [place] [facility] services.   Went to bedside to speak with patient and wife about [place] [facility] program. Mr. Biederman was resting and wife states he can be forgetful. Mrs. Pullin is agreeable to [place] follow up and [facility] written consent obtained. [facility] folder provided.   Mrs. Kistler indicates the discharge plan is for SNF and they are awaiting insurance authorization. Explained that [place] Management will not interfere or replace any services Mr. Linhardt will have at discharge, including SNF.   Confirmed that Mrs. Caperton should be contacted for post discharge calls at home [phone] or cell [phone].   Mrs. Souter expresses appreciation of visit.   Will continue to follow and make appropriate [facility] referral once disposition facility is confirmed.   Update sent to [facility] NP. Notification sent to inpatient RNCM to make aware [facility] will follow post discharge.    Marthenia Rolling, MSN-Ed, RN,BSN [facility] Liaison [phone]

## 2018-06-06 NOTE — Clinical Social Work Note (Signed)
Clinical Social Work Assessment  Patient Details  Name: Vincent Mcclure MRN: [id number] Date of Birth: 12-25-1931  Date of referral:  06/06/18               Reason for consult:  Facility Placement                Permission sought to share information with:  Family Supports Permission granted to share information::  Yes, Verbal Permission Granted  Name::     wife Writer::     Relationship::     Contact Information:     Housing/Transportation Living arrangements for the past 2 months:  Single Family Home Source of Information:  Patient, Spouse, Medical Team Patient Interpreter Needed:  None Criminal Activity/Legal Involvement Pertinent to Current Situation/Hospitalization:  No - Comment as needed Significant Relationships:  Spouse, Friend Lives with:  Spouse Do you feel safe going back to the place where you live?  Yes Need for family participation in patient care:  Yes (Comment)(wife involved)  Care giving concerns:  Pt admitted from home where he resides with his wife. Recently DC from [facility], states, "I came back to this hospital a few days later when I I was short of breath." Had partial right colectomy 05/19/18, [place] home 8/20. Currently admitted for management of this and CHF.   Social Worker assessment / plan:  CSW consulted to assist with SNF placement. Met with pt and husband at bedside- discussed SNF recommendation and placement process. Pt agreeable to pursue this and wants [facility]. Pt referral made, obtained PASRR. [facility] accepted and CSW initiated [facility] insurance authorization 06/05/18.  Plan: SNF at [place]- awaiting approved insurance authorization  Employment status:  Retired Forensic scientist:  Managed Medicare([employer]) PT Recommendations:  [place], [place] / Referral to community resources:  [place]  Patient/Family's Response to care:   appreciative  Patient/Family's Understanding of and Emotional Response to Diagnosis, Current Treatment, and Prognosis:  Pt seems to understand disposition planning conversations as evidenced by asking pertinent questions about nature of rehab at [facility] and differences in levels of care (OP PT/SNF/HHPT). Does seem confused at times, not recalling earlier parts of conversation.  Wife shows good understanding and is positive re: plan.  Emotional Assessment Appearance:  Appears younger than stated age Attitude/Demeanor/Rapport:  Engaged Affect (typically observed):  Accepting, Pleasant Orientation:  Oriented to Self, Oriented to Place, Oriented to Situation Alcohol / Substance use:  Not Applicable Psych involvement (Current and /or in the community):  No (Comment)  Discharge Needs  Concerns to be addressed:  Discharge Planning Concerns Readmission within the last 30 days:  Yes Current discharge risk:  Dependent with Mobility Barriers to Discharge:  Continued Medical Work up, [employer], LCSW 06/06/2018, 1:52 PM  [phone]

## 2018-06-06 NOTE — Progress Notes (Signed)
PROGRESS NOTE [facility]   [name]   WNU:[id number] DOB: 04-Oct-1932  DOA: 06/02/2018 PCP: [name], DO   Brief Narrative:  Vincent Mcclure 84 year old male with medical history significant for CAD, colon adenocarcinoma, history of alcohol abuse, A. fib, hypertension, status post ex lap and right colectomy with primary anastomosis on 8/9 due to adenocarcinoma.  Patient presented to the surgical clinic postop eval where he was found to be weak, short of breath and congested, subsequently sent to the emergency department where he was evaluated by EDP and surgical team and was admitted with working diagnosis of suspected CHF and pneumoperitoneum.    Subjective: Patient seen and examined, he is dyspneic with speech, however denies shortness of breath.  He feels congested and have gargling cough frequent.  Denies chest pain.  Leg swelling has increased since yesterday.  Assessment & Plan: Acute respiratory failure with hypoxia due to CHF exacerbation Wean oxygen as able, treat underlying causes  Acute on chronic diastolic CHF Echo shows EF of 55%, with mild pulmonary hypertension and grade 1 diastolic dysfunction.  Patient seems to continues to be fluid overload.  Lung exam with bibasilar crackles, lower extremity edema 2-3+ pitting.  Dry weight of about 95 kg, patient was on oral Lasix will change to IV Lasix 40 mg twice daily, monitor I&O's, daily weights and low-salt diet/fluid restriction.  Monitor renal function closely while on IV Lasix. Apply TED Hose and keeps legs elevated.   Chronic A. fib Rate controlled, chads Vasc score 5.  Patient was not on anticoagulation prior to admission due to GI bleed however this felt that is related to adenocarcinoma which has been resected.  Discussed case with surgery okay to start anticoagulation.  Will start Eliquis per pharmacy given elevated stroke risk.  Continue Toprol for rate control.  Pneumoperitoneum Felt to be related to  recent colectomy on 05/19/2018 for adenoCA Per general surgery.  Alcohol abuse No signs of withdrawal, continue to monitor.  Hypokalemia Started on K. Dur 40 mEq daily due to persistent hypokalemia and patient on Lasix.  Keep K above 4 and magnesium above 2.  Check BMP and mag in a.m.  DVT prophylaxis: Eliquis Code Status: Full code Family Communication: None at bedside Disposition Plan: Still with fluid overload need IV Lasix for few more days, TRH will take over case   Consultants:   General surgery  Procedures:     Antimicrobials:  None   Objective: Vitals:   06/05/18 1239 06/05/18 2036 06/06/18 0521 06/06/18 1326  BP: (!) 115/58 122/73 110/69 112/68  Pulse: 93 97 84 99  Resp: (!) 24 20 20 20   Temp: 98.3 F (36.8 C) 99.1 F (37.3 C) 99.2 F (37.3 C)   TempSrc: Oral Oral Oral   SpO2: 98% 94% 93% 99%  Weight:   102.2 kg   Height:        Intake/Output Summary (Last 24 hours) at 06/06/2018 1501 Last data filed at 06/05/2018 2036 Gross per 24 hour  Intake -  Output 75 ml  Net -75 ml   Filed Weights   06/04/18 0523 06/05/18 0700 06/06/18 0521  Weight: 100.5 kg 100.3 kg 102.2 kg    Examination:  General exam: NAD   HEENT: OP moist and clear Respiratory system: Breath sounds diminished b/l bibasilar crackles, loud gargling in upper airway  Cardiovascular system: S1 & S2 heard, RRR. No JVD, murmurs, rubs or gallops Gastrointestinal system: Abdomen is nondistended, soft and nontender.  Central nervous system: Alert  and oriented. No focal neurological deficits. Extremities: B/L LE 2-3+ pitting edema, Skin: No rashes, lesions or ulcers Psychiatry: Mood & affect appropriate.    Data Reviewed: I have personally reviewed following labs and imaging studies  CBC: Recent Labs  Lab 06/02/18 1700 06/02/18 2355 06/05/18 0613 06/06/18 0500  WBC 9.7 9.3 7.1 10.3  NEUTROABS 8.2*  --   --   --   HGB 11.9* 11.0* 10.3* 10.3*  HCT 36.9* 35.0* 32.2* 33.0*  MCV 89.1  89.1 90.2 89.9  PLT 425* 383 391 607*   Basic Metabolic Panel: Recent Labs  Lab 06/02/18 1801  06/03/18 0615 06/03/18 1458 06/04/18 0436 06/05/18 0613 06/06/18 0500  NA  --   --  141 141 143 141 140  K  --   --  2.7* 2.9* 3.3* 3.5 3.6  CL  --   --  97* 98 98 97* 100  CO2  --   --  33* 31 33* 35* 33*  GLUCOSE  --   --  123* 152* 148* 142* 149*  BUN  --   --  12 11 10 11 13   CREATININE  --    < > 0.96 0.95 0.98 0.89 0.94  CALCIUM  --   --  8.0* 8.3* 8.3* 8.3* 8.4*  MG 1.7  --   --  1.8  --  2.4 2.1  PHOS 3.3  --   --   --   --   --   --    < > = values in this interval not displayed.   GFR: Estimated Creatinine Clearance: 71.2 mL/min (by C-G formula based on SCr of 0.94 mg/dL). Liver Function Tests: Recent Labs  Lab 06/02/18 1700  AST 47*  ALT 34  ALKPHOS 121  BILITOT 0.5  PROT 5.7*  ALBUMIN 2.3*   Recent Labs  Lab 06/02/18 1801  LIPASE 103*   No results for input(s): AMMONIA in the last 168 hours. Coagulation Profile: Recent Labs  Lab 06/02/18 1801  INR 1.15   Cardiac Enzymes: Recent Labs  Lab 06/02/18 1801 06/02/18 2355 06/03/18 0615 06/03/18 1158  CKTOTAL 23*  --   --   --   TROPONINI  --  0.03* 0.03* <0.03   BNP (last 3 results) No results for input(s): PROBNP in the last 8760 hours. HbA1C: No results for input(s): HGBA1C in the last 72 hours. CBG: No results for input(s): GLUCAP in the last 168 hours. Lipid Profile: No results for input(s): CHOL, HDL, LDLCALC, TRIG, CHOLHDL, LDLDIRECT in the last 72 hours. Thyroid Function Tests: No results for input(s): TSH, T4TOTAL, FREET4, T3FREE, THYROIDAB in the last 72 hours. Anemia Panel: No results for input(s): VITAMINB12, FOLATE, FERRITIN, TIBC, IRON, RETICCTPCT in the last 72 hours. Sepsis Labs: Recent Labs  Lab 06/02/18 1816  LATICACIDVEN 1.50    Recent Results (from the past 240 hour(s))  Culture, blood (routine x 2)     Status: None (Preliminary result)   Collection Time: 06/02/18  6:01  PM  Result Value Ref Range Status   Specimen Description   Final    BLOOD RIGHT ANTECUBITAL Performed at [facility] [address], [place], [place] [address]    Special Requests   Final    BOTTLES DRAWN AEROBIC AND ANAEROBIC Blood Culture adequate volume Performed at [facility] [address] [place], [place] [address]    Culture   Final    NO GROWTH 4 DAYS Performed at [facility],  [address] [place], [place] [address]    Report Status PENDING  Incomplete  Culture, blood (routine x 2)     Status: None (Preliminary result)   Collection Time: 06/02/18  6:06 PM  Result Value Ref Range Status   Specimen Description   Final    BLOOD LEFT ANTECUBITAL Performed at [facility] [address] [place], [place] [address]    Special Requests   Final    BOTTLES DRAWN AEROBIC AND ANAEROBIC Blood Culture adequate volume Performed at [facility] [address], [place], [place] [address]    Culture   Final    NO GROWTH 4 DAYS Performed at [facility], [place] [address] [place], [place] [address]    Report Status PENDING  Incomplete  Urine culture     Status: None   Collection Time: 06/03/18 10:05 AM  Result Value Ref Range Status   Specimen Description   Final    URINE, CLEAN CATCH Performed at [facility], [place] [address], [place], [place] [address]    Special Requests   Final    NONE Performed at [facility], [place] [address], [place], [place] [address]    Culture   Final    NO GROWTH Performed at [facility], [place] [address], [place], [place] [address]    Report Status 06/04/2018 FINAL  Final      Radiology Studies: Dg Chest Port 1 View  Result Date: 06/06/2018 CLINICAL DATA:  Shortness of breath, hypertension, CABG in May 2018, colonic malignancy, former smoker. EXAM: PORTABLE CHEST 1 VIEW COMPARISON:  PA and lateral chest x-ray  of June 02, 2018 FINDINGS: The free subdiaphragmatic bowel gas seen previously has resolved. The lungs are borderline hypoinflated. The lung markings are coarse at both bases with possible small bilateral pleural effusions. There is no pleural effusion. The heart and pulmonary vascularity are normal. There are post CABG changes. There is calcification in the wall of the aortic arch. The bony thorax exhibits no acute abnormality. IMPRESSION: Borderline hypoinflation. Probable bibasilar atelectasis and small effusions greatest on the right. No CHF. When the patient can tolerate the procedure, a PA and lateral chest x-ray with deep inspiratory effort would be useful. Thoracic aortic atherosclerosis. Electronically Signed   By: David  Martinique M.D.   On: 06/06/2018 10:05      Scheduled Meds: . aspirin EC  81 mg Oral Daily  . enalapril  2.5 mg Oral BID  . enoxaparin (LOVENOX) injection  40 mg Subcutaneous QHS  . ferrous sulfate  325 mg Oral TID WC  . furosemide  40 mg Intravenous BID  . gabapentin  100 mg Oral BID  . metoprolol succinate  50 mg Oral Daily  . multivitamin with minerals  1 tablet Oral Daily  . pantoprazole  40 mg Oral Daily  . polyethylene glycol  17 g Oral Daily  . pravastatin  40 mg Oral QHS   Continuous Infusions: . sodium chloride 250 mL (06/03/18 0939)     LOS: 1 day    Time spent: Total of 35 minutes spent with pt, greater than 50% of which was spent in discussion of  treatment, counseling and coordination of care   Chipper Oman, MD Pager: Text Page via [email or web address]   If 7PM-7AM, please contact night-coverage [email or web address] 06/06/2018, 3:01 PM   Note - This record has been created using [employer]. Chart creation errors have been sought, but may not always  have been located. Such creation errors do not reflect on the standard of medical care.

## 2018-06-06 NOTE — Progress Notes (Signed)
[facility] for Apixaban Indication: atrial fibrillation  Allergies  Allergen Reactions  . No Known Allergies     Patient Measurements: Height: 5\' 11"  (180.3 cm) Weight: 225 lb 5 oz (102.2 kg) IBW/kg (Calculated) : 75.3  Vital Signs: Temp: 99.2 F (37.3 C) (08/27 0521) Temp Source: Oral (08/27 0521) BP: 112/68 (08/27 1326) Pulse Rate: 99 (08/27 1326)  Labs: Recent Labs    06/04/18 0436 06/05/18 0613 06/06/18 0500  HGB  --  10.3* 10.3*  HCT  --  32.2* 33.0*  PLT  --  391 435*  CREATININE 0.98 0.89 0.94    Estimated Creatinine Clearance: 71.2 mL/min (by C-G formula based on SCr of 0.94 mg/dL).   Medical History: Past Medical History:  Diagnosis Date  . Alcohol abuse   . Anemia   . Chronic low back pain 10/14/2011  . Colon polyps    hyperplastic (2004, 2010) and adenomatous (1990).    . COLONIC POLYPS, HX OF 03/05/2008  . Esophageal stricture    hx of  . Gastric ulcer   . GERD 03/05/2008  . GERD (gastroesophageal reflux disease) 1994   associated peptic strictures  . HYPERLIPIDEMIA 03/05/2008  . HYPERTENSION 03/05/2008  . Hypertension   . IBS 03/05/2008  . Impaired glucose tolerance 10/12/2011  . Iron deficiency anemia   . LEG CRAMPS 09/02/2010  . PAD (peripheral artery disease) ([place]) 10/14/2011  . PAD (peripheral artery disease) ([place]) 2013  . PERIPHERAL EDEMA 09/02/2010  . PERIPHERAL NEUROPATHY 03/05/2008  . Prostatitis    hx of  . VARICOSE VEINS, LOWER EXTREMITIES 06/09/2009    Medications:  Scheduled:  . aspirin EC  81 mg Oral Daily  . dextromethorphan-guaiFENesin  1 tablet Oral BID  . enalapril  2.5 mg Oral BID  . enoxaparin (LOVENOX) injection  40 mg Subcutaneous QHS  . ferrous sulfate  325 mg Oral TID WC  . furosemide  40 mg Intravenous BID  . gabapentin  100 mg Oral BID  . metoprolol succinate  50 mg Oral Daily  . multivitamin with minerals  1 tablet Oral Daily  . pantoprazole  40 mg Oral Daily  . polyethylene  glycol  17 g Oral Daily  . potassium chloride  40 mEq Oral Daily  . pravastatin  40 mg Oral QHS    Assessment: 7 yoM to ED 8/23 from surgical clinic c/o ShOB, Suspected CHF exacerbation PMH: CAD, hx ETOH abuse, HTN, Afib - not on anti-coag PTA with hx GI bleed- risk resolved with colon resection 8/9.  Hgb 10.3, Plt wnl  Goal of Therapy:  Monitor platelets by anticoagulation protocol: Yes   Plan:   Apixaban 5mg  bid (pt > 52yr, but SCr < 1.5 and TBW > 60kg - no dose reduction needed)  Discontinue Lovenox  Monitor s/s bleeding  Follow CBC  Minda Ditto PharmD Pager [phone] 06/06/2018, 4:09 PM

## 2018-06-06 NOTE — Progress Notes (Signed)
    CC:SOB, confused and unable to get out of bed or chair.  Subjective: He is happy but still sounds fluid overloaded with upper respiratory rhonchi, and some rales.  Objective: Vital signs in last 24 hours: Temp:  [98.3 F (36.8 C)-99.2 F (37.3 C)] 99.2 F (37.3 C) (08/27 0521) Pulse Rate:  [84-97] 84 (08/27 0521) Resp:  [20-24] 20 (08/27 0521) BP: (110-122)/(58-73) 110/69 (08/27 0521) SpO2:  [93 %-98 %] 93 % (08/27 0521) Weight:  [102.2 kg] 102.2 kg (08/27 0521) Last BM Date: 06/06/18(per pt) No IV recorded NO PO recorded Urine 450 recorded Stool x 4 recorded Afebrile, VSS Labs OK, K+ up to 3.6 Creatinine is normal, glucose is up some H.H is stable  Intake/Output from previous day: 08/26 0701 - 08/27 0700 In: 0  Out: 450 [Urine:450] Intake/Output this shift: No intake/output data recorded.  General appearance: alert, cooperative and no distress Resp: rales and ronchi GI: tolerating the diet well, Multiple BM's, Wound OK  Lab Results:  Recent Labs    06/05/18 0613 06/06/18 0500  WBC 7.1 10.3  HGB 10.3* 10.3*  HCT 32.2* 33.0*  PLT 391 435*    BMET Recent Labs    06/05/18 0613 06/06/18 0500  NA 141 140  K 3.5 3.6  CL 97* 100  CO2 35* 33*  GLUCOSE 142* 149*  BUN 11 13  CREATININE 0.89 0.94  CALCIUM 8.3* 8.4*   PT/INR No results for input(s): LABPROT, INR in the last 72 hours.  Recent Labs  Lab 06/02/18 1700  AST 47*  ALT 34  ALKPHOS 121  BILITOT 0.5  PROT 5.7*  ALBUMIN 2.3*     Lipase     Component Value Date/Time   LIPASE 103 (H) 06/02/2018 1801     Medications: . aspirin EC  81 mg Oral Daily  . enalapril  2.5 mg Oral BID  . enoxaparin (LOVENOX) injection  40 mg Subcutaneous QHS  . ferrous sulfate  325 mg Oral TID WC  . furosemide  40 mg Oral Daily  . gabapentin  100 mg Oral BID  . metoprolol succinate  50 mg Oral Daily  . multivitamin with minerals  1 tablet Oral Daily  . pantoprazole  40 mg Oral Daily  . polyethylene  glycol  17 g Oral Daily  . pravastatin  40 mg Oral QHS    Assessment/Plan  Obstructing colon mass/adenocarcinoma 1/8 positive nodes Oncology following Postop ileus - discharge 05/30/2018 S/P exploratory laparotomy, right hemicolectomy with primary anastomosis, 05/19/2018, Dr. Fanny Skates  Readmit 03/07/7823 Acute diastolic congestive heart failure exacerbation -postop Atrial fibrillation with history of RVR -no anticoagulation secondary to prior bleeding History of alcohol use Failure to thrive/deconditioning Hx of hypertension Hx of hyperlipidemia   FEN:  IV@KVO /Heart healthy diet ID:  None Follow up:  Dr. Waynetta Pean, DO - PCP DVT:  Lovenox  Plan:  Ongoing medical management, Social services is working on SNF placement.  Will defer anticoagulation to Medicine.         LOS: 1 day    Vincent Mcclure 06/06/2018 [phone]

## 2018-06-06 NOTE — Progress Notes (Signed)
Physical Therapy Treatment Patient Details Name: Vincent Mcclure MRN: [id number] DOB: Apr 12, 1932 Today's Date: 06/06/2018    History of Present Illness 84 y.o. male with medical history significant of CAD s/p CABG in 2018, PAF not on anticoagulation due to h/o GIB in past, and s/p exp lap with colectomy 05/19/18.  He was admitted from his follow up surgical appt due to weakness and congestion.     PT Comments    Pt progressing slowly.  amb a limited distance due to increased HR with activity and 3/4 dyspnea.  Pt present with generalized weakness, gait instability and deconditioning.  Also HIGH FALL RISK and high risk for re admit.  Pt will need ST Rehab at SNF prior to returning home.    Follow Up Recommendations  SNF     Equipment Recommendations  None recommended by PT    Recommendations for Other Services       Precautions / Restrictions Precautions Precautions: Fall Precaution Comments: watch HR and sats Restrictions Weight Bearing Restrictions: No    Mobility  Bed Mobility Overal bed mobility: Needs Assistance Bed Mobility: Supine to Sit     Supine to sit: Min assist     General bed mobility comments: increased assist back to bed to support B LE's due to recent ABD surgery   Transfers Overall transfer level: Needs assistance Equipment used: Rolling walker (2 wheeled) Transfers: Sit to/from Stand Sit to Stand: Min assist;Mod assist Stand pivot transfers: Min assist;Mod assist       General transfer comment: assist to power up, cues for sequencing; steadying assist provided for transfer to recliner   Ambulation/Gait Ambulation/Gait assistance: Min assist Gait Distance (Feet): 32 Feet Assistive device: Rolling walker (2 wheeled) Gait Pattern/deviations: Step-through pattern Gait velocity: decreased    General Gait Details: decreased amb distance due to increased HR from 109 to 144 irregular also 3/4 dyspnea    Stairs             Wheelchair  Mobility    Modified Rankin (Stroke Patients Only)       Balance                                            Cognition Arousal/Alertness: Awake/alert Behavior During Therapy: WFL for tasks assessed/performed Overall Cognitive Status: Within Functional Limits for tasks assessed                                 General Comments: pleasant      Exercises      General Comments        Pertinent Vitals/Pain Pain Assessment: No/denies pain    Home Living                      Prior Function            PT Goals (current goals can now be found in the care plan section) Progress towards PT goals: Progressing toward goals    Frequency    Min 3X/week      PT Plan Current plan remains appropriate    Co-evaluation              AM-PAC PT "6 Clicks" Daily Activity  Outcome Measure  Difficulty turning over in bed (including adjusting bedclothes, sheets and blankets)?: A Lot Difficulty  moving from lying on back to sitting on the side of the bed? : A Lot Difficulty sitting down on and standing up from a chair with arms (e.g., wheelchair, bedside commode, etc,.)?: A Lot Help needed moving to and from a bed to chair (including a wheelchair)?: A Lot Help needed walking in hospital room?: A Lot Help needed climbing 3-5 steps with a railing? : Total 6 Click Score: 11    End of Session Equipment Utilized During Treatment: Gait belt Activity Tolerance: Patient tolerated treatment well Patient left: in chair;with call bell/phone within reach Nurse Communication: Mobility status PT Visit Diagnosis: Unsteadiness on feet (R26.81);Muscle weakness (generalized) (M62.81);History of falling (Z91.81)     Time: 1287-8676 PT Time Calculation (min) (ACUTE ONLY): 14 min  Charges:  $Gait Training: 8-22 mins                     Rica Koyanagi  PTA [facility]  Acute  Rehab Pager      [phone]

## 2018-06-07 DIAGNOSIS — D62 Acute posthemorrhagic anemia: Secondary | ICD-10-CM

## 2018-06-07 DIAGNOSIS — C182 Malignant neoplasm of ascending colon: Secondary | ICD-10-CM

## 2018-06-07 LAB — MAGNESIUM: Magnesium: 1.9 mg/dL (ref 1.7–2.4)

## 2018-06-07 LAB — CULTURE, BLOOD (ROUTINE X 2)
CULTURE: NO GROWTH
Culture: NO GROWTH
SPECIAL REQUESTS: ADEQUATE
Special Requests: ADEQUATE

## 2018-06-07 LAB — BASIC METABOLIC PANEL
Anion gap: 7 (ref 5–15)
BUN: 12 mg/dL (ref 8–23)
CHLORIDE: 100 mmol/L (ref 98–111)
CO2: 36 mmol/L — AB (ref 22–32)
CREATININE: 0.89 mg/dL (ref 0.61–1.24)
Calcium: 8.4 mg/dL — ABNORMAL LOW (ref 8.9–10.3)
GFR calc non Af Amer: >60 mL/min (ref >60)
Glucose, Bld: 117 mg/dL — ABNORMAL HIGH (ref 70–99)
Potassium: 3.1 mmol/L — ABNORMAL LOW (ref 3.5–5.1)
Sodium: 143 mmol/L (ref 135–145)

## 2018-06-07 LAB — CBC
HCT: 32.7 % — ABNORMAL LOW (ref 39.0–52.0)
Hemoglobin: 10.1 g/dL — ABNORMAL LOW (ref 13.0–17.0)
MCH: 27.9 pg (ref 26.0–34.0)
MCHC: 30.9 g/dL (ref 30.0–36.0)
MCV: 90.3 fL (ref 78.0–100.0)
Platelets: 376 10*3/uL (ref 150–400)
RBC: 3.62 MIL/uL — ABNORMAL LOW (ref 4.22–5.81)
RDW: 14.3 % (ref 11.5–15.5)
WBC: 8.3 10*3/uL (ref 4.0–10.5)

## 2018-06-07 MED ORDER — ACETAMINOPHEN 325 MG PO TABS
650.0000 mg | ORAL_TABLET | Freq: Four times a day (QID) | ORAL | Status: DC | PRN
  Administered 2018-06-07 – 2018-06-22 (×12): 650 mg via ORAL
  Filled 2018-06-07 (×13): qty 2

## 2018-06-07 MED ORDER — ENOXAPARIN SODIUM 40 MG/0.4ML ~~LOC~~ SOLN
40.0000 mg | SUBCUTANEOUS | Status: DC
  Administered 2018-06-07 – 2018-06-21 (×15): 40 mg via SUBCUTANEOUS
  Filled 2018-06-07 (×15): qty 0.4

## 2018-06-07 MED ORDER — POTASSIUM CHLORIDE CRYS ER 20 MEQ PO TBCR
40.0000 meq | EXTENDED_RELEASE_TABLET | Freq: Two times a day (BID) | ORAL | Status: DC
  Administered 2018-06-07 – 2018-06-13 (×13): 40 meq via ORAL
  Filled 2018-06-07 (×13): qty 2

## 2018-06-07 NOTE — Progress Notes (Signed)
Patient had a 15 beat run of V-tach around 5:00am. Vital signs were stable and patient without complaints. NP on call was notified via text page. Will continue to monitor patient.

## 2018-06-07 NOTE — Progress Notes (Signed)
Occupational Therapy Treatment Patient Details Name: Vincent Mcclure MRN: [id number] DOB: 1932/03/14 Today's Date: 06/07/2018    History of present illness 84 y.o. male with medical history significant of CAD s/p CABG in 2018, PAF not on anticoagulation due to h/o GIB in past, and s/p exp lap with colectomy 05/19/18.  He was admitted from his follow up surgical appt due to weakness and congestion.       Follow Up Recommendations  SNF;Supervision/Assistance - 24 hour    Equipment Recommendations  3 in 1 bedside commode    Recommendations for Other Services      Precautions / Restrictions Precautions Precautions: Fall Precaution Comments: watch HR and sats Restrictions Weight Bearing Restrictions: No       Mobility Bed Mobility Overal bed mobility: Needs Assistance Bed Mobility: Sit to Supine       Sit to supine: Min assist   General bed mobility comments: increased assist back to bed to support B LE's due to recent ABD surgery   Transfers Overall transfer level: Needs assistance Equipment used: Rolling walker (2 wheeled) Transfers: Sit to/from Stand Sit to Stand: Min guard Stand pivot transfers: Min guard       General transfer comment: assist to power up, cues for sequencing; steadying assist provided for transfer to recliner     Balance Overall balance assessment: Needs assistance Sitting-balance support: No upper extremity supported;Feet supported Sitting balance-Leahy Scale: Good     Standing balance support: Bilateral upper extremity supported;During functional activity Standing balance-Leahy Scale: Fair Standing balance comment: reliant on UEs for dynamic activities                           ADL either performed or assessed with clinical judgement   ADL Overall ADL's : Needs assistance/impaired     Grooming: Set up;Sitting;Wash/dry face                   Toilet Transfer: Stand-pivot;Min guard;Ambulation   Toileting- Clothing  Manipulation and Hygiene: Sit to/from stand;Min guard                         Cognition Arousal/Alertness: Awake/alert Behavior During Therapy: WFL for tasks assessed/performed Overall Cognitive Status: Within Functional Limits for tasks assessed                                 General Comments: pleasant                   Pertinent Vitals/ Pain       Pain Score: 2  Faces Pain Scale: Hurts a little bit Pain Location: ABD incision Pain Descriptors / Indicators: Tightness;Discomfort Pain Intervention(s): Monitored during session     Prior Functioning/Environment              Frequency  Min 2X/week        Progress Toward Goals  OT Goals(current goals can now be found in the care plan section)  Progress towards OT goals: Progressing toward goals     Plan Discharge plan remains appropriate       AM-PAC PT "6 Clicks" Daily Activity     Outcome Measure   Help from another person eating meals?: None Help from another person taking care of personal grooming?: A Little Help from another person toileting, which includes using toliet, bedpan, or urinal?: A Little Help  from another person bathing (including washing, rinsing, drying)?: A Lot Help from another person to put on and taking off regular upper body clothing?: A Little Help from another person to put on and taking off regular lower body clothing?: A Lot 6 Click Score: 17    End of Session Equipment Utilized During Treatment: Gait belt  OT Visit Diagnosis: Muscle weakness (generalized) (M62.81)   Activity Tolerance Patient tolerated treatment well   Patient Left with family/visitor present;in bed;with bed alarm set   Nurse Communication Mobility status        Time: 9390-3009 OT Time Calculation (min): 19 min  Charges: OT General Charges $OT Visit: 1 Visit OT Treatments $Self Care/Home Management : 8-22 mins  [place], [place]   Payton Mccallum  D 06/07/2018, 2:00 PM

## 2018-06-07 NOTE — Progress Notes (Signed)
    CC:  SOB, confused and unable to get out of bed or chair.  Subjective: Wound looks OK taking less Kerlix.  He still sounds terrible and cannot clear his secretions.  Still sounds a little confused to me.    Objective: Vital signs in last 24 hours: Temp:  [97.8 F (36.6 C)-98.7 F (37.1 C)] 97.8 F (36.6 C) (08/28 0423) Pulse Rate:  [90-99] 96 (08/28 0512) Resp:  [12-20] 12 (08/28 0423) BP: (110-129)/(68-75) 124/75 (08/28 0512) SpO2:  [97 %-99 %] 97 % (08/28 0423) Weight:  [96.2 kg] 96.2 kg (08/28 0519) Last BM Date: 06/06/18 240 PO recorded 1695 urine Stool x 1 Afebrile, VSS K+ 3.1 - increased KCL to 40 BID early this AM.   Intake/Output from previous day: 08/27 0701 - 08/28 0700 In: 240 [P.O.:240] Out: 1696 [Urine:1695; Stool:1] Intake/Output this shift: No intake/output data recorded.  General appearance: alert, cooperative and no distress Resp: ongoing ronchi, and rales.  Still can't really clear upper respiratory secretions.   GI: large abdomen, tolerating diet, + BM, Open wound with less drainage, taking less kerlix for dressing.    Lab Results:  Recent Labs    06/06/18 0500 06/07/18 0416  WBC 10.3 8.3  HGB 10.3* 10.1*  HCT 33.0* 32.7*  PLT 435* 376    BMET Recent Labs    06/06/18 0500 06/07/18 0416  NA 140 143  K 3.6 3.1*  CL 100 100  CO2 33* 36*  GLUCOSE 149* 117*  BUN 13 12  CREATININE 0.94 0.89  CALCIUM 8.4* 8.4*   PT/INR No results for input(s): LABPROT, INR in the last 72 hours.  Recent Labs  Lab 06/02/18 1700  AST 47*  ALT 34  ALKPHOS 121  BILITOT 0.5  PROT 5.7*  ALBUMIN 2.3*     Lipase     Component Value Date/Time   LIPASE 103 (H) 06/02/2018 1801     Medications: . apixaban  5 mg Oral BID  . aspirin EC  81 mg Oral Daily  . dextromethorphan-guaiFENesin  1 tablet Oral BID  . enalapril  2.5 mg Oral BID  . ferrous sulfate  325 mg Oral TID WC  . furosemide  40 mg Intravenous BID  . gabapentin  100 mg Oral BID  .  metoprolol succinate  50 mg Oral Daily  . multivitamin with minerals  1 tablet Oral Daily  . pantoprazole  40 mg Oral Daily  . polyethylene glycol  17 g Oral Daily  . potassium chloride  40 mEq Oral BID  . pravastatin  40 mg Oral QHS   . sodium chloride 250 mL (06/03/18 0939)   Anti-infectives (From admission, onward)   None     Assessment/Plan Obstructing colon mass/adenocarcinoma 1/8positive nodes Oncology following Postop ileus-discharge 05/30/2018 S/P exploratory laparotomy, right hemicolectomy with primary anastomosis, 05/19/2018, Dr. Fanny Skates  History of alcohol use Hx of hypertension Hx of hyperlipidemia  Readmit 0/62/3762 Acute diastolic congestive heart failure exacerbation-postop Atrial fibrillation with history of RVR-no anticoagulation secondary to prior bleeding Failure to thrive/deconditioning  FEN: IV@KVO /Heart healthy diet ID: None Follow up: Dr. Waynetta Pean, DO - PCP DVT: Lovenox          LOS: 2 days    Earnstine Regal 06/07/2018 [phone]

## 2018-06-07 NOTE — Progress Notes (Signed)
CSW following for SNF placement- [facility]. Insurance authorization was initiated 06/05/18 with [employer], however today upon following up, CSW was informed that misinformation was given that date and pt's [employer] management has termed, therefore insurance authorization should be re-initiated through [facility]. Notified SNF who states authorization will be requested today.  Sharren Bridge, MSW, LCSW Clinical Social Work 06/07/2018 [phone]

## 2018-06-07 NOTE — Progress Notes (Signed)
PROGRESS NOTE    Vincent Mcclure  FBP:[id number] DOB: 1932/02/14 DOA: 06/02/2018 PCP: [name], DO    Brief Narrative:  84 year old male with medical history significant for CAD, colon adenocarcinoma, history of alcohol abuse, A. fib, hypertension, status post ex lap and right colectomy with primary anastomosis on 8/9 due to adenocarcinoma.  Patient presented to the surgical clinic postop eval where he was found to be weak, short of breath and congested, subsequently sent to the emergency department where he was evaluated by EDP and surgical team and was admitted with working diagnosis of suspected CHF and pneumoperitoneum.  Assessment & Plan:   Active Problems:   HLD (hyperlipidemia)   HTN (hypertension)   GERD   h/o Anemia due to blood loss, acute   Alcohol use/ h/o heavy abuse   s/p NSTEMI (non-ST elevated myocardial infarction); with subsequent urgent CABG  5/18 ([place])   PAF (paroxysmal atrial fibrillation) ([place])   Cancer of ascending colon [id number] s/p right colectomy 05/19/2018   Congestive heart failure (CHF) ([name])   Hypokalemia   Atrial fibrillation with RVR ([name])   Hypoalbuminemia   Subacute confusional state   CHF (congestive heart failure) ([name])  Acute respiratory failure with hypoxia due to CHF exacerbation -Currently remains on 3LNC -Improving with scheduled lasix  Acute on chronic diastolic CHF -2d echo with findings of EF of 55%, with mild pulmonary hypertension and grade 1 diastolic dysfunction.   -Pt remains clinically volume overload. Continue with scheduled lasix per above   Chronic A. fib -Rate controlled, chads Vasc score 5 noted.   -Patient currently not on anticoagulation prior to admission due to GI bleed  -Case discussed with General Surgery who did clear patient for anticoagulation, however in presence of pharmacist, nursing, patient has strongly declined anticoagulation and verbalized understanding of risk of stroke in the future. -Will continue  patient on ASA for secondary stroke prevention instead -Continue Toprol for rate control.  Pneumoperitoneum -Felt to be related to recent colectomy on 05/19/2018 for adenoCA -Continue as per General Surgery.  Alcohol abuse -No evidence of ETOH withdrawals noted.  Hypokalemia -Patient is continued on scheduled K. Dur 40 mEq daily due to persistent hypokalemia and patient on Lasix.   -Repeat bmet in AM  DVT prophylaxis: Lovenox subq Code Status: Full Family Communication: Pt in room, family not at bedside Disposition Plan: SNF, when fully diuresed  Consultants:   General Surgery  Procedures:     Antimicrobials: Anti-infectives (From admission, onward)   None       Subjective: Without complaints today  Objective: Vitals:   06/07/18 0423 06/07/18 0512 06/07/18 0519 06/07/18 1359  BP: 110/68 124/75  105/65  Pulse: 90 96  (!) 101  Resp: 12   18  Temp: 97.8 F (36.6 C)   97.7 F (36.5 C)  TempSrc: Oral   Oral  SpO2: 97%   98%  Weight:   96.2 kg   Height:        Intake/Output Summary (Last 24 hours) at 06/07/2018 1656 Last data filed at 06/07/2018 1400 Gross per 24 hour  Intake 600 ml  Output 2096 ml  Net -1496 ml   Filed Weights   06/05/18 0700 06/06/18 0521 06/07/18 0519  Weight: 100.3 kg 102.2 kg 96.2 kg    Examination:  General exam: Appears calm and comfortable  Respiratory system: Coarse breath sounds. Respiratory effort normal. Cardiovascular system: S1 & S2 heard, RRR Gastrointestinal system: Abdomen is nondistended, soft and nontender. No organomegaly or masses felt.  Normal bowel sounds heard. Central nervous system: Alert and oriented. No focal neurological deficits. Extremities: Symmetric 5 x 5 power. BLE edema Skin: No rashes, lesions Psychiatry: Judgement and insight appear normal. Mood & affect appropriate.   Data Reviewed: I have personally reviewed following labs and imaging studies  CBC: Recent Labs  Lab 06/02/18 1700  06/02/18 2355 06/05/18 0613 06/06/18 0500 06/07/18 0416  WBC 9.7 9.3 7.1 10.3 8.3  NEUTROABS 8.2*  --   --   --   --   HGB 11.9* 11.0* 10.3* 10.3* 10.1*  HCT 36.9* 35.0* 32.2* 33.0* 32.7*  MCV 89.1 89.1 90.2 89.9 90.3  PLT 425* 383 391 435* 948   Basic Metabolic Panel: Recent Labs  Lab 06/02/18 1801  06/03/18 1458 06/04/18 0436 06/05/18 0613 06/06/18 0500 06/07/18 0416  NA  --    < > 141 143 141 140 143  K  --    < > 2.9* 3.3* 3.5 3.6 3.1*  CL  --    < > 98 98 97* 100 100  CO2  --    < > 31 33* 35* 33* 36*  GLUCOSE  --    < > 152* 148* 142* 149* 117*  BUN  --    < > 11 10 11 13 12   CREATININE  --    < > 0.95 0.98 0.89 0.94 0.89  CALCIUM  --    < > 8.3* 8.3* 8.3* 8.4* 8.4*  MG 1.7  --  1.8  --  2.4 2.1 1.9  PHOS 3.3  --   --   --   --   --   --    < > = values in this interval not displayed.   GFR: Estimated Creatinine Clearance: 73.1 mL/min (by C-G formula based on SCr of 0.89 mg/dL). Liver Function Tests: Recent Labs  Lab 06/02/18 1700  AST 47*  ALT 34  ALKPHOS 121  BILITOT 0.5  PROT 5.7*  ALBUMIN 2.3*   Recent Labs  Lab 06/02/18 1801  LIPASE 103*   No results for input(s): AMMONIA in the last 168 hours. Coagulation Profile: Recent Labs  Lab 06/02/18 1801  INR 1.15   Cardiac Enzymes: Recent Labs  Lab 06/02/18 1801 06/02/18 2355 06/03/18 0615 06/03/18 1158  CKTOTAL 23*  --   --   --   TROPONINI  --  0.03* 0.03* <0.03   BNP (last 3 results) No results for input(s): PROBNP in the last 8760 hours. HbA1C: No results for input(s): HGBA1C in the last 72 hours. CBG: No results for input(s): GLUCAP in the last 168 hours. Lipid Profile: No results for input(s): CHOL, HDL, LDLCALC, TRIG, CHOLHDL, LDLDIRECT in the last 72 hours. Thyroid Function Tests: No results for input(s): TSH, T4TOTAL, FREET4, T3FREE, THYROIDAB in the last 72 hours. Anemia Panel: No results for input(s): VITAMINB12, FOLATE, FERRITIN, TIBC, IRON, RETICCTPCT in the last 72  hours. Sepsis Labs: Recent Labs  Lab 06/02/18 1816  LATICACIDVEN 1.50    Recent Results (from the past 240 hour(s))  Culture, blood (routine x 2)     Status: None   Collection Time: 06/02/18  6:01 PM  Result Value Ref Range Status   Specimen Description   Final    BLOOD RIGHT ANTECUBITAL Performed at [facility] [address] [place], [place] [address]    Special Requests   Final    BOTTLES DRAWN AEROBIC AND ANAEROBIC Blood Culture adequate volume Performed at [facility]  [address], [place], [place] [address]    Culture   Final    NO GROWTH 5 DAYS Performed at [facility], [place] [address] [place], [place] [address]    Report Status 06/07/2018 FINAL  Final  Culture, blood (routine x 2)     Status: None   Collection Time: 06/02/18  6:06 PM  Result Value Ref Range Status   Specimen Description   Final    BLOOD LEFT ANTECUBITAL Performed at [facility] [address], [place], [place] [address]    Special Requests   Final    BOTTLES DRAWN AEROBIC AND ANAEROBIC Blood Culture adequate volume Performed at [facility] [address] [place], [place] [address]    Culture   Final    NO GROWTH 5 DAYS Performed at [facility], [place] [address] [place], [place] [address]    Report Status 06/07/2018 FINAL  Final  Urine culture     Status: None   Collection Time: 06/03/18 10:05 AM  Result Value Ref Range Status   Specimen Description   Final    URINE, CLEAN CATCH Performed at [facility], [place] [address], [place], [place] [address]    Special Requests   Final    NONE Performed at [facility], [place] [address], [place], [place] [address]    Culture   Final    NO GROWTH Performed at [facility], [place] [address] [place],  [address]    Report Status 06/04/2018 FINAL  Final     Radiology Studies: Dg Chest Port 1  View  Result Date: 06/06/2018 CLINICAL DATA:  Shortness of breath, hypertension, CABG in May 2018, colonic malignancy, former smoker. EXAM: PORTABLE CHEST 1 VIEW COMPARISON:  PA and lateral chest x-ray of June 02, 2018 FINDINGS: The free subdiaphragmatic bowel gas seen previously has resolved. The lungs are borderline hypoinflated. The lung markings are coarse at both bases with possible small bilateral pleural effusions. There is no pleural effusion. The heart and pulmonary vascularity are normal. There are post CABG changes. There is calcification in the wall of the aortic arch. The bony thorax exhibits no acute abnormality. IMPRESSION: Borderline hypoinflation. Probable bibasilar atelectasis and small effusions greatest on the right. No CHF. When the patient can tolerate the procedure, a PA and lateral chest x-ray with deep inspiratory effort would be useful. Thoracic aortic atherosclerosis. Electronically Signed   By: David  Martinique M.D.   On: 06/06/2018 10:05    Scheduled Meds: . aspirin EC  81 mg Oral Daily  . dextromethorphan-guaiFENesin  1 tablet Oral BID  . enalapril  2.5 mg Oral BID  . enoxaparin (LOVENOX) injection  40 mg Subcutaneous Q24H  . ferrous sulfate  325 mg Oral TID WC  . furosemide  40 mg Intravenous BID  . gabapentin  100 mg Oral BID  . metoprolol succinate  50 mg Oral Daily  . multivitamin with minerals  1 tablet Oral Daily  . pantoprazole  40 mg Oral Daily  . polyethylene glycol  17 g Oral Daily  . potassium chloride  40 mEq Oral BID  . pravastatin  40 mg Oral QHS   Continuous Infusions: . sodium chloride 250 mL (06/03/18 0939)     LOS: 2 days   Marylu Lund, MD [facility] Pager [phone]  If 7PM-7AM, please contact night-coverage [email or web address] Password [facility] 06/07/2018, 4:56 PM

## 2018-06-07 NOTE — Care Management Note (Signed)
Case Management Note  Patient Details  Name: Vincent Mcclure MRN: [id number] Date of Birth: 08-07-1932  Subjective/Objective:                    Action/Plan:   Expected Discharge Date:                  Expected Discharge Plan:  Skilled Nursing Facility  In-House Referral:  Clinical Social Work  Discharge planning Services  CM Consult  Post Acute Care Choice:  Resumption of Svcs/PTA Provider Choice offered to:  Patient  DME Arranged:  N/A DME Agency:  NA  HH Arranged:    [name] Agency:  [place]  Status of Service:  Completed, signed off  If discussed at [place] of Stay Meetings, dates discussed:    Additional CommentsPurcell Mouton, RN 06/07/2018, 3:16 PM

## 2018-06-08 DIAGNOSIS — Z789 Other specified health status: Secondary | ICD-10-CM

## 2018-06-08 LAB — BASIC METABOLIC PANEL
Anion gap: 6 (ref 5–15)
BUN: 14 mg/dL (ref 8–23)
CHLORIDE: 98 mmol/L (ref 98–111)
CO2: 37 mmol/L — AB (ref 22–32)
Calcium: 8.7 mg/dL — ABNORMAL LOW (ref 8.9–10.3)
Creatinine, Ser: 1 mg/dL (ref 0.61–1.24)
GFR calc Af Amer: >60 mL/min (ref >60)
GFR calc non Af Amer: >60 mL/min (ref >60)
Glucose, Bld: 123 mg/dL — ABNORMAL HIGH (ref 70–99)
POTASSIUM: 3.7 mmol/L (ref 3.5–5.1)
SODIUM: 141 mmol/L (ref 135–145)

## 2018-06-08 LAB — CBC
HEMATOCRIT: 33.9 % — AB (ref 39.0–52.0)
HEMOGLOBIN: 10.4 g/dL — AB (ref 13.0–17.0)
MCH: 28 pg (ref 26.0–34.0)
MCHC: 30.7 g/dL (ref 30.0–36.0)
MCV: 91.1 fL (ref 78.0–100.0)
Platelets: 365 10*3/uL (ref 150–400)
RBC: 3.72 MIL/uL — ABNORMAL LOW (ref 4.22–5.81)
RDW: 14.1 % (ref 11.5–15.5)
WBC: 8.4 10*3/uL (ref 4.0–10.5)

## 2018-06-08 LAB — MAGNESIUM: MAGNESIUM: 2 mg/dL (ref 1.7–2.4)

## 2018-06-08 MED ORDER — FUROSEMIDE 10 MG/ML IJ SOLN
20.0000 mg | Freq: Once | INTRAMUSCULAR | Status: AC
  Administered 2018-06-08: 20 mg via INTRAVENOUS
  Filled 2018-06-08: qty 2

## 2018-06-08 MED ORDER — ENSURE ENLIVE PO LIQD
237.0000 mL | ORAL | Status: DC
  Administered 2018-06-08 – 2018-06-18 (×6): 237 mL via ORAL

## 2018-06-08 MED ORDER — FUROSEMIDE 10 MG/ML IJ SOLN
60.0000 mg | Freq: Two times a day (BID) | INTRAMUSCULAR | Status: DC
  Administered 2018-06-08 – 2018-06-09 (×2): 60 mg via INTRAVENOUS
  Filled 2018-06-08 (×2): qty 6

## 2018-06-08 NOTE — Progress Notes (Signed)
Pt received approved [facility] insurance authorization for SNF admission per [place]. Updated pt and his wife.  Will assist with transition there when stable for DC.  Sharren Bridge, MSW, LCSW Clinical Social Work 06/08/2018 [phone]

## 2018-06-08 NOTE — Care Management Important Message (Signed)
Important Message  Patient Details  Name: DUFFY DANTONIO MRN: [id number] Date of Birth: 1931/11/01   Medicare Important Message Given:  Yes    Kerin Salen 06/08/2018, 10:42 AMImportant Message  Patient Details  Name: BEREKET GERNERT MRN: [id number] Date of Birth: April 08, 1932   Medicare Important Message Given:  Yes    Kerin Salen 06/08/2018, 10:41 AM

## 2018-06-08 NOTE — Progress Notes (Signed)
PROGRESS NOTE    Vincent Mcclure  HWE:[id number] DOB: 1932/06/25 DOA: 06/02/2018 PCP: [name], DO    Brief Narrative:  84 year old male with medical history significant for CAD, colon adenocarcinoma, history of alcohol abuse, A. fib, hypertension, status post ex lap and right colectomy with primary anastomosis on 8/9 due to adenocarcinoma.  Patient presented to the surgical clinic postop eval where he was found to be weak, short of breath and congested, subsequently sent to the emergency department where he was evaluated by EDP and surgical team and was admitted with working diagnosis of suspected CHF and pneumoperitoneum.  Assessment & Plan:   Active Problems:   HLD (hyperlipidemia)   HTN (hypertension)   GERD   h/o Anemia due to blood loss, acute   Alcohol use/ h/o heavy abuse   s/p NSTEMI (non-ST elevated myocardial infarction); with subsequent urgent CABG  5/18 ([place])   PAF (paroxysmal atrial fibrillation) ([place])   Cancer of ascending colon [id number] s/p right colectomy 05/19/2018   Congestive heart failure (CHF) ([name])   Hypokalemia   Atrial fibrillation with RVR ([name])   Hypoalbuminemia   Subacute confusional state   CHF (congestive heart failure) ([name])  Acute respiratory failure with hypoxia due to CHF exacerbation -Currently remains on 3LNC -Clinically improved with scheduled lasix. Still edematous. -Will increase lasix to 60mg  IV BID  Acute on chronic diastolic CHF -2d echo with findings of EF of 55%, with mild pulmonary hypertension and grade 1 diastolic dysfunction.   -Pt remains clinically volume overload. Increased lasix dose per above -Will repeat bmet in AM  Chronic A. fib -Rate controlled, chads Vasc score 5 noted.   -Patient currently not on anticoagulation prior to admission due to GI bleed  -Case discussed with General Surgery who did clear patient for anticoagulation, however in presence of pharmacist, nursing, patient has strongly declined  anticoagulation and verbalized understanding of risk of stroke in the future. -Will continue patient on ASA for secondary stroke prevention instead -Will continue with current beta blocker regimen  Pneumoperitoneum -Felt to be related to recent colectomy on 05/19/2018 for adenoCA -Continue as per General Surgery.  Alcohol abuse -No evidence of ETOH withdrawals thus far -recommend continued ETOH avoidance when discharged  Hypokalemia -Patient is continued on scheduled K. Dur 40 mEq daily due to persistent hypokalemia and patient on Lasix.   -Potassium stable -Repeat bmet in AM  DVT prophylaxis: Lovenox subq Code Status: Full Family Communication: Pt in room, family not at bedside Disposition Plan: Plan for SNF, when fully diuresed  Consultants:   General Surgery  Procedures:     Antimicrobials: Anti-infectives (From admission, onward)   None      Subjective: Reports feeling better today  Objective: Vitals:   06/07/18 0519 06/07/18 1359 06/07/18 2013 06/08/18 0446  BP:  105/65 125/71 126/68  Pulse:  (!) 101 97 91  Resp:  18 18 (!) 24  Temp:  97.7 F (36.5 C) 99.4 F (37.4 C) 98.2 F (36.8 C)  TempSrc:  Oral Oral Oral  SpO2:  98% 98% 98%  Weight: 96.2 kg   95.7 kg  Height:        Intake/Output Summary (Last 24 hours) at 06/08/2018 1309 Last data filed at 06/08/2018 1058 Gross per 24 hour  Intake 240 ml  Output 1010 ml  Net -770 ml   Filed Weights   06/06/18 0521 06/07/18 0519 06/08/18 0446  Weight: 102.2 kg 96.2 kg 95.7 kg    Examination: General exam: Awake, laying in  bed, in nad Respiratory system: Normal respiratory effort, no wheezing Cardiovascular system: regular rate, s1, s2 Gastrointestinal system: Soft, nondistended, positive BS Central nervous system: CN2-12 grossly intact, strength intact Extremities: Perfused, no clubbing Skin: Normal skin turgor, no notable skin lesions seen Psychiatry: Mood normal // no visual hallucinations    Data Reviewed: I have personally reviewed following labs and imaging studies  CBC: Recent Labs  Lab 06/02/18 1700 06/02/18 2355 06/05/18 0613 06/06/18 0500 06/07/18 0416 06/08/18 0421  WBC 9.7 9.3 7.1 10.3 8.3 8.4  NEUTROABS 8.2*  --   --   --   --   --   HGB 11.9* 11.0* 10.3* 10.3* 10.1* 10.4*  HCT 36.9* 35.0* 32.2* 33.0* 32.7* 33.9*  MCV 89.1 89.1 90.2 89.9 90.3 91.1  PLT 425* 383 391 435* 376 035   Basic Metabolic Panel: Recent Labs  Lab 06/02/18 1801  06/03/18 1458 06/04/18 0436 06/05/18 0613 06/06/18 0500 06/07/18 0416 06/08/18 0421  NA  --    < > 141 143 141 140 143 141  K  --    < > 2.9* 3.3* 3.5 3.6 3.1* 3.7  CL  --    < > 98 98 97* 100 100 98  CO2  --    < > 31 33* 35* 33* 36* 37*  GLUCOSE  --    < > 152* 148* 142* 149* 117* 123*  BUN  --    < > 11 10 11 13 12 14   CREATININE  --    < > 0.95 0.98 0.89 0.94 0.89 1.00  CALCIUM  --    < > 8.3* 8.3* 8.3* 8.4* 8.4* 8.7*  MG 1.7  --  1.8  --  2.4 2.1 1.9 2.0  PHOS 3.3  --   --   --   --   --   --   --    < > = values in this interval not displayed.   GFR: Estimated Creatinine Clearance: 64.9 mL/min (by C-G formula based on SCr of 1 mg/dL). Liver Function Tests: Recent Labs  Lab 06/02/18 1700  AST 47*  ALT 34  ALKPHOS 121  BILITOT 0.5  PROT 5.7*  ALBUMIN 2.3*   Recent Labs  Lab 06/02/18 1801  LIPASE 103*   No results for input(s): AMMONIA in the last 168 hours. Coagulation Profile: Recent Labs  Lab 06/02/18 1801  INR 1.15   Cardiac Enzymes: Recent Labs  Lab 06/02/18 1801 06/02/18 2355 06/03/18 0615 06/03/18 1158  CKTOTAL 23*  --   --   --   TROPONINI  --  0.03* 0.03* <0.03   BNP (last 3 results) No results for input(s): PROBNP in the last 8760 hours. HbA1C: No results for input(s): HGBA1C in the last 72 hours. CBG: No results for input(s): GLUCAP in the last 168 hours. Lipid Profile: No results for input(s): CHOL, HDL, LDLCALC, TRIG, CHOLHDL, LDLDIRECT in the last 72  hours. Thyroid Function Tests: No results for input(s): TSH, T4TOTAL, FREET4, T3FREE, THYROIDAB in the last 72 hours. Anemia Panel: No results for input(s): VITAMINB12, FOLATE, FERRITIN, TIBC, IRON, RETICCTPCT in the last 72 hours. Sepsis Labs: Recent Labs  Lab 06/02/18 1816  LATICACIDVEN 1.50    Recent Results (from the past 240 hour(s))  Culture, blood (routine x 2)     Status: None   Collection Time: 06/02/18  6:01 PM  Result Value Ref Range Status   Specimen Description   Final    BLOOD RIGHT ANTECUBITAL Performed at [facility]  [place] [address], [place], [place] [address]    Special Requests   Final    BOTTLES DRAWN AEROBIC AND ANAEROBIC Blood Culture adequate volume Performed at [facility] [address] [place], [place] [address]    Culture   Final    NO GROWTH 5 DAYS Performed at [facility], [place] [address], [place], [place] [address]    Report Status 06/07/2018 FINAL  Final  Culture, blood (routine x 2)     Status: None   Collection Time: 06/02/18  6:06 PM  Result Value Ref Range Status   Specimen Description   Final    BLOOD LEFT ANTECUBITAL Performed at [facility] [address], [place], [place] [address]    Special Requests   Final    BOTTLES DRAWN AEROBIC AND ANAEROBIC Blood Culture adequate volume Performed at [facility] [address], [place], [place] [address]    Culture   Final    NO GROWTH 5 DAYS Performed at [facility], [place] [address], [place], [place] [address]    Report Status 06/07/2018 FINAL  Final  Urine culture     Status: None   Collection Time: 06/03/18 10:05 AM  Result Value Ref Range Status   Specimen Description   Final    URINE, CLEAN CATCH Performed at [facility], [place] [address] [place], [place] [address]    Special Requests   Final    NONE Performed at [facility], [place] [address],  [place], [place] [address]    Culture   Final    NO GROWTH Performed at [facility], [place] [address] [place], [place] [address]    Report Status 06/04/2018 FINAL  Final     Radiology Studies: No results found.  Scheduled Meds: . aspirin EC  81 mg Oral Daily  . dextromethorphan-guaiFENesin  1 tablet Oral BID  . enalapril  2.5 mg Oral BID  . enoxaparin (LOVENOX) injection  40 mg Subcutaneous Q24H  . furosemide  60 mg Intravenous BID  . gabapentin  100 mg Oral BID  . metoprolol succinate  50 mg Oral Daily  . multivitamin with minerals  1 tablet Oral Daily  . pantoprazole  40 mg Oral Daily  . polyethylene glycol  17 g Oral Daily  . potassium chloride  40 mEq Oral BID  . pravastatin  40 mg Oral QHS   Continuous Infusions: . sodium chloride 250 mL (06/03/18 0939)     LOS: 3 days   Marylu Lund, MD [facility] Pager [phone]  If 7PM-7AM, please contact night-coverage [email or web address] Password [email or web address] 06/08/2018, 1:09 PM

## 2018-06-08 NOTE — Progress Notes (Signed)
    CC:  SOB, confused and unable to get out of bed or chair.   Subjective: No real change from our standpoint, continue dressing changes. He sounds a little better this AM.  Still congested and thick upper airway breath sounds.  Objective: Vital signs in last 24 hours: Temp:  [97.7 F (36.5 C)-99.4 F (37.4 C)] 98.2 F (36.8 C) (08/29 0446) Pulse Rate:  [91-101] 91 (08/29 0446) Resp:  [18-24] 24 (08/29 0446) BP: (105-126)/(65-71) 126/68 (08/29 0446) SpO2:  [98 %] 98 % (08/29 0446) Weight:  [95.7 kg] 95.7 kg (08/29 0446) Last BM Date: 06/06/18 360 Po Urine 1150 Stool x 1 Afebrile, VSS some ongoing tachycardia Labs OK K+ up to 3.7 creatinine 1.0 Intake/Output from previous day: 08/28 0701 - 08/29 0700 In: 360 [P.O.:360] Out: 1150 [Urine:1150] Intake/Output this shift: No intake/output data recorded.  General appearance: alert, cooperative, no distress and still ? confused. Resp: rales better, still has thick upper airway ronchi, may be clearing better.   GI: abnormal findings:  abdomen OK, continue dressing changes.tolerating diet and having BM's  Lab Results:  Recent Labs    06/07/18 0416 06/08/18 0421  WBC 8.3 8.4  HGB 10.1* 10.4*  HCT 32.7* 33.9*  PLT 376 365    BMET Recent Labs    06/07/18 0416 06/08/18 0421  NA 143 141  K 3.1* 3.7  CL 100 98  CO2 36* 37*  GLUCOSE 117* 123*  BUN 12 14  CREATININE 0.89 1.00  CALCIUM 8.4* 8.7*   PT/INR No results for input(s): LABPROT, INR in the last 72 hours.  Recent Labs  Lab 06/02/18 1700  AST 47*  ALT 34  ALKPHOS 121  BILITOT 0.5  PROT 5.7*  ALBUMIN 2.3*     Lipase     Component Value Date/Time   LIPASE 103 (H) 06/02/2018 1801     Medications: . aspirin EC  81 mg Oral Daily  . dextromethorphan-guaiFENesin  1 tablet Oral BID  . enalapril  2.5 mg Oral BID  . enoxaparin (LOVENOX) injection  40 mg Subcutaneous Q24H  . ferrous sulfate  325 mg Oral TID WC  . furosemide  40 mg Intravenous BID   . gabapentin  100 mg Oral BID  . metoprolol succinate  50 mg Oral Daily  . multivitamin with minerals  1 tablet Oral Daily  . pantoprazole  40 mg Oral Daily  . polyethylene glycol  17 g Oral Daily  . potassium chloride  40 mEq Oral BID  . pravastatin  40 mg Oral QHS    Assessment/Plan Obstructing colon mass/adenocarcinoma 1/8positive nodes Oncology following Postop ileus-discharge 05/30/2018 S/P exploratory laparotomy, right hemicolectomy with primary anastomosis, 05/19/2018, Dr. Fanny Skates  History of alcohol use Hx of hypertension Hx of hyperlipidemia  Readmit 3/81/8299 Acute diastolic congestive heart failure exacerbation-postop Atrial fibrillation with history of RVR-no anticoagulation secondary to prior bleeding Failure to thrive/deconditioning  FEN: IV@KVO /Heart healthy diet ID: None Follow up: Dr. Waynetta Pean, DO - PCP DVT: Lovenox  Plan:  Per Medicine, he can follow up with [name] after discharge.  Continue BID dressing changes till open abdomen has healed in.        LOS: 3 days    Vincent Mcclure 06/08/2018 [phone]

## 2018-06-08 NOTE — Progress Notes (Addendum)
Physical Therapy Treatment Patient Details Name: Vincent Mcclure MRN: [id number] DOB: 1932/08/23 Today's Date: 06/08/2018    History of Present Illness 84 y.o. male with medical history significant of CAD s/p CABG in 2018, PAF not on anticoagulation due to h/o GIB in past, and s/p exp lap with colectomy 05/19/18.  He was admitted from his follow up surgical appt due to weakness and congestion.     PT Comments    Progressing slowly with mobility. Pt continues to fatigue easily with activity. O2 sat dropped to 88% on RA after walk to/from bathroom. Replaced [place] O2-3L for walk in hallway--O2 93%, dyspnea 3/4, HR as high as 130s and irregular. Pt remains at risk for falls due to weakness, decreased activity tolerance, and impaired gait/balance. Continue to recommend ST rehab at [facility].    Follow Up Recommendations  SNF     Equipment Recommendations  None recommended by PT    Recommendations for Other Services       Precautions / Restrictions Precautions Precautions: Fall Precaution Comments: watch HR and sats Restrictions Weight Bearing Restrictions: No    Mobility  Bed Mobility Overal bed mobility: Needs Assistance Bed Mobility: Supine to Sit;Sit to Supine     Supine to sit: Min assist;HOB elevated Sit to supine: Min guard   General bed mobility comments: Assist for trunk. Increased time.   Transfers Overall transfer level: Needs assistance Equipment used: None;Rolling walker (2 wheeled) Transfers: Sit to/from Stand Sit to Stand: Min assist Stand pivot transfers: From elevated surface       General transfer comment: assist to power up and to control descent. VCS safety, hand placement. Increased time.   Ambulation/Gait Ambulation/Gait assistance: Min assist Gait Distance (Feet): 75 Feet Assistive device: Rolling walker (2 wheeled) Gait Pattern/deviations: Step-through pattern;Decreased stride length     General Gait Details: Assist to stabilize pt throughout distance.  Standing rest break taken/needed mid distance. O2 93% 3L [place], HR as high as 130s and irregular. Dyspnea 3/4.    Stairs             Wheelchair Mobility    Modified Rankin (Stroke Patients Only)       Balance Overall balance assessment: Needs assistance           Standing balance-Leahy Scale: Fair                              Cognition Arousal/Alertness: Awake/alert Behavior During Therapy: WFL for tasks assessed/performed Overall Cognitive Status: Within Functional Limits for tasks assessed                                        Exercises      General Comments        Pertinent Vitals/Pain Pain Assessment: Faces Faces Pain Scale: Hurts even more Pain Location: chest, Abd incision, R shoulder Pain Descriptors / Indicators: Tightness;Discomfort Pain Intervention(s): Monitored during session;Repositioned    Home Living                      Prior Function            PT Goals (current goals can now be found in the care plan section) Progress towards PT goals: Progressing toward goals    Frequency    Min 3X/week      PT Plan Current plan  remains appropriate    Co-evaluation              AM-PAC PT "6 Clicks" Daily Activity  Outcome Measure  Difficulty turning over in bed (including adjusting bedclothes, sheets and blankets)?: A Lot Difficulty moving from lying on back to sitting on the side of the bed? : Unable Difficulty sitting down on and standing up from a chair with arms (e.g., wheelchair, bedside commode, etc,.)?: Unable Help needed moving to and from a bed to chair (including a wheelchair)?: A Little Help needed walking in hospital room?: A Little Help needed climbing 3-5 steps with a railing? : A Lot 6 Click Score: 12    End of Session Equipment Utilized During Treatment: Gait belt;Oxygen Activity Tolerance: Patient limited by fatigue Patient left: in bed;with call bell/phone within reach;with  bed alarm set   PT Visit Diagnosis: Unsteadiness on feet (R26.81);Muscle weakness (generalized) (M62.81);History of falling (Z91.81);Difficulty in walking, not elsewhere classified (R26.2)     Time: 0300-9233 PT Time Calculation (min) (ACUTE ONLY): 29 min  Charges:  $Gait Training: 8-22 mins $Therapeutic Activity: 8-22 mins                        Weston Anna, MPT Pager: [phone]

## 2018-06-08 NOTE — Progress Notes (Signed)
Nutrition Follow-up  DOCUMENTATION CODES:   Obesity unspecified  INTERVENTION:    Magic cup TID with meals, each supplement provides 290 kcal and 9 grams of protein  Ensure Enlive po once daily, each supplement provides 350 kcal and 20 grams of protein  NUTRITION DIAGNOSIS:   Increased nutrient needs related to chronic illness, cancer and cancer related treatments as evidenced by estimated needs.  Ongoing  GOAL:   Patient will meet greater than or equal to 90% of their needs  Progressing  MONITOR:   PO intake, Labs, Weight trends, I & O's  REASON FOR ASSESSMENT:   Consult Enteral/tube feeding initiation and management  ASSESSMENT:   84 y.o. male with medical history significant of CAD, Colon Cancer, close intolerance, GI bleed with melena, iron deficiency anemia, history of alcohol abuse, Afib,  HTN,  Admitted for CHF exacerbation   Pt sleeping upon follow up. Spoke with RN who reports pt sleeps consistently throughout the day. He eats most of his meals and enjoys Magic Cups TID. Meal completion charts do not reflect recent meals. RD observed lunch tray with tilapia and vegetables at bedside untouched. RD to add Ensure to maximize calories and protein.   Weight noted to decrease 10 lb since last RD visit on 8/25 (221 lb to 210 lb). Pt continues with IV lasix. Will continue to monitor trends.   Plan is for pt to d/c to SNF once he is fully diuresed.   Medications reviewed and include: 60 mg lasix, MVI with minerals, 40 mEq KCl Labs reviewed.   Diet Order:   Diet Order            Diet heart healthy/carb modified Room service appropriate? Yes; Fluid consistency: Thin; Fluid restriction: 1500 mL Fluid  Diet effective now              EDUCATION NEEDS:   No education needs have been identified at this time  Skin:  Skin Assessment: Reviewed RN Assessment  Last BM:  06/06/18  Height:   Ht Readings from Last 1 Encounters:  06/03/18 5\' 11"  (1.803 m)     Weight:   Wt Readings from Last 1 Encounters:  06/08/18 95.7 kg    Ideal Body Weight:  78.2 kg  BMI:  Body mass index is 29.43 kg/m.  Estimated Nutritional Needs:   Kcal:  2100-2300  Protein:  95-105g  Fluid:  1500 ml fluid restriction   Mariana Single RD, LDN Clinical Nutrition Pager # - [phone]

## 2018-06-09 ENCOUNTER — Inpatient Hospital Stay (HOSPITAL_COMMUNITY): Payer: Medicare HMO

## 2018-06-09 LAB — BASIC METABOLIC PANEL
ANION GAP: 10 (ref 5–15)
BUN: 15 mg/dL (ref 8–23)
CALCIUM: 8.5 mg/dL — AB (ref 8.9–10.3)
CO2: 35 mmol/L — ABNORMAL HIGH (ref 22–32)
Chloride: 95 mmol/L — ABNORMAL LOW (ref 98–111)
Creatinine, Ser: 1.03 mg/dL (ref 0.61–1.24)
GLUCOSE: 155 mg/dL — AB (ref 70–99)
Potassium: 3.9 mmol/L (ref 3.5–5.1)
SODIUM: 140 mmol/L (ref 135–145)

## 2018-06-09 LAB — BLOOD GAS, ARTERIAL
Acid-Base Excess: 11.5 mmol/L — ABNORMAL HIGH (ref 0.0–2.0)
Bicarbonate: 38 mmol/L — ABNORMAL HIGH (ref 20.0–28.0)
DRAWN BY: 257881
O2 CONTENT: 4 L/min
O2 SAT: 95.6 %
PATIENT TEMPERATURE: 98.6
pCO2 arterial: 63.5 mmHg — ABNORMAL HIGH (ref 32.0–48.0)
pH, Arterial: 7.395 (ref 7.350–7.450)
pO2, Arterial: 77.8 mmHg — ABNORMAL LOW (ref 83.0–108.0)

## 2018-06-09 LAB — CBC
HCT: 35.1 % — ABNORMAL LOW (ref 39.0–52.0)
Hemoglobin: 10.8 g/dL — ABNORMAL LOW (ref 13.0–17.0)
MCH: 27.9 pg (ref 26.0–34.0)
MCHC: 30.8 g/dL (ref 30.0–36.0)
MCV: 90.7 fL (ref 78.0–100.0)
PLATELETS: 398 10*3/uL (ref 150–400)
RBC: 3.87 MIL/uL — AB (ref 4.22–5.81)
RDW: 14.4 % (ref 11.5–15.5)
WBC: 11.4 10*3/uL — AB (ref 4.0–10.5)

## 2018-06-09 LAB — PROCALCITONIN: PROCALCITONIN: 0.21 ng/mL

## 2018-06-09 LAB — MAGNESIUM: MAGNESIUM: 1.8 mg/dL (ref 1.7–2.4)

## 2018-06-09 MED ORDER — VANCOMYCIN HCL 10 G IV SOLR
1500.0000 mg | INTRAVENOUS | Status: DC
  Administered 2018-06-10 – 2018-06-11 (×2): 1500 mg via INTRAVENOUS
  Filled 2018-06-09 (×3): qty 1500

## 2018-06-09 MED ORDER — SODIUM CHLORIDE 0.9 % IV SOLN
1.0000 g | Freq: Three times a day (TID) | INTRAVENOUS | Status: DC
  Administered 2018-06-09 – 2018-06-17 (×25): 1 g via INTRAVENOUS
  Filled 2018-06-09 (×25): qty 1

## 2018-06-09 MED ORDER — VANCOMYCIN HCL 10 G IV SOLR
2000.0000 mg | Freq: Once | INTRAVENOUS | Status: AC
  Administered 2018-06-09: 2000 mg via INTRAVENOUS
  Filled 2018-06-09: qty 2000

## 2018-06-09 NOTE — Progress Notes (Addendum)
PROGRESS NOTE    Vincent Mcclure  WNI:[id number] DOB: 1932-06-11 DOA: 06/02/2018 PCP: [name], DO    Brief Narrative:  84 year old male with medical history significant for CAD, colon adenocarcinoma, history of alcohol abuse, A. fib, hypertension, status post ex lap and right colectomy with primary anastomosis on 8/9 due to adenocarcinoma.  Patient presented to the surgical clinic postop eval where he was found to be weak, short of breath and congested, subsequently sent to the emergency department where he was evaluated by EDP and surgical team and was admitted with working diagnosis of suspected CHF and pneumoperitoneum.  Assessment & Plan:   Active Problems:   HLD (hyperlipidemia)   HTN (hypertension)   GERD   h/o Anemia due to blood loss, acute   Alcohol use/ h/o heavy abuse   s/p NSTEMI (non-ST elevated myocardial infarction); with subsequent urgent CABG  5/18 ([place])   PAF (paroxysmal atrial fibrillation) ([place])   Cancer of ascending colon [id number] s/p right colectomy 05/19/2018   Congestive heart failure (CHF) ([name])   Hypokalemia   Atrial fibrillation with RVR ([name])   Hypoalbuminemia   Subacute confusional state   CHF (congestive heart failure) ([name])  Acute respiratory failure with hypoxia due to CHF exacerbation, HCAP with sepsis not present on admission -Currently remains on [facility] -Recently improved with scheduled lasix. Edema has improved -Patient with fevers, increased sob, mildly elevated WBC, increased secretions worrisome for PNA with sepsis. Given sepsis, will hold further lasix -Start vancomycin and cefepime to cover HCAP -Will check blood cultures. Ordered and reviewed CXR, pneumoperitoneum noted. Check UA -Repeat CBC and bmet in AM  Acute on chronic diastolic CHF -2d echo with findings of EF of 55%, with mild pulmonary hypertension and grade 1 diastolic dysfunction.   -Improvement noted following IV lasix -Lasix on hold secondary to above sepsis  Chronic  A. fib -Rate controlled, chads Vasc score 5 noted.   -Patient currently not on anticoagulation prior to admission due to GI bleed  -Case discussed with General Surgery who did clear patient for anticoagulation, however in presence of pharmacist, nursing, patient has strongly declined anticoagulation and verbalized understanding of risk of stroke in the future. -Will continue patient on ASA for secondary stroke prevention instead -Will continue with current beta blocker regimen -Stable this AM  Pneumoperitoneum -Felt to be related to recent colectomy on 05/19/2018 for adenoCA -Again noted on CXR on 8/30 -Continue as per General Surgery.  Alcohol abuse -No evidence of ETOH withdrawals thus far -Will recommend ETOH avoidance when discharged  Hypokalemia -Patient is continued on scheduled K. Dur 40 mEq daily due to persistent hypokalemia and patient on Lasix.   -Potassium levels stable -Will recheck bmet in AM  DVT prophylaxis: Lovenox subq Code Status: Full Family Communication: Pt in room, family not at bedside Disposition Plan: Plan for SNF, when fully diuresed  Consultants:   General Surgery  Procedures:     Antimicrobials: Anti-infectives (From admission, onward)   Start     Dose/Rate Route Frequency Ordered Stop   06/10/18 1200  vancomycin (VANCOCIN) 1,500 mg in sodium chloride 0.9 % 500 mL IVPB     1,500 mg 250 mL/hr over 120 Minutes Intravenous Every 24 hours 06/09/18 1055     06/09/18 1200  ceFEPIme (MAXIPIME) 1 g in sodium chloride 0.9 % 100 mL IVPB     1 g 200 mL/hr over 30 Minutes Intravenous Every 8 hours 06/09/18 1023     06/09/18 1100  vancomycin (VANCOCIN) 2,000 mg in  sodium chloride 0.9 % 500 mL IVPB     2,000 mg 250 mL/hr over 120 Minutes Intravenous  Once 06/09/18 1023 06/09/18 1349      Subjective: More drowsy today, difficult to assess  Objective: Vitals:   06/08/18 1457 06/08/18 2037 06/09/18 0441 06/09/18 1331  BP: 117/66 114/68 110/66  111/64  Pulse: 98 (!) 106 (!) 107 98  Resp: (!) 24 20 18 20   Temp: 98.7 F (37.1 C) (!) 100.7 F (38.2 C) 99.8 F (37.7 C) 99.4 F (37.4 C)  TempSrc: Oral Oral Oral Oral  SpO2: (!) 88% 96% 93% 98%  Weight:   95.7 kg   Height:        Intake/Output Summary (Last 24 hours) at 06/09/2018 1509 Last data filed at 06/09/2018 1059 Gross per 24 hour  Intake 1200 ml  Output 1050 ml  Net 150 ml   Filed Weights   06/07/18 0519 06/08/18 0446 06/09/18 0441  Weight: 96.2 kg 95.7 kg 95.7 kg    Examination: General exam: Conversant, in no acute distress Respiratory system: normal chest rise, clear, no audible wheezing, coarse breath sounds Cardiovascular system: regular rhythm, s1-s2 Gastrointestinal system: Nondistended, nontender, pos BS Central nervous system: No seizures, no tremors Extremities: No cyanosis, no joint deformities Skin: No rashes, no pallor Psychiatry: drowsy, difficult to assess   Data Reviewed: I have personally reviewed following labs and imaging studies  CBC: Recent Labs  Lab 06/02/18 1700  06/05/18 0613 06/06/18 0500 06/07/18 0416 06/08/18 0421 06/09/18 0420  WBC 9.7   < > 7.1 10.3 8.3 8.4 11.4*  NEUTROABS 8.2*  --   --   --   --   --   --   HGB 11.9*   < > 10.3* 10.3* 10.1* 10.4* 10.8*  HCT 36.9*   < > 32.2* 33.0* 32.7* 33.9* 35.1*  MCV 89.1   < > 90.2 89.9 90.3 91.1 90.7  PLT 425*   < > 391 435* 376 365 398   < > = values in this interval not displayed.   Basic Metabolic Panel: Recent Labs  Lab 06/02/18 1801  06/05/18 0613 06/06/18 0500 06/07/18 0416 06/08/18 0421 06/09/18 0420  NA  --    < > 141 140 143 141 140  K  --    < > 3.5 3.6 3.1* 3.7 3.9  CL  --    < > 97* 100 100 98 95*  CO2  --    < > 35* 33* 36* 37* 35*  GLUCOSE  --    < > 142* 149* 117* 123* 155*  BUN  --    < > 11 13 12 14 15   CREATININE  --    < > 0.89 0.94 0.89 1.00 1.03  CALCIUM  --    < > 8.3* 8.4* 8.4* 8.7* 8.5*  MG 1.7   < > 2.4 2.1 1.9 2.0 1.8  PHOS 3.3  --   --   --    --   --   --    < > = values in this interval not displayed.   GFR: Estimated Creatinine Clearance: 63.1 mL/min (by C-G formula based on SCr of 1.03 mg/dL). Liver Function Tests: Recent Labs  Lab 06/02/18 1700  AST 47*  ALT 34  ALKPHOS 121  BILITOT 0.5  PROT 5.7*  ALBUMIN 2.3*   Recent Labs  Lab 06/02/18 1801  LIPASE 103*   No results for input(s): AMMONIA in the last 168 hours. Coagulation Profile: Recent  Labs  Lab 06/02/18 1801  INR 1.15   Cardiac Enzymes: Recent Labs  Lab 06/02/18 1801 06/02/18 2355 06/03/18 0615 06/03/18 1158  CKTOTAL 23*  --   --   --   TROPONINI  --  0.03* 0.03* <0.03   BNP (last 3 results) No results for input(s): PROBNP in the last 8760 hours. HbA1C: No results for input(s): HGBA1C in the last 72 hours. CBG: No results for input(s): GLUCAP in the last 168 hours. Lipid Profile: No results for input(s): CHOL, HDL, LDLCALC, TRIG, CHOLHDL, LDLDIRECT in the last 72 hours. Thyroid Function Tests: No results for input(s): TSH, T4TOTAL, FREET4, T3FREE, THYROIDAB in the last 72 hours. Anemia Panel: No results for input(s): VITAMINB12, FOLATE, FERRITIN, TIBC, IRON, RETICCTPCT in the last 72 hours. Sepsis Labs: Recent Labs  Lab 06/02/18 1816 06/09/18 1101  PROCALCITON  --  0.21  LATICACIDVEN 1.50  --     Recent Results (from the past 240 hour(s))  Culture, blood (routine x 2)     Status: None   Collection Time: 06/02/18  6:01 PM  Result Value Ref Range Status   Specimen Description   Final    BLOOD RIGHT ANTECUBITAL Performed at [name] [address], [place], [place] [address]    Special Requests   Final    BOTTLES DRAWN AEROBIC AND ANAEROBIC Blood Culture adequate volume Performed at [facility] [address], [place], [place] [address]    Culture   Final    NO GROWTH 5 DAYS Performed at [facility], [place] [address] [place], [place] [address]    Report Status 06/07/2018  FINAL  Final  Culture, blood (routine x 2)     Status: None   Collection Time: 06/02/18  6:06 PM  Result Value Ref Range Status   Specimen Description   Final    BLOOD LEFT ANTECUBITAL Performed at [facility] [address], [place], [place] [address]    Special Requests   Final    BOTTLES DRAWN AEROBIC AND ANAEROBIC Blood Culture adequate volume Performed at [facility] [address], [place], [place] [address]    Culture   Final    NO GROWTH 5 DAYS Performed at [facility], [place] [address], [place], [place] [address]    Report Status 06/07/2018 FINAL  Final  Urine culture     Status: None   Collection Time: 06/03/18 10:05 AM  Result Value Ref Range Status   Specimen Description   Final    URINE, CLEAN CATCH Performed at [facility], [place] [address] [place], [place] [address]    Special Requests   Final    NONE Performed at [facility], [place] [address] [place], [place] [address]    Culture   Final    NO GROWTH Performed at [facility], [place] [address], [place], [place] [address]    Report Status 06/04/2018 FINAL  Final     Radiology Studies: Dg Chest Port 1 View  Result Date: 06/09/2018 CLINICAL DATA:  Shortness of breath. EXAM: PORTABLE CHEST 1 VIEW COMPARISON:  Radiograph of June 06, 2018 and June 02, 2018. FINDINGS: Stable cardiomediastinal silhouette. Status post coronary bypass graft. Atherosclerosis of thoracic aorta is noted. No pneumothorax is noted. Lungs are clear. No significant pleural effusion is noted. There is recurrence of pneumoperitoneum underneath the right hemidiaphragm. Bony thorax is unremarkable. IMPRESSION: No acute cardiopulmonary abnormality seen. Recurrence  of pneumoperitoneum is noted under right hemidiaphragm concerning for postoperative change or rupture of hollow viscus. These results will be called to the ordering clinician or representative by the  Radiologist Assistant, and communication documented in the PACS or zVision Dashboard. Aortic Atherosclerosis (ICD10-I70.0). Electronically Signed   By: Marijo Conception, M.D.   On: 06/09/2018 11:59    Scheduled Meds: . aspirin EC  81 mg Oral Daily  . dextromethorphan-guaiFENesin  1 tablet Oral BID  . enalapril  2.5 mg Oral BID  . enoxaparin (LOVENOX) injection  40 mg Subcutaneous Q24H  . feeding supplement (ENSURE ENLIVE)  237 mL Oral Q24H  . gabapentin  100 mg Oral BID  . metoprolol succinate  50 mg Oral Daily  . multivitamin with minerals  1 tablet Oral Daily  . pantoprazole  40 mg Oral Daily  . polyethylene glycol  17 g Oral Daily  . potassium chloride  40 mEq Oral BID  . pravastatin  40 mg Oral QHS   Continuous Infusions: . sodium chloride 250 mL (06/09/18 1148)  . ceFEPime (MAXIPIME) IV 1 g (06/09/18 1304)  . [START ON 06/10/2018] vancomycin       LOS: 4 days   Marylu Lund, MD [facility] Pager [phone]  If 7PM-7AM, please contact night-coverage [email or web address] Password [email or web address] 06/09/2018, 3:09 PM

## 2018-06-09 NOTE — Progress Notes (Signed)
OT Cancellation Note  Patient Details Name: RILAN EILAND MRN: [id number] DOB: 10/28/1931   Cancelled Treatment:    Reason Eval/Treat Not Completed: Fatigue/lethargy limiting ability to participate.  Pt sleeping soundly. Aroused but could not maintain this  Ladoris Lythgoe 06/09/2018, 3:33 PM  Lesle Chris, OTR/L [phone] 06/09/2018

## 2018-06-09 NOTE — Consult Note (Signed)
   [facility] Inpatient Consult   06/09/2018  FAHIM KATS [birth year] [id number]    [facility] Care Management follow up.  Chart reviewed. Noted Mr. Pistilli discharge plan is for SNF. Will make referral to [place] for follow up while at [facility]. It appears Mr. Delrossi will be going to [facility]. Will update [facility] NP as well.   Marthenia Rolling, MSN-Ed, RN,BSN [facility] Liaison [phone]

## 2018-06-09 NOTE — Progress Notes (Signed)
  [facility] Progress Note     Subjective: CC-  In good spirit this morning. Denies any abdominal pain, nausea, or vomiting. Tolerating diet but does not have much of an appetite. Had a BM this morning.  Objective: Vital signs in last 24 hours: Temp:  [98.7 F (37.1 C)-100.7 F (38.2 C)] 99.8 F (37.7 C) (08/30 0441) Pulse Rate:  [98-107] 107 (08/30 0441) Resp:  [18-24] 18 (08/30 0441) BP: (110-117)/(66-68) 110/66 (08/30 0441) SpO2:  [88 %-96 %] 93 % (08/30 0441) Weight:  [95.7 kg] 95.7 kg (08/30 0441) Last BM Date: 06/06/18  Intake/Output from previous day: 08/29 0701 - 08/30 0700 In: 240 [P.O.:240] Out: 1160 [Urine:1160] Intake/Output this shift: Total I/O In: 960 [P.O.:960] Out: 100 [Urine:100]  PE: Gen:  Alert, NAD, pleasant HEENT: EOM's intact, pupils equal and round Card:  RRR, no M/G/R heard Pulm:  Diffuse rhonchi, no wheezing, effort normal Skin: no rashes noted, warm and dry Abd: Soft, nontender, +BS, midline incision healed except for 3x3x1cm area at distal aspect, slough noted at base, tunnels about 5cm proximally     Lab Results:  Recent Labs    06/08/18 0421 06/09/18 0420  WBC 8.4 11.4*  HGB 10.4* 10.8*  HCT 33.9* 35.1*  PLT 365 398   BMET Recent Labs    06/08/18 0421 06/09/18 0420  NA 141 140  K 3.7 3.9  CL 98 95*  CO2 37* 35*  GLUCOSE 123* 155*  BUN 14 15  CREATININE 1.00 1.03  CALCIUM 8.7* 8.5*   PT/INR No results for input(s): LABPROT, INR in the last 72 hours. CMP     Component Value Date/Time   NA 140 06/09/2018 0420   NA 139 01/17/2018 1401   K 3.9 06/09/2018 0420   CL 95 (L) 06/09/2018 0420   CO2 35 (H) 06/09/2018 0420   GLUCOSE 155 (H) 06/09/2018 0420   BUN 15 06/09/2018 0420   BUN 13 01/17/2018 1401   CREATININE 1.03 06/09/2018 0420   CALCIUM 8.5 (L) 06/09/2018 0420   PROT 5.7 (L) 06/02/2018 1700   PROT 6.4 01/17/2018 1401   ALBUMIN 2.3 (L) 06/02/2018 1700   ALBUMIN 4.2 01/17/2018 1401   AST 47  (H) 06/02/2018 1700   ALT 34 06/02/2018 1700   ALKPHOS 121 06/02/2018 1700   BILITOT 0.5 06/02/2018 1700   BILITOT 0.3 01/17/2018 1401   GFRNONAA >60 06/09/2018 0420   GFRAA >60 06/09/2018 0420   Lipase     Component Value Date/Time   LIPASE 103 (H) 06/02/2018 1801       Studies/Results: No results found.  Anti-infectives: Anti-infectives (From admission, onward)   None       Assessment/Plan Obstructing colon mass/adenocarcinoma 1/8positive nodes Oncology following Postop ileus-discharge 05/30/2018 S/P exploratory laparotomy, right hemicolectomy with primary anastomosis, 05/19/2018, Dr. Fanny Skates  History of alcohol use Hx of hypertension Hx of hyperlipidemia  Readmit 05/28/5630 Acute diastolic congestive heart failure exacerbation-postop Atrial fibrillation with history of RVR-no anticoagulation secondary to prior bleeding Failure to thrive/deconditioning  FEN: IV@KVO /Heart healthy/carb mod diet, Ensure ID: None Follow up: Dr. Waynetta Pean, DO - PCP DVT: Lovenox  Plan: CHF per medicine. Increase midline dressing changes to BID. Ok to shower with wound open. Continue mobilizing.    LOS: 4 days    Wellington [place] , [facility] Surgery 06/09/2018, 9:20 AM Pager: [phone] Consults: [phone] Mon 7:00 am -11:30 AM Tues-Fri 7:00 am-4:30 pm Sat-Sun 7:00 am-11:30 am

## 2018-06-09 NOTE — Progress Notes (Signed)
Pharmacy Antibiotic Note  Vincent Mcclure is a 84 y.o. male admitted on 06/02/2018 with pneumonia.  Pharmacy has been consulted for Vanc/Cefepime dosing for new onset HAP, fever overnight, rising WBC  Plan: 1) Vancomycin 2g IV x 1 then 1500mg  IV q24 - goal AUC 400-500 2) Cefepime 1g IV q8 3) Monitor cultures, renal functions, fever  Height: 5\' 11"  (180.3 cm) Weight: 210 lb 15.9 oz (95.7 kg) IBW/kg (Calculated) : 75.3  Temp (24hrs), Avg:99.7 F (37.6 C), Min:98.7 F (37.1 C), Max:100.7 F (38.2 C)  Recent Labs  Lab 06/02/18 1816  06/05/18 0613 06/06/18 0500 06/07/18 0416 06/08/18 0421 06/09/18 0420  WBC  --    < > 7.1 10.3 8.3 8.4 11.4*  CREATININE  --    < > 0.89 0.94 0.89 1.00 1.03  LATICACIDVEN 1.50  --   --   --   --   --   --    < > = values in this interval not displayed.    Estimated Creatinine Clearance: 63.1 mL/min (by C-G formula based on SCr of 1.03 mg/dL).    Allergies  Allergen Reactions  . No Known Allergies     Antimicrobials this admission: 8/30 vanc >> 8/30 cefepime >>  Microbiology results: 8/30 BCx: ordered 8/30 UCx: ordered   Thank you for allowing pharmacy to be a part of this patient's care.  Kara Mead 06/09/2018 10:49 AM

## 2018-06-10 LAB — BASIC METABOLIC PANEL
ANION GAP: 11 (ref 5–15)
BUN: 18 mg/dL (ref 8–23)
CO2: 32 mmol/L (ref 22–32)
Calcium: 8.3 mg/dL — ABNORMAL LOW (ref 8.9–10.3)
Chloride: 96 mmol/L — ABNORMAL LOW (ref 98–111)
Creatinine, Ser: 0.93 mg/dL (ref 0.61–1.24)
GFR calc Af Amer: >60 mL/min (ref >60)
GFR calc non Af Amer: >60 mL/min (ref >60)
Glucose, Bld: 117 mg/dL — ABNORMAL HIGH (ref 70–99)
POTASSIUM: 4.2 mmol/L (ref 3.5–5.1)
SODIUM: 139 mmol/L (ref 135–145)

## 2018-06-10 LAB — CBC
HCT: 32.7 % — ABNORMAL LOW (ref 39.0–52.0)
HEMOGLOBIN: 10.1 g/dL — AB (ref 13.0–17.0)
MCH: 27.9 pg (ref 26.0–34.0)
MCHC: 30.9 g/dL (ref 30.0–36.0)
MCV: 90.3 fL (ref 78.0–100.0)
PLATELETS: 341 10*3/uL (ref 150–400)
RBC: 3.62 MIL/uL — ABNORMAL LOW (ref 4.22–5.81)
RDW: 14.5 % (ref 11.5–15.5)
WBC: 11 10*3/uL — ABNORMAL HIGH (ref 4.0–10.5)

## 2018-06-10 MED ORDER — FUROSEMIDE 10 MG/ML IJ SOLN
40.0000 mg | Freq: Every day | INTRAMUSCULAR | Status: DC
  Administered 2018-06-10 – 2018-06-15 (×6): 40 mg via INTRAVENOUS
  Filled 2018-06-10 (×6): qty 4

## 2018-06-10 NOTE — Progress Notes (Signed)
PROGRESS NOTE    Vincent Mcclure  MHD:[id number] DOB: 03-Mar-1932 DOA: 06/02/2018 PCP: [name], DO    Brief Narrative:  84 year old male with medical history significant for CAD, colon adenocarcinoma, history of alcohol abuse, A. fib, hypertension, status post ex lap and right colectomy with primary anastomosis on 8/9 due to adenocarcinoma.  Patient presented to the surgical clinic postop eval where he was found to be weak, short of breath and congested, subsequently sent to the emergency department where he was evaluated by EDP and surgical team and was admitted with working diagnosis of suspected CHF and pneumoperitoneum.  Assessment & Plan:   Active Problems:   HLD (hyperlipidemia)   HTN (hypertension)   GERD   h/o Anemia due to blood loss, acute   Alcohol use/ h/o heavy abuse   s/p NSTEMI (non-ST elevated myocardial infarction); with subsequent urgent CABG  5/18 ([place])   PAF (paroxysmal atrial fibrillation) ([place])   Cancer of ascending colon [id number] s/p right colectomy 05/19/2018   Congestive heart failure (CHF) ([name])   Hypokalemia   Atrial fibrillation with RVR ([name])   Hypoalbuminemia   Subacute confusional state   CHF (congestive heart failure) ([name])  Acute respiratory failure with hypoxia due to CHF exacerbation, HCAP with sepsis not present on admission -Currently remains on [facility] -Recently improved with scheduled lasix. Edema has improved -Patient with fevers, increased sob, mildly elevated WBC, increased secretions worrisome for PNA with sepsis. Given sepsis, had held lasix -Continued on vancomycin and cefepime to cover HCAP -WBC trending down. Clinically appears improved -O2 sats noted to drop to the 80's while asleep, improved when awake using flutter valve. Thick secretions noted -Continue guaifenesin, continue flutter valve -Check CBC in AM  Acute on chronic diastolic CHF -2d echo with findings of EF of 55%, with mild pulmonary hypertension and grade 1  diastolic dysfunction.   -Improvement noted following IV lasix -Pitting edema noted in distal LE. Will resume IV lasix  Chronic A. fib -Rate controlled, chads Vasc score 5 noted.   -Patient currently not on anticoagulation prior to admission due to GI bleed  -Case discussed with General Surgery who did clear patient for anticoagulation, however in presence of pharmacist, nursing, patient has strongly declined anticoagulation and verbalized understanding of risk of stroke in the future. -Will continue patient on ASA for secondary stroke prevention instead -Will continue with current beta blocker regimen as tolerated -Overall stable at present  Pneumoperitoneum -Felt to be related to recent colectomy on 05/19/2018 for adenoCA -Again noted on CXR on 8/30 -Continue plan as per General Surgery.  Alcohol abuse -No evidence of ETOH withdrawals thus far -Encourage ETOH avoidance when discharged  Hypokalemia -Patient is continued on scheduled K. Dur 40 mEq daily due to persistent hypokalemia and patient on Lasix.   -Labs reviewed. K is within normal limits -Repeat bmet in AM  DVT prophylaxis: Lovenox subq Code Status: Full Family Communication: Pt in room, family not at bedside Disposition Plan: Plan for SNF, when fully diuresed  Consultants:   General Surgery  Procedures:     Antimicrobials: Anti-infectives (From admission, onward)   Start     Dose/Rate Route Frequency Ordered Stop   06/10/18 1200  vancomycin (VANCOCIN) 1,500 mg in sodium chloride 0.9 % 500 mL IVPB     1,500 mg 250 mL/hr over 120 Minutes Intravenous Every 24 hours 06/09/18 1055     06/09/18 1200  ceFEPIme (MAXIPIME) 1 g in sodium chloride 0.9 % 100 mL IVPB  1 g 200 mL/hr over 30 Minutes Intravenous Every 8 hours 06/09/18 1023     06/09/18 1100  vancomycin (VANCOCIN) 2,000 mg in sodium chloride 0.9 % 500 mL IVPB     2,000 mg 250 mL/hr over 120 Minutes Intravenous  Once 06/09/18 1023 06/09/18 1349       Subjective: Feeling better today  Objective: Vitals:   06/08/18 2037 06/09/18 0441 06/09/18 1331 06/10/18 0511  BP: 114/68 110/66 111/64 109/66  Pulse: (!) 106 (!) 107 98   Resp: 20 18 20 18   Temp: (!) 100.7 F (38.2 C) 99.8 F (37.7 C) 99.4 F (37.4 C) 98 F (36.7 C)  TempSrc: Oral Oral Oral   SpO2: 96% 93% 98%   Weight:  95.7 kg    Height:        Intake/Output Summary (Last 24 hours) at 06/10/2018 1211 Last data filed at 06/10/2018 0935 Gross per 24 hour  Intake 1565.53 ml  Output 200 ml  Net 1365.53 ml   Filed Weights   06/07/18 0519 06/08/18 0446 06/09/18 0441  Weight: 96.2 kg 95.7 kg 95.7 kg    Examination: General exam: Awake, laying in bed, in nad Respiratory system: Normal respiratory effort, no wheezing Cardiovascular system: regular rate, s1, s2 Gastrointestinal system: Soft, nondistended, positive BS Central nervous system: CN2-12 grossly intact, strength intact Extremities: Perfused, no clubbing, pitting distal LE edema Skin: Normal skin turgor, no notable skin lesions seen Psychiatry: Mood normal // no visual hallucinations   Data Reviewed: I have personally reviewed following labs and imaging studies  CBC: Recent Labs  Lab 06/06/18 0500 06/07/18 0416 06/08/18 0421 06/09/18 0420 06/10/18 0351  WBC 10.3 8.3 8.4 11.4* 11.0*  HGB 10.3* 10.1* 10.4* 10.8* 10.1*  HCT 33.0* 32.7* 33.9* 35.1* 32.7*  MCV 89.9 90.3 91.1 90.7 90.3  PLT 435* 376 365 398 510   Basic Metabolic Panel: Recent Labs  Lab 06/05/18 0613 06/06/18 0500 06/07/18 0416 06/08/18 0421 06/09/18 0420 06/10/18 0351  NA 141 140 143 141 140 139  K 3.5 3.6 3.1* 3.7 3.9 4.2  CL 97* 100 100 98 95* 96*  CO2 35* 33* 36* 37* 35* 32  GLUCOSE 142* 149* 117* 123* 155* 117*  BUN 11 13 12 14 15 18   CREATININE 0.89 0.94 0.89 1.00 1.03 0.93  CALCIUM 8.3* 8.4* 8.4* 8.7* 8.5* 8.3*  MG 2.4 2.1 1.9 2.0 1.8  --    GFR: Estimated Creatinine Clearance: 69.8 mL/min (by C-G formula based on  SCr of 0.93 mg/dL). Liver Function Tests: No results for input(s): AST, ALT, ALKPHOS, BILITOT, PROT, ALBUMIN in the last 168 hours. No results for input(s): LIPASE, AMYLASE in the last 168 hours. No results for input(s): AMMONIA in the last 168 hours. Coagulation Profile: No results for input(s): INR, PROTIME in the last 168 hours. Cardiac Enzymes: No results for input(s): CKTOTAL, CKMB, CKMBINDEX, TROPONINI in the last 168 hours. BNP (last 3 results) No results for input(s): PROBNP in the last 8760 hours. HbA1C: No results for input(s): HGBA1C in the last 72 hours. CBG: No results for input(s): GLUCAP in the last 168 hours. Lipid Profile: No results for input(s): CHOL, HDL, LDLCALC, TRIG, CHOLHDL, LDLDIRECT in the last 72 hours. Thyroid Function Tests: No results for input(s): TSH, T4TOTAL, FREET4, T3FREE, THYROIDAB in the last 72 hours. Anemia Panel: No results for input(s): VITAMINB12, FOLATE, FERRITIN, TIBC, IRON, RETICCTPCT in the last 72 hours. Sepsis Labs: Recent Labs  Lab 06/09/18 1101  PROCALCITON 0.21    Recent Results (  from the past 240 hour(s))  Culture, blood (routine x 2)     Status: None   Collection Time: 06/02/18  6:01 PM  Result Value Ref Range Status   Specimen Description   Final    BLOOD RIGHT ANTECUBITAL Performed at [facility] [address], [place], [place] [address]    Special Requests   Final    BOTTLES DRAWN AEROBIC AND ANAEROBIC Blood Culture adequate volume Performed at [facility] [address], [place], [place] [address]    Culture   Final    NO GROWTH 5 DAYS Performed at [facility], [place] [address] [place], [place] [address]    Report Status 06/07/2018 FINAL  Final  Culture, blood (routine x 2)     Status: None   Collection Time: 06/02/18  6:06 PM  Result Value Ref Range Status   Specimen Description   Final    BLOOD LEFT ANTECUBITAL Performed at [facility] [address] [place], [place] [address]    Special Requests   Final    BOTTLES DRAWN AEROBIC AND ANAEROBIC Blood Culture adequate volume Performed at [facility] [address] [place], [place] [address]    Culture   Final    NO GROWTH 5 DAYS Performed at [facility], [place] [address] [place], [place] [address]    Report Status 06/07/2018 FINAL  Final  Urine culture     Status: None   Collection Time: 06/03/18 10:05 AM  Result Value Ref Range Status   Specimen Description   Final    URINE, CLEAN CATCH Performed at [facility], [place] [address], [place], [name] [address]    Special Requests   Final    NONE Performed at [facility], [place] [address], [place],  [address]    Culture   Final    NO GROWTH Performed at [facility], [place] [address], [place],  [address]    Report Status 06/04/2018 FINAL  Final     Radiology Studies: Dg Chest Port 1 View  Result Date: 06/09/2018 CLINICAL DATA:  Shortness of breath. EXAM: PORTABLE CHEST 1 VIEW COMPARISON:  Radiograph of June 06, 2018 and June 02, 2018. FINDINGS: Stable cardiomediastinal silhouette. Status post coronary bypass graft. Atherosclerosis of thoracic aorta is noted. No pneumothorax is noted. Lungs are clear. No significant pleural effusion is noted. There is recurrence of pneumoperitoneum underneath the right hemidiaphragm. Bony thorax is unremarkable. IMPRESSION: No acute cardiopulmonary abnormality seen. Recurrence of pneumoperitoneum is noted under right hemidiaphragm concerning for postoperative change or rupture of hollow viscus. These results will be called to the ordering clinician or representative by the Radiologist Assistant, and communication documented in the PACS or zVision Dashboard. Aortic Atherosclerosis (ICD10-I70.0). Electronically Signed   By: Marijo Conception, M.D.   On: 06/09/2018 11:59    Scheduled Meds: . aspirin EC  81 mg  Oral Daily  . dextromethorphan-guaiFENesin  1 tablet Oral BID  . enalapril  2.5 mg Oral BID  . enoxaparin (LOVENOX) injection  40 mg Subcutaneous Q24H  . feeding supplement (ENSURE ENLIVE)  237 mL Oral Q24H  . gabapentin  100 mg Oral BID  . metoprolol succinate  50 mg Oral Daily  . multivitamin with minerals  1 tablet Oral Daily  . pantoprazole  40 mg Oral Daily  . polyethylene glycol  17 g Oral Daily  . potassium chloride  40 mEq  Oral BID  . pravastatin  40 mg Oral QHS   Continuous Infusions: . sodium chloride Stopped (06/09/18 2138)  . ceFEPime (MAXIPIME) IV 1 g (06/10/18 1202)  . vancomycin       LOS: 5 days   Marylu Lund, MD [facility] Pager [phone]  If 7PM-7AM, please contact night-coverage [email or web address] Password [facility] 06/10/2018, 12:11 PM

## 2018-06-11 LAB — CBC
HCT: 34 % — ABNORMAL LOW (ref 39.0–52.0)
Hemoglobin: 10.2 g/dL — ABNORMAL LOW (ref 13.0–17.0)
MCH: 27.8 pg (ref 26.0–34.0)
MCHC: 30 g/dL (ref 30.0–36.0)
MCV: 92.6 fL (ref 78.0–100.0)
Platelets: 332 10*3/uL (ref 150–400)
RBC: 3.67 MIL/uL — ABNORMAL LOW (ref 4.22–5.81)
RDW: 14.7 % (ref 11.5–15.5)
WBC: 10.7 10*3/uL — AB (ref 4.0–10.5)

## 2018-06-11 LAB — BASIC METABOLIC PANEL
Anion gap: 10 (ref 5–15)
BUN: 19 mg/dL (ref 8–23)
CALCIUM: 8.6 mg/dL — AB (ref 8.9–10.3)
CO2: 31 mmol/L (ref 22–32)
CREATININE: 0.97 mg/dL (ref 0.61–1.24)
Chloride: 100 mmol/L (ref 98–111)
GFR calc non Af Amer: >60 mL/min (ref >60)
Glucose, Bld: 110 mg/dL — ABNORMAL HIGH (ref 70–99)
Potassium: 4.5 mmol/L (ref 3.5–5.1)
SODIUM: 141 mmol/L (ref 135–145)

## 2018-06-11 LAB — MRSA PCR SCREENING: MRSA BY PCR: NEGATIVE

## 2018-06-11 MED ORDER — LABETALOL HCL 5 MG/ML IV SOLN
5.0000 mg | INTRAVENOUS | Status: DC | PRN
  Filled 2018-06-11: qty 4

## 2018-06-11 MED ORDER — METOPROLOL TARTRATE 25 MG PO TABS
25.0000 mg | ORAL_TABLET | ORAL | Status: AC
  Administered 2018-06-11: 25 mg via ORAL
  Filled 2018-06-11: qty 1

## 2018-06-11 MED ORDER — METOPROLOL TARTRATE 50 MG PO TABS
75.0000 mg | ORAL_TABLET | Freq: Two times a day (BID) | ORAL | Status: DC
  Administered 2018-06-11 – 2018-06-17 (×12): 75 mg via ORAL
  Filled 2018-06-11 (×12): qty 1

## 2018-06-11 NOTE — Progress Notes (Signed)
Patient is refusing soap suds enema at this time.  Patient reports he does not feel constipated tonight and has had no issues passing stool today.  Patient will let this RN know if he decides to get enema later this shift.Vincent Mcclure

## 2018-06-11 NOTE — Progress Notes (Signed)
Assisted pt to BR. Prior to ambulation sats mid 90's with HR 108, and fluctuating on O2 3L/[place]. Dangled on side of bed without O2, sats dropped to high 80's-low 90's. Offered BSC yet pt demanded to go to BR. Oriented w/no c/o of dizziness. Congestion in upper airway noted yet no acute distress. Ambulated to BR w/standby assist. Slight unsteady gait. Linen changed. Pt back to bed. RA sat @80 % with HR 115. No acute distress. O2 @ 3L returned per [place]. Sats increased to 95%.

## 2018-06-11 NOTE — Progress Notes (Signed)
Patient is refusing soap suds enema. Patient reports that he is having bowel movements an does not feel as if he needs an enema. Patient will let RN know if he feels that he needs his enema in the morning. Will continue to check with patient.  Reinaldo Berber, RN

## 2018-06-11 NOTE — Progress Notes (Signed)
Patient ID: Vincent Mcclure, male   DOB: [birth year], 84 y.o.   MRN: [id number] [facility] Progress Note:   * No surgery found *  Subjective: Mental status is fairly clear-responds to questions appropriately Objective: Vital signs in last 24 hours: Temp:  [97.5 F (36.4 C)-98.9 F (37.2 C)] 98.9 F (37.2 C) (09/01 0442) Pulse Rate:  [52-110] 110 (09/01 0442) Resp:  [14-18] 18 (09/01 0442) BP: (102-119)/(56-79) 102/79 (09/01 0442) SpO2:  [98 %-99 %] 98 % (09/01 0442) Weight:  [96.2 kg] 96.2 kg (09/01 0500)  Intake/Output from previous day: 08/31 0701 - 09/01 0700 In: 1880 [P.O.:1080; IV Piggyback:800] Out: 1100 [Urine:1100] Intake/Output this shift: No intake/output data recorded.  Physical Exam: Work of breathing is impacted by his underlying CHF.  He has a wound that is being repacked by nursing-recent change reported as clean.    Lab Results:  Results for orders placed or performed during the hospital encounter of 06/02/18 (from the past 48 hour(s))  Culture, blood (x 2)     Status: None (Preliminary result)   Collection Time: 06/09/18 11:01 AM  Result Value Ref Range   Specimen Description      BLOOD RIGHT ANTECUBITAL Performed at [facility] [address] [place], [place] [address]    Special Requests      BOTTLES DRAWN AEROBIC AND ANAEROBIC Blood Culture adequate volume Performed at [facility] [address] [place], [place] [address]    Culture      NO GROWTH 2 DAYS Performed at [facility], [place] [address], [place], [place] [address]    Report Status PENDING   Procalcitonin     Status: None   Collection Time: 06/09/18 11:01 AM  Result Value Ref Range   Procalcitonin 0.21 ng/mL    Comment:        Interpretation: PCT (Procalcitonin) <= 0.5 ng/mL: Systemic infection (sepsis) is not likely. Local bacterial infection is possible. (NOTE)       Sepsis PCT Algorithm           Lower Respiratory Tract                                   Infection PCT Algorithm    ----------------------------     ----------------------------         PCT < 0.25 ng/mL                PCT < 0.10 ng/mL         Strongly encourage             Strongly discourage   discontinuation of antibiotics    initiation of antibiotics    ----------------------------     -----------------------------       PCT 0.25 - 0.50 ng/mL            PCT 0.10 - 0.25 ng/mL               OR       >80% decrease in PCT            Discourage initiation of                                            antibiotics      Encourage discontinuation  of antibiotics    ----------------------------     -----------------------------         PCT >= 0.50 ng/mL              PCT 0.26 - 0.50 ng/mL               AND        <80% decrease in PCT             Encourage initiation of                                             antibiotics       Encourage continuation           of antibiotics    ----------------------------     -----------------------------        PCT >= 0.50 ng/mL                  PCT > 0.50 ng/mL               AND         increase in PCT                  Strongly encourage                                      initiation of antibiotics    Strongly encourage escalation           of antibiotics                                     -----------------------------                                           PCT <= 0.25 ng/mL                                                 OR                                        > 80% decrease in PCT                                     Discontinue / Do not initiate                                             antibiotics Performed at [facility] [address], [place], [place] [address]   Culture, blood (x 2)     Status: None (Preliminary result)   Collection Time: 06/09/18 11:06 AM  Result Value Ref Range   Specimen Description  BLOOD RIGHT FOREARM Performed at [facility] [address] [place], [place] [address]    Special Requests      BOTTLES DRAWN AEROBIC AND ANAEROBIC Blood Culture adequate volume Performed at [facility] [address] [place], [place] [address]    Culture      NO GROWTH 2 DAYS Performed at [facility], [place] [address], [place], [place] [address]    Report Status PENDING   Blood gas, arterial     Status: Abnormal   Collection Time: 06/09/18  4:03 PM  Result Value Ref Range   O2 Content 4.0 L/min   pH, Arterial 7.395 7.350 - 7.450   pCO2 arterial 63.5 (H) 32.0 - 48.0 mmHg   pO2, Arterial 77.8 (L) 83.0 - 108.0 mmHg   Bicarbonate 38.0 (H) 20.0 - 28.0 mmol/L   Acid-Base Excess 11.5 (H) 0.0 - 2.0 mmol/L   O2 Saturation 95.6 %   Patient temperature 98.6    Collection site RIGHT RADIAL    Drawn by [id number]    Sample type ARTERIAL DRAW    Allens test (pass/fail) PASS PASS    Comment: Performed at [facility], [place] [address], [place], [place] [address]  Basic metabolic panel     Status: Abnormal   Collection Time: 06/10/18  3:51 AM  Result Value Ref Range   Sodium 139 135 - 145 mmol/L   Potassium 4.2 3.5 - 5.1 mmol/L   Chloride 96 (L) 98 - 111 mmol/L   CO2 32 22 - 32 mmol/L   Glucose, Bld 117 (H) 70 - 99 mg/dL   BUN 18 8 - 23 mg/dL   Creatinine, Ser 0.93 0.61 - 1.24 mg/dL   Calcium 8.3 (L) 8.9 - 10.3 mg/dL   GFR calc non Af Amer >60 >60 mL/min   GFR calc Af Amer >60 >60 mL/min    Comment: (NOTE) The eGFR has been calculated using the CKD EPI equation. This calculation has not been validated in all clinical situations. eGFR's persistently <60 mL/min signify possible Chronic Kidney Disease.    Anion gap 11 5 - 15    Comment: Performed at [facility], [place] [address], [place], [place] [address]  CBC     Status: Abnormal   Collection Time: 06/10/18  3:51 AM  Result Value Ref Range   WBC 11.0 (H) 4.0 - 10.5 K/uL   RBC 3.62 (L) 4.22 - 5.81  MIL/uL   Hemoglobin 10.1 (L) 13.0 - 17.0 g/dL   HCT 32.7 (L) 39.0 - 52.0 %   MCV 90.3 78.0 - 100.0 fL   MCH 27.9 26.0 - 34.0 pg   MCHC 30.9 30.0 - 36.0 g/dL   RDW 14.5 11.5 - 15.5 %   Platelets 341 150 - 400 K/uL    Comment: Performed at [facility], [place] [address], [place], [place] [address]  Basic metabolic panel     Status: Abnormal   Collection Time: 06/11/18  5:04 AM  Result Value Ref Range   Sodium 141 135 - 145 mmol/L   Potassium 4.5 3.5 - 5.1 mmol/L   Chloride 100 98 - 111 mmol/L   CO2 31 22 - 32 mmol/L   Glucose, Bld 110 (H) 70 - 99 mg/dL   BUN 19 8 - 23 mg/dL   Creatinine, Ser 0.97 0.61 - 1.24 mg/dL   Calcium 8.6 (L) 8.9 - 10.3 mg/dL   GFR calc non Af Amer >60 >60  mL/min   GFR calc Af Amer >60 >60 mL/min    Comment: (NOTE) The eGFR has been calculated using the CKD EPI equation. This calculation has not been validated in all clinical situations. eGFR's persistently <60 mL/min signify possible Chronic Kidney Disease.    Anion gap 10 5 - 15    Comment: Performed at [facility], [place] [address] [place],  [address]  CBC     Status: Abnormal   Collection Time: 06/11/18  5:04 AM  Result Value Ref Range   WBC 10.7 (H) 4.0 - 10.5 K/uL   RBC 3.67 (L) 4.22 - 5.81 MIL/uL   Hemoglobin 10.2 (L) 13.0 - 17.0 g/dL   HCT 34.0 (L) 39.0 - 52.0 %   MCV 92.6 78.0 - 100.0 fL   MCH 27.8 26.0 - 34.0 pg   MCHC 30.0 30.0 - 36.0 g/dL   RDW 14.7 11.5 - 15.5 %   Platelets 332 150 - 400 K/uL    Comment: Performed at [facility], [place] [address], [place],  [address]    Radiology/Results: Dg Chest Port 1 View  Result Date: 06/09/2018 CLINICAL DATA:  Shortness of breath. EXAM: PORTABLE CHEST 1 VIEW COMPARISON:  Radiograph of June 06, 2018 and June 02, 2018. FINDINGS: Stable cardiomediastinal silhouette. Status post coronary bypass graft. Atherosclerosis of thoracic aorta is noted. No pneumothorax is noted. Lungs are  clear. No significant pleural effusion is noted. There is recurrence of pneumoperitoneum underneath the right hemidiaphragm. Bony thorax is unremarkable. IMPRESSION: No acute cardiopulmonary abnormality seen. Recurrence of pneumoperitoneum is noted under right hemidiaphragm concerning for postoperative change or rupture of hollow viscus. These results will be called to the ordering clinician or representative by the Radiologist Assistant, and communication documented in the PACS or zVision Dashboard. Aortic Atherosclerosis (ICD10-I70.0). Electronically Signed   By: Marijo Conception, M.D.   On: 06/09/2018 11:59    Anti-infectives: Anti-infectives (From admission, onward)   Start     Dose/Rate Route Frequency Ordered Stop   06/10/18 1200  vancomycin (VANCOCIN) 1,500 mg in sodium chloride 0.9 % 500 mL IVPB     1,500 mg 250 mL/hr over 120 Minutes Intravenous Every 24 hours 06/09/18 1055     06/09/18 1200  ceFEPIme (MAXIPIME) 1 g in sodium chloride 0.9 % 100 mL IVPB     1 g 200 mL/hr over 30 Minutes Intravenous Every 8 hours 06/09/18 1023     06/09/18 1100  vancomycin (VANCOCIN) 2,000 mg in sodium chloride 0.9 % 500 mL IVPB     2,000 mg 250 mL/hr over 120 Minutes Intravenous  Once 06/09/18 1023 06/09/18 1349      Assessment/Plan: Problem List: Patient Active Problem List   Diagnosis Date Noted  . CHF (congestive heart failure) ([place]) 06/06/2018  . Subacute confusional state 06/05/2018  . Hypoalbuminemia 06/03/2018  . Congestive heart failure (CHF) ([place]) 06/02/2018  . Hypokalemia 06/02/2018  . Atrial fibrillation with RVR ([place]) 06/02/2018  . Cancer of ascending colon [id number] s/p right colectomy 05/19/2018 05/24/2018  . CAD (coronary artery disease), native coronary artery 03/14/2017  . PAF (paroxysmal atrial fibrillation) ([place])   . 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 s/p NSTEMI (non-ST elevated myocardial infarction); with subsequent urgent CABG  5/18 ([place]) 02/12/2017  . Chest pain 02/11/2017  .  Overweight (BMI 25.0-29.9) 09/29/2016  . Alcohol use/ h/o heavy abuse 08/11/2016  . Left shoulder pain 01/24/2015  . h/o Anemia due to blood loss, acute 11/14/2014  . Eye  pain   . Acute esophagitis   . Acute gastric ulcer   . Duodenal ulcer disease   . Upper GI bleed 11/13/2014  . Lumbar disc disease 01/30/2014  . Hearing loss 03/23/2013  . Screening for other and unspecified cardiovascular conditions 03/23/2013  . PAD (peripheral artery disease) ([place]) 10/14/2011  . Chronic low back pain 10/14/2011  . Glucose intolerance (impaired glucose tolerance) 10/12/2011  . Preventative health care 10/12/2011  . LEG CRAMPS 09/02/2010  . PERIPHERAL EDEMA 09/02/2010  . VARICOSE VEINS, LOWER EXTREMITIES 06/09/2009  . HLD (hyperlipidemia) 03/05/2008  . Anemia, iron deficiency 03/05/2008  . Hereditary and idiopathic peripheral neuropathy 03/05/2008  . HTN (hypertension) 03/05/2008  . GERD 03/05/2008  . IBS 03/05/2008  . Blood in stool 03/05/2008  . COLONIC POLYPS, HX OF 03/05/2008    PRN check of patient who is one month out from colectomy with abdominal incision healing by secondary intention.   * No surgery found *    LOS: 6 days   Matt B. Hassell Done, MD, [facility] [phone] beeper [phone]  06/11/2018 8:32 AM

## 2018-06-11 NOTE — Progress Notes (Signed)
Current O2 sats on 3L/[place] 97% with HR 112.

## 2018-06-11 NOTE — Progress Notes (Signed)
PROGRESS NOTE    Vincent Mcclure  ACZ:[id number] DOB: 27-Feb-1932 DOA: 06/02/2018 PCP: [name], DO    Brief Narrative:  84 year old male with medical history significant for CAD, colon adenocarcinoma, history of alcohol abuse, A. fib, hypertension, status post ex lap and right colectomy with primary anastomosis on 8/9 due to adenocarcinoma.  Patient presented to the surgical clinic postop eval where he was found to be weak, short of breath and congested, subsequently sent to the emergency department where he was evaluated by EDP and surgical team and was admitted with working diagnosis of suspected CHF and pneumoperitoneum.  Assessment & Plan:   Active Problems:   HLD (hyperlipidemia)   HTN (hypertension)   GERD   h/o Anemia due to blood loss, acute   Alcohol use/ h/o heavy abuse   s/p NSTEMI (non-ST elevated myocardial infarction); with subsequent urgent CABG  5/18 ([place])   PAF (paroxysmal atrial fibrillation) ([place])   Cancer of ascending colon [id number] s/p right colectomy 05/19/2018   Congestive heart failure (CHF) ([name])   Hypokalemia   Atrial fibrillation with RVR ([name])   Hypoalbuminemia   Subacute confusional state   CHF (congestive heart failure) ([name])  Acute respiratory failure with hypoxia due to CHF exacerbation, HCAP with sepsis not present on admission -Currently remains on [facility] -Recently improved with scheduled lasix. Edema has improved -Patient with fevers, increased sob, mildly elevated WBC, increased secretions worrisome for PNA with sepsis. Given sepsis, had held lasix -Continued on vancomycin and cefepime to cover HCAP -WBC trending down. Clinically appears improved -Patient remains O2 dependent -Continue guaifenesin, some improvement with flutter valve -Repeat CBC in AM  Acute on chronic diastolic CHF -2d echo with findings of EF of 55%, with mild pulmonary hypertension and grade 1 diastolic dysfunction.   -Improvement noted with lasix -Continue with IV  lasix as tolerated  Chronic A. fib -Rate controlled, chads Vasc score 5 noted.   -Patient currently not on anticoagulation prior to admission due to GI bleed  -Case discussed with General Surgery who did clear patient for anticoagulation, however in presence of pharmacist, nursing, patient has strongly declined anticoagulation and verbalized understanding of risk of stroke in the future. -Will continue patient on ASA for secondary stroke prevention instead -Will continue with current beta blocker regimen as tolerated -Tachycardic this AM. Will increase metoprolol to 75mg  BID  Pneumoperitoneum -Felt to be related to recent colectomy on 05/19/2018 for adenoCA -Again noted on CXR on 8/30 -General Surgery following  Alcohol abuse -No evidence of ETOH withdrawals thus far -Recommend ETOH avoidance when discharged  Hypokalemia -Patient is continued on scheduled K. Dur 40 mEq daily due to persistent hypokalemia and patient on Lasix.   -Labs reviewed. K stable -Repeat bmet in AM  DVT prophylaxis: Lovenox subq Code Status: Full Family Communication: Pt in room, family not at bedside Disposition Plan: Plan for SNF, when fully diuresed  Consultants:   General Surgery  Procedures:     Antimicrobials: Anti-infectives (From admission, onward)   Start     Dose/Rate Route Frequency Ordered Stop   06/10/18 1200  vancomycin (VANCOCIN) 1,500 mg in sodium chloride 0.9 % 500 mL IVPB     1,500 mg 250 mL/hr over 120 Minutes Intravenous Every 24 hours 06/09/18 1055     06/09/18 1200  ceFEPIme (MAXIPIME) 1 g in sodium chloride 0.9 % 100 mL IVPB     1 g 200 mL/hr over 30 Minutes Intravenous Every 8 hours 06/09/18 1023     06/09/18 1100  vancomycin (VANCOCIN) 2,000 mg in sodium chloride 0.9 % 500 mL IVPB     2,000 mg 250 mL/hr over 120 Minutes Intravenous  Once 06/09/18 1023 06/09/18 1349      Subjective: Without complaints this AM  Objective: Vitals:   06/11/18 0442 06/11/18 0500  06/11/18 1026 06/11/18 1540  BP: 102/79  122/60 124/64  Pulse: (!) 110  (!) 110 92  Resp: 18  20 20   Temp: 98.9 F (37.2 C)  99.7 F (37.6 C) 98.7 F (37.1 C)  TempSrc: Oral  Oral Oral  SpO2: 98%  98% 96%  Weight:  96.2 kg    Height:        Intake/Output Summary (Last 24 hours) at 06/11/2018 1736 Last data filed at 06/11/2018 1400 Gross per 24 hour  Intake 1503.47 ml  Output 1600 ml  Net -96.53 ml   Filed Weights   06/08/18 0446 06/09/18 0441 06/11/18 0500  Weight: 95.7 kg 95.7 kg 96.2 kg    Examination: General exam: Conversant, in no acute distress Respiratory system: normal chest rise, clear, no audible wheezing Cardiovascular system: regular rhythm, s1-s2 Gastrointestinal system: Nondistended, nontender, pos BS Central nervous system: No seizures, no tremors Extremities: No cyanosis, no joint deformities Skin: No rashes, no pallor Psychiatry: Affect normal // no auditory hallucinations   Data Reviewed: I have personally reviewed following labs and imaging studies  CBC: Recent Labs  Lab 06/07/18 0416 06/08/18 0421 06/09/18 0420 06/10/18 0351 06/11/18 0504  WBC 8.3 8.4 11.4* 11.0* 10.7*  HGB 10.1* 10.4* 10.8* 10.1* 10.2*  HCT 32.7* 33.9* 35.1* 32.7* 34.0*  MCV 90.3 91.1 90.7 90.3 92.6  PLT 376 365 398 341 476   Basic Metabolic Panel: Recent Labs  Lab 06/05/18 0613 06/06/18 0500 06/07/18 0416 06/08/18 0421 06/09/18 0420 06/10/18 0351 06/11/18 0504  NA 141 140 143 141 140 139 141  K 3.5 3.6 3.1* 3.7 3.9 4.2 4.5  CL 97* 100 100 98 95* 96* 100  CO2 35* 33* 36* 37* 35* 32 31  GLUCOSE 142* 149* 117* 123* 155* 117* 110*  BUN 11 13 12 14 15 18 19   CREATININE 0.89 0.94 0.89 1.00 1.03 0.93 0.97  CALCIUM 8.3* 8.4* 8.4* 8.7* 8.5* 8.3* 8.6*  MG 2.4 2.1 1.9 2.0 1.8  --   --    GFR: Estimated Creatinine Clearance: 67.1 mL/min (by C-G formula based on SCr of 0.97 mg/dL). Liver Function Tests: No results for input(s): AST, ALT, ALKPHOS, BILITOT, PROT, ALBUMIN  in the last 168 hours. No results for input(s): LIPASE, AMYLASE in the last 168 hours. No results for input(s): AMMONIA in the last 168 hours. Coagulation Profile: No results for input(s): INR, PROTIME in the last 168 hours. Cardiac Enzymes: No results for input(s): CKTOTAL, CKMB, CKMBINDEX, TROPONINI in the last 168 hours. BNP (last 3 results) No results for input(s): PROBNP in the last 8760 hours. HbA1C: No results for input(s): HGBA1C in the last 72 hours. CBG: No results for input(s): GLUCAP in the last 168 hours. Lipid Profile: No results for input(s): CHOL, HDL, LDLCALC, TRIG, CHOLHDL, LDLDIRECT in the last 72 hours. Thyroid Function Tests: No results for input(s): TSH, T4TOTAL, FREET4, T3FREE, THYROIDAB in the last 72 hours. Anemia Panel: No results for input(s): VITAMINB12, FOLATE, FERRITIN, TIBC, IRON, RETICCTPCT in the last 72 hours. Sepsis Labs: Recent Labs  Lab 06/09/18 1101  PROCALCITON 0.21    Recent Results (from the past 240 hour(s))  Culture, blood (routine x 2)     Status: None  Collection Time: 06/02/18  6:01 PM  Result Value Ref Range Status   Specimen Description   Final    BLOOD RIGHT ANTECUBITAL Performed at [place] [address], [place], [place] [address]    Special Requests   Final    BOTTLES DRAWN AEROBIC AND ANAEROBIC Blood Culture adequate volume Performed at [place] [address], [place], [place] [address]    Culture   Final    NO GROWTH 5 DAYS Performed at [facility], [place] [address], [place], [place] [address]    Report Status 06/07/2018 FINAL  Final  Culture, blood (routine x 2)     Status: None   Collection Time: 06/02/18  6:06 PM  Result Value Ref Range Status   Specimen Description   Final    BLOOD LEFT ANTECUBITAL Performed at [facility] [address] [place], [place] [address]    Special Requests   Final    BOTTLES DRAWN AEROBIC AND ANAEROBIC  Blood Culture adequate volume Performed at [facility] [address] [place], [place] [address]    Culture   Final    NO GROWTH 5 DAYS Performed at [facility], [place] [address] [place], [place] [address]    Report Status 06/07/2018 FINAL  Final  Urine culture     Status: None   Collection Time: 06/03/18 10:05 AM  Result Value Ref Range Status   Specimen Description   Final    URINE, CLEAN CATCH Performed at [facility], [place] [address] [place], [place] [address]    Special Requests   Final    NONE Performed at [facility], [place] [name], [place], [place] [address]    Culture   Final    NO GROWTH Performed at [facility], [place] [address], [place], [place] [address]    Report Status 06/04/2018 FINAL  Final  Culture, blood (x 2)     Status: None (Preliminary result)   Collection Time: 06/09/18 11:01 AM  Result Value Ref Range Status   Specimen Description   Final    BLOOD RIGHT ANTECUBITAL Performed at [place] [address], [place], [place] [address]    Special Requests   Final    BOTTLES DRAWN AEROBIC AND ANAEROBIC Blood Culture adequate volume Performed at [place] [address], [place], [place] [address]    Culture   Final    NO GROWTH 2 DAYS Performed at [place] [address], [place], [place] [address]    Report Status PENDING  Incomplete  Culture, blood (x 2)     Status: None (Preliminary result)   Collection Time: 06/09/18 11:06 AM  Result Value Ref Range Status   Specimen Description   Final    BLOOD RIGHT FOREARM Performed at [facility] [address] [place], [place] [address]    Special Requests   Final    BOTTLES DRAWN AEROBIC AND ANAEROBIC Blood Culture adequate volume Performed at [place] [address], [place], [place] [address]    Culture   Final    NO GROWTH 2  DAYS Performed at [place] [address] [place],  [address]    Report Status PENDING  Incomplete  MRSA PCR Screening     Status: None   Collection Time: 06/11/18  1:48 PM  Result Value Ref Range  Status   MRSA by PCR NEGATIVE NEGATIVE Final    Comment:        The GeneXpert MRSA Assay (FDA approved for NASAL specimens only), is one component of a comprehensive MRSA colonization surveillance program. It is not intended to diagnose MRSA infection nor to guide or monitor treatment for MRSA infections. Performed at [facility], [place] [address] [place], [place] [address]      Radiology Studies: No results found.  Scheduled Meds: . aspirin EC  81 mg Oral Daily  . dextromethorphan-guaiFENesin  1 tablet Oral BID  . enoxaparin (LOVENOX) injection  40 mg Subcutaneous Q24H  . feeding supplement (ENSURE ENLIVE)  237 mL Oral Q24H  . furosemide  40 mg Intravenous Daily  . gabapentin  100 mg Oral BID  . metoprolol tartrate  75 mg Oral BID  . multivitamin with minerals  1 tablet Oral Daily  . pantoprazole  40 mg Oral Daily  . polyethylene glycol  17 g Oral Daily  . potassium chloride  40 mEq Oral BID  . pravastatin  40 mg Oral QHS   Continuous Infusions: . sodium chloride 1,000 mL (06/11/18 1701)  . ceFEPime (MAXIPIME) IV Stopped (06/11/18 1324)  . vancomycin 250 mL/hr at 06/11/18 1400     LOS: 6 days   Marylu Lund, MD [facility] Pager [phone]  If 7PM-7AM, please contact night-coverage [email or web address] Password [facility] 06/11/2018, 5:36 PM

## 2018-06-12 ENCOUNTER — Inpatient Hospital Stay (HOSPITAL_COMMUNITY): Payer: Medicare HMO

## 2018-06-12 LAB — BASIC METABOLIC PANEL
Anion gap: 6 (ref 5–15)
BUN: 19 mg/dL (ref 8–23)
CO2: 32 mmol/L (ref 22–32)
Calcium: 8.2 mg/dL — ABNORMAL LOW (ref 8.9–10.3)
Chloride: 103 mmol/L (ref 98–111)
Creatinine, Ser: 1.02 mg/dL (ref 0.61–1.24)
GFR calc non Af Amer: >60 mL/min (ref >60)
Glucose, Bld: 105 mg/dL — ABNORMAL HIGH (ref 70–99)
POTASSIUM: 4.3 mmol/L (ref 3.5–5.1)
Sodium: 141 mmol/L (ref 135–145)

## 2018-06-12 MED ORDER — IOHEXOL 300 MG/ML  SOLN
100.0000 mL | Freq: Once | INTRAMUSCULAR | Status: AC | PRN
  Administered 2018-06-12: 100 mL via INTRAVENOUS

## 2018-06-12 MED ORDER — IOPAMIDOL (ISOVUE-300) INJECTION 61%
INTRAVENOUS | Status: AC
  Filled 2018-06-12: qty 30

## 2018-06-12 MED ORDER — IOPAMIDOL (ISOVUE-300) INJECTION 61%
15.0000 mL | Freq: Two times a day (BID) | INTRAVENOUS | Status: DC | PRN
  Administered 2018-06-12: 15 mL via ORAL
  Filled 2018-06-12: qty 30

## 2018-06-12 NOTE — Progress Notes (Signed)
During bedside report patient had nasal cannula off. O2 sats were ranging from 83-88%.Course crackles throughout lung fields. Resumed 3L O2 , elevated head of bed and after a couple of mins oxygen sats were in the high 90's.

## 2018-06-12 NOTE — Progress Notes (Signed)
PT Cancellation Note  Patient Details Name: Vincent Mcclure MRN: [id number] DOB: 1932/07/10   Cancelled Treatment:    Reason Eval/Treat Not Completed: Patient at procedure or test/unavailable(CT)   [facility] 06/12/2018, 3:35 PM

## 2018-06-12 NOTE — Progress Notes (Signed)
Patient had bowel movement that was liquid and clear in appearance. No stool was present in liquid.

## 2018-06-12 NOTE — Treatment Plan (Signed)
Discussed CT findings with Radiology. Patient noted to have a 15cm peritoneal abscess on study. Discussed with General Surgery who is recommending consulting IR for possible drain placement, also states Surgery will continue to follow. Have placed IR consult. Continuing on cefepime.

## 2018-06-12 NOTE — Progress Notes (Signed)
PROGRESS NOTE    Vincent Mcclure  QIO:[id number] DOB: 11/22/1931 DOA: 06/02/2018 PCP: [name], DO    Brief Narrative:  84 year old male with medical history significant for CAD, colon adenocarcinoma, history of alcohol abuse, A. fib, hypertension, status post ex lap and right colectomy with primary anastomosis on 8/9 due to adenocarcinoma.  Patient presented to the surgical clinic postop eval where he was found to be weak, short of breath and congested, subsequently sent to the emergency department where he was evaluated by EDP and surgical team and was admitted with working diagnosis of suspected CHF and pneumoperitoneum.  Assessment & Plan:   Active Problems:   HLD (hyperlipidemia)   HTN (hypertension)   GERD   h/o Anemia due to blood loss, acute   Alcohol use/ h/o heavy abuse   s/p NSTEMI (non-ST elevated myocardial infarction); with subsequent urgent CABG  5/18 ([place])   PAF (paroxysmal atrial fibrillation) ([place])   Cancer of ascending colon pT4apN1 s/p right colectomy 05/19/2018   Congestive heart failure (CHF) ([name])   Hypokalemia   Atrial fibrillation with RVR ([name])   Hypoalbuminemia   Subacute confusional state   CHF (congestive heart failure) ([name])  Acute respiratory failure with hypoxia due to CHF exacerbation, HCAP with sepsis not present on admission -Presently remains on 3LNC -Continued on scheduled lasix. Edema has improved since presentation -Patient recently noted to have fevers, increased sob, mildly elevated WBC, increased secretions worrisome for PNA with sepsis. -Continued on vancomycin and cefepime to cover HCAP. MRSA swab neg thus vanc discontinued -WBC had been trending down. Had been showing clinical improvement -This AM, patient with increased abd distension. Continued O2 dependence.  -Continuing to wean O2 as tolerated -will recheck bmet and cbc in AM -Given continued O2 dependence, have ordered CT chest and abd/pelvis. Ordered, pending  Acute on  chronic diastolic CHF -2d echo with findings of EF of 55%, with mild pulmonary hypertension and grade 1 diastolic dysfunction.   -Improvement noted with lasix initially noted with IV lasix -Will hold further lasix as pt is planned for contrast CT studies  Chronic A. fib -Rate controlled, chads Vasc score 5 noted.   -Patient currently not on anticoagulation prior to admission due to GI bleed  -Case discussed with General Surgery who did clear patient for anticoagulation, however in presence of pharmacist, nursing, patient has strongly declined anticoagulation and verbalized understanding of risk of stroke in the future. -Will continue patient on ASA for secondary stroke prevention instead -Will continue with current beta blocker regimen as tolerated -HR improved with increased beta blocker dose of 75mg  bid  Pneumoperitoneum -Felt to be related to recent colectomy on 05/19/2018 for adenoCA -Again noted on CXR on 8/30 -General Surgery had been following with no new recs -This AM, pt with abd distesion and "clear fluid" per rectum -CT abd/pelvis ordered  Alcohol abuse -No evidence of ETOH withdrawals thus far -Will recommend ETOH avoidance when discharged  Hypokalemia -Patient is continued on scheduled K. Dur 40 mEq daily due to persistent hypokalemia and patient on Lasix.   -Labs reviewed. K stable -Recheck bmet in AM  DVT prophylaxis: Lovenox subq Code Status: Full Family Communication: Pt in room, family is at bedside Disposition Plan: Plan for SNF, when fully diuresed  Consultants:   General Surgery  Procedures:     Antimicrobials: Anti-infectives (From admission, onward)   Start     Dose/Rate Route Frequency Ordered Stop   06/10/18 1200  vancomycin (VANCOCIN) 1,500 mg in sodium chloride 0.9 %  500 mL IVPB  Status:  Discontinued     1,500 mg 250 mL/hr over 120 Minutes Intravenous Every 24 hours 06/09/18 1055 06/12/18 0819   06/09/18 1200  ceFEPIme (MAXIPIME) 1 g in  sodium chloride 0.9 % 100 mL IVPB     1 g 200 mL/hr over 30 Minutes Intravenous Every 8 hours 06/09/18 1023     06/09/18 1100  vancomycin (VANCOCIN) 2,000 mg in sodium chloride 0.9 % 500 mL IVPB     2,000 mg 250 mL/hr over 120 Minutes Intravenous  Once 06/09/18 1023 06/09/18 1349      Subjective: More drowsy, difficult to assess  Objective: Vitals:   06/12/18 0456 06/12/18 0749 06/12/18 0809 06/12/18 0900  BP: (!) 149/76  138/74   Pulse: 98  (!) 110   Resp:      Temp:      TempSrc:      SpO2: 92% 93%  94%  Weight:      Height:        Intake/Output Summary (Last 24 hours) at 06/12/2018 1314 Last data filed at 06/12/2018 0941 Gross per 24 hour  Intake 1382.52 ml  Output 500 ml  Net 882.52 ml   Filed Weights   06/09/18 0441 06/11/18 0500 06/12/18 0432  Weight: 95.7 kg 96.2 kg 98.2 kg    Examination: General exam: asleep, arousable, laying in bed, in nad Respiratory system: Increased resp effort, coarse, no wheezing Cardiovascular system: regular rate, s1, s2 Gastrointestinal system: distended, pos bs Central nervous system: CN2-12 grossly intact, strength intact Extremities: Perfused, no clubbing Skin: Normal skin turgor, no notable skin lesions seen Psychiatry: Mood normal // no visual hallucinations    Data Reviewed: I have personally reviewed following labs and imaging studies  CBC: Recent Labs  Lab 06/07/18 0416 06/08/18 0421 06/09/18 0420 06/10/18 0351 06/11/18 0504  WBC 8.3 8.4 11.4* 11.0* 10.7*  HGB 10.1* 10.4* 10.8* 10.1* 10.2*  HCT 32.7* 33.9* 35.1* 32.7* 34.0*  MCV 90.3 91.1 90.7 90.3 92.6  PLT 376 365 398 341 793   Basic Metabolic Panel: Recent Labs  Lab 06/06/18 0500 06/07/18 0416 06/08/18 0421 06/09/18 0420 06/10/18 0351 06/11/18 0504 06/12/18 0436  NA 140 143 141 140 139 141 141  K 3.6 3.1* 3.7 3.9 4.2 4.5 4.3  CL 100 100 98 95* 96* 100 103  CO2 33* 36* 37* 35* 32 31 32  GLUCOSE 149* 117* 123* 155* 117* 110* 105*  BUN 13 12 14 15  18 19 19   CREATININE 0.94 0.89 1.00 1.03 0.93 0.97 1.02  CALCIUM 8.4* 8.4* 8.7* 8.5* 8.3* 8.6* 8.2*  MG 2.1 1.9 2.0 1.8  --   --   --    GFR: Estimated Creatinine Clearance: 64.4 mL/min (by C-G formula based on SCr of 1.02 mg/dL). Liver Function Tests: No results for input(s): AST, ALT, ALKPHOS, BILITOT, PROT, ALBUMIN in the last 168 hours. No results for input(s): LIPASE, AMYLASE in the last 168 hours. No results for input(s): AMMONIA in the last 168 hours. Coagulation Profile: No results for input(s): INR, PROTIME in the last 168 hours. Cardiac Enzymes: No results for input(s): CKTOTAL, CKMB, CKMBINDEX, TROPONINI in the last 168 hours. BNP (last 3 results) No results for input(s): PROBNP in the last 8760 hours. HbA1C: No results for input(s): HGBA1C in the last 72 hours. CBG: No results for input(s): GLUCAP in the last 168 hours. Lipid Profile: No results for input(s): CHOL, HDL, LDLCALC, TRIG, CHOLHDL, LDLDIRECT in the last 72 hours. Thyroid Function  Tests: No results for input(s): TSH, T4TOTAL, FREET4, T3FREE, THYROIDAB in the last 72 hours. Anemia Panel: No results for input(s): VITAMINB12, FOLATE, FERRITIN, TIBC, IRON, RETICCTPCT in the last 72 hours. Sepsis Labs: Recent Labs  Lab 06/09/18 1101  PROCALCITON 0.21    Recent Results (from the past 240 hour(s))  Culture, blood (routine x 2)     Status: None   Collection Time: 06/02/18  6:01 PM  Result Value Ref Range Status   Specimen Description   Final    BLOOD RIGHT ANTECUBITAL Performed at [facility] [address] [place], [place] [address]    Special Requests   Final    BOTTLES DRAWN AEROBIC AND ANAEROBIC Blood Culture adequate volume Performed at [place] [address] [place], [place] [address]    Culture   Final    NO GROWTH 5 DAYS Performed at [facility], [place] [address], [place], [place] [address]    Report Status 06/07/2018 FINAL  Final  Culture,  blood (routine x 2)     Status: None   Collection Time: 06/02/18  6:06 PM  Result Value Ref Range Status   Specimen Description   Final    BLOOD LEFT ANTECUBITAL Performed at [facility] [address] [place], [place] [address]    Special Requests   Final    BOTTLES DRAWN AEROBIC AND ANAEROBIC Blood Culture adequate volume Performed at [place] [address], [place], [place] [address]    Culture   Final    NO GROWTH 5 DAYS Performed at [facility], [place] [address], [place], [place] [address]    Report Status 06/07/2018 FINAL  Final  Urine culture     Status: None   Collection Time: 06/03/18 10:05 AM  Result Value Ref Range Status   Specimen Description   Final    URINE, CLEAN CATCH Performed at [facility], [place] [address], [place], [place] [address]    Special Requests   Final    NONE Performed at [facility], [place] [address] [place], [place] [address]    Culture   Final    NO GROWTH Performed at [facility], [place] [address] [place], [place] [address]    Report Status 06/04/2018 FINAL  Final  Culture, blood (x 2)     Status: None (Preliminary result)   Collection Time: 06/09/18 11:01 AM  Result Value Ref Range Status   Specimen Description   Final    BLOOD RIGHT ANTECUBITAL Performed at [place] [address] [place], [place] [address]    Special Requests   Final    BOTTLES DRAWN AEROBIC AND ANAEROBIC Blood Culture adequate volume Performed at [place] [address], [place], [place] [address]    Culture   Final    NO GROWTH 3 DAYS Performed at [facility], [place] [address], [place], [place] [address]    Report Status PENDING  Incomplete  Culture, blood (x 2)     Status: None (Preliminary result)   Collection Time: 06/09/18 11:06 AM  Result Value Ref Range Status   Specimen Description   Final    BLOOD RIGHT  FOREARM Performed at [place] [address] [place],  [address]    Special Requests   Final    BOTTLES DRAWN AEROBIC AND ANAEROBIC Blood Culture adequate volume Performed at [facility], [place] [address] [place], [place] [address]    Culture   Final    NO GROWTH 3 DAYS Performed at [facility], [place] [address], [place], [place] [address]    Report Status PENDING  Incomplete  MRSA PCR Screening     Status: None   Collection Time: 06/11/18  1:48 PM  Result Value Ref Range Status   MRSA by PCR NEGATIVE NEGATIVE Final    Comment:        The GeneXpert MRSA Assay (FDA approved for NASAL specimens only), is one component of a comprehensive MRSA colonization surveillance program. It is not intended to diagnose MRSA infection nor to guide or monitor treatment for MRSA infections. Performed at [facility], [place] [address] [place], [place] [address]      Radiology Studies: No results found.  Scheduled Meds: . aspirin EC  81 mg Oral Daily  . dextromethorphan-guaiFENesin  1 tablet Oral BID  . enoxaparin (LOVENOX) injection  40 mg Subcutaneous Q24H  . feeding supplement (ENSURE ENLIVE)  237 mL Oral Q24H  . furosemide  40 mg Intravenous Daily  . gabapentin  100 mg Oral BID  . iopamidol      . metoprolol tartrate  75 mg Oral BID  . multivitamin with minerals  1 tablet Oral Daily  . pantoprazole  40 mg Oral Daily  . polyethylene glycol  17 g Oral Daily  . potassium chloride  40 mEq Oral BID  . pravastatin  40 mg Oral QHS   Continuous Infusions: . sodium chloride 10 mL/hr at 06/12/18 0449  . ceFEPime (MAXIPIME) IV 1 g (06/12/18 1202)     LOS: 7 days   Marylu Lund, MD [facility] Pager [phone]  If 7PM-7AM, please contact night-coverage [email or web address] Password [email or web address] 06/12/2018, 1:14 PM

## 2018-06-12 NOTE — Progress Notes (Signed)
Pharmacy Antibiotic Note  Vincent Mcclure is a 84 y.o. male admitted on 06/02/2018 with pneumonia.  Pharmacy has been consulted for Vanc/Cefepime dosing for new onset HAP, fever overnight, rising WBC  Plan: Day 4 Abxs 1) Continue vanc 1500mg  IV q24 for now - goal AUC 400-500; however, would recommend to discontinue vanc due to negative MRSA PCR 2) Continue cefepime 1g IV q8 3) Monitor cultures, renal functions, fever  Height: 5\' 11"  (180.3 cm) Weight: 216 lb 7.9 oz (98.2 kg) IBW/kg (Calculated) : 75.3  Temp (24hrs), Avg:99.4 F (37.4 C), Min:98.7 F (37.1 C), Max:99.7 F (37.6 C)  Recent Labs  Lab 06/07/18 0416 06/08/18 0421 06/09/18 0420 06/10/18 0351 06/11/18 0504 06/12/18 0436  WBC 8.3 8.4 11.4* 11.0* 10.7*  --   CREATININE 0.89 1.00 1.03 0.93 0.97 1.02    Estimated Creatinine Clearance: 64.4 mL/min (by C-G formula based on SCr of 1.02 mg/dL).    Allergies  Allergen Reactions  . No Known Allergies     Antimicrobials this admission: 8/30 vanc >> 8/30 cefepime >>  Microbiology results: 8/30 BCx: ngtd 8/30 UCx: ngf   Thank you for allowing pharmacy to be a part of this patient's care.  Kara Mead 06/12/2018 8:12 AM

## 2018-06-13 ENCOUNTER — Other Ambulatory Visit: Payer: Self-pay | Admitting: *Deleted

## 2018-06-13 ENCOUNTER — Inpatient Hospital Stay (HOSPITAL_COMMUNITY): Payer: Medicare HMO

## 2018-06-13 ENCOUNTER — Encounter (HOSPITAL_COMMUNITY): Payer: Self-pay

## 2018-06-13 LAB — COMPREHENSIVE METABOLIC PANEL
ALT: 33 U/L (ref 0–44)
ANION GAP: 9 (ref 5–15)
AST: 29 U/L (ref 15–41)
Albumin: 2.2 g/dL — ABNORMAL LOW (ref 3.5–5.0)
Alkaline Phosphatase: 90 U/L (ref 38–126)
BILIRUBIN TOTAL: 0.7 mg/dL (ref 0.3–1.2)
BUN: 19 mg/dL (ref 8–23)
CALCIUM: 8.9 mg/dL (ref 8.9–10.3)
CO2: 30 mmol/L (ref 22–32)
Chloride: 101 mmol/L (ref 98–111)
Creatinine, Ser: 1.06 mg/dL (ref 0.61–1.24)
GFR calc non Af Amer: >60 mL/min (ref >60)
GLUCOSE: 119 mg/dL — AB (ref 70–99)
POTASSIUM: 5.1 mmol/L (ref 3.5–5.1)
Sodium: 140 mmol/L (ref 135–145)
TOTAL PROTEIN: 5.3 g/dL — AB (ref 6.5–8.1)

## 2018-06-13 LAB — CBC
HEMATOCRIT: 34.8 % — AB (ref 39.0–52.0)
Hemoglobin: 10.5 g/dL — ABNORMAL LOW (ref 13.0–17.0)
MCH: 27.9 pg (ref 26.0–34.0)
MCHC: 30.2 g/dL (ref 30.0–36.0)
MCV: 92.3 fL (ref 78.0–100.0)
Platelets: 302 10*3/uL (ref 150–400)
RBC: 3.77 MIL/uL — ABNORMAL LOW (ref 4.22–5.81)
RDW: 14.7 % (ref 11.5–15.5)
WBC: 11.6 10*3/uL — ABNORMAL HIGH (ref 4.0–10.5)

## 2018-06-13 LAB — APTT: APTT: 37 s — AB (ref 24–36)

## 2018-06-13 LAB — PROTIME-INR
INR: 1.2
PROTHROMBIN TIME: 15.1 s (ref 11.4–15.2)

## 2018-06-13 MED ORDER — SODIUM CHLORIDE 0.9 % IV SOLN
INTRAVENOUS | Status: DC
  Administered 2018-06-13: 10 mL/h via INTRAVENOUS

## 2018-06-13 MED ORDER — MIDAZOLAM HCL 2 MG/2ML IJ SOLN
INTRAMUSCULAR | Status: AC | PRN
  Administered 2018-06-13: 1 mg via INTRAVENOUS

## 2018-06-13 MED ORDER — LIDOCAINE HCL (PF) 1 % IJ SOLN
INTRAMUSCULAR | Status: AC | PRN
  Administered 2018-06-13: 10 mL

## 2018-06-13 MED ORDER — FENTANYL CITRATE (PF) 100 MCG/2ML IJ SOLN
INTRAMUSCULAR | Status: AC | PRN
  Administered 2018-06-13: 50 ug via INTRAVENOUS

## 2018-06-13 MED ORDER — SODIUM CHLORIDE 0.9% FLUSH
5.0000 mL | Freq: Three times a day (TID) | INTRAVENOUS | Status: DC
  Administered 2018-06-13 – 2018-06-22 (×28): 5 mL

## 2018-06-13 MED ORDER — FENTANYL CITRATE (PF) 100 MCG/2ML IJ SOLN
INTRAMUSCULAR | Status: AC
  Filled 2018-06-13: qty 2

## 2018-06-13 MED ORDER — MIDAZOLAM HCL 2 MG/2ML IJ SOLN
INTRAMUSCULAR | Status: AC
  Filled 2018-06-13: qty 4

## 2018-06-13 NOTE — Progress Notes (Signed)
PROGRESS NOTE    Vincent Mcclure  RSW:[id number] DOB: 1932-01-13 DOA: 06/02/2018 PCP: [name], DO    Brief Narrative:  84 year old male with medical history significant for CAD, colon adenocarcinoma, history of alcohol abuse, A. fib, hypertension, status post ex lap and right colectomy with primary anastomosis on 8/9 due to adenocarcinoma.  Patient presented to the surgical clinic postop eval where he was found to be weak, short of breath and congested, subsequently sent to the emergency department where he was evaluated by EDP and surgical team and was admitted with working diagnosis of suspected CHF and pneumoperitoneum.  Assessment & Plan:   Active Problems:   HLD (hyperlipidemia)   HTN (hypertension)   GERD   h/o Anemia due to blood loss, acute   Alcohol use/ h/o heavy abuse   s/p NSTEMI (non-ST elevated myocardial infarction); with subsequent urgent CABG  5/18 ([place])   PAF (paroxysmal atrial fibrillation) ([place])   Cancer of ascending colon [id number] s/p right colectomy 05/19/2018   Congestive heart failure (CHF) ([name])   Hypokalemia   Atrial fibrillation with RVR ([name])   Hypoalbuminemia   Subacute confusional state   CHF (congestive heart failure) ([name])  Acute respiratory failure with hypoxia due to CHF exacerbation, HCAP with sepsis not present on admission -Presently remains on 3LNC -Continued on scheduled lasix. Edema has improved since presentation -Patient recently noted to have fevers, increased sob, mildly elevated WBC, increased secretions worrisome for PNA with sepsis. -Continued on vancomycin and cefepime to cover HCAP. MRSA swab neg thus vanc discontinued -Clinically appears more alert this AM. Continued on cefepime  Acute on chronic diastolic CHF -2d echo with findings of EF of 55%, with mild pulmonary hypertension and grade 1 diastolic dysfunction.   -Improvement noted with lasix initially noted with IV lasix -Continues to tolerate diuresis. Will  continue  Chronic A. fib -Rate controlled, chads Vasc score 5 noted.   -Patient currently not on anticoagulation prior to admission due to GI bleed  -Case discussed with General Surgery who did clear patient for anticoagulation, however in presence of pharmacist, nursing, patient has strongly declined anticoagulation and verbalized understanding of risk of stroke in the future. -Will continue patient on ASA for secondary stroke prevention instead -Will continue with current beta blocker regimen as tolerated. Vitals reviewed. HR currently rate controlled  Pneumoperitoneum -Felt to be related to recent colectomy on 05/19/2018 for adenoCA -Again noted on CXR on 8/30 -General Surgery had been following -Patient now with peritoneal abscess noted on CT, see below.   Alcohol abuse -No evidence of ETOH withdrawals thus far -Strongly recommend ETOH avoidance when discharged  Hypokalemia -Resolved -Potassium now 5.1 -Will hold scheduled potassium -Repeat bmet in AM. Continue to replace as needed  Peritoneal abscess -Large loculated fluid collection in R anterior pelvis extending along L pericolic gutter worrisome for peritoneal abscess -Consulted IR -Plan for drain placement today -Will continue cefepime  DVT prophylaxis: Lovenox subq Code Status: Full Family Communication: Pt in room, family is at bedside Disposition Plan: Plan for SNF, when fully diuresed  Consultants:   General Surgery  IR  Procedures:     Antimicrobials: Anti-infectives (From admission, onward)   Start     Dose/Rate Route Frequency Ordered Stop   06/10/18 1200  vancomycin (VANCOCIN) 1,500 mg in sodium chloride 0.9 % 500 mL IVPB  Status:  Discontinued     1,500 mg 250 mL/hr over 120 Minutes Intravenous Every 24 hours 06/09/18 1055 06/12/18 0819   06/09/18 1200  ceFEPIme (  MAXIPIME) 1 g in sodium chloride 0.9 % 100 mL IVPB     1 g 200 mL/hr over 30 Minutes Intravenous Every 8 hours 06/09/18 1023      06/09/18 1100  vancomycin (VANCOCIN) 2,000 mg in sodium chloride 0.9 % 500 mL IVPB     2,000 mg 250 mL/hr over 120 Minutes Intravenous  Once 06/09/18 1023 06/09/18 1349      Subjective: Without concerns this AM  Objective: Vitals:   06/12/18 1629 06/12/18 2113 06/13/18 0400 06/13/18 0519  BP: 125/64 125/73  110/69  Pulse: 90 95  75  Resp: 18 18  20   Temp: 98.9 F (37.2 C) 98.6 F (37 C)  97.6 F (36.4 C)  TempSrc: Oral Oral  Oral  SpO2: 96% 91%  98%  Weight:   101.1 kg   Height:        Intake/Output Summary (Last 24 hours) at 06/13/2018 1216 Last data filed at 06/13/2018 1100 Gross per 24 hour  Intake 578.23 ml  Output 1350 ml  Net -771.77 ml   Filed Weights   06/11/18 0500 06/12/18 0432 06/13/18 0400  Weight: 96.2 kg 98.2 kg 101.1 kg    Examination: General exam: Conversant, in no acute distress Respiratory system: normal chest rise, clear, no audible wheezing Cardiovascular system: regular rhythm, s1-s2 Gastrointestinal system: obese, nontender, pos BS Central nervous system: No seizures, no tremors Extremities: No cyanosis, no joint deformities Skin: No rashes, no pallor Psychiatry: Affect normal // no auditory hallucinations   Data Reviewed: I have personally reviewed following labs and imaging studies  CBC: Recent Labs  Lab 06/08/18 0421 06/09/18 0420 06/10/18 0351 06/11/18 0504 06/13/18 0438  WBC 8.4 11.4* 11.0* 10.7* 11.6*  HGB 10.4* 10.8* 10.1* 10.2* 10.5*  HCT 33.9* 35.1* 32.7* 34.0* 34.8*  MCV 91.1 90.7 90.3 92.6 92.3  PLT 365 398 341 332 458   Basic Metabolic Panel: Recent Labs  Lab 06/07/18 0416 06/08/18 0421 06/09/18 0420 06/10/18 0351 06/11/18 0504 06/12/18 0436 06/13/18 0438  NA 143 141 140 139 141 141 140  K 3.1* 3.7 3.9 4.2 4.5 4.3 5.1  CL 100 98 95* 96* 100 103 101  CO2 36* 37* 35* 32 31 32 30  GLUCOSE 117* 123* 155* 117* 110* 105* 119*  BUN 12 14 15 18 19 19 19   CREATININE 0.89 1.00 1.03 0.93 0.97 1.02 1.06  CALCIUM 8.4*  8.7* 8.5* 8.3* 8.6* 8.2* 8.9  MG 1.9 2.0 1.8  --   --   --   --    GFR: Estimated Creatinine Clearance: 62.8 mL/min (by C-G formula based on SCr of 1.06 mg/dL). Liver Function Tests: Recent Labs  Lab 06/13/18 0438  AST 29  ALT 33  ALKPHOS 90  BILITOT 0.7  PROT 5.3*  ALBUMIN 2.2*   No results for input(s): LIPASE, AMYLASE in the last 168 hours. No results for input(s): AMMONIA in the last 168 hours. Coagulation Profile: Recent Labs  Lab 06/13/18 0957  INR 1.20   Cardiac Enzymes: No results for input(s): CKTOTAL, CKMB, CKMBINDEX, TROPONINI in the last 168 hours. BNP (last 3 results) No results for input(s): PROBNP in the last 8760 hours. HbA1C: No results for input(s): HGBA1C in the last 72 hours. CBG: No results for input(s): GLUCAP in the last 168 hours. Lipid Profile: No results for input(s): CHOL, HDL, LDLCALC, TRIG, CHOLHDL, LDLDIRECT in the last 72 hours. Thyroid Function Tests: No results for input(s): TSH, T4TOTAL, FREET4, T3FREE, THYROIDAB in the last 72 hours. Anemia Panel:  No results for input(s): VITAMINB12, FOLATE, FERRITIN, TIBC, IRON, RETICCTPCT in the last 72 hours. Sepsis Labs: Recent Labs  Lab 06/09/18 1101  PROCALCITON 0.21    Recent Results (from the past 240 hour(s))  Culture, blood (x 2)     Status: None (Preliminary result)   Collection Time: 06/09/18 11:01 AM  Result Value Ref Range Status   Specimen Description   Final    BLOOD RIGHT ANTECUBITAL Performed at [facility] [address] [place], [name] [address]    Special Requests   Final    BOTTLES DRAWN AEROBIC AND ANAEROBIC Blood Culture adequate volume Performed at [facility] [address], [place], [place] [address]    Culture   Final    NO GROWTH 3 DAYS Performed at [facility], [place] [address] [place], [place] [address]    Report Status PENDING  Incomplete  Culture, blood (x 2)     Status: None (Preliminary result)    Collection Time: 06/09/18 11:06 AM  Result Value Ref Range Status   Specimen Description   Final    BLOOD RIGHT FOREARM Performed at [facility] [address], [place], [place] [address]    Special Requests   Final    BOTTLES DRAWN AEROBIC AND ANAEROBIC Blood Culture adequate volume Performed at [facility] [address], [place], [place] [address]    Culture   Final    NO GROWTH 3 DAYS Performed at [facility], [place] [address] [place], [place] [address]    Report Status PENDING  Incomplete  MRSA PCR Screening     Status: None   Collection Time: 06/11/18  1:48 PM  Result Value Ref Range Status   MRSA by PCR NEGATIVE NEGATIVE Final    Comment:        The GeneXpert MRSA Assay (FDA approved for NASAL specimens only), is one component of a comprehensive MRSA colonization surveillance program. It is not intended to diagnose MRSA infection nor to guide or monitor treatment for MRSA infections. Performed at [facility], [place] [address] [place], [place] [address]      Radiology Studies: Ct Chest W Contrast  Result Date: 06/12/2018 CLINICAL DATA:  RIGHT colectomy 05/19/2018 for colon adenocarcinoma. Suspicion pneumoperitoneum 1 chest radiograph EXAM: CT CHEST, ABDOMEN, AND PELVIS WITH CONTRAST TECHNIQUE: Multidetector CT imaging of the chest, abdomen and pelvis was performed following the standard protocol during bolus administration of intravenous contrast. CONTRAST:  148mL OMNIPAQUE IOHEXOL 300 MG/ML  SOLN COMPARISON:  Radiograph 06/09/2018, CT 05/18/2018 FINDINGS: CT CHEST FINDINGS Cardiovascular: Post CABG anatomy. No large pulmonary emboli identified. No pericardial effusion. Mediastinum/Nodes: No axillary supraclavicular adenopathy. No mediastinal hilar adenopathy. Esophagus normal. Lungs/Pleura: Moderate sized RIGHT pleural effusion with basilar atelectasis. Mild LEFT basilar atelectasis. No pneumonia. No  pneumothorax. No pulmonary edema. Musculoskeletal: Degenerate spurring of the spine. Midline sternotomy. CT ABDOMEN AND PELVIS FINDINGS Hepatobiliary: Extensive extraperitoneal free air position anterior to the liver and lateral to the liver. No focal hepatic lesion. Gallbladder collapsed. Portal veins patent. Pancreas: Pancreas is normal. No ductal dilatation. No pancreatic inflammation. Spleen: Normal spleen Adrenals/urinary tract: Stable nodule of the LEFT adrenal gland measures 2.2 cm. Previously characterized as adenoma. Stomach/Bowel: Stomach, duodenum, small-bowel appear normal. RIGHT hemicolectomy anatomy. No obstruction at the anastomosis. Oral contrast passes through the small bowel into the transverse colon through the anastomosis without gross evidence of leak. There is however large volume intraperitoneal free air in  the RIGHT upper quadrant. This suggest perforated bowel or anastomotic breakdown. The transverse and descending colon are normal. Rectum appears normal. Within the central lower RIGHT abdomen large fluid collection measuring 14.5 by 11.6 cm. This collection is homogeneous low simple fluid attenuation cyst in centrally with a thin enhancing rim consistent with peritoneal abscess. (Image 101/2). The collection has a thin arm which extends to the LEFT pericolic gutter. No significant free fluid in the pelvis. Small amount free fluid anterior to the RIGHT hepatic lobe also with a peritoneal enhancement (image 72/2). Vascular/Lymphatic: Abdominal aorta is normal caliber with atherosclerotic calcification. There is no retroperitoneal or periportal lymphadenopathy. No pelvic lymphadenopathy. Reproductive: Prostate normal Other: Large intraperitoneal free air in the RIGHT upper quadrant described in the stomach bowel section. Large peritoneal abscess as described in the bowel section. Musculoskeletal: No aggressive osseous lesion. IMPRESSION: 1. Evidence all bowel perforation/anastomosed breakdown  with large volume intraperitoneal free air fluid in the RIGHT upper quadrant adjacent and above the liver. 2. Large loculated fluid collection in the RIGHT anterior pelvis extending along the LEFT pericolic gutter consists with PERITONEAL ABSCESS. 3. Of note, no large volume intraperitoneal free fluid. Oral contrast passed through the RIGHT colon anastomosis without evidence of leak. Findings could indicate a prior anastomotic breakdown which has healed in the interval with subsequent abscess formation from the prior leak / breakdown. Critical Value/emergent results were called by telephone at the time of interpretation on 06/12/2018 at 3:12 pm to Dr. Marylu Lund , who verbally acknowledged these results. Electronically Signed   By: Suzy Bouchard M.D.   On: 06/12/2018 15:12   Ct Abdomen Pelvis W Contrast  Result Date: 06/12/2018 CLINICAL DATA:  RIGHT colectomy 05/19/2018 for colon adenocarcinoma. Suspicion pneumoperitoneum 1 chest radiograph EXAM: CT CHEST, ABDOMEN, AND PELVIS WITH CONTRAST TECHNIQUE: Multidetector CT imaging of the chest, abdomen and pelvis was performed following the standard protocol during bolus administration of intravenous contrast. CONTRAST:  159mL OMNIPAQUE IOHEXOL 300 MG/ML  SOLN COMPARISON:  Radiograph 06/09/2018, CT 05/18/2018 FINDINGS: CT CHEST FINDINGS Cardiovascular: Post CABG anatomy. No large pulmonary emboli identified. No pericardial effusion. Mediastinum/Nodes: No axillary supraclavicular adenopathy. No mediastinal hilar adenopathy. Esophagus normal. Lungs/Pleura: Moderate sized RIGHT pleural effusion with basilar atelectasis. Mild LEFT basilar atelectasis. No pneumonia. No pneumothorax. No pulmonary edema. Musculoskeletal: Degenerate spurring of the spine. Midline sternotomy. CT ABDOMEN AND PELVIS FINDINGS Hepatobiliary: Extensive extraperitoneal free air position anterior to the liver and lateral to the liver. No focal hepatic lesion. Gallbladder collapsed. Portal veins  patent. Pancreas: Pancreas is normal. No ductal dilatation. No pancreatic inflammation. Spleen: Normal spleen Adrenals/urinary tract: Stable nodule of the LEFT adrenal gland measures 2.2 cm. Previously characterized as adenoma. Stomach/Bowel: Stomach, duodenum, small-bowel appear normal. RIGHT hemicolectomy anatomy. No obstruction at the anastomosis. Oral contrast passes through the small bowel into the transverse colon through the anastomosis without gross evidence of leak. There is however large volume intraperitoneal free air in the RIGHT upper quadrant. This suggest perforated bowel or anastomotic breakdown. The transverse and descending colon are normal. Rectum appears normal. Within the central lower RIGHT abdomen large fluid collection measuring 14.5 by 11.6 cm. This collection is homogeneous low simple fluid attenuation cyst in centrally with a thin enhancing rim consistent with peritoneal abscess. (Image 101/2). The collection has a thin arm which extends to the LEFT pericolic gutter. No significant free fluid in the pelvis. Small amount free fluid anterior to the RIGHT hepatic lobe also with a peritoneal enhancement (image 72/2). Vascular/Lymphatic: Abdominal aorta is  normal caliber with atherosclerotic calcification. There is no retroperitoneal or periportal lymphadenopathy. No pelvic lymphadenopathy. Reproductive: Prostate normal Other: Large intraperitoneal free air in the RIGHT upper quadrant described in the stomach bowel section. Large peritoneal abscess as described in the bowel section. Musculoskeletal: No aggressive osseous lesion. IMPRESSION: 1. Evidence all bowel perforation/anastomosed breakdown with large volume intraperitoneal free air fluid in the RIGHT upper quadrant adjacent and above the liver. 2. Large loculated fluid collection in the RIGHT anterior pelvis extending along the LEFT pericolic gutter consists with PERITONEAL ABSCESS. 3. Of note, no large volume intraperitoneal free fluid.  Oral contrast passed through the RIGHT colon anastomosis without evidence of leak. Findings could indicate a prior anastomotic breakdown which has healed in the interval with subsequent abscess formation from the prior leak / breakdown. Critical Value/emergent results were called by telephone at the time of interpretation on 06/12/2018 at 3:12 pm to Dr. Marylu Lund , who verbally acknowledged these results. Electronically Signed   By: Suzy Bouchard M.D.   On: 06/12/2018 15:12    Scheduled Meds: . aspirin EC  81 mg Oral Daily  . dextromethorphan-guaiFENesin  1 tablet Oral BID  . enoxaparin (LOVENOX) injection  40 mg Subcutaneous Q24H  . feeding supplement (ENSURE ENLIVE)  237 mL Oral Q24H  . furosemide  40 mg Intravenous Daily  . gabapentin  100 mg Oral BID  . metoprolol tartrate  75 mg Oral BID  . multivitamin with minerals  1 tablet Oral Daily  . pantoprazole  40 mg Oral Daily  . polyethylene glycol  17 g Oral Daily  . pravastatin  40 mg Oral QHS   Continuous Infusions: . sodium chloride 10 mL/hr at 06/12/18 0449  . sodium chloride    . ceFEPime (MAXIPIME) IV 1 g (06/13/18 1122)     LOS: 8 days   Marylu Lund, MD [facility] Pager [phone]  If 7PM-7AM, please contact night-coverage [email or web address] Password [email or web address] 06/13/2018, 12:16 PM

## 2018-06-13 NOTE — Progress Notes (Signed)
Physical Therapy Treatment Patient Details Name: Vincent Mcclure MRN: [id number] DOB: 1931/12/26 Today's Date: 06/13/2018    History of Present Illness 84 y.o. male with medical history significant of CAD s/p CABG in 2018, PAF not on anticoagulation due to h/o GIB in past, and s/p exp lap with colectomy 05/19/18.  He was admitted from his follow up surgical appt due to weakness and congestion.     PT Comments    Pt OOB in recliner on 3 lts sats 100% and HR 88.  Trial RA during rest and gait.  RA decreased to 81% with amb requiring 3 lts to achieve sats >90%.    SATURATION QUALIFICATIONS: (This note is used to comply with regulatory documentation for home oxygen)  Patient Saturations on Room Air at Rest = 94%  Patient Saturations on Room Air while Ambulating 42 feet = 81%  Patient Saturations on 3 Liters of oxygen while Ambulating = 91%  Please briefly explain why patient needs home oxygen:  Pt required supplemental oxygen during activity  Pt progressing slowly and will need ST Rehab prior to safely retuning home with spouse.   Follow Up Recommendations  SNF     Equipment Recommendations  None recommended by PT    Recommendations for Other Services       Precautions / Restrictions Precautions Precautions: Fall Precaution Comments: watch HR and sats Restrictions Weight Bearing Restrictions: No    Mobility  Bed Mobility               General bed mobility comments: OOB in recliner  Transfers Overall transfer level: Needs assistance Equipment used: None;Rolling walker (2 wheeled) Transfers: Sit to/from Stand Sit to Stand: Min assist Stand pivot transfers: From elevated surface       General transfer comment: assist to power up and to control descent. VCS safety, hand placement. Increased time.   Ambulation/Gait Ambulation/Gait assistance: Min assist Gait Distance (Feet): 42 Feet Assistive device: Rolling walker (2 wheeled) Gait Pattern/deviations:  Step-through pattern;Decreased stride length Gait velocity: decreased    General Gait Details: decreased amb distance due to increased c/o weakness (both legs) and dyspnea.  Trial RA decreased to 81% reapplied 3 lts to achieve 90%.  Progessing slowly.   Stairs             Wheelchair Mobility    Modified Rankin (Stroke Patients Only)       Balance                                            Cognition Arousal/Alertness: Awake/alert Behavior During Therapy: WFL for tasks assessed/performed Overall Cognitive Status: Within Functional Limits for tasks assessed                                 General Comments: pleasant      Exercises      General Comments        Pertinent Vitals/Pain Pain Assessment: Faces Faces Pain Scale: Hurts a little bit Pain Location: chest, Abd incision, R shoulder Pain Descriptors / Indicators: Tightness;Discomfort Pain Intervention(s): Monitored during session    Home Living                      Prior Function            PT Goals (  current goals can now be found in the care plan section) Progress towards PT goals: Progressing toward goals    Frequency    Min 3X/week      PT Plan Current plan remains appropriate    Co-evaluation              AM-PAC PT "6 Clicks" Daily Activity  Outcome Measure  Difficulty turning over in bed (including adjusting bedclothes, sheets and blankets)?: A Lot Difficulty moving from lying on back to sitting on the side of the bed? : Unable   Help needed moving to and from a bed to chair (including a wheelchair)?: A Little Help needed walking in hospital room?: A Little Help needed climbing 3-5 steps with a railing? : A Lot 6 Click Score: 11    End of Session Equipment Utilized During Treatment: Gait belt;Oxygen Activity Tolerance: Patient limited by fatigue Patient left: in chair;with call bell/phone within reach;with family/visitor  present Nurse Communication: Mobility status PT Visit Diagnosis: Unsteadiness on feet (R26.81);Muscle weakness (generalized) (M62.81);History of falling (Z91.81);Difficulty in walking, not elsewhere classified (R26.2)     Time: 7616-0737 PT Time Calculation (min) (ACUTE ONLY): 28 min  Charges:  $Gait Training: 8-22 mins $Therapeutic Activity: 8-22 mins                     Rica Koyanagi  PTA [facility]  Acute  Rehab Pager      [phone]

## 2018-06-13 NOTE — Progress Notes (Signed)
Assumed care of this patient from previous RN. Agree with prior assessment. Will continue to monitor patient closely.

## 2018-06-13 NOTE — Progress Notes (Signed)
OT Cancellation Note  Patient Details Name: FINES KIMBERLIN MRN: [id number] DOB: [birth year]   Cancelled Treatment:    Reason Eval/Treat Not Completed: Other (comment)  Noted plans for procedure this day. Will check on pt next day Kari Baars, [place]  Payton Mccallum D 06/13/2018, 11:27 AM

## 2018-06-13 NOTE — Patient Outreach (Signed)
[facility]) Care Management  06/13/2018  Vincent Mcclure 1932/03/24 [id number]   CSW was scheduled to make an initial outreach attempt to patient today to perform phone assessment, as well as assess and assist with social work needs and services; however, CSW noted that patient currently resides in the hospital.  Patient was admitted to [facility] on June 02, 2018 due to shortness of breath, weakness, pressure in the left ear, without drainage, and persistent cough.  Patient was referred to [facility] from [facility] where patient was being seen for Status Post Right Colectomy with Primary Anastomosis, which was performed on May 19, 2018 due to diagnosis of Colon Cancer.  Patient has a dehisced surgical wound that now requires wet-to-dry packing and dressing changes.  Upon discharge from the hospital, the plan is for patient to reside at [facility] in [place], [place], where patient will receive short-term rehabilitative services, prior to returning home to live with his wife, Vincent Mcclure.  CSW will continue to follow patient while in the hospital and then begin providing social work services upon discharge back into the community. Nat Christen, BSW, MSW, LCSW  Licensed Education officer, [employer]  Mailing [name], [name], [name] Physical Address-[address], [place], [place] [address] Toll Free Main # [phone] Fax # [phone] Cell # [phone]  Office # [phone] [email or web address]

## 2018-06-13 NOTE — Progress Notes (Signed)
MEDICATION-RELATED CONSULT NOTE   IR Procedure Consult - Anticoagulant/Antiplatelet PTA/Inpatient Med List Review by Pharmacist    Procedure: CT guided RLQ drain placement    Completed: 06/13/18 @ 17:06  Post-Procedural bleeding risk per IR MD assessment:  LOW  Antithrombotic medications on inpatient or PTA profile prior to procedure:   Enoxaparin 40mg  sq q24h (last dose 06/12/18 @ 21:39)    Recommended restart time per IR Post-Procedure Guidelines:    Day 0 (at least 4 hours or at next standard dose interval)    Plan:     Continue with Enoxaparin 40mg  sq q24h (next scheduled dose @ 22:00 tonight)  Leone Haven, PharmD 06/13/18 @ 17:21

## 2018-06-13 NOTE — Progress Notes (Signed)
CSW following to assist with disposition. Planning to have pt admit to [facility] for rehab once medically stable. Had approved insurance authorization on 06/08/18 however has expired- updated facility and SNF will initiate authorization again once pt approaching stability.  Sharren Bridge, MSW, LCSW Clinical Social Work 06/13/2018 [phone]

## 2018-06-13 NOTE — Procedures (Signed)
Interventional Radiology Procedure Note  Procedure: CT guided RLQ drain placement. 71F drain. ~450cc of murky yellow fluid aspirated.   Complications: None Recommendations:  - To gravity drain - Do not submerge - Routine drain care   Signed,  Dulcy Fanny. Earleen Newport, DO

## 2018-06-13 NOTE — Consult Note (Signed)
Chief Complaint: Patient was seen in consultation today for image guided percutaneous aspiration and drainage of peritoneal abscess  Referring Physician(s): Dr. Marylu Lund  Supervising Physician: Corrie Mckusick  Patient Status: [facility] - In-pt  History of Present Illness: Vincent Mcclure is a 84 y.o. male with a past medical history of ETOH abuse, anemia, GERD, HLD, HTN, a.fib and most recently colon cancer with right colectomy on 05/19/18. Patient was d/ced from hospital on 8/20 following colectomy, he presented to his outpatient follow up appointment with surgery on 8/23 and was found to be very weak and congested - he was sent to [facility] ED for evaluation. CXR in ED showed pneumoperitoneum which was thought to be related to recent surgery. He was admitted from the ED for treatment of acute respiratory failure due to CHF exacerbation. He subsequently developed HCAP with sepsis and IV antibiotics were started on 8/30.   CT abdomen and pelvis performed on 9/2 showed evidence of bowel perforation/anastomic breakdown with large volume intraperitoneal free air fluid in the right upper quadrant as well as a large loculated fluid collection in the right anterior pelvis extending along the left pericolic gutter consistent with peritoneal abscess. IR has been consulted for aspiration and drainage of peritoneal abscess.  Patient reports he feels alright but "I don't know though, I lost my days." He reports some dyspnea, increased secretion, abdominal pain at times and decreased appetite. He states he is "ready to get this thing going."   Past Medical History:  Diagnosis Date  . Alcohol abuse   . Anemia   . Chronic low back pain 10/14/2011  . Colon polyps    hyperplastic (2004, 2010) and adenomatous (1990).    . COLONIC POLYPS, HX OF 03/05/2008  . Esophageal stricture    hx of  . Gastric ulcer   . GERD 03/05/2008  . GERD (gastroesophageal reflux disease) 1994   associated peptic strictures  .  HYPERLIPIDEMIA 03/05/2008  . HYPERTENSION 03/05/2008  . Hypertension   . IBS 03/05/2008  . Impaired glucose tolerance 10/12/2011  . Iron deficiency anemia   . LEG CRAMPS 09/02/2010  . PAD (peripheral artery disease) ([place]) 10/14/2011  . PAD (peripheral artery disease) ([place]) 2013  . PERIPHERAL EDEMA 09/02/2010  . PERIPHERAL NEUROPATHY 03/05/2008  . Prostatitis    hx of  . VARICOSE VEINS, LOWER EXTREMITIES 06/09/2009    Past Surgical History:  Procedure Laterality Date  . CATARACT EXTRACTION  bilat  . CORONARY ARTERY BYPASS GRAFT N/A 02/15/2017   Procedure: CORONARY ARTERY BYPASS GRAFTING times three  with left internal mammary harvest and endoscopic harvest of Right SVG. Grafts of LIMA to  LAD, SVG to Distal Circ, and to First Diag.;  Surgeon: Grace Isaac, MD;  Location: [place];  Service: Open Heart Surgery;  Laterality: N/A;  . ESOPHAGOGASTRODUODENOSCOPY N/A 11/14/2014   Procedure: ESOPHAGOGASTRODUODENOSCOPY (EGD);  Surgeon: Jerene Bears, MD;  Location: [facility] ENDOSCOPY;  Service: Endoscopy;  Laterality: N/A;  . LAPAROTOMY N/A 05/19/2018   Procedure: EXPLORATORY LAPAROTOMY RIGHT COLECTOMY;  Surgeon: Fanny Skates, MD;  Location: [facility] ORS;  Service: General;  Laterality: N/A;  . LEFT HEART CATH AND CORONARY ANGIOGRAPHY N/A 02/14/2017   Procedure: Left Heart Cath and Coronary Angiography;  Surgeon: Belva Crome, MD;  Location: [place] CV LAB;  Service: Cardiovascular;  Laterality: N/A;  . LUMBAR SPINE SURGERY  11/2008   Dr Joya Salm  . TEE WITHOUT CARDIOVERSION N/A 02/15/2017   Procedure: TRANSESOPHAGEAL ECHOCARDIOGRAM (TEE);  Surgeon: Grace Isaac,  MD;  Location: [facility] OR;  Service: Open Heart Surgery;  Laterality: N/A;    Allergies: No known allergies  Medications: Prior to Admission medications   Medication Sig Start Date End Date Taking? Authorizing Provider  acetaminophen (TYLENOL) 500 MG tablet Take 1 tablet (500 mg total) by mouth every 6 (six) hours as needed for mild pain. 05/30/18   Yes Rayburn, Floyce Stakes, PA-C  aspirin EC 81 MG EC tablet Take 1 tablet (81 mg total) by mouth daily. 02/25/17  Yes Conte, Tessa N, PA-C  furosemide (LASIX) 40 MG tablet Take 1 tablet (40 mg total) by mouth daily. 08/10/17 08/10/18 Yes Dorothy Spark, MD  gabapentin (NEURONTIN) 100 MG capsule Take 1 capsule (100 mg total) by mouth 2 (two) times daily. 05/30/18  Yes Rayburn, Claiborne Billings A, PA-C  metoprolol succinate (TOPROL XL) 50 MG 24 hr tablet Take 1 tablet (50 mg total) by mouth daily. Take with or immediately following a meal. 05/24/17  Yes Imogene Burn, PA-C  polyethylene glycol [facility]) packet Take 17 g by mouth daily. 05/30/18  Yes Rayburn, Claiborne Billings A, PA-C  pravastatin (PRAVACHOL) 40 MG tablet I po q hs Patient taking differently: Take 40 mg by mouth daily.  01/17/18  Yes Opalski, Deborah, DO  alum & mag hydroxide-simeth (MAALOX/MYLANTA) 200-200-20 MG/5ML suspension Take 30 mLs by mouth every 6 (six) hours as needed for indigestion (gas/bloating). Patient not taking: Reported on 06/02/2018 05/30/18   Rayburn, Claiborne Billings A, PA-C  ferrous sulfate 325 (65 FE) MG EC tablet Take 1 tablet (325 mg total) by mouth 3 (three) times daily with meals. Patient not taking: Reported on 06/02/2018 01/17/18   [name], DO  traMADol (ULTRAM) 50 MG tablet Take 1 tablet (50 mg total) by mouth every 6 (six) hours as needed for moderate pain or severe pain. Patient not taking: Reported on 06/02/2018 05/30/18   Rayburn, Floyce Stakes, PA-C     Family History  Problem Relation Age of Onset  . Cancer Mother        Brain Cancer  . Diabetes Father   . Heart disease Father        CAD  . Hypertension Father   . Stroke Father   . Diabetes Sister   . Stomach cancer Neg Hx   . Pancreatic cancer Neg Hx   . Colon cancer Neg Hx   . Esophageal cancer Neg Hx     Social History   Socioeconomic History  . Marital status: Married    Spouse name: Not on file  . Number of children: 4  . Years of education: Not on file  . Highest  education level: Not on file  Occupational History  . Occupation: Retired Biomedical scientist  . Financial resource strain: Not on file  . Food insecurity:    Worry: Not on file    Inability: Not on file  . Transportation needs:    Medical: Not on file    Non-medical: Not on file  Tobacco Use  . Smoking status: Former Smoker    Packs/day: 1.25    Years: 50.00    Pack years: 62.50    Types: Cigarettes    Last attempt to quit: 11/11/2008    Years since quitting: 9.5  . Smokeless tobacco: Former Systems developer    Types: Chew  Substance and Sexual Activity  . Alcohol use: Yes    Alcohol/week: 6.0 standard drinks    Types: 2 Glasses of wine, 4 Cans of beer per week  .  Drug use: No  . Sexual activity: Never  Lifestyle  . Physical activity:    Days per week: Not on file    Minutes per session: Not on file  . Stress: Not on file  Relationships  . Social connections:    Talks on phone: Not on file    Gets together: Not on file    Attends religious service: Not on file    Active member of club or organization: Not on file    Attends meetings of clubs or organizations: Not on file    Relationship status: Not on file  Other Topics Concern  . Not on file  Social History Narrative   ** Merged History Encounter **       4 sons     Review of Systems: A 12 point ROS discussed and pertinent positives are indicated in the HPI above.  All other systems are negative.  Review of Systems  Constitutional: Positive for appetite change. Negative for chills and fatigue.  HENT: Positive for congestion and hearing loss.   Respiratory: Positive for cough (intermittent) and shortness of breath.   Gastrointestinal: Positive for abdominal pain. Negative for diarrhea, nausea and vomiting.  Skin: Negative for rash.       Open abdominal wound  Neurological: Negative for syncope.  Psychiatric/Behavioral: Negative for confusion.    Vital Signs: BP 110/69 (BP Location: Left Arm)   Pulse 75    Temp 97.6 F (36.4 C) (Oral)   Resp 20   Ht 5\' 11"  (1.803 m)   Wt 222 lb 14.2 oz (101.1 kg)   SpO2 98%   BMI 31.09 kg/m   Physical Exam  Constitutional: He is oriented to person, place, and time. No distress.  HENT:  Head: Normocephalic.  Cardiovascular: Normal rate, regular rhythm and normal heart sounds.  Pulmonary/Chest: Effort normal.  Wet lung sounds RUL; 3L O2 via [place] in place  Abdominal: He exhibits distension. There is tenderness (mild, near incision).  3x3 cm open wound at midline surgical incision - packing/dressing changed by RN during exam.   Neurological: He is alert and oriented to person, place, and time.  Skin: Skin is warm and dry. No rash noted. He is not diaphoretic.  Psychiatric: He has a normal mood and affect. His behavior is normal. Judgment and thought content normal.  Nursing note and vitals reviewed.    MD Evaluation Airway: WNL Heart: WNL Abdomen: Other (comments) Abdomen comments: Open wound at midline surgical scar - packed and dressed Chest/ Lungs: WNL ASA  Classification: 3 Mallampati/Airway Score: One   Imaging: Dg Chest 2 View  Result Date: 06/02/2018 CLINICAL DATA:  84 y/o M; laparoscopic partial colectomy 05/19/2018. Shortness of breath and congestion. EXAM: CHEST - 2 VIEW COMPARISON:  05/22/2018 chest radiograph FINDINGS: Stable normal cardiac silhouette given projection and technique. Post median sternotomy with wires in alignment. Aortic atherosclerosis with calcification. Clear lungs. No pleural effusion or pneumothorax. Pneumoperitoneum. No acute osseous abnormality is evident. IMPRESSION: 1. Pneumoperitoneum. 2. No acute pulmonary process identified. These results were called by telephone at the time of interpretation on 06/02/2018 at 5:46 pm to Dr. Vallery Ridge, who verbally acknowledged these results. Electronically Signed   By: Kristine Garbe M.D.   On: 06/02/2018 17:48   Dg Abd 1 View  Result Date: 05/22/2018 CLINICAL DATA:   Status post NG tube placement today. EXAM: ABDOMEN - 1 VIEW COMPARISON:  Plain films of the abdomen earlier today. FINDINGS: NG tube is in place with both the tip  and side-port in the stomach. Diffuse gaseous distension of bowel is seen as on the prior exam. Surgical staples in the midline are noted. IMPRESSION: NG tube in good position. Mild diffuse gaseous distention of bowel likely due to ileus. Electronically Signed   By: Inge Rise M.D.   On: 05/22/2018 11:15   Dg Abd 1 View  Result Date: 05/22/2018 CLINICAL DATA:  Abdominal distention. Status post right colectomy for obstructing hepatic flexure lesion of the colon. EXAM: ABDOMEN - 1 VIEW COMPARISON:  Radiographs dated 05/19/2018 and CT scan dated 05/18/2018 FINDINGS: There is increased air throughout the bowel with an increased number of slightly distended small bowel loops. Given the patient's recent surgery, this probably represents an ileus. The colon is not distended. IMPRESSION: Increased air throughout the bowel with an increased number slightly dilated small bowel loops, most likely ileus. Electronically Signed   By: Lorriane Shire M.D.   On: 05/22/2018 08:30   Ct Chest W Contrast  Result Date: 06/12/2018 CLINICAL DATA:  RIGHT colectomy 05/19/2018 for colon adenocarcinoma. Suspicion pneumoperitoneum 1 chest radiograph EXAM: CT CHEST, ABDOMEN, AND PELVIS WITH CONTRAST TECHNIQUE: Multidetector CT imaging of the chest, abdomen and pelvis was performed following the standard protocol during bolus administration of intravenous contrast. CONTRAST:  129mL OMNIPAQUE IOHEXOL 300 MG/ML  SOLN COMPARISON:  Radiograph 06/09/2018, CT 05/18/2018 FINDINGS: CT CHEST FINDINGS Cardiovascular: Post CABG anatomy. No large pulmonary emboli identified. No pericardial effusion. Mediastinum/Nodes: No axillary supraclavicular adenopathy. No mediastinal hilar adenopathy. Esophagus normal. Lungs/Pleura: Moderate sized RIGHT pleural effusion with basilar atelectasis.  Mild LEFT basilar atelectasis. No pneumonia. No pneumothorax. No pulmonary edema. Musculoskeletal: Degenerate spurring of the spine. Midline sternotomy. CT ABDOMEN AND PELVIS FINDINGS Hepatobiliary: Extensive extraperitoneal free air position anterior to the liver and lateral to the liver. No focal hepatic lesion. Gallbladder collapsed. Portal veins patent. Pancreas: Pancreas is normal. No ductal dilatation. No pancreatic inflammation. Spleen: Normal spleen Adrenals/urinary tract: Stable nodule of the LEFT adrenal gland measures 2.2 cm. Previously characterized as adenoma. Stomach/Bowel: Stomach, duodenum, small-bowel appear normal. RIGHT hemicolectomy anatomy. No obstruction at the anastomosis. Oral contrast passes through the small bowel into the transverse colon through the anastomosis without gross evidence of leak. There is however large volume intraperitoneal free air in the RIGHT upper quadrant. This suggest perforated bowel or anastomotic breakdown. The transverse and descending colon are normal. Rectum appears normal. Within the central lower RIGHT abdomen large fluid collection measuring 14.5 by 11.6 cm. This collection is homogeneous low simple fluid attenuation cyst in centrally with a thin enhancing rim consistent with peritoneal abscess. (Image 101/2). The collection has a thin arm which extends to the LEFT pericolic gutter. No significant free fluid in the pelvis. Small amount free fluid anterior to the RIGHT hepatic lobe also with a peritoneal enhancement (image 72/2). Vascular/Lymphatic: Abdominal aorta is normal caliber with atherosclerotic calcification. There is no retroperitoneal or periportal lymphadenopathy. No pelvic lymphadenopathy. Reproductive: Prostate normal Other: Large intraperitoneal free air in the RIGHT upper quadrant described in the stomach bowel section. Large peritoneal abscess as described in the bowel section. Musculoskeletal: No aggressive osseous lesion. IMPRESSION: 1.  Evidence all bowel perforation/anastomosed breakdown with large volume intraperitoneal free air fluid in the RIGHT upper quadrant adjacent and above the liver. 2. Large loculated fluid collection in the RIGHT anterior pelvis extending along the LEFT pericolic gutter consists with PERITONEAL ABSCESS. 3. Of note, no large volume intraperitoneal free fluid. Oral contrast passed through the RIGHT colon anastomosis without evidence of leak. Findings could  indicate a prior anastomotic breakdown which has healed in the interval with subsequent abscess formation from the prior leak / breakdown. Critical Value/emergent results were called by telephone at the time of interpretation on 06/12/2018 at 3:12 pm to Dr. Marylu Lund , who verbally acknowledged these results. Electronically Signed   By: Suzy Bouchard M.D.   On: 06/12/2018 15:12   Ct Abdomen Pelvis W Contrast  Result Date: 06/12/2018 CLINICAL DATA:  RIGHT colectomy 05/19/2018 for colon adenocarcinoma. Suspicion pneumoperitoneum 1 chest radiograph EXAM: CT CHEST, ABDOMEN, AND PELVIS WITH CONTRAST TECHNIQUE: Multidetector CT imaging of the chest, abdomen and pelvis was performed following the standard protocol during bolus administration of intravenous contrast. CONTRAST:  143mL OMNIPAQUE IOHEXOL 300 MG/ML  SOLN COMPARISON:  Radiograph 06/09/2018, CT 05/18/2018 FINDINGS: CT CHEST FINDINGS Cardiovascular: Post CABG anatomy. No large pulmonary emboli identified. No pericardial effusion. Mediastinum/Nodes: No axillary supraclavicular adenopathy. No mediastinal hilar adenopathy. Esophagus normal. Lungs/Pleura: Moderate sized RIGHT pleural effusion with basilar atelectasis. Mild LEFT basilar atelectasis. No pneumonia. No pneumothorax. No pulmonary edema. Musculoskeletal: Degenerate spurring of the spine. Midline sternotomy. CT ABDOMEN AND PELVIS FINDINGS Hepatobiliary: Extensive extraperitoneal free air position anterior to the liver and lateral to the liver. No focal  hepatic lesion. Gallbladder collapsed. Portal veins patent. Pancreas: Pancreas is normal. No ductal dilatation. No pancreatic inflammation. Spleen: Normal spleen Adrenals/urinary tract: Stable nodule of the LEFT adrenal gland measures 2.2 cm. Previously characterized as adenoma. Stomach/Bowel: Stomach, duodenum, small-bowel appear normal. RIGHT hemicolectomy anatomy. No obstruction at the anastomosis. Oral contrast passes through the small bowel into the transverse colon through the anastomosis without gross evidence of leak. There is however large volume intraperitoneal free air in the RIGHT upper quadrant. This suggest perforated bowel or anastomotic breakdown. The transverse and descending colon are normal. Rectum appears normal. Within the central lower RIGHT abdomen large fluid collection measuring 14.5 by 11.6 cm. This collection is homogeneous low simple fluid attenuation cyst in centrally with a thin enhancing rim consistent with peritoneal abscess. (Image 101/2). The collection has a thin arm which extends to the LEFT pericolic gutter. No significant free fluid in the pelvis. Small amount free fluid anterior to the RIGHT hepatic lobe also with a peritoneal enhancement (image 72/2). Vascular/Lymphatic: Abdominal aorta is normal caliber with atherosclerotic calcification. There is no retroperitoneal or periportal lymphadenopathy. No pelvic lymphadenopathy. Reproductive: Prostate normal Other: Large intraperitoneal free air in the RIGHT upper quadrant described in the stomach bowel section. Large peritoneal abscess as described in the bowel section. Musculoskeletal: No aggressive osseous lesion. IMPRESSION: 1. Evidence all bowel perforation/anastomosed breakdown with large volume intraperitoneal free air fluid in the RIGHT upper quadrant adjacent and above the liver. 2. Large loculated fluid collection in the RIGHT anterior pelvis extending along the LEFT pericolic gutter consists with PERITONEAL ABSCESS. 3.  Of note, no large volume intraperitoneal free fluid. Oral contrast passed through the RIGHT colon anastomosis without evidence of leak. Findings could indicate a prior anastomotic breakdown which has healed in the interval with subsequent abscess formation from the prior leak / breakdown. Critical Value/emergent results were called by telephone at the time of interpretation on 06/12/2018 at 3:12 pm to Dr. Marylu Lund , who verbally acknowledged these results. Electronically Signed   By: Suzy Bouchard M.D.   On: 06/12/2018 15:12   Ct Abdomen Pelvis W Contrast  Result Date: 05/18/2018 CLINICAL DATA:  84 year old male with history of bloating and constipation for the past 2 days. Emesis today. EXAM: CT ABDOMEN AND PELVIS WITH CONTRAST  TECHNIQUE: Multidetector CT imaging of the abdomen and pelvis was performed using the standard protocol following bolus administration of intravenous contrast. CONTRAST:  166mL ISOVUE-300 IOPAMIDOL (ISOVUE-300) INJECTION 61% COMPARISON:  CT the abdomen and pelvis 05/22/2009. FINDINGS: Lower chest: Aortic atherosclerosis. Atherosclerotic calcifications noted in the left main, left anterior descending, left circumflex and right coronary arteries. Thickening calcification of the aortic valve. Lipomatous hypertrophy of the interatrial septum (normal anatomical variant) incidentally noted. Status post median sternotomy for CABG. Hepatobiliary: No cystic or solid hepatic lesions. No intra or extrahepatic biliary ductal dilatation. Gallbladder is normal in appearance. Pancreas: No pancreatic mass. No pancreatic ductal dilatation. No pancreatic or peripancreatic fluid or inflammatory changes. Spleen: Calcified granulomas throughout the spleen. Adrenals/Urinary Tract: 2.4 cm left adrenal nodule (axial image 35 of series 2), stable compared to remote prior examinations, previously characterized as an adenoma. Right adrenal gland is unremarkable. Multiple subcentimeter low-attenuation lesions  in the left kidney, too small to characterize, but statistically likely to represent tiny cysts. 1.4 cm simple cyst in the interpolar region of the left kidney. Vascular calcifications associated with both kidneys. Right kidney is otherwise unremarkable in appearance. No hydroureteronephrosis. Urinary bladder is normal in appearance. Stomach/Bowel: The appearance of the stomach is unremarkable. In the region of the hepatic flexure there is a mass-like area of mural thickening and luminal narrowing which measures approximately 4.7 x 4.5 x 4.7 cm (axial image 40 of series 2 and sagittal image 110 of series 6), highly concerning for primary colonic neoplasm. Slight haziness in the surrounding soft tissues is noted. Proximal to this there is dilatation of the cecum and ascending colon with liquid stool. More distal aspect of the colon and rectum are relatively decompressed. Multiple prominent borderline dilated loops of small bowel measuring up to 3.8 cm. Normal appendix. Vascular/Lymphatic: Aortic atherosclerosis, without evidence of aneurysm or dissection in the abdominal or pelvic vasculature. No lymphadenopathy noted in the abdomen or pelvis. Reproductive: Prostate gland and seminal vesicles are unremarkable in appearance. Other: No significant volume of ascites.  No pneumoperitoneum. Musculoskeletal: There are no aggressive appearing lytic or blastic lesions noted in the visualized portions of the skeleton. Median sternotomy wires. IMPRESSION: 1. There appears to be a nearly obstructing lesion in the region of the hepatic flexure of the colon which is highly concerning for primary colonic neoplasm. Slight haziness of the surrounding soft tissues may be indicative of early local invasion. This appears partially obstructive as evidenced by dilatation of more proximal aspects of the small bowel and colon. 2. Left adrenal lesion is stable compared to prior studies, previously characterized as an adenoma. 3. Aortic  atherosclerosis, in addition to left main and 3 vessel coronary artery disease. Status post median sternotomy for CABG. 4. Additional incidental findings, as above. Aortic Atherosclerosis (ICD10-I70.0). Electronically Signed   By: Vinnie Langton M.D.   On: 05/18/2018 22:07   Dg Chest Port 1 View  Result Date: 06/09/2018 CLINICAL DATA:  Shortness of breath. EXAM: PORTABLE CHEST 1 VIEW COMPARISON:  Radiograph of June 06, 2018 and June 02, 2018. FINDINGS: Stable cardiomediastinal silhouette. Status post coronary bypass graft. Atherosclerosis of thoracic aorta is noted. No pneumothorax is noted. Lungs are clear. No significant pleural effusion is noted. There is recurrence of pneumoperitoneum underneath the right hemidiaphragm. Bony thorax is unremarkable. IMPRESSION: No acute cardiopulmonary abnormality seen. Recurrence of pneumoperitoneum is noted under right hemidiaphragm concerning for postoperative change or rupture of hollow viscus. These results will be called to the ordering clinician or representative by the  Psychologist, clinical, and communication documented in the PACS or zVision Dashboard. Aortic Atherosclerosis (ICD10-I70.0). Electronically Signed   By: Marijo Conception, M.D.   On: 06/09/2018 11:59   Dg Chest Port 1 View  Result Date: 06/06/2018 CLINICAL DATA:  Shortness of breath, hypertension, CABG in May 2018, colonic malignancy, former smoker. EXAM: PORTABLE CHEST 1 VIEW COMPARISON:  PA and lateral chest x-ray of June 02, 2018 FINDINGS: The free subdiaphragmatic bowel gas seen previously has resolved. The lungs are borderline hypoinflated. The lung markings are coarse at both bases with possible small bilateral pleural effusions. There is no pleural effusion. The heart and pulmonary vascularity are normal. There are post CABG changes. There is calcification in the wall of the aortic arch. The bony thorax exhibits no acute abnormality. IMPRESSION: Borderline hypoinflation. Probable  bibasilar atelectasis and small effusions greatest on the right. No CHF. When the patient can tolerate the procedure, a PA and lateral chest x-ray with deep inspiratory effort would be useful. Thoracic aortic atherosclerosis. Electronically Signed   By: David  Martinique M.D.   On: 06/06/2018 10:05   Dg Chest Port 1 View  Result Date: 05/22/2018 CLINICAL DATA:  Status post NG tube placement today. Shortness of breath and abdominal pain. EXAM: PORTABLE CHEST 1 VIEW COMPARISON:  Plain films chest and abdomen 05/18/2018. FINDINGS: NG tube courses into the stomach and below the inferior margin of the film. Lungs are clear. Heart size is normal. Aortic atherosclerosis is noted. The patient is status post CABG. No pneumothorax or pleural effusion. No acute bony abnormality. IMPRESSION: NG tube courses into the stomach and below the inferior margin of the film. No acute disease. Atherosclerosis. Electronically Signed   By: Inge Rise M.D.   On: 05/22/2018 11:14   Dg Abdomen Acute W/chest  Result Date: 05/18/2018 CLINICAL DATA:  84 year old male with history of constipation, bloating and nausea for the past 4 days. EXAM: DG ABDOMEN ACUTE W/ 1V CHEST COMPARISON:  Chest x-ray 09/14/2017. FINDINGS: Lung volumes are normal. No consolidative airspace disease. No pleural effusions. No pneumothorax. No pulmonary nodule or mass noted. Pulmonary vasculature and the cardiomediastinal silhouette are within normal limits. Low aortic atherosclerosis. Status post median sternotomy. Multiple dilated loops of gas-filled small bowel are noted measuring up to 5.7 cm in diameter with several air-fluid levels noted on the upright projection. Gas and stool is noted throughout the colon extending to the level of the distal rectum. No pneumoperitoneum. IMPRESSION: 1. Nonspecific bowel gas pattern, as above. Findings could either reflect early or partial small bowel obstruction, or could be indicative of ileus. 2. No pneumoperitoneum. 3.  No radiographic evidence of acute cardiopulmonary disease. 4. Aortic atherosclerosis. Electronically Signed   By: Vinnie Langton M.D.   On: 05/18/2018 20:19   Dg Abd Portable 1v  Result Date: 05/26/2018 CLINICAL DATA:  Postop ileus EXAM: PORTABLE ABDOMEN - 1 VIEW COMPARISON:  05/25/2018 FINDINGS: NG tube remains present with the tip likely in the descending duodenum. Diffuse gaseous distention of bowel again noted, not significantly changed, likely ileus. No visible free air or organomegaly. IMPRESSION: Stable ileus pattern. Electronically Signed   By: Rolm Baptise M.D.   On: 05/26/2018 08:54   Dg Abd Portable 1v  Result Date: 05/25/2018 CLINICAL DATA:  Postoperative ileus. EXAM: PORTABLE ABDOMEN - 1 VIEW COMPARISON:  Radiographs of January 22, 2018. FINDINGS: Nasogastric tube tip is seen in expected position of distal stomach. Continued presence of small bowel dilatation is noted without significant colonic dilatation. IMPRESSION: Continued small  bowel dilatation is noted which may represent postoperative ileus, but follow-up radiographs are recommended to rule out distal small bowel obstruction. Electronically Signed   By: Marijo Conception, M.D.   On: 05/25/2018 09:28   Dg Abd Portable 1v  Result Date: 05/24/2018 CLINICAL DATA:  Abdominal distention, postop ileus EXAM: PORTABLE ABDOMEN - 1 VIEW COMPARISON:  05/23/2018 FINDINGS: NG tube has likely passed into the proximal descending duodenum. Continued diffuse gaseous distention of bowel, likely ileus. No visible free air organomegaly. IMPRESSION: Stable diffuse significant gaseous distention of bowel, likely ileus. Electronically Signed   By: Rolm Baptise M.D.   On: 05/24/2018 09:30   Dg Abd Portable 1v  Result Date: 05/23/2018 CLINICAL DATA:  Postoperative ileus EXAM: PORTABLE ABDOMEN - 1 VIEW COMPARISON:  05/22/2018 FINDINGS: NG tube tip remains in the stomach. Diffuse gaseous distention of bowel again noted, likely ileus, unchanged. IMPRESSION:  No significant change since prior study. Electronically Signed   By: Rolm Baptise M.D.   On: 05/23/2018 07:15   Dg Abd Portable 1v  Result Date: 05/19/2018 CLINICAL DATA:  84 year old male with a history surgery. Operative plain film for incomplete surgical instrument count EXAM: PORTABLE ABDOMEN - 1 VIEW COMPARISON:  05/18/2018 FINDINGS: Gas within small bowel, colon.  Borderline dilated small bowel. Surgical changes along the midline abdomen of laparotomy. Surgical changes of the right upper quadrant. Surgical changes of median sternotomy and CABG. No unexpected radiopaque foreign body. Calcifications of the vasculature. IMPRESSION: Plain film is negative for radiopaque foreign body. Surgical changes of the abdomen, as above. These results were called by telephone at the time of interpretation on 05/19/2018 at 12:48 pm to circulating team in OR 2, who verbally acknowledged these results. Electronically Signed   By: Corrie Mckusick D.O.   On: 05/19/2018 12:48    Labs:  CBC: Recent Labs    06/09/18 0420 06/10/18 0351 06/11/18 0504 06/13/18 0438  WBC 11.4* 11.0* 10.7* 11.6*  HGB 10.8* 10.1* 10.2* 10.5*  HCT 35.1* 32.7* 34.0* 34.8*  PLT 398 341 332 302    COAGS: Recent Labs    06/02/18 1801  INR 1.15    BMP: Recent Labs    06/10/18 0351 06/11/18 0504 06/12/18 0436 06/13/18 0438  NA 139 141 141 140  K 4.2 4.5 4.3 5.1  CL 96* 100 103 101  CO2 32 31 32 30  GLUCOSE 117* 110* 105* 119*  BUN 18 19 19 19   CALCIUM 8.3* 8.6* 8.2* 8.9  CREATININE 0.93 0.97 1.02 1.06  GFRNONAA >60 >60 >60 >60  GFRAA >60 >60 >60 >60    LIVER FUNCTION TESTS: Recent Labs    01/17/18 1401 05/18/18 1851 06/02/18 1700 06/13/18 0438  BILITOT 0.3 0.9 0.5 0.7  AST 15 28 47* 29  ALT 10 11 34 33  ALKPHOS 90 90 121 90  PROT 6.4 7.2 5.7* 5.3*  ALBUMIN 4.2 4.2 2.3* 2.2*    TUMOR MARKERS: No results for input(s): AFPTM, CEA, CA199, CHROMGRNA in the last 8760 hours.  Assessment and Plan:  Peritoneal  abscess s/p colectomy on 8/9 for colon adenocarcinoma - IR consulted for aspiration and drainage today. Patient discussed with Dr. Earleen Newport who is agreeable to proceed. Patient afebrile, WBC trending upward at 11.6 today, INR from 8/23 1.15 (will repeat today), NPO since midnight, received Lovenox 9/2 @ 2100.  Risks and benefits discussed with the patient including bleeding, infection, damage to adjacent structures, bowel perforation/fistula connection, and sepsis.  All of the patient's questions were answered,  patient is agreeable to proceed.  Consent signed and in chart.  Thank you for this interesting consult.  I greatly enjoyed meeting Vincent Mcclure and look forward to participating in their care.  A copy of this report was sent to the requesting provider on this date.  Electronically Signed: Joaquim Nam, PA-C 06/13/2018, 9:09 AM    I spent a total of 40 Minutes in face to face in clinical consultation, greater than 50% of which was counseling/coordinating care for peritoneal abscess aspiration and drain placement.

## 2018-06-13 NOTE — Progress Notes (Addendum)
[facility] Progress Note     Subjective: CC-  Up in chair. Overall doing well. Denies any current abdominal pain, nausea, or vomiting. NPO today for procedure but was tolerating regular diet yesterday. BM x3 yesterday.  CT yesterday showed 14.5x11.6 cm fluid collection within the central lower RIGHT abdomen; IR consulted and planning percutaneous drainage.  Objective: Vital signs in last 24 hours: Temp:  [97.6 F (36.4 C)-98.9 F (37.2 C)] 97.6 F (36.4 C) (09/03 0519) Pulse Rate:  [75-95] 75 (09/03 0519) Resp:  [18-20] 20 (09/03 0519) BP: (110-125)/(64-73) 110/69 (09/03 0519) SpO2:  [91 %-98 %] 98 % (09/03 0519) Weight:  [101.1 kg] 101.1 kg (09/03 0400) Last BM Date: 06/11/18  Intake/Output from previous day: 09/02 0701 - 09/03 0700 In: 1558.2 [P.O.:1330; IV Piggyback:228.2] Out: 1100 [Urine:1100] Intake/Output this shift: No intake/output data recorded.  PE: Gen:  Alert, NAD, pleasant Pulm:  effort normal Skin: no rashes noted, warm and dry Abd: Soft, nontender, +BS, midline incision healed except for 3x3x1cm area at distal aspect, slough noted at base (less than last week), tunnels proximally     Lab Results:  Recent Labs    06/11/18 0504 06/13/18 0438  WBC 10.7* 11.6*  HGB 10.2* 10.5*  HCT 34.0* 34.8*  PLT 332 302   BMET Recent Labs    06/12/18 0436 06/13/18 0438  NA 141 140  K 4.3 5.1  CL 103 101  CO2 32 30  GLUCOSE 105* 119*  BUN 19 19  CREATININE 1.02 1.06  CALCIUM 8.2* 8.9   PT/INR No results for input(s): LABPROT, INR in the last 72 hours. CMP     Component Value Date/Time   NA 140 06/13/2018 0438   NA 139 01/17/2018 1401   K 5.1 06/13/2018 0438   CL 101 06/13/2018 0438   CO2 30 06/13/2018 0438   GLUCOSE 119 (H) 06/13/2018 0438   BUN 19 06/13/2018 0438   BUN 13 01/17/2018 1401   CREATININE 1.06 06/13/2018 0438   CALCIUM 8.9 06/13/2018 0438   PROT 5.3 (L) 06/13/2018 0438   PROT 6.4 01/17/2018 1401   ALBUMIN 2.2 (L)  06/13/2018 0438   ALBUMIN 4.2 01/17/2018 1401   AST 29 06/13/2018 0438   ALT 33 06/13/2018 0438   ALKPHOS 90 06/13/2018 0438   BILITOT 0.7 06/13/2018 0438   BILITOT 0.3 01/17/2018 1401   GFRNONAA >60 06/13/2018 0438   GFRAA >60 06/13/2018 0438   Lipase     Component Value Date/Time   LIPASE 103 (H) 06/02/2018 1801       Studies/Results: Ct Chest W Contrast  Result Date: 06/12/2018 CLINICAL DATA:  RIGHT colectomy 05/19/2018 for colon adenocarcinoma. Suspicion pneumoperitoneum 1 chest radiograph EXAM: CT CHEST, ABDOMEN, AND PELVIS WITH CONTRAST TECHNIQUE: Multidetector CT imaging of the chest, abdomen and pelvis was performed following the standard protocol during bolus administration of intravenous contrast. CONTRAST:  147mL OMNIPAQUE IOHEXOL 300 MG/ML  SOLN COMPARISON:  Radiograph 06/09/2018, CT 05/18/2018 FINDINGS: CT CHEST FINDINGS Cardiovascular: Post CABG anatomy. No large pulmonary emboli identified. No pericardial effusion. Mediastinum/Nodes: No axillary supraclavicular adenopathy. No mediastinal hilar adenopathy. Esophagus normal. Lungs/Pleura: Moderate sized RIGHT pleural effusion with basilar atelectasis. Mild LEFT basilar atelectasis. No pneumonia. No pneumothorax. No pulmonary edema. Musculoskeletal: Degenerate spurring of the spine. Midline sternotomy. CT ABDOMEN AND PELVIS FINDINGS Hepatobiliary: Extensive extraperitoneal free air position anterior to the liver and lateral to the liver. No focal hepatic lesion. Gallbladder collapsed. Portal veins patent. Pancreas: Pancreas is normal. No ductal dilatation. No pancreatic  inflammation. Spleen: Normal spleen Adrenals/urinary tract: Stable nodule of the LEFT adrenal gland measures 2.2 cm. Previously characterized as adenoma. Stomach/Bowel: Stomach, duodenum, small-bowel appear normal. RIGHT hemicolectomy anatomy. No obstruction at the anastomosis. Oral contrast passes through the small bowel into the transverse colon through the  anastomosis without gross evidence of leak. There is however large volume intraperitoneal free air in the RIGHT upper quadrant. This suggest perforated bowel or anastomotic breakdown. The transverse and descending colon are normal. Rectum appears normal. Within the central lower RIGHT abdomen large fluid collection measuring 14.5 by 11.6 cm. This collection is homogeneous low simple fluid attenuation cyst in centrally with a thin enhancing rim consistent with peritoneal abscess. (Image 101/2). The collection has a thin arm which extends to the LEFT pericolic gutter. No significant free fluid in the pelvis. Small amount free fluid anterior to the RIGHT hepatic lobe also with a peritoneal enhancement (image 72/2). Vascular/Lymphatic: Abdominal aorta is normal caliber with atherosclerotic calcification. There is no retroperitoneal or periportal lymphadenopathy. No pelvic lymphadenopathy. Reproductive: Prostate normal Other: Large intraperitoneal free air in the RIGHT upper quadrant described in the stomach bowel section. Large peritoneal abscess as described in the bowel section. Musculoskeletal: No aggressive osseous lesion. IMPRESSION: 1. Evidence all bowel perforation/anastomosed breakdown with large volume intraperitoneal free air fluid in the RIGHT upper quadrant adjacent and above the liver. 2. Large loculated fluid collection in the RIGHT anterior pelvis extending along the LEFT pericolic gutter consists with PERITONEAL ABSCESS. 3. Of note, no large volume intraperitoneal free fluid. Oral contrast passed through the RIGHT colon anastomosis without evidence of leak. Findings could indicate a prior anastomotic breakdown which has healed in the interval with subsequent abscess formation from the prior leak / breakdown. Critical Value/emergent results were called by telephone at the time of interpretation on 06/12/2018 at 3:12 pm to Dr. Marylu Lund , who verbally acknowledged these results. Electronically Signed    By: Suzy Bouchard M.D.   On: 06/12/2018 15:12   Ct Abdomen Pelvis W Contrast  Result Date: 06/12/2018 CLINICAL DATA:  RIGHT colectomy 05/19/2018 for colon adenocarcinoma. Suspicion pneumoperitoneum 1 chest radiograph EXAM: CT CHEST, ABDOMEN, AND PELVIS WITH CONTRAST TECHNIQUE: Multidetector CT imaging of the chest, abdomen and pelvis was performed following the standard protocol during bolus administration of intravenous contrast. CONTRAST:  158mL OMNIPAQUE IOHEXOL 300 MG/ML  SOLN COMPARISON:  Radiograph 06/09/2018, CT 05/18/2018 FINDINGS: CT CHEST FINDINGS Cardiovascular: Post CABG anatomy. No large pulmonary emboli identified. No pericardial effusion. Mediastinum/Nodes: No axillary supraclavicular adenopathy. No mediastinal hilar adenopathy. Esophagus normal. Lungs/Pleura: Moderate sized RIGHT pleural effusion with basilar atelectasis. Mild LEFT basilar atelectasis. No pneumonia. No pneumothorax. No pulmonary edema. Musculoskeletal: Degenerate spurring of the spine. Midline sternotomy. CT ABDOMEN AND PELVIS FINDINGS Hepatobiliary: Extensive extraperitoneal free air position anterior to the liver and lateral to the liver. No focal hepatic lesion. Gallbladder collapsed. Portal veins patent. Pancreas: Pancreas is normal. No ductal dilatation. No pancreatic inflammation. Spleen: Normal spleen Adrenals/urinary tract: Stable nodule of the LEFT adrenal gland measures 2.2 cm. Previously characterized as adenoma. Stomach/Bowel: Stomach, duodenum, small-bowel appear normal. RIGHT hemicolectomy anatomy. No obstruction at the anastomosis. Oral contrast passes through the small bowel into the transverse colon through the anastomosis without gross evidence of leak. There is however large volume intraperitoneal free air in the RIGHT upper quadrant. This suggest perforated bowel or anastomotic breakdown. The transverse and descending colon are normal. Rectum appears normal. Within the central lower RIGHT abdomen large fluid  collection measuring 14.5 by 11.6 cm.  This collection is homogeneous low simple fluid attenuation cyst in centrally with a thin enhancing rim consistent with peritoneal abscess. (Image 101/2). The collection has a thin arm which extends to the LEFT pericolic gutter. No significant free fluid in the pelvis. Small amount free fluid anterior to the RIGHT hepatic lobe also with a peritoneal enhancement (image 72/2). Vascular/Lymphatic: Abdominal aorta is normal caliber with atherosclerotic calcification. There is no retroperitoneal or periportal lymphadenopathy. No pelvic lymphadenopathy. Reproductive: Prostate normal Other: Large intraperitoneal free air in the RIGHT upper quadrant described in the stomach bowel section. Large peritoneal abscess as described in the bowel section. Musculoskeletal: No aggressive osseous lesion. IMPRESSION: 1. Evidence all bowel perforation/anastomosed breakdown with large volume intraperitoneal free air fluid in the RIGHT upper quadrant adjacent and above the liver. 2. Large loculated fluid collection in the RIGHT anterior pelvis extending along the LEFT pericolic gutter consists with PERITONEAL ABSCESS. 3. Of note, no large volume intraperitoneal free fluid. Oral contrast passed through the RIGHT colon anastomosis without evidence of leak. Findings could indicate a prior anastomotic breakdown which has healed in the interval with subsequent abscess formation from the prior leak / breakdown. Critical Value/emergent results were called by telephone at the time of interpretation on 06/12/2018 at 3:12 pm to Dr. Marylu Lund , who verbally acknowledged these results. Electronically Signed   By: Suzy Bouchard M.D.   On: 06/12/2018 15:12    Anti-infectives: Anti-infectives (From admission, onward)   Start     Dose/Rate Route Frequency Ordered Stop   06/10/18 1200  vancomycin (VANCOCIN) 1,500 mg in sodium chloride 0.9 % 500 mL IVPB  Status:  Discontinued     1,500 mg 250 mL/hr over 120  Minutes Intravenous Every 24 hours 06/09/18 1055 06/12/18 0819   06/09/18 1200  ceFEPIme (MAXIPIME) 1 g in sodium chloride 0.9 % 100 mL IVPB     1 g 200 mL/hr over 30 Minutes Intravenous Every 8 hours 06/09/18 1023     06/09/18 1100  vancomycin (VANCOCIN) 2,000 mg in sodium chloride 0.9 % 500 mL IVPB     2,000 mg 250 mL/hr over 120 Minutes Intravenous  Once 06/09/18 1023 06/09/18 1349       Assessment/Plan History of alcohol use Hx of hypertension Hx of hyperlipidemia  Readmit 3/66/4403 Acute diastolic CHF exacerbation, HCAP Atrial fibrillation with history of RVR-no anticoagulation secondary to prior bleeding Failure to thrive/deconditioning  Obstructing colon mass/adenocarcinoma 1/8positive nodes  -S/P exploratory laparotomy, right hemicolectomy with primary anastomosis, 05/19/2018, Dr. Fanny Skates   -Intra-abdominal abscess  - CT 9/2 showed 14.5x11.6 cm fluid collection within the central lower RIGHT abdomen; IR consulted and planning percutaneous drainage  - going for perc drain in IR today  FEN: NPO for procedure ID: merrem 8/30>>, vancomycin 8/30>>9/2 Follow up: Dr. Waynetta Pean, DO - PCP DVT: Lovenox (hold for procedure)  Plan: Continue BID wet to dry dressing changes to midline abdominal wound.   IR today for perc drain. Ok to resume diet after procedure.   LOS: 8 days   Vincent Mcclure , [facility] 06/13/2018, 9:26 AM Pager: [phone] Consults: [phone]  Agree with above. Wife in room.  For perc drain for RLQ abscess by IR.  Alphonsa Overall, MD, [facility] Pager: [phone] Office phone:  [phone]

## 2018-06-14 ENCOUNTER — Encounter: Payer: Self-pay | Admitting: *Deleted

## 2018-06-14 ENCOUNTER — Other Ambulatory Visit: Payer: Self-pay | Admitting: *Deleted

## 2018-06-14 DIAGNOSIS — K651 Peritoneal abscess: Secondary | ICD-10-CM

## 2018-06-14 DIAGNOSIS — I10 Essential (primary) hypertension: Secondary | ICD-10-CM

## 2018-06-14 LAB — COMPREHENSIVE METABOLIC PANEL
ALK PHOS: 79 U/L (ref 38–126)
ALT: 26 U/L (ref 0–44)
ANION GAP: 7 (ref 5–15)
AST: 20 U/L (ref 15–41)
Albumin: 1.9 g/dL — ABNORMAL LOW (ref 3.5–5.0)
BUN: 19 mg/dL (ref 8–23)
CALCIUM: 8.7 mg/dL — AB (ref 8.9–10.3)
CO2: 33 mmol/L — ABNORMAL HIGH (ref 22–32)
CREATININE: 0.87 mg/dL (ref 0.61–1.24)
Chloride: 101 mmol/L (ref 98–111)
Glucose, Bld: 120 mg/dL — ABNORMAL HIGH (ref 70–99)
Potassium: 4 mmol/L (ref 3.5–5.1)
SODIUM: 141 mmol/L (ref 135–145)
Total Bilirubin: 0.4 mg/dL (ref 0.3–1.2)
Total Protein: 5.2 g/dL — ABNORMAL LOW (ref 6.5–8.1)

## 2018-06-14 LAB — CBC
HCT: 31.1 % — ABNORMAL LOW (ref 39.0–52.0)
Hemoglobin: 9.6 g/dL — ABNORMAL LOW (ref 13.0–17.0)
MCH: 27.8 pg (ref 26.0–34.0)
MCHC: 30.9 g/dL (ref 30.0–36.0)
MCV: 90.1 fL (ref 78.0–100.0)
Platelets: 290 10*3/uL (ref 150–400)
RBC: 3.45 MIL/uL — AB (ref 4.22–5.81)
RDW: 14.6 % (ref 11.5–15.5)
WBC: 9.7 10*3/uL (ref 4.0–10.5)

## 2018-06-14 LAB — CULTURE, BLOOD (ROUTINE X 2)
Culture: NO GROWTH
Culture: NO GROWTH
SPECIAL REQUESTS: ADEQUATE
Special Requests: ADEQUATE

## 2018-06-14 LAB — GLUCOSE, CAPILLARY: Glucose-Capillary: 140 mg/dL — ABNORMAL HIGH (ref 70–99)

## 2018-06-14 MED ORDER — TAB-A-VITE/IRON PO TABS
1.0000 | ORAL_TABLET | Freq: Every day | ORAL | Status: DC
  Administered 2018-06-14 – 2018-06-22 (×9): 1 via ORAL
  Filled 2018-06-14 (×9): qty 1

## 2018-06-14 MED ORDER — TRAMADOL HCL 50 MG PO TABS
50.0000 mg | ORAL_TABLET | Freq: Two times a day (BID) | ORAL | Status: DC | PRN
  Administered 2018-06-16 – 2018-06-19 (×4): 50 mg via ORAL
  Filled 2018-06-14 (×4): qty 1

## 2018-06-14 MED ORDER — OXYCODONE HCL 5 MG PO TABS
5.0000 mg | ORAL_TABLET | Freq: Four times a day (QID) | ORAL | Status: DC | PRN
  Administered 2018-06-15 (×2): 5 mg via ORAL
  Filled 2018-06-14 (×2): qty 1

## 2018-06-14 NOTE — Care Management Important Message (Signed)
Important Message  Patient Details  Name: Vincent Mcclure MRN: [id number] Date of Birth: [birth year]   Medicare Important Message Given:  Yes    Kerin Salen 06/14/2018, 12:13 [place] Message  Patient Details  Name: Vincent Mcclure MRN: [id number] Date of Birth: 25-Apr-1932   Medicare Important Message Given:  Yes    Kerin Salen 06/14/2018, 12:13 PM

## 2018-06-14 NOTE — Progress Notes (Signed)
Physical Therapy Treatment Patient Details Name: Vincent Mcclure MRN: [id number] DOB: 1932/04/02 Today's Date: 06/14/2018    History of Present Illness 84 y.o. male with medical history significant of CAD s/p CABG in 2018, PAF not on anticoagulation due to h/o GIB in past, and s/p exp lap with colectomy 05/19/18.  He was admitted from his follow up surgical appt due to weakness and congestion.     PT Comments    Pt feeling "tired" and having some R side pain near drain site.  Assisted OOB to amb to bathroom.  Assisted in bathroom then pt was too tired to amb in hallway so assisted to  recliner.  Due to extended length of stay and medical complexities, pt will need ST rehab at SNF prior to returning home.   Follow Up Recommendations  SNF     Equipment Recommendations  None recommended by PT    Recommendations for Other Services       Precautions / Restrictions Precautions Precautions: Fall Precaution Comments: watch HR and sats Restrictions Weight Bearing Restrictions: No    Mobility  Bed Mobility Overal bed mobility: Needs Assistance Bed Mobility: Supine to Sit     Supine to sit: Min assist;HOB elevated     General bed mobility comments: assist for upper body and increased time to complete scooting to EOB.  Required a seated rest break.    Transfers Overall transfer level: Needs assistance Equipment used: None;Rolling walker (2 wheeled) Transfers: Sit to/from Stand Sit to Stand: Min assist Stand pivot transfers: From elevated surface       General transfer comment: assist to power up and to control descent. VCS safety, hand placement. Increased time.  Also assisted with toilet transfer  Ambulation/Gait Ambulation/Gait assistance: Min assist Gait Distance (Feet): 18 Feet Assistive device: Rolling walker (2 wheeled) Gait Pattern/deviations: Step-through pattern;Decreased stride length Gait velocity: decreased    General Gait Details: decreased amb distance due to  increased c/o fatigue after using bathroom.  Trial RA stas decreased to 85%.   Stairs             Wheelchair Mobility    Modified Rankin (Stroke Patients Only)       Balance                                            Cognition Arousal/Alertness: Awake/alert Behavior During Therapy: WFL for tasks assessed/performed Overall Cognitive Status: Within Functional Limits for tasks assessed                                 General Comments: pleasant      Exercises      General Comments        Pertinent Vitals/Pain Pain Assessment: No/denies pain    Home Living                      Prior Function            PT Goals (current goals can now be found in the care plan section) Progress towards PT goals: Progressing toward goals    Frequency    Min 3X/week      PT Plan Current plan remains appropriate    Co-evaluation              AM-PAC PT "6  Clicks" Daily Activity  Outcome Measure  Difficulty turning over in bed (including adjusting bedclothes, sheets and blankets)?: A Lot Difficulty moving from lying on back to sitting on the side of the bed? : A Lot Difficulty sitting down on and standing up from a chair with arms (e.g., wheelchair, bedside commode, etc,.)?: A Lot Help needed moving to and from a bed to chair (including a wheelchair)?: A Lot Help needed walking in hospital room?: A Lot Help needed climbing 3-5 steps with a railing? : Total 6 Click Score: 11    End of Session Equipment Utilized During Treatment: Gait belt;Oxygen Activity Tolerance: Patient limited by fatigue Patient left: in chair;with call bell/phone within reach;with family/visitor present Nurse Communication: Mobility status PT Visit Diagnosis: Unsteadiness on feet (R26.81);Muscle weakness (generalized) (M62.81);History of falling (Z91.81);Difficulty in walking, not elsewhere classified (R26.2)     Time: 2909-0301 PT Time  Calculation (min) (ACUTE ONLY): 24 min  Charges:  $Gait Training: 8-22 mins $Therapeutic Activity: 8-22 mins                     {Kolby Schara  PTA [facility]  Acute  Rehab Pager      [phone]

## 2018-06-14 NOTE — Progress Notes (Signed)
Occupational Therapy Treatment Patient Details Name: Vincent Mcclure MRN: [id number] DOB: 01-22-1932 Today's Date: 06/14/2018    History of present illness 84 y.o. male with medical history significant of CAD s/p CABG in 2018, PAF not on anticoagulation due to h/o GIB in past, and s/p exp lap with colectomy 05/19/18.  He was admitted from his follow up surgical appt due to weakness and congestion.    OT comments  Pt progressing towards acute OT goals. Focus of session was grooming task standing at sink. Pt reporting fatigue at end of session. Assist needed for dynamic balance. Pt needing repeated cueing for safety with line management. Steadying assist especially during turns. Decreased safety awareness noted. D/c plan remains appropriate.    Follow Up Recommendations  SNF;Supervision/Assistance - 24 hour    Equipment Recommendations  3 in 1 bedside commode    Recommendations for Other Services      Precautions / Restrictions Precautions Precautions: Fall Precaution Comments: watch HR and sats Restrictions Weight Bearing Restrictions: No       Mobility Bed Mobility Overal bed mobility: Needs Assistance Bed Mobility: Supine to Sit;Sit to Supine     Supine to sit: Min guard;Min assist;HOB elevated Sit to supine: Min guard   General bed mobility comments: Pt using bed rails, momentum, and elevated HOB. Extra time and effort  Transfers Overall transfer level: Needs assistance Equipment used: None Transfers: Sit to/from Stand Sit to Stand: Min assist Stand pivot transfers: From elevated surface       General transfer comment: declined rw during session. Assist to steady. Repeated cues for safety with line management.    Balance Overall balance assessment: Needs assistance Sitting-balance support: No upper extremity supported;Feet supported Sitting balance-Leahy Scale: Good     Standing balance support: Bilateral upper extremity supported;During functional  activity Standing balance-Leahy Scale: Fair Standing balance comment: reliant on UEs for dynamic activities                           ADL either performed or assessed with clinical judgement   ADL Overall ADL's : Needs assistance/impaired     Grooming: Min guard;Wash/dry hands;Standing Grooming Details (indicate cue type and reason): stood duration of one grooming task. Leaning into sink for support. Min steadying assist on abrupt turns.                              Functional mobility during ADLs: Minimal assistance General ADL Comments: Pt completed bed mobility, walked to/from bathroom, 1 grooming task in standing position. Reporting fatigue at end o fsession     Vision       Perception     Praxis      Cognition Arousal/Alertness: Awake/alert Behavior During Therapy: [facility] for tasks assessed/performed;Flat affect Overall Cognitive Status: No family/caregiver present to determine baseline cognitive functioning Area of Impairment: Safety/judgement;Problem solving                         Safety/Judgement: Decreased awareness of safety;Decreased awareness of deficits   Problem Solving: Slow processing General Comments: pleasant. internally distracted.        Exercises     Shoulder Instructions       General Comments      Pertinent Vitals/ Pain       Pain Assessment: Faces Faces Pain Scale: Hurts a little bit Pain Location: ABD  incision Pain Descriptors /  Indicators: Tightness;Discomfort Pain Intervention(s): Limited activity within patient's tolerance;Monitored during session;Repositioned  Home Living                                          Prior Functioning/Environment              Frequency  Min 2X/week        Progress Toward Goals  OT Goals(current goals can now be found in the care plan section)  Progress towards OT goals: Progressing toward goals  Acute Rehab OT Goals Patient Stated  Goal: get stronger OT Goal Formulation: With patient Time For Goal Achievement: 06/19/18 Potential to Achieve Goals: Good ADL Goals Pt Will Perform Grooming: standing;with supervision Pt Will Perform Lower Body Bathing: with supervision;sit to/from stand Pt Will Perform Upper Body Dressing: with set-up;sitting Pt Will Perform Lower Body Dressing: with supervision;sit to/from stand;with adaptive equipment Pt Will Transfer to Toilet: with supervision;ambulating;regular height toilet Pt Will Perform Toileting - Clothing Manipulation and hygiene: with supervision;sit to/from stand  Plan Discharge plan remains appropriate    Co-evaluation                 AM-PAC PT "6 Clicks" Daily Activity     Outcome Measure   Help from another person eating meals?: None Help from another person taking care of personal grooming?: A Little Help from another person toileting, which includes using toliet, bedpan, or urinal?: A Little Help from another person bathing (including washing, rinsing, drying)?: A Lot Help from another person to put on and taking off regular upper body clothing?: A Little Help from another person to put on and taking off regular lower body clothing?: A Lot 6 Click Score: 17    End of Session Equipment Utilized During Treatment: Gait belt  OT Visit Diagnosis: Muscle weakness (generalized) (M62.81)   Activity Tolerance Patient tolerated treatment well;Patient limited by fatigue   Patient Left in bed;with call bell/phone within reach;with bed alarm set(spouse stepped out of room for session)   Nurse Communication          Time: 8527-7824 OT Time Calculation (min): 19 min  Charges: OT General Charges $OT Visit: 1 Visit OT Treatments $Self Care/Home Management : 8-22 mins     Hortencia Pilar 06/14/2018, 1:12 PM

## 2018-06-14 NOTE — Progress Notes (Signed)
Referring Physician(s): Chiu,S/Newman,D  Supervising Physician: Arne Cleveland  Patient Status:  [facility] - In-pt  Chief Complaint:  Colon cancer, abdominal fluid collection  Subjective: Pt has some soreness at RLQ drain site, some dyspnea/chest congestion   Allergies: No known allergies  Medications: Prior to Admission medications   Medication Sig Start Date End Date Taking? Authorizing Provider  acetaminophen (TYLENOL) 500 MG tablet Take 1 tablet (500 mg total) by mouth every 6 (six) hours as needed for mild pain. 05/30/18  Yes Rayburn, Floyce Stakes, PA-C  aspirin EC 81 MG EC tablet Take 1 tablet (81 mg total) by mouth daily. 02/25/17  Yes Conte, Tessa N, PA-C  furosemide (LASIX) 40 MG tablet Take 1 tablet (40 mg total) by mouth daily. 08/10/17 08/10/18 Yes Dorothy Spark, MD  gabapentin (NEURONTIN) 100 MG capsule Take 1 capsule (100 mg total) by mouth 2 (two) times daily. 05/30/18  Yes Rayburn, Claiborne Billings A, PA-C  metoprolol succinate (TOPROL XL) 50 MG 24 hr tablet Take 1 tablet (50 mg total) by mouth daily. Take with or immediately following a meal. 05/24/17  Yes Imogene Burn, PA-C  polyethylene glycol [facility]) packet Take 17 g by mouth daily. 05/30/18  Yes Rayburn, Claiborne Billings A, PA-C  pravastatin (PRAVACHOL) 40 MG tablet I po q hs Patient taking differently: Take 40 mg by mouth daily.  01/17/18  Yes Opalski, Deborah, DO  alum & mag hydroxide-simeth (MAALOX/MYLANTA) 200-200-20 MG/5ML suspension Take 30 mLs by mouth every 6 (six) hours as needed for indigestion (gas/bloating). Patient not taking: Reported on 06/02/2018 05/30/18   Rayburn, Claiborne Billings A, PA-C  ferrous sulfate 325 (65 FE) MG EC tablet Take 1 tablet (325 mg total) by mouth 3 (three) times daily with meals. Patient not taking: Reported on 06/02/2018 01/17/18   [name], DO  traMADol (ULTRAM) 50 MG tablet Take 1 tablet (50 mg total) by mouth every 6 (six) hours as needed for moderate pain or severe pain. Patient not taking:  Reported on 06/02/2018 05/30/18   Rayburn, Floyce Stakes, PA-C     Vital Signs: BP (!) 99/55 (BP Location: Right Arm)   Pulse 68   Temp 98.2 F (36.8 C) (Oral)   Resp 20   Ht 5\' 11"  (1.803 m)   Wt 214 lb 4.6 oz (97.2 kg)   SpO2 98%   BMI 29.89 kg/m   Physical Exam RLQ drain intact, insertion site ok, mildly tender, output 200 cc serous fluid; drain flushed with minimal return  Imaging: Ct Chest W Contrast  Result Date: 06/12/2018 CLINICAL DATA:  RIGHT colectomy 05/19/2018 for colon adenocarcinoma. Suspicion pneumoperitoneum 1 chest radiograph EXAM: CT CHEST, ABDOMEN, AND PELVIS WITH CONTRAST TECHNIQUE: Multidetector CT imaging of the chest, abdomen and pelvis was performed following the standard protocol during bolus administration of intravenous contrast. CONTRAST:  180mL OMNIPAQUE IOHEXOL 300 MG/ML  SOLN COMPARISON:  Radiograph 06/09/2018, CT 05/18/2018 FINDINGS: CT CHEST FINDINGS Cardiovascular: Post CABG anatomy. No large pulmonary emboli identified. No pericardial effusion. Mediastinum/Nodes: No axillary supraclavicular adenopathy. No mediastinal hilar adenopathy. Esophagus normal. Lungs/Pleura: Moderate sized RIGHT pleural effusion with basilar atelectasis. Mild LEFT basilar atelectasis. No pneumonia. No pneumothorax. No pulmonary edema. Musculoskeletal: Degenerate spurring of the spine. Midline sternotomy. CT ABDOMEN AND PELVIS FINDINGS Hepatobiliary: Extensive extraperitoneal free air position anterior to the liver and lateral to the liver. No focal hepatic lesion. Gallbladder collapsed. Portal veins patent. Pancreas: Pancreas is normal. No ductal dilatation. No pancreatic inflammation. Spleen: Normal spleen Adrenals/urinary tract: Stable nodule of the LEFT adrenal  gland measures 2.2 cm. Previously characterized as adenoma. Stomach/Bowel: Stomach, duodenum, small-bowel appear normal. RIGHT hemicolectomy anatomy. No obstruction at the anastomosis. Oral contrast passes through the small bowel into  the transverse colon through the anastomosis without gross evidence of leak. There is however large volume intraperitoneal free air in the RIGHT upper quadrant. This suggest perforated bowel or anastomotic breakdown. The transverse and descending colon are normal. Rectum appears normal. Within the central lower RIGHT abdomen large fluid collection measuring 14.5 by 11.6 cm. This collection is homogeneous low simple fluid attenuation cyst in centrally with a thin enhancing rim consistent with peritoneal abscess. (Image 101/2). The collection has a thin arm which extends to the LEFT pericolic gutter. No significant free fluid in the pelvis. Small amount free fluid anterior to the RIGHT hepatic lobe also with a peritoneal enhancement (image 72/2). Vascular/Lymphatic: Abdominal aorta is normal caliber with atherosclerotic calcification. There is no retroperitoneal or periportal lymphadenopathy. No pelvic lymphadenopathy. Reproductive: Prostate normal Other: Large intraperitoneal free air in the RIGHT upper quadrant described in the stomach bowel section. Large peritoneal abscess as described in the bowel section. Musculoskeletal: No aggressive osseous lesion. IMPRESSION: 1. Evidence all bowel perforation/anastomosed breakdown with large volume intraperitoneal free air fluid in the RIGHT upper quadrant adjacent and above the liver. 2. Large loculated fluid collection in the RIGHT anterior pelvis extending along the LEFT pericolic gutter consists with PERITONEAL ABSCESS. 3. Of note, no large volume intraperitoneal free fluid. Oral contrast passed through the RIGHT colon anastomosis without evidence of leak. Findings could indicate a prior anastomotic breakdown which has healed in the interval with subsequent abscess formation from the prior leak / breakdown. Critical Value/emergent results were called by telephone at the time of interpretation on 06/12/2018 at 3:12 pm to Dr. Marylu Lund , who verbally acknowledged these  results. Electronically Signed   By: Suzy Bouchard M.D.   On: 06/12/2018 15:12   Ct Abdomen Pelvis W Contrast  Result Date: 06/12/2018 CLINICAL DATA:  RIGHT colectomy 05/19/2018 for colon adenocarcinoma. Suspicion pneumoperitoneum 1 chest radiograph EXAM: CT CHEST, ABDOMEN, AND PELVIS WITH CONTRAST TECHNIQUE: Multidetector CT imaging of the chest, abdomen and pelvis was performed following the standard protocol during bolus administration of intravenous contrast. CONTRAST:  182mL OMNIPAQUE IOHEXOL 300 MG/ML  SOLN COMPARISON:  Radiograph 06/09/2018, CT 05/18/2018 FINDINGS: CT CHEST FINDINGS Cardiovascular: Post CABG anatomy. No large pulmonary emboli identified. No pericardial effusion. Mediastinum/Nodes: No axillary supraclavicular adenopathy. No mediastinal hilar adenopathy. Esophagus normal. Lungs/Pleura: Moderate sized RIGHT pleural effusion with basilar atelectasis. Mild LEFT basilar atelectasis. No pneumonia. No pneumothorax. No pulmonary edema. Musculoskeletal: Degenerate spurring of the spine. Midline sternotomy. CT ABDOMEN AND PELVIS FINDINGS Hepatobiliary: Extensive extraperitoneal free air position anterior to the liver and lateral to the liver. No focal hepatic lesion. Gallbladder collapsed. Portal veins patent. Pancreas: Pancreas is normal. No ductal dilatation. No pancreatic inflammation. Spleen: Normal spleen Adrenals/urinary tract: Stable nodule of the LEFT adrenal gland measures 2.2 cm. Previously characterized as adenoma. Stomach/Bowel: Stomach, duodenum, small-bowel appear normal. RIGHT hemicolectomy anatomy. No obstruction at the anastomosis. Oral contrast passes through the small bowel into the transverse colon through the anastomosis without gross evidence of leak. There is however large volume intraperitoneal free air in the RIGHT upper quadrant. This suggest perforated bowel or anastomotic breakdown. The transverse and descending colon are normal. Rectum appears normal. Within the  central lower RIGHT abdomen large fluid collection measuring 14.5 by 11.6 cm. This collection is homogeneous low simple fluid attenuation cyst in centrally with  a thin enhancing rim consistent with peritoneal abscess. (Image 101/2). The collection has a thin arm which extends to the LEFT pericolic gutter. No significant free fluid in the pelvis. Small amount free fluid anterior to the RIGHT hepatic lobe also with a peritoneal enhancement (image 72/2). Vascular/Lymphatic: Abdominal aorta is normal caliber with atherosclerotic calcification. There is no retroperitoneal or periportal lymphadenopathy. No pelvic lymphadenopathy. Reproductive: Prostate normal Other: Large intraperitoneal free air in the RIGHT upper quadrant described in the stomach bowel section. Large peritoneal abscess as described in the bowel section. Musculoskeletal: No aggressive osseous lesion. IMPRESSION: 1. Evidence all bowel perforation/anastomosed breakdown with large volume intraperitoneal free air fluid in the RIGHT upper quadrant adjacent and above the liver. 2. Large loculated fluid collection in the RIGHT anterior pelvis extending along the LEFT pericolic gutter consists with PERITONEAL ABSCESS. 3. Of note, no large volume intraperitoneal free fluid. Oral contrast passed through the RIGHT colon anastomosis without evidence of leak. Findings could indicate a prior anastomotic breakdown which has healed in the interval with subsequent abscess formation from the prior leak / breakdown. Critical Value/emergent results were called by telephone at the time of interpretation on 06/12/2018 at 3:12 pm to Dr. Marylu Lund , who verbally acknowledged these results. Electronically Signed   By: Suzy Bouchard M.D.   On: 06/12/2018 15:12   Ct Image Guided Drainage By Percutaneous Catheter  Result Date: 06/13/2018 INDICATION: 84 year old male with a history of right lower quadrant fluid collection. He has been referred for drainage. EXAM: CT-GUIDED  DRAINAGE OF RIGHT LOWER QUADRANT FLUID COLLECTION MEDICATIONS: The patient is currently admitted to the hospital and receiving intravenous antibiotics. The antibiotics were administered within an appropriate time frame prior to the initiation of the procedure. ANESTHESIA/SEDATION: 2.0 mg IV Versed 100 mcg IV Fentanyl Moderate Sedation Time:  22 minutes The patient was continuously monitored during the procedure by the interventional radiology nurse under my direct supervision. COMPLICATIONS: None TECHNIQUE: Informed written consent was obtained from the patient after a thorough discussion of the procedural risks, benefits and alternatives. All questions were addressed. Maximal Sterile Barrier Technique was utilized including caps, mask, sterile gowns, sterile gloves, sterile drape, hand hygiene and skin antiseptic. A timeout was performed prior to the initiation of the procedure. PROCEDURE: The right lower quadrant was prepped with chlorhexidine in a sterile fashion, and a sterile drape was applied covering the operative field. A sterile gown and sterile gloves were used for the procedure. Local anesthesia was provided with 1% Lidocaine. Once the patient is prepped and draped in the usual sterile fashion, 1% lidocaine was used for local anesthesia. Using CT guidance, trocar needle was advanced into the fluid collection of the right lower quadrant. Modified Seldinger technique was used to place a 12 [place] drain. Drain was sutured in position. Approximately 450 cc of murky yellow fluid aspirated sample was sent for culture. Drain was attached to gravity drainage. Patient tolerated the procedure well and remained hemodynamically stable throughout. No complications were encountered and no significant blood loss. FINDINGS: CT again demonstrates fluid collection the right lower quadrant. Status post drain placement, there is significant reduction in fluid after aspiration of 450 cc of fluid. IMPRESSION: Status post drain  placement of right lower quadrant fluid collection. Signed, Dulcy Fanny. Dellia Nims, RPVI [facility] Electronically Signed   By: Corrie Mckusick D.O.   On: 06/13/2018 17:17    Labs:  CBC: Recent Labs    06/10/18 0351 06/11/18 0504 06/13/18 2585  06/14/18 0451  WBC 11.0* 10.7* 11.6* 9.7  HGB 10.1* 10.2* 10.5* 9.6*  HCT 32.7* 34.0* 34.8* 31.1*  PLT 341 332 302 290    COAGS: Recent Labs    06/02/18 1801 06/13/18 0957  INR 1.15 1.20  APTT  --  37*    BMP: Recent Labs    06/11/18 0504 06/12/18 0436 06/13/18 0438 06/14/18 0451  NA 141 141 140 141  K 4.5 4.3 5.1 4.0  CL 100 103 101 101  CO2 31 32 30 33*  GLUCOSE 110* 105* 119* 120*  BUN 19 19 19 19   CALCIUM 8.6* 8.2* 8.9 8.7*  CREATININE 0.97 1.02 1.06 0.87  GFRNONAA >60 >60 >60 >60  GFRAA >60 >60 >60 >60    LIVER FUNCTION TESTS: Recent Labs    05/18/18 1851 06/02/18 1700 06/13/18 0438 06/14/18 0451  BILITOT 0.9 0.5 0.7 0.4  AST 28 47* 29 20  ALT 11 34 33 26  ALKPHOS 90 121 90 79  PROT 7.2 5.7* 5.3* 5.2*  ALBUMIN 4.2 2.3* 2.2* 1.9*    Assessment and Plan: Pt with hx colon ca, s/p rt colectomy 05/19/18; now with RLQ peritoneal fluid collection, s/p drain placement 9/3; afebrile; WBC nl; hgb 9.6(10.5), creat nl; fluid cx neg to date; check fluid cytology; follow up CXR in am for small-mod rt effusion; once drain output minimal for >48 hrs repeat CT before considering drain removal   Electronically Signed: D. Rowe Robert, PA-C 06/14/2018, 1:11 PM   I spent a total of  15 minutes at the the patient's bedside AND on the patient's hospital floor or unit, greater than 50% of which was counseling/coordinating care for abdominal drain    Patient ID: Vincent Mcclure, male   DOB: 1932/03/27, 84 y.o.   MRN: [id number]

## 2018-06-14 NOTE — Progress Notes (Addendum)
[facility] Progress Note     Subjective: CC-  Sitting up eating breakfast. A little sore from drain placement yesterday (~450cc of murky yellow fluid aspirated), but overall feels about the same. Denies n/v. tolerating diet.   WBC 9.7, TMAX 99.4  Objective: Vital signs in last 24 hours: Temp:  [98.3 F (36.8 C)-99.4 F (37.4 C)] 98.3 F (36.8 C) (09/04 0458) Pulse Rate:  [77-99] 77 (09/04 0458) Resp:  [16-31] 18 (09/04 0458) BP: (113-148)/(57-96) 115/63 (09/04 0458) SpO2:  [90 %-98 %] 98 % (09/04 0458) Weight:  [97.2 kg] 97.2 kg (09/04 0504) Last BM Date: 06/13/18  Intake/Output from previous day: 09/03 0701 - 09/04 0700 In: 1792.2 [I.V.:162.2; IV Piggyback:1624.9] Out: 1330 [Urine:1130; Drains:200] Intake/Output this shift: No intake/output data recorded.  PE: Gen: Alert, NAD, pleasant Pulm:effort normal Skin: no rashes noted, warm and dry Abd: Soft, mild TTP around drain, +BS, I did not change midline wound dressing today, drain with murky/sanguinous fluid in pouch  Lab Results:  Recent Labs    06/13/18 0438 06/14/18 0451  WBC 11.6* 9.7  HGB 10.5* 9.6*  HCT 34.8* 31.1*  PLT 302 290   BMET Recent Labs    06/13/18 0438 06/14/18 0451  NA 140 141  K 5.1 4.0  CL 101 101  CO2 30 33*  GLUCOSE 119* 120*  BUN 19 19  CREATININE 1.06 0.87  CALCIUM 8.9 8.7*   PT/INR Recent Labs    06/13/18 0957  LABPROT 15.1  INR 1.20   CMP     Component Value Date/Time   NA 141 06/14/2018 0451   NA 139 01/17/2018 1401   K 4.0 06/14/2018 0451   CL 101 06/14/2018 0451   CO2 33 (H) 06/14/2018 0451   GLUCOSE 120 (H) 06/14/2018 0451   BUN 19 06/14/2018 0451   BUN 13 01/17/2018 1401   CREATININE 0.87 06/14/2018 0451   CALCIUM 8.7 (L) 06/14/2018 0451   PROT 5.2 (L) 06/14/2018 0451   PROT 6.4 01/17/2018 1401   ALBUMIN 1.9 (L) 06/14/2018 0451   ALBUMIN 4.2 01/17/2018 1401   AST 20 06/14/2018 0451   ALT 26 06/14/2018 0451   ALKPHOS 79 06/14/2018  0451   BILITOT 0.4 06/14/2018 0451   BILITOT 0.3 01/17/2018 1401   GFRNONAA >60 06/14/2018 0451   GFRAA >60 06/14/2018 0451   Lipase     Component Value Date/Time   LIPASE 103 (H) 06/02/2018 1801       Studies/Results: Ct Chest W Contrast  Result Date: 06/12/2018 CLINICAL DATA:  RIGHT colectomy 05/19/2018 for colon adenocarcinoma. Suspicion pneumoperitoneum 1 chest radiograph EXAM: CT CHEST, ABDOMEN, AND PELVIS WITH CONTRAST TECHNIQUE: Multidetector CT imaging of the chest, abdomen and pelvis was performed following the standard protocol during bolus administration of intravenous contrast. CONTRAST:  110mL OMNIPAQUE IOHEXOL 300 MG/ML  SOLN COMPARISON:  Radiograph 06/09/2018, CT 05/18/2018 FINDINGS: CT CHEST FINDINGS Cardiovascular: Post CABG anatomy. No large pulmonary emboli identified. No pericardial effusion. Mediastinum/Nodes: No axillary supraclavicular adenopathy. No mediastinal hilar adenopathy. Esophagus normal. Lungs/Pleura: Moderate sized RIGHT pleural effusion with basilar atelectasis. Mild LEFT basilar atelectasis. No pneumonia. No pneumothorax. No pulmonary edema. Musculoskeletal: Degenerate spurring of the spine. Midline sternotomy. CT ABDOMEN AND PELVIS FINDINGS Hepatobiliary: Extensive extraperitoneal free air position anterior to the liver and lateral to the liver. No focal hepatic lesion. Gallbladder collapsed. Portal veins patent. Pancreas: Pancreas is normal. No ductal dilatation. No pancreatic inflammation. Spleen: Normal spleen Adrenals/urinary tract: Stable nodule of the LEFT adrenal gland measures 2.2 cm.  Previously characterized as adenoma. Stomach/Bowel: Stomach, duodenum, small-bowel appear normal. RIGHT hemicolectomy anatomy. No obstruction at the anastomosis. Oral contrast passes through the small bowel into the transverse colon through the anastomosis without gross evidence of leak. There is however large volume intraperitoneal free air in the RIGHT upper quadrant. This  suggest perforated bowel or anastomotic breakdown. The transverse and descending colon are normal. Rectum appears normal. Within the central lower RIGHT abdomen large fluid collection measuring 14.5 by 11.6 cm. This collection is homogeneous low simple fluid attenuation cyst in centrally with a thin enhancing rim consistent with peritoneal abscess. (Image 101/2). The collection has a thin arm which extends to the LEFT pericolic gutter. No significant free fluid in the pelvis. Small amount free fluid anterior to the RIGHT hepatic lobe also with a peritoneal enhancement (image 72/2). Vascular/Lymphatic: Abdominal aorta is normal caliber with atherosclerotic calcification. There is no retroperitoneal or periportal lymphadenopathy. No pelvic lymphadenopathy. Reproductive: Prostate normal Other: Large intraperitoneal free air in the RIGHT upper quadrant described in the stomach bowel section. Large peritoneal abscess as described in the bowel section. Musculoskeletal: No aggressive osseous lesion. IMPRESSION: 1. Evidence all bowel perforation/anastomosed breakdown with large volume intraperitoneal free air fluid in the RIGHT upper quadrant adjacent and above the liver. 2. Large loculated fluid collection in the RIGHT anterior pelvis extending along the LEFT pericolic gutter consists with PERITONEAL ABSCESS. 3. Of note, no large volume intraperitoneal free fluid. Oral contrast passed through the RIGHT colon anastomosis without evidence of leak. Findings could indicate a prior anastomotic breakdown which has healed in the interval with subsequent abscess formation from the prior leak / breakdown. Critical Value/emergent results were called by telephone at the time of interpretation on 06/12/2018 at 3:12 pm to Dr. Marylu Lund , who verbally acknowledged these results. Electronically Signed   By: Suzy Bouchard M.D.   On: 06/12/2018 15:12   Ct Abdomen Pelvis W Contrast  Result Date: 06/12/2018 CLINICAL DATA:  RIGHT  colectomy 05/19/2018 for colon adenocarcinoma. Suspicion pneumoperitoneum 1 chest radiograph EXAM: CT CHEST, ABDOMEN, AND PELVIS WITH CONTRAST TECHNIQUE: Multidetector CT imaging of the chest, abdomen and pelvis was performed following the standard protocol during bolus administration of intravenous contrast. CONTRAST:  147mL OMNIPAQUE IOHEXOL 300 MG/ML  SOLN COMPARISON:  Radiograph 06/09/2018, CT 05/18/2018 FINDINGS: CT CHEST FINDINGS Cardiovascular: Post CABG anatomy. No large pulmonary emboli identified. No pericardial effusion. Mediastinum/Nodes: No axillary supraclavicular adenopathy. No mediastinal hilar adenopathy. Esophagus normal. Lungs/Pleura: Moderate sized RIGHT pleural effusion with basilar atelectasis. Mild LEFT basilar atelectasis. No pneumonia. No pneumothorax. No pulmonary edema. Musculoskeletal: Degenerate spurring of the spine. Midline sternotomy. CT ABDOMEN AND PELVIS FINDINGS Hepatobiliary: Extensive extraperitoneal free air position anterior to the liver and lateral to the liver. No focal hepatic lesion. Gallbladder collapsed. Portal veins patent. Pancreas: Pancreas is normal. No ductal dilatation. No pancreatic inflammation. Spleen: Normal spleen Adrenals/urinary tract: Stable nodule of the LEFT adrenal gland measures 2.2 cm. Previously characterized as adenoma. Stomach/Bowel: Stomach, duodenum, small-bowel appear normal. RIGHT hemicolectomy anatomy. No obstruction at the anastomosis. Oral contrast passes through the small bowel into the transverse colon through the anastomosis without gross evidence of leak. There is however large volume intraperitoneal free air in the RIGHT upper quadrant. This suggest perforated bowel or anastomotic breakdown. The transverse and descending colon are normal. Rectum appears normal. Within the central lower RIGHT abdomen large fluid collection measuring 14.5 by 11.6 cm. This collection is homogeneous low simple fluid attenuation cyst in centrally with a thin  enhancing rim  consistent with peritoneal abscess. (Image 101/2). The collection has a thin arm which extends to the LEFT pericolic gutter. No significant free fluid in the pelvis. Small amount free fluid anterior to the RIGHT hepatic lobe also with a peritoneal enhancement (image 72/2). Vascular/Lymphatic: Abdominal aorta is normal caliber with atherosclerotic calcification. There is no retroperitoneal or periportal lymphadenopathy. No pelvic lymphadenopathy. Reproductive: Prostate normal Other: Large intraperitoneal free air in the RIGHT upper quadrant described in the stomach bowel section. Large peritoneal abscess as described in the bowel section. Musculoskeletal: No aggressive osseous lesion. IMPRESSION: 1. Evidence all bowel perforation/anastomosed breakdown with large volume intraperitoneal free air fluid in the RIGHT upper quadrant adjacent and above the liver. 2. Large loculated fluid collection in the RIGHT anterior pelvis extending along the LEFT pericolic gutter consists with PERITONEAL ABSCESS. 3. Of note, no large volume intraperitoneal free fluid. Oral contrast passed through the RIGHT colon anastomosis without evidence of leak. Findings could indicate a prior anastomotic breakdown which has healed in the interval with subsequent abscess formation from the prior leak / breakdown. Critical Value/emergent results were called by telephone at the time of interpretation on 06/12/2018 at 3:12 pm to Dr. Marylu Lund , who verbally acknowledged these results. Electronically Signed   By: Suzy Bouchard M.D.   On: 06/12/2018 15:12   Ct Image Guided Drainage By Percutaneous Catheter  Result Date: 06/13/2018 INDICATION: 84 year old male with a history of right lower quadrant fluid collection. He has been referred for drainage. EXAM: CT-GUIDED DRAINAGE OF RIGHT LOWER QUADRANT FLUID COLLECTION MEDICATIONS: The patient is currently admitted to the hospital and receiving intravenous antibiotics. The antibiotics  were administered within an appropriate time frame prior to the initiation of the procedure. ANESTHESIA/SEDATION: 2.0 mg IV Versed 100 mcg IV Fentanyl Moderate Sedation Time:  22 minutes The patient was continuously monitored during the procedure by the interventional radiology nurse under my direct supervision. COMPLICATIONS: None TECHNIQUE: Informed written consent was obtained from the patient after a thorough discussion of the procedural risks, benefits and alternatives. All questions were addressed. Maximal Sterile Barrier Technique was utilized including caps, mask, sterile gowns, sterile gloves, sterile drape, hand hygiene and skin antiseptic. A timeout was performed prior to the initiation of the procedure. PROCEDURE: The right lower quadrant was prepped with chlorhexidine in a sterile fashion, and a sterile drape was applied covering the operative field. A sterile gown and sterile gloves were used for the procedure. Local anesthesia was provided with 1% Lidocaine. Once the patient is prepped and draped in the usual sterile fashion, 1% lidocaine was used for local anesthesia. Using CT guidance, trocar needle was advanced into the fluid collection of the right lower quadrant. Modified Seldinger technique was used to place a 12 [place] drain. Drain was sutured in position. Approximately 450 cc of murky yellow fluid aspirated sample was sent for culture. Drain was attached to gravity drainage. Patient tolerated the procedure well and remained hemodynamically stable throughout. No complications were encountered and no significant blood loss. FINDINGS: CT again demonstrates fluid collection the right lower quadrant. Status post drain placement, there is significant reduction in fluid after aspiration of 450 cc of fluid. IMPRESSION: Status post drain placement of right lower quadrant fluid collection. Signed, Dulcy Fanny. Dellia Nims, RPVI [facility] Rad[facility]  Electronically Signed   By: Corrie Mckusick D.O.   On: 06/13/2018 17:17    Anti-infectives: Anti-infectives (From admission, onward)   Start     Dose/Rate Route Frequency Ordered Stop  06/10/18 1200  vancomycin (VANCOCIN) 1,500 mg in sodium chloride 0.9 % 500 mL IVPB  Status:  Discontinued     1,500 mg 250 mL/hr over 120 Minutes Intravenous Every 24 hours 06/09/18 1055 06/12/18 0819   06/09/18 1200  ceFEPIme (MAXIPIME) 1 g in sodium chloride 0.9 % 100 mL IVPB     1 g 200 mL/hr over 30 Minutes Intravenous Every 8 hours 06/09/18 1023     06/09/18 1100  vancomycin (VANCOCIN) 2,000 mg in sodium chloride 0.9 % 500 mL IVPB     2,000 mg 250 mL/hr over 120 Minutes Intravenous  Once 06/09/18 1023 06/09/18 1349       Assessment/Plan History of alcohol use Hx of hypertension Hx of hyperlipidemia  Readmit 03/16/44 Acute diastolic CHF exacerbation, HCAP Atrial fibrillation with history of RVR-no anticoagulation secondary to prior bleeding Failure to thrive/deconditioning  Obstructing colon mass/adenocarcinoma 1/8positive nodes             -S/P exploratory laparotomy, right hemicolectomy with primary anastomosis, 05/19/2018, Dr. Fanny Skates   -Intra-abdominal abscess             - CT 9/2 showed 14.5x11.6 cm fluid collection within the central lower RIGHT abdomen             - s/p perc drain 9/3 in IR, cx pending  FEN: HH diet, Ensure ID: merrem 8/30>>, vancomycin 8/30>>9/2 Follow up: Dr. Waynetta Pean, DO - PCP DVT: Lovenox  Plan:Continue BID wet to dry dressing changes to midline abdominal wound. Continue drain and follow culture.   LOS: 9 days    Wellington Hampshire , [facility] 06/14/2018, 8:42 AM Pager: [phone] Consults: [phone]  Agree above.  Drain in place.  Continue to ambulate.  Alphonsa Overall, MD, [facility] Surgery Pager: [phone] Office phone:  [phone]

## 2018-06-14 NOTE — Patient Outreach (Signed)
[facility]) Care Management  06/14/2018  JUDAH CARCHI [birth year] [id number]   CSW will perform a case closure on patient, as patient has been hospitalized for greater than 10 days.  A new referral will be placed for patient once patient is discharged from the hospital, and if social work needs still exist.  CSW will notify patient's Geriatric Nurse Practitioner with [facility], Deloria Lair of CSW's plans to close patient's case.  CSW will fax an update to patient's Primary Care Physician, Dr. [name] to ensure that they are aware of CSW's involvement with patient's plan of care.  Nat Christen, BSW, MSW, LCSW  Licensed Education officer, [facility]  [address] [place] [address] [place], [place] [address] Physical Address-[address], [place], [place] [address] Toll Free Main # [phone] Fax # [phone] Cell # [phone]  Office # [phone] [email or web address]

## 2018-06-14 NOTE — Care Management Note (Signed)
Case Management Note  Patient Details  Name: Vincent Mcclure MRN: [id number] Date of Birth: 1931-12-01  Subjective/Objective:                    Action/Plan:   Expected Discharge Date:                  Expected Discharge Plan:  Skilled Nursing Facility  In-House Referral:  Clinical Social Work  Discharge planning Services  CM Consult  Post Acute Care Choice:  Resumption of Svcs/PTA Provider Choice offered to:  Patient  DME Arranged:  N/A DME Agency:  NA  HH Arranged:    [place] Agency:  [facility] [place]  Status of Service:  Completed, signed off  If discussed at [place] of Stay Meetings, dates discussed:    Additional CommentsPurcell Mouton, RN 06/14/2018, 10:06 AM

## 2018-06-14 NOTE — Progress Notes (Signed)
PROGRESS NOTE  Vincent Mcclure MOQ:[id number] DOB: [birth year] DOA: 06/02/2018 PCP: [name], DO  HPI/Recap of past 1 hours: 84 year old male with medical history significant for CAD, colon adenocarcinoma, history of alcohol abuse, A. fib, hypertension,status post ex lap and right colectomy with primary anastomosis on 8/9 due to adenocarcinoma. Patient presented to the surgical clinic for postop eval where he was found to be weak, short of breath and congested, subsequently sent to the emergency department where he was evaluated by EDP and surgical team and was admitted with working diagnosis of suspected CHF and pneumoperitoneum.  Today, pt denied any new complaints. Still sounded congested clinically. Denies any chest pain, worsening SOB, abdominal pain, fever/chills.  Assessment/Plan: Active Problems:   HLD (hyperlipidemia)   HTN (hypertension)   GERD   h/o Anemia due to blood loss, acute   Alcohol use/ h/o heavy abuse   s/p NSTEMI (non-ST elevated myocardial infarction); with subsequent urgent CABG  5/18 ([place])   PAF (paroxysmal atrial fibrillation) ([name])   Cancer of ascending colon [id number] s/p right colectomy 05/19/2018   Congestive heart failure (CHF) ([name])   Hypokalemia   Atrial fibrillation with RVR ([name])   Hypoalbuminemia   Subacute confusional state   CHF (congestive heart failure) ([name])  Acute respiratory failure with hypoxia due to CHF exacerbation, HCAP with sepsis not present on admission Afebrile, with no leukocytosis Still requiring O2 [place] 3L, sounds congested CT chest showed a moderate sized R pleural effusion, no PNA,, pneumothorax or pulm edema Will repeat a CXR in am to re-evaluate the R sided pleural effusion Continue on scheduled lasix Continue IV cefepime Continue supplemental O2  Peritoneal abscess CT showed Large loculated fluid collection in R anterior pelvis extending along L pericolic gutter worrisome for peritoneal abscess IR on board, s/p RLQ  drain placement on 06/13/18 (450cc of murky fluid aspirated) Fluid culture, NGTD General surgery on board Continue cefepime  Pneumoperitoneum Felt to be related to recent colectomy on 05/19/2018 for adenoCA Again noted on CXR on 8/30 General Surgery on board  Acute on chronic diastolic CHF 2d echo with findings of EF of 55%, with mild pulmonary hypertension and grade 1 diastolic dysfunction.  Continues to tolerate diuresis  Chronic A. fib Rate controlled, chads Vasc score 5 noted Patient currently not on anticoagulation prior to admission due to GI bleed, pt also strongly declined and understands the risk of CVA in the future Continue ASA, metoprolol  Normocytic anemia of chronic disease 2/2 Ca colon Baseline 10-11 Continue iron supplementation Daily CBC  Alcohol abuse No evidence of ETOH withdrawals thus far Recommend to quit    Code Status: Full  Family Communication: None at bedside  Disposition Plan: SNF   Consultants:  General surgery  IR  Procedures:  Status post drain cement on 06/13/2018  Antimicrobials:  Cefepime  DVT prophylaxis: Lovenox   Objective: Vitals:   06/14/18 0504 06/14/18 1242 06/14/18 1344 06/14/18 1345  BP:  (!) 99/55    Pulse:  68    Resp:  20    Temp:  98.2 F (36.8 C)    TempSrc:  Oral    SpO2:  98% 96% 96%  Weight: 97.2 kg     Height:        Intake/Output Summary (Last 24 hours) at 06/14/2018 1733 Last data filed at 06/14/2018 1534 Gross per 24 hour  Intake 1922.16 ml  Output 1525 ml  Net 397.16 ml   Filed Weights   06/12/18 0432 06/13/18 0400 06/14/18 0504  Weight: 98.2 kg 101.1 kg 97.2 kg    Exam:   General: NAD  Cardiovascular: S1, S2 present  Respiratory: Coarse breath sounds bilaterally  Abdomen: Soft, mild TTP around drain, nondistended, bowel sounds present.  RUQ drain noted with sanguinous fluid in pouch  Musculoskeletal: No pedal edema bilaterally  Skin: Normal  Psychiatry: Normal  mood   Data Reviewed: CBC: Recent Labs  Lab 06/09/18 0420 06/10/18 0351 06/11/18 0504 06/13/18 0438 06/14/18 0451  WBC 11.4* 11.0* 10.7* 11.6* 9.7  HGB 10.8* 10.1* 10.2* 10.5* 9.6*  HCT 35.1* 32.7* 34.0* 34.8* 31.1*  MCV 90.7 90.3 92.6 92.3 90.1  PLT 398 341 332 302 832   Basic Metabolic Panel: Recent Labs  Lab 06/08/18 0421 06/09/18 0420 06/10/18 0351 06/11/18 0504 06/12/18 0436 06/13/18 0438 06/14/18 0451  NA 141 140 139 141 141 140 141  K 3.7 3.9 4.2 4.5 4.3 5.1 4.0  CL 98 95* 96* 100 103 101 101  CO2 37* 35* 32 31 32 30 33*  GLUCOSE 123* 155* 117* 110* 105* 119* 120*  BUN 14 15 18 19 19 19 19   CREATININE 1.00 1.03 0.93 0.97 1.02 1.06 0.87  CALCIUM 8.7* 8.5* 8.3* 8.6* 8.2* 8.9 8.7*  MG 2.0 1.8  --   --   --   --   --    GFR: Estimated Creatinine Clearance: 75.2 mL/min (by C-G formula based on SCr of 0.87 mg/dL). Liver Function Tests: Recent Labs  Lab 06/13/18 0438 06/14/18 0451  AST 29 20  ALT 33 26  ALKPHOS 90 79  BILITOT 0.7 0.4  PROT 5.3* 5.2*  ALBUMIN 2.2* 1.9*   No results for input(s): LIPASE, AMYLASE in the last 168 hours. No results for input(s): AMMONIA in the last 168 hours. Coagulation Profile: Recent Labs  Lab 06/13/18 0957  INR 1.20   Cardiac Enzymes: No results for input(s): CKTOTAL, CKMB, CKMBINDEX, TROPONINI in the last 168 hours. BNP (last 3 results) No results for input(s): PROBNP in the last 8760 hours. HbA1C: No results for input(s): HGBA1C in the last 72 hours. CBG: No results for input(s): GLUCAP in the last 168 hours. Lipid Profile: No results for input(s): CHOL, HDL, LDLCALC, TRIG, CHOLHDL, LDLDIRECT in the last 72 hours. Thyroid Function Tests: No results for input(s): TSH, T4TOTAL, FREET4, T3FREE, THYROIDAB in the last 72 hours. Anemia Panel: No results for input(s): VITAMINB12, FOLATE, FERRITIN, TIBC, IRON, RETICCTPCT in the last 72 hours. Urine analysis:    Component Value Date/Time   COLORURINE YELLOW  06/03/2018 1005   APPEARANCEUR CLEAR 06/03/2018 1005   LABSPEC 1.010 06/03/2018 1005   PHURINE 6.5 06/03/2018 1005   GLUCOSEU NEGATIVE 06/03/2018 1005   GLUCOSEU NEGATIVE 08/11/2016 1521   HGBUR TRACE (A) 06/03/2018 1005   BILIRUBINUR NEGATIVE 06/03/2018 1005   KETONESUR NEGATIVE 06/03/2018 1005   PROTEINUR NEGATIVE 06/03/2018 1005   UROBILINOGEN 1.0 08/11/2016 1521   NITRITE NEGATIVE 06/03/2018 1005   LEUKOCYTESUR TRACE (A) 06/03/2018 1005   Sepsis Labs: @LABRCNTIP (procalcitonin:4,lacticidven:4)  ) Recent Results (from the past 240 hour(s))  Culture, blood (x 2)     Status: None   Collection Time: 06/09/18 11:01 AM  Result Value Ref Range Status   Specimen Description   Final    BLOOD RIGHT ANTECUBITAL Performed at [facility] [address] [place], [place] [address]    Special Requests   Final    BOTTLES DRAWN AEROBIC AND ANAEROBIC Blood Culture adequate volume Performed at [facility]  [place], [place] [address]    Culture   Final    NO GROWTH 5 DAYS Performed at [facility], [place] [address], [place], [place] [address]    Report Status 06/14/2018 FINAL  Final  Culture, blood (x 2)     Status: None   Collection Time: 06/09/18 11:06 AM  Result Value Ref Range Status   Specimen Description   Final    BLOOD RIGHT FOREARM Performed at [facility] [address] [place], [place] [address]    Special Requests   Final    BOTTLES DRAWN AEROBIC AND ANAEROBIC Blood Culture adequate volume Performed at [facility] [address], [place], [place] [address]    Culture   Final    NO GROWTH 5 DAYS Performed at [facility], [place] [address] [place], [place] [address]    Report Status 06/14/2018 FINAL  Final  MRSA PCR Screening     Status: None   Collection Time: 06/11/18  1:48 PM  Result Value Ref Range Status   MRSA by PCR NEGATIVE NEGATIVE Final    Comment:         The GeneXpert MRSA Assay (FDA approved for NASAL specimens only), is one component of a comprehensive MRSA colonization surveillance program. It is not intended to diagnose MRSA infection nor to guide or monitor treatment for MRSA infections. Performed at [facility], [place] [address], [place], [place] [address]   Aerobic/Anaerobic Culture (surgical/deep wound)     Status: None (Preliminary result)   Collection Time: 06/13/18  5:10 PM  Result Value Ref Range Status   Specimen Description   Final    ABDOMEN Performed at [facility] [address], [place], [place] [address]    Special Requests   Final    NONE Performed at [facility], [place] [address], [place], [place] [address]    Gram Stain   Final    ABUNDANT WBC PRESENT,BOTH PMN AND MONONUCLEAR FEW GRAM NEGATIVE RODS    Culture   Final    NO GROWTH < 24 HOURS Performed at [facility], [place] [address], [place], [place] [address]    Report Status PENDING  Incomplete      Studies: No results found.  Scheduled Meds: . aspirin EC  81 mg Oral Daily  . dextromethorphan-guaiFENesin  1 tablet Oral BID  . enoxaparin (LOVENOX) injection  40 mg Subcutaneous Q24H  . feeding supplement (ENSURE ENLIVE)  237 mL Oral Q24H  . furosemide  40 mg Intravenous Daily  . gabapentin  100 mg Oral BID  . metoprolol tartrate  75 mg Oral BID  . multivitamins with iron  1 tablet Oral Daily  . pantoprazole  40 mg Oral Daily  . polyethylene glycol  17 g Oral Daily  . pravastatin  40 mg Oral QHS  . sodium chloride flush  5 mL Intracatheter Q8H    Continuous Infusions: . sodium chloride 10 mL/hr at 06/12/18 0449  . sodium chloride 10 mL/hr at 06/14/18 0400  . ceFEPime (MAXIPIME) IV 1 g (06/14/18 1121)     LOS: 9 days     [name] Friendly, MD [facility]   If 7PM-7AM, please contact night-coverage [email or web address] Password [facility] 06/14/2018, 5:33 PM

## 2018-06-15 ENCOUNTER — Ambulatory Visit: Payer: Self-pay | Admitting: *Deleted

## 2018-06-15 ENCOUNTER — Inpatient Hospital Stay (HOSPITAL_COMMUNITY): Payer: Medicare HMO

## 2018-06-15 LAB — BASIC METABOLIC PANEL
Anion gap: 7 (ref 5–15)
BUN: 19 mg/dL (ref 8–23)
CHLORIDE: 101 mmol/L (ref 98–111)
CO2: 33 mmol/L — ABNORMAL HIGH (ref 22–32)
Calcium: 8.6 mg/dL — ABNORMAL LOW (ref 8.9–10.3)
Creatinine, Ser: 0.91 mg/dL (ref 0.61–1.24)
GFR calc Af Amer: >60 mL/min (ref >60)
GFR calc non Af Amer: >60 mL/min (ref >60)
GLUCOSE: 124 mg/dL — AB (ref 70–99)
POTASSIUM: 3.9 mmol/L (ref 3.5–5.1)
Sodium: 141 mmol/L (ref 135–145)

## 2018-06-15 LAB — CBC WITH DIFFERENTIAL/PLATELET
Basophils Absolute: 0 10*3/uL (ref 0.0–0.1)
Basophils Relative: 0 %
Eosinophils Absolute: 0.1 10*3/uL (ref 0.0–0.7)
Eosinophils Relative: 2 %
HEMATOCRIT: 33.1 % — AB (ref 39.0–52.0)
HEMOGLOBIN: 10.2 g/dL — AB (ref 13.0–17.0)
LYMPHS ABS: 0.8 10*3/uL (ref 0.7–4.0)
LYMPHS PCT: 9 %
MCH: 27.7 pg (ref 26.0–34.0)
MCHC: 30.8 g/dL (ref 30.0–36.0)
MCV: 89.9 fL (ref 78.0–100.0)
Monocytes Absolute: 0.6 10*3/uL (ref 0.1–1.0)
Monocytes Relative: 7 %
NEUTROS PCT: 82 %
Neutro Abs: 6.9 10*3/uL (ref 1.7–7.7)
Platelets: 304 10*3/uL (ref 150–400)
RBC: 3.68 MIL/uL — AB (ref 4.22–5.81)
RDW: 14.6 % (ref 11.5–15.5)
WBC: 8.4 10*3/uL (ref 4.0–10.5)

## 2018-06-15 MED ORDER — IPRATROPIUM-ALBUTEROL 0.5-2.5 (3) MG/3ML IN SOLN
3.0000 mL | Freq: Four times a day (QID) | RESPIRATORY_TRACT | Status: DC
  Administered 2018-06-15 – 2018-06-17 (×9): 3 mL via RESPIRATORY_TRACT
  Filled 2018-06-15 (×10): qty 3

## 2018-06-15 MED ORDER — BUDESONIDE 0.25 MG/2ML IN SUSP
0.2500 mg | Freq: Two times a day (BID) | RESPIRATORY_TRACT | Status: DC
  Administered 2018-06-15 – 2018-06-22 (×13): 0.25 mg via RESPIRATORY_TRACT
  Filled 2018-06-15 (×14): qty 2

## 2018-06-15 NOTE — Progress Notes (Signed)
PROGRESS NOTE  Vincent Mcclure EXH:[id number] DOB: 1932-08-17 DOA: 06/02/2018 PCP: Vincent Dance, DO  HPI/Recap of past 77 hours: 84 year old male with medical history significant for CAD, colon adenocarcinoma, history of alcohol abuse, A. fib, hypertension,status post ex lap and right colectomy with primary anastomosis on 8/9 due to adenocarcinoma. Patient presented to the surgical clinic for postop eval where he was found to be weak, short of breath and congested, subsequently sent to the emergency department where he was evaluated by EDP and surgical team and was admitted with working diagnosis of suspected CHF and pneumoperitoneum.  Today, met pt awake, but seemed confused, was very talkative and wasn't really making sense. Pt had just received oxycodone and wife stated he never takes any strong opioid. Denied any new complaints, no chest pain, no worsening SOB, abdominal pain, fever/chills.  Assessment/Plan: Active Problems:   HLD (hyperlipidemia)   HTN (hypertension)   GERD   h/o Anemia due to blood loss, acute   Alcohol use/ h/o heavy abuse   s/p NSTEMI (non-ST elevated myocardial infarction); with subsequent urgent CABG  5/18 ([place])   PAF (paroxysmal atrial fibrillation) ([name])   Cancer of ascending colon pT4apN1 s/p right colectomy 05/19/2018   Congestive heart failure (CHF) ([name])   Hypokalemia   Atrial fibrillation with RVR ([name])   Hypoalbuminemia   Subacute confusional state   CHF (congestive heart failure) ([name])  Acute respiratory failure with hypoxia/hypercabia due to CHF exacerbation, HCAP with sepsis not present on admission Vs underlying COPD (past smoker, 62 pack year, quit about 9 yrs ago)  Afebrile, with no leukocytosis Still requiring O2 [place] 2L ABG showed pCO2 of 66.7, pO2 of 73.6, will follow closely, may need bipap if worsens (currently no increased WOB). May be worsened by recent narcotic use  CT chest showed a moderate sized R pleural effusion, no PNA,,  pneumothorax or pulm edema Repeat CXR with no acute cardiopulmonary abnormality Continue on scheduled lasix Continue IV cefepime Scheduled duonebs, pulmicort Continue supplemental O2, may need bipap if increased WOB is noted  Peritoneal abscess CT showed Large loculated fluid collection in R anterior pelvis extending along L pericolic gutter worrisome for peritoneal abscess IR on board, s/p RLQ drain placement on 06/13/18 (450cc of murky fluid aspirated) Fluid culture, NGTD General surgery on board Continue cefepime  Pneumoperitoneum Felt to be related to recent colectomy on 05/19/2018 for adenoCA Again noted on CXR on 8/30 General Surgery on board  Acute on chronic diastolic CHF 2d echo with findings of EF of 55%, with mild pulmonary hypertension and grade 1 diastolic dysfunction.  Continues to tolerate diuresis  Chronic A. fib Rate controlled, chads Vasc score 5 noted Patient currently not on anticoagulation prior to admission due to GI bleed, pt also strongly declined and understands the risk of CVA in the future Continue ASA, metoprolol  Normocytic anemia of chronic disease 2/2 Ca colon Baseline 10-11 Continue iron supplementation Daily CBC  Alcohol abuse No evidence of ETOH withdrawals thus far Recommend to quit    Code Status: Full  Family Communication: None at bedside  Disposition Plan: SNF   Consultants:  General surgery  IR  Procedures:  Status post drain cement on 06/13/2018  Antimicrobials:  Cefepime  DVT prophylaxis: Lovenox   Objective: Vitals:   06/14/18 2109 06/15/18 0450 06/15/18 0454 06/15/18 1433  BP: 100/66 117/63  115/77  Pulse: 80 77  90  Resp: (!) 21 17  20   Temp: 98.4 F (36.9 C) 97.7 F (36.5 C)  98.3 F (36.8 C)  TempSrc: Oral Oral  Oral  SpO2: 98% 99%  (!) 88%  Weight:   97.6 kg   Height:        Intake/Output Summary (Last 24 hours) at 06/15/2018 1446 Last data filed at 06/15/2018 1437 Gross per 24 hour  Intake  1100.81 ml  Output 1286 ml  Net -185.19 ml   Filed Weights   06/13/18 0400 06/14/18 0504 06/15/18 0454  Weight: 101.1 kg 97.2 kg 97.6 kg    Exam:   General: NAD  Cardiovascular: S1, S2 present  Respiratory: Coarse breath sounds bilaterally  Abdomen: Soft, mild TTP around drain, nondistended, bowel sounds present.  RUQ drain noted  Musculoskeletal: No pedal edema bilaterally  Skin: Normal  Psychiatry: Normal mood   Data Reviewed: CBC: Recent Labs  Lab 06/10/18 0351 06/11/18 0504 06/13/18 0438 06/14/18 0451 06/15/18 0443  WBC 11.0* 10.7* 11.6* 9.7 8.4  NEUTROABS  --   --   --   --  6.9  HGB 10.1* 10.2* 10.5* 9.6* 10.2*  HCT 32.7* 34.0* 34.8* 31.1* 33.1*  MCV 90.3 92.6 92.3 90.1 89.9  PLT 341 332 302 290 003   Basic Metabolic Panel: Recent Labs  Lab 06/09/18 0420  06/11/18 0504 06/12/18 0436 06/13/18 0438 06/14/18 0451 06/15/18 0443  NA 140   < > 141 141 140 141 141  K 3.9   < > 4.5 4.3 5.1 4.0 3.9  CL 95*   < > 100 103 101 101 101  CO2 35*   < > 31 32 30 33* 33*  GLUCOSE 155*   < > 110* 105* 119* 120* 124*  BUN 15   < > 19 19 19 19 19   CREATININE 1.03   < > 0.97 1.02 1.06 0.87 0.91  CALCIUM 8.5*   < > 8.6* 8.2* 8.9 8.7* 8.6*  MG 1.8  --   --   --   --   --   --    < > = values in this interval not displayed.   GFR: Estimated Creatinine Clearance: 72 mL/min (by C-G formula based on SCr of 0.91 mg/dL). Liver Function Tests: Recent Labs  Lab 06/13/18 0438 06/14/18 0451  AST 29 20  ALT 33 26  ALKPHOS 90 79  BILITOT 0.7 0.4  PROT 5.3* 5.2*  ALBUMIN 2.2* 1.9*   No results for input(s): LIPASE, AMYLASE in the last 168 hours. No results for input(s): AMMONIA in the last 168 hours. Coagulation Profile: Recent Labs  Lab 06/13/18 0957  INR 1.20   Cardiac Enzymes: No results for input(s): CKTOTAL, CKMB, CKMBINDEX, TROPONINI in the last 168 hours. BNP (last 3 results) No results for input(s): PROBNP in the last 8760 hours. HbA1C: No results  for input(s): HGBA1C in the last 72 hours. CBG: Recent Labs  Lab 06/14/18 2114  GLUCAP 140*   Lipid Profile: No results for input(s): CHOL, HDL, LDLCALC, TRIG, CHOLHDL, LDLDIRECT in the last 72 hours. Thyroid Function Tests: No results for input(s): TSH, T4TOTAL, FREET4, T3FREE, THYROIDAB in the last 72 hours. Anemia Panel: No results for input(s): VITAMINB12, FOLATE, FERRITIN, TIBC, IRON, RETICCTPCT in the last 72 hours. Urine analysis:    Component Value Date/Time   COLORURINE YELLOW 06/03/2018 1005   APPEARANCEUR CLEAR 06/03/2018 1005   LABSPEC 1.010 06/03/2018 1005   PHURINE 6.5 06/03/2018 1005   GLUCOSEU NEGATIVE 06/03/2018 1005   GLUCOSEU NEGATIVE 08/11/2016 1521   HGBUR TRACE (A) 06/03/2018 [place] 06/03/2018 1005  KETONESUR NEGATIVE 06/03/2018 1005   PROTEINUR NEGATIVE 06/03/2018 1005   UROBILINOGEN 1.0 08/11/2016 1521   NITRITE NEGATIVE 06/03/2018 1005   LEUKOCYTESUR TRACE (A) 06/03/2018 1005   Sepsis Labs: @LABRCNTIP (procalcitonin:4,lacticidven:4)  ) Recent Results (from the past 240 hour(s))  Culture, blood (x 2)     Status: None   Collection Time: 06/09/18 11:01 AM  Result Value Ref Range Status   Specimen Description   Final    BLOOD RIGHT ANTECUBITAL Performed at [facility], [place] [address] [place], [place] [address]    Special Requests   Final    BOTTLES DRAWN AEROBIC AND ANAEROBIC Blood Culture adequate volume Performed at [facility] [address], [place], [place] [address]    Culture   Final    NO GROWTH 5 DAYS Performed at [facility], [place] [address] [place], [place] [address]    Report Status 06/14/2018 FINAL  Final  Culture, blood (x 2)     Status: None   Collection Time: 06/09/18 11:06 AM  Result Value Ref Range Status   Specimen Description   Final    BLOOD RIGHT FOREARM Performed at [facility] [address] [place], [place] [address]     Special Requests   Final    BOTTLES DRAWN AEROBIC AND ANAEROBIC Blood Culture adequate volume Performed at [facility] [address], [place], [place] [address]    Culture   Final    NO GROWTH 5 DAYS Performed at [facility], [place] [address] [place], [place] [address]    Report Status 06/14/2018 FINAL  Final  MRSA PCR Screening     Status: None   Collection Time: 06/11/18  1:48 PM  Result Value Ref Range Status   MRSA by PCR NEGATIVE NEGATIVE Final    Comment:        The GeneXpert MRSA Assay (FDA approved for NASAL specimens only), is one component of a comprehensive MRSA colonization surveillance program. It is not intended to diagnose MRSA infection nor to guide or monitor treatment for MRSA infections. Performed at [facility], [place] [address], [place], [place] [address]   Aerobic/Anaerobic Culture (surgical/deep wound)     Status: None (Preliminary result)   Collection Time: 06/13/18  5:10 PM  Result Value Ref Range Status   Specimen Description   Final    ABDOMEN Performed at [facility] [address], [place], [place] [address]    Special Requests   Final    NONE Performed at [facility], [place] [address] [place], [place] [address]    Gram Stain   Final    ABUNDANT WBC PRESENT,BOTH PMN AND MONONUCLEAR FEW GRAM NEGATIVE RODS    Culture   Final    NO GROWTH 2 DAYS HOLDING FOR POSSIBLE ANAEROBE Performed at [facility], [place] [address], [place],  [address]    Report Status PENDING  Incomplete      Studies: Dg Chest Port 1 View  Result Date: 06/15/2018 CLINICAL DATA:  Dyspnea EXAM: PORTABLE CHEST 1 VIEW COMPARISON:  Chest x-rays dated 06/09/2018 and 05/22/2018. FINDINGS: Heart size and mediastinal contours are stable. Lungs are clear. No pleural effusion or pneumothorax seen. Diminished free intraperitoneal air under the RIGHT hemidiaphragm IMPRESSION: 1. No  evidence of acute cardiopulmonary abnormality. No evidence of pneumonia or pulmonary edema. 2. Decreased free intraperitoneal air under the RIGHT hemidiaphragm. Electronically Signed   By: Cherlynn Kaiser  Enriqueta Shutter M.D.   On: 06/15/2018 10:18    Scheduled Meds: . aspirin EC  81 mg Oral Daily  . dextromethorphan-guaiFENesin  1 tablet Oral BID  . enoxaparin (LOVENOX) injection  40 mg Subcutaneous Q24H  . feeding supplement (ENSURE ENLIVE)  237 mL Oral Q24H  . furosemide  40 mg Intravenous Daily  . gabapentin  100 mg Oral BID  . metoprolol tartrate  75 mg Oral BID  . multivitamins with iron  1 tablet Oral Daily  . pantoprazole  40 mg Oral Daily  . polyethylene glycol  17 g Oral Daily  . pravastatin  40 mg Oral QHS  . sodium chloride flush  5 mL Intracatheter Q8H    Continuous Infusions: . sodium chloride 10 mL/hr at 06/12/18 0449  . sodium chloride 10 mL/hr at 06/14/18 0400  . ceFEPime (MAXIPIME) IV 1 g (06/15/18 1228)     LOS: 10 days     [name] Friendly, MD [facility]   If 7PM-7AM, please contact night-coverage [email or web address] Password [email or web address] 06/15/2018, 2:46 PM

## 2018-06-15 NOTE — Progress Notes (Signed)
Pharmacy Antibiotic Note  Vincent Mcclure is a 84 y.o. male with right colonic mass s/p right hemicolectomy with primary anastomosis on 05/19/18 and was discharged from [facility] on 05/30/[facility].  She was sent to the ED from [name] office on 06/02/2018 for workup and treatment of suspected PNA.  Abd pelvis CT on 9/2 showed "bowel perforation/anastomosed breakdown with large volume intraperitoneal free air fluid in the RIGHT upper quadrant adjacent and above the liver. Large loculated fluid collection in the RIGHT anterior pelvis extending along the LEFT pericolic gutter consists with PERITONEAL ABSCESS."  He had ELQ drain placed on 9/3.  She's currently on cefepime for PNA and abscess.  Today, 06/15/2018: - day #7 of abx - Afebrile. WBC wnl, scr ok - abd abscess cx with "few GNR"  Plan: - continue cefepime 1 gm IV q8h - f/u cultures and LOT for abx  ___________________________  Height: 5\' 11"  (180.3 cm) Weight: 215 lb 2.7 oz (97.6 kg) IBW/kg (Calculated) : 75.3  Temp (24hrs), Avg:98.1 F (36.7 C), Min:97.7 F (36.5 C), Max:98.4 F (36.9 C)  Recent Labs  Lab 06/10/18 0351 06/11/18 0504 06/12/18 0436 06/13/18 0438 06/14/18 0451 06/15/18 0443  WBC 11.0* 10.7*  --  11.6* 9.7 8.4  CREATININE 0.93 0.97 1.02 1.06 0.87 0.91    Estimated Creatinine Clearance: 72 mL/min (by C-G formula based on SCr of 0.91 mg/dL).    Allergies  Allergen Reactions  . No Known Allergies    Antimicrobials this admission: 8/30 Vanc >> 9/2 8/30 Cefepime >>  Microbiology results: 8/23 blood: ngf 8/24 urine: ngf 8/30 BCx: ngf 9/1 MRSA PCR: negative 9/3 abd abscess: few GNR  Thank you for allowing pharmacy to be a part of this patient's care.  Lynelle Doctor 06/15/2018 10:54 AM

## 2018-06-15 NOTE — Progress Notes (Signed)
Physical Therapy Treatment Patient Details Name: Vincent Mcclure MRN: [id number] DOB: 06/16/1932 Today's Date: 06/15/2018    History of Present Illness 84 y.o. male with medical history significant of CAD s/p CABG in 2018, PAF not on anticoagulation due to h/o GIB in past, and s/p exp lap with colectomy 05/19/18.  He was admitted from his follow up surgical appt due to weakness and congestion.     PT Comments    Pt was difficult to arouse with slurred speech, increased confusion and difficult to maintain eye focus.  Assisted to EOB required increased assist and c/o dizziness.  Pt present with sitting balance instability and difficulty supporting self.  Spouse in room at time.  RN called to room.  Reported change in condition.  Pt initially confused, slurred speech and glazed eyes.  Pt on 4 lts O2 stats ranged from low 80's to low 90's.  Noted increased WOB.  Pt sat EOB x 8 min during RN assessment.  With NT assisted to standing for peri clean, quick bed change and a few steps to [facility].  Pt was unable to amb due to increased instability and decreased cognition.  Pt appears "drunk" meds?   Increased alertness throughout session but still "dazed and confused".  Returned to bed, spouse and RN at bedside.   Follow Up Recommendations  SNF     Equipment Recommendations  None recommended by PT    Recommendations for Other Services       Precautions / Restrictions Precautions Precautions: Fall Precaution Comments: watch HR and sats Restrictions Weight Bearing Restrictions: No    Mobility  Bed Mobility Overal bed mobility: Needs Assistance Bed Mobility: Supine to Sit;Sit to Supine     Supine to sit: Max assist;Total assist;+2 for physical assistance;+2 for safety/equipment Sit to supine: Max assist;+2 for physical assistance;+2 for safety/equipment   General bed mobility comments: pt required increased assist present with AMS, increased lethargy, inability to support self sitting.  RN called  to room.  Transfers Overall transfer level: Needs assistance   Transfers: Sit to/from Stand           General transfer comment: + 2 Max Assist long enough for perform peri care, change bed and take a few side steps to [facility].   Ambulation/Gait             General Gait Details: unable to due increased lethargic state   Stairs             Wheelchair Mobility    Modified Rankin (Stroke Patients Only)       Balance                                            Cognition Arousal/Alertness: Lethargic                                     General Comments: difficult to arouse, groggy, slurred speech, eyes glazed      Exercises      General Comments        Pertinent Vitals/Pain Pain Assessment: Faces Faces Pain Scale: Hurts a little bit Pain Location: ABD near drain site Pain Descriptors / Indicators: Grimacing Pain Intervention(s): Monitored during session    Home Living  Prior Function            PT Goals (current goals can now be found in the care plan section) Progress towards PT goals: Progressing toward goals    Frequency    Min 3X/week      PT Plan Current plan remains appropriate    Co-evaluation              AM-PAC PT "6 Clicks" Daily Activity  Outcome Measure  Difficulty turning over in bed (including adjusting bedclothes, sheets and blankets)?: A Lot Difficulty moving from lying on back to sitting on the side of the bed? : A Lot Difficulty sitting down on and standing up from a chair with arms (e.g., wheelchair, bedside commode, etc,.)?: A Lot Help needed moving to and from a bed to chair (including a wheelchair)?: A Lot Help needed walking in hospital room?: A Lot Help needed climbing 3-5 steps with a railing? : Total 6 Click Score: 11    End of Session Equipment Utilized During Treatment: Gait belt;Oxygen Activity Tolerance: Other  (comment)(lethargic/groggy) Patient left: in bed Nurse Communication: Other (comment)(lethargic/groggy) PT Visit Diagnosis: Unsteadiness on feet (R26.81);Muscle weakness (generalized) (M62.81);History of falling (Z91.81);Difficulty in walking, not elsewhere classified (R26.2)     Time: 0272-5366 PT Time Calculation (min) (ACUTE ONLY): 32 min  Charges:  $Therapeutic Activity: 23-37 mins                     Rica Koyanagi  PTA [facility]  Acute  Rehab Pager      [phone]

## 2018-06-15 NOTE — Progress Notes (Signed)
Nutrition Follow-up  DOCUMENTATION CODES:   Obesity unspecified  INTERVENTION:    Continue Magic cup TID (orange only) with meals, each supplement provides 290 kcal and 9 grams of protein  Continue Ensure Enlive po once daily, each supplement provides 350 kcal and 20 grams of protein  NUTRITION DIAGNOSIS:   Increased nutrient needs related to chronic illness, cancer and cancer related treatments as evidenced by estimated needs.  Ongoing  GOAL:   Patient will meet greater than or equal to 90% of their needs  Progressing  MONITOR:   PO intake, Labs, Weight trends, I & O's  REASON FOR ASSESSMENT:   Consult Enteral/tube feeding initiation and management  ASSESSMENT:   84 y.o. male with medical history significant of CAD, Colon Cancer, close intolerance, GI bleed with melena, iron deficiency anemia, history of alcohol abuse, Afib,  HTN,  Admitted for CHF exacerbation   9/2-CT 9/2 showed 14.5x11.6 cmfluid collection within the central lower RIGHT abdomen 9/3- right percutaneous drain placed   Pt admits appetite has been great this week. Meal completions charted as 40-100% for his last 6 meals. He consumed corn flakes and magic cup this morning without complication. Denies any increased abdominal pain, nausea, or vomiting. He does not wish to have vanilla Magic cup only Orange. RD to change order. Ensure to stay the same.   Weight noted to increase 5 lb since last RD visit on 8/30 (210 lb to 215 lb).   Drain output: 260 ml x 24 hrs   Medications reviewed and include: 40 mg lasix once daily, MVI with iron Labs reviewed.   Diet Order:   Diet Order            Diet Heart Room service appropriate? Yes; Fluid consistency: Thin  Diet effective now              EDUCATION NEEDS:   No education needs have been identified at this time  Skin:  Skin Assessment: Reviewed RN Assessment  Last BM:  06/14/18  Height:   Ht Readings from Last 1 Encounters:  06/03/18 5\' 11"   (1.803 m)    Weight:   Wt Readings from Last 1 Encounters:  06/15/18 97.6 kg    Ideal Body Weight:  78.2 kg  BMI:  Body mass index is 30.01 kg/m.  Estimated Nutritional Needs:   Kcal:  2100-2300  Protein:  95-105g  Fluid:  1500 ml fluid restriction   Mariana Single RD, LDN Clinical Nutrition Pager # - [phone]

## 2018-06-15 NOTE — Progress Notes (Addendum)
[facility] Progress Note     Subjective: CC-  Just finished breakfast, taking a nap.  No specific complaints but states that he's just feeling a little off today. Denies any increased abdominal pain, nausea, or vomiting. Tolerating diet. He had 3 BMs yesterday. IR drain put out 260cc last 24 hours; output is serous.  Objective: Vital signs in last 24 hours: Temp:  [97.7 F (36.5 C)-98.4 F (36.9 C)] 97.7 F (36.5 C) (09/05 0450) Pulse Rate:  [68-80] 77 (09/05 0450) Resp:  [17-21] 17 (09/05 0450) BP: (99-117)/(55-66) 117/63 (09/05 0450) SpO2:  [96 %-99 %] 99 % (09/05 0450) Weight:  [97.6 kg] 97.6 kg (09/05 0454) Last BM Date: 06/14/18  Intake/Output from previous day: 09/04 0701 - 09/05 0700 In: 1225.8 [P.O.:480; I.V.:35.8; IV Piggyback:700] Out: 5638 [Urine:1325; Drains:260; Stool:1] Intake/Output this shift: No intake/output data recorded.  PE: Gen: Alert, NAD, pleasant Pulm:effort normal Skin: no rashes noted, warm and dry Abd: Soft,nontender, +BS, midline incision healed except for 3x3x1cm area at distal aspect, trace slough noted at base, wound tunnels proximally  Lab Results:  Recent Labs    06/14/18 0451 06/15/18 0443  WBC 9.7 8.4  HGB 9.6* 10.2*  HCT 31.1* 33.1*  PLT 290 304   BMET Recent Labs    06/14/18 0451 06/15/18 0443  NA 141 141  K 4.0 3.9  CL 101 101  CO2 33* 33*  GLUCOSE 120* 124*  BUN 19 19  CREATININE 0.87 0.91  CALCIUM 8.7* 8.6*   PT/INR Recent Labs    06/13/18 0957  LABPROT 15.1  INR 1.20   CMP     Component Value Date/Time   NA 141 06/15/2018 0443   NA 139 01/17/2018 1401   K 3.9 06/15/2018 0443   CL 101 06/15/2018 0443   CO2 33 (H) 06/15/2018 0443   GLUCOSE 124 (H) 06/15/2018 0443   BUN 19 06/15/2018 0443   BUN 13 01/17/2018 1401   CREATININE 0.91 06/15/2018 0443   CALCIUM 8.6 (L) 06/15/2018 0443   PROT 5.2 (L) 06/14/2018 0451   PROT 6.4 01/17/2018 1401   ALBUMIN 1.9 (L) 06/14/2018 0451    ALBUMIN 4.2 01/17/2018 1401   AST 20 06/14/2018 0451   ALT 26 06/14/2018 0451   ALKPHOS 79 06/14/2018 0451   BILITOT 0.4 06/14/2018 0451   BILITOT 0.3 01/17/2018 1401   GFRNONAA >60 06/15/2018 0443   GFRAA >60 06/15/2018 0443   Lipase     Component Value Date/Time   LIPASE 103 (H) 06/02/2018 1801       Studies/Results: Ct Image Guided Drainage By Percutaneous Catheter  Result Date: 06/13/2018 INDICATION: 84 year old male with a history of right lower quadrant fluid collection. He has been referred for drainage. EXAM: CT-GUIDED DRAINAGE OF RIGHT LOWER QUADRANT FLUID COLLECTION MEDICATIONS: The patient is currently admitted to the hospital and receiving intravenous antibiotics. The antibiotics were administered within an appropriate time frame prior to the initiation of the procedure. ANESTHESIA/SEDATION: 2.0 mg IV Versed 100 mcg IV Fentanyl Moderate Sedation Time:  22 minutes The patient was continuously monitored during the procedure by the interventional radiology nurse under my direct supervision. COMPLICATIONS: None TECHNIQUE: Informed written consent was obtained from the patient after a thorough discussion of the procedural risks, benefits and alternatives. All questions were addressed. Maximal Sterile Barrier Technique was utilized including caps, mask, sterile gowns, sterile gloves, sterile drape, hand hygiene and skin antiseptic. A timeout was performed prior to the initiation of the procedure. PROCEDURE: The right lower quadrant  was prepped with chlorhexidine in a sterile fashion, and a sterile drape was applied covering the operative field. A sterile gown and sterile gloves were used for the procedure. Local anesthesia was provided with 1% Lidocaine. Once the patient is prepped and draped in the usual sterile fashion, 1% lidocaine was used for local anesthesia. Using CT guidance, trocar needle was advanced into the fluid collection of the right lower quadrant. Modified Seldinger  technique was used to place a 12 [place] drain. Drain was sutured in position. Approximately 450 cc of murky yellow fluid aspirated sample was sent for culture. Drain was attached to gravity drainage. Patient tolerated the procedure well and remained hemodynamically stable throughout. No complications were encountered and no significant blood loss. FINDINGS: CT again demonstrates fluid collection the right lower quadrant. Status post drain placement, there is significant reduction in fluid after aspiration of 450 cc of fluid. IMPRESSION: Status post drain placement of right lower quadrant fluid collection. Signed, Dulcy Fanny. Dellia Nims, RPVI [facility] Radiology Electronically Signed   By: Corrie Mckusick D.O.   On: 06/13/2018 17:17    Anti-infectives: Anti-infectives (From admission, onward)   Start     Dose/Rate Route Frequency Ordered Stop   06/10/18 1200  vancomycin (VANCOCIN) 1,500 mg in sodium chloride 0.9 % 500 mL IVPB  Status:  Discontinued     1,500 mg 250 mL/hr over 120 Minutes Intravenous Every 24 hours 06/09/18 1055 06/12/18 0819   06/09/18 1200  ceFEPIme (MAXIPIME) 1 g in sodium chloride 0.9 % 100 mL IVPB     1 g 200 mL/hr over 30 Minutes Intravenous Every 8 hours 06/09/18 1023     06/09/18 1100  vancomycin (VANCOCIN) 2,000 mg in sodium chloride 0.9 % 500 mL IVPB     2,000 mg 250 mL/hr over 120 Minutes Intravenous  Once 06/09/18 1023 06/09/18 1349       Assessment/Plan History of alcohol use Hx of hypertension Hx of hyperlipidemia  Readmit 0/86/5784 Acute diastolicCHFexacerbation, HCAP Atrial fibrillation with history of RVR-no anticoagulation secondary to prior bleeding Failure to thrive/deconditioning  Obstructing colon mass/adenocarcinoma 1/8positive nodes -S/P exploratory laparotomy, right hemicolectomy with primary anastomosis, 05/19/2018, Dr. Fanny Skates  -Intra-abdominal abscess - CT 9/2  showed 14.5x11.6 cmfluid collection within the central lower RIGHT abdomen - s/p perc drain 9/3 in IR, cx pending  FEN: HH diet, Ensure ON:GEXBMW 8/30>>, vancomycin 8/30>>9/2 Follow up: Dr. Waynetta Pean, DO - PCP DVT: Lovenox  Plan: Drain output is high (260cc/24hr), serous. Continue drain and follow culture.   Continue BID wet to dry dressing changes to midline abdominal wound.    LOS: 10 days    Wellington Hampshire , [facility] Surgery 06/15/2018, 10:05 AM Pager: [phone] Consults: [phone]  Agree with above.  The R drain output is almost clear. Midline wound okay. Needs to ambulate more.  Alphonsa Overall, MD, [facility] Surgery Pager: [phone] Office phone:  [phone]

## 2018-06-15 NOTE — Progress Notes (Signed)
Referring Physician(s): Marylu Lund  Supervising Physician: Daryll Brod  Patient Status:  [facility] - In-pt  Chief Complaint: None  Subjective:  RLQ peritoneal fluid collection s/p drain placement. Patient lethargic laying in bed, opens eyes to voice but does not answer questions. RLQ drain site c/d/i. About 50 mL serous fluid in gravity bag. Drain flushes/aspirates without resistance.  CXR 06/15/2018: 1. No evidence of acute cardiopulmonary abnormality. No evidence of pneumonia or pulmonary edema. 2. Decreased free intraperitoneal air under the RIGHT hemidiaphragm.   Allergies: No known allergies  Medications: Prior to Admission medications   Medication Sig Start Date End Date Taking? Authorizing Provider  acetaminophen (TYLENOL) 500 MG tablet Take 1 tablet (500 mg total) by mouth every 6 (six) hours as needed for mild pain. 05/30/18  Yes Rayburn, Floyce Stakes, PA-C  aspirin EC 81 MG EC tablet Take 1 tablet (81 mg total) by mouth daily. 02/25/17  Yes Conte, Tessa N, PA-C  furosemide (LASIX) 40 MG tablet Take 1 tablet (40 mg total) by mouth daily. 08/10/17 08/10/18 Yes Dorothy Spark, MD  gabapentin (NEURONTIN) 100 MG capsule Take 1 capsule (100 mg total) by mouth 2 (two) times daily. 05/30/18  Yes Rayburn, Claiborne Billings A, PA-C  metoprolol succinate (TOPROL XL) 50 MG 24 hr tablet Take 1 tablet (50 mg total) by mouth daily. Take with or immediately following a meal. 05/24/17  Yes Imogene Burn, PA-C  polyethylene glycol [facility]) packet Take 17 g by mouth daily. 05/30/18  Yes Rayburn, Claiborne Billings A, PA-C  pravastatin (PRAVACHOL) 40 MG tablet I po q hs Patient taking differently: Take 40 mg by mouth daily.  01/17/18  Yes Opalski, Deborah, DO  alum & mag hydroxide-simeth (MAALOX/MYLANTA) 200-200-20 MG/5ML suspension Take 30 mLs by mouth every 6 (six) hours as needed for indigestion (gas/bloating). Patient not taking: Reported on 06/02/2018 05/30/18   Rayburn, Claiborne Billings A, PA-C  ferrous sulfate 325 (65  FE) MG EC tablet Take 1 tablet (325 mg total) by mouth 3 (three) times daily with meals. Patient not taking: Reported on 06/02/2018 01/17/18   [name], DO  traMADol (ULTRAM) 50 MG tablet Take 1 tablet (50 mg total) by mouth every 6 (six) hours as needed for moderate pain or severe pain. Patient not taking: Reported on 06/02/2018 05/30/18   Rayburn, Floyce Stakes, PA-C     Vital Signs: BP 117/63 (BP Location: Left Arm)   Pulse 77   Temp 97.7 F (36.5 C) (Oral)   Resp 17   Ht 5\' 11"  (1.803 m)   Wt 215 lb 2.7 oz (97.6 kg)   SpO2 99%   BMI 30.01 kg/m   Physical Exam  Constitutional: He appears well-developed and well-nourished. He appears lethargic. He is sleeping. No distress.  Pulmonary/Chest: Effort normal. No respiratory distress.  Abdominal:  RLQ drain site without erythema, edema, or drainage; approximately 50 mL serous fluid in gravity bag; drain flushes/aspirates without resistance.  Neurological: He appears lethargic.  Skin: Skin is warm and dry.    Imaging: Ct Chest W Contrast  Result Date: 06/12/2018 CLINICAL DATA:  RIGHT colectomy 05/19/2018 for colon adenocarcinoma. Suspicion pneumoperitoneum 1 chest radiograph EXAM: CT CHEST, ABDOMEN, AND PELVIS WITH CONTRAST TECHNIQUE: Multidetector CT imaging of the chest, abdomen and pelvis was performed following the standard protocol during bolus administration of intravenous contrast. CONTRAST:  173mL OMNIPAQUE IOHEXOL 300 MG/ML  SOLN COMPARISON:  Radiograph 06/09/2018, CT 05/18/2018 FINDINGS: CT CHEST FINDINGS Cardiovascular: Post CABG anatomy. No large pulmonary emboli identified. No pericardial effusion.  Mediastinum/Nodes: No axillary supraclavicular adenopathy. No mediastinal hilar adenopathy. Esophagus normal. Lungs/Pleura: Moderate sized RIGHT pleural effusion with basilar atelectasis. Mild LEFT basilar atelectasis. No pneumonia. No pneumothorax. No pulmonary edema. Musculoskeletal: Degenerate spurring of the spine. Midline  sternotomy. CT ABDOMEN AND PELVIS FINDINGS Hepatobiliary: Extensive extraperitoneal free air position anterior to the liver and lateral to the liver. No focal hepatic lesion. Gallbladder collapsed. Portal veins patent. Pancreas: Pancreas is normal. No ductal dilatation. No pancreatic inflammation. Spleen: Normal spleen Adrenals/urinary tract: Stable nodule of the LEFT adrenal gland measures 2.2 cm. Previously characterized as adenoma. Stomach/Bowel: Stomach, duodenum, small-bowel appear normal. RIGHT hemicolectomy anatomy. No obstruction at the anastomosis. Oral contrast passes through the small bowel into the transverse colon through the anastomosis without gross evidence of leak. There is however large volume intraperitoneal free air in the RIGHT upper quadrant. This suggest perforated bowel or anastomotic breakdown. The transverse and descending colon are normal. Rectum appears normal. Within the central lower RIGHT abdomen large fluid collection measuring 14.5 by 11.6 cm. This collection is homogeneous low simple fluid attenuation cyst in centrally with a thin enhancing rim consistent with peritoneal abscess. (Image 101/2). The collection has a thin arm which extends to the LEFT pericolic gutter. No significant free fluid in the pelvis. Small amount free fluid anterior to the RIGHT hepatic lobe also with a peritoneal enhancement (image 72/2). Vascular/Lymphatic: Abdominal aorta is normal caliber with atherosclerotic calcification. There is no retroperitoneal or periportal lymphadenopathy. No pelvic lymphadenopathy. Reproductive: Prostate normal Other: Large intraperitoneal free air in the RIGHT upper quadrant described in the stomach bowel section. Large peritoneal abscess as described in the bowel section. Musculoskeletal: No aggressive osseous lesion. IMPRESSION: 1. Evidence all bowel perforation/anastomosed breakdown with large volume intraperitoneal free air fluid in the RIGHT upper quadrant adjacent and  above the liver. 2. Large loculated fluid collection in the RIGHT anterior pelvis extending along the LEFT pericolic gutter consists with PERITONEAL ABSCESS. 3. Of note, no large volume intraperitoneal free fluid. Oral contrast passed through the RIGHT colon anastomosis without evidence of leak. Findings could indicate a prior anastomotic breakdown which has healed in the interval with subsequent abscess formation from the prior leak / breakdown. Critical Value/emergent results were called by telephone at the time of interpretation on 06/12/2018 at 3:12 pm to Dr. Marylu Lund , who verbally acknowledged these results. Electronically Signed   By: Suzy Bouchard M.D.   On: 06/12/2018 15:12   Ct Abdomen Pelvis W Contrast  Result Date: 06/12/2018 CLINICAL DATA:  RIGHT colectomy 05/19/2018 for colon adenocarcinoma. Suspicion pneumoperitoneum 1 chest radiograph EXAM: CT CHEST, ABDOMEN, AND PELVIS WITH CONTRAST TECHNIQUE: Multidetector CT imaging of the chest, abdomen and pelvis was performed following the standard protocol during bolus administration of intravenous contrast. CONTRAST:  189mL OMNIPAQUE IOHEXOL 300 MG/ML  SOLN COMPARISON:  Radiograph 06/09/2018, CT 05/18/2018 FINDINGS: CT CHEST FINDINGS Cardiovascular: Post CABG anatomy. No large pulmonary emboli identified. No pericardial effusion. Mediastinum/Nodes: No axillary supraclavicular adenopathy. No mediastinal hilar adenopathy. Esophagus normal. Lungs/Pleura: Moderate sized RIGHT pleural effusion with basilar atelectasis. Mild LEFT basilar atelectasis. No pneumonia. No pneumothorax. No pulmonary edema. Musculoskeletal: Degenerate spurring of the spine. Midline sternotomy. CT ABDOMEN AND PELVIS FINDINGS Hepatobiliary: Extensive extraperitoneal free air position anterior to the liver and lateral to the liver. No focal hepatic lesion. Gallbladder collapsed. Portal veins patent. Pancreas: Pancreas is normal. No ductal dilatation. No pancreatic inflammation.  Spleen: Normal spleen Adrenals/urinary tract: Stable nodule of the LEFT adrenal gland measures 2.2 cm. Previously characterized as adenoma.  Stomach/Bowel: Stomach, duodenum, small-bowel appear normal. RIGHT hemicolectomy anatomy. No obstruction at the anastomosis. Oral contrast passes through the small bowel into the transverse colon through the anastomosis without gross evidence of leak. There is however large volume intraperitoneal free air in the RIGHT upper quadrant. This suggest perforated bowel or anastomotic breakdown. The transverse and descending colon are normal. Rectum appears normal. Within the central lower RIGHT abdomen large fluid collection measuring 14.5 by 11.6 cm. This collection is homogeneous low simple fluid attenuation cyst in centrally with a thin enhancing rim consistent with peritoneal abscess. (Image 101/2). The collection has a thin arm which extends to the LEFT pericolic gutter. No significant free fluid in the pelvis. Small amount free fluid anterior to the RIGHT hepatic lobe also with a peritoneal enhancement (image 72/2). Vascular/Lymphatic: Abdominal aorta is normal caliber with atherosclerotic calcification. There is no retroperitoneal or periportal lymphadenopathy. No pelvic lymphadenopathy. Reproductive: Prostate normal Other: Large intraperitoneal free air in the RIGHT upper quadrant described in the stomach bowel section. Large peritoneal abscess as described in the bowel section. Musculoskeletal: No aggressive osseous lesion. IMPRESSION: 1. Evidence all bowel perforation/anastomosed breakdown with large volume intraperitoneal free air fluid in the RIGHT upper quadrant adjacent and above the liver. 2. Large loculated fluid collection in the RIGHT anterior pelvis extending along the LEFT pericolic gutter consists with PERITONEAL ABSCESS. 3. Of note, no large volume intraperitoneal free fluid. Oral contrast passed through the RIGHT colon anastomosis without evidence of leak.  Findings could indicate a prior anastomotic breakdown which has healed in the interval with subsequent abscess formation from the prior leak / breakdown. Critical Value/emergent results were called by telephone at the time of interpretation on 06/12/2018 at 3:12 pm to Dr. Marylu Lund , who verbally acknowledged these results. Electronically Signed   By: Suzy Bouchard M.D.   On: 06/12/2018 15:12   Dg Chest Port 1 View  Result Date: 06/15/2018 CLINICAL DATA:  Dyspnea EXAM: PORTABLE CHEST 1 VIEW COMPARISON:  Chest x-rays dated 06/09/2018 and 05/22/2018. FINDINGS: Heart size and mediastinal contours are stable. Lungs are clear. No pleural effusion or pneumothorax seen. Diminished free intraperitoneal air under the RIGHT hemidiaphragm IMPRESSION: 1. No evidence of acute cardiopulmonary abnormality. No evidence of pneumonia or pulmonary edema. 2. Decreased free intraperitoneal air under the RIGHT hemidiaphragm. Electronically Signed   By: Franki Cabot M.D.   On: 06/15/2018 10:18   Ct Image Guided Drainage By Percutaneous Catheter  Result Date: 06/13/2018 INDICATION: 84 year old male with a history of right lower quadrant fluid collection. He has been referred for drainage. EXAM: CT-GUIDED DRAINAGE OF RIGHT LOWER QUADRANT FLUID COLLECTION MEDICATIONS: The patient is currently admitted to the hospital and receiving intravenous antibiotics. The antibiotics were administered within an appropriate time frame prior to the initiation of the procedure. ANESTHESIA/SEDATION: 2.0 mg IV Versed 100 mcg IV Fentanyl Moderate Sedation Time:  22 minutes The patient was continuously monitored during the procedure by the interventional radiology nurse under my direct supervision. COMPLICATIONS: None TECHNIQUE: Informed written consent was obtained from the patient after a thorough discussion of the procedural risks, benefits and alternatives. All questions were addressed. Maximal Sterile Barrier Technique was utilized including  caps, mask, sterile gowns, sterile gloves, sterile drape, hand hygiene and skin antiseptic. A timeout was performed prior to the initiation of the procedure. PROCEDURE: The right lower quadrant was prepped with chlorhexidine in a sterile fashion, and a sterile drape was applied covering the operative field. A sterile gown and sterile gloves were used for  the procedure. Local anesthesia was provided with 1% Lidocaine. Once the patient is prepped and draped in the usual sterile fashion, 1% lidocaine was used for local anesthesia. Using CT guidance, trocar needle was advanced into the fluid collection of the right lower quadrant. Modified Seldinger technique was used to place a 12 [place] drain. Drain was sutured in position. Approximately 450 cc of murky yellow fluid aspirated sample was sent for culture. Drain was attached to gravity drainage. Patient tolerated the procedure well and remained hemodynamically stable throughout. No complications were encountered and no significant blood loss. FINDINGS: CT again demonstrates fluid collection the right lower quadrant. Status post drain placement, there is significant reduction in fluid after aspiration of 450 cc of fluid. IMPRESSION: Status post drain placement of right lower quadrant fluid collection. Signed, Dulcy Fanny. Dellia Nims, RPVI [facility] Radiology Electronically Signed   By: Corrie Mckusick D.O.   On: 06/13/2018 17:17    Labs:  CBC: Recent Labs    06/11/18 0504 06/13/18 0438 06/14/18 0451 06/15/18 0443  WBC 10.7* 11.6* 9.7 8.4  HGB 10.2* 10.5* 9.6* 10.2*  HCT 34.0* 34.8* 31.1* 33.1*  PLT 332 302 290 304    COAGS: Recent Labs    06/02/18 1801 06/13/18 0957  INR 1.15 1.20  APTT  --  37*    BMP: Recent Labs    06/12/18 0436 06/13/18 0438 06/14/18 0451 06/15/18 0443  NA 141 140 141 141  K 4.3 5.1 4.0 3.9  CL 103 101 101 101  CO2 32 30 33* 33*  GLUCOSE 105* 119* 120* 124*  BUN 19 19  19 19   CALCIUM 8.2* 8.9 8.7* 8.6*  CREATININE 1.02 1.06 0.87 0.91  GFRNONAA >60 >60 >60 >60  GFRAA >60 >60 >60 >60    LIVER FUNCTION TESTS: Recent Labs    05/18/18 1851 06/02/18 1700 06/13/18 0438 06/14/18 0451  BILITOT 0.9 0.5 0.7 0.4  AST 28 47* 29 20  ALT 11 34 33 26  ALKPHOS 90 121 90 79  PROT 7.2 5.7* 5.3* 5.2*  ALBUMIN 4.2 2.3* 2.2* 1.9*    Assessment and Plan:  RLQ peritoneal fluid collection s/p drain placement. RLQ drain site stable, about 50 mL serous fluid in gravity bag, drain flushes/aspirates without resistance. CXR shows decreased free air under the right hemidiaphragm, no sign of pleural effusion. Continue with Qshift flushes and monitoring of output. Plan for repeat CT abdomen once drain output minimal for >48 hours prior to removal. IR to follow.   Electronically Signed: Earley Abide, PA-C 06/15/2018, 2:04 PM   I spent a total of 15 Minutes at the the patient's bedside AND on the patient's hospital floor or unit, greater than 50% of which was counseling/coordinating care for RLQ peritoneal fluid collection s/p drain placement.

## 2018-06-16 ENCOUNTER — Inpatient Hospital Stay (HOSPITAL_COMMUNITY): Payer: Medicare HMO

## 2018-06-16 ENCOUNTER — Encounter (HOSPITAL_COMMUNITY): Payer: Self-pay | Admitting: Radiology

## 2018-06-16 LAB — CBC WITH DIFFERENTIAL/PLATELET
Basophils Absolute: 0 10*3/uL (ref 0.0–0.1)
Basophils Relative: 0 %
EOS ABS: 0 10*3/uL (ref 0.0–0.7)
EOS PCT: 0 %
HCT: 32 % — ABNORMAL LOW (ref 39.0–52.0)
Hemoglobin: 9.8 g/dL — ABNORMAL LOW (ref 13.0–17.0)
LYMPHS ABS: 0.7 10*3/uL (ref 0.7–4.0)
Lymphocytes Relative: 5 %
MCH: 27.5 pg (ref 26.0–34.0)
MCHC: 30.6 g/dL (ref 30.0–36.0)
MCV: 89.9 fL (ref 78.0–100.0)
Monocytes Absolute: 0.8 10*3/uL (ref 0.1–1.0)
Monocytes Relative: 7 %
Neutro Abs: 11.3 10*3/uL — ABNORMAL HIGH (ref 1.7–7.7)
Neutrophils Relative %: 88 %
PLATELETS: 308 10*3/uL (ref 150–400)
RBC: 3.56 MIL/uL — AB (ref 4.22–5.81)
RDW: 14.7 % (ref 11.5–15.5)
WBC: 12.9 10*3/uL — AB (ref 4.0–10.5)

## 2018-06-16 LAB — BASIC METABOLIC PANEL
Anion gap: 9 (ref 5–15)
BUN: 19 mg/dL (ref 8–23)
CO2: 34 mmol/L — ABNORMAL HIGH (ref 22–32)
CREATININE: 1.22 mg/dL (ref 0.61–1.24)
Calcium: 8.6 mg/dL — ABNORMAL LOW (ref 8.9–10.3)
Chloride: 98 mmol/L (ref 98–111)
GFR calc Af Amer: >60 mL/min (ref >60)
GFR calc non Af Amer: 53 mL/min — ABNORMAL LOW (ref >60)
Glucose, Bld: 119 mg/dL — ABNORMAL HIGH (ref 70–99)
POTASSIUM: 4.2 mmol/L (ref 3.5–5.1)
SODIUM: 141 mmol/L (ref 135–145)

## 2018-06-16 LAB — AEROBIC/ANAEROBIC CULTURE W GRAM STAIN (SURGICAL/DEEP WOUND)

## 2018-06-16 LAB — URINALYSIS, ROUTINE W REFLEX MICROSCOPIC
Bacteria, UA: NONE SEEN
Bilirubin Urine: NEGATIVE
GLUCOSE, UA: NEGATIVE mg/dL
HGB URINE DIPSTICK: NEGATIVE
Ketones, ur: 5 mg/dL — AB
Leukocytes, UA: NEGATIVE
Nitrite: NEGATIVE
Protein, ur: 100 mg/dL — AB
SPECIFIC GRAVITY, URINE: 1.025 (ref 1.005–1.030)
pH: 5 (ref 5.0–8.0)

## 2018-06-16 LAB — BLOOD GAS, ARTERIAL
Acid-Base Excess: 5.7 mmol/L — ABNORMAL HIGH (ref 0.0–2.0)
Bicarbonate: 33 mmol/L — ABNORMAL HIGH (ref 20.0–28.0)
Drawn by: 295031
O2 Content: 4 L/min
O2 SAT: 93.5 %
PCO2 ART: 66.7 mmHg — AB (ref 32.0–48.0)
PH ART: 7.315 — AB (ref 7.350–7.450)
Patient temperature: 98.6
pO2, Arterial: 73.6 mmHg — ABNORMAL LOW (ref 83.0–108.0)

## 2018-06-16 LAB — AEROBIC/ANAEROBIC CULTURE (SURGICAL/DEEP WOUND)

## 2018-06-16 MED ORDER — SACCHAROMYCES BOULARDII 250 MG PO CAPS
250.0000 mg | ORAL_CAPSULE | Freq: Two times a day (BID) | ORAL | Status: DC
  Administered 2018-06-16 – 2018-06-22 (×13): 250 mg via ORAL
  Filled 2018-06-16 (×13): qty 1

## 2018-06-16 MED ORDER — FUROSEMIDE 10 MG/ML IJ SOLN
40.0000 mg | Freq: Every day | INTRAMUSCULAR | Status: DC
  Administered 2018-06-16 – 2018-06-18 (×3): 40 mg via INTRAVENOUS
  Filled 2018-06-16 (×3): qty 4

## 2018-06-16 MED ORDER — ACETYLCYSTEINE 20 % IN SOLN
4.0000 mL | Freq: Three times a day (TID) | RESPIRATORY_TRACT | Status: DC
  Administered 2018-06-16 – 2018-06-22 (×12): 4 mL via RESPIRATORY_TRACT
  Filled 2018-06-16 (×21): qty 4

## 2018-06-16 MED ORDER — IOHEXOL 300 MG/ML  SOLN
100.0000 mL | Freq: Once | INTRAMUSCULAR | Status: AC | PRN
  Administered 2018-06-16: 100 mL via INTRAVENOUS

## 2018-06-16 MED ORDER — FUROSEMIDE 40 MG PO TABS: 40.0000 mg | ORAL_TABLET | Freq: Every day | ORAL | Status: DC

## 2018-06-16 NOTE — Progress Notes (Signed)
Pt. Extremely congested and refuses to cough to clear secretions.  Pt. States that it is to hard to cough.  Encouraged deep breathing and cough to clear secretions.

## 2018-06-16 NOTE — Evaluation (Signed)
Clinical/Bedside Swallow Evaluation Patient Details  Name: Vincent Mcclure MRN: [id number] Date of Birth: 07/10/1932  Today's Date: 06/16/2018 Time: SLP Start Time (ACUTE ONLY): 1320 SLP Stop Time (ACUTE ONLY): 1345 SLP Time Calculation (min) (ACUTE ONLY): 25 min  Past Medical History:  Past Medical History:  Diagnosis Date  . Alcohol abuse   . Anemia   . Chronic low back pain 10/14/2011  . Colon polyps    hyperplastic (2004, 2010) and adenomatous (1990).    . COLONIC POLYPS, HX OF 03/05/2008  . Esophageal stricture    hx of  . Gastric ulcer   . GERD 03/05/2008  . GERD (gastroesophageal reflux disease) 1994   associated peptic strictures  . HYPERLIPIDEMIA 03/05/2008  . HYPERTENSION 03/05/2008  . Hypertension   . IBS 03/05/2008  . Impaired glucose tolerance 10/12/2011  . Iron deficiency anemia   . LEG CRAMPS 09/02/2010  . PAD (peripheral artery disease) ([place]) 10/14/2011  . PAD (peripheral artery disease) ([place]) 2013  . PERIPHERAL EDEMA 09/02/2010  . PERIPHERAL NEUROPATHY 03/05/2008  . Prostatitis    hx of  . VARICOSE VEINS, LOWER EXTREMITIES 06/09/2009   Past Surgical History:  Past Surgical History:  Procedure Laterality Date  . CATARACT EXTRACTION  bilat  . CORONARY ARTERY BYPASS GRAFT N/A 02/15/2017   Procedure: CORONARY ARTERY BYPASS GRAFTING times three  with left internal mammary harvest and endoscopic harvest of Right SVG. Grafts of LIMA to  LAD, SVG to Distal Circ, and to First Diag.;  Surgeon: Grace Isaac, MD;  Location: [facility];  Service: Open Heart Surgery;  Laterality: N/A;  . ESOPHAGOGASTRODUODENOSCOPY N/A 11/14/2014   Procedure: ESOPHAGOGASTRODUODENOSCOPY (EGD);  Surgeon: Jerene Bears, MD;  [facility] ENDOSCOPY;  Service: Endoscopy;  Laterality: N/A;  . LAPAROTOMY N/A 05/19/2018   Procedure: EXPLORATORY LAPAROTOMY RIGHT COLECTOMY;  Surgeon: Fanny Skates, MD;  Location: [facility] ORS;  Service: General;  Laterality: N/A;  . LEFT HEART CATH AND CORONARY ANGIOGRAPHY N/A 02/14/2017    Procedure: Left Heart Cath and Coronary Angiography;  Surgeon: Belva Crome, MD;  Location: [place] CV LAB;  Service: Cardiovascular;  Laterality: N/A;  . LUMBAR SPINE SURGERY  11/2008   Dr Joya Salm  . TEE WITHOUT CARDIOVERSION N/A 02/15/2017   Procedure: TRANSESOPHAGEAL ECHOCARDIOGRAM (TEE);  Surgeon: Grace Isaac, MD;  Location: [place];  Service: Open Heart Surgery;  Laterality: N/A;   HPI:  84 year old male admitted 06/02/18 with chest congestion, SOB, LE edema, weakness.. PMH: ETOH abuse, anemia, esophageal stricture, GERD, HLD, HTN. CXR = no evidence of acute abnormality, no evidence of PNA.   Assessment / Plan / Recommendation Clinical Impression  Pt seen at bedside for assessment of swallow function and safety and identification of least restrictive diet. Wife present. Neither pt nor wife report history of difficulty swallowing, with tolerance of regular solids and thin liquids prior to admit. Pt does indicate that he tends to open mouth breathe, which results in dry mouth. Once he has a sip of water, the issue is resolved.   Pt accepted trials of thin liquid, puree, and solid consistencies. No oral issues or overt s/s aspiration on any consistency. Pt does exhibit congested breath sounds, but recent CXR does not indicate PNA. Pt has a history of GERD and esophageal stricture, so adherence to strategies for management of esophageal dysmotility is recommended.  This information was posted in pt room.  At this time, recommend continuing with current diet. SLP will follow for assessment of diet tolerance and  education. MBS may be beneficial to rule out silent aspiration if lung sounds do not improve in a timely fashion.  SLP Visit Diagnosis: Dysphagia, unspecified (R13.10)    Aspiration Risk  Mild aspiration risk    Diet Recommendation Regular;Thin liquid   Liquid Administration via: Straw;Cup Medication Administration: Whole meds with liquid Supervision: Patient able to self  feed Compensations: Minimize environmental distractions;Slow rate;Small sips/bites Postural Changes: Seated upright at 90 degrees;Remain upright for at least 30 minutes after po intake    Other  Recommendations Oral Care Recommendations: Oral care QID   Follow up Recommendations (TBD)      Frequency and Duration min 2x/week  2 weeks       Prognosis Prognosis for Safe Diet Advancement: Good      Swallow Study   General Date of Onset: 06/02/18 HPI: 84 year old male admitted 06/02/18 with chest congestion, SOB, LE edema, weakness.. PMH: ETOH abuse, anemia, esophageal stricture, GERD, HLD, HTN. CXR = no evidence of acute abnormality, no evidence of PNA. Type of Study: Bedside Swallow Evaluation Previous Swallow Assessment: none Diet Prior to this Study: Thin liquids;Regular Temperature Spikes Noted: No Respiratory Status: Nasal cannula History of Recent Intubation: No Behavior/Cognition: Alert;Cooperative Oral Cavity Assessment: Within Functional Limits Oral Care Completed by SLP: No Oral Cavity - Dentition: Dentures, top;Dentures, bottom Vision: Functional for self-feeding Self-Feeding Abilities: Able to feed self Patient Positioning: Upright in bed Baseline Vocal Quality: Normal Volitional Cough: Strong Volitional Swallow: Able to elicit    Oral/Motor/Sensory Function Overall Oral Motor/Sensory Function: Within functional limits   Ice Chips Ice chips: Not tested   Thin Liquid Thin Liquid: Within functional limits Presentation: Straw    Nectar Thick Nectar Thick Liquid: Not tested   Honey Thick Honey Thick Liquid: Not tested   Puree Puree: Within functional limits Presentation: Spoon;Self Fed   Solid     Solid: Within functional limits Presentation: [facility] B. Quentin Ore, [facility], [place] Speech Language Pathologist [phone]  Phuoc, Huy 06/16/2018,1:49 PM

## 2018-06-16 NOTE — Progress Notes (Signed)
Referring Physician(s): Chiu,S/Newman,D  Supervising Physician: Corrie Mckusick  Patient Status:  [facility] - In-pt  Chief Complaint: Colon cancer, abdominal fluid collection   Subjective: Pt cont to have some abd pain, primarily RLQ region but some epig discomfort with deep breath, rattling cough; denies fever, CP,N/V or dysuria   Allergies: No known allergies  Medications: Prior to Admission medications   Medication Sig Start Date End Date Taking? Authorizing Provider  acetaminophen (TYLENOL) 500 MG tablet Take 1 tablet (500 mg total) by mouth every 6 (six) hours as needed for mild pain. 05/30/18  Yes Rayburn, Floyce Stakes, PA-C  aspirin EC 81 MG EC tablet Take 1 tablet (81 mg total) by mouth daily. 02/25/17  Yes Conte, Tessa N, PA-C  furosemide (LASIX) 40 MG tablet Take 1 tablet (40 mg total) by mouth daily. 08/10/17 08/10/18 Yes Dorothy Spark, MD  gabapentin (NEURONTIN) 100 MG capsule Take 1 capsule (100 mg total) by mouth 2 (two) times daily. 05/30/18  Yes Rayburn, Claiborne Billings A, PA-C  metoprolol succinate (TOPROL XL) 50 MG 24 hr tablet Take 1 tablet (50 mg total) by mouth daily. Take with or immediately following a meal. 05/24/17  Yes Imogene Burn, PA-C  polyethylene glycol [facility]) packet Take 17 g by mouth daily. 05/30/18  Yes Rayburn, Claiborne Billings A, PA-C  pravastatin (PRAVACHOL) 40 MG tablet I po q hs Patient taking differently: Take 40 mg by mouth daily.  01/17/18  Yes Opalski, Deborah, DO  alum & mag hydroxide-simeth (MAALOX/MYLANTA) 200-200-20 MG/5ML suspension Take 30 mLs by mouth every 6 (six) hours as needed for indigestion (gas/bloating). Patient not taking: Reported on 06/02/2018 05/30/18   Rayburn, Claiborne Billings A, PA-C  ferrous sulfate 325 (65 FE) MG EC tablet Take 1 tablet (325 mg total) by mouth 3 (three) times daily with meals. Patient not taking: Reported on 06/02/2018 01/17/18   [name], DO  traMADol (ULTRAM) 50 MG tablet Take 1 tablet (50 mg total) by mouth every 6 (six) hours  as needed for moderate pain or severe pain. Patient not taking: Reported on 06/02/2018 05/30/18   Rayburn, Floyce Stakes, PA-C     Vital Signs: BP 111/62 (BP Location: Left Arm)   Pulse 84   Temp 99.4 F (37.4 C) (Oral)   Resp 20   Ht 5\' 11"  (1.803 m)   Wt 215 lb 2.7 oz (97.6 kg)   SpO2 93%   BMI 30.01 kg/m   Physical Exam awake/alert; RLQ drain intact, insertion site ok, mild-mod tender, some dist, output 45 cc serous fluid; midline wound with intact gauze dressing  Imaging: Ct Chest W Contrast  Result Date: 06/12/2018 CLINICAL DATA:  RIGHT colectomy 05/19/2018 for colon adenocarcinoma. Suspicion pneumoperitoneum 1 chest radiograph EXAM: CT CHEST, ABDOMEN, AND PELVIS WITH CONTRAST TECHNIQUE: Multidetector CT imaging of the chest, abdomen and pelvis was performed following the standard protocol during bolus administration of intravenous contrast. CONTRAST:  121mL OMNIPAQUE IOHEXOL 300 MG/ML  SOLN COMPARISON:  Radiograph 06/09/2018, CT 05/18/2018 FINDINGS: CT CHEST FINDINGS Cardiovascular: Post CABG anatomy. No large pulmonary emboli identified. No pericardial effusion. Mediastinum/Nodes: No axillary supraclavicular adenopathy. No mediastinal hilar adenopathy. Esophagus normal. Lungs/Pleura: Moderate sized RIGHT pleural effusion with basilar atelectasis. Mild LEFT basilar atelectasis. No pneumonia. No pneumothorax. No pulmonary edema. Musculoskeletal: Degenerate spurring of the spine. Midline sternotomy. CT ABDOMEN AND PELVIS FINDINGS Hepatobiliary: Extensive extraperitoneal free air position anterior to the liver and lateral to the liver. No focal hepatic lesion. Gallbladder collapsed. Portal veins patent. Pancreas: Pancreas is normal. No  ductal dilatation. No pancreatic inflammation. Spleen: Normal spleen Adrenals/urinary tract: Stable nodule of the LEFT adrenal gland measures 2.2 cm. Previously characterized as adenoma. Stomach/Bowel: Stomach, duodenum, small-bowel appear normal. RIGHT hemicolectomy  anatomy. No obstruction at the anastomosis. Oral contrast passes through the small bowel into the transverse colon through the anastomosis without gross evidence of leak. There is however large volume intraperitoneal free air in the RIGHT upper quadrant. This suggest perforated bowel or anastomotic breakdown. The transverse and descending colon are normal. Rectum appears normal. Within the central lower RIGHT abdomen large fluid collection measuring 14.5 by 11.6 cm. This collection is homogeneous low simple fluid attenuation cyst in centrally with a thin enhancing rim consistent with peritoneal abscess. (Image 101/2). The collection has a thin arm which extends to the LEFT pericolic gutter. No significant free fluid in the pelvis. Small amount free fluid anterior to the RIGHT hepatic lobe also with a peritoneal enhancement (image 72/2). Vascular/Lymphatic: Abdominal aorta is normal caliber with atherosclerotic calcification. There is no retroperitoneal or periportal lymphadenopathy. No pelvic lymphadenopathy. Reproductive: Prostate normal Other: Large intraperitoneal free air in the RIGHT upper quadrant described in the stomach bowel section. Large peritoneal abscess as described in the bowel section. Musculoskeletal: No aggressive osseous lesion. IMPRESSION: 1. Evidence all bowel perforation/anastomosed breakdown with large volume intraperitoneal free air fluid in the RIGHT upper quadrant adjacent and above the liver. 2. Large loculated fluid collection in the RIGHT anterior pelvis extending along the LEFT pericolic gutter consists with PERITONEAL ABSCESS. 3. Of note, no large volume intraperitoneal free fluid. Oral contrast passed through the RIGHT colon anastomosis without evidence of leak. Findings could indicate a prior anastomotic breakdown which has healed in the interval with subsequent abscess formation from the prior leak / breakdown. Critical Value/emergent results were called by telephone at the time of  interpretation on 06/12/2018 at 3:12 pm to Dr. Marylu Lund , who verbally acknowledged these results. Electronically Signed   By: Suzy Bouchard M.D.   On: 06/12/2018 15:12   Ct Abdomen Pelvis W Contrast  Result Date: 06/12/2018 CLINICAL DATA:  RIGHT colectomy 05/19/2018 for colon adenocarcinoma. Suspicion pneumoperitoneum 1 chest radiograph EXAM: CT CHEST, ABDOMEN, AND PELVIS WITH CONTRAST TECHNIQUE: Multidetector CT imaging of the chest, abdomen and pelvis was performed following the standard protocol during bolus administration of intravenous contrast. CONTRAST:  133mL OMNIPAQUE IOHEXOL 300 MG/ML  SOLN COMPARISON:  Radiograph 06/09/2018, CT 05/18/2018 FINDINGS: CT CHEST FINDINGS Cardiovascular: Post CABG anatomy. No large pulmonary emboli identified. No pericardial effusion. Mediastinum/Nodes: No axillary supraclavicular adenopathy. No mediastinal hilar adenopathy. Esophagus normal. Lungs/Pleura: Moderate sized RIGHT pleural effusion with basilar atelectasis. Mild LEFT basilar atelectasis. No pneumonia. No pneumothorax. No pulmonary edema. Musculoskeletal: Degenerate spurring of the spine. Midline sternotomy. CT ABDOMEN AND PELVIS FINDINGS Hepatobiliary: Extensive extraperitoneal free air position anterior to the liver and lateral to the liver. No focal hepatic lesion. Gallbladder collapsed. Portal veins patent. Pancreas: Pancreas is normal. No ductal dilatation. No pancreatic inflammation. Spleen: Normal spleen Adrenals/urinary tract: Stable nodule of the LEFT adrenal gland measures 2.2 cm. Previously characterized as adenoma. Stomach/Bowel: Stomach, duodenum, small-bowel appear normal. RIGHT hemicolectomy anatomy. No obstruction at the anastomosis. Oral contrast passes through the small bowel into the transverse colon through the anastomosis without gross evidence of leak. There is however large volume intraperitoneal free air in the RIGHT upper quadrant. This suggest perforated bowel or anastomotic  breakdown. The transverse and descending colon are normal. Rectum appears normal. Within the central lower RIGHT abdomen large fluid collection measuring  14.5 by 11.6 cm. This collection is homogeneous low simple fluid attenuation cyst in centrally with a thin enhancing rim consistent with peritoneal abscess. (Image 101/2). The collection has a thin arm which extends to the LEFT pericolic gutter. No significant free fluid in the pelvis. Small amount free fluid anterior to the RIGHT hepatic lobe also with a peritoneal enhancement (image 72/2). Vascular/Lymphatic: Abdominal aorta is normal caliber with atherosclerotic calcification. There is no retroperitoneal or periportal lymphadenopathy. No pelvic lymphadenopathy. Reproductive: Prostate normal Other: Large intraperitoneal free air in the RIGHT upper quadrant described in the stomach bowel section. Large peritoneal abscess as described in the bowel section. Musculoskeletal: No aggressive osseous lesion. IMPRESSION: 1. Evidence all bowel perforation/anastomosed breakdown with large volume intraperitoneal free air fluid in the RIGHT upper quadrant adjacent and above the liver. 2. Large loculated fluid collection in the RIGHT anterior pelvis extending along the LEFT pericolic gutter consists with PERITONEAL ABSCESS. 3. Of note, no large volume intraperitoneal free fluid. Oral contrast passed through the RIGHT colon anastomosis without evidence of leak. Findings could indicate a prior anastomotic breakdown which has healed in the interval with subsequent abscess formation from the prior leak / breakdown. Critical Value/emergent results were called by telephone at the time of interpretation on 06/12/2018 at 3:12 pm to Dr. Marylu Lund , who verbally acknowledged these results. Electronically Signed   By: Suzy Bouchard M.D.   On: 06/12/2018 15:12   Dg Chest Port 1 View  Result Date: 06/15/2018 CLINICAL DATA:  Dyspnea EXAM: PORTABLE CHEST 1 VIEW COMPARISON:  Chest  x-rays dated 06/09/2018 and 05/22/2018. FINDINGS: Heart size and mediastinal contours are stable. Lungs are clear. No pleural effusion or pneumothorax seen. Diminished free intraperitoneal air under the RIGHT hemidiaphragm IMPRESSION: 1. No evidence of acute cardiopulmonary abnormality. No evidence of pneumonia or pulmonary edema. 2. Decreased free intraperitoneal air under the RIGHT hemidiaphragm. Electronically Signed   By: Franki Cabot M.D.   On: 06/15/2018 10:18   Ct Image Guided Drainage By Percutaneous Catheter  Result Date: 06/13/2018 INDICATION: 84 year old male with a history of right lower quadrant fluid collection. He has been referred for drainage. EXAM: CT-GUIDED DRAINAGE OF RIGHT LOWER QUADRANT FLUID COLLECTION MEDICATIONS: The patient is currently admitted to the hospital and receiving intravenous antibiotics. The antibiotics were administered within an appropriate time frame prior to the initiation of the procedure. ANESTHESIA/SEDATION: 2.0 mg IV Versed 100 mcg IV Fentanyl Moderate Sedation Time:  22 minutes The patient was continuously monitored during the procedure by the interventional radiology nurse under my direct supervision. COMPLICATIONS: None TECHNIQUE: Informed written consent was obtained from the patient after a thorough discussion of the procedural risks, benefits and alternatives. All questions were addressed. Maximal Sterile Barrier Technique was utilized including caps, mask, sterile gowns, sterile gloves, sterile drape, hand hygiene and skin antiseptic. A timeout was performed prior to the initiation of the procedure. PROCEDURE: The right lower quadrant was prepped with chlorhexidine in a sterile fashion, and a sterile drape was applied covering the operative field. A sterile gown and sterile gloves were used for the procedure. Local anesthesia was provided with 1% Lidocaine. Once the patient is prepped and draped in the usual sterile fashion, 1% lidocaine was used for local  anesthesia. Using CT guidance, trocar needle was advanced into the fluid collection of the right lower quadrant. Modified Seldinger technique was used to place a 12 [place] drain. Drain was sutured in position. Approximately 450 cc of murky yellow fluid aspirated sample was sent for culture.  Drain was attached to gravity drainage. Patient tolerated the procedure well and remained hemodynamically stable throughout. No complications were encountered and no significant blood loss. FINDINGS: CT again demonstrates fluid collection the right lower quadrant. Status post drain placement, there is significant reduction in fluid after aspiration of 450 cc of fluid. IMPRESSION: Status post drain placement of right lower quadrant fluid collection. Signed, Dulcy Fanny. Dellia Nims, RPVI [name] Radiology Electronically Signed   By: Corrie Mckusick D.O.   On: 06/13/2018 17:17    Labs:  CBC: Recent Labs    06/13/18 0438 06/14/18 0451 06/15/18 0443 06/16/18 0413  WBC 11.6* 9.7 8.4 12.9*  HGB 10.5* 9.6* 10.2* 9.8*  HCT 34.8* 31.1* 33.1* 32.0*  PLT 302 290 304 308    COAGS: Recent Labs    06/02/18 1801 06/13/18 0957  INR 1.15 1.20  APTT  --  37*    BMP: Recent Labs    06/13/18 0438 06/14/18 0451 06/15/18 0443 06/16/18 0413  NA 140 141 141 141  K 5.1 4.0 3.9 4.2  CL 101 101 101 98  CO2 30 33* 33* 34*  GLUCOSE 119* 120* 124* 119*  BUN 19 19 19 19   CALCIUM 8.9 8.7* 8.6* 8.6*  CREATININE 1.06 0.87 0.91 1.22  GFRNONAA >60 >60 >60 53*  GFRAA >60 >60 >60 >60    LIVER FUNCTION TESTS: Recent Labs    05/18/18 1851 06/02/18 1700 06/13/18 0438 06/14/18 0451  BILITOT 0.9 0.5 0.7 0.4  AST 28 47* 29 20  ALT 11 34 33 26  ALKPHOS 90 121 90 79  PROT 7.2 5.7* 5.3* 5.2*  ALBUMIN 4.2 2.3* 2.2* 1.9*    Assessment and Plan: Pt with hx colon ca, s/p rt colectomy 05/19/18; now with RLQ peritoneal fluid collection, s/p drain placement 9/3; drain fluid cx  neg to date, cytology neg, blood cx neg, urine cx pend; CXR ok; WBC 12.9(8.4), hgb 9.8(10.2), creat nl; with rising WBC, persistent abd pain will recheck CT    Electronically Signed: D. Rowe Robert, PA-C 06/16/2018, 11:04 AM   I spent a total of 15 minutes at the the patient's bedside AND on the patient's hospital floor or unit, greater than 50% of which was counseling/coordinating care for abdominal drain    Patient ID: Vincent Mcclure, male   DOB: 04/11/1932, 84 y.o.   MRN: [id number]

## 2018-06-16 NOTE — Progress Notes (Signed)
PT Cancellation Note  Patient Details Name: Vincent Mcclure MRN: [id number] DOB: [birth year]   Cancelled Treatment:     pt looks even worse today, HR in bed 119 - 125.  WOB with upper wet, rattled breathing.  Pt alert and recognized me today stating "I'm just not up to it today".  Pt getting ready to go down stairs to CT.     Rica Koyanagi  PTA [facility]  Acute  Rehab Pager      [phone]

## 2018-06-16 NOTE — Progress Notes (Signed)
PROGRESS NOTE  Vincent Mcclure DVV:[id number] DOB: 1931/11/28 DOA: 06/02/2018 PCP: [name], DO  HPI/Recap of past 22 hours: 84 year old male with medical history significant for CAD, colon adenocarcinoma, history of alcohol abuse, A. fib, hypertension,status post ex lap and right colectomy with primary anastomosis on 8/9 due to adenocarcinoma. Patient presented to the surgical clinic for postop eval where he was found to be weak, short of breath and congested, subsequently sent to the emergency department where he was evaluated by EDP and surgical team and was admitted with working diagnosis of suspected CHF and pneumoperitoneum.  Today, met pt awake, still sounds pretty congested. Denied any new complaints, no chest pain, no abdominal pain, no fever/chills.  Assessment/Plan: Active Problems:   HLD (hyperlipidemia)   HTN (hypertension)   GERD   h/o Anemia due to blood loss, acute   Alcohol use/ h/o heavy abuse   s/p NSTEMI (non-ST elevated myocardial infarction); with subsequent urgent CABG  5/18 ([place])   PAF (paroxysmal atrial fibrillation) ([name])   Cancer of ascending colon [id number] s/p right colectomy 05/19/2018   Congestive heart failure (CHF) ([name])   Hypokalemia   Atrial fibrillation with RVR ([name])   Hypoalbuminemia   Subacute confusional state   CHF (congestive heart failure) ([name])  Acute respiratory failure with hypoxia/hypercabia due to CHF exacerbation, HCAP with sepsis not present on admission Vs underlying COPD (past smoker, 62 pack year, quit about 9 yrs ago)  Afebrile, with worsening leukocytosis Still requiring O2 [place] 3L ABG showed pCO2 of 66.7, pO2 of 73.6, will follow closely, may need bipap if worsens (currently no increased WOB). May be worsened by recent narcotic use  CT chest showed a moderate sized R pleural effusion, no PNA,, pneumothorax or pulm edema, will place order for thoracentesis Repeat CXR with no acute cardiopulmonary abnormality Continue on  scheduled lasix Continue IV cefepime Scheduled duonebs, pulmicort, mucomyst nebs Continue supplemental O2, may need bipap if increased WOB is noted  Peritoneal abscess CT showed Large loculated fluid collection in R anterior pelvis extending along L pericolic gutter worrisome for peritoneal abscess IR on board, s/p RLQ drain placement on 06/13/18 (450cc of murky fluid aspirated). Repeat CT abdomen showed partial resolution of abscess Fluid culture, NGTD General surgery on board Continue cefepime  Pneumoperitoneum Felt to be related to recent colectomy on 05/19/2018 for adenoCA Again noted on CXR on 8/30 General Surgery on board  Acute on chronic diastolic CHF 2d echo with findings of EF of 55%, with mild pulmonary hypertension and grade 1 diastolic dysfunction.  Continues to tolerate diuresis  Chronic A. fib Rate controlled, chads Vasc score 5 noted Patient currently not on anticoagulation prior to admission due to GI bleed, pt also strongly declined and understands the risk of CVA in the future Continue ASA, metoprolol  Normocytic anemia of chronic disease 2/2 Ca colon Baseline 10-11 Continue iron supplementation Daily CBC  Alcohol abuse No evidence of ETOH withdrawals thus far Recommend to quit    Code Status: Full  Family Communication: None at bedside  Disposition Plan: SNF   Consultants:  General surgery  IR  Procedures:  Status post drain on 06/13/2018  Antimicrobials:  Cefepime  DVT prophylaxis: Lovenox   Objective: Vitals:   06/16/18 0927 06/16/18 0934 06/16/18 1100 06/16/18 1414  BP:    (!) 94/47  Pulse:    84  Resp:    (!) 28  Temp:    98.9 F (37.2 C)  TempSrc:    Oral  SpO2: 93%  93%  97%  Weight:   101.5 kg   Height:        Intake/Output Summary (Last 24 hours) at 06/16/2018 1524 Last data filed at 06/16/2018 0700 Gross per 24 hour  Intake 698.8 ml  Output 170 ml  Net 528.8 ml   Filed Weights   06/14/18 0504 06/15/18 0454  06/16/18 1100  Weight: 97.2 kg 97.6 kg 101.5 kg    Exam:   General: NAD  Cardiovascular: S1, S2 present  Respiratory: Coarse breath sounds bilaterally  Abdomen: Soft, mild TTP around drain, nondistended, bowel sounds present.  RUQ drain noted  Musculoskeletal: No pedal edema bilaterally  Skin: Normal  Psychiatry: Normal mood   Data Reviewed: CBC: Recent Labs  Lab 06/11/18 0504 06/13/18 0438 06/14/18 0451 06/15/18 0443 06/16/18 0413  WBC 10.7* 11.6* 9.7 8.4 12.9*  NEUTROABS  --   --   --  6.9 11.3*  HGB 10.2* 10.5* 9.6* 10.2* 9.8*  HCT 34.0* 34.8* 31.1* 33.1* 32.0*  MCV 92.6 92.3 90.1 89.9 89.9  PLT 332 302 290 304 546   Basic Metabolic Panel: Recent Labs  Lab 06/12/18 0436 06/13/18 0438 06/14/18 0451 06/15/18 0443 06/16/18 0413  NA 141 140 141 141 141  K 4.3 5.1 4.0 3.9 4.2  CL 103 101 101 101 98  CO2 32 30 33* 33* 34*  GLUCOSE 105* 119* 120* 124* 119*  BUN 19 19 19 19 19   CREATININE 1.02 1.06 0.87 0.91 1.22  CALCIUM 8.2* 8.9 8.7* 8.6* 8.6*   GFR: Estimated Creatinine Clearance: 54.7 mL/min (by C-G formula based on SCr of 1.22 mg/dL). Liver Function Tests: Recent Labs  Lab 06/13/18 0438 06/14/18 0451  AST 29 20  ALT 33 26  ALKPHOS 90 79  BILITOT 0.7 0.4  PROT 5.3* 5.2*  ALBUMIN 2.2* 1.9*   No results for input(s): LIPASE, AMYLASE in the last 168 hours. No results for input(s): AMMONIA in the last 168 hours. Coagulation Profile: Recent Labs  Lab 06/13/18 0957  INR 1.20   Cardiac Enzymes: No results for input(s): CKTOTAL, CKMB, CKMBINDEX, TROPONINI in the last 168 hours. BNP (last 3 results) No results for input(s): PROBNP in the last 8760 hours. HbA1C: No results for input(s): HGBA1C in the last 72 hours. CBG: Recent Labs  Lab 06/14/18 2114  GLUCAP 140*   Lipid Profile: No results for input(s): CHOL, HDL, LDLCALC, TRIG, CHOLHDL, LDLDIRECT in the last 72 hours. Thyroid Function Tests: No results for input(s): TSH, T4TOTAL,  FREET4, T3FREE, THYROIDAB in the last 72 hours. Anemia Panel: No results for input(s): VITAMINB12, FOLATE, FERRITIN, TIBC, IRON, RETICCTPCT in the last 72 hours. Urine analysis:    Component Value Date/Time   COLORURINE AMBER (A) 06/16/2018 1030   APPEARANCEUR CLEAR 06/16/2018 1030   LABSPEC 1.025 06/16/2018 1030   PHURINE 5.0 06/16/2018 1030   GLUCOSEU NEGATIVE 06/16/2018 1030   GLUCOSEU NEGATIVE 08/11/2016 1521   HGBUR NEGATIVE 06/16/2018 1030   BILIRUBINUR NEGATIVE 06/16/2018 1030   KETONESUR 5 (A) 06/16/2018 1030   PROTEINUR 100 (A) 06/16/2018 1030   UROBILINOGEN 1.0 08/11/2016 1521   NITRITE NEGATIVE 06/16/2018 1030   LEUKOCYTESUR NEGATIVE 06/16/2018 1030   Sepsis Labs: @LABRCNTIP (procalcitonin:4,lacticidven:4)  ) Recent Results (from the past 240 hour(s))  Culture, blood (x 2)     Status: None   Collection Time: 06/09/18 11:01 AM  Result Value Ref Range Status   Specimen Description   Final    BLOOD RIGHT ANTECUBITAL Performed at [facility]  [address] [place], [place] [address]    Special Requests   Final    BOTTLES DRAWN AEROBIC AND ANAEROBIC Blood Culture adequate volume Performed at [facility] [address], [place], [place] [address]    Culture   Final    NO GROWTH 5 DAYS Performed at [facility], [place] [address] [place], [place] [address]    Report Status 06/14/2018 FINAL  Final  Culture, blood (x 2)     Status: None   Collection Time: 06/09/18 11:06 AM  Result Value Ref Range Status   Specimen Description   Final    BLOOD RIGHT FOREARM Performed at [facility] [address] [place], [place] [address]    Special Requests   Final    BOTTLES DRAWN AEROBIC AND ANAEROBIC Blood Culture adequate volume Performed at [facility] [address] [place], [place] [address]    Culture   Final    NO GROWTH 5 DAYS Performed at [facility], [place] [address], [place], [place] [address]    Report Status 06/14/2018 FINAL  Final  MRSA PCR Screening     Status: None   Collection Time: 06/11/18  1:48 PM  Result Value Ref Range Status   MRSA by PCR NEGATIVE NEGATIVE Final    Comment:        The GeneXpert MRSA Assay (FDA approved for NASAL specimens only), is one component of a comprehensive MRSA colonization surveillance program. It is not intended to diagnose MRSA infection nor to guide or monitor treatment for MRSA infections. Performed at [facility], [place] [address] [place], [place] [address]   Aerobic/Anaerobic Culture (surgical/deep wound)     Status: None   Collection Time: 06/13/18  5:10 PM  Result Value Ref Range Status   Specimen Description   Final    ABDOMEN Performed at [facility] [address], [place], [place] [address]    Special Requests   Final    NONE Performed at [facility], [place] [address] [place], [place] [address]    Gram Stain   Final    ABUNDANT WBC PRESENT,BOTH PMN AND MONONUCLEAR FEW GRAM NEGATIVE RODS    Culture   Final    MODERATE BACTEROIDES FRAGILIS BETA LACTAMASE POSITIVE NO GROWTH  AEROBICALLY Performed at [facility], [place] [address], [place], [place] [address]    Report Status 06/16/2018 FINAL  Final      Studies: Ct Abdomen Pelvis W Contrast  Result Date: 06/16/2018 CLINICAL DATA:  F/u abdominal drain placed 9/3. Pt with recent surgery, Fever post-op "Colon cancer, abdominal fluid collection EXAM: CT ABDOMEN AND PELVIS WITH CONTRAST TECHNIQUE: Multidetector CT imaging of the abdomen and pelvis was performed using the standard protocol following bolus administration of intravenous contrast. CONTRAST:  165m OMNIPAQUE IOHEXOL 300 MG/ML  SOLN COMPARISON:  06/13/2013 FINDINGS: Lower chest: Moderate right and trace left pleural effusions as before. Atelectasis/consolidation posteriorly in the visualized lung bases. No pneumothorax.  Previous median sternotomy and CABG. Hepatobiliary: No focal liver abnormality is seen. No gallstones, gallbladder wall thickening, or biliary dilatation. Pancreas: Unremarkable. No pancreatic ductal dilatation or surrounding inflammatory changes. Spleen: Punctate calcified granulomas.  Normal size and enhancement. Adrenals/Urinary Tract: Stable 2.2 cm left adrenal nodule. Right adrenal unremarkable. Stable left renal cyst. No hydronephrosis. Urinary bladder incompletely distended. Stomach/Bowel: Stomach is partially distended. Small bowel nondilated. Some circumferential wall thickening in nondilated small bowel loops in the right  lateral abdomen below the gallbladder. Changes of right hemicolectomy. Residual colon is nondistended. Vascular/Lymphatic: Heavy aortoiliac arterial calcifications without aneurysm. Portal vein patent. No abdominal or pelvic adenopathy. Reproductive: Mild prostatic enlargement. Other: Stable right lower quadrant pigtail drain catheter. Interval decrease in size of residual collection measuring approximately 7.8 x 5.1 x3 cm (previously 14.7 x 11.6 x8.9). There has been interval increase in size of loculated gas and fluid collection, right subphrenic extending anteriorly in the right upper quadrant, measuring approximately 16.6 x 7.1 x17.7cm (previously 16.2 x 3.4 x 14.9cm). No new intra-abdominal collections. Musculoskeletal: Previous lower lumbar decompression surgery. Spondylitic changes in the visualized lower thoracic and lumbar spine. Negative for fracture or worrisome bone lesion. IMPRESSION: 1. Partial resolution of right lower quadrant collection post percutaneous drainage. The drain catheter is well positioned in the residual collection. 2. Enlarging right subphrenic and right upper quadrant gas and fluid collection. This would be approachable for percutaneous drainage if needed. Electronically Signed   By: Lucrezia Europe M.D.   On: 06/16/2018 13:35    Scheduled Meds: .  acetylcysteine  4 mL Nebulization TID  . aspirin EC  81 mg Oral Daily  . budesonide (PULMICORT) nebulizer solution  0.25 mg Nebulization BID  . dextromethorphan-guaiFENesin  1 tablet Oral BID  . enoxaparin (LOVENOX) injection  40 mg Subcutaneous Q24H  . feeding supplement (ENSURE ENLIVE)  237 mL Oral Q24H  . furosemide  40 mg Intravenous Daily  . gabapentin  100 mg Oral BID  . ipratropium-albuterol  3 mL Nebulization Q6H  . metoprolol tartrate  75 mg Oral BID  . multivitamins with iron  1 tablet Oral Daily  . pantoprazole  40 mg Oral Daily  . polyethylene glycol  17 g Oral Daily  . pravastatin  40 mg Oral QHS  . saccharomyces boulardii  250 mg Oral BID  . sodium chloride flush  5 mL Intracatheter Q8H    Continuous Infusions: . sodium chloride 10 mL/hr at 06/12/18 0449  . sodium chloride 10 mL/hr at 06/14/18 0400  . ceFEPime (MAXIPIME) IV 1 g (06/16/18 1153)     LOS: 11 days     [name] [facility], MD [facility]   If 7PM-7AM, please contact night-coverage [email or web address] Password [email or web address] 06/16/2018, 3:24 PM

## 2018-06-16 NOTE — Progress Notes (Addendum)
CC:  SOB  Subjective: He sounds terrible from a respiratory standpoint this AM, he also says he hurts and points over and above the drain as the site of discomfort.   The open portion of the wound is contracting and smaller than it was when I first saw him about 2 weeks ago.  He tolerated dressing change well.    Objective: Vital signs in last 24 hours: Temp:  [98.3 F (36.8 C)-99.7 F (37.6 C)] 99.4 F (37.4 C) (09/06 0534) Pulse Rate:  [84-96] 84 (09/06 0536) Resp:  [20-28] 20 (09/06 0534) BP: (98-115)/(58-77) 111/62 (09/06 0536) SpO2:  [88 %-97 %] 97 % (09/06 0534) Last BM Date: 06/15/18 PO not recorded 700 IV 700 urine Drain 45 ml recorded Afebrile, VSS WBC 12.9, Creatinine is up some.  Intake/Output from previous day: 09/05 0701 - 09/06 0700 In: 698.8 [I.V.:320; IV Piggyback:378.8] Out: 745 [Urine:700; Drains:45] Intake/Output this shift: No intake/output data recorded.  General appearance: alert, cooperative and no distress Resp: he has rales and course upper respiratory breath sounds.   GI: he complains of pain just above the drain site this AM.  The drain is serous fluid, the open wound is making slow progress.   Lab Results:  Recent Labs    06/15/18 0443 06/16/18 0413  WBC 8.4 12.9*  HGB 10.2* 9.8*  HCT 33.1* 32.0*  PLT 304 308    BMET Recent Labs    06/15/18 0443 06/16/18 0413  NA 141 141  K 3.9 4.2  CL 101 98  CO2 33* 34*  GLUCOSE 124* 119*  BUN 19 19  CREATININE 0.91 1.22  CALCIUM 8.6* 8.6*   PT/INR Recent Labs    06/13/18 0957  LABPROT 15.1  INR 1.20    Recent Labs  Lab 06/13/18 0438 06/14/18 0451  AST 29 20  ALT 33 26  ALKPHOS 90 79  BILITOT 0.7 0.4  PROT 5.3* 5.2*  ALBUMIN 2.2* 1.9*     Lipase     Component Value Date/Time   LIPASE 103 (H) 06/02/2018 1801     Medications: . aspirin EC  81 mg Oral Daily  . budesonide (PULMICORT) nebulizer solution  0.25 mg Nebulization BID  . dextromethorphan-guaiFENesin  1  tablet Oral BID  . enoxaparin (LOVENOX) injection  40 mg Subcutaneous Q24H  . feeding supplement (ENSURE ENLIVE)  237 mL Oral Q24H  . furosemide  40 mg Intravenous Daily  . gabapentin  100 mg Oral BID  . ipratropium-albuterol  3 mL Nebulization Q6H  . metoprolol tartrate  75 mg Oral BID  . multivitamins with iron  1 tablet Oral Daily  . pantoprazole  40 mg Oral Daily  . polyethylene glycol  17 g Oral Daily  . pravastatin  40 mg Oral QHS  . sodium chloride flush  5 mL Intracatheter Q8H   Anti-infectives (From admission, onward)   Start     Dose/Rate Route Frequency Ordered Stop   06/10/18 1200  vancomycin (VANCOCIN) 1,500 mg in sodium chloride 0.9 % 500 mL IVPB  Status:  Discontinued     1,500 mg 250 mL/hr over 120 Minutes Intravenous Every 24 hours 06/09/18 1055 06/12/18 0819   06/09/18 1200  ceFEPIme (MAXIPIME) 1 g in sodium chloride 0.9 % 100 mL IVPB     1 g 200 mL/hr over 30 Minutes Intravenous Every 8 hours 06/09/18 1023     06/09/18 1100  vancomycin (VANCOCIN) 2,000 mg in sodium chloride 0.9 % 500 mL IVPB  2,000 mg 250 mL/hr over 120 Minutes Intravenous  Once 06/09/18 1023 06/09/18 1349      Assessment/Plan History of alcohol use Hx of hypertension Hx of hyperlipidemia  Readmit 4/88/8916 Acute diastolicCHFexacerbation, HCAP Atrial fibrillation with history of RVR-no anticoagulation secondary to prior bleeding Failure to thrive/deconditioning  Obstructing colon mass/adenocarcinoma 1/8positive nodes -S/P exploratory laparotomy, right hemicolectomy with primary anastomosis, 05/19/2018 - Dr. Fanny Skates -Intra-abdominal abscess - CT 9/2 showed 14.5 x 11.6 cmfluid collection within the central lower RIGHT abdomen -s/p perc drain 9/3 in IR - 45 cc recorded over the last 24 hours  FEN: HH diet, Ensure ID: , vancomycin 8/30>>9/2;  merrem 8/30 =>> day 8 Follow up: Dr. Waynetta Pean, DO - PCP DVT: Lovenox     Plan:  Drainage is clearly better from the drain, but he is having pain and the WBC is going up.    Recheck labs again tomorrow, (daily labs ordered.)  He may need to repeat CT over the weekend.    Added probiotics today.    LOS: 11 days    Mcclure,Vincent 06/16/2018 [phone]  Agree with above.  He had swallowing test.  This was okay. Abscess drained.  Doing okay with his po's, though his wife says that his appetite is not very good.  Alphonsa Overall, MD, [facility] Surgery Pager: [phone] Office phone:  [phone]

## 2018-06-17 ENCOUNTER — Inpatient Hospital Stay (HOSPITAL_COMMUNITY): Payer: Medicare HMO

## 2018-06-17 ENCOUNTER — Encounter (HOSPITAL_COMMUNITY): Payer: Self-pay | Admitting: Radiology

## 2018-06-17 DIAGNOSIS — T8131XA Disruption of external operation (surgical) wound, not elsewhere classified, initial encounter: Secondary | ICD-10-CM

## 2018-06-17 DIAGNOSIS — R600 Localized edema: Secondary | ICD-10-CM

## 2018-06-17 DIAGNOSIS — R0602 Shortness of breath: Secondary | ICD-10-CM

## 2018-06-17 DIAGNOSIS — R531 Weakness: Secondary | ICD-10-CM

## 2018-06-17 DIAGNOSIS — Z9889 Other specified postprocedural states: Secondary | ICD-10-CM

## 2018-06-17 DIAGNOSIS — R609 Edema, unspecified: Secondary | ICD-10-CM

## 2018-06-17 DIAGNOSIS — T8131XS Disruption of external operation (surgical) wound, not elsewhere classified, sequela: Secondary | ICD-10-CM

## 2018-06-17 LAB — PROTEIN, PLEURAL OR PERITONEAL FLUID

## 2018-06-17 LAB — CBC WITH DIFFERENTIAL/PLATELET
BASOS PCT: 0 %
Basophils Absolute: 0 10*3/uL (ref 0.0–0.1)
Eosinophils Absolute: 0 10*3/uL (ref 0.0–0.7)
Eosinophils Relative: 0 %
HCT: 30.6 % — ABNORMAL LOW (ref 39.0–52.0)
HEMOGLOBIN: 9.5 g/dL — AB (ref 13.0–17.0)
Lymphocytes Relative: 5 %
Lymphs Abs: 0.9 10*3/uL (ref 0.7–4.0)
MCH: 27.6 pg (ref 26.0–34.0)
MCHC: 31 g/dL (ref 30.0–36.0)
MCV: 89 fL (ref 78.0–100.0)
MONO ABS: 1 10*3/uL (ref 0.1–1.0)
MONOS PCT: 6 %
NEUTROS PCT: 89 %
Neutro Abs: 15.9 10*3/uL — ABNORMAL HIGH (ref 1.7–7.7)
PLATELETS: 291 10*3/uL (ref 150–400)
RBC: 3.44 MIL/uL — ABNORMAL LOW (ref 4.22–5.81)
RDW: 15 % (ref 11.5–15.5)
WBC: 17.9 10*3/uL — ABNORMAL HIGH (ref 4.0–10.5)

## 2018-06-17 LAB — URINE CULTURE: Culture: NO GROWTH

## 2018-06-17 LAB — BASIC METABOLIC PANEL
Anion gap: 8 (ref 5–15)
BUN: 20 mg/dL (ref 8–23)
CALCIUM: 8.4 mg/dL — AB (ref 8.9–10.3)
CO2: 31 mmol/L (ref 22–32)
CREATININE: 1.35 mg/dL — AB (ref 0.61–1.24)
Chloride: 98 mmol/L (ref 98–111)
GFR calc non Af Amer: 47 mL/min — ABNORMAL LOW (ref >60)
GFR, EST AFRICAN AMERICAN: 54 mL/min — AB (ref >60)
Glucose, Bld: 146 mg/dL — ABNORMAL HIGH (ref 70–99)
Potassium: 4.5 mmol/L (ref 3.5–5.1)
SODIUM: 137 mmol/L (ref 135–145)

## 2018-06-17 LAB — BODY FLUID CELL COUNT WITH DIFFERENTIAL
Lymphs, Fluid: 5 %
Monocyte-Macrophage-Serous Fluid: 9 % — ABNORMAL LOW (ref 50–90)
Neutrophil Count, Fluid: 86 % — ABNORMAL HIGH (ref 0–25)
Total Nucleated Cell Count, Fluid: 3411 cu mm — ABNORMAL HIGH (ref 0–1000)

## 2018-06-17 LAB — GLUCOSE, PLEURAL OR PERITONEAL FLUID: Glucose, Fluid: 135 mg/dL

## 2018-06-17 MED ORDER — SODIUM CHLORIDE 0.9% FLUSH
5.0000 mL | Freq: Three times a day (TID) | INTRAVENOUS | Status: DC
  Administered 2018-06-17 – 2018-06-22 (×16): 5 mL

## 2018-06-17 MED ORDER — LIDOCAINE HCL 1 % IJ SOLN
INTRAMUSCULAR | Status: AC
  Filled 2018-06-17: qty 10

## 2018-06-17 MED ORDER — SODIUM CHLORIDE 0.9 % IV SOLN
3.0000 g | Freq: Four times a day (QID) | INTRAVENOUS | Status: DC
  Administered 2018-06-17 – 2018-06-21 (×16): 3 g via INTRAVENOUS
  Filled 2018-06-17 (×19): qty 3

## 2018-06-17 MED ORDER — METOPROLOL TARTRATE 25 MG PO TABS
25.0000 mg | ORAL_TABLET | Freq: Two times a day (BID) | ORAL | Status: DC
  Administered 2018-06-18 – 2018-06-22 (×9): 25 mg via ORAL
  Filled 2018-06-17 (×10): qty 1

## 2018-06-17 MED ORDER — IPRATROPIUM-ALBUTEROL 0.5-2.5 (3) MG/3ML IN SOLN
3.0000 mL | Freq: Three times a day (TID) | RESPIRATORY_TRACT | Status: DC
  Administered 2018-06-18 – 2018-06-22 (×12): 3 mL via RESPIRATORY_TRACT
  Filled 2018-06-17 (×14): qty 3

## 2018-06-17 NOTE — Progress Notes (Signed)
PROGRESS NOTE    Vincent Mcclure  TDV:[id number] DOB: April 23, 1932 DOA: 06/02/2018 PCP: [name], DO    Brief Narrative:84 year old male with medical history significant for CAD, colon adenocarcinoma, history of alcohol abuse, A. fib, hypertension,status post ex lap and right colectomy with primary anastomosis on 8/9 due to adenocarcinoma. Patient presented to the surgical clinic for postop eval where he was found to be weak, short of breath and congested, subsequently sent to the emergency department where he was evaluated by EDP and surgical team and was admitted with working diagnosis of suspected CHF and pneumoperitoneum  Assessment & Plan:   Active Problems:   HLD (hyperlipidemia)   HTN (hypertension)   GERD   h/o Anemia due to blood loss, acute   Alcohol use/ h/o heavy abuse   s/p NSTEMI (non-ST elevated myocardial infarction); with subsequent urgent CABG  5/18 ([place])   PAF (paroxysmal atrial fibrillation) ([name])   Cancer of ascending colon [id number] s/p right colectomy 05/19/2018   Congestive heart failure (CHF) ([name])   Hypokalemia   Atrial fibrillation with RVR ([name])   Hypoalbuminemia   Subacute confusional state   CHF (congestive heart failure) ([name])  Acute respiratory failure with hypoxia/hypercabia due to CHF exacerbation, HCAP with sepsis not present on admission Vs underlying COPD (past smoker, 62 pack year, quit about 9 yrs ago)  Afebrile, with worsening leukocytosis Still requiring O2 [place] CT chest showed a moderate sized R pleural effusion, no PNA,, pneumothorax or pulm edema, S/P 1 LIT THORACENTESIS YELLOW FLUID Repeat CXR with no acute cardiopulmonary abnormality Continue on scheduled lasix STARTED UNASYN 9/7 TO cover bacteroids fragilis Scheduled duonebs, pulmicort, mucomyst nebs Continue supplemental O2, may need bipap if increased WOB is noted  Peritoneal abscess CT showed Large loculated fluid collection in R anterior pelvis extending along L pericolic  gutter worrisome for peritoneal abscess IR on board, s/p RLQ drain placement on 06/13/18 (450cc of murky fluid aspirated). Repeat CT abdomen showed partial resolution of abscess Fluid culture, NGTD General surgery on board S/P SECOND DRAIN PLACED 9/7 CULTURE growing Bacteroids fragilis   Pneumoperitoneum Felt to be related to recent colectomy on 05/19/2018 for adenoCA Again noted on CXR on 8/30 General Surgery on board  Acute on chronic diastolic CHF 2d echo with findings of EF of 55%, with mild pulmonary hypertension and grade 1 diastolic dysfunction.  Continues to tolerate diuresis  Chronic A. fib Rate controlled, chads Vasc score 5 noted Patient currently not on anticoagulation prior to admission due to GI bleed, pt also strongly declined and understands the risk of CVA in the future Continue ASA, metoprolol  Normocytic anemia of chronic disease 2/2 Ca colon Baseline 10-11 Continue iron supplementation Daily CBC  Alcohol abuse No evidence of ETOH withdrawals thus far Recommend to quit  DVT prophylaxis:LOVENOX Code Status:dnr dw wife and patient  Family Communication:dw wife in detail. Disposition Plan:  Consultants: [facility] SURGERY  Procedures:S/P DRAIN 9/3 Antimicrobials:  unasyn Subjective: Reports feeling exhausted on 4 lit of oxygen   Objective: Vitals:   06/17/18 0843 06/17/18 0845 06/17/18 1111 06/17/18 1155  BP: (!) 95/53 (!) 96/46 (!) 105/59 (!) 115/57  Pulse:   (!) 105 93  Resp:   (!) 22 (!) 22  Temp:   100.3 F (37.9 C) 99.3 F (37.4 C)  TempSrc:   Oral Oral  SpO2:   94% 92%  Weight:      Height:        Intake/Output Summary (Last 24 hours) at 06/17/2018 1304 Last data  filed at 06/17/2018 1203 Gross per 24 hour  Intake 1012.33 ml  Output 285 ml  Net 727.33 ml   Filed Weights   06/15/18 0454 06/16/18 1100 06/17/18 0500  Weight: 97.6 kg 101.5 kg 99.3 kg    Examination:elderly male chronically sick appearing  General exam: Appears  calm and comfortable  Respiratory system diminished breath sounds b/l bases  Cardiovascular system: S1 & S2 heard, RRR. No JVD, murmurs, rubs, gallops or clicks. No pedal edema. Gastrointestinal system: Abdomen is distended, soft and tender. No organomegaly or masses felt. Normal bowel sounds heard.2 drains in place  Central nervous system: Alert and oriented. No focal neurological deficits. Extremities: Symmetric 5 x 5 power. Skin: No rashes, lesions or ulcers    Data Reviewed: I have personally reviewed following labs and imaging studies  CBC: Recent Labs  Lab 06/13/18 0438 06/14/18 0451 06/15/18 0443 06/16/18 0413 06/17/18 0506  WBC 11.6* 9.7 8.4 12.9* 17.9*  NEUTROABS  --   --  6.9 11.3* 15.9*  HGB 10.5* 9.6* 10.2* 9.8* 9.5*  HCT 34.8* 31.1* 33.1* 32.0* 30.6*  MCV 92.3 90.1 89.9 89.9 89.0  PLT 302 290 304 308 702   Basic Metabolic Panel: Recent Labs  Lab 06/13/18 0438 06/14/18 0451 06/15/18 0443 06/16/18 0413 06/17/18 0506  NA 140 141 141 141 137  K 5.1 4.0 3.9 4.2 4.5  CL 101 101 101 98 98  CO2 30 33* 33* 34* 31  GLUCOSE 119* 120* 124* 119* 146*  BUN 19 19 19 19 20   CREATININE 1.06 0.87 0.91 1.22 1.35*  CALCIUM 8.9 8.7* 8.6* 8.6* 8.4*   GFR: Estimated Creatinine Clearance: 48.9 mL/min (A) (by C-G formula based on SCr of 1.35 mg/dL (H)). Liver Function Tests: Recent Labs  Lab 06/13/18 0438 06/14/18 0451  AST 29 20  ALT 33 26  ALKPHOS 90 79  BILITOT 0.7 0.4  PROT 5.3* 5.2*  ALBUMIN 2.2* 1.9*   No results for input(s): LIPASE, AMYLASE in the last 168 hours. No results for input(s): AMMONIA in the last 168 hours. Coagulation Profile: Recent Labs  Lab 06/13/18 0957  INR 1.20   Cardiac Enzymes: No results for input(s): CKTOTAL, CKMB, CKMBINDEX, TROPONINI in the last 168 hours. BNP (last 3 results) No results for input(s): PROBNP in the last 8760 hours. HbA1C: No results for input(s): HGBA1C in the last 72 hours. CBG: Recent Labs  Lab  06/14/18 2114  GLUCAP 140*   Lipid Profile: No results for input(s): CHOL, HDL, LDLCALC, TRIG, CHOLHDL, LDLDIRECT in the last 72 hours. Thyroid Function Tests: No results for input(s): TSH, T4TOTAL, FREET4, T3FREE, THYROIDAB in the last 72 hours. Anemia Panel: No results for input(s): VITAMINB12, FOLATE, FERRITIN, TIBC, IRON, RETICCTPCT in the last 72 hours. Sepsis Labs: No results for input(s): PROCALCITON, LATICACIDVEN in the last 168 hours.  Recent Results (from the past 240 hour(s))  Culture, blood (x 2)     Status: None   Collection Time: 06/09/18 11:01 AM  Result Value Ref Range Status   Specimen Description   Final    BLOOD RIGHT ANTECUBITAL Performed at [facility] [address] [place], [place] [address]    Special Requests   Final    BOTTLES DRAWN AEROBIC AND ANAEROBIC Blood Culture adequate volume Performed at [facility] [address], [place], [place] [address]    Culture   Final    NO GROWTH 5 DAYS Performed at [facility], [place] [address] [place], [place]  [address]    Report Status 06/14/2018 FINAL  Final  Culture, blood (x 2)     Status: None   Collection Time: 06/09/18 11:06 AM  Result Value Ref Range Status   Specimen Description   Final    BLOOD RIGHT FOREARM Performed at [facility] [address], [place], [place] [address]    Special Requests   Final    BOTTLES DRAWN AEROBIC AND ANAEROBIC Blood Culture adequate volume Performed at [facility] [address], [place], [place] [address]    Culture   Final    NO GROWTH 5 DAYS Performed at [facility], [place] [address], [place], [place] [address]    Report Status 06/14/2018 FINAL  Final  MRSA PCR Screening     Status: None   Collection Time: 06/11/18  1:48 PM  Result Value Ref Range Status   MRSA by PCR NEGATIVE NEGATIVE Final    Comment:        The GeneXpert MRSA Assay (FDA approved for NASAL  specimens only), is one component of a comprehensive MRSA colonization surveillance program. It is not intended to diagnose MRSA infection nor to guide or monitor treatment for MRSA infections. Performed at [facility], [place] [address], [place], [place] [address]   Aerobic/Anaerobic Culture (surgical/deep wound)     Status: None   Collection Time: 06/13/18  5:10 PM  Result Value Ref Range Status   Specimen Description   Final    ABDOMEN Performed at [facility] [address], [place], [place] [address]    Special Requests   Final    NONE Performed at [facility], [place] [address], [place], [place] [address]    Gram Stain   Final    ABUNDANT WBC PRESENT,BOTH PMN AND MONONUCLEAR FEW GRAM NEGATIVE RODS    Culture   Final    MODERATE BACTEROIDES FRAGILIS BETA LACTAMASE POSITIVE NO GROWTH  AEROBICALLY Performed at [facility], [place] [address], [place], [place] [address]    Report Status 06/16/2018 FINAL  Final  Urine Culture     Status: None   Collection Time: 06/16/18 10:30 AM  Result Value Ref Range Status   Specimen Description   Final    URINE, CLEAN CATCH Performed at [facility], [place] [address], [place], [place] [address]    Special Requests   Final    NONE Performed at [facility], [place] [address], [place], [place] [address]    Culture   Final    NO GROWTH Performed at [facility], [place] [address], [place], [place] [address]    Report Status 06/17/2018 FINAL  Final         Radiology Studies: Dg Chest 1 View  Result Date: 06/17/2018 CLINICAL DATA:  84 year old male status post right-sided thoracentesis EXAM: CHEST  1 VIEW COMPARISON:  06/16/2018 FINDINGS: Cardiomediastinal silhouette unchanged in size and contour. Surgical changes of prior median sternotomy and CABG. Improved opacity at the right lung base, status post thoracentesis. No evidence of  pneumothorax. Lucency associated with the right hemidiaphragm is favored to represent increasing subdiaphragmatic gas component within the abdomen, present on the most recent CT scan. Blunting of the left costophrenic angle. Patchy airspace opacities at the lung bases. IMPRESSION: Improved appearance of right-sided pleural fluid status post thoracentesis, with no complicating features. Trace left-sided pleural effusion. Electronically Signed   By: Corrie Mckusick D.O.  On: 06/17/2018 09:24   Ct Abdomen Pelvis W Contrast  Result Date: 06/16/2018 CLINICAL DATA:  F/u abdominal drain placed 9/3. Pt with recent surgery, Fever post-op "Colon cancer, abdominal fluid collection EXAM: CT ABDOMEN AND PELVIS WITH CONTRAST TECHNIQUE: Multidetector CT imaging of the abdomen and pelvis was performed using the standard protocol following bolus administration of intravenous contrast. CONTRAST:  174mL OMNIPAQUE IOHEXOL 300 MG/ML  SOLN COMPARISON:  06/13/2013 FINDINGS: Lower chest: Moderate right and trace left pleural effusions as before. Atelectasis/consolidation posteriorly in the visualized lung bases. No pneumothorax. Previous median sternotomy and CABG. Hepatobiliary: No focal liver abnormality is seen. No gallstones, gallbladder wall thickening, or biliary dilatation. Pancreas: Unremarkable. No pancreatic ductal dilatation or surrounding inflammatory changes. Spleen: Punctate calcified granulomas.  Normal size and enhancement. Adrenals/Urinary Tract: Stable 2.2 cm left adrenal nodule. Right adrenal unremarkable. Stable left renal cyst. No hydronephrosis. Urinary bladder incompletely distended. Stomach/Bowel: Stomach is partially distended. Small bowel nondilated. Some circumferential wall thickening in nondilated small bowel loops in the right lateral abdomen below the gallbladder. Changes of right hemicolectomy. Residual colon is nondistended. Vascular/Lymphatic: Heavy aortoiliac arterial calcifications without aneurysm.  Portal vein patent. No abdominal or pelvic adenopathy. Reproductive: Mild prostatic enlargement. Other: Stable right lower quadrant pigtail drain catheter. Interval decrease in size of residual collection measuring approximately 7.8 x 5.1 x3 cm (previously 14.7 x 11.6 x8.9). There has been interval increase in size of loculated gas and fluid collection, right subphrenic extending anteriorly in the right upper quadrant, measuring approximately 16.6 x 7.1 x17.7cm (previously 16.2 x 3.4 x 14.9cm). No new intra-abdominal collections. Musculoskeletal: Previous lower lumbar decompression surgery. Spondylitic changes in the visualized lower thoracic and lumbar spine. Negative for fracture or worrisome bone lesion. IMPRESSION: 1. Partial resolution of right lower quadrant collection post percutaneous drainage. The drain catheter is well positioned in the residual collection. 2. Enlarging right subphrenic and right upper quadrant gas and fluid collection. This would be approachable for percutaneous drainage if needed. Electronically Signed   By: Lucrezia Europe M.D.   On: 06/16/2018 13:35   US Thoracentesis Asp Pleural Space W/img Guide  Result Date: 06/17/2018 INDICATION: Since surgery for colon cancer. Right pleural effusion. Request for diagnostic and therapeutic thoracentesis. EXAM: ULTRASOUND GUIDED RIGHT THORACENTESIS MEDICATIONS: 1% lidocaine 10 mL COMPLICATIONS: None immediate. PROCEDURE: An ultrasound guided thoracentesis was thoroughly discussed with the patient and questions answered. The benefits, risks, alternatives and complications were also discussed. The patient understands and wishes to proceed with the procedure. Written consent was obtained. Ultrasound was performed to localize and mark an adequate pocket of fluid in the right chest. The area was then prepped and draped in the normal sterile fashion. 1% Lidocaine was used for local anesthesia. Under ultrasound guidance a 6 Fr Safe-T-Centesis catheter was  introduced. Thoracentesis was performed. The catheter was removed and a dressing applied. FINDINGS: A total of approximately 1 L of clear yellow fluid was removed. Samples were sent to the laboratory as requested by the clinical team. IMPRESSION: Successful ultrasound guided right thoracentesis yielding 1 L of pleural fluid. No pneumothorax on post-procedure chest x-ray. Read by: Gareth Eagle, PA-C Electronically Signed   By: Corrie Mckusick D.O.   On: 06/17/2018 10:13        Scheduled Meds: . acetylcysteine  4 mL Nebulization TID  . aspirin EC  81 mg Oral Daily  . budesonide (PULMICORT) nebulizer solution  0.25 mg Nebulization BID  . dextromethorphan-guaiFENesin  1 tablet Oral BID  . enoxaparin (LOVENOX) injection  40  mg Subcutaneous Q24H  . feeding supplement (ENSURE ENLIVE)  237 mL Oral Q24H  . furosemide  40 mg Intravenous Daily  . gabapentin  100 mg Oral BID  . ipratropium-albuterol  3 mL Nebulization Q6H  . lidocaine      . metoprolol tartrate  75 mg Oral BID  . multivitamins with iron  1 tablet Oral Daily  . pantoprazole  40 mg Oral Daily  . polyethylene glycol  17 g Oral Daily  . pravastatin  40 mg Oral QHS  . saccharomyces boulardii  250 mg Oral BID  . sodium chloride flush  5 mL Intracatheter Q8H  . sodium chloride flush  5 mL Intracatheter Q8H   Continuous Infusions: . sodium chloride 10 mL/hr at 06/12/18 0449  . sodium chloride 10 mL/hr at 06/14/18 0400  . ampicillin-sulbactam (UNASYN) IV 3 g (06/17/18 1301)     LOS: 12 days      Georgette Shell, MD [facility] If 7PM-7AM, please contact night-coverage [email or web address] Password [email or web address] 06/17/2018, 1:04 PM

## 2018-06-17 NOTE — Progress Notes (Signed)
Referring Physician(s): Chiu,S/Newman, D  Supervising Physician: Arne Cleveland  Patient Status:  [facility] - In-pt  Chief Complaint:  Abdominal fluid collection  Subjective:  [name] is seen in [place] today for thoracentesis.  He says he is ok. He does report continued abdominal discomfort.  Allergies: No known allergies  Medications: Prior to Admission medications   Medication Sig Start Date End Date Taking? Authorizing Provider  acetaminophen (TYLENOL) 500 MG tablet Take 1 tablet (500 mg total) by mouth every 6 (six) hours as needed for mild pain. 05/30/18  Yes Rayburn, Floyce Stakes, PA-C  aspirin EC 81 MG EC tablet Take 1 tablet (81 mg total) by mouth daily. 02/25/17  Yes Conte, Tessa N, PA-C  furosemide (LASIX) 40 MG tablet Take 1 tablet (40 mg total) by mouth daily. 08/10/17 08/10/18 Yes Dorothy Spark, MD  gabapentin (NEURONTIN) 100 MG capsule Take 1 capsule (100 mg total) by mouth 2 (two) times daily. 05/30/18  Yes Rayburn, Claiborne Billings A, PA-C  metoprolol succinate (TOPROL XL) 50 MG 24 hr tablet Take 1 tablet (50 mg total) by mouth daily. Take with or immediately following a meal. 05/24/17  Yes Imogene Burn, PA-C  polyethylene glycol [facility]) packet Take 17 g by mouth daily. 05/30/18  Yes Rayburn, Claiborne Billings A, PA-C  pravastatin (PRAVACHOL) 40 MG tablet I po q hs Patient taking differently: Take 40 mg by mouth daily.  01/17/18  Yes Opalski, Deborah, DO  alum & mag hydroxide-simeth (MAALOX/MYLANTA) 200-200-20 MG/5ML suspension Take 30 mLs by mouth every 6 (six) hours as needed for indigestion (gas/bloating). Patient not taking: Reported on 06/02/2018 05/30/18   Rayburn, Claiborne Billings A, PA-C  ferrous sulfate 325 (65 FE) MG EC tablet Take 1 tablet (325 mg total) by mouth 3 (three) times daily with meals. Patient not taking: Reported on 06/02/2018 01/17/18   [name], DO  traMADol (ULTRAM) 50 MG tablet Take 1 tablet (50 mg total) by mouth every 6 (six) hours as needed for moderate pain or severe  pain. Patient not taking: Reported on 06/02/2018 05/30/18   Rayburn, Floyce Stakes, PA-C     Vital Signs: BP (!) 96/46 (BP Location: Left Arm)   Pulse 98   Temp 100.3 F (37.9 C) (Oral)   Resp 18   Ht 5\' 11"  (1.803 m)   Wt 99.3 kg   SpO2 94%   BMI 30.53 kg/m   Physical Exam Awake and alert NAD Sat up and tolerated Thoracentesis well RLQ drain in place ~25 mL output  Imaging: Ct Abdomen Pelvis W Contrast  Result Date: 06/16/2018 CLINICAL DATA:  F/u abdominal drain placed 9/3. Pt with recent surgery, Fever post-op "Colon cancer, abdominal fluid collection EXAM: CT ABDOMEN AND PELVIS WITH CONTRAST TECHNIQUE: Multidetector CT imaging of the abdomen and pelvis was performed using the standard protocol following bolus administration of intravenous contrast. CONTRAST:  153mL OMNIPAQUE IOHEXOL 300 MG/ML  SOLN COMPARISON:  06/13/2013 FINDINGS: Lower chest: Moderate right and trace left pleural effusions as before. Atelectasis/consolidation posteriorly in the visualized lung bases. No pneumothorax. Previous median sternotomy and CABG. Hepatobiliary: No focal liver abnormality is seen. No gallstones, gallbladder wall thickening, or biliary dilatation. Pancreas: Unremarkable. No pancreatic ductal dilatation or surrounding inflammatory changes. Spleen: Punctate calcified granulomas.  Normal size and enhancement. Adrenals/Urinary Tract: Stable 2.2 cm left adrenal nodule. Right adrenal unremarkable. Stable left renal cyst. No hydronephrosis. Urinary bladder incompletely distended. Stomach/Bowel: Stomach is partially distended. Small bowel nondilated. Some circumferential wall thickening in nondilated small bowel loops in the  right lateral abdomen below the gallbladder. Changes of right hemicolectomy. Residual colon is nondistended. Vascular/Lymphatic: Heavy aortoiliac arterial calcifications without aneurysm. Portal vein patent. No abdominal or pelvic adenopathy. Reproductive: Mild prostatic enlargement. Other:  Stable right lower quadrant pigtail drain catheter. Interval decrease in size of residual collection measuring approximately 7.8 x 5.1 x3 cm (previously 14.7 x 11.6 x8.9). There has been interval increase in size of loculated gas and fluid collection, right subphrenic extending anteriorly in the right upper quadrant, measuring approximately 16.6 x 7.1 x17.7cm (previously 16.2 x 3.4 x 14.9cm). No new intra-abdominal collections. Musculoskeletal: Previous lower lumbar decompression surgery. Spondylitic changes in the visualized lower thoracic and lumbar spine. Negative for fracture or worrisome bone lesion. IMPRESSION: 1. Partial resolution of right lower quadrant collection post percutaneous drainage. The drain catheter is well positioned in the residual collection. 2. Enlarging right subphrenic and right upper quadrant gas and fluid collection. This would be approachable for percutaneous drainage if needed. Electronically Signed   By: Lucrezia Europe M.D.   On: 06/16/2018 13:35   Dg Chest Port 1 View  Result Date: 06/15/2018 CLINICAL DATA:  Dyspnea EXAM: PORTABLE CHEST 1 VIEW COMPARISON:  Chest x-rays dated 06/09/2018 and 05/22/2018. FINDINGS: Heart size and mediastinal contours are stable. Lungs are clear. No pleural effusion or pneumothorax seen. Diminished free intraperitoneal air under the RIGHT hemidiaphragm IMPRESSION: 1. No evidence of acute cardiopulmonary abnormality. No evidence of pneumonia or pulmonary edema. 2. Decreased free intraperitoneal air under the RIGHT hemidiaphragm. Electronically Signed   By: Franki Cabot M.D.   On: 06/15/2018 10:18   Ct Image Guided Drainage By Percutaneous Catheter  Result Date: 06/13/2018 INDICATION: 84 year old male with a history of right lower quadrant fluid collection. He has been referred for drainage. EXAM: CT-GUIDED DRAINAGE OF RIGHT LOWER QUADRANT FLUID COLLECTION MEDICATIONS: The patient is currently admitted to the hospital and receiving intravenous  antibiotics. The antibiotics were administered within an appropriate time frame prior to the initiation of the procedure. ANESTHESIA/SEDATION: 2.0 mg IV Versed 100 mcg IV Fentanyl Moderate Sedation Time:  22 minutes The patient was continuously monitored during the procedure by the interventional radiology nurse under my direct supervision. COMPLICATIONS: None TECHNIQUE: Informed written consent was obtained from the patient after a thorough discussion of the procedural risks, benefits and alternatives. All questions were addressed. Maximal Sterile Barrier Technique was utilized including caps, mask, sterile gowns, sterile gloves, sterile drape, hand hygiene and skin antiseptic. A timeout was performed prior to the initiation of the procedure. PROCEDURE: The right lower quadrant was prepped with chlorhexidine in a sterile fashion, and a sterile drape was applied covering the operative field. A sterile gown and sterile gloves were used for the procedure. Local anesthesia was provided with 1% Lidocaine. Once the patient is prepped and draped in the usual sterile fashion, 1% lidocaine was used for local anesthesia. Using CT guidance, trocar needle was advanced into the fluid collection of the right lower quadrant. Modified Seldinger technique was used to place a 12 [place] drain. Drain was sutured in position. Approximately 450 cc of murky yellow fluid aspirated sample was sent for culture. Drain was attached to gravity drainage. Patient tolerated the procedure well and remained hemodynamically stable throughout. No complications were encountered and no significant blood loss. FINDINGS: CT again demonstrates fluid collection the right lower quadrant. Status post drain placement, there is significant reduction in fluid after aspiration of 450 cc of fluid. IMPRESSION: Status post drain placement of right lower quadrant fluid collection. Signed, Dulcy Fanny.  Dellia Nims, RPVI [facility]  [facility] Electronically Signed   By: Corrie Mckusick D.O.   On: 06/13/2018 17:17    Labs:  CBC: Recent Labs    06/14/18 0451 06/15/18 0443 06/16/18 0413 06/17/18 0506  WBC 9.7 8.4 12.9* 17.9*  HGB 9.6* 10.2* 9.8* 9.5*  HCT 31.1* 33.1* 32.0* 30.6*  PLT 290 304 308 291    COAGS: Recent Labs    06/02/18 1801 06/13/18 0957  INR 1.15 1.20  APTT  --  37*    BMP: Recent Labs    06/14/18 0451 06/15/18 0443 06/16/18 0413 06/17/18 0506  NA 141 141 141 137  K 4.0 3.9 4.2 4.5  CL 101 101 98 98  CO2 33* 33* 34* 31  GLUCOSE 120* 124* 119* 146*  BUN 19 19 19 20   CALCIUM 8.7* 8.6* 8.6* 8.4*  CREATININE 0.87 0.91 1.22 1.35*  GFRNONAA >60 >60 53* 47*  GFRAA >60 >60 >60 54*    LIVER FUNCTION TESTS: Recent Labs    05/18/18 1851 06/02/18 1700 06/13/18 0438 06/14/18 0451  BILITOT 0.9 0.5 0.7 0.4  AST 28 47* 29 20  ALT 11 34 33 26  ALKPHOS 90 121 90 79  PROT 7.2 5.7* 5.3* 5.2*  ALBUMIN 4.2 2.3* 2.2* 1.9*    Assessment and Plan:  Colon cancer, s/p rt colectomy 05/19/18 by [name].  Development of RLQ peritoneal fluid collection = s/p drain placement 9/3 by Dr.Wagner.  WBC 17.9 today.   CT done yesterday showed Stable right lower quadrant pigtail drain catheter. Interval decrease in size of residual collection measuring approximately 7.8 x 5.1 x3 cm (previously 14.7 x 11.6 x8.9).  There has been interval increase in size of loculated gas and fluid collection, right subphrenic extending anteriorly in the right upper quadrant, measuring approximately 16.6 x 7.1 x17.7cm (previously 16.2 x 3.4 x 14.9cm).  Images reviewed by Dr. Earleen Newport and Dr. Vernard Gambles.  Will proceed with placement of an additional drain today.  Risks and benefits discussed with the patient including bleeding, infection, damage to adjacent structures, bowel perforation/fistula connection, and sepsis.  All of the patient's questions were answered, patient is agreeable to proceed. Consent  signed and in chart.   Electronically Signed: Murrell Redden, PA-C 06/17/2018, 8:55 AM    I spent a total of 15 Minutes at the the patient's bedside AND on the patient's hospital floor or unit, greater than 50% of which was counseling/coordinating care for image guided drain placement.

## 2018-06-17 NOTE — Procedures (Signed)
PROCEDURE SUMMARY:  Successful US guided right thoracentesis. Yielded 1 liter of clear yellow fluid. Patient tolerated procedure well. No immediate complications.  Specimen was sent for labs.  Post procedure chest X-ray reveals no pneumothorax  Shermaine Brigham S Cori Henningsen PA-C 06/17/2018 9:24 AM

## 2018-06-17 NOTE — Procedures (Signed)
  Procedure: CT drain RUQ abscess 67f   EBL:   minimal Complications:  none immediate  See full dictation in [facility].  Dillard Cannon MD Main # [phone] Pager  [phone]

## 2018-06-17 NOTE — Progress Notes (Signed)
MEDICATION-RELATED CONSULT NOTE   IR Procedure Consult - Anticoagulant/Antiplatelet PTA/Inpatient Med List Review by Pharmacist    Procedure: CT drain of RUQ abscess    Completed: 06/17/18 @ 1022   Post-Procedural bleeding risk per IR MD assessment:  LOW  Antithrombotic medications on inpatient or PTA profile prior to procedure:     Lovenox 40mg  sq daily  Recommended restart time per IR Post-Procedure Guidelines:   At least 4hrs or at next standard dosing interval  Other considerations:      Plan:    Resume Lovenox 40mg  sq daily at 2200 (standard dosing interval)  Netta Cedars, PharmD, BCPS 06/17/2018@10 :38 AM

## 2018-06-17 NOTE — Progress Notes (Signed)
Pharmacy Antibiotic Note  Vincent Mcclure is a 84 y.o. male with right colonic mass s/p right hemicolectomy with primary anastomosis on 05/19/18 and was discharged from [facility] on 05/30/18.  She was sent to the ED from [place] office on 06/02/2018 for workup and treatment of suspected PNA.  Abd pelvis CT on 9/2 showed "bowel perforation/anastomosed breakdown with large volume intraperitoneal free air fluid in the RIGHT upper quadrant adjacent and above the liver. Large loculated fluid collection in the RIGHT anterior pelvis extending along the LEFT pericolic gutter consists with PERITONEAL ABSCESS."  He had ELQ drain placed on 9/3.  He's currently on cefepime for PNA and abscess.  Today, 06/17/2018: 9/2:15cm peritoneal abscess >> IR drain 9/3 9/7 CT guided drain of RUQ abscess (new collection), additional drain placed 9/7;  also R thoracentesis 1 L yellow fluid - day #9 of abx - WBC up to 17.9, SCr up to 1.35 - 9/3 abscess cx with bacteroides fragilis not covered by cefepime   Plan: - change cefepime to Unasyn 3 gm IV q6h  - f/u renal fxn, wbc, temp, culture data ___________________________  Height: 5\' 11"  (180.3 cm) Weight: 218 lb 14.7 oz (99.3 kg) IBW/kg (Calculated) : 75.3  Temp (24hrs), Avg:99.9 F (37.7 C), Min:98.9 F (37.2 C), Max:100.3 F (37.9 C)  Recent Labs  Lab 06/13/18 0438 06/14/18 0451 06/15/18 0443 06/16/18 0413 06/17/18 0506  WBC 11.6* 9.7 8.4 12.9* 17.9*  CREATININE 1.06 0.87 0.91 1.22 1.35*    Estimated Creatinine Clearance: 48.9 mL/min (A) (by C-G formula based on SCr of 1.35 mg/dL (H)).    Allergies  Allergen Reactions  . No Known Allergies     Antimicrobials this admission: 8/30 Vanc >> 9/2 8/30 Cefepime >>9/7 9/7 Unasyn>>  Dose adjustments this admission:  Microbiology results: 8/23 blood: ngf 8/24 urine: ngf 8/30 BCx: ngf 9/1 MRSA PCR: negative 9/3 abd abscess: few GNR; moderate bacteroides fragilis, beta lactamase positive F 9/7 abscess from  drain>> 9/7 R pleural fluid>> 9/6 Uxc>>ngf  Thank you for allowing pharmacy to be a part of this patient's care.  Eudelia Bunch, Pharm.D [phone] 06/17/2018 11:52 AM

## 2018-06-17 NOTE — Progress Notes (Signed)
Progress Note: General Surgery Service   Assessment/Plan: Active Problems:   HLD (hyperlipidemia)   HTN (hypertension)   GERD   h/o Anemia due to blood loss, acute   Alcohol use/ h/o heavy abuse   s/p NSTEMI (non-ST elevated myocardial infarction); with subsequent urgent CABG  5/18 ([place])   PAF (paroxysmal atrial fibrillation) ([place])   Cancer of ascending colon [id number] s/p right colectomy 05/19/2018   Congestive heart failure (CHF) ([name])   Hypokalemia   Atrial fibrillation with RVR ([name])   Hypoalbuminemia   Subacute confusional state   CHF (congestive heart failure) ([place]) Readmit 4/43/1540 Acute diastolicCHFexacerbation, HCAP Atrial fibrillation with history of RVR-no anticoagulation secondary to prior bleeding Failure to thrive/deconditioning  Obstructing colon mass/adenocarcinoma 1/8positive nodes -S/P exploratory laparotomy, right hemicolectomy with primary anastomosis, 05/19/2018 - Dr. Fanny Skates -Intra-abdominal abscess - CT 9/2 showed 14.5 x 11.6 cmfluid collection within the central lower RIGHT abdomen -s/p perc drain 9/3 in IR - 45 cc recorded over the last 24 hours - -repeat ct 9/6 shows large subdiaphragmatic collection with interval decrease of other drained collection -going for thoracentesis today  FEN: HH diet, Ensure ID: , vancomycin 8/30>>9/2;  merrem 8/30 =>> day 8 Follow up: Dr. Waynetta Pean, DO - PCP DVT: Lovenox   Plan: follow up thoracentesis, continue abx     LOS: 12 days  Chief Complaint/Subjective: Pain moderate, no new issues overnight  Objective: Vital signs in last 24 hours: Temp:  [98.9 F (37.2 C)-100.3 F (37.9 C)] 100.3 F (37.9 C) (09/07 0533) Pulse Rate:  [84-98] 98 (09/07 0533) Resp:  [18-28] 18 (09/07 0533) BP: (94-110)/(47-62) 99/62 (09/07 0533) SpO2:  [89 %-97 %] 94 % (09/07 0533) Weight:  [99.3 kg-101.5 kg] 99.3 kg (09/07 0500) Last BM Date:  06/15/18  Intake/Output from previous day: 09/06 0701 - 09/07 0700 In: 647.3 [P.O.:360; I.V.:110; IV Piggyback:172.3] Out: 525 [Urine:500; Drains:25] Intake/Output this shift: No intake/output data recorded.  Abd: soft, NT, ND, partially open midline wound with clean base  Extremities: no edema  Neuro: AOx4  Lab Results: CBC  Recent Labs    06/16/18 0413 06/17/18 0506  WBC 12.9* 17.9*  HGB 9.8* 9.5*  HCT 32.0* 30.6*  PLT 308 291   BMET Recent Labs    06/16/18 0413 06/17/18 0506  NA 141 137  K 4.2 4.5  CL 98 98  CO2 34* 31  GLUCOSE 119* 146*  BUN 19 20  CREATININE 1.22 1.35*  CALCIUM 8.6* 8.4*   PT/INR No results for input(s): LABPROT, INR in the last 72 hours. ABG Recent Labs    06/15/18 1046  PHART 7.315*  HCO3 33.0*    Studies/Results:  Anti-infectives: Anti-infectives (From admission, onward)   Start     Dose/Rate Route Frequency Ordered Stop   06/10/18 1200  vancomycin (VANCOCIN) 1,500 mg in sodium chloride 0.9 % 500 mL IVPB  Status:  Discontinued     1,500 mg 250 mL/hr over 120 Minutes Intravenous Every 24 hours 06/09/18 1055 06/12/18 0819   06/09/18 1200  ceFEPIme (MAXIPIME) 1 g in sodium chloride 0.9 % 100 mL IVPB     1 g 200 mL/hr over 30 Minutes Intravenous Every 8 hours 06/09/18 1023     06/09/18 1100  vancomycin (VANCOCIN) 2,000 mg in sodium chloride 0.9 % 500 mL IVPB     2,000 mg 250 mL/hr over 120 Minutes Intravenous  Once 06/09/18 1023 06/09/18 1349      Medications: Scheduled Meds: . acetylcysteine  4 mL Nebulization TID  .  aspirin EC  81 mg Oral Daily  . budesonide (PULMICORT) nebulizer solution  0.25 mg Nebulization BID  . dextromethorphan-guaiFENesin  1 tablet Oral BID  . enoxaparin (LOVENOX) injection  40 mg Subcutaneous Q24H  . feeding supplement (ENSURE ENLIVE)  237 mL Oral Q24H  . furosemide  40 mg Intravenous Daily  . gabapentin  100 mg Oral BID  . ipratropium-albuterol  3 mL Nebulization Q6H  . metoprolol tartrate   75 mg Oral BID  . multivitamins with iron  1 tablet Oral Daily  . pantoprazole  40 mg Oral Daily  . polyethylene glycol  17 g Oral Daily  . pravastatin  40 mg Oral QHS  . saccharomyces boulardii  250 mg Oral BID  . sodium chloride flush  5 mL Intracatheter Q8H   Continuous Infusions: . sodium chloride 10 mL/hr at 06/12/18 0449  . sodium chloride 10 mL/hr at 06/14/18 0400  . ceFEPime (MAXIPIME) IV 1 g (06/17/18 0415)   PRN Meds:.sodium chloride, acetaminophen, iopamidol, ipratropium, ondansetron **OR** ondansetron (ZOFRAN) IV, traMADol  Mickeal Skinner, MD Pg# [phone] [facility]

## 2018-06-18 ENCOUNTER — Inpatient Hospital Stay (HOSPITAL_COMMUNITY): Payer: Medicare HMO

## 2018-06-18 LAB — CBC WITH DIFFERENTIAL/PLATELET
Basophils Absolute: 0 10*3/uL (ref 0.0–0.1)
Basophils Relative: 0 %
Eosinophils Absolute: 0 10*3/uL (ref 0.0–0.7)
Eosinophils Relative: 0 %
HEMATOCRIT: 29.9 % — AB (ref 39.0–52.0)
Hemoglobin: 9.4 g/dL — ABNORMAL LOW (ref 13.0–17.0)
LYMPHS ABS: 0.7 10*3/uL (ref 0.7–4.0)
Lymphocytes Relative: 5 %
MCH: 28 pg (ref 26.0–34.0)
MCHC: 31.4 g/dL (ref 30.0–36.0)
MCV: 89 fL (ref 78.0–100.0)
MONO ABS: 0.8 10*3/uL (ref 0.1–1.0)
MONOS PCT: 6 %
Neutro Abs: 11.3 10*3/uL — ABNORMAL HIGH (ref 1.7–7.7)
Neutrophils Relative %: 89 %
Platelets: 269 10*3/uL (ref 150–400)
RBC: 3.36 MIL/uL — ABNORMAL LOW (ref 4.22–5.81)
RDW: 15 % (ref 11.5–15.5)
WBC: 12.8 10*3/uL — ABNORMAL HIGH (ref 4.0–10.5)

## 2018-06-18 LAB — BASIC METABOLIC PANEL
Anion gap: 9 (ref 5–15)
BUN: 27 mg/dL — AB (ref 8–23)
CO2: 32 mmol/L (ref 22–32)
CREATININE: 1.37 mg/dL — AB (ref 0.61–1.24)
Calcium: 8.4 mg/dL — ABNORMAL LOW (ref 8.9–10.3)
Chloride: 99 mmol/L (ref 98–111)
GFR calc non Af Amer: 46 mL/min — ABNORMAL LOW (ref >60)
GFR, EST AFRICAN AMERICAN: 53 mL/min — AB (ref >60)
GLUCOSE: 115 mg/dL — AB (ref 70–99)
Potassium: 3.8 mmol/L (ref 3.5–5.1)
Sodium: 140 mmol/L (ref 135–145)

## 2018-06-18 MED ORDER — FUROSEMIDE 10 MG/ML IJ SOLN
20.0000 mg | Freq: Every day | INTRAMUSCULAR | Status: DC
  Administered 2018-06-19 – 2018-06-21 (×3): 20 mg via INTRAVENOUS
  Filled 2018-06-18 (×3): qty 2

## 2018-06-18 NOTE — Progress Notes (Signed)
PROGRESS NOTE    Vincent Mcclure  EXH:[id number] DOB: 1932/03/16 DOA: 06/02/2018 PCP: [name], DO   Brief Narrative:84 year old male with medical history significant for CAD, colon adenocarcinoma, history of alcohol abuse, A. fib, hypertension,status post ex lap and right colectomy with primary anastomosis on 8/9 due to adenocarcinoma. Patient presented to the surgical clinic for postop eval where he was found to be weak, short of breath and congested, subsequently sent to the emergency department where he was evaluated by EDP and surgical team and was admitted with working diagnosis of suspected CHF and pneumoperitoneum Assessment & Plan:   Active Problems:   HLD (hyperlipidemia)   HTN (hypertension)   GERD   h/o Anemia due to blood loss, acute   Alcohol use/ h/o heavy abuse   s/p NSTEMI (non-ST elevated myocardial infarction); with subsequent urgent CABG  5/18 [facility])   Paroxysmal atrial fibrillation ([name])   Cancer of ascending colon [id number] s/p right colectomy 05/19/2018   Congestive heart failure (CHF) ([name])   Hypokalemia   Atrial fibrillation with RVR ([name])   Hypoalbuminemia   Subacute confusional state   CHF (congestive heart failure) ([name])   Peripheral edema   Post-operative state   Rupture of operation wound   General weakness   SOB (shortness of breath)  Acute respiratory failure with hypoxia/hypercabia due to CHF exacerbation, HCAP with sepsis not present on admission Vs underlying COPD (past smoker, 62 pack year, quit about 9 yrs ago)  Afebrile, withworseningleukocytosis Still requiring O2 [place] CT chest showed a moderate sized R pleural effusion, no PNA,, pneumothorax or pulm edema, S/P 1 LIT THORACENTESIS YELLOW FLUID Repeat CXR today shows mild cardiomegaly and small pleural effusion and underlying atelectasis. Decrease the dose of Lasix to 20 mg daily.  Monitor renal functions on diuretics. STARTED UNASYN 9/7 TO cover bacteroids fragilis Scheduled duonebs,  pulmicort, mucomyst nebs Continue supplemental O2, may need bipap if increased WOB is noted  Peritoneal abscess CT showed Large loculated fluid collection in R anterior pelvis extending along L pericolic gutter worrisome for peritoneal abscess IR on board, s/p RLQ drain placement on 06/13/18 (450cc of murky fluid aspirated). Repeat CT abdomen showed partial resolution of abscess Fluid culture, NGTD General surgery on board S/P SECOND DRAIN PLACED 9/7 CULTURE growing Bacteroids fragilis   Pneumoperitoneum Felt to be related to recent colectomy on 05/19/2018 for adenoCA Again noted on CXR on 8/30 General Surgery on board  Acute on chronic diastolic CHF 2d echo with findings of EF of 55%, with mild pulmonary hypertension and grade 1 diastolic dysfunction.  Continues to tolerate diuresis  Chronic A. fib Rate controlled, chads Vasc score 5 noted Patient currently not on anticoagulation prior to admission due to GI bleed, pt also strongly declined and understands the risk of CVA in the future Continue ASA, metoprolol  Normocytic anemia of chronic disease 2/2 Ca colon Baseline 10-11 Continue iron supplementation Daily CBC  Alcohol abuse No evidence of ETOH withdrawals thus far Recommend to quit  DVT prophylaxis:lovenox Code Status:dnr Family Communication: dw wife Disposition Plan: Pending clinical improvement patient most likely will need SNF placement.  Consultants:  Interventional radiology and general surgery Procedures: Status post right lower quadrant drain 9/ 3 and right upper quadrant drain 06/17/2018. Antimicrobials: Unasyn Subjective: Much more awake and alert today complaining of upper abdominal pain no nausea vomiting  Objective: Vitals:   06/17/18 2120 06/18/18 0704 06/18/18 0749 06/18/18 0859  BP: (!) 97/58 115/63    Pulse: 91 (!) 104  Resp: 16 (!) 26    Temp: 97.8 F (36.6 C) 98.6 F (37 C)    TempSrc: Oral Oral    SpO2: 97% 97% 97%   Weight:     100.3 kg  Height:        Intake/Output Summary (Last 24 hours) at 06/18/2018 1236 Last data filed at 06/18/2018 1136 Gross per 24 hour  Intake 1777 ml  Output 1160 ml  Net 617 ml   Filed Weights   06/16/18 1100 06/17/18 0500 06/18/18 0859  Weight: 101.5 kg 99.3 kg 100.3 kg    Examination:  General exam: Appears calm and comfortable  Respiratory system: Clear to auscultation. Respiratory effort normal. Cardiovascular system: S1 & S2 heard, RRR. No JVD, murmurs, rubs, gallops or clicks. No pedal edema. Gastrointestinal system: Abdomen is distended, soft and tender. No organomegaly or masses felt. Normal bowel sounds heard.  2 drains in place one in the right upper quadrant one in the right lower quadrant.  Draining yellow color fluid. Central nervous system: Alert and oriented. No focal neurological deficits. Extremities: Trace edema bilaterally Skin: No rashes, lesions or ulcers Psychiatry: Judgement and insight appear normal. Mood & affect appropriate.     Data Reviewed: I have personally reviewed following labs and imaging studies  CBC: Recent Labs  Lab 06/14/18 0451 06/15/18 0443 06/16/18 0413 06/17/18 0506 06/18/18 0430  WBC 9.7 8.4 12.9* 17.9* 12.8*  NEUTROABS  --  6.9 11.3* 15.9* 11.3*  HGB 9.6* 10.2* 9.8* 9.5* 9.4*  HCT 31.1* 33.1* 32.0* 30.6* 29.9*  MCV 90.1 89.9 89.9 89.0 89.0  PLT 290 304 308 291 841   Basic Metabolic Panel: Recent Labs  Lab 06/14/18 0451 06/15/18 0443 06/16/18 0413 06/17/18 0506 06/18/18 0430  NA 141 141 141 137 140  K 4.0 3.9 4.2 4.5 3.8  CL 101 101 98 98 99  CO2 33* 33* 34* 31 32  GLUCOSE 120* 124* 119* 146* 115*  BUN 19 19 19 20  27*  CREATININE 0.87 0.91 1.22 1.35* 1.37*  CALCIUM 8.7* 8.6* 8.6* 8.4* 8.4*   GFR: Estimated Creatinine Clearance: 48.4 mL/min (A) (by C-G formula based on SCr of 1.37 mg/dL (H)). Liver Function Tests: Recent Labs  Lab 06/13/18 0438 06/14/18 0451  AST 29 20  ALT 33 26  ALKPHOS 90 79  BILITOT  0.7 0.4  PROT 5.3* 5.2*  ALBUMIN 2.2* 1.9*   No results for input(s): LIPASE, AMYLASE in the last 168 hours. No results for input(s): AMMONIA in the last 168 hours. Coagulation Profile: Recent Labs  Lab 06/13/18 0957  INR 1.20   Cardiac Enzymes: No results for input(s): CKTOTAL, CKMB, CKMBINDEX, TROPONINI in the last 168 hours. BNP (last 3 results) No results for input(s): PROBNP in the last 8760 hours. HbA1C: No results for input(s): HGBA1C in the last 72 hours. CBG: Recent Labs  Lab 06/14/18 2114  GLUCAP 140*   Lipid Profile: No results for input(s): CHOL, HDL, LDLCALC, TRIG, CHOLHDL, LDLDIRECT in the last 72 hours. Thyroid Function Tests: No results for input(s): TSH, T4TOTAL, FREET4, T3FREE, THYROIDAB in the last 72 hours. Anemia Panel: No results for input(s): VITAMINB12, FOLATE, FERRITIN, TIBC, IRON, RETICCTPCT in the last 72 hours. Sepsis Labs: No results for input(s): PROCALCITON, LATICACIDVEN in the last 168 hours.  Recent Results (from the past 240 hour(s))  Culture, blood (x 2)     Status: None   Collection Time: 06/09/18 11:01 AM  Result Value Ref Range Status   Specimen Description   Final  BLOOD RIGHT ANTECUBITAL Performed at [facility] [address] [place], [place] [address]    Special Requests   Final    BOTTLES DRAWN AEROBIC AND ANAEROBIC Blood Culture adequate volume Performed at [facility] [address], [place], [place] [address]    Culture   Final    NO GROWTH 5 DAYS Performed at [facility], [place] [address] [place], [place] [address]    Report Status 06/14/2018 FINAL  Final  Culture, blood (x 2)     Status: None   Collection Time: 06/09/18 11:06 AM  Result Value Ref Range Status   Specimen Description   Final    BLOOD RIGHT FOREARM Performed at [facility] [address], [place], [place] [address]    Special Requests   Final    BOTTLES DRAWN AEROBIC AND  ANAEROBIC Blood Culture adequate volume Performed at [facility] [address], [place], [place] [address]    Culture   Final    NO GROWTH 5 DAYS Performed at [facility], [place] [address], [place], [place] [address]    Report Status 06/14/2018 FINAL  Final  MRSA PCR Screening     Status: None   Collection Time: 06/11/18  1:48 PM  Result Value Ref Range Status   MRSA by PCR NEGATIVE NEGATIVE Final    Comment:        The GeneXpert MRSA Assay (FDA approved for NASAL specimens only), is one component of a comprehensive MRSA colonization surveillance program. It is not intended to diagnose MRSA infection nor to guide or monitor treatment for MRSA infections. Performed at [facility], [place] [address], [place], [place] [address]   Aerobic/Anaerobic Culture (surgical/deep wound)     Status: None   Collection Time: 06/13/18  5:10 PM  Result Value Ref Range Status   Specimen Description   Final    ABDOMEN Performed at [facility] [address] [place], [place] [address]    Special Requests   Final    NONE Performed at [facility], [place] [address] [place], [place] [address]    Gram Stain   Final    ABUNDANT WBC PRESENT,BOTH PMN AND MONONUCLEAR FEW GRAM NEGATIVE RODS    Culture   Final    MODERATE BACTEROIDES FRAGILIS BETA LACTAMASE POSITIVE NO GROWTH  AEROBICALLY Performed at [facility], [place] [address], [place], [place] [address]    Report Status 06/16/2018 FINAL  Final  Urine Culture     Status: None   Collection Time: 06/16/18 10:30 AM  Result Value Ref Range Status   Specimen Description   Final    URINE, CLEAN CATCH Performed at [facility], [place] [address], [place], [place] [address]    Special Requests   Final    NONE Performed at [facility] [address], [place],  [address]    Culture   Final    NO GROWTH Performed  at [facility], [place] [address] [place],  [address]    Report Status 06/17/2018 FINAL  Final  Body fluid culture     Status: None (Preliminary result)   Collection Time: 06/17/18  8:55 AM  Result Value Ref Range Status   Specimen Description   Final    PLEURAL RIGHT Performed at [facility] [address] [place],  [address]    Special Requests  Final    NONE Performed at [facility], [place] [address], [place], [place] [address]    Gram Stain   Final    ABUNDANT WBC PRESENT,BOTH PMN AND MONONUCLEAR NO ORGANISMS SEEN    Culture   Final    NO GROWTH < 24 HOURS Performed at [facility] [address] [place], [place] [address]    Report Status PENDING  Incomplete  Aerobic/Anaerobic Culture (surgical/deep wound)     Status: None (Preliminary result)   Collection Time: 06/17/18 10:24 AM  Result Value Ref Range Status   Specimen Description   Final    ABSCESS RUQ Performed at [facility] [address] [place], [place] [address]    Special Requests   Final    NONE Performed at [facility], [place] [address] [place], [place] [address]    Gram Stain   Final    ABUNDANT WBC PRESENT, PREDOMINANTLY PMN ABUNDANT GRAM NEGATIVE RODS MODERATE GRAM POSITIVE RODS FEW GRAM POSITIVE COCCI IN PAIRS RARE BUDDING YEAST SEEN Performed at [facility], [place] [address], [place], [place] [address]    Culture PENDING  Incomplete   Report Status PENDING  Incomplete         Radiology Studies: Dg Chest 1 View  Result Date: 06/18/2018 CLINICAL DATA:  Hypoxia EXAM: CHEST  1 VIEW COMPARISON:  June 17, 2017 FINDINGS: No pneumothorax. Stable mild cardiomegaly. Small effusions with underlying atelectasis. No overt edema or other abnormality. IMPRESSION: Mild cardiomegaly with small pleural effusions and underlying atelectasis. Electronically Signed   By: Dorise Bullion III M.D    On: 06/18/2018 07:17   Dg Chest 1 View  Result Date: 06/17/2018 CLINICAL DATA:  84 year old male status post right-sided thoracentesis EXAM: CHEST  1 VIEW COMPARISON:  06/16/2018 FINDINGS: Cardiomediastinal silhouette unchanged in size and contour. Surgical changes of prior median sternotomy and CABG. Improved opacity at the right lung base, status post thoracentesis. No evidence of pneumothorax. Lucency associated with the right hemidiaphragm is favored to represent increasing subdiaphragmatic gas component within the abdomen, present on the most recent CT scan. Blunting of the left costophrenic angle. Patchy airspace opacities at the lung bases. IMPRESSION: Improved appearance of right-sided pleural fluid status post thoracentesis, with no complicating features. Trace left-sided pleural effusion. Electronically Signed   By: Corrie Mckusick D.O.   On: 06/17/2018 09:24   Ct Abdomen Pelvis W Contrast  Result Date: 06/16/2018 CLINICAL DATA:  F/u abdominal drain placed 9/3. Pt with recent surgery, Fever post-op "Colon cancer, abdominal fluid collection EXAM: CT ABDOMEN AND PELVIS WITH CONTRAST TECHNIQUE: Multidetector CT imaging of the abdomen and pelvis was performed using the standard protocol following bolus administration of intravenous contrast. CONTRAST:  11mL OMNIPAQUE IOHEXOL 300 MG/ML  SOLN COMPARISON:  06/13/2013 FINDINGS: Lower chest: Moderate right and trace left pleural effusions as before. Atelectasis/consolidation posteriorly in the visualized lung bases. No pneumothorax. Previous median sternotomy and CABG. Hepatobiliary: No focal liver abnormality is seen. No gallstones, gallbladder wall thickening, or biliary dilatation. Pancreas: Unremarkable. No pancreatic ductal dilatation or surrounding inflammatory changes. Spleen: Punctate calcified granulomas.  Normal size and enhancement. Adrenals/Urinary Tract: Stable 2.2 cm left adrenal nodule. Right adrenal unremarkable. Stable left renal cyst. No  hydronephrosis. Urinary bladder incompletely distended. Stomach/Bowel: Stomach is partially distended. Small bowel nondilated. Some circumferential wall thickening in nondilated small bowel loops in the right lateral abdomen below the gallbladder. Changes of right hemicolectomy. Residual colon is nondistended. Vascular/Lymphatic: Heavy aortoiliac  arterial calcifications without aneurysm. Portal vein patent. No abdominal or pelvic adenopathy. Reproductive: Mild prostatic enlargement. Other: Stable right lower quadrant pigtail drain catheter. Interval decrease in size of residual collection measuring approximately 7.8 x 5.1 x3 cm (previously 14.7 x 11.6 x8.9). There has been interval increase in size of loculated gas and fluid collection, right subphrenic extending anteriorly in the right upper quadrant, measuring approximately 16.6 x 7.1 x17.7cm (previously 16.2 x 3.4 x 14.9cm). No new intra-abdominal collections. Musculoskeletal: Previous lower lumbar decompression surgery. Spondylitic changes in the visualized lower thoracic and lumbar spine. Negative for fracture or worrisome bone lesion. IMPRESSION: 1. Partial resolution of right lower quadrant collection post percutaneous drainage. The drain catheter is well positioned in the residual collection. 2. Enlarging right subphrenic and right upper quadrant gas and fluid collection. This would be approachable for percutaneous drainage if needed. Electronically Signed   By: Lucrezia Europe M.D.   On: 06/16/2018 13:35   Ct Image Guided Drainage By Percutaneous Catheter  Result Date: 06/17/2018 CLINICAL DATA:  Colon carcinoma, post partial colectomy. Enlarging right upper quadrant gas and fluid collection. EXAM: CT GUIDED DRAINAGE OF RIGHT UPPER QUADRANT PERITONEAL ABSCESS ANESTHESIA/SEDATION: None required PROCEDURE: The procedure, risks, benefits, and alternatives were explained to the patient. Questions regarding the procedure were encouraged and answered. The patient  understands and consents to the procedure. Select axial scans through the upper abdomen obtained. The collection was localized and an appropriate skin entry site was determined and marked. The operative field was prepped with chlorhexidinein a sterile fashion, and a sterile drape was applied covering the operative field. A sterile gown and sterile gloves were used for the procedure. Local anesthesia was provided with 1% Lidocaine. Under CT fluoroscopic guidance, a 19 gauge percutaneous entry needle was advanced to the collection. Gas could be aspirated. An Amplatz guidewire advanced easily, its position confirmed on CT. Tract dilated to facilitate placement of a 12 French pigtail drain catheter, directed cephalad towards the subphrenic component of the collection. A sample of the thin cloudy yellow aspirate was sent for Gram stain and culture. Catheter secured externally with 0 Prolene suture and StatLock and placed to gravity drain bag. The patient tolerated the procedure well. COMPLICATIONS: None immediate FINDINGS: Gas and fluid collection in the right upper anterior peritoneal cavity just inferior to the liver was again localized. 12 French pigtail drain catheter placed as above. Sample of the aspirate sent for Gram stain and culture. IMPRESSION: 1. Technically successful CT-guided right upper quadrant peritoneal abscess drain catheter placement. Electronically Signed   By: Lucrezia Europe M.D.   On: 06/17/2018 13:35   US Thoracentesis Asp Pleural Space W/img Guide  Result Date: 06/17/2018 INDICATION: Since surgery for colon cancer. Right pleural effusion. Request for diagnostic and therapeutic thoracentesis. EXAM: ULTRASOUND GUIDED RIGHT THORACENTESIS MEDICATIONS: 1% lidocaine 10 mL COMPLICATIONS: None immediate. PROCEDURE: An ultrasound guided thoracentesis was thoroughly discussed with the patient and questions answered. The benefits, risks, alternatives and complications were also discussed. The patient  understands and wishes to proceed with the procedure. Written consent was obtained. Ultrasound was performed to localize and mark an adequate pocket of fluid in the right chest. The area was then prepped and draped in the normal sterile fashion. 1% Lidocaine was used for local anesthesia. Under ultrasound guidance a 6 Fr Safe-T-Centesis catheter was introduced. Thoracentesis was performed. The catheter was removed and a dressing applied. FINDINGS: A total of approximately 1 L of clear yellow fluid was removed. Samples were sent to the laboratory  as requested by the clinical team. IMPRESSION: Successful ultrasound guided right thoracentesis yielding 1 L of pleural fluid. No pneumothorax on post-procedure chest x-ray. Read by: Gareth [place], PA-C Electronically Signed   By: Corrie Mckusick D.O.   On: 06/17/2018 10:13        Scheduled Meds: . acetylcysteine  4 mL Nebulization TID  . aspirin EC  81 mg Oral Daily  . budesonide (PULMICORT) nebulizer solution  0.25 mg Nebulization BID  . dextromethorphan-guaiFENesin  1 tablet Oral BID  . enoxaparin (LOVENOX) injection  40 mg Subcutaneous Q24H  . feeding supplement (ENSURE ENLIVE)  237 mL Oral Q24H  . furosemide  40 mg Intravenous Daily  . gabapentin  100 mg Oral BID  . ipratropium-albuterol  3 mL Nebulization TID  . metoprolol tartrate  25 mg Oral BID  . multivitamins with iron  1 tablet Oral Daily  . pantoprazole  40 mg Oral Daily  . polyethylene glycol  17 g Oral Daily  . pravastatin  40 mg Oral QHS  . saccharomyces boulardii  250 mg Oral BID  . sodium chloride flush  5 mL Intracatheter Q8H  . sodium chloride flush  5 mL Intracatheter Q8H   Continuous Infusions: . sodium chloride 10 mL/hr at 06/12/18 0449  . sodium chloride 10 mL/hr at 06/14/18 0400  . ampicillin-sulbactam (UNASYN) IV 3 g (06/18/18 1227)     LOS: 13 days     Georgette Shell, MD [facility]  If 7PM-7AM, please contact night-coverage [email or web address] Password  [facility] 06/18/2018, 12:36 PM

## 2018-06-18 NOTE — Progress Notes (Signed)
Progress Note: General Surgery Service   Assessment/Plan: Active Problems:   HLD (hyperlipidemia)   HTN (hypertension)   GERD   h/o Anemia due to blood loss, acute   Alcohol use/ h/o heavy abuse   s/p NSTEMI (non-ST elevated myocardial infarction); with subsequent urgent CABG  5/18 [facility])   Paroxysmal atrial fibrillation ([name])   Cancer of ascending colon [id number] s/p right colectomy 05/19/2018   Congestive heart failure (CHF) ([name])   Hypokalemia   Atrial fibrillation with RVR ([name])   Hypoalbuminemia   Subacute confusional state   CHF (congestive heart failure) ([name])   Peripheral edema   Post-operative state   Rupture of operation wound   General weakness   SOB (shortness of breath)  s/p   -continue abx -continue diet    LOS: 13 days  Chief Complaint/Subjective: Feels much better after drain, appetite improved  Objective: Vital signs in last 24 hours: Temp:  [97.8 F (36.6 C)-100.3 F (37.9 C)] 98.6 F (37 C) (09/08 0704) Pulse Rate:  [87-105] 104 (09/08 0704) Resp:  [16-26] 26 (09/08 0704) BP: (96-115)/(46-63) 115/63 (09/08 0704) SpO2:  [92 %-97 %] 97 % (09/08 0749) Last BM Date: 06/17/18  Intake/Output from previous day: 09/07 0701 - 09/08 0700 In: 1917 [P.O.:870; I.V.:707; IV Piggyback:300] Out: 370 [Drains:370] Intake/Output this shift: No intake/output data recorded.  Lungs: CTAB  Cardiovascular: RRR  Abd: soft, ATTP, RUQ drain with thick Jenison drainage, RLQ drain with scant clear output  Extremities: +edema  Neuro: AOx4  Lab Results: CBC  Recent Labs    06/17/18 0506 06/18/18 0430  WBC 17.9* 12.8*  HGB 9.5* 9.4*  HCT 30.6* 29.9*  PLT 291 269   BMET Recent Labs    06/17/18 0506 06/18/18 0430  NA 137 140  K 4.5 3.8  CL 98 99  CO2 31 32  GLUCOSE 146* 115*  BUN 20 27*  CREATININE 1.35* 1.37*  CALCIUM 8.4* 8.4*   PT/INR No results for input(s): LABPROT, INR in the last 72 hours. ABG Recent Labs    06/15/18 1046  PHART 7.315*    HCO3 33.0*    Studies/Results:  Anti-infectives: Anti-infectives (From admission, onward)   Start     Dose/Rate Route Frequency Ordered Stop   06/17/18 1200  Ampicillin-Sulbactam (UNASYN) 3 g in sodium chloride 0.9 % 100 mL IVPB     3 g 200 mL/hr over 30 Minutes Intravenous Every 6 hours 06/17/18 1148     06/10/18 1200  vancomycin (VANCOCIN) 1,500 mg in sodium chloride 0.9 % 500 mL IVPB  Status:  Discontinued     1,500 mg 250 mL/hr over 120 Minutes Intravenous Every 24 hours 06/09/18 1055 06/12/18 0819   06/09/18 1200  ceFEPIme (MAXIPIME) 1 g in sodium chloride 0.9 % 100 mL IVPB  Status:  Discontinued     1 g 200 mL/hr over 30 Minutes Intravenous Every 8 hours 06/09/18 1023 06/17/18 1148   06/09/18 1100  vancomycin (VANCOCIN) 2,000 mg in sodium chloride 0.9 % 500 mL IVPB     2,000 mg 250 mL/hr over 120 Minutes Intravenous  Once 06/09/18 1023 06/09/18 1349      Medications: Scheduled Meds: . acetylcysteine  4 mL Nebulization TID  . aspirin EC  81 mg Oral Daily  . budesonide (PULMICORT) nebulizer solution  0.25 mg Nebulization BID  . dextromethorphan-guaiFENesin  1 tablet Oral BID  . enoxaparin (LOVENOX) injection  40 mg Subcutaneous Q24H  . feeding supplement (ENSURE ENLIVE)  237 mL Oral Q24H  .  furosemide  40 mg Intravenous Daily  . gabapentin  100 mg Oral BID  . ipratropium-albuterol  3 mL Nebulization TID  . metoprolol tartrate  25 mg Oral BID  . multivitamins with iron  1 tablet Oral Daily  . pantoprazole  40 mg Oral Daily  . polyethylene glycol  17 g Oral Daily  . pravastatin  40 mg Oral QHS  . saccharomyces boulardii  250 mg Oral BID  . sodium chloride flush  5 mL Intracatheter Q8H  . sodium chloride flush  5 mL Intracatheter Q8H   Continuous Infusions: . sodium chloride 10 mL/hr at 06/12/18 0449  . sodium chloride 10 mL/hr at 06/14/18 0400  . ampicillin-sulbactam (UNASYN) IV 3 g (06/18/18 0525)   PRN Meds:.sodium chloride, acetaminophen, iopamidol, ipratropium,  ondansetron **OR** ondansetron (ZOFRAN) IV, traMADol  Mickeal Skinner, MD Pg# [phone] [facility]

## 2018-06-19 ENCOUNTER — Inpatient Hospital Stay (HOSPITAL_COMMUNITY): Payer: Medicare HMO

## 2018-06-19 DIAGNOSIS — K219 Gastro-esophageal reflux disease without esophagitis: Secondary | ICD-10-CM

## 2018-06-19 DIAGNOSIS — R079 Chest pain, unspecified: Secondary | ICD-10-CM

## 2018-06-19 DIAGNOSIS — R109 Unspecified abdominal pain: Secondary | ICD-10-CM

## 2018-06-19 LAB — CBC WITH DIFFERENTIAL/PLATELET
BASOS ABS: 0 10*3/uL (ref 0.0–0.1)
Basophils Relative: 0 %
Eosinophils Absolute: 0.1 10*3/uL (ref 0.0–0.7)
Eosinophils Relative: 1 %
HCT: 30.3 % — ABNORMAL LOW (ref 39.0–52.0)
Hemoglobin: 9.4 g/dL — ABNORMAL LOW (ref 13.0–17.0)
LYMPHS PCT: 6 %
Lymphs Abs: 0.5 10*3/uL — ABNORMAL LOW (ref 0.7–4.0)
MCH: 27.5 pg (ref 26.0–34.0)
MCHC: 31 g/dL (ref 30.0–36.0)
MCV: 88.6 fL (ref 78.0–100.0)
Monocytes Absolute: 0.5 10*3/uL (ref 0.1–1.0)
Monocytes Relative: 6 %
NEUTROS ABS: 7.2 10*3/uL (ref 1.7–7.7)
Neutrophils Relative %: 87 %
PLATELETS: 276 10*3/uL (ref 150–400)
RBC: 3.42 MIL/uL — AB (ref 4.22–5.81)
RDW: 14.8 % (ref 11.5–15.5)
WBC: 8.3 10*3/uL (ref 4.0–10.5)

## 2018-06-19 LAB — BASIC METABOLIC PANEL
ANION GAP: 10 (ref 5–15)
BUN: 22 mg/dL (ref 8–23)
CO2: 34 mmol/L — ABNORMAL HIGH (ref 22–32)
Calcium: 8.2 mg/dL — ABNORMAL LOW (ref 8.9–10.3)
Chloride: 95 mmol/L — ABNORMAL LOW (ref 98–111)
Creatinine, Ser: 0.99 mg/dL (ref 0.61–1.24)
GFR calc Af Amer: >60 mL/min (ref >60)
GLUCOSE: 126 mg/dL — AB (ref 70–99)
POTASSIUM: 4 mmol/L (ref 3.5–5.1)
Sodium: 139 mmol/L (ref 135–145)

## 2018-06-19 LAB — TROPONIN I

## 2018-06-19 MED ORDER — ENSURE ENLIVE PO LIQD
237.0000 mL | Freq: Three times a day (TID) | ORAL | Status: DC
  Administered 2018-06-19 – 2018-06-22 (×6): 237 mL via ORAL

## 2018-06-19 NOTE — Care Management Important Message (Signed)
Important Message  Patient Details  Name: Vincent Mcclure MRN: [id number] Date of Birth: 07-07-1932   Medicare Important Message Given:  Yes    Kerin Salen 06/19/2018, 12:28 [place] Message  Patient Details  Name: Vincent Mcclure MRN: [id number] Date of Birth: January 19, 1932   Medicare Important Message Given:  Yes    Kerin Salen 06/19/2018, 12:28 PM

## 2018-06-19 NOTE — Progress Notes (Signed)
PT Cancellation Note  Patient Details Name: NAMAN SPYCHALSKI MRN: [id number] DOB: 1932-07-15   Cancelled Treatment:    Reason Eval/Treat Not Completed: Medical issues which prohibited therapy Pt out of room for imaging.  RN reports extreme pain this morning and desaturations.  Will hold therapy today and check back as schedule permits.   Tierrah Anastos,KATHrine E 06/19/2018, 10:56 AM Carmelia Bake, PT, DPT 06/19/2018 Pager: [phone]

## 2018-06-19 NOTE — Progress Notes (Signed)
CSW following to assist with disposition. Pt planning to admit to SNF once stable for DC- has chosen [facility]. CSW updated facility today- SNF working to receive updated [place] authorization.   Sharren Bridge, MSW, LCSW Clinical Social Work 06/19/2018 [phone]

## 2018-06-19 NOTE — Progress Notes (Signed)
SLP Cancellation Note  Patient Details Name: [name] MRN: [id number] DOB: 08-21-1932   Cancelled treatment:       Reason Eval/Treat Not Completed: Other (comment);Medical issues which prohibited therapy(pt in xray for emergent imaging, will follow up)   Macario Golds 06/19/2018, 12:22 PM  Luanna Salk, [place] [facility] SLP [phone]

## 2018-06-19 NOTE — Progress Notes (Signed)
Referring Physician(s): Dr. Annie Main Chiu/Dr. Alphonsa Overall  Supervising Physician: Jacqulynn Cadet  Patient Status:  [name]  Chief Complaint: Follow-up RLQ drain placed 9/3 by Dr. Earleen Newport and RUQ drain placed 9/7 by Dr. Vernard Gambles  Subjective:  Vincent Mcclure is a 84 y.o. male with a past medical history of ETOH abuse, anemia, GERD, HLD, HTN, a.fib and most recently colon cancer with right colectomy on 05/19/18. Patient was d/ced from hospital on 8/20 following colectomy, he presented to his outpatient follow up appointment with surgery on 8/23 and was found to be very weak and congested - he was sent to [facility] ED for evaluation. CXR in ED showed pneumoperitoneum which was thought to be related to recent surgery. He was admitted from the ED for treatment of acute respiratory failure due to CHF exacerbation. He subsequently developed HCAP with sepsis and IV antibiotics were started on 8/30.   CT abdomen and pelvis performed on 9/2 showed evidence of bowel perforation/anastomic breakdown with large volume intraperitoneal free air fluid in the right upper quadrant as well as a large loculated fluid collection in the right anterior pelvis extending along the left pericolic gutter consistent with peritoneal abscess. IR was consulted for aspiration and drainage of peritoneal abscess and RLQ drain was placed on 9/3 by Dr. Earleen Newport.  Patient continued to complain of RLQ and epigastric pain in the following days and he continued to have worsening leukocytosis - repeat CT abdomen and pelvis was obtained on  9/6 which showed partial resolution of RLQ collection however there was enlargement of RUQ gas and fluid collection. IR was consulted again for RUQ drain placement which was performed on 9/7 by Dr. Vernard Gambles. Right sided diagnostic thoracentesis was also performed in IR on 9/7 due to increasing right side pleural effusion  Initially presented to patient's room today for follow up of both drains however per  patient's wife he had just been taken down for an x-ray due to complaints of sudden onset abdominal and chest pain which she states made him scream out in pain. Patient seen for exam after x-ray and reports that he feels better now but is still having a little pain. He states the pain started in his right groin area and then traveled to his epigastric region and left shoulder. He denies n/v, chills, diaphoresis, chest pain or syncope.  Allergies: No known allergies  Medications: Prior to Admission medications   Medication Sig Start Date End Date Taking? Authorizing Provider  acetaminophen (TYLENOL) 500 MG tablet Take 1 tablet (500 mg total) by mouth every 6 (six) hours as needed for mild pain. 05/30/18  Yes Rayburn, Floyce Stakes, PA-C  aspirin EC 81 MG EC tablet Take 1 tablet (81 mg total) by mouth daily. 02/25/17  Yes Conte, Tessa N, PA-C  furosemide (LASIX) 40 MG tablet Take 1 tablet (40 mg total) by mouth daily. 08/10/17 08/10/18 Yes Dorothy Spark, MD  gabapentin (NEURONTIN) 100 MG capsule Take 1 capsule (100 mg total) by mouth 2 (two) times daily. 05/30/18  Yes Rayburn, Claiborne Billings A, PA-C  metoprolol succinate (TOPROL XL) 50 MG 24 hr tablet Take 1 tablet (50 mg total) by mouth daily. Take with or immediately following a meal. 05/24/17  Yes Imogene Burn, PA-C  polyethylene glycol [facility]) packet Take 17 g by mouth daily. 05/30/18  Yes Rayburn, Claiborne Billings A, PA-C  pravastatin (PRAVACHOL) 40 MG tablet I po q hs Patient taking differently: Take 40 mg by mouth daily.  01/17/18  Yes  [name], DO  alum & mag hydroxide-simeth (MAALOX/MYLANTA) 200-200-20 MG/5ML suspension Take 30 mLs by mouth every 6 (six) hours as needed for indigestion (gas/bloating). Patient not taking: Reported on 06/02/2018 05/30/18   Rayburn, Claiborne Billings A, PA-C  ferrous sulfate 325 (65 FE) MG EC tablet Take 1 tablet (325 mg total) by mouth 3 (three) times daily with meals. Patient not taking: Reported on 06/02/2018 01/17/18   [name],  DO  traMADol (ULTRAM) 50 MG tablet Take 1 tablet (50 mg total) by mouth every 6 (six) hours as needed for moderate pain or severe pain. Patient not taking: Reported on 06/02/2018 05/30/18   Rayburn, Floyce Stakes, PA-C     Vital Signs: BP (!) 146/69   Pulse (!) 109   Temp 99.4 F (37.4 C) (Oral)   Resp 18   Ht 5\' 11"  (1.803 m)   Wt 220 lb 14.4 oz (100.2 kg)   SpO2 91%   BMI 30.81 kg/m   Physical Exam  Constitutional: He is oriented to person, place, and time. No distress.  Hard of hearing; watching TV during exam. Wife present.  HENT:  Head: Normocephalic.  Cardiovascular: Normal rate, regular rhythm and normal heart sounds.  Pulmonary/Chest: Effort normal. He has rales (bilateral; diffuse).  Abdominal: Soft. He exhibits no distension. There is no tenderness.  RUQ gravity bag without output presently, residual gas is present. RLQ without put presently. RUQ and RLQ insertion sites clean, dry, intact, no drainage noted on dressing. No pain on palpation of either site.  Neurological: He is alert and oriented to person, place, and time.  Skin: Skin is warm and dry. He is not diaphoretic.  Psychiatric: He has a normal mood and affect. His behavior is normal. Judgment and thought content normal.  Vitals reviewed.   Imaging: Dg Chest 1 View  Result Date: 06/18/2018 CLINICAL DATA:  Hypoxia EXAM: CHEST  1 VIEW COMPARISON:  June 17, 2017 FINDINGS: No pneumothorax. Stable mild cardiomegaly. Small effusions with underlying atelectasis. No overt edema or other abnormality. IMPRESSION: Mild cardiomegaly with small pleural effusions and underlying atelectasis. Electronically Signed   By: Dorise Bullion III M.D   On: 06/18/2018 07:17   Dg Chest 1 View  Result Date: 06/17/2018 CLINICAL DATA:  84 year old male status post right-sided thoracentesis EXAM: CHEST  1 VIEW COMPARISON:  06/16/2018 FINDINGS: Cardiomediastinal silhouette unchanged in size and contour. Surgical changes of prior median  sternotomy and CABG. Improved opacity at the right lung base, status post thoracentesis. No evidence of pneumothorax. Lucency associated with the right hemidiaphragm is favored to represent increasing subdiaphragmatic gas component within the abdomen, present on the most recent CT scan. Blunting of the left costophrenic angle. Patchy airspace opacities at the lung bases. IMPRESSION: Improved appearance of right-sided pleural fluid status post thoracentesis, with no complicating features. Trace left-sided pleural effusion. Electronically Signed   By: Corrie Mckusick D.O.   On: 06/17/2018 09:24   Ct Abdomen Pelvis W Contrast  Result Date: 06/16/2018 CLINICAL DATA:  F/u abdominal drain placed 9/3. Pt with recent surgery, Fever post-op "Colon cancer, abdominal fluid collection EXAM: CT ABDOMEN AND PELVIS WITH CONTRAST TECHNIQUE: Multidetector CT imaging of the abdomen and pelvis was performed using the standard protocol following bolus administration of intravenous contrast. CONTRAST:  139mL OMNIPAQUE IOHEXOL 300 MG/ML  SOLN COMPARISON:  06/13/2013 FINDINGS: Lower chest: Moderate right and trace left pleural effusions as before. Atelectasis/consolidation posteriorly in the visualized lung bases. No pneumothorax. Previous median sternotomy and CABG. Hepatobiliary: No focal liver abnormality  is seen. No gallstones, gallbladder wall thickening, or biliary dilatation. Pancreas: Unremarkable. No pancreatic ductal dilatation or surrounding inflammatory changes. Spleen: Punctate calcified granulomas.  Normal size and enhancement. Adrenals/Urinary Tract: Stable 2.2 cm left adrenal nodule. Right adrenal unremarkable. Stable left renal cyst. No hydronephrosis. Urinary bladder incompletely distended. Stomach/Bowel: Stomach is partially distended. Small bowel nondilated. Some circumferential wall thickening in nondilated small bowel loops in the right lateral abdomen below the gallbladder. Changes of right hemicolectomy. Residual  colon is nondistended. Vascular/Lymphatic: Heavy aortoiliac arterial calcifications without aneurysm. Portal vein patent. No abdominal or pelvic adenopathy. Reproductive: Mild prostatic enlargement. Other: Stable right lower quadrant pigtail drain catheter. Interval decrease in size of residual collection measuring approximately 7.8 x 5.1 x3 cm (previously 14.7 x 11.6 x8.9). There has been interval increase in size of loculated gas and fluid collection, right subphrenic extending anteriorly in the right upper quadrant, measuring approximately 16.6 x 7.1 x17.7cm (previously 16.2 x 3.4 x 14.9cm). No new intra-abdominal collections. Musculoskeletal: Previous lower lumbar decompression surgery. Spondylitic changes in the visualized lower thoracic and lumbar spine. Negative for fracture or worrisome bone lesion. IMPRESSION: 1. Partial resolution of right lower quadrant collection post percutaneous drainage. The drain catheter is well positioned in the residual collection. 2. Enlarging right subphrenic and right upper quadrant gas and fluid collection. This would be approachable for percutaneous drainage if needed. Electronically Signed   By: Lucrezia Europe M.D.   On: 06/16/2018 13:35   Ct Image Guided Drainage By Percutaneous Catheter  Result Date: 06/17/2018 CLINICAL DATA:  Colon carcinoma, post partial colectomy. Enlarging right upper quadrant gas and fluid collection. EXAM: CT GUIDED DRAINAGE OF RIGHT UPPER QUADRANT PERITONEAL ABSCESS ANESTHESIA/SEDATION: None required PROCEDURE: The procedure, risks, benefits, and alternatives were explained to the patient. Questions regarding the procedure were encouraged and answered. The patient understands and consents to the procedure. Select axial scans through the upper abdomen obtained. The collection was localized and an appropriate skin entry site was determined and marked. The operative field was prepped with chlorhexidinein a sterile fashion, and a sterile drape was  applied covering the operative field. A sterile gown and sterile gloves were used for the procedure. Local anesthesia was provided with 1% Lidocaine. Under CT fluoroscopic guidance, a 19 gauge percutaneous entry needle was advanced to the collection. Gas could be aspirated. An Amplatz guidewire advanced easily, its position confirmed on CT. Tract dilated to facilitate placement of a 12 French pigtail drain catheter, directed cephalad towards the subphrenic component of the collection. A sample of the thin cloudy yellow aspirate was sent for Gram stain and culture. Catheter secured externally with 0 Prolene suture and StatLock and placed to gravity drain bag. The patient tolerated the procedure well. COMPLICATIONS: None immediate FINDINGS: Gas and fluid collection in the right upper anterior peritoneal cavity just inferior to the liver was again localized. 12 French pigtail drain catheter placed as above. Sample of the aspirate sent for Gram stain and culture. IMPRESSION: 1. Technically successful CT-guided right upper quadrant peritoneal abscess drain catheter placement. Electronically Signed   By: Lucrezia Europe M.D.   On: 06/17/2018 13:35   US Thoracentesis Asp Pleural Space W/img Guide  Result Date: 06/17/2018 INDICATION: Since surgery for colon cancer. Right pleural effusion. Request for diagnostic and therapeutic thoracentesis. EXAM: ULTRASOUND GUIDED RIGHT THORACENTESIS MEDICATIONS: 1% lidocaine 10 mL COMPLICATIONS: None immediate. PROCEDURE: An ultrasound guided thoracentesis was thoroughly discussed with the patient and questions answered. The benefits, risks, alternatives and complications were also discussed. The patient understands  and wishes to proceed with the procedure. Written consent was obtained. Ultrasound was performed to localize and mark an adequate pocket of fluid in the right chest. The area was then prepped and draped in the normal sterile fashion. 1% Lidocaine was used for local anesthesia.  Under ultrasound guidance a 6 Fr Safe-T-Centesis catheter was introduced. Thoracentesis was performed. The catheter was removed and a dressing applied. FINDINGS: A total of approximately 1 L of clear yellow fluid was removed. Samples were sent to the laboratory as requested by the clinical team. IMPRESSION: Successful ultrasound guided right thoracentesis yielding 1 L of pleural fluid. No pneumothorax on post-procedure chest x-ray. Read by: Gareth Eagle, PA-C Electronically Signed   By: Corrie Mckusick D.O.   On: 06/17/2018 10:13    Labs:  CBC: Recent Labs    06/16/18 0413 06/17/18 0506 06/18/18 0430 06/19/18 0429  WBC 12.9* 17.9* 12.8* 8.3  HGB 9.8* 9.5* 9.4* 9.4*  HCT 32.0* 30.6* 29.9* 30.3*  PLT 308 291 269 276    COAGS: Recent Labs    06/02/18 1801 06/13/18 0957  INR 1.15 1.20  APTT  --  37*    BMP: Recent Labs    06/16/18 0413 06/17/18 0506 06/18/18 0430 06/19/18 0429  NA 141 137 140 139  K 4.2 4.5 3.8 4.0  CL 98 98 99 95*  CO2 34* 31 32 34*  GLUCOSE 119* 146* 115* 126*  BUN 19 20 27* 22  CALCIUM 8.6* 8.4* 8.4* 8.2*  CREATININE 1.22 1.35* 1.37* 0.99  GFRNONAA 53* 47* 46* >60  GFRAA >60 54* 53* >60    LIVER FUNCTION TESTS: Recent Labs    05/18/18 1851 06/02/18 1700 06/13/18 0438 06/14/18 0451  BILITOT 0.9 0.5 0.7 0.4  AST 28 47* 29 20  ALT 11 34 33 26  ALKPHOS 90 121 90 79  PROT 7.2 5.7* 5.3* 5.2*  ALBUMIN 4.2 2.3* 2.2* 1.9*    Assessment and Plan:  Follow up RLQ and RUQ drains placed in IR - patient with ongoing abdominal pain and respiratory complaints. Per wife patient was screaming in pain which radiated from abdomen to chest and was taken for abdominal and chest x-ray; report showed no acute processes. Patient feeling better on exam today - he states pain was radiating from lower abdomen to left shoulder and occurred after eating. He denies pain at either drain insertion site and none was illicited on palpation today.   WBC trending down on most  recent lab work (8.3 today), patient afebrile, RUQ drain with 190 cc output and RLQ 30 cc output recorded in previous 24 hours. No output on exam today as bags had just been emptied per patient, however earlier during surgery exam output from RUQ drain was reported to be feculent appearing with significant gas - there is concern for fistula given output appearance. Culture of RUQ output growing bacteroides fragilis, RLQ has had no growth to date. Unasyn was started on 9/7 to cover bacteroides fragilis.   Per patient's wife plan to d/c to rehab once stable. IR to continue to follow RUQ and RLQ drains; continue to BID flushes of both drains with 3-5 cc NS. Plan for repeat CT abdomen once output has been <10 cc for >48 hours prior to removal. IR will follow in outpatient drain clinic if d/ced to rehab before drains are ready to be removed. D/c per admitting service.  Electronically Signed: Joaquim Nam, PA-C 06/19/2018, 11:16 AM   I spent a total of 25 Minutes  at the the patient's bedside AND on the patient's hospital floor or unit, greater than 50% of which was counseling/coordinating care for RUQ and RLQ abscess drains.

## 2018-06-19 NOTE — Progress Notes (Signed)
Patient ID: Vincent Mcclure, male   DOB: 08-11-1932, 84 y.o.   MRN: [id number]       Subjective: Pt is very HOH.  No new complaints.  Still feels better after drains placed.  Eating some.  Objective: Vital signs in last 24 hours: Temp:  [98.6 F (37 C)-98.8 F (37.1 C)] 98.8 F (37.1 C) (09/09 0502) Pulse Rate:  [93-103] 93 (09/09 0502) Resp:  [18] 18 (09/09 0502) BP: (122-126)/(70-73) 122/73 (09/09 0502) SpO2:  [97 %-99 %] 99 % (09/09 0502) Weight:  [100.2 kg-100.3 kg] 100.2 kg (09/09 0502) Last BM Date: 06/17/18  Intake/Output from previous day: 09/08 0701 - 09/09 0700 In: 1462.7 [P.O.:887; I.V.:122.5; IV Piggyback:423.2] Out: 2370 [Urine:2150; Drains:220] Intake/Output this shift: No intake/output data recorded.  PE: Heart: regular Lungs: diffuse B rhonchi Abd: soft, ND, RUQ drain with feculent appearing [place] output with a bag that is tense from air, 190cc documented yesterday.  RLQ drain with minimal serous output.  30cc documented yesterday.  Midline wound is clean and packed.  +BS  Lab Results:  Recent Labs    06/18/18 0430 06/19/18 0429  WBC 12.8* 8.3  HGB 9.4* 9.4*  HCT 29.9* 30.3*  PLT 269 276   BMET Recent Labs    06/18/18 0430 06/19/18 0429  NA 140 139  K 3.8 4.0  CL 99 95*  CO2 32 34*  GLUCOSE 115* 126*  BUN 27* 22  CREATININE 1.37* 0.99  CALCIUM 8.4* 8.2*   PT/INR No results for input(s): LABPROT, INR in the last 72 hours. CMP     Component Value Date/Time   NA 139 06/19/2018 0429   NA 139 01/17/2018 1401   K 4.0 06/19/2018 0429   CL 95 (L) 06/19/2018 0429   CO2 34 (H) 06/19/2018 0429   GLUCOSE 126 (H) 06/19/2018 0429   BUN 22 06/19/2018 0429   BUN 13 01/17/2018 1401   CREATININE 0.99 06/19/2018 0429   CALCIUM 8.2 (L) 06/19/2018 0429   PROT 5.2 (L) 06/14/2018 0451   PROT 6.4 01/17/2018 1401   ALBUMIN 1.9 (L) 06/14/2018 0451   ALBUMIN 4.2 01/17/2018 1401   AST 20 06/14/2018 0451   ALT 26 06/14/2018 0451   ALKPHOS 79 06/14/2018  0451   BILITOT 0.4 06/14/2018 0451   BILITOT 0.3 01/17/2018 1401   GFRNONAA >60 06/19/2018 0429   GFRAA >60 06/19/2018 0429   Lipase     Component Value Date/Time   LIPASE 103 (H) 06/02/2018 1801       Studies/Results: Dg Chest 1 View  Result Date: 06/18/2018 CLINICAL DATA:  Hypoxia EXAM: CHEST  1 VIEW COMPARISON:  June 17, 2017 FINDINGS: No pneumothorax. Stable mild cardiomegaly. Small effusions with underlying atelectasis. No overt edema or other abnormality. IMPRESSION: Mild cardiomegaly with small pleural effusions and underlying atelectasis. Electronically Signed   By: Dorise Bullion III M.D   On: 06/18/2018 07:17   Dg Chest 1 View  Result Date: 06/17/2018 CLINICAL DATA:  84 year old male status post right-sided thoracentesis EXAM: CHEST  1 VIEW COMPARISON:  06/16/2018 FINDINGS: Cardiomediastinal silhouette unchanged in size and contour. Surgical changes of prior median sternotomy and CABG. Improved opacity at the right lung base, status post thoracentesis. No evidence of pneumothorax. Lucency associated with the right hemidiaphragm is favored to represent increasing subdiaphragmatic gas component within the abdomen, present on the most recent CT scan. Blunting of the left costophrenic angle. Patchy airspace opacities at the lung bases. IMPRESSION: Improved appearance of right-sided pleural fluid status post thoracentesis,  with no complicating features. Trace left-sided pleural effusion. Electronically Signed   By: Corrie Mckusick D.O.   On: 06/17/2018 09:24   Ct Image Guided Drainage By Percutaneous Catheter  Result Date: 06/17/2018 CLINICAL DATA:  Colon carcinoma, post partial colectomy. Enlarging right upper quadrant gas and fluid collection. EXAM: CT GUIDED DRAINAGE OF RIGHT UPPER QUADRANT PERITONEAL ABSCESS ANESTHESIA/SEDATION: None required PROCEDURE: The procedure, risks, benefits, and alternatives were explained to the patient. Questions regarding the procedure were encouraged  and answered. The patient understands and consents to the procedure. Select axial scans through the upper abdomen obtained. The collection was localized and an appropriate skin entry site was determined and marked. The operative field was prepped with chlorhexidinein a sterile fashion, and a sterile drape was applied covering the operative field. A sterile gown and sterile gloves were used for the procedure. Local anesthesia was provided with 1% Lidocaine. Under CT fluoroscopic guidance, a 19 gauge percutaneous entry needle was advanced to the collection. Gas could be aspirated. An Amplatz guidewire advanced easily, its position confirmed on CT. Tract dilated to facilitate placement of a 12 French pigtail drain catheter, directed cephalad towards the subphrenic component of the collection. A sample of the thin cloudy yellow aspirate was sent for Gram stain and culture. Catheter secured externally with 0 Prolene suture and StatLock and placed to gravity drain bag. The patient tolerated the procedure well. COMPLICATIONS: None immediate FINDINGS: Gas and fluid collection in the right upper anterior peritoneal cavity just inferior to the liver was again localized. 12 French pigtail drain catheter placed as above. Sample of the aspirate sent for Gram stain and culture. IMPRESSION: 1. Technically successful CT-guided right upper quadrant peritoneal abscess drain catheter placement. Electronically Signed   By: Lucrezia Europe M.D.   On: 06/17/2018 13:35   US Thoracentesis Asp Pleural Space W/img Guide  Result Date: 06/17/2018 INDICATION: Since surgery for colon cancer. Right pleural effusion. Request for diagnostic and therapeutic thoracentesis. EXAM: ULTRASOUND GUIDED RIGHT THORACENTESIS MEDICATIONS: 1% lidocaine 10 mL COMPLICATIONS: None immediate. PROCEDURE: An ultrasound guided thoracentesis was thoroughly discussed with the patient and questions answered. The benefits, risks, alternatives and complications were also  discussed. The patient understands and wishes to proceed with the procedure. Written consent was obtained. Ultrasound was performed to localize and mark an adequate pocket of fluid in the right chest. The area was then prepped and draped in the normal sterile fashion. 1% Lidocaine was used for local anesthesia. Under ultrasound guidance a 6 Fr Safe-T-Centesis catheter was introduced. Thoracentesis was performed. The catheter was removed and a dressing applied. FINDINGS: A total of approximately 1 L of clear yellow fluid was removed. Samples were sent to the laboratory as requested by the clinical team. IMPRESSION: Successful ultrasound guided right thoracentesis yielding 1 L of pleural fluid. No pneumothorax on post-procedure chest x-ray. Read by: Gareth Eagle, PA-C Electronically Signed   By: Corrie Mckusick D.O.   On: 06/17/2018 10:13    Anti-infectives: Anti-infectives (From admission, onward)   Start     Dose/Rate Route Frequency Ordered Stop   06/17/18 1200  Ampicillin-Sulbactam (UNASYN) 3 g in sodium chloride 0.9 % 100 mL IVPB     3 g 200 mL/hr over 30 Minutes Intravenous Every 6 hours 06/17/18 1148     06/10/18 1200  vancomycin (VANCOCIN) 1,500 mg in sodium chloride 0.9 % 500 mL IVPB  Status:  Discontinued     1,500 mg 250 mL/hr over 120 Minutes Intravenous Every 24 hours 06/09/18 1055 06/12/18 0819  06/09/18 1200  ceFEPIme (MAXIPIME) 1 g in sodium chloride 0.9 % 100 mL IVPB  Status:  Discontinued     1 g 200 mL/hr over 30 Minutes Intravenous Every 8 hours 06/09/18 1023 06/17/18 1148   06/09/18 1100  vancomycin (VANCOCIN) 2,000 mg in sodium chloride 0.9 % 500 mL IVPB     2,000 mg 250 mL/hr over 120 Minutes Intravenous  Once 06/09/18 1023 06/09/18 1349       Assessment/Plan History of alcohol use Hx of hypertension Hx of hyperlipidemia  Readmit 1/54/0086 Acute diastolicCHFexacerbation, HCAP Atrial fibrillation with history of RVR-no anticoagulation secondary to prior  bleeding Failure to thrive/deconditioning  Obstructing colon mass/adenocarcinoma 1/8positive nodes -S/P exploratory laparotomy, right hemicolectomy with primary anastomosis, 05/19/2018 - Dr. 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  Will need drain injection at some point. -Cefipime changed to unasyn this weekend.  CX shows nothing predominant, but gram +, gram -, and rare budding yeast.  Generally unasyn is not great coverage for gut at this hospital; however his WBC has normalized and he is AF.  Will discuss with Dr. Marcello Moores -cont midline dressing changes BID  FEN: HH diet, Ensure ID: , vancomycin 8/30>>9/2;  merrem 8/30 =>> 9/7, Unasyn 9/7 --> Follow up: Dr. Waynetta Pean, DO - PCP DVT: Lovenox   Plan:  cont to mobilize, cont current care.  Surgically stable at this time.  Drains will need to remain in place at this time.   LOS: 14 days    Henreitta Cea , [facility] 06/19/2018, 8:48 AM Pager: [phone]

## 2018-06-19 NOTE — Progress Notes (Signed)
PROGRESS NOTE    Vincent Mcclure  NUU:[id number] DOB: 08-Nov-1931 DOA: 06/02/2018 PCP: [name], DO   Brief Narrative: Vincent Mcclure is a 84 y.o. male with medical history significant for CAD, colon adenocarcinoma, history of alcohol abuse, A. fib, hypertension,status post ex lap and right colectomy with primary anastomosis on 8/9 due to adenocarcinoma. He presented secondary to abdominal pain.    Assessment & Plan:   Active Problems:   HLD (hyperlipidemia)   HTN (hypertension)   GERD   h/o Anemia due to blood loss, acute   Alcohol use/ h/o heavy abuse   s/p NSTEMI (non-ST elevated myocardial infarction); with subsequent urgent CABG  5/18 [facility])   Paroxysmal atrial fibrillation ([name])   Cancer of ascending colon [id number] s/p right colectomy 05/19/2018   Congestive heart failure (CHF) ([name])   Hypokalemia   Atrial fibrillation with RVR ([name])   Hypoalbuminemia   Subacute confusional state   CHF (congestive heart failure) ([name])   Peripheral edema   Post-operative state   Rupture of operation wound   General weakness   SOB (shortness of breath)   Acute respiratory failure with hypoxia and hypercarbia Secondary to CHF exacerbation and HCAP/COPD. S/p thoracentesis and diuresis. Treated with antibiotics. Event today with eventual escalation of oxygen requirements. -Wean oxygen as able  Severe chest/abdominal pain Event happened this morning. Unknown etiology. Initial concern for heart/lung/abdominal process. Acute chest/abdomen x-ray obtained which was unrevealing except for mild bibasilar pleural effusions. Troponin negative. EKG obtained and personally reviewed, significant for sinus arrhythmia and PVCs. -Cycle troponin  Acute on chronic diastolic Diuresed. Patient's weight appears stable. Currently on lasix IV -Continue Lasix  HCAP Treated with Completed course of cefepime. Received vancomycin earlier in admission.  Peritoneal abscess Patient is s/p drain placement on  9/3 and 9/7. Culture of 9/3 aspiration significant for bacteroides fragilis. Culture from 9/7 aspiration pending.  Pneumoperitoneum Secondary to colectomy. X-ray today without evidence.  Chronic atrial fibrillation Rate controlled. Not on anticoagulation secondary to recent GI bleeding. -Continue aspirin and metoprolol  Anemia of chronic disease Chronic and at baseline  Alcohol abuse Cessation discussed earlier in admission.   DVT prophylaxis: Lovenox Code Status:   Code Status: DNR Family Communication: Wife at bedside Disposition Plan: Discharge pending clinical improvement   Consultants:   General surgery  Interventional radiology  Procedures:   9/4: RLQ drain  9/7: Right thoracentesis  9/7: CT guided RUQ drain  Antimicrobials:  Vancomycin (8/30>>9/1)  Cefepime (8/30>>9/7)  Unasyn (9/7>>    Subjective: Severe chest pain this morning described as sharp and radiating from right lower abdomen, up through chest diagonally to left shoulder. Pain subsided prior to administration of Tramadol. This has not happened before and patient was not doing anything to illicit this pain.  Objective: Vitals:   06/19/18 0502 06/19/18 0906 06/19/18 0919 06/19/18 1002  BP: 122/73   (!) 146/69  Pulse: 93   (!) 109  Resp: 18     Temp: 98.8 F (37.1 C)   99.4 F (37.4 C)  TempSrc: Oral   Oral  SpO2: 99% 95% 95% 91%  Weight: 100.2 kg     Height:        Intake/Output Summary (Last 24 hours) at 06/19/2018 1029 Last data filed at 06/19/2018 0600 Gross per 24 hour  Intake 1102.72 ml  Output 2070 ml  Net -967.28 ml   Filed Weights   06/17/18 0500 06/18/18 0859 06/19/18 0502  Weight: 99.3 kg 100.3 kg 100.2 kg  Examination:  General exam: Appears calm and comfortable Respiratory system: Coarse sounds, decreased breath sounds, no wheezing. Some rhonchi. Normal respiratory effort Cardiovascular system: S1 & S2 heard, RRR. No murmurs, rubs, gallops or  clicks. Gastrointestinal system: Abdomen is nondistended, soft and nontender. No organomegaly or masses felt. Normal bowel sounds heard. Drain sites with intact dressings and no drainage. Central nervous system: Alert and oriented. No focal neurological deficits. Hard of hearing. Extremities: No edema. No calf tenderness Skin: No cyanosis. No rashes Psychiatry: Judgement and insight appear normal. Mood & affect appropriate.     Data Reviewed: I have personally reviewed following labs and imaging studies  CBC: Recent Labs  Lab 06/15/18 0443 06/16/18 0413 06/17/18 0506 06/18/18 0430 06/19/18 0429  WBC 8.4 12.9* 17.9* 12.8* 8.3  NEUTROABS 6.9 11.3* 15.9* 11.3* 7.2  HGB 10.2* 9.8* 9.5* 9.4* 9.4*  HCT 33.1* 32.0* 30.6* 29.9* 30.3*  MCV 89.9 89.9 89.0 89.0 88.6  PLT 304 308 291 269 937   Basic Metabolic Panel: Recent Labs  Lab 06/15/18 0443 06/16/18 0413 06/17/18 0506 06/18/18 0430 06/19/18 0429  NA 141 141 137 140 139  K 3.9 4.2 4.5 3.8 4.0  CL 101 98 98 99 95*  CO2 33* 34* 31 32 34*  GLUCOSE 124* 119* 146* 115* 126*  BUN 19 19 20  27* 22  CREATININE 0.91 1.22 1.35* 1.37* 0.99  CALCIUM 8.6* 8.6* 8.4* 8.4* 8.2*   GFR: Estimated Creatinine Clearance: 67 mL/min (by C-G formula based on SCr of 0.99 mg/dL). Liver Function Tests: Recent Labs  Lab 06/13/18 0438 06/14/18 0451  AST 29 20  ALT 33 26  ALKPHOS 90 79  BILITOT 0.7 0.4  PROT 5.3* 5.2*  ALBUMIN 2.2* 1.9*   No results for input(s): LIPASE, AMYLASE in the last 168 hours. No results for input(s): AMMONIA in the last 168 hours. Coagulation Profile: Recent Labs  Lab 06/13/18 0957  INR 1.20   Cardiac Enzymes: No results for input(s): CKTOTAL, CKMB, CKMBINDEX, TROPONINI in the last 168 hours. BNP (last 3 results) No results for input(s): PROBNP in the last 8760 hours. HbA1C: No results for input(s): HGBA1C in the last 72 hours. CBG: Recent Labs  Lab 06/14/18 2114  GLUCAP 140*   Lipid Profile: No  results for input(s): CHOL, HDL, LDLCALC, TRIG, CHOLHDL, LDLDIRECT in the last 72 hours. Thyroid Function Tests: No results for input(s): TSH, T4TOTAL, FREET4, T3FREE, THYROIDAB in the last 72 hours. Anemia Panel: No results for input(s): VITAMINB12, FOLATE, FERRITIN, TIBC, IRON, RETICCTPCT in the last 72 hours. Sepsis Labs: No results for input(s): PROCALCITON, LATICACIDVEN in the last 168 hours.  Recent Results (from the past 240 hour(s))  Culture, blood (x 2)     Status: None   Collection Time: 06/09/18 11:01 AM  Result Value Ref [place] Status   Specimen Description   Final    BLOOD RIGHT ANTECUBITAL Performed at [facility] [address] [place], [place] [address]    Special Requests   Final    BOTTLES DRAWN AEROBIC AND ANAEROBIC Blood Culture adequate volume Performed at [facility] [address], [place], [place] [address]    Culture   Final    NO GROWTH 5 DAYS Performed at [facility], [place] [address], [place],  [address]    Report Status 06/14/2018 FINAL  Final  Culture, blood (x 2)     Status: None   Collection Time: 06/09/18 11:06 AM  Result Value Ref [place] Status  Specimen Description   Final    BLOOD RIGHT FOREARM Performed at [facility] [address] [place], [place] [address]    Special Requests   Final    BOTTLES DRAWN AEROBIC AND ANAEROBIC Blood Culture adequate volume Performed at [facility] [address], [place], [place] [address]    Culture   Final    NO GROWTH 5 DAYS Performed at [facility], [place] [address], [place], [place] [address]    Report Status 06/14/2018 FINAL  Final  MRSA PCR Screening     Status: None   Collection Time: 06/11/18  1:48 PM  Result Value Ref [place] Status   MRSA by PCR NEGATIVE NEGATIVE Final    Comment:        The GeneXpert MRSA Assay (FDA approved for NASAL specimens only), is one component of a comprehensive  MRSA colonization surveillance program. It is not intended to diagnose MRSA infection nor to guide or monitor treatment for MRSA infections. Performed at [facility], [place] [address] [place], [place] [address]   Aerobic/Anaerobic Culture (surgical/deep wound)     Status: None   Collection Time: 06/13/18  5:10 PM  Result Value Ref [place] Status   Specimen Description   Final    ABDOMEN Performed at [facility] [address] [place], [place] [address]    Special Requests   Final    NONE Performed at [facility], [place] [address] [place], [place] [address]    Gram Stain   Final    ABUNDANT WBC PRESENT,BOTH PMN AND MONONUCLEAR FEW GRAM NEGATIVE RODS    Culture   Final    MODERATE BACTEROIDES FRAGILIS BETA LACTAMASE POSITIVE NO GROWTH  AEROBICALLY Performed at [facility], [place] [address], [place], [place] [address]    Report Status 06/16/2018 FINAL  Final  Urine Culture     Status: None   Collection Time: 06/16/18 10:30 AM  Result Value Ref [place] Status   Specimen Description   Final    URINE, CLEAN CATCH Performed at [facility], [place] [address], [place], [place] [address]    Special Requests   Final    NONE Performed at [facility], [place] [address] [place], [place] [address]    Culture   Final    NO GROWTH Performed at [facility], [place] [address] [place], [place] [address]    Report Status 06/17/2018 FINAL  Final  Body fluid culture     Status: None (Preliminary result)   Collection Time: 06/17/18  8:55 AM  Result Value Ref [place] Status   Specimen Description   Final    PLEURAL RIGHT Performed at [facility] [address] [place], [place] [address]    Special Requests   Final    NONE Performed at [facility], [place] [address] [place], [place] [address]    Gram Stain   Final    ABUNDANT WBC PRESENT,BOTH PMN  AND MONONUCLEAR NO ORGANISMS SEEN    Culture   Final    NO GROWTH < 24 HOURS Performed at [facility] [address] [place],  [address]    Report Status PENDING  Incomplete  Aerobic/Anaerobic Culture (surgical/deep wound)     Status: Abnormal (Preliminary result)   Collection Time: 06/17/18 10:24 AM  Result Value Ref [place] Status   Specimen Description   Final  ABSCESS RUQ Performed at [facility], [place] [address], [place], [place] [address]    Special Requests   Final    NONE Performed at [facility], [place] [address] [place], [place] [address]    Gram Stain   Final    ABUNDANT WBC PRESENT, PREDOMINANTLY PMN ABUNDANT GRAM NEGATIVE RODS MODERATE GRAM POSITIVE RODS FEW GRAM POSITIVE COCCI IN PAIRS RARE BUDDING YEAST SEEN Performed at [facility], [place] [address], [place], [place] [address]    Culture MULTIPLE ORGANISMS PRESENT, NONE PREDOMINANT (A)  Final   Report Status PENDING  Incomplete         Radiology Studies: Dg Chest 1 View  Result Date: 06/18/2018 CLINICAL DATA:  Hypoxia EXAM: CHEST  1 VIEW COMPARISON:  June 17, 2017 FINDINGS: No pneumothorax. Stable mild cardiomegaly. Small effusions with underlying atelectasis. No overt edema or other abnormality. IMPRESSION: Mild cardiomegaly with small pleural effusions and underlying atelectasis. Electronically Signed   By: Dorise Bullion III M.D   On: 06/18/2018 07:17   Ct Image Guided Drainage By Percutaneous Catheter  Result Date: 06/17/2018 CLINICAL DATA:  Colon carcinoma, post partial colectomy. Enlarging right upper quadrant gas and fluid collection. EXAM: CT GUIDED DRAINAGE OF RIGHT UPPER QUADRANT PERITONEAL ABSCESS ANESTHESIA/SEDATION: None required PROCEDURE: The procedure, risks, benefits, and alternatives were explained to the patient. Questions regarding the procedure were encouraged and answered. The patient understands and consents to the procedure.  Select axial scans through the upper abdomen obtained. The collection was localized and an appropriate skin entry site was determined and marked. The operative field was prepped with chlorhexidinein a sterile fashion, and a sterile drape was applied covering the operative field. A sterile gown and sterile gloves were used for the procedure. Local anesthesia was provided with 1% Lidocaine. Under CT fluoroscopic guidance, a 19 gauge percutaneous entry needle was advanced to the collection. Gas could be aspirated. An Amplatz guidewire advanced easily, its position confirmed on CT. Tract dilated to facilitate placement of a 12 French pigtail drain catheter, directed cephalad towards the subphrenic component of the collection. A sample of the thin cloudy yellow aspirate was sent for Gram stain and culture. Catheter secured externally with 0 Prolene suture and StatLock and placed to gravity drain bag. The patient tolerated the procedure well. COMPLICATIONS: None immediate FINDINGS: Gas and fluid collection in the right upper anterior peritoneal cavity just inferior to the liver was again localized. 12 French pigtail drain catheter placed as above. Sample of the aspirate sent for Gram stain and culture. IMPRESSION: 1. Technically successful CT-guided right upper quadrant peritoneal abscess drain catheter placement. Electronically Signed   By: Lucrezia Europe M.D.   On: 06/17/2018 13:35        Scheduled Meds: . acetylcysteine  4 mL Nebulization TID  . aspirin EC  81 mg Oral Daily  . budesonide (PULMICORT) nebulizer solution  0.25 mg Nebulization BID  . dextromethorphan-guaiFENesin  1 tablet Oral BID  . enoxaparin (LOVENOX) injection  40 mg Subcutaneous Q24H  . feeding supplement (ENSURE ENLIVE)  237 mL Oral Q24H  . furosemide  20 mg Intravenous Daily  . gabapentin  100 mg Oral BID  . ipratropium-albuterol  3 mL Nebulization TID  . metoprolol tartrate  25 mg Oral BID  . multivitamins with iron  1 tablet Oral  Daily  . pantoprazole  40 mg Oral Daily  . polyethylene glycol  17 g Oral Daily  . pravastatin  40 mg Oral QHS  .  saccharomyces boulardii  250 mg Oral BID  . sodium chloride flush  5 mL Intracatheter Q8H  . sodium chloride flush  5 mL Intracatheter Q8H   Continuous Infusions: . sodium chloride 10 mL/hr at 06/12/18 0449  . sodium chloride 10 mL/hr at 06/14/18 0400  . ampicillin-sulbactam (UNASYN) IV 200 mL/hr at 06/19/18 0600     LOS: 14 days     Cordelia Poche, MD [facility] 06/19/2018, 10:29 AM Pager: [phone]  If 7PM-7AM, please contact night-coverage [email or web address] 06/19/2018, 10:29 AM

## 2018-06-20 ENCOUNTER — Encounter: Payer: Self-pay | Admitting: *Deleted

## 2018-06-20 ENCOUNTER — Inpatient Hospital Stay (HOSPITAL_COMMUNITY): Payer: Medicare HMO

## 2018-06-20 ENCOUNTER — Encounter (HOSPITAL_COMMUNITY): Payer: Self-pay | Admitting: Radiology

## 2018-06-20 DIAGNOSIS — J9601 Acute respiratory failure with hypoxia: Secondary | ICD-10-CM

## 2018-06-20 LAB — BODY FLUID CULTURE: Culture: NO GROWTH

## 2018-06-20 LAB — PREALBUMIN: Prealbumin: <5 mg/dL — ABNORMAL LOW (ref 18–38)

## 2018-06-20 MED ORDER — IOHEXOL 300 MG/ML  SOLN
100.0000 mL | Freq: Once | INTRAMUSCULAR | Status: AC | PRN
  Administered 2018-06-20: 100 mL via INTRAVENOUS

## 2018-06-20 MED ORDER — DM-GUAIFENESIN ER 30-600 MG PO TB12
2.0000 | ORAL_TABLET | Freq: Two times a day (BID) | ORAL | Status: DC
  Administered 2018-06-20 – 2018-06-22 (×4): 2 via ORAL
  Filled 2018-06-20 (×4): qty 2

## 2018-06-20 NOTE — Progress Notes (Signed)
Pharmacy Antibiotic Note  Vincent Mcclure is a 84 y.o. male with right colonic mass s/p right hemicolectomy with primary anastomosis on 05/19/18 and was discharged from [facility] on 05/30/18.  She was sent to the ED from [place] office on 06/02/2018 for workup and treatment of suspected PNA.  Abd pelvis CT on 9/2 showed "bowel perforation/anastomosed breakdown with large volume intraperitoneal free air fluid in the RIGHT upper quadrant adjacent and above the liver. Large loculated fluid collection in the RIGHT anterior pelvis extending along the LEFT pericolic gutter consists with PERITONEAL ABSCESS."  He had RLQ drain placed on 9/3 and RUQ drain placed on 9/7.  He's currently on unasyn for bacteroides fragilis and abscess.  Today, 06/20/2018: - day #4 of unasyn.  CCS recommends to treat for 10 days - Tmax 100.1 - last scr 0.99 on 9/9 (crcl`66)   Plan: - continue Unasyn 3 gm IV q6h  - Pharmacy will sign off for unasyn.  Re-consult [place] if need further assistance ___________________________  Height: 5\' 11"  (180.3 cm) Weight: 217 lb 13 oz (98.8 kg) IBW/kg (Calculated) : 75.3  Temp (24hrs), Avg:99 F (37.2 C), Min:98.2 F (36.8 C), Max:100.1 F (37.8 C)  Recent Labs  Lab 06/15/18 0443 06/16/18 0413 06/17/18 0506 06/18/18 0430 06/19/18 0429  WBC 8.4 12.9* 17.9* 12.8* 8.3  CREATININE 0.91 1.22 1.35* 1.37* 0.99    Estimated Creatinine Clearance: 66.5 mL/min (by C-G formula based on SCr of 0.99 mg/dL).    Allergies  Allergen Reactions  . No Known Allergies     Antimicrobials this admission: 8/30 Vanc >> 9/2 8/30 Cefepime >>9/7 9/7 Unasyn>>  Microbiology results: 8/23 blood: ngf 8/24 urine: ngf 8/30 BCx: ngf 9/1 MRSA PCR: negative 9/3 abd abscess: few GNR; moderate bacteroides fragilis, beta lactamase positive F 9/7 abscess from drain: abud GNR, mod GPR, few GPC in prs, rare budding yeast 9/7 R pleural fluid:  9/6 Uxc: ngf  Thank you for allowing pharmacy to be a part of this patient's  care.  Dia Sitter, PharmD, BCPS 06/20/2018 8:42 AM

## 2018-06-20 NOTE — Progress Notes (Signed)
Patient ID: Vincent Mcclure, male   DOB: Aug 09, 1932, 84 y.o.   MRN: [id number]       Subjective: Patient with no new complaints today.  Eating some and drinking ensure.  No significant abdominal pain.  Objective: Vital signs in last 24 hours: Temp:  [98.2 F (36.8 C)-100.1 F (37.8 C)] 98.2 F (36.8 C) (09/10 0438) Pulse Rate:  [91-109] 91 (09/10 0438) Resp:  [16-24] 20 (09/10 0438) BP: (118-146)/(63-70) 122/70 (09/10 0438) SpO2:  [86 %-100 %] 100 % (09/10 0438) Weight:  [98.8 kg] 98.8 kg (09/10 0438) Last BM Date: 06/17/18  Intake/Output from previous day: 09/09 0701 - 09/10 0700 In: 1076.8 [P.O.:600; I.V.:200; IV Piggyback:276.8] Out: 600 [Urine:400; Drains:200] Intake/Output this shift: No intake/output data recorded.  PE: Heart: regular Lungs: bilateral rhonchi Abd: soft, NT, ND, +BS, RUQ drain with air and feculent output, 200 cc out yesterday.  RLQ with minimal output.  Midline wound is clean and packed  Lab Results:  Recent Labs    06/18/18 0430 06/19/18 0429  WBC 12.8* 8.3  HGB 9.4* 9.4*  HCT 29.9* 30.3*  PLT 269 276   BMET Recent Labs    06/18/18 0430 06/19/18 0429  NA 140 139  K 3.8 4.0  CL 99 95*  CO2 32 34*  GLUCOSE 115* 126*  BUN 27* 22  CREATININE 1.37* 0.99  CALCIUM 8.4* 8.2*   PT/INR No results for input(s): LABPROT, INR in the last 72 hours. CMP     Component Value Date/Time   NA 139 06/19/2018 0429   NA 139 01/17/2018 1401   K 4.0 06/19/2018 0429   CL 95 (L) 06/19/2018 0429   CO2 34 (H) 06/19/2018 0429   GLUCOSE 126 (H) 06/19/2018 0429   BUN 22 06/19/2018 0429   BUN 13 01/17/2018 1401   CREATININE 0.99 06/19/2018 0429   CALCIUM 8.2 (L) 06/19/2018 0429   PROT 5.2 (L) 06/14/2018 0451   PROT 6.4 01/17/2018 1401   ALBUMIN 1.9 (L) 06/14/2018 0451   ALBUMIN 4.2 01/17/2018 1401   AST 20 06/14/2018 0451   ALT 26 06/14/2018 0451   ALKPHOS 79 06/14/2018 0451   BILITOT 0.4 06/14/2018 0451   BILITOT 0.3 01/17/2018 1401   GFRNONAA >60  06/19/2018 0429   GFRAA >60 06/19/2018 0429   Lipase     Component Value Date/Time   LIPASE 103 (H) 06/02/2018 1801       Studies/Results: Dg Abd Acute W/chest  Result Date: 06/19/2018 CLINICAL DATA:  Abdominal pain EXAM: DG ABDOMEN ACUTE W/ 1V CHEST COMPARISON:  06/18/2018 FINDINGS: Cardiac shadow is mildly enlarged but stable. Postoperative changes are seen. Aortic calcifications are again identified. Small bilateral pleural effusions are noted. Right upper quadrant drainage catheter is again noted and stable. Scattered large and small bowel gas is seen. Right lower quadrant drainage catheter is also noted and stable. Degenerative changes of lumbar spine are seen. No free air is noted. No other focal abnormality is seen. IMPRESSION: Drainage catheters within the abdomen consistent with the given clinical history. Bilateral small effusions. Electronically Signed   By: Inez Catalina M.D.   On: 06/19/2018 11:18    Anti-infectives: Anti-infectives (From admission, onward)   Start     Dose/Rate Route Frequency Ordered Stop   06/17/18 1200  Ampicillin-Sulbactam (UNASYN) 3 g in sodium chloride 0.9 % 100 mL IVPB     3 g 200 mL/hr over 30 Minutes Intravenous Every 6 hours 06/17/18 1148     06/10/18 1200  vancomycin (VANCOCIN)  1,500 mg in sodium chloride 0.9 % 500 mL IVPB  Status:  Discontinued     1,500 mg 250 mL/hr over 120 Minutes Intravenous Every 24 hours 06/09/18 1055 06/12/18 0819   06/09/18 1200  ceFEPIme (MAXIPIME) 1 g in sodium chloride 0.9 % 100 mL IVPB  Status:  Discontinued     1 g 200 mL/hr over 30 Minutes Intravenous Every 8 hours 06/09/18 1023 06/17/18 1148   06/09/18 1100  vancomycin (VANCOCIN) 2,000 mg in sodium chloride 0.9 % 500 mL IVPB     2,000 mg 250 mL/hr over 120 Minutes Intravenous  Once 06/09/18 1023 06/09/18 1349       Assessment/Plan History of alcohol use Hx of hypertension Hx of hyperlipidemia  Readmit 1/59/4707 Acute diastolicCHFexacerbation,  HCAP Atrial fibrillation with history of RVR-no anticoagulation secondary to prior bleeding Failure to thrive/deconditioning  Obstructing colon mass/adenocarcinoma 1/8positive nodes -S/P exploratory laparotomy, right hemicolectomy with primary anastomosis, 05/19/2018 -Dr. Fanny Skates -Intra-abdominal abscess, secondary to anastomotic leak -s/p perc drain 9/3 in IR for RLQ and 9/7 for RUQ -RUQ drain with feculent output.  Upon further review of his imaging, this appears to be secondary to an anastomotic leak.  Cont drain for now and follow output to determine when this closes down -cont midline dressing changes BID -10 day course of abx therapy.  As he remains stable with a normal WBC, he can likely be transitioned to oral abx and DC to rehab for further drain care and follow up with [name] and [facility] clinic -unclear how much he is actually eating or drinking of his ensure.  Most recent albumin on 9-4 was 1.9. ? Need for calorie count given diagnosis of FTT in the recent past  FEN: HH diet, Ensure ID: , vancomycin 8/30>>9/2;merrem 8/30 =>>9/7, Unasyn 9/7 --> Follow up: Dr. Luna Kitchens clinic DVT: Lovenox  Plan: cont drains for anastomotic leak.  10 day course of abx total.  Will need follow up with [name] and drain clinic at discharge.   LOS: 15 days    Henreitta Cea , Ms [facility] 06/20/2018, 8:40 AM Pager: [phone]

## 2018-06-20 NOTE — Consult Note (Signed)
   [facility] Inpatient Consult   06/20/2018  Vincent Mcclure Oct 16, 1931 [id number]   [facility] follow up.   Went to bedside to speak with [name] and wife. [name] states "he is just not getting much better". Support and encouragement given to [name]. Made her aware that writer is still following for [facility] services post hospital discharge. [name] reports the plan discharge plan remains to be SNF at this time. Unsure of when [name] will discharge from hospital.   Spoke with inpatient [facility] after bedside visit. Discussed that [name] and wife could benefit from goals of care/ palliative care consult due to advanced age, co-morbidities, and decline.  Will continue to follow along for disposition plans and progression. Will make appropriate [facility] referrals upon hospital discharge.    Marthenia Rolling, MSN-Ed, RN,BSN [facility] Liaison [phone]

## 2018-06-20 NOTE — Progress Notes (Addendum)
Referring Physician(s): Dr. Annie Main Chiu/Dr. Alphonsa Overall  Supervising Physician: Aletta Edouard  Patient Status:  [facility] - In-pt  Chief Complaint: Follow-up RLQ drain placed 9/3 by Dr. Earleen Newport and RUQ drain placed 9/7 by Dr. Vernard Gambles  Subjective:  Vincent Mcclure a 84 y.o.malewith a past medical history of ETOH abuse, anemia, GERD, HLD, HTN, a.fib and most recently colon cancer with right colectomy on 05/19/18. Patient was d/ced from hospital on 8/20 following colectomy, he presented to his outpatient follow up appointment with surgery on 8/23 and was found to be very weak and congested - he was sent to [facility] ED for evaluation. CXR in ED showed pneumoperitoneum which was thought to be related to recent surgery. He was admitted from the ED for treatment of acute respiratory failure due to CHF exacerbation. He subsequently developed HCAP with sepsis and IV antibiotics were started on 8/30.   CT abdomen and pelvis performed on 9/2 showed evidence of bowel perforation/anastomic breakdown with large volume intraperitoneal free air fluid in the right upper quadrant as well as a large loculated fluid collection in the right anterior pelvis extending along the left pericolic gutter consistent with peritoneal abscess. IR was consulted for aspiration and drainage of peritoneal abscess and RLQ drain was placed on 9/3 by Dr. Earleen Newport.  Patient continued to complain of RLQ and epigastric pain in the following days and he continued to have worsening leukocytosis - repeat CT abdomen and pelvis was obtained on  9/6 which showed partial resolution of RLQ collection however there was enlargement of RUQ gas and fluid collection. IR was consulted again for RUQ drain placement which was performed on 9/7 by Dr. Vernard Gambles. Right sided diagnostic thoracentesis was also performed in IR on 9/7 due to increasing right side pleural effusion.  Patient reports some ongoing abdominal pain at midline incision site today. He states  his appetite is "pretty good" and denies any nausea or vomiting. He is awaiting word on when he can be d/ced to SNF.  Allergies: No known allergies  Medications: Prior to Admission medications   Medication Sig Start Date End Date Taking? Authorizing Provider  acetaminophen (TYLENOL) 500 MG tablet Take 1 tablet (500 mg total) by mouth every 6 (six) hours as needed for mild pain. 05/30/18  Yes Rayburn, Floyce Stakes, PA-C  aspirin EC 81 MG EC tablet Take 1 tablet (81 mg total) by mouth daily. 02/25/17  Yes Conte, Tessa N, PA-C  furosemide (LASIX) 40 MG tablet Take 1 tablet (40 mg total) by mouth daily. 08/10/17 08/10/18 Yes Dorothy Spark, MD  gabapentin (NEURONTIN) 100 MG capsule Take 1 capsule (100 mg total) by mouth 2 (two) times daily. 05/30/18  Yes Rayburn, Claiborne Billings A, PA-C  metoprolol succinate (TOPROL XL) 50 MG 24 hr tablet Take 1 tablet (50 mg total) by mouth daily. Take with or immediately following a meal. 05/24/17  Yes Imogene Burn, PA-C  polyethylene glycol [facility]) packet Take 17 g by mouth daily. 05/30/18  Yes Rayburn, Claiborne Billings A, PA-C  pravastatin (PRAVACHOL) 40 MG tablet I po q hs Patient taking differently: Take 40 mg by mouth daily.  01/17/18  Yes Opalski, Deborah, DO  alum & mag hydroxide-simeth (MAALOX/MYLANTA) 200-200-20 MG/5ML suspension Take 30 mLs by mouth every 6 (six) hours as needed for indigestion (gas/bloating). Patient not taking: Reported on 06/02/2018 05/30/18   Rayburn, Claiborne Billings A, PA-C  ferrous sulfate 325 (65 FE) MG EC tablet Take 1 tablet (325 mg total) by mouth 3 (three) times daily  with meals. Patient not taking: Reported on 06/02/2018 01/17/18   Vincent Dance, DO  traMADol (ULTRAM) 50 MG tablet Take 1 tablet (50 mg total) by mouth every 6 (six) hours as needed for moderate pain or severe pain. Patient not taking: Reported on 06/02/2018 05/30/18   Rayburn, Floyce Stakes, PA-C     Vital Signs: BP 122/70 (BP Location: Left Arm)   Pulse 91   Temp 98.2 F (36.8 C) (Oral)   Resp  20   Ht 5\' 11"  (1.803 m)   Wt 217 lb 13 oz (98.8 kg)   SpO2 96%   BMI 30.38 kg/m   Physical Exam  Constitutional: No distress.  HENT:  Head: Normocephalic.  Cardiovascular: Normal rate, regular rhythm and normal heart sounds.  Pulmonary/Chest: Effort normal. He has wheezes.  Abdominal: Soft. He exhibits no distension. There is tenderness (mild).  RUQ drain with 100 cc feculent output and gas in gravity bag; aspirates and flushes easily. RLQ drain with ~10 cc serosanguineous drainage in gravity bag; flushes easily, does not aspirate. Both insertion sites clean, dry, intact without discharge or erythema.  Neurological: He is alert.  Skin: Skin is warm and dry. He is not diaphoretic.  Psychiatric: He has a normal mood and affect. His behavior is normal.  Nursing note and vitals reviewed.   Imaging: Dg Chest 1 View  Result Date: 06/18/2018 CLINICAL DATA:  Hypoxia EXAM: CHEST  1 VIEW COMPARISON:  June 17, 2017 FINDINGS: No pneumothorax. Stable mild cardiomegaly. Small effusions with underlying atelectasis. No overt edema or other abnormality. IMPRESSION: Mild cardiomegaly with small pleural effusions and underlying atelectasis. Electronically Signed   By: Dorise Bullion III M.D   On: 06/18/2018 07:17   Dg Chest 1 View  Result Date: 06/17/2018 CLINICAL DATA:  84 year old male status post right-sided thoracentesis EXAM: CHEST  1 VIEW COMPARISON:  06/16/2018 FINDINGS: Cardiomediastinal silhouette unchanged in size and contour. Surgical changes of prior median sternotomy and CABG. Improved opacity at the right lung base, status post thoracentesis. No evidence of pneumothorax. Lucency associated with the right hemidiaphragm is favored to represent increasing subdiaphragmatic gas component within the abdomen, present on the most recent CT scan. Blunting of the left costophrenic angle. Patchy airspace opacities at the lung bases. IMPRESSION: Improved appearance of right-sided pleural fluid status  post thoracentesis, with no complicating features. Trace left-sided pleural effusion. Electronically Signed   By: Corrie Mckusick D.O.   On: 06/17/2018 09:24   Ct Abdomen Pelvis W Contrast  Result Date: 06/16/2018 CLINICAL DATA:  F/u abdominal drain placed 9/3. Pt with recent surgery, Fever post-op "Colon cancer, abdominal fluid collection EXAM: CT ABDOMEN AND PELVIS WITH CONTRAST TECHNIQUE: Multidetector CT imaging of the abdomen and pelvis was performed using the standard protocol following bolus administration of intravenous contrast. CONTRAST:  159mL OMNIPAQUE IOHEXOL 300 MG/ML  SOLN COMPARISON:  06/13/2013 FINDINGS: Lower chest: Moderate right and trace left pleural effusions as before. Atelectasis/consolidation posteriorly in the visualized lung bases. No pneumothorax. Previous median sternotomy and CABG. Hepatobiliary: No focal liver abnormality is seen. No gallstones, gallbladder wall thickening, or biliary dilatation. Pancreas: Unremarkable. No pancreatic ductal dilatation or surrounding inflammatory changes. Spleen: Punctate calcified granulomas.  Normal size and enhancement. Adrenals/Urinary Tract: Stable 2.2 cm left adrenal nodule. Right adrenal unremarkable. Stable left renal cyst. No hydronephrosis. Urinary bladder incompletely distended. Stomach/Bowel: Stomach is partially distended. Small bowel nondilated. Some circumferential wall thickening in nondilated small bowel loops in the right lateral abdomen below the gallbladder. Changes of right hemicolectomy. Residual  colon is nondistended. Vascular/Lymphatic: Heavy aortoiliac arterial calcifications without aneurysm. Portal vein patent. No abdominal or pelvic adenopathy. Reproductive: Mild prostatic enlargement. Other: Stable right lower quadrant pigtail drain catheter. Interval decrease in size of residual collection measuring approximately 7.8 x 5.1 x3 cm (previously 14.7 x 11.6 x8.9). There has been interval increase in size of loculated gas and  fluid collection, right subphrenic extending anteriorly in the right upper quadrant, measuring approximately 16.6 x 7.1 x17.7cm (previously 16.2 x 3.4 x 14.9cm). No new intra-abdominal collections. Musculoskeletal: Previous lower lumbar decompression surgery. Spondylitic changes in the visualized lower thoracic and lumbar spine. Negative for fracture or worrisome bone lesion. IMPRESSION: 1. Partial resolution of right lower quadrant collection post percutaneous drainage. The drain catheter is well positioned in the residual collection. 2. Enlarging right subphrenic and right upper quadrant gas and fluid collection. This would be approachable for percutaneous drainage if needed. Electronically Signed   By: Lucrezia Europe M.D.   On: 06/16/2018 13:35   Dg Abd Acute W/chest  Result Date: 06/19/2018 CLINICAL DATA:  Abdominal pain EXAM: DG ABDOMEN ACUTE W/ 1V CHEST COMPARISON:  06/18/2018 FINDINGS: Cardiac shadow is mildly enlarged but stable. Postoperative changes are seen. Aortic calcifications are again identified. Small bilateral pleural effusions are noted. Right upper quadrant drainage catheter is again noted and stable. Scattered large and small bowel gas is seen. Right lower quadrant drainage catheter is also noted and stable. Degenerative changes of lumbar spine are seen. No free air is noted. No other focal abnormality is seen. IMPRESSION: Drainage catheters within the abdomen consistent with the given clinical history. Bilateral small effusions. Electronically Signed   By: Inez Catalina M.D.   On: 06/19/2018 11:18   Ct Image Guided Drainage By Percutaneous Catheter  Result Date: 06/17/2018 CLINICAL DATA:  Colon carcinoma, post partial colectomy. Enlarging right upper quadrant gas and fluid collection. EXAM: CT GUIDED DRAINAGE OF RIGHT UPPER QUADRANT PERITONEAL ABSCESS ANESTHESIA/SEDATION: None required PROCEDURE: The procedure, risks, benefits, and alternatives were explained to the patient. Questions  regarding the procedure were encouraged and answered. The patient understands and consents to the procedure. Select axial scans through the upper abdomen obtained. The collection was localized and an appropriate skin entry site was determined and marked. The operative field was prepped with chlorhexidinein a sterile fashion, and a sterile drape was applied covering the operative field. A sterile gown and sterile gloves were used for the procedure. Local anesthesia was provided with 1% Lidocaine. Under CT fluoroscopic guidance, a 19 gauge percutaneous entry needle was advanced to the collection. Gas could be aspirated. An Amplatz guidewire advanced easily, its position confirmed on CT. Tract dilated to facilitate placement of a 12 French pigtail drain catheter, directed cephalad towards the subphrenic component of the collection. A sample of the thin cloudy yellow aspirate was sent for Gram stain and culture. Catheter secured externally with 0 Prolene suture and StatLock and placed to gravity drain bag. The patient tolerated the procedure well. COMPLICATIONS: None immediate FINDINGS: Gas and fluid collection in the right upper anterior peritoneal cavity just inferior to the liver was again localized. 12 French pigtail drain catheter placed as above. Sample of the aspirate sent for Gram stain and culture. IMPRESSION: 1. Technically successful CT-guided right upper quadrant peritoneal abscess drain catheter placement. Electronically Signed   By: Lucrezia Europe M.D.   On: 06/17/2018 13:35   US Thoracentesis Asp Pleural Space W/img Guide  Result Date: 06/17/2018 INDICATION: Since surgery for colon cancer. Right pleural effusion. Request for  diagnostic and therapeutic thoracentesis. EXAM: ULTRASOUND GUIDED RIGHT THORACENTESIS MEDICATIONS: 1% lidocaine 10 mL COMPLICATIONS: None immediate. PROCEDURE: An ultrasound guided thoracentesis was thoroughly discussed with the patient and questions answered. The benefits, risks,  alternatives and complications were also discussed. The patient understands and wishes to proceed with the procedure. Written consent was obtained. Ultrasound was performed to localize and mark an adequate pocket of fluid in the right chest. The area was then prepped and draped in the normal sterile fashion. 1% Lidocaine was used for local anesthesia. Under ultrasound guidance a 6 Fr Safe-T-Centesis catheter was introduced. Thoracentesis was performed. The catheter was removed and a dressing applied. FINDINGS: A total of approximately 1 L of clear yellow fluid was removed. Samples were sent to the laboratory as requested by the clinical team. IMPRESSION: Successful ultrasound guided right thoracentesis yielding 1 L of pleural fluid. No pneumothorax on post-procedure chest x-ray. Read by: Gareth Eagle, PA-C Electronically Signed   By: Corrie Mckusick D.O.   On: 06/17/2018 10:13    Labs:  CBC: Recent Labs    06/16/18 0413 06/17/18 0506 06/18/18 0430 06/19/18 0429  WBC 12.9* 17.9* 12.8* 8.3  HGB 9.8* 9.5* 9.4* 9.4*  HCT 32.0* 30.6* 29.9* 30.3*  PLT 308 291 269 276    COAGS: Recent Labs    06/02/18 1801 06/13/18 0957  INR 1.15 1.20  APTT  --  37*    BMP: Recent Labs    06/16/18 0413 06/17/18 0506 06/18/18 0430 06/19/18 0429  NA 141 137 140 139  K 4.2 4.5 3.8 4.0  CL 98 98 99 95*  CO2 34* 31 32 34*  GLUCOSE 119* 146* 115* 126*  BUN 19 20 27* 22  CALCIUM 8.6* 8.4* 8.4* 8.2*  CREATININE 1.22 1.35* 1.37* 0.99  GFRNONAA 53* 47* 46* >60  GFRAA >60 54* 53* >60    LIVER FUNCTION TESTS: Recent Labs    05/18/18 1851 06/02/18 1700 06/13/18 0438 06/14/18 0451  BILITOT 0.9 0.5 0.7 0.4  AST 28 47* 29 20  ALT 11 34 33 26  ALKPHOS 90 121 90 79  PROT 7.2 5.7* 5.3* 5.2*  ALBUMIN 4.2 2.3* 2.2* 1.9*    Assessment and Plan:  Follow up RLQ and RUQ drains placed in IR - patient with ongoing abdominal pain at midline incision but this appears to be improving. Minimal pain on palpation  during exam today. RUQ drain with ongoing feculent output and gas in gravity bag, flushes and aspirates easily. Per surgery likely due to anastomotic leak - plan to continue drain for now and follow up with their service as an outpatient. RLQ drain with minimal output on exam today, unable to aspirate but flushes easily. Most recent CT scan 9/6 which showed catheter tip well positioned and partial resolution of fluid collection.  WBC trending down on most recent lab work (8.3), patient afebrile, RUQ drain with 200 cc output in last 24 hours, RLQ output has not been recorded. RLQ drain unable to aspirate today after vigorous flushing - will attempt aspiration again tomorrow and discuss repeat imaging /drain removal with supervising physician today.   Plan per admitting service to d/c to SNF once stable. IR to continue to follow RUQ and RLQ drains; continue BID flushes of both drains with 3-5 cc NS and record output for both drains. Plan for repeat CT abdomen once output has been <10 cc for >48 hours prior to removal. IR will follow in outpatient drain clinic if d/ced to rehab before drains are  ready to be removed. D/c per admitting service.  Electronically Signed: Joaquim Nam, PA-C 06/20/2018, 9:29 AM   I spent a total of 25 Minutes at the the patient's bedside AND on the patient's hospital floor or unit, greater than 50% of which was counseling/coordinating care for RUQ and RLQ abdominal drains.

## 2018-06-20 NOTE — Progress Notes (Signed)
  Speech Language Pathology Treatment: Dysphagia  Patient Details Name: Vincent Mcclure MRN: [id number] DOB: 05/10/1932 Today's Date: 06/20/2018 Time: 0211-1735 SLP Time Calculation (min) (ACUTE ONLY): 11 min  Assessment / Plan / Recommendation Clinical Impression  Patient today seen to assure po tolerance and to educate him to compensation strategies for swallowing with respiratory conditions.  Observed pt consuming water via straw - swallow appeared timely clinically with no indication of airway compromise.  Pt denies dysphagia nor coughing with any intake. Using teach back and written tips, educated pt to aspiration mitigation strategies using pt teach back.  Suspect much improved swallowing due to his respiratory status improvement.  SLP to sign off, thanks for allowing me to participate in his care.    HPI HPI: 84 year old male admitted 06/02/18 with chest congestion, SOB, LE edema, weakness.. PMH: ETOH abuse, anemia, esophageal stricture, GERD, HLD, HTN. CXR = no evidence of acute abnormality, no evidence of PNA.      SLP Plan  All goals met;Discharge SLP treatment due to (comment)       Recommendations  Diet recommendations: Regular;Thin liquid Liquids provided via: Cup;Straw Medication Administration: Whole meds with liquid Supervision: Patient able to self feed Compensations: Minimize environmental distractions;Slow rate;Small sips/bites Postural Changes and/or Swallow Maneuvers: Seated upright 90 degrees;Upright 30-60 min after meal                Oral Care Recommendations: Oral care QID Follow up Recommendations: None SLP Visit Diagnosis: Dysphagia, unspecified (R13.10) Plan: All goals met;Discharge SLP treatment due to (comment)       GO                Macario Golds 06/20/2018, 1:15 PM  Luanna Salk, [place] [facility] SLP [phone]

## 2018-06-20 NOTE — Progress Notes (Signed)
OT Cancellation Note  Patient Details Name: Vincent Mcclure MRN: [id number] DOB: 18-Jan-1932   Cancelled Treatment:    Reason Eval/Treat Not Completed: Other (comment)  Pt working with PT- will check back on pt later in day or next day  Kari Baars, [place]  Payton Mccallum D 06/20/2018, 1:23 PM

## 2018-06-20 NOTE — Progress Notes (Addendum)
PROGRESS NOTE    Vincent Mcclure  GBT:[id number] DOB: 1932/08/22 DOA: 06/02/2018 PCP: [name], DO   Brief Narrative: Vincent Mcclure is a 84 y.o. male with medical history significant for CAD, colon adenocarcinoma, history of alcohol abuse, A. fib, hypertension,status post ex lap and right colectomy with primary anastomosis on 8/9 due to adenocarcinoma. He presented secondary to abdominal pain.    Assessment & Plan:   Active Problems:   HLD (hyperlipidemia)   HTN (hypertension)   GERD   h/o Anemia due to blood loss, acute   Alcohol use/ h/o heavy abuse   s/p NSTEMI (non-ST elevated myocardial infarction); with subsequent urgent CABG  5/18 [facility])   Paroxysmal atrial fibrillation ([name])   Cancer of ascending colon [id number] s/p right colectomy 05/19/2018   Congestive heart failure (CHF) ([name])   Hypokalemia   Atrial fibrillation with RVR ([name])   Hypoalbuminemia   Subacute confusional state   CHF (congestive heart failure) ([name])   Peripheral edema   Post-operative state   Rupture of operation wound   General weakness   SOB (shortness of breath)   Acute respiratory failure with hypoxia and hypercarbia Secondary to CHF exacerbation and HCAP/COPD. S/p thoracentesis and diuresis. Treated with antibiotics. Oxygen requirement up slightly. Productive cough. Already treated for HCAP. -Wean oxygen as able -Sputum culture  Severe chest/abdominal pain Event happened this morning. Unknown etiology. Initial concern for heart/lung/abdominal process. Acute chest/abdomen x-ray obtained which was unrevealing except for mild bibasilar pleural effusions. Troponin cycled and negative. EKG obtained and personally reviewed, significant for sinus arrhythmia and PVCs. No recurrent episodes.  Acute on chronic diastolic heart failure Diuresed. Patient's weight appears stable. Currently on lasix IV -Continue Lasix  HCAP Treated with Completed course of cefepime. Received vancomycin earlier in  admission.  Peritoneal abscess Patient is s/p drain placement on 9/3 and 9/7. Culture of 9/3 aspiration significant for bacteroides fragilis. Culture from 9/7 aspiration with multiple organisms. -General surgery/Interventional radiology recommendations: CT scan planned for today  Pneumoperitoneum Secondary to colectomy. X-ray today without evidence.  Chronic atrial fibrillation Rate controlled. Not on anticoagulation secondary to recent GI bleeding. -Continue aspirin and metoprolol  Anemia of chronic disease Chronic and at baseline  Alcohol abuse Cessation discussed earlier in admission.  Paroxysmal atrial fibrillation Rate with non-sustained tachycardia. -Continue metoprolol   DVT prophylaxis: Lovenox Code Status:   Code Status: DNR Family Communication: None at bedside Disposition Plan: Discharge pending clinical improvement   Consultants:   General surgery  Interventional radiology  Procedures:   9/4: RLQ drain  9/7: Right thoracentesis  9/7: CT guided RUQ drain  Antimicrobials:  Vancomycin (8/30>>9/1)  Cefepime (8/30>>9/7)  Unasyn (9/7>>    Subjective: No chest pain today.  Objective: Vitals:   06/20/18 0438 06/20/18 0853 06/20/18 1010 06/20/18 1249  BP: 122/70  123/60 122/79  Pulse: 91  95 (!) 108  Resp: 20  18   Temp: 98.2 F (36.8 C)  98.9 F (37.2 C)   TempSrc: Oral  Oral   SpO2: 100% 96% 97% 92%  Weight: 98.8 kg     Height:        Intake/Output Summary (Last 24 hours) at 06/20/2018 1309 Last data filed at 06/20/2018 1035 Gross per 24 hour  Intake 1096.84 ml  Output 725 ml  Net 371.84 ml   Filed Weights   06/18/18 0859 06/19/18 0502 06/20/18 0438  Weight: 100.3 kg 100.2 kg 98.8 kg    Examination:  General exam: Appears calm and comfortable Respiratory system:  Diffuse rhonchi. Respiratory effort normal. Cardiovascular system: S1 & S2 heard, RRR. No murmurs. Gastrointestinal system: Abdomen is nondistended, soft and  nontender. Normal bowel sounds heard. Central nervous system: Alert and oriented. No focal neurological deficits. Extremities: No calf tenderness Skin: No cyanosis. No rashes Psychiatry: Judgement and insight appear normal. Mood & affect appropriate.     Data Reviewed: I have personally reviewed following labs and imaging studies  CBC: Recent Labs  Lab 06/15/18 0443 06/16/18 0413 06/17/18 0506 06/18/18 0430 06/19/18 0429  WBC 8.4 12.9* 17.9* 12.8* 8.3  NEUTROABS 6.9 11.3* 15.9* 11.3* 7.2  HGB 10.2* 9.8* 9.5* 9.4* 9.4*  HCT 33.1* 32.0* 30.6* 29.9* 30.3*  MCV 89.9 89.9 89.0 89.0 88.6  PLT 304 308 291 269 161   Basic Metabolic Panel: Recent Labs  Lab 06/15/18 0443 06/16/18 0413 06/17/18 0506 06/18/18 0430 06/19/18 0429  NA 141 141 137 140 139  K 3.9 4.2 4.5 3.8 4.0  CL 101 98 98 99 95*  CO2 33* 34* 31 32 34*  GLUCOSE 124* 119* 146* 115* 126*  BUN 19 19 20  27* 22  CREATININE 0.91 1.22 1.35* 1.37* 0.99  CALCIUM 8.6* 8.6* 8.4* 8.4* 8.2*   GFR: Estimated Creatinine Clearance: 66.5 mL/min (by C-G formula based on SCr of 0.99 mg/dL). Liver Function Tests: Recent Labs  Lab 06/14/18 0451  AST 20  ALT 26  ALKPHOS 79  BILITOT 0.4  PROT 5.2*  ALBUMIN 1.9*   No results for input(s): LIPASE, AMYLASE in the last 168 hours. No results for input(s): AMMONIA in the last 168 hours. Coagulation Profile: No results for input(s): INR, PROTIME in the last 168 hours. Cardiac Enzymes: Recent Labs  Lab 06/19/18 1041 06/19/18 1611 06/19/18 2155  TROPONINI <0.03 <0.03 <0.03   BNP (last 3 results) No results for input(s): PROBNP in the last 8760 hours. HbA1C: No results for input(s): HGBA1C in the last 72 hours. CBG: Recent Labs  Lab 06/14/18 2114  GLUCAP 140*   Lipid Profile: No results for input(s): CHOL, HDL, LDLCALC, TRIG, CHOLHDL, LDLDIRECT in the last 72 hours. Thyroid Function Tests: No results for input(s): TSH, T4TOTAL, FREET4, T3FREE, THYROIDAB in the last  72 hours. Anemia Panel: No results for input(s): VITAMINB12, FOLATE, FERRITIN, TIBC, IRON, RETICCTPCT in the last 72 hours. Sepsis Labs: No results for input(s): PROCALCITON, LATICACIDVEN in the last 168 hours.  Recent Results (from the past 240 hour(s))  MRSA PCR Screening     Status: None   Collection Time: 06/11/18  1:48 PM  Result Value Ref Range Status   MRSA by PCR NEGATIVE NEGATIVE Final    Comment:        The GeneXpert MRSA Assay (FDA approved for NASAL specimens only), is one component of a comprehensive MRSA colonization surveillance program. It is not intended to diagnose MRSA infection nor to guide or monitor treatment for MRSA infections. Performed at [facility], [place] [address] [place], [place] [address]   Aerobic/Anaerobic Culture (surgical/deep wound)     Status: None   Collection Time: 06/13/18  5:10 PM  Result Value Ref Range Status   Specimen Description   Final    ABDOMEN Performed at [place] [address], [place], [place] [address]    Special Requests   Final    NONE Performed at [facility], [place] [address], [place], [place] [address]    Gram Stain   Final    ABUNDANT WBC PRESENT,BOTH PMN AND MONONUCLEAR FEW Lonell Grandchild  NEGATIVE RODS    Culture   Final    MODERATE BACTEROIDES FRAGILIS BETA LACTAMASE POSITIVE NO GROWTH  AEROBICALLY Performed at [facility], [place] [address], [place], [place] [address]    Report Status 06/16/2018 FINAL  Final  Urine Culture     Status: None   Collection Time: 06/16/18 10:30 AM  Result Value Ref Range Status   Specimen Description   Final    URINE, CLEAN CATCH Performed at [facility], [place] [address], [place], [place] [address]    Special Requests   Final    NONE Performed at [facility], [place] [address], [place], [place] [address]    Culture   Final    NO GROWTH Performed at [facility], [place] [address] [place], [place] [address]    Report Status 06/17/2018 FINAL  Final  Body fluid culture     Status: None (Preliminary result)   Collection Time: 06/17/18  8:55 AM  Result Value Ref Range Status   Specimen Description   Final    PLEURAL RIGHT Performed at [place] [address], [place], [place] [address]    Special Requests   Final    NONE Performed at [facility], [place] [address], [place], [place] [address]    Gram Stain   Final    ABUNDANT WBC PRESENT,BOTH PMN AND MONONUCLEAR NO ORGANISMS SEEN    Culture   Final    NO GROWTH 3 DAYS Performed at [facility], [place] [address] [place], [place] [address]    Report Status PENDING  Incomplete  Aerobic/Anaerobic Culture (surgical/deep wound)     Status: Abnormal (Preliminary result)   Collection Time: 06/17/18 10:24 AM  Result Value Ref Range Status   Specimen Description   Final    ABSCESS RUQ Performed at [place] [address], [place], [place] [address]    Special Requests   Final    NONE Performed at [name], [place] [address], [place], [place] [address]    Gram Stain   Final    ABUNDANT WBC PRESENT, PREDOMINANTLY PMN ABUNDANT GRAM NEGATIVE RODS MODERATE GRAM POSITIVE RODS FEW GRAM POSITIVE COCCI IN PAIRS RARE BUDDING YEAST SEEN Performed at [facility], [place] [address] [place], [place] [address]    Culture (A)  Final    MULTIPLE ORGANISMS PRESENT, NONE PREDOMINANT NO ANAEROBES ISOLATED; CULTURE IN PROGRESS FOR 5 DAYS    Report Status PENDING  Incomplete         Radiology Studies: Dg Abd Acute W/chest  Result Date: 06/19/2018 CLINICAL DATA:  Abdominal pain EXAM: DG ABDOMEN ACUTE W/ 1V CHEST COMPARISON:  06/18/2018 FINDINGS: Cardiac shadow is mildly enlarged but stable. Postoperative changes are seen. Aortic calcifications are again identified. Small bilateral pleural effusions are noted. Right upper  quadrant drainage catheter is again noted and stable. Scattered large and small bowel gas is seen. Right lower quadrant drainage catheter is also noted and stable. Degenerative changes of lumbar spine are seen. No free air is noted. No other focal abnormality is seen. IMPRESSION: Drainage catheters within the abdomen consistent with the given clinical history. Bilateral small effusions. Electronically Signed   By: Inez Catalina M.D.   On: 06/19/2018 11:18        Scheduled Meds: . acetylcysteine  4 mL Nebulization TID  . aspirin EC  81 mg Oral Daily  . budesonide (PULMICORT) nebulizer  solution  0.25 mg Nebulization BID  . dextromethorphan-guaiFENesin  2 tablet Oral BID  . enoxaparin (LOVENOX) injection  40 mg Subcutaneous Q24H  . feeding supplement (ENSURE ENLIVE)  237 mL Oral TID BM  . furosemide  20 mg Intravenous Daily  . gabapentin  100 mg Oral BID  . ipratropium-albuterol  3 mL Nebulization TID  . metoprolol tartrate  25 mg Oral BID  . multivitamins with iron  1 tablet Oral Daily  . pantoprazole  40 mg Oral Daily  . polyethylene glycol  17 g Oral Daily  . pravastatin  40 mg Oral QHS  . saccharomyces boulardii  250 mg Oral BID  . sodium chloride flush  5 mL Intracatheter Q8H  . sodium chloride flush  5 mL Intracatheter Q8H   Continuous Infusions: . sodium chloride 10 mL/hr at 06/12/18 0449  . sodium chloride 10 mL/hr at 06/14/18 0400  . ampicillin-sulbactam (UNASYN) IV 3 g (06/20/18 0450)     LOS: 15 days     Cordelia Poche, MD [facility] 06/20/2018, 1:09 PM Pager: [phone]  If 7PM-7AM, please contact night-coverage [email or web address] 06/20/2018, 1:09 PM

## 2018-06-20 NOTE — Progress Notes (Signed)
Physical Therapy Treatment Patient Details Name: Vincent Mcclure MRN: [id number] DOB: Jan 14, 1932 Today's Date: 06/20/2018    History of Present Illness 84 y.o. male with medical history significant of CAD s/p CABG in 2018, PAF not on anticoagulation due to h/o GIB in past, and s/p exp lap with colectomy 05/19/18.  He was admitted from his follow up surgical appt due to weakness and congestion.     PT Comments    Pt able to ambulate short distance with rest break today!  Pt remains on 6L O2 [place] however SPO2 remained in 90s during ambulation and HR 100-118 bpm.  Continue to recommend SNF at this time.  Follow Up Recommendations  SNF     Equipment Recommendations  None recommended by PT    Recommendations for Other Services       Precautions / Restrictions Precautions Precautions: Fall Precaution Comments: watch HR and sats; right sided drains    Mobility  Bed Mobility               General bed mobility comments: pt sitting EOB on arrival; just bathed with NT  Transfers Overall transfer level: Needs assistance Equipment used: Rolling walker (2 wheeled) Transfers: Sit to/from Stand Sit to Stand: Min assist         General transfer comment: verbal cues for technique, assist to rise and steady, assist from lower surface  Ambulation/Gait Ambulation/Gait assistance: Min assist;+2 safety/equipment Gait Distance (Feet): 14 Feet(x2) Assistive device: Rolling walker (2 wheeled) Gait Pattern/deviations: Step-through pattern;Decreased stride length;Wide base of support Gait velocity: decreased    General Gait Details: verbal cues for RW positioning and posture, pt reports moderate dyspnea - vitals obtained (in docflowsheets) during seated rest break; pt able to ambulate back to room, SpO2 94% on 6L O2 [place]   Stairs             Wheelchair Mobility    Modified Rankin (Stroke Patients Only)       Balance                                             Cognition Arousal/Alertness: Awake/alert Behavior During Therapy: WFL for tasks assessed/performed Overall Cognitive Status: Within Functional Limits for tasks assessed                                 General Comments: pleasant and cooperative      Exercises      General Comments        Pertinent Vitals/Pain Pain Assessment: Faces Pain Score: 7  Pain Location: abdomen near drain site/incision Pain Descriptors / Indicators: Grimacing;Tender Pain Intervention(s): Limited activity within patient's tolerance;Repositioned;Monitored during session    Home Living                      Prior Function            PT Goals (current goals can now be found in the care plan section) Progress towards PT goals: Progressing toward goals    Frequency    Min 3X/week      PT Plan Current plan remains appropriate    Co-evaluation              AM-PAC PT "6 Clicks" Daily Activity  Outcome Measure  Difficulty turning over in bed (including  adjusting bedclothes, sheets and blankets)?: A Little Difficulty moving from lying on back to sitting on the side of the bed? : A Lot Difficulty sitting down on and standing up from a chair with arms (e.g., wheelchair, bedside commode, etc,.)?: Unable Help needed moving to and from a bed to chair (including a wheelchair)?: A Little Help needed walking in hospital room?: A Lot Help needed climbing 3-5 steps with a railing? : Total 6 Click Score: 12    End of Session Equipment Utilized During Treatment: Gait belt;Oxygen Activity Tolerance: Patient limited by fatigue Patient left: in chair;with call bell/phone within reach   PT Visit Diagnosis: Muscle weakness (generalized) (M62.81);Difficulty in walking, not elsewhere classified (R26.2)     Time: 7127-8718 PT Time Calculation (min) (ACUTE ONLY): 26 min  Charges:  $Gait Training: 23-37 mins                    Carmelia Bake, PT, DPT 06/20/2018 Pager:  [phone]   [place] Ram E 06/20/2018, 3:06 PM

## 2018-06-21 LAB — CBC WITH DIFFERENTIAL/PLATELET
BASOS ABS: 0 10*3/uL (ref 0.0–0.1)
BASOS PCT: 0 %
EOS PCT: 1 %
Eosinophils Absolute: 0.1 10*3/uL (ref 0.0–0.7)
HCT: 28.3 % — ABNORMAL LOW (ref 39.0–52.0)
Hemoglobin: 8.8 g/dL — ABNORMAL LOW (ref 13.0–17.0)
LYMPHS PCT: 7 %
Lymphs Abs: 0.5 10*3/uL — ABNORMAL LOW (ref 0.7–4.0)
MCH: 27.3 pg (ref 26.0–34.0)
MCHC: 31.1 g/dL (ref 30.0–36.0)
MCV: 87.9 fL (ref 78.0–100.0)
Monocytes Absolute: 0.6 10*3/uL (ref 0.1–1.0)
Monocytes Relative: 7 %
Neutro Abs: 6.6 10*3/uL (ref 1.7–7.7)
Neutrophils Relative %: 85 %
PLATELETS: 297 10*3/uL (ref 150–400)
RBC: 3.22 MIL/uL — AB (ref 4.22–5.81)
RDW: 14.7 % (ref 11.5–15.5)
WBC: 7.8 10*3/uL (ref 4.0–10.5)

## 2018-06-21 LAB — EXPECTORATED SPUTUM ASSESSMENT W GRAM STAIN, RFLX TO RESP C: Special Requests: NORMAL

## 2018-06-21 LAB — BASIC METABOLIC PANEL
ANION GAP: 9 (ref 5–15)
BUN: 18 mg/dL (ref 8–23)
CO2: 35 mmol/L — ABNORMAL HIGH (ref 22–32)
CREATININE: 0.86 mg/dL (ref 0.61–1.24)
Calcium: 8.4 mg/dL — ABNORMAL LOW (ref 8.9–10.3)
Chloride: 95 mmol/L — ABNORMAL LOW (ref 98–111)
GLUCOSE: 104 mg/dL — AB (ref 70–99)
POTASSIUM: 4.2 mmol/L (ref 3.5–5.1)
SODIUM: 139 mmol/L (ref 135–145)

## 2018-06-21 LAB — EXPECTORATED SPUTUM ASSESSMENT W REFEX TO RESP CULTURE

## 2018-06-21 MED ORDER — FUROSEMIDE 40 MG PO TABS
40.0000 mg | ORAL_TABLET | Freq: Every day | ORAL | Status: DC
  Administered 2018-06-22: 40 mg via ORAL
  Filled 2018-06-21: qty 1

## 2018-06-21 NOTE — Progress Notes (Signed)
Occupational Therapy Treatment Patient Details Name: Vincent Mcclure MRN: [id number] DOB: [birth year] Today's Date: 06/21/2018    History of present illness 84 y.o. male with medical history significant of CAD s/p CABG in 2018, PAF not on anticoagulation due to h/o GIB in past, and s/p exp lap with colectomy 05/19/18.  He was admitted from his follow up surgical appt due to weakness and congestion.    OT comments  Pt agreeable to OOB and participative!  Follow Up Recommendations  SNF;Supervision/Assistance - 24 hour    Equipment Recommendations  3 in 1 bedside commode    Recommendations for Other Services      Precautions / Restrictions Precautions Precautions: Fall Precaution Comments: watch HR and sats; right sided drains       Mobility Bed Mobility Overal bed mobility: Needs Assistance Bed Mobility: Supine to Sit     Supine to sit: Max assist        Transfers Overall transfer level: Needs assistance   Transfers: Sit to/from Stand Sit to Stand: Mod assist;From elevated surface Stand pivot transfers: Mod assist;+2 safety/equipment            Balance Overall balance assessment: Needs assistance Sitting-balance support: No upper extremity supported;Feet supported Sitting balance-Leahy Scale: Good     Standing balance support: Bilateral upper extremity supported;During functional activity Standing balance-Leahy Scale: Fair Standing balance comment: reliant on UEs for dynamic activities                           ADL either performed or assessed with clinical judgement   ADL Overall ADL's : Needs assistance/impaired     Grooming: Min guard;Wash/dry hands;Standing;Wash/dry Sports administrator: Stand-pivot;Ambulation;Minimal assistance   Toileting- Clothing Manipulation and Hygiene: Moderate assistance;Sit to/from stand;Cueing for safety;Cueing for compensatory techniques       Functional mobility during ADLs: Minimal  assistance                 Cognition Arousal/Alertness: Awake/alert Behavior During Therapy: WFL for tasks assessed/performed Overall Cognitive Status: Within Functional Limits for tasks assessed                                 General Comments: pleasant and cooperative                   Pertinent Vitals/ Pain       Pain Score: 3  Pain Location: abdomen near drain site/incision Pain Descriptors / Indicators: Grimacing;Tender Pain Intervention(s): Limited activity within patient's tolerance;Repositioned     Prior Functioning/Environment              Frequency  Min 2X/week        Progress Toward Goals  OT Goals(current goals can now be found in the care plan section)  Progress towards OT goals: Progressing toward goals  Acute Rehab OT Goals Patient Stated Goal: get stronger OT Goal Formulation: With patient Time For Goal Achievement: 06/19/18 Potential to Achieve Goals: Good  Plan Discharge plan remains appropriate       AM-PAC PT "6 Clicks" Daily Activity     Outcome Measure   Help from another person eating meals?: None Help from another person taking care of personal grooming?: A Little Help from another person toileting, which includes using toliet, bedpan, or urinal?: A  Little Help from another person bathing (including washing, rinsing, drying)?: A Lot Help from another person to put on and taking off regular upper body clothing?: A Little Help from another person to put on and taking off regular lower body clothing?: A Lot 6 Click Score: 17    End of Session Equipment Utilized During Treatment: Gait belt  OT Visit Diagnosis: Muscle weakness (generalized) (M62.81)   Activity Tolerance Patient tolerated treatment well;Patient limited by fatigue   Patient Left with call bell/phone within reach;with bed alarm set;in chair;with chair alarm set(spouse stepped out of room for session)   Nurse Communication Mobility status         Time: 1125-1150 OT Time Calculation (min): 25 min  Charges: OT General Charges $OT Visit: 1 Visit OT Treatments $Self Care/Home Management : 23-37 mins  [place], [place] [phone]   Betsy Pries 06/21/2018, 5:50 PM

## 2018-06-21 NOTE — Progress Notes (Signed)
Referring Physician(s): Chiu,S/Newman,D  Supervising Physician: Jacqulynn Cadet  Patient Status:  [facility] - In-pt  Chief Complaint:  Abdominal fluid collections/abscesses  Subjective: Pt feeling a little better since RUQ drain placed; denies N/V;  denies worsening resp symptoms   Allergies: No known allergies  Medications: Prior to Admission medications   Medication Sig Start Date End Date Taking? Authorizing Provider  acetaminophen (TYLENOL) 500 MG tablet Take 1 tablet (500 mg total) by mouth every 6 (six) hours as needed for mild pain. 05/30/18  Yes Rayburn, Floyce Stakes, PA-C  aspirin EC 81 MG EC tablet Take 1 tablet (81 mg total) by mouth daily. 02/25/17  Yes Conte, Tessa N, PA-C  furosemide (LASIX) 40 MG tablet Take 1 tablet (40 mg total) by mouth daily. 08/10/17 08/10/18 Yes Dorothy Spark, MD  gabapentin (NEURONTIN) 100 MG capsule Take 1 capsule (100 mg total) by mouth 2 (two) times daily. 05/30/18  Yes Rayburn, Claiborne Billings A, PA-C  metoprolol succinate (TOPROL XL) 50 MG 24 hr tablet Take 1 tablet (50 mg total) by mouth daily. Take with or immediately following a meal. 05/24/17  Yes Imogene Burn, PA-C  polyethylene glycol [facility]) packet Take 17 g by mouth daily. 05/30/18  Yes Rayburn, Claiborne Billings A, PA-C  pravastatin (PRAVACHOL) 40 MG tablet I po q hs Patient taking differently: Take 40 mg by mouth daily.  01/17/18  Yes Opalski, Deborah, DO  alum & mag hydroxide-simeth (MAALOX/MYLANTA) 200-200-20 MG/5ML suspension Take 30 mLs by mouth every 6 (six) hours as needed for indigestion (gas/bloating). Patient not taking: Reported on 06/02/2018 05/30/18   Rayburn, Claiborne Billings A, PA-C  ferrous sulfate 325 (65 FE) MG EC tablet Take 1 tablet (325 mg total) by mouth 3 (three) times daily with meals. Patient not taking: Reported on 06/02/2018 01/17/18   [name], DO  traMADol (ULTRAM) 50 MG tablet Take 1 tablet (50 mg total) by mouth every 6 (six) hours as needed for moderate pain or severe  pain. Patient not taking: Reported on 06/02/2018 05/30/18   Rayburn, Floyce Stakes, PA-C     Vital Signs: BP 116/65 (BP Location: Left Arm)   Pulse 97   Temp 98.7 F (37.1 C) (Oral)   Resp 18   Ht 5\' 11"  (1.803 m)   Wt 220 lb 7.4 oz (100 kg)   SpO2 96%   BMI 30.75 kg/m   Physical Exam rt sided abd drains intact, insertion sites ok, mildly tender; both drains irrigated with only return of fluid from RUQ drain  Imaging: Dg Chest 1 View  Result Date: 06/18/2018 CLINICAL DATA:  Hypoxia EXAM: CHEST  1 VIEW COMPARISON:  June 17, 2017 FINDINGS: No pneumothorax. Stable mild cardiomegaly. Small effusions with underlying atelectasis. No overt edema or other abnormality. IMPRESSION: Mild cardiomegaly with small pleural effusions and underlying atelectasis. Electronically Signed   By: Dorise Bullion III M.D   On: 06/18/2018 07:17   Ct Abdomen Pelvis W Contrast  Result Date: 06/20/2018 CLINICAL DATA:  Anastomotic leak after right hemicolectomy and status post placement right pelvic percutaneous drainage catheter into large abscess on 06/13/2018 and additional right upper quadrant percutaneous drain in to large cavity predominantly containing air on 06/17/2018. The right pelvic drainage catheter shows diminished output clinically. The right upper quadrant drainage catheter continues to have feculent output. EXAM: CT ABDOMEN AND PELVIS WITH CONTRAST TECHNIQUE: Multidetector CT imaging of the abdomen and pelvis was performed using the standard protocol following bolus administration of intravenous contrast. CONTRAST:  138mL OMNIPAQUE IOHEXOL 300  MG/ML  SOLN COMPARISON:  06/16/2018 FINDINGS: Lower chest: There is a small right pleural effusion and a trace amount of left pleural fluid. Bibasilar atelectasis present. Hepatobiliary: No focal liver abnormality is seen. No gallstones, gallbladder wall thickening, or biliary dilatation. Pancreas: Unremarkable. No pancreatic ductal dilatation or surrounding  inflammatory changes. Spleen: Normal in size.  Stable calcified granulomata. Adrenals/Urinary Tract: Adrenal glands are unremarkable. Kidneys are normal, without renal calculi, focal lesion, or hydronephrosis. Bladder is unremarkable. Stomach/Bowel: No evidence of bowel obstruction. Vascular/Lymphatic: No significant vascular findings are present. No enlarged abdominal or pelvic lymph nodes. Reproductive: Prostate is unremarkable. Other: At the level of the indwelling right upper quadrant percutaneous drain, the collection of predominantly air as well as fluid present previously has been decompressed. There remains a tiny rim of fluid as well as small bubbles of extraluminal air that extend to abut the superior liver. At the level of the right pelvic drainage catheter, the residual component of abscess shows some decrease in size since the prior study on 09/06 with some fluid remaining around the pigtail portion of the drainage catheter. The area of fluid now measures roughly 4 x 6 cm compared to 5 x 8 cm previously. No new abscess identified. Musculoskeletal: No acute or significant osseous findings. IMPRESSION: 1. Decompression of right upper quadrant air and fluid collection after percutaneous catheter drainage. There remains a tiny rim of fluid as well as small extraluminal gas bubbles in this region suspicious for continued leak from bowel. 2. Further decrease in size of residual abscess fluid collection in the pelvis after percutaneous catheter drainage. There remains some fluid around the drainage catheter in the right pelvis. Electronically Signed   By: Aletta Edouard M.D.   On: 06/20/2018 17:26   Dg Abd Acute W/chest  Result Date: 06/19/2018 CLINICAL DATA:  Abdominal pain EXAM: DG ABDOMEN ACUTE W/ 1V CHEST COMPARISON:  06/18/2018 FINDINGS: Cardiac shadow is mildly enlarged but stable. Postoperative changes are seen. Aortic calcifications are again identified. Small bilateral pleural effusions are noted.  Right upper quadrant drainage catheter is again noted and stable. Scattered large and small bowel gas is seen. Right lower quadrant drainage catheter is also noted and stable. Degenerative changes of lumbar spine are seen. No free air is noted. No other focal abnormality is seen. IMPRESSION: Drainage catheters within the abdomen consistent with the given clinical history. Bilateral small effusions. Electronically Signed   By: Inez Catalina M.D.   On: 06/19/2018 11:18   Ct Image Guided Drainage By Percutaneous Catheter  Result Date: 06/17/2018 CLINICAL DATA:  Colon carcinoma, post partial colectomy. Enlarging right upper quadrant gas and fluid collection. EXAM: CT GUIDED DRAINAGE OF RIGHT UPPER QUADRANT PERITONEAL ABSCESS ANESTHESIA/SEDATION: None required PROCEDURE: The procedure, risks, benefits, and alternatives were explained to the patient. Questions regarding the procedure were encouraged and answered. The patient understands and consents to the procedure. Select axial scans through the upper abdomen obtained. The collection was localized and an appropriate skin entry site was determined and marked. The operative field was prepped with chlorhexidinein a sterile fashion, and a sterile drape was applied covering the operative field. A sterile gown and sterile gloves were used for the procedure. Local anesthesia was provided with 1% Lidocaine. Under CT fluoroscopic guidance, a 19 gauge percutaneous entry needle was advanced to the collection. Gas could be aspirated. An Amplatz guidewire advanced easily, its position confirmed on CT. Tract dilated to facilitate placement of a 12 French pigtail drain catheter, directed cephalad towards the subphrenic component  of the collection. A sample of the thin cloudy yellow aspirate was sent for Gram stain and culture. Catheter secured externally with 0 Prolene suture and StatLock and placed to gravity drain bag. The patient tolerated the procedure well. COMPLICATIONS: None  immediate FINDINGS: Gas and fluid collection in the right upper anterior peritoneal cavity just inferior to the liver was again localized. 12 French pigtail drain catheter placed as above. Sample of the aspirate sent for Gram stain and culture. IMPRESSION: 1. Technically successful CT-guided right upper quadrant peritoneal abscess drain catheter placement. Electronically Signed   By: Lucrezia Europe M.D.   On: 06/17/2018 13:35    Labs:  CBC: Recent Labs    06/17/18 0506 06/18/18 0430 06/19/18 0429 06/21/18 0422  WBC 17.9* 12.8* 8.3 7.8  HGB 9.5* 9.4* 9.4* 8.8*  HCT 30.6* 29.9* 30.3* 28.3*  PLT 291 269 276 297    COAGS: Recent Labs    06/02/18 1801 06/13/18 0957  INR 1.15 1.20  APTT  --  37*    BMP: Recent Labs    06/17/18 0506 06/18/18 0430 06/19/18 0429 06/21/18 0422  NA 137 140 139 139  K 4.5 3.8 4.0 4.2  CL 98 99 95* 95*  CO2 31 32 34* 35*  GLUCOSE 146* 115* 126* 104*  BUN 20 27* 22 18  CALCIUM 8.4* 8.4* 8.2* 8.4*  CREATININE 1.35* 1.37* 0.99 0.86  GFRNONAA 47* 46* >60 >60  GFRAA 54* 53* >60 >60    LIVER FUNCTION TESTS: Recent Labs    05/18/18 1851 06/02/18 1700 06/13/18 0438 06/14/18 0451  BILITOT 0.9 0.5 0.7 0.4  AST 28 47* 29 20  ALT 11 34 33 26  ALKPHOS 90 121 90 79  PROT 7.2 5.7* 5.3* 5.2*  ALBUMIN 4.2 2.3* 2.2* 1.9*    Assessment and Plan: Pt with hx colon ca, s/p rt colectomy 05/19/18; now with RLQ peritoneal fluid collection, s/p drain placement 9/3; s/p placement of right upper quadrant drain into air/fluid collection on 06/17/2018; afebrile, creatinine normal, WBC normal, hemoglobin 8.8, down slightly from 9.4; follow-up CT scan from yesterday revealed decompression of right upper quadrant air and fluid collection with tiny rim of fluid/gas bubbles remaining; decrease in size of fluid collection in right lower quadrant.  There remains some fluid around the RLQ drainage catheter.  Plan at this time is to keep both drains in place and continue with  drain irrigation.  Recommend follow-up CT in 1 week.  If patient discharged with drains recommend once daily irrigation with 10 cc sterile normal saline, output recording and dressing changes every 1 to 2 days.  Patient can follow-up in IR drain clinic.    Electronically Signed: D. Rowe Robert, PA-C 06/21/2018, 10:31 AM   I spent a total of 15 minutes at the the patient's bedside AND on the patient's hospital floor or unit, greater than 50% of which was counseling/coordinating care for abdominal fluid collection drains    Patient ID: Vincent Mcclure, male   DOB: May 16, 1932, 84 y.o.   MRN: [id number]

## 2018-06-21 NOTE — Progress Notes (Signed)
Patient ID: Vincent Mcclure, male   DOB: 04-22-1932, 84 y.o.   MRN: [id number]       Subjective: Pt very sleepy this morning.  States he ate well yesterday and had multiple BMs after eating so much.  He otherwise has no complaints.  Objective: Vital signs in last 24 hours: Temp:  [98.6 F (37 C)-99.2 F (37.3 C)] 98.7 F (37.1 C) (09/11 0434) Pulse Rate:  [89-108] 97 (09/11 0434) Resp:  [16-18] 18 (09/11 0434) BP: (116-125)/(60-79) 116/65 (09/11 0434) SpO2:  [92 %-97 %] 97 % (09/11 0434) Weight:  [100 kg] 100 kg (09/11 0442) Last BM Date: 06/20/18  Intake/Output from previous day: 09/10 0701 - 09/11 0700 In: 1979.7 [P.O.:1200; I.V.:349.7; IV Piggyback:400] Out: 725 [Urine:550; Drains:175] Intake/Output this shift: Total I/O In: -  Out: 270 [Urine:150; Drains:120]  PE: Abd: soft, NT, ND, RUQ with less output today than yesterday.  Still with some feculent appearing output and flecks present.  RLQ drain with no output currently.  Midline wound is clean with good beefy red granulation tissue.  Lab Results:  Recent Labs    06/19/18 0429 06/21/18 0422  WBC 8.3 7.8  HGB 9.4* 8.8*  HCT 30.3* 28.3*  PLT 276 297   BMET Recent Labs    06/19/18 0429 06/21/18 0422  NA 139 139  K 4.0 4.2  CL 95* 95*  CO2 34* 35*  GLUCOSE 126* 104*  BUN 22 18  CREATININE 0.99 0.86  CALCIUM 8.2* 8.4*   PT/INR No results for input(s): LABPROT, INR in the last 72 hours. CMP     Component Value Date/Time   NA 139 06/21/2018 0422   NA 139 01/17/2018 1401   K 4.2 06/21/2018 0422   CL 95 (L) 06/21/2018 0422   CO2 35 (H) 06/21/2018 0422   GLUCOSE 104 (H) 06/21/2018 0422   BUN 18 06/21/2018 0422   BUN 13 01/17/2018 1401   CREATININE 0.86 06/21/2018 0422   CALCIUM 8.4 (L) 06/21/2018 0422   PROT 5.2 (L) 06/14/2018 0451   PROT 6.4 01/17/2018 1401   ALBUMIN 1.9 (L) 06/14/2018 0451   ALBUMIN 4.2 01/17/2018 1401   AST 20 06/14/2018 0451   ALT 26 06/14/2018 0451   ALKPHOS 79 06/14/2018  0451   BILITOT 0.4 06/14/2018 0451   BILITOT 0.3 01/17/2018 1401   GFRNONAA >60 06/21/2018 0422   GFRAA >60 06/21/2018 0422   Lipase     Component Value Date/Time   LIPASE 103 (H) 06/02/2018 1801       Studies/Results: Ct Abdomen Pelvis W Contrast  Result Date: 06/20/2018 CLINICAL DATA:  Anastomotic leak after right hemicolectomy and status post placement right pelvic percutaneous drainage catheter into large abscess on 06/13/2018 and additional right upper quadrant percutaneous drain in to large cavity predominantly containing air on 06/17/2018. The right pelvic drainage catheter shows diminished output clinically. The right upper quadrant drainage catheter continues to have feculent output. EXAM: CT ABDOMEN AND PELVIS WITH CONTRAST TECHNIQUE: Multidetector CT imaging of the abdomen and pelvis was performed using the standard protocol following bolus administration of intravenous contrast. CONTRAST:  154mL OMNIPAQUE IOHEXOL 300 MG/ML  SOLN COMPARISON:  06/16/2018 FINDINGS: Lower chest: There is a small right pleural effusion and a trace amount of left pleural fluid. Bibasilar atelectasis present. Hepatobiliary: No focal liver abnormality is seen. No gallstones, gallbladder wall thickening, or biliary dilatation. Pancreas: Unremarkable. No pancreatic ductal dilatation or surrounding inflammatory changes. Spleen: Normal in size.  Stable calcified granulomata. Adrenals/Urinary Tract: Adrenal glands  are unremarkable. Kidneys are normal, without renal calculi, focal lesion, or hydronephrosis. Bladder is unremarkable. Stomach/Bowel: No evidence of bowel obstruction. Vascular/Lymphatic: No significant vascular findings are present. No enlarged abdominal or pelvic lymph nodes. Reproductive: Prostate is unremarkable. Other: At the level of the indwelling right upper quadrant percutaneous drain, the collection of predominantly air as well as fluid present previously has been decompressed. There remains a  tiny rim of fluid as well as small bubbles of extraluminal air that extend to abut the superior liver. At the level of the right pelvic drainage catheter, the residual component of abscess shows some decrease in size since the prior study on 09/06 with some fluid remaining around the pigtail portion of the drainage catheter. The area of fluid now measures roughly 4 x 6 cm compared to 5 x 8 cm previously. No new abscess identified. Musculoskeletal: No acute or significant osseous findings. IMPRESSION: 1. Decompression of right upper quadrant air and fluid collection after percutaneous catheter drainage. There remains a tiny rim of fluid as well as small extraluminal gas bubbles in this region suspicious for continued leak from bowel. 2. Further decrease in size of residual abscess fluid collection in the pelvis after percutaneous catheter drainage. There remains some fluid around the drainage catheter in the right pelvis. Electronically Signed   By: Aletta Edouard M.D.   On: 06/20/2018 17:26   Dg Abd Acute W/chest  Result Date: 06/19/2018 CLINICAL DATA:  Abdominal pain EXAM: DG ABDOMEN ACUTE W/ 1V CHEST COMPARISON:  06/18/2018 FINDINGS: Cardiac shadow is mildly enlarged but stable. Postoperative changes are seen. Aortic calcifications are again identified. Small bilateral pleural effusions are noted. Right upper quadrant drainage catheter is again noted and stable. Scattered large and small bowel gas is seen. Right lower quadrant drainage catheter is also noted and stable. Degenerative changes of lumbar spine are seen. No free air is noted. No other focal abnormality is seen. IMPRESSION: Drainage catheters within the abdomen consistent with the given clinical history. Bilateral small effusions. Electronically Signed   By: Inez Catalina M.D.   On: 06/19/2018 11:18    Anti-infectives: Anti-infectives (From admission, onward)   Start     Dose/Rate Route Frequency Ordered Stop   06/17/18 1200   Ampicillin-Sulbactam (UNASYN) 3 g in sodium chloride 0.9 % 100 mL IVPB     3 g 200 mL/hr over 30 Minutes Intravenous Every 6 hours 06/17/18 1148     06/10/18 1200  vancomycin (VANCOCIN) 1,500 mg in sodium chloride 0.9 % 500 mL IVPB  Status:  Discontinued     1,500 mg 250 mL/hr over 120 Minutes Intravenous Every 24 hours 06/09/18 1055 06/12/18 0819   06/09/18 1200  ceFEPIme (MAXIPIME) 1 g in sodium chloride 0.9 % 100 mL IVPB  Status:  Discontinued     1 g 200 mL/hr over 30 Minutes Intravenous Every 8 hours 06/09/18 1023 06/17/18 1148   06/09/18 1100  vancomycin (VANCOCIN) 2,000 mg in sodium chloride 0.9 % 500 mL IVPB     2,000 mg 250 mL/hr over 120 Minutes Intravenous  Once 06/09/18 1023 06/09/18 1349       Assessment/Plan History of alcohol use Hx of hypertension Hx of hyperlipidemia  Readmit 01/10/271 Acute diastolicCHFexacerbation, HCAP Atrial fibrillation with history of RVR-no anticoagulation secondary to prior bleeding Failure to thrive/deconditioning  Obstructing colon mass/adenocarcinoma 1/8positive nodes S/P exploratory laparotomy, right hemicolectomy with primary anastomosis, 05/19/2018 -Dr. Fanny Skates -Intra-abdominal abscess, secondary to anastomotic leak -s/p perc drain 9/3 in IRfor RLQ and 9/7  for RUQ -RUQ drain with feculent output.  Upon further review of his imaging, this appears to be secondary to an anastomotic leak.  Cont drain for now and follow output to determine when this closes down.  Output down to 150cc yesterday with increase in oral intake. -cont midline dressing changes BID -eating well. -hopefully can remove RLQ drain.  Will see what IR says today.   FEN: HH diet, Ensure ID: , vancomycin 8/30>>9/2;merrem 8/30 =>>9/7, Unasyn 9/7 -->9/11 Follow up: Dr. Luna Kitchens clinic DVT: Lovenox  Plan:cont RUQ drain for anastomotic leak.   Will need follow up with [name] and drain clinic at discharge.   LOS: 16 days    Henreitta Cea , [facility] Surgery 06/21/2018, 7:57 AM Pager: [phone]

## 2018-06-21 NOTE — Progress Notes (Addendum)
PROGRESS NOTE    Vincent Mcclure  DUK:[id number] DOB: 1932-07-24 DOA: 06/02/2018 PCP: [name], DO   Brief Narrative:  Vincent Mcclure is a 84 y.o. male with medical history significant for CAD, colon adenocarcinoma, history of alcohol abuse, A. fib, hypertension,status post ex lap and right colectomy with primary anastomosis on 8/9 due to adenocarcinoma obstructing colon. He presented this time for the evaluation of  abdominal pain.  He was found to have intra-abdominal abscess secondary to anastomotic leak.  He underwent percutaneous drains placement  by IR.  General surgery and IR following.  Plan is to discharge him to skilled nursing facility to follow-up with general surgery and IR.    Assessment & Plan:   Active Problems:   HLD (hyperlipidemia)   HTN (hypertension)   GERD   h/o Anemia due to blood loss, acute   Alcohol use/ h/o heavy abuse   s/p NSTEMI (non-ST elevated myocardial infarction); with subsequent urgent CABG  5/18 [facility])   Paroxysmal atrial fibrillation ([name])   Cancer of ascending colon [id number] s/p right colectomy 05/19/2018   Congestive heart failure (CHF) ([name])   Hypokalemia   Atrial fibrillation with RVR ([name])   Hypoalbuminemia   Subacute confusional state   CHF (congestive heart failure) ([name])   Peripheral edema   Post-operative state   Rupture of operation wound   General weakness   SOB (shortness of breath)  Peritoneal abscess Patient is s/p drain placement by IR on 9/3 on RLQ and 9/7 on RUQ. Culture of 9/3 aspiration significant for bacteroides fragilis. Culture from 9/7 aspiration with multiple organisms. CT on 06/20/18 showed decompression of right upper quadrant air and fluid collection after percutaneous catheter drainage. There remains a tiny rim of fluid as well as small extraluminal gas bubbles in this region suspicious for continued leak from bowel.Further decrease in size of residual abscess fluid collection in the pelvis after percutaneous  catheter drainage. General surgery and IR recommended to continue the drain and follow-up as an outpatient.  Acute respiratory failure with hypoxia and hypercarbia Secondary to CHF exacerbation and HCAP/COPD. S/p thoracentesis and diuresis. Treated with antibiotics. Productive cough. Already treated for HCAP. We will try to wean the oxygen down Sputum culture sent today and pending.  Severe chest/abdominal pain Resolved  Acute on chronic diastolic heart failure Diuresed. Patient's weight appears stable. Currently on lasix IV Continue Lasix  HCAP Treated with Completed course of cefepime. Received vancomycin earlier in admission.  Chronic atrial fibrillation Rate controlled. Not on anticoagulation secondary to recent GI bleeding. Continue aspirin and metoprolol  Anemia of chronic disease Chronic and at baseline  Alcohol abuse Cessation discussed earlier in admission.  Paroxysmal atrial fibrillation Rate with non-sustained tachycardia. Continue metoprolol    DVT prophylaxis: SCD Code Status: DNR Family Communication: Discussed with wife at the bedside Disposition Plan: Skilled nursing facility tomorrow.   Consultants: General surgery,IR  Procedures:Drain placement  Antimicrobials:None currently  Subjective: Patient seen and examined the bedside this morning.  Remains comfortable.  Denies any abdominal pain.  Continues to have productive cough.  Objective: Vitals:   06/21/18 0434 06/21/18 0442 06/21/18 0855 06/21/18 1350  BP: 116/65   119/68  Pulse: 97   87  Resp: 18   18  Temp: 98.7 F (37.1 C)   98.1 F (36.7 C)  TempSrc: Oral   Oral  SpO2: 97%  96% 97%  Weight:  100 kg    Height:        Intake/Output Summary (Last 24  hours) at 06/21/2018 1534 Last data filed at 06/21/2018 1430 Gross per 24 hour  Intake 1100.02 ml  Output 1220 ml  Net -119.98 ml   Filed Weights   06/19/18 0502 06/20/18 0438 06/21/18 0442  Weight: 100.2 kg 98.8 kg 100 kg      Examination:  General exam: Appears calm and comfortable ,Not in distress,average built HEENT:PERRL,Oral mucosa moist, Ear/Nose normal on gross exam Respiratory system: Bilateral equal air entry, normal vesicular breath sounds, no wheezes or crackles  Cardiovascular system: S1 & S2 heard, RRR. No JVD, murmurs, rubs, gallops or clicks. No pedal edema. Gastrointestinal system: Abdomen is nondistended, soft and nontender. No organomegaly or masses felt. Normal bowel sounds heard.Two drains Central nervous system: Alert and oriented. No focal neurological deficits. Extremities: No edema, no clubbing ,no cyanosis, distal peripheral pulses palpable. Skin: No rashes, lesions or ulcers,no icterus ,no pallor MSK: Normal muscle bulk,tone ,power Psychiatry: Judgement and insight appear normal. Mood & affect appropriate.     Data Reviewed: I have personally reviewed following labs and imaging studies  CBC: Recent Labs  Lab 06/16/18 0413 06/17/18 0506 06/18/18 0430 06/19/18 0429 06/21/18 0422  WBC 12.9* 17.9* 12.8* 8.3 7.8  NEUTROABS 11.3* 15.9* 11.3* 7.2 6.6  HGB 9.8* 9.5* 9.4* 9.4* 8.8*  HCT 32.0* 30.6* 29.9* 30.3* 28.3*  MCV 89.9 89.0 89.0 88.6 87.9  PLT 308 291 269 276 810   Basic Metabolic Panel: Recent Labs  Lab 06/16/18 0413 06/17/18 0506 06/18/18 0430 06/19/18 0429 06/21/18 0422  NA 141 137 140 139 139  K 4.2 4.5 3.8 4.0 4.2  CL 98 98 99 95* 95*  CO2 34* 31 32 34* 35*  GLUCOSE 119* 146* 115* 126* 104*  BUN 19 20 27* 22 18  CREATININE 1.22 1.35* 1.37* 0.99 0.86  CALCIUM 8.6* 8.4* 8.4* 8.2* 8.4*   GFR: Estimated Creatinine Clearance: 77.1 mL/min (by C-G formula based on SCr of 0.86 mg/dL). Liver Function Tests: No results for input(s): AST, ALT, ALKPHOS, BILITOT, PROT, ALBUMIN in the last 168 hours. No results for input(s): LIPASE, AMYLASE in the last 168 hours. No results for input(s): AMMONIA in the last 168 hours. Coagulation Profile: No results for input(s):  INR, PROTIME in the last 168 hours. Cardiac Enzymes: Recent Labs  Lab 06/19/18 1041 06/19/18 1611 06/19/18 2155  TROPONINI <0.03 <0.03 <0.03   BNP (last 3 results) No results for input(s): PROBNP in the last 8760 hours. HbA1C: No results for input(s): HGBA1C in the last 72 hours. CBG: Recent Labs  Lab 06/14/18 2114  GLUCAP 140*   Lipid Profile: No results for input(s): CHOL, HDL, LDLCALC, TRIG, CHOLHDL, LDLDIRECT in the last 72 hours. Thyroid Function Tests: No results for input(s): TSH, T4TOTAL, FREET4, T3FREE, THYROIDAB in the last 72 hours. Anemia Panel: No results for input(s): VITAMINB12, FOLATE, FERRITIN, TIBC, IRON, RETICCTPCT in the last 72 hours. Sepsis Labs: No results for input(s): PROCALCITON, LATICACIDVEN in the last 168 hours.  Recent Results (from the past 240 hour(s))  Aerobic/Anaerobic Culture (surgical/deep wound)     Status: None   Collection Time: 06/13/18  5:10 PM  Result Value Ref Range Status   Specimen Description   Final    ABDOMEN Performed at [facility] [address], [place], [place] [address]    Special Requests   Final    NONE Performed at [facility], [place] [address], [place], [place] [address]    Gram Stain   Final    ABUNDANT WBC PRESENT,BOTH  PMN AND MONONUCLEAR FEW GRAM NEGATIVE RODS    Culture   Final    MODERATE BACTEROIDES FRAGILIS BETA LACTAMASE POSITIVE NO GROWTH  AEROBICALLY Performed at [facility], [place] [address], [place], [place] [address]    Report Status 06/16/2018 FINAL  Final  Urine Culture     Status: None   Collection Time: 06/16/18 10:30 AM  Result Value Ref Range Status   Specimen Description   Final    URINE, CLEAN CATCH Performed at [facility], [place] [address], [place], [place] [address]    Special Requests   Final    NONE Performed at [facility], [place] [address], [place], [place] [address]    Culture   Final    NO  GROWTH Performed at [facility], [place] [address], [place], [place] [address]    Report Status 06/17/2018 FINAL  Final  Body fluid culture     Status: None   Collection Time: 06/17/18  8:55 AM  Result Value Ref Range Status   Specimen Description   Final    PLEURAL RIGHT Performed at [facility] [address], [place], [place] [address]    Special Requests   Final    NONE Performed at [facility], [place] [address], [place], [place] [address]    Gram Stain   Final    ABUNDANT WBC PRESENT,BOTH PMN AND MONONUCLEAR NO ORGANISMS SEEN    Culture   Final    NO GROWTH 3 DAYS Performed at [facility], [place] [address] [place], [place] [address]    Report Status 06/20/2018 FINAL  Final  Aerobic/Anaerobic Culture (surgical/deep wound)     Status: Abnormal (Preliminary result)   Collection Time: 06/17/18 10:24 AM  Result Value Ref Range Status   Specimen Description   Final    ABSCESS RUQ Performed at [facility] [address], [place], [place] [address]    Special Requests   Final    NONE Performed at [facility], [place] [address] [place], [place] [address]    Gram Stain   Final    ABUNDANT WBC PRESENT, PREDOMINANTLY PMN ABUNDANT GRAM NEGATIVE RODS MODERATE GRAM POSITIVE RODS FEW GRAM POSITIVE COCCI IN PAIRS RARE BUDDING YEAST SEEN Performed at [facility], [place] [address], [place], [place] [address]    Culture (A)  Final    MULTIPLE ORGANISMS PRESENT, NONE PREDOMINANT NO ANAEROBES ISOLATED; CULTURE IN PROGRESS FOR 5 DAYS    Report Status PENDING  Incomplete  Expectorated sputum assessment w rflx to resp cult     Status: None   Collection Time: 06/21/18  7:23 AM  Result Value Ref Range Status   Specimen Description SPUTUM  Final   Special Requests Normal  Final   Sputum evaluation   Final    Sputum specimen not acceptable for testing.  Please recollect.   INFORMED RN @0738  ON  06/21/18 BY [facility] Performed at [facility], [place] [address] [place], [place] [address]    Report Status 06/21/2018 FINAL  Final         Radiology Studies: Ct Abdomen Pelvis W Contrast  Result Date: 06/20/2018 CLINICAL DATA:  Anastomotic leak after right hemicolectomy and status post placement right pelvic percutaneous drainage catheter into large abscess on 06/13/2018 and additional right upper quadrant percutaneous drain in to large cavity predominantly containing air on 06/17/2018. The right pelvic drainage catheter shows diminished  output clinically. The right upper quadrant drainage catheter continues to have feculent output. EXAM: CT ABDOMEN AND PELVIS WITH CONTRAST TECHNIQUE: Multidetector CT imaging of the abdomen and pelvis was performed using the standard protocol following bolus administration of intravenous contrast. CONTRAST:  16mL OMNIPAQUE IOHEXOL 300 MG/ML  SOLN COMPARISON:  06/16/2018 FINDINGS: Lower chest: There is a small right pleural effusion and a trace amount of left pleural fluid. Bibasilar atelectasis present. Hepatobiliary: No focal liver abnormality is seen. No gallstones, gallbladder wall thickening, or biliary dilatation. Pancreas: Unremarkable. No pancreatic ductal dilatation or surrounding inflammatory changes. Spleen: Normal in size.  Stable calcified granulomata. Adrenals/Urinary Tract: Adrenal glands are unremarkable. Kidneys are normal, without renal calculi, focal lesion, or hydronephrosis. Bladder is unremarkable. Stomach/Bowel: No evidence of bowel obstruction. Vascular/Lymphatic: No significant vascular findings are present. No enlarged abdominal or pelvic lymph nodes. Reproductive: Prostate is unremarkable. Other: At the level of the indwelling right upper quadrant percutaneous drain, the collection of predominantly air as well as fluid present previously has been decompressed. There remains a tiny rim of fluid as well as small bubbles of  extraluminal air that extend to abut the superior liver. At the level of the right pelvic drainage catheter, the residual component of abscess shows some decrease in size since the prior study on 09/06 with some fluid remaining around the pigtail portion of the drainage catheter. The area of fluid now measures roughly 4 x 6 cm compared to 5 x 8 cm previously. No new abscess identified. Musculoskeletal: No acute or significant osseous findings. IMPRESSION: 1. Decompression of right upper quadrant air and fluid collection after percutaneous catheter drainage. There remains a tiny rim of fluid as well as small extraluminal gas bubbles in this region suspicious for continued leak from bowel. 2. Further decrease in size of residual abscess fluid collection in the pelvis after percutaneous catheter drainage. There remains some fluid around the drainage catheter in the right pelvis. Electronically Signed   By: Aletta Edouard M.D.   On: 06/20/2018 17:26        Scheduled Meds: . acetylcysteine  4 mL Nebulization TID  . aspirin EC  81 mg Oral Daily  . budesonide (PULMICORT) nebulizer solution  0.25 mg Nebulization BID  . dextromethorphan-guaiFENesin  2 tablet Oral BID  . enoxaparin (LOVENOX) injection  40 mg Subcutaneous Q24H  . feeding supplement (ENSURE ENLIVE)  237 mL Oral TID BM  . furosemide  20 mg Intravenous Daily  . gabapentin  100 mg Oral BID  . ipratropium-albuterol  3 mL Nebulization TID  . metoprolol tartrate  25 mg Oral BID  . multivitamins with iron  1 tablet Oral Daily  . pantoprazole  40 mg Oral Daily  . polyethylene glycol  17 g Oral Daily  . pravastatin  40 mg Oral QHS  . saccharomyces boulardii  250 mg Oral BID  . sodium chloride flush  5 mL Intracatheter Q8H  . sodium chloride flush  5 mL Intracatheter Q8H   Continuous Infusions: . sodium chloride 250 mL (06/20/18 1735)  . sodium chloride 20 mL/hr at 06/20/18 1832     LOS: 16 days    Time spent: 25 mins.More than 50% of  that time was spent in counseling and/or coordination of care.      Shelly Coss, MD [facility] Pager [phone]  If 7PM-7AM, please contact night-coverage [email or web address] Password [facility] 06/21/2018, 3:34 PM

## 2018-06-22 ENCOUNTER — Other Ambulatory Visit: Payer: Self-pay | Admitting: General Surgery

## 2018-06-22 ENCOUNTER — Telehealth: Payer: Self-pay | Admitting: Hematology

## 2018-06-22 ENCOUNTER — Inpatient Hospital Stay: Payer: Medicare HMO | Admitting: Hematology

## 2018-06-22 DIAGNOSIS — Z48815 Encounter for surgical aftercare following surgery on the digestive system: Secondary | ICD-10-CM | POA: Diagnosis not present

## 2018-06-22 DIAGNOSIS — R05 Cough: Secondary | ICD-10-CM | POA: Diagnosis not present

## 2018-06-22 DIAGNOSIS — G8929 Other chronic pain: Secondary | ICD-10-CM | POA: Diagnosis not present

## 2018-06-22 DIAGNOSIS — R509 Fever, unspecified: Secondary | ICD-10-CM | POA: Diagnosis not present

## 2018-06-22 DIAGNOSIS — I252 Old myocardial infarction: Secondary | ICD-10-CM | POA: Diagnosis not present

## 2018-06-22 DIAGNOSIS — I493 Ventricular premature depolarization: Secondary | ICD-10-CM | POA: Diagnosis not present

## 2018-06-22 DIAGNOSIS — E785 Hyperlipidemia, unspecified: Secondary | ICD-10-CM | POA: Diagnosis not present

## 2018-06-22 DIAGNOSIS — Z9049 Acquired absence of other specified parts of digestive tract: Secondary | ICD-10-CM | POA: Diagnosis not present

## 2018-06-22 DIAGNOSIS — G609 Hereditary and idiopathic neuropathy, unspecified: Secondary | ICD-10-CM | POA: Diagnosis not present

## 2018-06-22 DIAGNOSIS — E876 Hypokalemia: Secondary | ICD-10-CM | POA: Diagnosis not present

## 2018-06-22 DIAGNOSIS — K651 Peritoneal abscess: Secondary | ICD-10-CM | POA: Diagnosis not present

## 2018-06-22 DIAGNOSIS — I110 Hypertensive heart disease with heart failure: Secondary | ICD-10-CM | POA: Diagnosis not present

## 2018-06-22 DIAGNOSIS — C189 Malignant neoplasm of colon, unspecified: Secondary | ICD-10-CM | POA: Diagnosis not present

## 2018-06-22 DIAGNOSIS — J9690 Respiratory failure, unspecified, unspecified whether with hypoxia or hypercapnia: Secondary | ICD-10-CM | POA: Diagnosis not present

## 2018-06-22 DIAGNOSIS — T8143XA Infection following a procedure, organ and space surgical site, initial encounter: Secondary | ICD-10-CM

## 2018-06-22 DIAGNOSIS — I509 Heart failure, unspecified: Secondary | ICD-10-CM | POA: Diagnosis not present

## 2018-06-22 DIAGNOSIS — I480 Paroxysmal atrial fibrillation: Secondary | ICD-10-CM | POA: Diagnosis not present

## 2018-06-22 DIAGNOSIS — I2510 Atherosclerotic heart disease of native coronary artery without angina pectoris: Secondary | ICD-10-CM | POA: Diagnosis not present

## 2018-06-22 DIAGNOSIS — D638 Anemia in other chronic diseases classified elsewhere: Secondary | ICD-10-CM | POA: Diagnosis not present

## 2018-06-22 DIAGNOSIS — D3502 Benign neoplasm of left adrenal gland: Secondary | ICD-10-CM | POA: Diagnosis not present

## 2018-06-22 DIAGNOSIS — I4891 Unspecified atrial fibrillation: Secondary | ICD-10-CM | POA: Diagnosis not present

## 2018-06-22 DIAGNOSIS — R0602 Shortness of breath: Secondary | ICD-10-CM | POA: Diagnosis not present

## 2018-06-22 DIAGNOSIS — Z4803 Encounter for change or removal of drains: Secondary | ICD-10-CM | POA: Diagnosis not present

## 2018-06-22 DIAGNOSIS — Z978 Presence of other specified devices: Secondary | ICD-10-CM | POA: Diagnosis not present

## 2018-06-22 DIAGNOSIS — Z951 Presence of aortocoronary bypass graft: Secondary | ICD-10-CM | POA: Diagnosis not present

## 2018-06-22 DIAGNOSIS — T8189XA Other complications of procedures, not elsewhere classified, initial encounter: Secondary | ICD-10-CM | POA: Diagnosis not present

## 2018-06-22 DIAGNOSIS — I5033 Acute on chronic diastolic (congestive) heart failure: Secondary | ICD-10-CM | POA: Diagnosis not present

## 2018-06-22 DIAGNOSIS — T85898A Other specified complication of other internal prosthetic devices, implants and grafts, initial encounter: Secondary | ICD-10-CM | POA: Diagnosis not present

## 2018-06-22 DIAGNOSIS — F1011 Alcohol abuse, in remission: Secondary | ICD-10-CM | POA: Diagnosis not present

## 2018-06-22 DIAGNOSIS — J449 Chronic obstructive pulmonary disease, unspecified: Secondary | ICD-10-CM | POA: Diagnosis not present

## 2018-06-22 DIAGNOSIS — R7302 Impaired glucose tolerance (oral): Secondary | ICD-10-CM | POA: Diagnosis not present

## 2018-06-22 DIAGNOSIS — R0689 Other abnormalities of breathing: Secondary | ICD-10-CM | POA: Diagnosis not present

## 2018-06-22 DIAGNOSIS — M2550 Pain in unspecified joint: Secondary | ICD-10-CM | POA: Diagnosis not present

## 2018-06-22 DIAGNOSIS — I959 Hypotension, unspecified: Secondary | ICD-10-CM | POA: Diagnosis not present

## 2018-06-22 DIAGNOSIS — M545 Low back pain: Secondary | ICD-10-CM | POA: Diagnosis not present

## 2018-06-22 DIAGNOSIS — I10 Essential (primary) hypertension: Secondary | ICD-10-CM | POA: Diagnosis not present

## 2018-06-22 DIAGNOSIS — G8918 Other acute postprocedural pain: Secondary | ICD-10-CM | POA: Diagnosis not present

## 2018-06-22 DIAGNOSIS — R0902 Hypoxemia: Secondary | ICD-10-CM | POA: Diagnosis not present

## 2018-06-22 DIAGNOSIS — J189 Pneumonia, unspecified organism: Secondary | ICD-10-CM | POA: Diagnosis not present

## 2018-06-22 DIAGNOSIS — Z7401 Bed confinement status: Secondary | ICD-10-CM | POA: Diagnosis not present

## 2018-06-22 DIAGNOSIS — R609 Edema, unspecified: Secondary | ICD-10-CM | POA: Diagnosis not present

## 2018-06-22 DIAGNOSIS — J948 Other specified pleural conditions: Secondary | ICD-10-CM | POA: Diagnosis not present

## 2018-06-22 DIAGNOSIS — I739 Peripheral vascular disease, unspecified: Secondary | ICD-10-CM | POA: Diagnosis not present

## 2018-06-22 DIAGNOSIS — Z980 Intestinal bypass and anastomosis status: Secondary | ICD-10-CM | POA: Diagnosis not present

## 2018-06-22 DIAGNOSIS — I700 Atherosclerosis of aorta: Secondary | ICD-10-CM | POA: Diagnosis not present

## 2018-06-22 DIAGNOSIS — T8189XD Other complications of procedures, not elsewhere classified, subsequent encounter: Secondary | ICD-10-CM | POA: Diagnosis not present

## 2018-06-22 DIAGNOSIS — L03311 Cellulitis of abdominal wall: Secondary | ICD-10-CM | POA: Diagnosis not present

## 2018-06-22 DIAGNOSIS — T8149XA Infection following a procedure, other surgical site, initial encounter: Secondary | ICD-10-CM | POA: Diagnosis not present

## 2018-06-22 DIAGNOSIS — K210 Gastro-esophageal reflux disease with esophagitis: Secondary | ICD-10-CM | POA: Diagnosis not present

## 2018-06-22 DIAGNOSIS — R1900 Intra-abdominal and pelvic swelling, mass and lump, unspecified site: Secondary | ICD-10-CM | POA: Diagnosis not present

## 2018-06-22 DIAGNOSIS — D509 Iron deficiency anemia, unspecified: Secondary | ICD-10-CM | POA: Diagnosis not present

## 2018-06-22 DIAGNOSIS — J90 Pleural effusion, not elsewhere classified: Secondary | ICD-10-CM | POA: Diagnosis not present

## 2018-06-22 DIAGNOSIS — C182 Malignant neoplasm of ascending colon: Secondary | ICD-10-CM | POA: Diagnosis not present

## 2018-06-22 LAB — AEROBIC/ANAEROBIC CULTURE (SURGICAL/DEEP WOUND)

## 2018-06-22 LAB — AEROBIC/ANAEROBIC CULTURE W GRAM STAIN (SURGICAL/DEEP WOUND)

## 2018-06-22 MED ORDER — DM-GUAIFENESIN ER 30-600 MG PO TB12: 2.0000 | ORAL_TABLET | Freq: Two times a day (BID) | ORAL | 0 refills | Status: DC

## 2018-06-22 MED ORDER — TRAMADOL HCL 50 MG PO TABS: 50.0000 mg | ORAL_TABLET | Freq: Two times a day (BID) | ORAL | 0 refills | Status: DC | PRN

## 2018-06-22 MED ORDER — ENSURE SURGERY PO LIQD
237.0000 mL | Freq: Two times a day (BID) | ORAL | Status: DC
  Administered 2018-06-22: 237 mL via ORAL
  Filled 2018-06-22: qty 237

## 2018-06-22 MED ORDER — PANTOPRAZOLE SODIUM 40 MG PO TBEC: 40.0000 mg | DELAYED_RELEASE_TABLET | Freq: Every day | ORAL | 0 refills | Status: DC

## 2018-06-22 NOTE — Progress Notes (Signed)
Referring Physician(s): Dr. Annie Main Chiu/Dr. Alphonsa Overall  Supervising Physician: Arne Cleveland  Patient Status:  [facility] - In-pt  Chief Complaint: Follow-up RLQ drain placed 9/3 by Dr. Earleen Newport and RUQ drain placed 9/7 by Dr. Vernard Gambles  Subjective:  Vincent Mcclure Brownis a 84 y.o.malewith a past medical history of ETOH abuse, anemia, GERD, HLD, HTN, a.fib and most recently colon cancer with right colectomy on 05/19/18. Patient was d/ced from hospital on 8/20 following colectomy, he presented to his outpatient follow up appointment with surgery on 8/23 and was found to be very weak and congested - he was sent to [facility] ED for evaluation. CXR in ED showed pneumoperitoneum which was thought to be related to recent surgery. He was admitted from the ED for treatment of acute respiratory failure due to CHF exacerbation. He subsequently developed HCAP with sepsis and IV antibiotics were started on 8/30.   CT abdomen and pelvis performed on 9/2 showed evidence of bowel perforation/anastomic breakdown with large volume intraperitoneal free air fluid in the right upper quadrant as well as a large loculated fluid collection in the right anterior pelvis extending along the left pericolic gutter consistent with peritoneal abscess.IR was consulted for aspiration and drainage of peritoneal abscess and RLQ drain was placed on 9/3 by Dr. Earleen Newport.  Patient continued to complain of RLQ and epigastric pain in the following days and he continued to have worsening leukocytosis - repeat CT abdomen and pelvis was obtained on 9/6 which showed partial resolution of RLQ collection however there was enlargement of RUQ gas and fluid collection. IR was consulted again for RUQ drain placement which was performed on 9/7 by Dr. Vernard Gambles. Right sided diagnostic thoracentesis was also performed in IR on 9/7 due to increasing right side pleural effusion.  Output from RLQ decreased greatly and repeat CT abdomen/pelvis was performed on 9/10  which showed decompression of RUQ air and fluid collection with tiny rim of fluid remaining as well as small extraluminal gas bubbles suspicious for continued leak from bowel. Additionally, there was further decrease in the size of residual abscess fluid collection in the pelvis however some fluid remains around the drainage cathter.  Patient reports he feels ok today, has some intermittent abdominal pain at times - usually after eating. He is looking forward to being discharged from the hospital so that he can "get back to normal." He reports good appetite, denies any nausea, vomiting, fever or chills.    Allergies: No known allergies  Medications: Prior to Admission medications   Medication Sig Start Date End Date Taking? Authorizing Provider  acetaminophen (TYLENOL) 500 MG tablet Take 1 tablet (500 mg total) by mouth every 6 (six) hours as needed for mild pain. 05/30/18  Yes Rayburn, Floyce Stakes, PA-C  aspirin EC 81 MG EC tablet Take 1 tablet (81 mg total) by mouth daily. 02/25/17  Yes Conte, Tessa N, PA-C  furosemide (LASIX) 40 MG tablet Take 1 tablet (40 mg total) by mouth daily. 08/10/17 08/10/18 Yes Dorothy Spark, MD  gabapentin (NEURONTIN) 100 MG capsule Take 1 capsule (100 mg total) by mouth 2 (two) times daily. 05/30/18  Yes Rayburn, Claiborne Billings A, PA-C  metoprolol succinate (TOPROL XL) 50 MG 24 hr tablet Take 1 tablet (50 mg total) by mouth daily. Take with or immediately following a meal. 05/24/17  Yes Imogene Burn, PA-C  polyethylene glycol [facility]) packet Take 17 g by mouth daily. 05/30/18  Yes Rayburn, Claiborne Billings A, PA-C  pravastatin (PRAVACHOL) 40 MG tablet I  po q hs Patient taking differently: Take 40 mg by mouth daily.  01/17/18  Yes Opalski, Deborah, DO  alum & mag hydroxide-simeth (MAALOX/MYLANTA) 200-200-20 MG/5ML suspension Take 30 mLs by mouth every 6 (six) hours as needed for indigestion (gas/bloating). Patient not taking: Reported on 06/02/2018 05/30/18   Rayburn, Claiborne Billings A, PA-C    ferrous sulfate 325 (65 FE) MG EC tablet Take 1 tablet (325 mg total) by mouth 3 (three) times daily with meals. Patient not taking: Reported on 06/02/2018 01/17/18   Vincent Dance, DO  traMADol (ULTRAM) 50 MG tablet Take 1 tablet (50 mg total) by mouth every 6 (six) hours as needed for moderate pain or severe pain. Patient not taking: Reported on 06/02/2018 05/30/18   Rayburn, Floyce Stakes, PA-C     Vital Signs: BP 127/70 (BP Location: Left Arm)   Pulse 95   Temp 99.1 F (37.3 C) (Oral)   Resp (!) 22   Ht 5\' 11"  (1.803 m)   Wt 217 lb 13 oz (98.8 kg)   SpO2 97%   BMI 30.38 kg/m   Physical Exam  Imaging: Ct Abdomen Pelvis W Contrast  Result Date: 06/20/2018 CLINICAL DATA:  Anastomotic leak after right hemicolectomy and status post placement right pelvic percutaneous drainage catheter into large abscess on 06/13/2018 and additional right upper quadrant percutaneous drain in to large cavity predominantly containing air on 06/17/2018. The right pelvic drainage catheter shows diminished output clinically. The right upper quadrant drainage catheter continues to have feculent output. EXAM: CT ABDOMEN AND PELVIS WITH CONTRAST TECHNIQUE: Multidetector CT imaging of the abdomen and pelvis was performed using the standard protocol following bolus administration of intravenous contrast. CONTRAST:  140mL OMNIPAQUE IOHEXOL 300 MG/ML  SOLN COMPARISON:  06/16/2018 FINDINGS: Lower chest: There is a small right pleural effusion and a trace amount of left pleural fluid. Bibasilar atelectasis present. Hepatobiliary: No focal liver abnormality is seen. No gallstones, gallbladder wall thickening, or biliary dilatation. Pancreas: Unremarkable. No pancreatic ductal dilatation or surrounding inflammatory changes. Spleen: Normal in size.  Stable calcified granulomata. Adrenals/Urinary Tract: Adrenal glands are unremarkable. Kidneys are normal, without renal calculi, focal lesion, or hydronephrosis. Bladder is unremarkable.  Stomach/Bowel: No evidence of bowel obstruction. Vascular/Lymphatic: No significant vascular findings are present. No enlarged abdominal or pelvic lymph nodes. Reproductive: Prostate is unremarkable. Other: At the level of the indwelling right upper quadrant percutaneous drain, the collection of predominantly air as well as fluid present previously has been decompressed. There remains a tiny rim of fluid as well as small bubbles of extraluminal air that extend to abut the superior liver. At the level of the right pelvic drainage catheter, the residual component of abscess shows some decrease in size since the prior study on 09/06 with some fluid remaining around the pigtail portion of the drainage catheter. The area of fluid now measures roughly 4 x 6 cm compared to 5 x 8 cm previously. No new abscess identified. Musculoskeletal: No acute or significant osseous findings. IMPRESSION: 1. Decompression of right upper quadrant air and fluid collection after percutaneous catheter drainage. There remains a tiny rim of fluid as well as small extraluminal gas bubbles in this region suspicious for continued leak from bowel. 2. Further decrease in size of residual abscess fluid collection in the pelvis after percutaneous catheter drainage. There remains some fluid around the drainage catheter in the right pelvis. Electronically Signed   By: Aletta Edouard M.D.   On: 06/20/2018 17:26   Dg Abd Acute W/chest  Result Date:  06/19/2018 CLINICAL DATA:  Abdominal pain EXAM: DG ABDOMEN ACUTE W/ 1V CHEST COMPARISON:  06/18/2018 FINDINGS: Cardiac shadow is mildly enlarged but stable. Postoperative changes are seen. Aortic calcifications are again identified. Small bilateral pleural effusions are noted. Right upper quadrant drainage catheter is again noted and stable. Scattered large and small bowel gas is seen. Right lower quadrant drainage catheter is also noted and stable. Degenerative changes of lumbar spine are seen. No free air  is noted. No other focal abnormality is seen. IMPRESSION: Drainage catheters within the abdomen consistent with the given clinical history. Bilateral small effusions. Electronically Signed   By: Inez Catalina M.D.   On: 06/19/2018 11:18    Labs:  CBC: Recent Labs    06/17/18 0506 06/18/18 0430 06/19/18 0429 06/21/18 0422  WBC 17.9* 12.8* 8.3 7.8  HGB 9.5* 9.4* 9.4* 8.8*  HCT 30.6* 29.9* 30.3* 28.3*  PLT 291 269 276 297    COAGS: Recent Labs    06/02/18 1801 06/13/18 0957  INR 1.15 1.20  APTT  --  37*    BMP: Recent Labs    06/17/18 0506 06/18/18 0430 06/19/18 0429 06/21/18 0422  NA 137 140 139 139  K 4.5 3.8 4.0 4.2  CL 98 99 95* 95*  CO2 31 32 34* 35*  GLUCOSE 146* 115* 126* 104*  BUN 20 27* 22 18  CALCIUM 8.4* 8.4* 8.2* 8.4*  CREATININE 1.35* 1.37* 0.99 0.86  GFRNONAA 47* 46* >60 >60  GFRAA 54* 53* >60 >60    LIVER FUNCTION TESTS: Recent Labs    05/18/18 1851 06/02/18 1700 06/13/18 0438 06/14/18 0451  BILITOT 0.9 0.5 0.7 0.4  AST 28 47* 29 20  ALT 11 34 33 26  ALKPHOS 90 121 90 79  PROT 7.2 5.7* 5.3* 5.2*  ALBUMIN 4.2 2.3* 2.2* 1.9*    Assessment and Plan:  Follow up RLQ and RUQ drains placed in IR - patient with some ongoing abdominal pain at midline incision which is improving over time. Minimal pain on palpation during exam today. RUQ drain with ongoing feculent output and gas in gravity bag, 95 cc output recorded in last 24 hours. Per surgery likely due to anastomotic leak - plan to continue drain for now and follow up with their service as an outpatient. RLQ drain with increased output per I/O - 175 cc in last 24 hours. CT scan was repeated on 9/10 which showed some residual fluid collection still.   WBC trending down on most recent lab work (7.8),patient afebrile, vital signs stable, creatinine improved to 0.86, hemoglobin slightly decreased to 8.8.   Plan per admitting service to d/c to SNF once stable. IR to continue to follow RUQ and RLQ  drains; continue BID flushes of both drains with 3-5 cc NS and record output for both drains. Plan for repeat CT abdomen/pelvic in 1 week for drain assessment. IR will follow in outpatient drain clinic if d/ced to rehab before drains are ready to be removed. D/c per admitting service.  Electronically Signed: Joaquim Nam, PA-C 06/22/2018, 9:57 AM   I spent a total of 15 Minutes at the the patient's bedside AND on the patient's hospital floor or unit, greater than 50% of which was counseling/coordinating care for RUQ and RLQ abscess drains.

## 2018-06-22 NOTE — Telephone Encounter (Signed)
Pts appt canceled per 9/12 sch message

## 2018-06-22 NOTE — Discharge Summary (Addendum)
Physician Discharge Summary  Vincent Mcclure HWE:[id number] DOB: 1932/07/08 DOA: 06/02/2018  PCP: [name], DO  Admit date: 06/02/2018 Discharge date: 06/22/2018  Admitted From: Home Disposition:  Home  Discharge Condition:Stable CODE STATUS: DNR Diet recommendation: Heart Healthy  Brief/Interim Summary:  Vincent Krogh Brownis a 84 y.o.male with medical history significant for CAD, colon adenocarcinoma, history of alcohol abuse, A. fib, hypertension,status post ex lap and right colectomy with primary anastomosis on 8/9 due to adenocarcinoma obstructing colon. He presented this time for the evaluation of  abdominal pain.  He was found to have intra-abdominal abscess secondary to anastomotic leak.  He underwent percutaneous drains placement  by IR.  General surgery and IR were following.  Plan is to discharge him to skilled nursing facility to follow-up with general surgery and IR as an outpatient.  Following problems were addressed during his hospitalization:   Peritoneal abscess Patient is s/p drain placement by IR on 9/3 on RLQ and 9/7 on RUQ. Culture of 9/3 aspiration significant for bacteroides fragilis. Culture from 9/7 aspirationwith multiple organisms. CT on 06/20/18 reported as: decompression of right upper quadrant air and fluid collection after percutaneous catheter drainage. There remains a tiny rim of fluid as well as small extraluminal gas bubbles in this region suspicious for continued leak from bowel.Further decrease in size of residual abscess fluid collection in the pelvis after percutaneous catheter drainage. General surgery and IR recommended to continue the drain and follow-up as an outpatient. Recommended to continue BID flushes of both drains with 3-5 cc NSand record output for both drains.  Acute respiratory failure with hypoxia and hypercarbia Secondary to CHF exacerbation and HCAP/COPD. S/p thoracentesis and diuresis. Treated with antibiotics. Productive cough.  Already treated for HCAP. Continue supplemental oxygen.  He is already on oxygen from home.  Severe chest/abdominal pain Resolved  Acute on chronic diastolicheart failure Diuresed. Patient's weight appears stable. Continue Lasix  HCAP Treated with Completed course of cefepime. Received vancomycin earlier in admission.  Chronic atrial fibrillation Rate controlled. Not on anticoagulation secondary to recent GI bleeding. Continue aspirin and metoprolol  Anemia of chronic disease Chronic and at baseline  Alcohol abuse Cessation discussed earlier in admission.  Paroxysmal atrial fibrillation Rate with non-sustained tachycardia. Continue metoprolol   Discharge Diagnoses:  Active Problems:   HLD (hyperlipidemia)   HTN (hypertension)   GERD   h/o Anemia due to blood loss, acute   Alcohol use/ h/o heavy abuse   s/p NSTEMI (non-ST elevated myocardial infarction); with subsequent urgent CABG  5/18 [facility])   Paroxysmal atrial fibrillation ([name])   Cancer of ascending colon [id number] s/p right colectomy 05/19/2018   Congestive heart failure (CHF) ([name])   Hypokalemia   Atrial fibrillation with RVR ([name])   Hypoalbuminemia   Subacute confusional state   CHF (congestive heart failure) ([name])   Peripheral edema   Post-operative state   Rupture of operation wound   General weakness   SOB (shortness of breath)    Discharge Instructions  Discharge Instructions    AMB Referral to [place] [facility]   Complete by:  As directed    Please assign to [facility] for follow up while at [facility]. Recently assigned to [facility] NP prior to hospitalization. Written consents obtained. Please call with questions. Marthenia Rolling, [place] Liaison-[phone]   Reason for consult:  Please assign to [facility] LCSW   Diagnoses of:  Heart Failure   Expected date of contact:  1-3 days (reserved  for hospital discharges)   Diet - low sodium  heart healthy   Complete by:  As directed    Discharge instructions   Complete by:  As directed    1) Do a CBC and BMP tests in a week. 2)Take prescribed medications as instructed. 3)Follow up with general surgery and IR as an outpatient for the removal of the drain. 4)Continue BID flushes of both drains with 3-5 cc NS   Increase activity slowly   Complete by:  As directed      Allergies as of 06/22/2018      Reactions   No Known Allergies       Medication List    STOP taking these medications   alum & mag hydroxide-simeth 200-200-20 MG/5ML suspension Commonly known as:  MAALOX/MYLANTA     TAKE these medications   acetaminophen 500 MG tablet Commonly known as:  TYLENOL Take 1 tablet (500 mg total) by mouth every 6 (six) hours as needed for mild pain.   aspirin 81 MG EC tablet Take 1 tablet (81 mg total) by mouth daily.   dextromethorphan-guaiFENesin 30-600 MG 12hr tablet Commonly known as:  MUCINEX DM Take 2 tablets by mouth 2 (two) times daily.   ferrous sulfate 325 (65 FE) MG EC tablet Take 1 tablet (325 mg total) by mouth 3 (three) times daily with meals.   furosemide 40 MG tablet Commonly known as:  LASIX Take 1 tablet (40 mg total) by mouth daily.   gabapentin 100 MG capsule Commonly known as:  NEURONTIN Take 1 capsule (100 mg total) by mouth 2 (two) times daily.   metoprolol succinate 50 MG 24 hr tablet Commonly known as:  TOPROL-XL Take 1 tablet (50 mg total) by mouth daily. Take with or immediately following a meal.   pantoprazole 40 MG tablet Commonly known as:  PROTONIX Take 1 tablet (40 mg total) by mouth daily. Start taking on:  06/23/2018   polyethylene glycol packet Commonly known as:  MIRALAX / GLYCOLAX Take 17 g by mouth daily.   pravastatin 40 MG tablet Commonly known as:  PRAVACHOL I po q hs What changed:    how much to take  how to take this  when to take this  additional instructions   traMADol 50 MG tablet Commonly known as:   ULTRAM Take 1 tablet (50 mg total) by mouth every 12 (twelve) hours as needed for moderate pain. What changed:    when to take this  reasons to take this            Durable Medical Equipment  (From admission, onward)         Start     Ordered   06/06/18 0700  For home use only DME 3 n 1  Once     06/06/18 9485          Contact information for follow-up providers    Care, [facility] Follow up.   Specialty:  [place] Why:  Home Health Physical Therapy and RN- agency will call to resume [place] information: [place] [address] [phone]        Fanny Skates, MD Follow up on 07/10/2018.   Specialty:  General Surgery Why:  10:45am, arrive by 10:15am for paperwork and check in.  If you are coming from a skilled nursing facility, someone MUST stay with you throughout your entire visit or your appointment may be cancelled. Contact information: [address] [place] [address] [phone]        Arne Cleveland, MD Follow up.   Specialties:  Interventional Radiology, Radiology Why:  They will call you with appointment date and time Contact information: [address] [place] [address] [phone]            Contact information for after-discharge care    Destination    [facility] Preferred SNF .   Service:  Skilled Nursing Contact information: [place] [phone]                 Allergies  Allergen Reactions  . No Known Allergies     Consultations:  General surgery, IR   Procedures/Studies: Dg Chest 1 View  Result Date: 06/18/2018 CLINICAL DATA:  Hypoxia EXAM: CHEST  1 VIEW COMPARISON:  June 17, 2017 FINDINGS: No pneumothorax. Stable mild cardiomegaly. Small effusions with underlying atelectasis. No overt edema or other abnormality. IMPRESSION: Mild cardiomegaly with small  pleural effusions and underlying atelectasis. Electronically Signed   By: Dorise Bullion III M.D   On: 06/18/2018 07:17   Dg Chest 1 View  Result Date: 06/17/2018 CLINICAL DATA:  84 year old male status post right-sided thoracentesis EXAM: CHEST  1 VIEW COMPARISON:  06/16/2018 FINDINGS: Cardiomediastinal silhouette unchanged in size and contour. Surgical changes of prior median sternotomy and CABG. Improved opacity at the right lung base, status post thoracentesis. No evidence of pneumothorax. Lucency associated with the right hemidiaphragm is favored to represent increasing subdiaphragmatic gas component within the abdomen, present on the most recent CT scan. Blunting of the left costophrenic angle. Patchy airspace opacities at the lung bases. IMPRESSION: Improved appearance of right-sided pleural fluid status post thoracentesis, with no complicating features. Trace left-sided pleural effusion. Electronically Signed   By: Corrie Mckusick D.O.   On: 06/17/2018 09:24   Dg Chest 2 View  Result Date: 06/02/2018 CLINICAL DATA:  84 y/o M; laparoscopic partial colectomy 05/19/2018. Shortness of breath and congestion. EXAM: CHEST - 2 VIEW COMPARISON:  05/22/2018 chest radiograph FINDINGS: Stable normal cardiac silhouette given projection and technique. Post median sternotomy with wires in alignment. Aortic atherosclerosis with calcification. Clear lungs. No pleural effusion or pneumothorax. Pneumoperitoneum. No acute osseous abnormality is evident. IMPRESSION: 1. Pneumoperitoneum. 2. No acute pulmonary process identified. These results were called by telephone at the time of interpretation on 06/02/2018 at 5:46 pm to Dr. Vallery Ridge, who verbally acknowledged these results. Electronically Signed   By: Kristine Garbe M.D.   On: 06/02/2018 17:48   Ct Chest W Contrast  Result Date: 06/12/2018 CLINICAL DATA:  RIGHT colectomy 05/19/2018 for colon adenocarcinoma. Suspicion pneumoperitoneum 1 chest radiograph EXAM:  CT CHEST, ABDOMEN, AND PELVIS WITH CONTRAST TECHNIQUE: Multidetector CT imaging of the chest, abdomen and pelvis was performed following the standard protocol during bolus administration of intravenous contrast. CONTRAST:  15mL OMNIPAQUE IOHEXOL 300 MG/ML  SOLN COMPARISON:  Radiograph 06/09/2018, CT 05/18/2018 FINDINGS: CT CHEST FINDINGS Cardiovascular: Post CABG anatomy. No large pulmonary emboli identified. No pericardial effusion. Mediastinum/Nodes: No axillary supraclavicular adenopathy. No mediastinal hilar adenopathy. Esophagus normal. Lungs/Pleura: Moderate sized RIGHT pleural effusion with basilar atelectasis. Mild LEFT basilar atelectasis. No pneumonia. No pneumothorax. No pulmonary edema. Musculoskeletal: Degenerate spurring of the spine. Midline sternotomy. CT ABDOMEN AND PELVIS FINDINGS Hepatobiliary: Extensive extraperitoneal free air position anterior to the liver and lateral to the liver. No focal hepatic lesion. Gallbladder collapsed. Portal veins patent. Pancreas: Pancreas is normal. No ductal dilatation. No  pancreatic inflammation. Spleen: Normal spleen Adrenals/urinary tract: Stable nodule of the LEFT adrenal gland measures 2.2 cm. Previously characterized as adenoma. Stomach/Bowel: Stomach, duodenum, small-bowel appear normal. RIGHT hemicolectomy anatomy. No obstruction at the anastomosis. Oral contrast passes through the small bowel into the transverse colon through the anastomosis without gross evidence of leak. There is however large volume intraperitoneal free air in the RIGHT upper quadrant. This suggest perforated bowel or anastomotic breakdown. The transverse and descending colon are normal. Rectum appears normal. Within the central lower RIGHT abdomen large fluid collection measuring 14.5 by 11.6 cm. This collection is homogeneous low simple fluid attenuation cyst in centrally with a thin enhancing rim consistent with peritoneal abscess. (Image 101/2). The collection has a thin arm which  extends to the LEFT pericolic gutter. No significant free fluid in the pelvis. Small amount free fluid anterior to the RIGHT hepatic lobe also with a peritoneal enhancement (image 72/2). Vascular/Lymphatic: Abdominal aorta is normal caliber with atherosclerotic calcification. There is no retroperitoneal or periportal lymphadenopathy. No pelvic lymphadenopathy. Reproductive: Prostate normal Other: Large intraperitoneal free air in the RIGHT upper quadrant described in the stomach bowel section. Large peritoneal abscess as described in the bowel section. Musculoskeletal: No aggressive osseous lesion. IMPRESSION: 1. Evidence all bowel perforation/anastomosed breakdown with large volume intraperitoneal free air fluid in the RIGHT upper quadrant adjacent and above the liver. 2. Large loculated fluid collection in the RIGHT anterior pelvis extending along the LEFT pericolic gutter consists with PERITONEAL ABSCESS. 3. Of note, no large volume intraperitoneal free fluid. Oral contrast passed through the RIGHT colon anastomosis without evidence of leak. Findings could indicate a prior anastomotic breakdown which has healed in the interval with subsequent abscess formation from the prior leak / breakdown. Critical Value/emergent results were called by telephone at the time of interpretation on 06/12/2018 at 3:12 pm to Dr. Marylu Lund , who verbally acknowledged these results. Electronically Signed   By: Suzy Bouchard M.D.   On: 06/12/2018 15:12   Ct Abdomen Pelvis W Contrast  Result Date: 06/20/2018 CLINICAL DATA:  Anastomotic leak after right hemicolectomy and status post placement right pelvic percutaneous drainage catheter into large abscess on 06/13/2018 and additional right upper quadrant percutaneous drain in to large cavity predominantly containing air on 06/17/2018. The right pelvic drainage catheter shows diminished output clinically. The right upper quadrant drainage catheter continues to have feculent output.  EXAM: CT ABDOMEN AND PELVIS WITH CONTRAST TECHNIQUE: Multidetector CT imaging of the abdomen and pelvis was performed using the standard protocol following bolus administration of intravenous contrast. CONTRAST:  11mL OMNIPAQUE IOHEXOL 300 MG/ML  SOLN COMPARISON:  06/16/2018 FINDINGS: Lower chest: There is a small right pleural effusion and a trace amount of left pleural fluid. Bibasilar atelectasis present. Hepatobiliary: No focal liver abnormality is seen. No gallstones, gallbladder wall thickening, or biliary dilatation. Pancreas: Unremarkable. No pancreatic ductal dilatation or surrounding inflammatory changes. Spleen: Normal in size.  Stable calcified granulomata. Adrenals/Urinary Tract: Adrenal glands are unremarkable. Kidneys are normal, without renal calculi, focal lesion, or hydronephrosis. Bladder is unremarkable. Stomach/Bowel: No evidence of bowel obstruction. Vascular/Lymphatic: No significant vascular findings are present. No enlarged abdominal or pelvic lymph nodes. Reproductive: Prostate is unremarkable. Other: At the level of the indwelling right upper quadrant percutaneous drain, the collection of predominantly air as well as fluid present previously has been decompressed. There remains a tiny rim of fluid as well as small bubbles of extraluminal air that extend to abut the superior liver. At the level of the right  pelvic drainage catheter, the residual component of abscess shows some decrease in size since the prior study on 09/06 with some fluid remaining around the pigtail portion of the drainage catheter. The area of fluid now measures roughly 4 x 6 cm compared to 5 x 8 cm previously. No new abscess identified. Musculoskeletal: No acute or significant osseous findings. IMPRESSION: 1. Decompression of right upper quadrant air and fluid collection after percutaneous catheter drainage. There remains a tiny rim of fluid as well as small extraluminal gas bubbles in this region suspicious for  continued leak from bowel. 2. Further decrease in size of residual abscess fluid collection in the pelvis after percutaneous catheter drainage. There remains some fluid around the drainage catheter in the right pelvis. Electronically Signed   By: Aletta Edouard M.D.   On: 06/20/2018 17:26   Ct Abdomen Pelvis W Contrast  Result Date: 06/16/2018 CLINICAL DATA:  F/u abdominal drain placed 9/3. Pt with recent surgery, Fever post-op "Colon cancer, abdominal fluid collection EXAM: CT ABDOMEN AND PELVIS WITH CONTRAST TECHNIQUE: Multidetector CT imaging of the abdomen and pelvis was performed using the standard protocol following bolus administration of intravenous contrast. CONTRAST:  132mL OMNIPAQUE IOHEXOL 300 MG/ML  SOLN COMPARISON:  06/13/2013 FINDINGS: Lower chest: Moderate right and trace left pleural effusions as before. Atelectasis/consolidation posteriorly in the visualized lung bases. No pneumothorax. Previous median sternotomy and CABG. Hepatobiliary: No focal liver abnormality is seen. No gallstones, gallbladder wall thickening, or biliary dilatation. Pancreas: Unremarkable. No pancreatic ductal dilatation or surrounding inflammatory changes. Spleen: Punctate calcified granulomas.  Normal size and enhancement. Adrenals/Urinary Tract: Stable 2.2 cm left adrenal nodule. Right adrenal unremarkable. Stable left renal cyst. No hydronephrosis. Urinary bladder incompletely distended. Stomach/Bowel: Stomach is partially distended. Small bowel nondilated. Some circumferential wall thickening in nondilated small bowel loops in the right lateral abdomen below the gallbladder. Changes of right hemicolectomy. Residual colon is nondistended. Vascular/Lymphatic: Heavy aortoiliac arterial calcifications without aneurysm. Portal vein patent. No abdominal or pelvic adenopathy. Reproductive: Mild prostatic enlargement. Other: Stable right lower quadrant pigtail drain catheter. Interval decrease in size of residual collection  measuring approximately 7.8 x 5.1 x3 cm (previously 14.7 x 11.6 x8.9). There has been interval increase in size of loculated gas and fluid collection, right subphrenic extending anteriorly in the right upper quadrant, measuring approximately 16.6 x 7.1 x17.7cm (previously 16.2 x 3.4 x 14.9cm). No new intra-abdominal collections. Musculoskeletal: Previous lower lumbar decompression surgery. Spondylitic changes in the visualized lower thoracic and lumbar spine. Negative for fracture or worrisome bone lesion. IMPRESSION: 1. Partial resolution of right lower quadrant collection post percutaneous drainage. The drain catheter is well positioned in the residual collection. 2. Enlarging right subphrenic and right upper quadrant gas and fluid collection. This would be approachable for percutaneous drainage if needed. Electronically Signed   By: Lucrezia Europe M.D.   On: 06/16/2018 13:35   Ct Abdomen Pelvis W Contrast  Result Date: 06/12/2018 CLINICAL DATA:  RIGHT colectomy 05/19/2018 for colon adenocarcinoma. Suspicion pneumoperitoneum 1 chest radiograph EXAM: CT CHEST, ABDOMEN, AND PELVIS WITH CONTRAST TECHNIQUE: Multidetector CT imaging of the chest, abdomen and pelvis was performed following the standard protocol during bolus administration of intravenous contrast. CONTRAST:  145mL OMNIPAQUE IOHEXOL 300 MG/ML  SOLN COMPARISON:  Radiograph 06/09/2018, CT 05/18/2018 FINDINGS: CT CHEST FINDINGS Cardiovascular: Post CABG anatomy. No large pulmonary emboli identified. No pericardial effusion. Mediastinum/Nodes: No axillary supraclavicular adenopathy. No mediastinal hilar adenopathy. Esophagus normal. Lungs/Pleura: Moderate sized RIGHT pleural effusion with basilar atelectasis. Mild LEFT basilar  atelectasis. No pneumonia. No pneumothorax. No pulmonary edema. Musculoskeletal: Degenerate spurring of the spine. Midline sternotomy. CT ABDOMEN AND PELVIS FINDINGS Hepatobiliary: Extensive extraperitoneal free air position anterior to  the liver and lateral to the liver. No focal hepatic lesion. Gallbladder collapsed. Portal veins patent. Pancreas: Pancreas is normal. No ductal dilatation. No pancreatic inflammation. Spleen: Normal spleen Adrenals/urinary tract: Stable nodule of the LEFT adrenal gland measures 2.2 cm. Previously characterized as adenoma. Stomach/Bowel: Stomach, duodenum, small-bowel appear normal. RIGHT hemicolectomy anatomy. No obstruction at the anastomosis. Oral contrast passes through the small bowel into the transverse colon through the anastomosis without gross evidence of leak. There is however large volume intraperitoneal free air in the RIGHT upper quadrant. This suggest perforated bowel or anastomotic breakdown. The transverse and descending colon are normal. Rectum appears normal. Within the central lower RIGHT abdomen large fluid collection measuring 14.5 by 11.6 cm. This collection is homogeneous low simple fluid attenuation cyst in centrally with a thin enhancing rim consistent with peritoneal abscess. (Image 101/2). The collection has a thin arm which extends to the LEFT pericolic gutter. No significant free fluid in the pelvis. Small amount free fluid anterior to the RIGHT hepatic lobe also with a peritoneal enhancement (image 72/2). Vascular/Lymphatic: Abdominal aorta is normal caliber with atherosclerotic calcification. There is no retroperitoneal or periportal lymphadenopathy. No pelvic lymphadenopathy. Reproductive: Prostate normal Other: Large intraperitoneal free air in the RIGHT upper quadrant described in the stomach bowel section. Large peritoneal abscess as described in the bowel section. Musculoskeletal: No aggressive osseous lesion. IMPRESSION: 1. Evidence all bowel perforation/anastomosed breakdown with large volume intraperitoneal free air fluid in the RIGHT upper quadrant adjacent and above the liver. 2. Large loculated fluid collection in the RIGHT anterior pelvis extending along the LEFT pericolic  gutter consists with PERITONEAL ABSCESS. 3. Of note, no large volume intraperitoneal free fluid. Oral contrast passed through the RIGHT colon anastomosis without evidence of leak. Findings could indicate a prior anastomotic breakdown which has healed in the interval with subsequent abscess formation from the prior leak / breakdown. Critical Value/emergent results were called by telephone at the time of interpretation on 06/12/2018 at 3:12 pm to Dr. Marylu Lund , who verbally acknowledged these results. Electronically Signed   By: Suzy Bouchard M.D.   On: 06/12/2018 15:12   Dg Chest Port 1 View  Result Date: 06/15/2018 CLINICAL DATA:  Dyspnea EXAM: PORTABLE CHEST 1 VIEW COMPARISON:  Chest x-rays dated 06/09/2018 and 05/22/2018. FINDINGS: Heart size and mediastinal contours are stable. Lungs are clear. No pleural effusion or pneumothorax seen. Diminished free intraperitoneal air under the RIGHT hemidiaphragm IMPRESSION: 1. No evidence of acute cardiopulmonary abnormality. No evidence of pneumonia or pulmonary edema. 2. Decreased free intraperitoneal air under the RIGHT hemidiaphragm. Electronically Signed   By: Franki Cabot M.D.   On: 06/15/2018 10:18   Dg Chest Port 1 View  Result Date: 06/09/2018 CLINICAL DATA:  Shortness of breath. EXAM: PORTABLE CHEST 1 VIEW COMPARISON:  Radiograph of June 06, 2018 and June 02, 2018. FINDINGS: Stable cardiomediastinal silhouette. Status post coronary bypass graft. Atherosclerosis of thoracic aorta is noted. No pneumothorax is noted. Lungs are clear. No significant pleural effusion is noted. There is recurrence of pneumoperitoneum underneath the right hemidiaphragm. Bony thorax is unremarkable. IMPRESSION: No acute cardiopulmonary abnormality seen. Recurrence of pneumoperitoneum is noted under right hemidiaphragm concerning for postoperative change or rupture of hollow viscus. These results will be called to the ordering clinician or representative by the Radiologist  Assistant, and communication documented  in the PACS or zVision Dashboard. Aortic Atherosclerosis (ICD10-I70.0). Electronically Signed   By: Marijo Conception, M.D.   On: 06/09/2018 11:59   Dg Chest Port 1 View  Result Date: 06/06/2018 CLINICAL DATA:  Shortness of breath, hypertension, CABG in May 2018, colonic malignancy, former smoker. EXAM: PORTABLE CHEST 1 VIEW COMPARISON:  PA and lateral chest x-ray of June 02, 2018 FINDINGS: The free subdiaphragmatic bowel gas seen previously has resolved. The lungs are borderline hypoinflated. The lung markings are coarse at both bases with possible small bilateral pleural effusions. There is no pleural effusion. The heart and pulmonary vascularity are normal. There are post CABG changes. There is calcification in the wall of the aortic arch. The bony thorax exhibits no acute abnormality. IMPRESSION: Borderline hypoinflation. Probable bibasilar atelectasis and small effusions greatest on the right. No CHF. When the patient can tolerate the procedure, a PA and lateral chest x-ray with deep inspiratory effort would be useful. Thoracic aortic atherosclerosis. Electronically Signed   By: David  Martinique M.D.   On: 06/06/2018 10:05   Dg Abd Acute W/chest  Result Date: 06/19/2018 CLINICAL DATA:  Abdominal pain EXAM: DG ABDOMEN ACUTE W/ 1V CHEST COMPARISON:  06/18/2018 FINDINGS: Cardiac shadow is mildly enlarged but stable. Postoperative changes are seen. Aortic calcifications are again identified. Small bilateral pleural effusions are noted. Right upper quadrant drainage catheter is again noted and stable. Scattered large and small bowel gas is seen. Right lower quadrant drainage catheter is also noted and stable. Degenerative changes of lumbar spine are seen. No free air is noted. No other focal abnormality is seen. IMPRESSION: Drainage catheters within the abdomen consistent with the given clinical history. Bilateral small effusions. Electronically Signed   By: Inez Catalina  M.D.   On: 06/19/2018 11:18   Dg Abd Portable 1v  Result Date: 05/26/2018 CLINICAL DATA:  Postop ileus EXAM: PORTABLE ABDOMEN - 1 VIEW COMPARISON:  05/25/2018 FINDINGS: NG tube remains present with the tip likely in the descending duodenum. Diffuse gaseous distention of bowel again noted, not significantly changed, likely ileus. No visible free air or organomegaly. IMPRESSION: Stable ileus pattern. Electronically Signed   By: Rolm Baptise M.D.   On: 05/26/2018 08:54   Dg Abd Portable 1v  Result Date: 05/25/2018 CLINICAL DATA:  Postoperative ileus. EXAM: PORTABLE ABDOMEN - 1 VIEW COMPARISON:  Radiographs of January 22, 2018. FINDINGS: Nasogastric tube tip is seen in expected position of distal stomach. Continued presence of small bowel dilatation is noted without significant colonic dilatation. IMPRESSION: Continued small bowel dilatation is noted which may represent postoperative ileus, but follow-up radiographs are recommended to rule out distal small bowel obstruction. Electronically Signed   By: Marijo Conception, M.D.   On: 05/25/2018 09:28   Dg Abd Portable 1v  Result Date: 05/24/2018 CLINICAL DATA:  Abdominal distention, postop ileus EXAM: PORTABLE ABDOMEN - 1 VIEW COMPARISON:  05/23/2018 FINDINGS: NG tube has likely passed into the proximal descending duodenum. Continued diffuse gaseous distention of bowel, likely ileus. No visible free air organomegaly. IMPRESSION: Stable diffuse significant gaseous distention of bowel, likely ileus. Electronically Signed   By: Rolm Baptise M.D.   On: 05/24/2018 09:30   Ct Image Guided Drainage By Percutaneous Catheter  Result Date: 06/17/2018 CLINICAL DATA:  Colon carcinoma, post partial colectomy. Enlarging right upper quadrant gas and fluid collection. EXAM: CT GUIDED DRAINAGE OF RIGHT UPPER QUADRANT PERITONEAL ABSCESS ANESTHESIA/SEDATION: None required PROCEDURE: The procedure, risks, benefits, and alternatives were explained to the patient. Questions  regarding  the procedure were encouraged and answered. The patient understands and consents to the procedure. Select axial scans through the upper abdomen obtained. The collection was localized and an appropriate skin entry site was determined and marked. The operative field was prepped with chlorhexidinein a sterile fashion, and a sterile drape was applied covering the operative field. A sterile gown and sterile gloves were used for the procedure. Local anesthesia was provided with 1% Lidocaine. Under CT fluoroscopic guidance, a 19 gauge percutaneous entry needle was advanced to the collection. Gas could be aspirated. An Amplatz guidewire advanced easily, its position confirmed on CT. Tract dilated to facilitate placement of a 12 French pigtail drain catheter, directed cephalad towards the subphrenic component of the collection. A sample of the thin cloudy yellow aspirate was sent for Gram stain and culture. Catheter secured externally with 0 Prolene suture and StatLock and placed to gravity drain bag. The patient tolerated the procedure well. COMPLICATIONS: None immediate FINDINGS: Gas and fluid collection in the right upper anterior peritoneal cavity just inferior to the liver was again localized. 12 French pigtail drain catheter placed as above. Sample of the aspirate sent for Gram stain and culture. IMPRESSION: 1. Technically successful CT-guided right upper quadrant peritoneal abscess drain catheter placement. Electronically Signed   By: Lucrezia Europe M.D.   On: 06/17/2018 13:35   Ct Image Guided Drainage By Percutaneous Catheter  Result Date: 06/13/2018 INDICATION: 84 year old male with a history of right lower quadrant fluid collection. He has been referred for drainage. EXAM: CT-GUIDED DRAINAGE OF RIGHT LOWER QUADRANT FLUID COLLECTION MEDICATIONS: The patient is currently admitted to the hospital and receiving intravenous antibiotics. The antibiotics were administered within an appropriate time frame prior  to the initiation of the procedure. ANESTHESIA/SEDATION: 2.0 mg IV Versed 100 mcg IV Fentanyl Moderate Sedation Time:  22 minutes The patient was continuously monitored during the procedure by the interventional radiology nurse under my direct supervision. COMPLICATIONS: None TECHNIQUE: Informed written consent was obtained from the patient after a thorough discussion of the procedural risks, benefits and alternatives. All questions were addressed. Maximal Sterile Barrier Technique was utilized including caps, mask, sterile gowns, sterile gloves, sterile drape, hand hygiene and skin antiseptic. A timeout was performed prior to the initiation of the procedure. PROCEDURE: The right lower quadrant was prepped with chlorhexidine in a sterile fashion, and a sterile drape was applied covering the operative field. A sterile gown and sterile gloves were used for the procedure. Local anesthesia was provided with 1% Lidocaine. Once the patient is prepped and draped in the usual sterile fashion, 1% lidocaine was used for local anesthesia. Using CT guidance, trocar needle was advanced into the fluid collection of the right lower quadrant. Modified Seldinger technique was used to place a 12 [place] drain. Drain was sutured in position. Approximately 450 cc of murky yellow fluid aspirated sample was sent for culture. Drain was attached to gravity drainage. Patient tolerated the procedure well and remained hemodynamically stable throughout. No complications were encountered and no significant blood loss. FINDINGS: CT again demonstrates fluid collection the right lower quadrant. Status post drain placement, there is significant reduction in fluid after aspiration of 450 cc of fluid. IMPRESSION: Status post drain placement of right lower quadrant fluid collection. Signed, Dulcy Fanny. Dellia Nims, RPVI [facility] Electronically Signed   By: Corrie Mckusick D.O.   On: 06/13/2018  17:17   US Thoracentesis Asp Pleural Space W/img Guide  Result Date: 06/17/2018 INDICATION: Since surgery for  colon cancer. Right pleural effusion. Request for diagnostic and therapeutic thoracentesis. EXAM: ULTRASOUND GUIDED RIGHT THORACENTESIS MEDICATIONS: 1% lidocaine 10 mL COMPLICATIONS: None immediate. PROCEDURE: An ultrasound guided thoracentesis was thoroughly discussed with the patient and questions answered. The benefits, risks, alternatives and complications were also discussed. The patient understands and wishes to proceed with the procedure. Written consent was obtained. Ultrasound was performed to localize and mark an adequate pocket of fluid in the right chest. The area was then prepped and draped in the normal sterile fashion. 1% Lidocaine was used for local anesthesia. Under ultrasound guidance a 6 Fr Safe-T-Centesis catheter was introduced. Thoracentesis was performed. The catheter was removed and a dressing applied. FINDINGS: A total of approximately 1 L of clear yellow fluid was removed. Samples were sent to the laboratory as requested by the clinical team. IMPRESSION: Successful ultrasound guided right thoracentesis yielding 1 L of pleural fluid. No pneumothorax on post-procedure chest x-ray. Read by: Gareth Eagle, PA-C Electronically Signed   By: Corrie Mckusick D.O.   On: 06/17/2018 10:13       Subjective: Patient seen and examined the bedside this morning.  Remains comfortable.  Still has some productive cough.  Denies any abdominal pain, nausea or vomiting.  Discharge Exam: Vitals:   06/22/18 0745 06/22/18 0903  BP: 127/70   Pulse: 95   Resp: (!) 22   Temp: 99.1 F (37.3 C)   SpO2: 95% 97%   Vitals:   06/21/18 2042 06/22/18 0427 06/22/18 0745 06/22/18 0903  BP:  118/70 127/70   Pulse:  88 95   Resp:  20 (!) 22   Temp:  98.4 F (36.9 C) 99.1 F (37.3 C)   TempSrc:  Oral Oral   SpO2: 98% 98% 95% 97%  Weight:  98.8 kg    Height:        General: Pt is alert,  awake, not in acute distress Cardiovascular: RRR, S1/S2 +, no rubs, no gallops Respiratory: CTA bilaterally, no wheezing, no rhonchi Abdominal: Soft, NT, ND, bowel sounds +, right upper quadrant and right lower quadrant drains Extremities: no edema, no cyanosis    The results of significant diagnostics from this hospitalization (including imaging, microbiology, ancillary and laboratory) are listed below for reference.     Microbiology: Recent Results (from the past 240 hour(s))  Aerobic/Anaerobic Culture (surgical/deep wound)     Status: None   Collection Time: 06/13/18  5:10 PM  Result Value Ref Range Status   Specimen Description   Final    ABDOMEN Performed at [facility] [address], [place], [place] [address]    Special Requests   Final    NONE Performed at [facility], [place] [address] [place], [place] [address]    Gram Stain   Final    ABUNDANT WBC PRESENT,BOTH PMN AND MONONUCLEAR FEW GRAM NEGATIVE RODS    Culture   Final    MODERATE BACTEROIDES FRAGILIS BETA LACTAMASE POSITIVE NO GROWTH  AEROBICALLY Performed at [facility], [place] [address], [place], [place] [address]    Report Status 06/16/2018 FINAL  Final  Urine Culture     Status: None   Collection Time: 06/16/18 10:30 AM  Result Value Ref Range Status   Specimen Description   Final    URINE, CLEAN CATCH Performed at [facility], [place] [address] [place], [place] [address]    Special Requests   Final    NONE Performed at [facility], [address] [place], [place] [address]    Culture   Final    NO GROWTH Performed at [facility], [place] [name] [place], [place] [address]    Report Status 06/17/2018 FINAL  Final  Body fluid culture     Status: None   Collection Time: 06/17/18  8:55 AM  Result Value Ref Range Status   Specimen Description   Final    PLEURAL RIGHT Performed at [facility] [address] [place], [place] [address]    Special Requests   Final    NONE Performed at [facility], [place] [address], [place], [place] [address]    Gram Stain   Final    ABUNDANT WBC PRESENT,BOTH PMN AND MONONUCLEAR NO ORGANISMS SEEN    Culture   Final    NO GROWTH 3 DAYS Performed at [facility], [place] [address], [place], [place] [address]    Report Status 06/20/2018 FINAL  Final  Aerobic/Anaerobic Culture (surgical/deep wound)     Status: Abnormal (Preliminary result)   Collection Time: 06/17/18 10:24 AM  Result Value Ref Range Status   Specimen Description   Final    ABSCESS RUQ Performed at [facility] [address] [place], [place] [address]    Special Requests   Final    NONE Performed at [facility], [place] [address] [place], [place] [address]    Gram Stain   Final    ABUNDANT WBC PRESENT, PREDOMINANTLY PMN ABUNDANT GRAM NEGATIVE RODS MODERATE GRAM POSITIVE RODS FEW GRAM POSITIVE COCCI IN PAIRS RARE BUDDING YEAST SEEN Performed at [facility], [place] [address] [place], [place] [address]    Culture (A)  Final    MULTIPLE ORGANISMS PRESENT, NONE PREDOMINANT NO ANAEROBES ISOLATED; CULTURE IN PROGRESS FOR 5 DAYS    Report Status PENDING  Incomplete  Expectorated sputum assessment w rflx to resp cult     Status: None   Collection Time: 06/21/18  7:23 AM  Result Value Ref Range Status   Specimen Description SPUTUM  Final   Special Requests Normal  Final   Sputum evaluation   Final    Sputum specimen not acceptable for testing.  Please recollect.   INFORMED RN @0738  ON 06/21/18 BY [facility] Performed at [facility], [place] [address] [place], [place] [address]    Report Status 06/21/2018 FINAL  Final  Expectorated sputum assessment w rflx to resp cult     Status: None   Collection Time: 06/21/18  2:19 PM  Result Value Ref Range Status   Specimen Description  EXPECTORATED SPUTUM  Final   Special Requests NONE  Final   Sputum evaluation   Final    Sputum specimen not acceptable for testing.  Please recollect.   NOTIFIED A.[facility] RN 06/21/2018 1642 JR Performed at [facility], [place] [address], [place], [place] [address]    Report Status 06/21/2018 FINAL  Final     Labs: BNP (last 3 results) Recent Labs    06/02/18 1801  BNP 010.9*   Basic Metabolic Panel: Recent Labs  Lab 06/16/18 0413 06/17/18 0506 06/18/18 0430 06/19/18 0429 06/21/18 0422  NA 141 137 140 139 139  K 4.2 4.5 3.8 4.0 4.2  CL 98 98 99 95* 95*  CO2 34* 31 32 34* 35*  GLUCOSE 119* 146* 115* 126* 104*  BUN 19 20 27* 22 18  CREATININE 1.22 1.35* 1.37* 0.99 0.86  CALCIUM 8.6* 8.4* 8.4* 8.2* 8.4*   Liver Function Tests: No results for input(s): AST, ALT, ALKPHOS, BILITOT, PROT, ALBUMIN in the last 168 hours. No results for input(s): LIPASE, AMYLASE in the last 168 hours. No results for input(s): AMMONIA in the last 168 hours. CBC: Recent Labs  Lab 06/16/18 0413 06/17/18 0506 06/18/18 0430 06/19/18 0429 06/21/18 0422  WBC 12.9* 17.9* 12.8* 8.3 7.8  NEUTROABS 11.3* 15.9* 11.3* 7.2 6.6  HGB 9.8* 9.5* 9.4* 9.4* 8.8*  HCT 32.0* 30.6* 29.9* 30.3* 28.3*  MCV 89.9 89.0 89.0 88.6 87.9  PLT 308 291 269 276 297   Cardiac Enzymes: Recent Labs  Lab 06/19/18 1041 06/19/18 1611 06/19/18 2155  TROPONINI <0.03 <0.03 <0.03   BNP: Invalid input(s): POCBNP CBG: No results for input(s): GLUCAP in the last 168 hours. D-Dimer No results for input(s): DDIMER in the last 72 hours. Hgb A1c No results for input(s): HGBA1C in the last 72 hours. Lipid Profile No results for input(s): CHOL, HDL, LDLCALC, TRIG, CHOLHDL, LDLDIRECT in the last 72 hours. Thyroid function studies No results for input(s): TSH, T4TOTAL, T3FREE, THYROIDAB in the last 72 hours.  Invalid input(s): FREET3 Anemia work up No results for input(s): VITAMINB12, FOLATE, FERRITIN,  TIBC, IRON, RETICCTPCT in the last 72 hours. Urinalysis    Component Value Date/Time   COLORURINE AMBER (A) 06/16/2018 1030   APPEARANCEUR CLEAR 06/16/2018 1030   LABSPEC 1.025 06/16/2018 1030   PHURINE 5.0 06/16/2018 1030   GLUCOSEU NEGATIVE 06/16/2018 1030   GLUCOSEU NEGATIVE 08/11/2016 1521   HGBUR NEGATIVE 06/16/2018 1030   BILIRUBINUR NEGATIVE 06/16/2018 1030   KETONESUR 5 (A) 06/16/2018 1030   PROTEINUR 100 (A) 06/16/2018 1030   UROBILINOGEN 1.0 08/11/2016 1521   NITRITE NEGATIVE 06/16/2018 1030   LEUKOCYTESUR NEGATIVE 06/16/2018 1030   Sepsis Labs Invalid input(s): PROCALCITONIN,  WBC,  LACTICIDVEN Microbiology Recent Results (from the past 240 hour(s))  Aerobic/Anaerobic Culture (surgical/deep wound)     Status: None   Collection Time: 06/13/18  5:10 PM  Result Value Ref Range Status   Specimen Description   Final    ABDOMEN Performed at [facility] [address], [place], [place] [address]    Special Requests   Final    NONE Performed at [facility], [place] [address], [place], [place] [address]    Gram Stain   Final    ABUNDANT WBC PRESENT,BOTH PMN AND MONONUCLEAR FEW GRAM NEGATIVE RODS    Culture   Final    MODERATE BACTEROIDES FRAGILIS BETA LACTAMASE POSITIVE NO GROWTH  AEROBICALLY Performed at [facility], [place] [address], [place], [place] [address]    Report Status 06/16/2018 FINAL  Final  Urine Culture     Status: None   Collection Time: 06/16/18 10:30 AM  Result Value Ref Range Status   Specimen Description   Final    URINE, CLEAN CATCH Performed at [facility], [place] [address], [place], [place] [address]    Special Requests   Final    NONE Performed at [facility], [place] [address] [place], [place] [address]    Culture   Final    NO GROWTH Performed at [facility], [place] [address], [place], [place] [address]    Report Status 06/17/2018 FINAL  Final   Body fluid culture     Status: None   Collection Time: 06/17/18  8:55 AM  Result Value Ref Range Status   Specimen Description  Final    PLEURAL RIGHT Performed at [facility], [place] [address] [place], [place] [address]    Special Requests   Final    NONE Performed at [facility], [place] [address], [place], [place] [address]    Gram Stain   Final    ABUNDANT WBC PRESENT,BOTH PMN AND MONONUCLEAR NO ORGANISMS SEEN    Culture   Final    NO GROWTH 3 DAYS Performed at [facility], [place] [address], [place], [place] [address]    Report Status 06/20/2018 FINAL  Final  Aerobic/Anaerobic Culture (surgical/deep wound)     Status: Abnormal (Preliminary result)   Collection Time: 06/17/18 10:24 AM  Result Value Ref Range Status   Specimen Description   Final    ABSCESS RUQ Performed at [place] [address] [place], [place] [address]    Special Requests   Final    NONE Performed at [facility], [place] [address] [place], [place] [address]    Gram Stain   Final    ABUNDANT WBC PRESENT, PREDOMINANTLY PMN ABUNDANT GRAM NEGATIVE RODS MODERATE GRAM POSITIVE RODS FEW GRAM POSITIVE COCCI IN PAIRS RARE BUDDING YEAST SEEN Performed at [facility], [place] [address], [place], [place] [address]    Culture (A)  Final    MULTIPLE ORGANISMS PRESENT, NONE PREDOMINANT NO ANAEROBES ISOLATED; CULTURE IN PROGRESS FOR 5 DAYS    Report Status PENDING  Incomplete  Expectorated sputum assessment w rflx to resp cult     Status: None   Collection Time: 06/21/18  7:23 AM  Result Value Ref Range Status   Specimen Description SPUTUM  Final   Special Requests Normal  Final   Sputum evaluation   Final    Sputum specimen not acceptable for testing.  Please recollect.   INFORMED RN @0738  ON 06/21/18 BY [facility] Performed at [facility], [place] [address] [place], [place] [address]    Report Status  06/21/2018 FINAL  Final  Expectorated sputum assessment w rflx to resp cult     Status: None   Collection Time: 06/21/18  2:19 PM  Result Value Ref Range Status   Specimen Description EXPECTORATED SPUTUM  Final   Special Requests NONE  Final   Sputum evaluation   Final    Sputum specimen not acceptable for testing.  Please recollect.   NOTIFIED A.[facility] RN 06/21/2018 1642 JR Performed at [facility], [place] [address], [place], [place] [address]    Report Status 06/21/2018 FINAL  Final    Please note: You were cared for by a hospitalist during your hospital stay. Once you are discharged, your primary care physician will handle any further medical issues. Please note that NO REFILLS for any discharge medications will be authorized once you are discharged, as it is imperative that you return to your primary care physician (or establish a relationship with a primary care physician if you do not have one) for your post hospital discharge needs so that they can reassess your need for medications and monitor your lab values.    Time coordinating discharge: 40 minutes  SIGNED:   Shelly Coss, MD  [facility] 06/22/2018, 10:10 AM Pager [phone]  If 7PM-7AM, please contact night-coverage [email or web address] Password [email or web address]

## 2018-06-22 NOTE — Discharge Instructions (Signed)
Flush RLQ drain with 5cc of normal saline daily.  RUQ drain does NOT need to be flushed per IR. Pack midline wound with a normal saline moistened gauze and cover with a dry gauze daily.

## 2018-06-22 NOTE — Clinical Social Work Placement (Signed)
Pt admitting to [facility]- report # [phone]. Will arrange PTAR transportation- awaiting wife's arrival to hospital, requests to be present for DC. DC information provided via the [facility].   CLINICAL SOCIAL WORK PLACEMENT  NOTE  Date:  06/22/2018  Patient Details  Name: Vincent Mcclure MRN: [id number] Date of Birth: 29-Jun-1932  Clinical Social Work is seeking post-discharge placement for this patient at the [place] level of care (*CSW will initial, date and re-position this form in  chart as items are completed):  Yes   Patient/family provided with [facility] Work Department's list of facilities offering this level of care within the geographic area requested by the patient (or if unable, by the patient's family).  Yes   Patient/family informed of their freedom to choose among providers that offer the needed level of care, that participate in Medicare, Medicaid or managed care program needed by the patient, have an available bed and are willing to accept the patient.  Yes   Patient/family informed of [facility]'s ownership interest in [facility] and [facility], as well as of the fact that they are under no obligation to receive care at these facilities.  PASRR submitted to EDS on       PASRR number received on       Existing PASRR number confirmed on 06/05/18     FL2 transmitted to all facilities in geographic area requested by pt/family on 06/05/18     FL2 transmitted to all facilities within larger geographic area on       Patient informed that his/her managed care company has contracts with or will negotiate with certain facilities, including the following:        Yes   Patient/family informed of bed offers received.  Patient chooses bed at [facility], [place]     Physician recommends and patient chooses bed at [place], [place]    Patient to be transferred to [facility] [place] on  06/08/18.  Patient to be transferred to facility by PTAR     Patient family notified on 06/08/18 of transfer.  Name of family member notified:  wife [place]     PHYSICIAN       Additional Comment:    _______________________________________________ Nila Nephew, LCSW 06/22/2018, 10:52 AM [phone]

## 2018-06-22 NOTE — Consult Note (Signed)
   [facility] Inpatient Consult   06/22/2018  Vincent Mcclure Feb 03, 1932 [id number]    [facility] Care Management follow up. Chart reviewed. Noted [name] is slated for discharge today to [facility]. Will make referral to [facility] for follow up while at [facility]. Will update [facility] NP of disposition plan.    Marthenia Rolling, MSN-Ed, RN,BSN [facility] Liaison [phone]

## 2018-06-22 NOTE — Progress Notes (Signed)
Patient ID: Vincent Mcclure, male   DOB: 03-Nov-1931, 84 y.o.   MRN: [id number]       Subjective: Pt tolerating a diet.   Objective: Vital signs in last 24 hours: Temp:  [98.1 F (36.7 C)-99.1 F (37.3 C)] 99.1 F (37.3 C) (09/12 0745) Pulse Rate:  [87-98] 95 (09/12 0745) Resp:  [18-22] 22 (09/12 0745) BP: (118-128)/(68-76) 127/70 (09/12 0745) SpO2:  [95 %-100 %] 97 % (09/12 0903) Weight:  [98.8 kg] 98.8 kg (09/12 0427) Last BM Date: 06/21/18  Intake/Output from previous day: 09/11 0701 - 09/12 0700 In: 59 [P.O.:480] Out: 1370 [Urine:1100; Drains:270] Intake/Output this shift: Total I/O In: 240 [P.O.:240] Out: -   PE: Abd: soft, NT, ND, RUQ with less output today than yesterday.  Still with bilious appearing output and purulence present.  RLQ drain with SS drainage.  Midline wound is clean  Phlebitis R arm  Lab Results:  Recent Labs    06/21/18 0422  WBC 7.8  HGB 8.8*  HCT 28.3*  PLT 297   BMET Recent Labs    06/21/18 0422  NA 139  K 4.2  CL 95*  CO2 35*  GLUCOSE 104*  BUN 18  CREATININE 0.86  CALCIUM 8.4*   PT/INR No results for input(s): LABPROT, INR in the last 72 hours. CMP     Component Value Date/Time   NA 139 06/21/2018 0422   NA 139 01/17/2018 1401   K 4.2 06/21/2018 0422   CL 95 (L) 06/21/2018 0422   CO2 35 (H) 06/21/2018 0422   GLUCOSE 104 (H) 06/21/2018 0422   BUN 18 06/21/2018 0422   BUN 13 01/17/2018 1401   CREATININE 0.86 06/21/2018 0422   CALCIUM 8.4 (L) 06/21/2018 0422   PROT 5.2 (L) 06/14/2018 0451   PROT 6.4 01/17/2018 1401   ALBUMIN 1.9 (L) 06/14/2018 0451   ALBUMIN 4.2 01/17/2018 1401   AST 20 06/14/2018 0451   ALT 26 06/14/2018 0451   ALKPHOS 79 06/14/2018 0451   BILITOT 0.4 06/14/2018 0451   BILITOT 0.3 01/17/2018 1401   GFRNONAA >60 06/21/2018 0422   GFRAA >60 06/21/2018 0422   Lipase     Component Value Date/Time   LIPASE 103 (H) 06/02/2018 1801       Studies/Results: Ct Abdomen Pelvis W  Contrast  Result Date: 06/20/2018 CLINICAL DATA:  Anastomotic leak after right hemicolectomy and status post placement right pelvic percutaneous drainage catheter into large abscess on 06/13/2018 and additional right upper quadrant percutaneous drain in to large cavity predominantly containing air on 06/17/2018. The right pelvic drainage catheter shows diminished output clinically. The right upper quadrant drainage catheter continues to have feculent output. EXAM: CT ABDOMEN AND PELVIS WITH CONTRAST TECHNIQUE: Multidetector CT imaging of the abdomen and pelvis was performed using the standard protocol following bolus administration of intravenous contrast. CONTRAST:  122mL OMNIPAQUE IOHEXOL 300 MG/ML  SOLN COMPARISON:  06/16/2018 FINDINGS: Lower chest: There is a small right pleural effusion and a trace amount of left pleural fluid. Bibasilar atelectasis present. Hepatobiliary: No focal liver abnormality is seen. No gallstones, gallbladder wall thickening, or biliary dilatation. Pancreas: Unremarkable. No pancreatic ductal dilatation or surrounding inflammatory changes. Spleen: Normal in size.  Stable calcified granulomata. Adrenals/Urinary Tract: Adrenal glands are unremarkable. Kidneys are normal, without renal calculi, focal lesion, or hydronephrosis. Bladder is unremarkable. Stomach/Bowel: No evidence of bowel obstruction. Vascular/Lymphatic: No significant vascular findings are present. No enlarged abdominal or pelvic lymph nodes. Reproductive: Prostate is unremarkable. Other: At the level of  the indwelling right upper quadrant percutaneous drain, the collection of predominantly air as well as fluid present previously has been decompressed. There remains a tiny rim of fluid as well as small bubbles of extraluminal air that extend to abut the superior liver. At the level of the right pelvic drainage catheter, the residual component of abscess shows some decrease in size since the prior study on 09/06 with some  fluid remaining around the pigtail portion of the drainage catheter. The area of fluid now measures roughly 4 x 6 cm compared to 5 x 8 cm previously. No new abscess identified. Musculoskeletal: No acute or significant osseous findings. IMPRESSION: 1. Decompression of right upper quadrant air and fluid collection after percutaneous catheter drainage. There remains a tiny rim of fluid as well as small extraluminal gas bubbles in this region suspicious for continued leak from bowel. 2. Further decrease in size of residual abscess fluid collection in the pelvis after percutaneous catheter drainage. There remains some fluid around the drainage catheter in the right pelvis. Electronically Signed   By: Aletta Edouard M.D.   On: 06/20/2018 17:26    Anti-infectives: Anti-infectives (From admission, onward)   Start     Dose/Rate Route Frequency Ordered Stop   06/17/18 1200  Ampicillin-Sulbactam (UNASYN) 3 g in sodium chloride 0.9 % 100 mL IVPB  Status:  Discontinued     3 g 200 mL/hr over 30 Minutes Intravenous Every 6 hours 06/17/18 1148 06/21/18 0800   06/10/18 1200  vancomycin (VANCOCIN) 1,500 mg in sodium chloride 0.9 % 500 mL IVPB  Status:  Discontinued     1,500 mg 250 mL/hr over 120 Minutes Intravenous Every 24 hours 06/09/18 1055 06/12/18 0819   06/09/18 1200  ceFEPIme (MAXIPIME) 1 g in sodium chloride 0.9 % 100 mL IVPB  Status:  Discontinued     1 g 200 mL/hr over 30 Minutes Intravenous Every 8 hours 06/09/18 1023 06/17/18 1148   06/09/18 1100  vancomycin (VANCOCIN) 2,000 mg in sodium chloride 0.9 % 500 mL IVPB     2,000 mg 250 mL/hr over 120 Minutes Intravenous  Once 06/09/18 1023 06/09/18 1349       Assessment/Plan History of alcohol use Hx of hypertension Hx of hyperlipidemia  Readmit 12/24/1759 Acute diastolicCHFexacerbation, HCAP Atrial fibrillation with history of RVR-no anticoagulation secondary to prior bleeding Failure to thrive/deconditioning  Obstructing colon  mass/adenocarcinoma 1/8positive nodes S/P exploratory laparotomy, right hemicolectomy with primary anastomosis, 05/19/2018 -Dr. Fanny Skates -Intra-abdominal abscess, secondary to anastomotic leak -s/p perc drain 9/3 in IRfor RLQ and 9/7 for RUQ -RUQ drain with feculent output.  Upon further review of his imaging, this appears to be secondary to an anastomotic leak.  Cont drain for now and follow output to determine when this closes down.   -cont midline dressing changes BID -eating well.  FEN: HH diet, Ensure ID: , vancomycin 8/30>>9/2;merrem 8/30 =>>9/7, Unasyn 9/7 -->9/11 Follow up: Dr. Luna Kitchens clinic DVT: Lovenox Phlebitis: warm compresses Plan:cont RUQ drain for anastomotic leak.   Will need follow up with [name] and drain clinic at discharge.  [place] for d/c from surgical standpoint   LOS: 17 days    Rosario Adie, MD  [facility] [place] [facility]

## 2018-06-22 NOTE — Progress Notes (Signed)
PT demonstrated hands on understanding of Flutter device. 

## 2018-06-22 NOTE — Progress Notes (Signed)
Patient ID: Vincent Mcclure, male   DOB: October 02, 1932, 84 y.o.   MRN: [id number]       Subjective: Pt without new complaints today.  No other changes.  Tolerating a regular diet well.  Objective: Vital signs in last 24 hours: Temp:  [98.1 F (36.7 C)-99.1 F (37.3 C)] 99.1 F (37.3 C) (09/12 0745) Pulse Rate:  [87-98] 95 (09/12 0745) Resp:  [18-22] 22 (09/12 0745) BP: (118-128)/(68-76) 127/70 (09/12 0745) SpO2:  [95 %-100 %] 97 % (09/12 0903) Weight:  [98.8 kg] 98.8 kg (09/12 0427) Last BM Date: 06/21/18  Intake/Output from previous day: 09/11 0701 - 09/12 0700 In: 510 [P.O.:480] Out: 1370 [Urine:1100; Drains:270] Intake/Output this shift: Total I/O In: 240 [P.O.:240] Out: -   PE: Abd: soft, NT, RLQ drain says 95cc of output in bag yesterday, but minimal clear fluid today, still looks like flush.  RUQ drain is becoming less feculent and more serous looking with some fibrin present.  Still with air in bag.  Midline wound is still 100% clean with beefy red granulation tissue.  Lab Results:  Recent Labs    06/21/18 0422  WBC 7.8  HGB 8.8*  HCT 28.3*  PLT 297   BMET Recent Labs    06/21/18 0422  NA 139  K 4.2  CL 95*  CO2 35*  GLUCOSE 104*  BUN 18  CREATININE 0.86  CALCIUM 8.4*   PT/INR No results for input(s): LABPROT, INR in the last 72 hours. CMP     Component Value Date/Time   NA 139 06/21/2018 0422   NA 139 01/17/2018 1401   K 4.2 06/21/2018 0422   CL 95 (L) 06/21/2018 0422   CO2 35 (H) 06/21/2018 0422   GLUCOSE 104 (H) 06/21/2018 0422   BUN 18 06/21/2018 0422   BUN 13 01/17/2018 1401   CREATININE 0.86 06/21/2018 0422   CALCIUM 8.4 (L) 06/21/2018 0422   PROT 5.2 (L) 06/14/2018 0451   PROT 6.4 01/17/2018 1401   ALBUMIN 1.9 (L) 06/14/2018 0451   ALBUMIN 4.2 01/17/2018 1401   AST 20 06/14/2018 0451   ALT 26 06/14/2018 0451   ALKPHOS 79 06/14/2018 0451   BILITOT 0.4 06/14/2018 0451   BILITOT 0.3 01/17/2018 1401   GFRNONAA >60 06/21/2018 0422   GFRAA >60 06/21/2018 0422   Lipase     Component Value Date/Time   LIPASE 103 (H) 06/02/2018 1801       Studies/Results: Ct Abdomen Pelvis W Contrast  Result Date: 06/20/2018 CLINICAL DATA:  Anastomotic leak after right hemicolectomy and status post placement right pelvic percutaneous drainage catheter into large abscess on 06/13/2018 and additional right upper quadrant percutaneous drain in to large cavity predominantly containing air on 06/17/2018. The right pelvic drainage catheter shows diminished output clinically. The right upper quadrant drainage catheter continues to have feculent output. EXAM: CT ABDOMEN AND PELVIS WITH CONTRAST TECHNIQUE: Multidetector CT imaging of the abdomen and pelvis was performed using the standard protocol following bolus administration of intravenous contrast. CONTRAST:  152mL OMNIPAQUE IOHEXOL 300 MG/ML  SOLN COMPARISON:  06/16/2018 FINDINGS: Lower chest: There is a small right pleural effusion and a trace amount of left pleural fluid. Bibasilar atelectasis present. Hepatobiliary: No focal liver abnormality is seen. No gallstones, gallbladder wall thickening, or biliary dilatation. Pancreas: Unremarkable. No pancreatic ductal dilatation or surrounding inflammatory changes. Spleen: Normal in size.  Stable calcified granulomata. Adrenals/Urinary Tract: Adrenal glands are unremarkable. Kidneys are normal, without renal calculi, focal lesion, or hydronephrosis. Bladder is unremarkable. Stomach/Bowel:  No evidence of bowel obstruction. Vascular/Lymphatic: No significant vascular findings are present. No enlarged abdominal or pelvic lymph nodes. Reproductive: Prostate is unremarkable. Other: At the level of the indwelling right upper quadrant percutaneous drain, the collection of predominantly air as well as fluid present previously has been decompressed. There remains a tiny rim of fluid as well as small bubbles of extraluminal air that extend to abut the superior liver.  At the level of the right pelvic drainage catheter, the residual component of abscess shows some decrease in size since the prior study on 09/06 with some fluid remaining around the pigtail portion of the drainage catheter. The area of fluid now measures roughly 4 x 6 cm compared to 5 x 8 cm previously. No new abscess identified. Musculoskeletal: No acute or significant osseous findings. IMPRESSION: 1. Decompression of right upper quadrant air and fluid collection after percutaneous catheter drainage. There remains a tiny rim of fluid as well as small extraluminal gas bubbles in this region suspicious for continued leak from bowel. 2. Further decrease in size of residual abscess fluid collection in the pelvis after percutaneous catheter drainage. There remains some fluid around the drainage catheter in the right pelvis. Electronically Signed   By: Aletta Edouard M.D.   On: 06/20/2018 17:26    Anti-infectives: Anti-infectives (From admission, onward)   Start     Dose/Rate Route Frequency Ordered Stop   06/17/18 1200  Ampicillin-Sulbactam (UNASYN) 3 g in sodium chloride 0.9 % 100 mL IVPB  Status:  Discontinued     3 g 200 mL/hr over 30 Minutes Intravenous Every 6 hours 06/17/18 1148 06/21/18 0800   06/10/18 1200  vancomycin (VANCOCIN) 1,500 mg in sodium chloride 0.9 % 500 mL IVPB  Status:  Discontinued     1,500 mg 250 mL/hr over 120 Minutes Intravenous Every 24 hours 06/09/18 1055 06/12/18 0819   06/09/18 1200  ceFEPIme (MAXIPIME) 1 g in sodium chloride 0.9 % 100 mL IVPB  Status:  Discontinued     1 g 200 mL/hr over 30 Minutes Intravenous Every 8 hours 06/09/18 1023 06/17/18 1148   06/09/18 1100  vancomycin (VANCOCIN) 2,000 mg in sodium chloride 0.9 % 500 mL IVPB     2,000 mg 250 mL/hr over 120 Minutes Intravenous  Once 06/09/18 1023 06/09/18 1349       Assessment/Plan History of alcohol use Hx of hypertension Hx of hyperlipidemia  Readmit 1/61/0960 Acute diastolicCHFexacerbation,  HCAP Atrial fibrillation with history of RVR Failure to thrive/deconditioning  Obstructing colon mass/adenocarcinoma 1/8positive nodes S/P exploratory laparotomy, right hemicolectomy with primary anastomosis, 05/19/2018 -Dr. Fanny Skates -Intra-abdominal abscess, secondary to anastomotic leak -s/p perc drain 9/3 in IRfor RLQ and 9/7 for RUQ -RUQ drainbecoming less feculent and still with control of his leak with oral intake.  Surgically stable for DC to SNF -cont midline dressing changes BID -eating well.  FEN: HH diet, Ensure ID: , vancomycin 8/30>>9/2;merrem 8/30 =>>9/7, Unasyn 9/7 -->9/11 Follow up: Dr. Luna Kitchens clinic DVT: Lovenox  Plan:cont RUQ drain for anastomotic leak.  Will need follow up with [name] and drain clinic at discharge.  RN to page me when wife arrives so I can come up and speak to her.   LOS: 17 days    Henreitta Cea , [facility] 06/22/2018, 9:30 AM Pager: [phone]

## 2018-06-22 NOTE — Progress Notes (Signed)
Patient discharged to [facility]. Wife present during transfer. Report called to facility.

## 2018-06-22 NOTE — Progress Notes (Signed)
Physical Therapy Treatment Patient Details Name: Vincent Mcclure MRN: [id number] DOB: [birth year] Today's Date: 06/22/2018    History of Present Illness 84 y.o. male with medical history significant of CAD s/p CABG in 2018, PAF not on anticoagulation due to h/o GIB in past, and s/p exp lap with colectomy 05/19/18.  He was admitted from his follow up surgical appt due to weakness and congestion.     PT Comments    Pt assisted with ambulating short distance in hallway.  Pt able to perform  22 feet x2 today with standing rest break however HR elevated to 160 bpm (RN aware).  Pt plans to d/c to SNF today.   Follow Up Recommendations  SNF     Equipment Recommendations  None recommended by PT    Recommendations for Other Services       Precautions / Restrictions Precautions Precautions: Fall Precaution Comments: watch HR and sats; right sided drains Restrictions Weight Bearing Restrictions: No    Mobility  Bed Mobility                  Transfers                    Ambulation/Gait                 Stairs             Wheelchair Mobility    Modified Rankin (Stroke Patients Only)       Balance                                            Cognition Arousal/Alertness: Awake/alert Behavior During Therapy: WFL for tasks assessed/performed Overall Cognitive Status: Within Functional Limits for tasks assessed                                 General Comments: pleasant and cooperative      Exercises      General Comments        Pertinent Vitals/Pain Pain Assessment: 0-10 Pain Score: 4  Pain Location: abdomen near drain site/incision Pain Descriptors / Indicators: Grimacing;Tender Pain Intervention(s): Repositioned;Monitored during session;Limited activity within patient's tolerance    Home Living                      Prior Function            PT Goals (current goals can now be found in the  care plan section) Acute Rehab PT Goals PT Goal Formulation: With patient Time For Goal Achievement: 07/06/18 Potential to Achieve Goals: Good Progress towards PT goals: Progressing toward goals    Frequency    Min 3X/week      PT Plan Current plan remains appropriate    Co-evaluation              AM-PAC PT "6 Clicks" Daily Activity  Outcome Measure  Difficulty turning over in bed (including adjusting bedclothes, sheets and blankets)?: A Little Difficulty moving from lying on back to sitting on the side of the bed? : Unable Difficulty sitting down on and standing up from a chair with arms (e.g., wheelchair, bedside commode, etc,.)?: Unable Help needed moving to and from a bed to chair (including a wheelchair)?: A Little Help needed walking in hospital room?: A  Little Help needed climbing 3-5 steps with a railing? : A Lot 6 Click Score: 13    End of Session Equipment Utilized During Treatment: Gait belt;Oxygen Activity Tolerance: Patient limited by fatigue;Treatment limited secondary to medical complications (Comment)(elevated HR) Patient left: in chair;with call bell/phone within reach Nurse Communication: Mobility status(RN into room, telemetry called due to tachy) PT Visit Diagnosis: Muscle weakness (generalized) (M62.81);Difficulty in walking, not elsewhere classified (R26.2)     Time: 9563-8756 PT Time Calculation (min) (ACUTE ONLY): 19 min  Charges:  $Gait Training: 8-22 mins                    Carmelia Bake, PT, DPT Acute Rehabilitation Services Office: [phone] Pager: [phone]  Trena Platt 06/22/2018, 12:16 PM

## 2018-06-24 DIAGNOSIS — I4891 Unspecified atrial fibrillation: Secondary | ICD-10-CM | POA: Diagnosis not present

## 2018-06-24 DIAGNOSIS — Z9049 Acquired absence of other specified parts of digestive tract: Secondary | ICD-10-CM | POA: Diagnosis not present

## 2018-06-24 DIAGNOSIS — J189 Pneumonia, unspecified organism: Secondary | ICD-10-CM | POA: Diagnosis not present

## 2018-06-24 DIAGNOSIS — J9690 Respiratory failure, unspecified, unspecified whether with hypoxia or hypercapnia: Secondary | ICD-10-CM | POA: Diagnosis not present

## 2018-06-24 DIAGNOSIS — K651 Peritoneal abscess: Secondary | ICD-10-CM | POA: Diagnosis not present

## 2018-06-24 DIAGNOSIS — I509 Heart failure, unspecified: Secondary | ICD-10-CM | POA: Diagnosis not present

## 2018-06-27 ENCOUNTER — Other Ambulatory Visit: Payer: Self-pay | Admitting: *Deleted

## 2018-06-27 DIAGNOSIS — T8189XA Other complications of procedures, not elsewhere classified, initial encounter: Secondary | ICD-10-CM | POA: Diagnosis not present

## 2018-06-27 NOTE — Patient Outreach (Signed)
[facility] ([facility]) Care Management  06/27/2018  Keigen D Spragg 10/17/1931 [id number]   CSW met with pt and his wife at [facility] today. Pt's wife, Judith, states they have been married "it will be 60 years in May".  Pt was being assisted with his drains and so his wife provided most of the info received.  Pt is recovering at SNF after having colon surgery for Stage III Colon Cancer. Per wife, he became infected in intestines and has several drains the SNF staff are caring for. They do anticipate short SNF for drain care and therapies; however, pt's wife is concerned about the infection and the drains; and her ability to manage at home. Pt is to see the Surgeon on 07/10/2018 and they don't anticipate a dc until after that visit.  CSW provided emotional support to the wife and discussed [facility] program and services/support offered.  CSW also spoke with SNFstaff and will plan a follow up call/visit with pt and wife.     Janet Caldwell, MSW, LCSW Clinical Social Worker  [facility] [phone]  

## 2018-06-29 ENCOUNTER — Ambulatory Visit: Payer: Self-pay | Admitting: *Deleted

## 2018-06-30 ENCOUNTER — Other Ambulatory Visit: Payer: Self-pay | Admitting: *Deleted

## 2018-06-30 NOTE — Patient Outreach (Signed)
[facility]) Care Management  06/30/2018  Vincent Mcclure Apr 19, 1932 [id number]   CSW spoke with pt's wife who states they are having a meeting at SNF to discuss is progress and plans. She is concerned they may release him sooner than she is ready or comfortable with. CSW discussed her options including insurance SNF dc appeal and private pay. Pt will have a follow up CT scan next week and see the surgeon on 07/10/2018. His drains remain and he is getting PT/OT at SNF.   CSW will plan SNF inquiry for updates next week.   Eduard Clos, MSW, [place] Worker  [place] [phone]

## 2018-07-03 ENCOUNTER — Other Ambulatory Visit: Payer: Self-pay | Admitting: *Deleted

## 2018-07-03 ENCOUNTER — Ambulatory Visit: Payer: Self-pay | Admitting: *Deleted

## 2018-07-03 DIAGNOSIS — T8189XD Other complications of procedures, not elsewhere classified, subsequent encounter: Secondary | ICD-10-CM | POA: Diagnosis not present

## 2018-07-03 NOTE — Patient Outreach (Signed)
[place] [facility]) Care Management  07/03/2018  Vincent Mcclure 08/21/1932 [id number]   CSW attempted to reach pt/wife for updates on his SNF progress without success. CSW left voice message and will try again in 1-2 days if no return call is received.   Eduard Clos, MSW, [place] Worker  [place] [phone]

## 2018-07-04 ENCOUNTER — Observation Stay (HOSPITAL_COMMUNITY)
Admission: EM | Admit: 2018-07-04 | Discharge: 2018-07-06 | Disposition: A | Discharge status: Home / Self Care | Payer: Medicare HMO | Attending: Family Medicine | Admitting: Family Medicine

## 2018-07-04 ENCOUNTER — Other Ambulatory Visit: Payer: Self-pay | Admitting: General Surgery

## 2018-07-04 ENCOUNTER — Emergency Department (HOSPITAL_COMMUNITY): Payer: Medicare HMO

## 2018-07-04 ENCOUNTER — Ambulatory Visit
Admission: RE | Admit: 2018-07-04 | Discharge: 2018-07-04 | Disposition: A | Discharge status: Home / Self Care | Payer: Medicare HMO | Source: Ambulatory Visit | Attending: General Surgery | Admitting: General Surgery

## 2018-07-04 ENCOUNTER — Telehealth: Payer: Self-pay

## 2018-07-04 ENCOUNTER — Telehealth: Payer: Self-pay | Admitting: Nurse Practitioner

## 2018-07-04 ENCOUNTER — Other Ambulatory Visit: Payer: Self-pay | Admitting: *Deleted

## 2018-07-04 ENCOUNTER — Ambulatory Visit
Admission: RE | Admit: 2018-07-04 | Discharge: 2018-07-04 | Disposition: A | Discharge status: Home / Self Care | Payer: Medicare HMO | Source: Ambulatory Visit | Attending: Radiology | Admitting: Radiology

## 2018-07-04 ENCOUNTER — Encounter: Payer: Self-pay | Admitting: Radiology

## 2018-07-04 DIAGNOSIS — I110 Hypertensive heart disease with heart failure: Secondary | ICD-10-CM | POA: Insufficient documentation

## 2018-07-04 DIAGNOSIS — Z951 Presence of aortocoronary bypass graft: Secondary | ICD-10-CM | POA: Insufficient documentation

## 2018-07-04 DIAGNOSIS — I252 Old myocardial infarction: Secondary | ICD-10-CM | POA: Insufficient documentation

## 2018-07-04 DIAGNOSIS — R509 Fever, unspecified: Secondary | ICD-10-CM | POA: Diagnosis not present

## 2018-07-04 DIAGNOSIS — D3502 Benign neoplasm of left adrenal gland: Secondary | ICD-10-CM | POA: Insufficient documentation

## 2018-07-04 DIAGNOSIS — T8143XA Infection following a procedure, organ and space surgical site, initial encounter: Secondary | ICD-10-CM

## 2018-07-04 DIAGNOSIS — G8929 Other chronic pain: Secondary | ICD-10-CM | POA: Insufficient documentation

## 2018-07-04 DIAGNOSIS — I739 Peripheral vascular disease, unspecified: Secondary | ICD-10-CM | POA: Diagnosis present

## 2018-07-04 DIAGNOSIS — I2510 Atherosclerotic heart disease of native coronary artery without angina pectoris: Secondary | ICD-10-CM | POA: Insufficient documentation

## 2018-07-04 DIAGNOSIS — I25119 Atherosclerotic heart disease of native coronary artery with unspecified angina pectoris: Secondary | ICD-10-CM | POA: Diagnosis present

## 2018-07-04 DIAGNOSIS — Z86010 Personal history of colonic polyps: Secondary | ICD-10-CM | POA: Insufficient documentation

## 2018-07-04 DIAGNOSIS — G609 Hereditary and idiopathic neuropathy, unspecified: Secondary | ICD-10-CM | POA: Insufficient documentation

## 2018-07-04 DIAGNOSIS — E876 Hypokalemia: Secondary | ICD-10-CM | POA: Diagnosis present

## 2018-07-04 DIAGNOSIS — L03311 Cellulitis of abdominal wall: Principal | ICD-10-CM | POA: Diagnosis present

## 2018-07-04 DIAGNOSIS — C189 Malignant neoplasm of colon, unspecified: Secondary | ICD-10-CM | POA: Diagnosis not present

## 2018-07-04 DIAGNOSIS — I5033 Acute on chronic diastolic (congestive) heart failure: Secondary | ICD-10-CM | POA: Diagnosis not present

## 2018-07-04 DIAGNOSIS — E785 Hyperlipidemia, unspecified: Secondary | ICD-10-CM | POA: Diagnosis not present

## 2018-07-04 DIAGNOSIS — I10 Essential (primary) hypertension: Secondary | ICD-10-CM | POA: Diagnosis present

## 2018-07-04 DIAGNOSIS — Z87891 Personal history of nicotine dependence: Secondary | ICD-10-CM | POA: Insufficient documentation

## 2018-07-04 DIAGNOSIS — N281 Cyst of kidney, acquired: Secondary | ICD-10-CM | POA: Insufficient documentation

## 2018-07-04 DIAGNOSIS — T8149XA Infection following a procedure, other surgical site, initial encounter: Secondary | ICD-10-CM | POA: Diagnosis not present

## 2018-07-04 DIAGNOSIS — M545 Low back pain: Secondary | ICD-10-CM | POA: Insufficient documentation

## 2018-07-04 DIAGNOSIS — K269 Duodenal ulcer, unspecified as acute or chronic, without hemorrhage or perforation: Secondary | ICD-10-CM | POA: Insufficient documentation

## 2018-07-04 DIAGNOSIS — Z8719 Personal history of other diseases of the digestive system: Secondary | ICD-10-CM | POA: Insufficient documentation

## 2018-07-04 DIAGNOSIS — T85898A Other specified complication of other internal prosthetic devices, implants and grafts, initial encounter: Secondary | ICD-10-CM | POA: Diagnosis not present

## 2018-07-04 DIAGNOSIS — J948 Other specified pleural conditions: Secondary | ICD-10-CM | POA: Insufficient documentation

## 2018-07-04 DIAGNOSIS — R7302 Impaired glucose tolerance (oral): Secondary | ICD-10-CM | POA: Insufficient documentation

## 2018-07-04 DIAGNOSIS — J449 Chronic obstructive pulmonary disease, unspecified: Secondary | ICD-10-CM | POA: Insufficient documentation

## 2018-07-04 DIAGNOSIS — D509 Iron deficiency anemia, unspecified: Secondary | ICD-10-CM | POA: Diagnosis not present

## 2018-07-04 DIAGNOSIS — N2889 Other specified disorders of kidney and ureter: Secondary | ICD-10-CM | POA: Insufficient documentation

## 2018-07-04 DIAGNOSIS — Z7951 Long term (current) use of inhaled steroids: Secondary | ICD-10-CM | POA: Insufficient documentation

## 2018-07-04 DIAGNOSIS — K651 Peritoneal abscess: Secondary | ICD-10-CM | POA: Insufficient documentation

## 2018-07-04 DIAGNOSIS — N400 Enlarged prostate without lower urinary tract symptoms: Secondary | ICD-10-CM | POA: Insufficient documentation

## 2018-07-04 DIAGNOSIS — Z833 Family history of diabetes mellitus: Secondary | ICD-10-CM | POA: Insufficient documentation

## 2018-07-04 DIAGNOSIS — C182 Malignant neoplasm of ascending colon: Secondary | ICD-10-CM | POA: Diagnosis present

## 2018-07-04 DIAGNOSIS — K589 Irritable bowel syndrome without diarrhea: Secondary | ICD-10-CM | POA: Insufficient documentation

## 2018-07-04 DIAGNOSIS — K750 Abscess of liver: Secondary | ICD-10-CM | POA: Insufficient documentation

## 2018-07-04 DIAGNOSIS — I700 Atherosclerosis of aorta: Secondary | ICD-10-CM | POA: Insufficient documentation

## 2018-07-04 DIAGNOSIS — Z9049 Acquired absence of other specified parts of digestive tract: Secondary | ICD-10-CM | POA: Insufficient documentation

## 2018-07-04 DIAGNOSIS — Z9849 Cataract extraction status, unspecified eye: Secondary | ICD-10-CM | POA: Insufficient documentation

## 2018-07-04 DIAGNOSIS — R1900 Intra-abdominal and pelvic swelling, mass and lump, unspecified site: Secondary | ICD-10-CM | POA: Diagnosis not present

## 2018-07-04 DIAGNOSIS — I493 Ventricular premature depolarization: Secondary | ICD-10-CM | POA: Insufficient documentation

## 2018-07-04 DIAGNOSIS — I480 Paroxysmal atrial fibrillation: Secondary | ICD-10-CM | POA: Diagnosis present

## 2018-07-04 DIAGNOSIS — F1011 Alcohol abuse, in remission: Secondary | ICD-10-CM | POA: Insufficient documentation

## 2018-07-04 DIAGNOSIS — I5032 Chronic diastolic (congestive) heart failure: Secondary | ICD-10-CM | POA: Diagnosis present

## 2018-07-04 DIAGNOSIS — R0602 Shortness of breath: Secondary | ICD-10-CM | POA: Diagnosis not present

## 2018-07-04 DIAGNOSIS — Z808 Family history of malignant neoplasm of other organs or systems: Secondary | ICD-10-CM | POA: Insufficient documentation

## 2018-07-04 DIAGNOSIS — Z8249 Family history of ischemic heart disease and other diseases of the circulatory system: Secondary | ICD-10-CM | POA: Insufficient documentation

## 2018-07-04 DIAGNOSIS — R188 Other ascites: Secondary | ICD-10-CM | POA: Diagnosis present

## 2018-07-04 DIAGNOSIS — Z79899 Other long term (current) drug therapy: Secondary | ICD-10-CM | POA: Insufficient documentation

## 2018-07-04 DIAGNOSIS — Z823 Family history of stroke: Secondary | ICD-10-CM | POA: Insufficient documentation

## 2018-07-04 DIAGNOSIS — Z7982 Long term (current) use of aspirin: Secondary | ICD-10-CM | POA: Insufficient documentation

## 2018-07-04 DIAGNOSIS — J90 Pleural effusion, not elsewhere classified: Secondary | ICD-10-CM | POA: Insufficient documentation

## 2018-07-04 DIAGNOSIS — D638 Anemia in other chronic diseases classified elsewhere: Secondary | ICD-10-CM | POA: Diagnosis not present

## 2018-07-04 DIAGNOSIS — K210 Gastro-esophageal reflux disease with esophagitis: Secondary | ICD-10-CM | POA: Insufficient documentation

## 2018-07-04 DIAGNOSIS — R0902 Hypoxemia: Secondary | ICD-10-CM | POA: Insufficient documentation

## 2018-07-04 DIAGNOSIS — K5730 Diverticulosis of large intestine without perforation or abscess without bleeding: Secondary | ICD-10-CM | POA: Insufficient documentation

## 2018-07-04 HISTORY — PX: IR RADIOLOGIST EVAL & MGMT: IMG5224

## 2018-07-04 LAB — CBC WITH DIFFERENTIAL/PLATELET
ABS IMMATURE GRANULOCYTES: 0 10*3/uL (ref 0.0–0.1)
Basophils Absolute: 0 10*3/uL (ref 0.0–0.1)
Basophils Relative: 0 %
Eosinophils Absolute: 0.1 10*3/uL (ref 0.0–0.7)
Eosinophils Relative: 1 %
HEMATOCRIT: 30.4 % — AB (ref 39.0–52.0)
HEMOGLOBIN: 9.3 g/dL — AB (ref 13.0–17.0)
IMMATURE GRANULOCYTES: 1 %
LYMPHS ABS: 1.7 10*3/uL (ref 0.7–4.0)
LYMPHS PCT: 21 %
MCH: 26.8 pg (ref 26.0–34.0)
MCHC: 30.6 g/dL (ref 30.0–36.0)
MCV: 87.6 fL (ref 78.0–100.0)
MONOS PCT: 7 %
Monocytes Absolute: 0.6 10*3/uL (ref 0.1–1.0)
NEUTROS ABS: 5.7 10*3/uL (ref 1.7–7.7)
NEUTROS PCT: 70 %
Platelets: 269 10*3/uL (ref 150–400)
RBC: 3.47 MIL/uL — ABNORMAL LOW (ref 4.22–5.81)
RDW: 16.2 % — ABNORMAL HIGH (ref 11.5–15.5)
WBC: 8.1 10*3/uL (ref 4.0–10.5)

## 2018-07-04 LAB — URINALYSIS, ROUTINE W REFLEX MICROSCOPIC
Bilirubin Urine: NEGATIVE
GLUCOSE, UA: NEGATIVE mg/dL
Hgb urine dipstick: NEGATIVE
Ketones, ur: NEGATIVE mg/dL
LEUKOCYTES UA: NEGATIVE
Nitrite: NEGATIVE
PH: 6 (ref 5.0–8.0)
Protein, ur: NEGATIVE mg/dL
Specific Gravity, Urine: >1.046 — ABNORMAL HIGH (ref 1.005–1.030)

## 2018-07-04 LAB — COMPREHENSIVE METABOLIC PANEL
ALT: 13 U/L (ref 0–44)
AST: 19 U/L (ref 15–41)
Albumin: 2.3 g/dL — ABNORMAL LOW (ref 3.5–5.0)
Alkaline Phosphatase: 85 U/L (ref 38–126)
Anion gap: 8 (ref 5–15)
BUN: 9 mg/dL (ref 8–23)
CHLORIDE: 103 mmol/L (ref 98–111)
CO2: 25 mmol/L (ref 22–32)
CREATININE: 1.01 mg/dL (ref 0.61–1.24)
Calcium: 7.9 mg/dL — ABNORMAL LOW (ref 8.9–10.3)
GFR calc non Af Amer: >60 mL/min (ref >60)
Glucose, Bld: 170 mg/dL — ABNORMAL HIGH (ref 70–99)
Potassium: 3.4 mmol/L — ABNORMAL LOW (ref 3.5–5.1)
SODIUM: 136 mmol/L (ref 135–145)
Total Bilirubin: 0.4 mg/dL (ref 0.3–1.2)
Total Protein: 5.1 g/dL — ABNORMAL LOW (ref 6.5–8.1)

## 2018-07-04 LAB — TROPONIN I: Troponin I: <0.03 ng/mL (ref <0.03)

## 2018-07-04 LAB — I-STAT CG4 LACTIC ACID, ED: Lactic Acid, Venous: 2.03 mmol/L (ref 0.5–1.9)

## 2018-07-04 LAB — PROTIME-INR
INR: 1.22
Prothrombin Time: 15.3 seconds — ABNORMAL HIGH (ref 11.4–15.2)

## 2018-07-04 MED ORDER — SODIUM CHLORIDE 0.9 % IV BOLUS (SEPSIS)
1000.0000 mL | Freq: Once | INTRAVENOUS | Status: AC
  Administered 2018-07-04: 1000 mL via INTRAVENOUS

## 2018-07-04 MED ORDER — ONDANSETRON HCL 4 MG/2ML IJ SOLN
4.0000 mg | Freq: Once | INTRAMUSCULAR | Status: AC
  Administered 2018-07-04: 4 mg via INTRAVENOUS
  Filled 2018-07-04: qty 2

## 2018-07-04 MED ORDER — SODIUM CHLORIDE 0.9 % IV SOLN
2.0000 g | INTRAVENOUS | Status: DC
  Administered 2018-07-04 – 2018-07-05 (×2): 2 g via INTRAVENOUS
  Filled 2018-07-04 (×3): qty 20

## 2018-07-04 MED ORDER — ACETAMINOPHEN 500 MG PO TABS
1000.0000 mg | ORAL_TABLET | Freq: Once | ORAL | Status: AC
  Administered 2018-07-04: 1000 mg via ORAL
  Filled 2018-07-04: qty 2

## 2018-07-04 MED ORDER — IOHEXOL 300 MG/ML  SOLN
100.0000 mL | Freq: Once | INTRAMUSCULAR | Status: AC | PRN
  Administered 2018-07-04: 100 mL via INTRAVENOUS

## 2018-07-04 MED ORDER — IOPAMIDOL (ISOVUE-300) INJECTION 61%
100.0000 mL | Freq: Once | INTRAVENOUS | Status: AC | PRN
  Administered 2018-07-04: 100 mL via INTRAVENOUS

## 2018-07-04 NOTE — Telephone Encounter (Signed)
Called and left VM on Mobile requesting a call back to re-schedule initial consult with Regan Rakers Burton/Dr. [name]. Awaiting call back.

## 2018-07-04 NOTE — ED Triage Notes (Addendum)
Patient arrived with EMS from [place] reports worsening left lateral abdominal reddnesss/swelling onset this week with fever and chest congestion/productive cough . CBG= 198 /Temp= 101 by EMS . History of colectomy 05/19/18 with abdominal drains .

## 2018-07-04 NOTE — ED Provider Notes (Signed)
[facility] EMERGENCY DEPARTMENT Provider Note   CSN: [id number] Arrival date & time: 07/04/18  2124     History   Chief Complaint Chief Complaint  Patient presents with  . Cellulitis    Fever    HPI Vincent Mcclure is a 84 y.o. male.  Pt presents to the ED today with a fever.  The pt has a hx of colon cancer with recent right colectomy on 8/9.  He developed an anastomotic leak and developed an intra-abdominal abscess.  He had percutaneous drains placed by IR on 9/3.  Culture grew out bacteroides fragilis.  Also while in the hospital, he developed resp failure with chf and copd.  He was treated with abx for hcap.  He was also treated for CHF.  He has afib and is not on anticoagulation due to recent gi bleed.  The was d/c on 9/12 to a SNF.  The pt noticed some redness and swelling to his left lower abdomen today.  He has also had a cough.  Temp 101 at SNF.      Past Medical History:  Diagnosis Date  . Alcohol abuse   . Anemia   . Chronic low back pain 10/14/2011  . Colon polyps    hyperplastic (2004, 2010) and adenomatous (1990).    . COLONIC POLYPS, HX OF 03/05/2008  . Esophageal stricture    hx of  . Gastric ulcer   . GERD 03/05/2008  . GERD (gastroesophageal reflux disease) 1994   associated peptic strictures  . HYPERLIPIDEMIA 03/05/2008  . HYPERTENSION 03/05/2008  . Hypertension   . IBS 03/05/2008  . Impaired glucose tolerance 10/12/2011  . Iron deficiency anemia   . LEG CRAMPS 09/02/2010  . PAD (peripheral artery disease) ([place]) 10/14/2011  . PAD (peripheral artery disease) ([place]) 2013  . PERIPHERAL EDEMA 09/02/2010  . PERIPHERAL NEUROPATHY 03/05/2008  . Prostatitis    hx of  . VARICOSE VEINS, LOWER EXTREMITIES 06/09/2009    Patient Active Problem List   Diagnosis Date Noted  . Peripheral edema   . Post-operative state   . Rupture of operation wound   . General weakness   . SOB (shortness of breath)   . CHF (congestive heart failure) ([place])  06/06/2018  . Subacute confusional state 06/05/2018  . Hypoalbuminemia 06/03/2018  . Congestive heart failure (CHF) ([place]) 06/02/2018  . Hypokalemia 06/02/2018  . Atrial fibrillation with RVR ([place]) 06/02/2018  . Cancer of ascending colon [id number] s/p right colectomy 05/19/2018 05/24/2018  . CAD (coronary artery disease), native coronary artery 03/14/2017  . Paroxysmal atrial fibrillation ([name])   . 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 s/p NSTEMI (non-ST elevated myocardial infarction); with subsequent urgent CABG  5/18 ([place]) 02/12/2017  . Chest pain 02/11/2017  . Overweight (BMI 25.0-29.9) 09/29/2016  . Alcohol use/ h/o heavy abuse 08/11/2016  . Left shoulder pain 01/24/2015  . h/o Anemia due to blood loss, acute 11/14/2014  . Eye pain   . Acute esophagitis   . Acute gastric ulcer   . Duodenal ulcer disease   . Upper GI bleed 11/13/2014  . Lumbar disc disease 01/30/2014  . Hearing loss 03/23/2013  . Screening for other and unspecified cardiovascular conditions 03/23/2013  . PAD (peripheral artery disease) ([place]) 10/14/2011  . Chronic low back pain 10/14/2011  . Glucose intolerance (impaired glucose tolerance) 10/12/2011  . Preventative health care 10/12/2011  . LEG CRAMPS 09/02/2010  . PERIPHERAL EDEMA 09/02/2010  . VARICOSE VEINS, LOWER EXTREMITIES 06/09/2009  .  HLD (hyperlipidemia) 03/05/2008  . Anemia, iron deficiency 03/05/2008  . Hereditary and idiopathic peripheral neuropathy 03/05/2008  . HTN (hypertension) 03/05/2008  . GERD 03/05/2008  . IBS 03/05/2008  . Blood in stool 03/05/2008  . COLONIC POLYPS, HX OF 03/05/2008    Past Surgical History:  Procedure Laterality Date  . CATARACT EXTRACTION  bilat  . COLECTOMY    . CORONARY ARTERY BYPASS GRAFT N/A 02/15/2017   Procedure: CORONARY ARTERY BYPASS GRAFTING times three  with left internal mammary harvest and endoscopic harvest of Right SVG. Grafts of LIMA to  LAD, SVG to Distal Circ, and to First Diag.;  Surgeon: Grace Isaac,  MD;  Location: [facility];  Service: Open Heart Surgery;  Laterality: N/A;  . ESOPHAGOGASTRODUODENOSCOPY N/A 11/14/2014   Procedure: ESOPHAGOGASTRODUODENOSCOPY (EGD);  Surgeon: Jerene Bears, MD;  Location[facility] END[facility];  Service: Endoscopy;  Laterality: N/A;  . IR RADIOLOGIST EVAL & MGMT  07/04/2018  . LAPAROTOMY N/A 05/19/2018   Procedure: EXPLORATORY LAPAROTOMY RIGHT COLECTOMY;  Surgeon: Fanny Skates, MD;  Location: [facility];  Service: General;  Laterality: N/A;  . LEFT HEART CATH AND CORONARY ANGIOGRAPHY N/A 02/14/2017   Procedure: Left Heart Cath and Coronary Angiography;  Surgeon: Belva Crome, MD;  Location: [facility];  Service: Cardiovascular;  Laterality: N/A;  . LUMBAR SPINE SURGERY  11/2008   Dr Joya Salm  . TEE WITHOUT CARDIOVERSION N/A 02/15/2017   Procedure: TRANSESOPHAGEAL ECHOCARDIOGRAM (TEE);  Surgeon: Grace Isaac, MD;  Location: [facility];  Service: Open Heart Surgery;  Laterality: N/A;        Home Medications    Prior to Admission medications   Medication Sig Start Date End Date Taking? Authorizing Provider  acetaminophen (TYLENOL) 500 MG tablet Take 1 tablet (500 mg total) by mouth every 6 (six) hours as needed for mild pain. 05/30/18  Yes Rayburn, Floyce Stakes, PA-C  acetaminophen (TYLENOL) 650 MG CR tablet Take 650 mg by mouth every 6 (six) hours as needed for fever.   Yes [provider]  albuterol (PROVENTIL) (2.5 MG/3ML) 0.083% nebulizer solution Take 2.5 mg by nebulization 2 (two) times daily.   Yes [provider]  aspirin EC 81 MG EC tablet Take 1 tablet (81 mg total) by mouth daily. 02/25/17  Yes Conte, Tessa N, PA-C  ferrous sulfate 325 (65 FE) MG EC tablet Take 1 tablet (325 mg total) by mouth 3 (three) times daily with meals. Patient taking differently: Take 325 mg by mouth daily with breakfast.  01/17/18  Yes Opalski, Neoma Laming, DO  furosemide (LASIX) 40 MG tablet Take 1 tablet (40 mg total) by mouth daily. 08/10/17 08/10/18 Yes Dorothy Spark, MD    gabapentin (NEURONTIN) 100 MG capsule Take 1 capsule (100 mg total) by mouth 2 (two) times daily. 05/30/18  Yes Rayburn, Floyce Stakes, PA-C  guaiFENesin (MUCINEX) 600 MG 12 hr tablet Take 600 mg by mouth 2 (two) times daily.   Yes [provider]  metoprolol succinate (TOPROL XL) 50 MG 24 hr tablet Take 1 tablet (50 mg total) by mouth daily. Take with or immediately following a meal. 05/24/17  Yes Imogene Burn, PA-C  Multiple Vitamin (MULTIVITAMIN WITH MINERALS) TABS tablet Take 1 tablet by mouth daily.   Yes [provider]  pantoprazole (PROTONIX) 40 MG tablet Take 1 tablet (40 mg total) by mouth daily. 06/23/18  Yes Shelly Coss, MD  pneumococcal 13-valent conjugate vaccine (PREVNAR 13) SUSP injection Inject 0.5 mLs into the muscle once.  Yes [provider]  pravastatin (PRAVACHOL) 40 MG tablet I po q hs Patient taking differently: Take 40 mg by mouth at bedtime.  01/17/18  Yes Opalski, Neoma Laming, DO  traMADol (ULTRAM) 50 MG tablet Take 1 tablet (50 mg total) by mouth every 12 (twelve) hours as needed for moderate pain. 06/22/18  Yes Adhikari, Tamsen Meek, MD  umeclidinium-vilanterol (ANORO ELLIPTA) 62.5-25 MCG/INH AEPB Inhale 1 puff into the lungs daily.   Yes [provider]  vitamin C (ASCORBIC ACID) 500 MG tablet Take 500 mg by mouth 2 (two) times daily.   Yes [provider]  dextromethorphan-guaiFENesin (MUCINEX DM) 30-600 MG 12hr tablet Take 2 tablets by mouth 2 (two) times daily. Patient not taking: Reported on 07/04/2018 06/22/18   Shelly Coss, MD  polyethylene glycol [facility]) packet Take 17 g by mouth daily. Patient not taking: Reported on 07/04/2018 05/30/18   Rayburn, Floyce Stakes, PA-C    Family History Family History  Problem Relation Age of Onset  . Cancer Mother        Brain Cancer  . Diabetes Father   . Heart disease Father        CAD  . Hypertension Father   . Stroke Father   . Diabetes Sister   . Stomach cancer Neg Hx   . Pancreatic  cancer Neg Hx   . Colon cancer Neg Hx   . Esophageal cancer Neg Hx     Social History Social History   Tobacco Use  . Smoking status: Former Smoker    Packs/day: 1.25    Years: 50.00    Pack years: 62.50    Types: Cigarettes    Last attempt to quit: 11/11/2008    Years since quitting: 9.6  . Smokeless tobacco: Former Systems developer    Types: Chew  Substance Use Topics  . Alcohol use: Yes    Alcohol/week: 6.0 standard drinks    Types: 2 Glasses of wine, 4 Cans of beer per week  . Drug use: No     Allergies   No known allergies   Review of Systems Review of Systems  Constitutional: Positive for fever.  Respiratory: Positive for cough.   Gastrointestinal: Positive for abdominal pain.  Skin: Positive for rash.  All other systems reviewed and are negative.    Physical Exam Updated Vital Signs BP 117/65   Pulse 93   Temp 100 F (37.8 C)   Resp (!) 23   SpO2 97%   Physical Exam  Constitutional: He is oriented to person, place, and time. He appears well-developed and well-nourished.  HENT:  Head: Normocephalic and atraumatic.  Right Ear: External ear normal.  Left Ear: External ear normal.  Nose: Nose normal.  Mouth/Throat: Oropharynx is clear and moist.  Eyes: Pupils are equal, round, and reactive to light. Conjunctivae and EOM are normal.  Neck: Normal range of motion. Neck supple.  Cardiovascular: Regular rhythm, normal heart sounds and intact distal pulses. Tachycardia present.  Pulmonary/Chest: He has rales.  Abdominal: Soft. Bowel sounds are normal. There is tenderness.  Redness and swelling to left lower abdomen. Surgical scars and drain without cellulitis.  Musculoskeletal: Normal range of motion.  Neurological: He is alert and oriented to person, place, and time.  Skin: Skin is warm and dry. Capillary refill takes less than 2 seconds.  Psychiatric: He has a normal mood and affect. His behavior is normal. Judgment and thought content normal.  Nursing note and  vitals reviewed.    ED Treatments / Results  Labs (all labs ordered are listed, but only abnormal results are displayed) Labs Reviewed  COMPREHENSIVE METABOLIC PANEL - Abnormal; Notable for the following components:      Result Value   Potassium 3.4 (*)    Glucose, Bld 170 (*)    Calcium 7.9 (*)    Total Protein 5.1 (*)    Albumin 2.3 (*)    All other components within normal limits  CBC WITH DIFFERENTIAL/PLATELET - Abnormal; Notable for the following components:   RBC 3.47 (*)    Hemoglobin 9.3 (*)    HCT 30.4 (*)    RDW 16.2 (*)    All other components within normal limits  URINALYSIS, ROUTINE W REFLEX MICROSCOPIC - Abnormal; Notable for the following components:   Specific Gravity, Urine >1.046 (*)    All other components within normal limits  PROTIME-INR - Abnormal; Notable for the following components:   Prothrombin Time 15.3 (*)    All other components within normal limits  I-STAT CG4 LACTIC ACID, ED - Abnormal; Notable for the following components:   Lactic Acid, Venous 2.03 (*)    All other components within normal limits  CULTURE, BLOOD (ROUTINE X 2)  CULTURE, BLOOD (ROUTINE X 2)  URINE CULTURE  TROPONIN I  I-STAT CG4 LACTIC ACID, ED    EKG EKG Interpretation  Date/Time:  Tuesday July 04 2018 22:31:39 EDT Ventricular Rate:  97 PR Interval:    QRS Duration: 96 QT Interval:  349 QTC Calculation: 444 R Axis:   53 Text Interpretation:  Sinus rhythm Multiple premature complexes, vent & supraven Low voltage, precordial leads No significant change since last tracing Confirmed by Isla Pence [id number]) on 07/04/2018 10:38:44 PM   Radiology Dg Chest 2 View  Result Date: 07/04/2018 CLINICAL DATA:  84 year old male with shortness of breath. EXAM: CHEST - 2 VIEW COMPARISON:  Chest radiograph dated 06/18/2018 FINDINGS: Small bilateral pleural effusions and associated atelectatic changes of the lung bases. Superimposed pneumonia is not excluded. Clinical  correlation is recommended. No pneumothorax. Mild cardiomegaly. There is atherosclerotic calcification of the aortic arch. Median sternotomy wires and postsurgical changes of CABG. No acute osseous pathology. IMPRESSION: Small bilateral pleural effusions and bibasilar atelectasis versus infiltrate. Electronically Signed   By: Anner Crete M.D.   On: 07/04/2018 22:52   Ct Abdomen Pelvis W Contrast  Result Date: 07/05/2018 CLINICAL DATA:  Right hemicolectomy 05/19/2018 for obstructing colon cancer, complicated by abscess requiring percutaneous drainage catheter placement x2. Left abdominal erythema and swelling. Fever. Cough. EXAM: CT ABDOMEN AND PELVIS WITH CONTRAST TECHNIQUE: Multidetector CT imaging of the abdomen and pelvis was performed using the standard protocol following bolus administration of intravenous contrast. CONTRAST:  123mL OMNIPAQUE IOHEXOL 300 MG/ML  SOLN COMPARISON:  07/04/2018 CT abdomen/pelvis. FINDINGS: Lower chest: Small to moderate dependent right pleural effusion, stable. Dependent bibasilar atelectasis. Lower sternotomy wires are intact. Coronary atherosclerosis status post CABG. Hepatobiliary: Normal liver size. No liver mass. Collapsed gallbladder with no radiopaque cholelithiasis. No biliary ductal dilatation. Pancreas: Normal, with no mass or duct dilation. Spleen: Normal size spleen. Scattered punctate granulomatous splenic calcifications. No splenic mass. Adrenals/Urinary Tract: Left adrenal 2.3 cm nodule is stable since 2007 CT, compatible with a benign adenoma. No right adrenal nodule. No hydronephrosis. Small calyceal diverticulum in the upper right kidney. Simple 1.5 cm interpolar left renal cyst. Normal bladder containing excreted contrast. Stomach/Bowel: Normal non-distended stomach. Status post right hemicolectomy with ileocolic anastomosis in the right abdomen. No significant small bowel dilatation or definite small bowel wall  thickening. Mild sigmoid diverticulosis.  Stable mild wall thickening in the sigmoid colon adjacent to the right lower quadrant abscess. No new sites of wall thickening in the remnant large-bowel. Vascular/Lymphatic: Atherosclerotic nonaneurysmal abdominal aorta. Patent portal, splenic, hepatic and renal veins. No pathologically enlarged lymph nodes in the abdomen or pelvis. Reproductive: Normal size prostate. Other: Anterior right upper quadrant percutaneous drain terminates in the anterior right perihepatic space. Minimal contrast is noted surrounding the pigtail tip in this location from the tube injection procedure performed earlier today, with no measurable collection in this location. Anterior right lower quadrant percutaneous drainage catheter terminates within right lower quadrant thick-walled collection measuring 5.3 x 4.6 cm, which is filled with injected contrast and minimal gas, unchanged in size from earlier today. No new focal fluid collections. Musculoskeletal: No aggressive appearing focal osseous lesions. Marked thoracolumbar spondylosis. IMPRESSION: 1. No change compared to the CT scan performed 9 hours earlier. 2. Anterior right perihepatic space collection is decompressed by indwelling percutaneous drain, with no residual measurable collection in this location. 3. Thick walled right lower quadrant collection with indwelling percutaneous drain is stable. No new focal fluid collections. 4. Stable postsurgical changes from right hemicolectomy with no evidence of bowel obstruction. Stable reactive wall thickening in the sigmoid colon adjacent to the right lower quadrant collection. 5. Stable small to moderate dependent right pleural effusion. 6. Stable left adrenal adenoma. 7.  Aortic Atherosclerosis (ICD10-I70.0). Electronically Signed   By: Ilona Sorrel M.D.   On: 07/05/2018 00:22   Ct Abdomen Pelvis W Contrast  Result Date: 07/04/2018 CLINICAL DATA:  Status post percutaneous catheter drainage right upper abdominal and pelvic abscesses  after right hemicolectomy. EXAM: CT ABDOMEN AND PELVIS WITH CONTRAST TECHNIQUE: Multidetector CT imaging of the abdomen and pelvis was performed using the standard protocol following bolus administration of intravenous contrast. CONTRAST:  155mL ISOVUE-300 IOPAMIDOL (ISOVUE-300) INJECTION 61% COMPARISON:  06/20/2018 FINDINGS: Lower chest: Slight increase in volume of right basilar pleural fluid. Hepatobiliary: No focal liver abnormality is seen. No gallstones, gallbladder wall thickening, or biliary dilatation. Pancreas: Unremarkable. No pancreatic ductal dilatation or surrounding inflammatory changes. Spleen: Normal in size without focal abnormality. Adrenals/Urinary Tract: Adrenal glands are unremarkable. Kidneys are normal, without renal calculi, focal lesion, or hydronephrosis. Bladder is unremarkable. Stomach/Bowel: No evidence of bowel obstruction or inflammation. No free intraperitoneal air. Vascular/Lymphatic: No significant vascular findings are present. No enlarged abdominal or pelvic lymph nodes. Reproductive: Prostate is unremarkable. Other: Further decompression of right anterior perihepatic abscess with resolution extraluminal air. There remains some fluid around the right pelvic drain measuring roughly 4.6 x 5.4 cm (previously 4.0 x 6.1 cm). No new fluid collections identified. Musculoskeletal: No acute or significant osseous findings. IMPRESSION: 1. Further decompression of right perihepatic abscess with resolution of extraluminal air. 2. Relatively stable volume of fluid surrounding the right pelvic drainage catheter. Electronically Signed   By: Aletta Edouard M.D.   On: 07/04/2018 16:44   Dg Sinus/fist Tube Chk-non Gi  Result Date: 07/04/2018 INDICATION: Status post percutaneous catheter drainage of right lower quadrant pelvic and right upper quadrant perihepatic abscess collections after prior right hemicolectomy. EXAM: 1. INJECTION OF RIGHT PELVIC PERCUTANEOUS DRAINAGE CATHETER UNDER  FLUOROSCOPY 2. INJECTION OF RIGHT UPPER QUADRANT PERCUTANEOUS DRAINAGE CATHETER UNDER FLUOROSCOPY MEDICATIONS: None ANESTHESIA/SEDATION: None CONTRAST:  Total of 60 mL Omnipaque 300 FLUOROSCOPY TIME:  2 minutes and 6 seconds.  128.6 mGy. COMPLICATIONS: None immediate. PROCEDURE: Contrast was injected through the right pelvic drainage catheter followed by the right upper abdominal drainage  catheter. Fluoroscopic spot images and cine loop sequences were saved. The right pelvic drainage catheter was connected to new suction bulb drainage. The right upper abdominal drainage catheter was connected to gravity bag drainage. FINDINGS: Injection of the right pelvic drainage catheter demonstrates irregular shaped abscess cavity. No demonstrated bowl fistula to adjacent bowel. Injection of the right upper abdominal perihepatic drainage catheter demonstrates irregular shaped abscess cavity without evidence of visible fistula to bowel. IMPRESSION: Fluoroscopic contrast injection of both the right pelvic and right upper abdominal drainage catheters demonstrate irregular abscess cavities without evidence of demonstrated fistula to bowel. Electronically Signed   By: Aletta Edouard M.D.   On: 07/04/2018 16:49   Ir Radiologist Eval & Mgmt  Result Date: 07/04/2018 Please refer to notes tab for details about interventional procedure. (Op Note)   Procedures Procedures (including critical care time)  Medications Ordered in ED Medications  cefTRIAXone (ROCEPHIN) 2 g in sodium chloride 0.9 % 100 mL IVPB (0 g Intravenous Stopped 07/04/18 2236)  clindamycin (CLEOCIN) IVPB 600 mg (has no administration in time range)  sodium chloride 0.9 % bolus 1,000 mL (0 mLs Intravenous Stopped 07/04/18 2308)    And  sodium chloride 0.9 % bolus 1,000 mL (0 mLs Intravenous Stopped 07/04/18 2308)    And  sodium chloride 0.9 % bolus 1,000 mL (1,000 mLs Intravenous New Bag/Given 07/04/18 2255)  acetaminophen (TYLENOL) tablet 1,000 mg (1,000 mg  Oral Given 07/04/18 2317)  iohexol (OMNIPAQUE) 300 MG/ML solution 100 mL (100 mLs Intravenous Contrast Given 07/04/18 2332)  ondansetron (ZOFRAN) injection 4 mg (4 mg Intravenous Given 07/04/18 2356)     Initial Impression / Assessment and Plan / ED Course  I have reviewed the triage vital signs and the nursing notes.  Pertinent labs & imaging results that were available during my care of the patient were reviewed by me and considered in my medical decision making (see chart for details).    HR and BP have improved with IVFs.  His cellulitis is treated with rocephin and clinda.  The pt does not have a new intraabdominal process going on.  The pt d/w Dr. Myna Hidalgo for admission.  Final Clinical Impressions(s) / ED Diagnoses   Final diagnoses:  Cellulitis of abdominal wall  Fever, unspecified fever cause    ED Discharge Orders    None       Isla Pence, MD 07/05/18 [phone]

## 2018-07-04 NOTE — Telephone Encounter (Signed)
Scheduled appt per 9/24 sch message - left message for patient with appt date and time.

## 2018-07-04 NOTE — Progress Notes (Signed)
Chief Complaint: Patient was seen in clinic today for follow-up CT scan and injection of RUQ and RLQ abscess drains.  Supervising Physician: Aletta Edouard  History of Present Illness: Vincent Mcclure is a 84 y.o. male with past medical history significant for ETOH abuse, anemia, GERD, HLD, HTN, a.fib and most recently colon cancer with right colectomy on 05/19/18. Patient was d/ced from hospital on 8/20 following colectomy, he presented to his outpatient follow up appointment with surgery on 8/23 and was found to be very weak and congested - he was sent to [facility] ED for evaluation. CXR in ED showed pneumoperitoneum which was thought to be related to recent surgery. He was admitted from the ED for treatment of acute respiratory failure due to CHF exacerbation. He subsequently developed HCAP with sepsis and IV antibiotics were started on 8/30.   CT abdomen and pelvis performed on 9/2 showed evidence of bowel perforation/anastomic breakdown with large volume intraperitoneal free air fluid in the right upper quadrant as well as a large loculated fluid collection in the right anterior pelvis extending along the left pericolic gutter consistent with peritoneal abscess.IR was consulted for aspiration and drainage of peritoneal abscess and RLQ drain was placed on 9/3 by Dr. Earleen Newport.  Patient continued to complain of RLQ and epigastric pain in the following days and he continued to have worsening leukocytosis - repeat CT abdomen and pelvis was obtained on 9/6 which showed partial resolution of RLQ collection however there was enlargement of RUQ gas and fluid collection. IR was consulted again for RUQ drain placement which was performed on 9/7 by Dr. Vernard Gambles.   Output from RLQ decreased greatly and repeat CT abdomen/pelvis was performed on 9/10 which showed decompression of RUQ air and fluid collection with tiny rim of fluid remaining as well as small extraluminal gas bubbles suspicious for continued leak  from bowel. Additionally, there was further decrease in the size of residual abscess fluid collection in the pelvis however some fluid remains around the drainage cathter.  Patient was d/ced to SNF for temporary rehab on 06/22/18. He presents today for follow-up CT abdomen/pelvis as well as injection of both RUQ and RLQ drains. Patient's wife is very concerned regarding his impending d/c from SNF to home - she is concerned she will be unable to care for patient by herself and is very tearful during today's visit. She states he has had several episodes of "shaking chills" at the SNF since discharge from hospital, although she is only aware of a fever noted one time which was relieved with Tylenol. Patient is somewhat of a poor historian and wife serves as historian for majority of visit although patient does endorse ongoing abdominal pain mostly on the right side from epigastric to pelvic area that is worsened with exercise. Wife reports that drainage from RLQ drain slows down a lot at times but then will pick up again, RUQ drain continues with steady minimal output. She states SNF is flushing drains daily.   Past Medical History:  Diagnosis Date  . Alcohol abuse   . Anemia   . Chronic low back pain 10/14/2011  . Colon polyps    hyperplastic (2004, 2010) and adenomatous (1990).    . COLONIC POLYPS, HX OF 03/05/2008  . Esophageal stricture    hx of  . Gastric ulcer   . GERD 03/05/2008  . GERD (gastroesophageal reflux disease) 1994   associated peptic strictures  . HYPERLIPIDEMIA 03/05/2008  . HYPERTENSION 03/05/2008  . Hypertension   .  IBS 03/05/2008  . Impaired glucose tolerance 10/12/2011  . Iron deficiency anemia   . LEG CRAMPS 09/02/2010  . PAD (peripheral artery disease) ([place]) 10/14/2011  . PAD (peripheral artery disease) ([place]) 2013  . PERIPHERAL EDEMA 09/02/2010  . PERIPHERAL NEUROPATHY 03/05/2008  . Prostatitis    hx of  . VARICOSE VEINS, LOWER EXTREMITIES 06/09/2009    Past Surgical  History:  Procedure Laterality Date  . CATARACT EXTRACTION  bilat  . CORONARY ARTERY BYPASS GRAFT N/A 02/15/2017   Procedure: CORONARY ARTERY BYPASS GRAFTING times three  with left internal mammary harvest and endoscopic harvest of Right SVG. Grafts of LIMA to  LAD, SVG to Distal Circ, and to First Diag.;  Surgeon: Grace Isaac, MD;  Location: [facility];  Service: Open Heart Surgery;  Laterality: N/A;  . ESOPHAGOGASTRODUODENOSCOPY N/A 11/14/2014   Procedure: ESOPHAGOGASTRODUODENOSCOPY (EGD);  Surgeon: Jerene Bears, MD;  Location: [facility] ENDOSCOPY;  Service: Endoscopy;  Laterality: N/A;  . LAPAROTOMY N/A 05/19/2018   Procedure: EXPLORATORY LAPAROTOMY RIGHT COLECTOMY;  Surgeon: Fanny Skates, MD;  Location: [facility] ORS;  Service: General;  Laterality: N/A;  . LEFT HEART CATH AND CORONARY ANGIOGRAPHY N/A 02/14/2017   Procedure: Left Heart Cath and Coronary Angiography;  Surgeon: Belva Crome, MD;  Location: [facility] CV LAB;  Service: Cardiovascular;  Laterality: N/A;  . LUMBAR SPINE SURGERY  11/2008   Dr Joya Salm  . TEE WITHOUT CARDIOVERSION N/A 02/15/2017   Procedure: TRANSESOPHAGEAL ECHOCARDIOGRAM (TEE);  Surgeon: Grace Isaac, MD;  Location: [facility];  Service: Open Heart Surgery;  Laterality: N/A;    Allergies: No known allergies  Medications: Prior to Admission medications   Medication Sig Start Date End Date Taking? Authorizing Provider  acetaminophen (TYLENOL) 500 MG tablet Take 1 tablet (500 mg total) by mouth every 6 (six) hours as needed for mild pain. 05/30/18   Rayburn, Floyce Stakes, PA-C  aspirin EC 81 MG EC tablet Take 1 tablet (81 mg total) by mouth daily. 02/25/17   Elgie Collard, PA-C  dextromethorphan-guaiFENesin (MUCINEX DM) 30-600 MG 12hr tablet Take 2 tablets by mouth 2 (two) times daily. 06/22/18   Shelly Coss, MD  ferrous sulfate 325 (65 FE) MG EC tablet Take 1 tablet (325 mg total) by mouth 3 (three) times daily with meals. Patient not taking: Reported on 06/02/2018 01/17/18    [name], DO  furosemide (LASIX) 40 MG tablet Take 1 tablet (40 mg total) by mouth daily. 08/10/17 08/10/18  Dorothy Spark, MD  gabapentin (NEURONTIN) 100 MG capsule Take 1 capsule (100 mg total) by mouth 2 (two) times daily. 05/30/18   Rayburn, Floyce Stakes, PA-C  metoprolol succinate (TOPROL XL) 50 MG 24 hr tablet Take 1 tablet (50 mg total) by mouth daily. Take with or immediately following a meal. 05/24/17   Imogene Burn, PA-C  pantoprazole (PROTONIX) 40 MG tablet Take 1 tablet (40 mg total) by mouth daily. 06/23/18   Shelly Coss, MD  polyethylene glycol (MIRALAX) packet Take 17 g by mouth daily. 05/30/18   Rayburn, Floyce Stakes, PA-C  pravastatin (PRAVACHOL) 40 MG tablet I po q hs Patient taking differently: Take 40 mg by mouth daily.  01/17/18   Opalski, Neoma Laming, DO  traMADol (ULTRAM) 50 MG tablet Take 1 tablet (50 mg total) by mouth every 12 (twelve) hours as needed for moderate pain. 06/22/18   Shelly Coss, MD     Family History  Problem Relation Age of Onset  . Cancer Mother  Brain Cancer  . Diabetes Father   . Heart disease Father        CAD  . Hypertension Father   . Stroke Father   . Diabetes Sister   . Stomach cancer Neg Hx   . Pancreatic cancer Neg Hx   . Colon cancer Neg Hx   . Esophageal cancer Neg Hx     Social History   Socioeconomic History  . Marital status: Married    Spouse name: Not on file  . Number of children: 4  . Years of education: Not on file  . Highest education level: Not on file  Occupational History  . Occupation: Retired Biomedical scientist  . Financial resource strain: Not on file  . Food insecurity:    Worry: Not on file    Inability: Not on file  . Transportation needs:    Medical: Not on file    Non-medical: Not on file  Tobacco Use  . Smoking status: Former Smoker    Packs/day: 1.25    Years: 50.00    Pack years: 62.50    Types: Cigarettes    Last attempt to quit: 11/11/2008    Years since quitting: 9.6    . Smokeless tobacco: Former Systems developer    Types: Chew  Substance and Sexual Activity  . Alcohol use: Yes    Alcohol/week: 6.0 standard drinks    Types: 2 Glasses of wine, 4 Cans of beer per week  . Drug use: No  . Sexual activity: Never  Lifestyle  . Physical activity:    Days per week: Not on file    Minutes per session: Not on file  . Stress: Not on file  Relationships  . Social connections:    Talks on phone: Not on file    Gets together: Not on file    Attends religious service: Not on file    Active member of club or organization: Not on file    Attends meetings of clubs or organizations: Not on file    Relationship status: Not on file  Other Topics Concern  . Not on file  Social History Narrative   ** Merged History Encounter **       4 sons    Review of Systems: A 12 point ROS discussed and pertinent positives are indicated in the HPI above.  All other systems are negative.  Review of Systems  Constitutional: Positive for chills (none today - wife reports several episodes last week), fatigue and fever (x1 last week which resolved with Tylenol). Negative for appetite change.  Respiratory: Positive for shortness of breath (baseline; on continuous oxygen via [place]).   Cardiovascular: Negative for chest pain.  Gastrointestinal: Positive for abdominal pain. Negative for abdominal distention, nausea and vomiting.  Skin: Negative for rash.  Neurological: Positive for dizziness. Negative for syncope.  Psychiatric/Behavioral: Positive for confusion.    Vital Signs: There were no vitals taken for this visit.  Physical Exam  Constitutional: No distress.  HENT:  Head: Normocephalic.  Pulmonary/Chest: Effort normal.  O2 via [place]  Abdominal: He exhibits no distension. There is tenderness (at RLQ drain insertion site predominantly - minimal pain on palpation of RUQ drain insertion site).  RLQ drain insertion site clean, dry, intact, dressed appropriately; tubing to gravity bag  without drainage present. RUQ drain insertion site clean, dry, intact, dressed appropriately; tubing to gravity bag with scan purulent drainage present.  Neurological: He is alert.  Skin: Skin is warm and dry. He  is not diaphoretic.  Psychiatric: He has a normal mood and affect. His behavior is normal.    Imaging: Dg Chest 1 View  Result Date: 06/18/2018 CLINICAL DATA:  Hypoxia EXAM: CHEST  1 VIEW COMPARISON:  June 17, 2017 FINDINGS: No pneumothorax. Stable mild cardiomegaly. Small effusions with underlying atelectasis. No overt edema or other abnormality. IMPRESSION: Mild cardiomegaly with small pleural effusions and underlying atelectasis. Electronically Signed   By: Dorise Bullion III M.D   On: 06/18/2018 07:17   Dg Chest 1 View  Result Date: 06/17/2018 CLINICAL DATA:  84 year old male status post right-sided thoracentesis EXAM: CHEST  1 VIEW COMPARISON:  06/16/2018 FINDINGS: Cardiomediastinal silhouette unchanged in size and contour. Surgical changes of prior median sternotomy and CABG. Improved opacity at the right lung base, status post thoracentesis. No evidence of pneumothorax. Lucency associated with the right hemidiaphragm is favored to represent increasing subdiaphragmatic gas component within the abdomen, present on the most recent CT scan. Blunting of the left costophrenic angle. Patchy airspace opacities at the lung bases. IMPRESSION: Improved appearance of right-sided pleural fluid status post thoracentesis, with no complicating features. Trace left-sided pleural effusion. Electronically Signed   By: Corrie Mckusick D.O.   On: 06/17/2018 09:24   Ct Chest W Contrast  Result Date: 06/12/2018 CLINICAL DATA:  RIGHT colectomy 05/19/2018 for colon adenocarcinoma. Suspicion pneumoperitoneum 1 chest radiograph EXAM: CT CHEST, ABDOMEN, AND PELVIS WITH CONTRAST TECHNIQUE: Multidetector CT imaging of the chest, abdomen and pelvis was performed following the standard protocol during bolus  administration of intravenous contrast. CONTRAST:  161mL OMNIPAQUE IOHEXOL 300 MG/ML  SOLN COMPARISON:  Radiograph 06/09/2018, CT 05/18/2018 FINDINGS: CT CHEST FINDINGS Cardiovascular: Post CABG anatomy. No large pulmonary emboli identified. No pericardial effusion. Mediastinum/Nodes: No axillary supraclavicular adenopathy. No mediastinal hilar adenopathy. Esophagus normal. Lungs/Pleura: Moderate sized RIGHT pleural effusion with basilar atelectasis. Mild LEFT basilar atelectasis. No pneumonia. No pneumothorax. No pulmonary edema. Musculoskeletal: Degenerate spurring of the spine. Midline sternotomy. CT ABDOMEN AND PELVIS FINDINGS Hepatobiliary: Extensive extraperitoneal free air position anterior to the liver and lateral to the liver. No focal hepatic lesion. Gallbladder collapsed. Portal veins patent. Pancreas: Pancreas is normal. No ductal dilatation. No pancreatic inflammation. Spleen: Normal spleen Adrenals/urinary tract: Stable nodule of the LEFT adrenal gland measures 2.2 cm. Previously characterized as adenoma. Stomach/Bowel: Stomach, duodenum, small-bowel appear normal. RIGHT hemicolectomy anatomy. No obstruction at the anastomosis. Oral contrast passes through the small bowel into the transverse colon through the anastomosis without gross evidence of leak. There is however large volume intraperitoneal free air in the RIGHT upper quadrant. This suggest perforated bowel or anastomotic breakdown. The transverse and descending colon are normal. Rectum appears normal. Within the central lower RIGHT abdomen large fluid collection measuring 14.5 by 11.6 cm. This collection is homogeneous low simple fluid attenuation cyst in centrally with a thin enhancing rim consistent with peritoneal abscess. (Image 101/2). The collection has a thin arm which extends to the LEFT pericolic gutter. No significant free fluid in the pelvis. Small amount free fluid anterior to the RIGHT hepatic lobe also with a peritoneal  enhancement (image 72/2). Vascular/Lymphatic: Abdominal aorta is normal caliber with atherosclerotic calcification. There is no retroperitoneal or periportal lymphadenopathy. No pelvic lymphadenopathy. Reproductive: Prostate normal Other: Large intraperitoneal free air in the RIGHT upper quadrant described in the stomach bowel section. Large peritoneal abscess as described in the bowel section. Musculoskeletal: No aggressive osseous lesion. IMPRESSION: 1. Evidence all bowel perforation/anastomosed breakdown with large volume intraperitoneal free air fluid in the RIGHT upper  quadrant adjacent and above the liver. 2. Large loculated fluid collection in the RIGHT anterior pelvis extending along the LEFT pericolic gutter consists with PERITONEAL ABSCESS. 3. Of note, no large volume intraperitoneal free fluid. Oral contrast passed through the RIGHT colon anastomosis without evidence of leak. Findings could indicate a prior anastomotic breakdown which has healed in the interval with subsequent abscess formation from the prior leak / breakdown. Critical Value/emergent results were called by telephone at the time of interpretation on 06/12/2018 at 3:12 pm to Dr. Marylu Lund , who verbally acknowledged these results. Electronically Signed   By: Suzy Bouchard M.D.   On: 06/12/2018 15:12   Ct Abdomen Pelvis W Contrast  Result Date: 06/20/2018 CLINICAL DATA:  Anastomotic leak after right hemicolectomy and status post placement right pelvic percutaneous drainage catheter into large abscess on 06/13/2018 and additional right upper quadrant percutaneous drain in to large cavity predominantly containing air on 06/17/2018. The right pelvic drainage catheter shows diminished output clinically. The right upper quadrant drainage catheter continues to have feculent output. EXAM: CT ABDOMEN AND PELVIS WITH CONTRAST TECHNIQUE: Multidetector CT imaging of the abdomen and pelvis was performed using the standard protocol following  bolus administration of intravenous contrast. CONTRAST:  16mL OMNIPAQUE IOHEXOL 300 MG/ML  SOLN COMPARISON:  06/16/2018 FINDINGS: Lower chest: There is a small right pleural effusion and a trace amount of left pleural fluid. Bibasilar atelectasis present. Hepatobiliary: No focal liver abnormality is seen. No gallstones, gallbladder wall thickening, or biliary dilatation. Pancreas: Unremarkable. No pancreatic ductal dilatation or surrounding inflammatory changes. Spleen: Normal in size.  Stable calcified granulomata. Adrenals/Urinary Tract: Adrenal glands are unremarkable. Kidneys are normal, without renal calculi, focal lesion, or hydronephrosis. Bladder is unremarkable. Stomach/Bowel: No evidence of bowel obstruction. Vascular/Lymphatic: No significant vascular findings are present. No enlarged abdominal or pelvic lymph nodes. Reproductive: Prostate is unremarkable. Other: At the level of the indwelling right upper quadrant percutaneous drain, the collection of predominantly air as well as fluid present previously has been decompressed. There remains a tiny rim of fluid as well as small bubbles of extraluminal air that extend to abut the superior liver. At the level of the right pelvic drainage catheter, the residual component of abscess shows some decrease in size since the prior study on 09/06 with some fluid remaining around the pigtail portion of the drainage catheter. The area of fluid now measures roughly 4 x 6 cm compared to 5 x 8 cm previously. No new abscess identified. Musculoskeletal: No acute or significant osseous findings. IMPRESSION: 1. Decompression of right upper quadrant air and fluid collection after percutaneous catheter drainage. There remains a tiny rim of fluid as well as small extraluminal gas bubbles in this region suspicious for continued leak from bowel. 2. Further decrease in size of residual abscess fluid collection in the pelvis after percutaneous catheter drainage. There remains some  fluid around the drainage catheter in the right pelvis. Electronically Signed   By: Aletta Edouard M.D.   On: 06/20/2018 17:26   Ct Abdomen Pelvis W Contrast  Result Date: 06/16/2018 CLINICAL DATA:  F/u abdominal drain placed 9/3. Pt with recent surgery, Fever post-op "Colon cancer, abdominal fluid collection EXAM: CT ABDOMEN AND PELVIS WITH CONTRAST TECHNIQUE: Multidetector CT imaging of the abdomen and pelvis was performed using the standard protocol following bolus administration of intravenous contrast. CONTRAST:  159mL OMNIPAQUE IOHEXOL 300 MG/ML  SOLN COMPARISON:  06/13/2013 FINDINGS: Lower chest: Moderate right and trace left pleural effusions as before. Atelectasis/consolidation posteriorly in the  visualized lung bases. No pneumothorax. Previous median sternotomy and CABG. Hepatobiliary: No focal liver abnormality is seen. No gallstones, gallbladder wall thickening, or biliary dilatation. Pancreas: Unremarkable. No pancreatic ductal dilatation or surrounding inflammatory changes. Spleen: Punctate calcified granulomas.  Normal size and enhancement. Adrenals/Urinary Tract: Stable 2.2 cm left adrenal nodule. Right adrenal unremarkable. Stable left renal cyst. No hydronephrosis. Urinary bladder incompletely distended. Stomach/Bowel: Stomach is partially distended. Small bowel nondilated. Some circumferential wall thickening in nondilated small bowel loops in the right lateral abdomen below the gallbladder. Changes of right hemicolectomy. Residual colon is nondistended. Vascular/Lymphatic: Heavy aortoiliac arterial calcifications without aneurysm. Portal vein patent. No abdominal or pelvic adenopathy. Reproductive: Mild prostatic enlargement. Other: Stable right lower quadrant pigtail drain catheter. Interval decrease in size of residual collection measuring approximately 7.8 x 5.1 x3 cm (previously 14.7 x 11.6 x8.9). There has been interval increase in size of loculated gas and fluid collection, right  subphrenic extending anteriorly in the right upper quadrant, measuring approximately 16.6 x 7.1 x17.7cm (previously 16.2 x 3.4 x 14.9cm). No new intra-abdominal collections. Musculoskeletal: Previous lower lumbar decompression surgery. Spondylitic changes in the visualized lower thoracic and lumbar spine. Negative for fracture or worrisome bone lesion. IMPRESSION: 1. Partial resolution of right lower quadrant collection post percutaneous drainage. The drain catheter is well positioned in the residual collection. 2. Enlarging right subphrenic and right upper quadrant gas and fluid collection. This would be approachable for percutaneous drainage if needed. Electronically Signed   By: Lucrezia Europe M.D.   On: 06/16/2018 13:35   Ct Abdomen Pelvis W Contrast  Result Date: 06/12/2018 CLINICAL DATA:  RIGHT colectomy 05/19/2018 for colon adenocarcinoma. Suspicion pneumoperitoneum 1 chest radiograph EXAM: CT CHEST, ABDOMEN, AND PELVIS WITH CONTRAST TECHNIQUE: Multidetector CT imaging of the chest, abdomen and pelvis was performed following the standard protocol during bolus administration of intravenous contrast. CONTRAST:  122mL OMNIPAQUE IOHEXOL 300 MG/ML  SOLN COMPARISON:  Radiograph 06/09/2018, CT 05/18/2018 FINDINGS: CT CHEST FINDINGS Cardiovascular: Post CABG anatomy. No large pulmonary emboli identified. No pericardial effusion. Mediastinum/Nodes: No axillary supraclavicular adenopathy. No mediastinal hilar adenopathy. Esophagus normal. Lungs/Pleura: Moderate sized RIGHT pleural effusion with basilar atelectasis. Mild LEFT basilar atelectasis. No pneumonia. No pneumothorax. No pulmonary edema. Musculoskeletal: Degenerate spurring of the spine. Midline sternotomy. CT ABDOMEN AND PELVIS FINDINGS Hepatobiliary: Extensive extraperitoneal free air position anterior to the liver and lateral to the liver. No focal hepatic lesion. Gallbladder collapsed. Portal veins patent. Pancreas: Pancreas is normal. No ductal dilatation.  No pancreatic inflammation. Spleen: Normal spleen Adrenals/urinary tract: Stable nodule of the LEFT adrenal gland measures 2.2 cm. Previously characterized as adenoma. Stomach/Bowel: Stomach, duodenum, small-bowel appear normal. RIGHT hemicolectomy anatomy. No obstruction at the anastomosis. Oral contrast passes through the small bowel into the transverse colon through the anastomosis without gross evidence of leak. There is however large volume intraperitoneal free air in the RIGHT upper quadrant. This suggest perforated bowel or anastomotic breakdown. The transverse and descending colon are normal. Rectum appears normal. Within the central lower RIGHT abdomen large fluid collection measuring 14.5 by 11.6 cm. This collection is homogeneous low simple fluid attenuation cyst in centrally with a thin enhancing rim consistent with peritoneal abscess. (Image 101/2). The collection has a thin arm which extends to the LEFT pericolic gutter. No significant free fluid in the pelvis. Small amount free fluid anterior to the RIGHT hepatic lobe also with a peritoneal enhancement (image 72/2). Vascular/Lymphatic: Abdominal aorta is normal caliber with atherosclerotic calcification. There is no retroperitoneal or periportal lymphadenopathy. No  pelvic lymphadenopathy. Reproductive: Prostate normal Other: Large intraperitoneal free air in the RIGHT upper quadrant described in the stomach bowel section. Large peritoneal abscess as described in the bowel section. Musculoskeletal: No aggressive osseous lesion. IMPRESSION: 1. Evidence all bowel perforation/anastomosed breakdown with large volume intraperitoneal free air fluid in the RIGHT upper quadrant adjacent and above the liver. 2. Large loculated fluid collection in the RIGHT anterior pelvis extending along the LEFT pericolic gutter consists with PERITONEAL ABSCESS. 3. Of note, no large volume intraperitoneal free fluid. Oral contrast passed through the RIGHT colon anastomosis  without evidence of leak. Findings could indicate a prior anastomotic breakdown which has healed in the interval with subsequent abscess formation from the prior leak / breakdown. Critical Value/emergent results were called by telephone at the time of interpretation on 06/12/2018 at 3:12 pm to Dr. Marylu Lund , who verbally acknowledged these results. Electronically Signed   By: Suzy Bouchard M.D.   On: 06/12/2018 15:12   Dg Chest Port 1 View  Result Date: 06/15/2018 CLINICAL DATA:  Dyspnea EXAM: PORTABLE CHEST 1 VIEW COMPARISON:  Chest x-rays dated 06/09/2018 and 05/22/2018. FINDINGS: Heart size and mediastinal contours are stable. Lungs are clear. No pleural effusion or pneumothorax seen. Diminished free intraperitoneal air under the RIGHT hemidiaphragm IMPRESSION: 1. No evidence of acute cardiopulmonary abnormality. No evidence of pneumonia or pulmonary edema. 2. Decreased free intraperitoneal air under the RIGHT hemidiaphragm. Electronically Signed   By: Franki Cabot M.D.   On: 06/15/2018 10:18   Dg Chest Port 1 View  Result Date: 06/09/2018 CLINICAL DATA:  Shortness of breath. EXAM: PORTABLE CHEST 1 VIEW COMPARISON:  Radiograph of June 06, 2018 and June 02, 2018. FINDINGS: Stable cardiomediastinal silhouette. Status post coronary bypass graft. Atherosclerosis of thoracic aorta is noted. No pneumothorax is noted. Lungs are clear. No significant pleural effusion is noted. There is recurrence of pneumoperitoneum underneath the right hemidiaphragm. Bony thorax is unremarkable. IMPRESSION: No acute cardiopulmonary abnormality seen. Recurrence of pneumoperitoneum is noted under right hemidiaphragm concerning for postoperative change or rupture of hollow viscus. These results will be called to the ordering clinician or representative by the Radiologist Assistant, and communication documented in the PACS or zVision Dashboard. Aortic Atherosclerosis (ICD10-I70.0). Electronically Signed   By: Marijo Conception, M.D.   On: 06/09/2018 11:59   Dg Chest Port 1 View  Result Date: 06/06/2018 CLINICAL DATA:  Shortness of breath, hypertension, CABG in May 2018, colonic malignancy, former smoker. EXAM: PORTABLE CHEST 1 VIEW COMPARISON:  PA and lateral chest x-ray of June 02, 2018 FINDINGS: The free subdiaphragmatic bowel gas seen previously has resolved. The lungs are borderline hypoinflated. The lung markings are coarse at both bases with possible small bilateral pleural effusions. There is no pleural effusion. The heart and pulmonary vascularity are normal. There are post CABG changes. There is calcification in the wall of the aortic arch. The bony thorax exhibits no acute abnormality. IMPRESSION: Borderline hypoinflation. Probable bibasilar atelectasis and small effusions greatest on the right. No CHF. When the patient can tolerate the procedure, a PA and lateral chest x-ray with deep inspiratory effort would be useful. Thoracic aortic atherosclerosis. Electronically Signed   By: David  Martinique M.D.   On: 06/06/2018 10:05   Dg Abd Acute W/chest  Result Date: 06/19/2018 CLINICAL DATA:  Abdominal pain EXAM: DG ABDOMEN ACUTE W/ 1V CHEST COMPARISON:  06/18/2018 FINDINGS: Cardiac shadow is mildly enlarged but stable. Postoperative changes are seen. Aortic calcifications are again identified. Small bilateral pleural effusions are  noted. Right upper quadrant drainage catheter is again noted and stable. Scattered large and small bowel gas is seen. Right lower quadrant drainage catheter is also noted and stable. Degenerative changes of lumbar spine are seen. No free air is noted. No other focal abnormality is seen. IMPRESSION: Drainage catheters within the abdomen consistent with the given clinical history. Bilateral small effusions. Electronically Signed   By: Inez Catalina M.D.   On: 06/19/2018 11:18   Ct Image Guided Drainage By Percutaneous Catheter  Result Date: 06/17/2018 CLINICAL DATA:  Colon carcinoma, post partial  colectomy. Enlarging right upper quadrant gas and fluid collection. EXAM: CT GUIDED DRAINAGE OF RIGHT UPPER QUADRANT PERITONEAL ABSCESS ANESTHESIA/SEDATION: None required PROCEDURE: The procedure, risks, benefits, and alternatives were explained to the patient. Questions regarding the procedure were encouraged and answered. The patient understands and consents to the procedure. Select axial scans through the upper abdomen obtained. The collection was localized and an appropriate skin entry site was determined and marked. The operative field was prepped with chlorhexidinein a sterile fashion, and a sterile drape was applied covering the operative field. A sterile gown and sterile gloves were used for the procedure. Local anesthesia was provided with 1% Lidocaine. Under CT fluoroscopic guidance, a 19 gauge percutaneous entry needle was advanced to the collection. Gas could be aspirated. An Amplatz guidewire advanced easily, its position confirmed on CT. Tract dilated to facilitate placement of a 12 French pigtail drain catheter, directed cephalad towards the subphrenic component of the collection. A sample of the thin cloudy yellow aspirate was sent for Gram stain and culture. Catheter secured externally with 0 Prolene suture and StatLock and placed to gravity drain bag. The patient tolerated the procedure well. COMPLICATIONS: None immediate FINDINGS: Gas and fluid collection in the right upper anterior peritoneal cavity just inferior to the liver was again localized. 12 French pigtail drain catheter placed as above. Sample of the aspirate sent for Gram stain and culture. IMPRESSION: 1. Technically successful CT-guided right upper quadrant peritoneal abscess drain catheter placement. Electronically Signed   By: Lucrezia Europe M.D.   On: 06/17/2018 13:35   Ct Image Guided Drainage By Percutaneous Catheter  Result Date: 06/13/2018 INDICATION: 84 year old male with a history of right lower quadrant fluid collection. He  has been referred for drainage. EXAM: CT-GUIDED DRAINAGE OF RIGHT LOWER QUADRANT FLUID COLLECTION MEDICATIONS: The patient is currently admitted to the hospital and receiving intravenous antibiotics. The antibiotics were administered within an appropriate time frame prior to the initiation of the procedure. ANESTHESIA/SEDATION: 2.0 mg IV Versed 100 mcg IV Fentanyl Moderate Sedation Time:  22 minutes The patient was continuously monitored during the procedure by the interventional radiology nurse under my direct supervision. COMPLICATIONS: None TECHNIQUE: Informed written consent was obtained from the patient after a thorough discussion of the procedural risks, benefits and alternatives. All questions were addressed. Maximal Sterile Barrier Technique was utilized including caps, mask, sterile gowns, sterile gloves, sterile drape, hand hygiene and skin antiseptic. A timeout was performed prior to the initiation of the procedure. PROCEDURE: The right lower quadrant was prepped with chlorhexidine in a sterile fashion, and a sterile drape was applied covering the operative field. A sterile gown and sterile gloves were used for the procedure. Local anesthesia was provided with 1% Lidocaine. Once the patient is prepped and draped in the usual sterile fashion, 1% lidocaine was used for local anesthesia. Using CT guidance, trocar needle was advanced into the fluid collection of the right lower quadrant. Modified Seldinger technique was  used to place a 12 [place] drain. Drain was sutured in position. Approximately 450 cc of murky yellow fluid aspirated sample was sent for culture. Drain was attached to gravity drainage. Patient tolerated the procedure well and remained hemodynamically stable throughout. No complications were encountered and no significant blood loss. FINDINGS: CT again demonstrates fluid collection the right lower quadrant. Status post drain placement, there is significant reduction in fluid after aspiration  of 450 cc of fluid. IMPRESSION: Status post drain placement of right lower quadrant fluid collection. Signed, Dulcy Fanny. Dellia Nims, RPVI [facility] Radiology Electronically Signed   By: Corrie Mckusick D.O.   On: 06/13/2018 17:17   US Thoracentesis Asp Pleural Space W/img Guide  Result Date: 06/17/2018 INDICATION: Since surgery for colon cancer. Right pleural effusion. Request for diagnostic and therapeutic thoracentesis. EXAM: ULTRASOUND GUIDED RIGHT THORACENTESIS MEDICATIONS: 1% lidocaine 10 mL COMPLICATIONS: None immediate. PROCEDURE: An ultrasound guided thoracentesis was thoroughly discussed with the patient and questions answered. The benefits, risks, alternatives and complications were also discussed. The patient understands and wishes to proceed with the procedure. Written consent was obtained. Ultrasound was performed to localize and mark an adequate pocket of fluid in the right chest. The area was then prepped and draped in the normal sterile fashion. 1% Lidocaine was used for local anesthesia. Under ultrasound guidance a 6 Fr Safe-T-Centesis catheter was introduced. Thoracentesis was performed. The catheter was removed and a dressing applied. FINDINGS: A total of approximately 1 L of clear yellow fluid was removed. Samples were sent to the laboratory as requested by the clinical team. IMPRESSION: Successful ultrasound guided right thoracentesis yielding 1 L of pleural fluid. No pneumothorax on post-procedure chest x-ray. Read by: Gareth Eagle, PA-C Electronically Signed   By: Corrie Mckusick D.O.   On: 06/17/2018 10:13    Labs:  CBC: Recent Labs    06/17/18 0506 06/18/18 0430 06/19/18 0429 06/21/18 0422  WBC 17.9* 12.8* 8.3 7.8  HGB 9.5* 9.4* 9.4* 8.8*  HCT 30.6* 29.9* 30.3* 28.3*  PLT 291 269 276 297    COAGS: Recent Labs    06/02/18 1801 06/13/18 0957  INR 1.15 1.20  APTT  --  37*    BMP: Recent Labs    06/17/18 0506  06/18/18 0430 06/19/18 0429 06/21/18 0422  NA 137 140 139 139  K 4.5 3.8 4.0 4.2  CL 98 99 95* 95*  CO2 31 32 34* 35*  GLUCOSE 146* 115* 126* 104*  BUN 20 27* 22 18  CALCIUM 8.4* 8.4* 8.2* 8.4*  CREATININE 1.35* 1.37* 0.99 0.86  GFRNONAA 47* 46* >60 >60  GFRAA 54* 53* >60 >60    LIVER FUNCTION TESTS: Recent Labs    05/18/18 1851 06/02/18 1700 06/13/18 0438 06/14/18 0451  BILITOT 0.9 0.5 0.7 0.4  AST 28 47* 29 20  ALT 11 34 33 26  ALKPHOS 90 121 90 79  PROT 7.2 5.7* 5.3* 5.2*  ALBUMIN 4.2 2.3* 2.2* 1.9*    TUMOR MARKERS: No results for input(s): AFPTM, CEA, CA199, CHROMGRNA in the last 8760 hours.  Assessment:  Follow-up intra-abdominal abscess drains x 2 placed by IR - RLQ drain placed 06/13/18 by Dr. Earleen Newport and RUQ drain placed 06/17/18 by Dr. Vernard Gambles. Per SNF chart brought with patient today RLQ drain with 2-15 cc of output and RUQ with 7-50 cc of output - they are flushing drains daily per patient's wife.   CT abdomen/pelvis reviewed with Dr. Kathlene Cote today who recommended injection of  both drains due to concern for fistulous connection. Both drains were viewed by Dr. Kathlene Cote using fluoroscopy and it was determined that neither showed definite fistulous connections. RLQ collection has decreased but remains present - changed to JP drain today, SNF is to continue to flush QD with 5 cc NS and record output. RUQ collection has decreased in size however drain to remain for now - remains to gravity, SNF is to continue to flush QD with 5 cc NS and record output.   We will see patient back in 2 weeks for repeat drain injection to assess progress and for possible removal of RUQ drain. Plan discussed with wife and patient who stated understanding, all question answered.    Electronically Signed: Joaquim Nam PA-C 07/04/2018, 3:02 PM   Please refer to Dr. Margaretmary Dys attestation of this note for management and plan.

## 2018-07-04 NOTE — ED Notes (Signed)
Patient transported to X-ray 

## 2018-07-05 ENCOUNTER — Encounter (HOSPITAL_COMMUNITY): Payer: Self-pay | Admitting: Family Medicine

## 2018-07-05 DIAGNOSIS — I2510 Atherosclerotic heart disease of native coronary artery without angina pectoris: Secondary | ICD-10-CM | POA: Diagnosis not present

## 2018-07-05 DIAGNOSIS — I5032 Chronic diastolic (congestive) heart failure: Secondary | ICD-10-CM

## 2018-07-05 DIAGNOSIS — I10 Essential (primary) hypertension: Secondary | ICD-10-CM

## 2018-07-05 DIAGNOSIS — I739 Peripheral vascular disease, unspecified: Secondary | ICD-10-CM

## 2018-07-05 DIAGNOSIS — C182 Malignant neoplasm of ascending colon: Secondary | ICD-10-CM | POA: Diagnosis not present

## 2018-07-05 DIAGNOSIS — R188 Other ascites: Secondary | ICD-10-CM | POA: Diagnosis not present

## 2018-07-05 DIAGNOSIS — I480 Paroxysmal atrial fibrillation: Secondary | ICD-10-CM | POA: Diagnosis not present

## 2018-07-05 DIAGNOSIS — L03311 Cellulitis of abdominal wall: Secondary | ICD-10-CM | POA: Diagnosis not present

## 2018-07-05 LAB — CBC WITH DIFFERENTIAL/PLATELET
Abs Immature Granulocytes: 0 10*3/uL (ref 0.0–0.1)
BASOS ABS: 0 10*3/uL (ref 0.0–0.1)
Basophils Relative: 0 %
EOS ABS: 0.1 10*3/uL (ref 0.0–0.7)
Eosinophils Relative: 1 %
HEMATOCRIT: 29.6 % — AB (ref 39.0–52.0)
Hemoglobin: 8.8 g/dL — ABNORMAL LOW (ref 13.0–17.0)
Immature Granulocytes: 0 %
LYMPHS ABS: 1.5 10*3/uL (ref 0.7–4.0)
Lymphocytes Relative: 24 %
MCH: 26.4 pg (ref 26.0–34.0)
MCHC: 29.7 g/dL — ABNORMAL LOW (ref 30.0–36.0)
MCV: 88.9 fL (ref 78.0–100.0)
Monocytes Absolute: 0.7 10*3/uL (ref 0.1–1.0)
Monocytes Relative: 11 %
Neutro Abs: 4.2 10*3/uL (ref 1.7–7.7)
Neutrophils Relative %: 64 %
Platelets: 239 10*3/uL (ref 150–400)
RBC: 3.33 MIL/uL — AB (ref 4.22–5.81)
RDW: 16.4 % — AB (ref 11.5–15.5)
WBC: 6.5 10*3/uL (ref 4.0–10.5)

## 2018-07-05 LAB — BASIC METABOLIC PANEL
Anion gap: 10 (ref 5–15)
BUN: 8 mg/dL (ref 8–23)
CHLORIDE: 105 mmol/L (ref 98–111)
CO2: 23 mmol/L (ref 22–32)
CREATININE: 0.86 mg/dL (ref 0.61–1.24)
Calcium: 7.6 mg/dL — ABNORMAL LOW (ref 8.9–10.3)
Glucose, Bld: 111 mg/dL — ABNORMAL HIGH (ref 70–99)
POTASSIUM: 3.2 mmol/L — AB (ref 3.5–5.1)
SODIUM: 138 mmol/L (ref 135–145)

## 2018-07-05 LAB — BRAIN NATRIURETIC PEPTIDE: B NATRIURETIC PEPTIDE 5: 227.2 pg/mL — AB (ref 0.0–100.0)

## 2018-07-05 LAB — GLUCOSE, CAPILLARY: GLUCOSE-CAPILLARY: 96 mg/dL (ref 70–99)

## 2018-07-05 LAB — I-STAT CG4 LACTIC ACID, ED: Lactic Acid, Venous: 0.91 mmol/L (ref 0.5–1.9)

## 2018-07-05 MED ORDER — ALBUTEROL SULFATE (2.5 MG/3ML) 0.083% IN NEBU
2.5000 mg | INHALATION_SOLUTION | Freq: Four times a day (QID) | RESPIRATORY_TRACT | Status: DC | PRN
  Administered 2018-07-05: 2.5 mg via RESPIRATORY_TRACT
  Filled 2018-07-05: qty 3

## 2018-07-05 MED ORDER — ONDANSETRON HCL 4 MG PO TABS: 4.0000 mg | ORAL_TABLET | Freq: Four times a day (QID) | ORAL | Status: DC | PRN

## 2018-07-05 MED ORDER — GUAIFENESIN ER 600 MG PO TB12
600.0000 mg | ORAL_TABLET | Freq: Two times a day (BID) | ORAL | Status: DC
  Administered 2018-07-05 – 2018-07-06 (×3): 600 mg via ORAL
  Filled 2018-07-05 (×3): qty 1

## 2018-07-05 MED ORDER — TRAMADOL HCL 50 MG PO TABS
50.0000 mg | ORAL_TABLET | Freq: Two times a day (BID) | ORAL | Status: DC | PRN
  Administered 2018-07-06: 50 mg via ORAL
  Filled 2018-07-05: qty 1

## 2018-07-05 MED ORDER — FUROSEMIDE 40 MG PO TABS
40.0000 mg | ORAL_TABLET | Freq: Every day | ORAL | Status: DC
  Administered 2018-07-05: 40 mg via ORAL
  Filled 2018-07-05: qty 1

## 2018-07-05 MED ORDER — PNEUMOCOCCAL 13-VAL CONJ VACC IM SUSP: 0.5000 mL | Freq: Once | INTRAMUSCULAR | Status: DC

## 2018-07-05 MED ORDER — ONDANSETRON HCL 4 MG/2ML IJ SOLN: 4.0000 mg | Freq: Four times a day (QID) | INTRAMUSCULAR | Status: DC | PRN

## 2018-07-05 MED ORDER — ADULT MULTIVITAMIN W/MINERALS CH
1.0000 | ORAL_TABLET | Freq: Every day | ORAL | Status: DC
  Administered 2018-07-05 – 2018-07-06 (×2): 1 via ORAL
  Filled 2018-07-05 (×2): qty 1

## 2018-07-05 MED ORDER — CLINDAMYCIN PHOSPHATE 600 MG/50ML IV SOLN
600.0000 mg | Freq: Once | INTRAVENOUS | Status: AC
  Administered 2018-07-05: 600 mg via INTRAVENOUS
  Filled 2018-07-05: qty 50

## 2018-07-05 MED ORDER — BISACODYL 5 MG PO TBEC: 5.0000 mg | DELAYED_RELEASE_TABLET | Freq: Every day | ORAL | Status: DC | PRN

## 2018-07-05 MED ORDER — OXYCODONE HCL 5 MG PO TABS: 5.0000 mg | ORAL_TABLET | ORAL | Status: DC | PRN

## 2018-07-05 MED ORDER — SODIUM CHLORIDE 0.9 % IV SOLN: 250.0000 mL | INTRAVENOUS | Status: DC | PRN

## 2018-07-05 MED ORDER — ACETAMINOPHEN 325 MG PO TABS: 650.0000 mg | ORAL_TABLET | Freq: Four times a day (QID) | ORAL | Status: DC | PRN

## 2018-07-05 MED ORDER — METOPROLOL SUCCINATE ER 50 MG PO TB24
50.0000 mg | ORAL_TABLET | Freq: Every day | ORAL | Status: DC
  Administered 2018-07-05 – 2018-07-06 (×2): 50 mg via ORAL
  Filled 2018-07-05 (×2): qty 1

## 2018-07-05 MED ORDER — SODIUM CHLORIDE 0.9% FLUSH: 3.0000 mL | INTRAVENOUS | Status: DC | PRN

## 2018-07-05 MED ORDER — PRAVASTATIN SODIUM 40 MG PO TABS
40.0000 mg | ORAL_TABLET | Freq: Every day | ORAL | Status: DC
  Administered 2018-07-05: 40 mg via ORAL
  Filled 2018-07-05: qty 1

## 2018-07-05 MED ORDER — POTASSIUM CHLORIDE CRYS ER 20 MEQ PO TBCR
40.0000 meq | EXTENDED_RELEASE_TABLET | Freq: Once | ORAL | Status: AC
  Administered 2018-07-05: 40 meq via ORAL
  Filled 2018-07-05: qty 2

## 2018-07-05 MED ORDER — GABAPENTIN 100 MG PO CAPS
100.0000 mg | ORAL_CAPSULE | Freq: Two times a day (BID) | ORAL | Status: DC
  Administered 2018-07-05 – 2018-07-06 (×3): 100 mg via ORAL
  Filled 2018-07-05 (×3): qty 1

## 2018-07-05 MED ORDER — FUROSEMIDE 10 MG/ML IJ SOLN
40.0000 mg | Freq: Two times a day (BID) | INTRAMUSCULAR | Status: DC
  Administered 2018-07-05 – 2018-07-06 (×2): 40 mg via INTRAVENOUS
  Filled 2018-07-05 (×2): qty 4

## 2018-07-05 MED ORDER — POTASSIUM CHLORIDE CRYS ER 20 MEQ PO TBCR
20.0000 meq | EXTENDED_RELEASE_TABLET | Freq: Once | ORAL | Status: AC
  Administered 2018-07-05: 20 meq via ORAL
  Filled 2018-07-05: qty 1

## 2018-07-05 MED ORDER — ACETAMINOPHEN 650 MG RE SUPP: 650.0000 mg | Freq: Four times a day (QID) | RECTAL | Status: DC | PRN

## 2018-07-05 MED ORDER — ASPIRIN EC 81 MG PO TBEC
81.0000 mg | DELAYED_RELEASE_TABLET | Freq: Every day | ORAL | Status: DC
  Administered 2018-07-05 – 2018-07-06 (×2): 81 mg via ORAL
  Filled 2018-07-05 (×2): qty 1

## 2018-07-05 MED ORDER — POTASSIUM CHLORIDE CRYS ER 20 MEQ PO TBCR
40.0000 meq | EXTENDED_RELEASE_TABLET | Freq: Two times a day (BID) | ORAL | Status: AC
  Administered 2018-07-05 – 2018-07-06 (×3): 40 meq via ORAL
  Filled 2018-07-05 (×3): qty 2

## 2018-07-05 MED ORDER — UMECLIDINIUM-VILANTEROL 62.5-25 MCG/INH IN AEPB
1.0000 | INHALATION_SPRAY | Freq: Every day | RESPIRATORY_TRACT | Status: DC
  Administered 2018-07-05 – 2018-07-06 (×2): 1 via RESPIRATORY_TRACT
  Filled 2018-07-05 (×2): qty 14

## 2018-07-05 MED ORDER — PANTOPRAZOLE SODIUM 40 MG PO TBEC
40.0000 mg | DELAYED_RELEASE_TABLET | Freq: Every day | ORAL | Status: DC
  Administered 2018-07-05 – 2018-07-06 (×2): 40 mg via ORAL
  Filled 2018-07-05 (×2): qty 1

## 2018-07-05 MED ORDER — SODIUM CHLORIDE 0.9% FLUSH
3.0000 mL | Freq: Two times a day (BID) | INTRAVENOUS | Status: DC
  Administered 2018-07-05 (×2): 3 mL via INTRAVENOUS

## 2018-07-05 MED ORDER — SENNOSIDES-DOCUSATE SODIUM 8.6-50 MG PO TABS: 1.0000 | ORAL_TABLET | Freq: Every evening | ORAL | Status: DC | PRN

## 2018-07-05 NOTE — [place] FL2 (Signed)
[place] MEDICAID FL2 LEVEL OF CARE SCREENING TOOL     IDENTIFICATION  Patient Name: Vincent Mcclure Birthdate: 02/03/1932 Sex: male Admission Date (Current Location): 07/04/2018  [facility] and [place] Number:  Herbalist and Address:  [facility] [place], [place] [address], [place], [place] [address]      Provider Number: [id number]  Attending Physician Name and Address:  Edwin Dada, *  Relative Name and Phone Number:  Zyen Triggs; wife; [phone]    Current Level of Care: Hospital Recommended Level of Care: [place] Prior Approval Number:    Date Approved/Denied:   PASRR Number: [id number]  Discharge Plan: SNF    Current Diagnoses: Patient Active Problem List   Diagnosis Date Noted  . Cellulitis of left abdominal wall 07/05/2018  . Intraabdominal fluid collection 07/05/2018  . Peripheral edema   . Post-operative state   . Rupture of operation wound   . General weakness   . SOB (shortness of breath)   . CHF (congestive heart failure) ([place]) 06/06/2018  . Subacute confusional state 06/05/2018  . Hypoalbuminemia 06/03/2018  . Chronic diastolic CHF (congestive heart failure) ([place]) 06/02/2018  . Hypokalemia 06/02/2018  . Atrial fibrillation with RVR ([place]) 06/02/2018  . Cancer of ascending colon [id number] s/p right colectomy 05/19/2018 05/24/2018  . CAD (coronary artery disease), native coronary artery 03/14/2017  . Paroxysmal atrial fibrillation ([name])   . 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 s/p NSTEMI (non-ST elevated myocardial infarction); with subsequent urgent CABG  5/18 ([place]) 02/12/2017  . Chest pain 02/11/2017  . Overweight (BMI 25.0-29.9) 09/29/2016  . Alcohol use/ h/o heavy abuse 08/11/2016  . Left shoulder pain 01/24/2015  . h/o Anemia due to blood loss, acute 11/14/2014  . Eye pain   . Acute esophagitis   . Acute gastric ulcer   . Duodenal ulcer disease   . Upper GI bleed 11/13/2014  . Lumbar disc disease  01/30/2014  . Hearing loss 03/23/2013  . Screening for other and unspecified cardiovascular conditions 03/23/2013  . PAD (peripheral artery disease) ([place]) 10/14/2011  . Chronic low back pain 10/14/2011  . Glucose intolerance (impaired glucose tolerance) 10/12/2011  . Preventative health care 10/12/2011  . LEG CRAMPS 09/02/2010  . PERIPHERAL EDEMA 09/02/2010  . VARICOSE VEINS, LOWER EXTREMITIES 06/09/2009  . HLD (hyperlipidemia) 03/05/2008  . Anemia, iron deficiency 03/05/2008  . Hereditary and idiopathic peripheral neuropathy 03/05/2008  . HTN (hypertension) 03/05/2008  . GERD 03/05/2008  . IBS 03/05/2008  . Blood in stool 03/05/2008  . COLONIC POLYPS, HX OF 03/05/2008    Orientation RESPIRATION BLADDER Height & Weight     Self, Time, Situation, Place  O2(2L) Continent Weight:   Height:     BEHAVIORAL SYMPTOMS/MOOD NEUROLOGICAL BOWEL NUTRITION STATUS      Continent Diet(carb modified diet)  AMBULATORY STATUS COMMUNICATION OF NEEDS Skin   Limited Assist Verbally Surgical wounds(closed incision abdomen)                       Personal Care Assistance Level of Assistance  Bathing, Feeding, Dressing Bathing Assistance: Limited assistance Feeding assistance: Independent Dressing Assistance: Limited assistance     Functional Limitations Info  Sight, Hearing, Speech Sight Info: Adequate Hearing Info: Impaired Speech Info: Adequate    SPECIAL CARE FACTORS FREQUENCY  PT (By licensed PT), OT (By licensed OT)     PT Frequency: 5x week OT Frequency: 5x week  Contractures Contractures Info: Not present    Additional Factors Info  Code Status, Allergies Code Status Info: DNR Allergies Info: No Known Allergies           Current Medications (07/05/2018):  This is the current hospital active medication list Current Facility-Administered Medications  Medication Dose Route Frequency Provider Last Rate Last Dose  . 0.9 %  sodium chloride infusion  250 mL  Intravenous PRN Opyd, Ilene Qua, MD      . acetaminophen (TYLENOL) tablet 650 mg  650 mg Oral Q6H PRN Opyd, Ilene Qua, MD       Or  . acetaminophen (TYLENOL) suppository 650 mg  650 mg Rectal Q6H PRN Opyd, Ilene Qua, MD      . albuterol (PROVENTIL) (2.5 MG/3ML) 0.083% nebulizer solution 2.5 mg  2.5 mg Nebulization Q6H PRN Opyd, Ilene Qua, MD      . aspirin EC tablet 81 mg  81 mg Oral Daily Opyd, Ilene Qua, MD   81 mg at 07/05/18 0935  . bisacodyl (DULCOLAX) EC tablet 5 mg  5 mg Oral Daily PRN Opyd, Ilene Qua, MD      . cefTRIAXone (ROCEPHIN) 2 g in sodium chloride 0.9 % 100 mL IVPB  2 g Intravenous Q24H Opyd, Ilene Qua, MD   Stopped at 07/04/18 2236  . furosemide (LASIX) injection 40 mg  40 mg Intravenous Q12H Edwin Dada, MD   40 mg at 07/05/18 1217  . gabapentin (NEURONTIN) capsule 100 mg  100 mg Oral BID Opyd, Ilene Qua, MD   100 mg at 07/05/18 0935  . guaiFENesin (MUCINEX) 12 hr tablet 600 mg  600 mg Oral BID Opyd, Ilene Qua, MD   600 mg at 07/05/18 0935  . metoprolol succinate (TOPROL-XL) 24 hr tablet 50 mg  50 mg Oral Daily Opyd, Ilene Qua, MD   50 mg at 07/05/18 0935  . multivitamin with minerals tablet 1 tablet  1 tablet Oral Daily Opyd, Ilene Qua, MD   1 tablet at 07/05/18 0935  . ondansetron (ZOFRAN) tablet 4 mg  4 mg Oral Q6H PRN Opyd, Ilene Qua, MD       Or  . ondansetron (ZOFRAN) injection 4 mg  4 mg Intravenous Q6H PRN Opyd, Ilene Qua, MD      . oxyCODONE (Oxy IR/ROXICODONE) immediate release tablet 5 mg  5 mg Oral Q4H PRN Opyd, Ilene Qua, MD      . pantoprazole (PROTONIX) EC tablet 40 mg  40 mg Oral Daily Opyd, Ilene Qua, MD   40 mg at 07/05/18 0934  . potassium chloride SA (K-DUR,KLOR-CON) CR tablet 40 mEq  40 mEq Oral BID Edwin Dada, MD   40 mEq at 07/05/18 1217  . pravastatin (PRAVACHOL) tablet 40 mg  40 mg Oral q1800 Opyd, Ilene Qua, MD      . senna-docusate (Senokot-S) tablet 1 tablet  1 tablet Oral QHS PRN Opyd, Ilene Qua, MD      . sodium chloride  flush (NS) 0.9 % injection 3 mL  3 mL Intravenous Q12H Opyd, Ilene Qua, MD   3 mL at 07/05/18 0938  . sodium chloride flush (NS) 0.9 % injection 3 mL  3 mL Intravenous PRN Opyd, Ilene Qua, MD      . traMADol (ULTRAM) tablet 50 mg  50 mg Oral Q12H PRN Opyd, Ilene Qua, MD      . umeclidinium-vilanterol (ANORO ELLIPTA) 62.5-25 MCG/INH 1 puff  1 puff Inhalation Daily Opyd, Ilene Qua, MD   1  puff at 07/05/18 5146     Discharge Medications: Please see discharge summary for a list of discharge medications.  Relevant Imaging Results:  Relevant Lab Results:   Additional Information SS#: [id number]  [place], [place]

## 2018-07-05 NOTE — Progress Notes (Signed)
Patient wants to cancel DNR status.Patient now wants to be full code.Text paged Chaney Malling NP awaiting response.

## 2018-07-05 NOTE — Progress Notes (Signed)
Received patient from ED. Patient slide self from stretcher to bed.  No complaints of pain, A&O x4, productive cough. Will continue to monitor.

## 2018-07-05 NOTE — Progress Notes (Signed)
PROGRESS NOTE    Vincent Mcclure  ANV:[id number] DOB: [birth year] DOA: 07/04/2018 PCP: [name], DO      Brief Narrative:  Vincent Mcclure is a 84 y.o. M with CAD, PVD, anemia, AF not on AC, HTN, and recent colon cancer resection who presents with fever, possible swelling discomfort of left abdomen.  In ER, found to have temp 100F, tachycardia, hypoxia and bilateral effusions and elevated lactic acid.  Was given 3L IV fluids, empiric antibiotics for cellutlisi of abdomen wall and admitted.      Assessment & Plan:  Possible cellulitis This is an area on the left abdomen away from his drains, that is red and edematous.  Not a classical cellulitis.  Doubt worsening of intraabdominal infection (this was just imaged day of admission and was normal).  Acute on chronic diastolic CHF -Hold home Lasix -IV Lasix ordered -I/Os, daily weights, daily Cr  Paroxysmal atrial fibrillation CHA2DS2-Vasc 5.  Not on anticoagulation due to history of bleeding, reportedly. -Continue metoprolol  COPD No active disease -Continue home Anoro  Intraabdominal abscesses Colon cancer  Coronary disease secondary prevention Hypertension -Continue toprol -Continue aspirin, pravastatin  Other medcation -Continue PPI  Hypokalemia -Supplement K  Anemia, chronic, of chronic disease -Stable    DVT prophylaxis: SCds Code Status: DO NOT RESSUCITATE Family Communication: Wife at bedsdie MDM and disposition Plan: This is a no charge note.  For further details, please see H&P by my partner Dr. Myna Hidalgo from earlier today.  The below labs and imaging reports were reviewed and summarized above.    The patient was admitted with fever, found too have cellulitis and CHF.      Objective: Vitals:   07/05/18 0100 07/05/18 0143 07/05/18 0452 07/05/18 1300  BP:  113/79 111/69 120/72  Pulse:  93 91 82  Resp:  19 17 16   Temp: 98.3 F (36.8 C) 97.6 F (36.4 C) (!) 97.4 F (36.3 C) 98.5 F (36.9 C)    TempSrc: Oral Oral Oral Oral  SpO2:  93% 93% 95%    Intake/Output Summary (Last 24 hours) at 07/05/2018 1644 Last data filed at 07/05/2018 1500 Gross per 24 hour  Intake 340.21 ml  Output 1440 ml  Net -1099.79 ml   There were no vitals filed for this visit.  Examination: The patient was seen and examined.      Data Reviewed: I have personally reviewed following labs and imaging studies:  CBC: Recent Labs  Lab 07/04/18 2142 07/05/18 0449  WBC 8.1 6.5  NEUTROABS 5.7 4.2  HGB 9.3* 8.8*  HCT 30.4* 29.6*  MCV 87.6 88.9  PLT 269 599   Basic Metabolic Panel: Recent Labs  Lab 07/04/18 2142 07/05/18 0449  NA 136 138  K 3.4* 3.2*  CL 103 105  CO2 25 23  GLUCOSE 170* 111*  BUN 9 8  CREATININE 1.01 0.86  CALCIUM 7.9* 7.6*   GFR: Estimated Creatinine Clearance: 76.6 mL/min (by C-G formula based on SCr of 0.86 mg/dL). Liver Function Tests: Recent Labs  Lab 07/04/18 2142  AST 19  ALT 13  ALKPHOS 85  BILITOT 0.4  PROT 5.1*  ALBUMIN 2.3*   No results for input(s): LIPASE, AMYLASE in the last 168 hours. No results for input(s): AMMONIA in the last 168 hours. Coagulation Profile: Recent Labs  Lab 07/04/18 2142  INR 1.22   Cardiac Enzymes: Recent Labs  Lab 07/04/18 2142  TROPONINI <0.03   BNP (last 3 results) No results for input(s): PROBNP in the last  8760 hours. HbA1C: No results for input(s): HGBA1C in the last 72 hours. CBG: Recent Labs  Lab 07/05/18 0739  GLUCAP 96   Lipid Profile: No results for input(s): CHOL, HDL, LDLCALC, TRIG, CHOLHDL, LDLDIRECT in the last 72 hours. Thyroid Function Tests: No results for input(s): TSH, T4TOTAL, FREET4, T3FREE, THYROIDAB in the last 72 hours. Anemia Panel: No results for input(s): VITAMINB12, FOLATE, FERRITIN, TIBC, IRON, RETICCTPCT in the last 72 hours. Urine analysis:    Component Value Date/Time   COLORURINE YELLOW 07/04/2018 2253   APPEARANCEUR CLEAR 07/04/2018 2253   LABSPEC >1.046 (H)  07/04/2018 2253   PHURINE 6.0 07/04/2018 2253   GLUCOSEU NEGATIVE 07/04/2018 2253   GLUCOSEU NEGATIVE 08/11/2016 1521   HGBUR NEGATIVE 07/04/2018 2253   BILIRUBINUR NEGATIVE 07/04/2018 2253   KETONESUR NEGATIVE 07/04/2018 2253   PROTEINUR NEGATIVE 07/04/2018 2253   UROBILINOGEN 1.0 08/11/2016 1521   NITRITE NEGATIVE 07/04/2018 2253   LEUKOCYTESUR NEGATIVE 07/04/2018 2253   Sepsis Labs: @LABRCNTIP (procalcitonin:4,lacticacidven:4)  ) Recent Results (from the past 240 hour(s))  Blood Culture (routine x 2)     Status: None (Preliminary result)   Collection Time: 07/04/18  9:50 PM  Result Value Ref Range Status   Specimen Description BLOOD LEFT ARM  Final   Special Requests   Final    BOTTLES DRAWN AEROBIC AND ANAEROBIC Blood Culture adequate volume   Culture   Final    NO GROWTH < 24 HOURS Performed at [facility], [place] [address] [place], [place] [address]    Report Status PENDING  Incomplete  Blood Culture (routine x 2)     Status: None (Preliminary result)   Collection Time: 07/04/18  9:57 PM  Result Value Ref Range Status   Specimen Description BLOOD RIGHT ANTECUBITAL  Final   Special Requests   Final    BOTTLES DRAWN AEROBIC AND ANAEROBIC Blood Culture adequate volume   Culture   Final    NO GROWTH < 24 HOURS Performed at [facility], [place] [address], [place], [place] [address]    Report Status PENDING  Incomplete         Radiology Studies: Dg Chest 2 View  Result Date: 07/04/2018 CLINICAL DATA:  84 year old male with shortness of breath. EXAM: CHEST - 2 VIEW COMPARISON:  Chest radiograph dated 06/18/2018 FINDINGS: Small bilateral pleural effusions and associated atelectatic changes of the lung bases. Superimposed pneumonia is not excluded. Clinical correlation is recommended. No pneumothorax. Mild cardiomegaly. There is atherosclerotic calcification of the aortic arch. Median sternotomy wires and postsurgical changes of CABG. No acute osseous pathology.  IMPRESSION: Small bilateral pleural effusions and bibasilar atelectasis versus infiltrate. Electronically Signed   By: Anner Crete M.D.   On: 07/04/2018 22:52   Ct Abdomen Pelvis W Contrast  Result Date: 07/05/2018 CLINICAL DATA:  Right hemicolectomy 05/19/2018 for obstructing colon cancer, complicated by abscess requiring percutaneous drainage catheter placement x2. Left abdominal erythema and swelling. Fever. Cough. EXAM: CT ABDOMEN AND PELVIS WITH CONTRAST TECHNIQUE: Multidetector CT imaging of the abdomen and pelvis was performed using the standard protocol following bolus administration of intravenous contrast. CONTRAST:  149mL OMNIPAQUE IOHEXOL 300 MG/ML  SOLN COMPARISON:  07/04/2018 CT abdomen/pelvis. FINDINGS: Lower chest: Small to moderate dependent right pleural effusion, stable. Dependent bibasilar atelectasis. Lower sternotomy wires are intact. Coronary atherosclerosis status post CABG. Hepatobiliary: Normal liver size. No liver mass. Collapsed gallbladder with no radiopaque cholelithiasis. No biliary ductal dilatation. Pancreas: Normal, with no mass or duct dilation. Spleen: Normal size  spleen. Scattered punctate granulomatous splenic calcifications. No splenic mass. Adrenals/Urinary Tract: Left adrenal 2.3 cm nodule is stable since 2007 CT, compatible with a benign adenoma. No right adrenal nodule. No hydronephrosis. Small calyceal diverticulum in the upper right kidney. Simple 1.5 cm interpolar left renal cyst. Normal bladder containing excreted contrast. Stomach/Bowel: Normal non-distended stomach. Status post right hemicolectomy with ileocolic anastomosis in the right abdomen. No significant small bowel dilatation or definite small bowel wall thickening. Mild sigmoid diverticulosis. Stable mild wall thickening in the sigmoid colon adjacent to the right lower quadrant abscess. No new sites of wall thickening in the remnant large-bowel. Vascular/Lymphatic: Atherosclerotic nonaneurysmal  abdominal aorta. Patent portal, splenic, hepatic and renal veins. No pathologically enlarged lymph nodes in the abdomen or pelvis. Reproductive: Normal size prostate. Other: Anterior right upper quadrant percutaneous drain terminates in the anterior right perihepatic space. Minimal contrast is noted surrounding the pigtail tip in this location from the tube injection procedure performed earlier today, with no measurable collection in this location. Anterior right lower quadrant percutaneous drainage catheter terminates within right lower quadrant thick-walled collection measuring 5.3 x 4.6 cm, which is filled with injected contrast and minimal gas, unchanged in size from earlier today. No new focal fluid collections. Musculoskeletal: No aggressive appearing focal osseous lesions. Marked thoracolumbar spondylosis. IMPRESSION: 1. No change compared to the CT scan performed 9 hours earlier. 2. Anterior right perihepatic space collection is decompressed by indwelling percutaneous drain, with no residual measurable collection in this location. 3. Thick walled right lower quadrant collection with indwelling percutaneous drain is stable. No new focal fluid collections. 4. Stable postsurgical changes from right hemicolectomy with no evidence of bowel obstruction. Stable reactive wall thickening in the sigmoid colon adjacent to the right lower quadrant collection. 5. Stable small to moderate dependent right pleural effusion. 6. Stable left adrenal adenoma. 7.  Aortic Atherosclerosis (ICD10-I70.0). Electronically Signed   By: Ilona Sorrel M.D.   On: 07/05/2018 00:22   Ct Abdomen Pelvis W Contrast  Result Date: 07/04/2018 CLINICAL DATA:  Status post percutaneous catheter drainage right upper abdominal and pelvic abscesses after right hemicolectomy. EXAM: CT ABDOMEN AND PELVIS WITH CONTRAST TECHNIQUE: Multidetector CT imaging of the abdomen and pelvis was performed using the standard protocol following bolus administration  of intravenous contrast. CONTRAST:  180mL ISOVUE-300 IOPAMIDOL (ISOVUE-300) INJECTION 61% COMPARISON:  06/20/2018 FINDINGS: Lower chest: Slight increase in volume of right basilar pleural fluid. Hepatobiliary: No focal liver abnormality is seen. No gallstones, gallbladder wall thickening, or biliary dilatation. Pancreas: Unremarkable. No pancreatic ductal dilatation or surrounding inflammatory changes. Spleen: Normal in size without focal abnormality. Adrenals/Urinary Tract: Adrenal glands are unremarkable. Kidneys are normal, without renal calculi, focal lesion, or hydronephrosis. Bladder is unremarkable. Stomach/Bowel: No evidence of bowel obstruction or inflammation. No free intraperitoneal air. Vascular/Lymphatic: No significant vascular findings are present. No enlarged abdominal or pelvic lymph nodes. Reproductive: Prostate is unremarkable. Other: Further decompression of right anterior perihepatic abscess with resolution extraluminal air. There remains some fluid around the right pelvic drain measuring roughly 4.6 x 5.4 cm (previously 4.0 x 6.1 cm). No new fluid collections identified. Musculoskeletal: No acute or significant osseous findings. IMPRESSION: 1. Further decompression of right perihepatic abscess with resolution of extraluminal air. 2. Relatively stable volume of fluid surrounding the right pelvic drainage catheter. Electronically Signed   By: Aletta Edouard M.D.   On: 07/04/2018 16:44   Dg Sinus/fist Tube Chk-non Gi  Result Date: 07/04/2018 INDICATION: Status post percutaneous catheter drainage of right lower quadrant pelvic  and right upper quadrant perihepatic abscess collections after prior right hemicolectomy. EXAM: 1. INJECTION OF RIGHT PELVIC PERCUTANEOUS DRAINAGE CATHETER UNDER FLUOROSCOPY 2. INJECTION OF RIGHT UPPER QUADRANT PERCUTANEOUS DRAINAGE CATHETER UNDER FLUOROSCOPY MEDICATIONS: None ANESTHESIA/SEDATION: None CONTRAST:  Total of 60 mL Omnipaque 300 FLUOROSCOPY TIME:  2 minutes  and 6 seconds.  128.6 mGy. COMPLICATIONS: None immediate. PROCEDURE: Contrast was injected through the right pelvic drainage catheter followed by the right upper abdominal drainage catheter. Fluoroscopic spot images and cine loop sequences were saved. The right pelvic drainage catheter was connected to new suction bulb drainage. The right upper abdominal drainage catheter was connected to gravity bag drainage. FINDINGS: Injection of the right pelvic drainage catheter demonstrates irregular shaped abscess cavity. No demonstrated bowl fistula to adjacent bowel. Injection of the right upper abdominal perihepatic drainage catheter demonstrates irregular shaped abscess cavity without evidence of visible fistula to bowel. IMPRESSION: Fluoroscopic contrast injection of both the right pelvic and right upper abdominal drainage catheters demonstrate irregular abscess cavities without evidence of demonstrated fistula to bowel. Electronically Signed   By: Aletta Edouard M.D.   On: 07/04/2018 16:49   Ir Radiologist Eval & Mgmt  Result Date: 07/04/2018 Please refer to notes tab for details about interventional procedure. (Op Note)       Scheduled Meds: . aspirin EC  81 mg Oral Daily  . furosemide  40 mg Intravenous Q12H  . gabapentin  100 mg Oral BID  . guaiFENesin  600 mg Oral BID  . metoprolol succinate  50 mg Oral Daily  . multivitamin with minerals  1 tablet Oral Daily  . pantoprazole  40 mg Oral Daily  . potassium chloride  40 mEq Oral BID  . pravastatin  40 mg Oral q1800  . sodium chloride flush  3 mL Intravenous Q12H  . umeclidinium-vilanterol  1 puff Inhalation Daily   Continuous Infusions: . sodium chloride    . cefTRIAXone (ROCEPHIN)  IV Stopped (07/04/18 2236)     LOS: 0 days    Time spent: 24 minutes    Edwin Dada, MD [facility] 07/05/2018, 4:44 PM     Pager [phone] --- please page though AMION:  [email or web address] If 7PM-7AM, please contact  night-coverage

## 2018-07-05 NOTE — Plan of Care (Signed)

## 2018-07-05 NOTE — Patient Outreach (Signed)
[facility]) Care Management  07/05/2018  IRVINE GLORIOSO [birth year] [id number]   CSW was able to speak with SNF rep on 07/04/2018 who indicates no dc date or plans secured. Pt does plan to return home with wife once released from SNF. Pt still has drains.  Noted pt was admitted and is currently at  hospital (OBS).  CSW will follow for updates and furhter plans at SNF.   Eduard Clos, MSW, [place] Worker  [place] [phone]

## 2018-07-05 NOTE — H&P (Signed)
History and Physical    Vincent Mcclure MWN:[id number] DOB: 04-14-1932 DOA: 07/04/2018  PCP: [name], DO   Patient coming from: SNF   Chief Complaint: Fever  HPI: Vincent Mcclure is a 84 y.o. male with medical history significant for coronary artery disease, peripheral arterial disease, alcohol abuse in remission, chronic anemia, atrial fibrillation not anticoagulated due to history of GI bleed, hypertension, and colon cancer status post right hemicolectomy in August located by anastomotic leak and intra-abdominal abscesses that were treated with percutaneous drains earlier this month, now presenting from his SNF for evaluation of fevers.  Patient reports that he has been in his usual state of health and saw IR today as an outpatient with injection of his percutaneous drains under fluoroscopy negative for fistula to bowel.  Patient was noted to be febrile at the SNF and was sent to the ED for evaluation of this.  The patient did not appreciate any fever or chills, reports chronic unchanged cough and dyspnea, denies dysuria, and denies flank pain.  He has not noted any rash, but did appreciate some swelling and heat involving the left lateral nominal wall.  The site was noted to be red.  Denies diarrhea or vomiting.  ED Course: Upon arrival to the ED, patient is found to have a temp of 37.8, saturation in the upper 80s on room air, slightly tachypneic, tachycardic to 120, and with stable blood pressure.  EKG features a sinus rhythm with PVCs.  Chest x-ray is notable for small bilateral pleural effusions and bibasilar atelectasis versus infiltrate.  CT of the abdomen and pelvis is notable for percutaneous drain decompressing right perihepatic collection with no residual, as well as stable thick-walled right lower quadrant collection with percutaneous drain and no new collection nor acute findings.  Chemistry panel features a glucose of 170 and potassium 3.4.  CBC is notable for a stable chronic  normocytic anemia with hemoglobin of 9.3.  Troponin is undetectable and urinalysis notable for an elevated specific gravity.  Initial lactic acid was slightly elevated.  Blood and urine cultures were collected, 3 L of normal saline administered, and the patient was treated with Rocephin and clindamycin in the ED.  Lactic acid normalized, tachycardia resolved, and the patient will be observed for ongoing evaluation and management of abdominal wall cellulitis.  Review of Systems:  All other systems reviewed and apart from HPI, are negative.  Past Medical History:  Diagnosis Date  . Alcohol abuse   . Anemia   . Chronic low back pain 10/14/2011  . Colon polyps    hyperplastic (2004, 2010) and adenomatous (1990).    . COLONIC POLYPS, HX OF 03/05/2008  . Esophageal stricture    hx of  . Gastric ulcer   . GERD 03/05/2008  . GERD (gastroesophageal reflux disease) 1994   associated peptic strictures  . HYPERLIPIDEMIA 03/05/2008  . HYPERTENSION 03/05/2008  . Hypertension   . IBS 03/05/2008  . Impaired glucose tolerance 10/12/2011  . Iron deficiency anemia   . LEG CRAMPS 09/02/2010  . PAD (peripheral artery disease) ([place]) 10/14/2011  . PAD (peripheral artery disease) ([place]) 2013  . PERIPHERAL EDEMA 09/02/2010  . PERIPHERAL NEUROPATHY 03/05/2008  . Prostatitis    hx of  . VARICOSE VEINS, LOWER EXTREMITIES 06/09/2009    Past Surgical History:  Procedure Laterality Date  . CATARACT EXTRACTION  bilat  . COLECTOMY    . CORONARY ARTERY BYPASS GRAFT N/A 02/15/2017   Procedure: CORONARY ARTERY BYPASS GRAFTING times three  with left internal mammary harvest and endoscopic harvest of Right SVG. Grafts of LIMA to  LAD, SVG to Distal Circ, and to First Diag.;  Surgeon: Grace Isaac, MD;  Location: [facility];  Service: Open Heart Surgery;  Laterality: N/A;  . ESOPHAGOGASTRODUODENOSCOPY N/A 11/14/2014   Procedure: ESOPHAGOGASTRODUODENOSCOPY (EGD);  Surgeon: Jerene Bears, MD;  Location: [facility]COPY;  Service:  Endoscopy;  Laterality: N/A;  . IR RADIOLOGIST EVAL & MGMT  07/04/2018  . LAPAROTOMY N/A 05/19/2018   Procedure: EXPLORATORY LAPAROTOMY RIGHT COLECTOMY;  Surgeon: Fanny Skates, MD;  Location: [facility];  Service: General;  Laterality: N/A;  . LEFT HEART CATH AND CORONARY ANGIOGRAPHY N/A 02/14/2017   Procedure: Left Heart Cath and Coronary Angiography;  Surgeon: Belva Crome, MD;  Location: [facility];  Service: Cardiovascular;  Laterality: N/A;  . LUMBAR SPINE SURGERY  11/2008   Dr Joya Salm  . TEE WITHOUT CARDIOVERSION N/A 02/15/2017   Procedure: TRANSESOPHAGEAL ECHOCARDIOGRAM (TEE);  Surgeon: Grace Isaac, MD;  Location: [facility];  Service: Open Heart Surgery;  Laterality: N/A;     reports that he quit smoking about 9 years ago. His smoking use included cigarettes. He has a 62.50 pack-year smoking history. He has quit using smokeless tobacco.  His smokeless tobacco use included chew. He reports that he drinks about 6.0 standard drinks of alcohol per week. He reports that he does not use drugs.  Allergies  Allergen Reactions  . No Known Allergies     Family History  Problem Relation Age of Onset  . Cancer Mother        Brain Cancer  . Diabetes Father   . Heart disease Father        CAD  . Hypertension Father   . Stroke Father   . Diabetes Sister   . Stomach cancer Neg Hx   . Pancreatic cancer Neg Hx   . Colon cancer Neg Hx   . Esophageal cancer Neg Hx      Prior to Admission medications   Medication Sig Start Date End Date Taking? Authorizing Provider  acetaminophen (TYLENOL) 500 MG tablet Take 1 tablet (500 mg total) by mouth every 6 (six) hours as needed for mild pain. 05/30/18  Yes Rayburn, Floyce Stakes, PA-C  acetaminophen (TYLENOL) 650 MG CR tablet Take 650 mg by mouth every 6 (six) hours as needed for fever.   Yes [provider]  albuterol (PROVENTIL) (2.5 MG/3ML) 0.083% nebulizer solution Take 2.5 mg by nebulization 2 (two) times daily.   Yes [provider]  aspirin EC 81 MG EC tablet Take 1 tablet (81 mg total) by mouth daily. 02/25/17  Yes Conte, Tessa N, PA-C  ferrous sulfate 325 (65 FE) MG EC tablet Take 1 tablet (325 mg total) by mouth 3 (three) times daily with meals. Patient taking differently: Take 325 mg by mouth daily with breakfast.  01/17/18  Yes Opalski, Neoma Laming, DO  furosemide (LASIX) 40 MG tablet Take 1 tablet (40 mg total) by mouth daily. 08/10/17 08/10/18 Yes Dorothy Spark, MD  gabapentin (NEURONTIN) 100 MG capsule Take 1 capsule (100 mg total) by mouth 2 (two) times daily. 05/30/18  Yes Rayburn, Floyce Stakes, PA-C  guaiFENesin (MUCINEX) 600 MG 12 hr tablet Take 600 mg by mouth 2 (two) times daily.   Yes [provider]  metoprolol succinate (TOPROL XL) 50 MG 24 hr tablet Take 1 tablet (50 mg total) by mouth daily. Take with or immediately following a meal. 05/24/17  Yes Imogene Burn, PA-C  Multiple Vitamin (MULTIVITAMIN WITH MINERALS) TABS tablet Take 1 tablet by mouth daily.   Yes [provider]  pantoprazole (PROTONIX) 40 MG tablet Take 1 tablet (40 mg total) by mouth daily. 06/23/18  Yes Shelly Coss, MD  pneumococcal 13-valent conjugate vaccine (PREVNAR 13) SUSP injection Inject 0.5 mLs into the muscle once.   Yes [provider]  pravastatin (PRAVACHOL) 40 MG tablet I po q hs Patient taking differently: Take 40 mg by mouth at bedtime.  01/17/18  Yes Opalski, Neoma Laming, DO  traMADol (ULTRAM) 50 MG tablet Take 1 tablet (50 mg total) by mouth every 12 (twelve) hours as needed for moderate pain. 06/22/18  Yes Adhikari, Tamsen Meek, MD  umeclidinium-vilanterol (ANORO ELLIPTA) 62.5-25 MCG/INH AEPB Inhale 1 puff into the lungs daily.   Yes [provider]  vitamin C (ASCORBIC ACID) 500 MG tablet Take 500 mg by mouth 2 (two) times daily.   Yes [provider]    Physical Exam: Vitals:   07/04/18 2345 07/05/18 0057 07/05/18 0100 07/05/18 0143  BP: 135/80 117/70  113/79  Pulse: 98 96   93  Resp: (!) 23 (!) 24  19  Temp:   98.3 F (36.8 C) 97.6 F (36.4 C)  TempSrc:   Oral Oral  SpO2: 96% 99%  93%     Constitutional: NAD, calm  Eyes: PERTLA, lids and conjunctivae normal ENMT: Mucous membranes are moist. Posterior pharynx clear of any exudate or lesions.   Neck: normal, supple, no masses, no thyromegaly Respiratory: Breath sounds diminished bilaterally, no wheezing, no crackles. Normal respiratory effort.    Cardiovascular: S1 & S2 heard, regular rate and rhythm. 2+ edema to LE's. Abdomen: Soft, non-tender. Abdominal wall edema with erythema and heat at left flank; percutaneous drains in place without surrounding erythema or drainage. Bowel sounds active.  Musculoskeletal: no clubbing / cyanosis. No joint deformity upper and lower extremities.    Skin: Left lateral abd wall erythematous and hot. Warm, dry, well-perfused. Neurologic: No facial asymmetry. Gross hearing deficit. Sensation intact. Moving all extremities.  Psychiatric: Alert and oriented x 3. Pleasant and cooperative.    Labs on Admission: I have personally reviewed following labs and imaging studies  CBC: Recent Labs  Lab 07/04/18 2142  WBC 8.1  NEUTROABS 5.7  HGB 9.3*  HCT 30.4*  MCV 87.6  PLT 283   Basic Metabolic Panel: Recent Labs  Lab 07/04/18 2142  NA 136  K 3.4*  CL 103  CO2 25  GLUCOSE 170*  BUN 9  CREATININE 1.01  CALCIUM 7.9*   GFR: Estimated Creatinine Clearance: 65.2 mL/min (by C-G formula based on SCr of 1.01 mg/dL). Liver Function Tests: Recent Labs  Lab 07/04/18 2142  AST 19  ALT 13  ALKPHOS 85  BILITOT 0.4  PROT 5.1*  ALBUMIN 2.3*   No results for input(s): LIPASE, AMYLASE in the last 168 hours. No results for input(s): AMMONIA in the last 168 hours. Coagulation Profile: Recent Labs  Lab 07/04/18 2142  INR 1.22   Cardiac Enzymes: Recent Labs  Lab 07/04/18 2142  TROPONINI <0.03   BNP (last 3 results) No results for input(s): PROBNP in the last  8760 hours. HbA1C: No results for input(s): HGBA1C in the last 72 hours. CBG: No results for input(s): GLUCAP in the last 168 hours. Lipid Profile: No results for input(s): CHOL, HDL, LDLCALC, TRIG, CHOLHDL, LDLDIRECT in the last 72 hours. Thyroid Function Tests: No results for input(s): TSH, T4TOTAL, FREET4, T3FREE,  THYROIDAB in the last 72 hours. Anemia Panel: No results for input(s): VITAMINB12, FOLATE, FERRITIN, TIBC, IRON, RETICCTPCT in the last 72 hours. Urine analysis:    Component Value Date/Time   COLORURINE YELLOW 07/04/2018 2253   APPEARANCEUR CLEAR 07/04/2018 2253   LABSPEC >1.046 (H) 07/04/2018 2253   PHURINE 6.0 07/04/2018 2253   GLUCOSEU NEGATIVE 07/04/2018 2253   GLUCOSEU NEGATIVE 08/11/2016 1521   HGBUR NEGATIVE 07/04/2018 2253   BILIRUBINUR NEGATIVE 07/04/2018 2253   KETONESUR NEGATIVE 07/04/2018 2253   PROTEINUR NEGATIVE 07/04/2018 2253   UROBILINOGEN 1.0 08/11/2016 1521   NITRITE NEGATIVE 07/04/2018 2253   LEUKOCYTESUR NEGATIVE 07/04/2018 2253   Sepsis Labs: @LABRCNTIP (procalcitonin:4,lacticidven:4) )No results found for this or any previous visit (from the past 240 hour(s)).   Radiological Exams on Admission: Dg Chest 2 View  Result Date: 07/04/2018 CLINICAL DATA:  84 year old male with shortness of breath. EXAM: CHEST - 2 VIEW COMPARISON:  Chest radiograph dated 06/18/2018 FINDINGS: Small bilateral pleural effusions and associated atelectatic changes of the lung bases. Superimposed pneumonia is not excluded. Clinical correlation is recommended. No pneumothorax. Mild cardiomegaly. There is atherosclerotic calcification of the aortic arch. Median sternotomy wires and postsurgical changes of CABG. No acute osseous pathology. IMPRESSION: Small bilateral pleural effusions and bibasilar atelectasis versus infiltrate. Electronically Signed   By: Anner Crete M.D.   On: 07/04/2018 22:52   Ct Abdomen Pelvis W Contrast  Result Date: 07/05/2018 CLINICAL DATA:   Right hemicolectomy 05/19/2018 for obstructing colon cancer, complicated by abscess requiring percutaneous drainage catheter placement x2. Left abdominal erythema and swelling. Fever. Cough. EXAM: CT ABDOMEN AND PELVIS WITH CONTRAST TECHNIQUE: Multidetector CT imaging of the abdomen and pelvis was performed using the standard protocol following bolus administration of intravenous contrast. CONTRAST:  150mL OMNIPAQUE IOHEXOL 300 MG/ML  SOLN COMPARISON:  07/04/2018 CT abdomen/pelvis. FINDINGS: Lower chest: Small to moderate dependent right pleural effusion, stable. Dependent bibasilar atelectasis. Lower sternotomy wires are intact. Coronary atherosclerosis status post CABG. Hepatobiliary: Normal liver size. No liver mass. Collapsed gallbladder with no radiopaque cholelithiasis. No biliary ductal dilatation. Pancreas: Normal, with no mass or duct dilation. Spleen: Normal size spleen. Scattered punctate granulomatous splenic calcifications. No splenic mass. Adrenals/Urinary Tract: Left adrenal 2.3 cm nodule is stable since 2007 CT, compatible with a benign adenoma. No right adrenal nodule. No hydronephrosis. Small calyceal diverticulum in the upper right kidney. Simple 1.5 cm interpolar left renal cyst. Normal bladder containing excreted contrast. Stomach/Bowel: Normal non-distended stomach. Status post right hemicolectomy with ileocolic anastomosis in the right abdomen. No significant small bowel dilatation or definite small bowel wall thickening. Mild sigmoid diverticulosis. Stable mild wall thickening in the sigmoid colon adjacent to the right lower quadrant abscess. No new sites of wall thickening in the remnant large-bowel. Vascular/Lymphatic: Atherosclerotic nonaneurysmal abdominal aorta. Patent portal, splenic, hepatic and renal veins. No pathologically enlarged lymph nodes in the abdomen or pelvis. Reproductive: Normal size prostate. Other: Anterior right upper quadrant percutaneous drain terminates in the  anterior right perihepatic space. Minimal contrast is noted surrounding the pigtail tip in this location from the tube injection procedure performed earlier today, with no measurable collection in this location. Anterior right lower quadrant percutaneous drainage catheter terminates within right lower quadrant thick-walled collection measuring 5.3 x 4.6 cm, which is filled with injected contrast and minimal gas, unchanged in size from earlier today. No new focal fluid collections. Musculoskeletal: No aggressive appearing focal osseous lesions. Marked thoracolumbar spondylosis. IMPRESSION: 1. No change compared to the CT scan performed 9 hours earlier.  2. Anterior right perihepatic space collection is decompressed by indwelling percutaneous drain, with no residual measurable collection in this location. 3. Thick walled right lower quadrant collection with indwelling percutaneous drain is stable. No new focal fluid collections. 4. Stable postsurgical changes from right hemicolectomy with no evidence of bowel obstruction. Stable reactive wall thickening in the sigmoid colon adjacent to the right lower quadrant collection. 5. Stable small to moderate dependent right pleural effusion. 6. Stable left adrenal adenoma. 7.  Aortic Atherosclerosis (ICD10-I70.0). Electronically Signed   By: Ilona Sorrel M.D.   On: 07/05/2018 00:22   Ct Abdomen Pelvis W Contrast  Result Date: 07/04/2018 CLINICAL DATA:  Status post percutaneous catheter drainage right upper abdominal and pelvic abscesses after right hemicolectomy. EXAM: CT ABDOMEN AND PELVIS WITH CONTRAST TECHNIQUE: Multidetector CT imaging of the abdomen and pelvis was performed using the standard protocol following bolus administration of intravenous contrast. CONTRAST:  126mL ISOVUE-300 IOPAMIDOL (ISOVUE-300) INJECTION 61% COMPARISON:  06/20/2018 FINDINGS: Lower chest: Slight increase in volume of right basilar pleural fluid. Hepatobiliary: No focal liver abnormality is  seen. No gallstones, gallbladder wall thickening, or biliary dilatation. Pancreas: Unremarkable. No pancreatic ductal dilatation or surrounding inflammatory changes. Spleen: Normal in size without focal abnormality. Adrenals/Urinary Tract: Adrenal glands are unremarkable. Kidneys are normal, without renal calculi, focal lesion, or hydronephrosis. Bladder is unremarkable. Stomach/Bowel: No evidence of bowel obstruction or inflammation. No free intraperitoneal air. Vascular/Lymphatic: No significant vascular findings are present. No enlarged abdominal or pelvic lymph nodes. Reproductive: Prostate is unremarkable. Other: Further decompression of right anterior perihepatic abscess with resolution extraluminal air. There remains some fluid around the right pelvic drain measuring roughly 4.6 x 5.4 cm (previously 4.0 x 6.1 cm). No new fluid collections identified. Musculoskeletal: No acute or significant osseous findings. IMPRESSION: 1. Further decompression of right perihepatic abscess with resolution of extraluminal air. 2. Relatively stable volume of fluid surrounding the right pelvic drainage catheter. Electronically Signed   By: Aletta Edouard M.D.   On: 07/04/2018 16:44   Dg Sinus/fist Tube Chk-non Gi  Result Date: 07/04/2018 INDICATION: Status post percutaneous catheter drainage of right lower quadrant pelvic and right upper quadrant perihepatic abscess collections after prior right hemicolectomy. EXAM: 1. INJECTION OF RIGHT PELVIC PERCUTANEOUS DRAINAGE CATHETER UNDER FLUOROSCOPY 2. INJECTION OF RIGHT UPPER QUADRANT PERCUTANEOUS DRAINAGE CATHETER UNDER FLUOROSCOPY MEDICATIONS: None ANESTHESIA/SEDATION: None CONTRAST:  Total of 60 mL Omnipaque 300 FLUOROSCOPY TIME:  2 minutes and 6 seconds.  128.6 mGy. COMPLICATIONS: None immediate. PROCEDURE: Contrast was injected through the right pelvic drainage catheter followed by the right upper abdominal drainage catheter. Fluoroscopic spot images and cine loop sequences  were saved. The right pelvic drainage catheter was connected to new suction bulb drainage. The right upper abdominal drainage catheter was connected to gravity bag drainage. FINDINGS: Injection of the right pelvic drainage catheter demonstrates irregular shaped abscess cavity. No demonstrated bowl fistula to adjacent bowel. Injection of the right upper abdominal perihepatic drainage catheter demonstrates irregular shaped abscess cavity without evidence of visible fistula to bowel. IMPRESSION: Fluoroscopic contrast injection of both the right pelvic and right upper abdominal drainage catheters demonstrate irregular abscess cavities without evidence of demonstrated fistula to bowel. Electronically Signed   By: Aletta Edouard M.D.   On: 07/04/2018 16:49   Ir Radiologist Eval & Mgmt  Result Date: 07/04/2018 Please refer to notes tab for details about interventional procedure. (Op Note)   EKG: Independently reviewed. Sinus rhythm, PVC's.   Assessment/Plan  1. Cellulitis  - Presents from SNF for  evaluation of fever  - CXR with atelectasis vs infection in bases, also seen on CT abd/pelvis and appears more consistent with atelectasis  - UA not suggestive of infection  - CT abd/pelvis with no new fluid collection or acute process  - Erythema, edema, and heat noted to left lateral abdominal wall concerning for cellulitis  - Blood cultures collected in ED, 3 liters of NS bolused, and empiric antibiotics started with Rocephin and clindamycin  - Continue treatment with Rocephin, continue supportive care, follow cultures and clinical course    2. Intraabdominal abscesses  - Patient underwent right hemicolectomy in August, readmitted a month ago with abdominal pain and found to have anastomotic leak with intraabdominal collections that were treated with 2 percutaneous drains placed by IR  - CT abd/pelvis was repeated in ED on 9/24 with no residual collection in RUQ and stable RLQ thick-walled collection   -  He was seen by IR on 9/24 prior to ED visit with injection of both drains under fluoroscopy, findings negative for fistula to bowel  - Continue drain care, document outputs    3. Paroxysmal atrial fibrillation  - In sinus rhythm on admission  - CHADS-VASc 5 (age x2, CHF, CAD, HTN)  - Not anticoagulated d/t hx of GIB  - Continue metoprolol and ASA    4. COPD  - No dyspnea or wheezing  - Continue Anoro Ellipta and prn albuterol    5. Chronic diastolic CHF  - Appears hypervolemic with pitting edema in LE's and trunk, but crackles or edema on CXR  - He was given 3 liters NS in ED  - SLIV now, resume daily Lasix, follow daily wt and I/O's, continue beta-blocker    6. CAD; PAD  - No anginal complaints; no acute LE ischemia - Continue ASA, statin, and beta-blocker   7. Colon cancer  - Status-post right hemicolectomy in August  - Scheduled for oncology follow-up with Dr. [name]   8. Hypertension  - BP at goal, continue Toprol     DVT prophylaxis: SCD's  Code Status: DNR  Family Communication: Discussed with patient  Consults called: None Admission status: Observation     Vianne Bulls, MD [facility] Pager [phone]  If 7PM-7AM, please contact night-coverage [email or web address] Password [facility]  07/05/2018, 4:26 AM

## 2018-07-05 NOTE — Progress Notes (Signed)
Midline and drain dressings changed.

## 2018-07-05 NOTE — Social Work (Signed)
Pt from [facility], have requested new PT/OT evaluations for insurance purposes.  [place], [place] Work [phone]

## 2018-07-06 ENCOUNTER — Other Ambulatory Visit: Payer: Self-pay

## 2018-07-06 DIAGNOSIS — C182 Malignant neoplasm of ascending colon: Secondary | ICD-10-CM | POA: Diagnosis not present

## 2018-07-06 DIAGNOSIS — I482 Chronic atrial fibrillation: Secondary | ICD-10-CM | POA: Diagnosis not present

## 2018-07-06 DIAGNOSIS — I5033 Acute on chronic diastolic (congestive) heart failure: Secondary | ICD-10-CM | POA: Diagnosis not present

## 2018-07-06 DIAGNOSIS — L03311 Cellulitis of abdominal wall: Secondary | ICD-10-CM | POA: Diagnosis not present

## 2018-07-06 DIAGNOSIS — I739 Peripheral vascular disease, unspecified: Secondary | ICD-10-CM | POA: Diagnosis not present

## 2018-07-06 DIAGNOSIS — I480 Paroxysmal atrial fibrillation: Secondary | ICD-10-CM | POA: Diagnosis not present

## 2018-07-06 DIAGNOSIS — E785 Hyperlipidemia, unspecified: Secondary | ICD-10-CM | POA: Diagnosis not present

## 2018-07-06 DIAGNOSIS — R188 Other ascites: Secondary | ICD-10-CM | POA: Diagnosis not present

## 2018-07-06 DIAGNOSIS — D638 Anemia in other chronic diseases classified elsewhere: Secondary | ICD-10-CM | POA: Diagnosis not present

## 2018-07-06 DIAGNOSIS — J449 Chronic obstructive pulmonary disease, unspecified: Secondary | ICD-10-CM | POA: Diagnosis not present

## 2018-07-06 DIAGNOSIS — I10 Essential (primary) hypertension: Secondary | ICD-10-CM | POA: Diagnosis not present

## 2018-07-06 DIAGNOSIS — I5032 Chronic diastolic (congestive) heart failure: Secondary | ICD-10-CM | POA: Diagnosis not present

## 2018-07-06 DIAGNOSIS — D509 Iron deficiency anemia, unspecified: Secondary | ICD-10-CM | POA: Diagnosis not present

## 2018-07-06 DIAGNOSIS — F1021 Alcohol dependence, in remission: Secondary | ICD-10-CM | POA: Diagnosis not present

## 2018-07-06 DIAGNOSIS — I2510 Atherosclerotic heart disease of native coronary artery without angina pectoris: Secondary | ICD-10-CM | POA: Diagnosis not present

## 2018-07-06 DIAGNOSIS — D508 Other iron deficiency anemias: Secondary | ICD-10-CM

## 2018-07-06 DIAGNOSIS — C189 Malignant neoplasm of colon, unspecified: Secondary | ICD-10-CM | POA: Diagnosis not present

## 2018-07-06 DIAGNOSIS — D3502 Benign neoplasm of left adrenal gland: Secondary | ICD-10-CM | POA: Diagnosis not present

## 2018-07-06 DIAGNOSIS — I110 Hypertensive heart disease with heart failure: Secondary | ICD-10-CM | POA: Diagnosis not present

## 2018-07-06 LAB — CBC
HEMATOCRIT: 29.7 % — AB (ref 39.0–52.0)
HEMOGLOBIN: 9 g/dL — AB (ref 13.0–17.0)
MCH: 26.5 pg (ref 26.0–34.0)
MCHC: 30.3 g/dL (ref 30.0–36.0)
MCV: 87.4 fL (ref 78.0–100.0)
Platelets: 238 10*3/uL (ref 150–400)
RBC: 3.4 MIL/uL — AB (ref 4.22–5.81)
RDW: 16.1 % — AB (ref 11.5–15.5)
WBC: 5.8 10*3/uL (ref 4.0–10.5)

## 2018-07-06 LAB — URINE CULTURE: Culture: NO GROWTH

## 2018-07-06 LAB — BASIC METABOLIC PANEL
ANION GAP: 8 (ref 5–15)
BUN: 8 mg/dL (ref 8–23)
CHLORIDE: 103 mmol/L (ref 98–111)
CO2: 27 mmol/L (ref 22–32)
Calcium: 8.2 mg/dL — ABNORMAL LOW (ref 8.9–10.3)
Creatinine, Ser: 0.85 mg/dL (ref 0.61–1.24)
GFR calc non Af Amer: >60 mL/min (ref >60)
GLUCOSE: 85 mg/dL (ref 70–99)
POTASSIUM: 4.2 mmol/L (ref 3.5–5.1)
Sodium: 138 mmol/L (ref 135–145)

## 2018-07-06 LAB — GLUCOSE, CAPILLARY: Glucose-Capillary: 104 mg/dL — ABNORMAL HIGH (ref 70–99)

## 2018-07-06 MED ORDER — FUROSEMIDE 40 MG PO TABS: ORAL_TABLET | ORAL | 3 refills | Status: DC

## 2018-07-06 MED ORDER — CEPHALEXIN 500 MG PO CAPS: 500.0000 mg | ORAL_CAPSULE | Freq: Three times a day (TID) | ORAL | 0 refills | Status: AC

## 2018-07-06 NOTE — Evaluation (Signed)
Occupational Therapy Evaluation Patient Details Name: Vincent Mcclure MRN: [id number] DOB: 05-Dec-1931 Today's Date: 07/06/2018    History of Present Illness  Vincent Mcclure is a 84 y.o. male with medical history significant for coronary artery disease, peripheral arterial disease, alcohol abuse in remission, chronic anemia, atrial fibrillation not anticoagulated due to history of GI bleed, hypertension, and colon cancer status post right hemicolectomy in August located by anastomotic leak and intra-abdominal abscesses that were treated with percutaneous drains earlier this month, now presenting from his SNF for evaluation of fevers.   Clinical Impression   This 84 y/o male presents with the above. Prior to this admission pt was at [facility] facility ([facility]) receiving therapy services, reports he was completing functional mobility with RW and receiving some assist for ADLs. Pt demonstrating functional mobility this session using RW with minguard-minA; currently requires setup assist for seated UB ADL, min-modA for LB ADL. Pt appears to be moving well, mostly limited due to decreased activity tolerance, generalized weakness. Pt reports plans to return to SNF facility for continued therapy services at time of discharge; feel this recommendation is appropriate. Will continue to follow acutely to progress pt towards established OT goals.     Follow Up Recommendations  SNF;Supervision/Assistance - 24 hour    Equipment Recommendations  Other (comment)(TBD in next venue)           Precautions / Restrictions Precautions Precautions: Fall Precaution Comments: watch HR and sats; right sided drains Restrictions Weight Bearing Restrictions: No      Mobility Bed Mobility Overal bed mobility: Needs Assistance Bed Mobility: Supine to Sit       Sit to supine: HOB elevated;Min assist   General bed mobility comments: OOB in recliner upon arrival  Transfers Overall transfer level: Needs  assistance Equipment used: Rolling walker (2 wheeled) Transfers: Sit to/from Stand Sit to Stand: Min guard Stand pivot transfers: Min guard       General transfer comment: for balance/safety    Balance Overall balance assessment: Needs assistance Sitting-balance support: No upper extremity supported;Feet supported Sitting balance-Leahy Scale: Good     Standing balance support: Single extremity supported;No upper extremity supported;During functional activity Standing balance-Leahy Scale: Fair Standing balance comment: reliant on UEs for dynamic activities                           ADL either performed or assessed with clinical judgement   ADL Overall ADL's : Needs assistance/impaired Eating/Feeding: Independent;Sitting   Grooming: Min guard;Minimal assistance;Standing Grooming Details (indicate cue type and reason): for standing balance Upper Body Bathing: Min guard;Sitting   Lower Body Bathing: Minimal assistance;Sit to/from stand       Lower Body Dressing: Minimal assistance;Sit to/from stand Lower Body Dressing Details (indicate cue type and reason): pt reports he has been using AE at [facility] facility for completion of LB dressing ADLs Toilet Transfer: Min guard;Minimal assistance;Ambulation;Comfort height toilet;Grab bars;RW Armed forces technical officer Details (indicate cue type and reason): simulated in transfer to/from recliner Toileting- Clothing Manipulation and Hygiene: Minimal assistance;Sit to/from stand       Functional mobility during ADLs: Min guard;Minimal assistance General ADL Comments: pt with decreased activity tolerance, generalized weakness                          Pertinent Vitals/Pain Pain Assessment: Faces Pain Score: 6  Faces Pain Scale: Hurts a little bit Pain Location: abdomen Pain Descriptors / Indicators: Guarding;Sore  Pain Intervention(s): Monitored during session     Hand Dominance     Extremity/Trunk Assessment Upper  Extremity Assessment Upper Extremity Assessment: Overall WFL for tasks assessed   Lower Extremity Assessment Lower Extremity Assessment: Defer to PT evaluation       Communication Communication Communication: HOH   Cognition Arousal/Alertness: Awake/alert Behavior During Therapy: WFL for tasks assessed/performed Overall Cognitive Status: Within Functional Limits for tasks assessed                                     General Comments       Exercises Other Exercises Other Exercises: General LE therex in chair, LAQ, hip flexion, AP's   Shoulder Instructions      Home Living Family/patient expects to be discharged to:: Skilled nursing facility Living Arrangements: Other (Comment)   Type of Home: [place]: [place] - 2 wheels;Cane - single point          Prior Functioning/Environment          Comments: was at rehab [facility] participating in therapies        OT Problem List: Decreased strength;Impaired balance (sitting and/or standing);Decreased activity tolerance      OT Treatment/Interventions: Self-care/ADL training;DME and/or AE instruction;Therapeutic activities;Balance training;Therapeutic exercise;Patient/family education    OT Goals(Current goals can be found in the care plan section) Acute Rehab OT Goals Patient Stated Goal: get stronger OT Goal Formulation: With patient Time For Goal Achievement: 07/20/18 Potential to Achieve Goals: Good  OT Frequency: Min 2X/week   Barriers to D/C:            Co-evaluation              AM-PAC PT "6 Clicks" Daily Activity     Outcome Measure Help from another person eating meals?: None Help from another person taking care of personal grooming?: A Little Help from another person toileting, which includes using toliet, bedpan, or urinal?: A Little Help from another person bathing (including washing, rinsing, drying)?: A Little Help  from another person to put on and taking off regular upper body clothing?: None Help from another person to put on and taking off regular lower body clothing?: A Lot 6 Click Score: 19   End of Session Equipment Utilized During Treatment: Rolling walker Nurse Communication: Mobility status  Activity Tolerance: Patient tolerated treatment well Patient left: in chair;with call bell/phone within reach;with family/visitor present  OT Visit Diagnosis: Muscle weakness (generalized) (M62.81)                Time: 1324-4010 OT Time Calculation (min): 11 min Charges:  OT General Charges $OT Visit: 1 Visit OT Evaluation $OT Eval Moderate Complexity: 1 Mod   Lou Cal, OT [employer] Pager [phone] Office [phone]   Raymondo Band 07/06/2018, 2:48 PM

## 2018-07-06 NOTE — Clinical Social Work Placement (Signed)
   CLINICAL SOCIAL WORK PLACEMENT  NOTE [facility] [place]  Date:  07/06/2018  Patient Details  Name: Vincent Mcclure MRN: [id number] Date of Birth: 04-Sep-1932  Clinical Social Work is seeking post-discharge placement for this patient at the [place] level of care (*CSW will initial, date and re-position this form in  chart as items are completed):  Yes   Patient/family provided with [place] Work Department's list of facilities offering this level of care within the geographic area requested by the patient (or if unable, by the patient's family).  Yes   Patient/family informed of their freedom to choose among providers that offer the needed level of care, that participate in Medicare, Medicaid or managed care program needed by the patient, have an available bed and are willing to accept the patient.  Yes   Patient/family informed of [place]'s ownership interest in [facility] and [facility], as well as of the fact that they are under no obligation to receive care at these facilities.  PASRR submitted to EDS on       PASRR number received on 07/05/18     Existing PASRR number confirmed on       FL2 transmitted to all facilities in geographic area requested by pt/family on 07/05/18     FL2 transmitted to all facilities within larger geographic area on       Patient informed that his/her managed care company has contracts with or will negotiate with certain facilities, including the following:        Yes   Patient/family informed of bed offers received.  Patient chooses bed at [place], [place]     Physician recommends and patient chooses bed at      Patient to be transferred to [place] on 07/06/18.  Patient to be transferred to facility by [facility]     Patient family notified on 07/06/18 of transfer.  Name of family member notified:  pt wife at bedside     PHYSICIAN       Additional Comment:     _______________________________________________ Vincent Mcclure, [place] 07/06/2018, 2:58 PM

## 2018-07-06 NOTE — Care Management Note (Signed)
Case Management Note  Patient Details  Name: Vincent Mcclure MRN: [id number] Date of Birth: [birth year]  Subjective/Objective:                    Action/Plan:  Home health orders for HHPT/OT . Patient from [place]. Await PT/OT recommendations    Expected Discharge Date:  07/10/18               Expected Discharge Plan:     In-House Referral:  Clinical Social Work  Discharge planning Services  CM Consult  Post Acute Care Choice:  Durable Medical Equipment, Home Health Choice offered to:     DME Arranged:    DME Agency:     HH Arranged:    HH Agency:     Status of Service:  In process, will continue to follow  If discussed at Long Length of Stay Meetings, dates discussed:    Additional Comments:  [name], RN 07/06/2018, 8:30 AM

## 2018-07-06 NOTE — Social Work (Signed)
CSW received update from [employer], they have received insurance re-authorization for pt to return to [facility]. Have paged MD regarding medical stability for d/c.  [place], [place] Work [phone]

## 2018-07-06 NOTE — Discharge Summary (Signed)
Physician Discharge Summary  Vincent Mcclure DUK:[id number] DOB: Mar 01, 1932 DOA: 07/04/2018  PCP: [name], DO  Admit date: 07/04/2018 Discharge date: 07/06/2018  Admitted From: SNF  Disposition:  SNF   Recommendations for Outpatient Follow-up:  1. Please repeat BMP in 4 days (Monday 9/30) 2. Increase Lasix dose for 2 more days 3. Weigh daily; if weight >5 lbs over dry weight or if leg swelling, titrate up Lasix           Home Health: N/A  Equipment/Devices: TBD at SNF  Discharge Condition: Fair  CODE STATUS: FULL Diet recommendation: Cardiac  Brief/Interim Summary: Mr. Bingaman is an 84 year old man with CAD, PVD, anemia, AF not on AC, HTN, and recent colon cancer complicated by anastomotic leak and abscess, with recent percutaneous drains placed, which appear to be functioning well, who was admitted with fever, and discomfort of the left abdominal wall.  In the ER the patient was found to have a temperature 100 F, tachycardia, hypoxia, bilateral effusions, and elevated lactic acid.  He was given 3 L IV fluids, empiric antibiotics for cellulitis of the abdominal wall and admitted.     Discharge Diagnoses:   Abdominal wall cellulitis This is an area on the left abdomen away from his drains, that is red and edematous.  It is improving today, less painful.  Doubt worsening of intraabdominal infection (this was just imaged day of admission and was normal).  Treated with ceftriaxone x2, transitioned to Keflex to complete 10 days.   Acute on chronic diastolic CHF Prsented with effusions, elevated BNP, and chest congestion.  Given IV Lasix and edema resolved.  Discharged with increased dose Lasix for three more days.     Chronic atrial fibrillation CHA2DS2-Vasc 5.  Not on anticoagulation due to history of bleeding, reportedly.  Rates controlled.  COPD No active disease  Intraabdominal abscesses Colon cancer Imaging appeared stable.  Drains making mild  outpu.  Coronary disease secondary prevention Hypertension   Hypokalemia Resolved  Anemia, of chronic disease      Discharge Instructions  Discharge Instructions    Diet - low sodium heart healthy   Complete by:  As directed    Discharge instructions   Complete by:  As directed    From Dr. Loleta Books: You were seen in the hospital for fever, which appeared to be from a skin cellulitis on your left flank.  You were treated with IV antibiotics. You should complete 8 more days antibiotics:     Take cephalexin/Keflex 500 mg Three times daily for 8 more days, then stop   You were also given a lot of fluids in the ER, and were overloaded.  For the next 2 days:  Take Lasix 40 mg twice a day (at 8AM and 3PM)  Then starting Sunday, resume your normal dose of Lasix 40 mg once daily   Repeat a creatinine (basic metabolic panel) on Monday Measure weight daily, and call MD for adjustment of Lasix dose if leg swelling or if weight >5lbs over discharge weight   Increase activity slowly   Complete by:  As directed      Allergies as of 07/06/2018      Reactions   No Known Allergies       Medication List    TAKE these medications   acetaminophen 650 MG CR tablet Commonly known as:  TYLENOL Take 650 mg by mouth every 6 (six) hours as needed for fever.   acetaminophen 500 MG tablet Commonly known as:  TYLENOL  Take 1 tablet (500 mg total) by mouth every 6 (six) hours as needed for mild pain.   albuterol (2.5 MG/3ML) 0.083% nebulizer solution Commonly known as:  PROVENTIL Take 2.5 mg by nebulization 2 (two) times daily.   ANORO ELLIPTA 62.5-25 MCG/INH Aepb Generic drug:  umeclidinium-vilanterol Inhale 1 puff into the lungs daily.   aspirin 81 MG EC tablet Take 1 tablet (81 mg total) by mouth daily.   cephALEXin 500 MG capsule Commonly known as:  KEFLEX Take 1 capsule (500 mg total) by mouth 3 (three) times daily for 8 days.   ferrous sulfate 325 (65 FE) MG EC  tablet Take 1 tablet (325 mg total) by mouth 3 (three) times daily with meals. What changed:  when to take this   furosemide 40 MG tablet Commonly known as:  LASIX Take 40 mg (1 tab) twice daily for 2 days, then take 40 mg (1 tab) daily What changed:    how much to take  how to take this  when to take this  additional instructions   gabapentin 100 MG capsule Commonly known as:  NEURONTIN Take 1 capsule (100 mg total) by mouth 2 (two) times daily.   guaiFENesin 600 MG 12 hr tablet Commonly known as:  MUCINEX Take 600 mg by mouth 2 (two) times daily.   metoprolol succinate 50 MG 24 hr tablet Commonly known as:  TOPROL-XL Take 1 tablet (50 mg total) by mouth daily. Take with or immediately following a meal.   multivitamin with minerals Tabs tablet Take 1 tablet by mouth daily.   pantoprazole 40 MG tablet Commonly known as:  PROTONIX Take 1 tablet (40 mg total) by mouth daily.   pneumococcal 13-valent conjugate vaccine Susp injection Commonly known as:  PREVNAR 13 Inject 0.5 mLs into the muscle once.   pravastatin 40 MG tablet Commonly known as:  PRAVACHOL I po q hs What changed:    how much to take  how to take this  when to take this  additional instructions   traMADol 50 MG tablet Commonly known as:  ULTRAM Take 1 tablet (50 mg total) by mouth every 12 (twelve) hours as needed for moderate pain.   vitamin C 500 MG tablet Commonly known as:  ASCORBIC ACID Take 500 mg by mouth 2 (two) times daily.       Allergies  Allergen Reactions  . No Known Allergies     Consultations:  None   Procedures/Studies: Dg Chest 1 View  Result Date: 06/18/2018 CLINICAL DATA:  Hypoxia EXAM: CHEST  1 VIEW COMPARISON:  June 17, 2017 FINDINGS: No pneumothorax. Stable mild cardiomegaly. Small effusions with underlying atelectasis. No overt edema or other abnormality. IMPRESSION: Mild cardiomegaly with small pleural effusions and underlying atelectasis.  Electronically Signed   By: Dorise Bullion III M.D   On: 06/18/2018 07:17   Dg Chest 1 View  Result Date: 06/17/2018 CLINICAL DATA:  84 year old male status status post right-sided thoracentesis EXAM: CHEST  1 VIEW COMPARISON:  06/16/2018 FINDINGS: Cardiomediastinal silhouette unchanged in size and contour. Surgical changes of prior median sternotomy and CABG. Improved opacity at the right lung base, status post thoracentesis. No evidence of pneumothorax. Lucency associated with the right hemidiaphragm is favored to represent increasing subdiaphragmatic gas component within the abdomen, present on the most recent CT scan. Blunting of the left costophrenic angle. Patchy airspace opacities at the lung bases. IMPRESSION: Improved appearance of right-sided pleural fluid status post thoracentesis, with no complicating features. Trace left-sided pleural effusion. Electronically  Signed   By: Corrie Mckusick D.O.   On: 06/17/2018 09:24   Dg Chest 2 View  Result Date: 07/04/2018 CLINICAL DATA:  84 year old male status with shortness of breath. EXAM: CHEST - 2 VIEW COMPARISON:  Chest radiograph dated 06/18/2018 FINDINGS: Small bilateral pleural effusions and associated atelectatic changes of the lung bases. Superimposed pneumonia is not excluded. Clinical correlation is recommended. No pneumothorax. Mild cardiomegaly. There is atherosclerotic calcification of the aortic arch. Median sternotomy wires and postsurgical changes of CABG. No acute osseous pathology. IMPRESSION: Small bilateral pleural effusions and bibasilar atelectasis versus infiltrate. Electronically Signed   By: Anner Crete M.D.   On: 07/04/2018 22:52   Ct Chest W Contrast  Result Date: 06/12/2018 CLINICAL DATA:  RIGHT colectomy 05/19/2018 for colon adenocarcinoma. Suspicion pneumoperitoneum 1 chest radiograph EXAM: CT CHEST, ABDOMEN, AND PELVIS WITH CONTRAST TECHNIQUE: Multidetector CT imaging of the chest, abdomen and pelvis was performed following the  standard protocol during bolus administration of intravenous contrast. CONTRAST:  149mL OMNIPAQUE IOHEXOL 300 MG/ML  SOLN COMPARISON:  Radiograph 06/09/2018, CT 05/18/2018 FINDINGS: CT CHEST FINDINGS Cardiovascular: Post CABG anatomy. No large pulmonary emboli identified. No pericardial effusion. Mediastinum/Nodes: No axillary supraclavicular adenopathy. No mediastinal hilar adenopathy. Esophagus normal. Lungs/Pleura: Moderate sized RIGHT pleural effusion with basilar atelectasis. Mild LEFT basilar atelectasis. No pneumonia. No pneumothorax. No pulmonary edema. Musculoskeletal: Degenerate spurring of the spine. Midline sternotomy. CT ABDOMEN AND PELVIS FINDINGS Hepatobiliary: Extensive extraperitoneal free air position anterior to the liver and lateral to the liver. No focal hepatic lesion. Gallbladder collapsed. Portal veins patent. Pancreas: Pancreas is normal. No ductal dilatation. No pancreatic inflammation. Spleen: Normal spleen Adrenals/urinary tract: Stable nodule of the LEFT adrenal gland measures 2.2 cm. Previously characterized as adenoma. Stomach/Bowel: Stomach, duodenum, small-bowel appear normal. RIGHT hemicolectomy anatomy. No obstruction at the anastomosis. Oral contrast passes through the small bowel into the transverse colon through the anastomosis without gross evidence of leak. There is however large volume intraperitoneal free air in the RIGHT upper quadrant. This suggest perforated bowel or anastomotic breakdown. The transverse and descending colon are normal. Rectum appears normal. Within the central lower RIGHT abdomen large fluid collection measuring 14.5 by 11.6 cm. This collection is homogeneous low simple fluid attenuation cyst in centrally with a thin enhancing rim consistent with peritoneal abscess. (Image 101/2). The collection has a thin arm which extends to the LEFT pericolic gutter. No significant free fluid in the pelvis. Small amount free fluid anterior to the RIGHT hepatic lobe  also with a peritoneal enhancement (image 72/2). Vascular/Lymphatic: Abdominal aorta is normal caliber with atherosclerotic calcification. There is no retroperitoneal or periportal lymphadenopathy. No pelvic lymphadenopathy. Reproductive: Prostate normal Other: Large intraperitoneal free air in the RIGHT upper quadrant described in the stomach bowel section. Large peritoneal abscess as described in the bowel section. Musculoskeletal: No aggressive osseous lesion. IMPRESSION: 1. Evidence all bowel perforation/anastomosed breakdown with large volume intraperitoneal free air fluid in the RIGHT upper quadrant adjacent and above the liver. 2. Large loculated fluid collection in the RIGHT anterior pelvis extending along the LEFT pericolic gutter consists with PERITONEAL ABSCESS. 3. Of note, no large volume intraperitoneal free fluid. Oral contrast passed through the RIGHT colon anastomosis without evidence of leak. Findings could indicate a prior anastomotic breakdown which has healed in the interval with subsequent abscess formation from the prior leak / breakdown. Critical Value/emergent results were called by telephone at the time of interpretation on 06/12/2018 at 3:12 pm to Dr. Marylu Lund , who verbally acknowledged these results.  Electronically Signed   By: Suzy Bouchard M.D.   On: 06/12/2018 15:12   Ct Abdomen Pelvis W Contrast  Result Date: 07/05/2018 CLINICAL DATA:  Right hemicolectomy 05/19/2018 for obstructing colon cancer, complicated by abscess requiring percutaneous drainage catheter placement x2. Left abdominal erythema and swelling. Fever. Cough. EXAM: CT ABDOMEN AND PELVIS WITH CONTRAST TECHNIQUE: Multidetector CT imaging of the abdomen and pelvis was performed using the standard protocol following bolus administration of intravenous contrast. CONTRAST:  18mL OMNIPAQUE IOHEXOL 300 MG/ML  SOLN COMPARISON:  07/04/2018 CT abdomen/pelvis. FINDINGS: Lower chest: Small to moderate dependent right  pleural effusion, stable. Dependent bibasilar atelectasis. Lower sternotomy wires are intact. Coronary atherosclerosis status post CABG. Hepatobiliary: Normal liver size. No liver mass. Collapsed gallbladder with no radiopaque cholelithiasis. No biliary ductal dilatation. Pancreas: Normal, with no mass or duct dilation. Spleen: Normal size spleen. Scattered punctate granulomatous splenic calcifications. No splenic mass. Adrenals/Urinary Tract: Left adrenal 2.3 cm nodule is stable since 2007 CT, compatible with a benign adenoma. No right adrenal nodule. No hydronephrosis. Small calyceal diverticulum in the upper right kidney. Simple 1.5 cm interpolar left renal cyst. Normal bladder containing excreted contrast. Stomach/Bowel: Normal non-distended stomach. Status post right hemicolectomy with ileocolic anastomosis in the right abdomen. No significant small bowel dilatation or definite small bowel wall thickening. Mild sigmoid diverticulosis. Stable mild wall thickening in the sigmoid colon adjacent to the right lower quadrant abscess. No new sites of wall thickening in the remnant large-bowel. Vascular/Lymphatic: Atherosclerotic nonaneurysmal abdominal aorta. Patent portal, splenic, hepatic and renal veins. No pathologically enlarged lymph nodes in the abdomen or pelvis. Reproductive: Normal size prostate. Other: Anterior right upper quadrant percutaneous drain terminates in the anterior right perihepatic space. Minimal contrast is noted surrounding the pigtail tip in this location from the tube injection procedure performed earlier today, with no measurable collection in this location. Anterior right lower quadrant percutaneous drainage catheter terminates within right lower quadrant thick-walled collection measuring 5.3 x 4.6 cm, which is filled with injected contrast and minimal gas, unchanged in size from earlier today. No new focal fluid collections. Musculoskeletal: No aggressive appearing focal osseous lesions.  Marked thoracolumbar spondylosis. IMPRESSION: 1. No change compared to the CT scan performed 9 hours earlier. 2. Anterior right perihepatic space collection is decompressed by indwelling percutaneous drain, with no residual measurable collection in this location. 3. Thick walled right lower quadrant collection with indwelling percutaneous drain is stable. No new focal fluid collections. 4. Stable postsurgical changes from right hemicolectomy with no evidence of bowel obstruction. Stable reactive wall thickening in the sigmoid colon adjacent to the right lower quadrant collection. 5. Stable small to moderate dependent right pleural effusion. 6. Stable left adrenal adenoma. 7.  Aortic Atherosclerosis (ICD10-I70.0). Electronically Signed   By: Ilona Sorrel M.D.   On: 07/05/2018 00:22   Ct Abdomen Pelvis W Contrast  Result Date: 07/04/2018 CLINICAL DATA:  Status post percutaneous catheter drainage right upper abdominal and pelvic abscesses after right hemicolectomy. EXAM: CT ABDOMEN AND PELVIS WITH CONTRAST TECHNIQUE: Multidetector CT imaging of the abdomen and pelvis was performed using the standard protocol following bolus administration of intravenous contrast. CONTRAST:  180mL ISOVUE-300 IOPAMIDOL (ISOVUE-300) INJECTION 61% COMPARISON:  06/20/2018 FINDINGS: Lower chest: Slight increase in volume of right basilar pleural fluid. Hepatobiliary: No focal liver abnormality is seen. No gallstones, gallbladder wall thickening, or biliary dilatation. Pancreas: Unremarkable. No pancreatic ductal dilatation or surrounding inflammatory changes. Spleen: Normal in size without focal abnormality. Adrenals/Urinary Tract: Adrenal glands are unremarkable. Kidneys are normal,  without renal calculi, focal lesion, or hydronephrosis. Bladder is unremarkable. Stomach/Bowel: No evidence of bowel obstruction or inflammation. No free intraperitoneal air. Vascular/Lymphatic: No significant vascular findings are present. No enlarged  abdominal or pelvic lymph nodes. Reproductive: Prostate is unremarkable. Other: Further decompression of right anterior perihepatic abscess with resolution extraluminal air. There remains some fluid around the right pelvic drain measuring roughly 4.6 x 5.4 cm (previously 4.0 x 6.1 cm). No new fluid collections identified. Musculoskeletal: No acute or significant osseous findings. IMPRESSION: 1. Further decompression of right perihepatic abscess with resolution of extraluminal air. 2. Relatively stable volume of fluid surrounding the right pelvic drainage catheter. Electronically Signed   By: Aletta Edouard M.D.   On: 07/04/2018 16:44   Ct Abdomen Pelvis W Contrast  Result Date: 06/20/2018 CLINICAL DATA:  Anastomotic leak after right hemicolectomy and status post placement right pelvic percutaneous drainage catheter into large abscess on 06/13/2018 and additional right upper quadrant percutaneous drain in to large cavity predominantly containing air on 06/17/2018. The right pelvic drainage catheter shows diminished output clinically. The right upper quadrant drainage catheter continues to have feculent output. EXAM: CT ABDOMEN AND PELVIS WITH CONTRAST TECHNIQUE: Multidetector CT imaging of the abdomen and pelvis was performed using the standard protocol following bolus administration of intravenous contrast. CONTRAST:  197mL OMNIPAQUE IOHEXOL 300 MG/ML  SOLN COMPARISON:  06/16/2018 FINDINGS: Lower chest: There is a small right pleural effusion and a trace amount of left pleural fluid. Bibasilar atelectasis present. Hepatobiliary: No focal liver abnormality is seen. No gallstones, gallbladder wall thickening, or biliary dilatation. Pancreas: Unremarkable. No pancreatic ductal dilatation or surrounding inflammatory changes. Spleen: Normal in size.  Stable calcified granulomata. Adrenals/Urinary Tract: Adrenal glands are unremarkable. Kidneys are normal, without renal calculi, focal lesion, or hydronephrosis.  Bladder is unremarkable. Stomach/Bowel: No evidence of bowel obstruction. Vascular/Lymphatic: No significant vascular findings are present. No enlarged abdominal or pelvic lymph nodes. Reproductive: Prostate is unremarkable. Other: At the level of the indwelling right upper quadrant percutaneous drain, the collection of predominantly air as well as fluid present previously has been decompressed. There remains a tiny rim of fluid as well as small bubbles of extraluminal air that extend to abut the superior liver. At the level of the right pelvic drainage catheter, the residual component of abscess shows some decrease in size since the prior study on 09/06 with some fluid remaining around the pigtail portion of the drainage catheter. The area of fluid now measures roughly 4 x 6 cm compared to 5 x 8 cm previously. No new abscess identified. Musculoskeletal: No acute or significant osseous findings. IMPRESSION: 1. Decompression of right upper quadrant air and fluid collection after percutaneous catheter drainage. There remains a tiny rim of fluid as well as small extraluminal gas bubbles in this region suspicious for continued leak from bowel. 2. Further decrease in size of residual abscess fluid collection in the pelvis after percutaneous catheter drainage. There remains some fluid around the drainage catheter in the right pelvis. Electronically Signed   By: Aletta Edouard M.D.   On: 06/20/2018 17:26   Ct Abdomen Pelvis W Contrast  Result Date: 06/16/2018 CLINICAL DATA:  F/u abdominal drain placed 9/3. Pt with recent surgery, Fever post-op "Colon cancer, abdominal fluid collection EXAM: CT ABDOMEN AND PELVIS WITH CONTRAST TECHNIQUE: Multidetector CT imaging of the abdomen and pelvis was performed using the standard protocol following bolus administration of intravenous contrast. CONTRAST:  191mL OMNIPAQUE IOHEXOL 300 MG/ML  SOLN COMPARISON:  06/13/2013 FINDINGS: Lower chest: Moderate  right and trace left pleural  effusions as before. Atelectasis/consolidation posteriorly in the visualized lung bases. No pneumothorax. Previous median sternotomy and CABG. Hepatobiliary: No focal liver abnormality is seen. No gallstones, gallbladder wall thickening, or biliary dilatation. Pancreas: Unremarkable. No pancreatic ductal dilatation or surrounding inflammatory changes. Spleen: Punctate calcified granulomas.  Normal size and enhancement. Adrenals/Urinary Tract: Stable 2.2 cm left adrenal nodule. Right adrenal unremarkable. Stable left renal cyst. No hydronephrosis. Urinary bladder incompletely distended. Stomach/Bowel: Stomach is partially distended. Small bowel nondilated. Some circumferential wall thickening in nondilated small bowel loops in the right lateral abdomen below the gallbladder. Changes of right hemicolectomy. Residual colon is nondistended. Vascular/Lymphatic: Heavy aortoiliac arterial calcifications without aneurysm. Portal vein patent. No abdominal or pelvic adenopathy. Reproductive: Mild prostatic enlargement. Other: Stable right lower quadrant pigtail drain catheter. Interval decrease in size of residual collection measuring approximately 7.8 x 5.1 x3 cm (previously 14.7 x 11.6 x8.9). There has been interval increase in size of loculated gas and fluid collection, right subphrenic extending anteriorly in the right upper quadrant, measuring approximately 16.6 x 7.1 x17.7cm (previously 16.2 x 3.4 x 14.9cm). No new intra-abdominal collections. Musculoskeletal: Previous lower lumbar decompression surgery. Spondylitic changes in the visualized lower thoracic and lumbar spine. Negative for fracture or worrisome bone lesion. IMPRESSION: 1. Partial resolution of right lower quadrant collection post percutaneous drainage. The drain catheter is well positioned in the residual collection. 2. Enlarging right subphrenic and right upper quadrant gas and fluid collection. This would be approachable for percutaneous drainage if  needed. Electronically Signed   By: Lucrezia Europe M.D.   On: 06/16/2018 13:35   Ct Abdomen Pelvis W Contrast  Result Date: 06/12/2018 CLINICAL DATA:  RIGHT colectomy 05/19/2018 for colon adenocarcinoma. Suspicion pneumoperitoneum 1 chest radiograph EXAM: CT CHEST, ABDOMEN, AND PELVIS WITH CONTRAST TECHNIQUE: Multidetector CT imaging of the chest, abdomen and pelvis was performed following the standard protocol during bolus administration of intravenous contrast. CONTRAST:  145mL OMNIPAQUE IOHEXOL 300 MG/ML  SOLN COMPARISON:  Radiograph 06/09/2018, CT 05/18/2018 FINDINGS: CT CHEST FINDINGS Cardiovascular: Post CABG anatomy. No large pulmonary emboli identified. No pericardial effusion. Mediastinum/Nodes: No axillary supraclavicular adenopathy. No mediastinal hilar adenopathy. Esophagus normal. Lungs/Pleura: Moderate sized RIGHT pleural effusion with basilar atelectasis. Mild LEFT basilar atelectasis. No pneumonia. No pneumothorax. No pulmonary edema. Musculoskeletal: Degenerate spurring of the spine. Midline sternotomy. CT ABDOMEN AND PELVIS FINDINGS Hepatobiliary: Extensive extraperitoneal free air position anterior to the liver and lateral to the liver. No focal hepatic lesion. Gallbladder collapsed. Portal veins patent. Pancreas: Pancreas is normal. No ductal dilatation. No pancreatic inflammation. Spleen: Normal spleen Adrenals/urinary tract: Stable nodule of the LEFT adrenal gland measures 2.2 cm. Previously characterized as adenoma. Stomach/Bowel: Stomach, duodenum, small-bowel appear normal. RIGHT hemicolectomy anatomy. No obstruction at the anastomosis. Oral contrast passes through the small bowel into the transverse colon through the anastomosis without gross evidence of leak. There is however large volume intraperitoneal free air in the RIGHT upper quadrant. This suggest perforated bowel or anastomotic breakdown. The transverse and descending colon are normal. Rectum appears normal. Within the central lower  RIGHT abdomen large fluid collection measuring 14.5 by 11.6 cm. This collection is homogeneous low simple fluid attenuation cyst in centrally with a thin enhancing rim consistent with peritoneal abscess. (Image 101/2). The collection has a thin arm which extends to the LEFT pericolic gutter. No significant free fluid in the pelvis. Small amount free fluid anterior to the RIGHT hepatic lobe also with a peritoneal enhancement (image 72/2). Vascular/Lymphatic: Abdominal aorta is  normal caliber with atherosclerotic calcification. There is no retroperitoneal or periportal lymphadenopathy. No pelvic lymphadenopathy. Reproductive: Prostate normal Other: Large intraperitoneal free air in the RIGHT upper quadrant described in the stomach bowel section. Large peritoneal abscess as described in the bowel section. Musculoskeletal: No aggressive osseous lesion. IMPRESSION: 1. Evidence all bowel perforation/anastomosed breakdown with large volume intraperitoneal free air fluid in the RIGHT upper quadrant adjacent and above the liver. 2. Large loculated fluid collection in the RIGHT anterior pelvis extending along the LEFT pericolic gutter consists with PERITONEAL ABSCESS. 3. Of note, no large volume intraperitoneal free fluid. Oral contrast passed through the RIGHT colon anastomosis without evidence of leak. Findings could indicate a prior anastomotic breakdown which has healed in the interval with subsequent abscess formation from the prior leak / breakdown. Critical Value/emergent results were called by telephone at the time of interpretation on 06/12/2018 at 3:12 pm to Dr. Marylu Lund , who verbally acknowledged these results. Electronically Signed   By: Suzy Bouchard M.D.   On: 06/12/2018 15:12   Dg Sinus/fist Tube Chk-non Gi  Result Date: 07/04/2018 INDICATION: Status post percutaneous catheter drainage of right lower quadrant pelvic and right upper quadrant perihepatic abscess collections after prior right  hemicolectomy. EXAM: 1. INJECTION OF RIGHT PELVIC PERCUTANEOUS DRAINAGE CATHETER UNDER FLUOROSCOPY 2. INJECTION OF RIGHT UPPER QUADRANT PERCUTANEOUS DRAINAGE CATHETER UNDER FLUOROSCOPY MEDICATIONS: None ANESTHESIA/SEDATION: None CONTRAST:  Total of 60 mL Omnipaque 300 FLUOROSCOPY TIME:  2 minutes and 6 seconds.  128.6 mGy. COMPLICATIONS: None immediate. PROCEDURE: Contrast was injected through the right pelvic drainage catheter followed by the right upper abdominal drainage catheter. Fluoroscopic spot images and cine loop sequences were saved. The right pelvic drainage catheter was connected to new suction bulb drainage. The right upper abdominal drainage catheter was connected to gravity bag drainage. FINDINGS: Injection of the right pelvic drainage catheter demonstrates irregular shaped abscess cavity. No demonstrated bowl fistula to adjacent bowel. Injection of the right upper abdominal perihepatic drainage catheter demonstrates irregular shaped abscess cavity without evidence of visible fistula to bowel. IMPRESSION: Fluoroscopic contrast injection of both the right pelvic and right upper abdominal drainage catheters demonstrate irregular abscess cavities without evidence of demonstrated fistula to bowel. Electronically Signed   By: Aletta Edouard M.D.   On: 07/04/2018 16:49   Dg Chest Port 1 View  Result Date: 06/15/2018 CLINICAL DATA:  Dyspnea EXAM: PORTABLE CHEST 1 VIEW COMPARISON:  Chest x-rays dated 06/09/2018 and 05/22/2018. FINDINGS: Heart size and mediastinal contours are stable. Lungs are clear. No pleural effusion or pneumothorax seen. Diminished free intraperitoneal air under the RIGHT hemidiaphragm IMPRESSION: 1. No evidence of acute cardiopulmonary abnormality. No evidence of pneumonia or pulmonary edema. 2. Decreased free intraperitoneal air under the RIGHT hemidiaphragm. Electronically Signed   By: Franki Cabot M.D.   On: 06/15/2018 10:18   Dg Chest Port 1 View  Result Date:  06/09/2018 CLINICAL DATA:  Shortness of breath. EXAM: PORTABLE CHEST 1 VIEW COMPARISON:  Radiograph of June 06, 2018 and June 02, 2018. FINDINGS: Stable cardiomediastinal silhouette. Status post coronary bypass graft. Atherosclerosis of thoracic aorta is noted. No pneumothorax is noted. Lungs are clear. No significant pleural effusion is noted. There is recurrence of pneumoperitoneum underneath the right hemidiaphragm. Bony thorax is unremarkable. IMPRESSION: No acute cardiopulmonary abnormality seen. Recurrence of pneumoperitoneum is noted under right hemidiaphragm concerning for postoperative change or rupture of hollow viscus. These results will be called to the ordering clinician or representative by the Radiologist Assistant, and communication documented in the  PACS or zVision Dashboard. Aortic Atherosclerosis (ICD10-I70.0). Electronically Signed   By: Marijo Conception, M.D.   On: 06/09/2018 11:59   Dg Abd Acute W/chest  Result Date: 06/19/2018 CLINICAL DATA:  Abdominal pain EXAM: DG ABDOMEN ACUTE W/ 1V CHEST COMPARISON:  06/18/2018 FINDINGS: Cardiac shadow is mildly enlarged but stable. Postoperative changes are seen. Aortic calcifications are again identified. Small bilateral pleural effusions are noted. Right upper quadrant drainage catheter is again noted and stable. Scattered large and small bowel gas is seen. Right lower quadrant drainage catheter is also noted and stable. Degenerative changes of lumbar spine are seen. No free air is noted. No other focal abnormality is seen. IMPRESSION: Drainage catheters within the abdomen consistent with the given clinical history. Bilateral small effusions. Electronically Signed   By: Inez Catalina M.D.   On: 06/19/2018 11:18   Ct Image Guided Drainage By Percutaneous Catheter  Result Date: 06/17/2018 CLINICAL DATA:  Colon carcinoma, post partial colectomy. Enlarging right upper quadrant gas and fluid collection. EXAM: CT GUIDED DRAINAGE OF RIGHT UPPER  QUADRANT PERITONEAL ABSCESS ANESTHESIA/SEDATION: None required PROCEDURE: The procedure, risks, benefits, and alternatives were explained to the patient. Questions regarding the procedure were encouraged and answered. The patient understands and consents to the procedure. Select axial scans through the upper abdomen obtained. The collection was localized and an appropriate skin entry site was determined and marked. The operative field was prepped with chlorhexidinein a sterile fashion, and a sterile drape was applied covering the operative field. A sterile gown and sterile gloves were used for the procedure. Local anesthesia was provided with 1% Lidocaine. Under CT fluoroscopic guidance, a 19 gauge percutaneous entry needle was advanced to the collection. Gas could be aspirated. An Amplatz guidewire advanced easily, its position confirmed on CT. Tract dilated to facilitate placement of a 12 French pigtail drain catheter, directed cephalad towards the subphrenic component of the collection. A sample of the thin cloudy yellow aspirate was sent for Gram stain and culture. Catheter secured externally with 0 Prolene suture and StatLock and placed to gravity drain bag. The patient tolerated the procedure well. COMPLICATIONS: None immediate FINDINGS: Gas and fluid collection in the right upper anterior peritoneal cavity just inferior to the liver was again localized. 12 French pigtail drain catheter placed as above. Sample of the aspirate sent for Gram stain and culture. IMPRESSION: 1. Technically successful CT-guided right upper quadrant peritoneal abscess drain catheter placement. Electronically Signed   By: Lucrezia Europe M.D.   On: 06/17/2018 13:35   Ct Image Guided Drainage By Percutaneous Catheter  Result Date: 06/13/2018 INDICATION: 84 year old male status with a history of right lower quadrant fluid collection. He has been referred for drainage. EXAM: CT-GUIDED DRAINAGE OF RIGHT LOWER QUADRANT FLUID COLLECTION MEDICATIONS:  The patient is currently admitted to the hospital and receiving intravenous antibiotics. The antibiotics were administered within an appropriate time frame prior to the initiation of the procedure. ANESTHESIA/SEDATION: 2.0 mg IV Versed 100 mcg IV Fentanyl Moderate Sedation Time:  22 minutes The patient was continuously monitored during the procedure by the interventional radiology nurse under my direct supervision. COMPLICATIONS: None TECHNIQUE: Informed written consent was obtained from the patient after a thorough discussion of the procedural risks, benefits and alternatives. All questions were addressed. Maximal Sterile Barrier Technique was utilized including caps, mask, sterile gowns, sterile gloves, sterile drape, hand hygiene and skin antiseptic. A timeout was performed prior to the initiation of the procedure. PROCEDURE: The right lower quadrant was prepped with chlorhexidine in a  sterile fashion, and a sterile drape was applied covering the operative field. A sterile gown and sterile gloves were used for the procedure. Local anesthesia was provided with 1% Lidocaine. Once the patient is prepped and draped in the usual sterile fashion, 1% lidocaine was used for local anesthesia. Using CT guidance, trocar needle was advanced into the fluid collection of the right lower quadrant. Modified Seldinger technique was used to place a 12 [place] drain. Drain was sutured in position. Approximately 450 cc of murky yellow fluid aspirated sample was sent for culture. Drain was attached to gravity drainage. Patient tolerated the procedure well and remained hemodynamically stable throughout. No complications were encountered and no significant blood loss. FINDINGS: CT again demonstrates fluid collection the right lower quadrant. Status post drain placement, there is significant reduction in fluid after aspiration of 450 cc of fluid. IMPRESSION: Status post drain placement of right lower quadrant fluid collection. Signed,  Dulcy Fanny. Dellia Nims, RPVI [facility] Radiology Electronically Signed   By: Corrie Mckusick D.O.   On: 06/13/2018 17:17   Ir Radiologist Eval & Mgmt  Result Date: 07/04/2018 Please refer to notes tab for details about interventional procedure. (Op Note)  US Thoracentesis Asp Pleural Space W/img Guide  Result Date: 06/17/2018 INDICATION: Since surgery for colon cancer. Right pleural effusion. Request for diagnostic and therapeutic thoracentesis. EXAM: ULTRASOUND GUIDED RIGHT THORACENTESIS MEDICATIONS: 1% lidocaine 10 mL COMPLICATIONS: None immediate. PROCEDURE: An ultrasound guided thoracentesis was thoroughly discussed with the patient and questions answered. The benefits, risks, alternatives and complications were also discussed. The patient understands and wishes to proceed with the procedure. Written consent was obtained. Ultrasound was performed to localize and mark an adequate pocket of fluid in the right chest. The area was then prepped and draped in the normal sterile fashion. 1% Lidocaine was used for local anesthesia. Under ultrasound guidance a 6 Fr Safe-T-Centesis catheter was introduced. Thoracentesis was performed. The catheter was removed and a dressing applied. FINDINGS: A total of approximately 1 L of clear yellow fluid was removed. Samples were sent to the laboratory as requested by the clinical team. IMPRESSION: Successful ultrasound guided right thoracentesis yielding 1 L of pleural fluid. No pneumothorax on post-procedure chest x-ray. Read by: Gareth Eagle, PA-C Electronically Signed   By: Corrie Mckusick D.O.   On: 06/17/2018 10:13       Subjective: Feeling much better.  Still some edema in left abdominal wall.  No fever, no cough, no abdominal pain.  No leg swelling or orthopnea, no cough, sputum.  Discharge Exam: Vitals:   07/06/18 0714 07/06/18 0851  BP: 117/71   Pulse: 86   Resp: (!) 22   Temp: (!) 97.5 F (36.4 C)   SpO2:  95% 96%   Vitals:   07/06/18 0404 07/06/18 0605 07/06/18 0714 07/06/18 0851  BP: 104/66  117/71   Pulse: 89  86   Resp: 20 20 (!) 22   Temp: 97.9 F (36.6 C)  (!) 97.5 F (36.4 C)   TempSrc: Oral  Oral   SpO2: 93%  95% 96%  Weight:  96.7 kg    Height:  5\' 11"  (1.803 m)      General: Pt is alert, awake, not in acute distress, sittnig in recliner watching DNI director testimony Cardiovascular: RRR, S1/S2 +, no rubs, no gallops Respiratory: CTA bilaterally, no wheezing, no rhonchi Abdominal: Soft, NT, ND, bowel sounds +, right sided drains are in place, no redness or swelling or increase  in drainage from or around drains; the left flank has mild redness and pitting edema Extremities: no lower extremity edema at all, no cyanosis    The results of significant diagnostics from this hospitalization (including imaging, microbiology, ancillary and laboratory) are listed below for reference.     Microbiology: Recent Results (from the past 240 hour(s))  Blood Culture (routine x 2)     Status: None (Preliminary result)   Collection Time: 07/04/18  9:50 PM  Result Value Ref Range Status   Specimen Description BLOOD LEFT ARM  Final   Special Requests   Final    BOTTLES DRAWN AEROBIC AND ANAEROBIC Blood Culture adequate volume   Culture   Final    NO GROWTH 2 DAYS Performed at [facility], [address] [place], [place] [address]    Report Status PENDING  Incomplete  Blood Culture (routine x 2)     Status: None (Preliminary result)   Collection Time: 07/04/18  9:57 PM  Result Value Ref Range Status   Specimen Description BLOOD RIGHT ANTECUBITAL  Final   Special Requests   Final    BOTTLES DRAWN AEROBIC AND ANAEROBIC Blood Culture adequate volume   Culture   Final    NO GROWTH 2 DAYS Performed at [facility], [place] [address], [place], [place] [address]    Report Status PENDING  Incomplete  Urine culture     Status: None   Collection Time: 07/04/18 10:56 PM  Result  Value Ref Range Status   Specimen Description URINE, RANDOM  Final   Special Requests NONE  Final   Culture   Final    NO GROWTH Performed at [facility], [place] [address], [place],  [address]    Report Status 07/06/2018 FINAL  Final     Labs: BNP (last 3 results) Recent Labs    06/02/18 1801 07/05/18 1207  BNP 165.5* 097.3*   Basic Metabolic Panel: Recent Labs  Lab 07/04/18 2142 07/05/18 0449 07/06/18 0447  NA 136 138 138  K 3.4* 3.2* 4.2  CL 103 105 103  CO2 25 23 27   GLUCOSE 170* 111* 85  BUN 9 8 8   CREATININE 1.01 0.86 0.85  CALCIUM 7.9* 7.6* 8.2*   Liver Function Tests: Recent Labs  Lab 07/04/18 2142  AST 19  ALT 13  ALKPHOS 85  BILITOT 0.4  PROT 5.1*  ALBUMIN 2.3*   No results for input(s): LIPASE, AMYLASE in the last 168 hours. No results for input(s): AMMONIA in the last 168 hours. CBC: Recent Labs  Lab 07/04/18 2142 07/05/18 0449 07/06/18 0447  WBC 8.1 6.5 5.8  NEUTROABS 5.7 4.2  --   HGB 9.3* 8.8* 9.0*  HCT 30.4* 29.6* 29.7*  MCV 87.6 88.9 87.4  PLT 269 239 238   Cardiac Enzymes: Recent Labs  Lab 07/04/18 2142  TROPONINI <0.03   BNP: Invalid input(s): POCBNP CBG: Recent Labs  Lab 07/05/18 0739 07/06/18 0802  GLUCAP 96 104*   D-Dimer No results for input(s): DDIMER in the last 72 hours. Hgb A1c No results for input(s): HGBA1C in the last 72 hours. Lipid Profile No results for input(s): CHOL, HDL, LDLCALC, TRIG, CHOLHDL, LDLDIRECT in the last 72 hours. Thyroid function studies No results for input(s): TSH, T4TOTAL, T3FREE, THYROIDAB in the last 72 hours.  Invalid input(s): FREET3 Anemia work up No results for input(s): VITAMINB12, FOLATE, FERRITIN, TIBC, IRON, RETICCTPCT in the last 72 hours. Urinalysis    Component Value Date/Time  COLORURINE YELLOW 07/04/2018 2253   APPEARANCEUR CLEAR 07/04/2018 2253   LABSPEC >1.046 (H) 07/04/2018 2253   PHURINE 6.0 07/04/2018 2253   GLUCOSEU NEGATIVE 07/04/2018 2253    GLUCOSEU NEGATIVE 08/11/2016 1521   HGBUR NEGATIVE 07/04/2018 2253   BILIRUBINUR NEGATIVE 07/04/2018 2253   KETONESUR NEGATIVE 07/04/2018 2253   PROTEINUR NEGATIVE 07/04/2018 2253   UROBILINOGEN 1.0 08/11/2016 1521   NITRITE NEGATIVE 07/04/2018 2253   LEUKOCYTESUR NEGATIVE 07/04/2018 2253   Sepsis Labs Invalid input(s): PROCALCITONIN,  WBC,  LACTICIDVEN Microbiology Recent Results (from the past 240 hour(s))  Blood Culture (routine x 2)     Status: None (Preliminary result)   Collection Time: 07/04/18  9:50 PM  Result Value Ref Range Status   Specimen Description BLOOD LEFT ARM  Final   Special Requests   Final    BOTTLES DRAWN AEROBIC AND ANAEROBIC Blood Culture adequate volume   Culture   Final    NO GROWTH 2 DAYS Performed at [facility], [place] [address], [place], [place] [address]    Report Status PENDING  Incomplete  Blood Culture (routine x 2)     Status: None (Preliminary result)   Collection Time: 07/04/18  9:57 PM  Result Value Ref Range Status   Specimen Description BLOOD RIGHT ANTECUBITAL  Final   Special Requests   Final    BOTTLES DRAWN AEROBIC AND ANAEROBIC Blood Culture adequate volume   Culture   Final    NO GROWTH 2 DAYS Performed at [facility], [place] [address] [place], [place] [address]    Report Status PENDING  Incomplete  Urine culture     Status: None   Collection Time: 07/04/18 10:56 PM  Result Value Ref Range Status   Specimen Description URINE, RANDOM  Final   Special Requests NONE  Final   Culture   Final    NO GROWTH Performed at [facility], [place] [address], [place], [place] [address]    Report Status 07/06/2018 FINAL  Final     Time coordinating discharge: 25 minutes       SIGNED:   Edwin Dada, MD  [facility] 07/06/2018, 12:12 PM

## 2018-07-06 NOTE — Social Work (Signed)
Clinical Social Worker facilitated patient discharge including contacting patient family and facility to confirm patient discharge plans.  Clinical information faxed to facility and family agreeable with plan.  CSW arranged ambulance transport via [facility] to [place] RN to call [phone] with report prior to discharge.  Clinical Social Worker will sign off for now as social work intervention is no longer needed. Please consult [place] again if new need arises.  Alexander Mt, [place] Social Worker

## 2018-07-06 NOTE — Progress Notes (Signed)
Discharged to [facility] via ambulance. Report given to [facility].

## 2018-07-06 NOTE — Evaluation (Signed)
Physical Therapy Evaluation Patient Details Name: Vincent Mcclure MRN: [id number] DOB: 1931-12-01 Today's Date: 07/06/2018   History of Present Illness   Vincent Mcclure is a 84 y.o. male with medical history significant for coronary artery disease, peripheral arterial disease, alcohol abuse in remission, chronic anemia, atrial fibrillation not anticoagulated due to history of GI bleed, hypertension, and colon cancer status post right hemicolectomy in August located by anastomotic leak and intra-abdominal abscesses that were treated with percutaneous drains earlier this month, now presenting from his SNF for evaluation of fevers.  Clinical Impression  Patient presents with decreased independence with mobility ongoing since colectomy and reports feeling little better this session over last ambulation attempt.  Feel he remains appropriate to return to SNF to work on decreased strength, endurance, safety and decreased activity tolerance.  Will follow until d/c.    Follow Up Recommendations SNF;Supervision/Assistance - 24 hour    Equipment Recommendations  None recommended by PT    Recommendations for Other Services       Precautions / Restrictions Precautions Precautions: Fall Precaution Comments: watch HR and sats; right sided drains      Mobility  Bed Mobility Overal bed mobility: Needs Assistance Bed Mobility: Supine to Sit       Sit to supine: HOB elevated;Min assist   General bed mobility comments: for trunk  Transfers Overall transfer level: Needs assistance Equipment used: Rolling walker (2 wheeled) Transfers: Sit to/from Stand   Stand pivot transfers: Min guard       General transfer comment: for balance/safety  Ambulation/Gait Ambulation/Gait assistance: Min Web designer (Feet): 75 Feet Assistive device: Rolling walker (2 wheeled) Gait Pattern/deviations: Step-through pattern;Decreased stride length     General Gait Details: stopped to stand and rest  x 1 with HR 120, SpO2 91%  Stairs            Wheelchair Mobility    Modified Rankin (Stroke Patients Only)       Balance Overall balance assessment: Needs assistance Sitting-balance support: No upper extremity supported;Feet supported Sitting balance-Leahy Scale: Good     Standing balance support: Bilateral upper extremity supported;During functional activity Standing balance-Leahy Scale: Fair Standing balance comment: reliant on UEs for dynamic activities                             Pertinent Vitals/Pain Pain Score: 6  Pain Location: abdomen and L neck Pain Descriptors / Indicators: Sharp;Sore Pain Intervention(s): Repositioned;Monitored during session;Heat applied    Home Living Family/patient expects to be discharged to:: [place]: Other (Comment)   Type of Home: [place]: [place] - 2 wheels;Cane - single point      Prior Function           Comments: was at rehab [facility] participating in PT      Hand Dominance        Extremity/Trunk Assessment   Upper Extremity Assessment Upper Extremity Assessment: Overall WFL for tasks assessed    Lower Extremity Assessment Lower Extremity Assessment: Generalized weakness       Communication   Communication: HOH  Cognition Arousal/Alertness: Awake/alert Behavior During Therapy: WFL for tasks assessed/performed Overall Cognitive Status: Within Functional Limits for tasks assessed  General Comments      Exercises Other Exercises Other Exercises: General LE therex in chair, LAQ, hip flexion, AP's   Assessment/Plan    PT Assessment Patient needs continued PT services  PT Problem List Decreased strength;Decreased mobility;Decreased balance;Decreased activity tolerance;Decreased knowledge of use of DME;Pain       PT Treatment Interventions DME  instruction;Therapeutic activities;Gait training;Therapeutic exercise;Patient/family education;Functional mobility training;Balance training    PT Goals (Current goals can be found in the Care Plan section)  Acute Rehab PT Goals Patient Stated Goal: get stronger PT Goal Formulation: With patient Time For Goal Achievement: 07/20/18 Potential to Achieve Goals: Good    Frequency Min 2X/week   Barriers to discharge        Co-evaluation               AM-PAC PT "6 Clicks" Daily Activity  Outcome Measure Difficulty turning over in bed (including adjusting bedclothes, sheets and blankets)?: A Little Difficulty moving from lying on back to sitting on the side of the bed? : Unable Difficulty sitting down on and standing up from a chair with arms (e.g., wheelchair, bedside commode, etc,.)?: Unable Help needed moving to and from a bed to chair (including a wheelchair)?: A Little Help needed walking in hospital room?: A Little Help needed climbing 3-5 steps with a railing? : A Lot 6 Click Score: 13    End of Session Equipment Utilized During Treatment: Gait belt Activity Tolerance: Patient limited by fatigue Patient left: with call bell/phone within reach;in chair;with chair alarm set   PT Visit Diagnosis: Other abnormalities of gait and mobility (R26.89);Muscle weakness (generalized) (M62.81)    Time: 0034-9611 PT Time Calculation (min) (ACUTE ONLY): 28 min   Charges:   PT Evaluation $PT Eval Moderate Complexity: 1 Mod PT Treatments $Gait Training: 8-22 mins        Magda Kiel, [place] [facility] [phone] 07/06/2018   Reginia Naas 07/06/2018, 12:43 PM

## 2018-07-06 NOTE — Progress Notes (Signed)
Report taken by Estill Bamberg from [facility]

## 2018-07-09 LAB — CULTURE, BLOOD (ROUTINE X 2)
CULTURE: NO GROWTH
Culture: NO GROWTH
Special Requests: ADEQUATE
Special Requests: ADEQUATE

## 2018-07-10 ENCOUNTER — Ambulatory Visit: Payer: Self-pay | Admitting: *Deleted

## 2018-07-10 ENCOUNTER — Other Ambulatory Visit: Payer: Self-pay | Admitting: *Deleted

## 2018-07-11 ENCOUNTER — Other Ambulatory Visit: Payer: Self-pay | Admitting: *Deleted

## 2018-07-11 DIAGNOSIS — I5033 Acute on chronic diastolic (congestive) heart failure: Secondary | ICD-10-CM | POA: Diagnosis not present

## 2018-07-11 DIAGNOSIS — L03311 Cellulitis of abdominal wall: Secondary | ICD-10-CM | POA: Diagnosis not present

## 2018-07-11 DIAGNOSIS — F1021 Alcohol dependence, in remission: Secondary | ICD-10-CM | POA: Diagnosis not present

## 2018-07-11 DIAGNOSIS — I739 Peripheral vascular disease, unspecified: Secondary | ICD-10-CM | POA: Diagnosis not present

## 2018-07-11 DIAGNOSIS — J449 Chronic obstructive pulmonary disease, unspecified: Secondary | ICD-10-CM | POA: Diagnosis not present

## 2018-07-11 DIAGNOSIS — D638 Anemia in other chronic diseases classified elsewhere: Secondary | ICD-10-CM | POA: Diagnosis not present

## 2018-07-11 DIAGNOSIS — D649 Anemia, unspecified: Secondary | ICD-10-CM | POA: Diagnosis not present

## 2018-07-11 DIAGNOSIS — I2510 Atherosclerotic heart disease of native coronary artery without angina pectoris: Secondary | ICD-10-CM | POA: Diagnosis not present

## 2018-07-11 DIAGNOSIS — C182 Malignant neoplasm of ascending colon: Secondary | ICD-10-CM | POA: Diagnosis not present

## 2018-07-11 DIAGNOSIS — T8189XD Other complications of procedures, not elsewhere classified, subsequent encounter: Secondary | ICD-10-CM | POA: Diagnosis not present

## 2018-07-11 DIAGNOSIS — I10 Essential (primary) hypertension: Secondary | ICD-10-CM | POA: Diagnosis not present

## 2018-07-11 DIAGNOSIS — I482 Chronic atrial fibrillation: Secondary | ICD-10-CM | POA: Diagnosis not present

## 2018-07-11 NOTE — Patient Outreach (Signed)
[facility]) Care Management  07/11/2018  Vincent Mcclure 03/26/1932 [id number]   CSW received a return phone call from patient's wife. She indicated pt was re-hospitalized for 2 nights with fever/infection. He returned to SNF where they are now planning a home assessment to determine home needs/discharge plan. Pt was seen by the surgeon 07/10/2018 and the drains remain at this time.  Pt's wife is concerned about pt dc to home and having to manage drains, care giving, etc. She is fearful of the drains and infection; stating, "If he has to go back to surgery he may not survive".  CSW offered support and encouragement to wife- CSW will plan a f/u call or visit to SNF this week for further planning and updates.  Eduard Clos, MSW, [place] Worker  [place] [phone]

## 2018-07-12 NOTE — Progress Notes (Addendum)
[facility]  [phone] [phone]  Clinic Follow up Note   Patient Care Team: [name], DO as PCP - General (Family Medicine) Pyrtle, Lajuan Lines, MD as Consulting Physician (Gastroenterology) Allyn Kenner, MD as Consulting Physician (Dermatology) Dorothy Spark, MD as Consulting Physician (Cardiology) Levin Erp, [place] as Physician Assistant (Gastroenterology) Murrell Converse as Physician Assistant (Cardiology) Deirdre Peer, LCSW as [facility] 07/13/2018  SUMMARY OF ONCOLOGIC HISTORY: Oncology History   Cancer Staging Cancer of ascending colon pT4apN1 s/p right colectomy 05/19/2018 Staging form: Colon and Rectum, AJCC 8th Edition - Pathologic stage from 05/19/2018: Stage IIIB (pT4a, pN1, cM0) - Signed by Truitt Merle, MD on 05/24/2018       Cancer of ascending colon pT4apN1 s/p right colectomy 05/19/2018   05/18/2018 Imaging    05/18/2018 CT AP IMPRESSION: 1. There appears to be a nearly obstructing lesion in the region of the hepatic flexure of the colon which is highly concerning for primary colonic neoplasm. Slight haziness of the surrounding soft tissues may be indicative of early local invasion. This appears partially obstructive as evidenced by dilatation of more proximal aspects of the small bowel and colon. 2. Left adrenal lesion is stable compared to prior studies, previously characterized as an adenoma. 3. Aortic atherosclerosis, in addition to left main and 3 vessel coronary artery disease. Status post median sternotomy for CABG. 4. Additional incidental findings, as above.  Aortic Atherosclerosis (ICD10-I70.0).    05/18/2018 - 05/30/2018 Hospital Admission    Admit date: 05/18/2018 Admission diagnosis: Colon Cancer Discharged on: 05/30/2018    05/19/2018 Cancer Staging    Staging form: Colon and Rectum, AJCC 8th Edition - Pathologic stage from 05/19/2018: Stage IIIB (pT4a, pN1, cM0) -  Signed by Truitt Merle, MD on 05/24/2018     05/19/2018 Pathology Results    05/19/2018 Surgical Pathology Diagnosis Colon, segmental resection for tumor, right - INVASIVE COLORECTAL ADENOCARCINOMA, 5 CM. - CARCINOMA FOCALLY LESS THAN 0.1 CM FROM SEROSAL SURFACE. - MARGINS NOT INVOLVED. - METASTATIC CARCINOMA IN ONE OF EIGHT LYMPH NODES (1/9). - BENIGN APPENDIX.    05/19/2018 Surgery    EXPLORATORY LAPAROTOMY with right colectomy with Fanny Skates, MD    05/24/2018 Initial Diagnosis    Cancer of right colon ([place])    06/12/2018 Imaging     06/12/2018 CT CAP IMPRESSION: 1. Evidence all bowel perforation/anastomosed breakdown with large volume intraperitoneal free air fluid in the RIGHT upper quadrant adjacent and above the liver. 2. Large loculated fluid collection in the RIGHT anterior pelvis extending along the LEFT pericolic gutter consists with PERITONEAL ABSCESS. 3. Of note, no large volume intraperitoneal free fluid. Oral contrast passed through the RIGHT colon anastomosis without evidence of leak. Findings could indicate a prior anastomotic breakdown which has healed in the interval with subsequent abscess formation from the prior leak / breakdown.    06/17/2018 Procedure    06/17/2018 US Thoracentesis IMPRESSION: Successful ultrasound guided right thoracentesis yielding 1 L of pleural fluid. No pneumothorax on post-procedure chest x-ray.  No malignancy on pathology     INTERVAL HISTORY: Mr. Soule returns for first outpatient visit. He was seen by Dr. [name] inpatient on 05/24/18 for initial consult for colon cancer. He underwent right hemicolectomy on 05/19/18 for obstructing colon cancer, pathology confirmed T4a lesion, N1. Post op course was complicated by anastomotic leak with abscess requiring percutaneous drainage catheter x2. He was readmitted several times. He developed left abdominal wall cellulitis and was  readmitted on 9/24. CT AP showed stable post surgical changes,  indwelling percutaneous drains were stable. No evidence of bowel obstructions. Most recently discharged to SNF on 07/06/18. He is on keflex. Left abdomen remains firm and slightly warm and red. No recurrent fever. He has minimal drainage from abdominal tubes. Midline incision is healing slowly but remains open, patient reports it is 5x7 mm in size. Dressings changed BID. His activity level continues to improve but not to baseline. He states he is 75% back to his normal, but continues to use wheelchair at SNF to get to and from bathroom. He performs exercises daily. Appetite is normal for him, has always been a "light eater." No n/v/d. Has regular dark stool BM every 1-3 days on miralax. On iron tab TID. Respiratory function at baseline, with intermittently productive cough and DOE. No chest pain. Legs with bilateral swelling, on lasix.    MEDICAL HISTORY:  Past Medical History:  Diagnosis Date  . Alcohol abuse   . Anemia   . Chronic low back pain 10/14/2011  . Colon polyps    hyperplastic (2004, 2010) and adenomatous (1990).    . COLONIC POLYPS, HX OF 03/05/2008  . Esophageal stricture    hx of  . Gastric ulcer   . GERD 03/05/2008  . GERD (gastroesophageal reflux disease) 1994   associated peptic strictures  . HYPERLIPIDEMIA 03/05/2008  . HYPERTENSION 03/05/2008  . Hypertension   . IBS 03/05/2008  . Impaired glucose tolerance 10/12/2011  . Iron deficiency anemia   . LEG CRAMPS 09/02/2010  . PAD (peripheral artery disease) ([place]) 10/14/2011  . PAD (peripheral artery disease) ([place]) 2013  . PERIPHERAL EDEMA 09/02/2010  . PERIPHERAL NEUROPATHY 03/05/2008  . Prostatitis    hx of  . VARICOSE VEINS, LOWER EXTREMITIES 06/09/2009    SURGICAL HISTORY: Past Surgical History:  Procedure Laterality Date  . CATARACT EXTRACTION  bilat  . COLECTOMY    . CORONARY ARTERY BYPASS GRAFT N/A 02/15/2017   Procedure: CORONARY ARTERY BYPASS GRAFTING times three  with left internal mammary harvest and endoscopic  harvest of Right SVG. Grafts of LIMA to  LAD, SVG to Distal Circ, and to First Diag.;  Surgeon: Grace Isaac, MD;  Location: [facility];  Service: Open Heart Surgery;  Laterality: N/A;  . ESOPHAGOGASTRODUODENOSCOPY N/A 11/14/2014   Procedure: ESOPHAGOGASTRODUODENOSCOPY (EGD);  Surgeon: Jerene Bears, MD;  Location: [facility] ENDOSCOPY;  Service: Endoscopy;  Laterality: N/A;  . IR RADIOLOGIST EVAL & MGMT  07/04/2018  . LAPAROTOMY N/A 05/19/2018   Procedure: EXPLORATORY LAPAROTOMY RIGHT COLECTOMY;  Surgeon: Fanny Skates, MD;  Location: [facility] ORS;  Service: General;  Laterality: N/A;  . LEFT HEART CATH AND CORONARY ANGIOGRAPHY N/A 02/14/2017   Procedure: Left Heart Cath and Coronary Angiography;  Surgeon: Belva Crome, MD;  Location: [facility];  Service: Cardiovascular;  Laterality: N/A;  . LUMBAR SPINE SURGERY  11/2008   Dr Joya Salm  . TEE WITHOUT CARDIOVERSION N/A 02/15/2017   Procedure: TRANSESOPHAGEAL ECHOCARDIOGRAM (TEE);  Surgeon: Grace Isaac, MD;  Location: [facility];  Service: Open Heart Surgery;  Laterality: N/A;    I have reviewed the social history and family history with the patient and they are unchanged from previous note.  ALLERGIES:  is allergic to no known allergies.  MEDICATIONS:  Current Outpatient Medications  Medication Sig Dispense Refill  . albuterol (PROVENTIL) (2.5 MG/3ML) 0.083% nebulizer solution Take 2.5 mg by nebulization 2 (two) times daily.    Marland Kitchen aspirin EC 81 MG  EC tablet Take 1 tablet (81 mg total) by mouth daily. 30 tablet 1  . cephALEXin (KEFLEX) 500 MG capsule Take 1 capsule (500 mg total) by mouth 3 (three) times daily for 8 days. 24 capsule 0  . ferrous sulfate 325 (65 FE) MG EC tablet Take 1 tablet (325 mg total) by mouth 3 (three) times daily with meals. (Patient taking differently: Take 325 mg by mouth daily with breakfast. ) 90 tablet 11  . furosemide (LASIX) 40 MG tablet Take 40 mg (1 tab) twice daily for 2 days, then take 40 mg (1 tab) daily 90 tablet 3  .  gabapentin (NEURONTIN) 100 MG capsule Take 1 capsule (100 mg total) by mouth 2 (two) times daily. 60 capsule 0  . guaiFENesin (MUCINEX) 600 MG 12 hr tablet Take 600 mg by mouth 2 (two) times daily.    . metoprolol succinate (TOPROL XL) 50 MG 24 hr tablet Take 1 tablet (50 mg total) by mouth daily. Take with or immediately following a meal. 30 tablet 11  . Multiple Vitamin (MULTIVITAMIN WITH MINERALS) TABS tablet Take 1 tablet by mouth daily.    . pneumococcal 13-valent conjugate vaccine (PREVNAR 13) SUSP injection Inject 0.5 mLs into the muscle once.    . pravastatin (PRAVACHOL) 40 MG tablet I po q hs (Patient taking differently: Take 40 mg by mouth at bedtime. ) 90 tablet 1  . traMADol (ULTRAM) 50 MG tablet Take 1 tablet (50 mg total) by mouth every 12 (twelve) hours as needed for moderate pain. 30 tablet 0  . umeclidinium-vilanterol (ANORO ELLIPTA) 62.5-25 MCG/INH AEPB Inhale 1 puff into the lungs daily.    . vitamin C (ASCORBIC ACID) 500 MG tablet Take 500 mg by mouth 2 (two) times daily.    Marland Kitchen acetaminophen (TYLENOL) 500 MG tablet Take 1 tablet (500 mg total) by mouth every 6 (six) hours as needed for mild pain. (Patient not taking: Reported on 07/13/2018)  0  . acetaminophen (TYLENOL) 650 MG CR tablet Take 650 mg by mouth every 6 (six) hours as needed for fever.     No current facility-administered medications for this visit.     PHYSICAL EXAMINATION: ECOG PERFORMANCE STATUS: 2 - Symptomatic, <50% confined to bed  Vitals:   07/13/18 0944  BP: 121/63  Pulse: 86  Resp: 18  Temp: 98.3 F (36.8 C)  SpO2: 97%   Filed Weights   07/13/18 0944  Weight: 206 lb 14.4 oz (93.8 kg)    GENERAL:alert, no distress and comfortable SKIN: no rashes or significant lesions EYES: sclera clear OROPHARYNX:no thrush or ulcers LYMPH:  no palpable cervical or supraclavicular lymphadenopathy  LUNGS: clear to auscultation and percussion with normal breathing effort HEART: regular rate & rhythm; mild  bilateral lower extremity edema ABDOMEN:abdomen soft, non-tender and normal bowel sounds. Midline incision is covered, dressing c/d/i, percutaneous drains intact to right abdomen, minimal milky drainage. Left abdomen/flank slightly warm, firm, and red to touch, no open wound  Musculoskeletal:no cyanosis of digits and no clubbing  NEURO: alert & oriented x 3 with fluent speech  LABORATORY DATA:  I have reviewed the data as listed CBC Latest Ref Rng & Units 07/13/2018 07/06/2018 07/05/2018  WBC 4.0 - 10.3 K/uL 5.6 5.8 6.5  Hemoglobin 13.0 - 17.1 g/dL 10.3(L) 9.0(L) 8.8(L)  Hematocrit 38.4 - 49.9 % 31.1(L) 29.7(L) 29.6(L)  Platelets 140 - 400 K/uL 237 238 239     CMP Latest Ref Rng & Units 07/13/2018 07/06/2018 07/05/2018  Glucose 70 - 99  mg/dL 111(H) 85 111(H)  BUN 8 - 23 mg/dL 10 8 8   Creatinine 0.61 - 1.24 mg/dL 0.82 0.85 0.86  Sodium 135 - 145 mmol/L 137 138 138  Potassium 3.5 - 5.1 mmol/L 3.8 4.2 3.2(L)  Chloride 98 - 111 mmol/L 99 103 105  CO2 22 - 32 mmol/L 29 27 23   Calcium 8.9 - 10.3 mg/dL 8.9 8.2(L) 7.6(L)  Total Protein 6.5 - 8.1 g/dL 6.3(L) - -  Total Bilirubin 0.3 - 1.2 mg/dL 0.4 - -  Alkaline Phos 38 - 126 U/L 100 - -  AST 15 - 41 U/L 16 - -  ALT 0 - 44 U/L 10 - -      RADIOGRAPHIC STUDIES: I have personally reviewed the radiological images as listed and agreed with the findings in the report. No results found.   ASSESSMENT & PLAN: KAYMON DENOMME is a 84 y.o. male with past medical history of CABG and back surgery, presented with acute abdominal pain and nausea, was found to have colonic bowel obstruction.  1. Adenocarcinoma of right colon, invasive adenocarcinoma, stage IIIB (pT4a, pN1, cM0), MSS -Pt initially presented to the ER with abdominal pain, distention and constipation. A CT AP revealed a 5 CM mass and bowel obstruction, biopsy showed invasive adenocarcinoma. Due to bowel obstruction, he underwent urgent right hemicolectomy.  We again reviewed surgical  pathology findings with patient and his wife today, unfortunately he had a T4a lesion and metastasis to one out of nine LNs. CT chest was negative.  -Dr. [name] previously discussed the high risk of cancer recurrence after his completed surgical resection, due to the T4 lesion and positive node, likely 50% -During initial inpatient consult Dr. [name] discussed standard adjuvant chemotherapy to reduce risk of recurrence for high risk colon cancer, especially stage III.  However due to his advanced age, limited performance status at the baseline, he was not felt to be a candidate for intravenous chemo.   -During initial consult Dr. [name] discussed colon cancer surveillance with routine lab, follow-up and scan.  She made appt to see him back in a month after his discharge but he cancelled due to recurrent hospitalizations, most recently discharged to SNF on 07/06/18 after admission for abdominal wall cellulitis and acute on chronic diastolic CHF. He continues to recover.  -Due to his recurrent infections and open abdominal wound, he is not a candidate for single agent xeloda at this point. He is not sure if he is interested in chemo, even if he recovers well.  [name] reviewed the optimal window for beginning adjuvant chemo is immediately after surgery, but can be done up to 3 months after, however, the benefit Is less  We encouraged him to continue eating well, hydrate, gain strength, and improve performance status in the meantime.  -we made an appointment for him to return in 2-3 weeks to monitor his improvement and finalize his treatment plan.  -He will see IR to assess percutaneous drains on 10/8 and f/u with [name] in 4 weeks   2. Anemia, like due to chronic blood loss -His Hg on 05/24/2018 was 12.1, now 10.3 today -will add ferritin and monitor closely    3. CAD S/P CABG -continue f/u with PCP   PLAN: -Labs and pathology reviewed -Lab, f/u in 2-3 weeks to monitor improvement and finalize treatment  plan for possible adjuvant single agent Xeloda vs surveillance only  -f/u IR 10/8, [name] 10/31   Orders Placed This Encounter  Procedures  . Ferritin  Standing Status:   Standing    Number of Occurrences:   20    Standing Expiration Date:   07/14/2019   All questions were answered. The patient knows to call the clinic with any problems, questions or concerns. No barriers to learning was detected.     Alla Feeling, NP 07/13/18    Addendum  I have seen the patient, examined him. I agree with the assessment and and plan and have edited the notes.   I initially saw Mr. College in the hospital after his colon surgery.  Unfortunately, he developed multiple complications after surgery, especially anastomotic leak with abscess, which required prolonged course of antibiotics and drainage tube X2.  He has had several hospitalizations, last one was about 10 days ago with left abdominal wall cellulitis.  It is improving.  He still has open wound at his incision.  Overall he is eating better with more energy, but has not been very strong.  We reviewed his surgical pathology findings, which showed a T4a lesion, and one positive lymph nodes.  He is certainly at very high risk for recurrence.  If he is able to recover well in the next 1 month, I may recommend single agent Xeloda as adjuvant therapy.  However due to his advanced age, multiple medical comorbidities, and the slow recovery after surgery, he may not recover well enough to receive Xeloda.  If he is still not ready on his next visit, I would not offer adjuvant therapy, the benefit of adjuvant chemo is uncertain beyound 3 months of surgery.  We discussed cancer surveillance, with routine office visit, lab, and scans.  He is interested in chemo, but not sure if he will be strong enough to receive it.  I plan to see him back in 3 weeks.  Truitt Merle  07/13/2018

## 2018-07-13 ENCOUNTER — Encounter: Payer: Self-pay | Admitting: Nurse Practitioner

## 2018-07-13 ENCOUNTER — Other Ambulatory Visit: Payer: Self-pay | Admitting: Nurse Practitioner

## 2018-07-13 ENCOUNTER — Telehealth: Payer: Self-pay | Admitting: Nurse Practitioner

## 2018-07-13 ENCOUNTER — Inpatient Hospital Stay: Payer: Medicare HMO | Attending: Nurse Practitioner | Admitting: Nurse Practitioner

## 2018-07-13 ENCOUNTER — Inpatient Hospital Stay: Payer: Medicare HMO

## 2018-07-13 ENCOUNTER — Ambulatory Visit: Payer: Self-pay | Admitting: *Deleted

## 2018-07-13 VITALS — BP 121/63 | HR 86 | Temp 98.3°F | Resp 18 | Ht 71.0 in | Wt 206.9 lb

## 2018-07-13 DIAGNOSIS — D509 Iron deficiency anemia, unspecified: Secondary | ICD-10-CM

## 2018-07-13 DIAGNOSIS — C182 Malignant neoplasm of ascending colon: Secondary | ICD-10-CM

## 2018-07-13 DIAGNOSIS — I10 Essential (primary) hypertension: Secondary | ICD-10-CM

## 2018-07-13 DIAGNOSIS — D649 Anemia, unspecified: Secondary | ICD-10-CM | POA: Insufficient documentation

## 2018-07-13 DIAGNOSIS — I2510 Atherosclerotic heart disease of native coronary artery without angina pectoris: Secondary | ICD-10-CM | POA: Diagnosis not present

## 2018-07-13 LAB — CBC WITH DIFFERENTIAL (CANCER CENTER ONLY)
Basophils Absolute: 0 10*3/uL (ref 0.0–0.1)
Basophils Relative: 0 %
EOS ABS: 0.2 10*3/uL (ref 0.0–0.5)
EOS PCT: 3 %
HCT: 31.1 % — ABNORMAL LOW (ref 38.4–49.9)
Hemoglobin: 10.3 g/dL — ABNORMAL LOW (ref 13.0–17.1)
LYMPHS ABS: 0.7 10*3/uL — AB (ref 0.9–3.3)
Lymphocytes Relative: 13 %
MCH: 26.8 pg — AB (ref 27.2–33.4)
MCHC: 33 g/dL (ref 32.0–36.0)
MCV: 81.3 fL (ref 79.3–98.0)
MONO ABS: 0.4 10*3/uL (ref 0.1–0.9)
MONOS PCT: 8 %
Neutro Abs: 4.3 10*3/uL (ref 1.5–6.5)
Neutrophils Relative %: 76 %
Platelet Count: 237 10*3/uL (ref 140–400)
RBC: 3.83 MIL/uL — ABNORMAL LOW (ref 4.20–5.82)
RDW: 16.7 % — ABNORMAL HIGH (ref 11.0–14.6)
WBC Count: 5.6 10*3/uL (ref 4.0–10.3)

## 2018-07-13 LAB — CMP (CANCER CENTER ONLY)
ALBUMIN: 2.7 g/dL — AB (ref 3.5–5.0)
ALT: 10 U/L (ref 0–44)
AST: 16 U/L (ref 15–41)
Alkaline Phosphatase: 100 U/L (ref 38–126)
Anion gap: 9 (ref 5–15)
BUN: 10 mg/dL (ref 8–23)
CHLORIDE: 99 mmol/L (ref 98–111)
CO2: 29 mmol/L (ref 22–32)
CREATININE: 0.82 mg/dL (ref 0.61–1.24)
Calcium: 8.9 mg/dL (ref 8.9–10.3)
GFR, Estimated: >60 mL/min (ref >60)
Glucose, Bld: 111 mg/dL — ABNORMAL HIGH (ref 70–99)
Potassium: 3.8 mmol/L (ref 3.5–5.1)
SODIUM: 137 mmol/L (ref 135–145)
Total Bilirubin: 0.4 mg/dL (ref 0.3–1.2)
Total Protein: 6.3 g/dL — ABNORMAL LOW (ref 6.5–8.1)

## 2018-07-13 LAB — CEA (IN HOUSE-CHCC): CEA (CHCC-IN HOUSE): 3.33 ng/mL (ref 0.00–5.00)

## 2018-07-13 NOTE — Telephone Encounter (Signed)
Gave pt avs and calendar  °

## 2018-07-18 ENCOUNTER — Other Ambulatory Visit: Payer: Self-pay | Admitting: *Deleted

## 2018-07-18 ENCOUNTER — Ambulatory Visit
Admission: RE | Admit: 2018-07-18 | Discharge: 2018-07-18 | Disposition: A | Discharge status: Home / Self Care | Payer: Medicare HMO | Source: Ambulatory Visit | Attending: General Surgery | Admitting: General Surgery

## 2018-07-18 ENCOUNTER — Encounter: Payer: Self-pay | Admitting: Radiology

## 2018-07-18 DIAGNOSIS — T8149XA Infection following a procedure, other surgical site, initial encounter: Principal | ICD-10-CM

## 2018-07-18 DIAGNOSIS — Z4803 Encounter for change or removal of drains: Secondary | ICD-10-CM | POA: Diagnosis not present

## 2018-07-18 DIAGNOSIS — T8143XA Infection following a procedure, organ and space surgical site, initial encounter: Secondary | ICD-10-CM

## 2018-07-18 DIAGNOSIS — K651 Peritoneal abscess: Secondary | ICD-10-CM | POA: Diagnosis not present

## 2018-07-18 HISTORY — PX: IR RADIOLOGIST EVAL & MGMT: IMG5224

## 2018-07-18 NOTE — Progress Notes (Signed)
Patient ID: Vincent Mcclure, male   DOB: [birth year], 84 y.o.   MRN: [id number]         Chief Complaint: Percutaneous drainage catheter evaluation and management  Referring Physician(s): Ingram,Haywood  History of Present Illness: Vincent Mcclure is a 84 y.o. male with past mental history significant for alcohol abuse, anemia, esophageal stricture, gastric ulcer, hyperlipidemia, hypertension, PAD, peripheral neuropathy and colon cancer who underwent a partial colectomy complicated by development of a postoperative intra-abdominal abscess for which the patient underwent CT-guided percutaneous catheter placement on 06/13/2018 (Dr. Earleen Newport).  Patient subsequent developed an additional enlarging air and fluid containing collection within the right upper abdomen for which he underwent placement of an additional percutaneous drainage catheter on 06/17/2018 Vernard Gambles).    The patient was initially seen in the interventional radiology drain clinic on 07/04/2018.  CT scan performed at that time demonstrates near complete resolution of the perihepatic abscess as well as significant reduction in size of residual fluid collection within the right lower abdomen/pelvis.    Drainage catheter injection was performed and while both drains were negative for definitive fistulous connection, the decision was made to maintain both drainage catheters secondary to persistent output.    The patient returns today to the interventional radiology drain clinic for drainage catheter evaluation and management.  Patient reports there is been little to no output from the percutaneous drainage catheter for the past several days.  He continues to flush both percutaneous drainage catheters.    Patient denies worsening abdominal pain, fever or chills.  Past Medical History:  Diagnosis Date  . Alcohol abuse   . Anemia   . Chronic low back pain 10/14/2011  . Colon polyps    hyperplastic (2004, 2010) and adenomatous (1990).    . COLONIC  POLYPS, HX OF 03/05/2008  . Esophageal stricture    hx of  . Gastric ulcer   . GERD 03/05/2008  . GERD (gastroesophageal reflux disease) 1994   associated peptic strictures  . HYPERLIPIDEMIA 03/05/2008  . HYPERTENSION 03/05/2008  . Hypertension   . IBS 03/05/2008  . Impaired glucose tolerance 10/12/2011  . Iron deficiency anemia   . LEG CRAMPS 09/02/2010  . PAD (peripheral artery disease) ([place]) 10/14/2011  . PAD (peripheral artery disease) ([place]) 2013  . PERIPHERAL EDEMA 09/02/2010  . PERIPHERAL NEUROPATHY 03/05/2008  . Prostatitis    hx of  . VARICOSE VEINS, LOWER EXTREMITIES 06/09/2009    Past Surgical History:  Procedure Laterality Date  . CATARACT EXTRACTION  bilat  . COLECTOMY    . CORONARY ARTERY BYPASS GRAFT N/A 02/15/2017   Procedure: CORONARY ARTERY BYPASS GRAFTING times three  with left internal mammary harvest and endoscopic harvest of Right SVG. Grafts of LIMA to  LAD, SVG to Distal Circ, and to First Diag.;  Surgeon: Grace Isaac, MD;  Location: [place];  Service: Open Heart Surgery;  Laterality: N/A;  . ESOPHAGOGASTRODUODENOSCOPY N/A 11/14/2014   Procedure: ESOPHAGOGASTRODUODENOSCOPY (EGD);  Surgeon: Jerene Bears, MD;  Location: [facility] ENDOSCOPY;  Service: Endoscopy;  Laterality: N/A;  . IR RADIOLOGIST EVAL & MGMT  07/04/2018  . IR RADIOLOGIST EVAL & MGMT  07/18/2018  . LAPAROTOMY N/A 05/19/2018   Procedure: EXPLORATORY LAPAROTOMY RIGHT COLECTOMY;  Surgeon: Fanny Skates, MD;  Location: [facility] OR[facility];  Service: General;  Laterality: N/A;  . LEFT HEART CATH AND CORONARY ANGIOGRAPHY N/A 02/14/2017   Procedure: Left Heart Cath and Coronary Angiography;  Surgeon: Belva Crome, MD;  Location: [place] [facility];  Service:  Cardiovascular;  Laterality: N/A;  . LUMBAR SPINE SURGERY  11/2008   Dr Joya Salm  . TEE WITHOUT CARDIOVERSION N/A 02/15/2017   Procedure: TRANSESOPHAGEAL ECHOCARDIOGRAM (TEE);  Surgeon: Grace Isaac, MD;  Location: [place];  Service: Open Heart Surgery;  Laterality: N/A;     Allergies: No known allergies  Medications: Prior to Admission medications   Medication Sig Start Date End Date Taking? Authorizing Provider  acetaminophen (TYLENOL) 500 MG tablet Take 1 tablet (500 mg total) by mouth every 6 (six) hours as needed for mild pain. Patient not taking: Reported on 07/13/2018 05/30/18   Rayburn, Floyce Stakes, PA-C  acetaminophen (TYLENOL) 650 MG CR tablet Take 650 mg by mouth every 6 (six) hours as needed for fever.    [provider]  albuterol (PROVENTIL) (2.5 MG/3ML) 0.083% nebulizer solution Take 2.5 mg by nebulization 2 (two) times daily.    [provider]  aspirin EC 81 MG EC tablet Take 1 tablet (81 mg total) by mouth daily. 02/25/17   Elgie Collard, PA-C  ferrous sulfate 325 (65 FE) MG EC tablet Take 1 tablet (325 mg total) by mouth 3 (three) times daily with meals. Patient taking differently: Take 325 mg by mouth daily with breakfast.  01/17/18   Opalski, Neoma Laming, DO  furosemide (LASIX) 40 MG tablet Take 40 mg (1 tab) twice daily for 2 days, then take 40 mg (1 tab) daily 07/06/18   Danford, Suann Larry, MD  gabapentin (NEURONTIN) 100 MG capsule Take 1 capsule (100 mg total) by mouth 2 (two) times daily. 05/30/18   Rayburn, Floyce Stakes, PA-C  guaiFENesin (MUCINEX) 600 MG 12 hr tablet Take 600 mg by mouth 2 (two) times daily.    [provider]  metoprolol succinate (TOPROL XL) 50 MG 24 hr tablet Take 1 tablet (50 mg total) by mouth daily. Take with or immediately following a meal. 05/24/17   Imogene Burn, PA-C  Multiple Vitamin (MULTIVITAMIN WITH MINERALS) TABS tablet Take 1 tablet by mouth daily.    [provider]  pneumococcal 13-valent conjugate vaccine (PREVNAR 13) SUSP injection Inject 0.5 mLs into the muscle once.    [provider]  pravastatin (PRAVACHOL) 40 MG tablet I po q hs Patient taking differently: Take 40 mg by mouth at bedtime.  01/17/18   Opalski, Neoma Laming, DO  traMADol (ULTRAM) 50 MG tablet Take 1  tablet (50 mg total) by mouth every 12 (twelve) hours as needed for moderate pain. 06/22/18   Shelly Coss, MD  umeclidinium-vilanterol (ANORO ELLIPTA) 62.5-25 MCG/INH AEPB Inhale 1 puff into the lungs daily.    [provider]  vitamin C (ASCORBIC ACID) 500 MG tablet Take 500 mg by mouth 2 (two) times daily.    [provider]     Family History  Problem Relation Age of Onset  . Cancer Mother        Brain Cancer  . Diabetes Father   . Heart disease Father        CAD  . Hypertension Father   . Stroke Father   . Diabetes Sister   . Stomach cancer Neg Hx   . Pancreatic cancer Neg Hx   . Colon cancer Neg Hx   . Esophageal cancer Neg Hx     Social History   Socioeconomic History  . Marital status: Married    Spouse name: Not on file  . Number of children: 4  . Years of education: Not on file  . Highest education level:  Not on file  Occupational History  . Occupation: Retired Biomedical scientist  . Financial resource strain: Not on file  . Food insecurity:    Worry: Not on file    Inability: Not on file  . Transportation needs:    Medical: Not on file    Non-medical: Not on file  Tobacco Use  . Smoking status: Former Smoker    Packs/day: 1.25    Years: 50.00    Pack years: 62.50    Types: Cigarettes    Last attempt to quit: 11/11/2008    Years since quitting: 9.6  . Smokeless tobacco: Former Systems developer    Types: Chew  Substance and Sexual Activity  . Alcohol use: Yes    Alcohol/week: 6.0 standard drinks    Types: 2 Glasses of wine, 4 Cans of beer per week  . Drug use: No  . Sexual activity: Never  Lifestyle  . Physical activity:    Days per week: Not on file    Minutes per session: Not on file  . Stress: Not on file  Relationships  . Social connections:    Talks on phone: Not on file    Gets together: Not on file    Attends religious service: Not on file    Active member of club or organization: Not on file    Attends meetings of clubs  or organizations: Not on file    Relationship status: Not on file  Other Topics Concern  . Not on file  Social History Narrative   ** Merged History Encounter **       4 sons    ECOG Status: 1 - Symptomatic but completely ambulatory  Review of Systems: A 12 point ROS discussed and pertinent positives are indicated in the HPI above.  All other systems are negative.  Review of Systems  Vital Signs: BP 127/71   Pulse 88   Temp 98.7 F (37.1 C)   SpO2 96%   Physical Exam  Mallampati Score:     Imaging: Dg Chest 2 View  Result Date: 07/04/2018 CLINICAL DATA:  84 year old male with shortness of breath. EXAM: CHEST - 2 VIEW COMPARISON:  Chest radiograph dated 06/18/2018 FINDINGS: Small bilateral pleural effusions and associated atelectatic changes of the lung bases. Superimposed pneumonia is not excluded. Clinical correlation is recommended. No pneumothorax. Mild cardiomegaly. There is atherosclerotic calcification of the aortic arch. Median sternotomy wires and postsurgical changes of CABG. No acute osseous pathology. IMPRESSION: Small bilateral pleural effusions and bibasilar atelectasis versus infiltrate. Electronically Signed   By: Anner Crete M.D.   On: 07/04/2018 22:52   Ct Abdomen Pelvis W Contrast  Result Date: 07/05/2018 CLINICAL DATA:  Right hemicolectomy 05/19/2018 for obstructing colon cancer, complicated by abscess requiring percutaneous drainage catheter placement x2. Left abdominal erythema and swelling. Fever. Cough. EXAM: CT ABDOMEN AND PELVIS WITH CONTRAST TECHNIQUE: Multidetector CT imaging of the abdomen and pelvis was performed using the standard protocol following bolus administration of intravenous contrast. CONTRAST:  151mL OMNIPAQUE IOHEXOL 300 MG/ML  SOLN COMPARISON:  07/04/2018 CT abdomen/pelvis. FINDINGS: Lower chest: Small to moderate dependent right pleural effusion, stable. Dependent bibasilar atelectasis. Lower sternotomy wires are intact. Coronary  atherosclerosis status post CABG. Hepatobiliary: Normal liver size. No liver mass. Collapsed gallbladder with no radiopaque cholelithiasis. No biliary ductal dilatation. Pancreas: Normal, with no mass or duct dilation. Spleen: Normal size spleen. Scattered punctate granulomatous splenic calcifications. No splenic mass. Adrenals/Urinary Tract: Left adrenal 2.3 cm nodule is stable since 2007  CT, compatible with a benign adenoma. No right adrenal nodule. No hydronephrosis. Small calyceal diverticulum in the upper right kidney. Simple 1.5 cm interpolar left renal cyst. Normal bladder containing excreted contrast. Stomach/Bowel: Normal non-distended stomach. Status post right hemicolectomy with ileocolic anastomosis in the right abdomen. No significant small bowel dilatation or definite small bowel wall thickening. Mild sigmoid diverticulosis. Stable mild wall thickening in the sigmoid colon adjacent to the right lower quadrant abscess. No new sites of wall thickening in the remnant large-bowel. Vascular/Lymphatic: Atherosclerotic nonaneurysmal abdominal aorta. Patent portal, splenic, hepatic and renal veins. No pathologically enlarged lymph nodes in the abdomen or pelvis. Reproductive: Normal size prostate. Other: Anterior right upper quadrant percutaneous drain terminates in the anterior right perihepatic space. Minimal contrast is noted surrounding the pigtail tip in this location from the tube injection procedure performed earlier today, with no measurable collection in this location. Anterior right lower quadrant percutaneous drainage catheter terminates within right lower quadrant thick-walled collection measuring 5.3 x 4.6 cm, which is filled with injected contrast and minimal gas, unchanged in size from earlier today. No new focal fluid collections. Musculoskeletal: No aggressive appearing focal osseous lesions. Marked thoracolumbar spondylosis. IMPRESSION: 1. No change compared to the CT scan performed 9 hours  earlier. 2. Anterior right perihepatic space collection is decompressed by indwelling percutaneous drain, with no residual measurable collection in this location. 3. Thick walled right lower quadrant collection with indwelling percutaneous drain is stable. No new focal fluid collections. 4. Stable postsurgical changes from right hemicolectomy with no evidence of bowel obstruction. Stable reactive wall thickening in the sigmoid colon adjacent to the right lower quadrant collection. 5. Stable small to moderate dependent right pleural effusion. 6. Stable left adrenal adenoma. 7.  Aortic Atherosclerosis (ICD10-I70.0). Electronically Signed   By: Ilona Sorrel M.D.   On: 07/05/2018 00:22   Ct Abdomen Pelvis W Contrast  Result Date: 07/04/2018 CLINICAL DATA:  Status post percutaneous catheter drainage right upper abdominal and pelvic abscesses after right hemicolectomy. EXAM: CT ABDOMEN AND PELVIS WITH CONTRAST TECHNIQUE: Multidetector CT imaging of the abdomen and pelvis was performed using the standard protocol following bolus administration of intravenous contrast. CONTRAST:  131mL ISOVUE-300 IOPAMIDOL (ISOVUE-300) INJECTION 61% COMPARISON:  06/20/2018 FINDINGS: Lower chest: Slight increase in volume of right basilar pleural fluid. Hepatobiliary: No focal liver abnormality is seen. No gallstones, gallbladder wall thickening, or biliary dilatation. Pancreas: Unremarkable. No pancreatic ductal dilatation or surrounding inflammatory changes. Spleen: Normal in size without focal abnormality. Adrenals/Urinary Tract: Adrenal glands are unremarkable. Kidneys are normal, without renal calculi, focal lesion, or hydronephrosis. Bladder is unremarkable. Stomach/Bowel: No evidence of bowel obstruction or inflammation. No free intraperitoneal air. Vascular/Lymphatic: No significant vascular findings are present. No enlarged abdominal or pelvic lymph nodes. Reproductive: Prostate is unremarkable. Other: Further decompression of  right anterior perihepatic abscess with resolution extraluminal air. There remains some fluid around the right pelvic drain measuring roughly 4.6 x 5.4 cm (previously 4.0 x 6.1 cm). No new fluid collections identified. Musculoskeletal: No acute or significant osseous findings. IMPRESSION: 1. Further decompression of right perihepatic abscess with resolution of extraluminal air. 2. Relatively stable volume of fluid surrounding the right pelvic drainage catheter. Electronically Signed   By: Aletta Edouard M.D.   On: 07/04/2018 16:44   Ct Abdomen Pelvis W Contrast  Result Date: 06/20/2018 CLINICAL DATA:  Anastomotic leak after right hemicolectomy and status post placement right pelvic percutaneous drainage catheter into large abscess on 06/13/2018 and additional right upper quadrant percutaneous drain in to  large cavity predominantly containing air on 06/17/2018. The right pelvic drainage catheter shows diminished output clinically. The right upper quadrant drainage catheter continues to have feculent output. EXAM: CT ABDOMEN AND PELVIS WITH CONTRAST TECHNIQUE: Multidetector CT imaging of the abdomen and pelvis was performed using the standard protocol following bolus administration of intravenous contrast. CONTRAST:  17mL OMNIPAQUE IOHEXOL 300 MG/ML  SOLN COMPARISON:  06/16/2018 FINDINGS: Lower chest: There is a small right pleural effusion and a trace amount of left pleural fluid. Bibasilar atelectasis present. Hepatobiliary: No focal liver abnormality is seen. No gallstones, gallbladder wall thickening, or biliary dilatation. Pancreas: Unremarkable. No pancreatic ductal dilatation or surrounding inflammatory changes. Spleen: Normal in size.  Stable calcified granulomata. Adrenals/Urinary Tract: Adrenal glands are unremarkable. Kidneys are normal, without renal calculi, focal lesion, or hydronephrosis. Bladder is unremarkable. Stomach/Bowel: No evidence of bowel obstruction. Vascular/Lymphatic: No significant  vascular findings are present. No enlarged abdominal or pelvic lymph nodes. Reproductive: Prostate is unremarkable. Other: At the level of the indwelling right upper quadrant percutaneous drain, the collection of predominantly air as well as fluid present previously has been decompressed. There remains a tiny rim of fluid as well as small bubbles of extraluminal air that extend to abut the superior liver. At the level of the right pelvic drainage catheter, the residual component of abscess shows some decrease in size since the prior study on 09/06 with some fluid remaining around the pigtail portion of the drainage catheter. The area of fluid now measures roughly 4 x 6 cm compared to 5 x 8 cm previously. No new abscess identified. Musculoskeletal: No acute or significant osseous findings. IMPRESSION: 1. Decompression of right upper quadrant air and fluid collection after percutaneous catheter drainage. There remains a tiny rim of fluid as well as small extraluminal gas bubbles in this region suspicious for continued leak from bowel. 2. Further decrease in size of residual abscess fluid collection in the pelvis after percutaneous catheter drainage. There remains some fluid around the drainage catheter in the right pelvis. Electronically Signed   By: Aletta Edouard M.D.   On: 06/20/2018 17:26   Dg Sinus/fist Tube Chk-non Gi  Result Date: 07/18/2018 CLINICAL DATA:  History of colon cancer post partial colectomy complicated by development of a postoperative intra-abdominal abscess for which the patient initially underwent CT-guided percutaneous drainage catheter placement on 06/13/2018 Earleen Newport). Patient subsequently developed an additional enlarging air and fluid containing collection within the right upper abdomen for which he underwent placement of an additional drainage catheter on 06/13/2018 Vernard Gambles). The patient was initially seen in the interventional radiology drain Clinic on 07/04/2018 with CT scan  performed that time demonstrating resolution of the perihepatic air and fluid collection and significant decrease in size of the residual collection within the right lower abdomen/pelvis. Contrast injection of both drains was negative for definitive fistulous connection however decision was made to preserved both drainage catheters at that time prior to removal. Patient returns today to the interventional radiology drain Clinic for drainage catheter evaluation and management Replacement reports little to no output from either percutaneous drainage catheter for the past several days. The patient continues to be diligent with flushing the drainage catheters. Patient is currently without complaint. Specifically, no worsening abdominal pain. No fever or chills. EXAM: ABSCESS INJECTION COMPARISON:  Drainage catheter injection-07/04/2018; CT abdomen and pelvis-07/04/2018; 06/12/2018; 06/16/2018; 06/20/2018; CT-guided right lower quadrant percutaneous drainage catheter placement-06/13/2018; CT-guided right upper quadrant percutaneous drainage catheter placement-06/17/2018 CONTRAST:  A total of 25 cc of Omnipaque 300 was administered  via both percutaneous drainage catheters. FLUOROSCOPY TIME:  54 seconds (15 mGy) TECHNIQUE: The patient was positioned supine on the fluoroscopy table. A preprocedural spot fluoroscopic image was obtained of the right upper and lower abdomen and existing percutaneous drainage catheters. Multiple spot fluoroscopic and radiographic images were obtained following the injection of a small amount of contrast via both existing percutaneous drainage catheters Images reviewed in decision was made to remove both percutaneous drainage catheters. Note, attempt was made to reach [name] prior to drainage catheter removal however a was unavailable by pager. The external portion of the drains was cut and both drainage catheters were removed intact. Dressings were placed. The patient tolerated the  procedure well without immediate postprocedural complication. FINDINGS: Preprocedural spot fluoroscopic image of the right upper abdomen demonstrates unchanged positioning of the existing percutaneous drain. Contrast injection demonstrates spillage of contrast adjacent to the anterior aspect the right lobe of the liver without fistulous connection to the intestines. There is opacification of the decompressed abscess cavity with reflux of contrast along the catheter tract to the skin surface. _________________________________________________________ Preprocedural spot fluoroscopic image of the right lower abdomen demonstrates unchanged positioning of the percutaneous drainage catheter with end coiled and locked over the right lower abdomen/pelvis. Contrast injection demonstrates opacification of decompressed abscess cavity, decreased in size compared to the 07/04/2018 examination. There is no definitive fistulous connection to the adjacent intestines with reflux of contrast along the catheter tract to the skin entrance site. IMPRESSION: Contrast injection of both the right upper and right lower percutaneous drainage catheters is negative for the presence of fistula. Given this finding, as well as lack of output from either drain, both drainage catheters were removed at the patient's bedside without incident. PLAN: The patient was instructed to maintain follow-up appoint with [name] scheduled for the coming week. Electronically Signed   By: [name] M.D.   On: 07/18/2018 12:52   Dg Sinus/fist Tube Chk-non Gi  Result Date: 07/04/2018 INDICATION: Status post percutaneous catheter drainage of right lower quadrant pelvic and right upper quadrant perihepatic abscess collections after prior right hemicolectomy. EXAM: 1. INJECTION OF RIGHT PELVIC PERCUTANEOUS DRAINAGE CATHETER UNDER FLUOROSCOPY 2. INJECTION OF RIGHT UPPER QUADRANT PERCUTANEOUS DRAINAGE CATHETER UNDER FLUOROSCOPY MEDICATIONS: None  ANESTHESIA/SEDATION: None CONTRAST:  Total of 60 mL Omnipaque 300 FLUOROSCOPY TIME:  2 minutes and 6 seconds.  128.6 mGy. COMPLICATIONS: None immediate. PROCEDURE: Contrast was injected through the right pelvic drainage catheter followed by the right upper abdominal drainage catheter. Fluoroscopic spot images and cine loop sequences were saved. The right pelvic drainage catheter was connected to new suction bulb drainage. The right upper abdominal drainage catheter was connected to gravity bag drainage. FINDINGS: Injection of the right pelvic drainage catheter demonstrates irregular shaped abscess cavity. No demonstrated bowl fistula to adjacent bowel. Injection of the right upper abdominal perihepatic drainage catheter demonstrates irregular shaped abscess cavity without evidence of visible fistula to bowel. IMPRESSION: Fluoroscopic contrast injection of both the right pelvic and right upper abdominal drainage catheters demonstrate irregular abscess cavities without evidence of demonstrated fistula to bowel. Electronically Signed   By: Aletta Edouard M.D.   On: 07/04/2018 16:49   Dg Abd Acute W/chest  Result Date: 06/19/2018 CLINICAL DATA:  Abdominal pain EXAM: DG ABDOMEN ACUTE W/ 1V CHEST COMPARISON:  06/18/2018 FINDINGS: Cardiac shadow is mildly enlarged but stable. Postoperative changes are seen. Aortic calcifications are again identified. Small bilateral pleural effusions are noted. Right upper quadrant drainage catheter is again noted and stable. Scattered  large and small bowel gas is seen. Right lower quadrant drainage catheter is also noted and stable. Degenerative changes of lumbar spine are seen. No free air is noted. No other focal abnormality is seen. IMPRESSION: Drainage catheters within the abdomen consistent with the given clinical history. Bilateral small effusions. Electronically Signed   By: Inez Catalina M.D.   On: 06/19/2018 11:18   Ir Radiologist Eval & Mgmt  Result Date:  07/18/2018 Please refer to notes tab for details about interventional procedure. (Op Note)  Ir Radiologist Eval & Mgmt  Result Date: 07/04/2018 Please refer to notes tab for details about interventional procedure. (Op Note)   Labs:  CBC: Recent Labs    07/04/18 2142 07/05/18 0449 07/06/18 0447 07/13/18 0910  WBC 8.1 6.5 5.8 5.6  HGB 9.3* 8.8* 9.0* 10.3*  HCT 30.4* 29.6* 29.7* 31.1*  PLT 269 239 238 237    COAGS: Recent Labs    06/02/18 1801 06/13/18 0957 07/04/18 2142  INR 1.15 1.20 1.22  APTT  --  37*  --     BMP: Recent Labs    07/04/18 2142 07/05/18 0449 07/06/18 0447 07/13/18 0910  NA 136 138 138 137  K 3.4* 3.2* 4.2 3.8  CL 103 105 103 99  CO2 25 23 27 29   GLUCOSE 170* 111* 85 111*  BUN 9 8 8 10   CALCIUM 7.9* 7.6* 8.2* 8.9  CREATININE 1.01 0.86 0.85 0.82  GFRNONAA >60 >60 >60 >60  GFRAA >60 >60 >60 >60    LIVER FUNCTION TESTS: Recent Labs    06/13/18 0438 06/14/18 0451 07/04/18 2142 07/13/18 0910  BILITOT 0.7 0.4 0.4 0.4  AST 29 20 19 16   ALT 33 26 13 10   ALKPHOS 90 79 85 100  PROT 5.3* 5.2* 5.1* 6.3*  ALBUMIN 2.2* 1.9* 2.3* 2.7*    TUMOR MARKERS: No results for input(s): AFPTM, CEA, CA199, CHROMGRNA in the last 8760 hours.  Assessment and Plan:  Vincent Mcclure is a 84 y.o. male with past mental history significant for alcohol abuse, anemia, esophageal stricture, gastric ulcer, hyperlipidemia, hypertension, PAD, peripheral neuropathy and colon cancer who underwent a partial colectomy complicated by development of a postoperative intra-abdominal abscess for which the patient underwent CT-guided percutaneous catheter placement on 06/13/2018 (Dr. Earleen Newport).  Patient subsequent developed an additional enlarging air and fluid containing collection within the right upper abdomen for which he underwent placement of an additional percutaneous drainage catheter on 06/17/2018 Vernard Gambles).    The patient was initially seen in the interventional radiology  drain clinic on 07/04/2018.  CT scan performed at that time demonstrates near complete resolution of the perihepatic abscess as well as significant reduction in size of residual fluid collection within the right lower abdomen/pelvis.   Patient reports there is been little to no output from the percutaneous drainage catheter for the past several days.   Repeat drainage catheter injection is again negative for evidence of fistulous connection between either decompressed abscess cavity and the adjacent intestines.  Of note, residual abscess cavity was the right lower abdomen/pelvis has significantly reduced in size.  Given the above findings as well as no significant output from either drainage catheter for the past several days, the decision was made to remove both percutaneous drainage catheters.  Note, attempt was made to reach referring surgeon, [name], prior to catheter removal how he is unavailable by pager.  Both drainage catheters were removed intact at the patient's bedside without incident.  Patient was instructed to maintain  all follow-up appointments with [name] and to complete any prescribed course of medications.  Patient knows to call the interventional radiology clinic with any interval questions or concerns to me otherwise return on a as needed basis.  A copy of this report was sent to the requesting provider on this date.  Electronically Signed: [name] 07/18/2018, 12:58 PM   I spent a total of 15 Minutes in face to face in clinical consultation, greater than 50% of which was counseling/coordinating care for percutaneous drainage catheter evaluation and management.

## 2018-07-18 NOTE — Patient Outreach (Signed)
[facility]) Care Management  07/18/2018  Vincent Mcclure [birth year] [id number]   CSW advised by [facility] that pt was released to home on 07/17/2018. CSW has attempted to reach pt and wife today by phone but no answer and no return call yet.  CSW was advised by SNF that [facility] has been arranged for HHPT, OT and RN. Also, a shower chair and a walker were ordered. Pt is to see his PCP on 07/19/2018 and per SNF still has the drains.  CSW will advise [facility] RNCM and place order for post SNF follow up.  CSW will attempt to reach pt/wife again later this week.  Eduard Clos, MSW, [place] Worker  [place] [phone]

## 2018-07-19 ENCOUNTER — Encounter: Payer: Self-pay | Admitting: *Deleted

## 2018-07-19 ENCOUNTER — Telehealth: Payer: Self-pay | Admitting: Family Medicine

## 2018-07-19 ENCOUNTER — Telehealth: Payer: Self-pay

## 2018-07-19 ENCOUNTER — Ambulatory Visit (INDEPENDENT_AMBULATORY_CARE_PROVIDER_SITE_OTHER): Payer: Medicare HMO | Admitting: Family Medicine

## 2018-07-19 ENCOUNTER — Encounter: Payer: Self-pay | Admitting: Family Medicine

## 2018-07-19 ENCOUNTER — Other Ambulatory Visit: Payer: Self-pay | Admitting: *Deleted

## 2018-07-19 ENCOUNTER — Telehealth: Payer: Self-pay | Admitting: Hematology

## 2018-07-19 ENCOUNTER — Ambulatory Visit: Payer: Self-pay | Admitting: *Deleted

## 2018-07-19 VITALS — BP 119/74 | HR 80 | Ht 71.0 in | Wt 202.2 lb

## 2018-07-19 DIAGNOSIS — L03311 Cellulitis of abdominal wall: Secondary | ICD-10-CM | POA: Diagnosis not present

## 2018-07-19 DIAGNOSIS — I5032 Chronic diastolic (congestive) heart failure: Secondary | ICD-10-CM | POA: Diagnosis not present

## 2018-07-19 DIAGNOSIS — K651 Peritoneal abscess: Secondary | ICD-10-CM | POA: Diagnosis not present

## 2018-07-19 DIAGNOSIS — C182 Malignant neoplasm of ascending colon: Secondary | ICD-10-CM

## 2018-07-19 DIAGNOSIS — L02211 Cutaneous abscess of abdominal wall: Secondary | ICD-10-CM | POA: Insufficient documentation

## 2018-07-19 DIAGNOSIS — K9189 Other postprocedural complications and disorders of digestive system: Secondary | ICD-10-CM | POA: Diagnosis not present

## 2018-07-19 DIAGNOSIS — K559 Vascular disorder of intestine, unspecified: Secondary | ICD-10-CM | POA: Diagnosis not present

## 2018-07-19 DIAGNOSIS — T8143XD Infection following a procedure, organ and space surgical site, subsequent encounter: Secondary | ICD-10-CM | POA: Diagnosis not present

## 2018-07-19 DIAGNOSIS — B966 Bacteroides fragilis [B. fragilis] as the cause of diseases classified elsewhere: Secondary | ICD-10-CM | POA: Diagnosis not present

## 2018-07-19 NOTE — Telephone Encounter (Addendum)
Called spoke to [facility], home health had some questions on medication needs - patient was using albuterol neb treatments, inhaler, and Protonix at [facility] but was not given RX for home.  Patient has appointment with Dr. Raliegh Scarlet today and this will be discussed with the patient at that time.   PT and Occupational Therapy have evals to do for the patient and will call back with requests. MPulliam, CMA/RT(R)

## 2018-07-19 NOTE — Telephone Encounter (Signed)
Patient's wife called stating Dr. [name] saw him last Thursday and discussed treatment.  Could not proceed because he had tubes in his stomach, those have been removed on 10/8, wants to know if treatment can proceed.

## 2018-07-19 NOTE — Telephone Encounter (Signed)
Called patient's wife informed her Dr. [name] wants to move his appointment from 10/25 to early next week to see if we can start Xeloda, she verbalized an understanding and will expect a call from scheduling, scheduling message was sent.

## 2018-07-19 NOTE — Patient Outreach (Signed)
[name]) Care Management  07/19/2018  Vincent Mcclure 1932-09-05 [id number]   CSW attempted to reach pt/wife again today for post SNF follow up without success. CSW has made referral to [place], Valente David, RN, and have requested that she advise CSW if she connects and assesses any needs for CSW.  CSW will send an unsuccessful outreach letter to pt by mail. CSW will plan a f/u call next week as well.    Eduard Clos, MSW, [place] Worker  [place] [phone]

## 2018-07-19 NOTE — Telephone Encounter (Signed)
Called regarding 10/14

## 2018-07-19 NOTE — Telephone Encounter (Signed)
Shraddha @ [facility] [phone] called to request verbal authorization on Physical & Occcupational Therapy for patient :  PT :  (1) time weekly for (1) week--- Then 2xs weekly for 1 week ----- then 1x weekly for 2weeks  Shraddha says Occupational Therapy will call with specifics for their requested need.   --Forwarding request to medical assistant to contact her at listed cell number    --glh

## 2018-07-19 NOTE — Progress Notes (Signed)
Impression and Recommendations:    1. Recent Abscess of abdominal wall-after percutaneous drains placed.  Status post resection colon cancer in recent past   2. r/o Colitis, ischemic (Vincent Mcclure)   3. Chronic diastolic CHF (congestive heart failure) (Vincent Mcclure)   4. Cancer of ascending colon pT4apN1 s/p right colectomy 05/19/2018      Recent Abscess of abdominal wall-after percutaneous drains placed.  Status post resection colon cancer in recent past  r/o Colitis, ischemic (Vincent Mcclure)  Chronic diastolic CHF (congestive heart failure) (Vincent Mcclure)  Cancer of ascending colon pT4apN1 s/p right colectomy 05/19/2018  Abdominal Cellulitis -Explained how our body heals from the outside to the inside, so the holes of his drain tubes should be healed -Explained that the surgeon would have to explain the nick that caused infection after his resection -Encouraged pt and spouse to discuss specific surgical questions with their surgeon as he will be the expert on the procedure -Explained the roles of the surgeon, radiologist, and PCP in managing his care post ED admission -Explained to the pt that he must speak to the surgeon about showering and healing of the incision  -strongly encouraged pt to contact the surgeon or the at home health to have their questions addressed -Encouraged the pt and his wife to write down questions for them in order to ensure all their questions are answered -Encouraged the pt to make a follow up with GI in the near future to ensure  -Encouraged pt to keep his hands clean and frequently wash hands to decrease exposure to germs  CHF -Encouraged pt to weigh himself every day to ensure fluid is not building up  -Discussed the risk of negative affects on his CHF due to additional fluid needed during his procedure -Encouraged pt to continue moving for 15 minutes every hour in order to keep fluid from building up and to decrease the possibility of pneumonia -Discussed the importance of watching  for negative symptoms such as increasing swelling in the lower extremities difficult or painful breathing -Discussed the importance of taking 3 breaths as deeply as he can every 10 minutes -Encouraged pt to make a follow up appointment with cardiology in the near future -Explained to pt how previous MI, atrial fibrillation and CHF can cause a negative impact on heart function after anesthesiology  Colorectal cancer -Strongly encouraged pt and wife to write down concerns about cancer medication in order to ensure all concerns are addressed -Encouraged pt to also make up a follow up appointment with his cancer team for next steps in treatment -Explained the name of surgery and the general idea behind its process -Explained to pt that in order to know the specific surgery, they will have to speak to the surgeon   Medications Discontinued During This Encounter  Medication Reason  . acetaminophen (TYLENOL) 650 MG CR tablet Change in therapy  . albuterol (PROVENTIL) (2.5 MG/3ML) 0.083% nebulizer solution Completed Course  . pneumococcal 13-valent conjugate vaccine (PREVNAR 13) SUSP injection Completed Course  . umeclidinium-vilanterol (ANORO ELLIPTA) 62.5-25 MCG/INH AEPB Completed Course     Gross side effects, risk and benefits, and alternatives of medications and treatment plan in general discussed with patient.  Patient is aware that all medications have potential side effects and we are unable to predict every side effect or drug-drug interaction that may occur.   Patient will call with any questions prior to using medication if they have concerns.    Expresses verbal understanding and consents to current therapy  and treatment regimen.  No barriers to understanding were identified.  Red flag symptoms and signs discussed in detail.  Patient expressed understanding regarding what to do in case of emergency\urgent symptoms  Please see AVS handed out to patient at the end of our visit for further  patient instructions/ counseling done pertaining to today's office visit.   Return for pt told to f/up in 4 mo or sooner if concerns.     Note:  This note was prepared with assistance of Dragon voice recognition software. Occasional wrong-word or sound-a-like substitutions may have occurred due to the inherent limitations of voice recognition software.  This document serves as a record of services personally performed by [name], MD. It was created on her behalf by Georga Bora, a trained medical scribe. The creation of this record is based on the scribe's personal observations and the provider's statements to them.   I have reviewed the above medical documentation for accuracy and completeness and I concur.  [name] 07/19/18    --------------------------------------------------------------------------------------------------------------------------------------------------------------------------------------------------------------------------------------------    Subjective:     HPI: Vincent Mcclure is a 84 y.o. male who presents to [facility] at [facility] today for issues as discussed below.  Wife also present as additional   Hospital Follow up Vincent Mcclure is an 84 yo with significant past medical history of CHF, PDD, CAD, AF not on AC, with recent colon cancer complicated by anastomotic leak and abcess. He recently had percutaneous drains placed and was admitted tot he ED on 07/04/18 with fever, left abdominal wall pain. In the ER, he was found to have temperature 100F, tachycardia, hypoxia, bilateral effusions, and elevated lactic acid. He was given IVF, antibiotics, and admitted for possible sepsis due to cellulitis of abdominal wall.  -Pt was discharged from ED into a skilled nursing facility  Colorectal ancer -Dr. [name] stated he was not a candidate for chemotherapy but there was a pill available for him, but he needs to take it within 90 days of  the surgery -Pt has stage 3 cancer, which has been found in a lymph node as well -States it has already been 60 days due to health complications of surgical resection -Wife made an appointment to see Dr. [name] soon   Congestive Heart Failure -States his breathing has been better because he's "been forcing himself to take deep breaths" periodically -Says he had been using a nebulizer twice a day previously but has stopped taking it -Pt states he caught a cold right before leaving the nursing facility and feels this is why his congestion has been worse recently -Wife states he has had some congestion -Pt states he has been having productive cough, but doesn't feel congested because he's been able to "get the fluid out" -Denies SOB, wheeziness, chest pain with deep breathing,  -States he has had some swelling in his legs but not recently and it stopped fairly quickly   Abdominal cellulitis -Pt denies fever, severe abdominal pain -Pt states he has had no issues with eating or passing urine and stool -States he had some chills after the removal of the tubes in his abdomen but he put on a sweater and was fine -Wife states he hasn't checked his temperature -Wife states a home care nurse came this morning, will return twice next week, and once per week after that -Pt wife is concerned about the healing of his drain tubes and the intestinal nick that caused the infection -Pt was curious if he could  shower again     Wt Readings from Last 3 Encounters:  07/19/18 202 lb 3.2 oz (91.7 kg)  07/13/18 206 lb 14.4 oz (93.8 kg)  07/06/18 213 lb 3 oz (96.7 kg)   BP Readings from Last 3 Encounters:  07/19/18 119/74  07/18/18 127/71  07/13/18 121/63   Pulse Readings from Last 3 Encounters:  07/19/18 80  07/18/18 88  07/13/18 86   BMI Readings from Last 3 Encounters:  07/19/18 28.20 kg/m  07/13/18 28.86 kg/m  07/06/18 29.73 kg/m     Patient Care Team    Relationship Specialty Notifications  Start End  [name], DO PCP - General Family Medicine  09/29/16   Pyrtle, Lajuan Lines, MD Consulting Physician Gastroenterology  09/29/16   Allyn Kenner, MD Consulting Physician Dermatology  09/29/16   Dorothy Spark, MD Consulting Physician Cardiology  04/06/17   Levin Erp, [place] Physician Assistant Gastroenterology  07/07/17   Imogene Burn, PA-C Physician Assistant Cardiology  07/07/17   Deirdre Peer, [name] Management  Admissions 06/22/18   Valente David, [place] Management  Admissions 07/18/18      Patient Active Problem List   Diagnosis Date Noted  . Chronic diastolic CHF (congestive heart failure) ([place]) 06/02/2018    Priority: High  . CAD (coronary artery disease), native coronary artery 03/14/2017    Priority: High  . Paroxysmal atrial fibrillation (Vincent Mcclure)     Priority: High  . s/p NSTEMI (non-ST elevated myocardial infarction); with subsequent urgent CABG  5/18 ([place]) 02/12/2017    Priority: High  . PAD (peripheral artery disease) ([place]) 10/14/2011    Priority: High  . Glucose intolerance (impaired glucose tolerance) 10/12/2011    Priority: High  . HLD (hyperlipidemia) 03/05/2008    Priority: High  . HTN (hypertension) 03/05/2008    Priority: High  . Overweight (BMI 25.0-29.9) 09/29/2016    Priority: Medium  . Acute esophagitis     Priority: Medium  . Acute gastric ulcer     Priority: Medium  . Duodenal ulcer disease     Priority: Medium  . Anemia, iron deficiency 03/05/2008    Priority: Medium  . GERD 03/05/2008    Priority: Medium  . IBS 03/05/2008    Priority: Medium  . COLONIC POLYPS, HX OF 03/05/2008    Priority: Medium  . Alcohol use/ h/o heavy abuse 08/11/2016    Priority: Low  . Blood in stool 03/05/2008    Priority: Low  . r/o Colitis, ischemic ([place]) 07/19/2018  . Recent Abscess of abdominal wall 07/19/2018  . Cellulitis of left abdominal wall 07/05/2018  . Intraabdominal fluid  collection 07/05/2018  . Peripheral edema   . Post-operative state   . Rupture of operation wound   . General weakness   . SOB (shortness of breath)   . CHF (congestive heart failure) ([place]) 06/06/2018  . Subacute confusional state 06/05/2018  . Hypoalbuminemia 06/03/2018  . Hypokalemia 06/02/2018  . Atrial fibrillation with RVR ([place]) 06/02/2018  . Cancer of ascending colon pT4apN1 s/p right colectomy 05/19/2018 05/24/2018  . 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 Chest pain 02/11/2017  . Left shoulder pain 01/24/2015  . h/o Anemia due to blood loss, acute 11/14/2014  . Eye pain   . Upper GI bleed 11/13/2014  . Lumbar disc disease 01/30/2014  . Hearing loss 03/23/2013  . Screening for other and unspecified cardiovascular conditions 03/23/2013  . Chronic low back pain 10/14/2011  . Preventative  health care 10/12/2011  . LEG CRAMPS 09/02/2010  . PERIPHERAL EDEMA 09/02/2010  . VARICOSE VEINS, LOWER EXTREMITIES 06/09/2009  . Hereditary and idiopathic peripheral neuropathy 03/05/2008    Past Medical history, Surgical history, Family history, Social history, Allergies and Medications have been entered into the medical record, reviewed and changed as needed.    Current Meds  Medication Sig  . acetaminophen (TYLENOL) 500 MG tablet Take 1 tablet (500 mg total) by mouth every 6 (six) hours as needed for mild pain.  Marland Kitchen aspirin EC 81 MG EC tablet Take 1 tablet (81 mg total) by mouth daily.  . ferrous sulfate 325 (65 FE) MG EC tablet Take 1 tablet (325 mg total) by mouth 3 (three) times daily with meals. (Patient taking differently: Take 325 mg by mouth daily with breakfast. )  . furosemide (LASIX) 40 MG tablet Take 40 mg (1 tab) twice daily for 2 days, then take 40 mg (1 tab) daily  . gabapentin (NEURONTIN) 100 MG capsule Take 1 capsule (100 mg total) by mouth 2 (two) times daily.  Marland Kitchen guaiFENesin (MUCINEX) 600 MG 12 hr tablet Take 600 mg by mouth 2 (two) times daily as needed.   . metoprolol succinate  (TOPROL XL) 50 MG 24 hr tablet Take 1 tablet (50 mg total) by mouth daily. Take with or immediately following a meal.  . Multiple Vitamin (MULTIVITAMIN WITH MINERALS) TABS tablet Take 1 tablet by mouth daily.  . pravastatin (PRAVACHOL) 40 MG tablet I po q hs (Patient taking differently: Take 40 mg by mouth at bedtime. )  . traMADol (ULTRAM) 50 MG tablet Take 1 tablet (50 mg total) by mouth every 12 (twelve) hours as needed for moderate pain.  . vitamin C (ASCORBIC ACID) 500 MG tablet Take 500 mg by mouth 2 (two) times daily.    Allergies:  Allergies  Allergen Reactions  . No Known Allergies      Review of Systems:  A fourteen system review of systems was performed and found to be positive as per HPI.   Objective:   Blood pressure 119/74, pulse 80, height 5\' 11"  (1.803 m), weight 202 lb 3.2 oz (91.7 kg), SpO2 95 %. Body mass index is 28.2 kg/m. General:  Well Developed, well nourished, appropriate for stated age.  Neuro:  Alert and oriented,  extra-ocular muscles intact  HEENT:  Normocephalic, atraumatic, neck supple, no carotid bruits appreciated  Skin:  no gross rash, warm, pink. Cardiac:  RRR, S1 S2 Respiratory:  Not using accessory muscles, speaking in full sentences- unlabored. Coarse breath sounds about 1/3 of the bases bilaterally  Vascular:  Ext warm, no cyanosis apprec.; cap RF less 2 sec. Psych:  No HI/SI, judgement and insight good, Euthymic mood. Full Affect.

## 2018-07-19 NOTE — Telephone Encounter (Signed)
Please move his next appointment form 10/25 to early next week, to see if we can start Xeloda, thanks   Truitt Merle MD

## 2018-07-20 ENCOUNTER — Other Ambulatory Visit: Payer: Self-pay | Admitting: *Deleted

## 2018-07-20 NOTE — Patient Outreach (Signed)
[facility]) Care Management  07/20/2018  TYNELL WINCHELL Aug 20, 1932 [id number]   Referral received from LCSW for transition of care post SNF discharge on 10/7.  Per chart, he was hospitalized 9/24-9/26 with cellulitis of the abdominal wall, recently diagnosed with colon cancer.  He also has history of HTN, PAD, MI, A-fib, CAD, GERD, and CHF.  Noted that wife [name] is contact person.  Call placed to wife, no answer.  HIPAA compliant voice message left.  Unsuccessful outreach letter already sent by LCSW on 10/9, will follow up within the next 4 business days.  Valente David, [place], MSN [place] [phone]

## 2018-07-21 DIAGNOSIS — K651 Peritoneal abscess: Secondary | ICD-10-CM | POA: Diagnosis not present

## 2018-07-21 DIAGNOSIS — B966 Bacteroides fragilis [B. fragilis] as the cause of diseases classified elsewhere: Secondary | ICD-10-CM | POA: Diagnosis not present

## 2018-07-21 DIAGNOSIS — K9189 Other postprocedural complications and disorders of digestive system: Secondary | ICD-10-CM | POA: Diagnosis not present

## 2018-07-21 DIAGNOSIS — T8143XD Infection following a procedure, organ and space surgical site, subsequent encounter: Secondary | ICD-10-CM | POA: Diagnosis not present

## 2018-07-21 DIAGNOSIS — L03311 Cellulitis of abdominal wall: Secondary | ICD-10-CM | POA: Diagnosis not present

## 2018-07-21 DIAGNOSIS — C182 Malignant neoplasm of ascending colon: Secondary | ICD-10-CM | POA: Diagnosis not present

## 2018-07-24 ENCOUNTER — Inpatient Hospital Stay: Payer: Medicare HMO

## 2018-07-24 ENCOUNTER — Telehealth: Payer: Self-pay | Admitting: Hematology

## 2018-07-24 ENCOUNTER — Ambulatory Visit: Payer: Self-pay | Admitting: *Deleted

## 2018-07-24 ENCOUNTER — Inpatient Hospital Stay (HOSPITAL_BASED_OUTPATIENT_CLINIC_OR_DEPARTMENT_OTHER): Payer: Medicare HMO | Admitting: Hematology

## 2018-07-24 VITALS — HR 54 | Temp 98.2°F | Resp 16 | Ht 71.0 in | Wt 203.9 lb

## 2018-07-24 DIAGNOSIS — D509 Iron deficiency anemia, unspecified: Secondary | ICD-10-CM

## 2018-07-24 DIAGNOSIS — C182 Malignant neoplasm of ascending colon: Secondary | ICD-10-CM

## 2018-07-24 DIAGNOSIS — I2510 Atherosclerotic heart disease of native coronary artery without angina pectoris: Secondary | ICD-10-CM | POA: Diagnosis not present

## 2018-07-24 DIAGNOSIS — D649 Anemia, unspecified: Secondary | ICD-10-CM | POA: Diagnosis not present

## 2018-07-24 LAB — CBC WITH DIFFERENTIAL (CANCER CENTER ONLY)
Abs Immature Granulocytes: 0.02 10*3/uL (ref 0.00–0.07)
BASOS ABS: 0 10*3/uL (ref 0.0–0.1)
Basophils Relative: 0 %
EOS ABS: 0.1 10*3/uL (ref 0.0–0.5)
EOS PCT: 2 %
HEMATOCRIT: 35.1 % — AB (ref 39.0–52.0)
HEMOGLOBIN: 10.7 g/dL — AB (ref 13.0–17.0)
Immature Granulocytes: 0 %
LYMPHS ABS: 1.5 10*3/uL (ref 0.7–4.0)
LYMPHS PCT: 23 %
MCH: 25.7 pg — ABNORMAL LOW (ref 26.0–34.0)
MCHC: 30.5 g/dL (ref 30.0–36.0)
MCV: 84.2 fL (ref 80.0–100.0)
MONOS PCT: 8 %
Monocytes Absolute: 0.5 10*3/uL (ref 0.1–1.0)
NRBC: 0 % (ref 0.0–0.2)
Neutro Abs: 4.2 10*3/uL (ref 1.7–7.7)
Neutrophils Relative %: 67 %
Platelet Count: 295 10*3/uL (ref 150–400)
RBC: 4.17 MIL/uL — ABNORMAL LOW (ref 4.22–5.81)
RDW: 15.6 % — ABNORMAL HIGH (ref 11.5–15.5)
WBC Count: 6.3 10*3/uL (ref 4.0–10.5)

## 2018-07-24 LAB — COMPREHENSIVE METABOLIC PANEL
ALBUMIN: 3.2 g/dL — AB (ref 3.5–5.0)
ALT: 12 U/L (ref 0–44)
ANION GAP: 9 (ref 5–15)
AST: 17 U/L (ref 15–41)
Alkaline Phosphatase: 85 U/L (ref 38–126)
BILIRUBIN TOTAL: 0.5 mg/dL (ref 0.3–1.2)
BUN: 10 mg/dL (ref 8–23)
CO2: 26 mmol/L (ref 22–32)
Calcium: 9.1 mg/dL (ref 8.9–10.3)
Chloride: 105 mmol/L (ref 98–111)
Creatinine, Ser: 0.82 mg/dL (ref 0.61–1.24)
GFR calc Af Amer: >60 mL/min (ref >60)
GLUCOSE: 152 mg/dL — AB (ref 70–99)
Potassium: 3.6 mmol/L (ref 3.5–5.1)
Sodium: 140 mmol/L (ref 135–145)
TOTAL PROTEIN: 6.5 g/dL (ref 6.5–8.1)

## 2018-07-24 NOTE — Telephone Encounter (Signed)
Gave pt avs and calendar  °

## 2018-07-24 NOTE — Progress Notes (Signed)
Vincent Mcclure  [phone] [phone]) [phone]  Clinic Follow up Note   Patient Care Team: [name], DO as PCP - General (Family Medicine) Pyrtle, Lajuan Lines, MD as Consulting Physician (Gastroenterology) Allyn Kenner, MD as Consulting Physician (Dermatology) Dorothy Spark, MD as Consulting Physician (Cardiology) Levin Erp, [place] as Physician Assistant (Gastroenterology) Murrell Converse as Physician Assistant (Cardiology) Deirdre Peer, LCSW as [place] Management Valente David, RN as [place] Management   Date of Service:  07/24/2018  SUMMARY OF ONCOLOGIC HISTORY: Oncology History   Cancer Staging Cancer of ascending colon pT4apN1 s/p right colectomy 05/19/2018 Staging form: Colon and Rectum, AJCC 8th Edition - Pathologic stage from 05/19/2018: Stage IIIB (pT4a, pN1, cM0) - Signed by Truitt Merle, MD on 05/24/2018       Cancer of ascending colon pT4apN1 s/p right colectomy 05/19/2018   05/18/2018 Imaging    05/18/2018 CT AP IMPRESSION: 1. There appears to be a nearly obstructing lesion in the region of the hepatic flexure of the colon which is highly concerning for primary colonic neoplasm. Slight haziness of the surrounding soft tissues may be indicative of early local invasion. This appears partially obstructive as evidenced by dilatation of more proximal aspects of the small bowel and colon. 2. Left adrenal lesion is stable compared to prior studies, previously characterized as an adenoma. 3. Aortic atherosclerosis, in addition to left main and 3 vessel coronary artery disease. Status post median sternotomy for CABG. 4. Additional incidental findings, as above.  Aortic Atherosclerosis (ICD10-I70.0).    05/18/2018 - 05/30/2018 Hospital Admission    Admit date: 05/18/2018 Admission diagnosis: Colon Cancer Discharged on: 05/30/2018    05/19/2018 Cancer Staging    Staging form: Colon and  Rectum, AJCC 8th Edition - Pathologic stage from 05/19/2018: Stage IIIB (pT4a, pN1, cM0) - Signed by Truitt Merle, MD on 05/24/2018     05/19/2018 Pathology Results    05/19/2018 Surgical Pathology Diagnosis Colon, segmental resection for tumor, right - INVASIVE COLORECTAL ADENOCARCINOMA, 5 CM. - CARCINOMA FOCALLY LESS THAN 0.1 CM FROM SEROSAL SURFACE. - MARGINS NOT INVOLVED. - METASTATIC CARCINOMA IN ONE OF EIGHT LYMPH NODES (1/9). - BENIGN APPENDIX.    05/19/2018 Surgery    EXPLORATORY LAPAROTOMY with right colectomy with Fanny Skates, MD    05/24/2018 Initial Diagnosis    Cancer of right colon ([place])    06/12/2018 Imaging     06/12/2018 CT CAP IMPRESSION: 1. Evidence all bowel perforation/anastomosed breakdown with large volume intraperitoneal free air fluid in the RIGHT upper quadrant adjacent and above the liver. 2. Large loculated fluid collection in the RIGHT anterior pelvis extending along the LEFT pericolic gutter consists with PERITONEAL ABSCESS. 3. Of note, no large volume intraperitoneal free fluid. Oral contrast passed through the RIGHT colon anastomosis without evidence of leak. Findings could indicate a prior anastomotic breakdown which has healed in the interval with subsequent abscess formation from the prior leak / breakdown.    06/17/2018 Procedure    06/17/2018 US Thoracentesis IMPRESSION: Successful ultrasound guided right thoracentesis yielding 1 L of pleural fluid. No pneumothorax on post-procedure chest x-ray.  No malignancy on pathology      History of present illness:   05/24/18 Vincent Mcclure is a 84 y.o. male who was admitted to the hospital on 05/18/2018 due to abdominal pain and constipation. He also vomited multiple times and was told he had a bowel obstruction. He was eventually diagnosed with adenocarcinoma of the colon  with metastasis to one LNs. On 05/19/2018 he had an exploratory laparotomy with right colectomy. Today, I see him at the hospital with his  wife. He is frustrated from the pain and is feeling down. He is bloated and his abdomen is distended. He is hard of hearing. His wife states that he had constipation before his ER visit and lost weight purposefully.  He had a CABG. He also had back surgery.  He started walking a mile/day after his CABG, but his legs started cramping and therefore, his activity now is more restricted. He is still trying to be active.    CURRENT THERAPY:  Xeloda started in 1-2 weeks one week on one week off.   INTERVAL HISTORY: Vincent Mcclure is here for follow up and discuss possible treatment options. He present to the clinic today with his wife. He is recovering well from surgery. He had right colectomy on 05/19/2018. He denies pain or discharge at site of surgery. He uses Miralax as needed for constipation. He takes iron pills     REVIEW OF SYSTEMS:   Constitutional: Denies fevers, chills or abnormal weight loss Eyes: Denies blurriness of vision Ears, nose, mouth, throat, and face: Denies mucositis or sore throat Respiratory: Denies cough, dyspnea or wheezes Cardiovascular: Denies palpitation, chest discomfort or lower extremity swelling Gastrointestinal:  Denies nausea, heartburn or change in bowel habits (+) recent right colectomy Skin: Denies abnormal skin rashes Lymphatics: Denies new lymphadenopathy or easy bruising Neurological:Denies numbness, tingling or new weaknesses Behavioral/Psych: Mood is stable, no new changes  All other systems were reviewed with the patient and are negative.  MEDICAL HISTORY:  Past Medical History:  Diagnosis Date  . Alcohol abuse   . Anemia   . Chronic low back pain 10/14/2011  . Colon polyps    hyperplastic (2004, 2010) and adenomatous (1990).    . COLONIC POLYPS, HX OF 03/05/2008  . Esophageal stricture    hx of  . Gastric ulcer   . GERD 03/05/2008  . GERD (gastroesophageal reflux disease) 1994   associated peptic strictures  . HYPERLIPIDEMIA 03/05/2008  .  HYPERTENSION 03/05/2008  . Hypertension   . IBS 03/05/2008  . Impaired glucose tolerance 10/12/2011  . Iron deficiency anemia   . LEG CRAMPS 09/02/2010  . PAD (peripheral artery disease) ([place]) 10/14/2011  . PAD (peripheral artery disease) ([place]) 2013  . PERIPHERAL EDEMA 09/02/2010  . PERIPHERAL NEUROPATHY 03/05/2008  . Prostatitis    hx of  . VARICOSE VEINS, LOWER EXTREMITIES 06/09/2009    SURGICAL HISTORY: Past Surgical History:  Procedure Laterality Date  . CATARACT EXTRACTION  bilat  . COLECTOMY    . CORONARY ARTERY BYPASS GRAFT N/A 02/15/2017   Procedure: CORONARY ARTERY BYPASS GRAFTING times three  with left internal mammary harvest and endoscopic harvest of Right SVG. Grafts of LIMA to  LAD, SVG to Distal Circ, and to First Diag.;  Surgeon: Grace Isaac, MD;  Location: [facility];  Service: Open Heart Surgery;  Laterality: N/A;  . ESOPHAGOGASTRODUODENOSCOPY N/A 11/14/2014   Procedure: ESOPHAGOGASTRODUODENOSCOPY (EGD);  Surgeon: Jerene Bears, MD;  Location: [facility] ENDOSCOPY;  Service: Endoscopy;  Laterality: N/A;  . IR RADIOLOGIST EVAL & MGMT  07/04/2018  . IR RADIOLOGIST EVAL & MGMT  07/18/2018  . LAPAROTOMY N/A 05/19/2018   Procedure: EXPLORATORY LAPAROTOMY RIGHT COLECTOMY;  Surgeon: Fanny Skates, MD;  Location: [facility] ORS;  Service: General;  Laterality: N/A;  . LEFT HEART CATH AND CORONARY ANGIOGRAPHY N/A 02/14/2017   Procedure: Left Heart  Cath and Coronary Angiography;  Surgeon: Belva Crome, MD;  Location: [facility] CV LAB;  Service: Cardiovascular;  Laterality: N/A;  . LUMBAR SPINE SURGERY  11/2008   Dr Joya Salm  . TEE WITHOUT CARDIOVERSION N/A 02/15/2017   Procedure: TRANSESOPHAGEAL ECHOCARDIOGRAM (TEE);  Surgeon: Grace Isaac, MD;  Location: [facility];  Service: Open Heart Surgery;  Laterality: N/A;    I have reviewed the social history and family history with the patient and they are unchanged from previous note.  ALLERGIES:  is allergic to no known allergies.  MEDICATIONS:  Current  Outpatient Medications  Medication Sig Dispense Refill  . acetaminophen (TYLENOL) 500 MG tablet Take 1 tablet (500 mg total) by mouth every 6 (six) hours as needed for mild pain.  0  . aspirin EC 81 MG EC tablet Take 1 tablet (81 mg total) by mouth daily. 30 tablet 1  . ferrous sulfate 325 (65 FE) MG EC tablet Take 1 tablet (325 mg total) by mouth 3 (three) times daily with meals. (Patient taking differently: Take 325 mg by mouth daily with breakfast. ) 90 tablet 11  . furosemide (LASIX) 40 MG tablet Take 40 mg (1 tab) twice daily for 2 days, then take 40 mg (1 tab) daily 90 tablet 3  . gabapentin (NEURONTIN) 100 MG capsule Take 1 capsule (100 mg total) by mouth 2 (two) times daily. 60 capsule 0  . guaiFENesin (MUCINEX) 600 MG 12 hr tablet Take 600 mg by mouth 2 (two) times daily as needed.     . metoprolol succinate (TOPROL XL) 50 MG 24 hr tablet Take 1 tablet (50 mg total) by mouth daily. Take with or immediately following a meal. 30 tablet 11  . Multiple Vitamin (MULTIVITAMIN WITH MINERALS) TABS tablet Take 1 tablet by mouth daily.    . pravastatin (PRAVACHOL) 40 MG tablet I po q hs (Patient taking differently: Take 40 mg by mouth at bedtime. ) 90 tablet 1  . traMADol (ULTRAM) 50 MG tablet Take 1 tablet (50 mg total) by mouth every 12 (twelve) hours as needed for moderate pain. 30 tablet 0  . vitamin C (ASCORBIC ACID) 500 MG tablet Take 500 mg by mouth 2 (two) times daily.     No current facility-administered medications for this visit.     PHYSICAL EXAMINATION: ECOG PERFORMANCE STATUS: 1 - Symptomatic but completely ambulatory  Vitals:   07/24/18 1021  Pulse: (!) 54  Resp: 16  Temp: 98.2 F (36.8 C)  SpO2: 99%   Filed Weights   07/24/18 1021  Weight: 203 lb 14.4 oz (92.5 kg)   GENERAL:alert, o distress and comfortable SKIN: skin color, texture, turgor are normal, no rashes or significant lesions EYES: normal, Conjunctiva are pink and non-injected, sclera clear OROPHARYNX:no  exudate, no erythema and lips, buccal mucosa, and tongue normal  NECK: supple, thyroid normal size, non-tender, without nodularity LYMPH:  no palpable lymphadenopathy in the cervical, axillary or inguinal LUNGS: clear to auscultation and percussion with normal breathing effort HEART: regular rate & rhythm and no murmurs and no lower extremity edema ABDOMEN:abdomen soft, non-tender and normal bowel sounds (+) right colectomy scar, healing well, no discharge or erythema (+) dressing clean  Musculoskeletal:no cyanosis of digits and no clubbing  NEURO: alert & oriented x 3 with fluent speech, no focal motor/sensory deficits  LABORATORY DATA:  I have reviewed the data as listed CBC Latest Ref Rng & Units 07/24/2018 07/13/2018 07/06/2018  WBC 4.0 - 10.5 K/uL 6.3 5.6 5.8  Hemoglobin 13.0 - 17.0 g/dL 10.7(L) 10.3(L) 9.0(L)  Hematocrit 39.0 - 52.0 % 35.1(L) 31.1(L) 29.7(L)  Platelets 150 - 400 K/uL 295 237 238     CMP Latest Ref Rng & Units 07/24/2018 07/13/2018 07/06/2018  Glucose 70 - 99 mg/dL 152(H) 111(H) 85  BUN 8 - 23 mg/dL 10 10 8   Creatinine 0.61 - 1.24 mg/dL 0.82 0.82 0.85  Sodium 135 - 145 mmol/L 140 137 138  Potassium 3.5 - 5.1 mmol/L 3.6 3.8 4.2  Chloride 98 - 111 mmol/L 105 99 103  CO2 22 - 32 mmol/L 26 29 27   Calcium 8.9 - 10.3 mg/dL 9.1 8.9 8.2(L)  Total Protein 6.5 - 8.1 g/dL 6.5 6.3(L) -  Total Bilirubin 0.3 - 1.2 mg/dL 0.5 0.4 -  Alkaline Phos 38 - 126 U/L 85 100 -  AST 15 - 41 U/L 17 16 -  ALT 0 - 44 U/L 12 10 -      RADIOGRAPHIC STUDIES: I have personally reviewed the radiological images as listed and agreed with the findings in the report. No results found.   ASSESSMENT & PLAN:  Vincent Mcclure is a 84 y.o. male with past medical history of CABG and back surgery, presented with acute abdominal pain and nausea, was found to have colonic bowel obstruction.   1. Adenocarcinoma of right colon, invasive adenocarcinoma, stage IIIB (pT4a, pN1, cM0), MSS -Pt initially  presented to the ER with abdominal pain, distention and constipation. A CT AP done revealed a 5 CM mass and bowel obstruction, which was biopsied and showed invasive adenocarcinoma.  The bowel obstruction, he underwent urgent right hemicolectomy.  I reviewed his surgical pathology findings with patient and his wife, unfortunately he had a T4a lesion and metastasis to one out of nine LNs. -We discussed the high risk of cancer recurrence after his completed surgical resection, due to the T4 lesion and positive node. CT chest was negative.  -We discussed the standard adjuvant chemotherapy for high risk colon cancer, especially stage III.  However due to his advanced age, limited performance status at the baseline, he is not a candidate for intravenous chemo.   -He has right colectomy on 05/16/2018.  He had multiple complications after surgery, but overall now he recovered well.  Abdominal drain tube was removed last week.  His surgery site is clean without sings or infection or inflammation.   -Patient is motivated to try adjuvant chemotherapy Xeloda.  I discussed the potential benefit and risks, he would like to proceed.  --Chemotherapy consent: Side effects including but does not not limited to, fatigue, nausea, vomiting, diarrhea, hair loss, neuropathy, fluid retention, renal and kidney dysfunction, neutropenic fever, needed for blood transfusion, bleeding, were discussed with patient in great detail. He agrees to proceed. -The goal of therapy is curative, reduce his risk of recurrence. -due to his advanced age, I recommend Xeloda 1500mg  bid, one week on and week off regimen with him. If he tolerates well, will let him try full dose at 2000 mg twice daily from cycle 2.  -I educated him about staying away from large crowds due to risk of infection. -Labs reviewed, CBC showed Hg 10.7. CMP is WNL overall. Ferritin pending. -f/u in 3 weeks  2. Anemia, like due to chronic blood loss -He is on iron  pills -will monitor  -Hb at 10.7 today   3. CAD S/P CABG -continue f/u with PCP   PLAN: -f/u in 3 weeks with labs -start Xeloda next week. One week on  and week off. 1500mg  bid for the first cycle/week, then increase to 2000mg  bid if he can tolerate  -he refused the flu shot   No problem-specific Assessment & Plan notes found for this encounter.   No orders of the defined types were placed in this encounter.  All questions were answered. The patient knows to call the clinic with any problems, questions or concerns. No barriers to learning was detected. I spent 25 minutes counseling the patient face to face. The total time spent in the appointment was 30 minutes and more than 50% was on counseling and review of test results   I, Noor Dweik am acting as scribe for Dr. Truitt Merle.  I have reviewed the above documentation for accuracy and completeness, and I agree with the above.      Truitt Merle, MD 07/24/2018 10:57 AM   .

## 2018-07-25 ENCOUNTER — Encounter: Payer: Self-pay | Admitting: Hematology

## 2018-07-25 DIAGNOSIS — B966 Bacteroides fragilis [B. fragilis] as the cause of diseases classified elsewhere: Secondary | ICD-10-CM | POA: Diagnosis not present

## 2018-07-25 DIAGNOSIS — L03311 Cellulitis of abdominal wall: Secondary | ICD-10-CM | POA: Diagnosis not present

## 2018-07-25 DIAGNOSIS — K9189 Other postprocedural complications and disorders of digestive system: Secondary | ICD-10-CM | POA: Diagnosis not present

## 2018-07-25 DIAGNOSIS — C182 Malignant neoplasm of ascending colon: Secondary | ICD-10-CM | POA: Diagnosis not present

## 2018-07-25 DIAGNOSIS — T8143XD Infection following a procedure, organ and space surgical site, subsequent encounter: Secondary | ICD-10-CM | POA: Diagnosis not present

## 2018-07-25 DIAGNOSIS — K651 Peritoneal abscess: Secondary | ICD-10-CM | POA: Diagnosis not present

## 2018-07-25 LAB — FERRITIN: FERRITIN: 130 ng/mL (ref 24–336)

## 2018-07-25 MED ORDER — CAPECITABINE 500 MG PO TABS: 1500.0000 mg | ORAL_TABLET | Freq: Two times a day (BID) | ORAL | 0 refills | Status: DC

## 2018-07-26 ENCOUNTER — Ambulatory Visit: Payer: Self-pay | Admitting: *Deleted

## 2018-07-26 ENCOUNTER — Other Ambulatory Visit: Payer: Self-pay | Admitting: *Deleted

## 2018-07-26 MED ORDER — CAPECITABINE 500 MG PO TABS: 1500.0000 mg | ORAL_TABLET | Freq: Two times a day (BID) | ORAL | 0 refills | Status: DC

## 2018-07-26 NOTE — Patient Outreach (Signed)
[place] [facility]) Care Management  07/26/2018  JEDIDIAH DEMARTINI 08-05-1932 [id number]   [facility] RN advised CSW that pt's wife is requesting some food assistance.  CSW left voice message for pt/wife today and awaiting callback. CSW will try again tomorrow      Eduard Clos, MSW, [place] Worker  [place] [phone]

## 2018-07-26 NOTE — Patient Outreach (Signed)
[place] [facility]) Care Management  07/26/2018  Vincent Mcclure Nov 02, 1931 [id number]   2nd attempt made to contact member/wife for transition of care.  Wife [name] was hesitant to speak with this care manager, reluctantly verified member's identity.  [facility] care management services explained.  She report she is overwhelmed with the amount of visits (MD and home visits from home health) does not feel up to having an additional service.  Difference between [facility] and home health explained.  She feel she is managing member well.   Does not have much family in town to provide support.  She provides transportation to MD appointments (follow up appointment with oncologist and surgeon complete) as well as cook meals and help with bathing/dressing.  Request help with meals, advised that social worker will make contact to discuss community resources.    Will not open to nursing at this time per wife's request. Will collaborate with social worker in effort to provide support in the home.  Will also collaborate to see if member would benefit from being involved with [facility] NP.  Valente David, [place], MSN [name] [phone]

## 2018-07-26 NOTE — Addendum Note (Signed)
Addended by: Truitt Merle on: 07/26/2018 08:50 AM   Modules accepted: Orders

## 2018-07-27 ENCOUNTER — Other Ambulatory Visit: Payer: Medicare HMO

## 2018-07-27 ENCOUNTER — Other Ambulatory Visit: Payer: Self-pay

## 2018-07-27 ENCOUNTER — Ambulatory Visit: Payer: Medicare HMO | Admitting: Hematology

## 2018-07-27 ENCOUNTER — Other Ambulatory Visit: Payer: Self-pay | Admitting: *Deleted

## 2018-07-27 DIAGNOSIS — K9189 Other postprocedural complications and disorders of digestive system: Secondary | ICD-10-CM | POA: Diagnosis not present

## 2018-07-27 DIAGNOSIS — K651 Peritoneal abscess: Secondary | ICD-10-CM | POA: Diagnosis not present

## 2018-07-27 DIAGNOSIS — T8143XD Infection following a procedure, organ and space surgical site, subsequent encounter: Secondary | ICD-10-CM | POA: Diagnosis not present

## 2018-07-27 DIAGNOSIS — C182 Malignant neoplasm of ascending colon: Secondary | ICD-10-CM | POA: Diagnosis not present

## 2018-07-27 DIAGNOSIS — L03311 Cellulitis of abdominal wall: Secondary | ICD-10-CM | POA: Diagnosis not present

## 2018-07-27 DIAGNOSIS — B966 Bacteroides fragilis [B. fragilis] as the cause of diseases classified elsewhere: Secondary | ICD-10-CM | POA: Diagnosis not present

## 2018-07-27 NOTE — Patient Outreach (Signed)
[place] [facility]) Care Management  07/27/2018  Vincent Mcclure 1932-01-20 [id number]  BSW contacted [employer] to order meals for patients as requested by CSW Eduard Clos. BSW spoke with [facility] and successfully ordered a low sodium meal plan for the patient; order number [id number]. Expected date of delivery is Wednesday 10/23. BSW communicated outcome with CSW who plans to inform the patient.  Daneen Schick, BSW, CDP [facility] [phone]

## 2018-07-28 ENCOUNTER — Telehealth: Payer: Self-pay | Admitting: Pharmacist

## 2018-07-28 ENCOUNTER — Telehealth: Payer: Self-pay

## 2018-07-28 DIAGNOSIS — C182 Malignant neoplasm of ascending colon: Secondary | ICD-10-CM

## 2018-07-28 MED ORDER — CAPECITABINE 500 MG PO TABS: ORAL_TABLET | ORAL | 0 refills | Status: DC

## 2018-07-28 NOTE — Telephone Encounter (Signed)
Oral Oncology Pharmacist Encounter  Received new prescription for Xeloda (capecitabine) for the adjuvant treatment of stage III colon cancer, planned duration 6 months of Xeloda monotherapy.  Xeloda will be dosed on a 1 week on, 1 week off schedule for increased tolerance. Cycle length = 2 weeks 1st cycle of Xeloda at reduced dose of ~700mg /m2 BID for 1 week on, 1 week off If tolerated Xeloda will be increased to ~ 930 mg/m2 for 1 week on, 1 week off for subsequent cycles  Labs from 07/24/18 assessed, OK for treatment.  Current medication list in Epic reviewed, no DDIs with Xeloda identified.  Patient noted to have extensive cardiac history. Will be monitored as small incidences of cardiotoxicity have been observed with capecitabine, including myocardial infarction, ischemia, angina, dysrhythmias, cardiac arrest, cardiac failure, sudden death, ECG changes, and cardiomyopathy. These adverse events may be more common in patients with a history of coronary artery disease. In a scientific statement from the American Heart Association, capecitabine has been determined to be an agent that may either cause reversible direct myocardial toxicity or exacerbate underlying myocardial dysfunction (magnitude: moderate/major)  Prescription has been e-scribed to the [employer] for benefits analysis and approval.  Oral Oncology Clinic will continue to follow for insurance authorization, copayment issues, initial counseling and start date.  Johny Drilling, PharmD, BCPS, BCOP  07/28/2018 9:06 AM Oral Oncology Clinic [phone]

## 2018-07-28 NOTE — Patient Outreach (Signed)
[facility] [facility]) Care Management  07/28/2018  Vincent Mcclure Sep 18, 1932 [id number]   CSW made contact with pt's wife and discussed her concerns related to meals. She is struggling to prepare meals with all the needs and activity since pt was released from SNF. "the home health Nurse came out today".  CSW reminded wife that pt is eligible for [employer] program where they will ship frozen meals to him. She is agreeable to this arrangement and indicates he is on a regular diet with "low sodium".  CSW also discussed meals on wheels program and will discuss further with them in a week or so- "he thinks that is for people worse off than [place] but I told him I use to volunteer and deliver the meals".   Pt's wife indicates all is going well with the transition to home from SNF. CSW will plan a f/u call at the end of next week; [employer] dinners to be delivered 08/02/2018.    Eduard Clos, MSW, [place] Worker  [place] [phone]

## 2018-07-28 NOTE — Telephone Encounter (Signed)
Oral Oncology Patient Advocate Encounter  Received notification from [facility] that prior authorization for Xeloda is required.  PA submitted on CoverMyMeds Key [id number] Status is pending  Oral Oncology Clinic will continue to follow.  [place] Patient [place] Phone [phone] Fax [phone]

## 2018-07-31 ENCOUNTER — Telehealth: Payer: Self-pay | Admitting: Pharmacist

## 2018-07-31 MED FILL — CAPECITABINE 500 MG TABS: 500 | 28 days supply | Qty: 98 | Fill #0

## 2018-07-31 NOTE — Telephone Encounter (Signed)
Oral Oncology Patient Advocate Encounter  Received notification from [facility] that the request for prior authorization for [employer] has been denied due to needing to bill Part B.    I billed Part B and they still did not want to pay so I billed Part D and the claim went through with a $19.97 copay.  [place] Patient [place] Phone [phone] Fax [phone]

## 2018-07-31 NOTE — Telephone Encounter (Signed)
Oral Chemotherapy Pharmacist Encounter   I spoke with patient's Vincent Mcclure for overview of: Xeloda (capecitabine) for the adjuvant treatment of stage III colon cancer, planned duration 6 months of Xeloda monotherapy.  Counseled patient on administration, dosing, side effects, monitoring, drug-food interactions, safe handling, storage, and disposal.  Xeloda will be initiated on a dose titration schedule for increased tolerance.  Cycle length = 2 weeks  1st cycle of Xeloda at reduced dose of ~700mg /m2 BID for 1 week on, 1 week off  Week 1: Patient will take Xeloda 500mg  tablets, 3 tablets (1500mg ) by mouth in AM and 3 tabs (1500mg ) by mouth in PM, within 30 minutes of finishing meals for 1 week.  Week 2: off  If tolerated Xeloda will be increased to ~ 930 mg/m2 for 1 week on, 1 week off for subsequent cycles  Patient will take Xeloda 500mg  tablets, 4 tablets (2000mg ) by mouth in AM and 4 tabs (2000mg ) by mouth in PM, within 30 minutes of finishing meals, on days 1-7 and 8-21 of each 28 day cycle.   Week 4: off  Xeloda start date: 07/31/2018 (to start with evening dose)  Adverse effects include but are not limited to: fatigue, decreased blood counts, GI upset, diarrhea, and hand-foot syndrome.  They have anti-emetic on hand and know to take it if nausea develops.   They will obtain anti diarrheal and alert the office of 4 or more loose stools above baseline. Patient tends towards constipation and is currently taking MiraLAX daily for normal bowel habits. Vincent Mcclure counseled to not change anything about bowel regimen until after they are able to see how bowel habits are affected after Xeloda initiation.  We extensively discussed patient's cardiac past medical history. They will alert the office for any new shortness of breath, lower extremity edema, or heart palpitations.  Reviewed importance of keeping a medication schedule and plan for any missed doses.  [name] voiced understanding  and appreciation.   All questions answered. Medication reconciliation performed and medication/allergy list updated.  Vincent Mcclure will pick up patient's first fill of Xeloda from the [facility] today (07/31/2018) for copayment about $20 for first 4-week supply. This is affordable to them at this time. They understand that co-pay may increase starting again in January when insurance benefits reset. They will alert the office if they have any issues with medication acquisition.  Vincent Mcclure knows to call the office with questions or concerns. Oral Oncology Clinic will continue to follow.  Johny Drilling, PharmD, BCPS, BCOP  07/31/2018   9:50 AM Oral Oncology Clinic [phone]

## 2018-07-31 NOTE — Telephone Encounter (Signed)
Oral Oncology Patient Advocate Encounter  Confirmed with [place] that Xeloda was picked up on 07/31/18   [place] Patient [place] Phone [phone] Fax [phone]

## 2018-08-01 DIAGNOSIS — C182 Malignant neoplasm of ascending colon: Secondary | ICD-10-CM | POA: Diagnosis not present

## 2018-08-01 DIAGNOSIS — B966 Bacteroides fragilis [B. fragilis] as the cause of diseases classified elsewhere: Secondary | ICD-10-CM | POA: Diagnosis not present

## 2018-08-01 DIAGNOSIS — T8143XD Infection following a procedure, organ and space surgical site, subsequent encounter: Secondary | ICD-10-CM | POA: Diagnosis not present

## 2018-08-01 DIAGNOSIS — K651 Peritoneal abscess: Secondary | ICD-10-CM | POA: Diagnosis not present

## 2018-08-01 DIAGNOSIS — K9189 Other postprocedural complications and disorders of digestive system: Secondary | ICD-10-CM | POA: Diagnosis not present

## 2018-08-01 DIAGNOSIS — L03311 Cellulitis of abdominal wall: Secondary | ICD-10-CM | POA: Diagnosis not present

## 2018-08-03 DIAGNOSIS — B966 Bacteroides fragilis [B. fragilis] as the cause of diseases classified elsewhere: Secondary | ICD-10-CM | POA: Diagnosis not present

## 2018-08-03 DIAGNOSIS — K9189 Other postprocedural complications and disorders of digestive system: Secondary | ICD-10-CM | POA: Diagnosis not present

## 2018-08-03 DIAGNOSIS — K651 Peritoneal abscess: Secondary | ICD-10-CM | POA: Diagnosis not present

## 2018-08-03 DIAGNOSIS — C182 Malignant neoplasm of ascending colon: Secondary | ICD-10-CM | POA: Diagnosis not present

## 2018-08-03 DIAGNOSIS — L03311 Cellulitis of abdominal wall: Secondary | ICD-10-CM | POA: Diagnosis not present

## 2018-08-03 DIAGNOSIS — T8143XD Infection following a procedure, organ and space surgical site, subsequent encounter: Secondary | ICD-10-CM | POA: Diagnosis not present

## 2018-08-04 ENCOUNTER — Other Ambulatory Visit: Payer: Medicare HMO

## 2018-08-04 ENCOUNTER — Ambulatory Visit: Payer: Medicare HMO | Admitting: Hematology

## 2018-08-04 ENCOUNTER — Ambulatory Visit: Payer: Self-pay | Admitting: *Deleted

## 2018-08-05 ENCOUNTER — Other Ambulatory Visit: Payer: Self-pay | Admitting: *Deleted

## 2018-08-08 ENCOUNTER — Ambulatory Visit: Payer: Self-pay | Admitting: *Deleted

## 2018-08-08 DIAGNOSIS — K651 Peritoneal abscess: Secondary | ICD-10-CM | POA: Diagnosis not present

## 2018-08-08 DIAGNOSIS — K9189 Other postprocedural complications and disorders of digestive system: Secondary | ICD-10-CM | POA: Diagnosis not present

## 2018-08-08 DIAGNOSIS — L03311 Cellulitis of abdominal wall: Secondary | ICD-10-CM | POA: Diagnosis not present

## 2018-08-08 DIAGNOSIS — B966 Bacteroides fragilis [B. fragilis] as the cause of diseases classified elsewhere: Secondary | ICD-10-CM | POA: Diagnosis not present

## 2018-08-08 DIAGNOSIS — T8143XD Infection following a procedure, organ and space surgical site, subsequent encounter: Secondary | ICD-10-CM | POA: Diagnosis not present

## 2018-08-08 DIAGNOSIS — C182 Malignant neoplasm of ascending colon: Secondary | ICD-10-CM | POA: Diagnosis not present

## 2018-08-09 ENCOUNTER — Other Ambulatory Visit: Payer: Self-pay | Admitting: *Deleted

## 2018-08-10 ENCOUNTER — Other Ambulatory Visit: Payer: Self-pay | Admitting: *Deleted

## 2018-08-10 ENCOUNTER — Ambulatory Visit: Payer: Self-pay | Admitting: *Deleted

## 2018-08-10 NOTE — Patient Outreach (Signed)
[place] [facility]) Care Management  08/10/2018  MONTELL LEOPARD 1932/04/01 [id number]   CSW has been unable to reach pt/wife by phone. CSW has left a final voice message and asked for return call for updates and further needs.  CSW plans to close referral and will advise [facility] team of above.   Eduard Clos, MSW, [place] Worker  [place] [phone]

## 2018-08-11 ENCOUNTER — Other Ambulatory Visit: Payer: Self-pay | Admitting: *Deleted

## 2018-08-11 NOTE — Patient Outreach (Signed)
[facility]) Care Management  08/11/2018  FABRICIO ENDSLEY Mar 03, 1932 [id number]   Call placed to wife, [name], to follow up on needs.  No answer, HIPAA compliant voice message left.  She previously declined nursing services but agrees to contact from LCSW.  Unfortunately, LCSW was not able to contact member as well.  Will close case.  Will send closure letters to member and primary MD.  Valente David, RN, MSN [place] Manager [phone]

## 2018-08-15 DIAGNOSIS — B966 Bacteroides fragilis [B. fragilis] as the cause of diseases classified elsewhere: Secondary | ICD-10-CM | POA: Diagnosis not present

## 2018-08-15 DIAGNOSIS — C182 Malignant neoplasm of ascending colon: Secondary | ICD-10-CM | POA: Diagnosis not present

## 2018-08-15 DIAGNOSIS — T8143XD Infection following a procedure, organ and space surgical site, subsequent encounter: Secondary | ICD-10-CM | POA: Diagnosis not present

## 2018-08-15 DIAGNOSIS — L03311 Cellulitis of abdominal wall: Secondary | ICD-10-CM | POA: Diagnosis not present

## 2018-08-15 DIAGNOSIS — K9189 Other postprocedural complications and disorders of digestive system: Secondary | ICD-10-CM | POA: Diagnosis not present

## 2018-08-15 DIAGNOSIS — K651 Peritoneal abscess: Secondary | ICD-10-CM | POA: Diagnosis not present

## 2018-08-15 NOTE — Progress Notes (Signed)
[place]  Telephone:(336) [phone] Fax:(336) [phone]  Clinic Follow up Note   Patient Care Team: [name], DO as PCP - General (Family Medicine) Pyrtle, Lajuan Lines, MD as Consulting Physician (Gastroenterology) Allyn Kenner, MD as Consulting Physician (Dermatology) Dorothy Spark, MD as Consulting Physician (Cardiology) Levin Erp, [place] as Physician Assistant (Gastroenterology) Murrell Converse as Physician Assistant (Cardiology)   Date of Service:  08/17/2018   CHIEF COMPLAIN: F/U stage III colon cancer   SUMMARY OF ONCOLOGIC HISTORY: Oncology History   Cancer Staging Cancer of ascending colon pT4apN1 s/p right colectomy 05/19/2018 Staging form: Colon and Rectum, AJCC 8th Edition - Pathologic stage from 05/19/2018: Stage IIIB (pT4a, pN1, cM0) - Signed by Truitt Merle, MD on 05/24/2018       Cancer of ascending colon pT4apN1 s/p right colectomy 05/19/2018   05/18/2018 Imaging    05/18/2018 CT AP IMPRESSION: 1. There appears to be a nearly obstructing lesion in the region of the hepatic flexure of the colon which is highly concerning for primary colonic neoplasm. Slight haziness of the surrounding soft tissues may be indicative of early local invasion. This appears partially obstructive as evidenced by dilatation of more proximal aspects of the small bowel and colon. 2. Left adrenal lesion is stable compared to prior studies, previously characterized as an adenoma. 3. Aortic atherosclerosis, in addition to left main and 3 vessel coronary artery disease. Status post median sternotomy for CABG. 4. Additional incidental findings, as above.  Aortic Atherosclerosis (ICD10-I70.0).    05/18/2018 - 05/30/2018 Hospital Admission    Admit date: 05/18/2018 Admission diagnosis: Colon Cancer Discharged on: 05/30/2018    05/19/2018 Cancer Staging    Staging form: Colon and Rectum, AJCC 8th Edition - Pathologic stage from 05/19/2018: Stage IIIB (pT4a, pN1, cM0) -  Signed by Truitt Merle, MD on 05/24/2018     05/19/2018 Pathology Results    05/19/2018 Surgical Pathology Diagnosis Colon, segmental resection for tumor, right - INVASIVE COLORECTAL ADENOCARCINOMA, 5 CM. - CARCINOMA FOCALLY LESS THAN 0.1 CM FROM SEROSAL SURFACE. - MARGINS NOT INVOLVED. - METASTATIC CARCINOMA IN ONE OF EIGHT LYMPH NODES (1/9). - BENIGN APPENDIX.    05/19/2018 Surgery    EXPLORATORY LAPAROTOMY with right colectomy with Fanny Skates, MD    05/24/2018 Initial Diagnosis    Cancer of right colon ([place])    06/12/2018 Imaging     06/12/2018 CT CAP IMPRESSION: 1. Evidence all bowel perforation/anastomosed breakdown with large volume intraperitoneal free air fluid in the RIGHT upper quadrant adjacent and above the liver. 2. Large loculated fluid collection in the RIGHT anterior pelvis extending along the LEFT pericolic gutter consists with PERITONEAL ABSCESS. 3. Of note, no large volume intraperitoneal free fluid. Oral contrast passed through the RIGHT colon anastomosis without evidence of leak. Findings could indicate a prior anastomotic breakdown which has healed in the interval with subsequent abscess formation from the prior leak / breakdown.    06/17/2018 Procedure    06/17/2018 US Thoracentesis IMPRESSION: Successful ultrasound guided right thoracentesis yielding 1 L of pleural fluid. No pneumothorax on post-procedure chest x-ray.  No malignancy on pathology    06/20/2018 Imaging    06/20/2018 CT AP IMPRESSION: 1. Decompression of right upper quadrant air and fluid collection after percutaneous catheter drainage. There remains a tiny rim of fluid as well as small extraluminal gas bubbles in this region suspicious for continued leak from bowel. 2. Further decrease in size of residual abscess fluid collection in the pelvis after percutaneous catheter  drainage. There remains some fluid around the drainage catheter in the right pelvis.    07/04/2018 Imaging    07/04/2018  CT AP IMPRESSION: 1. Further decompression of right perihepatic abscess with resolution of extraluminal air. 2. Relatively stable volume of fluid surrounding the right pelvic drainage catheter.    07/05/2018 Imaging    07/05/2018 CT AP IMPRESSION: 1. No change compared to the CT scan performed 9 hours earlier. 2. Anterior right perihepatic space collection is decompressed by indwelling percutaneous drain, with no residual measurable collection in this location. 3. Thick walled right lower quadrant collection with indwelling percutaneous drain is stable. No new focal fluid collections. 4. Stable postsurgical changes from right hemicolectomy with no evidence of bowel obstruction. Stable reactive wall thickening in the sigmoid colon adjacent to the right lower quadrant collection. 5. Stable small to moderate dependent right pleural effusion. 6. Stable left adrenal adenoma. 7.  Aortic Atherosclerosis (ICD10-I70.0).    07/31/2018 -  Chemotherapy    Adjuvant Xeloda 2055m bid, 7 days on, 7 days of       History of present illness:   05/24/18 Vincent CUMMISKEYis a 84y.o. male who was admitted to the hospital on 05/18/2018 due to abdominal pain and constipation. He also vomited multiple times and was told he had a bowel obstruction. He was eventually diagnosed with adenocarcinoma of the colon with metastasis to one LNs. On 05/19/2018 he had an exploratory laparotomy with right colectomy. Today, I see him at the hospital with his wife. He is frustrated from the pain and is feeling down. He is bloated and his abdomen is distended. He is hard of hearing. His wife states that he had constipation before his ER visit and lost weight purposefully.  He had a CABG. He also had back surgery.  He started walking a mile/day after his CABG, but his legs started cramping and therefore, his activity now is more restricted. He is still trying to be active.    CURRENT THERAPY: Xeloda started one week on one  week off started 10/21//2019  INTERVAL HISTORY: LSABIR CHARTERSis here for follow up. He is here today with his wife. He is doing well. His surgical incision is healing well. He is tolerating Xeloda well. He felt sleepy yesterday and took several naps.    REVIEW OF SYSTEMS:   Constitutional: Denies fevers, chills or abnormal weight loss (+) sleepy  Eyes: Denies blurriness of vision Ears, nose, mouth, throat, and face: Denies mucositis or sore throat Respiratory: Denies cough, dyspnea or wheezes Cardiovascular: Denies palpitation, chest discomfort or lower extremity swelling Gastrointestinal:  Denies nausea, heartburn or change in bowel habits (+) recent right colectomy Skin: Denies abnormal skin rashes Lymphatics: Denies new lymphadenopathy or easy bruising Neurological:Denies numbness, tingling or new weaknesses Behavioral/Psych: Mood is stable, no new changes  All other systems were reviewed with the patient and are negative.  MEDICAL HISTORY:  Past Medical History:  Diagnosis Date  . Alcohol abuse   . Anemia   . Chronic low back pain 10/14/2011  . Colon polyps    hyperplastic (2004, 2010) and adenomatous (1990).    . COLONIC POLYPS, HX OF 03/05/2008  . Esophageal stricture    hx of  . Gastric ulcer   . GERD 03/05/2008  . GERD (gastroesophageal reflux disease) 1994   associated peptic strictures  . HYPERLIPIDEMIA 03/05/2008  . HYPERTENSION 03/05/2008  . Hypertension   . IBS 03/05/2008  . Impaired glucose tolerance 10/12/2011  . Iron deficiency  anemia   . LEG CRAMPS 09/02/2010  . PAD (peripheral artery disease) ([place]) 10/14/2011  . PAD (peripheral artery disease) ([place]) 2013  . PERIPHERAL EDEMA 09/02/2010  . PERIPHERAL NEUROPATHY 03/05/2008  . Prostatitis    hx of  . VARICOSE VEINS, LOWER EXTREMITIES 06/09/2009    SURGICAL HISTORY: Past Surgical History:  Procedure Laterality Date  . CATARACT EXTRACTION  bilat  . COLECTOMY    . CORONARY ARTERY BYPASS GRAFT N/A 02/15/2017    Procedure: CORONARY ARTERY BYPASS GRAFTING times three  with left internal mammary harvest and endoscopic harvest of Right SVG. Grafts of LIMA to  LAD, SVG to Distal Circ, and to First Diag.;  Surgeon: Grace Isaac, MD;  Location: [facility];  Service: Open Heart Surgery;  Laterality: N/A;  . ESOPHAGOGASTRODUODENOSCOPY N/A 11/14/2014   Procedure: ESOPHAGOGASTRODUODENOSCOPY (EGD);  Surgeon: Jerene Bears, MD;  Location: [facility] ENDOSCOPY;  Service: Endoscopy;  Laterality: N/A;  . IR RADIOLOGIST EVAL & MGMT  07/04/2018  . IR RADIOLOGIST EVAL & MGMT  07/18/2018  . LAPAROTOMY N/A 05/19/2018   Procedure: EXPLORATORY LAPAROTOMY RIGHT COLECTOMY;  Surgeon: Fanny Skates, MD;  Location: [facility] ORS;  Service: General;  Laterality: N/A;  . LEFT HEART CATH AND CORONARY ANGIOGRAPHY N/A 02/14/2017   Procedure: Left Heart Cath and Coronary Angiography;  Surgeon: Belva Crome, MD;  Location: [facility] CV LAB;  Service: Cardiovascular;  Laterality: N/A;  . LUMBAR SPINE SURGERY  11/2008   Dr Joya Salm  . TEE WITHOUT CARDIOVERSION N/A 02/15/2017   Procedure: TRANSESOPHAGEAL ECHOCARDIOGRAM (TEE);  Surgeon: Grace Isaac, MD;  Location: [facility];  Service: Open Heart Surgery;  Laterality: N/A;    I have reviewed the social history and family history with the patient and they are unchanged from previous note.  ALLERGIES:  is allergic to no known allergies.  MEDICATIONS:  Current Outpatient Medications  Medication Sig Dispense Refill  . acetaminophen (TYLENOL) 500 MG tablet Take 1 tablet (500 mg total) by mouth every 6 (six) hours as needed for mild pain.  0  . aspirin EC 81 MG EC tablet Take 1 tablet (81 mg total) by mouth daily. 30 tablet 1  . capecitabine (XELODA) 500 MG tablet take 4 tab (2071m) twice daily after meals, 7 days on and 7 days off 112 tablet 2  . ferrous sulfate 325 (65 FE) MG EC tablet Take 1 tablet (325 mg total) by mouth 3 (three) times daily with meals. (Patient taking differently: Take 325 mg by mouth  daily with breakfast. ) 90 tablet 11  . furosemide (LASIX) 40 MG tablet Take 40 mg (1 tab) twice daily for 2 days, then take 40 mg (1 tab) daily 90 tablet 3  . gabapentin (NEURONTIN) 100 MG capsule Take 1 capsule (100 mg total) by mouth 2 (two) times daily. 60 capsule 0  . guaiFENesin (MUCINEX) 600 MG 12 hr tablet Take 600 mg by mouth 2 (two) times daily as needed.     . metoprolol succinate (TOPROL XL) 50 MG 24 hr tablet Take 1 tablet (50 mg total) by mouth daily. Take with or immediately following a meal. 30 tablet 11  . Multiple Vitamin (MULTIVITAMIN WITH MINERALS) TABS tablet Take 1 tablet by mouth daily.    . pravastatin (PRAVACHOL) 40 MG tablet I po q hs (Patient taking differently: Take 40 mg by mouth at bedtime. ) 90 tablet 1  . Probiotic Product (ALIGN PO) Take by mouth.    . traMADol (ULTRAM) 50 MG tablet Take 1  tablet (50 mg total) by mouth every 12 (twelve) hours as needed for moderate pain. 30 tablet 0  . vitamin C (ASCORBIC ACID) 500 MG tablet Take 500 mg by mouth 2 (two) times daily.     No current facility-administered medications for this visit.     PHYSICAL EXAMINATION: ECOG PERFORMANCE STATUS: 1 - Symptomatic but completely ambulatory  Vitals:   08/17/18 1052  BP: (!) 134/96  Pulse: 88  Resp: 18  Temp: 97.8 F (36.6 C)  SpO2: 100%   Filed Weights   08/17/18 1052  Weight: 207 lb 1.6 oz (93.9 kg)   GENERAL:alert, o distress and comfortable SKIN: skin color, texture, turgor are normal, no rashes or significant lesions EYES: normal, Conjunctiva are pink and non-injected, sclera clear OROPHARYNX:no exudate, no erythema and lips, buccal mucosa, and tongue normal  NECK: supple, thyroid normal size, non-tender, without nodularity LYMPH:  no palpable lymphadenopathy in the cervical, axillary or inguinal LUNGS: clear to auscultation and percussion with normal breathing effort HEART: regular rate & rhythm and no murmurs and no lower extremity edema ABDOMEN:abdomen soft,  non-tender and normal bowel sounds (+) midline surgical scar, healed completely, no discharge or erythema  Musculoskeletal:no cyanosis of digits and no clubbing  NEURO: alert & oriented x 3 with fluent speech, no focal motor/sensory deficits  LABORATORY DATA:  I have reviewed the data as listed CBC Latest Ref Rng & Units 08/17/2018 07/24/2018 07/13/2018  WBC 4.0 - 10.5 K/uL 5.0 6.3 5.6  Hemoglobin 13.0 - 17.0 g/dL 11.8(L) 10.7(L) 10.3(L)  Hematocrit 39.0 - 52.0 % 38.1(L) 35.1(L) 31.1(L)  Platelets 150 - 400 K/uL 198 295 237     CMP Latest Ref Rng & Units 08/17/2018 07/24/2018 07/13/2018  Glucose 70 - 99 mg/dL 100(H) 152(H) 111(H)  BUN 8 - 23 mg/dL 13 10 10   Creatinine 0.61 - 1.24 mg/dL 1.00 0.82 0.82  Sodium 135 - 145 mmol/L 137 140 137  Potassium 3.5 - 5.1 mmol/L 3.6 3.6 3.8  Chloride 98 - 111 mmol/L 104 105 99  CO2 22 - 32 mmol/L 25 26 29   Calcium 8.9 - 10.3 mg/dL 9.1 9.1 8.9  Total Protein 6.5 - 8.1 g/dL 6.6 6.5 6.3(L)  Total Bilirubin 0.3 - 1.2 mg/dL 0.5 0.5 0.4  Alkaline Phos 38 - 126 U/L 84 85 100  AST 15 - 41 U/L 15 17 16   ALT 0 - 44 U/L 10 12 10    Tumor Markers CEA 07/13/2018: 3.33  PATHOLOGY 05/19/2018 Surgical Pathology Diagnosis Colon, segmental resection for tumor, right - INVASIVE COLORECTAL ADENOCARCINOMA, 5 CM. - CARCINOMA FOCALLY LESS THAN 0.1 CM FROM SEROSAL SURFACE. - MARGINS NOT INVOLVED. - METASTATIC CARCINOMA IN ONE OF EIGHT LYMPH NODES (1/9). - BENIGN APPENDIX. Microscopic Comment COLON AND RECTUM: Resection, Including Transanal Disk Excision of Rectal Neoplasms Procedure: Right colectomy Tumor Site: Proximal ascending colon Tumor Size: 5 cm Macroscopic Tumor Perforation: No Histologic Type: Colorectal adenocarcinoma. Histologic Grade: Moderately differentiated. Tumor Extension: Into pericolic connective tissue and focally less than 0.1 cm from the serosal surface. Margins: Free of tumor Treatment Effect: No Lymphovascular Invasion:  Present Perineural Invasion: Yes Tumor Deposits: No Regional Lymph Nodes: Number of Lymph Nodes Involved: 1 Number of Lymph Nodes Examined: 9 Pathologic Stage Classification (pTNM, AJCC 8th Edition): pT4a, pN1a, see comment. Ancillary Studies: MMR / MSI testing: Pending Representative tumor block: 33F, 1G, 1H and 1X Comments: The adenocarcinoma extends into the pericolonic connective tissue and focally is less than 0.1 cm from the serosal surface and is therefore  staged as pT4a. Addendum: The adipose tissue attached to the segment of colon is cleared and one additional benign lymph node is isolated and the oncology table is thus updated. (JDP:kh 05-24-18) (v4.0.1.0)al Information Specimen(s) Obtained: Colon, segmental resection for tumor, right Specimen Clinical Information Obstructed right colon, presumed cancer [rd] Gross Specimen: Received in formalin labeled right colon. Specimen integrity: Intact, with two stapled resection lines. Specimen length: There is 8.0 cm of terminal ileum and 30.5 cm of right colon. Mesorectal intactness: N/A. Tumor location: Within the proximal ascending colon. Tumor size: There is a 5.0 x 4.0 cm tan-red, centrally ulcerated lesion identified with slightly rolled borders. Percent of bowel circumference involved: 100% Tumor distance to margins: Proximal: 16.5 cm. Distal: 11.2 cm. Radial (posterior ascending, posterior descending; lateral and posterior mid-rectum; and entire lower 1/3 rectum): The serosal surface underlying the lesion, areas of puckering involving the surrounding adipose tissue are identified and inked black. The lesion measures approximately 1.0 cm from the radial margin (inked blue). Macroscopic extent of tumor invasion: The tumor invades through the muscularis propria into the subserosal adipose tissue. Total presumed lymph nodes: Fourteen tan-pink possible lymph nodes are identified, ranging from 0.2 cm to 1.5 x 1.0 x 0.6  cm. Extramural satellite tumor nodules: Identified within the surrounding omentum there is a 1.0 cm in greatest dimension tan, firm nodule identified. Mucosal polyp(s): 4.5 cm distal to the lesion there is a 0.7 cm in greatest dimension tan pink, sessile polyp identified. Additional findings: The mucosa proximal to the lesion is tan-red, hyperemic, with a small amount of tan loosely adherent softened material. The lumen is markedly dilated, and filled with an abundant amount of tan-Tuman liquid and partially digested food. The mucosa distal to the lesion is tan pink with normal folding. The appendix is present, and measures 4.3 cm in length x 1.2 cm in diameter. The serosal surface is red-purple and slightly dusky. The mucosa is tan, and the wall measures 0.3 cm in thickness, and the lumen is mildly dilated with a small amount of tan-gray fecal material. Block summary: A,B= proximal resection margin. C,D= distal resection margin. E= mucosal polyp, bisected. F-I= lesion. J= mucosa proximal to lesion. K,L= appendix. M= possible extramural nodule, bisected. N=three possible lymph nodes. O= three possible lymph nodes. P= two possible lymph nodes. Q-U= one possible lymph node bisected, each. V,W= one possible lymph node, serially sectioned. X= tissue for molecular studies. (K:gt, 05/22/18)   RADIOGRAPHIC STUDIES: I have personally reviewed the radiological images as listed and agreed with the findings in the report. No results found.   07/05/2018 CT AP IMPRESSION: 1. No change compared to the CT scan performed 9 hours earlier. 2. Anterior right perihepatic space collection is decompressed by indwelling percutaneous drain, with no residual measurable collection in this location. 3. Thick walled right lower quadrant collection with indwelling percutaneous drain is stable. No new focal fluid collections. 4. Stable postsurgical changes from right hemicolectomy with no evidence of bowel  obstruction. Stable reactive wall thickening in the sigmoid colon adjacent to the right lower quadrant collection. 5. Stable small to moderate dependent right pleural effusion. 6. Stable left adrenal adenoma. 7.  Aortic Atherosclerosis (ICD10-I70.0).   07/04/2018 CT AP IMPRESSION: 1. Further decompression of right perihepatic abscess with resolution of extraluminal air. 2. Relatively stable volume of fluid surrounding the right pelvic drainage catheter.   06/20/2018 CT AP IMPRESSION: 1. Decompression of right upper quadrant air and fluid collection after percutaneous catheter drainage. There remains a tiny rim of  fluid as well as small extraluminal gas bubbles in this region suspicious for continued leak from bowel. 2. Further decrease in size of residual abscess fluid collection in the pelvis after percutaneous catheter drainage. There remains some fluid around the drainage catheter in the right pelvis.  ASSESSMENT & PLAN:  Vincent Mcclure is a 84 y.o. male with past medical history of CABG and back surgery, presented with acute abdominal pain and nausea, was found to have colonic bowel obstruction.   1. Adenocarcinoma of right colon, invasive adenocarcinoma, stage IIIB (pT4a, pN1, cM0), MSS -Pt initially presented to the ER with abdominal pain, distention and constipation. A CT AP done revealed a 5 CM mass and bowel obstruction, which was biopsied and showed invasive adenocarcinoma.  The bowel obstruction, he underwent urgent right hemicolectomy.  I reviewed his surgical pathology findings with patient and his wife, unfortunately he had a T4a lesion and metastasis to one out of nine LNs. -We discussed the high risk of cancer recurrence after his completed surgical resection, due to the T4 lesion and positive node. CT chest was negative.  -We discussed the standard adjuvant chemotherapy for high risk colon cancer, especially stage III.  However due to his advanced age, limited  performance status at the baseline, he is not a candidate for intravenous chemo.   -He has right colectomy on 05/16/2018.  He had multiple complications after surgery, but overall now he recovered well.   -He has started adjuvant Xeloda 2000 mg twice daily, 7 days on, 7 days of, he is tolerating well, tinea, plan for a total of 6 months if he can tolerate  -Potential side effects of chemotherapy reviewed with him again, especially neutropenic fever -Labs reviewed, CBC showed Hg 11.8. CMP is WNL overall. Ferritin pending. -Repeat scan in 6 months for cancer surveillance -f/u in 3 weeks  2. Anemia, like due to chronic blood loss -He is on iron pills -will monitor  -Hb at 11.8 today  3. CAD S/P CABG -continue f/u with PCP   PLAN: -He is tolerating adjuvant Xeloda well, will continue 2000 mg twice daily, 7 days on and 7 days of  -f/u in 3 weeks with labs -Refilled Xeloda today  No problem-specific Assessment & Plan notes found for this encounter.   No orders of the defined types were placed in this encounter.  All questions were answered. The patient knows to call the clinic with any problems, questions or concerns. No barriers to learning was detected.  I spent 20 minutes counseling the patient face to face. The total time spent in the appointment was 25 minutes and more than 50% was on counseling and review of test results   I, Noor Dweik am acting as scribe for Dr. Truitt Merle.  I have reviewed the above documentation for accuracy and completeness, and I agree with the above.    Truitt Merle, MD 08/17/2018 8:51 PM   .

## 2018-08-17 ENCOUNTER — Telehealth: Payer: Self-pay | Admitting: Hematology

## 2018-08-17 ENCOUNTER — Encounter: Payer: Self-pay | Admitting: Hematology

## 2018-08-17 ENCOUNTER — Inpatient Hospital Stay: Payer: Medicare HMO | Attending: Nurse Practitioner

## 2018-08-17 ENCOUNTER — Inpatient Hospital Stay (HOSPITAL_BASED_OUTPATIENT_CLINIC_OR_DEPARTMENT_OTHER): Payer: Medicare HMO | Admitting: Hematology

## 2018-08-17 VITALS — BP 134/96 | HR 88 | Temp 97.8°F | Resp 18 | Ht 71.0 in | Wt 207.1 lb

## 2018-08-17 DIAGNOSIS — C182 Malignant neoplasm of ascending colon: Secondary | ICD-10-CM | POA: Diagnosis not present

## 2018-08-17 DIAGNOSIS — I2510 Atherosclerotic heart disease of native coronary artery without angina pectoris: Secondary | ICD-10-CM

## 2018-08-17 DIAGNOSIS — I509 Heart failure, unspecified: Secondary | ICD-10-CM | POA: Diagnosis not present

## 2018-08-17 DIAGNOSIS — Z79899 Other long term (current) drug therapy: Secondary | ICD-10-CM | POA: Diagnosis not present

## 2018-08-17 DIAGNOSIS — D509 Iron deficiency anemia, unspecified: Secondary | ICD-10-CM

## 2018-08-17 DIAGNOSIS — Z951 Presence of aortocoronary bypass graft: Secondary | ICD-10-CM | POA: Diagnosis not present

## 2018-08-17 DIAGNOSIS — Z9049 Acquired absence of other specified parts of digestive tract: Secondary | ICD-10-CM | POA: Diagnosis not present

## 2018-08-17 DIAGNOSIS — R05 Cough: Secondary | ICD-10-CM | POA: Diagnosis not present

## 2018-08-17 LAB — CBC WITH DIFFERENTIAL (CANCER CENTER ONLY)
Abs Immature Granulocytes: 0.01 10*3/uL (ref 0.00–0.07)
BASOS ABS: 0 10*3/uL (ref 0.0–0.1)
Basophils Relative: 0 %
EOS ABS: 0.2 10*3/uL (ref 0.0–0.5)
Eosinophils Relative: 3 %
HCT: 38.1 % — ABNORMAL LOW (ref 39.0–52.0)
Hemoglobin: 11.8 g/dL — ABNORMAL LOW (ref 13.0–17.0)
IMMATURE GRANULOCYTES: 0 %
LYMPHS ABS: 1.6 10*3/uL (ref 0.7–4.0)
LYMPHS PCT: 33 %
MCH: 26.5 pg (ref 26.0–34.0)
MCHC: 31 g/dL (ref 30.0–36.0)
MCV: 85.6 fL (ref 80.0–100.0)
Monocytes Absolute: 0.5 10*3/uL (ref 0.1–1.0)
Monocytes Relative: 11 %
NEUTROS PCT: 53 %
NRBC: 0 % (ref 0.0–0.2)
Neutro Abs: 2.7 10*3/uL (ref 1.7–7.7)
Platelet Count: 198 10*3/uL (ref 150–400)
RBC: 4.45 MIL/uL (ref 4.22–5.81)
RDW: 17.7 % — AB (ref 11.5–15.5)
WBC: 5 10*3/uL (ref 4.0–10.5)

## 2018-08-17 LAB — CMP (CANCER CENTER ONLY)
ALT: 10 U/L (ref 0–44)
ANION GAP: 8 (ref 5–15)
AST: 15 U/L (ref 15–41)
Albumin: 3.3 g/dL — ABNORMAL LOW (ref 3.5–5.0)
Alkaline Phosphatase: 84 U/L (ref 38–126)
BUN: 13 mg/dL (ref 8–23)
CHLORIDE: 104 mmol/L (ref 98–111)
CO2: 25 mmol/L (ref 22–32)
Calcium: 9.1 mg/dL (ref 8.9–10.3)
Creatinine: 1 mg/dL (ref 0.61–1.24)
Glucose, Bld: 100 mg/dL — ABNORMAL HIGH (ref 70–99)
Potassium: 3.6 mmol/L (ref 3.5–5.1)
SODIUM: 137 mmol/L (ref 135–145)
Total Bilirubin: 0.5 mg/dL (ref 0.3–1.2)
Total Protein: 6.6 g/dL (ref 6.5–8.1)

## 2018-08-17 LAB — FERRITIN: FERRITIN: 68 ng/mL (ref 24–336)

## 2018-08-17 MED ORDER — CAPECITABINE 500 MG PO TABS: ORAL_TABLET | ORAL | 2 refills | Status: DC

## 2018-08-17 NOTE — Telephone Encounter (Signed)
Appts scheduled avs/calendar printed per 11/7 los

## 2018-08-18 MED FILL — CAPECITABINE 500 MG TABS: 500 | 28 days supply | Qty: 112 | Fill #0

## 2018-08-22 ENCOUNTER — Other Ambulatory Visit: Payer: Self-pay | Admitting: Family Medicine

## 2018-08-22 DIAGNOSIS — T8143XD Infection following a procedure, organ and space surgical site, subsequent encounter: Secondary | ICD-10-CM | POA: Diagnosis not present

## 2018-08-22 DIAGNOSIS — L03311 Cellulitis of abdominal wall: Secondary | ICD-10-CM | POA: Diagnosis not present

## 2018-08-22 DIAGNOSIS — B966 Bacteroides fragilis [B. fragilis] as the cause of diseases classified elsewhere: Secondary | ICD-10-CM | POA: Diagnosis not present

## 2018-08-22 DIAGNOSIS — K651 Peritoneal abscess: Secondary | ICD-10-CM | POA: Diagnosis not present

## 2018-08-22 DIAGNOSIS — K9189 Other postprocedural complications and disorders of digestive system: Secondary | ICD-10-CM | POA: Diagnosis not present

## 2018-08-22 DIAGNOSIS — C182 Malignant neoplasm of ascending colon: Secondary | ICD-10-CM | POA: Diagnosis not present

## 2018-08-22 NOTE — Telephone Encounter (Signed)
Patient called requesting refill on Gabapentin, medication was last filled by a another provider.  LOV 07/19/2018 Please review and advised. MPulliam, CMA/RT(R)

## 2018-08-22 NOTE — Addendum Note (Signed)
Addended by: Lanier Prude D on: 08/22/2018 02:01 PM   Modules accepted: Orders

## 2018-08-22 NOTE — Telephone Encounter (Signed)
I have no idea what he is on this medicine for.  I have never evaluated him for this medicine\condition he is on it for.  He would need an office visit to discuss with me to ensure that this is still appropriate treatment and review his history of taking this medicine, how well he tolerates it, etc.

## 2018-08-22 NOTE — Telephone Encounter (Signed)
Patient came into office requesting a refill of his gabapentin, advised patient that the last provider to fill this was not Dr. Jenetta Downer but would send message for review to see if this is something we could refill for patient. If approved please send to [facility] on [address]

## 2018-08-23 NOTE — Telephone Encounter (Signed)
Please see my last note about RF of this med

## 2018-08-24 NOTE — Telephone Encounter (Signed)
Called and notified patient's wife that patient should make appointment to discuss medication. Patient to call back to make appointment. MPulliam, CMA/RT(R)

## 2018-09-05 ENCOUNTER — Other Ambulatory Visit: Payer: Self-pay

## 2018-09-05 DIAGNOSIS — I25810 Atherosclerosis of coronary artery bypass graft(s) without angina pectoris: Secondary | ICD-10-CM

## 2018-09-05 DIAGNOSIS — E782 Mixed hyperlipidemia: Secondary | ICD-10-CM

## 2018-09-05 MED ORDER — PRAVASTATIN SODIUM 40 MG PO TABS: 40.0000 mg | ORAL_TABLET | Freq: Every day | ORAL | 1 refills | Status: DC

## 2018-09-05 NOTE — Telephone Encounter (Signed)
Pharmacy sent refill request for Pravastatin.  Reviewed chart and sent in refills per office policy. MPulliam, CMA/RT(R)

## 2018-09-06 ENCOUNTER — Encounter: Payer: Self-pay | Admitting: Nurse Practitioner

## 2018-09-06 ENCOUNTER — Inpatient Hospital Stay (HOSPITAL_BASED_OUTPATIENT_CLINIC_OR_DEPARTMENT_OTHER): Payer: Medicare HMO | Admitting: Nurse Practitioner

## 2018-09-06 ENCOUNTER — Inpatient Hospital Stay: Payer: Medicare HMO

## 2018-09-06 ENCOUNTER — Ambulatory Visit (HOSPITAL_COMMUNITY)
Admission: RE | Admit: 2018-09-06 | Discharge: 2018-09-06 | Disposition: A | Discharge status: Home / Self Care | Payer: Medicare HMO | Source: Ambulatory Visit | Attending: Nurse Practitioner | Admitting: Nurse Practitioner

## 2018-09-06 VITALS — BP 126/67 | HR 55 | Temp 98.1°F | Resp 17 | Ht 71.0 in | Wt 204.1 lb

## 2018-09-06 DIAGNOSIS — Z9049 Acquired absence of other specified parts of digestive tract: Secondary | ICD-10-CM | POA: Diagnosis not present

## 2018-09-06 DIAGNOSIS — R05 Cough: Secondary | ICD-10-CM | POA: Insufficient documentation

## 2018-09-06 DIAGNOSIS — D509 Iron deficiency anemia, unspecified: Secondary | ICD-10-CM

## 2018-09-06 DIAGNOSIS — C182 Malignant neoplasm of ascending colon: Secondary | ICD-10-CM | POA: Diagnosis not present

## 2018-09-06 DIAGNOSIS — R058 Other specified cough: Secondary | ICD-10-CM

## 2018-09-06 DIAGNOSIS — Z951 Presence of aortocoronary bypass graft: Secondary | ICD-10-CM

## 2018-09-06 DIAGNOSIS — I509 Heart failure, unspecified: Secondary | ICD-10-CM | POA: Diagnosis not present

## 2018-09-06 DIAGNOSIS — Z79899 Other long term (current) drug therapy: Secondary | ICD-10-CM | POA: Diagnosis not present

## 2018-09-06 DIAGNOSIS — I2510 Atherosclerotic heart disease of native coronary artery without angina pectoris: Secondary | ICD-10-CM | POA: Diagnosis not present

## 2018-09-06 LAB — CMP (CANCER CENTER ONLY)
ALT: 7 U/L (ref 0–44)
AST: 13 U/L — ABNORMAL LOW (ref 15–41)
Albumin: 3.7 g/dL (ref 3.5–5.0)
Alkaline Phosphatase: 91 U/L (ref 38–126)
Anion gap: 8 (ref 5–15)
BUN: 16 mg/dL (ref 8–23)
CHLORIDE: 106 mmol/L (ref 98–111)
CO2: 28 mmol/L (ref 22–32)
Calcium: 9.5 mg/dL (ref 8.9–10.3)
Creatinine: 1.08 mg/dL (ref 0.61–1.24)
GFR, Est AFR Am: >60 mL/min (ref >60)
GFR, Estimated: >60 mL/min (ref >60)
Glucose, Bld: 108 mg/dL — ABNORMAL HIGH (ref 70–99)
POTASSIUM: 4.4 mmol/L (ref 3.5–5.1)
SODIUM: 142 mmol/L (ref 135–145)
Total Bilirubin: 0.5 mg/dL (ref 0.3–1.2)
Total Protein: 6.9 g/dL (ref 6.5–8.1)

## 2018-09-06 LAB — CBC WITH DIFFERENTIAL (CANCER CENTER ONLY)
ABS IMMATURE GRANULOCYTES: 0.01 10*3/uL (ref 0.00–0.07)
BASOS PCT: 0 %
Basophils Absolute: 0 10*3/uL (ref 0.0–0.1)
EOS ABS: 0.2 10*3/uL (ref 0.0–0.5)
Eosinophils Relative: 3 %
HCT: 39.6 % (ref 39.0–52.0)
Hemoglobin: 12.5 g/dL — ABNORMAL LOW (ref 13.0–17.0)
IMMATURE GRANULOCYTES: 0 %
Lymphocytes Relative: 40 %
Lymphs Abs: 2.5 10*3/uL (ref 0.7–4.0)
MCH: 26.9 pg (ref 26.0–34.0)
MCHC: 31.6 g/dL (ref 30.0–36.0)
MCV: 85.3 fL (ref 80.0–100.0)
MONO ABS: 0.7 10*3/uL (ref 0.1–1.0)
Monocytes Relative: 10 %
NEUTROS ABS: 3 10*3/uL (ref 1.7–7.7)
NEUTROS PCT: 47 %
PLATELETS: 174 10*3/uL (ref 150–400)
RBC: 4.64 MIL/uL (ref 4.22–5.81)
RDW: 19.5 % — AB (ref 11.5–15.5)
WBC: 6.3 10*3/uL (ref 4.0–10.5)
nRBC: 0 % (ref 0.0–0.2)

## 2018-09-06 LAB — FERRITIN: Ferritin: 64 ng/mL (ref 24–336)

## 2018-09-06 NOTE — Progress Notes (Signed)
[place]  Telephone:([phone]) [phone] [phone]  Clinic Follow up Note   Patient Care Team: [name], DO as PCP - General (Family Medicine) Pyrtle, Lajuan Lines, MD as Consulting Physician (Gastroenterology) Allyn Kenner, MD as Consulting Physician (Dermatology) Dorothy Spark, MD as Consulting Physician (Cardiology) Levin Erp, [place] as Physician Assistant (Gastroenterology) Murrell Converse as Physician Assistant (Cardiology) Date of Service: 09/06/18   SUMMARY OF ONCOLOGIC HISTORY: Oncology History   Cancer Staging Cancer of ascending colon pT4apN1 s/p right colectomy 05/19/2018 Staging form: Colon and Rectum, AJCC 8th Edition - Pathologic stage from 05/19/2018: Stage IIIB (pT4a, pN1, cM0) - Signed by Truitt Merle, MD on 05/24/2018       Cancer of ascending colon pT4apN1 s/p right colectomy 05/19/2018   05/18/2018 Imaging    05/18/2018 CT AP IMPRESSION: 1. There appears to be a nearly obstructing lesion in the region of the hepatic flexure of the colon which is highly concerning for primary colonic neoplasm. Slight haziness of the surrounding soft tissues may be indicative of early local invasion. This appears partially obstructive as evidenced by dilatation of more proximal aspects of the small bowel and colon. 2. Left adrenal lesion is stable compared to prior studies, previously characterized as an adenoma. 3. Aortic atherosclerosis, in addition to left main and 3 vessel coronary artery disease. Status post median sternotomy for CABG. 4. Additional incidental findings, as above.  Aortic Atherosclerosis (ICD10-I70.0).    05/18/2018 - 05/30/2018 Hospital Admission    Admit date: 05/18/2018 Admission diagnosis: Colon Cancer Discharged on: 05/30/2018    05/19/2018 Cancer Staging    Staging form: Colon and Rectum, AJCC 8th Edition - Pathologic stage from 05/19/2018: Stage IIIB (pT4a, pN1, cM0) - Signed by Truitt Merle, MD on 05/24/2018     05/19/2018 Pathology Results    05/19/2018 Surgical Pathology Diagnosis Colon, segmental resection for tumor, right - INVASIVE COLORECTAL ADENOCARCINOMA, 5 CM. - CARCINOMA FOCALLY LESS THAN 0.1 CM FROM SEROSAL SURFACE. - MARGINS NOT INVOLVED. - METASTATIC CARCINOMA IN ONE OF EIGHT LYMPH NODES (1/9). - BENIGN APPENDIX.    05/19/2018 Surgery    EXPLORATORY LAPAROTOMY with right colectomy with Fanny Skates, MD    05/24/2018 Initial Diagnosis    Cancer of right colon ([place])    06/12/2018 Imaging     06/12/2018 CT CAP IMPRESSION: 1. Evidence all bowel perforation/anastomosed breakdown with large volume intraperitoneal free air fluid in the RIGHT upper quadrant adjacent and above the liver. 2. Large loculated fluid collection in the RIGHT anterior pelvis extending along the LEFT pericolic gutter consists with PERITONEAL ABSCESS. 3. Of note, no large volume intraperitoneal free fluid. Oral contrast passed through the RIGHT colon anastomosis without evidence of leak. Findings could indicate a prior anastomotic breakdown which has healed in the interval with subsequent abscess formation from the prior leak / breakdown.    06/17/2018 Procedure    06/17/2018 US Thoracentesis IMPRESSION: Successful ultrasound guided right thoracentesis yielding 1 L of pleural fluid. No pneumothorax on post-procedure chest x-ray.  No malignancy on pathology    06/20/2018 Imaging    06/20/2018 CT AP IMPRESSION: 1. Decompression of right upper quadrant air and fluid collection after percutaneous catheter drainage. There remains a tiny rim of fluid as well as small extraluminal gas bubbles in this region suspicious for continued leak from bowel. 2. Further decrease in size of residual abscess fluid collection in the pelvis after percutaneous catheter drainage. There remains some fluid around the drainage catheter in the right pelvis.  07/04/2018 Imaging    07/04/2018 CT AP IMPRESSION: 1. Further  decompression of right perihepatic abscess with resolution of extraluminal air. 2. Relatively stable volume of fluid surrounding the right pelvic drainage catheter.    07/05/2018 Imaging    07/05/2018 CT AP IMPRESSION: 1. No change compared to the CT scan performed 9 hours earlier. 2. Anterior right perihepatic space collection is decompressed by indwelling percutaneous drain, with no residual measurable collection in this location. 3. Thick walled right lower quadrant collection with indwelling percutaneous drain is stable. No new focal fluid collections. 4. Stable postsurgical changes from right hemicolectomy with no evidence of bowel obstruction. Stable reactive wall thickening in the sigmoid colon adjacent to the right lower quadrant collection. 5. Stable small to moderate dependent right pleural effusion. 6. Stable left adrenal adenoma. 7.  Aortic Atherosclerosis (ICD10-I70.0).    07/31/2018 -  Chemotherapy    Adjuvant Xeloda 2077m bid, 7 days on, 7 days of    CURRENT THERAPY: Xeloda started one week on one week off started 10/21//2019  INTERVAL HISTORY: Mr. BDezielreturns for follow up and Xeloda as scheduled. He is off chemo pills this week. He is more fatigued when he is taking medication, but remains active and functional. Appetite is normal. No mucositis. He has had a few rare episodes of nausea, 4 overall since starting xeloda, usually occur after lying down for sleep. He attributes to acid reflux. Denies diarrhea. Bowels have been irregular for many years, he takes miralax routinely. Stools are loose at times. No blood in stool. Denies hand/foot redness or pain. No neuropathy. He has had an ongoing cough with chest congestion, he thinks is worse lately. Phlegm was clear previously but has turned yellow over last few days. Taking mucinex. Denies fever, chills, nasal congestion, chest pain, or increased dyspnea.    MEDICAL HISTORY:  Past Medical History:  Diagnosis Date  .  Alcohol abuse   . Anemia   . Chronic low back pain 10/14/2011  . Colon polyps    hyperplastic (2004, 2010) and adenomatous (1990).    . COLONIC POLYPS, HX OF 03/05/2008  . Esophageal stricture    hx of  . Gastric ulcer   . GERD 03/05/2008  . GERD (gastroesophageal reflux disease) 1994   associated peptic strictures  . HYPERLIPIDEMIA 03/05/2008  . HYPERTENSION 03/05/2008  . Hypertension   . IBS 03/05/2008  . Impaired glucose tolerance 10/12/2011  . Iron deficiency anemia   . LEG CRAMPS 09/02/2010  . P[place]ipheral artery disease) [place] 10/14/2011  . [place]  . PERIPHERAL EDEMA 09/02/2010  . PERIPHERAL NEUROPATHY 03/05/2008  . Prostatitis    hx of  . VARICOSE VEINS, LOWER EXTREMITIES 06/09/2009    SURGICAL HISTORY: Past Surgical History:  Procedure Laterality Date  . CATARACT EXTRACTION  bilat  . COLECTOMY    . CORONARY ARTERY BYPASS GRAFT N/A 02/15/2017   Procedure: CORONARY ARTERY BYPASS GRAFTING times three  with left internal mammary harvest and endoscopic harvest of Right SVG. Grafts of LIMA to  LAD, SVG to Distal Circ, and to First Diag.;  Surgeon: GGrace Isaac MD;  [place] [facility]  Service: Open Heart Surgery;  Laterality: N/A;  . ESOPHAGOGASTRODUODENOSCOPY N/A 11/14/2014   Procedure: ESOPHAGOGASTRODUODENOSCOPY (EGD);  Surgeon: JJerene Bears MD;  [facility];  Service: Endoscopy;  Laterality: N/A;  . IR RADIOLOGIST EVAL & MGMT  07/04/2018  . IR RADIOLOGIST EVAL & MGMT  07/18/2018  . LAPAROTOMY N/A 05/19/2018  Procedure: EXPLORATORY LAPAROTOMY RIGHT COLECTOMY;  Surgeon: Fanny Skates, MD;  Location: [facility];  Service: General;  Laterality: N/A;  . LEFT HEART CATH AND CORONARY ANGIOGRAPHY N/A 02/14/2017   Procedure: Left Heart Cath and Coronary Angiography;  Surgeon: Belva Crome, MD;  Location: [place] [facility];  Service: Cardiovascular;  Laterality: N/A;  . LUMBAR SPINE SURGERY  11/2008   Dr Joya Salm  . TEE WITHOUT CARDIOVERSION N/A  02/15/2017   Procedure: TRANSESOPHAGEAL ECHOCARDIOGRAM (TEE);  Surgeon: Grace Isaac, MD;  Location: [place];  Service: Open Heart Surgery;  Laterality: N/A;    I have reviewed the social history and family history with the patient and they are unchanged from previous note.  ALLERGIES:  is allergic to no known allergies.  MEDICATIONS:  Current Outpatient Medications  Medication Sig Dispense Refill  . acetaminophen (TYLENOL) 500 MG tablet Take 1 tablet (500 mg total) by mouth every 6 (six) hours as needed for mild pain.  0  . aspirin EC 81 MG EC tablet Take 1 tablet (81 mg total) by mouth daily. 30 tablet 1  . capecitabine (XELODA) 500 MG tablet take 4 tab (2063m) twice daily after meals, 7 days on and 7 days off 112 tablet 2  . ferrous sulfate 325 (65 FE) MG EC tablet Take 1 tablet (325 mg total) by mouth 3 (three) times daily with meals. (Patient taking differently: Take 325 mg by mouth daily with breakfast. ) 90 tablet 11  . furosemide (LASIX) 40 MG tablet Take 40 mg (1 tab) twice daily for 2 days, then take 40 mg (1 tab) daily 90 tablet 3  . gabapentin (NEURONTIN) 100 MG capsule Take 1 capsule (100 mg total) by mouth 2 (two) times daily. 60 capsule 0  . guaiFENesin (MUCINEX) 600 MG 12 hr tablet Take 600 mg by mouth 2 (two) times daily as needed.     . metoprolol succinate (TOPROL XL) 50 MG 24 hr tablet Take 1 tablet (50 mg total) by mouth daily. Take with or immediately following a meal. 30 tablet 11  . Multiple Vitamin (MULTIVITAMIN WITH MINERALS) TABS tablet Take 1 tablet by mouth daily.    .Marland Kitchenomeprazole (PRILOSEC) 20 MG capsule Take 1 capsule (20 mg total) by mouth daily. 30 capsule 5  . pravastatin (PRAVACHOL) 40 MG tablet Take 1 tablet (40 mg total) by mouth at bedtime. 90 tablet 1  . Probiotic Product (ALIGN PO) Take by mouth.    . traMADol (ULTRAM) 50 MG tablet Take 1 tablet (50 mg total) by mouth every 12 (twelve) hours as needed for moderate pain. 30 tablet 0  . vitamin C  (ASCORBIC ACID) 500 MG tablet Take 500 mg by mouth 2 (two) times daily.     No current facility-administered medications for this visit.     PHYSICAL EXAMINATION: ECOG PERFORMANCE STATUS: 1 - Symptomatic but completely ambulatory  Vitals:   09/06/18 1018  BP: 126/67  Pulse: (!) 55  Resp: 17  Temp: 98.1 F (36.7 C)  SpO2: 99%   Filed Weights   09/06/18 1018  Weight: 204 lb 1.6 oz (92.6 kg)    GENERAL:alert, no distress and comfortable SKIN: no rashes or significant lesions. No palmar erythema  EYES:  sclera clear OROPHARYNX:no thrush or ulcers LYMPH:  no palpable cervical lymphadenopathy LUNGS: rhonchi throughout with normal breathing effort HEART: regular rate & rhythm; no lower extremity edema ABDOMEN:abdomen soft, non-tender and normal bowel sounds. Vertical midline incision is well healed NEURO: alert & oriented x  3 with fluent speech, no focal motor/sensory deficits  LABORATORY DATA:  I have reviewed the data as listed CBC Latest Ref Rng & Units 09/06/2018 08/17/2018 07/24/2018  WBC 4.0 - 10.5 K/uL 6.3 5.0 6.3  Hemoglobin 13.0 - 17.0 g/dL 12.5(L) 11.8(L) 10.7(L)  Hematocrit 39.0 - 52.0 % 39.6 38.1(L) 35.1(L)  Platelets 150 - 400 K/uL 174 198 295     CMP Latest Ref Rng & Units 09/06/2018 08/17/2018 07/24/2018  Glucose 70 - 99 mg/dL 108(H) 100(H) 152(H)  BUN 8 - 23 mg/dL 16 13 10   Creatinine 0.61 - 1.24 mg/dL 1.08 1.00 0.82  Sodium 135 - 145 mmol/L 142 137 140  Potassium 3.5 - 5.1 mmol/L 4.4 3.6 3.6  Chloride 98 - 111 mmol/L 106 104 105  CO2 22 - 32 mmol/L 28 25 26   Calcium 8.9 - 10.3 mg/dL 9.5 9.1 9.1  Total Protein 6.5 - 8.1 g/dL 6.9 6.6 6.5  Total Bilirubin 0.3 - 1.2 mg/dL 0.5 0.5 0.5  Alkaline Phos 38 - 126 U/L 91 84 85  AST 15 - 41 U/L 13(L) 15 17  ALT 0 - 44 U/L 7 10 12    Tumor Markers CEA 07/13/2018: 3.33  PATHOLOGY 05/19/2018 Surgical Pathology Diagnosis Colon, segmental resection for tumor, right - INVASIVE COLORECTAL ADENOCARCINOMA, 5 CM. -  CARCINOMA FOCALLY LESS THAN 0.1 CM FROM SEROSAL SURFACE. - MARGINS NOT INVOLVED. - METASTATIC CARCINOMA IN ONE OF EIGHT LYMPH NODES (1/9). - BENIGN APPENDIX. Microscopic Comment COLON AND RECTUM: Resection, Including Transanal Disk Excision of Rectal Neoplasms Procedure: Right colectomy Tumor Site: Proximal ascending colon Tumor Size: 5 cm Macroscopic Tumor Perforation: No Histologic Type: Colorectal adenocarcinoma. Histologic Grade: Moderately differentiated. Tumor Extension: Into pericolic connective tissue and focally less than 0.1 cm from the serosal surface. Margins: Free of tumor Treatment Effect: No Lymphovascular Invasion: Present Perineural Invasion: Yes Tumor Deposits: No Regional Lymph Nodes: Number of Lymph Nodes Involved: 1 Number of Lymph Nodes Examined: 9 Pathologic Stage Classification (pTNM, AJCC 8th Edition): pT4a, pN1a, see comment. Ancillary Studies: MMR / MSI testing: Pending Representative tumor block: 97F, 1G, 1H and 1X Comments: The adenocarcinoma extends into the pericolonic connective tissue and focally is less than 0.1 cm from the serosal surface and is therefore staged as pT4a. Addendum: The adipose tissue attached to the segment of colon is cleared and one additional benign lymph node is isolated and the oncology table is thus updated. (JDP:kh 05-24-18) (v4.0.1.0)al Information Specimen(s) Obtained: Colon, segmental resection for tumor, right Specimen Clinical Information Obstructed right colon, presumed cancer [rd] Gross Specimen: Received in formalin labeled right colon. Specimen integrity: Intact, with two stapled resection lines. Specimen length: There is 8.0 cm of terminal ileum and 30.5 cm of right colon. Mesorectal intactness: N/A. Tumor location: Within the proximal ascending colon. Tumor size: There is a 5.0 x 4.0 cm tan-red, centrally ulcerated lesion identified with slightly rolled borders. Percent of bowel circumference involved:  100% Tumor distance to margins: Proximal: 16.5 cm. Distal: 11.2 cm. Radial (posterior ascending, posterior descending; lateral and posterior mid-rectum; and entire lower 1/3 rectum): The serosal surface underlying the lesion, areas of puckering involving the surrounding adipose tissue are identified and inked black. The lesion measures approximately 1.0 cm from the radial margin (inked blue). Macroscopic extent of tumor invasion: The tumor invades through the muscularis propria into the subserosal adipose tissue. Total presumed lymph nodes: Fourteen tan-pink possible lymph nodes are identified, ranging from 0.2 cm to 1.5 x 1.0 x 0.6 cm. Extramural satellite tumor nodules: Identified within  the surrounding omentum there is a 1.0 cm in greatest dimension tan, firm nodule identified. Mucosal polyp(s): 4.5 cm distal to the lesion there is a 0.7 cm in greatest dimension tan pink, sessile polyp identified. Additional findings: The mucosa proximal to the lesion is tan-red, hyperemic, with a small amount of tan loosely adherent softened material. The lumen is markedly dilated, and filled with an abundant amount of tan-Dlugosz liquid and partially digested food. The mucosa distal to the lesion is tan pink with normal folding. The appendix is present, and measures 4.3 cm in length x 1.2 cm in diameter. The serosal surface is red-purple and slightly dusky. The mucosa is tan, and the wall measures 0.3 cm in thickness, and the lumen is mildly dilated with a small amount of tan-gray fecal material. Block summary: A,B= proximal resection margin. C,D= distal resection margin. E= mucosal polyp, bisected. F-I= lesion. J= mucosa proximal to lesion. K,L= appendix. M= possible extramural nodule, bisected. N=three possible lymph nodes. O= three possible lymph nodes. P= two possible lymph nodes. Q-U= one possible lymph node bisected, each. V,W= one possible lymph node, serially sectioned. X= tissue for  molecular studies. (K:gt, 05/22/18)   RADIOGRAPHIC STUDIES: I have personally reviewed the radiological images as listed and agreed with the findings in the report. No results found.   ASSESSMENT & PLAN: [name] Z63 y.o.malewithpast medical history of CABG and back surgery, presented with acute abdominal pain and nausea,was found to have colonicbowel obstruction.   1. Adenocarcinoma of right colon, invasive adenocarcinoma, stage IIIB (pT4a, pN1, cM0), MSS -Mr. Tinoco appears stable. He completed 3 cycles adjuvant Xeloda 2000 mg BID 1 week on and 1 week off. He has tolerated treatment well so far, with increased fatigue this last cycle. He has infrequent vomiting, which he attributes to acid reflux. I encouraged him to use tums or zantac PRN, and to sit up for 1 hour after meals.  -he has increased cough and pulmonary congestion, question respiratory/infectious vs cardiac etiology. He had acute exacerbation of CHF in the hospital in 06/2018 and lasix was increased temporarily, now back to 40 mg daily. He has not seen cardiologist since discharge; I encouraged him to make a f/u appt.  -WBC and VS are normal, which is encouraging. Chest xray ordered today shows no signs of pneumonia or effusion. I recommend he continue symptom management for cough, and call if symptoms worsen or he becomes febrile.  -OK to increase lasix to 40 mg AM and 20 mg PM for 2-3 days to see if pulmonary congestion improves.   -I recommend he resume Xeloda 12/2 as anticipated, and continue 1 week on and 1 week off. Will see him back in 2 weeks before next cycle.   2. Anemia, like due to chronic blood loss -On oral iron therapy. Ferritin is normal. Hgb 12.5    3. CAD S/P CABG -continue f/u with PCP  -acute on chronic heart failure exacerbation during 06/2018 hospitalization; he has not seen cardiology since discharge, I recommend he make a f/u appt. He agrees    PLAN: -Lab, chest xray reviewed  -Proceed  with next cycle Xeloda 2000 mg BID 1 week on/1 week off, starting 12/2 -symptom management for cough -Increase lasix to 40 mg AM/20 mg PM x2-3 days -make f/u with cardiology  -f/u in 2 weeks before next cycle    Orders Placed This Encounter  Procedures  . DG Chest 2 View    Standing Status:   Future  Number of Occurrences:   1    Standing Expiration Date:   09/06/2019    Order Specific Question:   Reason for Exam (SYMPTOM  OR DIAGNOSIS REQUIRED)    Answer:   increased cough, on oral chemo    Order Specific Question:   Preferred imaging location?    Answer:   [facility]    Order Specific Question:   Radiology Contrast Protocol - do NOT remove file path    Answer:   [email or web address]   All questions were answered. The patient knows to call the clinic with any problems, questions or concerns. No barriers to learning was detected. I spent 20 minutes counseling the patient face to face. The total time spent in the appointment was 25 minutes and more than 50% was on counseling and review of test results     Alla Feeling, NP 09/08/18

## 2018-09-08 ENCOUNTER — Telehealth: Payer: Self-pay | Admitting: *Deleted

## 2018-09-08 ENCOUNTER — Encounter: Payer: Self-pay | Admitting: Nurse Practitioner

## 2018-09-08 ENCOUNTER — Other Ambulatory Visit: Payer: Self-pay | Admitting: Medical

## 2018-09-08 ENCOUNTER — Telehealth: Payer: Self-pay | Admitting: Nurse Practitioner

## 2018-09-08 MED ORDER — OMEPRAZOLE 20 MG PO CPDR: 20.0000 mg | DELAYED_RELEASE_CAPSULE | Freq: Every day | ORAL | 5 refills | Status: DC

## 2018-09-08 MED FILL — CAPECITABINE 500 MG TABS: 500 | 28 days supply | Qty: 112 | Fill #1

## 2018-09-08 NOTE — Telephone Encounter (Signed)
Called pt and spoke with wife [name].  Informed her of Lacie, NP instructions below.  [name] voiced understanding.

## 2018-09-08 NOTE — Telephone Encounter (Signed)
No los per 11/27

## 2018-09-08 NOTE — Telephone Encounter (Signed)
-----   Message from Alla Feeling, NP sent at 09/08/2018  2:48 PM EST ----- Vincent Mcclure, can you please call him and let him know chest xray does not show pneumonia or pleural effusions. Continue symptom management for his cough. Call for fever or worsening cough/sputum. OK to increase lasix to 40 mg AM and 20 mg (0.5 tab) PM for 2-3 days to see if respiratory congestion improves. Then resume usual once daily dosing. I still recommend cardiology f/u as we discussed Wednesday. Start Xeloda Monday as regularly scheduled. I will send a schedule message to see him back during his off week in 2 weeks.  Thanks, lacie

## 2018-09-11 ENCOUNTER — Other Ambulatory Visit: Payer: Self-pay | Admitting: Physician Assistant

## 2018-09-12 ENCOUNTER — Telehealth: Payer: Self-pay | Admitting: Hematology

## 2018-09-12 NOTE — Telephone Encounter (Signed)
Scheduled appt per 12/2 sch messag e- pt is aware of appt date and time

## 2018-09-20 ENCOUNTER — Other Ambulatory Visit: Payer: Self-pay

## 2018-09-20 ENCOUNTER — Telehealth: Payer: Self-pay

## 2018-09-20 ENCOUNTER — Inpatient Hospital Stay: Payer: Medicare HMO

## 2018-09-20 ENCOUNTER — Encounter: Payer: Self-pay | Admitting: Hematology

## 2018-09-20 ENCOUNTER — Inpatient Hospital Stay: Payer: Medicare HMO | Attending: Nurse Practitioner | Admitting: Hematology

## 2018-09-20 VITALS — BP 132/73 | HR 82 | Temp 97.8°F | Resp 17 | Ht 71.0 in | Wt 207.4 lb

## 2018-09-20 DIAGNOSIS — D649 Anemia, unspecified: Secondary | ICD-10-CM | POA: Insufficient documentation

## 2018-09-20 DIAGNOSIS — G629 Polyneuropathy, unspecified: Secondary | ICD-10-CM | POA: Insufficient documentation

## 2018-09-20 DIAGNOSIS — C182 Malignant neoplasm of ascending colon: Secondary | ICD-10-CM | POA: Diagnosis not present

## 2018-09-20 DIAGNOSIS — I2510 Atherosclerotic heart disease of native coronary artery without angina pectoris: Secondary | ICD-10-CM | POA: Insufficient documentation

## 2018-09-20 LAB — CBC WITH DIFFERENTIAL (CANCER CENTER ONLY)
ABS IMMATURE GRANULOCYTES: 0.01 10*3/uL (ref 0.00–0.07)
BASOS PCT: 0 %
Basophils Absolute: 0 10*3/uL (ref 0.0–0.1)
EOS PCT: 2 %
Eosinophils Absolute: 0.1 10*3/uL (ref 0.0–0.5)
HEMATOCRIT: 38.4 % — AB (ref 39.0–52.0)
HEMOGLOBIN: 12.6 g/dL — AB (ref 13.0–17.0)
Immature Granulocytes: 0 %
LYMPHS ABS: 1.8 10*3/uL (ref 0.7–4.0)
LYMPHS PCT: 38 %
MCH: 27.9 pg (ref 26.0–34.0)
MCHC: 32.8 g/dL (ref 30.0–36.0)
MCV: 85 fL (ref 80.0–100.0)
Monocytes Absolute: 0.5 10*3/uL (ref 0.1–1.0)
Monocytes Relative: 11 %
NEUTROS PCT: 49 %
Neutro Abs: 2.3 10*3/uL (ref 1.7–7.7)
PLATELETS: 199 10*3/uL (ref 150–400)
RBC: 4.52 MIL/uL (ref 4.22–5.81)
RDW: 18.8 % — AB (ref 11.5–15.5)
WBC: 4.7 10*3/uL (ref 4.0–10.5)
nRBC: 0 % (ref 0.0–0.2)

## 2018-09-20 LAB — CMP (CANCER CENTER ONLY)
ALBUMIN: 3.5 g/dL (ref 3.5–5.0)
ALK PHOS: 92 U/L (ref 38–126)
ALT: <6 U/L (ref 0–44)
AST: 11 U/L — ABNORMAL LOW (ref 15–41)
Anion gap: 9 (ref 5–15)
BILIRUBIN TOTAL: 0.3 mg/dL (ref 0.3–1.2)
BUN: 15 mg/dL (ref 8–23)
CALCIUM: 9.3 mg/dL (ref 8.9–10.3)
CO2: 23 mmol/L (ref 22–32)
CREATININE: 1.07 mg/dL (ref 0.61–1.24)
Chloride: 107 mmol/L (ref 98–111)
GFR, Estimated: >60 mL/min (ref >60)
Glucose, Bld: 135 mg/dL — ABNORMAL HIGH (ref 70–99)
POTASSIUM: 3.9 mmol/L (ref 3.5–5.1)
Sodium: 139 mmol/L (ref 135–145)
Total Protein: 6.6 g/dL (ref 6.5–8.1)

## 2018-09-20 MED ORDER — GABAPENTIN 100 MG PO CAPS: 100.0000 mg | ORAL_CAPSULE | Freq: Two times a day (BID) | ORAL | 2 refills | Status: DC

## 2018-09-20 NOTE — Progress Notes (Signed)
Vincent Mcclure   [phone] [phone]   Clinic Follow up Note   Patient Care Team: [name], DO as PCP - General (Family Medicine) Pyrtle, Lajuan Lines, MD as Consulting Physician (Gastroenterology) Allyn Kenner, MD as Consulting Physician (Dermatology) Dorothy Spark, MD as Consulting Physician (Cardiology) Levin Erp, [place] as Physician Assistant (Gastroenterology) Murrell Converse as Physician Assistant (Cardiology)  Date of Service:  09/20/2018  CHIEF COMPLAINT: F/u of colon cancer  SUMMARY OF ONCOLOGIC HISTORY: Oncology History   Cancer Staging Cancer of ascending colon pT4apN1 s/p right colectomy 05/19/2018 Staging form: Colon and Rectum, AJCC 8th Edition - Pathologic stage from 05/19/2018: Stage IIIB (pT4a, pN1, cM0) - Signed by Truitt Merle, MD on 05/24/2018       Cancer of ascending colon pT4apN1 s/p right colectomy 05/19/2018   05/18/2018 Imaging    05/18/2018 CT AP IMPRESSION: 1. There appears to be a nearly obstructing lesion in the region of the hepatic flexure of the colon which is highly concerning for primary colonic neoplasm. Slight haziness of the surrounding soft tissues may be indicative of early local invasion. This appears partially obstructive as evidenced by dilatation of more proximal aspects of the small bowel and colon. 2. Left adrenal lesion is stable compared to prior studies, previously characterized as an adenoma. 3. Aortic atherosclerosis, in addition to left main and 3 vessel coronary artery disease. Status post median sternotomy for CABG. 4. Additional incidental findings, as above.  Aortic Atherosclerosis (ICD10-I70.0).    05/18/2018 - 05/30/2018 Hospital Admission    Admit date: 05/18/2018 Admission diagnosis: Colon Cancer Discharged on: 05/30/2018    05/19/2018 Cancer Staging    Staging form: Colon and Rectum, AJCC 8th Edition - Pathologic stage from 05/19/2018: Stage IIIB (pT4a, pN1, cM0) - Signed  by Truitt Merle, MD on 05/24/2018     05/19/2018 Pathology Results    05/19/2018 Surgical Pathology Diagnosis Colon, segmental resection for tumor, right - INVASIVE COLORECTAL ADENOCARCINOMA, 5 CM. - CARCINOMA FOCALLY LESS THAN 0.1 CM FROM SEROSAL SURFACE. - MARGINS NOT INVOLVED. - METASTATIC CARCINOMA IN ONE OF EIGHT LYMPH NODES (1/9). - BENIGN APPENDIX.    05/19/2018 Surgery    EXPLORATORY LAPAROTOMY with right colectomy with Fanny Skates, MD    05/24/2018 Initial Diagnosis    Cancer of right colon ([place])    06/12/2018 Imaging     06/12/2018 CT CAP IMPRESSION: 1. Evidence all bowel perforation/anastomosed breakdown with large volume intraperitoneal free air fluid in the RIGHT upper quadrant adjacent and above the liver. 2. Large loculated fluid collection in the RIGHT anterior pelvis extending along the LEFT pericolic gutter consists with PERITONEAL ABSCESS. 3. Of note, no large volume intraperitoneal free fluid. Oral contrast passed through the RIGHT colon anastomosis without evidence of leak. Findings could indicate a prior anastomotic breakdown which has healed in the interval with subsequent abscess formation from the prior leak / breakdown.    06/17/2018 Procedure    06/17/2018 US Thoracentesis IMPRESSION: Successful ultrasound guided right thoracentesis yielding 1 L of pleural fluid. No pneumothorax on post-procedure chest x-ray.  No malignancy on pathology    06/20/2018 Imaging    06/20/2018 CT AP IMPRESSION: 1. Decompression of right upper quadrant air and fluid collection after percutaneous catheter drainage. There remains a tiny rim of fluid as well as small extraluminal gas bubbles in this region suspicious for continued leak from bowel. 2. Further decrease in size of residual abscess fluid collection in the pelvis after percutaneous catheter drainage. There  remains some fluid around the drainage catheter in the right pelvis.    07/04/2018 Imaging    07/04/2018 CT  AP IMPRESSION: 1. Further decompression of right perihepatic abscess with resolution of extraluminal air. 2. Relatively stable volume of fluid surrounding the right pelvic drainage catheter.    07/05/2018 Imaging    07/05/2018 CT AP IMPRESSION: 1. No change compared to the CT scan performed 9 hours earlier. 2. Anterior right perihepatic space collection is decompressed by indwelling percutaneous drain, with no residual measurable collection in this location. 3. Thick walled right lower quadrant collection with indwelling percutaneous drain is stable. No new focal fluid collections. 4. Stable postsurgical changes from right hemicolectomy with no evidence of bowel obstruction. Stable reactive wall thickening in the sigmoid colon adjacent to the right lower quadrant collection. 5. Stable small to moderate dependent right pleural effusion. 6. Stable left adrenal adenoma. 7.  Aortic Atherosclerosis (ICD10-I70.0).    07/31/2018 -  Chemotherapy    Adjuvant Xeloda 2000mg  bid, 7 days on, 7 days of       CURRENT THERAPY:  Xeloda 2000mg  BID one week on, one week offstarted 10/21//2019   INTERVAL HISTORY:  Vincent Mcclure is here for a follow up. He presents to the clinic today with her wife. He is doing well. He notes he is tolerating treatment well but has been fatigued. This past week he notes feeling weak for the first time. For his off week he feels mostly normal. He notes having fungal infection on nails. He has been taking antifungal cream. He is not sure if this started before or after Xeloda.  He has burning in his feet in the evening occasionally. He takes Gabapentin and would like a refill.     REVIEW OF SYSTEMS:   Constitutional: Denies fevers, chills or abnormal weight loss (+) fatigue  Eyes: Denies blurriness of vision Ears, nose, mouth, throat, and face: Denies mucositis or sore throat Respiratory: Denies cough, dyspnea or wheezes Cardiovascular: Denies palpitation,  chest discomfort or lower extremity swelling Gastrointestinal:  Denies nausea, heartburn or change in bowel habits Skin: Denies abnormal skin rashes (+) erythema of nail bed Lymphatics: Denies new lymphadenopathy or easy bruising Neurological:Denies numbness, tingling or new weaknesses Behavioral/Psych: Mood is stable, no new changes  All other systems were reviewed with the patient and are negative.  MEDICAL HISTORY:  Past Medical History:  Diagnosis Date  . Alcohol abuse   . Anemia   . Chronic low back pain 10/14/2011  . Colon polyps    hyperplastic (2004, 2010) and adenomatous (1990).    . COLONIC POLYPS, HX OF 03/05/2008  . Esophageal stricture    hx of  . Gastric ulcer   . GERD 03/05/2008  . GERD (gastroesophageal reflux disease) 1994   associated peptic strictures  . HYPERLIPIDEMIA 03/05/2008  . HYPERTENSION 03/05/2008  . Hypertension   . IBS 03/05/2008  . Impaired glucose tolerance 10/12/2011  . Iron deficiency anemia   . LEG CRAMPS 09/02/2010  . PAD (peripheral artery disease) ([place]) 10/14/2011  . PAD (peripheral artery disease) ([place]) 2013  . PERIPHERAL EDEMA 09/02/2010  . PERIPHERAL NEUROPATHY 03/05/2008  . Prostatitis    hx of  . VARICOSE VEINS, LOWER EXTREMITIES 06/09/2009    SURGICAL HISTORY: Past Surgical History:  Procedure Laterality Date  . CATARACT EXTRACTION  bilat  . COLECTOMY    . CORONARY ARTERY BYPASS GRAFT N/A 02/15/2017   Procedure: CORONARY ARTERY BYPASS GRAFTING times three  with left internal mammary harvest and endoscopic  harvest of Right SVG. Grafts of LIMA to  LAD, SVG to Distal Circ, and to First Diag.;  Surgeon: Grace Isaac, MD;  Location: [facility];  Service: Open Heart Surgery;  Laterality: N/A;  . ESOPHAGOGASTRODUODENOSCOPY N/A 11/14/2014   Procedure: ESOPHAGOGASTRODUODENOSCOPY (EGD);  Surgeon: Jerene Bears, MD;  Location: [facility] ENDOSCOPY;  Service: Endoscopy;  Laterality: N/A;  . IR RADIOLOGIST EVAL & MGMT  07/04/2018  . IR RADIOLOGIST EVAL & MGMT   07/18/2018  . LAPAROTOMY N/A 05/19/2018   Procedure: EXPLORATORY LAPAROTOMY RIGHT COLECTOMY;  Surgeon: Fanny Skates, MD;  Location: [facility];  Service: General;  Laterality: N/A;  . LEFT HEART CATH AND CORONARY ANGIOGRAPHY N/A 02/14/2017   Procedure: Left Heart Cath and Coronary Angiography;  Surgeon: Belva Crome, MD;  Location: [place] [facility];  Service: Cardiovascular;  Laterality: N/A;  . LUMBAR SPINE SURGERY  11/2008   Dr Joya Salm  . TEE WITHOUT CARDIOVERSION N/A 02/15/2017   Procedure: TRANSESOPHAGEAL ECHOCARDIOGRAM (TEE);  Surgeon: Grace Isaac, MD;  Location: [place];  Service: Open Heart Surgery;  Laterality: N/A;    I have reviewed the social history and family history with the patient and they are unchanged from previous note.  ALLERGIES:  is allergic to no known allergies.  MEDICATIONS:  Current Outpatient Medications  Medication Sig Dispense Refill  . acetaminophen (TYLENOL) 500 MG tablet Take 1 tablet (500 mg total) by mouth every 6 (six) hours as needed for mild pain.  0  . aspirin EC 81 MG EC tablet Take 1 tablet (81 mg total) by mouth daily. 30 tablet 1  . capecitabine (XELODA) 500 MG tablet take 4 tab (2000mg ) twice daily after meals, 7 days on and 7 days off 112 tablet 2  . ferrous sulfate 325 (65 FE) MG EC tablet Take 1 tablet (325 mg total) by mouth 3 (three) times daily with meals. (Patient taking differently: Take 325 mg by mouth daily with breakfast. ) 90 tablet 11  . furosemide (LASIX) 40 MG tablet Take 40 mg (1 tab) twice daily for 2 days, then take 40 mg (1 tab) daily 90 tablet 3  . gabapentin (NEURONTIN) 100 MG capsule Take 1 capsule (100 mg total) by mouth 2 (two) times daily. 60 capsule 0  . guaiFENesin (MUCINEX) 600 MG 12 hr tablet Take 600 mg by mouth 2 (two) times daily as needed.     . metoprolol succinate (TOPROL-XL) 50 MG 24 hr tablet TAKE 1 TABLET BY MOUTH ONCE DAILY. TAKE WITH OR IMMEDIATELY FOLLOWING A MEAL. 30 tablet 0  . Multiple Vitamin  (MULTIVITAMIN WITH MINERALS) TABS tablet Take 1 tablet by mouth daily.    . pravastatin (PRAVACHOL) 40 MG tablet Take 1 tablet (40 mg total) by mouth at bedtime. 90 tablet 1  . Probiotic Product (ALIGN PO) Take by mouth.    . traMADol (ULTRAM) 50 MG tablet Take 1 tablet (50 mg total) by mouth every 12 (twelve) hours as needed for moderate pain. 30 tablet 0  . vitamin C (ASCORBIC ACID) 500 MG tablet Take 500 mg by mouth 2 (two) times daily.     No current facility-administered medications for this visit.     PHYSICAL EXAMINATION: ECOG PERFORMANCE STATUS: 1 - Symptomatic but completely ambulatory  Vitals:   09/20/18 0944  BP: 132/73  Pulse: 82  Resp: 17  Temp: 97.8 F (36.6 C)  SpO2: 97%   Filed Weights   09/20/18 0944  Weight: 207 lb 6.4 oz (94.1 kg)  GENERAL:alert, no distress and comfortable SKIN: skin color, texture, turgor are normal, no rashes or significant lesions EYES: normal, Conjunctiva are pink and non-injected, sclera clear OROPHARYNX:no exudate, no erythema and lips, buccal mucosa, and tongue normal  NECK: supple, thyroid normal size, non-tender, without nodularity LYMPH:  no palpable lymphadenopathy in the cervical, axillary or inguinal LUNGS: clear to auscultation and percussion with normal breathing effort HEART: regular rate & rhythm and no murmurs and no lower extremity edema ABDOMEN:abdomen soft, non-tender and normal bowel sounds Musculoskeletal:no cyanosis of digits and no clubbing  NEURO: alert & oriented x 3 with fluent speech, no focal motor/sensory deficits  LABORATORY DATA:  I have reviewed the data as listed CBC Latest Ref Rng & Units 09/20/2018 09/06/2018 08/17/2018  WBC 4.0 - 10.5 K/uL 4.7 6.3 5.0  Hemoglobin 13.0 - 17.0 g/dL 12.6(L) 12.5(L) 11.8(L)  Hematocrit 39.0 - 52.0 % 38.4(L) 39.6 38.1(L)  Platelets 150 - 400 K/uL 199 174 198     CMP Latest Ref Rng & Units 09/06/2018 08/17/2018 07/24/2018  Glucose 70 - 99 mg/dL 108(H) 100(H) 152(H)    BUN 8 - 23 mg/dL 16 13 10   Creatinine 0.61 - 1.24 mg/dL 1.08 1.00 0.82  Sodium 135 - 145 mmol/L 142 137 140  Potassium 3.5 - 5.1 mmol/L 4.4 3.6 3.6  Chloride 98 - 111 mmol/L 106 104 105  CO2 22 - 32 mmol/L 28 25 26   Calcium 8.9 - 10.3 mg/dL 9.5 9.1 9.1  Total Protein 6.5 - 8.1 g/dL 6.9 6.6 6.5  Total Bilirubin 0.3 - 1.2 mg/dL 0.5 0.5 0.5  Alkaline Phos 38 - 126 U/L 91 84 85  AST 15 - 41 U/L 13(L) 15 17  ALT 0 - 44 U/L 7 10 12       RADIOGRAPHIC STUDIES: I have personally reviewed the radiological images as listed and agreed with the findings in the report. No results found.   ASSESSMENT & PLAN:  AEDIN JEANSONNE is a 84 y.o. male with    1. Adenocarcinoma of right colon, invasive adenocarcinoma, stage IIIB (pT4a, pN1, cM0), MSS -He was diagnosed in 05/2018. He is s/p hemicolectomy and currently being treated with adjuvant Xeloda 2000mg  BID since 07/31/18. He is one week on and one week off, had 4 cycles so far  -He is tolerating moderately well with mild fatigue and mild skin toxicity. Will continue for 3-6 months as he tolerates.  -I plans to do surveillance CT scan after he completes adjuvant chemo -Labs reviewed, Hg at 12.6. CMP is still pending.  -Proceed with next cycle Xeloda next week.  -F/u in 4 weeks, he knows to call me if he has worsening side effects.  2. Anemia, like due to chronic blood loss -He is on oral iron -will monitor  -Hb improved to 12.6 (09/20/18)  3. CAD S/P CABG -continue f/u with PCP   4. Neuropathy in feet  -On Gabapentin. I discussed he can increase dose as needed. I refilled today   PLAN: -I refilled his gabapentin -Labs revewied and adequate to proceed with Xeloda starting next week at same dose  -Lab and f/u in 4 weeks      No problem-specific Assessment & Plan notes found for this encounter.   No orders of the defined types were placed in this encounter.  All questions were answered. The patient knows to call the clinic with any  problems, questions or concerns. No barriers to learning was detected. I spent 15 minutes counseling the patient face to face.  The total time spent in the appointment was 20 minutes and more than 50% was on counseling and review of test results     Truitt Merle, MD 09/20/2018   I, Joslyn Devon, am acting as scribe for Truitt Merle, MD.   I have reviewed the above documentation for accuracy and completeness, and I agree with the above.

## 2018-09-20 NOTE — Telephone Encounter (Signed)
Printed avs and calender of upcoming appointment. Per 12/11 los

## 2018-09-21 ENCOUNTER — Encounter: Payer: Self-pay | Admitting: Hematology

## 2018-09-29 MED FILL — CAPECITABINE 500 MG TABS: 500 | 28 days supply | Qty: 112 | Fill #2

## 2018-10-05 NOTE — Progress Notes (Signed)
[place]   [phone] [phone]   Clinic Follow up Note   Patient Care Team: [name], DO as PCP - General (Family Medicine) Pyrtle, Lajuan Lines, MD as Consulting Physician (Gastroenterology) Allyn Kenner, MD as Consulting Physician (Dermatology) Dorothy Spark, MD as Consulting Physician (Cardiology) Levin Erp, [place] as Physician Assistant (Gastroenterology) Murrell Converse as Physician Assistant (Cardiology) 10/17/2018  CHIEF COMPLAINT: F/u on stage III colon cancer   SUMMARY OF ONCOLOGIC HISTORY: Oncology History   Cancer Staging Cancer of ascending colon pT4apN1 s/p right colectomy 05/19/2018 Staging form: Colon and Rectum, AJCC 8th Edition - Pathologic stage from 05/19/2018: Stage IIIB (pT4a, pN1, cM0) - Signed by Truitt Merle, MD on 05/24/2018       Cancer of ascending colon pT4apN1 s/p right colectomy 05/19/2018   05/18/2018 Imaging    05/18/2018 CT AP IMPRESSION: 1. There appears to be a nearly obstructing lesion in the region of the hepatic flexure of the colon which is highly concerning for primary colonic neoplasm. Slight haziness of the surrounding soft tissues may be indicative of early local invasion. This appears partially obstructive as evidenced by dilatation of more proximal aspects of the small bowel and colon. 2. Left adrenal lesion is stable compared to prior studies, previously characterized as an adenoma. 3. Aortic atherosclerosis, in addition to left main and 3 vessel coronary artery disease. Status post median sternotomy for CABG. 4. Additional incidental findings, as above.  Aortic Atherosclerosis (ICD10-I70.0).    05/18/2018 - 05/30/2018 Hospital Admission    Admit date: 05/18/2018 Admission diagnosis: Colon Cancer Discharged on: 05/30/2018    05/19/2018 Cancer Staging    Staging form: Colon and Rectum, AJCC 8th Edition - Pathologic stage from 05/19/2018: Stage IIIB (pT4a, pN1, cM0) - Signed by Truitt Merle, MD on 05/24/2018     05/19/2018 Pathology Results    05/19/2018 Surgical Pathology Diagnosis Colon, segmental resection for tumor, right - INVASIVE COLORECTAL ADENOCARCINOMA, 5 CM. - CARCINOMA FOCALLY LESS THAN 0.1 CM FROM SEROSAL SURFACE. - MARGINS NOT INVOLVED. - METASTATIC CARCINOMA IN ONE OF EIGHT LYMPH NODES (1/9). - BENIGN APPENDIX.    05/19/2018 Surgery    EXPLORATORY LAPAROTOMY with right colectomy with Fanny Skates, MD    05/24/2018 Initial Diagnosis    Cancer of right colon ([place])    06/12/2018 Imaging     06/12/2018 CT CAP IMPRESSION: 1. Evidence all bowel perforation/anastomosed breakdown with large volume intraperitoneal free air fluid in the RIGHT upper quadrant adjacent and above the liver. 2. Large loculated fluid collection in the RIGHT anterior pelvis extending along the LEFT pericolic gutter consists with PERITONEAL ABSCESS. 3. Of note, no large volume intraperitoneal free fluid. Oral contrast passed through the RIGHT colon anastomosis without evidence of leak. Findings could indicate a prior anastomotic breakdown which has healed in the interval with subsequent abscess formation from the prior leak / breakdown.    06/17/2018 Procedure    06/17/2018 US Thoracentesis IMPRESSION: Successful ultrasound guided right thoracentesis yielding 1 L of pleural fluid. No pneumothorax on post-procedure chest x-ray.  No malignancy on pathology    06/20/2018 Imaging    06/20/2018 CT AP IMPRESSION: 1. Decompression of right upper quadrant air and fluid collection after percutaneous catheter drainage. There remains a tiny rim of fluid as well as small extraluminal gas bubbles in this region suspicious for continued leak from bowel. 2. Further decrease in size of residual abscess fluid collection in the pelvis after percutaneous catheter drainage. There remains some  fluid around the drainage catheter in the right pelvis.    07/04/2018 Imaging    07/04/2018 CT  AP IMPRESSION: 1. Further decompression of right perihepatic abscess with resolution of extraluminal air. 2. Relatively stable volume of fluid surrounding the right pelvic drainage catheter.    07/05/2018 Imaging    07/05/2018 CT AP IMPRESSION: 1. No change compared to the CT scan performed 9 hours earlier. 2. Anterior right perihepatic space collection is decompressed by indwelling percutaneous drain, with no residual measurable collection in this location. 3. Thick walled right lower quadrant collection with indwelling percutaneous drain is stable. No new focal fluid collections. 4. Stable postsurgical changes from right hemicolectomy with no evidence of bowel obstruction. Stable reactive wall thickening in the sigmoid colon adjacent to the right lower quadrant collection. 5. Stable small to moderate dependent right pleural effusion. 6. Stable left adrenal adenoma. 7.  Aortic Atherosclerosis (ICD10-I70.0).    07/31/2018 -  Chemotherapy    Adjuvant Xeloda 2000mg  bid, 7 days on, 7 days of      CURRENT THERAPY Xeloda 2000mg  BID one week on, one week offstarted 10/21//2019   INTERVAL HISTORY: Vincent Mcclure is a 84 y.o. male who is here for follow-up. Today, he is here alone. He is doing well, but bruises easily and noticed nail changes. He also noticed that he lost his fingerprint as his palms are smoother now. He also experienced diarrhea with Xeloda that can happen up to 8 times a day. He has not tired imodium. He had one loose bowel movement today.   Pertinent positives and negatives of review of systems are listed and detailed within the above HPI.  REVIEW OF SYSTEMS:   Constitutional: Denies fevers, chills or abnormal weight loss Eyes: Denies blurriness of vision Ears, nose, mouth, throat, and face: Denies mucositis or sore throat Respiratory: Denies cough, dyspnea or wheezes Cardiovascular: Denies palpitation, chest discomfort or lower extremity  swelling Gastrointestinal:  Denies nausea, heartburn (+) diarrhea  Skin: Denies abnormal skin rashes (+) smooth palms  Lymphatics: Denies new lymphadenopathy or easy bruising Neurological:Denies numbness, tingling or new weaknesses Behavioral/Psych: Mood is stable, no new changes  All other systems were reviewed with the patient and are negative.  MEDICAL HISTORY:  Past Medical History:  Diagnosis Date  . Alcohol abuse   . Anemia   . Chronic low back pain 10/14/2011  . Colon polyps    hyperplastic (2004, 2010) and adenomatous (1990).    . COLONIC POLYPS, HX OF 03/05/2008  . Esophageal stricture    hx of  . Gastric ulcer   . GERD 03/05/2008  . GERD (gastroesophageal reflux disease) 1994   associated peptic strictures  . HYPERLIPIDEMIA 03/05/2008  . HYPERTENSION 03/05/2008  . Hypertension   . IBS 03/05/2008  . Impaired glucose tolerance 10/12/2011  . Iron deficiency anemia   . LEG CRAMPS 09/02/2010  . PAD (peripheral artery disease) ([place]) 10/14/2011  . PAD (peripheral artery disease) ([place]) 2013  . PERIPHERAL EDEMA 09/02/2010  . PERIPHERAL NEUROPATHY 03/05/2008  . Prostatitis    hx of  . VARICOSE VEINS, LOWER EXTREMITIES 06/09/2009    SURGICAL HISTORY: Past Surgical History:  Procedure Laterality Date  . CATARACT EXTRACTION  bilat  . COLECTOMY    . CORONARY ARTERY BYPASS GRAFT N/A 02/15/2017   Procedure: CORONARY ARTERY BYPASS GRAFTING times three  with left internal mammary harvest and endoscopic harvest of Right SVG. Grafts of LIMA to  LAD, SVG to Distal Circ, and to First Diag.;  Surgeon: Servando Snare,  Lilia Argue, MD;  Location: [facility];  Service: Open Heart Surgery;  Laterality: N/A;  . ESOPHAGOGASTRODUODENOSCOPY N/A 11/14/2014   Procedure: ESOPHAGOGASTRODUODENOSCOPY (EGD);  Surgeon: Jerene Bears, MD;  Location: [facility];  Service: Endoscopy;  Laterality: N/A;  . IR RADIOLOGIST EVAL & MGMT  07/04/2018  . IR RADIOLOGIST EVAL & MGMT  07/18/2018  . LAPAROTOMY N/A 05/19/2018   Procedure:  EXPLORATORY LAPAROTOMY RIGHT COLECTOMY;  Surgeon: Fanny Skates, MD;  Location: [facility];  Service: General;  Laterality: N/A;  . LEFT HEART CATH AND CORONARY ANGIOGRAPHY N/A 02/14/2017   Procedure: Left Heart Cath and Coronary Angiography;  Surgeon: Belva Crome, MD;  Location: [facility];  Service: Cardiovascular;  Laterality: N/A;  . LUMBAR SPINE SURGERY  11/2008   Dr Joya Salm  . TEE WITHOUT CARDIOVERSION N/A 02/15/2017   Procedure: TRANSESOPHAGEAL ECHOCARDIOGRAM (TEE);  Surgeon: Grace Isaac, MD;  Location: [facility];  Service: Open Heart Surgery;  Laterality: N/A;    I have reviewed the social history and family history with the patient and they are unchanged from previous note.  ALLERGIES:  is allergic to no known allergies.  MEDICATIONS:  Current Outpatient Medications  Medication Sig Dispense Refill  . acetaminophen (TYLENOL) 500 MG tablet Take 1 tablet (500 mg total) by mouth every 6 (six) hours as needed for mild pain.  0  . aspirin EC 81 MG EC tablet Take 1 tablet (81 mg total) by mouth daily. 30 tablet 1  . capecitabine (XELODA) 500 MG tablet take 4 tab (2000mg ) twice daily after meals, 7 days on and 7 days off 112 tablet 2  . ferrous sulfate 325 (65 FE) MG EC tablet Take 1 tablet (325 mg total) by mouth 3 (three) times daily with meals. (Patient taking differently: Take 325 mg by mouth daily with breakfast. ) 90 tablet 11  . furosemide (LASIX) 40 MG tablet Take 40 mg (1 tab) twice daily for 2 days, then take 40 mg (1 tab) daily 90 tablet 3  . gabapentin (NEURONTIN) 100 MG capsule Take 1 capsule (100 mg total) by mouth 2 (two) times daily. 60 capsule 2  . guaiFENesin (MUCINEX) 600 MG 12 hr tablet Take 600 mg by mouth 2 (two) times daily as needed.     . metoprolol succinate (TOPROL-XL) 50 MG 24 hr tablet TAKE 1 TABLET BY MOUTH ONCE DAILY. TAKE WITH OR IMMEDIATELY FOLLOWING A MEAL. 30 tablet 0  . Multiple Vitamin (MULTIVITAMIN WITH MINERALS) TABS tablet Take 1 tablet by mouth  daily.    . pravastatin (PRAVACHOL) 40 MG tablet Take 1 tablet (40 mg total) by mouth at bedtime. 90 tablet 1  . Probiotic Product (ALIGN PO) Take by mouth.    . traMADol (ULTRAM) 50 MG tablet Take 1 tablet (50 mg total) by mouth every 12 (twelve) hours as needed for moderate pain. 30 tablet 0  . vitamin C (ASCORBIC ACID) 500 MG tablet Take 500 mg by mouth 2 (two) times daily.     No current facility-administered medications for this visit.     PHYSICAL EXAMINATION: ECOG PERFORMANCE STATUS: 1 - Symptomatic but completely ambulatory  Vitals:   10/17/18 1301  BP: (!) 141/80  Pulse: 88  Resp: 18  Temp: 98 F (36.7 C)  SpO2: 99%   Filed Weights   10/17/18 1301  Weight: 209 lb 12.8 oz (95.2 kg)    GENERAL:alert, no distress and comfortable (+) hard of hearing  SKIN: skin color, texture, turgor are normal, no rashes  or significant lesions EYES: normal, Conjunctiva are pink and non-injected, sclera clear OROPHARYNX:no exudate, no erythema and lips, buccal mucosa, and tongue normal  NECK: supple, thyroid normal size, non-tender, without nodularity LYMPH:  no palpable lymphadenopathy in the cervical, axillary or inguinal LUNGS: clear to auscultation and percussion with normal breathing effort HEART: regular rate & rhythm and no murmurs and no lower extremity edema ABDOMEN:abdomen soft, non-tender and normal bowel sounds (+) abdominal incision healing well Musculoskeletal:no cyanosis of digits and no clubbing  NEURO: alert & oriented x 3 with fluent speech, no focal motor/sensory deficits  LABORATORY DATA:  I have reviewed the data as listed CBC Latest Ref Rng & Units 10/17/2018 09/20/2018 09/06/2018  WBC 4.0 - 10.5 K/uL 4.6 4.7 6.3  Hemoglobin 13.0 - 17.0 g/dL 13.0 12.6(L) 12.5(L)  Hematocrit 39.0 - 52.0 % 40.0 38.4(L) 39.6  Platelets 150 - 400 K/uL 182 199 174     CMP Latest Ref Rng & Units 10/17/2018 09/20/2018 09/06/2018  Glucose 70 - 99 mg/dL 102(H) 135(H) 108(H)  BUN 8 - 23  mg/dL 19 15 16   Creatinine 0.61 - 1.24 mg/dL 1.08 1.07 1.08  Sodium 135 - 145 mmol/L 138 139 142  Potassium 3.5 - 5.1 mmol/L 4.2 3.9 4.4  Chloride 98 - 111 mmol/L 105 107 106  CO2 22 - 32 mmol/L 21(L) 23 28  Calcium 8.9 - 10.3 mg/dL 9.6 9.3 9.5  Total Protein 6.5 - 8.1 g/dL 7.0 6.6 6.9  Total Bilirubin 0.3 - 1.2 mg/dL 0.6 0.3 0.5  Alkaline Phos 38 - 126 U/L 90 92 91  AST 15 - 41 U/L 13(L) 11(L) 13(L)  ALT 0 - 44 U/L 9 <6 7      RADIOGRAPHIC STUDIES: I have personally reviewed the radiological images as listed and agreed with the findings in the report. No results found.   ASSESSMENT & PLAN:  ASHYR HEDGEPATH is a 84 y.o. male with history of  1. Adenocarcinoma of right colon, invasive adenocarcinoma, stage IIIB (pT4a, pN1, cM0), MSS -Diagnosed in 05/2018. Treated with hemicolectomy. He is currently on adjuvant Xeloda 2000 mg BID one week on, one week off since 10.21.2019. Plan for 6 months, he completed 5 cycles. Tolerating well with mild fatigue and skin dryness. -Labs reviewed, CBC is WNLs. CMP, CEA and ferritin pending.  -He is clinically stable and is doing well. He experienced mild intermittent diarrhea with Xeloda. I advised him to use imodium. -he will start cycle 6 next Monday  -f/u in 4 weeks   2. Anemia, due to chronic blood loss and iron deficiency  -He is currently on oral iron. Continue. -Labs reviewed, CBC showed Hg 13.0, improved   3. CAD S/P CABG -f/u with PCP -stable   4. Neuropathy in feet  -Currently on Gabapentin. Continue.    Plan  -He will start cycle 6 Xeloda next Monday 1/13 -f/u in 4 weeks with labs  -refilled Xeloda   No problem-specific Assessment & Plan notes found for this encounter.   No orders of the defined types were placed in this encounter.  All questions were answered. The patient knows to call the clinic with any problems, questions or concerns. No barriers to learning was detected. I spent 15 minutes counseling the patient face  to face. The total time spent in the appointment was 20 minutes and more than 50% was on counseling and review of test results  I, Noor Dweik am acting as scribe for Dr. Truitt Merle.  I have reviewed the  above documentation for accuracy and completeness, and I agree with the above.     Truitt Merle, MD 10/17/2018

## 2018-10-17 ENCOUNTER — Encounter: Payer: Self-pay | Admitting: Hematology

## 2018-10-17 ENCOUNTER — Ambulatory Visit: Payer: Medicare HMO | Admitting: Hematology

## 2018-10-17 ENCOUNTER — Telehealth: Payer: Self-pay | Admitting: Hematology

## 2018-10-17 ENCOUNTER — Inpatient Hospital Stay: Payer: Medicare HMO | Attending: Nurse Practitioner

## 2018-10-17 ENCOUNTER — Inpatient Hospital Stay (HOSPITAL_BASED_OUTPATIENT_CLINIC_OR_DEPARTMENT_OTHER): Payer: Medicare HMO | Admitting: Hematology

## 2018-10-17 ENCOUNTER — Other Ambulatory Visit: Payer: Medicare HMO

## 2018-10-17 VITALS — BP 141/80 | HR 88 | Temp 98.0°F | Resp 18 | Ht 71.0 in | Wt 209.8 lb

## 2018-10-17 DIAGNOSIS — C182 Malignant neoplasm of ascending colon: Secondary | ICD-10-CM

## 2018-10-17 DIAGNOSIS — D509 Iron deficiency anemia, unspecified: Secondary | ICD-10-CM

## 2018-10-17 DIAGNOSIS — D500 Iron deficiency anemia secondary to blood loss (chronic): Secondary | ICD-10-CM

## 2018-10-17 DIAGNOSIS — I10 Essential (primary) hypertension: Secondary | ICD-10-CM

## 2018-10-17 LAB — CMP (CANCER CENTER ONLY)
ALT: 9 U/L (ref 0–44)
AST: 13 U/L — ABNORMAL LOW (ref 15–41)
Albumin: 3.9 g/dL (ref 3.5–5.0)
Alkaline Phosphatase: 90 U/L (ref 38–126)
Anion gap: 12 (ref 5–15)
BUN: 19 mg/dL (ref 8–23)
CHLORIDE: 105 mmol/L (ref 98–111)
CO2: 21 mmol/L — AB (ref 22–32)
Calcium: 9.6 mg/dL (ref 8.9–10.3)
Creatinine: 1.08 mg/dL (ref 0.61–1.24)
Glucose, Bld: 102 mg/dL — ABNORMAL HIGH (ref 70–99)
POTASSIUM: 4.2 mmol/L (ref 3.5–5.1)
SODIUM: 138 mmol/L (ref 135–145)
Total Bilirubin: 0.6 mg/dL (ref 0.3–1.2)
Total Protein: 7 g/dL (ref 6.5–8.1)

## 2018-10-17 LAB — CBC WITH DIFFERENTIAL (CANCER CENTER ONLY)
Abs Immature Granulocytes: 0 10*3/uL (ref 0.00–0.07)
Basophils Absolute: 0 10*3/uL (ref 0.0–0.1)
Basophils Relative: 0 %
Eosinophils Absolute: 0.1 10*3/uL (ref 0.0–0.5)
Eosinophils Relative: 2 %
HCT: 40 % (ref 39.0–52.0)
Hemoglobin: 13 g/dL (ref 13.0–17.0)
Immature Granulocytes: 0 %
Lymphocytes Relative: 37 %
Lymphs Abs: 1.7 10*3/uL (ref 0.7–4.0)
MCH: 27.8 pg (ref 26.0–34.0)
MCHC: 32.5 g/dL (ref 30.0–36.0)
MCV: 85.7 fL (ref 80.0–100.0)
Monocytes Absolute: 0.6 10*3/uL (ref 0.1–1.0)
Monocytes Relative: 12 %
Neutro Abs: 2.3 10*3/uL (ref 1.7–7.7)
Neutrophils Relative %: 49 %
Platelet Count: 182 10*3/uL (ref 150–400)
RBC: 4.67 MIL/uL (ref 4.22–5.81)
RDW: 19.9 % — AB (ref 11.5–15.5)
WBC Count: 4.6 10*3/uL (ref 4.0–10.5)
nRBC: 0 % (ref 0.0–0.2)

## 2018-10-17 LAB — CEA (IN HOUSE-CHCC): CEA (CHCC-IN HOUSE): 2.05 ng/mL (ref 0.00–5.00)

## 2018-10-17 LAB — FERRITIN: FERRITIN: 83 ng/mL (ref 24–336)

## 2018-10-17 MED ORDER — CAPECITABINE 500 MG PO TABS: ORAL_TABLET | ORAL | 2 refills | Status: DC

## 2018-10-17 NOTE — Telephone Encounter (Signed)
Printed calendar and avs. °

## 2018-10-18 ENCOUNTER — Other Ambulatory Visit: Payer: Medicare HMO

## 2018-10-18 ENCOUNTER — Ambulatory Visit: Payer: Medicare HMO | Admitting: Hematology

## 2018-10-23 MED FILL — CAPECITABINE 500 MG TABS: 500 | 28 days supply | Qty: 112 | Fill #0

## 2018-10-27 ENCOUNTER — Other Ambulatory Visit: Payer: Self-pay | Admitting: Physician Assistant

## 2018-11-02 ENCOUNTER — Telehealth: Payer: Self-pay | Admitting: Family Medicine

## 2018-11-02 NOTE — Telephone Encounter (Signed)
Patient came by office requesting states needs refills on these medications ( some she prescribed, others by other providers):  1)---aspirin EC 81 MG EC tablet [[id number]]   Order Details  Dose: 81 mg Route: Oral Frequency: Daily  Dispense Quantity: 30 tablet Refills: 1 Fills remaining: --        Sig: Take 1 tablet (81 mg total) by mouth daily.       Written Date: 02/25/17 Expiration Date: 02/25/18    Start Date: 02/16/17 1000 End Date: --         Ordering Provider: Elgie Collard, PA-C DEA #:  [id number] NPI:       2)----ferrous sulfate 325 (65 FE) MG EC tablet [[id number]]   Order Details  Dose: 325 mg Route: Oral Frequency: 3 times daily with meals  Dispense Quantity: 90 tablet Refills: 11 Fills remaining: --        Sig: Take 1 tablet (325 mg total) by mouth 3 (three) times daily with meals.  Patient taking differently: Take 325 mg by mouth daily with breakfast.        Written Date: 01/17/18 Expiration Date: 01/17/19    Start Date: 01/17/18 End Date: --         Ordering Provider: -- DEA #:  -- NPI:  --   Authorizing Provider: [name], DO DEA #:  [id number] NPI:  [id number]      0) --- furosemide (LASIX) 40 MG tablet [[id number]]   Order Details  Dose, Route, Frequency: As Directed   Dispense Quantity: 90 tablet Refills: 3 Fills remaining: --        Sig: Take 40 mg (1 tab) twice daily for 2 days, then take 40 mg (1 tab) daily       Written Date: 07/06/18 Expiration Date: 07/06/19    Start Date: 07/06/18 End Date: --         Ordering Provider: Edwin Dada, MD DEA #:  [id number] NPI:  [id number]      4) --- gabapentin (NEURONTIN) 100 MG capsule [[id number]]   Order Details  Dose: 100 mg Route: Oral Frequency: 2 times daily  Dispense Quantity: 60 capsule Refills: 2 Fills remaining: --        Sig: Take 1 capsule (100 mg total) by mouth 2 (two) times daily.       Written Date: 09/20/18 Expiration Date: 09/20/19    Start Date: 09/20/18 End Date: --          Ordering Provider: Truitt Merle, MD DEA #:  [id number] NPI:  [id number]      5) --- metoprolol succinate (TOPROL-XL) 50 MG 24 hr tablet [[id number]]   Order Details  Dose: 50 mg Route: Oral Frequency: Daily  Note to Pharmacy:  Please call us to make overdue appt for future refills. [phone]. 2nd attempt.      Dispense Quantity: 15 tablet Refills: 0 Fills remaining: --        Sig: Take 1 tablet (50 mg total) by mouth daily. Please make overdue appt for future refills. [phone]. 2nd attempt.       Written Date: 10/27/18 Expiration Date: 10/27/19    Start Date: 10/27/18 End Date: --         Ordering Provider: Imogene Burn, PA-C DEA #:  [id number] NPI:  [id number]      6) -- pravastatin (PRAVACHOL) 40 MG tablet [[id number]]   Order Details  Dose: 40 mg Route: Oral Frequency: Daily at bedtime  Dispense Quantity: 90 tablet Refills: 1 Fills  remaining: --        Sig: Take 1 tablet (40 mg total) by mouth at bedtime.       Written Date: 09/05/18 Expiration Date: 09/05/19    Start Date: 09/05/18 End Date: --         Ordering Provider: [name], DO DEA #:  [id number]        9) --- traMADol (ULTRAM) 50 MG tablet [[id number]]   Order Details  Dose: 50 mg Route: Oral Frequency: Every 12 hours PRN for moderate pain  Dispense Quantity: 30 tablet Refills: 0 Fills remaining: --        Sig: Take 1 tablet (50 mg total) by mouth every 12 (twelve) hours as needed for moderate pain.       Written Date: 06/22/18 Expiration Date: 12/19/18    Start Date: 06/14/18 2254 End Date: --         Ordering Provider: Shelly Coss, MD DEA #:  [id number] NPI:  [id number]      --Forwarding request to medical assistant if approved to send refill orders to :   [facility] ([address]), [place] - [address] [phone] (Phone) [phone] (Fax)   --glh

## 2018-11-03 NOTE — Telephone Encounter (Signed)
Called patient and he states that he would rather have Dr. Raliegh Scarlet follow all of his medications instead of going to cardiology.  I advised the patient that it is best for specialist to manage the care for his needs due to them being able to handle that area of his care in more detail.  Please review and advise if you can mange all medications or if patient needs to continue with cardiology care. Please ask me about this one.  MPulliam, CMA/RT(R)

## 2018-11-05 NOTE — Telephone Encounter (Signed)
Pt with CABG 02/2017 among many other issues.  No, he needs to be seen, followed and managed by Cards

## 2018-11-07 NOTE — Telephone Encounter (Signed)
Patient notified. MPulliam, CMA/RT(R)  

## 2018-11-13 NOTE — Progress Notes (Signed)
[facility]   [phone] Fax:([phone]) [phone]   Clinic Follow up Note   Patient Care Team: [name], DO as PCP - General (Family Medicine) Pyrtle, Lajuan Lines, MD as Consulting Physician (Gastroenterology) Allyn Kenner, MD as Consulting Physician (Dermatology) Dorothy Spark, MD as Consulting Physician (Cardiology) Levin Erp, [place] as Physician Assistant (Gastroenterology) Murrell Converse as Physician Assistant (Cardiology) 11/14/2018  CHIEF COMPLAINT: F/u on stage III colon cancer    SUMMARY OF ONCOLOGIC HISTORY: Oncology History   Cancer Staging Cancer of ascending colon pT4apN1 s/p right colectomy 05/19/2018 Staging form: Colon and Rectum, AJCC 8th Edition - Pathologic stage from 05/19/2018: Stage IIIB (pT4a, pN1, cM0) - Signed by Truitt Merle, MD on 05/24/2018       Cancer of ascending colon pT4apN1 s/p right colectomy 05/19/2018   05/18/2018 Imaging    05/18/2018 CT AP IMPRESSION: 1. There appears to be a nearly obstructing lesion in the region of the hepatic flexure of the colon which is highly concerning for primary colonic neoplasm. Slight haziness of the surrounding soft tissues may be indicative of early local invasion. This appears partially obstructive as evidenced by dilatation of more proximal aspects of the small bowel and colon. 2. Left adrenal lesion is stable compared to prior studies, previously characterized as an adenoma. 3. Aortic atherosclerosis, in addition to left main and 3 vessel coronary artery disease. Status post median sternotomy for CABG. 4. Additional incidental findings, as above.  Aortic Atherosclerosis (ICD10-I70.0).    05/18/2018 - 05/30/2018 Hospital Admission    Admit date: 05/18/2018 Admission diagnosis: Colon Cancer Discharged on: 05/30/2018    05/19/2018 Cancer Staging    Staging form: Colon and Rectum, AJCC 8th Edition - Pathologic stage from 05/19/2018: Stage IIIB (pT4a, pN1, cM0) - Signed by Truitt Merle, MD on 05/24/2018     05/19/2018 Pathology Results    05/19/2018 Surgical Pathology Diagnosis Colon, segmental resection for tumor, right - INVASIVE COLORECTAL ADENOCARCINOMA, 5 CM. - CARCINOMA FOCALLY LESS THAN 0.1 CM FROM SEROSAL SURFACE. - MARGINS NOT INVOLVED. - METASTATIC CARCINOMA IN ONE OF EIGHT LYMPH NODES (1/9). - BENIGN APPENDIX.    05/19/2018 Surgery    EXPLORATORY LAPAROTOMY with right colectomy with Fanny Skates, MD    05/24/2018 Initial Diagnosis    Cancer of right colon ([place])    06/12/2018 Imaging     06/12/2018 CT CAP IMPRESSION: 1. Evidence all bowel perforation/anastomosed breakdown with large volume intraperitoneal free air fluid in the RIGHT upper quadrant adjacent and above the liver. 2. Large loculated fluid collection in the RIGHT anterior pelvis extending along the LEFT pericolic gutter consists with PERITONEAL ABSCESS. 3. Of note, no large volume intraperitoneal free fluid. Oral contrast passed through the RIGHT colon anastomosis without evidence of leak. Findings could indicate a prior anastomotic breakdown which has healed in the interval with subsequent abscess formation from the prior leak / breakdown.    06/17/2018 Procedure    06/17/2018 US Thoracentesis IMPRESSION: Successful ultrasound guided right thoracentesis yielding 1 L of pleural fluid. No pneumothorax on post-procedure chest x-ray.  No malignancy on pathology    06/20/2018 Imaging    06/20/2018 CT AP IMPRESSION: 1. Decompression of right upper quadrant air and fluid collection after percutaneous catheter drainage. There remains a tiny rim of fluid as well as small extraluminal gas bubbles in this region suspicious for continued leak from bowel. 2. Further decrease in size of residual abscess fluid collection in the pelvis after percutaneous catheter drainage. There remains  some fluid around the drainage catheter in the right pelvis.    07/04/2018 Imaging    07/04/2018 CT  AP IMPRESSION: 1. Further decompression of right perihepatic abscess with resolution of extraluminal air. 2. Relatively stable volume of fluid surrounding the right pelvic drainage catheter.    07/05/2018 Imaging    07/05/2018 CT AP IMPRESSION: 1. No change compared to the CT scan performed 9 hours earlier. 2. Anterior right perihepatic space collection is decompressed by indwelling percutaneous drain, with no residual measurable collection in this location. 3. Thick walled right lower quadrant collection with indwelling percutaneous drain is stable. No new focal fluid collections. 4. Stable postsurgical changes from right hemicolectomy with no evidence of bowel obstruction. Stable reactive wall thickening in the sigmoid colon adjacent to the right lower quadrant collection. 5. Stable small to moderate dependent right pleural effusion. 6. Stable left adrenal adenoma. 7.  Aortic Atherosclerosis (ICD10-I70.0).    07/31/2018 -  Chemotherapy    Adjuvant Xeloda 2000mg  bid, 7 days on, 7 days of      CURRENT THERAPY   Xeloda2000mg  BIDone week on,one week offstarted 10/21//2019  INTERVAL HISTORY: Vincent Mcclure is a 85 y.o. male who is here for follow-up. Today, he is here alone. He is doing well. He c/o diarrhea twice a day and has had accident at night. Imodiom did not help with diarrhea but the last couple of days the diarrhea has improved. Denies stomach pain. He does not use a cane often.     Pertinent positives and negatives of review of systems are listed and detailed within the above HPI.  REVIEW OF SYSTEMS:   Constitutional: Denies fevers, chills or abnormal weight loss Eyes: Denies blurriness of vision Ears, nose, mouth, throat, and face: Denies mucositis or sore throat Respiratory: Denies cough, dyspnea or wheezes Cardiovascular: Denies palpitation, chest discomfort or lower extremity swelling Gastrointestinal:  Denies nausea, heartburn, (+) diarrhea  Skin: Denies  abnormal skin rashes Lymphatics: Denies new lymphadenopathy or easy bruising Neurological:Denies numbness, tingling or new weaknesses Behavioral/Psych: Mood is stable, no new changes  All other systems were reviewed with the patient and are negative.  MEDICAL HISTORY:  Past Medical History:  Diagnosis Date  . Alcohol abuse   . Anemia   . Chronic low back pain 10/14/2011  . Colon polyps    hyperplastic (2004, 2010) and adenomatous (1990).    . COLONIC POLYPS, HX OF 03/05/2008  . Esophageal stricture    hx of  . Gastric ulcer   . GERD 03/05/2008  . GERD (gastroesophageal reflux disease) 1994   associated peptic strictures  . HYPERLIPIDEMIA 03/05/2008  . HYPERTENSION 03/05/2008  . Hypertension   . IBS 03/05/2008  . Impaired glucose tolerance 10/12/2011  . Iron deficiency anemia   . LEG CRAMPS 09/02/2010  . PAD (peripheral artery disease) ([place]) 10/14/2011  . PAD (peripheral artery disease) ([place]) 2013  . PERIPHERAL EDEMA 09/02/2010  . PERIPHERAL NEUROPATHY 03/05/2008  . Prostatitis    hx of  . VARICOSE VEINS, LOWER EXTREMITIES 06/09/2009    SURGICAL HISTORY: Past Surgical History:  Procedure Laterality Date  . CATARACT EXTRACTION  bilat  . COLECTOMY    . CORONARY ARTERY BYPASS GRAFT N/A 02/15/2017   Procedure: CORONARY ARTERY BYPASS GRAFTING times three  with left internal mammary harvest and endoscopic harvest of Right SVG. Grafts of LIMA to  LAD, SVG to Distal Circ, and to First Diag.;  Surgeon: Grace Isaac, MD;  Location: [facility];  Service: Open Heart Surgery;  Laterality: N/A;  . ESOPHAGOGASTRODUODENOSCOPY N/A 11/14/2014   Procedure: ESOPHAGOGASTRODUODENOSCOPY (EGD);  Surgeon: Jerene Bears, MD;  Location[facility] ENDOSCOPY;  Service: Endoscopy;  Laterality: N/A;  . IR RADIOLOGIST EVAL & MGMT  07/04/2018  . IR RADIOLOGIST EVAL & MGMT  07/18/2018  . LAPAROTOMY N/A 05/19/2018   Procedure: EXPLORATORY LAPAROTOMY RIGHT COLECTOMY;  Surgeon: Fanny Skates, MD;  Location: [facility];  Service:  General;  Laterality: N/A;  . LEFT HEART CATH AND CORONARY ANGIOGRAPHY N/A 02/14/2017   Procedure: Left Heart Cath and Coronary Angiography;  Surgeon: Belva Crome, MD;  Location: [facility];  Service: Cardiovascular;  Laterality: N/A;  . LUMBAR SPINE SURGERY  11/2008   Dr Joya Salm  . TEE WITHOUT CARDIOVERSION N/A 02/15/2017   Procedure: TRANSESOPHAGEAL ECHOCARDIOGRAM (TEE);  Surgeon: Grace Isaac, MD;  Location: [facility];  Service: Open Heart Surgery;  Laterality: N/A;    I have reviewed the social history and family history with the patient and they are unchanged from previous note.  ALLERGIES:  is allergic to no known allergies.  MEDICATIONS:  Current Outpatient Medications  Medication Sig Dispense Refill  . acetaminophen (TYLENOL) 500 MG tablet Take 1 tablet (500 mg total) by mouth every 6 (six) hours as needed for mild pain.  0  . aspirin EC 81 MG EC tablet Take 1 tablet (81 mg total) by mouth daily. 30 tablet 1  . capecitabine (XELODA) 500 MG tablet take 4 tab (2000mg ) twice daily after meals, 7 days on and 7 days off 112 tablet 2  . ferrous sulfate 325 (65 FE) MG EC tablet Take 1 tablet (325 mg total) by mouth 3 (three) times daily with meals. (Patient taking differently: Take 325 mg by mouth daily with breakfast. ) 90 tablet 11  . furosemide (LASIX) 40 MG tablet Take 40 mg (1 tab) twice daily for 2 days, then take 40 mg (1 tab) daily 90 tablet 3  . gabapentin (NEURONTIN) 100 MG capsule Take 1 capsule (100 mg total) by mouth 2 (two) times daily. 60 capsule 2  . guaiFENesin (MUCINEX) 600 MG 12 hr tablet Take 600 mg by mouth 2 (two) times daily as needed.     . metoprolol succinate (TOPROL-XL) 50 MG 24 hr tablet Take 1 tablet (50 mg total) by mouth daily. Please make overdue appt for future refills. [phone]. 2nd attempt. 15 tablet 0  . Multiple Vitamin (MULTIVITAMIN WITH MINERALS) TABS tablet Take 1 tablet by mouth daily.    . pravastatin (PRAVACHOL) 40 MG tablet Take 1  tablet (40 mg total) by mouth at bedtime. 90 tablet 1  . Probiotic Product (ALIGN PO) Take by mouth.    . traMADol (ULTRAM) 50 MG tablet Take 1 tablet (50 mg total) by mouth every 12 (twelve) hours as needed for moderate pain. 30 tablet 0  . vitamin C (ASCORBIC ACID) 500 MG tablet Take 500 mg by mouth 2 (two) times daily.     No current facility-administered medications for this visit.     PHYSICAL EXAMINATION: ECOG PERFORMANCE STATUS: 1 - Symptomatic but completely ambulatory  Vitals:   11/14/18 1220  BP: 120/71  Pulse: 80  Resp: 18  Temp: 97.7 F (36.5 C)  SpO2: 98%   Filed Weights   11/14/18 1220  Weight: 213 lb 3.2 oz (96.7 kg)    GENERAL:alert, no distress and comfortable SKIN: skin color, texture, turgor are normal, no rashes or significant lesions EYES: normal, Conjunctiva are pink and non-injected, sclera clear OROPHARYNX:no exudate,  no erythema and lips, buccal mucosa, and tongue normal  NECK: supple, thyroid normal size, non-tender, without nodularity LYMPH:  no palpable lymphadenopathy in the cervical, axillary or inguinal LUNGS: clear to auscultation and percussion with normal breathing effort HEART: regular rate & rhythm and no murmurs and no lower extremity edema ABDOMEN:abdomen soft, non-tender and normal bowel sounds Musculoskeletal:no cyanosis of digits and no clubbing  NEURO: alert & oriented x 3 with fluent speech, no focal motor/sensory deficits  LABORATORY DATA:  I have reviewed the data as listed CBC Latest Ref Rng & Units 11/14/2018 10/17/2018 09/20/2018  WBC 4.0 - 10.5 K/uL 5.2 4.6 4.7  Hemoglobin 13.0 - 17.0 g/dL 12.5(L) 13.0 12.6(L)  Hematocrit 39.0 - 52.0 % 38.3(L) 40.0 38.4(L)  Platelets 150 - 400 K/uL 190 182 199     CMP Latest Ref Rng & Units 11/14/2018 10/17/2018 09/20/2018  Glucose 70 - 99 mg/dL 112(H) 102(H) 135(H)  BUN 8 - 23 mg/dL 23 19 15   Creatinine 0.61 - 1.24 mg/dL 1.23 1.08 1.07  Sodium 135 - 145 mmol/L 142 138 139  Potassium 3.5 -  5.1 mmol/L 3.6 4.2 3.9  Chloride 98 - 111 mmol/L 105 105 107  CO2 22 - 32 mmol/L 29 21(L) 23  Calcium 8.9 - 10.3 mg/dL 9.4 9.6 9.3  Total Protein 6.5 - 8.1 g/dL 6.9 7.0 6.6  Total Bilirubin 0.3 - 1.2 mg/dL 0.8 0.6 0.3  Alkaline Phos 38 - 126 U/L 89 90 92  AST 15 - 41 U/L 12(L) 13(L) 11(L)  ALT 0 - 44 U/L 7 9 <6      RADIOGRAPHIC STUDIES: I have personally reviewed the radiological images as listed and agreed with the findings in the report. No results found.   ASSESSMENT & PLAN:  Vincent Mcclure is a 85 y.o. male with history of  1. Adenocarcinoma of right colon, invasive adenocarcinoma, stage IIIB (pT4a, pN1, cM0), MSS -Diagnosed in 05/2018. Treated with hemicolectomy. He is currently on adjuvant Xeloda 2000 mg BID one week on, one week off since 07/31/2018. Plan for 6 months. -He is tolerating moderately well, he has developed moderate diarrhea last week, improved this week.  Due to his advanced age, and a diarrhea, I will decrease Xarelto to 200mg  am and 1500mg  pm, continue one week on and one week off  -Labs reviewed, mild anemia, otherwise unremarkable, adequate for treatment. -will return in 3 weeks    2. Anemia, due to chronic blood loss and iron deficiency  -He is currently on oral iron. Improved after surgery  -Labs reviewed, Hg 12.5, overall stable   3. CAD S/P CABG -f/u with PCP   4. Neuropathy in feet  -Currently on Gabapentin.     Plan  -Due to his moderate diarrhea, will decrease Xarelto to 2000mg  am and 1500mg  pm, continue one week on and one week off, he will start again next Monday 2/10 -lab and f/u in 3 weeks    No problem-specific Assessment & Plan notes found for this encounter.   No orders of the defined types were placed in this encounter.  All questions were answered. The patient knows to call the clinic with any problems, questions or concerns. No barriers to learning was detected. I spent 15 minutes counseling the patient face to face. The  total time spent in the appointment was 20 minutes and more than 50% was on counseling and review of test results  I, Manson Allan am acting as scribe for Dr. Truitt Merle.  I have  reviewed the above documentation for accuracy and completeness, and I agree with the above.     Truitt Merle, MD 11/14/2018

## 2018-11-14 ENCOUNTER — Encounter: Payer: Self-pay | Admitting: Hematology

## 2018-11-14 ENCOUNTER — Inpatient Hospital Stay (HOSPITAL_BASED_OUTPATIENT_CLINIC_OR_DEPARTMENT_OTHER): Payer: Medicare HMO | Admitting: Hematology

## 2018-11-14 ENCOUNTER — Inpatient Hospital Stay: Payer: Medicare HMO | Attending: Nurse Practitioner

## 2018-11-14 ENCOUNTER — Telehealth: Payer: Self-pay | Admitting: Hematology

## 2018-11-14 VITALS — BP 120/71 | HR 80 | Temp 97.7°F | Resp 18 | Ht 71.0 in | Wt 213.2 lb

## 2018-11-14 DIAGNOSIS — D500 Iron deficiency anemia secondary to blood loss (chronic): Secondary | ICD-10-CM | POA: Insufficient documentation

## 2018-11-14 DIAGNOSIS — C182 Malignant neoplasm of ascending colon: Secondary | ICD-10-CM | POA: Diagnosis not present

## 2018-11-14 DIAGNOSIS — G629 Polyneuropathy, unspecified: Secondary | ICD-10-CM | POA: Diagnosis not present

## 2018-11-14 DIAGNOSIS — D509 Iron deficiency anemia, unspecified: Secondary | ICD-10-CM

## 2018-11-14 LAB — CMP (CANCER CENTER ONLY)
ALT: 7 U/L (ref 0–44)
AST: 12 U/L — ABNORMAL LOW (ref 15–41)
Albumin: 3.9 g/dL (ref 3.5–5.0)
Alkaline Phosphatase: 89 U/L (ref 38–126)
Anion gap: 8 (ref 5–15)
BILIRUBIN TOTAL: 0.8 mg/dL (ref 0.3–1.2)
BUN: 23 mg/dL (ref 8–23)
CO2: 29 mmol/L (ref 22–32)
Calcium: 9.4 mg/dL (ref 8.9–10.3)
Chloride: 105 mmol/L (ref 98–111)
Creatinine: 1.23 mg/dL (ref 0.61–1.24)
GFR, EST NON AFRICAN AMERICAN: 54 mL/min — AB (ref >60)
GFR, Est AFR Am: >60 mL/min (ref >60)
Glucose, Bld: 112 mg/dL — ABNORMAL HIGH (ref 70–99)
Potassium: 3.6 mmol/L (ref 3.5–5.1)
Sodium: 142 mmol/L (ref 135–145)
TOTAL PROTEIN: 6.9 g/dL (ref 6.5–8.1)

## 2018-11-14 LAB — CBC WITH DIFFERENTIAL (CANCER CENTER ONLY)
Abs Immature Granulocytes: 0.01 10*3/uL (ref 0.00–0.07)
Basophils Absolute: 0 10*3/uL (ref 0.0–0.1)
Basophils Relative: 0 %
Eosinophils Absolute: 0.1 10*3/uL (ref 0.0–0.5)
Eosinophils Relative: 1 %
HCT: 38.3 % — ABNORMAL LOW (ref 39.0–52.0)
Hemoglobin: 12.5 g/dL — ABNORMAL LOW (ref 13.0–17.0)
Immature Granulocytes: 0 %
Lymphocytes Relative: 31 %
Lymphs Abs: 1.6 10*3/uL (ref 0.7–4.0)
MCH: 29.3 pg (ref 26.0–34.0)
MCHC: 32.6 g/dL (ref 30.0–36.0)
MCV: 89.7 fL (ref 80.0–100.0)
Monocytes Absolute: 0.6 10*3/uL (ref 0.1–1.0)
Monocytes Relative: 12 %
NRBC: 0 % (ref 0.0–0.2)
Neutro Abs: 2.9 10*3/uL (ref 1.7–7.7)
Neutrophils Relative %: 56 %
Platelet Count: 190 10*3/uL (ref 150–400)
RBC: 4.27 MIL/uL (ref 4.22–5.81)
RDW: 19.6 % — AB (ref 11.5–15.5)
WBC Count: 5.2 10*3/uL (ref 4.0–10.5)

## 2018-11-14 LAB — FERRITIN: Ferritin: 119 ng/mL (ref 24–336)

## 2018-11-14 NOTE — Telephone Encounter (Signed)
Scheduled appt per 02/04 los.  Printed calendar and avs.

## 2018-11-17 ENCOUNTER — Other Ambulatory Visit: Payer: Self-pay | Admitting: Hematology

## 2018-11-17 ENCOUNTER — Other Ambulatory Visit: Payer: Self-pay

## 2018-11-17 DIAGNOSIS — C182 Malignant neoplasm of ascending colon: Secondary | ICD-10-CM

## 2018-11-17 MED ORDER — CAPECITABINE 500 MG PO TABS: ORAL_TABLET | ORAL | 2 refills | Status: DC

## 2018-12-06 NOTE — Progress Notes (Signed)
[place]   [phone]) [phone] [phone]) [phone]   Clinic Follow up Note   Patient Care Team: [name], DO as PCP - General (Family Medicine) Pyrtle, Lajuan Lines, MD as Consulting Physician (Gastroenterology) Allyn Kenner, MD as Consulting Physician (Dermatology) Dorothy Spark, MD as Consulting Physician (Cardiology) Levin Erp, [place] as Physician Assistant (Gastroenterology) Murrell Converse as Physician Assistant (Cardiology)  Date of Service:  12/08/2018  CHIEF COMPLAINT: F/u on stage III colon cancer    SUMMARY OF ONCOLOGIC HISTORY: Oncology History   Cancer Staging Cancer of ascending colon pT4apN1 s/p right colectomy 05/19/2018 Staging form: Colon and Rectum, AJCC 8th Edition - Pathologic stage from 05/19/2018: Stage IIIB (pT4a, pN1, cM0) - Signed by Truitt Merle, MD on 05/24/2018       Cancer of ascending colon pT4apN1 s/p right colectomy 05/19/2018   05/18/2018 Imaging    05/18/2018 CT AP IMPRESSION: 1. There appears to be a nearly obstructing lesion in the region of the hepatic flexure of the colon which is highly concerning for primary colonic neoplasm. Slight haziness of the surrounding soft tissues may be indicative of early local invasion. This appears partially obstructive as evidenced by dilatation of more proximal aspects of the small bowel and colon. 2. Left adrenal lesion is stable compared to prior studies, previously characterized as an adenoma. 3. Aortic atherosclerosis, in addition to left main and 3 vessel coronary artery disease. Status post median sternotomy for CABG. 4. Additional incidental findings, as above.  Aortic Atherosclerosis (ICD10-I70.0).    05/18/2018 - 05/30/2018 Hospital Admission    Admit date: 05/18/2018 Admission diagnosis: Colon Cancer Discharged on: 05/30/2018    05/19/2018 Cancer Staging    Staging form: Colon and Rectum, AJCC 8th Edition - Pathologic stage from 05/19/2018: Stage IIIB (pT4a, pN1,  cM0) - Signed by Truitt Merle, MD on 05/24/2018     05/19/2018 Pathology Results    05/19/2018 Surgical Pathology Diagnosis Colon, segmental resection for tumor, right - INVASIVE COLORECTAL ADENOCARCINOMA, 5 CM. - CARCINOMA FOCALLY LESS THAN 0.1 CM FROM SEROSAL SURFACE. - MARGINS NOT INVOLVED. - METASTATIC CARCINOMA IN ONE OF EIGHT LYMPH NODES (1/9). - BENIGN APPENDIX.    05/19/2018 Surgery    EXPLORATORY LAPAROTOMY with right colectomy with Fanny Skates, MD    05/24/2018 Initial Diagnosis    Cancer of right colon ([place])    06/12/2018 Imaging     06/12/2018 CT CAP IMPRESSION: 1. Evidence all bowel perforation/anastomosed breakdown with large volume intraperitoneal free air fluid in the RIGHT upper quadrant adjacent and above the liver. 2. Large loculated fluid collection in the RIGHT anterior pelvis extending along the LEFT pericolic gutter consists with PERITONEAL ABSCESS. 3. Of note, no large volume intraperitoneal free fluid. Oral contrast passed through the RIGHT colon anastomosis without evidence of leak. Findings could indicate a prior anastomotic breakdown which has healed in the interval with subsequent abscess formation from the prior leak / breakdown.    06/17/2018 Procedure    06/17/2018 US Thoracentesis IMPRESSION: Successful ultrasound guided right thoracentesis yielding 1 L of pleural fluid. No pneumothorax on post-procedure chest x-ray.  No malignancy on pathology    06/20/2018 Imaging    06/20/2018 CT AP IMPRESSION: 1. Decompression of right upper quadrant air and fluid collection after percutaneous catheter drainage. There remains a tiny rim of fluid as well as small extraluminal gas bubbles in this region suspicious for continued leak from bowel. 2. Further decrease in size of residual abscess fluid collection in the pelvis after  percutaneous catheter drainage. There remains some fluid around the drainage catheter in the right pelvis.    07/04/2018 Imaging     07/04/2018 CT AP IMPRESSION: 1. Further decompression of right perihepatic abscess with resolution of extraluminal air. 2. Relatively stable volume of fluid surrounding the right pelvic drainage catheter.    07/05/2018 Imaging    07/05/2018 CT AP IMPRESSION: 1. No change compared to the CT scan performed 9 hours earlier. 2. Anterior right perihepatic space collection is decompressed by indwelling percutaneous drain, with no residual measurable collection in this location. 3. Thick walled right lower quadrant collection with indwelling percutaneous drain is stable. No new focal fluid collections. 4. Stable postsurgical changes from right hemicolectomy with no evidence of bowel obstruction. Stable reactive wall thickening in the sigmoid colon adjacent to the right lower quadrant collection. 5. Stable small to moderate dependent right pleural effusion. 6. Stable left adrenal adenoma. 7.  Aortic Atherosclerosis (ICD10-I70.0).    07/31/2018 -  Chemotherapy    Adjuvant Xeloda 2000mg  bid, 7 days on, 7 days off.  Reduced to 2000mg  in the AM and 1500mg  in the PM due to diarrhea.        CURRENT THERAPY:  Xeloda2000mg  BIDone week on,one week offstarted 07/31/2018. Reduced to 2000mg  in the AM and 1500mg  in the PM due to diarrhea.   INTERVAL HISTORY:  DAKHARI ZUVER is here for a follow up of treatment. He presents to the clinic today with his wife. He notes discoloration of finger nails and sensitivity of his fingertips from neuropathy. He remains to have neuropathy in feet which was present before chemo.  He notes significant diarrhea 1 day where he had about 8 bowel movements last week. He was mild constipated to begin with. He did try Imodium over 4 times and did not get much results.  He notes he is takes Xeloda one week on, one week off. He does feel weaker and has not done as much as he use to. He notes his appetite is doing well and he has been gaining weight.     REVIEW OF  SYSTEMS:   Constitutional: Denies fevers, chills (+) weight gain (+) Fatigue  Eyes: Denies blurriness of vision Ears, nose, mouth, throat, and face: Denies mucositis or sore throat Respiratory: Denies cough, dyspnea or wheezes Cardiovascular: Denies palpitation, chest discomfort or lower extremity swelling Gastrointestinal:  Denies nausea, heartburn (+) Occasional diarrhea/constipation  Skin: Denies abnormal skin rashes (+) Nail loss and discoloration  Lymphatics: Denies new lymphadenopathy or easy bruising Neurological:Denies numbness, tingling or new weaknesses Behavioral/Psych: Mood is stable, no new changes (+) Mild Neuropathy at fingertips and feet All other systems were reviewed with the patient and are negative.  MEDICAL HISTORY:  Past Medical History:  Diagnosis Date  . Alcohol abuse   . Anemia   . Chronic low back pain 10/14/2011  . Colon polyps    hyperplastic (2004, 2010) and adenomatous (1990).    . COLONIC POLYPS, HX OF 03/05/2008  . Esophageal stricture    hx of  . Gastric ulcer   . GERD 03/05/2008  . GERD (gastroesophageal reflux disease) 1994   associated peptic strictures  . HYPERLIPIDEMIA 03/05/2008  . HYPERTENSION 03/05/2008  . Hypertension   . IBS 03/05/2008  . Impaired glucose tolerance 10/12/2011  . Iron deficiency anemia   . LEG CRAMPS 09/02/2010  . PAD (peripheral artery disease) ([place]) 10/14/2011  . PAD (peripheral artery disease) ([place]) 2013  . PERIPHERAL EDEMA 09/02/2010  . PERIPHERAL NEUROPATHY 03/05/2008  .  Prostatitis    hx of  . VARICOSE VEINS, LOWER EXTREMITIES 06/09/2009    SURGICAL HISTORY: Past Surgical History:  Procedure Laterality Date  . CATARACT EXTRACTION  bilat  . COLECTOMY    . CORONARY ARTERY BYPASS GRAFT N/A 02/15/2017   Procedure: CORONARY ARTERY BYPASS GRAFTING times three  with left internal mammary harvest and endoscopic harvest of Right SVG. Grafts of LIMA to  LAD, SVG to Distal Circ, and to First Diag.;  Surgeon: Grace Isaac,  MD;  Location: [place];  Service: Open Heart Surgery;  Laterality: N/A;  . ESOPHAGOGASTRODUODENOSCOPY N/A 11/14/2014   Procedure: ESOPHAGOGASTRODUODENOSCOPY (EGD);  Surgeon: Jerene Bears, MD;  [facility] END[facility]COPY;  Service: Endoscopy;  Laterality: N/A;  . IR RADIOLOGIST EVAL & MGMT  07/04/2018  . IR RADIOLOGIST EVAL & MGMT  07/18/2018  . LAPAROTOMY N/A 05/19/2018   Procedure: EXPLORATORY LAPAROTOMY RIGHT COLECTOMY;  Surgeon: Fanny Skates, MD;  Location: [facility];  Service: General;  Laterality: N/A;  . LEFT HEART CATH AND CORONARY ANGIOGRAPHY N/A 02/14/2017   Procedure: Left Heart Cath and Coronary Angiography;  Surgeon: Belva Crome, MD;  Location: [place] [facility];  Service: Cardiovascular;  Laterality: N/A;  . LUMBAR SPINE SURGERY  11/2008   Dr Joya Salm  . TEE WITHOUT CARDIOVERSION N/A 02/15/2017   Procedure: TRANSESOPHAGEAL ECHOCARDIOGRAM (TEE);  Surgeon: Grace Isaac, MD;  Location: [place];  Service: Open Heart Surgery;  Laterality: N/A;    I have reviewed the social history and family history with the patient and they are unchanged from previous note.  ALLERGIES:  is allergic to no known allergies.  MEDICATIONS:  Current Outpatient Medications  Medication Sig Dispense Refill  . acetaminophen (TYLENOL) 500 MG tablet Take 1 tablet (500 mg total) by mouth every 6 (six) hours as needed for mild pain.  0  . aspirin EC 81 MG EC tablet Take 1 tablet (81 mg total) by mouth daily. 30 tablet 1  . capecitabine (XELODA) 500 MG tablet Take 2000 mg in am and 1500 mg in pm, one week on and one week off 112 tablet 2  . ferrous sulfate 325 (65 FE) MG EC tablet Take 1 tablet (325 mg total) by mouth 3 (three) times daily with meals. (Patient taking differently: Take 325 mg by mouth daily with breakfast. ) 90 tablet 11  . furosemide (LASIX) 40 MG tablet Take 40 mg (1 tab) twice daily for 2 days, then take 40 mg (1 tab) daily 90 tablet 3  . gabapentin (NEURONTIN) 100 MG capsule Take 1 capsule (100 mg  total) by mouth 2 (two) times daily. 60 capsule 2  . guaiFENesin (MUCINEX) 600 MG 12 hr tablet Take 600 mg by mouth 2 (two) times daily as needed.     . Multiple Vitamin (MULTIVITAMIN WITH MINERALS) TABS tablet Take 1 tablet by mouth daily.    . pravastatin (PRAVACHOL) 40 MG tablet Take 1 tablet (40 mg total) by mouth at bedtime. 90 tablet 1  . Probiotic Product (ALIGN PO) Take by mouth.    . vitamin C (ASCORBIC ACID) 500 MG tablet Take 500 mg by mouth 2 (two) times daily.    . metoprolol succinate (TOPROL-XL) 50 MG 24 hr tablet Take 1 tablet (50 mg total) by mouth daily. Please make overdue appt for future refills. [phone]. 2nd attempt. (Patient not taking: Reported on 12/08/2018) 15 tablet 0   No current facility-administered medications for this visit.     PHYSICAL EXAMINATION: ECOG PERFORMANCE STATUS: 1 -  Symptomatic but completely ambulatory  Vitals:   12/08/18 0917  BP: 137/72  Pulse: 92  Resp: 18  Temp: (!) 97.5 F (36.4 C)  SpO2: 99%   Filed Weights   12/08/18 0917  Weight: 215 lb 11.2 oz (97.8 kg)    GENERAL:alert, no distress and comfortable SKIN: skin color, texture, turgor are normal, no rashes or significant lesions EYES: normal, Conjunctiva are pink and non-injected, sclera clear OROPHARYNX:no exudate, no erythema and lips, buccal mucosa, and tongue normal  NECK: supple, thyroid normal size, non-tender, without nodularity LYMPH:  no palpable lymphadenopathy in the cervical, axillary or inguinal LUNGS: clear to auscultation and percussion with normal breathing effort HEART: regular rate & rhythm and no murmurs and no lower extremity edema ABDOMEN:abdomen soft, non-tender and normal bowel sounds (+) Midline Surgical incision healed well  Musculoskeletal:no cyanosis of digits and no clubbing  NEURO: alert & oriented x 3 with fluent speech, no focal motor/sensory deficits  LABORATORY DATA:  I have reviewed the data as listed CBC Latest Ref Rng & Units 12/08/2018  11/14/2018 10/17/2018  WBC 4.0 - 10.5 K/uL 4.5 5.2 4.6  Hemoglobin 13.0 - 17.0 g/dL 12.9(L) 12.5(L) 13.0  Hematocrit 39.0 - 52.0 % 40.0 38.3(L) 40.0  Platelets 150 - 400 K/uL 161 190 182     CMP Latest Ref Rng & Units 12/08/2018 11/14/2018 10/17/2018  Glucose 70 - 99 mg/dL 141(H) 112(H) 102(H)  BUN 8 - 23 mg/dL 15 23 19   Creatinine 0.61 - 1.24 mg/dL 1.10 1.23 1.08  Sodium 135 - 145 mmol/L 140 142 138  Potassium 3.5 - 5.1 mmol/L 4.3 3.6 4.2  Chloride 98 - 111 mmol/L 105 105 105  CO2 22 - 32 mmol/L 26 29 21(L)  Calcium 8.9 - 10.3 mg/dL 9.1 9.4 9.6  Total Protein 6.5 - 8.1 g/dL 6.8 6.9 7.0  Total Bilirubin 0.3 - 1.2 mg/dL 0.9 0.8 0.6  Alkaline Phos 38 - 126 U/L 98 89 90  AST 15 - 41 U/L 14(L) 12(L) 13(L)  ALT 0 - 44 U/L 6 7 9       RADIOGRAPHIC STUDIES: I have personally reviewed the radiological images as listed and agreed with the findings in the report. No results found.   ASSESSMENT & PLAN:  Vincent Mcclure is a 85 y.o. male with   1. Adenocarcinoma of right colon, invasive adenocarcinoma, stage IIIB (pT4a, pN1, cM0), MSS -Diagnosed in 05/2018. Treated with hemicolectomy. He is currently onadjuvantXeloda 2000 mg BID one week on, one week off since 07/31/2018. Plan for 6 months. -He is tolerating moderately well, he has developed moderate diarrhea last week, improved this week. Due to his advanced age, and a diarrhea, I will decrease Xeloda to 2000mg  am and 1500mg  pm, continue one week on and one week off  -I suggest OTC Multivitamin for his finger nails. He has very mild neuropathy of his fingertips and fatigue. I encouraged him to remain active as much as he can. His diarrhea is moderate but occasional and overall manageable. He will continue with Imodium.  -Labs reviewed, CBC WNL except mild anemia, CMP WNL. Overall adequate to continue treatment with Xeloda, plan to complete 6 months treatment in April.  -F/u in 4 weeks  -plan to repeat CT scan after he complete adjuvant Xeloda    2. Anemia, due to chronic blood lossand iron deficiency -He is currently on oral iron. Improved after surgery  -Labs reviewed, Hg 12.9 today (12/08/18), anemia resolved   3. CAD S/P CABG -f/u with PCP -  stable, no chest pain    4. Neuropathy  -he had neuropathy in feet prior to chemo -He developed mild neuropathy of fingertip recently, secondary to Xeloda. Will monitor.  -Currently on Gabapentin 100mg  bid, controlled, refilled today. Will continue     Plan  -I refilled Gabapentin today  -Labs reviewed and adequate to continue Xeloda to 2000mg  am and 1500mg  pm, one week on and one week off  -Lab and f/u in 4 weeks     No problem-specific Assessment & Plan notes found for this encounter.   No orders of the defined types were placed in this encounter.  All questions were answered. The patient knows to call the clinic with any problems, questions or concerns. No barriers to learning was detected. I spent 20 minutes counseling the patient face to face. The total time spent in the appointment was 25 minutes and more than 50% was on counseling and review of test results     Truitt Merle, MD 12/08/2018   I, Joslyn Devon, am acting as scribe for Truitt Merle, MD.   I have reviewed the above documentation for accuracy and completeness, and I agree with the above.

## 2018-12-08 ENCOUNTER — Inpatient Hospital Stay (HOSPITAL_BASED_OUTPATIENT_CLINIC_OR_DEPARTMENT_OTHER): Payer: Medicare HMO | Admitting: Hematology

## 2018-12-08 ENCOUNTER — Telehealth: Payer: Self-pay | Admitting: Hematology

## 2018-12-08 ENCOUNTER — Encounter: Payer: Self-pay | Admitting: Hematology

## 2018-12-08 ENCOUNTER — Inpatient Hospital Stay: Payer: Medicare HMO

## 2018-12-08 VITALS — BP 137/72 | HR 92 | Temp 97.5°F | Resp 18 | Ht 71.0 in | Wt 215.7 lb

## 2018-12-08 DIAGNOSIS — D500 Iron deficiency anemia secondary to blood loss (chronic): Secondary | ICD-10-CM

## 2018-12-08 DIAGNOSIS — C182 Malignant neoplasm of ascending colon: Secondary | ICD-10-CM

## 2018-12-08 DIAGNOSIS — G629 Polyneuropathy, unspecified: Secondary | ICD-10-CM

## 2018-12-08 LAB — CBC WITH DIFFERENTIAL (CANCER CENTER ONLY)
Abs Immature Granulocytes: 0.01 10*3/uL (ref 0.00–0.07)
Basophils Absolute: 0 10*3/uL (ref 0.0–0.1)
Basophils Relative: 0 %
Eosinophils Absolute: 0.1 10*3/uL (ref 0.0–0.5)
Eosinophils Relative: 2 %
HCT: 40 % (ref 39.0–52.0)
Hemoglobin: 12.9 g/dL — ABNORMAL LOW (ref 13.0–17.0)
Immature Granulocytes: 0 %
Lymphocytes Relative: 29 %
Lymphs Abs: 1.3 10*3/uL (ref 0.7–4.0)
MCH: 30.6 pg (ref 26.0–34.0)
MCHC: 32.3 g/dL (ref 30.0–36.0)
MCV: 94.8 fL (ref 80.0–100.0)
Monocytes Absolute: 0.4 10*3/uL (ref 0.1–1.0)
Monocytes Relative: 9 %
Neutro Abs: 2.7 10*3/uL (ref 1.7–7.7)
Neutrophils Relative %: 60 %
Platelet Count: 161 10*3/uL (ref 150–400)
RBC: 4.22 MIL/uL (ref 4.22–5.81)
RDW: 17.8 % — ABNORMAL HIGH (ref 11.5–15.5)
WBC Count: 4.5 10*3/uL (ref 4.0–10.5)
nRBC: 0 % (ref 0.0–0.2)

## 2018-12-08 LAB — CMP (CANCER CENTER ONLY)
ALK PHOS: 98 U/L (ref 38–126)
AST: 14 U/L — ABNORMAL LOW (ref 15–41)
Albumin: 3.9 g/dL (ref 3.5–5.0)
Anion gap: 9 (ref 5–15)
BUN: 15 mg/dL (ref 8–23)
CALCIUM: 9.1 mg/dL (ref 8.9–10.3)
CO2: 26 mmol/L (ref 22–32)
Chloride: 105 mmol/L (ref 98–111)
Creatinine: 1.1 mg/dL (ref 0.61–1.24)
GFR, Est AFR Am: >60 mL/min (ref >60)
GFR, Estimated: >60 mL/min (ref >60)
Glucose, Bld: 141 mg/dL — ABNORMAL HIGH (ref 70–99)
Potassium: 4.3 mmol/L (ref 3.5–5.1)
Sodium: 140 mmol/L (ref 135–145)
Total Bilirubin: 0.9 mg/dL (ref 0.3–1.2)
Total Protein: 6.8 g/dL (ref 6.5–8.1)

## 2018-12-08 MED ORDER — GABAPENTIN 100 MG PO CAPS: 100.0000 mg | ORAL_CAPSULE | Freq: Two times a day (BID) | ORAL | 2 refills | Status: DC

## 2018-12-08 NOTE — Telephone Encounter (Signed)
Gave avs and calendar ° °

## 2018-12-11 ENCOUNTER — Telehealth: Payer: Self-pay | Admitting: Hematology

## 2018-12-11 MED FILL — CAPECITABINE 500 MG TABS: 500 | 28 days supply | Qty: 112 | Fill #0

## 2018-12-11 NOTE — Telephone Encounter (Signed)
Called patient per 2/28 VM log.  Patient stated he had to many appts and wanted to cancel one of his appts.  Per patient request cancelled appts for 3/11.

## 2018-12-15 DIAGNOSIS — H02102 Unspecified ectropion of right lower eyelid: Secondary | ICD-10-CM | POA: Diagnosis not present

## 2018-12-15 DIAGNOSIS — H02105 Unspecified ectropion of left lower eyelid: Secondary | ICD-10-CM | POA: Diagnosis not present

## 2018-12-15 DIAGNOSIS — H02106 Unspecified ectropion of left eye, unspecified eyelid: Secondary | ICD-10-CM | POA: Diagnosis not present

## 2018-12-15 DIAGNOSIS — H04123 Dry eye syndrome of bilateral lacrimal glands: Secondary | ICD-10-CM | POA: Diagnosis not present

## 2018-12-20 ENCOUNTER — Ambulatory Visit: Payer: Medicare HMO | Admitting: Hematology

## 2018-12-20 ENCOUNTER — Other Ambulatory Visit: Payer: Medicare HMO

## 2019-01-08 NOTE — Progress Notes (Signed)
Vincent Mcclure   [phone] [phone]   Clinic Follow up Note   Patient Care Team: [name], DO as PCP - General (Family Medicine) Pyrtle, Lajuan Lines, MD as Consulting Physician (Gastroenterology) Allyn Kenner, MD as Consulting Physician (Dermatology) Dorothy Spark, MD as Consulting Physician (Cardiology) Levin Erp, [place] as Physician Assistant (Gastroenterology) Murrell Converse as Physician Assistant (Cardiology)  Date of Service:  01/09/2019  CHIEF COMPLAINT: F/u on stage III colon cancer  SUMMARY OF ONCOLOGIC HISTORY: Oncology History   Cancer Staging Cancer of ascending colon pT4apN1 s/p right colectomy 05/19/2018 Staging form: Colon and Rectum, AJCC 8th Edition - Pathologic stage from 05/19/2018: Stage IIIB (pT4a, pN1, cM0) - Signed by Truitt Merle, MD on 05/24/2018       Cancer of ascending colon pT4apN1 s/p right colectomy 05/19/2018   05/18/2018 Imaging    05/18/2018 CT AP IMPRESSION: 1. There appears to be a nearly obstructing lesion in the region of the hepatic flexure of the colon which is highly concerning for primary colonic neoplasm. Slight haziness of the surrounding soft tissues may be indicative of early local invasion. This appears partially obstructive as evidenced by dilatation of more proximal aspects of the small bowel and colon. 2. Left adrenal lesion is stable compared to prior studies, previously characterized as an adenoma. 3. Aortic atherosclerosis, in addition to left main and 3 vessel coronary artery disease. Status post median sternotomy for CABG. 4. Additional incidental findings, as above.  Aortic Atherosclerosis (ICD10-I70.0).    05/18/2018 - 05/30/2018 Hospital Admission    Admit date: 05/18/2018 Admission diagnosis: Colon Cancer Discharged on: 05/30/2018    05/19/2018 Cancer Staging    Staging form: Colon and Rectum, AJCC 8th Edition - Pathologic stage from 05/19/2018: Stage IIIB (pT4a, pN1,  cM0) - Signed by Truitt Merle, MD on 05/24/2018     05/19/2018 Pathology Results    05/19/2018 Surgical Pathology Diagnosis Colon, segmental resection for tumor, right - INVASIVE COLORECTAL ADENOCARCINOMA, 5 CM. - CARCINOMA FOCALLY LESS THAN 0.1 CM FROM SEROSAL SURFACE. - MARGINS NOT INVOLVED. - METASTATIC CARCINOMA IN ONE OF EIGHT LYMPH NODES (1/9). - BENIGN APPENDIX.    05/19/2018 Surgery    EXPLORATORY LAPAROTOMY with right colectomy with Fanny Skates, MD    05/24/2018 Initial Diagnosis    Cancer of right colon ([place])    06/12/2018 Imaging     06/12/2018 CT CAP IMPRESSION: 1. Evidence all bowel perforation/anastomosed breakdown with large volume intraperitoneal free air fluid in the RIGHT upper quadrant adjacent and above the liver. 2. Large loculated fluid collection in the RIGHT anterior pelvis extending along the LEFT pericolic gutter consists with PERITONEAL ABSCESS. 3. Of note, no large volume intraperitoneal free fluid. Oral contrast passed through the RIGHT colon anastomosis without evidence of leak. Findings could indicate a prior anastomotic breakdown which has healed in the interval with subsequent abscess formation from the prior leak / breakdown.    06/17/2018 Procedure    06/17/2018 US Thoracentesis IMPRESSION: Successful ultrasound guided right thoracentesis yielding 1 L of pleural fluid. No pneumothorax on post-procedure chest x-ray.  No malignancy on pathology    06/20/2018 Imaging    06/20/2018 CT AP IMPRESSION: 1. Decompression of right upper quadrant air and fluid collection after percutaneous catheter drainage. There remains a tiny rim of fluid as well as small extraluminal gas bubbles in this region suspicious for continued leak from bowel. 2. Further decrease in size of residual abscess fluid collection in the pelvis after percutaneous catheter  drainage. There remains some fluid around the drainage catheter in the right pelvis.    07/04/2018 Imaging     07/04/2018 CT AP IMPRESSION: 1. Further decompression of right perihepatic abscess with resolution of extraluminal air. 2. Relatively stable volume of fluid surrounding the right pelvic drainage catheter.    07/05/2018 Imaging    07/05/2018 CT AP IMPRESSION: 1. No change compared to the CT scan performed 9 hours earlier. 2. Anterior right perihepatic space collection is decompressed by indwelling percutaneous drain, with no residual measurable collection in this location. 3. Thick walled right lower quadrant collection with indwelling percutaneous drain is stable. No new focal fluid collections. 4. Stable postsurgical changes from right hemicolectomy with no evidence of bowel obstruction. Stable reactive wall thickening in the sigmoid colon adjacent to the right lower quadrant collection. 5. Stable small to moderate dependent right pleural effusion. 6. Stable left adrenal adenoma. 7.  Aortic Atherosclerosis (ICD10-I70.0).    07/31/2018 -  Chemotherapy    Adjuvant Xeloda 2000mg  bid, 7 days on, 7 days off.  Reduced to 2000mg  in the AM and 1500mg  in the PM due to diarrhea. Completed on 01/21/19       CURRENT THERAPY:  Xeloda2000mg  BIDone week on,one week offstarted 07/31/2018. Reduced to 2000mg  in the AM and 1500mg  in the PM due to diarrhea.   INTERVAL HISTORY:  Vincent Mcclure is here for a follow up of treatment. He presents to the clinic today by himself. He notes he is doing fairly. He notes nail changes. His skin is otherwise doing well. He notes mild neuropathy. He takes Amitriptyline to help with any tingling on his toes at night. He denies abdominal issues. He takes Miralax every 2 days to keep his bowels moving. He will have a bowel movement 1-2 times a week. He notes he has been watching is weight.    REVIEW OF SYSTEMS:   Constitutional: Denies fevers, chills or abnormal weight loss Eyes: Denies blurriness of vision Ears, nose, mouth, throat, and face: Denies mucositis  or sore throat Respiratory: Denies cough, dyspnea or wheezes Cardiovascular: Denies palpitation, chest discomfort or lower extremity swelling Gastrointestinal:  Denies nausea, heartburn (+) Constipation, controlled  Skin: Denies abnormal skin rashes (+) Nail changes  Lymphatics: Denies new lymphadenopathy or easy bruising Neurological:Denies new weaknesses (+) Mild neuropathy of fingers and toes Behavioral/Psych: Mood is stable, no new changes  All other systems were reviewed with the patient and are negative.  MEDICAL HISTORY:  Past Medical History:  Diagnosis Date  . Alcohol abuse   . Anemia   . Chronic low back pain 10/14/2011  . Colon polyps    hyperplastic (2004, 2010) and adenomatous (1990).    . COLONIC POLYPS, HX OF 03/05/2008  . Esophageal stricture    hx of  . Gastric ulcer   . GERD 03/05/2008  . GERD (gastroesophageal reflux disease) 1994   associated peptic strictures  . HYPERLIPIDEMIA 03/05/2008  . HYPERTENSION 03/05/2008  . Hypertension   . IBS 03/05/2008  . Impaired glucose tolerance 10/12/2011  . Iron deficiency anemia   . LEG CRAMPS 09/02/2010  . PAD (peripheral artery disease) ([place]) 10/14/2011  . PAD (peripheral artery disease) ([place]) 2013  . PERIPHERAL EDEMA 09/02/2010  . PERIPHERAL NEUROPATHY 03/05/2008  . Prostatitis    hx of  . VARICOSE VEINS, LOWER EXTREMITIES 06/09/2009    SURGICAL HISTORY: Past Surgical History:  Procedure Laterality Date  . CATARACT EXTRACTION  bilat  . COLECTOMY    . CORONARY ARTERY BYPASS GRAFT  N/A 02/15/2017   Procedure: CORONARY ARTERY BYPASS GRAFTING times three  with left internal mammary harvest and endoscopic harvest of Right SVG. Grafts of LIMA to  LAD, SVG to Distal Circ, and to First Diag.;  Surgeon: Grace Isaac, MD;  Location: [facility];  Service: Open Heart Surgery;  Laterality: N/A;  . ESOPHAGOGASTRODUODENOSCOPY N/A 11/14/2014   Procedure: ESOPHAGOGASTRODUODENOSCOPY (EGD);  Surgeon: Jerene Bears, MD;  Location: [facility] ENDOSCOPY;   Service: Endoscopy;  Laterality: N/A;  . IR RADIOLOGIST EVAL & MGMT  07/04/2018  . IR RADIOLOGIST EVAL & MGMT  07/18/2018  . LAPAROTOMY N/A 05/19/2018   Procedure: EXPLORATORY LAPAROTOMY RIGHT COLECTOMY;  Surgeon: Fanny Skates, MD;  Location: [facility];  Service: General;  Laterality: N/A;  . LEFT HEART CATH AND CORONARY ANGIOGRAPHY N/A 02/14/2017   Procedure: Left Heart Cath and Coronary Angiography;  Surgeon: Belva Crome, MD;  Location: [facility];  Service: Cardiovascular;  Laterality: N/A;  . LUMBAR SPINE SURGERY  11/2008   Dr Joya Salm  . TEE WITHOUT CARDIOVERSION N/A 02/15/2017   Procedure: TRANSESOPHAGEAL ECHOCARDIOGRAM (TEE);  Surgeon: Grace Isaac, MD;  Location: [facility];  Service: Open Heart Surgery;  Laterality: N/A;    I have reviewed the social history and family history with the patient and they are unchanged from previous note.  ALLERGIES:  is allergic to no known allergies.  MEDICATIONS:  Current Outpatient Medications  Medication Sig Dispense Refill  . acetaminophen (TYLENOL) 500 MG tablet Take 1 tablet (500 mg total) by mouth every 6 (six) hours as needed for mild pain.  0  . aspirin EC 81 MG EC tablet Take 1 tablet (81 mg total) by mouth daily. 30 tablet 1  . ferrous sulfate 325 (65 FE) MG EC tablet Take 1 tablet (325 mg total) by mouth 3 (three) times daily with meals. (Patient taking differently: Take 325 mg by mouth daily with breakfast. ) 90 tablet 11  . furosemide (LASIX) 40 MG tablet Take 40 mg (1 tab) twice daily for 2 days, then take 40 mg (1 tab) daily 90 tablet 3  . gabapentin (NEURONTIN) 100 MG capsule Take 1 capsule (100 mg total) by mouth 2 (two) times daily. 60 capsule 2  . Multiple Vitamin (MULTIVITAMIN WITH MINERALS) TABS tablet Take 1 tablet by mouth daily.    . pravastatin (PRAVACHOL) 40 MG tablet Take 1 tablet (40 mg total) by mouth at bedtime. 90 tablet 1  . Probiotic Product (ALIGN PO) Take by mouth.    . vitamin C (ASCORBIC ACID) 500 MG tablet  Take 500 mg by mouth 2 (two) times daily.    . capecitabine (XELODA) 500 MG tablet Take 2000 mg in am and 1500 mg in pm, one week on and one week off 30 tablet 0  . metoprolol succinate (TOPROL-XL) 50 MG 24 hr tablet Take 1 tablet (50 mg total) by mouth daily. Please make overdue appt for future refills. [phone]. 2nd attempt. (Patient not taking: Reported on 12/08/2018) 15 tablet 0   No current facility-administered medications for this visit.     PHYSICAL EXAMINATION: ECOG PERFORMANCE STATUS: 1 - Symptomatic but completely ambulatory  Vitals:   01/09/19 1244  BP: (!) 146/89  Pulse: 92  Resp: 17  Temp: 97.9 F (36.6 C)  SpO2: 100%   Filed Weights   01/09/19 1244  Weight: 212 lb 8 oz (96.4 kg)    GENERAL:alert, no distress and comfortable SKIN: skin color, texture, turgor are normal, no rashes or significant  lesions EYES: normal, Conjunctiva are pink and non-injected, sclera clear OROPHARYNX:no exudate, no erythema and lips, buccal mucosa, and tongue normal  NECK: supple, thyroid normal size, non-tender, without nodularity LYMPH:  no palpable lymphadenopathy in the cervical, axillary or inguinal LUNGS: clear to auscultation and percussion with normal breathing effort HEART: regular rate & rhythm and no murmurs and no lower extremity edema ABDOMEN:abdomen soft, non-tender and normal bowel sounds Musculoskeletal:no cyanosis of digits and no clubbing  NEURO: alert & oriented x 3 with fluent speech, no focal motor/sensory deficits  LABORATORY DATA:  I have reviewed the data as listed CBC Latest Ref Rng & Units 01/09/2019 12/08/2018 11/14/2018  WBC 4.0 - 10.5 K/uL 4.2 4.5 5.2  Hemoglobin 13.0 - 17.0 g/dL 13.3 12.9(L) 12.5(L)  Hematocrit 39.0 - 52.0 % 39.5 40.0 38.3(L)  Platelets 150 - 400 K/uL 183 161 190     CMP Latest Ref Rng & Units 01/09/2019 12/08/2018 11/14/2018  Glucose 70 - 99 mg/dL 97 141(H) 112(H)  BUN 8 - 23 mg/dL 12 15 23   Creatinine 0.61 - 1.24 mg/dL 0.95 1.10 1.23   Sodium 135 - 145 mmol/L 140 140 142  Potassium 3.5 - 5.1 mmol/L 3.7 4.3 3.6  Chloride 98 - 111 mmol/L 105 105 105  CO2 22 - 32 mmol/L 25 26 29   Calcium 8.9 - 10.3 mg/dL 9.3 9.1 9.4  Total Protein 6.5 - 8.1 g/dL 7.0 6.8 6.9  Total Bilirubin 0.3 - 1.2 mg/dL 0.7 0.9 0.8  Alkaline Phos 38 - 126 U/L 91 98 89  AST 15 - 41 U/L 16 14(L) 12(L)  ALT 0 - 44 U/L 10 <6 7      RADIOGRAPHIC STUDIES: I have personally reviewed the radiological images as listed and agreed with the findings in the report. No results found.   ASSESSMENT & PLAN:  Vincent Mcclure is a 85 y.o. male with   1. Adenocarcinoma of right colon, invasive adenocarcinoma, stage IIIB (pT4a, pN1, cM0), MSS -Diagnosed in 05/2018. Treated with hemicolectomy. He is currently onadjuvantXeloda 2000 mg BID one week on, one week off since 07/31/2018. Plan for 6 months. -He is tolerating moderately well. I previously dose reduced Xeloda to 2000mg  am and 1500mg  pm, continue one week on and one week offdue to moderate diarrhea. Diarrhea and neuropathy now manageable. -He is clinically doing well, with stable mild neuropathy and controlled constipation. Labs reviewed, CBC WNL, CMP WNL. Ferritin and CEA are still pending. Overall adequate to continue treatment.  -He is nearing the end of Xeloda treatment. He is currently off treatment this week. He will continue for 1 more week before completing on 01/21/19.  -We again reviewed the risk of recurrence, and surveillance plan.  Plan to repeat CT scan in 3 months for cancer surveillance -I reviewed COVID-19 precautions and encouraged social distancing.  Due to his advanced age and compromised immune system from chemotherapy, he is at high risk for infection -f/u in 3 months     2. Anemia, due to chronic blood lossand iron deficiency -He is currently on oral iron.Improved after surgery -Anemia currently resolved with hg of 13.3 (01/09/19)  3. CAD S/P CABG -f/u with PCP -stable, no chest  pain    4. Neuropathy, G1 -he had neuropathy in feet prior to chemo -mild neuropathy of fingertip, slightly worse with Xeloda. Will monitor.  -Currently on Gabapentin 100mg  bid. Will continue    Plan -Labs reviewed and adequate to proceed with 1 more week of Xeloda starting Monday 01/15/2019.  -  I refilled Xeloda today for #30 tabs (he has 20 tabs at home)  -F/u in 3 months with lab and CT CAP with contrast a few days before    No problem-specific Assessment & Plan notes found for this encounter.   Orders Placed This Encounter  Procedures  . CT Abdomen Pelvis W Contrast    Standing Status:   Future    Standing Expiration Date:   01/09/2020    Order Specific Question:   If indicated for the ordered procedure, I authorize the administration of contrast media per Radiology protocol    Answer:   Yes    Order Specific Question:   Preferred imaging location?    Answer:   [facility]    Order Specific Question:   Is Oral Contrast requested for this exam?    Answer:   Yes, Per Radiology protocol    Order Specific Question:   Radiology Contrast Protocol - do NOT remove file path    Answer:   \\[email or web address]  . CT Chest W Contrast    Standing Status:   Future    Standing Expiration Date:   01/09/2020    Order Specific Question:   If indicated for the ordered procedure, I authorize the administration of contrast media per Radiology protocol    Answer:   Yes    Order Specific Question:   Preferred imaging location?    Answer:   [facility]    Order Specific Question:   Radiology Contrast Protocol - do NOT remove file path    Answer:   \\[email or web address]   All questions were answered. The patient knows to call the clinic with any problems, questions or concerns. No barriers to learning was detected. I spent 20 minutes counseling the patient face to face. The total time spent in the appointment was 25 minutes and more  than 50% was on counseling and review of test results     Truitt Merle, MD 01/09/2019   I, Joslyn Devon, am acting as scribe for Truitt Merle, MD.   I have reviewed the above documentation for accuracy and completeness, and I agree with the above.

## 2019-01-09 ENCOUNTER — Other Ambulatory Visit: Payer: Self-pay | Admitting: *Deleted

## 2019-01-09 ENCOUNTER — Inpatient Hospital Stay: Payer: Medicare HMO

## 2019-01-09 ENCOUNTER — Encounter: Payer: Self-pay | Admitting: Hematology

## 2019-01-09 ENCOUNTER — Other Ambulatory Visit: Payer: Self-pay

## 2019-01-09 ENCOUNTER — Inpatient Hospital Stay: Payer: Medicare HMO | Attending: Nurse Practitioner | Admitting: Hematology

## 2019-01-09 VITALS — BP 146/89 | HR 92 | Temp 97.9°F | Resp 17 | Ht 71.0 in | Wt 212.5 lb

## 2019-01-09 DIAGNOSIS — C182 Malignant neoplasm of ascending colon: Secondary | ICD-10-CM | POA: Insufficient documentation

## 2019-01-09 DIAGNOSIS — G629 Polyneuropathy, unspecified: Secondary | ICD-10-CM | POA: Diagnosis not present

## 2019-01-09 DIAGNOSIS — D509 Iron deficiency anemia, unspecified: Secondary | ICD-10-CM | POA: Diagnosis not present

## 2019-01-09 DIAGNOSIS — I2510 Atherosclerotic heart disease of native coronary artery without angina pectoris: Secondary | ICD-10-CM

## 2019-01-09 LAB — CBC WITH DIFFERENTIAL (CANCER CENTER ONLY)
Abs Immature Granulocytes: 0 10*3/uL (ref 0.00–0.07)
Basophils Absolute: 0 10*3/uL (ref 0.0–0.1)
Basophils Relative: 0 %
EOS ABS: 0.1 10*3/uL (ref 0.0–0.5)
Eosinophils Relative: 2 %
HCT: 39.5 % (ref 39.0–52.0)
Hemoglobin: 13.3 g/dL (ref 13.0–17.0)
Immature Granulocytes: 0 %
Lymphocytes Relative: 37 %
Lymphs Abs: 1.5 10*3/uL (ref 0.7–4.0)
MCH: 31.7 pg (ref 26.0–34.0)
MCHC: 33.7 g/dL (ref 30.0–36.0)
MCV: 94 fL (ref 80.0–100.0)
Monocytes Absolute: 0.5 10*3/uL (ref 0.1–1.0)
Monocytes Relative: 12 %
Neutro Abs: 2.1 10*3/uL (ref 1.7–7.7)
Neutrophils Relative %: 49 %
Platelet Count: 183 10*3/uL (ref 150–400)
RBC: 4.2 MIL/uL — ABNORMAL LOW (ref 4.22–5.81)
RDW: 15.7 % — ABNORMAL HIGH (ref 11.5–15.5)
WBC: 4.2 10*3/uL (ref 4.0–10.5)
nRBC: 0 % (ref 0.0–0.2)

## 2019-01-09 LAB — CMP (CANCER CENTER ONLY)
ALK PHOS: 91 U/L (ref 38–126)
ALT: 10 U/L (ref 0–44)
AST: 16 U/L (ref 15–41)
Albumin: 4.1 g/dL (ref 3.5–5.0)
Anion gap: 10 (ref 5–15)
BUN: 12 mg/dL (ref 8–23)
CO2: 25 mmol/L (ref 22–32)
Calcium: 9.3 mg/dL (ref 8.9–10.3)
Chloride: 105 mmol/L (ref 98–111)
Creatinine: 0.95 mg/dL (ref 0.61–1.24)
GFR, Est AFR Am: >60 mL/min (ref >60)
GFR, Estimated: >60 mL/min (ref >60)
Glucose, Bld: 97 mg/dL (ref 70–99)
Potassium: 3.7 mmol/L (ref 3.5–5.1)
Sodium: 140 mmol/L (ref 135–145)
Total Bilirubin: 0.7 mg/dL (ref 0.3–1.2)
Total Protein: 7 g/dL (ref 6.5–8.1)

## 2019-01-09 LAB — CEA (IN HOUSE-CHCC): CEA (CHCC-In House): 1.95 ng/mL (ref 0.00–5.00)

## 2019-01-09 LAB — FERRITIN: Ferritin: 149 ng/mL (ref 24–336)

## 2019-01-09 MED ORDER — CAPECITABINE 500 MG PO TABS: ORAL_TABLET | ORAL | 0 refills | Status: DC

## 2019-01-10 MED FILL — CAPECITABINE 500 MG TABS: 500 | 4 days supply | Qty: 18 | Fill #0

## 2019-01-15 ENCOUNTER — Other Ambulatory Visit: Payer: Self-pay

## 2019-01-15 ENCOUNTER — Telehealth: Payer: Self-pay | Admitting: Cardiology

## 2019-01-15 MED ORDER — METOPROLOL SUCCINATE ER 50 MG PO TB24: 50.0000 mg | ORAL_TABLET | Freq: Every day | ORAL | 0 refills | Status: DC

## 2019-01-15 NOTE — Telephone Encounter (Signed)
New Message     *STAT* If patient is at the pharmacy, call can be transferred to refill team.   1. Which medications need to be refilled? (please list name of each medication and dose if known) Metoprolol   2. Which pharmacy/location (including street and city if local pharmacy) is medication to be sent to? [facility] on [address]  3. Do they need a 30 day or 90 day supply? 30 or 90    Pt states his BP has not been very good and needs to see the Dr soon. He doesn't know if a virtual visit would work for him   Please call

## 2019-01-25 ENCOUNTER — Other Ambulatory Visit: Payer: Self-pay

## 2019-01-25 ENCOUNTER — Other Ambulatory Visit: Payer: Self-pay | Admitting: *Deleted

## 2019-01-25 ENCOUNTER — Other Ambulatory Visit: Payer: Self-pay | Admitting: Cardiology

## 2019-01-25 MED ORDER — FUROSEMIDE 40 MG PO TABS: ORAL_TABLET | ORAL | 1 refills | Status: DC

## 2019-01-25 NOTE — Telephone Encounter (Signed)
Please refill, thank you!

## 2019-01-25 NOTE — Telephone Encounter (Signed)
Vincent Mcclure is requesting refill for Furosemide 40 mg. Per epic pt last ov was 08/10/17. Doesn't not have upcoming appt schedule. Per epic note from 01/15/19, pt called in to say that BP was high and doesn't think that a virtual visit would work for him. Please advise. Thank you.

## 2019-01-26 ENCOUNTER — Other Ambulatory Visit: Payer: Self-pay | Admitting: Cardiology

## 2019-02-28 ENCOUNTER — Other Ambulatory Visit: Payer: Self-pay | Admitting: Family Medicine

## 2019-02-28 DIAGNOSIS — D508 Other iron deficiency anemias: Secondary | ICD-10-CM

## 2019-03-08 ENCOUNTER — Other Ambulatory Visit: Payer: Self-pay | Admitting: Family Medicine

## 2019-03-08 DIAGNOSIS — E782 Mixed hyperlipidemia: Secondary | ICD-10-CM

## 2019-03-08 DIAGNOSIS — I25810 Atherosclerosis of coronary artery bypass graft(s) without angina pectoris: Secondary | ICD-10-CM

## 2019-03-12 ENCOUNTER — Other Ambulatory Visit: Payer: Self-pay | Admitting: Family Medicine

## 2019-03-12 DIAGNOSIS — E782 Mixed hyperlipidemia: Secondary | ICD-10-CM

## 2019-03-12 DIAGNOSIS — I25810 Atherosclerosis of coronary artery bypass graft(s) without angina pectoris: Secondary | ICD-10-CM

## 2019-03-22 ENCOUNTER — Other Ambulatory Visit: Payer: Self-pay | Admitting: Family Medicine

## 2019-03-22 DIAGNOSIS — Z951 Presence of aortocoronary bypass graft: Secondary | ICD-10-CM | POA: Diagnosis not present

## 2019-03-22 DIAGNOSIS — T8143XA Infection following a procedure, organ and space surgical site, initial encounter: Secondary | ICD-10-CM | POA: Diagnosis not present

## 2019-03-22 DIAGNOSIS — I25810 Atherosclerosis of coronary artery bypass graft(s) without angina pectoris: Secondary | ICD-10-CM

## 2019-03-22 DIAGNOSIS — I4891 Unspecified atrial fibrillation: Secondary | ICD-10-CM | POA: Diagnosis not present

## 2019-03-22 DIAGNOSIS — C182 Malignant neoplasm of ascending colon: Secondary | ICD-10-CM | POA: Diagnosis not present

## 2019-03-22 DIAGNOSIS — I10 Essential (primary) hypertension: Secondary | ICD-10-CM | POA: Diagnosis not present

## 2019-03-22 DIAGNOSIS — E782 Mixed hyperlipidemia: Secondary | ICD-10-CM

## 2019-04-06 ENCOUNTER — Other Ambulatory Visit: Payer: Medicare HMO

## 2019-04-09 ENCOUNTER — Ambulatory Visit: Payer: Medicare HMO | Admitting: Hematology

## 2019-04-10 ENCOUNTER — Other Ambulatory Visit: Payer: Self-pay

## 2019-04-10 ENCOUNTER — Inpatient Hospital Stay: Payer: Medicare HMO | Attending: Hematology

## 2019-04-10 DIAGNOSIS — C182 Malignant neoplasm of ascending colon: Secondary | ICD-10-CM | POA: Diagnosis not present

## 2019-04-10 DIAGNOSIS — D509 Iron deficiency anemia, unspecified: Secondary | ICD-10-CM | POA: Diagnosis not present

## 2019-04-10 LAB — CBC WITH DIFFERENTIAL (CANCER CENTER ONLY)
Abs Immature Granulocytes: 0.01 10*3/uL (ref 0.00–0.07)
Basophils Absolute: 0 10*3/uL (ref 0.0–0.1)
Basophils Relative: 0 %
Eosinophils Absolute: 0.1 10*3/uL (ref 0.0–0.5)
Eosinophils Relative: 1 %
HCT: 45.5 % (ref 39.0–52.0)
Hemoglobin: 14.7 g/dL (ref 13.0–17.0)
Immature Granulocytes: 0 %
Lymphocytes Relative: 37 %
Lymphs Abs: 1.9 10*3/uL (ref 0.7–4.0)
MCH: 29.3 pg (ref 26.0–34.0)
MCHC: 32.3 g/dL (ref 30.0–36.0)
MCV: 90.6 fL (ref 80.0–100.0)
Monocytes Absolute: 0.6 10*3/uL (ref 0.1–1.0)
Monocytes Relative: 13 %
Neutro Abs: 2.5 10*3/uL (ref 1.7–7.7)
Neutrophils Relative %: 49 %
Platelet Count: 154 10*3/uL (ref 150–400)
RBC: 5.02 MIL/uL (ref 4.22–5.81)
RDW: 13.5 % (ref 11.5–15.5)
WBC Count: 5.1 10*3/uL (ref 4.0–10.5)
nRBC: 0 % (ref 0.0–0.2)

## 2019-04-10 LAB — CMP (CANCER CENTER ONLY)
ALT: 15 U/L (ref 0–44)
AST: 19 U/L (ref 15–41)
Albumin: 4.1 g/dL (ref 3.5–5.0)
Alkaline Phosphatase: 102 U/L (ref 38–126)
Anion gap: 9 (ref 5–15)
BUN: 13 mg/dL (ref 8–23)
CO2: 26 mmol/L (ref 22–32)
Calcium: 8.9 mg/dL (ref 8.9–10.3)
Chloride: 105 mmol/L (ref 98–111)
Creatinine: 1.09 mg/dL (ref 0.61–1.24)
GFR, Est AFR Am: >60 mL/min (ref >60)
GFR, Estimated: >60 mL/min (ref >60)
Glucose, Bld: 80 mg/dL (ref 70–99)
Potassium: 4.3 mmol/L (ref 3.5–5.1)
Sodium: 140 mmol/L (ref 135–145)
Total Bilirubin: 0.7 mg/dL (ref 0.3–1.2)
Total Protein: 7.1 g/dL (ref 6.5–8.1)

## 2019-04-10 LAB — CEA (IN HOUSE-CHCC): CEA (CHCC-In House): 1.61 ng/mL (ref 0.00–5.00)

## 2019-04-10 LAB — FERRITIN: Ferritin: 123 ng/mL (ref 24–336)

## 2019-04-11 ENCOUNTER — Telehealth: Payer: Self-pay

## 2019-04-11 NOTE — Telephone Encounter (Signed)
Spoke to patient regarding lab results, per Cira Rue NP CBC, CMP, CEA and ferritin are all normal, patient verbalized an understanding and has CT scan scheduled for tomorrow.

## 2019-04-11 NOTE — Telephone Encounter (Signed)
-----   Message from Alla Feeling, NP sent at 04/10/2019  3:33 PM EDT ----- Let him know CBC, CMP, CEA, and ferritin all normal. Keep appointments for CT and f/u with Dr. [name].  Thanks, Regan Rakers NP

## 2019-04-12 ENCOUNTER — Other Ambulatory Visit: Payer: Self-pay

## 2019-04-12 ENCOUNTER — Ambulatory Visit: Payer: Medicare HMO | Admitting: Hematology

## 2019-04-12 ENCOUNTER — Ambulatory Visit
Admission: RE | Admit: 2019-04-12 | Discharge: 2019-04-12 | Disposition: A | Discharge status: Home / Self Care | Payer: Medicare HMO | Source: Ambulatory Visit | Attending: Hematology | Admitting: Hematology

## 2019-04-12 DIAGNOSIS — C182 Malignant neoplasm of ascending colon: Secondary | ICD-10-CM

## 2019-04-12 DIAGNOSIS — R911 Solitary pulmonary nodule: Secondary | ICD-10-CM | POA: Diagnosis not present

## 2019-04-12 DIAGNOSIS — C189 Malignant neoplasm of colon, unspecified: Secondary | ICD-10-CM | POA: Diagnosis not present

## 2019-04-12 MED ORDER — IOPAMIDOL (ISOVUE-300) INJECTION 61%
100.0000 mL | Freq: Once | INTRAVENOUS | Status: AC | PRN
  Administered 2019-04-12: 100 mL via INTRAVENOUS

## 2019-04-12 NOTE — Progress Notes (Signed)
[place]   [phone] [phone]) [phone]   Clinic Follow up Note   Patient Care Team: [name], DO as PCP - General (Family Medicine) Pyrtle, Lajuan Lines, MD as Consulting Physician (Gastroenterology) Allyn Kenner, MD as Consulting Physician (Dermatology) Dorothy Spark, MD as Consulting Physician (Cardiology) Levin Erp, [place] as Physician Assistant (Gastroenterology) Murrell Converse as Physician Assistant (Cardiology)  Date of Service:  04/19/2019  CHIEF COMPLAINT: F/u on stage III colon cancer  SUMMARY OF ONCOLOGIC HISTORY: Oncology History Overview Note  Cancer Staging Cancer of ascending colon pT4apN1 s/p right colectomy 05/19/2018 Staging form: Colon and Rectum, AJCC 8th Edition - Pathologic stage from 05/19/2018: Stage IIIB (pT4a, pN1, cM0) - Signed by Truitt Merle, MD on 05/24/2018     Cancer of ascending colon pT4apN1 s/p right colectomy 05/19/2018  05/18/2018 Imaging   05/18/2018 CT AP IMPRESSION: 1. There appears to be a nearly obstructing lesion in the region of the hepatic flexure of the colon which is highly concerning for primary colonic neoplasm. Slight haziness of the surrounding soft tissues may be indicative of early local invasion. This appears partially obstructive as evidenced by dilatation of more proximal aspects of the small bowel and colon. 2. Left adrenal lesion is stable compared to prior studies, previously characterized as an adenoma. 3. Aortic atherosclerosis, in addition to left main and 3 vessel coronary artery disease. Status post median sternotomy for CABG. 4. Additional incidental findings, as above.  Aortic Atherosclerosis (ICD10-I70.0).   05/18/2018 - 05/30/2018 Hospital Admission   Admit date: 05/18/2018 Admission diagnosis: Colon Cancer Discharged on: 05/30/2018   05/19/2018 Cancer Staging   Staging form: Colon and Rectum, AJCC 8th Edition - Pathologic stage from 05/19/2018: Stage IIIB (pT4a, pN1,  cM0) - Signed by Truitt Merle, MD on 05/24/2018    05/19/2018 Pathology Results   05/19/2018 Surgical Pathology Diagnosis Colon, segmental resection for tumor, right - INVASIVE COLORECTAL ADENOCARCINOMA, 5 CM. - CARCINOMA FOCALLY LESS THAN 0.1 CM FROM SEROSAL SURFACE. - MARGINS NOT INVOLVED. - METASTATIC CARCINOMA IN ONE OF EIGHT LYMPH NODES (1/9). - BENIGN APPENDIX.   05/19/2018 Surgery   EXPLORATORY LAPAROTOMY with right colectomy with Fanny Skates, MD   05/24/2018 Initial Diagnosis   Cancer of right colon ([place])   06/12/2018 Imaging    06/12/2018 CT CAP IMPRESSION: 1. Evidence all bowel perforation/anastomosed breakdown with large volume intraperitoneal free air fluid in the RIGHT upper quadrant adjacent and above the liver. 2. Large loculated fluid collection in the RIGHT anterior pelvis extending along the LEFT pericolic gutter consists with PERITONEAL ABSCESS. 3. Of note, no large volume intraperitoneal free fluid. Oral contrast passed through the RIGHT colon anastomosis without evidence of leak. Findings could indicate a prior anastomotic breakdown which has healed in the interval with subsequent abscess formation from the prior leak / breakdown.   06/17/2018 Procedure   06/17/2018 US Thoracentesis IMPRESSION: Successful ultrasound guided right thoracentesis yielding 1 L of pleural fluid. No pneumothorax on post-procedure chest x-ray.  No malignancy on pathology   06/20/2018 Imaging   06/20/2018 CT AP IMPRESSION: 1. Decompression of right upper quadrant air and fluid collection after percutaneous catheter drainage. There remains a tiny rim of fluid as well as small extraluminal gas bubbles in this region suspicious for continued leak from bowel. 2. Further decrease in size of residual abscess fluid collection in the pelvis after percutaneous catheter drainage. There remains some fluid around the drainage catheter in the right pelvis.   07/04/2018 Imaging  07/04/2018 CT AP  IMPRESSION: 1. Further decompression of right perihepatic abscess with resolution of extraluminal air. 2. Relatively stable volume of fluid surrounding the right pelvic drainage catheter.   07/05/2018 Imaging   07/05/2018 CT AP IMPRESSION: 1. No change compared to the CT scan performed 9 hours earlier. 2. Anterior right perihepatic space collection is decompressed by indwelling percutaneous drain, with no residual measurable collection in this location. 3. Thick walled right lower quadrant collection with indwelling percutaneous drain is stable. No new focal fluid collections. 4. Stable postsurgical changes from right hemicolectomy with no evidence of bowel obstruction. Stable reactive wall thickening in the sigmoid colon adjacent to the right lower quadrant collection. 5. Stable small to moderate dependent right pleural effusion. 6. Stable left adrenal adenoma. 7.  Aortic Atherosclerosis (ICD10-I70.0).   07/31/2018 - 01/21/2019 Chemotherapy   Adjuvant Xeloda 2000mg  bid, 7 days on, 7 days off.  Reduced to 2000mg  in the AM and 1500mg  in the PM due to diarrhea. Completed on 01/21/19    04/12/2019 Imaging   CT CAP W contrast 04/12/19  IMPRESSION: 1. Status post right hemicolectomy. 2. No definite metastatic disease in the chest, abdomen, or pelvis. 7 mm right lower lobe pulmonary nodule, obscured by atelectasis on previous chest CT and not included on multiple prior abdomen / pelvis CTs. Close follow-up recommended to ensure stability. 3.  Aortic Atherosclerois (ICD10-170.0)      CURRENT THERAPY:  Xeloda2000mg  BIDone week on,one week offstarted 07/31/2018. Reduced to 2000mg  in the AM and 1500mg  in the PM due to diarrhea.  INTERVAL HISTORY:  Vincent Mcclure is here for a follow up. He presents to the clinic alone. I called his wife to be included in the visit today. He notes 2 episodes of chest pain in the last few weeks. He has used nitroglycerine. He tried to call his  cardiologist, but they were not reached. He notes he had triple bypass surgery in 2013. This is the first time he has been bothered by this. He notes he smoked on and off for 50 years.     REVIEW OF SYSTEMS:   Constitutional: Denies fevers, chills or abnormal weight loss Eyes: Denies blurriness of vision Ears, nose, mouth, throat, and face: Denies mucositis or sore throat Respiratory: Denies cough, dyspnea or wheezes Cardiovascular: Denies palpitation or lower extremity swelling (+) 2 episode of chest pain Gastrointestinal:  Denies nausea, heartburn or change in bowel habits Skin: Denies abnormal skin rashes Lymphatics: Denies new lymphadenopathy or easy bruising Neurological:Denies numbness, tingling or new weaknesses Behavioral/Psych: Mood is stable, no new changes  All other systems were reviewed with the patient and are negative.  MEDICAL HISTORY:  Past Medical History:  Diagnosis Date  . Alcohol abuse   . Anemia   . Chronic low back pain 10/14/2011  . Colon polyps    hyperplastic (2004, 2010) and adenomatous (1990).    . COLONIC POLYPS, HX OF 03/05/2008  . Esophageal stricture    hx of  . Gastric ulcer   . GERD 03/05/2008  . GERD (gastroesophageal reflux disease) 1994   associated peptic strictures  . HYPERLIPIDEMIA 03/05/2008  . HYPERTENSION 03/05/2008  . Hypertension   . IBS 03/05/2008  . Impaired glucose tolerance 10/12/2011  . Iron deficiency anemia   . LEG CRAMPS 09/02/2010  . PAD (peripheral artery disease) ([place]) 10/14/2011  . PAD (peripheral artery disease) ([place]) 2013  . PERIPHERAL EDEMA 09/02/2010  . PERIPHERAL NEUROPATHY 03/05/2008  . Prostatitis    hx of  .  VARICOSE VEINS, LOWER EXTREMITIES 06/09/2009    SURGICAL HISTORY: Past Surgical History:  Procedure Laterality Date  . CATARACT EXTRACTION  bilat  . COLECTOMY    . CORONARY ARTERY BYPASS GRAFT N/A 02/15/2017   Procedure: CORONARY ARTERY BYPASS GRAFTING times three  with left internal mammary harvest and  endoscopic harvest of Right SVG. Grafts of LIMA to  LAD, SVG to Distal Circ, and to First Diag.;  Surgeon: Grace Isaac, MD;  Location: [facility];  Service: Open Heart Surgery;  Laterality: N/A;  . ESOPHAGOGASTRODUODENOSCOPY N/A 11/14/2014   Procedure: ESOPHAGOGASTRODUODENOSCOPY (EGD);  Surgeon: Jerene Bears, MD;  [facility] ENDOSCOPY;  Service: Endoscopy;  Laterality: N/A;  . IR RADIOLOGIST EVAL & MGMT  07/04/2018  . IR RADIOLOGIST EVAL & MGMT  07/18/2018  . LAPAROTOMY N/A 05/19/2018   Procedure: EXPLORATORY LAPAROTOMY RIGHT COLECTOMY;  Surgeon: Fanny Skates, MD;  Location: [facility] ORS;  Service: General;  Laterality: N/A;  . LEFT HEART CATH AND CORONARY ANGIOGRAPHY N/A 02/14/2017   Procedure: Left Heart Cath and Coronary Angiography;  Surgeon: Belva Crome, MD;  Location: [place] [facility];  Service: Cardiovascular;  Laterality: N/A;  . LUMBAR SPINE SURGERY  11/2008   Dr Joya Salm  . TEE WITHOUT CARDIOVERSION N/A 02/15/2017   Procedure: TRANSESOPHAGEAL ECHOCARDIOGRAM (TEE);  Surgeon: Grace Isaac, MD;  Location: [place];  Service: Open Heart Surgery;  Laterality: N/A;    I have reviewed the social history and family history with the patient and they are unchanged from previous note.  ALLERGIES:  is allergic to no known allergies.  MEDICATIONS:  Current Outpatient Medications  Medication Sig Dispense Refill  . acetaminophen (TYLENOL) 500 MG tablet Take 1 tablet (500 mg total) by mouth every 6 (six) hours as needed for mild pain.  0  . aspirin EC 81 MG EC tablet Take 1 tablet (81 mg total) by mouth daily. 30 tablet 1  . Ferrous Sulfate (IRON) 325 (65 Fe) MG TABS TAKE 1 TABLET BY MOUTH THREE TIMES DAILY WITH MEALS 90 tablet 0  . metoprolol succinate (TOPROL-XL) 50 MG 24 hr tablet Take 1 tablet (50 mg total) by mouth daily. Please make overdue appt for future refills. [phone]. 2nd attempt. 90 tablet 0  . Multiple Vitamin (MULTIVITAMIN WITH MINERALS) TABS tablet Take 1 tablet by mouth daily.     . pravastatin (PRAVACHOL) 40 MG tablet Take 1 tablet (40 mg total) by mouth at bedtime. Needs office visit for further refills. 30 tablet 0  . furosemide (LASIX) 40 MG tablet Take 1 tablet by mouth twice daily for 2 days, then once daily by mouth from there afterwards. (Patient not taking: Reported on 04/19/2019) 182 tablet 0  . gabapentin (NEURONTIN) 100 MG capsule Take 1 capsule (100 mg total) by mouth 2 (two) times daily. (Patient not taking: Reported on 04/19/2019) 60 capsule 2   No current facility-administered medications for this visit.     PHYSICAL EXAMINATION: ECOG PERFORMANCE STATUS: 1 - Symptomatic but completely ambulatory  Vitals:   04/19/19 1010  BP: (!) 162/95  Pulse: 81  Resp: 17  Temp: 98.9 F (37.2 C)  SpO2: 99%   Filed Weights   04/19/19 1010  Weight: 221 lb 14.4 oz (100.7 kg)    GENERAL:alert, no distress and comfortable SKIN: skin color, texture, turgor are normal, no rashes or significant lesions EYES: normal, Conjunctiva are pink and non-injected, sclera clear  NECK: supple, thyroid normal size, non-tender, without nodularity LYMPH:  no palpable lymphadenopathy in the cervical,  axillary  LUNGS: clear to auscultation and percussion with normal breathing effort HEART: regular rate & rhythm and no murmurs and no lower extremity edema ABDOMEN:abdomen soft, non-tender and normal bowel sounds (+) Midline surgical incision healed well  Musculoskeletal:no cyanosis of digits and no clubbing  NEURO: alert & oriented x 3 with fluent speech, no focal motor/sensory deficits  LABORATORY DATA:  I have reviewed the data as listed CBC Latest Ref Rng & Units 04/10/2019 01/09/2019 12/08/2018  WBC 4.0 - 10.5 K/uL 5.1 4.2 4.5  Hemoglobin 13.0 - 17.0 g/dL 14.7 13.3 12.9(L)  Hematocrit 39.0 - 52.0 % 45.5 39.5 40.0  Platelets 150 - 400 K/uL 154 183 161     CMP Latest Ref Rng & Units 04/10/2019 01/09/2019 12/08/2018  Glucose 70 - 99 mg/dL 80 97 141(H)  BUN 8 - 23 mg/dL 13 12 15    Creatinine 0.61 - 1.24 mg/dL 1.09 0.95 1.10  Sodium 135 - 145 mmol/L 140 140 140  Potassium 3.5 - 5.1 mmol/L 4.3 3.7 4.3  Chloride 98 - 111 mmol/L 105 105 105  CO2 22 - 32 mmol/L 26 25 26   Calcium 8.9 - 10.3 mg/dL 8.9 9.3 9.1  Total Protein 6.5 - 8.1 g/dL 7.1 7.0 6.8  Total Bilirubin 0.3 - 1.2 mg/dL 0.7 0.7 0.9  Alkaline Phos 38 - 126 U/L 102 91 98  AST 15 - 41 U/L 19 16 14(L)  ALT 0 - 44 U/L 15 10 <6      RADIOGRAPHIC STUDIES: I have personally reviewed the radiological images as listed and agreed with the findings in the report. No results found.   ASSESSMENT & PLAN:  Vincent Mcclure is a 85 y.o. male with   1. Adenocarcinoma of right colon, invasive adenocarcinoma, stage IIIB (pT4a, pN1, cM0), MSS -Diagnosed in 05/2018. Treated with hemicolectomy. He receivedadjuvantXeloda 2000 mg BID one week on, one week off from 07/31/2018 to 01/29/2019, he overall tolerated well  --We discussed his CT CAP from 04/12/19 which shows No evidence of metastatic disease. There is a 76mm right lower lobe pulmonary nodule. Plan to repeat CT chest in 6-12 months.  -He did smoke on and off for 50 years.  -He is doing very well, has recovered well from chemo. Labs reviewed from last week, CBC, CMP and CEA WNL.  -We again reviewed the risk of recurrence, and surveillance plan. He will be due for surveillance colonoscopy in 1-2 months with Dr. Hilarie Fredrickson.  -f/u in 4 months   2. Anemia, due to chronic blood lossand iron deficiency -He is currently on oral iron.Improved after surgery -Anemia currently resolved   3. CAD S/P CABG -f/u with PCP -He has had 2 episode of chest pain in the last few weeks. He did use nitroglycerin to help.  -I will send message to Dr. Meda Coffee, I also encouraged him to see him within a month. He is agreeable.    4. Neuropathy, G1 -He had neuropathy in feet prior to chemo -mild neuropathy of fingertip, slightly worse with Xeloda. Will monitor.  -Currently on  Gabapentin100mg  bid. Will continue   Plan -CT CAP reviewed, NED -He is clinically doing well  -Send message to Dr. Meda Coffee about chest pain  -Send message to Dr. Hilarie Fredrickson about surveillance colonoscopy in August 2020 -Lab and f/u in 4 months     No problem-specific Assessment & Plan notes found for this encounter.   No orders of the defined types were placed in this encounter.  All questions were answered. The patient  knows to call the clinic with any problems, questions or concerns. No barriers to learning was detected. I spent 20 minutes counseling the patient face to face. The total time spent in the appointment was 25 minutes and more than 50% was on counseling and review of test results     Truitt Merle, MD 04/19/2019   I, Joslyn Devon, am acting as scribe for Truitt Merle, MD.   I have reviewed the above documentation for accuracy and completeness, and I agree with the above.

## 2019-04-19 ENCOUNTER — Other Ambulatory Visit: Payer: Self-pay

## 2019-04-19 ENCOUNTER — Inpatient Hospital Stay: Payer: Medicare HMO | Attending: Hematology | Admitting: Hematology

## 2019-04-19 ENCOUNTER — Telehealth: Payer: Self-pay | Admitting: Hematology

## 2019-04-19 ENCOUNTER — Encounter: Payer: Self-pay | Admitting: Hematology

## 2019-04-19 VITALS — BP 162/95 | HR 81 | Temp 98.9°F | Resp 17 | Ht 71.0 in | Wt 221.9 lb

## 2019-04-19 DIAGNOSIS — D509 Iron deficiency anemia, unspecified: Secondary | ICD-10-CM | POA: Insufficient documentation

## 2019-04-19 DIAGNOSIS — Z85038 Personal history of other malignant neoplasm of large intestine: Secondary | ICD-10-CM | POA: Insufficient documentation

## 2019-04-19 DIAGNOSIS — I2510 Atherosclerotic heart disease of native coronary artery without angina pectoris: Secondary | ICD-10-CM | POA: Diagnosis not present

## 2019-04-19 DIAGNOSIS — C182 Malignant neoplasm of ascending colon: Secondary | ICD-10-CM

## 2019-04-19 DIAGNOSIS — G629 Polyneuropathy, unspecified: Secondary | ICD-10-CM | POA: Insufficient documentation

## 2019-04-19 DIAGNOSIS — I10 Essential (primary) hypertension: Secondary | ICD-10-CM

## 2019-04-19 NOTE — Telephone Encounter (Signed)
Scheduled appt per 7/9 los. Spoke with patient wife and she is aware of the appt date and time.

## 2019-04-23 ENCOUNTER — Telehealth: Payer: Self-pay | Admitting: *Deleted

## 2019-04-23 ENCOUNTER — Encounter: Payer: Self-pay | Admitting: *Deleted

## 2019-04-23 DIAGNOSIS — R072 Precordial pain: Secondary | ICD-10-CM

## 2019-04-23 DIAGNOSIS — I2510 Atherosclerotic heart disease of native coronary artery without angina pectoris: Secondary | ICD-10-CM

## 2019-04-23 DIAGNOSIS — Z951 Presence of aortocoronary bypass graft: Secondary | ICD-10-CM

## 2019-04-23 DIAGNOSIS — E782 Mixed hyperlipidemia: Secondary | ICD-10-CM

## 2019-04-23 NOTE — Telephone Encounter (Signed)
-----   Message from Dorothy Spark, MD sent at 04/22/2019 10:17 AM EDT ----- Karlene Einstein, Can you please schedule this patient for a LExiscan nuclear stress test on the D SPECT camera early this week?Thank you, [place] Siren ----- Message ----- From: Truitt Merle, MD Sent: 04/19/2019   8:41 PM EDT To: Dorothy Spark, MD, Jerene Bears, MD  Dr. Meda Coffee,  I saw him today for colon cancer follow up. He is doing well from cancer standpoint, NED. He told me that he has had two episode of chest pain, non-exertional, in the past 2 weeks. This is new since his CABG surgery. He called your office but could not get a appointment with you until next month. Could you see him sooner? Thanks   Dr. Hilarie Fredrickson, he is due for colonoscopy in August, please schedule for him, thanks  Krista Blue

## 2019-04-23 NOTE — Telephone Encounter (Signed)
Left the pt a message to call the office back to endorse to him that Dr Meda Coffee spoke with Dr. [name] about his chest pain, and she wants to order for him to get a lexiscan done on D-SPECT, in our office this week.  Went ahead and placed the lexiscan order and in system and instruction letter has been typed.  Will await for the pt to return a cal back to endorse recommendations.

## 2019-04-24 ENCOUNTER — Ambulatory Visit (INDEPENDENT_AMBULATORY_CARE_PROVIDER_SITE_OTHER): Payer: Medicare HMO | Admitting: Family Medicine

## 2019-04-24 ENCOUNTER — Encounter: Payer: Self-pay | Admitting: Family Medicine

## 2019-04-24 ENCOUNTER — Other Ambulatory Visit: Payer: Self-pay

## 2019-04-24 VITALS — BP 133/83 | HR 83 | Temp 98.6°F | Ht 71.0 in | Wt 219.5 lb

## 2019-04-24 DIAGNOSIS — I25810 Atherosclerosis of coronary artery bypass graft(s) without angina pectoris: Secondary | ICD-10-CM | POA: Diagnosis not present

## 2019-04-24 DIAGNOSIS — E876 Hypokalemia: Secondary | ICD-10-CM | POA: Diagnosis not present

## 2019-04-24 DIAGNOSIS — I10 Essential (primary) hypertension: Secondary | ICD-10-CM

## 2019-04-24 DIAGNOSIS — I214 Non-ST elevation (NSTEMI) myocardial infarction: Secondary | ICD-10-CM | POA: Diagnosis not present

## 2019-04-24 DIAGNOSIS — I739 Peripheral vascular disease, unspecified: Secondary | ICD-10-CM | POA: Diagnosis not present

## 2019-04-24 DIAGNOSIS — K219 Gastro-esophageal reflux disease without esophagitis: Secondary | ICD-10-CM

## 2019-04-24 DIAGNOSIS — I2510 Atherosclerotic heart disease of native coronary artery without angina pectoris: Secondary | ICD-10-CM

## 2019-04-24 DIAGNOSIS — E8809 Other disorders of plasma-protein metabolism, not elsewhere classified: Secondary | ICD-10-CM | POA: Diagnosis not present

## 2019-04-24 DIAGNOSIS — D62 Acute posthemorrhagic anemia: Secondary | ICD-10-CM

## 2019-04-24 DIAGNOSIS — D509 Iron deficiency anemia, unspecified: Secondary | ICD-10-CM | POA: Diagnosis not present

## 2019-04-24 DIAGNOSIS — I209 Angina pectoris, unspecified: Secondary | ICD-10-CM

## 2019-04-24 DIAGNOSIS — R7302 Impaired glucose tolerance (oral): Secondary | ICD-10-CM | POA: Diagnosis not present

## 2019-04-24 DIAGNOSIS — E782 Mixed hyperlipidemia: Secondary | ICD-10-CM | POA: Diagnosis not present

## 2019-04-24 DIAGNOSIS — C182 Malignant neoplasm of ascending colon: Secondary | ICD-10-CM | POA: Diagnosis not present

## 2019-04-24 DIAGNOSIS — Z136 Encounter for screening for cardiovascular disorders: Secondary | ICD-10-CM | POA: Diagnosis not present

## 2019-04-24 DIAGNOSIS — I5032 Chronic diastolic (congestive) heart failure: Secondary | ICD-10-CM

## 2019-04-24 DIAGNOSIS — R531 Weakness: Secondary | ICD-10-CM | POA: Diagnosis not present

## 2019-04-24 MED ORDER — AMITRIPTYLINE HCL 50 MG PO TABS: 50.0000 mg | ORAL_TABLET | Freq: Every day | ORAL | 0 refills | Status: DC

## 2019-04-24 MED ORDER — METOPROLOL SUCCINATE ER 50 MG PO TB24: 50.0000 mg | ORAL_TABLET | Freq: Every day | ORAL | 0 refills | Status: DC

## 2019-04-24 MED ORDER — NITROGLYCERIN 0.4 MG SL SUBL: 0.4000 mg | SUBLINGUAL_TABLET | SUBLINGUAL | 0 refills | Status: DC | PRN

## 2019-04-24 MED ORDER — PRAVASTATIN SODIUM 40 MG PO TABS: 40.0000 mg | ORAL_TABLET | Freq: Every day | ORAL | 1 refills | Status: DC

## 2019-04-24 NOTE — Telephone Encounter (Signed)
Called the pt and spoke with him and endorsed to him that per Dr Meda Coffee, she would like for him to go ahead and get a lexiscan done in our office this week, prior to seeing Ellen Henri PA-C next week on 7/22, for complaints mentioned to Dr. Hilarie Fredrickson of exertional chest pain.  Pt is refusing to proceed with having a stress test done this week, stating "I'm not getting any kind of stress test done this week until I see [place] next week and talk about this face-to-face." Informed the pt that ideally, for his safety and to have testing done before his visit next week, we should go ahead and schedule for him to have his lexiscan done this week. Pt is still refusing to have this done until he see's Ellen Henri PA-C in our office on 7/22.  Pt education provided again. Informed the pt that I respect his wishes and I will let Dr Meda Coffee, Dr. Hilarie Fredrickson, and Dr. [name] know that he does not want to proceed with his lexiscan, until he has his OV at our office next week, and can discuss this with our Provider then.  Order is still placed and instructions are typed up, so when pt reports to our office next week, we can then arrange for his lexiscan to be done thereafter.  Pt was appreciative for the call.

## 2019-04-24 NOTE — Telephone Encounter (Signed)
That's ok.

## 2019-04-24 NOTE — Progress Notes (Signed)
Impression and Recommendations:    1. Essential hypertension   2. Mixed hyperlipidemia   3. Coronary artery disease involving coronary bypass graft of native heart without angina pectoris   4. Glucose intolerance (impaired glucose tolerance)   5. s/p NSTEMI (non-ST elevated myocardial infarction); with subsequent urgent CABG  5/18 ([name])   6. PAD (peripheral artery disease) ([name])   7. Coronary artery disease involving native coronary artery of native heart without angina pectoris   8. Gastroesophageal reflux disease without esophagitis   9. Cancer of ascending colon pT4apN1 s/p right colectomy 05/19/2018   10. Chronic diastolic CHF (congestive heart failure) ([name])   11. Angina pectoris ([name])    -Blood pressure stable refill medicines. -Due to patient's angina symptoms, especially since his heart attack 2018 and known unstable coronary artery disease he needs to follow-up with cardiology for this.  I asked my front desk Vincent Mcclure to please get patient an appointment with someone on Dr. Francesca Mcclure team or a new cardiologist in the near future. -For patient's peripheral arterial disease-symptoms stable.  Encourage patient to walk at least 1 to 2 miles every day.  Slowly work his way up -For his bilateral burning in feet and ankles, past PCP had given him amitriptyline and worked well.  Patient can only occasionally took it at night.  We will refill this medicine. -For patient's colon cancer-she continues to follow-up with his oncologist Dr. Annamaria Mcclure.  Recent CT abdomen pelvis and chest was stable and patient states he follows up closely with them. -Patient stopped his Lasix for his congestive heart failure however symptoms are stable.  He will address this with cardiology in the future, to see if he should resume this medicine or not.  Explained to patient he likely will need more cardiac studies especially since going through colon cancer and right hemicolectomy etc. since last being seen -We will  get a full set of fasting blood work on patient today although he is not fasting so we will get a direct LDL.  We will send these to his cardiologist as well. -Explained to patient he needs to come in every 3 months due to his age and significant multiple medical problems.   - I have not seen him since October 2019 explained to patient this is not acceptable. -Blood pressure stable - we will refill metoprolol until patient follows up with cardiology and then cardiology will resume NTG, metoprolol, cholesterol meds etc due to his significant coronary artery disease with unstable angina, peripheral arterial disease and peripheral vascular disease conditions.  Essential hypertension - Plan: metoprolol succinate (TOPROL-XL) 50 MG 24 hr tablet, Ambulatory referral to Cardiology,  Mixed hyperlipidemia - Plan: pravastatin (PRAVACHOL) 40 MG tablet, Ambulatory referral to Cardiology,  Coronary artery disease involving coronary bypass graft of native heart without angina pectoris - Plan: pravastatin (PRAVACHOL) 40 MG tablet, metoprolol succinate (TOPROL-XL) 50 MG 24 hr tablet, nitroGLYCERIN (NITROSTAT) 0.4 MG SL tablet,  -Treatment medications per cardiology in the future.  Glucose intolerance (impaired glucose tolerance) - Plan: We will obtain A1c today  s/p NSTEMI (non-ST elevated myocardial infarction); with subsequent urgent CABG  5/18 [facility]) - Plan: metoprolol succinate (TOPROL-XL) 50 MG 24 hr tablet, nitroGLYCERIN (NITROSTAT) 0.4 MG SL tablet, Ambulatory referral to Cardiology,   PAD (peripheral artery disease) ([name]) - Plan: amitriptyline (ELAVIL) 50 MG tablet, Ambulatory referral to Cardiology,  Coronary artery disease involving native coronary artery of native heart without angina pectoris - Plan: nitroGLYCERIN (NITROSTAT) 0.4 MG SL  tablet, Ambulatory referral to Cardiology,  Gastroesophageal reflux disease without esophagitis - Plan: sx stable, no complaints  Cancer of ascending colon pT4apN1 s/p  right colectomy 05/19/2018 -follow-up Dr. Burr Mcclure treatment per them  Chronic diastolic CHF (congestive heart failure) ([name]) - Plan: Ambulatory referral to Cardiology,  Angina pectoris ([name]) - Plan: nitroGLYCERIN (NITROSTAT) 0.4 MG SL tablet, Ambulatory referral to Cardiology   Orders Placed This Encounter  Procedures  . Ambulatory referral to Cardiology    Meds ordered this encounter  Medications  . pravastatin (PRAVACHOL) 40 MG tablet    Sig: Take 1 tablet (40 mg total) by mouth at bedtime. Needs office visit for further refills.    Dispense:  90 tablet    Refill:  1  . metoprolol succinate (TOPROL-XL) 50 MG 24 hr tablet    Sig: Take 1 tablet (50 mg total) by mouth daily.    Dispense:  90 tablet    Refill:  0  . amitriptyline (ELAVIL) 50 MG tablet    Sig: Take 1 tablet (50 mg total) by mouth at bedtime. -burn/tingling in feet/ankles    Dispense:  90 tablet    Refill:  0  . nitroGLYCERIN (NITROSTAT) 0.4 MG SL tablet    Sig: Place 1 tablet (0.4 mg total) under the tongue every 5 (five) minutes as needed for chest pain (or tightness).    Dispense:  12 tablet    Refill:  0    Medications Discontinued During This Encounter  Medication Reason  . metoprolol succinate (TOPROL-XL) 50 MG 24 hr tablet Reorder  . pravastatin (PRAVACHOL) 40 MG tablet Reorder  . amitriptyline (ELAVIL) 25 MG tablet Reorder     Gross side effects, risk and benefits, and alternatives of medications and treatment plan in general discussed with patient.  Patient is aware that all medications have potential side effects and we are unable to predict every side effect or drug-drug interaction that may occur.   Patient will call with any questions prior to using medication if they have concerns.    Expresses verbal understanding and consents to current therapy and treatment regimen.  No barriers to understanding were identified.  Red flag symptoms and signs discussed in detail.  Patient expressed understanding  regarding what to do in case of emergency\urgent symptoms  Please see AVS handed out to patient at the end of our visit for further patient instructions/ counseling done pertaining to today's office visit.   Return for 23mo or sooner if problems- BP, chol, ANGINA.     Note:  This note was prepared with assistance of Dragon voice recognition software. Occasional wrong-word or sound-a-like substitutions may have occurred due to the inherent limitations of voice recognition software.  Marjory Sneddon, DO 04/24/2019 8:53 AM    --------------------------------------------------------------------------------------------------------------------------------------------------------------------------------------------------------------------------------------------    Subjective:     HPI: Vincent Mcclure is a 85 y.o. male who presents to [place] at [facility] today for issues as discussed below. --> pt lost to f/up - last seen Oct 2019   Pt recently seen Dr Al Corpus- he will f/up with Vincent Mcclure on Oncology.  Recent ct abd/P and Chest- showed to metastasis but did have stable LLower lobe nodule 84mm- will repeat per oncology   AMI 2018-  Started feeling tightness in chest--> took NTG and it went away.  And 1 week ago- same thing- took NTG and went away in 2 min.  Pt has a difficult  Pt will only come into office time to see doctors--> last  seen by his Cards doc: 08/10/17  PAD- feet tingling at night in bedtime-  ankles and feet burn and feel on fire  When trys to walk- pain in knees and hips-  Due to absence  - needs RF pravastatin--> has not had fasting lipid profile in some time and we will obtain today.  CHF: Pt ran out of his lasix and Cards wouldn't fill- 2-3 mo ago- no inc sx, no sob, orthopnea.  Still sleeping on one pillow per night.  No more dyspnea with exertion than usual.    Wt Readings from Last 3 Encounters:  04/24/19 219 lb 8 oz (99.6 kg)  04/19/19 221 lb  14.4 oz (100.7 kg)  01/09/19 212 lb 8 oz (96.4 kg)   BP Readings from Last 3 Encounters:  04/24/19 133/83  04/19/19 (!) 162/95  01/09/19 (!) 146/89   Pulse Readings from Last 3 Encounters:  04/24/19 83  04/19/19 81  01/09/19 92   BMI Readings from Last 3 Encounters:  04/24/19 30.61 kg/m  04/19/19 30.95 kg/m  01/09/19 29.64 kg/m     Patient Care Team    Relationship Specialty Notifications Start End  [name], DO PCP - General Family Medicine  09/29/16   Pyrtle, Lajuan Lines, MD Consulting Physician Gastroenterology  09/29/16   Allyn Kenner, MD Consulting Physician Dermatology  09/29/16   Dorothy Spark, MD Consulting Physician Cardiology  04/06/17   Levin Erp, Ashley Physician Assistant Gastroenterology  07/07/17   Murrell Converse Physician Assistant Cardiology  07/07/17      Patient Active Problem List   Diagnosis Date Noted  . Chronic diastolic CHF (congestive heart failure) ([place]) 06/02/2018    Priority: High  . Cancer of ascending colon pT4apN1 s/p right colectomy 05/19/2018 05/24/2018    Priority: High  . CAD (coronary artery disease), native coronary artery 03/14/2017    Priority: High  . Paroxysmal atrial fibrillation ([name])     Priority: High  . s/p NSTEMI (non-ST elevated myocardial infarction); with subsequent urgent CABG  5/18 ([place]) 02/12/2017    Priority: High  . PAD (peripheral artery disease) ([place]) 10/14/2011    Priority: High  . Glucose intolerance (impaired glucose tolerance) 10/12/2011    Priority: High  . HLD (hyperlipidemia) 03/05/2008    Priority: High  . HTN (hypertension) 03/05/2008    Priority: High  . Overweight (BMI 25.0-29.9) 09/29/2016    Priority: Medium  . Acute esophagitis     Priority: Medium  . Acute gastric ulcer     Priority: Medium  . Duodenal ulcer disease     Priority: Medium  . Anemia, iron deficiency 03/05/2008    Priority: Medium  . GERD 03/05/2008    Priority: Medium  . IBS 03/05/2008    Priority:  Medium  . COLONIC POLYPS, HX OF 03/05/2008    Priority: Medium  . Alcohol use/ h/o heavy abuse 08/11/2016    Priority: Low  . Blood in stool 03/05/2008    Priority: Low  . r/o Colitis, ischemic ([place]) 07/19/2018  . Recent Abscess of abdominal wall 07/19/2018  . Cellulitis of left abdominal wall 07/05/2018  . Intraabdominal fluid collection 07/05/2018  . Peripheral edema   . Post-operative state   . Rupture of operation wound   . General weakness   . SOB (shortness of breath)   . CHF (congestive heart failure) ([place]) 06/06/2018  . Subacute confusional state 06/05/2018  . Hypoalbuminemia 06/03/2018  . Hypokalemia 06/02/2018  . Atrial fibrillation with RVR ([place]) 06/02/2018  .  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 Chest pain 02/11/2017  . Left shoulder pain 01/24/2015  . h/o Anemia due to blood loss, acute 11/14/2014  . Eye pain   . Upper GI bleed 11/13/2014  . Lumbar disc disease 01/30/2014  . Hearing loss 03/23/2013  . Screening for other and unspecified cardiovascular conditions 03/23/2013  . Chronic low back pain 10/14/2011  . Preventative health care 10/12/2011  . LEG CRAMPS 09/02/2010  . PERIPHERAL EDEMA 09/02/2010  . VARICOSE VEINS, LOWER EXTREMITIES 06/09/2009  . Hereditary and idiopathic peripheral neuropathy 03/05/2008    Past Medical history, Surgical history, Family history, Social history, Allergies and Medications have been entered into the medical record, reviewed and changed as needed.    Current Meds  Medication Sig  . acetaminophen (TYLENOL) 500 MG tablet Take 1 tablet (500 mg total) by mouth every 6 (six) hours as needed for mild pain.  Marland Kitchen amitriptyline (ELAVIL) 50 MG tablet Take 1 tablet (50 mg total) by mouth at bedtime. -burn/tingling in feet/ankles  . aspirin EC 81 MG EC tablet Take 1 tablet (81 mg total) by mouth daily.  . Ferrous Sulfate (IRON) 325 (65 Fe) MG TABS TAKE 1 TABLET BY MOUTH THREE TIMES DAILY WITH MEALS  . metoprolol succinate (TOPROL-XL) 50 MG 24 hr  tablet Take 1 tablet (50 mg total) by mouth daily.  . Multiple Vitamin (MULTIVITAMIN WITH MINERALS) TABS tablet Take 1 tablet by mouth daily.  . pravastatin (PRAVACHOL) 40 MG tablet Take 1 tablet (40 mg total) by mouth at bedtime. Needs office visit for further refills.  . [DISCONTINUED] amitriptyline (ELAVIL) 25 MG tablet Take 50 mg by mouth at bedtime.  . [DISCONTINUED] metoprolol succinate (TOPROL-XL) 50 MG 24 hr tablet Take 1 tablet (50 mg total) by mouth daily. Please make overdue appt for future refills. [phone]. 2nd attempt.  . [DISCONTINUED] pravastatin (PRAVACHOL) 40 MG tablet Take 1 tablet (40 mg total) by mouth at bedtime. Needs office visit for further refills.    Allergies:  Allergies  Allergen Reactions  . No Known Allergies      Review of Systems:  A fourteen system review of systems was performed and found to be positive as per HPI.   Objective:   Blood pressure 133/83, pulse 83, temperature 98.6 F (37 C), height 5\' 11"  (1.803 m), weight 219 lb 8 oz (99.6 kg), SpO2 96 %. Body mass index is 30.61 kg/m. General:  Well Developed, well nourished, appropriate for stated age.  Neuro:  Alert and oriented,  extra-ocular muscles intact  HEENT:  Normocephalic, atraumatic, neck supple, no carotid bruits appreciated  Skin:  no gross rash, warm, pink. Cardiac:  RRR, S1 S2 Respiratory:  ECTA B/L and A/P, Not using accessory muscles, speaking in full sentences- unlabored. Vascular:  Ext warm, no cyanosis apprec.; cap RF less 2 sec. Psych:  No HI/SI, judgement and insight good, Euthymic mood. Full Affect.

## 2019-04-24 NOTE — Patient Instructions (Signed)
Angina  Angina is extreme discomfort in the chest, neck, arm, jaw, or back. The discomfort is caused by a lack of blood in the middle layer of the heart wall (myocardium). There are four types of angina:  Stable angina. This is triggered by vigorous activity or exercise. It goes away when you rest or take angina medicine.  Unstable angina. This is a warning sign and can lead to a heart attack (acute coronary syndrome). This is a medical emergency. Symptoms come at rest and last a long time.  Microvascular angina. This affects the small coronary arteries. Symptoms include feeling tired and being short of breath.  Prinzmetal or variant angina. This is caused by a tightening (spasm) of the arteries that go to your heart. What are the causes? This condition is caused by atherosclerosis. This is the buildup of fat and cholesterol (plaque) in your arteries. The plaque may narrow or block the artery. Other causes of angina include:  Sudden tightening of the muscles of the arteries in the heart (coronary spasm).  Small artery disease (microvascular dysfunction).  Problems with any of your heart valves (heart valve disease).  A tear in an artery in your heart (coronary artery dissection).  Diseases of the heart muscle (cardiomyopathy), or other heart diseases. What increases the risk? You are more likely to develop this condition if you have:  High cholesterol.  High blood pressure (hypertension).  Diabetes.  A family history of heart disease.  An inactive (sedentary) lifestyle, or you do not exercise enough.  Depression.  Had radiation treatment to the left side of your chest. Other risk factors include:  Using tobacco.  Being obese.  Eating a diet high in saturated fats.  Being exposed to high stress or triggers of stress.  Using drugs, such as cocaine. Women have a greater risk for angina if:  They are older than 55.  They have gone through menopause (are  postmenopausal). What are the signs or symptoms? Common symptoms of this condition in both men and women may include:  Chest pain, which may: ? Feel like a crushing or squeezing in the chest, or like a tightness, pressure, fullness, or heaviness in the chest. ? Last for more than a few minutes at a time, or it may stop and come back (recur) over the course of a few minutes.  Pain in the neck, arm, jaw, or back.  Unexplained heartburn or indigestion.  Shortness of breath.  Nausea.  Sudden cold sweats. Women and people with diabetes may have unusual (atypical) symptoms, such as:  Fatigue.  Unexplained feelings of nervousness or anxiety.  Unexplained weakness.  Dizziness or fainting. How is this diagnosed? This condition may be diagnosed based on:  Your symptoms and medical history.  Electrocardiogram (ECG) to measure the electrical activity in your heart.  Blood tests.  Stress test to look for signs of blockage when your heart is stressed.  CT angiogram to examine your heart and the blood flow to it.  Coronary angiogram to check your coronary arteries for blockage. How is this treated? Angina may be treated with:  Medicines to: ? Prevent blood clots and heart attack. ? Relax blood vessels and improve blood flow to the heart (nitrates). ? Reduce blood pressure, improve the pumping action of the heart, and relax blood vessels that are spasming. ? Reduce cholesterol and help treat atherosclerosis.  A procedure to widen a narrowed or blocked coronary artery (angioplasty). A mesh tube may be placed in a coronary artery to  keep it open (coronary stenting).  Surgery to allow blood to go around a blocked artery (coronary artery bypass surgery). Follow these instructions at home: Medicines  Take over-the-counter and prescription medicines only as told by your health care provider.  Do not take the following medicines unless your health care provider approves: ? NSAIDs,  such as ibuprofen or naproxen. ? Vitamin supplements that contain vitamin A, vitamin E, or both. ? Hormone replacement therapy that contains estrogen with or without progestin. Eating and drinking   Eat a heart-healthy diet. This includes plenty of fresh fruits and vegetables, whole grains, low-fat (lean) protein, and low-fat dairy products.  Follow instructions from your health care provider about eating or drinking restrictions. Activity  Follow an exercise program approved by your health care provider.  Consider joining a cardiac rehabilitation program.  Take a break when you feel fatigued. Plan rest periods in your daily activities. Lifestyle   Do not use any products that contain nicotine or tobacco, such as cigarettes, e-cigarettes, and chewing tobacco. If you need help quitting, ask your health care provider.  If your health care provider says you can drink alcohol: ? Limit how much you use to:  0-1 drink a day for nonpregnant women.  0-2 drinks a day for men. ? Be aware of how much alcohol is in your drink. In the [place] one drink equals one 12 oz bottle of beer (355 mL), one 5 oz glass of wine (148 mL), or one 1 oz glass of hard liquor (44 mL). General instructions  Maintain a healthy weight.  Learn to manage stress.  Keep your vaccinations up to date. Get the flu (influenza) vaccine every year.  Talk to your health care provider if you feel depressed. Take a depression screening test to see if you are at risk for depression.  Work with your health care provider to manage other health conditions, such as hypertension or diabetes.  Keep all follow-up visits as told by your health care provider. This is important. Get help right away if:  You have pain in your chest, neck, arm, jaw, or back, and the pain: ? Lasts more than a few minutes. ? Is recurring. ? Is not relieved by taking medicines under the tongue (sublingual nitroglycerin). ? Increases in intensity or  frequency.  You have a lot of sweating without cause.  You have unexplained: ? Heartburn or indigestion. ? Shortness of breath or difficulty breathing. ? Nausea or vomiting. ? Fatigue. ? Feelings of nervousness or anxiety. ? Weakness.  You have sudden light-headedness or dizziness.  You faint. These symptoms may represent a serious problem that is an emergency. Do not wait to see if the symptoms will go away. Get medical help right away. Call your local emergency services (911 in the [place] Do not drive yourself to the hospital. Summary  Angina is extreme discomfort in the chest, neck, arm, jaw, or back that is caused by a lack of blood in the heart wall.  There are many symptoms of angina. They include chest pain, unexplained heartburn or indigestion, sudden cold sweats, and fatigue.  Angina may be treated with behavioral changes, medicine, or surgery.  Symptoms of angina may represent an emergency. Get medical help right away. Call your local emergency services (911 in the [place] Do not drive yourself to the hospital. This information is not intended to replace advice given to you by your health care provider. Make sure you discuss any questions you have with your health care provider.  Document Released: 09/27/2005 Document Revised: 05/15/2018 Document Reviewed: 05/15/2018 Elsevier Patient Education  [place].     Acute Coronary Syndrome Acute coronary syndrome (ACS) is a serious problem in which there is suddenly not enough blood and oxygen reaching the heart. ACS can result in chest pain or a heart attack. This condition is a medical emergency. If you have any symptoms of this condition, get help right away. What are the causes? This condition may be caused by:  A buildup of fat and cholesterol inside the arteries (atherosclerosis). This is the most common cause. The buildup (plaque) can cause blood vessels in the heart (coronary arteries) to become narrow or  blocked, which reduces blood flow to the heart. Plaque can also break off and lead to a clot, which can block an artery and cause a heart attack or stroke.  Sudden tightening of the muscles around the coronary arteries (coronary spasm).  Tearing of a coronary artery (spontaneous coronary artery dissection).  Very low blood pressure (hypotension).  An abnormal heartbeat (arrhythmia).  Other medical conditions that cause a decrease of oxygen to the heart, such as anemiaorrespiratory failure.  Using cocaine or methamphetamine. What increases the risk? The following factors may make you more likely to develop this condition:  Age. The risk for ACS increases as you get older.  History of chest pain, heart attack, peripheral artery disease, or stroke.  Having taken chemotherapy or immune-suppressing medicines.  Being male.  Family history of chest pain, heart disease, or stroke.  Smoking.  Not exercising enough.  Being overweight.  High cholesterol.  High blood pressure (hypertension).  Diabetes.  Excessive alcohol use. What are the signs or symptoms? Common symptoms of this condition include:  Chest pain. The pain may last a long time, or it may stop and come back (recur). It may feel like: ? Crushing or squeezing. ? Tightness, pressure, fullness, or heaviness.  Arm, neck, jaw, or back pain.  Heartburn or indigestion.  Shortness of breath.  Nausea.  Sudden cold sweats.  Light-headedness.  Dizziness or passing out.  Tiredness (fatigue). Sometimes there are no symptoms. How is this diagnosed? This condition may be diagnosed based on:  Your medical history and symptoms.  Imaging tests, such as: ? An electrocardiogram (ECG). This measures the heart's electrical activity. ? X-rays. ? CT scan. ? A coronary angiogram. For this test, dye is injected into the heart arteries and then X-rays are taken. ? Myocardial perfusion imaging. This test shows how well  blood flows through your heart muscle.  Blood tests. These may be repeated at certain time intervals.  Exercise stress testing.  Echocardiogram. This is a test that uses sound waves to produce detailed images of the heart. How is this treated? Treatment for this condition may include:  Oxygen therapy.  Medicines, such as: ? Antiplatelet medicines and blood-thinning medicines, such as aspirin. These help prevent blood clots. ? Medicine that dissolves any blood clots (fibrinolytic therapy). ? Blood pressure medicines. ? Nitroglycerin. This helps widen blood vessels to improve blood flow. ? Pain medicine. ? Cholesterol-lowering medicine.  Surgery, such as: ? Coronary angioplasty with stent placement. This involves placing a small piece of metal that looks like mesh or a spring into a narrow coronary artery. This widens the artery and keeps it open. ? Coronary artery bypass surgery. This involves taking a section of a blood vessel from a different part of your body and placing it on the blocked coronary artery to allow blood to flow  around the blockage.  Cardiac rehabilitation. This is a program that includes exercise training, education, and counseling to help you recover. Follow these instructions at home: Eating and drinking  Eat a heart-healthy diet that includes whole grains, fruits and vegetables, lean proteins, and low-fat or nonfat dairy products.  Limit how much salt (sodium) you eat as told by your health care provider. Follow instructions from your health care provider about any other eating or drinking restrictions, such as limiting foods that are high in fat and processed sugars.  Use healthy cooking methods such as roasting, grilling, broiling, baking, poaching, steaming, or stir-frying.  Work with a dietitian to follow a heart-healthy eating plan. Medicines  Take over-the-counter and prescription medicines only as told by your health care provider.  Do not take these  medicines unless your health care provider approves: ? Vitamin supplements that contain vitamin A or vitamin E. ? NSAIDs, such as ibuprofen, naproxen, or celecoxib. ? Hormone replacement therapy that contains estrogen.  If you are taking blood thinners: ? Talk with your health care provider before you take any medicines that contain aspirin or NSAIDs. These medicines increase your risk for dangerous bleeding. ? Take your medicine exactly as told, at the same time every day. ? Avoid activities that could cause injury or bruising, and follow instructions about how to prevent falls. ? Wear a medical alert bracelet, and carry a card that lists what medicines you take. Activity  Follow your cardiac rehabilitation program. Do exercises as told by your physical therapist.  Ask your health care provider what activities and exercises are safe for you. Follow his or her instructions about lifting, driving, or climbing stairs. Lifestyle  Do not use any products that contain nicotine or tobacco, such as cigarettes, e-cigarettes, and chewing tobacco. If you need help quitting, ask your health care provider.  Do not drink alcohol if your health care provider tells you not to drink.  If you drink alcohol: ? Limit how much you have to 0-1 drink a day. ? Be aware of how much alcohol is in your drink. In the [place] one drink equals one 12 oz bottle of beer (355 mL), one 5 oz glass of wine (148 mL), or one 1 oz glass of hard liquor (44 mL).  Maintain a healthy weight. If you need to lose weight, work with your health care provider to do so safely. General instructions  Tell all the health care providers who provide care for you about your heart condition, including your dentist. This may affect the medicines or treatment you receive.  Manage any other health conditions you have, such as hypertension or diabetes. These conditions affect your heart.  Pay attention to your mental health. You may be at  higher risk for depression. ? Find ways to manage stress. ? Talk to your health care provider about depression screening and treatment.  Keep your vaccinations up to date. ? Get the flu shot (influenza vaccine) every year. ? Get the pneumococcal vaccine if you are age 7 or older.  If directed, monitor your blood pressure at home.  Keep all follow-up visits as told by your health care provider. This is important. Contact a health care provider if you:  Feel overwhelmed or sad.  Have trouble doing your daily activities. Get help right away if you:  Have pain in your chest, neck, arm, jaw, stomach, or back that recurs, and: ? It lasts for more than a few minutes. ? It is not relieved by  taking the [place] health care provider prescribed.  Have unexplained: ? Heavy sweating. ? Heartburn or indigestion. ? Nausea or vomiting. ? Shortness of breath. ? Difficulty breathing. ? Fatigue. ? Nervousness or anxiety. ? Weakness. ? Diarrhea. ? Dark stools or blood in your stool.  Have sudden light-headedness or dizziness.  Have blood pressure that is higher than 180/120.  Faint.  Have thoughts about hurting yourself. These symptoms may represent a serious problem that is an emergency. Do not wait to see if the symptoms will go away. Get medical help right away. Call your local emergency services (911 in the [place] Do not drive yourself to the hospital.  Summary  Acute coronary syndrome (ACS) is when there is not enough blood and oxygen being supplied to the heart. ACS can result in chest pain or a heart attack.  Acute coronary syndrome is a medical emergency. If you have any symptoms of this condition, get help right away.  Treatment includes medicines and procedures to open the blocked arteries and restore blood flow. This information is not intended to replace advice given to you by your health care provider. Make sure you discuss any questions you have with your health care  provider. Document Released: 09/27/2005 Document Revised: 10/09/2018 Document Reviewed: 10/09/2018 Elsevier Patient Education  2020 [employer].

## 2019-04-25 LAB — TSH: TSH: 0.969 u[IU]/mL (ref 0.450–4.500)

## 2019-04-25 LAB — CBC WITH DIFFERENTIAL/PLATELET
Basophils Absolute: 0 10*3/uL (ref 0.0–0.2)
Basos: 0 %
EOS (ABSOLUTE): 0.1 10*3/uL (ref 0.0–0.4)
Eos: 2 %
Hematocrit: 44.8 % (ref 37.5–51.0)
Hemoglobin: 15.2 g/dL (ref 13.0–17.7)
Immature Grans (Abs): 0 10*3/uL (ref 0.0–0.1)
Immature Granulocytes: 0 %
Lymphocytes Absolute: 1.3 10*3/uL (ref 0.7–3.1)
Lymphs: 29 %
MCH: 29.5 pg (ref 26.6–33.0)
MCHC: 33.9 g/dL (ref 31.5–35.7)
MCV: 87 fL (ref 79–97)
Monocytes Absolute: 0.5 10*3/uL (ref 0.1–0.9)
Monocytes: 11 %
Neutrophils Absolute: 2.6 10*3/uL (ref 1.4–7.0)
Neutrophils: 58 %
Platelets: 192 10*3/uL (ref 150–450)
RBC: 5.16 x10E6/uL (ref 4.14–5.80)
RDW: 12.5 % (ref 11.6–15.4)
WBC: 4.5 10*3/uL (ref 3.4–10.8)

## 2019-04-25 LAB — LIPID PANEL
Chol/HDL Ratio: 2.8 ratio (ref 0.0–5.0)
Cholesterol, Total: 133 mg/dL (ref 100–199)
HDL: 47 mg/dL (ref >39)
LDL Calculated: 52 mg/dL (ref 0–99)
Triglycerides: 169 mg/dL — ABNORMAL HIGH (ref 0–149)
VLDL Cholesterol Cal: 34 mg/dL (ref 5–40)

## 2019-04-25 LAB — VITAMIN B12: Vitamin B-12: 280 pg/mL (ref 232–1245)

## 2019-04-25 LAB — COMPREHENSIVE METABOLIC PANEL
ALT: 10 IU/L (ref 0–44)
AST: 14 IU/L (ref 0–40)
Albumin/Globulin Ratio: 2.2 (ref 1.2–2.2)
Albumin: 4.3 g/dL (ref 3.6–4.6)
Alkaline Phosphatase: 99 IU/L (ref 39–117)
BUN/Creatinine Ratio: 11 (ref 10–24)
BUN: 11 mg/dL (ref 8–27)
Bilirubin Total: 0.4 mg/dL (ref 0.0–1.2)
CO2: 22 mmol/L (ref 20–29)
Calcium: 9.4 mg/dL (ref 8.6–10.2)
Chloride: 101 mmol/L (ref 96–106)
Creatinine, Ser: 1.02 mg/dL (ref 0.76–1.27)
GFR calc Af Amer: 77 mL/min/{1.73_m2} (ref >59)
GFR calc non Af Amer: 67 mL/min/{1.73_m2} (ref >59)
Globulin, Total: 2 g/dL (ref 1.5–4.5)
Glucose: 114 mg/dL — ABNORMAL HIGH (ref 65–99)
Potassium: 4.4 mmol/L (ref 3.5–5.2)
Sodium: 138 mmol/L (ref 134–144)
Total Protein: 6.3 g/dL (ref 6.0–8.5)

## 2019-04-25 LAB — LDL CHOLESTEROL, DIRECT: LDL Direct: 64 mg/dL (ref 0–99)

## 2019-04-25 LAB — HEMOGLOBIN A1C
Est. average glucose Bld gHb Est-mCnc: 105 mg/dL
Hgb A1c MFr Bld: 5.3 % (ref 4.8–5.6)

## 2019-04-25 LAB — VITAMIN D 25 HYDROXY (VIT D DEFICIENCY, FRACTURES): Vit D, 25-Hydroxy: 21 ng/mL — ABNORMAL LOW (ref 30.0–100.0)

## 2019-04-25 LAB — T4, FREE: Free T4: 1.29 ng/dL (ref 0.82–1.77)

## 2019-05-01 ENCOUNTER — Telehealth: Payer: Self-pay | Admitting: Cardiology

## 2019-05-01 NOTE — Telephone Encounter (Signed)
New Message ° ° ° °Left message to confirm appt and answer covid questions  °

## 2019-05-02 ENCOUNTER — Other Ambulatory Visit: Payer: Self-pay

## 2019-05-02 ENCOUNTER — Ambulatory Visit (INDEPENDENT_AMBULATORY_CARE_PROVIDER_SITE_OTHER): Payer: Medicare HMO | Admitting: Cardiology

## 2019-05-02 ENCOUNTER — Encounter: Payer: Self-pay | Admitting: Cardiology

## 2019-05-02 VITALS — BP 134/70 | HR 79 | Ht 71.0 in | Wt 220.6 lb

## 2019-05-02 DIAGNOSIS — I480 Paroxysmal atrial fibrillation: Secondary | ICD-10-CM

## 2019-05-02 DIAGNOSIS — R0789 Other chest pain: Secondary | ICD-10-CM

## 2019-05-02 DIAGNOSIS — I2510 Atherosclerotic heart disease of native coronary artery without angina pectoris: Secondary | ICD-10-CM

## 2019-05-02 NOTE — Progress Notes (Signed)
05/02/2019 Vincent Mcclure   1932-08-07  [id number]  Primary Physician [name], DO Primary Cardiologist: Dr. Meda Coffee  Electrophysiologist: None   Reason for Visit/CC: Chest Pain  HPI:  Vincent Mcclure is a 85 y.o. male who is being seen today for the evaluation of chest pain at the request of Dr. [name], Oncology.   Patient is followed by Dr. Meda Coffee and has a history of CAD, hypertension, hyperlipidemia, gastric ulcers with big GI bleed in 2016 secondary to alcohol and previous tobacco abuse.  He also has history of colon cancer s/p hemicolectomy.  He was admitted with a non-STEMI in 2018 and found to have multivessel CAD and preserved LV function on cardiac cath. He underwent CABG x3 on Feb 15, 2017.  He had postoperative atrial fibrillation that was treated with amiodarone and he converted to normal sinus rhythm.  CHA2DS2 VASc score =4 but HAS-BLED score was 5 so no anticoagulation at that time per Dr. Sallyanne Kuster.  Bleed risk exceeds potential stroke reduction benefit.  Patient was recently seen by Dr. [name] for colon cancer follow-up. A staff message was sent to our office requesting an appointment with cardiology.  Per Dr.Feng's message, she reported that the patient was doing well from a cancer standpoint however he had endorsed to her at that visit that he had had an episodes of chest pain. Stress test was recommended.  This was offered to the patient however he declined.  He wanted to further discuss his symptoms at office visit prior to proceeding with the study.  Today in clinic, he reports that he has done well since that time without any recurrent chest pain.  The patient reports that he experienced mild chest discomfort about 3 to 4 weeks ago while outside doing yard work.  He states that this was in the heat and he had been cutting limbs.  He had experienced bilateral upper arm pain radiating down to his elbows and across his chest.  He states it was unlike his angina he had with his  MI in 2018.  He states the pain was very brief and lasted no more than 20 to 30 seconds.  No associated dyspnea, diaphoresis, syncope or near syncope.  He simply felt that he had overworked himself during the hottest part of the day.  He stopped his activity and went inside of his house to cool off and had a cold beer.  Symptoms resolved.  Since that episode 3 to 4 weeks ago, he has not had any recurrent symptoms and he has returned to his usual activities and has returned to yard work without any recurrent chest pain, exertional dyspnea or limitations.  He has not required any use of sublingual nitroglycerin.  His EKG today shows normal sinus rhythm with a rate of 79 bpm no ischemic abnormalities.  Blood pressures well controlled at 134/70.  He has had recent laboratory work.  He had a lipid panel done April 24, 2019.  LDL was at recommended goal of less than 70 and 64 mg/dL.  He reports full compliance with aspirin, metoprolol and pravastatin.  Cardiac Studies   2D echocardiogram 06/03/2018 Study Conclusions  - Left ventricle: The cavity size was normal. Wall thickness was   increased in a pattern of mild LVH. Systolic function was normal.   The estimated ejection fraction was in the range of 55% to 60%.   Wall motion was normal; there were no regional wall motion   abnormalities. Doppler parameters are consistent with abnormal  left ventricular relaxation (grade 1 diastolic dysfunction). - Aortic valve: Mildly calcified annulus. Trileaflet; mildly   thickened leaflets. Valve area (VTI): 4.6 cm^2. Valve area   (Vmax): 4.22 cm^2. - Right atrium: There is a 1.2 by 1.6 cm echodense mass in the   right atrium, overall poorly visualized but best seen in the   apical 4 chamber views. The subcostal views are limited . When   reviewing the 02/15/17 TEE this area is also poorly visualized, but   does appear to be due to a prominent eustachian valve, a benign   finding. - Pulmonary arteries: Systolic  pressure was mildly increased. PA   peak pressure: 37 mm Hg (S). - Systemic veins: IVC is small suggesting low RA pressure and   hypovolemia.   Current Meds  Medication Sig  . acetaminophen (TYLENOL) 500 MG tablet Take 1 tablet (500 mg total) by mouth every 6 (six) hours as needed for mild pain.  Marland Kitchen amitriptyline (ELAVIL) 50 MG tablet Take 1 tablet (50 mg total) by mouth at bedtime. -burn/tingling in feet/ankles  . aspirin EC 81 MG EC tablet Take 1 tablet (81 mg total) by mouth daily.  . Ferrous Sulfate (IRON) 325 (65 Fe) MG TABS TAKE 1 TABLET BY MOUTH THREE TIMES DAILY WITH MEALS  . metoprolol succinate (TOPROL-XL) 50 MG 24 hr tablet Take 1 tablet (50 mg total) by mouth daily.  . Multiple Vitamin (MULTIVITAMIN WITH MINERALS) TABS tablet Take 1 tablet by mouth daily.  . nitroGLYCERIN (NITROSTAT) 0.4 MG SL tablet Place 1 tablet (0.4 mg total) under the tongue every 5 (five) minutes as needed for chest pain (or tightness).  . pravastatin (PRAVACHOL) 40 MG tablet Take 1 tablet (40 mg total) by mouth at bedtime. Needs office visit for further refills.   Allergies  Allergen Reactions  . No Known Allergies    Past Medical History:  Diagnosis Date  . Alcohol abuse   . Anemia   . Chronic low back pain 10/14/2011  . Colon polyps    hyperplastic (2004, 2010) and adenomatous (1990).    . COLONIC POLYPS, HX OF 03/05/2008  . Esophageal stricture    hx of  . Gastric ulcer   . GERD 03/05/2008  . GERD (gastroesophageal reflux disease) 1994   associated peptic strictures  . HYPERLIPIDEMIA 03/05/2008  . HYPERTENSION 03/05/2008  . Hypertension   . IBS 03/05/2008  . Impaired glucose tolerance 10/12/2011  . Iron deficiency anemia   . LEG CRAMPS 09/02/2010  . PAD (peripheral artery disease) ([place]) 10/14/2011  . PAD (peripheral artery disease) ([place]) 2013  . PERIPHERAL EDEMA 09/02/2010  . PERIPHERAL NEUROPATHY 03/05/2008  . Prostatitis    hx of  . VARICOSE VEINS, LOWER EXTREMITIES 06/09/2009   Family  History  Problem Relation Age of Onset  . Cancer Mother        Brain Cancer  . Diabetes Father   . Heart disease Father        CAD  . Hypertension Father   . Stroke Father   . Diabetes Sister   . Stomach cancer Neg Hx   . Pancreatic cancer Neg Hx   . Colon cancer Neg Hx   . Esophageal cancer Neg Hx    Past Surgical History:  Procedure Laterality Date  . CATARACT EXTRACTION  bilat  . COLECTOMY    . CORONARY ARTERY BYPASS GRAFT N/A 02/15/2017   Procedure: CORONARY ARTERY BYPASS GRAFTING times three  with left internal mammary harvest and endoscopic harvest of Right  SVG. Grafts of LIMA to  LAD, SVG to Distal Circ, and to First Diag.;  Surgeon: Grace Isaac, MD;  Location: [facility];  Service: Open Heart Surgery;  Laterality: N/A;  . ESOPHAGOGASTRODUODENOSCOPY N/A 11/14/2014   Procedure: ESOPHAGOGASTRODUODENOSCOPY (EGD);  Surgeon: Jerene Bears, MD;  Location: [facility] ENDOSCOPY;  Service: Endoscopy;  Laterality: N/A;  . IR RADIOLOGIST EVAL & MGMT  07/04/2018  . IR RADIOLOGIST EVAL & MGMT  07/18/2018  . LAPAROTOMY N/A 05/19/2018   Procedure: EXPLORATORY LAPAROTOMY RIGHT COLECTOMY;  Surgeon: Fanny Skates, MD;  Location: [facility];  Service: General;  Laterality: N/A;  . LEFT HEART CATH AND CORONARY ANGIOGRAPHY N/A 02/14/2017   Procedure: Left Heart Cath and Coronary Angiography;  Surgeon: Belva Crome, MD;  Location: [facility];  Service: Cardiovascular;  Laterality: N/A;  . LUMBAR SPINE SURGERY  11/2008   Dr Joya Salm  . TEE WITHOUT CARDIOVERSION N/A 02/15/2017   Procedure: TRANSESOPHAGEAL ECHOCARDIOGRAM (TEE);  Surgeon: Grace Isaac, MD;  Location: [facility];  Service: Open Heart Surgery;  Laterality: N/A;   Social History   Socioeconomic History  . Marital status: Married    Spouse name: Not on file  . Number of children: 4  . Years of education: Not on file  . Highest education level: Not on file  Occupational History  . Occupation: Retired Biomedical scientist  . Financial  resource strain: Not on file  . Food insecurity    Worry: Not on file    Inability: Not on file  . Transportation needs    Medical: Not on file    Non-medical: Not on file  Tobacco Use  . Smoking status: Former Smoker    Packs/day: 1.25    Years: 50.00    Pack years: 62.50    Types: Cigarettes    Quit date: 11/11/2008    Years since quitting: 10.4  . Smokeless tobacco: Former Systems developer    Types: Chew  Substance and Sexual Activity  . Alcohol use: Yes    Alcohol/week: 6.0 standard drinks    Types: 2 Glasses of wine, 4 Cans of beer per week  . Drug use: No  . Sexual activity: Never  Lifestyle  . Physical activity    Days per week: Not on file    Minutes per session: Not on file  . Stress: Not on file  Relationships  . Social Herbalist on phone: Not on file    Gets together: Not on file    Attends religious service: Not on file    Active member of club or organization: Not on file    Attends meetings of clubs or organizations: Not on file    Relationship status: Not on file  . Intimate partner violence    Fear of current or ex partner: Not on file    Emotionally abused: Not on file    Physically abused: Not on file    Forced sexual activity: Not on file  Other Topics Concern  . Not on file  Social History Narrative   ** Merged History Encounter **       4 sons     Lipid Panel     Component Value Date/Time   CHOL 133 04/24/2019 0917   TRIG 169 (H) 04/24/2019 0917   HDL 47 04/24/2019 0917   CHOLHDL 2.8 04/24/2019 0917   CHOLHDL 3.4 02/12/2017 0657   VLDL 17 02/12/2017 0657   LDLCALC 52 04/24/2019 5427  LDLDIRECT 64 04/24/2019 0917   LDLDIRECT 133.2 10/14/2011 1106    Review of Systems: General: negative for chills, fever, night sweats or weight changes.  Cardiovascular: negative for chest pain, dyspnea on exertion, edema, orthopnea, palpitations, paroxysmal nocturnal dyspnea or shortness of breath Dermatological: negative for rash Respiratory:  negative for cough or wheezing Urologic: negative for hematuria Abdominal: negative for nausea, vomiting, diarrhea, bright red blood per rectum, melena, or hematemesis Neurologic: negative for visual changes, syncope, or dizziness All other systems reviewed and are otherwise negative except as noted above.   Physical Exam:  Blood pressure 134/70, pulse 79, height 5\' 11"  (1.803 m), weight 220 lb 9.6 oz (100.1 kg), SpO2 99 %.  General appearance: alert, cooperative and no distress Neck: no carotid bruit and no JVD Lungs: clear to auscultation bilaterally Heart: regular rate and rhythm, S1, S2 normal, no murmur, click, rub or gallop Extremities: extremities normal, atraumatic, no cyanosis or edema Pulses: 2+ and symmetric Skin: Skin color, texture, turgor normal. No rashes or lesions Neurologic: Grossly normal  EKG normal sinus rhythm, 79 bpm.  No ischemic abnormalities.-- personally reviewed   ASSESSMENT AND PLAN:   1.  Chest pain in the setting of known CAD: Status post non-STEMI in 2018 with cardiac catheterization showing multivessel coronary artery disease treated with CABG x3.  LV function was normal at that time.  He did have an episode of chest discomfort 3 to 4 weeks weeks ago while performing yard work during the hottest part of the day.  Chest pain was brief, lasting 20 to 30 seconds without any associated dyspnea, diaphoresis, syncope/near syncope and was not consistent with his previous angina that he had associated with his myocardial infarction.  His chest pain resolved quickly and spontaneously without intervention.  He did not require use of sublingual nitroglycerin. Since that episode 3 to 4 weeks ago, he has not had any recurrent symptoms and he has returned to his usual activities and has returned to yard work without any recurrent chest pain, exertional dyspnea or limitations.  He has not required any use of sublingual nitroglycerin.  His EKG today shows normal sinus rhythm  with a rate of 79 bpm and no ischemic abnormalities.  Blood pressure is well controlled at 134/70.  He has had recent laboratory work.  He had a lipid panel done April 24, 2019.  LDL was at recommended goal of less than 70 and 64 mg/dL.  He reports full compliance with aspirin, metoprolol and pravastatin.  Given the atypical nature of his symptoms, lack of recurrence and normal EKG we will hold off on plans for nuclear stress test at this time.  We will continue medical therapy as he is currently taking.  Patient was educated on worrisome cardiac symptoms and was instructed to contact our office if he has any recurrent chest pain associated with physical activity or any new exertional dyspnea.  We will plan to follow-up with Dr. Meda Coffee again in 6 months for repeat evaluation, or sooner if recurrent chest pain.    Follow-Up 6 months w/ Dr. Meda Coffee.   Brittainy Ladoris Gene, MHS [facility] 05/02/2019 9:15 AM

## 2019-05-02 NOTE — Patient Instructions (Signed)
Medication Instructions:  NONE If you need a refill on your cardiac medications before your next appointment, please call your pharmacy.   Lab work: NONE If you have labs (blood work) drawn today and your tests are completely normal, you will receive your results only by: Marland Kitchen MyChart Message (if you have MyChart) OR . A paper copy in the mail If you have any lab test that is abnormal or we need to change your treatment, we will call you to review the results.  Testing/Procedures: NONE  Follow-Up: At [facility], you and your health needs are our priority.  As part of our continuing mission to provide you with exceptional heart care, we have created designated Provider Care Teams.  These Care Teams include your primary Cardiologist (physician) and Advanced Practice Providers (APPs -  Physician Assistants and Nurse Practitioners) who all work together to provide you with the care you need, when you need it. You will need a follow up appointment in 6 months.  Please call our office 2 months in advance to schedule this appointment.  You may see Dr Meda Coffee or one of the following Advanced Practice Providers on your designated Care Team:   [place], PA-C Melina Copa, PA-C . Ermalinda Barrios, PA-C  Any Other Special Instructions Will Be Listed Below (If Applicable). Call us if symptoms resume  [phone]

## 2019-05-04 ENCOUNTER — Telehealth: Payer: Self-pay | Admitting: *Deleted

## 2019-05-04 NOTE — Telephone Encounter (Signed)
Reviewed cardiology note, they are not planning intervention or change in therapy Dr. [name] recommended colonoscopy for surveillance which is due at this time If patient agreeable, okay to proceed with surveillance colonoscopy in setting of history of ascending colon cancer

## 2019-05-04 NOTE — Telephone Encounter (Signed)
-----   Message from Larina Bras, [place] sent at 04/27/2019 10:14 AM EDT -----  ----- Message ----- From: Larina Bras, CMA Sent: 04/27/2019 To: Larina Bras, CMA   ----- Message ----- From: Jerene Bears, MD Sent: 04/23/2019  11:49 AM EDT To: Larina Bras, CMA  FYI Colonoscopy for surveillance will need to wait until after the stress test recommended by Dr. Meda Coffee Once done, we can discuss colonoscopy as requested by Dr. Cherly Anderson ----- Message ----- From: Kathyleen Radice Spark, MD Sent: 04/22/2019  10:17 AM EDT To: Nuala Alpha, LPN, Jerene Bears, MD, #  Karlene Einstein, Can you please schedule this patient for a LExiscan nuclear stress test on the D SPECT camera early this week?Thank you, [place] Siren ----- Message ----- From: Truitt Merle, MD Sent: 04/19/2019   8:41 PM EDT To: Amel Kitch Spark, MD, Jerene Bears, MD  Dr. Meda Coffee,  I saw him today for colon cancer follow up. He is doing well from cancer standpoint, NED. He told me that he has had two episode of chest pain, non-exertional, in the past 2 weeks. This is new since his CABG surgery. He called your office but could not get a appointment with you until next month. Could you see him sooner? Thanks   Dr. Hilarie Fredrickson, he is due for colonoscopy in August, please schedule for him, thanks  Krista Blue

## 2019-05-04 NOTE — Telephone Encounter (Signed)
Dr Hilarie Fredrickson, please see cardiology telephone note from 04/23/19 and cardiology office note from 05/02/19. At this time do you want me to schedule patient colonoscopy?

## 2019-05-04 NOTE — Telephone Encounter (Signed)
Left message for patient to call back  

## 2019-05-07 NOTE — Telephone Encounter (Signed)
Left message with male for patient to call back.

## 2019-05-08 NOTE — Telephone Encounter (Signed)
Patient indicates that he is agreeable to scheduling colonoscopy. I have advised that as soon as a schedule becomes available, I will get him scheduled for the procedure.

## 2019-05-10 NOTE — Telephone Encounter (Signed)
I have called back to set patient up for colonoscopy since we now have a schedule available. Male says she will have patient call back.

## 2019-05-10 NOTE — Telephone Encounter (Signed)
I have spoken to patient and have scheduled colonoscopy 06/11/19 at 930 am with an 830 am arrival. In addition, he has been scheduled for previsit 05/24/19 at 8am. Patient indicates that he is NOT on o2 nor is he on any anticoagulation.

## 2019-05-24 ENCOUNTER — Ambulatory Visit (AMBULATORY_SURGERY_CENTER): Payer: Self-pay | Admitting: *Deleted

## 2019-05-24 ENCOUNTER — Other Ambulatory Visit: Payer: Self-pay

## 2019-05-24 VITALS — Temp 96.9°F | Ht 71.0 in | Wt 222.0 lb

## 2019-05-24 DIAGNOSIS — Z85038 Personal history of other malignant neoplasm of large intestine: Secondary | ICD-10-CM

## 2019-05-24 MED ORDER — SUPREP BOWEL PREP KIT 17.5-3.13-1.6 GM/177ML PO SOLN: ORAL | 0 refills | Status: DC

## 2019-05-24 NOTE — Progress Notes (Signed)
Pt is aware that care partner will wait in the car during procedure; if they feel like they will be too hot to wait in the car; they may wait in the lobby.  We want them to wear a mask (we do not have any that we can provide them), practice social distancing, and we will check their temperatures when they get here.  I did remind patient that their care partner needs to stay in the parking lot the entire time. Pt will wear mask into building.  No egg or soy allergy  No home oxygen use or problems with anesthesia  No medications for weight loss taken  emmi information given

## 2019-06-05 ENCOUNTER — Encounter: Payer: Self-pay | Admitting: Internal Medicine

## 2019-06-08 ENCOUNTER — Telehealth: Payer: Self-pay | Admitting: Internal Medicine

## 2019-06-08 NOTE — Telephone Encounter (Signed)
Spoke with patient regarding Covid-19 screening questions  Do you now or have you had a fever in the last 14 days?    no    Do you have any respiratory symptoms of shortness of breath or cough now or in the last 14 days?    no   Do you have any family members or close contacts with diagnosed or suspected Covid-19 in the past 14 days?     no   Have you been tested for Covid-19 and found to be positive?    no   Please  make patient aware that care partner may wait in the car or come up to the lobby during the procedure but will need to provide their own mask.

## 2019-06-11 ENCOUNTER — Encounter: Payer: Self-pay | Admitting: Internal Medicine

## 2019-06-11 ENCOUNTER — Ambulatory Visit (AMBULATORY_SURGERY_CENTER): Payer: Medicare HMO | Admitting: Internal Medicine

## 2019-06-11 ENCOUNTER — Other Ambulatory Visit: Payer: Self-pay

## 2019-06-11 VITALS — BP 156/91 | HR 72 | Temp 97.4°F | Resp 18 | Ht 71.0 in | Wt 222.0 lb

## 2019-06-11 DIAGNOSIS — Z85038 Personal history of other malignant neoplasm of large intestine: Secondary | ICD-10-CM

## 2019-06-11 DIAGNOSIS — K635 Polyp of colon: Secondary | ICD-10-CM

## 2019-06-11 DIAGNOSIS — I10 Essential (primary) hypertension: Secondary | ICD-10-CM | POA: Diagnosis not present

## 2019-06-11 DIAGNOSIS — D123 Benign neoplasm of transverse colon: Secondary | ICD-10-CM

## 2019-06-11 DIAGNOSIS — D125 Benign neoplasm of sigmoid colon: Secondary | ICD-10-CM

## 2019-06-11 DIAGNOSIS — E785 Hyperlipidemia, unspecified: Secondary | ICD-10-CM | POA: Diagnosis not present

## 2019-06-11 DIAGNOSIS — I2510 Atherosclerotic heart disease of native coronary artery without angina pectoris: Secondary | ICD-10-CM | POA: Diagnosis not present

## 2019-06-11 MED ORDER — SODIUM CHLORIDE 0.9 % IV SOLN: 500.0000 mL | Freq: Once | INTRAVENOUS | Status: DC

## 2019-06-11 NOTE — Progress Notes (Signed)
Pt. Reports no change in her medical or surgical history since his pre-visit 05/24/2019.

## 2019-06-11 NOTE — Op Note (Signed)
[facility] Patient Name: Vincent Mcclure Procedure Date: 06/11/2019 9:43 AM MRN: [id number] Endoscopist: Jerene Bears , MD Age: 85 Referring MD:  Date of Birth: 1932-03-25 Gender: Male Account #: [id number] Procedure:                Colonoscopy Indications:              High risk colon cancer surveillance: Personal                            history of colon cancer of the ascending colon in                            2019, s/p right hemicolectomy, last colonoscopy 2010 Medicines:                Monitored Anesthesia Care Procedure:                Pre-Anesthesia Assessment:                           - Prior to the procedure, a History and Physical                            was performed, and patient medications and                            allergies were reviewed. The patient's tolerance of                            previous anesthesia was also reviewed. The risks                            and benefits of the procedure and the sedation                            options and risks were discussed with the patient.                            All questions were answered, and informed consent                            was obtained. Prior Anticoagulants: The patient has                            taken no previous anticoagulant or antiplatelet                            agents. ASA Grade Assessment: III - A patient with                            severe systemic disease. After reviewing the risks                            and benefits, the patient was deemed in  satisfactory condition to undergo the procedure.                           After obtaining informed consent, the colonoscope                            was passed under direct vision. Throughout the                            procedure, the patient's blood pressure, pulse, and                            oxygen saturations were monitored continuously. The                            Colonoscope  was introduced through the anus and                            advanced to the cecum, identified by appendiceal                            orifice and ileocecal valve. The colonoscopy was                            performed without difficulty. The patient tolerated                            the procedure well. The quality of the bowel                            preparation was good. The ileocecal valve,                            appendiceal orifice, and rectum were photographed. Scope In: 9:57:31 AM Scope Out: 10:12:50 AM Scope Withdrawal Time: 0 hours 11 minutes 1 second  Total Procedure Duration: 0 hours 15 minutes 19 seconds  Findings:                 The digital rectal exam was normal.                           There was evidence of a prior end-to-side                            colo-colonic anastomosis in the ascending colon.                            This was patent and was characterized by healthy                            appearing mucosa.                           A 8 mm polyp was found in the transverse colon. The  polyp was sessile. The polyp was removed with a                            cold snare. Resection and retrieval were complete.                           Two sessile polyps were found in the sigmoid colon.                            The polyps were 3 to 5 mm in size. These polyps                            were removed with a cold snare. Resection and                            retrieval were complete.                           Multiple small and large-mouthed diverticula were                            found in the sigmoid colon and descending colon.                           The retroflexed view of the distal rectum and anal                            verge was normal and showed no anal or rectal                            abnormalities. Complications:            No immediate complications. Estimated Blood Loss:     Estimated blood loss  was minimal. Impression:               - Patent end-to-side colo-colonic anastomosis,                            characterized by healthy appearing mucosa.                           - One 8 mm polyp in the transverse colon, removed                            with a cold snare. Resected and retrieved.                           - Two 3 to 5 mm polyps in the sigmoid colon,                            removed with a cold snare. Resected and retrieved.                           - Diverticulosis in the sigmoid  colon and in the                            descending colon.                           - The distal rectum and anal verge are normal on                            retroflexion view. Recommendation:           - Patient has a contact number available for                            emergencies. The signs and symptoms of potential                            delayed complications were discussed with the                            patient. Return to normal activities tomorrow.                            Written discharge instructions were provided to the                            patient.                           - Resume previous diet.                           - Continue present medications.                           - Await pathology results.                           - Repeat colonoscopy would be recommended for                            surveillance in 3 years. The colonoscopy date will                            be determined after pathology results from today's                            exam become available for review and the decision                            to repeat colonoscopy will be based on overall                            health at that time with input from oncology. Jerene Bears, MD 06/11/2019 10:17:43 AM This report has been signed electronically.

## 2019-06-11 NOTE — Progress Notes (Signed)
Report to PACU, RN, vss, BBS= Clear.  

## 2019-06-11 NOTE — Progress Notes (Signed)
No problems noted in the recovery room. maw 

## 2019-06-11 NOTE — Patient Instructions (Signed)
YOU HAD AN ENDOSCOPIC PROCEDURE TODAY AT [facility]:   Refer to the procedure report that was given to you for any specific questions about what was found during the examination.  If the procedure report does not answer your questions, please call your gastroenterologist to clarify.  If you requested that your care partner not be given the details of your procedure findings, then the procedure report has been included in a sealed envelope for you to review at your convenience later.  YOU SHOULD EXPECT: Some feelings of bloating in the abdomen. Passage of more gas than usual.  Walking can help get rid of the air that was put into your GI tract during the procedure and reduce the bloating. If you had a lower endoscopy (such as a colonoscopy or flexible sigmoidoscopy) you may notice spotting of blood in your stool or on the toilet paper. If you underwent a bowel prep for your procedure, you may not have a normal bowel movement for a few days.  Please Note:  You might notice some irritation and congestion in your nose or some drainage.  This is from the oxygen used during your procedure.  There is no need for concern and it should clear up in a day or so.  SYMPTOMS TO REPORT IMMEDIATELY:   Following lower endoscopy (colonoscopy or flexible sigmoidoscopy):  Excessive amounts of blood in the stool  Significant tenderness or worsening of abdominal pains  Swelling of the abdomen that is new, acute  Fever of 100F or higher   For urgent or emergent issues, a gastroenterologist can be reached at any hour by calling [phone].   DIET:  We do recommend a small meal at first, but then you may proceed to your regular diet.  Drink plenty of fluids but you should avoid alcoholic beverages for 24 hours.  ACTIVITY:  You should plan to take it easy for the rest of today and you should NOT DRIVE or use heavy machinery until tomorrow (because of the sedation medicines used during the test).     FOLLOW UP: Our staff will call the number listed on your records 48-72 hours following your procedure to check on you and address any questions or concerns that you may have regarding the information given to you following your procedure. If we do not reach you, we will leave a message.  We will attempt to reach you two times.  During this call, we will ask if you have developed any symptoms of COVID 19. If you develop any symptoms (ie: fever, flu-like symptoms, shortness of breath, cough etc.) before then, please call [phone].  If you test positive for Covid 19 in the 2 weeks post procedure, please call and report this information to [place].    If any biopsies were taken you will be contacted by phone or by letter within the next 1-3 weeks.  Please call us at [phone] if you have not heard about the biopsies in 3 weeks.    SIGNATURES/CONFIDENTIALITY: You and/or your care partner have signed paperwork which will be entered into your electronic medical record.  These signatures attest to the fact that that the information above on your After Visit Summary has been reviewed and is understood.  Full responsibility of the confidentiality of this discharge information lies with you and/or your care-partner.    Handouts were given to you on polyps and diverticulosis. You may resume your current medications today. Await biopsy results. Please call if any questions  or concerns.

## 2019-06-13 ENCOUNTER — Telehealth: Payer: Self-pay

## 2019-06-13 NOTE — Telephone Encounter (Signed)
No answer, left message to call if having any issues or concerns, B.Veretta Sabourin RN 

## 2019-06-13 NOTE — Telephone Encounter (Signed)
No answer, left message to call back later today, B.Iszabella Hebenstreit RN. 

## 2019-06-21 ENCOUNTER — Encounter: Payer: Self-pay | Admitting: Internal Medicine

## 2019-07-25 ENCOUNTER — Ambulatory Visit (INDEPENDENT_AMBULATORY_CARE_PROVIDER_SITE_OTHER): Payer: Medicare HMO | Admitting: Family Medicine

## 2019-07-25 ENCOUNTER — Encounter: Payer: Self-pay | Admitting: Family Medicine

## 2019-07-25 ENCOUNTER — Other Ambulatory Visit: Payer: Self-pay

## 2019-07-25 VITALS — BP 131/77 | HR 86 | Temp 97.9°F | Resp 16 | Ht 71.0 in | Wt 232.7 lb

## 2019-07-25 DIAGNOSIS — I10 Essential (primary) hypertension: Secondary | ICD-10-CM | POA: Diagnosis not present

## 2019-07-25 DIAGNOSIS — C182 Malignant neoplasm of ascending colon: Secondary | ICD-10-CM | POA: Diagnosis not present

## 2019-07-25 DIAGNOSIS — E559 Vitamin D deficiency, unspecified: Secondary | ICD-10-CM

## 2019-07-25 DIAGNOSIS — I214 Non-ST elevation (NSTEMI) myocardial infarction: Secondary | ICD-10-CM

## 2019-07-25 DIAGNOSIS — R7302 Impaired glucose tolerance (oral): Secondary | ICD-10-CM | POA: Diagnosis not present

## 2019-07-25 DIAGNOSIS — E782 Mixed hyperlipidemia: Secondary | ICD-10-CM

## 2019-07-25 DIAGNOSIS — I5032 Chronic diastolic (congestive) heart failure: Secondary | ICD-10-CM | POA: Diagnosis not present

## 2019-07-25 DIAGNOSIS — I25810 Atherosclerosis of coronary artery bypass graft(s) without angina pectoris: Secondary | ICD-10-CM | POA: Diagnosis not present

## 2019-07-25 DIAGNOSIS — I209 Angina pectoris, unspecified: Secondary | ICD-10-CM | POA: Diagnosis not present

## 2019-07-25 DIAGNOSIS — I2510 Atherosclerotic heart disease of native coronary artery without angina pectoris: Secondary | ICD-10-CM

## 2019-07-25 DIAGNOSIS — I739 Peripheral vascular disease, unspecified: Secondary | ICD-10-CM

## 2019-07-25 DIAGNOSIS — I208 Other forms of angina pectoris: Secondary | ICD-10-CM | POA: Diagnosis not present

## 2019-07-25 MED ORDER — METOPROLOL SUCCINATE ER 50 MG PO TB24: 50.0000 mg | ORAL_TABLET | Freq: Every day | ORAL | 0 refills | Status: DC

## 2019-07-25 MED ORDER — PRAVASTATIN SODIUM 40 MG PO TABS: 40.0000 mg | ORAL_TABLET | Freq: Every day | ORAL | 1 refills | Status: DC

## 2019-07-25 MED ORDER — NITROGLYCERIN 0.4 MG SL SUBL: 0.4000 mg | SUBLINGUAL_TABLET | SUBLINGUAL | 0 refills | Status: DC | PRN

## 2019-07-25 NOTE — Patient Instructions (Addendum)
Your cardiology team will resume  refilling your nitroglycerin, metoprolol, cholesterol med due to your significant coronary artery disease with unstable angina, peripheral arterial disease etc   How to Increase Your Level of Physical Activity  Getting regular physical activity is important for your overall health and well-being. Most people do not get enough exercise. There are easy ways to increase your level of physical activity, even if you have not been very active in the past or you are just starting out. Why is physical activity important? Physical activity has many short-term and long-term health benefits. Regular exercise can:  Help you lose weight or maintain a healthy weight.  Strengthen your muscles and bones.  Boost your mood and improve self-esteem.  Reduce your risk of certain long-term (chronic) diseases, like heart disease, cancer, and diabetes.  Help you stay capable of walking and moving around (mobile) as you age.  Prevent accidents, such as falls, as you age.  Increase life expectancy.  What are the benefits of being physically active on a regular basis? In addition to improving your physical health, being physically active on most days of the week can help you in ways that you may not expect. Benefits of regular physical activity may include:  Feeling good about your body.  Being able to move around more easily and for longer periods of time without getting tired (increased stamina).  Finding new sources of fun and enjoyment.  Meeting new people who share a common interest.  Being able to fight off illness better (enhanced immunity).  Being able to sleep better.  What can happen if I am not physically active on a regular basis? Not getting enough physical activity can lead to an unhealthy lifestyle and future health problems. This can increase your chances of:  Becoming overweight or obese.  Becoming sick.  Developing chronic illnesses, like heart  disease or diabetes.  Having mental health problems, like depression or anxiety.  Having sleep problems.  Having trouble walking or getting yourself around (reduced mobility).  Injuring yourself in a fall as you get older.  What steps can I take to be more physically active?  Check with your health care provider about how to get started. Ask your health care provider what activities are safe for you.  Start out slowly. Walking or doing some simple chair exercises is a good place to start, especially if you have not been active before or for a long time.  Try to find activities that you enjoy. You are more likely to commit to an exercise routine if it does not feel like a chore.  If you have bone or joint problems, choose low-impact exercises, like walking or swimming.  Include physical activity in your everyday routine.  Invite friends or family members to exercise with you. This also will help you commit to your workout plan.  Set goals that you can work toward.  Aim for at least 150 minutes of moderate-intensity exercise each week. Examples of moderate-intensity exercise include walking or riding a bike. Where to find more information:  Centers for Disease Control and Prevention: BowlingGrip.is  President's Council on [employer], Sports & Nutrition www.http://villegas.org/  ChooseMyPlate: WirelessM[employer]gages.dk Contact a health care provider if:  You have headaches, muscle aches, or joint pain.  You feel dizzy or light-headed while exercising.  You faint.  You have chest pain while exercising. Summary  Exercise benefits your mind and body at any age, even if you are just starting out.  If you have a  chronic illness or have not been active for a while, check with your health care provider before increasing your physical activity.  Choose activities that are safe and enjoyable for you.Ask your health care provider what  activities are safe for you.  Start slowly. Tell your health care provider if you have problems as you start to increase your activity level. This information is not intended to replace advice given to you by your health care provider. Make sure you discuss any questions you have with your health care provider. Document Released: 09/16/2016 Document Revised: 09/16/2016 Document Reviewed: 09/16/2016 Elsevier Interactive Patient Education  [employer].

## 2019-07-25 NOTE — Progress Notes (Signed)
Impression and Recommendations:    1. Coronary artery disease involving coronary bypass graft of native heart without angina pectoris   2. s/p NSTEMI (non-ST elevated myocardial infarction); with subsequent urgent CABG  5/18 ([place])   3. Coronary artery disease involving native coronary artery of native heart without angina pectoris   4. Angina pectoris ([place])   5. Essential hypertension   6. Mixed hyperlipidemia   7. Vitamin D deficiency   8. Chronic diastolic CHF (congestive heart failure) ([place])   9. Glucose intolerance (impaired glucose tolerance)     CAD Involving Coronary Artery, Coronary Bypass, StableAngina - s/p NSTEMI, CABG - CAD and angina stable at this time on current management. - Good control on nitroglycerin when needed.   - Due to extensive cardiac history, advised patient to f/up with cardiology per their recs. - Reviewed that Cardiovascular medications should be managed by cardiology. - Discussed that these medications will say "refill per cardiology in future." - Advised patient to tell cardiology directly "I only want to see the doctor" if that is his preference. - Per pt, has an appointment scheduled in near future.  - Strongly encouraged patient to engage in cardiac rehab and that he should discuss this with Cards doc since he would like "to get stronger".  - Advised patient of prudent use of nitroglycerin . - Discussed that if he ever needs a third nitroglycerin w/o relief, to dial 911 for emergent care. - Patient understands when he needs to seek emergency care.  - Will continue to monitor.   Essential Hypertension - BP currently at goal. - Continue treatment plan as established by cards.  - Counseled patient on pathophysiology of disease and discussed various treatment options, which always includes dietary and lifestyle modification as first line.   - Lifestyle changes such as dash and heart healthy diets and engaging in a regular exercise  program discussed extensively with patient.   - Ambulatory blood pressure monitoring encouraged at least 3 times weekly.  Keep log and bring in every office visit.  Reminded patient that if they ever feel poorly in any way, to check their blood pressure and pulse.  - We will continue to monitor   Mixed Hyperlipidemia - Continue treatment plan as established. Direct LDL 04/2019 was less than 70 and at goal  - Prudent dietary changes such as low saturated & trans fat diets for hyperlipidemia and low carb diets for hypertriglyceridemia discussed with patient.    - Encouraged patient to follow AHA guidelines for regular exercise and also engage in weight loss since obese.   - We will continue to monitor   Vitamin D Deficiency - Continue treatment plan as established. - in July 2020 was too low at 21; told pt to make sure he takes supp as he was not prior  - Will continue to monitor.   BMI Counseling - Body mass index is 32.46 kg/m Explained to patient what BMI refers to, and what it means medically.    Told patient to think about it as a "medical risk stratification measurement" and how increasing BMI is associated with increasing risk/ or worsening state of various diseases such as hypertension, hyperlipidemia, diabetes, premature OA, depression etc.  American Heart Association guidelines for healthy diet, basically Mediterranean diet, and exercise guidelines of 30 minutes 5 days per week or more discussed in detail.  Health counseling performed.  All questions answered.  - If he desires to lose weight, encouraged patient to cut down  on the amount of food he eats, and try to cut back on calorie-rich foods.   Lifestyle & Preventative Health Maintenance - Advised patient to continue working toward exercising to improve overall mental, physical, and emotional health.    - Discussed that the only way to improve joint pains is to remain active.  - Reviewed the "spokes of the wheel" of  mood and health management.  Stressed the importance of ongoing prudent habits, including regular exercise, appropriate sleep hygiene, healthful dietary habits, and prayer/meditation to relax.  - Encouraged patient to engage in daily physical activity as tolerated, especially a formal exercise routine.  Recommended that the patient eventually strive for at least 150 minutes of moderate cardiovascular activity per week according to guidelines established by the [facility].   - Healthy dietary habits encouraged, including low-carb, and high amounts of lean protein in diet.   - Patient should also consume adequate amounts of water.  Recommendations - Return in 3-4 months.   Meds ordered this encounter  Medications  . nitroGLYCERIN (NITROSTAT) 0.4 MG SL tablet    Sig: Place 1 tablet (0.4 mg total) under the tongue every 5 (five) minutes as needed for chest pain (or tightness). Refill per cardiology in future    Dispense:  12 tablet    Refill:  0  . metoprolol succinate (TOPROL-XL) 50 MG 24 hr tablet    Sig: Take 1 tablet (50 mg total) by mouth daily. Refill by cards in future    Dispense:  90 tablet    Refill:  0  . pravastatin (PRAVACHOL) 40 MG tablet    Sig: Take 1 tablet (40 mg total) by mouth at bedtime. Refill by cardiology in future    Dispense:  90 tablet    Refill:  1    Medications Discontinued During This Encounter  Medication Reason  . pravastatin (PRAVACHOL) 40 MG tablet   . metoprolol succinate (TOPROL-XL) 50 MG 24 hr tablet   . nitroGLYCERIN (NITROSTAT) 0.4 MG SL tablet      Gross side effects, risk and benefits, and alternatives of medications and treatment plan in general discussed with patient.  Patient is aware that all medications have potential side effects and we are unable to predict every side effect or drug-drug interaction that may occur.   Patient will call with any questions prior to using medication if they have concerns.    Expresses verbal understanding and  consents to current therapy and treatment regimen.  No barriers to understanding were identified.  Red flag symptoms and signs discussed in detail.  Patient expressed understanding regarding what to do in case of emergency\urgent symptoms  Please see AVS handed out to patient at the end of our visit for further patient instructions/ counseling done pertaining to today's office visit.   Return for vit D, FLP prior to next OV- in 36mo f/up .     Note:  This note was prepared with assistance of Dragon voice recognition software. Occasional wrong-word or sound-a-like substitutions may have occurred due to the inherent limitations of voice recognition software.   This document serves as a record of services personally performed by [name], DO. It was created on her behalf by Toni Amend, a trained medical scribe. The creation of this record is based on the scribe's personal observations and the provider's statements to them.   I have reviewed the above medical documentation for accuracy and completeness and I concur.  [name], DO 07/28/2019 2:13 PM        --------------------------------------------------------------------------------------------------------------------------------------------------------------------------------------------------------------------------------------------  Subjective:     HPI: Vincent Mcclure is a 85 y.o. male who presents to [place] at [facility] today for issues as discussed below.  Says he's doing pretty good.  Notes trying to keep his weight down, not overeating.  Trying to eat less calories, but notes he is having some difficulty.  States he likes to eat toast with butter and jelly.   - Cardiology f/up Says following up with cardiology is "a joke."  "My understanding was that I would go see Dr. Meda Coffee, but the PA is all I saw; I've only seen Dr. Meda Coffee one time in this whole mess."  Says he does not  prefer to see PA's.  States that every time he calls for an appointment, he is told that Dr. Meda Coffee is not there.   - Known CAD-  Med mgt of STABLE ANGINA Says he's exercising a little bit, but he has spells where he aches.  Notes "if I move around a little bit, it feels a little better, but on three different occasions I've taken one of those little glycerin pills; one time this week, 3-4 days ago."  Says his chest tightness got "really bad once."  Notes "I took one, and another five minutes later, and it worked" to relieve the pain/chest pressure.  When the pain came on, confirms he was being active.  Notes "I don't do any hard work, I mow the yard and do trimming, bush edges."  Says "I'm usually pretty active."   - [place] "I think I was still taking that gabapentin for cancer treatment."  Went and had another scan at [place]; "I can't remember what the recap was, but they said they couldn't find anything.  'Small pebbles in my right kidney.'"  Was told he "graduated" from someone's care, and "not that doctor's responsibility anymore."   - Vitamin D Supplementation States he takes two Vitamin D pills every morning.  Notes he has a whole lot of medications and he tries to take them appropriately    Wt Readings from Last 3 Encounters:  07/25/19 232 lb 11.2 oz (105.6 kg)  06/11/19 222 lb (100.7 kg)  05/24/19 222 lb (100.7 kg)   BP Readings from Last 3 Encounters:  07/25/19 131/77  06/11/19 (!) 156/91  05/02/19 134/70   Pulse Readings from Last 3 Encounters:  07/25/19 86  06/11/19 72  05/02/19 79   BMI Readings from Last 3 Encounters:  07/25/19 32.46 kg/m  06/11/19 30.96 kg/m  05/24/19 30.96 kg/m     Patient Care Team    Relationship Specialty Notifications Start End  [name], DO PCP - General Family Medicine  09/29/16   Pyrtle, Lajuan Lines, MD Consulting Physician Gastroenterology  09/29/16   Allyn Kenner, MD Consulting Physician  Dermatology  09/29/16   Dorothy Spark, MD Consulting Physician Cardiology  04/06/17   Levin Erp, [place] Physician Assistant Gastroenterology  07/07/17   Murrell Converse Physician Assistant Cardiology  07/07/17      Patient Active Problem List   Diagnosis Date Noted  . Chronic diastolic CHF (congestive heart failure) ([place]) 06/02/2018    Priority: High  . Cancer of ascending colon [id number] s/p right colectomy 05/19/2018 05/24/2018    Priority: High  . CAD (coronary artery disease), native coronary artery 03/14/2017    Priority: High  . Paroxysmal atrial fibrillation ([name])     Priority: High  . s/p NSTEMI (non-ST elevated myocardial infarction); with subsequent  urgent CABG  5/18 [facility]) 02/12/2017    Priority: High  . PAD (peripheral artery disease) ([place]) 10/14/2011    Priority: High  . Glucose intolerance (impaired glucose tolerance) 10/12/2011    Priority: High  . HLD (hyperlipidemia) 03/05/2008    Priority: High  . HTN (hypertension) 03/05/2008    Priority: High  . Overweight (BMI 25.0-29.9) 09/29/2016    Priority: Medium  . Acute esophagitis     Priority: Medium  . Acute gastric ulcer     Priority: Medium  . Duodenal ulcer disease     Priority: Medium  . Anemia, iron deficiency 03/05/2008    Priority: Medium  . GERD 03/05/2008    Priority: Medium  . IBS 03/05/2008    Priority: Medium  . COLONIC POLYPS, HX OF 03/05/2008    Priority: Medium  . Alcohol use/ h/o heavy abuse 08/11/2016    Priority: Low  . Blood in stool 03/05/2008    Priority: Low  . r/o Colitis, ischemic ([place]) 07/19/2018  . Recent Abscess of abdominal wall 07/19/2018  . Cellulitis of left abdominal wall 07/05/2018  . Intraabdominal fluid collection 07/05/2018  . Peripheral edema   . Post-operative state   . Rupture of operation wound   . General weakness   . SOB (shortness of breath)   . CHF (congestive heart failure) ([place]) 06/06/2018  . Subacute confusional state 06/05/2018  .  Hypoalbuminemia 06/03/2018  . Hypokalemia 06/02/2018  . Atrial fibrillation with RVR ([place]) 06/02/2018  . 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 Chest pain 02/11/2017  . Left shoulder pain 01/24/2015  . h/o Anemia due to blood loss, acute 11/14/2014  . Eye pain   . Upper GI bleed 11/13/2014  . Lumbar disc disease 01/30/2014  . Hearing loss 03/23/2013  . Screening for other and unspecified cardiovascular conditions 03/23/2013  . Chronic low back pain 10/14/2011  . Preventative health care 10/12/2011  . LEG CRAMPS 09/02/2010  . PERIPHERAL EDEMA 09/02/2010  . VARICOSE VEINS, LOWER EXTREMITIES 06/09/2009  . Hereditary and idiopathic peripheral neuropathy 03/05/2008    Past Medical history, Surgical history, Family history, Social history, Allergies and Medications have been entered into the medical record, reviewed and changed as needed.    Current Meds  Medication Sig  . acetaminophen (TYLENOL) 500 MG tablet Take 1 tablet (500 mg total) by mouth every 6 (six) hours as needed for mild pain.  Marland Kitchen amitriptyline (ELAVIL) 50 MG tablet Take 1 tablet (50 mg total) by mouth at bedtime. -burn/tingling in feet/ankles  . aspirin EC 81 MG EC tablet Take 1 tablet (81 mg total) by mouth daily.  . Cholecalciferol (VITAMIN D-3 PO) Take 2,000 Units by mouth daily.  . Ferrous Sulfate (IRON) 325 (65 Fe) MG TABS TAKE 1 TABLET BY MOUTH THREE TIMES DAILY WITH MEALS  . metoprolol succinate (TOPROL-XL) 50 MG 24 hr tablet Take 1 tablet (50 mg total) by mouth daily. Refill by cards in future  . Multiple Vitamin (MULTIVITAMIN WITH MINERALS) TABS tablet Take 1 tablet by mouth daily.  . nitroGLYCERIN (NITROSTAT) 0.4 MG SL tablet Place 1 tablet (0.4 mg total) under the tongue every 5 (five) minutes as needed for chest pain (or tightness). Refill per cardiology in future  . pravastatin (PRAVACHOL) 40 MG tablet Take 1 tablet (40 mg total) by mouth at bedtime. Refill by cardiology in future  . [DISCONTINUED] metoprolol succinate  (TOPROL-XL) 50 MG 24 hr tablet Take 1 tablet (50 mg total) by mouth daily.  . [DISCONTINUED] nitroGLYCERIN (NITROSTAT)  0.4 MG SL tablet Place 1 tablet (0.4 mg total) under the tongue every 5 (five) minutes as needed for chest pain (or tightness).  . [DISCONTINUED] pravastatin (PRAVACHOL) 40 MG tablet Take 1 tablet (40 mg total) by mouth at bedtime. Needs office visit for further refills.    Allergies:  Allergies  Allergen Reactions  . No Known Allergies      Review of Systems:  A fourteen system review of systems was performed and found to be positive as per HPI.   Objective:   Blood pressure 131/77, pulse 86, temperature 97.9 F (36.6 C), resp. rate 16, height 5\' 11"  (1.803 m), weight 232 lb 11.2 oz (105.6 kg), SpO2 97 %. Body mass index is 32.46 kg/m. General:  Well Developed, well nourished, appropriate for stated age.  Neuro:  Alert and oriented,  extra-ocular muscles intact  HEENT:  Normocephalic, atraumatic, neck supple, no carotid bruits appreciated  Skin:  no gross rash, warm, pink. Cardiac:  S1 S2, +M Respiratory:  ECTA B/L and A/P, Not using accessory muscles, speaking in full sentences- unlabored. Vascular:  Ext warm, no cyanosis apprec.; cap RF less 2 sec. Psych:  No HI/SI, judgement and insight good, Euthymic mood. Full Affect.

## 2019-08-16 ENCOUNTER — Other Ambulatory Visit: Payer: Self-pay

## 2019-08-16 ENCOUNTER — Ambulatory Visit (INDEPENDENT_AMBULATORY_CARE_PROVIDER_SITE_OTHER): Payer: Medicare HMO | Admitting: Family Medicine

## 2019-08-16 ENCOUNTER — Encounter: Payer: Self-pay | Admitting: Family Medicine

## 2019-08-16 VITALS — BP 156/98 | HR 86 | Temp 98.4°F | Resp 14 | Ht 71.0 in | Wt 232.1 lb

## 2019-08-16 DIAGNOSIS — M7918 Myalgia, other site: Secondary | ICD-10-CM | POA: Diagnosis not present

## 2019-08-16 NOTE — Progress Notes (Signed)
Pt here for an acute care OV today  Impression and Recommendations:    1. Musculoskeletal pain      Musculoskeletal pain   Musculoskeletal Pain - Discussed patient's symptoms extensively during appointment. - Reviewed musculoskeletal pain vs internal abdominal pain with patient today. - Education provided and all questions answered.  - Discussed that with a normal abdominal exam and no bony tenderness, patient may treat his pain as one would treat other myalgias.  - Advised use of OTC lidocaine patch over the skin where the area of pain is located. - Patient may also use heating pad for relief.  - Discussed that musculoskeletal pain should begin to improve within two weeks.  - If pain continues past 3-4 weeks, or patient develops nausea, vomiting, diarrhea, the pain changes or worsens, fever or chills develop, patient knows to call in to clinic and seek further evaluation PRN.  - Will continue to monitor.  - Novel Covid -19 counseling done; all questions were answered.   - Current CDC / federal and [place] guidelines reviewed with patient  - Reminded pt of extreme importance of social distancing; wearing a mask when out in public; insensate handwashing and cleaning of surfaces, avoiding unnecessary trips for shopping and avoiding ALL but emergency appts etc. - Told patient to be prepared, not scared; and be smart for the sake of others - Patient will call with any additional concerns   Education and routine counseling performed. Handouts provided  Gross side effects, risk and benefits, and alternatives of medications and treatment plan in general discussed with patient.  Patient is aware that all medications have potential side effects and we are unable to predict every side effect or drug-drug interaction that may occur.   Patient will call with any questions prior to using medication if they have concerns.    Expresses verbal understanding and consents to current  therapy and treatment regimen.  No barriers to understanding were identified.  Red flag symptoms and signs discussed in detail.  Patient expressed understanding regarding what to do in case of emergency\urgent symptoms   Please see AVS handed out to patient at the end of our visit for further patient instructions/ counseling done pertaining to today's office visit.   Return for F-up of current med issues as previously d/c pt.     Note:  This document was prepared occasionally using Dragon voice recognition software and may include unintentional dictation errors in addition to a scribe.  This document serves as a record of services personally performed by [name], DO. It was created on her behalf by Toni Amend, a trained medical scribe. The creation of this record is based on the scribe's personal observations and the provider's statements to them.   This case required medical decision making of at least moderate complexity. The above documentation has been reviewed to be accurate and was completed by Marjory Sneddon, D.O.      --------------------------------------------------------------------------------------------------------------------------------------------------------------------------------------------------------------------------------------------    Subjective:    CC:  Chief Complaint  Patient presents with  . Flank Pain    x 3 weeks   I, Toni Amend, am serving as scribe for Dr. [name].  HPI: Vincent Mcclure is a 85 y.o. male who presents to [place] [facility] today for issues as discussed below.  Started a couple of weeks ago.  Feels a constant pain in the right lower back/lateral aspect of lower abdomen.  Notes worse when he twists his  trunk.  Better when he does not move.  States "it's been the same place."  When asked if he was lifting and twisting prior to onset, he states "I was wondering if that was what  happened."  Says "I was trying to think if it was possibly a muscle."  Says he felt the pain was "too consistent" last week and this is why he came in for evaluation today.  Denies nausea or vomiting.  He denies any chest pains, shortness of breath, bowel changes.  No exercise intolerance or difficulties lying flat.  No new respiratory or GI symptoms.  He denies any fever chills or urinary symptoms.  Says the sensation is "not a real constant hard pain," and adds "If I get up and start walking (like getting the paper in the morning)," the pain eases off once he starts moving a while.  Says "the pain is still there" while he sits and reads.  "I sit around a lot and write."  But states that movement improves the pain.  Knows his blood pressure has been high in the past when he's been in pain.  Says they took his appendix out with other abdominal surgeries   Wt Readings from Last 3 Encounters:  08/16/19 232 lb 1.6 oz (105.3 kg)  07/25/19 232 lb 11.2 oz (105.6 kg)  06/11/19 222 lb (100.7 kg)   BP Readings from Last 3 Encounters:  08/16/19 (!) 156/98  07/25/19 131/77  06/11/19 (!) 156/91   BMI Readings from Last 3 Encounters:  08/16/19 32.37 kg/m  07/25/19 32.46 kg/m  06/11/19 30.96 kg/m     Patient Care Team    Relationship Specialty Notifications Start End  [name], DO PCP - General Family Medicine  09/29/16   Pyrtle, Lajuan Lines, MD Consulting Physician Gastroenterology  09/29/16   Allyn Kenner, MD Consulting Physician Dermatology  09/29/16   Dorothy Spark, MD Consulting Physician Cardiology  04/06/17   Levin Erp, [place] Physician Assistant Gastroenterology  07/07/17   Murrell Converse Physician Assistant Cardiology  07/07/17      Patient Active Problem List   Diagnosis Date Noted  . Chronic diastolic CHF (congestive heart failure) ([place]) 06/02/2018    Priority: High  . Cancer of ascending colon [id number] s/p right colectomy 05/19/2018 05/24/2018     Priority: High  . CAD (coronary artery disease), native coronary artery 03/14/2017    Priority: High  . Paroxysmal atrial fibrillation ([name])     Priority: High  . s/p NSTEMI (non-ST elevated myocardial infarction); with subsequent urgent CABG  5/18 ([place]) 02/12/2017    Priority: High  . PAD (peripheral artery disease) ([place]) 10/14/2011    Priority: High  . Glucose intolerance (impaired glucose tolerance) 10/12/2011    Priority: High  . HLD (hyperlipidemia) 03/05/2008    Priority: High  . HTN (hypertension) 03/05/2008    Priority: High  . Morbid obesity ([place]) 09/29/2016    Priority: Medium  . Acute esophagitis     Priority: Medium  . Acute gastric ulcer     Priority: Medium  . Duodenal ulcer disease     Priority: Medium  . Anemia, iron deficiency 03/05/2008    Priority: Medium  . GERD 03/05/2008    Priority: Medium  . IBS 03/05/2008    Priority: Medium  . COLONIC POLYPS, HX OF 03/05/2008    Priority: Medium  . Alcohol use/ h/o heavy abuse 08/11/2016    Priority: Low  . Blood in stool 03/05/2008  Priority: Low  . r/o Colitis, ischemic ([place]) 07/19/2018  . Recent Abscess of abdominal wall 07/19/2018  . Cellulitis of left abdominal wall 07/05/2018  . Intraabdominal fluid collection 07/05/2018  . Peripheral edema   . Post-operative state   . Rupture of operation wound   . General weakness   . SOB (shortness of breath)   . CHF (congestive heart failure) ([place]) 06/06/2018  . Subacute confusional state 06/05/2018  . Hypoalbuminemia 06/03/2018  . Hypokalemia 06/02/2018  . Atrial fibrillation with RVR ([place]) 06/02/2018  . 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 Chest pain 02/11/2017  . Left shoulder pain 01/24/2015  . h/o Anemia due to blood loss, acute 11/14/2014  . Eye pain   . Upper GI bleed 11/13/2014  . Lumbar disc disease 01/30/2014  . Hearing loss 03/23/2013  . Screening for other and unspecified cardiovascular conditions 03/23/2013  . Chronic low back pain 10/14/2011  .  Preventative health care 10/12/2011  . LEG CRAMPS 09/02/2010  . PERIPHERAL EDEMA 09/02/2010  . VARICOSE VEINS, LOWER EXTREMITIES 06/09/2009  . Hereditary and idiopathic peripheral neuropathy 03/05/2008      Past Medical History:  Diagnosis Date  . Alcohol abuse   . Anemia   . Arthritis    hands  . Blood transfusion without reported diagnosis   . Cancer [facility])    colon cancer  . Chronic low back pain 10/14/2011  . Colon polyps    hyperplastic (2004, 2010) and adenomatous (1990).    . COLONIC POLYPS, HX OF 03/05/2008  . Esophageal stricture    hx of  . Gastric ulcer   . GERD 03/05/2008  . GERD (gastroesophageal reflux disease) 1994   associated peptic strictures  . HYPERLIPIDEMIA 03/05/2008  . HYPERTENSION 03/05/2008  . Hypertension   . IBS 03/05/2008  . Impaired glucose tolerance 10/12/2011  . Iron deficiency anemia   . LEG CRAMPS 09/02/2010  . Myocardial infarction [facility])    May 2018  . PAD (peripheral artery disease) ([place]) 10/14/2011  . PAD (peripheral artery disease) ([place]) 2013  . PERIPHERAL EDEMA 09/02/2010  . PERIPHERAL NEUROPATHY 03/05/2008  . Prostatitis    hx of  . VARICOSE VEINS, LOWER EXTREMITIES 06/09/2009     Past Surgical History:  Procedure Laterality Date  . CATARACT EXTRACTION  bilat  . COLECTOMY    . COLONOSCOPY    . CORONARY ARTERY BYPASS GRAFT N/A 02/15/2017   Procedure: CORONARY ARTERY BYPASS GRAFTING times three  with left internal mammary harvest and endoscopic harvest of Right SVG. Grafts of LIMA to  LAD, SVG to Distal Circ, and to First Diag.;  Surgeon: Grace Isaac, MD;  Location: [place];  Service: Open Heart Surgery;  Laterality: N/A;  . ESOPHAGOGASTRODUODENOSCOPY N/A 11/14/2014   Procedure: ESOPHAGOGASTRODUODENOSCOPY (EGD);  Surgeon: Jerene Bears, MD;  Location: [facility] ENDOSCOPY;  Service: Endoscopy;  Laterality: N/A;  . IR RADIOLOGIST EVAL & MGMT  07/04/2018  . IR RADIOLOGIST EVAL & MGMT  07/18/2018  . LAPAROTOMY N/A 05/19/2018   Procedure: EXPLORATORY  LAPAROTOMY RIGHT COLECTOMY;  Surgeon: Fanny Skates, MD;  Location: [facility] ORS;  Service: General;  Laterality: N/A;  . LEFT HEART CATH AND CORONARY ANGIOGRAPHY N/A 02/14/2017   Procedure: Left Heart Cath and Coronary Angiography;  Surgeon: Belva Crome, MD;  Location: [place] CV LAB;  Service: Cardiovascular;  Laterality: N/A;  . LUMBAR SPINE SURGERY  11/2008   Dr Joya Salm  . TEE WITHOUT CARDIOVERSION N/A 02/15/2017   Procedure: TRANSESOPHAGEAL ECHOCARDIOGRAM (TEE);  Surgeon: Servando Snare,  Lilia Argue, MD;  Location: [place];  Service: Open Heart Surgery;  Laterality: N/A;  . UPPER GASTROINTESTINAL ENDOSCOPY       Family History  Problem Relation Age of Onset  . Cancer Mother        Brain Cancer  . Diabetes Father   . Heart disease Father        CAD  . Hypertension Father   . Stroke Father   . Diabetes Sister   . Stomach cancer Neg Hx   . Pancreatic cancer Neg Hx   . Colon cancer Neg Hx   . Esophageal cancer Neg Hx   . Rectal cancer Neg Hx      Social History   Socioeconomic History  . Marital status: Married    Spouse name: Not on file  . Number of children: 4  . Years of education: Not on file  . Highest education level: Not on file  Occupational History  . Occupation: Retired Biomedical scientist  . Financial resource strain: Not on file  . Food insecurity    Worry: Not on file    Inability: Not on file  . Transportation needs    Medical: Not on file    Non-medical: Not on file  Tobacco Use  . Smoking status: Former Smoker    Packs/day: 1.25    Years: 50.00    Pack years: 62.50    Types: Cigarettes    Quit date: 11/11/2008    Years since quitting: 10.7  . Smokeless tobacco: Former Systems developer    Types: Chew  Substance and Sexual Activity  . Alcohol use: Yes    Alcohol/week: 6.0 standard drinks    Types: 2 Glasses of wine, 4 Cans of beer per week    Comment: vodka  . Drug use: No  . Sexual activity: Never  Lifestyle  . Physical activity    Days per week: Not on  file    Minutes per session: Not on file  . Stress: Not on file  Relationships  . Social Herbalist on phone: Not on file    Gets together: Not on file    Attends religious service: Not on file    Active member of club or organization: Not on file    Attends meetings of clubs or organizations: Not on file    Relationship status: Not on file  . Intimate partner violence    Fear of current or ex partner: Not on file    Emotionally abused: Not on file    Physically abused: Not on file    Forced sexual activity: Not on file  Other Topics Concern  . Not on file  Social History Narrative   ** Merged History Encounter **       4 sons     No outpatient medications have been marked as taking for the 08/16/19 encounter (Office Visit) with [name], DO.    Allergies:  Allergies  Allergen Reactions  . No Known Allergies      Review of Systems: General:   Denies fever, chills, unexplained weight loss.  Optho/Auditory:   Denies visual changes, blurred vision/LOV Respiratory:   Denies wheeze, DOE more than baseline levels.   Cardiovascular:   Denies chest pain, palpitations, new onset peripheral edema  Gastrointestinal:   Denies nausea, vomiting, diarrhea, abd pain.  Genitourinary: Denies dysuria, freq/ urgency, flank pain or discharge from genitals.  Endocrine:     Denies hot or cold intolerance, polyuria,  polydipsia. Musculoskeletal:   Denies unexplained myalgias, joint swelling, unexplained arthralgias, gait problems.  Skin:  Denies new onset rash, suspicious lesions Neurological:     Denies dizziness, unexplained weakness, numbness  Psychiatric/Behavioral:   Denies mood changes, suicidal or homicidal ideations, hallucinations    Objective:   Blood pressure (!) 156/98, pulse 86, temperature 98.4 F (36.9 C), temperature source Oral, resp. rate 14, height 5\' 11"  (1.803 m), weight 232 lb 1.6 oz (105.3 kg), SpO2 97 %. Body mass index is 32.37 kg/m. General:   Well Developed, well nourished, appropriate for stated age.  Neuro:  Alert and oriented,  extra-ocular muscles intact  HEENT:  Normocephalic, atraumatic, neck supple Skin:  no gross rash, warm, pink. Cardiac:  RRR, S1 S2 Respiratory:  ECTA B/L and A/P, Not using accessory muscles, speaking in full sentences- unlabored. Vascular:  Ext warm, no cyanosis apprec.; cap RF less 2 sec. Psych:  No HI/SI, judgement and insight good, Euthymic mood. Full Affect. Abdominal:  Soft, obese, non-tender, bowel sounds active all four quadrants, NO G/R/R, no masses, no organomegaly.  No right lower ribcage tenderness anteriorly or posteriorly.

## 2019-08-16 NOTE — Patient Instructions (Addendum)
- Please use over the counter lidocaine patch over the skin where your area of pain is located.  - You may also apply heating pad to the area of pain for 15-20 min 3-4 times per day.  - Your symptoms should improve in a couple of weeks. - If the pain changes, worsens, or does not improve, or you develop fevers, chills, nausea, vomiting, diarrhea, call into clinic at once.    Muscle Cramps and Spasms Muscle cramps and spasms occur when a muscle or muscles tighten and you have no control over this tightening (involuntary muscle contraction). They are a common problem and can develop in any muscle. The most common place is in the calf muscles of the leg. Muscle cramps and muscle spasms are both involuntary muscle contractions, but there are some differences between the two:  Muscle cramps are painful. They come and go and may last for a few seconds or up to 15 minutes. Muscle cramps are often more forceful and last longer than muscle spasms.  Muscle spasms may or may not be painful. They may also last just a few seconds or much longer. Certain medical conditions, such as diabetes or Parkinson's disease, can make it more likely to develop cramps or spasms. However, cramps or spasms are usually not caused by a serious underlying problem. Common causes include:  Doing more physical work or exercise than your body is ready for (overexertion).  Overuse from repeating certain movements too many times.  Remaining in a certain position for a long period of time.  Improper preparation, form, or technique while playing a sport or doing an activity.  Dehydration.  Injury.  Side effects of some medicines.  Abnormally low levels of the salts and minerals in your blood (electrolytes), especially potassium and calcium. This could happen if you are taking water pills (diuretics) or if you are pregnant. In many cases, the cause of muscle cramps or spasms is not known. Follow these instructions at  home: Managing pain and stiffness      Try massaging, stretching, and relaxing the affected muscle. Do this for several minutes at a time.  If directed, apply heat to tight or tense muscles as often as told by your health care provider. Use the heat source that your health care provider recommends, such as a moist heat pack or a heating pad. ? Place a towel between your skin and the heat source. ? Leave the heat on for 20-30 minutes. ? Remove the heat if your skin turns bright red. This is especially important if you are unable to feel pain, heat, or cold. You may have a greater risk of getting burned.  If directed, put ice on the affected area. This may help if you are sore or have pain after a cramp or spasm. ? Put ice in a plastic bag. ? Place a towel between your skin and the bag. ? Leavethe ice on for 20 minutes, 2-3 times a day.  Try taking hot showers or baths to help relax tight muscles. Eating and drinking  Drink enough fluid to keep your urine pale yellow. Staying well hydrated may help prevent cramps or spasms.  Eat a healthy diet that includes plenty of nutrients to help your muscles function. A healthy diet includes fruits and vegetables, lean protein, whole grains, and low-fat or nonfat dairy products. General instructions  If you are having frequent cramps, avoid intense exercise for several days.  Take over-the-counter and prescription medicines only as told by your  health care provider.  Pay attention to any changes in your symptoms.  Keep all follow-up visits as told by your health care provider. This is important. Contact a health care provider if:  Your cramps or spasms get more severe or happen more often.  Your cramps or spasms do not improve over time. Summary  Muscle cramps and spasms occur when a muscle or muscles tighten and you have no control over this tightening (involuntary muscle contraction).  The most common place for cramps or spasms to  occur is in the calf muscles of the leg.  Massaging, stretching, and relaxing the affected muscle may relieve the cramp or spasm.  Drink enough fluid to keep your urine pale yellow. Staying well hydrated may help prevent cramps or spasms. This information is not intended to replace advice given to you by your health care provider. Make sure you discuss any questions you have with your health care provider. Document Released: 03/19/2002 Document Revised: 02/20/2018 Document Reviewed: 02/20/2018 Elsevier Patient Education  2020 [employer].

## 2019-08-17 ENCOUNTER — Other Ambulatory Visit: Payer: Self-pay

## 2019-08-17 DIAGNOSIS — C182 Malignant neoplasm of ascending colon: Secondary | ICD-10-CM

## 2019-08-17 NOTE — Progress Notes (Signed)
Vincent Mcclure   [phone] [phone]   Clinic Follow up Note   Patient Care Team: [name], DO as PCP - General (Family Medicine) Pyrtle, Lajuan Lines, MD as Consulting Physician (Gastroenterology) Allyn Kenner, MD as Consulting Physician (Dermatology) Dorothy Spark, MD as Consulting Physician (Cardiology) Levin Erp, [place] as Physician Assistant (Gastroenterology) Murrell Converse as Physician Assistant (Cardiology)  Date of Service:  08/20/2019  CHIEF COMPLAINT: F/u on stage III colon cancer  SUMMARY OF ONCOLOGIC HISTORY: Oncology History Overview Note  Cancer Staging Cancer of ascending colon pT4apN1 s/p right colectomy 05/19/2018 Staging form: Colon and Rectum, AJCC 8th Edition - Pathologic stage from 05/19/2018: Stage IIIB (pT4a, pN1, cM0) - Signed by Truitt Merle, MD on 05/24/2018     Cancer of ascending colon pT4apN1 s/p right colectomy 05/19/2018  05/18/2018 Imaging   05/18/2018 CT AP IMPRESSION: 1. There appears to be a nearly obstructing lesion in the region of the hepatic flexure of the colon which is highly concerning for primary colonic neoplasm. Slight haziness of the surrounding soft tissues may be indicative of early local invasion. This appears partially obstructive as evidenced by dilatation of more proximal aspects of the small bowel and colon. 2. Left adrenal lesion is stable compared to prior studies, previously characterized as an adenoma. 3. Aortic atherosclerosis, in addition to left main and 3 vessel coronary artery disease. Status post median sternotomy for CABG. 4. Additional incidental findings, as above.  Aortic Atherosclerosis (ICD10-I70.0).   05/18/2018 - 05/30/2018 Hospital Admission   Admit date: 05/18/2018 Admission diagnosis: Colon Cancer Discharged on: 05/30/2018   05/19/2018 Cancer Staging   Staging form: Colon and Rectum, AJCC 8th Edition - Pathologic stage from 05/19/2018: Stage IIIB (pT4a, pN1,  cM0) - Signed by Truitt Merle, MD on 05/24/2018    05/19/2018 Pathology Results   05/19/2018 Surgical Pathology Diagnosis Colon, segmental resection for tumor, right - INVASIVE COLORECTAL ADENOCARCINOMA, 5 CM. - CARCINOMA FOCALLY LESS THAN 0.1 CM FROM SEROSAL SURFACE. - MARGINS NOT INVOLVED. - METASTATIC CARCINOMA IN ONE OF EIGHT LYMPH NODES (1/9). - BENIGN APPENDIX.   05/19/2018 Surgery   EXPLORATORY LAPAROTOMY with right colectomy with Fanny Skates, MD   05/24/2018 Initial Diagnosis   Cancer of right colon ([place])   06/12/2018 Imaging    06/12/2018 CT CAP IMPRESSION: 1. Evidence all bowel perforation/anastomosed breakdown with large volume intraperitoneal free air fluid in the RIGHT upper quadrant adjacent and above the liver. 2. Large loculated fluid collection in the RIGHT anterior pelvis extending along the LEFT pericolic gutter consists with PERITONEAL ABSCESS. 3. Of note, no large volume intraperitoneal free fluid. Oral contrast passed through the RIGHT colon anastomosis without evidence of leak. Findings could indicate a prior anastomotic breakdown which has healed in the interval with subsequent abscess formation from the prior leak / breakdown.   06/17/2018 Procedure   06/17/2018 US Thoracentesis IMPRESSION: Successful ultrasound guided right thoracentesis yielding 1 L of pleural fluid. No pneumothorax on post-procedure chest x-ray.  No malignancy on pathology   06/20/2018 Imaging   06/20/2018 CT AP IMPRESSION: 1. Decompression of right upper quadrant air and fluid collection after percutaneous catheter drainage. There remains a tiny rim of fluid as well as small extraluminal gas bubbles in this region suspicious for continued leak from bowel. 2. Further decrease in size of residual abscess fluid collection in the pelvis after percutaneous catheter drainage. There remains some fluid around the drainage catheter in the right pelvis.   07/04/2018 Imaging  07/04/2018 CT AP  IMPRESSION: 1. Further decompression of right perihepatic abscess with resolution of extraluminal air. 2. Relatively stable volume of fluid surrounding the right pelvic drainage catheter.   07/05/2018 Imaging   07/05/2018 CT AP IMPRESSION: 1. No change compared to the CT scan performed 9 hours earlier. 2. Anterior right perihepatic space collection is decompressed by indwelling percutaneous drain, with no residual measurable collection in this location. 3. Thick walled right lower quadrant collection with indwelling percutaneous drain is stable. No new focal fluid collections. 4. Stable postsurgical changes from right hemicolectomy with no evidence of bowel obstruction. Stable reactive wall thickening in the sigmoid colon adjacent to the right lower quadrant collection. 5. Stable small to moderate dependent right pleural effusion. 6. Stable left adrenal adenoma. 7.  Aortic Atherosclerosis (ICD10-I70.0).   07/31/2018 - 01/21/2019 Chemotherapy   Adjuvant Xeloda 2000mg  bid, 7 days on, 7 days off.  Reduced to 2000mg  in the AM and 1500mg  in the PM due to diarrhea. Completed on 01/21/19    04/12/2019 Imaging   CT CAP W contrast 04/12/19  IMPRESSION: 1. Status post right hemicolectomy. 2. No definite metastatic disease in the chest, abdomen, or pelvis. 7 mm right lower lobe pulmonary nodule, obscured by atelectasis on previous chest CT and not included on multiple prior abdomen / pelvis CTs. Close follow-up recommended to ensure stability. 3.  Aortic Atherosclerois (ICD10-170.0)      CURRENT THERAPY:  Xeloda2000mg  BIDone week on,one week offstarted 07/31/2018. Reduced to 2000mg  in the AM and 1500mg  in the PM due to diarrhea.  INTERVAL HISTORY:  Vincent Mcclure is here for a follow up. He presents to the clinic alone. He notes he has pain above right hip which is constant but can flare mostly when he first gets up in the morning. This has been going on for 3 weeks. He denies any  urinary changes or blood and notes normal stool. He saw Dr. Raliegh Scarlet last week about it and she notes it was muscular related.    REVIEW OF SYSTEMS:   Constitutional: Denies fevers, chills or abnormal weight loss Eyes: Denies blurriness of vision Ears, nose, mouth, throat, and face: Denies mucositis or sore throat Respiratory: Denies cough, dyspnea or wheezes Cardiovascular: Denies palpitation, chest discomfort or lower extremity swelling Gastrointestinal:  Denies nausea, heartburn or change in bowel habits Skin: Denies abnormal skin rashes MSK: (+) pain above right hip  Lymphatics: Denies new lymphadenopathy or easy bruising Neurological:Denies numbness, tingling or new weaknesses Behavioral/Psych: Mood is stable, no new changes  All other systems were reviewed with the patient and are negative.  MEDICAL HISTORY:  Past Medical History:  Diagnosis Date  . Alcohol abuse   . Anemia   . Arthritis    hands  . Blood transfusion without reported diagnosis   . Cancer [facility])    colon cancer  . Chronic low back pain 10/14/2011  . Colon polyps    hyperplastic (2004, 2010) and adenomatous (1990).    . COLONIC POLYPS, HX OF 03/05/2008  . Esophageal stricture    hx of  . Gastric ulcer   . GERD 03/05/2008  . GERD (gastroesophageal reflux disease) 1994   associated peptic strictures  . HYPERLIPIDEMIA 03/05/2008  . HYPERTENSION 03/05/2008  . Hypertension   . IBS 03/05/2008  . Impaired glucose tolerance 10/12/2011  . Iron deficiency anemia   . LEG CRAMPS 09/02/2010  . Myocardial infarction [facility])    May 2018  . PAD (peripheral artery disease) ([place]) 10/14/2011  . PAD (  peripheral artery disease) ([place]) 2013  . PERIPHERAL EDEMA 09/02/2010  . PERIPHERAL NEUROPATHY 03/05/2008  . Prostatitis    hx of  . VARICOSE VEINS, LOWER EXTREMITIES 06/09/2009    SURGICAL HISTORY: Past Surgical History:  Procedure Laterality Date  . CATARACT EXTRACTION  bilat  . COLECTOMY    . COLONOSCOPY    . CORONARY  ARTERY BYPASS GRAFT N/A 02/15/2017   Procedure: CORONARY ARTERY BYPASS GRAFTING times three  with left internal mammary harvest and endoscopic harvest of Right SVG. Grafts of LIMA to  LAD, SVG to Distal Circ, and to First Diag.;  Surgeon: Grace Isaac, MD;  Location: [place];  Service: Open Heart Surgery;  Laterality: N/A;  . ESOPHAGOGASTRODUODENOSCOPY N/A 11/14/2014   Procedure: ESOPHAGOGASTRODUODENOSCOPY (EGD);  Surgeon: Jerene Bears, MD;  Location: [facility] ENDOSCOPY;  Service: Endoscopy;  Laterality: N/A;  . IR RADIOLOGIST EVAL & MGMT  07/04/2018  . IR RADIOLOGIST EVAL & MGMT  07/18/2018  . LAPAROTOMY N/A 05/19/2018   Procedure: EXPLORATORY LAPAROTOMY RIGHT COLECTOMY;  Surgeon: Fanny Skates, MD;  Location: [facility] ORS;  Service: General;  Laterality: N/A;  . LEFT HEART CATH AND CORONARY ANGIOGRAPHY N/A 02/14/2017   Procedure: Left Heart Cath and Coronary Angiography;  Surgeon: Belva Crome, MD;  Location: [place] [facility];  Service: Cardiovascular;  Laterality: N/A;  . LUMBAR SPINE SURGERY  11/2008   Dr Joya Salm  . TEE WITHOUT CARDIOVERSION N/A 02/15/2017   Procedure: TRANSESOPHAGEAL ECHOCARDIOGRAM (TEE);  Surgeon: Grace Isaac, MD;  Location: [place];  Service: Open Heart Surgery;  Laterality: N/A;  . UPPER GASTROINTESTINAL ENDOSCOPY      I have reviewed the social history and family history with the patient and they are unchanged from previous note.  ALLERGIES:  is allergic to no known allergies.  MEDICATIONS:  Current Outpatient Medications  Medication Sig Dispense Refill  . acetaminophen (TYLENOL) 500 MG tablet Take 1 tablet (500 mg total) by mouth every 6 (six) hours as needed for mild pain.  0  . amitriptyline (ELAVIL) 50 MG tablet Take 1 tablet (50 mg total) by mouth at bedtime. -burn/tingling in feet/ankles 90 tablet 0  . aspirin EC 81 MG EC tablet Take 1 tablet (81 mg total) by mouth daily. 30 tablet 1  . Cholecalciferol (VITAMIN D-3 PO) Take 2,000 Units by mouth daily.    . Ferrous  Sulfate (IRON) 325 (65 Fe) MG TABS TAKE 1 TABLET BY MOUTH THREE TIMES DAILY WITH MEALS 90 tablet 0  . metoprolol succinate (TOPROL-XL) 50 MG 24 hr tablet Take 1 tablet (50 mg total) by mouth daily. Refill by cards in future 90 tablet 0  . Multiple Vitamin (MULTIVITAMIN WITH MINERALS) TABS tablet Take 1 tablet by mouth daily.    . nitroGLYCERIN (NITROSTAT) 0.4 MG SL tablet Place 1 tablet (0.4 mg total) under the tongue every 5 (five) minutes as needed for chest pain (or tightness). Refill per cardiology in future 12 tablet 0  . pravastatin (PRAVACHOL) 40 MG tablet Take 1 tablet (40 mg total) by mouth at bedtime. Refill by cardiology in future 90 tablet 1   No current facility-administered medications for this visit.     PHYSICAL EXAMINATION: ECOG PERFORMANCE STATUS: 1 - Symptomatic but completely ambulatory  Vitals:   08/20/19 1046  BP: (!) 149/94  Pulse: 85  Resp: 20  Temp: 98.3 F (36.8 C)  SpO2: 98%   Filed Weights   08/20/19 1046  Weight: 228 lb 1.6 oz (103.5 kg)    GENERAL:alert,  no distress and comfortable SKIN: skin color, texture, turgor are normal, no rashes or significant lesions EYES: normal, Conjunctiva are pink and non-injected, sclera clear  NECK: supple, thyroid normal size, non-tender, without nodularity LYMPH:  no palpable lymphadenopathy in the cervical, axillary  LUNGS: clear to auscultation and percussion with normal breathing effort HEART: regular rate & rhythm and no murmurs and no lower extremity edema ABDOMEN:abdomen soft, non-tender and normal bowel sounds (+) mild tenderness of lower right flank (+) mild tenderness of RUQ, no hepatomegaly  Musculoskeletal:no cyanosis of digits and no clubbing  NEURO: alert & oriented x 3 with fluent speech, no focal motor/sensory deficits  LABORATORY DATA:  I have reviewed the data as listed CBC Latest Ref Rng & Units 08/20/2019 04/24/2019 04/10/2019  WBC 4.0 - 10.5 K/uL 4.8 4.5 5.1  Hemoglobin 13.0 - 17.0 g/dL 14.4 15.2  14.7  Hematocrit 39.0 - 52.0 % 43.1 44.8 45.5  Platelets 150 - 400 K/uL 180 192 154     CMP Latest Ref Rng & Units 08/20/2019 04/24/2019 04/10/2019  Glucose 70 - 99 mg/dL 117(H) 114(H) 80  BUN 8 - 23 mg/dL 13 11 13   Creatinine 0.61 - 1.24 mg/dL 0.99 1.02 1.09  Sodium 135 - 145 mmol/L 138 138 140  Potassium 3.5 - 5.1 mmol/L 4.3 4.4 4.3  Chloride 98 - 111 mmol/L 105 101 105  CO2 22 - 32 mmol/L 24 22 26   Calcium 8.9 - 10.3 mg/dL 8.8(L) 9.4 8.9  Total Protein 6.5 - 8.1 g/dL 6.7 6.3 7.1  Total Bilirubin 0.3 - 1.2 mg/dL 0.6 0.4 0.7  Alkaline Phos 38 - 126 U/L 86 99 102  AST 15 - 41 U/L 16 14 19   ALT 0 - 44 U/L 14 10 15       RADIOGRAPHIC STUDIES: I have personally reviewed the radiological images as listed and agreed with the findings in the report. No results found.   ASSESSMENT & PLAN:  Vincent Mcclure is a 85 y.o. male with   1. Adenocarcinoma of right colon, invasive adenocarcinoma, stage IIIB (pT4a, pN1, cM0), MSS -Diagnosed in 05/2018. Treated with hemicolectomy. He receivedadjuvantXeloda 2000 mg BID one week on, one week off from 07/31/2018 to 01/29/2019, he overall tolerated well. Continue cancer surveillance.  -He is clinically stable with recent right flank pain. Physical exam shows mild tenderness of lower right flank and mild tenderness of RUQ. Labs reviewed, CBC and CMP WNL with mildly low calcium. I encouraged him to take oral calcium.  -I will obtain CT AP in 1-2 weeks given recent pain, to rule out cancer recurrence. He is agreeable.  -Phone visit in 2 weeks to review scan findings   2. Anemia, due to chronic blood lossand iron deficiency -He is currently on oral iron.Improved after surgery -Anemia has resolved   3. CAD S/P CABG -f/u with Dr Raliegh Scarlet and Dr Meda Coffee.  -stable, no chest pain lately   4. 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 Lower right flank pain  -He  notes for the past 3 weeks having pain above his right hip that is constant but flares with first movements of the day.  -I recommend he get scan to further evaluate, he is agreeable. If normal this could be muscular as Dr Raliegh Scarlet suspected.  -I also recommend urine culture to evaluate for UTI, He is agreeable.    Plan -UA and urine culture today  -CT AP W Contrast in 1-2 weeks  -  Phone visit in 2 weeks after scan   No problem-specific Assessment & Plan notes found for this encounter.   Orders Placed This Encounter  Procedures  . Urine Culture    Standing Status:   Future    Standing Expiration Date:   08/20/2020  . CT Abdomen Pelvis W Contrast    Right upper abdominal pain for 3 weeks, radiates to back, rule out cancer recurrence    Standing Status:   Future    Standing Expiration Date:   08/19/2020    Order Specific Question:   If indicated for the ordered procedure, I authorize the administration of contrast media per Radiology protocol    Answer:   Yes    Order Specific Question:   Preferred imaging location?    Answer:   [facility]    Order Specific Question:   Is Oral Contrast requested for this exam?    Answer:   Yes, Per Radiology protocol    Order Specific Question:   Radiology Contrast Protocol - do NOT remove file path    Answer:   [email or web address]  . Urinalysis, Complete w Microscopic    Standing Status:   Future    Standing Expiration Date:   08/20/2020   All questions were answered. The patient knows to call the clinic with any problems, questions or concerns. No barriers to learning was detected. I spent 20 minutes counseling the patient face to face. The total time spent in the appointment was 25 minutes and more than 50% was on counseling and review of test results     Truitt Merle, MD 08/20/2019   I, Joslyn Devon, am acting as scribe for Truitt Merle, MD.   I have reviewed the above documentation for accuracy and completeness,  and I agree with the above.

## 2019-08-20 ENCOUNTER — Inpatient Hospital Stay: Payer: Medicare HMO | Admitting: Hematology

## 2019-08-20 ENCOUNTER — Inpatient Hospital Stay: Payer: Medicare HMO | Attending: Hematology

## 2019-08-20 ENCOUNTER — Other Ambulatory Visit: Payer: Self-pay

## 2019-08-20 VITALS — BP 149/94 | HR 85 | Temp 98.3°F | Resp 20 | Ht 71.0 in | Wt 228.1 lb

## 2019-08-20 DIAGNOSIS — I2510 Atherosclerotic heart disease of native coronary artery without angina pectoris: Secondary | ICD-10-CM | POA: Insufficient documentation

## 2019-08-20 DIAGNOSIS — I10 Essential (primary) hypertension: Secondary | ICD-10-CM | POA: Insufficient documentation

## 2019-08-20 DIAGNOSIS — R109 Unspecified abdominal pain: Secondary | ICD-10-CM | POA: Diagnosis not present

## 2019-08-20 DIAGNOSIS — C182 Malignant neoplasm of ascending colon: Secondary | ICD-10-CM | POA: Diagnosis not present

## 2019-08-20 DIAGNOSIS — G620 Drug-induced polyneuropathy: Secondary | ICD-10-CM | POA: Diagnosis not present

## 2019-08-20 DIAGNOSIS — M25551 Pain in right hip: Secondary | ICD-10-CM | POA: Diagnosis not present

## 2019-08-20 LAB — CMP (CANCER CENTER ONLY)
ALT: 14 U/L (ref 0–44)
AST: 16 U/L (ref 15–41)
Albumin: 3.9 g/dL (ref 3.5–5.0)
Alkaline Phosphatase: 86 U/L (ref 38–126)
Anion gap: 9 (ref 5–15)
BUN: 13 mg/dL (ref 8–23)
CO2: 24 mmol/L (ref 22–32)
Calcium: 8.8 mg/dL — ABNORMAL LOW (ref 8.9–10.3)
Chloride: 105 mmol/L (ref 98–111)
Creatinine: 0.99 mg/dL (ref 0.61–1.24)
GFR, Est AFR Am: >60 mL/min (ref >60)
GFR, Estimated: >60 mL/min (ref >60)
Glucose, Bld: 117 mg/dL — ABNORMAL HIGH (ref 70–99)
Potassium: 4.3 mmol/L (ref 3.5–5.1)
Sodium: 138 mmol/L (ref 135–145)
Total Bilirubin: 0.6 mg/dL (ref 0.3–1.2)
Total Protein: 6.7 g/dL (ref 6.5–8.1)

## 2019-08-20 LAB — CBC WITH DIFFERENTIAL (CANCER CENTER ONLY)
Abs Immature Granulocytes: 0.02 10*3/uL (ref 0.00–0.07)
Basophils Absolute: 0 10*3/uL (ref 0.0–0.1)
Basophils Relative: 0 %
Eosinophils Absolute: 0.1 10*3/uL (ref 0.0–0.5)
Eosinophils Relative: 2 %
HCT: 43.1 % (ref 39.0–52.0)
Hemoglobin: 14.4 g/dL (ref 13.0–17.0)
Immature Granulocytes: 0 %
Lymphocytes Relative: 26 %
Lymphs Abs: 1.2 10*3/uL (ref 0.7–4.0)
MCH: 29.8 pg (ref 26.0–34.0)
MCHC: 33.4 g/dL (ref 30.0–36.0)
MCV: 89.2 fL (ref 80.0–100.0)
Monocytes Absolute: 0.5 10*3/uL (ref 0.1–1.0)
Monocytes Relative: 11 %
Neutro Abs: 2.9 10*3/uL (ref 1.7–7.7)
Neutrophils Relative %: 61 %
Platelet Count: 180 10*3/uL (ref 150–400)
RBC: 4.83 MIL/uL (ref 4.22–5.81)
RDW: 13.6 % (ref 11.5–15.5)
WBC Count: 4.8 10*3/uL (ref 4.0–10.5)
nRBC: 0 % (ref 0.0–0.2)

## 2019-08-20 LAB — FERRITIN: Ferritin: 150 ng/mL (ref 24–336)

## 2019-08-20 LAB — CEA (IN HOUSE-CHCC): CEA (CHCC-In House): 2.03 ng/mL (ref 0.00–5.00)

## 2019-08-21 ENCOUNTER — Encounter: Payer: Self-pay | Admitting: Hematology

## 2019-08-21 ENCOUNTER — Telehealth: Payer: Self-pay | Admitting: Hematology

## 2019-08-21 NOTE — Telephone Encounter (Signed)
Scheduled appt per 11/9 los.  Spoke with pt spouse and she is aware of the appt date and time.

## 2019-08-28 ENCOUNTER — Telehealth: Payer: Self-pay | Admitting: *Deleted

## 2019-08-28 NOTE — Telephone Encounter (Signed)
Left message on home # to call back for good lab results.

## 2019-08-28 NOTE — Telephone Encounter (Signed)
-----   Message from Truitt Merle, MD sent at 08/28/2019  9:45 AM EST ----- Please let pt know his tumor marker and iron level was good last week, no concerns, thanks   Truitt Merle

## 2019-08-28 NOTE — Telephone Encounter (Signed)
Pt returned call earlier & left vm but would be leaving house soon.  His wife called back & message given of good lab results per Dr [name].

## 2019-08-30 NOTE — Progress Notes (Signed)
[place]   [phone] [phone]   Clinic Follow up Note   Patient Care Team: [name], DO as PCP - General (Family Medicine) Pyrtle, Lajuan Lines, MD as Consulting Physician (Gastroenterology) Allyn Kenner, MD as Consulting Physician (Dermatology) Dorothy Spark, MD as Consulting Physician (Cardiology) Levin Erp, [place] as Physician Assistant (Gastroenterology) Murrell Converse as Physician Assistant (Cardiology)   I connected with Vincent Mcclure on 09/04/2019 at  8:40 AM EST by telephone visit and verified that I am speaking with the correct person using two identifiers.  I discussed the limitations, risks, security and privacy concerns of performing an evaluation and management service by telephone and the availability of in person appointments. I also discussed with the patient that there may be a patient responsible charge related to this service. The patient expressed understanding and agreed to proceed.   Patient's location:  His home Provider's location:  My Office  CHIEF COMPLAINT: F/u on stage III colon cancer  SUMMARY OF ONCOLOGIC HISTORY: Oncology History Overview Note  Cancer Staging Cancer of ascending colon pT4apN1 s/p right colectomy 05/19/2018 Staging form: Colon and Rectum, AJCC 8th Edition - Pathologic stage from 05/19/2018: Stage IIIB (pT4a, pN1, cM0) - Signed by Truitt Merle, MD on 05/24/2018     Cancer of ascending colon pT4apN1 s/p right colectomy 05/19/2018  05/18/2018 Imaging   05/18/2018 CT AP IMPRESSION: 1. There appears to be a nearly obstructing lesion in the region of the hepatic flexure of the colon which is highly concerning for primary colonic neoplasm. Slight haziness of the surrounding soft tissues may be indicative of early local invasion. This appears partially obstructive as evidenced by dilatation of more proximal aspects of the small bowel and colon. 2. Left adrenal lesion is stable compared  to prior studies, previously characterized as an adenoma. 3. Aortic atherosclerosis, in addition to left main and 3 vessel coronary artery disease. Status post median sternotomy for CABG. 4. Additional incidental findings, as above.  Aortic Atherosclerosis (ICD10-I70.0).   05/18/2018 - 05/30/2018 Hospital Admission   Admit date: 05/18/2018 Admission diagnosis: Colon Cancer Discharged on: 05/30/2018   05/19/2018 Cancer Staging   Staging form: Colon and Rectum, AJCC 8th Edition - Pathologic stage from 05/19/2018: Stage IIIB (pT4a, pN1, cM0) - Signed by Truitt Merle, MD on 05/24/2018    05/19/2018 Pathology Results   05/19/2018 Surgical Pathology Diagnosis Colon, segmental resection for tumor, right - INVASIVE COLORECTAL ADENOCARCINOMA, 5 CM. - CARCINOMA FOCALLY LESS THAN 0.1 CM FROM SEROSAL SURFACE. - MARGINS NOT INVOLVED. - METASTATIC CARCINOMA IN ONE OF EIGHT LYMPH NODES (1/9). - BENIGN APPENDIX.   05/19/2018 Surgery   EXPLORATORY LAPAROTOMY with right colectomy with Fanny Skates, MD   05/24/2018 Initial Diagnosis   Cancer of right colon ([place])   06/12/2018 Imaging    06/12/2018 CT CAP IMPRESSION: 1. Evidence all bowel perforation/anastomosed breakdown with large volume intraperitoneal free air fluid in the RIGHT upper quadrant adjacent and above the liver. 2. Large loculated fluid collection in the RIGHT anterior pelvis extending along the LEFT pericolic gutter consists with PERITONEAL ABSCESS. 3. Of note, no large volume intraperitoneal free fluid. Oral contrast passed through the RIGHT colon anastomosis without evidence of leak. Findings could indicate a prior anastomotic breakdown which has healed in the interval with subsequent abscess formation from the prior leak / breakdown.   06/17/2018 Procedure   06/17/2018 US Thoracentesis IMPRESSION: Successful ultrasound guided right thoracentesis yielding 1 L of pleural fluid. No pneumothorax on post-procedure  chest x-ray.  No  malignancy on pathology   06/20/2018 Imaging   06/20/2018 CT AP IMPRESSION: 1. Decompression of right upper quadrant air and fluid collection after percutaneous catheter drainage. There remains a tiny rim of fluid as well as small extraluminal gas bubbles in this region suspicious for continued leak from bowel. 2. Further decrease in size of residual abscess fluid collection in the pelvis after percutaneous catheter drainage. There remains some fluid around the drainage catheter in the right pelvis.   07/04/2018 Imaging   07/04/2018 CT AP IMPRESSION: 1. Further decompression of right perihepatic abscess with resolution of extraluminal air. 2. Relatively stable volume of fluid surrounding the right pelvic drainage catheter.   07/05/2018 Imaging   07/05/2018 CT AP IMPRESSION: 1. No change compared to the CT scan performed 9 hours earlier. 2. Anterior right perihepatic space collection is decompressed by indwelling percutaneous drain, with no residual measurable collection in this location. 3. Thick walled right lower quadrant collection with indwelling percutaneous drain is stable. No new focal fluid collections. 4. Stable postsurgical changes from right hemicolectomy with no evidence of bowel obstruction. Stable reactive wall thickening in the sigmoid colon adjacent to the right lower quadrant collection. 5. Stable small to moderate dependent right pleural effusion. 6. Stable left adrenal adenoma. 7.  Aortic Atherosclerosis (ICD10-I70.0).   07/31/2018 - 01/21/2019 Chemotherapy   Adjuvant Xeloda 2000mg  bid, 7 days on, 7 days off.  Reduced to 2000mg  in the AM and 1500mg  in the PM due to diarrhea. Completed on 01/21/19    04/12/2019 Imaging   CT CAP W contrast 04/12/19  IMPRESSION: 1. Status post right hemicolectomy. 2. No definite metastatic disease in the chest, abdomen, or pelvis. 7 mm right lower lobe pulmonary nodule, obscured by atelectasis on previous chest CT and not included  on multiple prior abdomen / pelvis CTs. Close follow-up recommended to ensure stability. 3.  Aortic Atherosclerois (ICD10-170.0)   09/04/2019 Imaging   CT AP W Contrast I MPRESSION: 1. No evidence of local colon cancer recurrence at the RIGHT hemicolectomy site. 2. No evidence of metastatic disease in the abdomen pelvis. 3. Mild nodularity at the LEFT lung base is favored benign. Recommend attention on routine surveillance 4. Aortic Atherosclerosis (ICD10-I70.0).      CURRENT THERAPY:  Surveillance   INTERVAL HISTORY:  Vincent Mcclure is here for a follow up. He notes he is doing well. He has not been evaluated for his recent hip pain. He notes his severe hip pain eased off. He feels his pain is related to kidney stone passing. He otherwise is doing well.    REVIEW OF SYSTEMS:   Constitutional: Denies fevers, chills or abnormal weight loss Eyes: Denies blurriness of vision Ears, nose, mouth, throat, and face: Denies mucositis or sore throat Respiratory: Denies cough, dyspnea or wheezes Cardiovascular: Denies palpitation, chest discomfort or lower extremity swelling Gastrointestinal:  Denies nausea, heartburn or change in bowel habits Skin: Denies abnormal skin rashes Lymphatics: Denies new lymphadenopathy or easy bruising Neurological:Denies numbness, tingling or new weaknesses Behavioral/Psych: Mood is stable, no new changes  All other systems were reviewed with the patient and are negative.  MEDICAL HISTORY:  Past Medical History:  Diagnosis Date   Alcohol abuse    Anemia    Arthritis    hands   Blood transfusion without reported diagnosis    Cancer ([place])    colon cancer   Chronic low back pain 10/14/2011   Colon polyps    hyperplastic (2004, 2010) and adenomatous (  1990).     COLONIC POLYPS, HX OF 03/05/2008   Esophageal stricture    hx of   Gastric ulcer    GERD 03/05/2008   GERD (gastroesophageal reflux disease) 1994   associated peptic strictures     HYPERLIPIDEMIA 03/05/2008   HYPERTENSION 03/05/2008   Hypertension    IBS 03/05/2008   Impaired glucose tolerance 10/12/2011   Iron deficiency anemia    LEG CRAMPS 09/02/2010   Myocardial infarction [facility])    May 2018   PAD (peripheral artery disease) ([place]) 10/14/2011   PAD (peripheral artery disease) ([place]) 2013   PERIPHERAL EDEMA 09/02/2010   PERIPHERAL NEUROPATHY 03/05/2008   Prostatitis    hx of   VARICOSE VEINS, LOWER EXTREMITIES 06/09/2009    SURGICAL HISTORY: Past Surgical History:  Procedure Laterality Date   CATARACT EXTRACTION  bilat   COLECTOMY     COLONOSCOPY     CORONARY ARTERY BYPASS GRAFT N/A 02/15/2017   Procedure: CORONARY ARTERY BYPASS GRAFTING times three  with left internal mammary harvest and endoscopic harvest of Right SVG. Grafts of LIMA to  LAD, SVG to Distal Circ, and to First Diag.;  Surgeon: Grace Isaac, MD;  Location: [facility];  Service: Open Heart Surgery;  Laterality: N/A;   ESOPHAGOGASTRODUODENOSCOPY N/A 11/14/2014   Procedure: ESOPHAGOGASTRODUODENOSCOPY (EGD);  Surgeon: Jerene Bears, MD;  Location: [facility];  Service: Endoscopy;  Laterality: N/A;   IR RADIOLOGIST EVAL & MGMT  07/04/2018   IR RADIOLOGIST EVAL & MGMT  07/18/2018   LAPAROTOMY N/A 05/19/2018   Procedure: EXPLORATORY LAPAROTOMY RIGHT COLECTOMY;  Surgeon: Fanny Skates, MD;  Location: [facility];  Service: General;  Laterality: N/A;   LEFT HEART CATH AND CORONARY ANGIOGRAPHY N/A 02/14/2017   Procedure: Left Heart Cath and Coronary Angiography;  Surgeon: Belva Crome, MD;  Location: [facility];  Service: Cardiovascular;  Laterality: N/A;   LUMBAR SPINE SURGERY  11/2008   Dr Joya Salm   TEE WITHOUT CARDIOVERSION N/A 02/15/2017   Procedure: TRANSESOPHAGEAL ECHOCARDIOGRAM (TEE);  Surgeon: Grace Isaac, MD;  Location: [place];  Service: Open Heart Surgery;  Laterality: N/A;   UPPER GASTROINTESTINAL ENDOSCOPY      I have reviewed the social history and family history  with the patient and they are unchanged from previous note.  ALLERGIES:  is allergic to no known allergies.  MEDICATIONS:  Current Outpatient Medications  Medication Sig Dispense Refill   acetaminophen (TYLENOL) 500 MG tablet Take 1 tablet (500 mg total) by mouth every 6 (six) hours as needed for mild pain.  0   amitriptyline (ELAVIL) 50 MG tablet Take 1 tablet (50 mg total) by mouth at bedtime. -burn/tingling in feet/ankles 90 tablet 0   aspirin EC 81 MG EC tablet Take 1 tablet (81 mg total) by mouth daily. 30 tablet 1   Cholecalciferol (VITAMIN D-3 PO) Take 2,000 Units by mouth daily.     Ferrous Sulfate (IRON) 325 (65 Fe) MG TABS TAKE 1 TABLET BY MOUTH THREE TIMES DAILY WITH MEALS 90 tablet 0   metoprolol succinate (TOPROL-XL) 50 MG 24 hr tablet Take 1 tablet (50 mg total) by mouth daily. Refill by cards in future 90 tablet 0   Multiple Vitamin (MULTIVITAMIN WITH MINERALS) TABS tablet Take 1 tablet by mouth daily.     nitroGLYCERIN (NITROSTAT) 0.4 MG SL tablet Place 1 tablet (0.4 mg total) under the tongue every 5 (five) minutes as needed for chest pain (or tightness). Refill per cardiology in future 12 tablet  0   pravastatin (PRAVACHOL) 40 MG tablet Take 1 tablet (40 mg total) by mouth at bedtime. Refill by cardiology in future 90 tablet 1   No current facility-administered medications for this visit.     PHYSICAL EXAMINATION: ECOG PERFORMANCE STATUS: 0 - Asymptomatic  No vitals taken today, Exam not performed today   LABORATORY DATA:  I have reviewed the data as listed CBC Latest Ref Rng & Units 08/20/2019 04/24/2019 04/10/2019  WBC 4.0 - 10.5 K/uL 4.8 4.5 5.1  Hemoglobin 13.0 - 17.0 g/dL 14.4 15.2 14.7  Hematocrit 39.0 - 52.0 % 43.1 44.8 45.5  Platelets 150 - 400 K/uL 180 192 154     CMP Latest Ref Rng & Units 08/20/2019 04/24/2019 04/10/2019  Glucose 70 - 99 mg/dL 117(H) 114(H) 80  BUN 8 - 23 mg/dL 13 11 13   Creatinine 0.61 - 1.24 mg/dL 0.99 1.02 1.09  Sodium 135 -  145 mmol/L 138 138 140  Potassium 3.5 - 5.1 mmol/L 4.3 4.4 4.3  Chloride 98 - 111 mmol/L 105 101 105  CO2 22 - 32 mmol/L 24 22 26   Calcium 8.9 - 10.3 mg/dL 8.8(L) 9.4 8.9  Total Protein 6.5 - 8.1 g/dL 6.7 6.3 7.1  Total Bilirubin 0.3 - 1.2 mg/dL 0.6 0.4 0.7  Alkaline Phos 38 - 126 U/L 86 99 102  AST 15 - 41 U/L 16 14 19   ALT 0 - 44 U/L 14 10 15       RADIOGRAPHIC STUDIES: I have personally reviewed the radiological images as listed and agreed with the findings in the report. Ct Abdomen Pelvis W Contrast  Result Date: 09/03/2019 CLINICAL DATA:  Adenocarcinoma RIGHT colon. Stage T4 a N1. RIGHT hemicolectomy. Diagnosis 2019 EXAM: CT ABDOMEN AND PELVIS WITH CONTRAST TECHNIQUE: Multidetector CT imaging of the abdomen and pelvis was performed using the standard protocol following bolus administration of intravenous contrast. CONTRAST:  170mL OMNIPAQUE IOHEXOL 300 MG/ML  SOLN COMPARISON:  CT 04/12/2019 FINDINGS: Lower chest: Mild nodularity LEFT lung base (image 34/4) at site of prior atelectasis. Hepatobiliary: No focal hepatic lesion. No biliary duct dilatation. Gallbladder is normal. Common bile duct is normal. Pancreas: Pancreas is normal. No ductal dilatation. No pancreatic inflammation. Spleen: Normal spleen Adrenals/urinary tract: Enlargement of the LEFT adrenal gland to 1.8 cm similar comparison exam. Lesion has washout characteristics consistent benign adenoma (greater than 40%). RIGHT adrenal gland normal. Low-density lesion in the LEFT renal cortex most consistent benign renal cysts. Ureters and bladder normal Stomach/Bowel: Stomach, duodenum and small-bowel normal. Post RIGHT hemicolectomy. Normal anastomosis. Transverse and descending colon normal. Rectosigmoid colon normal. No adenopathy in the mesentery of the colon. Vascular/Lymphatic: Abdominal aorta is normal caliber with atherosclerotic calcification. There is no retroperitoneal or periportal lymphadenopathy. No pelvic lymphadenopathy.  Reproductive: Prostate normal Other: No free fluid. Musculoskeletal: No aggressive osseous lesion. IMPRESSION: 1. No evidence of local colon cancer recurrence at the RIGHT hemicolectomy site. 2. No evidence of metastatic disease in the abdomen pelvis. 3. Mild nodularity at the LEFT lung base is favored benign. Recommend attention on routine surveillance 4. Aortic Atherosclerosis (ICD10-I70.0). Electronically Signed   By: Suzy Bouchard M.D.   On: 09/03/2019 15:27     ASSESSMENT & PLAN:  Vincent Mcclure is a 85 y.o. male with   1. Adenocarcinoma of right colon, invasive adenocarcinoma, stage IIIB (pT4a, pN1, cM0), MSS -Diagnosed in 05/2018. Treated with hemicolectomy. HereceivedadjuvantXeloda 2000 mg BID one week on, one week offfrom10/21/2019 to 01/29/2019, he overall tolerated well. Continue cancer surveillance.  -We  discussed his CT AP from 09/03/19 which shows NED.  -He is overall clinically stable with no more hip pain.  -F/u in 3 months   2. Anemia, due to chronic blood lossand iron deficiency -He is currently on oral iron.Improved after surgery -Anemia has resolved   3. CAD S/P CABG -f/u with Dr Raliegh Scarlet and Dr Meda Coffee.  -stable, no chest pain lately   4. 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 Lower right flank pain  -He notes for the past 3 weeks having pain above his right hip that is constant but flares with first movements of the day.  -Pain recently resolved which he attributes to possible passed kidney stone.  -No indications of concerning pain source on CT AP from 09/03/19, no kidney stone on scan.    Plan -CT AP reviewed, NED  -Lab and f/u in 3 months    No problem-specific Assessment & Plan notes found for this encounter.   No orders of the defined types were placed in this encounter.  I discussed the assessment and treatment plan with the patient. The patient was  provided an opportunity to ask questions and all were answered. The patient agreed with the plan and demonstrated an understanding of the instructions.  The patient was advised to call back or seek an in-person evaluation if the symptoms worsen or if the condition fails to improve as anticipated.  I provided 12 minutes of non face-to-face telephone visit time during this encounter, and > 50% was spent counseling as documented under my assessment & plan.    Truitt Merle, MD 09/04/2019   I, Joslyn Devon, am acting as scribe for Truitt Merle, MD.   I have reviewed the above documentation for accuracy and completeness, and I agree with the above.

## 2019-09-03 ENCOUNTER — Ambulatory Visit (HOSPITAL_COMMUNITY)
Admission: RE | Admit: 2019-09-03 | Discharge: 2019-09-03 | Disposition: A | Discharge status: Home / Self Care | Payer: Medicare HMO | Source: Ambulatory Visit | Attending: Hematology | Admitting: Hematology

## 2019-09-03 ENCOUNTER — Other Ambulatory Visit: Payer: Self-pay

## 2019-09-03 ENCOUNTER — Telehealth: Payer: Self-pay | Admitting: Hematology

## 2019-09-03 ENCOUNTER — Encounter (HOSPITAL_COMMUNITY): Payer: Self-pay

## 2019-09-03 DIAGNOSIS — C189 Malignant neoplasm of colon, unspecified: Secondary | ICD-10-CM | POA: Diagnosis not present

## 2019-09-03 DIAGNOSIS — C182 Malignant neoplasm of ascending colon: Secondary | ICD-10-CM | POA: Insufficient documentation

## 2019-09-03 MED ORDER — IOHEXOL 300 MG/ML  SOLN
100.0000 mL | Freq: Once | INTRAMUSCULAR | Status: AC | PRN
  Administered 2019-09-03: 100 mL via INTRAVENOUS

## 2019-09-03 MED ORDER — SODIUM CHLORIDE (PF) 0.9 % IJ SOLN
INTRAMUSCULAR | Status: AC
  Filled 2019-09-03: qty 50

## 2019-09-03 NOTE — Telephone Encounter (Signed)
Confirmed telephone encounter for 11/24 and verified info

## 2019-09-04 ENCOUNTER — Encounter: Payer: Self-pay | Admitting: Hematology

## 2019-09-04 ENCOUNTER — Inpatient Hospital Stay (HOSPITAL_BASED_OUTPATIENT_CLINIC_OR_DEPARTMENT_OTHER): Payer: Medicare HMO | Admitting: Hematology

## 2019-09-04 DIAGNOSIS — C182 Malignant neoplasm of ascending colon: Secondary | ICD-10-CM | POA: Diagnosis not present

## 2019-09-05 ENCOUNTER — Telehealth: Payer: Self-pay | Admitting: Hematology

## 2019-09-05 NOTE — Telephone Encounter (Signed)
Scheduled appt per 11/24 los.  Spoke with pt spouse and she is aware of the appt date and time,.

## 2019-09-28 IMAGING — CT CT IMAGE GUIDED DRAINAGE BY PERCUTANEOUS CATHETER
1 of 3 series · 13 of 32 positions shown, 18 images · non-contrast
Comparison: none

INDICATION: 86-year-old male with a history of right lower quadrant fluid
collection. He has been referred for drainage.
TECHNIQUE: Informed written consent was obtained from the patient after a
thorough discussion of the procedural risks, benefits and
alternatives. All questions were addressed. Maximal Sterile Barrier
Technique was utilized including caps, mask, sterile gowns, sterile
gloves, sterile drape, hand hygiene and skin antiseptic. A timeout
was performed prior to the initiation of the procedure.

[Series 2: i-spiral 5.0 b31f · axial · 0.98mm/px · z∈[-600,-345]mm · 13 of 85 slices shown, 18 images]
[im 6/85  soft-tissue]
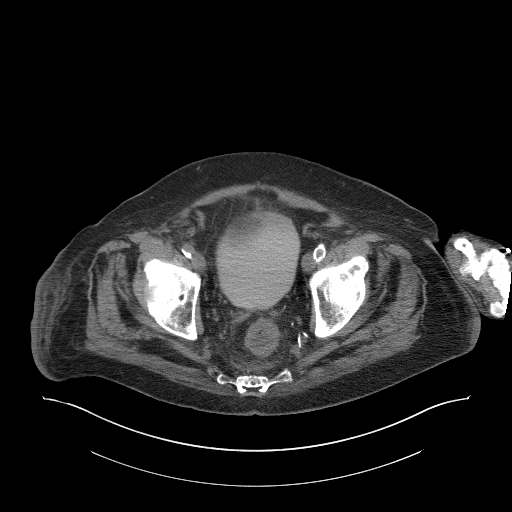
[im 6/85  bone]
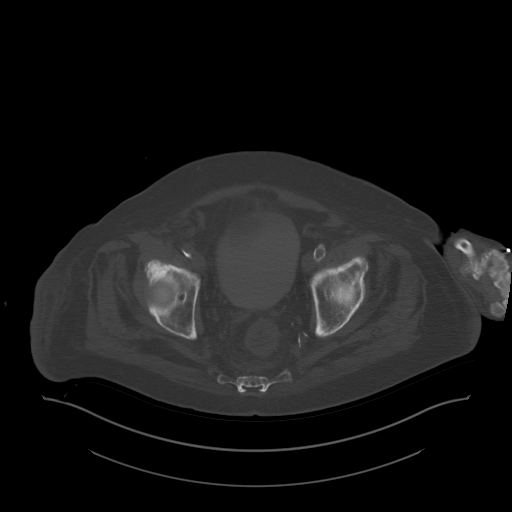
[im 11/85  soft-tissue]
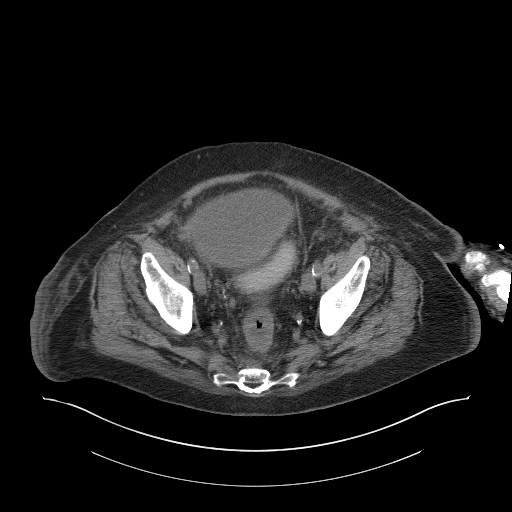
[im 22/85  soft-tissue]
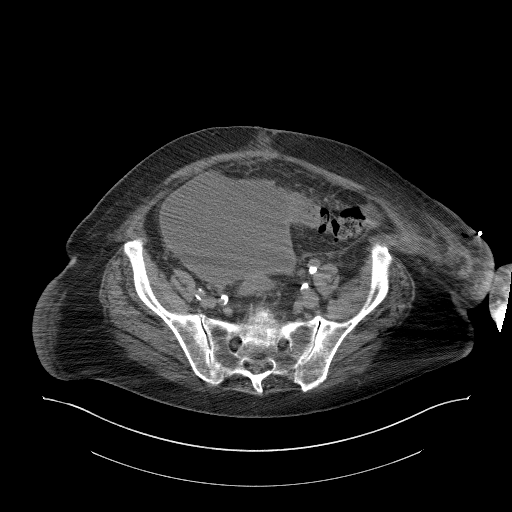
[im 27/85  soft-tissue]
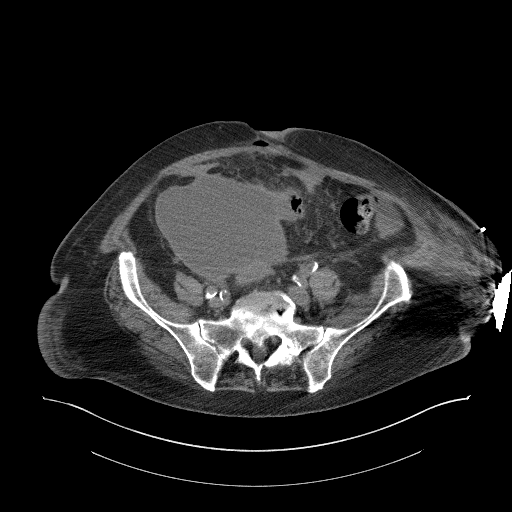
[im 32/85  soft-tissue]
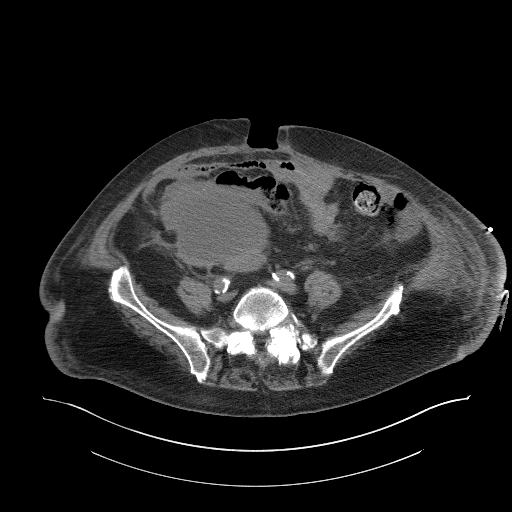
[im 37/85  soft-tissue]
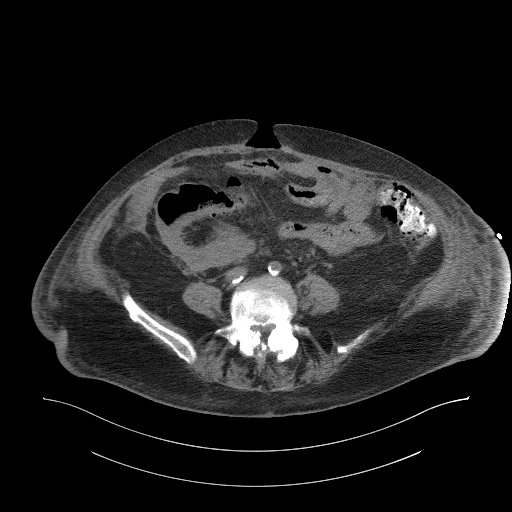
[im 48/85  soft-tissue]
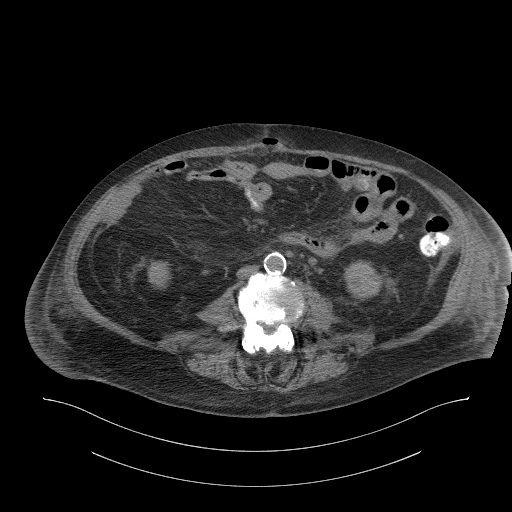
[im 53/85  soft-tissue]
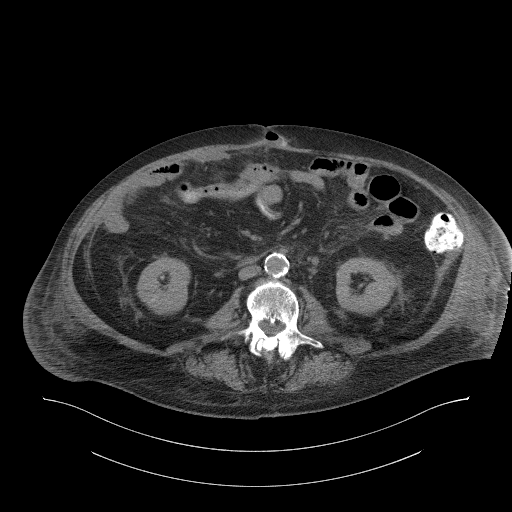
[im 58/85  soft-tissue]
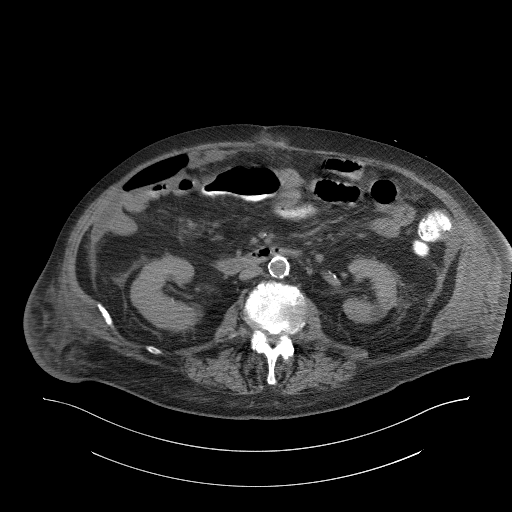
[im 58/85  bone]
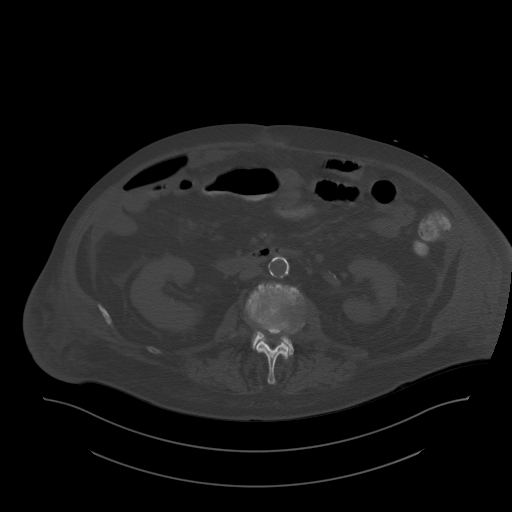
[im 64/85  soft-tissue]
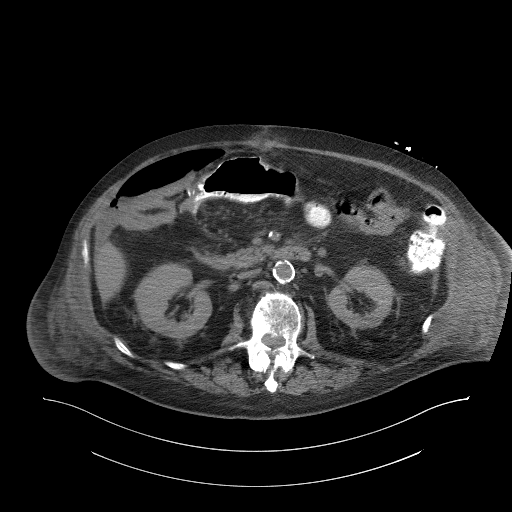
[im 64/85  lung]
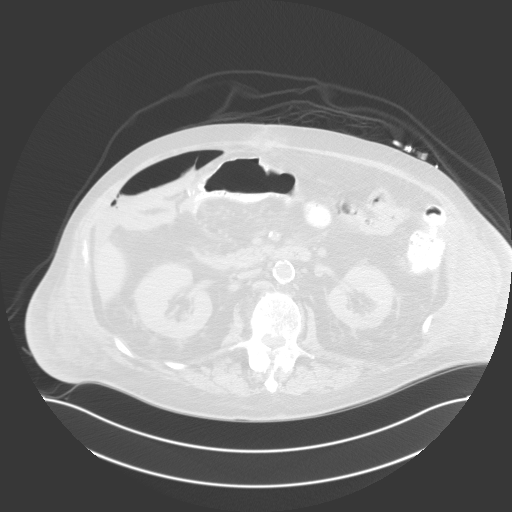
[im 69/85  lung]
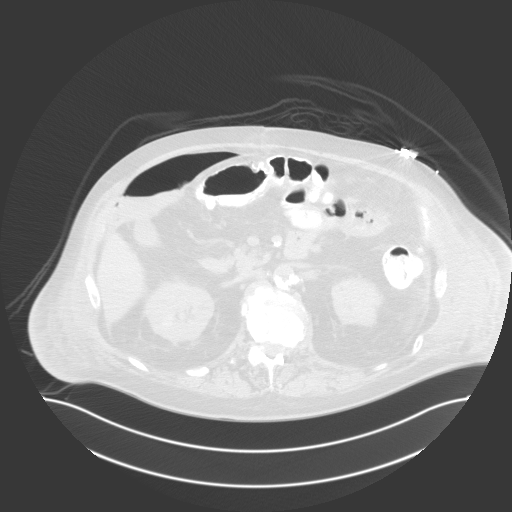
[im 74/85  soft-tissue]
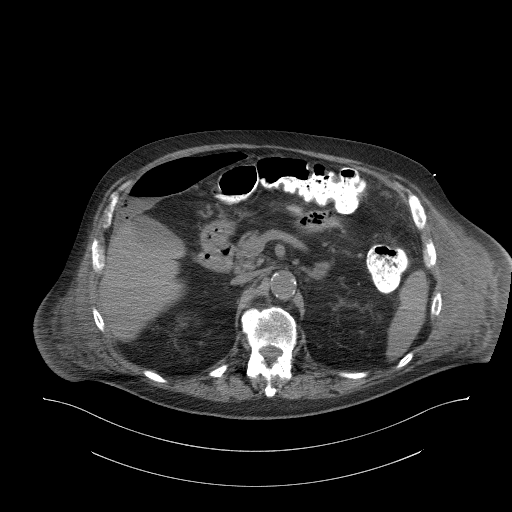
[im 74/85  lung]
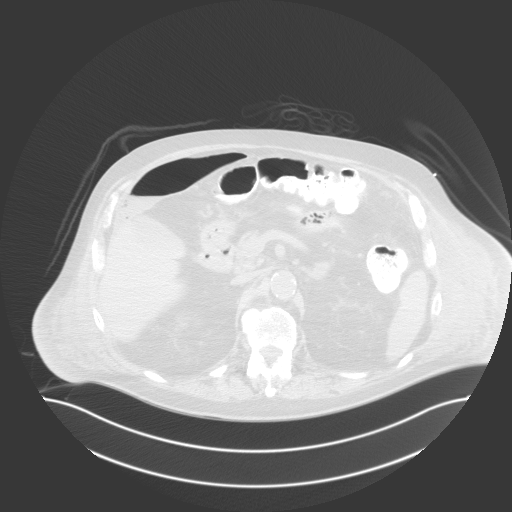
[im 79/85  soft-tissue]
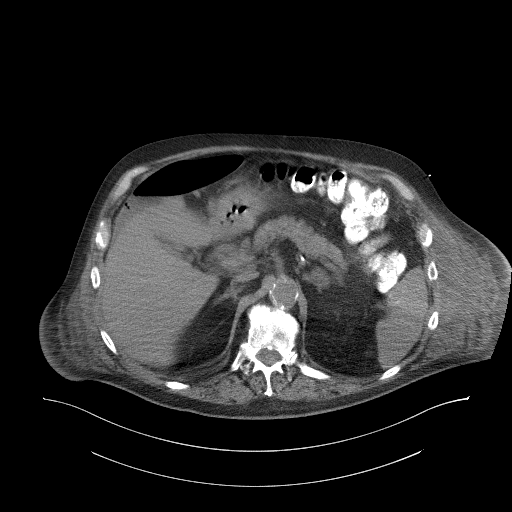
[im 79/85  lung]
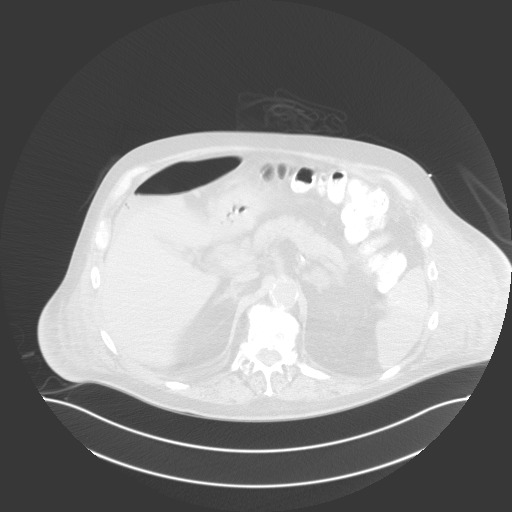

[13 of 32 positions shown; findings below may reference images not displayed]

EXAM:
CT-GUIDED DRAINAGE OF RIGHT LOWER QUADRANT FLUID COLLECTION

MEDICATIONS:
The patient is currently admitted to the hospital and receiving
intravenous antibiotics. The antibiotics were administered within an
appropriate time frame prior to the initiation of the procedure.

ANESTHESIA/SEDATION:
2.0 mg IV Versed 100 mcg IV Fentanyl

Moderate Sedation Time:  22 minutes

The patient was continuously monitored during the procedure by the
interventional radiology nurse under my direct supervision.

COMPLICATIONS:
None
PROCEDURE:
The right lower quadrant was prepped with chlorhexidine in a sterile
fashion, and a sterile drape was applied covering the operative
field. A sterile gown and sterile gloves were used for the
procedure. Local anesthesia was provided with 1% Lidocaine.

Once the patient is prepped and draped in the usual sterile fashion,
1% lidocaine was used for local anesthesia. Using CT guidance,
trocar needle was advanced into the fluid collection of the right
lower quadrant.

Modified Seldinger technique was used to place a 12 French drain.
Drain was sutured in position. Approximately 450 cc of murky yellow
fluid aspirated sample was sent for culture.

Drain was attached to gravity drainage.

Patient tolerated the procedure well and remained hemodynamically
stable throughout.

No complications were encountered and no significant blood loss.
FINDINGS: CT again demonstrates fluid collection the right lower quadrant.

Status post drain placement, there is significant reduction in fluid
after aspiration of 450 cc of fluid.
IMPRESSION: Status post drain placement of right lower quadrant fluid
collection.

## 2019-10-09 ENCOUNTER — Other Ambulatory Visit: Payer: Self-pay | Admitting: Family Medicine

## 2019-10-09 DIAGNOSIS — I10 Essential (primary) hypertension: Secondary | ICD-10-CM

## 2019-10-09 DIAGNOSIS — E782 Mixed hyperlipidemia: Secondary | ICD-10-CM

## 2019-10-09 DIAGNOSIS — I25810 Atherosclerosis of coronary artery bypass graft(s) without angina pectoris: Secondary | ICD-10-CM

## 2019-10-09 DIAGNOSIS — I214 Non-ST elevation (NSTEMI) myocardial infarction: Secondary | ICD-10-CM

## 2019-10-17 ENCOUNTER — Telehealth: Payer: Self-pay | Admitting: Cardiology

## 2019-10-17 DIAGNOSIS — I10 Essential (primary) hypertension: Secondary | ICD-10-CM

## 2019-10-17 DIAGNOSIS — I25810 Atherosclerosis of coronary artery bypass graft(s) without angina pectoris: Secondary | ICD-10-CM

## 2019-10-17 DIAGNOSIS — I214 Non-ST elevation (NSTEMI) myocardial infarction: Secondary | ICD-10-CM

## 2019-10-17 MED ORDER — METOPROLOL SUCCINATE ER 50 MG PO TB24: 50.0000 mg | ORAL_TABLET | Freq: Every day | ORAL | 1 refills | Status: DC

## 2019-10-17 NOTE — Telephone Encounter (Signed)
Called pt and spoke with pt's wife informing her that we can refill pt's medication metoprolol, but pt needs to contact his PCP for a refill on his pravastatin. I advised the wife that if the pt has any other problems, questions or concerns, to give our office a call back. Wife verbalized understanding. Pt medication metoprolol was sent to pt's pharmacy as requested. Confirmation received.

## 2019-10-17 NOTE — Telephone Encounter (Signed)
New Message    *STAT* If patient is at the pharmacy, call can be transferred to refill team.   1. Which medications need to be refilled? (please list name of each medication and dose if known) pravastatin (PRAVACHOL) 40 MG tablet  And metoprolol succinate (TOPROL-XL) 50 MG 24 hr tablet   2. Which pharmacy/location (including street and city if local pharmacy) is medication to be sent to? [facility] ([place] [place] - [address]  3. Do they need a 30 day or 90 day supply? [place]

## 2019-11-01 ENCOUNTER — Other Ambulatory Visit: Payer: Self-pay

## 2019-11-01 ENCOUNTER — Telehealth: Payer: Self-pay

## 2019-11-01 NOTE — Telephone Encounter (Signed)
Pt has given verbal consent for phone visit. Pt has verified medications, allergies, and pharmacy. Pt reports that he has not been taking Pravastatin recently. Pt will have vitals ready prior to visit.    YOUR CARDIOLOGY TEAM HAS ARRANGED FOR AN E-VISIT FOR YOUR APPOINTMENT - PLEASE REVIEW IMPORTANT INFORMATION BELOW SEVERAL DAYS PRIOR TO YOUR APPOINTMENT  Due to the recent COVID-19 pandemic, we are transitioning in-person office visits to tele-medicine visits in an effort to decrease unnecessary exposure to our patients, their families, and staff. These visits are billed to your insurance just like a normal visit is. We also encourage you to sign up for MyChart if you have not already done so. You will need a smartphone if possible. For patients that do not have this, we can still complete the visit using a regular telephone but do prefer a smartphone to enable video when possible. You may have a family member that lives with you that can help. If possible, we also ask that you have a blood pressure cuff and scale at home to measure your blood pressure, heart rate and weight prior to your scheduled appointment. Patients with clinical needs that need an in-person evaluation and testing will still be able to come to the office if absolutely necessary. If you have any questions, feel free to call our office.     YOUR PROVIDER WILL BE USING THE FOLLOWING PLATFORM TO COMPLETE YOUR VISIT: Phone Call . IF USING MYCHART - How to Download the MyChart App to Your SmartPhone   - If Apple, go to [employer] and type in MyChart in the search bar and download the app. If Android, ask patient to go to [employer] and type in [place] in the search bar and download the app. The app is free but as with any other app downloads, your phone may require you to verify saved payment information or Apple/Android password.  - You will need to then log into the app with your MyChart username and password, and select [facility] as your healthcare provider to link the account.  - When it is time for your visit, go to the MyChart app, find appointments, and click Begin Video Visit. Be sure to Select Allow for your device to access the Microphone and Camera for your visit. You will then be connected, and your provider will be with you shortly.  **If you have any issues connecting or need assistance, please contact MyChart service desk [phone])**  **If using a computer, in order to ensure the best quality for your visit, you will need to use either of the following Internet Browsers: Insurance underwriter or [employer]**  . IF USING DOXIMITY or DOXY.ME - The staff will give you instructions on receiving your link to join the meeting the day of your visit.      2-3 DAYS BEFORE YOUR APPOINTMENT  You will receive a telephone call from one of our [place] team members - your caller ID may say "Unknown caller." If this is a video visit, we will walk you through how to get the video launched on your phone. We will remind you check your blood pressure, heart rate and weight prior to your scheduled appointment. If you have an Apple Watch or Kardia, please upload any pertinent ECG strips the day before or morning of your appointment to [place]. Our staff will also make sure you have reviewed the consent and agree to move forward with your scheduled tele-health visit.  THE DAY OF YOUR APPOINTMENT  Approximately 15 minutes prior to your scheduled appointment, you will receive a telephone call from one of [place] team - your caller ID may say "Unknown caller."  Our staff will confirm medications, vital signs for the day and any symptoms you may be experiencing. Please have this information available prior to the time of visit start. It may also be helpful for you to have a pad of paper and pen handy for any instructions given during your visit. They will also walk you through joining the smartphone meeting if  this is a video visit.    CONSENT FOR TELE-HEALTH VISIT - PLEASE REVIEW  I hereby voluntarily request, consent and authorize [facility] and its employed or contracted physicians, physician assistants, nurse practitioners or other licensed health care professionals (the Practitioner), to provide me with telemedicine health care services (the "Services") as deemed necessary by the treating Practitioner. I acknowledge and consent to receive the Services by the Practitioner via telemedicine. I understand that the telemedicine visit will involve communicating with the Practitioner through live audiovisual communication technology and the disclosure of certain medical information by electronic transmission. I acknowledge that I have been given the opportunity to request an in-person assessment or other available alternative prior to the telemedicine visit and am voluntarily participating in the telemedicine visit.  I understand that I have the right to withhold or withdraw my consent to the use of telemedicine in the course of my care at any time, without affecting my right to future care or treatment, and that the Practitioner or I may terminate the telemedicine visit at any time. I understand that I have the right to inspect all information obtained and/or recorded in the course of the telemedicine visit and may receive copies of available information for a reasonable fee.  I understand that some of the potential risks of receiving the Services via telemedicine include:  Marland Kitchen Delay or interruption in medical evaluation due to technological equipment failure or disruption; . Information transmitted may not be sufficient (e.g. poor resolution of images) to allow for appropriate medical decision making by the Practitioner; and/or  . In rare instances, security protocols could fail, causing a breach of personal health information.  Furthermore, I acknowledge that it is my responsibility to provide information  about my medical history, conditions and care that is complete and accurate to the best of my ability. I acknowledge that Practitioner's advice, recommendations, and/or decision may be based on factors not within their control, such as incomplete or inaccurate data provided by me or distortions of diagnostic images or specimens that may result from electronic transmissions. I understand that the practice of medicine is not an exact science and that Practitioner makes no warranties or guarantees regarding treatment outcomes. I acknowledge that I will receive a copy of this consent concurrently upon execution via email to the email address I last provided but may also request a printed copy by calling the office of [place].    I understand that my insurance will be billed for this visit.   I have read or had this consent read to me. . I understand the contents of this consent, which adequately explains the benefits and risks of the Services being provided via telemedicine.  . I have been provided ample opportunity to ask questions regarding this consent and the Services and have had my questions answered to my satisfaction. . I give my informed consent for the services to be provided through the  use of telemedicine in my medical care  By participating in this telemedicine visit I agree to the above.

## 2019-11-02 ENCOUNTER — Telehealth (INDEPENDENT_AMBULATORY_CARE_PROVIDER_SITE_OTHER): Payer: Medicare HMO | Admitting: Cardiology

## 2019-11-02 ENCOUNTER — Other Ambulatory Visit: Payer: Self-pay

## 2019-11-02 ENCOUNTER — Encounter: Payer: Self-pay | Admitting: Cardiology

## 2019-11-02 DIAGNOSIS — E782 Mixed hyperlipidemia: Secondary | ICD-10-CM | POA: Diagnosis not present

## 2019-11-02 DIAGNOSIS — I480 Paroxysmal atrial fibrillation: Secondary | ICD-10-CM

## 2019-11-02 DIAGNOSIS — I10 Essential (primary) hypertension: Secondary | ICD-10-CM

## 2019-11-02 DIAGNOSIS — I25118 Atherosclerotic heart disease of native coronary artery with other forms of angina pectoris: Secondary | ICD-10-CM

## 2019-11-02 DIAGNOSIS — Z7289 Other problems related to lifestyle: Secondary | ICD-10-CM

## 2019-11-02 DIAGNOSIS — I25810 Atherosclerosis of coronary artery bypass graft(s) without angina pectoris: Secondary | ICD-10-CM

## 2019-11-02 DIAGNOSIS — I214 Non-ST elevation (NSTEMI) myocardial infarction: Secondary | ICD-10-CM

## 2019-11-02 DIAGNOSIS — Z789 Other specified health status: Secondary | ICD-10-CM

## 2019-11-02 MED ORDER — METOPROLOL SUCCINATE ER 50 MG PO TB24: 50.0000 mg | ORAL_TABLET | Freq: Every day | ORAL | 3 refills | Status: DC

## 2019-11-02 MED ORDER — PRAVASTATIN SODIUM 40 MG PO TABS: ORAL_TABLET | ORAL | 3 refills | Status: DC

## 2019-11-02 NOTE — Progress Notes (Signed)
Virtual Visit via Telephone Note   This visit type was conducted due to national recommendations for restrictions regarding the COVID-19 Pandemic (e.g. social distancing) in an effort to limit this patient's exposure and mitigate transmission in our community.  Due to his co-morbid illnesses, this patient is at least at moderate risk for complications without adequate follow up.  This format is felt to be most appropriate for this patient at this time.  The patient did not have access to video technology/had technical difficulties with video requiring transitioning to audio format only (telephone).  All issues noted in this document were discussed and addressed.  No physical exam could be performed with this format.  Please refer to the patient's chart for his  consent to telehealth for [facility].   Date:  11/02/2019   ID:  Vincent Mcclure, DOB [birth year], MRN [id number]  Patient Location: Home Provider Location: Home  PCP:  [name], DO  Cardiologist:  Dr Meda Coffee  Evaluation Performed:  Follow-Up Visit  Chief Complaint:  Chest pain, 6 months follow up  History of Present Illness:    Vincent Mcclure is a 86 y.o. male with history of CAD, hypertension, hyperlipidemia, gastric ulcers with big GI bleed in 2016 secondary to alcohol and previous tobacco abuse.  He also has history of colon cancer s/p hemicolectomy in 4944 with complications and prolonged hospitalizations for 2 months..  He was admitted with a non-STEMI in 2018 and found to have multivessel CAD and preserved LV function on cardiac cath. He underwent CABG x3 on Feb 15, 2017.  He had postoperative atrial fibrillation that was treated with amiodarone and he converted to normal sinus rhythm.  CHA2DS2 VASc score =4 but HAS-BLED score was 5 so no anticoagulation at that time per Dr. Sallyanne Kuster.  Bleed risk exceeds potential stroke reduction benefit.  Patient was recently seen by Dr. [name] for colon cancer follow-up. A staff  message was sent to our office requesting an appointment with cardiology.  Per Dr.Feng's message, she reported that the patient was doing well from a cancer standpoint however he had endorsed to her at that visit that he had had an episodes of chest pain. Stress test was recommended.  This was offered to the patient however he declined.  He wanted to further discuss his symptoms at office visit prior to proceeding with the study.  He was seen by Ellen Henri in July 2020 for episodes of atypical chest pain, stress test was held off at that time. The patient states that lately he has had several episodes of chest pains not necessarily related to exertion, they can come at rest while he is sitting and watching TV in the last up to 20 minutes.  He has taken nitroglycerin with improvement on some occasions.  Last episode of chest pain approximately 4 weeks ago.  He has stable dyspnea on moderate exertion, he tries to be active around the house.  He denies any dizziness or syncope.  Cardiac Studies   2D echocardiogram 06/03/2018 Study Conclusions  - Left ventricle: The cavity size was normal. Wall thickness was   increased in a pattern of mild LVH. Systolic function was normal.   The estimated ejection fraction was in the range of 55% to 60%.   Wall motion was normal; there were no regional wall motion   abnormalities. Doppler parameters are consistent with abnormal   left ventricular relaxation (grade 1 diastolic dysfunction). - Aortic valve: Mildly calcified annulus. Trileaflet; mildly   thickened  leaflets. Valve area (VTI): 4.6 cm^2. Valve area   (Vmax): 4.22 cm^2. - Right atrium: There is a 1.2 by 1.6 cm echodense mass in the   right atrium, overall poorly visualized but best seen in the   apical 4 chamber views. The subcostal views are limited . When   reviewing the 02/15/17 TEE this area is also poorly visualized, but   does appear to be due to a prominent eustachian valve, a benign    finding. - Pulmonary arteries: Systolic pressure was mildly increased. PA   peak pressure: 37 mm Hg (S). - Systemic veins: IVC is small suggesting low RA pressure and   hypovolemia.  The patient does not have symptoms concerning for COVID-19 infection (fever, chills, cough, or new shortness of breath).    Past Medical History:  Diagnosis Date  . Alcohol abuse   . Anemia   . Arthritis    hands  . Blood transfusion without reported diagnosis   . Cancer [facility])    colon cancer  . Chronic low back pain 10/14/2011  . Colon polyps    hyperplastic (2004, 2010) and adenomatous (1990).    . COLONIC POLYPS, HX OF 03/05/2008  . Esophageal stricture    hx of  . Gastric ulcer   . GERD 03/05/2008  . GERD (gastroesophageal reflux disease) 1994   associated peptic strictures  . HYPERLIPIDEMIA 03/05/2008  . HYPERTENSION 03/05/2008  . Hypertension   . IBS 03/05/2008  . Impaired glucose tolerance 10/12/2011  . Iron deficiency anemia   . LEG CRAMPS 09/02/2010  . Myocardial infarction [facility])    May 2018  . PAD (peripheral artery disease) ([place]) 10/14/2011  . PAD (peripheral artery disease) ([place]) 2013  . PERIPHERAL EDEMA 09/02/2010  . PERIPHERAL NEUROPATHY 03/05/2008  . Prostatitis    hx of  . VARICOSE VEINS, LOWER EXTREMITIES 06/09/2009   Past Surgical History:  Procedure Laterality Date  . CATARACT EXTRACTION  bilat  . COLECTOMY    . COLONOSCOPY    . CORONARY ARTERY BYPASS GRAFT N/A 02/15/2017   Procedure: CORONARY ARTERY BYPASS GRAFTING times three  with left internal mammary harvest and endoscopic harvest of Right SVG. Grafts of LIMA to  LAD, SVG to Distal Circ, and to First Diag.;  Surgeon: Grace Isaac, MD;  Location: [place];  Service: Open Heart Surgery;  Laterality: N/A;  . ESOPHAGOGASTRODUODENOSCOPY N/A 11/14/2014   Procedure: ESOPHAGOGASTRODUODENOSCOPY (EGD);  Surgeon: Jerene Bears, MD;  Location[facility] ENDOSCOPY;  Service: Endoscopy;  Laterality: N/A;  . IR RADIOLOGIST EVAL & MGMT  07/04/2018    . IR RADIOLOGIST EVAL & MGMT  07/18/2018  . LAPAROTOMY N/A 05/19/2018   Procedure: EXPLORATORY LAPAROTOMY RIGHT COLECTOMY;  Surgeon: Fanny Skates, MD;  Location: [facility] OR[facility];  Service: General;  Laterality: N/A;  . LEFT HEART CATH AND CORONARY ANGIOGRAPHY N/A 02/14/2017   Procedure: Left Heart Cath and Coronary Angiography;  Surgeon: Belva Crome, MD;  Location: [place] CV LAB;  Service: Cardiovascular;  Laterality: N/A;  . LUMBAR SPINE SURGERY  11/2008   Dr Joya Salm  . TEE WITHOUT CARDIOVERSION N/A 02/15/2017   Procedure: TRANSESOPHAGEAL ECHOCARDIOGRAM (TEE);  Surgeon: Grace Isaac, MD;  Location: [place];  Service: Open Heart Surgery;  Laterality: N/A;  . UPPER GASTROINTESTINAL ENDOSCOPY       No outpatient medications have been marked as taking for the 11/02/19 encounter (Telemedicine) with Dorothy Spark, MD.     Allergies:   No known allergies   Social History  Tobacco Use  . Smoking status: Former Smoker    Packs/day: 1.25    Years: 50.00    Pack years: 62.50    Types: Cigarettes    Quit date: 11/11/2008    Years since quitting: 10.9  . Smokeless tobacco: Former Systems developer    Types: Chew  Substance Use Topics  . Alcohol use: Yes    Alcohol/week: 6.0 standard drinks    Types: 2 Glasses of wine, 4 Cans of beer per week    Comment: vodka  . Drug use: No     Family Hx: The patient's family history includes Cancer in his mother; Diabetes in his father and sister; Heart disease in his father; Hypertension in his father; Stroke in his father. There is no history of Stomach cancer, Pancreatic cancer, Colon cancer, Esophageal cancer, or Rectal cancer.  ROS:   Please see the history of present illness.    All other systems reviewed and are negative.  Labs/Other Tests and Data Reviewed:    EKG:  No ECG reviewed.  Recent Labs: 04/24/2019: TSH 0.969 08/20/2019: ALT 14; BUN 13; Creatinine 0.99; Hemoglobin 14.4; Platelet Count 180; Potassium 4.3; Sodium 138   Recent Lipid  Panel Lab Results  Component Value Date/Time   CHOL 133 04/24/2019 09:17 AM   TRIG 169 (H) 04/24/2019 09:17 AM   HDL 47 04/24/2019 09:17 AM   CHOLHDL 2.8 04/24/2019 09:17 AM   CHOLHDL 3.4 02/12/2017 06:57 AM   LDLCALC 52 04/24/2019 09:17 AM   LDLDIRECT 64 04/24/2019 09:17 AM   LDLDIRECT 133.2 10/14/2011 11:06 AM    Wt Readings from Last 3 Encounters:  11/02/19 221 lb (100.2 kg)  08/20/19 228 lb 1.6 oz (103.5 kg)  08/16/19 232 lb 1.6 oz (105.3 kg)     Objective:    Vital Signs:  BP (!) 153/87   Pulse 86   Wt 221 lb (100.2 kg)   BMI 30.82 kg/m    VITAL SIGNS:  reviewed  ASSESSMENT & PLAN:    1. CAD status post CABG 02/15/17.  The patient is having episodes of chest pain, we will bring him into obtain an EKG and potentially obtain a stress test  Continue toprol XL, pravastatin, aspirin.  His most recent hemoglobin in November 2020 was 14.4.  2. PAF postop maintaining normal sinus rhythm, now off amiodarone.  Denies any palpitations.  3. Essential hypertension - slightly elevated today, will recheck at the next visit.  4. Mixed hyperlipidemia - continue pravastatin, check CPK, he was taking pravastatin even prior to CABG   PAF postop maintaining normal sinus rhythm, now off amiodarone.  3. Essential hypertension - controlled  4. Mixed hyperlipidemia - continue pravastatin, normal LFTs in November 2020.  COVID-19 Education: The signs and symptoms of COVID-19 were discussed with the patient and how to seek care for testing (follow up with PCP or arrange E-visit).  The importance of social distancing was discussed today.  Time:   Today, I have spent 25 minutes with the patient with telehealth technology discussing the above problems.     Medication Adjustments/Labs and Tests Ordered: Current medicines are reviewed at length with the patient today.  Concerns regarding medicines are outlined above.   Tests Ordered: No orders of the defined types were placed in this  encounter.   Medication Changes: No orders of the defined types were placed in this encounter.   Follow Up:  In Person in 2 week(s)  Signed, Ena Dawley, MD  11/02/2019 11:11 AM  [facility]

## 2019-11-02 NOTE — Patient Instructions (Signed)
Medication Instructions:   Your physician recommends that you continue on your current medications as directed. Please refer to the Current Medication list given to you today.  *If you need a refill on your cardiac medications before your next appointment, please call your pharmacy*    Follow-Up: At [facility], you and your health needs are our priority.  As part of our continuing mission to provide you with exceptional heart care, we have created designated Provider Care Teams.  These Care Teams include your primary Cardiologist (physician) and Advanced Practice Providers (APPs -  Physician Assistants and Nurse Practitioners) who all work together to provide you with the care you need, when you need it.  Your next appointment:   November 19, 2019 AT 8:00 AM WITH DR. Meda Coffee HERE IN THE OFFICE   The format for your next appointment:   In [place] AN EKG  Provider:   Ena Dawley, MD

## 2019-11-18 NOTE — Progress Notes (Signed)
11/18/2019 Vincent Mcclure   1931-11-10  [id number]  Primary Physician [name], DO Primary Cardiologist: Dr. Meda Coffee  Electrophysiologist: None   Reason for Visit/CC: Chest Pain  HPI:  Vincent Mcclure is a 86 y.o. male Vincent Mcclure is a 86 y.o. male with history of CAD, hypertension, hyperlipidemia, gastric ulcers with big GI bleed in 2016 secondary to alcohol and previous tobacco abuse.  He also has history of colon cancer s/p hemicolectomy in 0539 with complications and prolonged hospitalizations for 2 months..  He was admitted with a non-STEMI in 2018 and found to have multivessel CAD and preserved LV function on cardiac cath. He underwent CABG x3 on Feb 15, 2017.  He had postoperative atrial fibrillation that was treated with amiodarone and he converted to normal sinus rhythm.  CHA2DS2 VASc score =4 but HAS-BLED score was 5 so no anticoagulation at that time per Dr. Sallyanne Kuster.  Bleed risk exceeds potential stroke reduction benefit.  Patient was recently seen by Dr. [name] for colon cancer follow-up. A staff message was sent to our office requesting an appointment with cardiology.  Per Dr.Feng's message, she reported that the patient was doing well from a cancer standpoint however he had endorsed to her at that visit that he had had an episodes of chest pain. Stress test was recommended.  This was offered to the patient however he declined.  He wanted to further discuss his symptoms at office visit prior to proceeding with the study.  He was seen by Ellen Henri in July 2020 for episodes of atypical chest pain, stress test was held off at that time.  11/02/2019 - The patient states that lately he has had several episodes of chest pains not necessarily related to exertion, they can come at rest while he is sitting and watching TV in the last up to 20 minutes.  He has taken nitroglycerin with improvement on some occasions.  Last episode of chest pain approximately 4 weeks ago.  He has  stable dyspnea on moderate exertion, he tries to be active around the house.  He denies any dizziness or syncope.  11/19/2019 - 2 weeks follow up, the patient is coming for an in-person visit to obtain ECG and possibly stress test if is symptoms are persistent. He states that he has had 3 episodes of chest pain in the last 6 months, they are not related to exertion and they resolve with nitroglycerin.  She denies any orthopnea proximal nocturnal dyspnea no lower extremity edema, no dizziness or falls.  Cardiac Studies   2D echocardiogram 06/03/2018 Study Conclusions  - Left ventricle: The cavity size was normal. Wall thickness was   increased in a pattern of mild LVH. Systolic function was normal.   The estimated ejection fraction was in the range of 55% to 60%.   Wall motion was normal; there were no regional wall motion   abnormalities. Doppler parameters are consistent with abnormal   left ventricular relaxation (grade 1 diastolic dysfunction). - Aortic valve: Mildly calcified annulus. Trileaflet; mildly   thickened leaflets. Valve area (VTI): 4.6 cm^2. Valve area   (Vmax): 4.22 cm^2. - Right atrium: There is a 1.2 by 1.6 cm echodense mass in the   right atrium, overall poorly visualized but best seen in the   apical 4 chamber views. The subcostal views are limited . When   reviewing the 02/15/17 TEE this area is also poorly visualized, but   does appear to be due to a prominent eustachian valve,  a benign   finding. - Pulmonary arteries: Systolic pressure was mildly increased. PA   peak pressure: 37 mm Hg (S). - Systemic veins: IVC is small suggesting low RA pressure and   hypovolemia.  No outpatient medications have been marked as taking for the 11/19/19 encounter (Appointment) with Dorothy Spark, MD.   Allergies  Allergen Reactions  . No Known Allergies    Past Medical History:  Diagnosis Date  . Alcohol abuse   . Anemia   . Arthritis    hands  . Blood transfusion without  reported diagnosis   . Cancer [facility])    colon cancer  . Chronic low back pain 10/14/2011  . Colon polyps    hyperplastic (2004, 2010) and adenomatous (1990).    . COLONIC POLYPS, HX OF 03/05/2008  . Esophageal stricture    hx of  . Gastric ulcer   . GERD 03/05/2008  . GERD (gastroesophageal reflux disease) 1994   associated peptic strictures  . HYPERLIPIDEMIA 03/05/2008  . HYPERTENSION 03/05/2008  . Hypertension   . IBS 03/05/2008  . Impaired glucose tolerance 10/12/2011  . Iron deficiency anemia   . LEG CRAMPS 09/02/2010  . Myocardial infarction [facility])    May 2018  . PAD (peripheral artery disease) ([place]) 10/14/2011  . PAD (peripheral artery disease) ([place]) 2013  . PERIPHERAL EDEMA 09/02/2010  . PERIPHERAL NEUROPATHY 03/05/2008  . Prostatitis    hx of  . VARICOSE VEINS, LOWER EXTREMITIES 06/09/2009   Family History  Problem Relation Age of Onset  . Cancer Mother        Brain Cancer  . Diabetes Father   . Heart disease Father        CAD  . Hypertension Father   . Stroke Father   . Diabetes Sister   . Stomach cancer Neg Hx   . Pancreatic cancer Neg Hx   . Colon cancer Neg Hx   . Esophageal cancer Neg Hx   . Rectal cancer Neg Hx    Past Surgical History:  Procedure Laterality Date  . CATARACT EXTRACTION  bilat  . COLECTOMY    . COLONOSCOPY    . CORONARY ARTERY BYPASS GRAFT N/A 02/15/2017   Procedure: CORONARY ARTERY BYPASS GRAFTING times three  with left internal mammary harvest and endoscopic harvest of Right SVG. Grafts of LIMA to  LAD, SVG to Distal Circ, and to First Diag.;  Surgeon: Grace Isaac, MD;  Location: [place];  Service: Open Heart Surgery;  Laterality: N/A;  . ESOPHAGOGASTRODUODENOSCOPY N/A 11/14/2014   Procedure: ESOPHAGOGASTRODUODENOSCOPY (EGD);  Surgeon: Jerene Bears, MD;  Location: [facility] ENDOSCOPY;  Service: Endoscopy;  Laterality: N/A;  . IR RADIOLOGIST EVAL & MGMT  07/04/2018  . IR RADIOLOGIST EVAL & MGMT  07/18/2018  . LAPAROTOMY N/A 05/19/2018   Procedure:  EXPLORATORY LAPAROTOMY RIGHT COLECTOMY;  Surgeon: Fanny Skates, MD;  Location: [facility] ORS;  Service: General;  Laterality: N/A;  . LEFT HEART CATH AND CORONARY ANGIOGRAPHY N/A 02/14/2017   Procedure: Left Heart Cath and Coronary Angiography;  Surgeon: Belva Crome, MD;  Location: [place] CV LAB;  Service: Cardiovascular;  Laterality: N/A;  . LUMBAR SPINE SURGERY  11/2008   Dr Joya Salm  . TEE WITHOUT CARDIOVERSION N/A 02/15/2017   Procedure: TRANSESOPHAGEAL ECHOCARDIOGRAM (TEE);  Surgeon: Grace Isaac, MD;  Location: [place];  Service: Open Heart Surgery;  Laterality: N/A;  . UPPER GASTROINTESTINAL ENDOSCOPY     Social History   Socioeconomic History  . Marital status: Married  Spouse name: Not on file  . Number of children: 4  . Years of education: Not on file  . Highest education level: Not on file  Occupational History  . Occupation: Retired Engineer, maintenance  Tobacco Use  . Smoking status: Former Smoker    Packs/day: 1.25    Years: 50.00    Pack years: 62.50    Types: Cigarettes    Quit date: 11/11/2008    Years since quitting: 11.0  . Smokeless tobacco: Former Systems developer    Types: Chew  Substance and Sexual Activity  . Alcohol use: Yes    Alcohol/week: 6.0 standard drinks    Types: 2 Glasses of wine, 4 Cans of beer per week    Comment: vodka  . Drug use: No  . Sexual activity: Never  Other Topics Concern  . Not on file  Social History Narrative   ** Merged History Encounter **       4 sons   Social Determinants of Health   Financial Resource Strain:   . Difficulty of Paying Living Expenses: Not on file  Food Insecurity:   . Worried About Charity fundraiser in the Last Year: Not on file  . Ran Out of Food in the Last Year: Not on file  Transportation Needs:   . Lack of Transportation (Medical): Not on file  . Lack of Transportation (Non-Medical): Not on file  Physical Activity:   . Days of Exercise per Week: Not on file  . Minutes of Exercise per Session: Not on  file  Stress:   . Feeling of Stress : Not on file  Social Connections:   . Frequency of Communication with Friends and Family: Not on file  . Frequency of Social Gatherings with Friends and Family: Not on file  . Attends Religious Services: Not on file  . Active Member of Clubs or Organizations: Not on file  . Attends Archivist Meetings: Not on file  . Marital Status: Not on file  Intimate Partner Violence:   . Fear of Current or Ex-Partner: Not on file  . Emotionally Abused: Not on file  . Physically Abused: Not on file  . Sexually Abused: Not on file     Lipid Panel     Component Value Date/Time   CHOL 133 04/24/2019 0917   TRIG 169 (H) 04/24/2019 0917   HDL 47 04/24/2019 0917   CHOLHDL 2.8 04/24/2019 0917   CHOLHDL 3.4 02/12/2017 0657   VLDL 17 02/12/2017 0657   LDLCALC 52 04/24/2019 0917   LDLDIRECT 64 04/24/2019 0917   LDLDIRECT 133.2 10/14/2011 1106    Review of Systems: General: negative for chills, fever, night sweats or weight changes.  Cardiovascular: negative for chest pain, dyspnea on exertion, edema, orthopnea, palpitations, paroxysmal nocturnal dyspnea or shortness of breath Dermatological: negative for rash Respiratory: negative for cough or wheezing Urologic: negative for hematuria Abdominal: negative for nausea, vomiting, diarrhea, bright red blood per rectum, melena, or hematemesis Neurologic: negative for visual changes, syncope, or dizziness All other systems reviewed and are otherwise negative except as noted above.   Physical Exam:  There were no vitals taken for this visit.  General appearance: alert, cooperative and no distress Neck: no carotid bruit and no JVD Lungs: clear to auscultation bilaterally Heart: regular rate and rhythm, S1, S2 normal, no murmur, click, rub or gallop Extremities: extremities normal, atraumatic, no cyanosis or edema Pulses: 2+ and symmetric Skin: Skin color, texture, turgor normal. No rashes or  lesions Neurologic:  Grossly normal  EKG sinus tachycardia 104 bpm, otherwise normal EKG unchanged from prior other than increased heart rate, personally reviewed    ASSESSMENT AND PLAN:   1. CAD status post CABG 02/15/17.  The patient is having episodes of chest pain, those are quite rare, 3 in 6 months, he is EKG looks normal, will continue the same management, he is advised to call us if he has more episodes of chest pain will consider stress test at that point. Continue toprol XL, pravastatin, aspirin.    2. PAF postop maintaining normal sinus rhythm, now off amiodarone.  Denies any palpitations.  3. Essential hypertension - slightly elevated today however controlled at home.  4. Mixed hyperlipidemia - continue pravastatin, he is tolerating it well.  5. Adenocarcinoma of right colon, invasive adenocarcinoma, stage IIIB (pT4a, pN1, cM0), MSS, followed by Dr. [name] -Diagnosed in 05/2018. Treated with hemicolectomy. HereceivedadjuvantXeloda 2000 mg BID one week on, one week offfrom10/21/2019 to 01/29/2019, he overall tolerated well. Continue cancer surveillance.  Follow-up in 4 months  Ena Dawley , MD [facility] 11/18/2019 3:12 PM

## 2019-11-19 ENCOUNTER — Ambulatory Visit: Payer: Medicare HMO | Admitting: Cardiology

## 2019-11-19 ENCOUNTER — Encounter: Payer: Self-pay | Admitting: Cardiology

## 2019-11-19 ENCOUNTER — Other Ambulatory Visit: Payer: Self-pay

## 2019-11-19 VITALS — BP 152/88 | HR 110 | Ht 71.0 in | Wt 232.4 lb

## 2019-11-19 DIAGNOSIS — I10 Essential (primary) hypertension: Secondary | ICD-10-CM

## 2019-11-19 DIAGNOSIS — E782 Mixed hyperlipidemia: Secondary | ICD-10-CM

## 2019-11-19 DIAGNOSIS — I25810 Atherosclerosis of coronary artery bypass graft(s) without angina pectoris: Secondary | ICD-10-CM | POA: Diagnosis not present

## 2019-11-19 NOTE — Patient Instructions (Signed)
Medication Instructions:   Your physician recommends that you continue on your current medications as directed. Please refer to the Current Medication list given to you today.  *If you need a refill on your cardiac medications before your next appointment, please call your pharmacy*    Follow-Up: At [facility], you and your health needs are our priority.  As part of our continuing mission to provide you with exceptional heart care, we have created designated Provider Care Teams.  These Care Teams include your primary Cardiologist (physician) and Advanced Practice Providers (APPs -  Physician Assistants and Nurse Practitioners) who all work together to provide you with the care you need, when you need it.  Your next appointment:   4 month(s) with Dr. Meda Coffee on March 19, 2020 at 8:40 am in the office  The format for your next appointment:   In Person  Provider:   Ena Dawley, MD

## 2019-11-30 NOTE — Progress Notes (Signed)
[place]   [phone] Fax:([phone]) [phone]   Clinic Follow up Note   Patient Care Team: [name], DO as PCP - General (Family Medicine) Pyrtle, Lajuan Lines, MD as Consulting Physician (Gastroenterology) Allyn Kenner, MD as Consulting Physician (Dermatology) Dorothy Spark, MD as Consulting Physician (Cardiology) Levin Erp, [place] as Physician Assistant (Gastroenterology) Murrell Converse as Physician Assistant (Cardiology)  Date of Service:  12/05/2019  CHIEF COMPLAINT: F/u on stage III colon cancer  SUMMARY OF ONCOLOGIC HISTORY: Oncology History Overview Note  Cancer Staging Cancer of ascending colon pT4apN1 s/p right colectomy 05/19/2018 Staging form: Colon and Rectum, AJCC 8th Edition - Pathologic stage from 05/19/2018: Stage IIIB (pT4a, pN1, cM0) - Signed by Truitt Merle, MD on 05/24/2018     Cancer of ascending colon pT4apN1 s/p right colectomy 05/19/2018  05/18/2018 Imaging   05/18/2018 CT AP IMPRESSION: 1. There appears to be a nearly obstructing lesion in the region of the hepatic flexure of the colon which is highly concerning for primary colonic neoplasm. Slight haziness of the surrounding soft tissues may be indicative of early local invasion. This appears partially obstructive as evidenced by dilatation of more proximal aspects of the small bowel and colon. 2. Left adrenal lesion is stable compared to prior studies, previously characterized as an adenoma. 3. Aortic atherosclerosis, in addition to left main and 3 vessel coronary artery disease. Status post median sternotomy for CABG. 4. Additional incidental findings, as above.  Aortic Atherosclerosis (ICD10-I70.0).   05/18/2018 - 05/30/2018 Hospital Admission   Admit date: 05/18/2018 Admission diagnosis: Colon Cancer Discharged on: 05/30/2018   05/19/2018 Cancer Staging   Staging form: Colon and Rectum, AJCC 8th Edition - Pathologic stage from 05/19/2018: Stage IIIB (pT4a, pN1,  cM0) - Signed by Truitt Merle, MD on 05/24/2018    05/19/2018 Pathology Results   05/19/2018 Surgical Pathology Diagnosis Colon, segmental resection for tumor, right - INVASIVE COLORECTAL ADENOCARCINOMA, 5 CM. - CARCINOMA FOCALLY LESS THAN 0.1 CM FROM SEROSAL SURFACE. - MARGINS NOT INVOLVED. - METASTATIC CARCINOMA IN ONE OF EIGHT LYMPH NODES (1/9). - BENIGN APPENDIX.   05/19/2018 Surgery   EXPLORATORY LAPAROTOMY with right colectomy with Fanny Skates, MD   05/24/2018 Initial Diagnosis   Cancer of right colon ([place])   06/12/2018 Imaging    06/12/2018 CT CAP IMPRESSION: 1. Evidence all bowel perforation/anastomosed breakdown with large volume intraperitoneal free air fluid in the RIGHT upper quadrant adjacent and above the liver. 2. Large loculated fluid collection in the RIGHT anterior pelvis extending along the LEFT pericolic gutter consists with PERITONEAL ABSCESS. 3. Of note, no large volume intraperitoneal free fluid. Oral contrast passed through the RIGHT colon anastomosis without evidence of leak. Findings could indicate a prior anastomotic breakdown which has healed in the interval with subsequent abscess formation from the prior leak / breakdown.   06/17/2018 Procedure   06/17/2018 US Thoracentesis IMPRESSION: Successful ultrasound guided right thoracentesis yielding 1 L of pleural fluid. No pneumothorax on post-procedure chest x-ray.  No malignancy on pathology   06/20/2018 Imaging   06/20/2018 CT AP IMPRESSION: 1. Decompression of right upper quadrant air and fluid collection after percutaneous catheter drainage. There remains a tiny rim of fluid as well as small extraluminal gas bubbles in this region suspicious for continued leak from bowel. 2. Further decrease in size of residual abscess fluid collection in the pelvis after percutaneous catheter drainage. There remains some fluid around the drainage catheter in the right pelvis.   07/04/2018 Imaging  07/04/2018 CT  AP IMPRESSION: 1. Further decompression of right perihepatic abscess with resolution of extraluminal air. 2. Relatively stable volume of fluid surrounding the right pelvic drainage catheter.   07/05/2018 Imaging   07/05/2018 CT AP IMPRESSION: 1. No change compared to the CT scan performed 9 hours earlier. 2. Anterior right perihepatic space collection is decompressed by indwelling percutaneous drain, with no residual measurable collection in this location. 3. Thick walled right lower quadrant collection with indwelling percutaneous drain is stable. No new focal fluid collections. 4. Stable postsurgical changes from right hemicolectomy with no evidence of bowel obstruction. Stable reactive wall thickening in the sigmoid colon adjacent to the right lower quadrant collection. 5. Stable small to moderate dependent right pleural effusion. 6. Stable left adrenal adenoma. 7.  Aortic Atherosclerosis (ICD10-I70.0).   07/31/2018 - 01/21/2019 Chemotherapy   Adjuvant Xeloda 2000mg  bid, 7 days on, 7 days off.  Reduced to 2000mg  in the AM and 1500mg  in the PM due to diarrhea. Completed on 01/21/19    04/12/2019 Imaging   CT CAP W contrast 04/12/19  IMPRESSION: 1. Status post right hemicolectomy. 2. No definite metastatic disease in the chest, abdomen, or pelvis. 7 mm right lower lobe pulmonary nodule, obscured by atelectasis on previous chest CT and not included on multiple prior abdomen / pelvis CTs. Close follow-up recommended to ensure stability. 3.  Aortic Atherosclerois (ICD10-170.0)   09/04/2019 Imaging   CT AP W Contrast I MPRESSION: 1. No evidence of local colon cancer recurrence at the RIGHT hemicolectomy site. 2. No evidence of metastatic disease in the abdomen pelvis. 3. Mild nodularity at the LEFT lung base is favored benign. Recommend attention on routine surveillance 4. Aortic Atherosclerosis (ICD10-I70.0).      CURRENT THERAPY:  Surveillance    INTERVAL HISTORY:   Vincent Mcclure is here for a follow up. He presents to the clinic alone. He notes he does not have hearing aids in. He denies abdominal pain but has bloating. He notes chest pain occasional and will take Nitroglycerin as needed. He has been having left testicular shrinking and a mass on his right testicle. He denies testicular pain. He does not have urologist. His BP is elevated at 150/91. He notes 1-2 times a day he coughs up phlegm. He denies smoking.     REVIEW OF SYSTEMS:   Constitutional: Denies fevers, chills or abnormal weight loss Eyes: Denies blurriness of vision Ears, nose, mouth, throat, and face: Denies mucositis or sore throat Respiratory: Denies cough, dyspnea or wheezes Cardiovascular: Denies palpitation or lower extremity swelling (+) Occasional chest pain Gastrointestinal:  Denies nausea, heartburn or change in bowel habits Skin: Denies abnormal skin rashes UA: (+) Left testicular shrinking (+) right testicular mass  Lymphatics: Denies new lymphadenopathy or easy bruising Neurological:Denies numbness, tingling or new weaknesses Behavioral/Psych: Mood is stable, no new changes  All other systems were reviewed with the patient and are negative.  MEDICAL HISTORY:  Past Medical History:  Diagnosis Date  . Alcohol abuse   . Anemia   . Arthritis    hands  . Blood transfusion without reported diagnosis   . Cancer [facility])    colon cancer  . Chronic low back pain 10/14/2011  . Colon polyps    hyperplastic (2004, 2010) and adenomatous (1990).    . COLONIC POLYPS, HX OF 03/05/2008  . Esophageal stricture    hx of  . Gastric ulcer   . GERD 03/05/2008  . GERD (gastroesophageal reflux disease) 1994   associated peptic  strictures  . HYPERLIPIDEMIA 03/05/2008  . HYPERTENSION 03/05/2008  . Hypertension   . IBS 03/05/2008  . Impaired glucose tolerance 10/12/2011  . Iron deficiency anemia   . LEG CRAMPS 09/02/2010  . Myocardial infarction [facility])    May 2018  . PAD (peripheral  artery disease) ([place]) 10/14/2011  . PAD (peripheral artery disease) ([place]) 2013  . PERIPHERAL EDEMA 09/02/2010  . PERIPHERAL NEUROPATHY 03/05/2008  . Prostatitis    hx of  . VARICOSE VEINS, LOWER EXTREMITIES 06/09/2009    SURGICAL HISTORY: Past Surgical History:  Procedure Laterality Date  . CATARACT EXTRACTION  bilat  . COLECTOMY    . COLONOSCOPY    . CORONARY ARTERY BYPASS GRAFT N/A 02/15/2017   Procedure: CORONARY ARTERY BYPASS GRAFTING times three  with left internal mammary harvest and endoscopic harvest of Right SVG. Grafts of LIMA to  LAD, SVG to Distal Circ, and to First Diag.;  Surgeon: Grace Isaac, MD;  Location: [facility];  Service: Open Heart Surgery;  Laterality: N/A;  . ESOPHAGOGASTRODUODENOSCOPY N/A 11/14/2014   Procedure: ESOPHAGOGASTRODUODENOSCOPY (EGD);  Surgeon: Jerene Bears, MD;  Location: [facility] ENDOSCOPY;  Service: Endoscopy;  Laterality: N/A;  . IR RADIOLOGIST EVAL & MGMT  07/04/2018  . IR RADIOLOGIST EVAL & MGMT  07/18/2018  . LAPAROTOMY N/A 05/19/2018   Procedure: EXPLORATORY LAPAROTOMY RIGHT COLECTOMY;  Surgeon: Fanny Skates, MD;  Location: [facility] ORS;  Service: General;  Laterality: N/A;  . LEFT HEART CATH AND CORONARY ANGIOGRAPHY N/A 02/14/2017   Procedure: Left Heart Cath and Coronary Angiography;  Surgeon: Belva Crome, MD;  Location: [place] CV LAB;  Service: Cardiovascular;  Laterality: N/A;  . LUMBAR SPINE SURGERY  11/2008   Dr Joya Salm  . TEE WITHOUT CARDIOVERSION N/A 02/15/2017   Procedure: TRANSESOPHAGEAL ECHOCARDIOGRAM (TEE);  Surgeon: Grace Isaac, MD;  Location: [place];  Service: Open Heart Surgery;  Laterality: N/A;  . UPPER GASTROINTESTINAL ENDOSCOPY      I have reviewed the social history and family history with the patient and they are unchanged from previous note.  ALLERGIES:  is allergic to no known allergies.  MEDICATIONS:  Current Outpatient Medications  Medication Sig Dispense Refill  . acetaminophen (TYLENOL) 500 MG tablet Take 1 tablet  (500 mg total) by mouth every 6 (six) hours as needed for mild pain.  0  . aspirin EC 81 MG EC tablet Take 1 tablet (81 mg total) by mouth daily. 30 tablet 1  . Cholecalciferol (VITAMIN D-3 PO) Take 2,000 Units by mouth daily.    . Ferrous Sulfate (IRON) 325 (65 Fe) MG TABS TAKE 1 TABLET BY MOUTH THREE TIMES DAILY WITH MEALS 90 tablet 0  . metoprolol succinate (TOPROL-XL) 50 MG 24 hr tablet Take 1 tablet (50 mg total) by mouth daily. Take with or immediately following a meal. 90 tablet 3  . nitroGLYCERIN (NITROSTAT) 0.4 MG SL tablet Place 1 tablet (0.4 mg total) under the tongue every 5 (five) minutes as needed for chest pain (or tightness). Refill per cardiology in future 12 tablet 0  . pravastatin (PRAVACHOL) 40 MG tablet TAKE 1 TABLET BY MOUTH AT BEDTIME 90 tablet 3   No current facility-administered medications for this visit.    PHYSICAL EXAMINATION: ECOG PERFORMANCE STATUS: 1 - Symptomatic but completely ambulatory  Vitals:   12/05/19 1026  BP: (!) 150/91  Pulse: 80  Resp: 20  Temp: 97.8 F (36.6 C)  SpO2: 99%   Filed Weights   12/05/19 1026  Weight: 231 lb  6.4 oz (105 kg)    GENERAL:alert, no distress and comfortable SKIN: skin color, texture, turgor are normal, no rashes or significant lesions EYES: normal, Conjunctiva are pink and non-injected, sclera clear  NECK: supple, thyroid normal size, non-tender, without nodularity LYMPH:  no palpable lymphadenopathy in the cervical, axillary  LUNGS: clear to auscultation and percussion with normal breathing effort HEART: regular rate & rhythm and no murmurs and no lower extremity edema ABDOMEN:abdomen soft, non-tender and normal bowel sounds Musculoskeletal:no cyanosis of digits and no clubbing  NEURO: alert & oriented x 3 with fluent speech, no focal motor/sensory deficits  LABORATORY DATA:  I have reviewed the data as listed CBC Latest Ref Rng & Units 12/05/2019 08/20/2019 04/24/2019  WBC 4.0 - 10.5 K/uL 5.0 4.8 4.5   Hemoglobin 13.0 - 17.0 g/dL 15.4 14.4 15.2  Hematocrit 39.0 - 52.0 % 46.7 43.1 44.8  Platelets 150 - 400 K/uL 159 180 192     CMP Latest Ref Rng & Units 12/05/2019 08/20/2019 04/24/2019  Glucose 70 - 99 mg/dL 126(H) 117(H) 114(H)  BUN 8 - 23 mg/dL 14 13 11   Creatinine 0.61 - 1.24 mg/dL 1.09 0.99 1.02  Sodium 135 - 145 mmol/L 139 138 138  Potassium 3.5 - 5.1 mmol/L 3.9 4.3 4.4  Chloride 98 - 111 mmol/L 104 105 101  CO2 22 - 32 mmol/L 26 24 22   Calcium 8.9 - 10.3 mg/dL 9.0 8.8(L) 9.4  Total Protein 6.5 - 8.1 g/dL 6.7 6.7 6.3  Total Bilirubin 0.3 - 1.2 mg/dL 0.6 0.6 0.4  Alkaline Phos 38 - 126 U/L 88 86 99  AST 15 - 41 U/L 15 16 14   ALT 0 - 44 U/L 13 14 10       RADIOGRAPHIC STUDIES: I have personally reviewed the radiological images as listed and agreed with the findings in the report. No results found.   ASSESSMENT & PLAN:  Vincent Mcclure is a 86 y.o. male with    1. Adenocarcinoma of right colon, invasive adenocarcinoma, stage IIIB (pT4a, pN1, cM0), MSS -Diagnosed in 05/2018. Treated with hemicolectomy. HereceivedadjuvantXeloda 2000 mg BID one week on, one week offfrom10/21/2019 to 01/29/2019, he overall tolerated well. Continue cancer surveillance. -From a colon cancer standpoint he is clinically doing well. Labs reviewed, CBC and CMP WNL except BG 126. CEA still pending. Physical exam normal.  -Continue surveillance. Plan for next and likely last surveillance scan before next OV.  -F/u in 6 months   2. Anemia, due to chronic blood lossand iron deficiency -He is currently on oral iron.Improved after surgery -Anemiahas resolved. Ferritin still pending today.   3. CAD S/P CABG -f/u withDr Opalski and Dr Meda Coffee. -stable -He has been having occasional chest pain since surgery and uses nitroglycerin as needed. Overall occurred 3 times.  -BP has been mildly elevated and stable in clinic.   4. 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 Self report right testicular mass  -He notes he recently noticed this in the last few months. He has no pain from this. He declined exam today  -He has not been seen by urology for evaluation, I will refer him to [facility]. He is agreeable.  -I suggested [place] in meantime. His prior CT AP in 08/2019 did not show these abnormalities. He declined [place] at this time.     [facility] referral  -F/u in 6 months with Lab and CT AP w Contrast a few  days before    No problem-specific Assessment & Plan notes found for this encounter.   Orders Placed This Encounter  Procedures  . CT Abdomen Pelvis W Contrast    Standing Status:   Future    Standing Expiration Date:   12/04/2020    Order Specific Question:   If indicated for the ordered procedure, I authorize the administration of contrast media per Radiology protocol    Answer:   Yes    Order Specific Question:   Preferred imaging location?    Answer:   [facility]    Order Specific Question:   Is Oral Contrast requested for this exam?    Answer:   Yes, Per Radiology protocol    Order Specific Question:   Radiology Contrast Protocol - do NOT remove file path    Answer:   \\[email or web address]  . CT Chest W Contrast    Standing Status:   Future    Standing Expiration Date:   12/04/2020    Order Specific Question:   If indicated for the ordered procedure, I authorize the administration of contrast media per Radiology protocol    Answer:   Yes    Order Specific Question:   Preferred imaging location?    Answer:   [facility]    Order Specific Question:   Radiology Contrast Protocol - do NOT remove file path    Answer:   \\[email or web address]   All questions were answered. The patient knows to call the clinic with any problems, questions or concerns. No barriers to learning was detected. The  total time spent in the appointment was 25 minutes.     Truitt Merle, MD 12/05/2019   I, Joslyn Devon, am acting as scribe for Truitt Merle, MD.   I have reviewed the above documentation for accuracy and completeness, and I agree with the above.

## 2019-12-05 ENCOUNTER — Other Ambulatory Visit: Payer: Self-pay

## 2019-12-05 ENCOUNTER — Encounter: Payer: Self-pay | Admitting: Hematology

## 2019-12-05 ENCOUNTER — Ambulatory Visit: Payer: Medicare HMO | Admitting: Hematology

## 2019-12-05 ENCOUNTER — Inpatient Hospital Stay: Payer: Medicare HMO | Admitting: Hematology

## 2019-12-05 ENCOUNTER — Other Ambulatory Visit: Payer: Medicare HMO

## 2019-12-05 ENCOUNTER — Inpatient Hospital Stay: Payer: Medicare HMO | Attending: Hematology

## 2019-12-05 VITALS — BP 150/91 | HR 80 | Temp 97.8°F | Resp 20 | Ht 71.0 in | Wt 231.4 lb

## 2019-12-05 DIAGNOSIS — G620 Drug-induced polyneuropathy: Secondary | ICD-10-CM | POA: Diagnosis not present

## 2019-12-05 DIAGNOSIS — D509 Iron deficiency anemia, unspecified: Secondary | ICD-10-CM | POA: Insufficient documentation

## 2019-12-05 DIAGNOSIS — C182 Malignant neoplasm of ascending colon: Secondary | ICD-10-CM

## 2019-12-05 DIAGNOSIS — Z85038 Personal history of other malignant neoplasm of large intestine: Secondary | ICD-10-CM | POA: Diagnosis not present

## 2019-12-05 DIAGNOSIS — I2510 Atherosclerotic heart disease of native coronary artery without angina pectoris: Secondary | ICD-10-CM | POA: Insufficient documentation

## 2019-12-05 LAB — CBC WITH DIFFERENTIAL (CANCER CENTER ONLY)
Abs Immature Granulocytes: 0.01 10*3/uL (ref 0.00–0.07)
Basophils Absolute: 0 10*3/uL (ref 0.0–0.1)
Basophils Relative: 0 %
Eosinophils Absolute: 0.1 10*3/uL (ref 0.0–0.5)
Eosinophils Relative: 1 %
HCT: 46.7 % (ref 39.0–52.0)
Hemoglobin: 15.4 g/dL (ref 13.0–17.0)
Immature Granulocytes: 0 %
Lymphocytes Relative: 29 %
Lymphs Abs: 1.5 10*3/uL (ref 0.7–4.0)
MCH: 30 pg (ref 26.0–34.0)
MCHC: 33 g/dL (ref 30.0–36.0)
MCV: 91 fL (ref 80.0–100.0)
Monocytes Absolute: 0.6 10*3/uL (ref 0.1–1.0)
Monocytes Relative: 11 %
Neutro Abs: 2.9 10*3/uL (ref 1.7–7.7)
Neutrophils Relative %: 59 %
Platelet Count: 159 10*3/uL (ref 150–400)
RBC: 5.13 MIL/uL (ref 4.22–5.81)
RDW: 13.2 % (ref 11.5–15.5)
WBC Count: 5 10*3/uL (ref 4.0–10.5)
nRBC: 0 % (ref 0.0–0.2)

## 2019-12-05 LAB — CMP (CANCER CENTER ONLY)
ALT: 13 U/L (ref 0–44)
AST: 15 U/L (ref 15–41)
Albumin: 3.9 g/dL (ref 3.5–5.0)
Alkaline Phosphatase: 88 U/L (ref 38–126)
Anion gap: 9 (ref 5–15)
BUN: 14 mg/dL (ref 8–23)
CO2: 26 mmol/L (ref 22–32)
Calcium: 9 mg/dL (ref 8.9–10.3)
Chloride: 104 mmol/L (ref 98–111)
Creatinine: 1.09 mg/dL (ref 0.61–1.24)
GFR, Est AFR Am: >60 mL/min (ref >60)
GFR, Estimated: >60 mL/min (ref >60)
Glucose, Bld: 126 mg/dL — ABNORMAL HIGH (ref 70–99)
Potassium: 3.9 mmol/L (ref 3.5–5.1)
Sodium: 139 mmol/L (ref 135–145)
Total Bilirubin: 0.6 mg/dL (ref 0.3–1.2)
Total Protein: 6.7 g/dL (ref 6.5–8.1)

## 2019-12-05 LAB — FERRITIN: Ferritin: 164 ng/mL (ref 24–336)

## 2019-12-05 LAB — CEA (IN HOUSE-CHCC): CEA (CHCC-In House): 3.04 ng/mL (ref 0.00–5.00)

## 2019-12-06 ENCOUNTER — Telehealth: Payer: Self-pay | Admitting: Hematology

## 2019-12-06 NOTE — Telephone Encounter (Signed)
Scheduled appt per 2/24 los.  Sent a message to HIM pool to get a  Calendar mailed out.

## 2019-12-10 ENCOUNTER — Telehealth: Payer: Self-pay

## 2019-12-10 ENCOUNTER — Telehealth: Payer: Self-pay | Admitting: *Deleted

## 2019-12-10 NOTE — Telephone Encounter (Signed)
-----   Message from Truitt Merle, MD sent at 12/09/2019 10:59 PM EST ----- Please let pt know his CEA, CMP, and iron level were all normal. No concerns.   Thanks  Truitt Merle  12/09/2019

## 2019-12-10 NOTE — Telephone Encounter (Signed)
Left message for pt to return call regarding lab results.

## 2019-12-10 NOTE — Telephone Encounter (Signed)
TC to Pt per Dr. [name] informed Pt that CEA,CMP and iron level were all normal. Pt. Verbalized understanding. Pt inquired about referral for urologist informed Pt that they will call him with appointment. Pt verbalized understanding.

## 2019-12-20 ENCOUNTER — Ambulatory Visit: Payer: Medicare HMO | Attending: Internal Medicine

## 2019-12-20 DIAGNOSIS — Z23 Encounter for immunization: Secondary | ICD-10-CM

## 2019-12-20 NOTE — Progress Notes (Signed)
   Covid-19 Vaccination Clinic  Name:  Vincent Mcclure    MRN: [id number] DOB: 1932-04-24  12/20/2019  [name] was observed post Covid-19 immunization for 15 minutes without incident. He was provided with Vaccine Information Sheet and instruction to access the V-Safe system.   [name] was instructed to call 911 with any severe reactions post vaccine: Marland Kitchen Difficulty breathing  . Swelling of face and throat  . A fast heartbeat  . A bad rash all over body  . Dizziness and weakness   Immunizations Administered    Name Date Dose VIS Date Route   Pfizer COVID-19 Vaccine 12/20/2019 12:11 PM 0.3 mL 09/21/2019 Intramuscular   Manufacturer: [place]   Lot: [id number]   [place]: [address]-0

## 2020-01-14 ENCOUNTER — Ambulatory Visit: Payer: Medicare HMO | Attending: Internal Medicine

## 2020-01-14 DIAGNOSIS — Z23 Encounter for immunization: Secondary | ICD-10-CM

## 2020-01-14 NOTE — Progress Notes (Signed)
   Covid-19 Vaccination Clinic  Name:  Vincent Mcclure    MRN: [id number] DOB: 1932/02/22  01/14/2020  Vincent Mcclure was observed post Covid-19 immunization for 15 minutes without incident. He was provided with Vaccine Information Sheet and instruction to access the V-Safe system.   Vincent Mcclure was instructed to call 911 with any severe reactions post vaccine: Marland Kitchen Difficulty breathing  . Swelling of face and throat  . A fast heartbeat  . A bad rash all over body  . Dizziness and weakness   Immunizations Administered    Name Date Dose VIS Date Route   Pfizer COVID-19 Vaccine 01/14/2020 11:11 AM 0.3 mL 09/21/2019 Intramuscular   Manufacturer: [employer]   Lot: [id number]   [place]: [id number]

## 2020-01-21 DIAGNOSIS — N430 Encysted hydrocele: Secondary | ICD-10-CM | POA: Diagnosis not present

## 2020-02-01 DIAGNOSIS — N430 Encysted hydrocele: Secondary | ICD-10-CM | POA: Diagnosis not present

## 2020-02-13 DIAGNOSIS — H3530 Unspecified macular degeneration: Secondary | ICD-10-CM | POA: Diagnosis not present

## 2020-02-19 DIAGNOSIS — H35373 Puckering of macula, bilateral: Secondary | ICD-10-CM | POA: Diagnosis not present

## 2020-02-19 DIAGNOSIS — H43822 Vitreomacular adhesion, left eye: Secondary | ICD-10-CM | POA: Diagnosis not present

## 2020-02-19 DIAGNOSIS — H35033 Hypertensive retinopathy, bilateral: Secondary | ICD-10-CM | POA: Diagnosis not present

## 2020-02-19 DIAGNOSIS — H353132 Nonexudative age-related macular degeneration, bilateral, intermediate dry stage: Secondary | ICD-10-CM | POA: Diagnosis not present

## 2020-03-19 ENCOUNTER — Other Ambulatory Visit: Payer: Self-pay

## 2020-03-19 ENCOUNTER — Ambulatory Visit: Payer: Medicare HMO | Admitting: Cardiology

## 2020-03-19 ENCOUNTER — Encounter: Payer: Self-pay | Admitting: Cardiology

## 2020-03-19 VITALS — BP 152/86 | HR 87 | Ht 71.0 in | Wt 235.0 lb

## 2020-03-19 DIAGNOSIS — I25810 Atherosclerosis of coronary artery bypass graft(s) without angina pectoris: Secondary | ICD-10-CM

## 2020-03-19 DIAGNOSIS — R600 Localized edema: Secondary | ICD-10-CM | POA: Diagnosis not present

## 2020-03-19 DIAGNOSIS — I2510 Atherosclerotic heart disease of native coronary artery without angina pectoris: Secondary | ICD-10-CM

## 2020-03-19 DIAGNOSIS — I209 Angina pectoris, unspecified: Secondary | ICD-10-CM | POA: Diagnosis not present

## 2020-03-19 DIAGNOSIS — I214 Non-ST elevation (NSTEMI) myocardial infarction: Secondary | ICD-10-CM | POA: Diagnosis not present

## 2020-03-19 MED ORDER — NITROGLYCERIN 0.4 MG SL SUBL: 0.4000 mg | SUBLINGUAL_TABLET | SUBLINGUAL | 3 refills | Status: DC | PRN

## 2020-03-19 MED ORDER — HYDROCHLOROTHIAZIDE 25 MG PO TABS: 25.0000 mg | ORAL_TABLET | Freq: Every day | ORAL | 2 refills | Status: DC

## 2020-03-19 NOTE — Progress Notes (Signed)
03/19/2020 Vincent Mcclure   Dec 25, 1931  [id number]  Primary Physician Lorrene Reid, PA-C Primary Cardiologist: Dr. Meda Coffee  Electrophysiologist: None   Reason for Visit/CC: Chest Pain  HPI:  Vincent Mcclure is a 86 y.o. male Vincent Mcclure is a 86 y.o. male with history of CAD, hypertension, hyperlipidemia, gastric ulcers with big GI bleed in 2016 secondary to alcohol and previous tobacco abuse.  He also has history of colon cancer s/p hemicolectomy in 0277 with complications and prolonged hospitalizations for 2 months..  He was admitted with a non-STEMI in 2018 and found to have multivessel CAD and preserved LV function on cardiac cath. He underwent CABG x3 on Feb 15, 2017.  He had postoperative atrial fibrillation that was treated with amiodarone and he converted to normal sinus rhythm.  CHA2DS2 VASc score =4 but HAS-BLED score was 5 so no anticoagulation at that time per Dr. Sallyanne Kuster.  Bleed risk exceeds potential stroke reduction benefit.  Patient was recently seen by Dr. [name] for colon cancer follow-up. A staff message was sent to our office requesting an appointment with cardiology.  Per Dr.Feng's message, she reported that the patient was doing well from a cancer standpoint however he had endorsed to her at that visit that he had had an episodes of chest pain. Stress test was recommended.  This was offered to the patient however he declined.  He wanted to further discuss his symptoms at office visit prior to proceeding with the study.  He was seen by Ellen Henri in July 2020 for episodes of atypical chest pain, stress test was held off at that time.  11/19/2019 - 2 weeks follow up, the patient is coming for an in-person visit to obtain ECG and possibly stress test if is symptoms are persistent. He states that he has had 3 episodes of chest pain in the last 6 months, they are not related to exertion and they resolve with nitroglycerin.  She denies any orthopnea proximal nocturnal dyspnea  no lower extremity edema, no dizziness or falls.  03/19/2020 -the patient is coming after 4 months, he has been doing great, he continues to stay active and does all of work in his yard without any symptoms of chest pain or shortness of breath.  He does not have episodes of orthostatic hypotension, balance issues or falls.  He only used sublingual nitroglycerin on 2 occasions.  He has been compliant with his meds.  He is complaining of mild lower extremity edema that happens toward the end of days.  He has gained about 10 pounds since last visit.   Cardiac Studies   2D echocardiogram 06/03/2018 Study Conclusions  - Left ventricle: The cavity size was normal. Wall thickness was   increased in a pattern of mild LVH. Systolic function was normal.   The estimated ejection fraction was in the range of 55% to 60%.   Wall motion was normal; there were no regional wall motion   abnormalities. Doppler parameters are consistent with abnormal   left ventricular relaxation (grade 1 diastolic dysfunction). - Aortic valve: Mildly calcified annulus. Trileaflet; mildly   thickened leaflets. Valve area (VTI): 4.6 cm^2. Valve area   (Vmax): 4.22 cm^2. - Right atrium: There is a 1.2 by 1.6 cm echodense mass in the   right atrium, overall poorly visualized but best seen in the   apical 4 chamber views. The subcostal views are limited . When   reviewing the 02/15/17 TEE this area is also poorly visualized, but  does appear to be due to a prominent eustachian valve, a benign   finding. - Pulmonary arteries: Systolic pressure was mildly increased. PA   peak pressure: 37 mm Hg (S). - Systemic veins: IVC is small suggesting low RA pressure and   hypovolemia.  Current Meds  Medication Sig  . acetaminophen (TYLENOL) 500 MG tablet Take 1 tablet (500 mg total) by mouth every 6 (six) hours as needed for mild pain.  Marland Kitchen aspirin EC 81 MG EC tablet Take 1 tablet (81 mg total) by mouth daily.  . Cholecalciferol (VITAMIN  D-3 PO) Take 2,000 Units by mouth daily.  . Ferrous Sulfate (IRON) 325 (65 Fe) MG TABS TAKE 1 TABLET BY MOUTH THREE TIMES DAILY WITH MEALS  . metoprolol succinate (TOPROL-XL) 50 MG 24 hr tablet Take 1 tablet (50 mg total) by mouth daily. Take with or immediately following a meal.  . nitroGLYCERIN (NITROSTAT) 0.4 MG SL tablet Place 1 tablet (0.4 mg total) under the tongue every 5 (five) minutes as needed for chest pain (or tightness).  . pravastatin (PRAVACHOL) 40 MG tablet TAKE 1 TABLET BY MOUTH AT BEDTIME  . [DISCONTINUED] nitroGLYCERIN (NITROSTAT) 0.4 MG SL tablet Place 1 tablet (0.4 mg total) under the tongue every 5 (five) minutes as needed for chest pain (or tightness). Refill per cardiology in future   Allergies  Allergen Reactions  . No Known Allergies    Past Medical History:  Diagnosis Date  . Alcohol abuse   . Anemia   . Arthritis    hands  . Blood transfusion without reported diagnosis   . Cancer [facility])    colon cancer  . Chronic low back pain 10/14/2011  . Colon polyps    hyperplastic (2004, 2010) and adenomatous (1990).    . COLONIC POLYPS, HX OF 03/05/2008  . Esophageal stricture    hx of  . Gastric ulcer   . GERD 03/05/2008  . GERD (gastroesophageal reflux disease) 1994   associated peptic strictures  . HYPERLIPIDEMIA 03/05/2008  . HYPERTENSION 03/05/2008  . Hypertension   . IBS 03/05/2008  . Impaired glucose tolerance 10/12/2011  . Iron deficiency anemia   . LEG CRAMPS 09/02/2010  . Myocardial infarction [facility])    May 2018  . PAD (peripheral artery disease) ([place]) 10/14/2011  . PAD (peripheral artery disease) ([place]) 2013  . PERIPHERAL EDEMA 09/02/2010  . PERIPHERAL NEUROPATHY 03/05/2008  . Prostatitis    hx of  . VARICOSE VEINS, LOWER EXTREMITIES 06/09/2009   Family History  Problem Relation Age of Onset  . Cancer Mother        Brain Cancer  . Diabetes Father   . Heart disease Father        CAD  . Hypertension Father   . Stroke Father   . Diabetes Sister   .  Stomach cancer Neg Hx   . Pancreatic cancer Neg Hx   . Colon cancer Neg Hx   . Esophageal cancer Neg Hx   . Rectal cancer Neg Hx    Past Surgical History:  Procedure Laterality Date  . CATARACT EXTRACTION  bilat  . COLECTOMY    . COLONOSCOPY    . CORONARY ARTERY BYPASS GRAFT N/A 02/15/2017   Procedure: CORONARY ARTERY BYPASS GRAFTING times three  with left internal mammary harvest and endoscopic harvest of Right SVG. Grafts of LIMA to  LAD, SVG to Distal Circ, and to First Diag.;  Surgeon: Grace Isaac, MD;  Location: [place];  Service: Open Heart Surgery;  Laterality: N/A;  . ESOPHAGOGASTRODUODENOSCOPY N/A 11/14/2014   Procedure: ESOPHAGOGASTRODUODENOSCOPY (EGD);  Surgeon: Jerene Bears, MD;  Location[facility] ENDOSCOPY;  Service: Endoscopy;  Laterality: N/A;  . IR RADIOLOGIST EVAL & MGMT  07/04/2018  . IR RADIOLOGIST EVAL & MGMT  07/18/2018  . LAPAROTOMY N/A 05/19/2018   Procedure: EXPLORATORY LAPAROTOMY RIGHT COLECTOMY;  Surgeon: Fanny Skates, MD;  Location: [facility];  Service: General;  Laterality: N/A;  . LEFT HEART CATH AND CORONARY ANGIOGRAPHY N/A 02/14/2017   Procedure: Left Heart Cath and Coronary Angiography;  Surgeon: Belva Crome, MD;  Location: [place] [facility];  Service: Cardiovascular;  Laterality: N/A;  . LUMBAR SPINE SURGERY  11/2008   Dr Joya Salm  . TEE WITHOUT CARDIOVERSION N/A 02/15/2017   Procedure: TRANSESOPHAGEAL ECHOCARDIOGRAM (TEE);  Surgeon: Grace Isaac, MD;  Location: [place];  Service: Open Heart Surgery;  Laterality: N/A;  . UPPER GASTROINTESTINAL ENDOSCOPY     Social History   Socioeconomic History  . Marital status: Married    Spouse name: Not on file  . Number of children: 4  . Years of education: Not on file  . Highest education level: Not on file  Occupational History  . Occupation: Retired Engineer, maintenance  Tobacco Use  . Smoking status: Former Smoker    Packs/day: 1.25    Years: 50.00    Pack years: 62.50    Types: Cigarettes    Quit date:  11/11/2008    Years since quitting: 11.3  . Smokeless tobacco: Former Systems developer    Types: Chew  Substance and Sexual Activity  . Alcohol use: Yes    Alcohol/week: 6.0 standard drinks    Types: 2 Glasses of wine, 4 Cans of beer per week    Comment: vodka  . Drug use: No  . Sexual activity: Never  Other Topics Concern  . Not on file  Social History Narrative   ** Merged History Encounter **       4 sons   Social Determinants of Radio broadcast assistant Strain:   . Difficulty of Paying Living Expenses:   Food Insecurity:   . Worried About Charity fundraiser in the Last Year:   . Arboriculturist in the Last Year:   Transportation Needs:   . Film/video editor (Medical):   Marland Kitchen Lack of Transportation (Non-Medical):   Physical Activity:   . Days of Exercise per Week:   . Minutes of Exercise per Session:   Stress:   . Feeling of Stress :   Social Connections:   . Frequency of Communication with Friends and Family:   . Frequency of Social Gatherings with Friends and Family:   . Attends Religious Services:   . Active Member of Clubs or Organizations:   . Attends Archivist Meetings:   Marland Kitchen Marital Status:   Intimate Partner Violence:   . Fear of Current or Ex-Partner:   . Emotionally Abused:   Marland Kitchen Physically Abused:   . Sexually Abused:      Lipid Panel     Component Value Date/Time   CHOL 133 04/24/2019 0917   TRIG 169 (H) 04/24/2019 0917   HDL 47 04/24/2019 0917   CHOLHDL 2.8 04/24/2019 0917   CHOLHDL 3.4 02/12/2017 0657   VLDL 17 02/12/2017 0657   LDLCALC 52 04/24/2019 0917   LDLDIRECT 64 04/24/2019 0917   LDLDIRECT 133.2 10/14/2011 1106    Review of Systems: General: negative for chills, fever, night sweats or weight  changes.  Cardiovascular: negative for chest pain, dyspnea on exertion, edema, orthopnea, palpitations, paroxysmal nocturnal dyspnea or shortness of breath Dermatological: negative for rash Respiratory: negative for cough or  wheezing Urologic: negative for hematuria Abdominal: negative for nausea, vomiting, diarrhea, bright red blood per rectum, melena, or hematemesis Neurologic: negative for visual changes, syncope, or dizziness All other systems reviewed and are otherwise negative except as noted above.   Physical Exam:  Blood pressure (!) 152/86, pulse 87, height 5\' 11"  (1.803 m), weight 235 lb (106.6 kg), SpO2 96 %.  General appearance: alert, cooperative and no distress Neck: no carotid bruit and no JVD Lungs: clear to auscultation bilaterally Heart: regular rate and rhythm, S1, S2 normal, no murmur, click, rub or gallop Extremities: extremities normal, atraumatic, no cyanosis or edema Pulses: 2+ and symmetric Skin: Skin color, texture, turgor normal. No rashes or lesions Neurologic: Grossly normal  EKG sinus rhythm 81 beats per minute, normal EKG, when compared to the prior study heart rate has decreased. Personally reviewed    ASSESSMENT AND PLAN:   1. CAD status post CABG 02/15/17.  Stable rare angina controlled with nitroglycerin, will continue to same management.  EKG is normal.    2. PAF postop maintaining normal sinus rhythm, now off amiodarone.  Denies any palpitations.  3. Essential hypertension - slightly elevated, we will add hydrochlorothiazide 25 mg daily that will also help with his lower extremity edema.  4. Mixed hyperlipidemia - continue pravastatin, he is tolerating it well.  5.  Lower extremity edema -dependent toward the end of days.  We will start hydrochlorothiazide 25 mg daily.  6. Adenocarcinoma of right colon, invasive adenocarcinoma, stage IIIB (pT4a, pN1, cM0), MSS, followed by Dr. [name] -Diagnosed in 05/2018. Treated with hemicolectomy. HereceivedadjuvantXeloda 2000 mg BID one week on, one week offfrom10/21/2019 to 01/29/2019, he overall tolerated well. Continue cancer surveillance.  Follow-up in 4 months, will obtain all labs at that time.  Ena Dawley ,  MD [facility] 03/19/2020 9:05 AM

## 2020-03-19 NOTE — Patient Instructions (Signed)
Medication Instructions:   START TAKING HYDROCHLOROTHIAZIDE 25 MG BY MOUTH DAILY  *If you need a refill on your cardiac medications before your next appointment, please call your pharmacy*    Follow-Up:  4 MONTHS IN THE OFFICE WITH DR. Meda Coffee

## 2020-05-20 ENCOUNTER — Other Ambulatory Visit: Payer: Self-pay | Admitting: Hematology

## 2020-05-20 DIAGNOSIS — C182 Malignant neoplasm of ascending colon: Secondary | ICD-10-CM

## 2020-05-27 ENCOUNTER — Telehealth: Payer: Self-pay

## 2020-05-27 NOTE — Telephone Encounter (Signed)
Received fax notification that patient's labs are not scheduled before Ct scan.  I left vm on home phone explaining this to patient and asked for return call.

## 2020-05-28 ENCOUNTER — Inpatient Hospital Stay: Payer: Medicare HMO | Attending: Hematology

## 2020-05-28 ENCOUNTER — Other Ambulatory Visit: Payer: Self-pay

## 2020-05-28 DIAGNOSIS — J439 Emphysema, unspecified: Secondary | ICD-10-CM | POA: Diagnosis not present

## 2020-05-28 DIAGNOSIS — I700 Atherosclerosis of aorta: Secondary | ICD-10-CM | POA: Diagnosis not present

## 2020-05-28 DIAGNOSIS — I10 Essential (primary) hypertension: Secondary | ICD-10-CM | POA: Diagnosis not present

## 2020-05-28 DIAGNOSIS — G629 Polyneuropathy, unspecified: Secondary | ICD-10-CM | POA: Diagnosis not present

## 2020-05-28 DIAGNOSIS — R911 Solitary pulmonary nodule: Secondary | ICD-10-CM | POA: Insufficient documentation

## 2020-05-28 DIAGNOSIS — E785 Hyperlipidemia, unspecified: Secondary | ICD-10-CM | POA: Diagnosis not present

## 2020-05-28 DIAGNOSIS — I739 Peripheral vascular disease, unspecified: Secondary | ICD-10-CM | POA: Diagnosis not present

## 2020-05-28 DIAGNOSIS — I252 Old myocardial infarction: Secondary | ICD-10-CM | POA: Insufficient documentation

## 2020-05-28 DIAGNOSIS — Z79899 Other long term (current) drug therapy: Secondary | ICD-10-CM | POA: Diagnosis not present

## 2020-05-28 DIAGNOSIS — I2510 Atherosclerotic heart disease of native coronary artery without angina pectoris: Secondary | ICD-10-CM | POA: Insufficient documentation

## 2020-05-28 DIAGNOSIS — K219 Gastro-esophageal reflux disease without esophagitis: Secondary | ICD-10-CM | POA: Diagnosis not present

## 2020-05-28 DIAGNOSIS — D3502 Benign neoplasm of left adrenal gland: Secondary | ICD-10-CM | POA: Diagnosis not present

## 2020-05-28 DIAGNOSIS — D509 Iron deficiency anemia, unspecified: Secondary | ICD-10-CM | POA: Diagnosis not present

## 2020-05-28 DIAGNOSIS — C182 Malignant neoplasm of ascending colon: Secondary | ICD-10-CM | POA: Diagnosis not present

## 2020-05-28 DIAGNOSIS — Z7982 Long term (current) use of aspirin: Secondary | ICD-10-CM | POA: Diagnosis not present

## 2020-05-28 LAB — CBC WITH DIFFERENTIAL (CANCER CENTER ONLY)
Abs Immature Granulocytes: 0.01 10*3/uL (ref 0.00–0.07)
Basophils Absolute: 0 10*3/uL (ref 0.0–0.1)
Basophils Relative: 1 %
Eosinophils Absolute: 0.1 10*3/uL (ref 0.0–0.5)
Eosinophils Relative: 2 %
HCT: 45 % (ref 39.0–52.0)
Hemoglobin: 15.1 g/dL (ref 13.0–17.0)
Immature Granulocytes: 0 %
Lymphocytes Relative: 27 %
Lymphs Abs: 1 10*3/uL (ref 0.7–4.0)
MCH: 29.5 pg (ref 26.0–34.0)
MCHC: 33.6 g/dL (ref 30.0–36.0)
MCV: 88.1 fL (ref 80.0–100.0)
Monocytes Absolute: 0.5 10*3/uL (ref 0.1–1.0)
Monocytes Relative: 13 %
Neutro Abs: 2.1 10*3/uL (ref 1.7–7.7)
Neutrophils Relative %: 57 %
Platelet Count: 162 10*3/uL (ref 150–400)
RBC: 5.11 MIL/uL (ref 4.22–5.81)
RDW: 12.8 % (ref 11.5–15.5)
WBC Count: 3.6 10*3/uL — ABNORMAL LOW (ref 4.0–10.5)
nRBC: 0 % (ref 0.0–0.2)

## 2020-05-28 LAB — CMP (CANCER CENTER ONLY)
ALT: 11 U/L (ref 0–44)
AST: 16 U/L (ref 15–41)
Albumin: 3.8 g/dL (ref 3.5–5.0)
Alkaline Phosphatase: 93 U/L (ref 38–126)
Anion gap: 9 (ref 5–15)
BUN: 13 mg/dL (ref 8–23)
CO2: 29 mmol/L (ref 22–32)
Calcium: 10 mg/dL (ref 8.9–10.3)
Chloride: 98 mmol/L (ref 98–111)
Creatinine: 1.14 mg/dL (ref 0.61–1.24)
GFR, Est AFR Am: >60 mL/min (ref >60)
GFR, Estimated: 58 mL/min — ABNORMAL LOW (ref >60)
Glucose, Bld: 145 mg/dL — ABNORMAL HIGH (ref 70–99)
Potassium: 3.6 mmol/L (ref 3.5–5.1)
Sodium: 136 mmol/L (ref 135–145)
Total Bilirubin: 0.9 mg/dL (ref 0.3–1.2)
Total Protein: 6.5 g/dL (ref 6.5–8.1)

## 2020-05-28 LAB — CEA (IN HOUSE-CHCC): CEA (CHCC-In House): 2.57 ng/mL (ref 0.00–5.00)

## 2020-05-28 LAB — FERRITIN: Ferritin: 249 ng/mL (ref 24–336)

## 2020-05-30 ENCOUNTER — Other Ambulatory Visit: Payer: Medicare HMO

## 2020-05-30 ENCOUNTER — Other Ambulatory Visit: Payer: Self-pay

## 2020-05-30 ENCOUNTER — Ambulatory Visit (HOSPITAL_COMMUNITY)
Admission: RE | Admit: 2020-05-30 | Discharge: 2020-05-30 | Disposition: A | Discharge status: Home / Self Care | Payer: Medicare HMO | Source: Ambulatory Visit | Attending: Hematology | Admitting: Hematology

## 2020-05-30 ENCOUNTER — Encounter (HOSPITAL_COMMUNITY): Payer: Self-pay

## 2020-05-30 DIAGNOSIS — C182 Malignant neoplasm of ascending colon: Secondary | ICD-10-CM | POA: Insufficient documentation

## 2020-05-30 DIAGNOSIS — I700 Atherosclerosis of aorta: Secondary | ICD-10-CM | POA: Diagnosis not present

## 2020-05-30 DIAGNOSIS — J432 Centrilobular emphysema: Secondary | ICD-10-CM | POA: Diagnosis not present

## 2020-05-30 MED ORDER — SODIUM CHLORIDE (PF) 0.9 % IJ SOLN
INTRAMUSCULAR | Status: AC
  Filled 2020-05-30: qty 50

## 2020-05-30 MED ORDER — IOHEXOL 300 MG/ML  SOLN
100.0000 mL | Freq: Once | INTRAMUSCULAR | Status: AC | PRN
  Administered 2020-05-30: 100 mL via INTRAVENOUS

## 2020-05-30 NOTE — Progress Notes (Signed)
[facility]   [phone] Fax:(336) [phone]   Clinic Follow up Note   Patient Care Team: Lorrene Reid, PA-C as PCP - General Pyrtle, Lajuan Lines, MD as Consulting Physician (Gastroenterology) Allyn Kenner, MD as Consulting Physician (Dermatology) Dorothy Spark, MD as Consulting Physician (Cardiology) Levin Erp, [place] as Physician Assistant (Gastroenterology) Murrell Converse as Physician Assistant (Cardiology)  Date of Service:  06/02/2020  CHIEF COMPLAINT:  F/u on stage III colon cancer  SUMMARY OF ONCOLOGIC HISTORY: Oncology History Overview Note  Cancer Staging Cancer of ascending colon pT4apN1 s/p right colectomy 05/19/2018 Staging form: Colon and Rectum, AJCC 8th Edition - Pathologic stage from 05/19/2018: Stage IIIB (pT4a, pN1, cM0) - Signed by Truitt Merle, MD on 05/24/2018     Cancer of ascending colon pT4apN1 s/p right colectomy 05/19/2018  05/18/2018 Imaging   05/18/2018 CT AP IMPRESSION: 1. There appears to be a nearly obstructing lesion in the region of the hepatic flexure of the colon which is highly concerning for primary colonic neoplasm. Slight haziness of the surrounding soft tissues may be indicative of early local invasion. This appears partially obstructive as evidenced by dilatation of more proximal aspects of the small bowel and colon. 2. Left adrenal lesion is stable compared to prior studies, previously characterized as an adenoma. 3. Aortic atherosclerosis, in addition to left main and 3 vessel coronary artery disease. Status post median sternotomy for CABG. 4. Additional incidental findings, as above.  Aortic Atherosclerosis (ICD10-I70.0).   05/18/2018 - 05/30/2018 Hospital Admission   Admit date: 05/18/2018 Admission diagnosis: Colon Cancer Discharged on: 05/30/2018   05/19/2018 Cancer Staging   Staging form: Colon and Rectum, AJCC 8th Edition - Pathologic stage from 05/19/2018: Stage IIIB (pT4a, pN1, cM0) - Signed by  Truitt Merle, MD on 05/24/2018    05/19/2018 Pathology Results   05/19/2018 Surgical Pathology Diagnosis Colon, segmental resection for tumor, right - INVASIVE COLORECTAL ADENOCARCINOMA, 5 CM. - CARCINOMA FOCALLY LESS THAN 0.1 CM FROM SEROSAL SURFACE. - MARGINS NOT INVOLVED. - METASTATIC CARCINOMA IN ONE OF EIGHT LYMPH NODES (1/9). - BENIGN APPENDIX.   05/19/2018 Surgery   EXPLORATORY LAPAROTOMY with right colectomy with Fanny Skates, MD   05/24/2018 Initial Diagnosis   Cancer of right colon ([place])   06/12/2018 Imaging    06/12/2018 CT CAP IMPRESSION: 1. Evidence all bowel perforation/anastomosed breakdown with large volume intraperitoneal free air fluid in the RIGHT upper quadrant adjacent and above the liver. 2. Large loculated fluid collection in the RIGHT anterior pelvis extending along the LEFT pericolic gutter consists with PERITONEAL ABSCESS. 3. Of note, no large volume intraperitoneal free fluid. Oral contrast passed through the RIGHT colon anastomosis without evidence of leak. Findings could indicate a prior anastomotic breakdown which has healed in the interval with subsequent abscess formation from the prior leak / breakdown.   06/17/2018 Procedure   06/17/2018 US Thoracentesis IMPRESSION: Successful ultrasound guided right thoracentesis yielding 1 L of pleural fluid. No pneumothorax on post-procedure chest x-ray.  No malignancy on pathology   06/20/2018 Imaging   06/20/2018 CT AP IMPRESSION: 1. Decompression of right upper quadrant air and fluid collection after percutaneous catheter drainage. There remains a tiny rim of fluid as well as small extraluminal gas bubbles in this region suspicious for continued leak from bowel. 2. Further decrease in size of residual abscess fluid collection in the pelvis after percutaneous catheter drainage. There remains some fluid around the drainage catheter in the right pelvis.   07/04/2018 Imaging   07/04/2018  CT AP IMPRESSION: 1.  Further decompression of right perihepatic abscess with resolution of extraluminal air. 2. Relatively stable volume of fluid surrounding the right pelvic drainage catheter.   07/05/2018 Imaging   07/05/2018 CT AP IMPRESSION: 1. No change compared to the CT scan performed 9 hours earlier. 2. Anterior right perihepatic space collection is decompressed by indwelling percutaneous drain, with no residual measurable collection in this location. 3. Thick walled right lower quadrant collection with indwelling percutaneous drain is stable. No new focal fluid collections. 4. Stable postsurgical changes from right hemicolectomy with no evidence of bowel obstruction. Stable reactive wall thickening in the sigmoid colon adjacent to the right lower quadrant collection. 5. Stable small to moderate dependent right pleural effusion. 6. Stable left adrenal adenoma. 7.  Aortic Atherosclerosis (ICD10-I70.0).   07/31/2018 - 01/21/2019 Chemotherapy   Adjuvant Xeloda 2000mg  bid, 7 days on, 7 days off.  Reduced to 2000mg  in the AM and 1500mg  in the PM due to diarrhea. Completed on 01/21/19    04/12/2019 Imaging   CT CAP W contrast 04/12/19  IMPRESSION: 1. Status post right hemicolectomy. 2. No definite metastatic disease in the chest, abdomen, or pelvis. 7 mm right lower lobe pulmonary nodule, obscured by atelectasis on previous chest CT and not included on multiple prior abdomen / pelvis CTs. Close follow-up recommended to ensure stability. 3.  Aortic Atherosclerois (ICD10-170.0)   09/04/2019 Imaging   CT AP W Contrast I MPRESSION: 1. No evidence of local colon cancer recurrence at the RIGHT hemicolectomy site. 2. No evidence of metastatic disease in the abdomen pelvis. 3. Mild nodularity at the LEFT lung base is favored benign. Recommend attention on routine surveillance 4. Aortic Atherosclerosis (ICD10-I70.0).   05/30/2020 Imaging   CT CAP w contrast IMPRESSION: 1. Significant enlargement of a  right lower lobe pulmonary nodule. Suspicious for primary bronchogenic carcinoma. Isolated pulmonary metastasis felt less likely. 2. Otherwise, no evidence of metastatic disease from patient's colon cancer. 3. Aortic atherosclerosis (ICD10-I70.0) and emphysema (ICD10-J43.9). 4. Left adrenal adenoma.   These results will be called to the ordering clinician or representative by the Radiologist Assistant, and communication documented in the PACS or [employer].      CURRENT THERAPY:  Surveillance  INTERVAL HISTORY:  Vincent Mcclure is here for a follow up and clinic. He was last seen by me 6 months ago. He presents to the clinic alone. He notes he was able to drive himself but has trouble with walking so her used wheelchair to get to my office today. He notes he left cane at home today. He notes he did have a fall 3 days ago outside his home. He was picking tomatoes and tripped in the yard.  He notes he got both his COVID19 vaccines. He denies SOB, chest pain or persistent cough. He notes his energy level is stable. He can still so some yard work. He notes prior Thoracentesis due to pleural effusion. Her notes he smoked intermittently for 58 years. He notes he has 4 sons who lives in [place]. He notes his wife wound panic with the concern for cancer recurrence.     REVIEW OF SYSTEMS:   Constitutional: Denies fevers, chills or abnormal weight loss Eyes: Denies blurriness of vision Ears, nose, mouth, throat, and face: Denies mucositis or sore throat Respiratory: Denies cough, dyspnea or wheezes Cardiovascular: Denies palpitation, chest discomfort or lower extremity swelling Gastrointestinal:  Denies nausea, heartburn or change in bowel habits Skin: Denies abnormal skin rashes Lymphatics: Denies new lymphadenopathy  or easy bruising Neurological:Denies numbness, tingling or new weaknesses Behavioral/Psych: Mood is stable, no new changes  All other systems were reviewed with the patient  and are negative.  MEDICAL HISTORY:  Past Medical History:  Diagnosis Date  . Alcohol abuse   . Anemia   . Arthritis    hands  . Blood transfusion without reported diagnosis   . Cancer [facility])    colon cancer  . Chronic low back pain 10/14/2011  . Colon polyps    hyperplastic (2004, 2010) and adenomatous (1990).    . COLONIC POLYPS, HX OF 03/05/2008  . Esophageal stricture    hx of  . Gastric ulcer   . GERD 03/05/2008  . GERD (gastroesophageal reflux disease) 1994   associated peptic strictures  . HYPERLIPIDEMIA 03/05/2008  . HYPERTENSION 03/05/2008  . Hypertension   . IBS 03/05/2008  . Impaired glucose tolerance 10/12/2011  . Iron deficiency anemia   . LEG CRAMPS 09/02/2010  . Myocardial infarction [facility])    May 2018  . PAD (peripheral artery disease) ([place]) 10/14/2011  . PAD (peripheral artery disease) ([place]) 2013  . PERIPHERAL EDEMA 09/02/2010  . PERIPHERAL NEUROPATHY 03/05/2008  . Prostatitis    hx of  . VARICOSE VEINS, LOWER EXTREMITIES 06/09/2009    SURGICAL HISTORY: Past Surgical History:  Procedure Laterality Date  . CATARACT EXTRACTION  bilat  . COLECTOMY    . COLONOSCOPY    . CORONARY ARTERY BYPASS GRAFT N/A 02/15/2017   Procedure: CORONARY ARTERY BYPASS GRAFTING times three  with left internal mammary harvest and endoscopic harvest of Right SVG. Grafts of LIMA to  LAD, SVG to Distal Circ, and to First Diag.;  Surgeon: Grace Isaac, MD;  Location: [place];  Service: Open Heart Surgery;  Laterality: N/A;  . ESOPHAGOGASTRODUODENOSCOPY N/A 11/14/2014   Procedure: ESOPHAGOGASTRODUODENOSCOPY (EGD);  Surgeon: Jerene Bears, MD;  Location: [facility] ENDOSCOPY;  Service: Endoscopy;  Laterality: N/A;  . IR RADIOLOGIST EVAL & MGMT  07/04/2018  . IR RADIOLOGIST EVAL & MGMT  07/18/2018  . LAPAROTOMY N/A 05/19/2018   Procedure: EXPLORATORY LAPAROTOMY RIGHT COLECTOMY;  Surgeon: Fanny Skates, MD;  Location: [facility] ORS;  Service: General;  Laterality: N/A;  . LEFT HEART CATH AND CORONARY ANGIOGRAPHY  N/A 02/14/2017   Procedure: Left Heart Cath and Coronary Angiography;  Surgeon: Belva Crome, MD;  Location: [place] CV LAB;  Service: Cardiovascular;  Laterality: N/A;  . LUMBAR SPINE SURGERY  11/2008   Dr Joya Salm  . TEE WITHOUT CARDIOVERSION N/A 02/15/2017   Procedure: TRANSESOPHAGEAL ECHOCARDIOGRAM (TEE);  Surgeon: Grace Isaac, MD;  Location: [place];  Service: Open Heart Surgery;  Laterality: N/A;  . UPPER GASTROINTESTINAL ENDOSCOPY      I have reviewed the social history and family history with the patient and they are unchanged from previous note.  ALLERGIES:  is allergic to no known allergies.  MEDICATIONS:  Current Outpatient Medications  Medication Sig Dispense Refill  . acetaminophen (TYLENOL) 500 MG tablet Take 1 tablet (500 mg total) by mouth every 6 (six) hours as needed for mild pain.  0  . aspirin EC 81 MG EC tablet Take 1 tablet (81 mg total) by mouth daily. 30 tablet 1  . Cholecalciferol (VITAMIN D-3 PO) Take 2,000 Units by mouth daily.    . Ferrous Sulfate (IRON) 325 (65 Fe) MG TABS TAKE 1 TABLET BY MOUTH THREE TIMES DAILY WITH MEALS 90 tablet 0  . hydrochlorothiazide (HYDRODIURIL) 25 MG tablet Take 1 tablet (25 mg total)  by mouth daily. 90 tablet 2  . metoprolol succinate (TOPROL-XL) 50 MG 24 hr tablet Take 1 tablet (50 mg total) by mouth daily. Take with or immediately following a meal. 90 tablet 3  . nitroGLYCERIN (NITROSTAT) 0.4 MG SL tablet Place 1 tablet (0.4 mg total) under the tongue every 5 (five) minutes as needed for chest pain (or tightness). 25 tablet 3  . pravastatin (PRAVACHOL) 40 MG tablet TAKE 1 TABLET BY MOUTH AT BEDTIME 90 tablet 3   No current facility-administered medications for this visit.    PHYSICAL EXAMINATION: ECOG PERFORMANCE STATUS: 1 - Symptomatic but completely ambulatory  Vitals:   06/02/20 1049  BP: (!) 137/92  Pulse: 81  Resp: 18  Temp: (!) 97.3 F (36.3 C)  SpO2: 97%   Filed Weights   06/02/20 1049  Weight: 231 lb 4.8  oz (104.9 kg)    Due to COVID19 we will limit examination to appearance. Patient had no complaints.  GENERAL:alert, no distress and comfortable SKIN: skin color normal, no rashes or significant lesions EYES: normal, Conjunctiva are pink and non-injected, sclera clear  NEURO: alert & oriented x 3 with fluent speech   LABORATORY DATA:  I have reviewed the data as listed CBC Latest Ref Rng & Units 05/28/2020 12/05/2019 08/20/2019  WBC 4.0 - 10.5 K/uL 3.6(L) 5.0 4.8  Hemoglobin 13.0 - 17.0 g/dL 15.1 15.4 14.4  Hematocrit 39 - 52 % 45.0 46.7 43.1  Platelets 150 - 400 K/uL 162 159 180     CMP Latest Ref Rng & Units 05/28/2020 12/05/2019 08/20/2019  Glucose 70 - 99 mg/dL 145(H) 126(H) 117(H)  BUN 8 - 23 mg/dL 13 14 13   Creatinine 0.61 - 1.24 mg/dL 1.14 1.09 0.99  Sodium 135 - 145 mmol/L 136 139 138  Potassium 3.5 - 5.1 mmol/L 3.6 3.9 4.3  Chloride 98 - 111 mmol/L 98 104 105  CO2 22 - 32 mmol/L 29 26 24   Calcium 8.9 - 10.3 mg/dL 10.0 9.0 8.8(L)  Total Protein 6.5 - 8.1 g/dL 6.5 6.7 6.7  Total Bilirubin 0.3 - 1.2 mg/dL 0.9 0.6 0.6  Alkaline Phos 38 - 126 U/L 93 88 86  AST 15 - 41 U/L 16 15 16   ALT 0 - 44 U/L 11 13 14       RADIOGRAPHIC STUDIES: I have personally reviewed the radiological images as listed and agreed with the findings in the report. No results found.   ASSESSMENT & PLAN:  Vincent Mcclure is a 86 y.o. male with    1. Adenocarcinoma of right colon, invasive adenocarcinoma, stage IIIB (pT4a, pN1, cM0), MSS -Diagnosed in 05/2018. Treated with hemicolectomy. HereceivedadjuvantXeloda 2000 mg BID one week on, one week offfrom10/21/2019 to 01/29/2019, he overall tolerated well. Continue cancer surveillance. -I personally reviewed and discussed his CT CAP from 05/30/20 with pt which shows Significant enlargement of a right lower lobe pulmonary nodule, now 1.7cm. Suspicious for primary bronchogenic carcinoma or metastatic cancer. No other evidence of metastatic disease from  colon cancer. Pt -He notes he did smoke intermittently for 58 years and is at risk for lung cancer .  -Given concern for metastatic disease, I recommend PET scan for further evaluation. He is agreeable. Based on scan will consider biopsy for definitive diagnosis.   -I discussed if he has no other metastatic disease, Surgery or Radiation would be his treatment options. I recommend Radiation given his advanced age. I will discuss this further on next visit after PET  -He is overall asymptomatic of  lung nodule. He has had recent fall. I recommend he ambulate with cane and use our Valet service when here.  -I will discuss his case in thoracic Tumor Board next week.  -I will f/u on 9/2 or 9/3   2. Anemia, due to chronic blood lossand iron deficiency -He is currently on oral iron.Improved after surgery -Anemiahas resolved. 05/28/20 Ferritin still pending.   3. CAD S/P CABG -f/u withDr Opalski and Dr Meda Coffee. -stable -He has been having occasional chest pain since surgery and uses nitroglycerin as needed. Overall occurred 3 times.  -BP has been mildly elevated and stable in clinic.   4. 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 -discuss in thoracic tumor board next week - F/u on 9/2 or 9/3 after scan    No problem-specific Assessment & Plan notes found for this encounter.   Orders Placed This Encounter  Procedures  . NM PET Image Initial (PI) Skull Base To Thigh    Standing Status:   Future    Standing Expiration Date:   06/02/2021    Order Specific Question:   If indicated for the ordered procedure, I authorize the administration of a radiopharmaceutical per Radiology protocol    Answer:   Yes    Order Specific Question:   Preferred imaging location?    Answer:   [facility]    Order Specific Question:   Radiology Contrast Protocol - do NOT remove file path     Answer:   [email or web address]   All questions were answered. The patient knows to call the clinic with any problems, questions or concerns. No barriers to learning was detected. The total time spent in the appointment was 30 minutes.     Truitt Merle, MD 06/03/2020   I, Joslyn Devon, am acting as scribe for Truitt Merle, MD.   I have reviewed the above documentation for accuracy and completeness, and I agree with the above.

## 2020-06-02 ENCOUNTER — Other Ambulatory Visit: Payer: Self-pay

## 2020-06-02 ENCOUNTER — Inpatient Hospital Stay: Payer: Medicare HMO | Admitting: Hematology

## 2020-06-02 VITALS — BP 137/92 | HR 81 | Temp 97.3°F | Resp 18 | Ht 71.0 in | Wt 231.3 lb

## 2020-06-02 DIAGNOSIS — C182 Malignant neoplasm of ascending colon: Secondary | ICD-10-CM | POA: Diagnosis not present

## 2020-06-02 DIAGNOSIS — J439 Emphysema, unspecified: Secondary | ICD-10-CM | POA: Diagnosis not present

## 2020-06-02 DIAGNOSIS — D3502 Benign neoplasm of left adrenal gland: Secondary | ICD-10-CM | POA: Diagnosis not present

## 2020-06-02 DIAGNOSIS — I700 Atherosclerosis of aorta: Secondary | ICD-10-CM | POA: Diagnosis not present

## 2020-06-02 DIAGNOSIS — G629 Polyneuropathy, unspecified: Secondary | ICD-10-CM | POA: Diagnosis not present

## 2020-06-02 DIAGNOSIS — R911 Solitary pulmonary nodule: Secondary | ICD-10-CM | POA: Diagnosis not present

## 2020-06-02 DIAGNOSIS — K219 Gastro-esophageal reflux disease without esophagitis: Secondary | ICD-10-CM | POA: Diagnosis not present

## 2020-06-02 DIAGNOSIS — E785 Hyperlipidemia, unspecified: Secondary | ICD-10-CM | POA: Diagnosis not present

## 2020-06-02 DIAGNOSIS — I2510 Atherosclerotic heart disease of native coronary artery without angina pectoris: Secondary | ICD-10-CM | POA: Diagnosis not present

## 2020-06-03 ENCOUNTER — Encounter: Payer: Self-pay | Admitting: Hematology

## 2020-06-03 ENCOUNTER — Telehealth: Payer: Self-pay | Admitting: Hematology

## 2020-06-03 ENCOUNTER — Telehealth: Payer: Self-pay

## 2020-06-03 NOTE — Telephone Encounter (Signed)
Scheduled per 8/23 los. Pt is aware of appt time and date.

## 2020-06-03 NOTE — Telephone Encounter (Signed)
I spoke with Mr Woolsey and let him know the date and time of his Pet scan.  I instructed him to have nothing but water after 0400 the day of the scan and to eat low carbs the night before.  He verbalized understanding.

## 2020-06-10 ENCOUNTER — Encounter (HOSPITAL_COMMUNITY): Admission: RE | Admit: 2020-06-10 | Payer: Medicare HMO | Source: Ambulatory Visit

## 2020-06-11 ENCOUNTER — Ambulatory Visit (HOSPITAL_COMMUNITY)
Admission: RE | Admit: 2020-06-11 | Discharge: 2020-06-11 | Disposition: A | Discharge status: Home / Self Care | Payer: Medicare HMO | Source: Ambulatory Visit | Attending: Hematology | Admitting: Hematology

## 2020-06-11 DIAGNOSIS — R911 Solitary pulmonary nodule: Secondary | ICD-10-CM | POA: Diagnosis not present

## 2020-06-11 DIAGNOSIS — I700 Atherosclerosis of aorta: Secondary | ICD-10-CM | POA: Diagnosis not present

## 2020-06-11 DIAGNOSIS — I2510 Atherosclerotic heart disease of native coronary artery without angina pectoris: Secondary | ICD-10-CM | POA: Diagnosis not present

## 2020-06-11 DIAGNOSIS — C182 Malignant neoplasm of ascending colon: Secondary | ICD-10-CM | POA: Insufficient documentation

## 2020-06-11 LAB — GLUCOSE, CAPILLARY: Glucose-Capillary: 116 mg/dL — ABNORMAL HIGH (ref 70–99)

## 2020-06-11 NOTE — Progress Notes (Addendum)
Vincent Mcclure   [phone] [phone]   Clinic Follow up Note   Patient Care Team: Lorrene Reid, PA-C as PCP - General Pyrtle, Lajuan Lines, MD as Consulting Physician (Gastroenterology) Allyn Kenner, MD as Consulting Physician (Dermatology) Dorothy Spark, MD as Consulting Physician (Cardiology) Levin Erp, [place] as Physician Assistant (Gastroenterology) Murrell Converse as Physician Assistant (Cardiology) 06/12/2020  CHIEF COMPLAINT: F/u history of stage III colon cancer, new lung nodule   SUMMARY OF ONCOLOGIC HISTORY: Oncology History Overview Note  Cancer Staging Cancer of ascending colon pT4apN1 s/p right colectomy 05/19/2018 Staging form: Colon and Rectum, AJCC 8th Edition - Pathologic stage from 05/19/2018: Stage IIIB (pT4a, pN1, cM0) - Signed by Truitt Merle, MD on 05/24/2018     Cancer of ascending colon pT4apN1 s/p right colectomy 05/19/2018  05/18/2018 Imaging   05/18/2018 CT AP IMPRESSION: 1. There appears to be a nearly obstructing lesion in the region of the hepatic flexure of the colon which is highly concerning for primary colonic neoplasm. Slight haziness of the surrounding soft tissues may be indicative of early local invasion. This appears partially obstructive as evidenced by dilatation of more proximal aspects of the small bowel and colon. 2. Left adrenal lesion is stable compared to prior studies, previously characterized as an adenoma. 3. Aortic atherosclerosis, in addition to left main and 3 vessel coronary artery disease. Status post median sternotomy for CABG. 4. Additional incidental findings, as above.  Aortic Atherosclerosis (ICD10-I70.0).   05/18/2018 - 05/30/2018 Hospital Admission   Admit date: 05/18/2018 Admission diagnosis: Colon Cancer Discharged on: 05/30/2018   05/19/2018 Cancer Staging   Staging form: Colon and Rectum, AJCC 8th Edition - Pathologic stage from 05/19/2018: Stage IIIB (pT4a, pN1, cM0) - Signed  by Truitt Merle, MD on 05/24/2018    05/19/2018 Pathology Results   05/19/2018 Surgical Pathology Diagnosis Colon, segmental resection for tumor, right - INVASIVE COLORECTAL ADENOCARCINOMA, 5 CM. - CARCINOMA FOCALLY LESS THAN 0.1 CM FROM SEROSAL SURFACE. - MARGINS NOT INVOLVED. - METASTATIC CARCINOMA IN ONE OF EIGHT LYMPH NODES (1/9). - BENIGN APPENDIX.   05/19/2018 Surgery   EXPLORATORY LAPAROTOMY with right colectomy with Fanny Skates, MD   05/24/2018 Initial Diagnosis   Cancer of right colon ([place])   06/12/2018 Imaging    06/12/2018 CT CAP IMPRESSION: 1. Evidence all bowel perforation/anastomosed breakdown with large volume intraperitoneal free air fluid in the RIGHT upper quadrant adjacent and above the liver. 2. Large loculated fluid collection in the RIGHT anterior pelvis extending along the LEFT pericolic gutter consists with PERITONEAL ABSCESS. 3. Of note, no large volume intraperitoneal free fluid. Oral contrast passed through the RIGHT colon anastomosis without evidence of leak. Findings could indicate a prior anastomotic breakdown which has healed in the interval with subsequent abscess formation from the prior leak / breakdown.   06/17/2018 Procedure   06/17/2018 US Thoracentesis IMPRESSION: Successful ultrasound guided right thoracentesis yielding 1 L of pleural fluid. No pneumothorax on post-procedure chest x-ray.  No malignancy on pathology   06/20/2018 Imaging   06/20/2018 CT AP IMPRESSION: 1. Decompression of right upper quadrant air and fluid collection after percutaneous catheter drainage. There remains a tiny rim of fluid as well as small extraluminal gas bubbles in this region suspicious for continued leak from bowel. 2. Further decrease in size of residual abscess fluid collection in the pelvis after percutaneous catheter drainage. There remains some fluid around the drainage catheter in the right pelvis.   07/04/2018 Imaging   07/04/2018 CT  AP IMPRESSION: 1. Further decompression of right perihepatic abscess with resolution of extraluminal air. 2. Relatively stable volume of fluid surrounding the right pelvic drainage catheter.   07/05/2018 Imaging   07/05/2018 CT AP IMPRESSION: 1. No change compared to the CT scan performed 9 hours earlier. 2. Anterior right perihepatic space collection is decompressed by indwelling percutaneous drain, with no residual measurable collection in this location. 3. Thick walled right lower quadrant collection with indwelling percutaneous drain is stable. No new focal fluid collections. 4. Stable postsurgical changes from right hemicolectomy with no evidence of bowel obstruction. Stable reactive wall thickening in the sigmoid colon adjacent to the right lower quadrant collection. 5. Stable small to moderate dependent right pleural effusion. 6. Stable left adrenal adenoma. 7.  Aortic Atherosclerosis (ICD10-I70.0).   07/31/2018 - 01/21/2019 Chemotherapy   Adjuvant Xeloda 2026m bid, 7 days on, 7 days off.  Reduced to 20057min the AM and 150045mn the PM due to diarrhea. Completed on 01/21/19    04/12/2019 Imaging   CT CAP W contrast 04/12/19  IMPRESSION: 1. Status post right hemicolectomy. 2. No definite metastatic disease in the chest, abdomen, or pelvis. 7 mm right lower lobe pulmonary nodule, obscured by atelectasis on previous chest CT and not included on multiple prior abdomen / pelvis CTs. Close follow-up recommended to ensure stability. 3.  Aortic Atherosclerois (ICD10-170.0)   09/04/2019 Imaging   CT AP W Contrast I MPRESSION: 1. No evidence of local colon cancer recurrence at the RIGHT hemicolectomy site. 2. No evidence of metastatic disease in the abdomen pelvis. 3. Mild nodularity at the LEFT lung base is favored benign. Recommend attention on routine surveillance 4. Aortic Atherosclerosis (ICD10-I70.0).   05/30/2020 Imaging   CT CAP w contrast IMPRESSION: 1. Significant  enlargement of a right lower lobe pulmonary nodule. Suspicious for primary bronchogenic carcinoma. Isolated pulmonary metastasis felt less likely. 2. Otherwise, no evidence of metastatic disease from patient's colon cancer. 3. Aortic atherosclerosis (ICD10-I70.0) and emphysema (ICD10-J43.9). 4. Left adrenal adenoma.   These results will be called to the ordering clinician or representative by the Radiologist Assistant, and communication documented in the PACS or [employer] 06/11/2020 PET scan   IMPRESSION: 1. The index nodule within the superior segment of the right lower lobe is FDG avid and worrisome for either primary bronchogenic neoplasm versus metastatic disease 2. Within the anteromedial aspect of the left maxillary sinus, in the region of the nasolacrimal duct, there is a focal soft tissue attenuating filling defect which exhibits intense FDG uptake within SUV max of 55.8. The although focal uptake within the paranasal sinuses may be seen with sinusitis primary neoplasm of the sinuses or nasal lacrimal duct cannot be excluded. In light of the intense FDG uptake in this area further investigation with contrast enhanced CT or MRI of the soft tissues of neck to include the maxillary sinuses is recommended. 3.  Aortic Atherosclerosis (ICD10-I70.0). 4. Coronary artery calcifications 5. Prior granulomatous disease.       CURRENT THERAPY: Surveillance   INTERVAL HISTORY: Mr. BroLemayturns for f/u as scheduled. He was last seen 06/02/20 and underwent PET scan in the interval.  He feels well in general.  He developed sternal chest tightness and left arm discomfort the morning of his PET scan, this resolved after 2 doses of sublingual nitro and an extra dose "as a kicker."  He does not have to take nitro often, usually every couple months.  Next cardiology follow-up on 10/6 with Dr. NelMeda Coffee  His productive cough is at baseline, has been told he has a "touch of COPD." Denies  fever, chills, shortness of breath.  Denies sinus congestion or maxillary pain.  He did have some discomfort in the left eye, went to a provider who noted it was normal.  Denies any pain, change in bowel habits from baseline constipation.  Denies other new concerns.  He remains able to complete ADLs independently, but is mostly sedentary otherwise.   MEDICAL HISTORY:  Past Medical History:  Diagnosis Date  . Alcohol abuse   . Anemia   . Arthritis    hands  . Blood transfusion without reported diagnosis   . Cancer [facility])    colon cancer  . Chronic low back pain 10/14/2011  . Colon polyps    hyperplastic (2004, 2010) and adenomatous (1990).    . COLONIC POLYPS, HX OF 03/05/2008  . Esophageal stricture    hx of  . Gastric ulcer   . GERD 03/05/2008  . GERD (gastroesophageal reflux disease) 1994   associated peptic strictures  . HYPERLIPIDEMIA 03/05/2008  . HYPERTENSION 03/05/2008  . Hypertension   . IBS 03/05/2008  . Impaired glucose tolerance 10/12/2011  . Iron deficiency anemia   . LEG CRAMPS 09/02/2010  . Myocardial infarction [facility])    May 2018  . PAD (peripheral artery disease) ([place]) 10/14/2011  . PAD (peripheral artery disease) ([place]) 2013  . PERIPHERAL EDEMA 09/02/2010  . PERIPHERAL NEUROPATHY 03/05/2008  . Prostatitis    hx of  . VARICOSE VEINS, LOWER EXTREMITIES 06/09/2009    SURGICAL HISTORY: Past Surgical History:  Procedure Laterality Date  . CATARACT EXTRACTION  bilat  . COLECTOMY    . COLONOSCOPY    . CORONARY ARTERY BYPASS GRAFT N/A 02/15/2017   Procedure: CORONARY ARTERY BYPASS GRAFTING times three  with left internal mammary harvest and endoscopic harvest of Right SVG. Grafts of LIMA to  LAD, SVG to Distal Circ, and to First Diag.;  Surgeon: Grace Isaac, MD;  Location: [place];  Service: Open Heart Surgery;  Laterality: N/A;  . ESOPHAGOGASTRODUODENOSCOPY N/A 11/14/2014   Procedure: ESOPHAGOGASTRODUODENOSCOPY (EGD);  Surgeon: Jerene Bears, MD;  [facility]COPY;   Service: Endoscopy;  Laterality: N/A;  . IR RADIOLOGIST EVAL & MGMT  07/04/2018  . IR RADIOLOGIST EVAL & MGMT  07/18/2018  . LAPAROTOMY N/A 05/19/2018   Procedure: EXPLORATORY LAPAROTOMY RIGHT COLECTOMY;  Surgeon: Fanny Skates, MD;  Location: [facility] OR[facility];  Service: General;  Laterality: N/A;  . LEFT HEART CATH AND CORONARY ANGIOGRAPHY N/A 02/14/2017   Procedure: Left Heart Cath and Coronary Angiography;  Surgeon: Belva Crome, MD;  Location: [place] CV LAB;  Service: Cardiovascular;  Laterality: N/A;  . LUMBAR SPINE SURGERY  11/2008   Dr Joya Salm  . TEE WITHOUT CARDIOVERSION N/A 02/15/2017   Procedure: TRANSESOPHAGEAL ECHOCARDIOGRAM (TEE);  Surgeon: Grace Isaac, MD;  Location: [place];  Service: Open Heart Surgery;  Laterality: N/A;  . UPPER GASTROINTESTINAL ENDOSCOPY      I have reviewed the social history and family history with the patient and they are unchanged from previous note.  ALLERGIES:  is allergic to no known allergies.  MEDICATIONS:  Current Outpatient Medications  Medication Sig Dispense Refill  . acetaminophen (TYLENOL) 500 MG tablet Take 1 tablet (500 mg total) by mouth every 6 (six) hours as needed for mild pain.  0  . aspirin EC 81 MG EC tablet Take 1 tablet (81 mg total) by mouth daily. 30 tablet 1  .  Cholecalciferol (VITAMIN D-3 PO) Take 2,000 Units by mouth daily.    . Ferrous Sulfate (IRON) 325 (65 Fe) MG TABS TAKE 1 TABLET BY MOUTH THREE TIMES DAILY WITH MEALS 90 tablet 0  . hydrochlorothiazide (HYDRODIURIL) 25 MG tablet Take 1 tablet (25 mg total) by mouth daily. 90 tablet 2  . metoprolol succinate (TOPROL-XL) 50 MG 24 hr tablet Take 1 tablet (50 mg total) by mouth daily. Take with or immediately following a meal. 90 tablet 3  . nitroGLYCERIN (NITROSTAT) 0.4 MG SL tablet Place 1 tablet (0.4 mg total) under the tongue every 5 (five) minutes as needed for chest pain (or tightness). 25 tablet 3  . pravastatin (PRAVACHOL) 40 MG tablet TAKE 1 TABLET BY MOUTH AT BEDTIME 90  tablet 3   No current facility-administered medications for this visit.    PHYSICAL EXAMINATION: ECOG PERFORMANCE STATUS: 2 - Symptomatic, <50% confined to bed   Vitals:   06/12/20 0920  BP: (!) 145/79  Pulse: 77  Resp: 18  Temp: (!) 97.4 F (36.3 C)  SpO2: 99%   Filed Weights   06/12/20 0920  Weight: 231 lb 3.2 oz (104.9 kg)    GENERAL:alert, no distress and comfortable SKIN: No rash to exposed skin EYES: sclera clear OROPHARYNX: No maxillary tenderness NECK: Without mass  LUNGS: Coarse breath sounds L>R with normal breathing effort HEART: regular rate & rhythm, no lower extremity edema ABDOMEN:abdomen soft, non-tender and normal bowel sounds NEURO: alert & oriented x 3 with fluent speech Exam performed in wheelchair  LABORATORY DATA:  I have reviewed the data as listed CBC Latest Ref Rng & Units 05/28/2020 12/05/2019 08/20/2019  WBC 4.0 - 10.5 K/uL 3.6(L) 5.0 4.8  Hemoglobin 13.0 - 17.0 g/dL 15.1 15.4 14.4  Hematocrit 39 - 52 % 45.0 46.7 43.1  Platelets 150 - 400 K/uL 162 159 180     CMP Latest Ref Rng & Units 05/28/2020 12/05/2019 08/20/2019  Glucose 70 - 99 mg/dL 145(H) 126(H) 117(H)  BUN 8 - 23 mg/dL _0 Creatinine 0.61 - 1.24 mg/dL 1.14 1.09 0.99  Sodium 135 - 145 mmol/L 136 139 138  Potassium 3.5 - 5.1 mmol/L 3.6 3.9 4.3  Chloride 98 - 111 mmol/L 98 104 105  CO2 22 - 32 mmol/L _1 Calcium 8.9 - 10.3 mg/dL 10.0 9.0 8.8(L)  Total Protein 6.5 - 8.1 g/dL 6.5 6.7 6.7  Total Bilirubin 0.3 - 1.2 mg/dL 0.9 0.6 0.6  Alkaline Phos 38 - 126 U/L 93 88 86  AST 15 - 41 U/L _2 ALT 0 - 44 U/L _3 RADIOGRAPHIC STUDIES: I have personally reviewed the radiological images as listed and agreed with the findings in the report. NM PET Image Initial (PI) Skull Base To Thigh  Result Date: 06/11/2020 CLINICAL DATA:  Initial treatment strategy for lung nodule. Past medical history of colon cancer status post resection. EXAM: NUCLEAR MEDICINE PET  SKULL BASE TO THIGH TECHNIQUE: 12.4 mCi F-18 FDG was injected intravenously. Full-ring PET imaging was performed from the skull base to thigh after the radiotracer. CT data was obtained and used for attenuation correction and anatomic localization. Fasting blood glucose: 116 mg/dl COMPARISON:  05/30/2020 FINDINGS: Mediastinal blood pool activity: SUV max 3.6 Liver activity: SUV max NA NECK: Within the anteromedial aspect of the left maxillary sinus, in the expected location of the left nasolacrimal duct, there is a 1.6 cm filling defect which has an SUV  max of 55.8 for, image 16/4. No FDG avid lymph nodes identified within the soft tissues of the neck. Incidental CT findings: none CHEST: No FDG avid axillary, supraclavicular, mediastinal, or hilar lymph nodes. The pulmonary nodule within the superior segment of the right lower lobe measures 1.7 cm and has an SUV max of 6.75. Incidental CT findings: none ABDOMEN/PELVIS: No abnormal hypermetabolic activity within the liver, pancreas, adrenal glands, or spleen. No hypermetabolic lymph nodes in the abdomen or pelvis. Incidental CT findings: Stable 1.8 cm left adrenal gland nodule which is favored to represent a benign adenoma. Calcified granulomas noted within the liver and spleen. Extensive aortic atherosclerosis with branch vessel disease. SKELETON: No focal hypermetabolic activity to suggest skeletal metastasis. Incidental CT findings: none IMPRESSION: 1. The index nodule within the superior segment of the right lower lobe is FDG avid and worrisome for either primary bronchogenic neoplasm versus metastatic disease 2. Within the anteromedial aspect of the left maxillary sinus, in the region of the nasolacrimal duct, there is a focal soft tissue attenuating filling defect which exhibits intense FDG uptake within SUV max of 55.8. The although focal uptake within the paranasal sinuses may be seen with sinusitis primary neoplasm of the sinuses or nasal lacrimal duct  cannot be excluded. In light of the intense FDG uptake in this area further investigation with contrast enhanced CT or MRI of the soft tissues of neck to include the maxillary sinuses is recommended. 3.  Aortic Atherosclerosis (ICD10-I70.0). 4. Coronary artery calcifications 5. Prior granulomatous disease. . Electronically Signed   By: Kerby Moors M.D.   On: 06/11/2020 15:59     ASSESSMENT & PLAN: LESS WOOLSEY a86 y.o.malewithpast medical history of CABG and back surgery, presented with acute abdominal pain and nausea,was found to have colonicbowel obstruction.   1. Adenocarcinoma of right colon, invasive adenocarcinoma, stage IIIB (pT4a, pN1, cM0), MSS -Diagnosed in 05/2028.  S/p hemicolectomy and adjuvant Xeloda from 07/2018-01/2019 with overall good tolerance.  On colon cancer surveillance since then  -CT on 05/30/2020 shows significant enlargement of a right lower lobe pulmonary nodule now 1.7 cm suspicious for primary bronchogenic carcinoma versus metastatic disease. -He underwent PET scan on 06/11/2020 which we reviewed today, shows hypermetabolism in the right lower lobe nodule, highly suspicious for malignancy, and incidental finding of intense radiotracer uptake in the left maxillary sinus near the nasolacrimal duct.  He has no clinical evidence of sinusitis. -Case was discussed in thoracic tumor board this morning and the recommendation is to proceed with CT-guided biopsy for tissue diagnosis.  If malignancy is confirmed, we reviewed potential treatment options depending on final path include surgical resection or radiation likely SBRT.  He is being referred to both surgery and rad onc. -Given his cardiac history and recent chest pain episode requiring nitroglycerin he is being referred back to Dr. Meda Coffee for cardiac clearance prior to biopsy and/or surgery if that is being considered -We will follow his case and see him back after he completes treatment.  The patient was seen with  Dr. [name]  2. Anemia, like due to chronic blood loss and iron deficiency -On oral iron therapy.  Anemia resolved    3. CAD S/P CABG -continue f/u with Dr. Raliegh Scarlet and Dr. Meda Coffee -He had a chest pain episode prior to PET scan required 3 doses of sublingual nitro and symptoms resolved.  He will likely need cardiac clearance before procedures    PLAN: -Labs, PET reviewed -Referral to IR for RLL biopsy  -Referrals to  thoracic surgery and rad onc after biopsy  -Will CC note to Dr. Meda Coffee for sooner f/u (10/6 as of now) -Will follow along, f/u after treatment    Orders Placed This Encounter  Procedures  . CT Biopsy    Standing Status:   Future    Standing Expiration Date:   06/12/2021    Order Specific Question:   Lab orders requested (DO NOT place separate lab orders, these will be automatically ordered during procedure specimen collection):    Answer:   Surgical Pathology    Order Specific Question:   Reason for Exam (SYMPTOM  OR DIAGNOSIS REQUIRED)    Answer:   Dr. Pascal Lux; enlarging RLL nodule r/o met colon cancer vs primary bronchogenic cancer    Order Specific Question:   Preferred location?    Answer:   [facility]    Order Specific Question:   Radiology Contrast Protocol - do NOT remove file path    Answer:   [email or web address]  . Ambulatory referral to Interventional Radiology    Referral Priority:   Routine    Referral Type:   Consultation    Referral Reason:   Specialty Services Required    Referred to Provider:   [name], MD    Requested Specialty:   Interventional Radiology    Number of Visits Requested:   1   All questions were answered. The patient knows to call the clinic with any problems, questions or concerns. No barriers to learning were detected.     Alla Feeling, NP 06/12/20   Addendum  I have seen the patient, examined him. I agree with the assessment and and plan and have edited the notes.   I have  personally reviewed his recent PET scan images and presented his case in our thoracic conference this morning.  The right lower lobe lung mass is hypermetabolic on PET scan, no evidence of nodal or other metastasis.  This is highly suspicious for primary lung cancer or multiple metastasis from his previous colon cancer.  IR biopsy is recommended.  Due to his significant cardiovascular disease, and recent chest pain, cardiac clearance before biopsy is recommended by Dr. Pascal Lux.  We will send a message to his cardiologist Dr. Meda Coffee.  I discussed the option of surgery versus SBRT. I do not plan to offer adjuvant chemotherapy. Given he is elderly and overall poor health, I do not think he is a good candidate for surgery.  Will refer him to cardiothoracic surgeon Dr. Roxan Hockey and radiation oncologist Dr. Lisbeth Renshaw for further evaluation. Pt voiced good understanding and agrees with the plan.   Truitt Merle  06/13/2020

## 2020-06-11 NOTE — Progress Notes (Signed)
Upon arrival to Radiology for PET scan this AM patient reported taking several doses of NO this morning for chest pain. Denies chest pain currently.  States he feels well and does not think he needs to be seen in the ED.  States that he has a history of chest pain and follows a Film/video editor.  Reports he was given prescription for NO by his Cardiologist for use at home.  Does not need to use it often.  Reports he believes he had indigestion this AM, but used the NO because the discomfort was in his chest.  Reports he took 2 doses and chest pain was relieved. Took an additional dose "as a kicker" to keep chest pain from coming back.  Appears well this AM.  Converses easily.  No shortness of breath. No current chest pain.  No additional complaints. Ambulates without difficulty.    Instructed patient to call his Cardiologist this afternoon to notify of today's events.   Brynda Greathouse, MS RD PA-C

## 2020-06-12 ENCOUNTER — Encounter (HOSPITAL_COMMUNITY): Payer: Self-pay | Admitting: Radiology

## 2020-06-12 ENCOUNTER — Encounter: Payer: Self-pay | Admitting: Nurse Practitioner

## 2020-06-12 ENCOUNTER — Inpatient Hospital Stay: Payer: Medicare HMO | Attending: Nurse Practitioner | Admitting: Nurse Practitioner

## 2020-06-12 ENCOUNTER — Other Ambulatory Visit: Payer: Self-pay | Admitting: *Deleted

## 2020-06-12 ENCOUNTER — Other Ambulatory Visit: Payer: Self-pay

## 2020-06-12 VITALS — BP 145/79 | HR 77 | Temp 97.4°F | Resp 18 | Ht 71.0 in | Wt 231.2 lb

## 2020-06-12 DIAGNOSIS — G620 Drug-induced polyneuropathy: Secondary | ICD-10-CM | POA: Diagnosis not present

## 2020-06-12 DIAGNOSIS — R911 Solitary pulmonary nodule: Secondary | ICD-10-CM

## 2020-06-12 DIAGNOSIS — D649 Anemia, unspecified: Secondary | ICD-10-CM | POA: Insufficient documentation

## 2020-06-12 DIAGNOSIS — I2510 Atherosclerotic heart disease of native coronary artery without angina pectoris: Secondary | ICD-10-CM | POA: Diagnosis not present

## 2020-06-12 DIAGNOSIS — C182 Malignant neoplasm of ascending colon: Secondary | ICD-10-CM | POA: Diagnosis not present

## 2020-06-12 DIAGNOSIS — Z85038 Personal history of other malignant neoplasm of large intestine: Secondary | ICD-10-CM | POA: Insufficient documentation

## 2020-06-12 NOTE — Progress Notes (Signed)
Vincent Mcclure Male, 86 y.o., 1931/11/22 MRN:  [id number] Phone:  [phone] [name] PCP:  Lorrene Reid, PA-C Coverage:  [employer] Medicare/[employer] [place] With Cardiology 07/16/2020 at 8:40 AM  RE: CT Biopsy Received: Today [name], MD  [name] OK for CT guided Bx of R LL pulmonary nodule - please schedule at [facility].   PET CT image 74, series 4.   [name]       Previous Messages   ----- Message -----  From: [name]  Sent: 06/12/2020  5:15 PM EDT  To: [name], MD, [place] Procedure Requests  Subject: CT Biopsy                     Procedure:  CT Biopsy   Reason: Right lower lobe pulmonary nodule, Dr. Pascal Lux; enlarging RLL nodule r/o met colon cancer vs primary bronchogenic cancer   History:  NM PET, CT in computer   Provider: Alla Feeling   Provider Contact: [phone]

## 2020-06-12 NOTE — Progress Notes (Signed)
The proposed treatment discussed in cancer conference is for discussion purpose only and is not a binding recommendation. The patient was not physically examined nor present for their treatment options. Therefore, final treatment plans cannot be decided.  ?

## 2020-06-13 ENCOUNTER — Encounter: Payer: Self-pay | Admitting: Nurse Practitioner

## 2020-06-17 ENCOUNTER — Telehealth: Payer: Self-pay | Admitting: *Deleted

## 2020-06-17 NOTE — Telephone Encounter (Signed)
-----   Message from Rivka Barbara sent at 06/17/2020  8:15 AM EDT ----- Good morning,  Done! ----- Message ----- From: Nuala Alpha, LPN Sent: [birth year]   7:59 AM EDT To: Jolee Ewing, can you add this pt and call him for 9/9 at 10 am per Dr. Meda Coffee?  I so extremely appreciate you.  Thanks, Karlene Einstein  ----- Message ----- From: Dorothy Spark, MD Sent: 06/13/2020  11:56 AM EDT To: [name], MD, Nuala Alpha, LPN, #  I can see him on Thursday 9/9 at 10 am, Karlene Einstein could you add him to the schedule?Thank you, KN ----- Message ----- From: Alla Feeling, NP Sent: 06/12/2020   5:10 PM EDT To: [name], MD, Dorothy Spark, MD, #  Dr. Meda Coffee,  Could you consider moving his next f/u with you sooner for cardiac clearance for upcoming procedures including lung biopsy. If this is malignant we plan to refer for surgical opinion vs radiation.   Thank you, Lacie  ----- Message ----- From: [name], MD Sent: 06/12/2020   5:08 PM EDT To: Alla Feeling, NP, Truitt Merle, MD  I agree with Dr. Roxan Hockey.  I would recommend he gets worked up by Cardiology before any intervention is pursued.  [name] ----- Message ----- From: Alla Feeling, NP Sent: 06/12/2020   9:50 AM EDT To: [name], MD, Truitt Merle, MD  He had chest pain episode before PET scan, resolved with 2 doses nitro. He says this does not occur often. Next cardiology f/u 10/6 with Dr. Meda Coffee but can call to move up.  Thanks, Regan Rakers  ----- Message ----- From: Truitt Merle, MD Sent: 06/12/2020   7:50 AM EDT To: [name], MD, Alla Feeling, NP  Ulice Dash,  This is the case we discussed in thoracic conference today, please review his PET and let me know if you approve CT guided right lung mass biopsy. Do you need cardiac clearance before biopsy?  Thanks much  [employer]

## 2020-06-17 NOTE — Telephone Encounter (Signed)
Pt is scheduled to see Dr. Meda Coffee in the clinic for 06/19/20 at 10 am as Dr. Meda Coffee wanted.  Pt made aware of appt date and time by Dr. Francesca Oman scheduler.

## 2020-06-19 ENCOUNTER — Other Ambulatory Visit: Payer: Self-pay

## 2020-06-19 ENCOUNTER — Ambulatory Visit: Payer: Medicare HMO | Admitting: Cardiology

## 2020-06-19 ENCOUNTER — Encounter: Payer: Self-pay | Admitting: Cardiology

## 2020-06-19 ENCOUNTER — Encounter: Payer: Self-pay | Admitting: *Deleted

## 2020-06-19 VITALS — BP 138/94 | HR 89 | Ht 71.0 in | Wt 230.6 lb

## 2020-06-19 DIAGNOSIS — Z951 Presence of aortocoronary bypass graft: Secondary | ICD-10-CM

## 2020-06-19 DIAGNOSIS — R072 Precordial pain: Secondary | ICD-10-CM | POA: Diagnosis not present

## 2020-06-19 DIAGNOSIS — I200 Unstable angina: Secondary | ICD-10-CM

## 2020-06-19 DIAGNOSIS — I10 Essential (primary) hypertension: Secondary | ICD-10-CM | POA: Diagnosis not present

## 2020-06-19 DIAGNOSIS — E782 Mixed hyperlipidemia: Secondary | ICD-10-CM | POA: Diagnosis not present

## 2020-06-19 DIAGNOSIS — I480 Paroxysmal atrial fibrillation: Secondary | ICD-10-CM

## 2020-06-19 DIAGNOSIS — I25810 Atherosclerosis of coronary artery bypass graft(s) without angina pectoris: Secondary | ICD-10-CM

## 2020-06-19 DIAGNOSIS — Z01810 Encounter for preprocedural cardiovascular examination: Secondary | ICD-10-CM | POA: Diagnosis not present

## 2020-06-19 MED ORDER — ISOSORBIDE MONONITRATE ER 30 MG PO TB24: 15.0000 mg | ORAL_TABLET | Freq: Every day | ORAL | 2 refills | Status: DC

## 2020-06-19 NOTE — Progress Notes (Signed)
06/19/2020 Vincent Mcclure   [birth year]  [id number]  Primary Physician Patient, No Pcp Per Primary Cardiologist: Dr. Meda Coffee  Electrophysiologist: None   Reason for Visit/CC: Chest Pain  HPI:  Vincent Mcclure is a 86 y.o. male Vincent Mcclure is a 86 y.o. male with history of CAD, hypertension, hyperlipidemia, gastric ulcers with big GI bleed in 2016 secondary to alcohol and previous tobacco abuse.  He also has history of colon cancer s/p hemicolectomy in 5102 with complications and prolonged hospitalizations for 2 months..  He was admitted with a non-STEMI in 2018 and found to have multivessel CAD and preserved LV function on cardiac cath. He underwent CABG x3 on Feb 15, 2017.  He had postoperative atrial fibrillation that was treated with amiodarone and he converted to normal sinus rhythm.  CHA2DS2 VASc score =4 but HAS-BLED score was 5 so no anticoagulation at that time per Dr. Sallyanne Kuster.  Bleed risk exceeds potential stroke reduction benefit.  He was previously treated and followed by Dr. [name] for colon cancer, status post resection with last chemo in April 2020.  He underwent whole body PET scanning on June 11, 2020 with finding of new worrisome nodule in his right lower lobe suspicious for primary bronchogenic neoplasm versus metastatic disease.  He is scheduled for lung biopsy on June 25, 2020.  The patient states that he has been experiencing chest pain, they're happening more frequently and they last longer, on one occasion last week he had to take 3 sublingual nitroglycerin to relieve the pressure.  He has also been feeling more short of breath.  Denies any lower extremity edema orthopnea or proximal nocturnal dyspnea.  He hasn't been taking aspirin as he has history of GI bleed.  Cardiac Studies   2D echocardiogram 06/03/2018 Study Conclusions  - Left ventricle: The cavity size was normal. Wall thickness was   increased in a pattern of mild LVH. Systolic function was normal.    The estimated ejection fraction was in the range of 55% to 60%.   Wall motion was normal; there were no regional wall motion   abnormalities. Doppler parameters are consistent with abnormal   left ventricular relaxation (grade 1 diastolic dysfunction). - Aortic valve: Mildly calcified annulus. Trileaflet; mildly   thickened leaflets. Valve area (VTI): 4.6 cm^2. Valve area   (Vmax): 4.22 cm^2. - Right atrium: There is a 1.2 by 1.6 cm echodense mass in the   right atrium, overall poorly visualized but best seen in the   apical 4 chamber views. The subcostal views are limited . When   reviewing the 02/15/17 TEE this area is also poorly visualized, but   does appear to be due to a prominent eustachian valve, a benign   finding. - Pulmonary arteries: Systolic pressure was mildly increased. PA   peak pressure: 37 mm Hg (S). - Systemic veins: IVC is small suggesting low RA pressure and   hypovolemia.  No outpatient medications have been marked as taking for the 06/19/20 encounter (Office Visit) with Dorothy Spark, MD.   No Known Allergies Past Medical History:  Diagnosis Date  . Alcohol abuse   . Anemia   . Arthritis    hands  . Blood transfusion without reported diagnosis   . Cancer [facility])    colon cancer  . Chronic low back pain 10/14/2011  . Colon polyps    hyperplastic (2004, 2010) and adenomatous (1990).    . COLONIC POLYPS, HX OF 03/05/2008  . Esophageal stricture  hx of  . Gastric ulcer   . GERD 03/05/2008  . GERD (gastroesophageal reflux disease) 1994   associated peptic strictures  . HYPERLIPIDEMIA 03/05/2008  . HYPERTENSION 03/05/2008  . Hypertension   . IBS 03/05/2008  . Impaired glucose tolerance 10/12/2011  . Iron deficiency anemia   . LEG CRAMPS 09/02/2010  . Myocardial infarction [facility])    May 2018  . PAD (peripheral artery disease) ([place]) 10/14/2011  . PAD (peripheral artery disease) ([place]) 2013  . PERIPHERAL EDEMA 09/02/2010  . PERIPHERAL NEUROPATHY 03/05/2008  .  Prostatitis    hx of  . VARICOSE VEINS, LOWER EXTREMITIES 06/09/2009   Family History  Problem Relation Age of Onset  . Cancer Mother        Brain Cancer  . Diabetes Father   . Heart disease Father        CAD  . Hypertension Father   . Stroke Father   . Diabetes Sister   . Stomach cancer Neg Hx   . Pancreatic cancer Neg Hx   . Colon cancer Neg Hx   . Esophageal cancer Neg Hx   . Rectal cancer Neg Hx    Past Surgical History:  Procedure Laterality Date  . CATARACT EXTRACTION  bilat  . COLECTOMY    . COLONOSCOPY    . CORONARY ARTERY BYPASS GRAFT N/A 02/15/2017   Procedure: CORONARY ARTERY BYPASS GRAFTING times three  with left internal mammary harvest and endoscopic harvest of Right SVG. Grafts of LIMA to  LAD, SVG to Distal Circ, and to First Diag.;  Surgeon: Grace Isaac, MD;  Location: [place] [place];  Service: Open Heart Surgery;  Laterality: N/A;  . ESOPHAGOGASTRODUODENOSCOPY N/A 11/14/2014   Procedure: ESOPHAGOGASTRODUODENOSCOPY (EGD);  Surgeon: Jerene Bears, MD;  Location: [facility] ENDOSCOPY;  Service: Endoscopy;  Laterality: N/A;  . IR RADIOLOGIST EVAL & MGMT  07/04/2018  . IR RADIOLOGIST EVAL & MGMT  07/18/2018  . LAPAROTOMY N/A 05/19/2018   Procedure: EXPLORATORY LAPAROTOMY RIGHT COLECTOMY;  Surgeon: Fanny Skates, MD;  Location: [facility];  Service: General;  Laterality: N/A;  . LEFT HEART CATH AND CORONARY ANGIOGRAPHY N/A 02/14/2017   Procedure: Left Heart Cath and Coronary Angiography;  Surgeon: Belva Crome, MD;  Location: [place] [facility] LAB;  Service: Cardiovascular;  Laterality: N/A;  . LUMBAR SPINE SURGERY  11/2008   Dr Joya Salm  . TEE WITHOUT CARDIOVERSION N/A 02/15/2017   Procedure: TRANSESOPHAGEAL ECHOCARDIOGRAM (TEE);  Surgeon: Grace Isaac, MD;  Location: [place];  Service: Open Heart Surgery;  Laterality: N/A;  . UPPER GASTROINTESTINAL ENDOSCOPY     Social History   Socioeconomic History  . Marital status: Married    Spouse name: Not on file  . Number of children: 4  .  Years of education: Not on file  . Highest education level: Not on file  Occupational History  . Occupation: Retired Engineer, maintenance  Tobacco Use  . Smoking status: Former Smoker    Packs/day: 1.25    Years: 50.00    Pack years: 62.50    Types: Cigarettes    Quit date: 11/11/2008    Years since quitting: 11.6  . Smokeless tobacco: Former Systems developer    Types: Secondary school teacher  . Vaping Use: Never used  Substance and Sexual Activity  . Alcohol use: Yes    Alcohol/week: 6.0 standard drinks    Types: 2 Glasses of wine, 4 Cans of beer per week    Comment: vodka  . Drug use: No  .  Sexual activity: Never  Other Topics Concern  . Not on file  Social History Narrative   ** Merged History Encounter **       4 sons   Social Determinants of Health   Financial Resource Strain:   . Difficulty of Paying Living Expenses: Not on file  Food Insecurity:   . Worried About Charity fundraiser in the Last Year: Not on file  . Ran Out of Food in the Last Year: Not on file  Transportation Needs:   . Lack of Transportation (Medical): Not on file  . Lack of Transportation (Non-Medical): Not on file  Physical Activity:   . Days of Exercise per Week: Not on file  . Minutes of Exercise per Session: Not on file  Stress:   . Feeling of Stress : Not on file  Social Connections:   . Frequency of Communication with Friends and Family: Not on file  . Frequency of Social Gatherings with Friends and Family: Not on file  . Attends Religious Services: Not on file  . Active Member of Clubs or Organizations: Not on file  . Attends Archivist Meetings: Not on file  . Marital Status: Not on file  Intimate Partner Violence:   . Fear of Current or Ex-Partner: Not on file  . Emotionally Abused: Not on file  . Physically Abused: Not on file  . Sexually Abused: Not on file    Lipid Panel     Component Value Date/Time   CHOL 133 04/24/2019 0917   TRIG 169 (H) 04/24/2019 0917   HDL 47 04/24/2019 0917    CHOLHDL 2.8 04/24/2019 0917   CHOLHDL 3.4 02/12/2017 0657   VLDL 17 02/12/2017 0657   LDLCALC 52 04/24/2019 0917   LDLDIRECT 64 04/24/2019 0917   LDLDIRECT 133.2 10/14/2011 1106   Review of Systems: General: negative for chills, fever, night sweats or weight changes.  Cardiovascular: negative for chest pain, dyspnea on exertion, edema, orthopnea, palpitations, paroxysmal nocturnal dyspnea or shortness of breath Dermatological: negative for rash Respiratory: negative for cough or wheezing Urologic: negative for hematuria Abdominal: negative for nausea, vomiting, diarrhea, bright red blood per rectum, melena, or hematemesis Neurologic: negative for visual changes, syncope, or dizziness All other systems reviewed and are otherwise negative except as noted above.  Physical Exam:  Height 5\' 11"  (1.803 m), weight 230 lb 9.6 oz (104.6 kg).  General appearance: alert, cooperative and no distress Neck: no carotid bruit and no JVD Lungs: clear to auscultation bilaterally Heart: regular rate and rhythm, S1, S2 normal, no murmur, click, rub or gallop Extremities: extremities normal, atraumatic, no cyanosis or edema Pulses: 2+ and symmetric Skin: Skin color, texture, turgor normal. No rashes or lesions Neurologic: Grossly normal  EKG sinus rhythm, normal ECG, no change. Personally reviewed.    ASSESSMENT AND PLAN:   1. CAD status post CABG 02/15/17.  The patient has new symptoms suspicious for unstable angina and he also needs cardiac clearance for possible lung surgery, will obtain Lexiscan nuclear stress test evaluate for new obstructive disease.  He was intolerant to aspirin, will start Plavix 75 mg daily but not until after lung biopsy.  2. PAF postop maintaining normal sinus rhythm, now off amiodarone.  Denies any palpitations.  3. Essential hypertension - slightly elevated, I will add Imdur 15 mg daily.  4. Mixed hyperlipidemia - continue pravastatin, he is tolerating it well.  5.   Lower extremity edema -resolved with hydrochlorothiazide.  6. Adenocarcinoma of right colon, invasive  adenocarcinoma, stage IIIB (pT4a, pN1, cM0), MSS, followed by Dr. [name] -Diagnosed in 05/2018. Treated with hemicolectomy. HereceivedadjuvantXeloda 2000 mg BID one week on, one week offfrom10/21/2019 to 01/29/2019.  7.  New lung nodule in the right lower lobe suspicious for either metastatic disease or primary bronchogenic neoplasm, he is scheduled for lung biopsy on 06/25/2020.  Follow-up in 4 months.  Ena Dawley , MD [facility] 06/19/2020 9:56 AM

## 2020-06-19 NOTE — Patient Instructions (Signed)
Medication Instructions:   START TAKING ISOSORBIDE MONONITRATE (IMDUR) 15 MG BY MOUTH DAILY  *If you need a refill on your cardiac medications before your next appointment, please call your pharmacy*   Testing/Procedures:  Your physician has requested that you have a lexiscan myoview. For further information please visit [email or web address]. Please follow instruction sheet, as given.  PER DR. Meda Coffee THIS NEEDS TO BE SCHEDULED FOR 06/20/20 OR 06/23/20 AND DO ON D-SPECT FOR CARDIAC CLEARANCE.    Follow-Up:  4-5 MONTHS IN THE OFFICE WITH DR. Meda Coffee

## 2020-06-20 ENCOUNTER — Ambulatory Visit (HOSPITAL_COMMUNITY): Payer: Medicare HMO | Attending: Cardiology

## 2020-06-20 ENCOUNTER — Telehealth: Payer: Self-pay | Admitting: Cardiology

## 2020-06-20 ENCOUNTER — Other Ambulatory Visit: Payer: Self-pay | Admitting: Hematology

## 2020-06-20 DIAGNOSIS — I25810 Atherosclerosis of coronary artery bypass graft(s) without angina pectoris: Secondary | ICD-10-CM | POA: Insufficient documentation

## 2020-06-20 DIAGNOSIS — R072 Precordial pain: Secondary | ICD-10-CM

## 2020-06-20 DIAGNOSIS — Z951 Presence of aortocoronary bypass graft: Secondary | ICD-10-CM | POA: Insufficient documentation

## 2020-06-20 LAB — MYOCARDIAL PERFUSION IMAGING
LV dias vol: 53 mL (ref 62–150)
LV sys vol: 22 mL
Peak HR: 98 {beats}/min
Rest HR: 78 {beats}/min
SDS: 0
SRS: 0
SSS: 0
TID: 1.03

## 2020-06-20 MED ORDER — TECHNETIUM TC 99M TETROFOSMIN IV KIT
32.8000 | PACK | Freq: Once | INTRAVENOUS | Status: AC | PRN
  Administered 2020-06-20: 32.8 via INTRAVENOUS
  Filled 2020-06-20: qty 33

## 2020-06-20 MED ORDER — TECHNETIUM TC 99M TETROFOSMIN IV KIT
9.9000 | PACK | Freq: Once | INTRAVENOUS | Status: AC | PRN
  Administered 2020-06-20: 9.9 via INTRAVENOUS
  Filled 2020-06-20: qty 10

## 2020-06-20 MED ORDER — REGADENOSON 0.4 MG/5ML IV SOLN
0.4000 mg | Freq: Once | INTRAVENOUS | Status: AC
  Administered 2020-06-20: 0.4 mg via INTRAVENOUS

## 2020-06-20 NOTE — Telephone Encounter (Signed)
Message Received: Today Dorothy Spark, MD  Nuala Alpha, LPN Normal stress test, he can proceed with lung biopsy on 9/15       Myocardial Perfusion Imaging    The patient has been notified of the result and verbalized understanding.  All questions (if any) were answered. Nuala Alpha, LPN 9/62/2297 9:89 PM   Will route normal stress test result and this telephone encounter, to pts Oncololgist, Dr. [name].

## 2020-06-20 NOTE — Telephone Encounter (Signed)
Follow up:     Patient returning a call back concering his results.

## 2020-06-23 ENCOUNTER — Other Ambulatory Visit (HOSPITAL_COMMUNITY)
Admission: RE | Admit: 2020-06-23 | Discharge: 2020-06-23 | Disposition: A | Discharge status: Home / Self Care | Payer: Medicare HMO | Source: Ambulatory Visit | Attending: Nurse Practitioner | Admitting: Nurse Practitioner

## 2020-06-23 DIAGNOSIS — Z01812 Encounter for preprocedural laboratory examination: Secondary | ICD-10-CM | POA: Diagnosis not present

## 2020-06-23 DIAGNOSIS — Z20822 Contact with and (suspected) exposure to covid-19: Secondary | ICD-10-CM | POA: Insufficient documentation

## 2020-06-24 ENCOUNTER — Other Ambulatory Visit: Payer: Self-pay | Admitting: Student

## 2020-06-24 LAB — SARS CORONAVIRUS 2 (TAT 6-24 HRS): SARS Coronavirus 2: NEGATIVE

## 2020-06-25 ENCOUNTER — Telehealth: Payer: Self-pay

## 2020-06-25 ENCOUNTER — Ambulatory Visit (HOSPITAL_COMMUNITY): Admission: RE | Admit: 2020-06-25 | Payer: Medicare HMO | Source: Ambulatory Visit

## 2020-06-25 NOTE — Telephone Encounter (Signed)
Mr Proia left vm stating his biopsy today had has to be rescheduled. He cancelled his appt for Friday.  He will call when biopsy rescheduled to schedule fu appt.

## 2020-06-27 ENCOUNTER — Ambulatory Visit: Payer: Medicare HMO | Admitting: Hematology

## 2020-07-01 ENCOUNTER — Other Ambulatory Visit: Payer: Self-pay | Admitting: Student

## 2020-07-02 ENCOUNTER — Ambulatory Visit (HOSPITAL_COMMUNITY)
Admission: RE | Admit: 2020-07-02 | Discharge: 2020-07-02 | Disposition: A | Discharge status: Home / Self Care | Payer: Medicare HMO | Source: Ambulatory Visit | Attending: Nurse Practitioner | Admitting: Nurse Practitioner

## 2020-07-02 ENCOUNTER — Other Ambulatory Visit: Payer: Self-pay

## 2020-07-02 ENCOUNTER — Ambulatory Visit (HOSPITAL_COMMUNITY)
Admission: RE | Admit: 2020-07-02 | Discharge: 2020-07-02 | Disposition: A | Discharge status: Home / Self Care | Payer: Medicare HMO | Source: Ambulatory Visit | Attending: Interventional Radiology | Admitting: Interventional Radiology

## 2020-07-02 DIAGNOSIS — C3431 Malignant neoplasm of lower lobe, right bronchus or lung: Secondary | ICD-10-CM | POA: Diagnosis not present

## 2020-07-02 DIAGNOSIS — I739 Peripheral vascular disease, unspecified: Secondary | ICD-10-CM | POA: Diagnosis not present

## 2020-07-02 DIAGNOSIS — J9811 Atelectasis: Secondary | ICD-10-CM | POA: Diagnosis not present

## 2020-07-02 DIAGNOSIS — K219 Gastro-esophageal reflux disease without esophagitis: Secondary | ICD-10-CM | POA: Diagnosis not present

## 2020-07-02 DIAGNOSIS — M545 Low back pain: Secondary | ICD-10-CM | POA: Insufficient documentation

## 2020-07-02 DIAGNOSIS — Z79899 Other long term (current) drug therapy: Secondary | ICD-10-CM | POA: Diagnosis not present

## 2020-07-02 DIAGNOSIS — Z9049 Acquired absence of other specified parts of digestive tract: Secondary | ICD-10-CM | POA: Diagnosis not present

## 2020-07-02 DIAGNOSIS — I700 Atherosclerosis of aorta: Secondary | ICD-10-CM | POA: Diagnosis not present

## 2020-07-02 DIAGNOSIS — I10 Essential (primary) hypertension: Secondary | ICD-10-CM | POA: Insufficient documentation

## 2020-07-02 DIAGNOSIS — Z951 Presence of aortocoronary bypass graft: Secondary | ICD-10-CM | POA: Insufficient documentation

## 2020-07-02 DIAGNOSIS — D509 Iron deficiency anemia, unspecified: Secondary | ICD-10-CM | POA: Insufficient documentation

## 2020-07-02 DIAGNOSIS — Z9889 Other specified postprocedural states: Secondary | ICD-10-CM

## 2020-07-02 DIAGNOSIS — R911 Solitary pulmonary nodule: Secondary | ICD-10-CM | POA: Diagnosis not present

## 2020-07-02 DIAGNOSIS — E785 Hyperlipidemia, unspecified: Secondary | ICD-10-CM | POA: Diagnosis not present

## 2020-07-02 DIAGNOSIS — G8929 Other chronic pain: Secondary | ICD-10-CM | POA: Diagnosis not present

## 2020-07-02 DIAGNOSIS — Z87891 Personal history of nicotine dependence: Secondary | ICD-10-CM | POA: Insufficient documentation

## 2020-07-02 DIAGNOSIS — I252 Old myocardial infarction: Secondary | ICD-10-CM | POA: Insufficient documentation

## 2020-07-02 DIAGNOSIS — Z85038 Personal history of other malignant neoplasm of large intestine: Secondary | ICD-10-CM | POA: Diagnosis not present

## 2020-07-02 LAB — CBC
HCT: 48.9 % (ref 39.0–52.0)
Hemoglobin: 16.2 g/dL (ref 13.0–17.0)
MCH: 29.9 pg (ref 26.0–34.0)
MCHC: 33.1 g/dL (ref 30.0–36.0)
MCV: 90.2 fL (ref 80.0–100.0)
Platelets: 191 10*3/uL (ref 150–400)
RBC: 5.42 MIL/uL (ref 4.22–5.81)
RDW: 13.2 % (ref 11.5–15.5)
WBC: 5.4 10*3/uL (ref 4.0–10.5)
nRBC: 0 % (ref 0.0–0.2)

## 2020-07-02 LAB — PROTIME-INR
INR: 1 (ref 0.8–1.2)
Prothrombin Time: 12.9 seconds (ref 11.4–15.2)

## 2020-07-02 MED ORDER — ACETAMINOPHEN 500 MG PO TABS
500.0000 mg | ORAL_TABLET | Freq: Once | ORAL | Status: AC
  Filled 2020-07-02: qty 1

## 2020-07-02 MED ORDER — MIDAZOLAM HCL 2 MG/2ML IJ SOLN
INTRAMUSCULAR | Status: AC
  Filled 2020-07-02: qty 4

## 2020-07-02 MED ORDER — FENTANYL CITRATE (PF) 100 MCG/2ML IJ SOLN
INTRAMUSCULAR | Status: AC
  Filled 2020-07-02: qty 2

## 2020-07-02 MED ORDER — MIDAZOLAM HCL 2 MG/2ML IJ SOLN
INTRAMUSCULAR | Status: AC | PRN
  Administered 2020-07-02 (×2): 0.5 mg via INTRAVENOUS
  Administered 2020-07-02: 1 mg via INTRAVENOUS

## 2020-07-02 MED ORDER — SODIUM CHLORIDE 0.9 % IV SOLN: INTRAVENOUS | Status: DC

## 2020-07-02 MED ORDER — ACETAMINOPHEN 500 MG PO TABS
ORAL_TABLET | ORAL | Status: AC
  Administered 2020-07-02: 500 mg via ORAL
  Filled 2020-07-02: qty 1

## 2020-07-02 MED ORDER — FENTANYL CITRATE (PF) 100 MCG/2ML IJ SOLN
INTRAMUSCULAR | Status: AC | PRN
Start: 2020-07-02 — End: 2020-07-02
  Administered 2020-07-02 (×3): 25 ug via INTRAVENOUS

## 2020-07-02 MED ORDER — SODIUM CHLORIDE 0.9 % IV SOLN
INTRAVENOUS | Status: AC | PRN
  Administered 2020-07-02: 10 mL/h via INTRAVENOUS

## 2020-07-02 NOTE — Progress Notes (Signed)
D/C instructions given to Judith(sp) and pt, verbalize understanding.  Will continue to monitor

## 2020-07-02 NOTE — Sedation Documentation (Signed)
Patient is resting comfortably. 

## 2020-07-02 NOTE — Discharge Instructions (Addendum)
Lung Biopsy, Care After This sheet gives you information about how to care for yourself after your procedure. Your health care provider may also give you more specific instructions depending on the type of biopsy you had. If you have problems or questions, contact your health care provider. What can I expect after the procedure? After the procedure, it is common to have:  A cough.  A sore throat.  Pain where a needle, bronchoscope, or incision was used to collect a biopsy sample (biopsy site). Follow these instructions at home: Medicines  Take over-the-counter and prescription medicines only as told by your health care provider.  Do not drink alcohol if your health care provider tells you not to drink.  Ask your health care provider if the medicine prescribed to you: ? Requires you to avoid driving or using heavy machinery. ? Can cause constipation. You may need to take these actions to prevent or treat constipation:  Drink enough fluid to keep your urine pale yellow.  Take over-the-counter or prescription medicines.  Eat foods that are high in fiber, such as beans, whole grains, and fresh fruits and vegetables.  Limit foods that are high in fat and processed sugars, such as fried or sweet foods.  Do not drive for 24 hours if you were given a sedative. Biopsy site care   Follow instructions from your health care provider about how to take care of your biopsy site. Make sure you: ? Wash your hands with soap and water before and after you change your bandage (dressing). If soap and water are not available, use hand sanitizer. ? Change your dressing as told by your health care provider. ? Leave stitches (sutures), skin glue, or adhesive strips in place. These skin closures may need to stay in place for 2 weeks or longer. If adhesive strip edges start to loosen and curl up, you may trim the loose edges. Do not remove adhesive strips completely unless your health care provider tells  you to do that.  Do not take baths, swim, or use a hot tub until your health care provider approves. Ask your health care provider if you may take showers. You may only be allowed to take sponge baths.  Check your biopsy site every day for signs of infection. Check for: ? Redness, swelling, or more pain. ? Fluid or blood. ? Warmth. ? Pus or a bad smell. General instructions  Return to your normal activities as told by your health care provider. Ask your health care provider what activities are safe for you.  It is up to you to get the results of your procedure. Ask your health care provider, or the department that is doing the procedure, when your results will be ready.  Keep all follow-up visits as told by your health care provider. This is important. Contact a health care provider if:  You have a fever.  You have redness, swelling, or more pain around your biopsy site.  You have fluid or blood coming from your biopsy site.  Your biopsy site feels warm to the touch.  You have pus or a bad smell coming from your biopsy site.  You have pain that does not get better with medicine. Get help right away if:  You cough up blood.  You have trouble breathing.  You have chest pain.  You lose consciousness. Summary  After the procedure, it is common to have a sore throat and a cough.  Return to your normal activities as told by your  health care provider. Ask your health care provider what activities are safe for you.  Take over-the-counter and prescription medicines only as told by your health care provider.  Report any unusual symptoms to your health care provider. This information is not intended to replace advice given to you by your health care provider. Make sure you discuss any questions you have with your health care provider. Document Revised: 11/01/2018 Document Reviewed: 10/26/2016 Elsevier Patient Education  [place]. Moderate Conscious Sedation,  Adult Sedation is the use of medicines to promote relaxation and relieve discomfort and anxiety. Moderate conscious sedation is a type of sedation. Under moderate conscious sedation, you are less alert than normal, but you are still able to respond to instructions, touch, or both. Moderate conscious sedation is used during short medical and dental procedures. It is milder than deep sedation, which is a type of sedation under which you cannot be easily woken up. It is also milder than general anesthesia, which is the use of medicines to make you unconscious. Moderate conscious sedation allows you to return to your regular activities sooner. Tell a health care provider about:  Any allergies you have.  All medicines you are taking, including vitamins, herbs, eye drops, creams, and over-the-counter medicines.  Use of steroids (by mouth or creams).  Any problems you or family members have had with sedatives and anesthetic medicines.  Any blood disorders you have.  Any surgeries you have had.  Any medical conditions you have, such as sleep apnea.  Whether you are pregnant or may be pregnant.  Any use of cigarettes, alcohol, marijuana, or street drugs. What are the risks? Generally, this is a safe procedure. However, problems may occur, including:  Getting too much medicine (oversedation).  Nausea.  Allergic reaction to medicines.  Trouble breathing. If this happens, a breathing tube may be used to help with breathing. It will be removed when you are awake and breathing on your own.  Heart trouble.  Lung trouble. What happens before the procedure? Staying hydrated Follow instructions from your health care provider about hydration, which may include:  Up to 2 hours before the procedure - you may continue to drink clear liquids, such as water, clear fruit juice, black coffee, and plain tea. Eating and drinking restrictions Follow instructions from your health care provider about  eating and drinking, which may include:  8 hours before the procedure - stop eating heavy meals or foods such as meat, fried foods, or fatty foods.  6 hours before the procedure - stop eating light meals or foods, such as toast or cereal.  6 hours before the procedure - stop drinking milk or drinks that contain milk.  2 hours before the procedure - stop drinking clear liquids. Medicine Ask your health care provider about:  Changing or stopping your regular medicines. This is especially important if you are taking diabetes medicines or blood thinners.  Taking medicines such as aspirin and ibuprofen. These medicines can thin your blood. Do not take these medicines before your procedure if your health care provider instructs you not to.  Tests and exams  You will have a physical exam.  You may have blood tests done to show: ? How well your kidneys and liver are working. ? How well your blood can clot. General instructions  Plan to have someone take you home from the hospital or clinic.  If you will be going home right after the procedure, plan to have someone with you for 24 hours.  What happens during the procedure?  An IV tube will be inserted into one of your veins.  Medicine to help you relax (sedative) will be given through the IV tube.  The medical or dental procedure will be performed. What happens after the procedure?  Your blood pressure, heart rate, breathing rate, and blood oxygen level will be monitored often until the medicines you were given have worn off.  Do not drive for 24 hours. This information is not intended to replace advice given to you by your health care provider. Make sure you discuss any questions you have with your health care provider. Document Revised: 09/09/2017 Document Reviewed: 01/17/2016 Elsevier Patient Education  2020 [employer].

## 2020-07-02 NOTE — Procedures (Signed)
Pre procedural Dx: Right lower lobe pulmonary nodule Post procedural Dx: Same  Technically successful CT guided biopsy of hypermetabolic right lower lobe pulmonary nodule.    EBL: None.  Complications: None immediate.   Ronny Bacon, MD Pager #: [phone]

## 2020-07-02 NOTE — H&P (Signed)
Referring Physician(s): Burton,Lacie K/Feng,Y  Supervising Physician: [name]  Patient Status:  [facility] OP  Chief Complaint: "I'm having a lung biopsy"   Subjective: Patient familiar to IR service from right lower quadrant abdominal fluid collection drain placement on 06/13/2018, right thoracentesis in 2019, right upper quadrant abdominal fluid collection drain placement on 06/17/2018 followed by abdominal drain removals on 07/18/2018.  He has a prior history of colon cancer in 2019, status post right colectomy along with chemotherapy. Recent PET scan has revealed:   1. The index nodule within the superior segment of the right lower lobe is FDG avid and worrisome for either primary bronchogenic neoplasm versus metastatic disease 2. Within the anteromedial aspect of the left maxillary sinus, in the region of the nasolacrimal duct, there is a focal soft tissue attenuating filling defect which exhibits intense FDG uptake within SUV max of 55.8. The although focal uptake within the paranasal sinuses may be seen with sinusitis primary neoplasm of the sinuses or nasal lacrimal duct cannot be excluded. In light of the intense FDG uptake in this area further investigation with contrast enhanced CT or MRI of the soft tissues of neck to include the maxillary sinuses is recommended. 3.  Aortic Atherosclerosis (ICD10-I70.0). 4. Coronary artery calcifications 5. Prior granulomatous disease  He presents today for CT-guided right lower lobe lung nodule biopsy for further evaluation.  He currently denies fever, headache, chest pain, dyspnea, abdominal/back pain, nausea, vomiting or bleeding.  He is hard of hearing and has an occasional cough.  Additional medical history as listed below.   Past Medical History:  Diagnosis Date  . Alcohol abuse   . Anemia   . Arthritis    hands  . Blood transfusion without reported diagnosis   . Cancer [facility])    colon cancer  . Chronic low back pain 10/14/2011   . Colon polyps    hyperplastic (2004, 2010) and adenomatous (1990).    . COLONIC POLYPS, HX OF 03/05/2008  . Esophageal stricture    hx of  . Gastric ulcer   . GERD 03/05/2008  . GERD (gastroesophageal reflux disease) 1994   associated peptic strictures  . HYPERLIPIDEMIA 03/05/2008  . HYPERTENSION 03/05/2008  . Hypertension   . IBS 03/05/2008  . Impaired glucose tolerance 10/12/2011  . Iron deficiency anemia   . LEG CRAMPS 09/02/2010  . Myocardial infarction [facility])    May 2018  . PAD (peripheral artery disease) ([place]) 10/14/2011  . PAD (peripheral artery disease) ([place]) 2013  . PERIPHERAL EDEMA 09/02/2010  . PERIPHERAL NEUROPATHY 03/05/2008  . Prostatitis    hx of  . VARICOSE VEINS, LOWER EXTREMITIES 06/09/2009   Past Surgical History:  Procedure Laterality Date  . CATARACT EXTRACTION  bilat  . COLECTOMY    . COLONOSCOPY    . CORONARY ARTERY BYPASS GRAFT N/A 02/15/2017   Procedure: CORONARY ARTERY BYPASS GRAFTING times three  with left internal mammary harvest and endoscopic harvest of Right SVG. Grafts of LIMA to  LAD, SVG to Distal Circ, and to First Diag.;  Surgeon: Grace Isaac, MD;  Location: [place];  Service: Open Heart Surgery;  Laterality: N/A;  . ESOPHAGOGASTRODUODENOSCOPY N/A 11/14/2014   Procedure: ESOPHAGOGASTRODUODENOSCOPY (EGD);  Surgeon: Jerene Bears, MD;  Location: [facility] ENDOSCOPY;  Service: Endoscopy;  Laterality: N/A;  . IR RADIOLOGIST EVAL & MGMT  07/04/2018  . IR RADIOLOGIST EVAL & MGMT  07/18/2018  . LAPAROTOMY N/A 05/19/2018   Procedure: EXPLORATORY LAPAROTOMY RIGHT COLECTOMY;  Surgeon: Fanny Skates,  MD;  Location: [facility] ORS;  Service: General;  Laterality: N/A;  . LEFT HEART CATH AND CORONARY ANGIOGRAPHY N/A 02/14/2017   Procedure: Left Heart Cath and Coronary Angiography;  Surgeon: Belva Crome, MD;  Location: [place] CV LAB;  Service: Cardiovascular;  Laterality: N/A;  . LUMBAR SPINE SURGERY  11/2008   Dr Joya Salm  . TEE WITHOUT CARDIOVERSION N/A 02/15/2017    Procedure: TRANSESOPHAGEAL ECHOCARDIOGRAM (TEE);  Surgeon: Grace Isaac, MD;  Location: [place];  Service: Open Heart Surgery;  Laterality: N/A;  . UPPER GASTROINTESTINAL ENDOSCOPY        Allergies: Patient has no known allergies.  Medications: Prior to Admission medications   Medication Sig Start Date End Date Taking? Authorizing Provider  acetaminophen (TYLENOL) 500 MG tablet Take 1 tablet (500 mg total) by mouth every 6 (six) hours as needed for mild pain. 05/30/18  Yes Norm Parcel, PA-C  Cholecalciferol (VITAMIN D-3 PO) Take 2 capsules by mouth daily.    Yes [provider]  ferrous sulfate 325 (65 FE) MG EC tablet Take 325 mg by mouth daily.   Yes [provider]  hydrochlorothiazide (HYDRODIURIL) 25 MG tablet Take 1 tablet (25 mg total) by mouth daily. 03/19/20  Yes Dorothy Spark, MD  isosorbide mononitrate (IMDUR) 30 MG 24 hr tablet Take 0.5 tablets (15 mg total) by mouth daily. 06/19/20  Yes Dorothy Spark, MD  metoprolol succinate (TOPROL-XL) 50 MG 24 hr tablet Take 1 tablet (50 mg total) by mouth daily. Take with or immediately following a meal. 11/02/19  Yes Dorothy Spark, MD  nitroGLYCERIN (NITROSTAT) 0.4 MG SL tablet Place 1 tablet (0.4 mg total) under the tongue every 5 (five) minutes as needed for chest pain (or tightness). 03/19/20  Yes Dorothy Spark, MD  pravastatin (PRAVACHOL) 40 MG tablet TAKE 1 TABLET BY MOUTH AT BEDTIME 11/02/19  Yes Dorothy Spark, MD  bacitracin 500 UNIT/GM ointment Apply 1 application topically daily as needed for wound care.    [provider]  Multiple Vitamins-Minerals (PRESERVISION AREDS 2 PO) Take 1 tablet by mouth daily.    [provider]  Naphazoline HCl (CLEAR EYES OP) Place 1 drop into both eyes daily as needed (burning eyes).    [provider]     Vital Signs: BP (!) 162/98   Pulse 83   Temp 98.4 F (36.9 C) (Oral)   Resp 20   Ht 5\' 11"  (1.803 m)   Wt 224 lb (101.6  kg)   SpO2 98%   BMI 31.24 kg/m   Physical Exam awake, alert.  Chest clear to auscultation bilaterally.  Heart with regular rate and rhythm.  Abdomen soft, positive bowel sounds, nontender.  Trace pretibial edema noted  Imaging: No results found.  Labs:  CBC: Recent Labs    08/20/19 1010 12/05/19 1005 05/28/20 0855 07/02/20 0950  WBC 4.8 5.0 3.6* 5.4  HGB 14.4 15.4 15.1 16.2  HCT 43.1 46.7 45.0 48.9  PLT 180 159 162 191    COAGS: No results for input(s): INR, APTT in the last 8760 hours.  BMP: Recent Labs    08/20/19 1010 12/05/19 1005 05/28/20 0855  NA 138 139 136  K 4.3 3.9 3.6  CL 105 104 98  CO2 24 26 29   GLUCOSE 117* 126* 145*  BUN 13 14 13   CALCIUM 8.8* 9.0 10.0  CREATININE 0.99 1.09 1.14  GFRNONAA >60 >60 58*  GFRAA >60 >60 >60    LIVER FUNCTION  TESTS: Recent Labs    08/20/19 1010 12/05/19 1005 05/28/20 0855  BILITOT 0.6 0.6 0.9  AST 16 15 16   ALT 14 13 11   ALKPHOS 86 88 93  PROT 6.7 6.7 6.5  ALBUMIN 3.9 3.9 3.8    Assessment and Plan: 86 yo male, ex smoker, with prior history of colon cancer in 2019, status post right colectomy along with chemotherapy. Recent PET scan has revealed:   1. The index nodule within the superior segment of the right lower lobe is FDG avid and worrisome for either primary bronchogenic neoplasm versus metastatic disease 2. Within the anteromedial aspect of the left maxillary sinus, in the region of the nasolacrimal duct, there is a focal soft tissue attenuating filling defect which exhibits intense FDG uptake within SUV max of 55.8. The although focal uptake within the paranasal sinuses may be seen with sinusitis primary neoplasm of the sinuses or nasal lacrimal duct cannot be excluded. In light of the intense FDG uptake in this area further investigation with contrast enhanced CT or MRI of the soft tissues of neck to include the maxillary sinuses is recommended. 3.  Aortic Atherosclerosis (ICD10-I70.0). 4.  Coronary artery calcifications 5. Prior granulomatous disease  He presents today for CT-guided right lower lobe lung nodule biopsy for further evaluation. Risks and benefits of procedure was discussed with the patient  including, but not limited to bleeding, infection, damage to adjacent structures/pneumothorax requiring chest tube placement or low yield requiring additional tests and death.   All of the questions were answered and there is agreement to proceed. COVID- 19 neg on 06/23/20  Consent signed and in chart.     Electronically Signed: D. Rowe Robert, PA-C 07/02/2020, 10:37 AM   I spent a total of 30 minutes at the the patient's bedside AND on the patient's hospital floor or unit, greater than 50% of which was counseling/coordinating care for CT-guided right lower lobe lung nodule biopsy

## 2020-07-03 ENCOUNTER — Telehealth: Payer: Self-pay | Admitting: Hematology

## 2020-07-03 NOTE — Telephone Encounter (Signed)
Scheduled appt per 9/22 sch msg - pt is aware of appt

## 2020-07-04 LAB — SURGICAL PATHOLOGY

## 2020-07-04 NOTE — Progress Notes (Signed)
[facility]   [phone] Fax:([phone]) [phone]   Clinic Follow up Note   Patient Care Team: Patient, No Pcp Per as PCP - General (General Practice) Pyrtle, Lajuan Lines, MD as Consulting Physician (Gastroenterology) Allyn Kenner, MD as Consulting Physician (Dermatology) Dorothy Spark, MD as Consulting Physician (Cardiology) Levin Erp, [place] as Physician Assistant (Gastroenterology) Murrell Converse as Physician Assistant (Cardiology)  Date of Service:  07/07/2020  CHIEF COMPLAINT: F/u on stage III colon cancer  SUMMARY OF ONCOLOGIC HISTORY: Oncology History Overview Note  Cancer Staging Cancer of ascending colon pT4apN1 s/p right colectomy 05/19/2018 Staging form: Colon and Rectum, AJCC 8th Edition - Pathologic stage from 05/19/2018: Stage IIIB (pT4a, pN1, cM0) - Signed by Truitt Merle, MD on 05/24/2018     Cancer of ascending colon pT4apN1 s/p right colectomy 05/19/2018  05/18/2018 Imaging   05/18/2018 CT AP IMPRESSION: 1. There appears to be a nearly obstructing lesion in the region of the hepatic flexure of the colon which is highly concerning for primary colonic neoplasm. Slight haziness of the surrounding soft tissues may be indicative of early local invasion. This appears partially obstructive as evidenced by dilatation of more proximal aspects of the small bowel and colon. 2. Left adrenal lesion is stable compared to prior studies, previously characterized as an adenoma. 3. Aortic atherosclerosis, in addition to left main and 3 vessel coronary artery disease. Status post median sternotomy for CABG. 4. Additional incidental findings, as above.  Aortic Atherosclerosis (ICD10-I70.0).   05/18/2018 - 05/30/2018 Hospital Admission   Admit date: 05/18/2018 Admission diagnosis: Colon Cancer Discharged on: 05/30/2018   05/19/2018 Cancer Staging   Staging form: Colon and Rectum, AJCC 8th Edition - Pathologic stage from 05/19/2018: Stage IIIB (pT4a, pN1,  cM0) - Signed by Truitt Merle, MD on 05/24/2018    05/19/2018 Pathology Results   05/19/2018 Surgical Pathology Diagnosis Colon, segmental resection for tumor, right - INVASIVE COLORECTAL ADENOCARCINOMA, 5 CM. - CARCINOMA FOCALLY LESS THAN 0.1 CM FROM SEROSAL SURFACE. - MARGINS NOT INVOLVED. - METASTATIC CARCINOMA IN ONE OF EIGHT LYMPH NODES (1/9). - BENIGN APPENDIX.   05/19/2018 Surgery   EXPLORATORY LAPAROTOMY with right colectomy with Fanny Skates, MD   05/24/2018 Initial Diagnosis   Cancer of right colon ([name])   06/12/2018 Imaging    06/12/2018 CT CAP IMPRESSION: 1. Evidence all bowel perforation/anastomosed breakdown with large volume intraperitoneal free air fluid in the RIGHT upper quadrant adjacent and above the liver. 2. Large loculated fluid collection in the RIGHT anterior pelvis extending along the LEFT pericolic gutter consists with PERITONEAL ABSCESS. 3. Of note, no large volume intraperitoneal free fluid. Oral contrast passed through the RIGHT colon anastomosis without evidence of leak. Findings could indicate a prior anastomotic breakdown which has healed in the interval with subsequent abscess formation from the prior leak / breakdown.   06/17/2018 Procedure   06/17/2018 US Thoracentesis IMPRESSION: Successful ultrasound guided right thoracentesis yielding 1 L of pleural fluid. No pneumothorax on post-procedure chest x-ray.  No malignancy on pathology   06/20/2018 Imaging   06/20/2018 CT AP IMPRESSION: 1. Decompression of right upper quadrant air and fluid collection after percutaneous catheter drainage. There remains a tiny rim of fluid as well as small extraluminal gas bubbles in this region suspicious for continued leak from bowel. 2. Further decrease in size of residual abscess fluid collection in the pelvis after percutaneous catheter drainage. There remains some fluid around the drainage catheter in the right pelvis.   07/04/2018 Imaging  07/04/2018 CT  AP IMPRESSION: 1. Further decompression of right perihepatic abscess with resolution of extraluminal air. 2. Relatively stable volume of fluid surrounding the right pelvic drainage catheter.   07/05/2018 Imaging   07/05/2018 CT AP IMPRESSION: 1. No change compared to the CT scan performed 9 hours earlier. 2. Anterior right perihepatic space collection is decompressed by indwelling percutaneous drain, with no residual measurable collection in this location. 3. Thick walled right lower quadrant collection with indwelling percutaneous drain is stable. No new focal fluid collections. 4. Stable postsurgical changes from right hemicolectomy with no evidence of bowel obstruction. Stable reactive wall thickening in the sigmoid colon adjacent to the right lower quadrant collection. 5. Stable small to moderate dependent right pleural effusion. 6. Stable left adrenal adenoma. 7.  Aortic Atherosclerosis (ICD10-I70.0).   07/31/2018 - 01/21/2019 Chemotherapy   Adjuvant Xeloda 2000mg  bid, 7 days on, 7 days off.  Reduced to 2000mg  in the AM and 1500mg  in the PM due to diarrhea. Completed on 01/21/19    04/12/2019 Imaging   CT CAP W contrast 04/12/19  IMPRESSION: 1. Status post right hemicolectomy. 2. No definite metastatic disease in the chest, abdomen, or pelvis. 7 mm right lower lobe pulmonary nodule, obscured by atelectasis on previous chest CT and not included on multiple prior abdomen / pelvis CTs. Close follow-up recommended to ensure stability. 3.  Aortic Atherosclerois (ICD10-170.0)   09/04/2019 Imaging   CT AP W Contrast I MPRESSION: 1. No evidence of local colon cancer recurrence at the RIGHT hemicolectomy site. 2. No evidence of metastatic disease in the abdomen pelvis. 3. Mild nodularity at the LEFT lung base is favored benign. Recommend attention on routine surveillance 4. Aortic Atherosclerosis (ICD10-I70.0).   05/30/2020 Imaging   CT CAP w contrast IMPRESSION: 1. Significant  enlargement of a right lower lobe pulmonary nodule. Suspicious for primary bronchogenic carcinoma. Isolated pulmonary metastasis felt less likely. 2. Otherwise, no evidence of metastatic disease from patient's colon cancer. 3. Aortic atherosclerosis (ICD10-I70.0) and emphysema (ICD10-J43.9). 4. Left adrenal adenoma.   These results will be called to the ordering clinician or representative by the Radiologist Assistant, and communication documented in the PACS or [employer].   06/11/2020 PET scan   IMPRESSION: 1. The index nodule within the superior segment of the right lower lobe is FDG avid and worrisome for either primary bronchogenic neoplasm versus metastatic disease 2. Within the anteromedial aspect of the left maxillary sinus, in the region of the nasolacrimal duct, there is a focal soft tissue attenuating filling defect which exhibits intense FDG uptake within SUV max of 55.8. The although focal uptake within the paranasal sinuses may be seen with sinusitis primary neoplasm of the sinuses or nasal lacrimal duct cannot be excluded. In light of the intense FDG uptake in this area further investigation with contrast enhanced CT or MRI of the soft tissues of neck to include the maxillary sinuses is recommended. 3.  Aortic Atherosclerosis (ICD10-I70.0). 4. Coronary artery calcifications 5. Prior granulomatous disease.     07/02/2020 Relapse/Recurrence   FINAL MICROSCOPIC DIAGNOSIS:   A. LUNG, RIGHT LOWER LOBE, NEEDLE CORE BIOPSY:  - Non-small cell carcinoma, see comment    COMMENT:   Immunohistochemical stains for characterization of the tumor phenotype  are pending and will be reported in an addendum.  Dr. Tresa Moore reviewed the  case and concurs with the diagnosis. Dr. [name] was notified on 07/03/2020.    Non-small cell lung cancer, right ([place])  07/02/2020 Cancer Staging   Staging form: Lung,  AJCC 8th Edition - Clinical stage from 07/02/2020: Stage IA2 (cT1b, cN0, cM0) -  Signed by Truitt Merle, MD on 07/06/2020   07/06/2020 Initial Diagnosis   Non-small cell lung cancer, right ([place])      CURRENT THERAPY:  Surveillance  INTERVAL HISTORY:  TEMITOPE GRIFFING is here for a follow up and to discuss recent biopsy results. He presents to the clinic alone.  He is clinically doing well overall, denies any chest discomfort, cough, or worsening of mild dyspnea on exertion.  He reports neck cramps for the past for 5 days, no other new symptoms.   All other systems were reviewed with the patient and are negative.  MEDICAL HISTORY:  Past Medical History:  Diagnosis Date  . Alcohol abuse   . Anemia   . Arthritis    hands  . Blood transfusion without reported diagnosis   . Cancer [facility])    colon cancer  . Chronic low back pain 10/14/2011  . Colon polyps    hyperplastic (2004, 2010) and adenomatous (1990).    . COLONIC POLYPS, HX OF 03/05/2008  . Esophageal stricture    hx of  . Gastric ulcer   . GERD 03/05/2008  . GERD (gastroesophageal reflux disease) 1994   associated peptic strictures  . HYPERLIPIDEMIA 03/05/2008  . HYPERTENSION 03/05/2008  . Hypertension   . IBS 03/05/2008  . Impaired glucose tolerance 10/12/2011  . Iron deficiency anemia   . LEG CRAMPS 09/02/2010  . Myocardial infarction [facility])    May 2018  . PAD (peripheral artery disease) ([place]) 10/14/2011  . PAD (peripheral artery disease) ([place]) 2013  . PERIPHERAL EDEMA 09/02/2010  . PERIPHERAL NEUROPATHY 03/05/2008  . Prostatitis    hx of  . VARICOSE VEINS, LOWER EXTREMITIES 06/09/2009    SURGICAL HISTORY: Past Surgical History:  Procedure Laterality Date  . CATARACT EXTRACTION  bilat  . COLECTOMY    . COLONOSCOPY    . CORONARY ARTERY BYPASS GRAFT N/A 02/15/2017   Procedure: CORONARY ARTERY BYPASS GRAFTING times three  with left internal mammary harvest and endoscopic harvest of Right SVG. Grafts of LIMA to  LAD, SVG to Distal Circ, and to First Diag.;  Surgeon: Grace Isaac, MD;  Location: [place];   Service: Open Heart Surgery;  Laterality: N/A;  . ESOPHAGOGASTRODUODENOSCOPY N/A 11/14/2014   Procedure: ESOPHAGOGASTRODUODENOSCOPY (EGD);  Surgeon: Jerene Bears, MD;  Location: [facility] ENDOSCOPY;  Service: Endoscopy;  Laterality: N/A;  . IR RADIOLOGIST EVAL & MGMT  07/04/2018  . IR RADIOLOGIST EVAL & MGMT  07/18/2018  . LAPAROTOMY N/A 05/19/2018   Procedure: EXPLORATORY LAPAROTOMY RIGHT COLECTOMY;  Surgeon: Fanny Skates, MD;  Location: [facility] ORS;  Service: General;  Laterality: N/A;  . LEFT HEART CATH AND CORONARY ANGIOGRAPHY N/A 02/14/2017   Procedure: Left Heart Cath and Coronary Angiography;  Surgeon: Belva Crome, MD;  Location: [place] CV LAB;  Service: Cardiovascular;  Laterality: N/A;  . LUMBAR SPINE SURGERY  11/2008   Dr Joya Salm  . TEE WITHOUT CARDIOVERSION N/A 02/15/2017   Procedure: TRANSESOPHAGEAL ECHOCARDIOGRAM (TEE);  Surgeon: Grace Isaac, MD;  Location: [place];  Service: Open Heart Surgery;  Laterality: N/A;  . UPPER GASTROINTESTINAL ENDOSCOPY      I have reviewed the social history and family history with the patient and they are unchanged from previous note.  ALLERGIES:  has No Known Allergies.  MEDICATIONS:  Current Outpatient Medications  Medication Sig Dispense Refill  . acetaminophen (TYLENOL) 500 MG tablet Take 1 tablet (500  mg total) by mouth every 6 (six) hours as needed for mild pain.  0  . bacitracin 500 UNIT/GM ointment Apply 1 application topically daily as needed for wound care.    . Cholecalciferol (VITAMIN D-3 PO) Take 2 capsules by mouth daily.     . ferrous sulfate 325 (65 FE) MG EC tablet Take 325 mg by mouth daily.    . hydrochlorothiazide (HYDRODIURIL) 25 MG tablet Take 1 tablet (25 mg total) by mouth daily. 90 tablet 2  . isosorbide mononitrate (IMDUR) 30 MG 24 hr tablet Take 0.5 tablets (15 mg total) by mouth daily. 45 tablet 2  . metoprolol succinate (TOPROL-XL) 50 MG 24 hr tablet Take 1 tablet (50 mg total) by mouth daily. Take with or immediately  following a meal. 90 tablet 3  . Multiple Vitamins-Minerals (PRESERVISION AREDS 2 PO) Take 1 tablet by mouth daily.    . Naphazoline HCl (CLEAR EYES OP) Place 1 drop into both eyes daily as needed (burning eyes).    . nitroGLYCERIN (NITROSTAT) 0.4 MG SL tablet Place 1 tablet (0.4 mg total) under the tongue every 5 (five) minutes as needed for chest pain (or tightness). 25 tablet 3  . pravastatin (PRAVACHOL) 40 MG tablet TAKE 1 TABLET BY MOUTH AT BEDTIME 90 tablet 3   No current facility-administered medications for this visit.    PHYSICAL EXAMINATION: ECOG PERFORMANCE STATUS: 1 - Symptomatic but completely ambulatory  Vitals:   07/07/20 1002  BP: 136/82  Pulse: 95  Resp: 18  Temp: 97.8 F (36.6 C)  SpO2: 97%   Filed Weights   07/07/20 1002  Weight: 230 lb 12.8 oz (104.7 kg)    GENERAL:alert, no distress and comfortable SKIN: skin color, texture, turgor are normal, no rashes or significant lesions EYES: normal, Conjunctiva are pink and non-injected, sclera clear NECK: supple, thyroid normal size, non-tender, without nodularity LYMPH:  no palpable lymphadenopathy in the cervical, axillary  LUNGS: clear to auscultation and percussion with normal breathing effort HEART: regular rate & rhythm and no murmurs and no lower extremity edema ABDOMEN:abdomen soft, non-tender and normal bowel sounds Musculoskeletal:no cyanosis of digits and no clubbing  NEURO: alert & oriented x 3 with fluent speech, no focal motor/sensory deficits  LABORATORY DATA:  I have reviewed the data as listed CBC Latest Ref Rng & Units 07/02/2020 05/28/2020 12/05/2019  WBC 4.0 - 10.5 K/uL 5.4 3.6(L) 5.0  Hemoglobin 13.0 - 17.0 g/dL 16.2 15.1 15.4  Hematocrit 39 - 52 % 48.9 45.0 46.7  Platelets 150 - 400 K/uL 191 162 159     CMP Latest Ref Rng & Units 05/28/2020 12/05/2019 08/20/2019  Glucose 70 - 99 mg/dL 145(H) 126(H) 117(H)  BUN 8 - 23 mg/dL 13 14 13   Creatinine 0.61 - 1.24 mg/dL 1.14 1.09 0.99  Sodium 135  - 145 mmol/L 136 139 138  Potassium 3.5 - 5.1 mmol/L 3.6 3.9 4.3  Chloride 98 - 111 mmol/L 98 104 105  CO2 22 - 32 mmol/L 29 26 24   Calcium 8.9 - 10.3 mg/dL 10.0 9.0 8.8(L)  Total Protein 6.5 - 8.1 g/dL 6.5 6.7 6.7  Total Bilirubin 0.3 - 1.2 mg/dL 0.9 0.6 0.6  Alkaline Phos 38 - 126 U/L 93 88 86  AST 15 - 41 U/L 16 15 16   ALT 0 - 44 U/L 11 13 14       RADIOGRAPHIC STUDIES: I have personally reviewed the radiological images as listed and agreed with the findings in the report. No results found.  ASSESSMENT & PLAN:  JAMEEK BRUNTZ is a 86 y.o. male with   1.  Non-small cell lung cancer in right lower lobe, squamous cell carcinoma, cT1bN0M0 stage IA -This was discovered on his surveillance CT scan for colon cancer, the lesion was hypermetabolic on PET, no other distant metastasis. -I discussed his recent fine-needle biopsy of the lung mass, which showed squamous cell carcinoma.  He had a history of heavy smoking. -Given the very early stage lung cancer, he is a candidate for lobectomy or SBRT.  Due to his advanced age and significant comorbidities, especially heart disease, I think he is a poor candidate for surgery.  Patient also prefers nonsurgical options.  I will refer him to radiation oncology to discuss SBRT. -No adjuvant chemotherapy is recommended for his early stage disease -We will monitor cancer recurrence after definitive treatment with CT chest every 3-12 months for next 5 years   2. Adenocarcinoma of right colon, invasive adenocarcinoma, stage IIIB (pT4a, pN1, cM0), MSS -Diagnosed in 05/2018. Treated with hemicolectomy. HereceivedadjuvantXeloda 2000 mg BID one week on, one week offfrom10/21/2019 to 01/29/2019.  -he is clinically doing well, no evidence of recurrence so far -We will continue monitoring. -plan to repeat CT scan in 5 months   3.  Hypermetabolic left maxillary sinus lesion -found on PET, he is asymptomatic -plan to get a CT sinus w contrast after his  lung cancer treatment   4. CAD S/P CABG -f/u withDr Opalski and Dr Meda Coffee. -stable -He has been having occasional chest pain since surgery and uses nitroglycerin as needed. Overall occurred 3 times.  -BP has been mildly elevated and stable in clinic.  5. 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   Orders Placed This Encounter  Procedures  . CT MAXILLOFACIAL W CONTRAST    Abnormal findings on recent PET    Standing Status:   Future    Standing Expiration Date:   07/07/2021    Order Specific Question:   If indicated for the ordered procedure, I authorize the administration of contrast media per Radiology protocol    Answer:   Yes    Order Specific Question:   Preferred imaging location?    Answer:   [facility]    Order Specific Question:   Release to patient    Answer:   Immediate    Order Specific Question:   Radiology Contrast Protocol - do NOT remove file path    Answer:   [email or web address]  . Ambulatory referral to Radiation Oncology    Referral Priority:   Urgent    Referral Type:   Consultation    Referral Reason:   Specialty Services Required    Requested Specialty:   Radiation Oncology    Number of Visits Requested:   1   All questions were answered. The patient knows to call the clinic with any problems, questions or concerns. No barriers to learning was detected. The total time spent in the appointment was 30 minutes.     Truitt Merle, MD 07/07/2020   I, Joslyn Devon, am acting as scribe for Truitt Merle, MD.   I have reviewed the above documentation for accuracy and completeness, and I  agree with the  above.

## 2020-07-06 DIAGNOSIS — C3431 Malignant neoplasm of lower lobe, right bronchus or lung: Secondary | ICD-10-CM | POA: Insufficient documentation

## 2020-07-07 ENCOUNTER — Encounter: Payer: Self-pay | Admitting: Hematology

## 2020-07-07 ENCOUNTER — Other Ambulatory Visit: Payer: Self-pay

## 2020-07-07 ENCOUNTER — Inpatient Hospital Stay (HOSPITAL_BASED_OUTPATIENT_CLINIC_OR_DEPARTMENT_OTHER): Payer: Medicare HMO | Admitting: Hematology

## 2020-07-07 VITALS — BP 136/82 | HR 95 | Temp 97.8°F | Resp 18 | Ht 71.0 in | Wt 230.8 lb

## 2020-07-07 DIAGNOSIS — I2510 Atherosclerotic heart disease of native coronary artery without angina pectoris: Secondary | ICD-10-CM | POA: Diagnosis not present

## 2020-07-07 DIAGNOSIS — C182 Malignant neoplasm of ascending colon: Secondary | ICD-10-CM

## 2020-07-07 DIAGNOSIS — Z85038 Personal history of other malignant neoplasm of large intestine: Secondary | ICD-10-CM | POA: Diagnosis not present

## 2020-07-07 DIAGNOSIS — R930 Abnormal findings on diagnostic imaging of skull and head, not elsewhere classified: Secondary | ICD-10-CM

## 2020-07-07 DIAGNOSIS — C3491 Malignant neoplasm of unspecified part of right bronchus or lung: Secondary | ICD-10-CM | POA: Diagnosis not present

## 2020-07-07 DIAGNOSIS — D649 Anemia, unspecified: Secondary | ICD-10-CM | POA: Diagnosis not present

## 2020-07-07 DIAGNOSIS — G620 Drug-induced polyneuropathy: Secondary | ICD-10-CM | POA: Diagnosis not present

## 2020-07-09 ENCOUNTER — Telehealth: Payer: Self-pay | Admitting: Hematology

## 2020-07-09 NOTE — Telephone Encounter (Signed)
Scheduled per 9/27 los. Pt is aware of appt times and dates. 

## 2020-07-09 NOTE — Progress Notes (Signed)
Thoracic Location of Tumor / Histology: Non small cell lung cancer, Right lower lobe lung, Squamous cell carcinoma  Patient presented for surveillance CT scan for colon cancer.  PET 06/11/2020:  The index nodule within the superior segment of the right lower lobe is FDG avid and worrisome for either primary bronchogenic neoplasm versus metastatic disease  CT CAP 05/30/2020: Significant enlargement of a right lower lobe pulmonary nodule.  Suspicious for primary bronchogenic carcinoma. Isolated pulmonary metastasis felt less likely.  Otherwise, no evidence of metastatic disease from patient's colon cancer.  Biopsies of RLL Lung 07/02/2020    Tobacco/Marijuana/Snuff/ETOH use: Former smoker  Past/Anticipated interventions by cardiothoracic surgery, if any:   Past/Anticipated interventions by medical oncology, if any:  Dr. [name] 07/07/2020 -Given the very early stage lung cancer, he is a candidate for lobectomy or SBRT.  -Due to his advanced age and significant comorbidities, especially heart disease, I think he is a poor candidate for surgery.  Patient also prefers nonsurgical options.  I will refer him to radiation oncology to discuss SBRT. -No adjuvant chemotherapy is recommended for his early stage disease -We will monitor cancer recurrence after definitive treatment with CT chest every 3-12 months for next 5 years.   Signs/Symptoms  Weight changes, if any:   Respiratory complaints, if any: No  Hemoptysis, if any: productive cough with clear phlegm, this is his baseline.  Pain issues, if any:  Had some chest tightness a few weeks ago and he took his nitroglycerin.  SAFETY ISSUES:  Prior radiation? No  Pacemaker/ICD?  No  Possible current pregnancy? n/a   Is the patient on methotrexate?   Current Complaints / other details:

## 2020-07-10 ENCOUNTER — Ambulatory Visit
Admission: RE | Admit: 2020-07-10 | Discharge: 2020-07-10 | Disposition: A | Discharge status: Home / Self Care | Payer: Medicare HMO | Source: Ambulatory Visit | Attending: Radiation Oncology | Admitting: Radiation Oncology

## 2020-07-10 ENCOUNTER — Other Ambulatory Visit: Payer: Self-pay

## 2020-07-10 ENCOUNTER — Other Ambulatory Visit: Payer: Self-pay | Admitting: *Deleted

## 2020-07-10 ENCOUNTER — Encounter: Payer: Self-pay | Admitting: Radiation Oncology

## 2020-07-10 VITALS — BP 153/92 | HR 90 | Temp 98.2°F | Resp 20 | Ht 71.0 in | Wt 232.8 lb

## 2020-07-10 DIAGNOSIS — Z85038 Personal history of other malignant neoplasm of large intestine: Secondary | ICD-10-CM | POA: Diagnosis not present

## 2020-07-10 DIAGNOSIS — C3431 Malignant neoplasm of lower lobe, right bronchus or lung: Secondary | ICD-10-CM | POA: Diagnosis not present

## 2020-07-10 DIAGNOSIS — I700 Atherosclerosis of aorta: Secondary | ICD-10-CM | POA: Diagnosis not present

## 2020-07-10 DIAGNOSIS — E785 Hyperlipidemia, unspecified: Secondary | ICD-10-CM | POA: Diagnosis not present

## 2020-07-10 DIAGNOSIS — C182 Malignant neoplasm of ascending colon: Secondary | ICD-10-CM

## 2020-07-10 DIAGNOSIS — I739 Peripheral vascular disease, unspecified: Secondary | ICD-10-CM | POA: Diagnosis not present

## 2020-07-10 DIAGNOSIS — Z87891 Personal history of nicotine dependence: Secondary | ICD-10-CM | POA: Diagnosis not present

## 2020-07-10 DIAGNOSIS — K219 Gastro-esophageal reflux disease without esophagitis: Secondary | ICD-10-CM | POA: Diagnosis not present

## 2020-07-10 DIAGNOSIS — C3491 Malignant neoplasm of unspecified part of right bronchus or lung: Secondary | ICD-10-CM

## 2020-07-10 DIAGNOSIS — M545 Low back pain: Secondary | ICD-10-CM | POA: Insufficient documentation

## 2020-07-10 DIAGNOSIS — I10 Essential (primary) hypertension: Secondary | ICD-10-CM | POA: Diagnosis not present

## 2020-07-10 DIAGNOSIS — I252 Old myocardial infarction: Secondary | ICD-10-CM | POA: Insufficient documentation

## 2020-07-10 DIAGNOSIS — K589 Irritable bowel syndrome without diarrhea: Secondary | ICD-10-CM | POA: Diagnosis not present

## 2020-07-10 DIAGNOSIS — D509 Iron deficiency anemia, unspecified: Secondary | ICD-10-CM | POA: Insufficient documentation

## 2020-07-10 DIAGNOSIS — Z79899 Other long term (current) drug therapy: Secondary | ICD-10-CM | POA: Insufficient documentation

## 2020-07-10 DIAGNOSIS — Z86010 Personal history of colonic polyps: Secondary | ICD-10-CM | POA: Insufficient documentation

## 2020-07-10 DIAGNOSIS — F1010 Alcohol abuse, uncomplicated: Secondary | ICD-10-CM | POA: Diagnosis not present

## 2020-07-10 NOTE — Progress Notes (Signed)
The proposed treatment discussed in cancer conference 07/10/20 is for discussion purpose only and is not a binding recommendation.  The patient was not physically examined nor present for their treatment options.  Therefore, final treatment plans cannot be decided.

## 2020-07-10 NOTE — Progress Notes (Signed)
Radiation Oncology         [phone] ________________________________  Name: Vincent Mcclure        MRN: [id number]  Date of Service: 07/10/2020 DOB: [birth year]  Vincent Mcclure, No Pcp Per  Truitt Merle, MD     REFERRING PHYSICIAN: Truitt Merle, MD   DIAGNOSIS: The encounter diagnosis was Non-small cell lung cancer, right ([place]).   HISTORY OF PRESENT ILLNESS: Vincent Mcclure is a 86 y.o. male seen at the request of Dr. [name] for a newly diagnosed Stage I lung cancer with a history of locally advanced colon cancer of the right colon.  The patient was originally diagnosed with right colon cancer in August 2019 and underwent right colectomy on 05/19/2018.  He had node positive disease, and was treated with adjuvant Xeloda.  He has been followed in surveillance since, and CT imaging of the chest on 05/30/2020 showed an enlargement of a right lower lobe nodule that had been followed, no other evidence of metastatic disease was identified.  A PET scan on 06/11/2020 revealed an SUV of 55.8 with a filling defect in the left maxillary sinus, and the nodule in the right lower lobe was avid with an SUV of 6.75.  Other stigmata of atherosclerotic disease, coronary calcifications and granulomatous pulmonary disease was identified.  He did undergo a CT-guided biopsy of the right lower lobe nodule on 07/02/2020 which revealed a squamous cell carcinoma consistent with a lung primary.  He is not a good surgical candidate given his age and comorbidities, and she has recommended that the patient consider stereotactic body radiotherapy for this treatment.  He is seen today to discuss this option of therapy.   PREVIOUS RADIATION THERAPY: No   PAST MEDICAL HISTORY:  Past Medical History:  Diagnosis Date  . Alcohol abuse   . Anemia   . Arthritis    hands  . Blood transfusion without reported diagnosis   . Cancer [facility])    colon cancer  . Chronic low back pain 10/14/2011  . Colon polyps    hyperplastic (2004, 2010) and  adenomatous (1990).    . COLONIC POLYPS, HX OF 03/05/2008  . Esophageal stricture    hx of  . Gastric ulcer   . GERD 03/05/2008  . GERD (gastroesophageal reflux disease) 1994   associated peptic strictures  . HYPERLIPIDEMIA 03/05/2008  . HYPERTENSION 03/05/2008  . Hypertension   . IBS 03/05/2008  . Impaired glucose tolerance 10/12/2011  . Iron deficiency anemia   . LEG CRAMPS 09/02/2010  . Myocardial infarction [facility])    May 2018  . PAD (peripheral artery disease) ([place]) 10/14/2011  . PAD (peripheral artery disease) ([place]) 2013  . PERIPHERAL EDEMA 09/02/2010  . PERIPHERAL NEUROPATHY 03/05/2008  . Prostatitis    hx of  . VARICOSE VEINS, LOWER EXTREMITIES 06/09/2009       PAST SURGICAL HISTORY: Past Surgical History:  Procedure Laterality Date  . CATARACT EXTRACTION  bilat  . COLECTOMY    . COLONOSCOPY    . CORONARY ARTERY BYPASS GRAFT N/A 02/15/2017   Procedure: CORONARY ARTERY BYPASS GRAFTING times three  with left internal mammary harvest and endoscopic harvest of Right SVG. Grafts of LIMA to  LAD, SVG to Distal Circ, and to First Diag.;  Surgeon: Grace Isaac, MD;  Location: [place];  Service: Open Heart Surgery;  Laterality: N/A;  . ESOPHAGOGASTRODUODENOSCOPY N/A 11/14/2014   Procedure: ESOPHAGOGASTRODUODENOSCOPY (EGD);  Surgeon: Jerene Bears, MD;  Location: [facility] ENDOSCOPY;  Service: Endoscopy;  Laterality:  N/A;  . IR RADIOLOGIST EVAL & MGMT  07/04/2018  . IR RADIOLOGIST EVAL & MGMT  07/18/2018  . LAPAROTOMY N/A 05/19/2018   Procedure: EXPLORATORY LAPAROTOMY RIGHT COLECTOMY;  Surgeon: Fanny Skates, MD;  Location: [facility] ORS;  Service: General;  Laterality: N/A;  . LEFT HEART CATH AND CORONARY ANGIOGRAPHY N/A 02/14/2017   Procedure: Left Heart Cath and Coronary Angiography;  Surgeon: Belva Crome, MD;  Location: [place] CV LAB;  Service: Cardiovascular;  Laterality: N/A;  . LUMBAR SPINE SURGERY  11/2008   Dr Joya Salm  . TEE WITHOUT CARDIOVERSION N/A 02/15/2017   Procedure: TRANSESOPHAGEAL  ECHOCARDIOGRAM (TEE);  Surgeon: Grace Isaac, MD;  Location: [place];  Service: Open Heart Surgery;  Laterality: N/A;  . UPPER GASTROINTESTINAL ENDOSCOPY       FAMILY HISTORY:  Family History  Problem Relation Age of Onset  . Cancer Mother        Brain Cancer  . Diabetes Father   . Heart disease Father        CAD  . Hypertension Father   . Stroke Father   . Diabetes Sister   . Stomach cancer Neg Hx   . Pancreatic cancer Neg Hx   . Colon cancer Neg Hx   . Esophageal cancer Neg Hx   . Rectal cancer Neg Hx      SOCIAL HISTORY:  reports that he quit smoking about 11 years ago. His smoking use included cigarettes. He has a 62.50 pack-year smoking history. He has quit using smokeless tobacco.  His smokeless tobacco use included chew. He reports current alcohol use of about 6.0 standard drinks of alcohol per week. He reports that he does not use drugs.  The patient is married and lives in [place].   ALLERGIES: Patient has no known allergies.   MEDICATIONS:  Current Outpatient Medications  Medication Sig Dispense Refill  . acetaminophen (TYLENOL) 500 MG tablet Take 1 tablet (500 mg total) by mouth every 6 (six) hours as needed for mild pain.  0  . bacitracin 500 UNIT/GM ointment Apply 1 application topically daily as needed for wound care.    . Cholecalciferol (VITAMIN D-3 PO) Take 2 capsules by mouth daily.     . ferrous sulfate 325 (65 FE) MG EC tablet Take 325 mg by mouth daily.    . hydrochlorothiazide (HYDRODIURIL) 25 MG tablet Take 1 tablet (25 mg total) by mouth daily. 90 tablet 2  . isosorbide mononitrate (IMDUR) 30 MG 24 hr tablet Take 0.5 tablets (15 mg total) by mouth daily. 45 tablet 2  . metoprolol succinate (TOPROL-XL) 50 MG 24 hr tablet Take 1 tablet (50 mg total) by mouth daily. Take with or immediately following a meal. 90 tablet 3  . Multiple Vitamins-Minerals (PRESERVISION AREDS 2 PO) Take 1 tablet by mouth daily.    . Naphazoline HCl (CLEAR EYES OP) Place 1  drop into both eyes daily as needed (burning eyes).    . nitroGLYCERIN (NITROSTAT) 0.4 MG SL tablet Place 1 tablet (0.4 mg total) under the tongue every 5 (five) minutes as needed for chest pain (or tightness). 25 tablet 3  . pravastatin (PRAVACHOL) 40 MG tablet TAKE 1 TABLET BY MOUTH AT BEDTIME 90 tablet 3   No current facility-administered medications for this encounter.     REVIEW OF SYSTEMS: On review of systems, the patient reports that he is doing well overall. He denies any chest pain, shortness of breath.  He has a chronic productive cough with clear mucous.  He denies fevers, chills, night sweats, unintended weight changes. He denies any bowel or bladder disturbances, and denies abdominal pain, nausea or vomiting. He denies any new musculoskeletal or joint aches or pains. A complete review of systems is obtained and is otherwise negative.     PHYSICAL EXAM:  Wt Readings from Last 3 Encounters:  07/07/20 230 lb 12.8 oz (104.7 kg)  07/02/20 224 lb (101.6 kg)  06/20/20 230 lb (104.3 kg)   Temp Readings from Last 3 Encounters:  07/07/20 97.8 F (36.6 C) (Tympanic)  07/02/20 98.4 F (36.9 C) (Oral)  06/12/20 (!) 97.4 F (36.3 C) (Tympanic)   BP Readings from Last 3 Encounters:  07/07/20 136/82  07/02/20 120/84  06/19/20 (!) 138/94   Pulse Readings from Last 3 Encounters:  07/07/20 95  07/02/20 77  06/19/20 89   In general this is a well appearing caucasian male in no acute distress. He's alert and oriented x4 and appropriate throughout the examination. Cardiopulmonary assessment is negative for acute distress and he exhibits normal effort.     ECOG = 0  0 - Asymptomatic (Fully active, able to carry on all predisease activities without restriction)  1 - Symptomatic but completely ambulatory (Restricted in physically strenuous activity but ambulatory and able to carry out work of a light or sedentary nature. For example, light housework, office work)  2 - Symptomatic,  <50% in bed during the day (Ambulatory and capable of all self care but unable to carry out any work activities. Up and about more than 50% of waking hours)  3 - Symptomatic, >50% in bed, but not bedbound (Capable of only limited self-care, confined to bed or chair 50% or more of waking hours)  4 - Bedbound (Completely disabled. Cannot carry on any self-care. Totally confined to bed or chair)  5 - Death   Eustace Pen MM, Creech RH, Tormey DC, et al. [phone]). "Toxicity and response criteria of the [facility]". [place] Oncol. 5 (6): 649-55    LABORATORY DATA:  Lab Results  Component Value Date   WBC 5.4 07/02/2020   HGB 16.2 07/02/2020   HCT 48.9 07/02/2020   MCV 90.2 07/02/2020   PLT 191 07/02/2020   Lab Results  Component Value Date   NA 136 05/28/2020   K 3.6 05/28/2020   CL 98 05/28/2020   CO2 29 05/28/2020   Lab Results  Component Value Date   ALT 11 05/28/2020   AST 16 05/28/2020   ALKPHOS 93 05/28/2020   BILITOT 0.9 05/28/2020      RADIOGRAPHY: NM PET Image Initial (PI) Skull Base To Thigh  Result Date: 06/11/2020 CLINICAL DATA:  Initial treatment strategy for lung nodule. Past medical history of colon cancer status post resection. EXAM: NUCLEAR MEDICINE PET SKULL BASE TO THIGH TECHNIQUE: 12.4 mCi F-18 FDG was injected intravenously. Full-ring PET imaging was performed from the skull base to thigh after the radiotracer. CT data was obtained and used for attenuation correction and anatomic localization. Fasting blood glucose: 116 mg/dl COMPARISON:  05/30/2020 FINDINGS: Mediastinal blood pool activity: SUV max 3.6 Liver activity: SUV max NA NECK: Within the anteromedial aspect of the left maxillary sinus, in the expected location of the left nasolacrimal duct, there is a 1.6 cm filling defect which has an SUV max of 55.8 for, image 16/4. No FDG avid lymph nodes identified within the soft tissues of the neck. Incidental CT findings: none CHEST: No FDG avid  axillary, supraclavicular, mediastinal, or hilar lymph  nodes. The pulmonary nodule within the superior segment of the right lower lobe measures 1.7 cm and has an SUV max of 6.75. Incidental CT findings: none ABDOMEN/PELVIS: No abnormal hypermetabolic activity within the liver, pancreas, adrenal glands, or spleen. No hypermetabolic lymph nodes in the abdomen or pelvis. Incidental CT findings: Stable 1.8 cm left adrenal gland nodule which is favored to represent a benign adenoma. Calcified granulomas noted within the liver and spleen. Extensive aortic atherosclerosis with branch vessel disease. SKELETON: No focal hypermetabolic activity to suggest skeletal metastasis. Incidental CT findings: none IMPRESSION: 1. The index nodule within the superior segment of the right lower lobe is FDG avid and worrisome for either primary bronchogenic neoplasm versus metastatic disease 2. Within the anteromedial aspect of the left maxillary sinus, in the region of the nasolacrimal duct, there is a focal soft tissue attenuating filling defect which exhibits intense FDG uptake within SUV max of 55.8. The although focal uptake within the paranasal sinuses may be seen with sinusitis primary neoplasm of the sinuses or nasal lacrimal duct cannot be excluded. In light of the intense FDG uptake in this area further investigation with contrast enhanced CT or MRI of the soft tissues of neck to include the maxillary sinuses is recommended. 3.  Aortic Atherosclerosis (ICD10-I70.0). 4. Coronary artery calcifications 5. Prior granulomatous disease. . Electronically Signed   By: Kerby Moors M.D.   On: 06/11/2020 15:59   CT Biopsy  Result Date: 07/02/2020 INDICATION: History of colon cancer, now with hypermetabolic right lower lobe pulmonary nodule. Please perform CT-guided biopsy to evaluate for metastatic colon cancer versus primary bronchogenic cancer. EXAM: CT-GUIDED RIGHT LOWER LOBE PULMONARY NODULE BIOPSY COMPARISON:  PET-CT-06/11/2020;  CT of the chest, abdomen and pelvis-05/30/2020; 04/12/2019 MEDICATIONS: None. ANESTHESIA/SEDATION: Fentanyl 75 mcg IV; Versed 2 mg IV Sedation time: 14 minutes; The patient was continuously monitored during the procedure by the interventional radiology nurse under my direct supervision. CONTRAST:  None COMPLICATIONS: None immediate. PROCEDURE: Informed consent was obtained from the patient following an explanation of the procedure, risks, benefits and alternatives. The patient understands,agrees and consents for the procedure. All questions were addressed. A time out was performed prior to the initiation of the procedure. The patient was positioned right lateral decubitus on the CT table and a limited chest CT was performed for procedural planning demonstrating grossly unchanged size and appearance of the approximately 1.8 x 1.4 cm nodule within the subpleural ir aspect of the superior segment of the right lower lobe (image 40, series 2). The operative site was prepped and draped in the usual sterile fashion. Under sterile conditions and local anesthesia, a 17 gauge coaxial needle was advanced into the peripheral aspect of the nodule. Positioning was confirmed with intermittent CT fluoroscopy and followed by the acquisition of 3 core needle biopsy samples with an 18 gauge core needle biopsy device. The coaxial needle was removed following deployment of a [employer] plug and superficial hemostasis was achieved with manual compression. Limited post procedural chest CT was negative for pneumothorax or additional complication. A dressing was placed. The patient tolerated the procedure well without immediate postprocedural complication. The patient was escorted to have an upright chest radiograph. IMPRESSION: Technically successful CT guided core needle core biopsy of hypermetabolic nodule within the superior segment of the right lower lobe. Electronically Signed   By: [name] M.D.   On: 07/02/2020 13:14   DG Chest  Port 1 View  Result Date: 07/02/2020 CLINICAL DATA:  Post CT-guided lung biopsy nodule superior segment  right lower lobe EXAM: PORTABLE CHEST 1 VIEW COMPARISON:  CT chest 05/30/2020 FINDINGS: Postop CABG. Heart size normal. Negative for heart failure. Atherosclerotic calcification aortic arch. Mild right lower lobe airspace disease likely atelectasis. No pneumothorax or effusion. Right lower lobe nodule not well identified on this examination. Left lung clear. IMPRESSION: No complication post needle biopsy right lower lobe nodule. No pneumothorax. Mild right lower lobe atelectasis. Electronically Signed   By: Franchot Gallo M.D.   On: 07/02/2020 13:12   Myocardial Perfusion Imaging  Result Date: 06/20/2020  The left ventricular ejection fraction is normal (55-65%).  Nuclear stress EF: 58%. No wall motion abnormalities  There was no ST segment deviation noted during stress.  The study is normal.  This is a low risk study. No evidence of ischemia or infarction. Post CABG.  Candee Furbish, MD      IMPRESSION/PLAN: 1. Stage IA2, cT1bN0M0, NSCLC, squamous cell carcinoma of the RLL. Dr. Lisbeth Renshaw discusses the pathology findings and reviews the nature of early lung cancer. He discusses that surgery is the gold standard of treatment for early disease but that for patients such as he who are not good surgical candidates, stereotactic body radiotherapy (SBRT) would be an alternative. We discussed the risks, benefits, short, and long term effects of radiotherapy, and the patient is interested in proceeding. Dr. Lisbeth Renshaw discusses the delivery and logistics of radiotherapy and anticipates a course of 5 fractions of radiotherapy with curative intent. Written consent is obtained and placed in the chart, a copy was provided to the patient. He will simulate next Wednesday. 2. Stage IIIB, [id number] adenocarcinoma of the right colon. The patient will follow up with Dr. [name] for routine surveillance.   In a visit lasting 60  minutes, greater than 50% of the time was spent face to face discussing the patient's condition, in preparation for the discussion, and coordinating the patient's care.  The above documentation reflects my direct findings during this shared patient visit. Please see the separate note by Dr. Lisbeth Renshaw on this date for the remainder of the patient's plan of care.    Carola Rhine, PAC

## 2020-07-16 ENCOUNTER — Other Ambulatory Visit: Payer: Self-pay

## 2020-07-16 ENCOUNTER — Ambulatory Visit: Payer: Medicare HMO | Admitting: Cardiology

## 2020-07-16 ENCOUNTER — Ambulatory Visit
Admission: RE | Admit: 2020-07-16 | Discharge: 2020-07-16 | Disposition: A | Discharge status: Home / Self Care | Payer: Medicare HMO | Source: Ambulatory Visit | Attending: Radiation Oncology | Admitting: Radiation Oncology

## 2020-07-16 DIAGNOSIS — C3431 Malignant neoplasm of lower lobe, right bronchus or lung: Secondary | ICD-10-CM | POA: Diagnosis not present

## 2020-07-16 DIAGNOSIS — Z510 Encounter for antineoplastic radiation therapy: Secondary | ICD-10-CM | POA: Diagnosis not present

## 2020-07-22 DIAGNOSIS — Z510 Encounter for antineoplastic radiation therapy: Secondary | ICD-10-CM | POA: Diagnosis not present

## 2020-07-22 DIAGNOSIS — C3431 Malignant neoplasm of lower lobe, right bronchus or lung: Secondary | ICD-10-CM | POA: Diagnosis not present

## 2020-07-26 ENCOUNTER — Ambulatory Visit: Payer: Medicare HMO | Attending: Internal Medicine

## 2020-07-26 ENCOUNTER — Other Ambulatory Visit: Payer: Self-pay

## 2020-07-26 DIAGNOSIS — Z23 Encounter for immunization: Secondary | ICD-10-CM

## 2020-07-26 NOTE — Progress Notes (Signed)
   Covid-19 Vaccination Clinic  Name:  Vincent Mcclure    MRN: [id number] DOB: [birth year]  07/26/2020  Vincent Mcclure was observed post Covid-19 immunization for 15 minutes without incident. He was provided with Vaccine Information Sheet and instruction to access the V-Safe system.   Vincent Mcclure was instructed to call 911 with any severe reactions post vaccine: Marland Kitchen Difficulty breathing  . Swelling of face and throat  . A fast heartbeat  . A bad rash all over body  . Dizziness and weakness

## 2020-07-30 ENCOUNTER — Other Ambulatory Visit: Payer: Self-pay

## 2020-07-30 ENCOUNTER — Ambulatory Visit
Admission: RE | Admit: 2020-07-30 | Discharge: 2020-07-30 | Disposition: A | Discharge status: Home / Self Care | Payer: Medicare HMO | Source: Ambulatory Visit | Attending: Radiation Oncology | Admitting: Radiation Oncology

## 2020-07-30 DIAGNOSIS — C3431 Malignant neoplasm of lower lobe, right bronchus or lung: Secondary | ICD-10-CM | POA: Diagnosis not present

## 2020-07-30 DIAGNOSIS — Z510 Encounter for antineoplastic radiation therapy: Secondary | ICD-10-CM | POA: Diagnosis not present

## 2020-08-01 ENCOUNTER — Other Ambulatory Visit: Payer: Self-pay

## 2020-08-01 ENCOUNTER — Ambulatory Visit
Admission: RE | Admit: 2020-08-01 | Discharge: 2020-08-01 | Disposition: A | Discharge status: Home / Self Care | Payer: Medicare HMO | Source: Ambulatory Visit | Attending: Radiation Oncology | Admitting: Radiation Oncology

## 2020-08-01 DIAGNOSIS — C3431 Malignant neoplasm of lower lobe, right bronchus or lung: Secondary | ICD-10-CM | POA: Diagnosis not present

## 2020-08-01 DIAGNOSIS — Z510 Encounter for antineoplastic radiation therapy: Secondary | ICD-10-CM | POA: Diagnosis not present

## 2020-08-04 ENCOUNTER — Other Ambulatory Visit: Payer: Self-pay

## 2020-08-04 ENCOUNTER — Ambulatory Visit
Admission: RE | Admit: 2020-08-04 | Discharge: 2020-08-04 | Disposition: A | Discharge status: Home / Self Care | Payer: Medicare HMO | Source: Ambulatory Visit | Attending: Radiation Oncology | Admitting: Radiation Oncology

## 2020-08-04 ENCOUNTER — Inpatient Hospital Stay: Payer: Medicare HMO | Attending: Nurse Practitioner

## 2020-08-04 DIAGNOSIS — I2510 Atherosclerotic heart disease of native coronary artery without angina pectoris: Secondary | ICD-10-CM | POA: Diagnosis not present

## 2020-08-04 DIAGNOSIS — Z510 Encounter for antineoplastic radiation therapy: Secondary | ICD-10-CM | POA: Diagnosis not present

## 2020-08-04 DIAGNOSIS — E876 Hypokalemia: Secondary | ICD-10-CM | POA: Diagnosis not present

## 2020-08-04 DIAGNOSIS — K219 Gastro-esophageal reflux disease without esophagitis: Secondary | ICD-10-CM | POA: Insufficient documentation

## 2020-08-04 DIAGNOSIS — Z79899 Other long term (current) drug therapy: Secondary | ICD-10-CM | POA: Diagnosis not present

## 2020-08-04 DIAGNOSIS — I252 Old myocardial infarction: Secondary | ICD-10-CM | POA: Insufficient documentation

## 2020-08-04 DIAGNOSIS — I4891 Unspecified atrial fibrillation: Secondary | ICD-10-CM | POA: Diagnosis not present

## 2020-08-04 DIAGNOSIS — Z9221 Personal history of antineoplastic chemotherapy: Secondary | ICD-10-CM | POA: Insufficient documentation

## 2020-08-04 DIAGNOSIS — E785 Hyperlipidemia, unspecified: Secondary | ICD-10-CM | POA: Diagnosis not present

## 2020-08-04 DIAGNOSIS — Z9049 Acquired absence of other specified parts of digestive tract: Secondary | ICD-10-CM | POA: Insufficient documentation

## 2020-08-04 DIAGNOSIS — C182 Malignant neoplasm of ascending colon: Secondary | ICD-10-CM

## 2020-08-04 DIAGNOSIS — C3431 Malignant neoplasm of lower lobe, right bronchus or lung: Secondary | ICD-10-CM | POA: Diagnosis not present

## 2020-08-04 DIAGNOSIS — Z923 Personal history of irradiation: Secondary | ICD-10-CM | POA: Diagnosis not present

## 2020-08-04 DIAGNOSIS — Z85038 Personal history of other malignant neoplasm of large intestine: Secondary | ICD-10-CM | POA: Diagnosis not present

## 2020-08-04 DIAGNOSIS — I10 Essential (primary) hypertension: Secondary | ICD-10-CM | POA: Diagnosis not present

## 2020-08-04 DIAGNOSIS — G629 Polyneuropathy, unspecified: Secondary | ICD-10-CM | POA: Insufficient documentation

## 2020-08-04 LAB — CBC WITH DIFFERENTIAL (CANCER CENTER ONLY)
Abs Immature Granulocytes: 0.01 10*3/uL (ref 0.00–0.07)
Basophils Absolute: 0 10*3/uL (ref 0.0–0.1)
Basophils Relative: 0 %
Eosinophils Absolute: 0.1 10*3/uL (ref 0.0–0.5)
Eosinophils Relative: 2 %
HCT: 41.4 % (ref 39.0–52.0)
Hemoglobin: 14.3 g/dL (ref 13.0–17.0)
Immature Granulocytes: 0 %
Lymphocytes Relative: 34 %
Lymphs Abs: 1.4 10*3/uL (ref 0.7–4.0)
MCH: 30.8 pg (ref 26.0–34.0)
MCHC: 34.5 g/dL (ref 30.0–36.0)
MCV: 89 fL (ref 80.0–100.0)
Monocytes Absolute: 0.6 10*3/uL (ref 0.1–1.0)
Monocytes Relative: 14 %
Neutro Abs: 2.1 10*3/uL (ref 1.7–7.7)
Neutrophils Relative %: 50 %
Platelet Count: 172 10*3/uL (ref 150–400)
RBC: 4.65 MIL/uL (ref 4.22–5.81)
RDW: 13.1 % (ref 11.5–15.5)
WBC Count: 4.2 10*3/uL (ref 4.0–10.5)
nRBC: 0 % (ref 0.0–0.2)

## 2020-08-04 LAB — CMP (CANCER CENTER ONLY)
ALT: 15 U/L (ref 0–44)
AST: 17 U/L (ref 15–41)
Albumin: 3.9 g/dL (ref 3.5–5.0)
Alkaline Phosphatase: 75 U/L (ref 38–126)
Anion gap: 6 (ref 5–15)
BUN: 18 mg/dL (ref 8–23)
CO2: 32 mmol/L (ref 22–32)
Calcium: 9.5 mg/dL (ref 8.9–10.3)
Chloride: 97 mmol/L — ABNORMAL LOW (ref 98–111)
Creatinine: 1.14 mg/dL (ref 0.61–1.24)
GFR, Estimated: >60 mL/min (ref >60)
Glucose, Bld: 88 mg/dL (ref 70–99)
Potassium: 3.3 mmol/L — ABNORMAL LOW (ref 3.5–5.1)
Sodium: 135 mmol/L (ref 135–145)
Total Bilirubin: 0.5 mg/dL (ref 0.3–1.2)
Total Protein: 6.5 g/dL (ref 6.5–8.1)

## 2020-08-04 LAB — FERRITIN: Ferritin: 300 ng/mL (ref 24–336)

## 2020-08-04 LAB — CEA (IN HOUSE-CHCC): CEA (CHCC-In House): 2.22 ng/mL (ref 0.00–5.00)

## 2020-08-05 ENCOUNTER — Ambulatory Visit (HOSPITAL_COMMUNITY)
Admission: RE | Admit: 2020-08-05 | Discharge: 2020-08-05 | Disposition: A | Discharge status: Home / Self Care | Payer: Medicare HMO | Source: Ambulatory Visit | Attending: Hematology | Admitting: Hematology

## 2020-08-05 ENCOUNTER — Encounter (HOSPITAL_COMMUNITY): Payer: Self-pay

## 2020-08-05 DIAGNOSIS — J320 Chronic maxillary sinusitis: Secondary | ICD-10-CM | POA: Diagnosis not present

## 2020-08-05 DIAGNOSIS — R930 Abnormal findings on diagnostic imaging of skull and head, not elsewhere classified: Secondary | ICD-10-CM | POA: Insufficient documentation

## 2020-08-05 DIAGNOSIS — Z85038 Personal history of other malignant neoplasm of large intestine: Secondary | ICD-10-CM | POA: Diagnosis not present

## 2020-08-05 DIAGNOSIS — J3489 Other specified disorders of nose and nasal sinuses: Secondary | ICD-10-CM | POA: Diagnosis not present

## 2020-08-05 DIAGNOSIS — Z85118 Personal history of other malignant neoplasm of bronchus and lung: Secondary | ICD-10-CM | POA: Diagnosis not present

## 2020-08-05 HISTORY — DX: Malignant neoplasm of unspecified part of unspecified bronchus or lung: C34.90

## 2020-08-05 MED ORDER — IOHEXOL 300 MG/ML  SOLN
75.0000 mL | Freq: Once | INTRAMUSCULAR | Status: AC | PRN
  Administered 2020-08-05: 75 mL via INTRAVENOUS

## 2020-08-06 ENCOUNTER — Other Ambulatory Visit: Payer: Medicare HMO

## 2020-08-06 ENCOUNTER — Other Ambulatory Visit: Payer: Self-pay

## 2020-08-06 ENCOUNTER — Ambulatory Visit
Admission: RE | Admit: 2020-08-06 | Discharge: 2020-08-06 | Disposition: A | Discharge status: Home / Self Care | Payer: Medicare HMO | Source: Ambulatory Visit | Attending: Radiation Oncology | Admitting: Radiation Oncology

## 2020-08-06 ENCOUNTER — Telehealth: Payer: Self-pay

## 2020-08-06 DIAGNOSIS — C3431 Malignant neoplasm of lower lobe, right bronchus or lung: Secondary | ICD-10-CM | POA: Diagnosis not present

## 2020-08-06 DIAGNOSIS — Z510 Encounter for antineoplastic radiation therapy: Secondary | ICD-10-CM | POA: Diagnosis not present

## 2020-08-06 NOTE — Telephone Encounter (Signed)
-----   Message from Truitt Merle, MD sent at 08/05/2020  9:24 AM EDT ----- Please let pt know his K is little low, probably related to his HCTZ. If he is willing to take KCL pill, please call in 70meq daily for a week, K-rich food. I am seeing him later this week, thanks  Truitt Merle

## 2020-08-06 NOTE — Telephone Encounter (Signed)
-----   Message from Truitt Merle, MD sent at 08/05/2020  9:24 AM EDT ----- Please let pt know his K is little low, probably related to his HCTZ. If he is willing to take KCL pill, please call in 36meq daily for a week, K-rich food. I am seeing him later this week, thanks  Truitt Merle

## 2020-08-06 NOTE — Telephone Encounter (Signed)
I left vm for [name] to return my call regarding low potassium level

## 2020-08-06 NOTE — Progress Notes (Signed)
[place]   [phone]) [phone] [phone]   Clinic Follow up Note   Patient Care Team: Patient, No Pcp Per as PCP - General (General Practice) Pyrtle, Lajuan Lines, MD as Consulting Physician (Gastroenterology) Allyn Kenner, MD as Consulting Physician (Dermatology) Dorothy Spark, MD as Consulting Physician (Cardiology) Levin Erp, [place] as Physician Assistant (Gastroenterology) Murrell Converse as Physician Assistant (Cardiology)  Date of Service:  08/08/2020  CHIEF COMPLAINT: F/u on stage III colon cancer   SUMMARY OF ONCOLOGIC HISTORY: Oncology History Overview Note  Cancer Staging Cancer of ascending colon pT4apN1 s/p right colectomy 05/19/2018 Staging form: Colon and Rectum, AJCC 8th Edition - Pathologic stage from 05/19/2018: Stage IIIB (pT4a, pN1, cM0) - Signed by Truitt Merle, MD on 05/24/2018     Cancer of ascending colon pT4apN1 s/p right colectomy 05/19/2018  05/18/2018 Imaging   05/18/2018 CT AP IMPRESSION: 1. There appears to be a nearly obstructing lesion in the region of the hepatic flexure of the colon which is highly concerning for primary colonic neoplasm. Slight haziness of the surrounding soft tissues may be indicative of early local invasion. This appears partially obstructive as evidenced by dilatation of more proximal aspects of the small bowel and colon. 2. Left adrenal lesion is stable compared to prior studies, previously characterized as an adenoma. 3. Aortic atherosclerosis, in addition to left main and 3 vessel coronary artery disease. Status post median sternotomy for CABG. 4. Additional incidental findings, as above.  Aortic Atherosclerosis (ICD10-I70.0).   05/18/2018 - 05/30/2018 Hospital Admission   Admit date: 05/18/2018 Admission diagnosis: Colon Cancer Discharged on: 05/30/2018   05/19/2018 Cancer Staging   Staging form: Colon and Rectum, AJCC 8th Edition - Pathologic stage from 05/19/2018: Stage IIIB (pT4a,  pN1, cM0) - Signed by Truitt Merle, MD on 05/24/2018    05/19/2018 Pathology Results   05/19/2018 Surgical Pathology Diagnosis Colon, segmental resection for tumor, right - INVASIVE COLORECTAL ADENOCARCINOMA, 5 CM. - CARCINOMA FOCALLY LESS THAN 0.1 CM FROM SEROSAL SURFACE. - MARGINS NOT INVOLVED. - METASTATIC CARCINOMA IN ONE OF EIGHT LYMPH NODES (1/9). - BENIGN APPENDIX.   05/19/2018 Surgery   EXPLORATORY LAPAROTOMY with right colectomy with Fanny Skates, MD   05/24/2018 Initial Diagnosis   Cancer of right colon ([place])   06/12/2018 Imaging    06/12/2018 CT CAP IMPRESSION: 1. Evidence all bowel perforation/anastomosed breakdown with large volume intraperitoneal free air fluid in the RIGHT upper quadrant adjacent and above the liver. 2. Large loculated fluid collection in the RIGHT anterior pelvis extending along the LEFT pericolic gutter consists with PERITONEAL ABSCESS. 3. Of note, no large volume intraperitoneal free fluid. Oral contrast passed through the RIGHT colon anastomosis without evidence of leak. Findings could indicate a prior anastomotic breakdown which has healed in the interval with subsequent abscess formation from the prior leak / breakdown.   06/17/2018 Procedure   06/17/2018 US Thoracentesis IMPRESSION: Successful ultrasound guided right thoracentesis yielding 1 L of pleural fluid. No pneumothorax on post-procedure chest x-ray.  No malignancy on pathology   06/20/2018 Imaging   06/20/2018 CT AP IMPRESSION: 1. Decompression of right upper quadrant air and fluid collection after percutaneous catheter drainage. There remains a tiny rim of fluid as well as small extraluminal gas bubbles in this region suspicious for continued leak from bowel. 2. Further decrease in size of residual abscess fluid collection in the pelvis after percutaneous catheter drainage. There remains some fluid around the drainage catheter in the right pelvis.   07/04/2018 Imaging  07/04/2018  CT AP IMPRESSION: 1. Further decompression of right perihepatic abscess with resolution of extraluminal air. 2. Relatively stable volume of fluid surrounding the right pelvic drainage catheter.   07/05/2018 Imaging   07/05/2018 CT AP IMPRESSION: 1. No change compared to the CT scan performed 9 hours earlier. 2. Anterior right perihepatic space collection is decompressed by indwelling percutaneous drain, with no residual measurable collection in this location. 3. Thick walled right lower quadrant collection with indwelling percutaneous drain is stable. No new focal fluid collections. 4. Stable postsurgical changes from right hemicolectomy with no evidence of bowel obstruction. Stable reactive wall thickening in the sigmoid colon adjacent to the right lower quadrant collection. 5. Stable small to moderate dependent right pleural effusion. 6. Stable left adrenal adenoma. 7.  Aortic Atherosclerosis (ICD10-I70.0).   07/31/2018 - 01/21/2019 Chemotherapy   Adjuvant Xeloda 2000mg  bid, 7 days on, 7 days off.  Reduced to 2000mg  in the AM and 1500mg  in the PM due to diarrhea. Completed on 01/21/19    04/12/2019 Imaging   CT CAP W contrast 04/12/19  IMPRESSION: 1. Status post right hemicolectomy. 2. No definite metastatic disease in the chest, abdomen, or pelvis. 7 mm right lower lobe pulmonary nodule, obscured by atelectasis on previous chest CT and not included on multiple prior abdomen / pelvis CTs. Close follow-up recommended to ensure stability. 3.  Aortic Atherosclerois (ICD10-170.0)   09/04/2019 Imaging   CT AP W Contrast I MPRESSION: 1. No evidence of local colon cancer recurrence at the RIGHT hemicolectomy site. 2. No evidence of metastatic disease in the abdomen pelvis. 3. Mild nodularity at the LEFT lung base is favored benign. Recommend attention on routine surveillance 4. Aortic Atherosclerosis (ICD10-I70.0).   07/02/2020 Relapse/Recurrence   FINAL MICROSCOPIC DIAGNOSIS:    A. LUNG, RIGHT LOWER LOBE, NEEDLE CORE BIOPSY:  - Non-small cell carcinoma, see comment    COMMENT:   Immunohistochemical stains for characterization of the tumor phenotype  are pending and will be reported in an addendum.  Dr. Tresa Moore reviewed the  case and concurs with the diagnosis. Dr. [name] was notified on 07/03/2020.    Malignant neoplasm of bronchus of right lower lobe ([place])  05/30/2020 Imaging   CT CAP w contrast IMPRESSION: 1. Significant enlargement of a right lower lobe pulmonary nodule. Suspicious for primary bronchogenic carcinoma. Isolated pulmonary metastasis felt less likely. 2. Otherwise, no evidence of metastatic disease from patient's colon cancer. 3. Aortic atherosclerosis (ICD10-I70.0) and emphysema (ICD10-J43.9). 4. Left adrenal adenoma.   These results will be called to the ordering clinician or representative by the Radiologist Assistant, and communication documented in the PACS or [employer].   06/11/2020 PET scan   IMPRESSION: 1. The index nodule within the superior segment of the right lower lobe is FDG avid and worrisome for either primary bronchogenic neoplasm versus metastatic disease 2. Within the anteromedial aspect of the left maxillary sinus, in the region of the nasolacrimal duct, there is a focal soft tissue attenuating filling defect which exhibits intense FDG uptake within SUV max of 55.8. The although focal uptake within the paranasal sinuses may be seen with sinusitis primary neoplasm of the sinuses or nasal lacrimal duct cannot be excluded. In light of the intense FDG uptake in this area further investigation with contrast enhanced CT or MRI of the soft tissues of neck to include the maxillary sinuses is recommended. 3.  Aortic Atherosclerosis (ICD10-I70.0). 4. Coronary artery calcifications 5. Prior granulomatous disease.     07/02/2020 Cancer Staging  Staging form: Lung, AJCC 8th Edition - Clinical stage from 07/02/2020:  Stage IA2 (cT1b, cN0, cM0) - Signed by Truitt Merle, MD on 07/06/2020   07/06/2020 Initial Diagnosis   Non-small cell lung cancer, right ([place])   07/30/2020 - 08/08/2020 Radiation Therapy   SBRT to lung 07/16/20-08/04/20 with Dr Lisbeth Renshaw.       CURRENT THERAPY:  Surveillance  INTERVAL HISTORY:  Vincent Mcclure is here for a follow up. He presents to the clinic with his wife. He notes he is doing well. He notes RT is doing well. He noes he continues to f/u with cardiologist after recent Afib found by EMT. He notes he saw an ENT 4 years ago, but is ok being referred to a new one.    REVIEW OF SYSTEMS:   Constitutional: Denies fevers, chills or abnormal weight loss Eyes: Denies blurriness of vision Ears, nose, mouth, throat, and face: Denies mucositis or sore throat Respiratory: Denies cough, dyspnea or wheezes Cardiovascular: Denies palpitation, chest discomfort or lower extremity swelling Gastrointestinal:  Denies nausea, heartburn or change in bowel habits Skin: Denies abnormal skin rashes Lymphatics: Denies new lymphadenopathy or easy bruising Neurological:Denies numbness, tingling or new weaknesses Behavioral/Psych: Mood is stable, no new changes  All other systems were reviewed with the patient and are negative.  MEDICAL HISTORY:  Past Medical History:  Diagnosis Date  . Alcohol abuse   . Anemia   . Arthritis    hands  . Blood transfusion without reported diagnosis   . Chronic low back pain 10/14/2011  . colon ca dx'd 05/18/18   colon cancer  . Colon polyps    hyperplastic (2004, 2010) and adenomatous (1990).    . COLONIC POLYPS, HX OF 03/05/2008  . Esophageal stricture    hx of  . Gastric ulcer   . GERD 03/05/2008  . GERD (gastroesophageal reflux disease) 1994   associated peptic strictures  . HYPERLIPIDEMIA 03/05/2008  . HYPERTENSION 03/05/2008  . Hypertension   . IBS 03/05/2008  . Impaired glucose tolerance 10/12/2011  . Iron deficiency anemia   . LEG CRAMPS 09/02/2010  .  Lung cancer ([place]) dx'd 06/2020  . Myocardial infarction [facility])    May 2018  . PAD (peripheral artery disease) ([place]) 10/14/2011  . PAD (peripheral artery disease) ([place]) 2013  . PERIPHERAL EDEMA 09/02/2010  . PERIPHERAL NEUROPATHY 03/05/2008  . Prostatitis    hx of  . VARICOSE VEINS, LOWER EXTREMITIES 06/09/2009    SURGICAL HISTORY: Past Surgical History:  Procedure Laterality Date  . CATARACT EXTRACTION  bilat  . COLECTOMY    . COLONOSCOPY    . CORONARY ARTERY BYPASS GRAFT N/A 02/15/2017   Procedure: CORONARY ARTERY BYPASS GRAFTING times three  with left internal mammary harvest and endoscopic harvest of Right SVG. Grafts of LIMA to  LAD, SVG to Distal Circ, and to First Diag.;  Surgeon: Grace Isaac, MD;  Location: [facility];  Service: Open Heart Surgery;  Laterality: N/A;  . ESOPHAGOGASTRODUODENOSCOPY N/A 11/14/2014   Procedure: ESOPHAGOGASTRODUODENOSCOPY (EGD);  Surgeon: Jerene Bears, MD;  Location: [facility] ENDOSCOPY;  Service: Endoscopy;  Laterality: N/A;  . IR RADIOLOGIST EVAL & MGMT  07/04/2018  . IR RADIOLOGIST EVAL & MGMT  07/18/2018  . LAPAROTOMY N/A 05/19/2018   Procedure: EXPLORATORY LAPAROTOMY RIGHT COLECTOMY;  Surgeon: Fanny Skates, MD;  Location: [facility] ORS;  Service: General;  Laterality: N/A;  . LEFT HEART CATH AND CORONARY ANGIOGRAPHY N/A 02/14/2017   Procedure: Left Heart Cath and Coronary Angiography;  Surgeon: Tamala Julian,  Lynnell Dike, MD;  Location: [facility];  Service: Cardiovascular;  Laterality: N/A;  . LUMBAR SPINE SURGERY  11/2008   Dr Joya Salm  . TEE WITHOUT CARDIOVERSION N/A 02/15/2017   Procedure: TRANSESOPHAGEAL ECHOCARDIOGRAM (TEE);  Surgeon: Grace Isaac, MD;  Location: [facility];  Service: Open Heart Surgery;  Laterality: N/A;  . UPPER GASTROINTESTINAL ENDOSCOPY      I have reviewed the social history and family history with the patient and they are unchanged from previous note.  ALLERGIES:  has No Known Allergies.  MEDICATIONS:  Current Outpatient Medications   Medication Sig Dispense Refill  . acetaminophen (TYLENOL) 500 MG tablet Take 1 tablet (500 mg total) by mouth every 6 (six) hours as needed for mild pain.  0  . bacitracin 500 UNIT/GM ointment Apply 1 application topically daily as needed for wound care.    . Cholecalciferol (VITAMIN D-3 PO) Take 2 capsules by mouth daily.     . ferrous sulfate 325 (65 FE) MG EC tablet Take 325 mg by mouth daily.    . hydrochlorothiazide (HYDRODIURIL) 25 MG tablet Take 1 tablet (25 mg total) by mouth daily. 90 tablet 2  . isosorbide mononitrate (IMDUR) 30 MG 24 hr tablet Take 0.5 tablets (15 mg total) by mouth daily. 45 tablet 2  . metoprolol succinate (TOPROL-XL) 50 MG 24 hr tablet Take 1 tablet (50 mg total) by mouth daily. Take with or immediately following a meal. 90 tablet 3  . Multiple Vitamins-Minerals (PRESERVISION AREDS 2 PO) Take 1 tablet by mouth daily.    . Naphazoline HCl (CLEAR EYES OP) Place 1 drop into both eyes daily as needed (burning eyes).    . nitroGLYCERIN (NITROSTAT) 0.4 MG SL tablet Place 1 tablet (0.4 mg total) under the tongue every 5 (five) minutes as needed for chest pain (or tightness). 25 tablet 3  . potassium chloride (KLOR-CON) 10 MEQ tablet Take 1 tablet (10 mEq total) by mouth daily. 7 tablet 1  . pravastatin (PRAVACHOL) 40 MG tablet TAKE 1 TABLET BY MOUTH AT BEDTIME 90 tablet 3   No current facility-administered medications for this visit.    PHYSICAL EXAMINATION: ECOG PERFORMANCE STATUS: 1 - Symptomatic but completely ambulatory  Vitals:   08/08/20 1005  BP: 125/81  Pulse: (!) 103  Resp: 18  Temp: (!) 96.6 F (35.9 C)  SpO2: 93%   Filed Weights   08/08/20 1005  Weight: 234 lb 8 oz (106.4 kg)    GENERAL:alert, no distress and comfortable SKIN: skin color, texture, turgor are normal, no rashes or significant lesions EYES: normal, Conjunctiva are pink and non-injected, sclera clear  NECK: supple, thyroid normal size, non-tender, without nodularity LYMPH:  no  palpable lymphadenopathy in the cervical, axillary  LUNGS: clear to auscultation and percussion with normal breathing effort HEART: and no murmurs and no lower extremity edema (+) Afib ABDOMEN:abdomen soft, non-tender and normal bowel sounds Musculoskeletal:no cyanosis of digits and no clubbing  NEURO: alert & oriented x 3 with fluent speech, no focal motor/sensory deficits  LABORATORY DATA:  I have reviewed the data as listed CBC Latest Ref Rng & Units 08/04/2020 07/02/2020 05/28/2020  WBC 4.0 - 10.5 K/uL 4.2 5.4 3.6(L)  Hemoglobin 13.0 - 17.0 g/dL 14.3 16.2 15.1  Hematocrit 39 - 52 % 41.4 48.9 45.0  Platelets 150 - 400 K/uL 172 191 162     CMP Latest Ref Rng & Units 08/04/2020 05/28/2020 12/05/2019  Glucose 70 - 99 mg/dL 88 145(H) 126(H)  BUN 8 -  23 mg/dL 18 13 14   Creatinine 0.61 - 1.24 mg/dL 1.14 1.14 1.09  Sodium 135 - 145 mmol/L 135 136 139  Potassium 3.5 - 5.1 mmol/L 3.3(L) 3.6 3.9  Chloride 98 - 111 mmol/L 97(L) 98 104  CO2 22 - 32 mmol/L 32 29 26  Calcium 8.9 - 10.3 mg/dL 9.5 10.0 9.0  Total Protein 6.5 - 8.1 g/dL 6.5 6.5 6.7  Total Bilirubin 0.3 - 1.2 mg/dL 0.5 0.9 0.6  Alkaline Phos 38 - 126 U/L 75 93 88  AST 15 - 41 U/L 17 16 15   ALT 0 - 44 U/L 15 11 13       RADIOGRAPHIC STUDIES: I have personally reviewed the radiological images as listed and agreed with the findings in the report. No results found.   ASSESSMENT & PLAN:  ZAIM NITTA is a 86 y.o. male with   1.  Non-small cell lung cancer in right lower lobe, squamous cell carcinoma, cT1bN0M0 stage IA -This was discovered on his 05/30/20 surveillance CT scan for colon cancer, the lesion was hypermetabolic on 04/12/41 PET, no other distant metastasis. -His 07/02/20 fine-needle biopsy of the lung mass showed squamous cell carcinoma. He had a history of heavy smoking. -Given the very early stage lung cancer, he is a candidate for lobectomy or SBRT. No adjuvant chemotherapy is recommended. Due to his advanced age and  significant comorbidities, especially heart disease, I think he is a poor candidate for surgery. -He was treated with SBRT to lung 07/30/20-08/08/20 with Dr Lisbeth Renshaw. He has tolerated well with some fatigue.  -We will monitor cancer recurrence after definitive treatment with CT chest every 3-12 months for next 5 years.  -Labs reviewed from last week shows hypokalemia. I will start him on oral potassium and increase potassium in diet.  -F/u in 3 months with CT chest wo contrast    2. Nasal Mass -He was found to have a hypermetabilic left maxillary sinus lesion. His CT [place] on 08/05/20 showed 21mm mass in the left nasal lacrimal duct. It has slightly enlarged since 2016 I reviewed with patient today.  -I recommend ENT consult. Pt states he would like to see the ENT physician he saw 4 years ago, he coul dnot tell me the name. He will call for appointment him self. I told him to contact me if needed for his referral    3. Adenocarcinoma of right colon, invasive adenocarcinoma, stage IIIB (pT4a, pN1, cM0), MSS -Diagnosed in 05/2018. Treated with hemicolectomy. HereceivedadjuvantXeloda 2000 mg BID one week on, one week offfrom10/21/2019 to 01/29/2019.  -he is clinically doing well, no evidence of recurrence so far -We will continue monitoring. -plan to repeat CT scan in sep 2022  4. CAD S/P CABG, Afib  -f/u withDr Opalski and Dr Meda Coffee.Continue on medications -Per recent EMT visit he was found to have Afib. He is still in mild Afib on exam today (08/08/20). He will proceed with f/u with his cardiologist 08/11/20  5. 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   Orders Placed This Encounter  Procedures  . CT Chest Wo Contrast    Standing Status:   Future    Standing Expiration Date:  08/08/2021    Order Specific Question:   Preferred imaging location?    Answer:   [facility]   All questions were answered. The patient knows to call the clinic with any problems, questions or concerns. No barriers to learning was detected. The total time spent in the appointment was 30 minutes.     Truitt Merle, MD 08/08/2020   I, Joslyn Devon, am acting as scribe for Truitt Merle, MD.   I have reviewed the above documentation for accuracy and completeness, and I agree with the above.

## 2020-08-07 ENCOUNTER — Other Ambulatory Visit: Payer: Self-pay

## 2020-08-07 DIAGNOSIS — E876 Hypokalemia: Secondary | ICD-10-CM

## 2020-08-07 MED ORDER — POTASSIUM CHLORIDE ER 10 MEQ PO TBCR: 10.0000 meq | EXTENDED_RELEASE_TABLET | Freq: Every day | ORAL | 1 refills | Status: DC

## 2020-08-08 ENCOUNTER — Inpatient Hospital Stay: Payer: Medicare HMO | Admitting: Hematology

## 2020-08-08 ENCOUNTER — Encounter: Payer: Self-pay | Admitting: Hematology

## 2020-08-08 ENCOUNTER — Encounter: Payer: Self-pay | Admitting: Radiation Oncology

## 2020-08-08 ENCOUNTER — Other Ambulatory Visit: Payer: Self-pay

## 2020-08-08 ENCOUNTER — Ambulatory Visit
Admission: RE | Admit: 2020-08-08 | Discharge: 2020-08-08 | Disposition: A | Discharge status: Home / Self Care | Payer: Medicare HMO | Source: Ambulatory Visit | Attending: Radiation Oncology | Admitting: Radiation Oncology

## 2020-08-08 VITALS — BP 125/81 | HR 103 | Temp 96.6°F | Resp 18 | Ht 71.0 in | Wt 234.5 lb

## 2020-08-08 DIAGNOSIS — C3431 Malignant neoplasm of lower lobe, right bronchus or lung: Secondary | ICD-10-CM | POA: Diagnosis not present

## 2020-08-08 DIAGNOSIS — C182 Malignant neoplasm of ascending colon: Secondary | ICD-10-CM | POA: Diagnosis not present

## 2020-08-08 DIAGNOSIS — I2510 Atherosclerotic heart disease of native coronary artery without angina pectoris: Secondary | ICD-10-CM | POA: Diagnosis not present

## 2020-08-08 DIAGNOSIS — I10 Essential (primary) hypertension: Secondary | ICD-10-CM

## 2020-08-08 DIAGNOSIS — I252 Old myocardial infarction: Secondary | ICD-10-CM | POA: Diagnosis not present

## 2020-08-08 DIAGNOSIS — G629 Polyneuropathy, unspecified: Secondary | ICD-10-CM | POA: Diagnosis not present

## 2020-08-08 DIAGNOSIS — E785 Hyperlipidemia, unspecified: Secondary | ICD-10-CM | POA: Diagnosis not present

## 2020-08-08 DIAGNOSIS — E876 Hypokalemia: Secondary | ICD-10-CM | POA: Diagnosis not present

## 2020-08-08 DIAGNOSIS — Z510 Encounter for antineoplastic radiation therapy: Secondary | ICD-10-CM | POA: Diagnosis not present

## 2020-08-08 DIAGNOSIS — Z85038 Personal history of other malignant neoplasm of large intestine: Secondary | ICD-10-CM | POA: Diagnosis not present

## 2020-08-08 DIAGNOSIS — Z923 Personal history of irradiation: Secondary | ICD-10-CM | POA: Diagnosis not present

## 2020-08-11 ENCOUNTER — Telehealth: Payer: Self-pay | Admitting: Hematology

## 2020-08-11 ENCOUNTER — Telehealth: Payer: Self-pay

## 2020-08-11 NOTE — Telephone Encounter (Signed)
Scheduled per 10/29 los. Pt is aware of appt times and dates.

## 2020-08-11 NOTE — Telephone Encounter (Signed)
Mr Benoit called requesting Dr Pollie Friar phone number, [phone]

## 2020-08-14 ENCOUNTER — Other Ambulatory Visit: Payer: Self-pay

## 2020-08-14 ENCOUNTER — Ambulatory Visit (INDEPENDENT_AMBULATORY_CARE_PROVIDER_SITE_OTHER): Payer: Medicare HMO | Admitting: Otolaryngology

## 2020-08-14 ENCOUNTER — Encounter (INDEPENDENT_AMBULATORY_CARE_PROVIDER_SITE_OTHER): Payer: Self-pay | Admitting: Otolaryngology

## 2020-08-14 VITALS — Temp 96.8°F

## 2020-08-14 DIAGNOSIS — D4989 Neoplasm of unspecified behavior of other specified sites: Secondary | ICD-10-CM

## 2020-08-14 NOTE — Progress Notes (Signed)
HPI: Vincent Mcclure is a 86 y.o. male who presents is referred by Dr. [name] For evaluation of left lacrimal duct mass that lit up on recent PET scan.  Patient apparently has a history of colon cancer and a new lung nodule that prompted patient undergoing a PET scan.Marland Kitchen  Apparently the PET scan demonstrated a 1.7 cm right lower lobe nodule that was PET active and consistent with possible bronchogenic carcinoma versus metastatic disease.  But he also had a nodule within the left lacrimal duct region that was very PET active.  He subsequently had a CT scan that showed a 15 mm mass within the lacrimal duct on the left side.  This was seen previously on a CT scan in 2016 but at that time measured only about 5 mm. On questioning the patient he has noticed increased tearing from the left eye over the past year.  He has had a little pressure involving the left eye but not a lot of pain.  Past Medical History:  Diagnosis Date  . Alcohol abuse   . Anemia   . Arthritis    hands  . Blood transfusion without reported diagnosis   . Chronic low back pain 10/14/2011  . colon ca dx'd 05/18/18   colon cancer  . Colon polyps    hyperplastic (2004, 2010) and adenomatous (1990).    . COLONIC POLYPS, HX OF 03/05/2008  . Esophageal stricture    hx of  . Gastric ulcer   . GERD 03/05/2008  . GERD (gastroesophageal reflux disease) 1994   associated peptic strictures  . HYPERLIPIDEMIA 03/05/2008  . HYPERTENSION 03/05/2008  . Hypertension   . IBS 03/05/2008  . Impaired glucose tolerance 10/12/2011  . Iron deficiency anemia   . LEG CRAMPS 09/02/2010  . Lung cancer ([place]) dx'd 06/2020  . Myocardial infarction [facility])    May 2018  . PAD (peripheral artery disease) ([place]) 10/14/2011  . PAD (peripheral artery disease) ([place]) 2013  . PERIPHERAL EDEMA 09/02/2010  . PERIPHERAL NEUROPATHY 03/05/2008  . Prostatitis    hx of  . VARICOSE VEINS, LOWER EXTREMITIES 06/09/2009   Past Surgical History:  Procedure Laterality Date  . CATARACT  EXTRACTION  bilat  . COLECTOMY    . COLONOSCOPY    . CORONARY ARTERY BYPASS GRAFT N/A 02/15/2017   Procedure: CORONARY ARTERY BYPASS GRAFTING times three  with left internal mammary harvest and endoscopic harvest of Right SVG. Grafts of LIMA to  LAD, SVG to Distal Circ, and to First Diag.;  Surgeon: Grace Isaac, MD;  Location: [place];  Service: Open Heart Surgery;  Laterality: N/A;  . ESOPHAGOGASTRODUODENOSCOPY N/A 11/14/2014   Procedure: ESOPHAGOGASTRODUODENOSCOPY (EGD);  Surgeon: Jerene Bears, MD;  Location: [facility] ENDOSCOPY;  Service: Endoscopy;  Laterality: N/A;  . IR RADIOLOGIST EVAL & MGMT  07/04/2018  . IR RADIOLOGIST EVAL & MGMT  07/18/2018  . LAPAROTOMY N/A 05/19/2018   Procedure: EXPLORATORY LAPAROTOMY RIGHT COLECTOMY;  Surgeon: Fanny Skates, MD;  Location: [facility] ORS;  Service: General;  Laterality: N/A;  . LEFT HEART CATH AND CORONARY ANGIOGRAPHY N/A 02/14/2017   Procedure: Left Heart Cath and Coronary Angiography;  Surgeon: Belva Crome, MD;  Location: [place] [facility];  Service: Cardiovascular;  Laterality: N/A;  . LUMBAR SPINE SURGERY  11/2008   Dr Joya Salm  . TEE WITHOUT CARDIOVERSION N/A 02/15/2017   Procedure: TRANSESOPHAGEAL ECHOCARDIOGRAM (TEE);  Surgeon: Grace Isaac, MD;  Location: [place];  Service: Open Heart Surgery;  Laterality: N/A;  .  UPPER GASTROINTESTINAL ENDOSCOPY     Social History   Socioeconomic History  . Marital status: Married    Spouse name: Not on file  . Number of children: 4  . Years of education: Not on file  . Highest education level: Not on file  Occupational History  . Occupation: Retired Engineer, maintenance  Tobacco Use  . Smoking status: Former Smoker    Packs/day: 1.25    Years: 50.00    Pack years: 62.50    Types: Cigarettes    Quit date: 11/11/2008    Years since quitting: 11.7  . Smokeless tobacco: Former Systems developer    Types: Chew  . Tobacco comment: smoked off and on for years  Vaping Use  . Vaping Use: Never used  Substance and Sexual  Activity  . Alcohol use: Yes    Alcohol/week: 6.0 standard drinks    Types: 2 Glasses of wine, 4 Cans of beer per week    Comment: vodka  . Drug use: No  . Sexual activity: Never  Other Topics Concern  . Not on file  Social History Narrative   ** Merged History Encounter **       4 sons   Social Determinants of Health   Financial Resource Strain:   . Difficulty of Paying Living Expenses: Not on file  Food Insecurity:   . Worried About Charity fundraiser in the Last Year: Not on file  . Ran Out of Food in the Last Year: Not on file  Transportation Needs:   . Lack of Transportation (Medical): Not on file  . Lack of Transportation (Non-Medical): Not on file  Physical Activity:   . Days of Exercise per Week: Not on file  . Minutes of Exercise per Session: Not on file  Stress:   . Feeling of Stress : Not on file  Social Connections:   . Frequency of Communication with Friends and Family: Not on file  . Frequency of Social Gatherings with Friends and Family: Not on file  . Attends Religious Services: Not on file  . Active Member of Clubs or Organizations: Not on file  . Attends Archivist Meetings: Not on file  . Marital Status: Not on file   Family History  Problem Relation Age of Onset  . Cancer Mother        Brain Cancer  . Diabetes Father   . Heart disease Father        CAD  . Hypertension Father   . Stroke Father   . Diabetes Sister   . Stomach cancer Neg Hx   . Pancreatic cancer Neg Hx   . Colon cancer Neg Hx   . Esophageal cancer Neg Hx   . Rectal cancer Neg Hx    No Known Allergies Prior to Admission medications   Medication Sig Start Date End Date Taking? Authorizing Provider  acetaminophen (TYLENOL) 500 MG tablet Take 1 tablet (500 mg total) by mouth every 6 (six) hours as needed for mild pain. 05/30/18  Yes Barkley Boards R, PA-C  bacitracin 500 UNIT/GM ointment Apply 1 application topically daily as needed for wound care.   Yes [provider]  Cholecalciferol (VITAMIN D-3 PO) Take 2 capsules by mouth daily.    Yes [provider]  ferrous sulfate 325 (65 FE) MG EC tablet Take 325 mg by mouth daily.   Yes [provider]  hydrochlorothiazide (HYDRODIURIL) 25 MG tablet Take 1 tablet (25 mg total) by mouth daily. 03/19/20  Yes Dorothy Spark, MD  isosorbide mononitrate (IMDUR) 30 MG 24 hr tablet Take 0.5 tablets (15 mg total) by mouth daily. 06/19/20  Yes Dorothy Spark, MD  metoprolol succinate (TOPROL-XL) 50 MG 24 hr tablet Take 1 tablet (50 mg total) by mouth daily. Take with or immediately following a meal. 11/02/19  Yes Dorothy Spark, MD  Multiple Vitamins-Minerals (PRESERVISION AREDS 2 PO) Take 1 tablet by mouth daily.   Yes [provider]  Naphazoline HCl (CLEAR EYES OP) Place 1 drop into both eyes daily as needed (burning eyes).   Yes [provider]  nitroGLYCERIN (NITROSTAT) 0.4 MG SL tablet Place 1 tablet (0.4 mg total) under the tongue every 5 (five) minutes as needed for chest pain (or tightness). 03/19/20  Yes Dorothy Spark, MD  potassium chloride (KLOR-CON) 10 MEQ tablet Take 1 tablet (10 mEq total) by mouth daily. 08/07/20  Yes Truitt Merle, MD  pravastatin (PRAVACHOL) 40 MG tablet TAKE 1 TABLET BY MOUTH AT BEDTIME 11/02/19  Yes Dorothy Spark, MD     Positive ROS: Otherwise negative  All other systems have been reviewed and were otherwise negative with the exception of those mentioned in the HPI and as above.  Physical Exam: Constitutional: Alert, well-appearing, no acute distress Ears: External ears without lesions or tenderness. Ear canals are clear bilaterally with intact, clear TMs.  Nasal: External nose without lesions.  There is no palpable lesion externally in the region of the lacrimal sac on the left side.  Intranasal exam reveals clear nasal passages with no obvious intranasal abnormality noted. Oral: Lips and gums without lesions. Tongue and  palate mucosa without lesions. Posterior oropharynx clear. Neck: No palpable adenopathy or masses Respiratory: Breathing comfortably  Skin: No facial/neck lesions or rash noted.  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  Plan: This would be best addressed by one of the oculoplastic surgeons. I will attempt to discuss the patient with them tomorrow as well as contact his oncologist Dr. [name].   Radene Journey, MD   CC:

## 2020-08-18 ENCOUNTER — Telehealth (INDEPENDENT_AMBULATORY_CARE_PROVIDER_SITE_OTHER): Payer: Self-pay | Admitting: Otolaryngology

## 2020-08-18 ENCOUNTER — Telehealth: Payer: Self-pay | Admitting: General Practice

## 2020-08-18 ENCOUNTER — Other Ambulatory Visit: Payer: Self-pay | Admitting: Otolaryngology

## 2020-08-18 DIAGNOSIS — D4989 Neoplasm of unspecified behavior of other specified sites: Secondary | ICD-10-CM

## 2020-08-18 NOTE — Telephone Encounter (Signed)
Progress Notes  Mickel Crow, CMA at 08/18/2020 11:22 AM  Status: Signed  Patient has not been seen in our office in over a year. Patient has received treatment for Lucrimal Tumor from Dr. Radene Journey. Called Dr. Jennell Corner office and spoke directly to physician. I asked Dr. Lucia Gaskins if he would place referral since he has seen patient recently for this issue. Dr. Lucia Gaskins was unsure how to place referral and gave verbal orders to me to place referral for patient for Dr. Lorina Rabon an oculoplastic ophthalmologist for biopsy and treatment of his lacrimal duct tumor.     Verbal orders given by Dr. Lucia Gaskins and referral has been placed. AS, CMA

## 2020-08-18 NOTE — Progress Notes (Signed)
Patient has not been seen in our office in over a year. Patient has received treatment for Lucrimal Tumor from Dr. Radene Journey. Called Dr. Jennell Corner office and spoke directly to physician. I asked Dr. Lucia Gaskins if he would place referral since he has seen patient recently for this issue. Dr. Lucia Gaskins was unsure how to place referral and gave verbal orders to me to place referral for patient for Dr. Lorina Rabon an oculoplastic ophthalmologist for biopsy and treatment of his lacrimal duct tumor.   Verbal orders given by Dr. Lucia Gaskins and referral has been placed. AS, CMA

## 2020-08-18 NOTE — Telephone Encounter (Signed)
Patient needs a referral to [facility] [place] is the office number. Doctor is Sales promotion account executive. Left sinus, round soft tissue, growth near tear area.

## 2020-08-18 NOTE — Telephone Encounter (Signed)
I called Vincent Mcclure concerning his appointment with Dr. Lorina Rabon an oculoplastic ophthalmologist for biopsy and treatment of his lacrimal duct tumor. I also had briefly discussed the patient with Dr. Caryl Never at [facility] concerning seeing this patient and he was also agreeable to seeing the patient.  Patient has an appointment to see Dr. Lorina Rabon next week.

## 2020-08-19 ENCOUNTER — Telehealth: Payer: Self-pay

## 2020-08-19 NOTE — Telephone Encounter (Signed)
Written request for CD of maxillofacial ct done on 08/05/2020 to be made. Faxed to [facility] Radiology department at [phone].  I requested the cd be mailed to Dr. Isidoro Donning, [facility] [address], [place], [place] [address]

## 2020-08-25 DIAGNOSIS — H04221 Epiphora due to insufficient drainage, right lacrimal gland: Secondary | ICD-10-CM | POA: Diagnosis not present

## 2020-08-25 DIAGNOSIS — H04223 Epiphora due to insufficient drainage, bilateral lacrimal glands: Secondary | ICD-10-CM | POA: Diagnosis not present

## 2020-08-25 DIAGNOSIS — H04222 Epiphora due to insufficient drainage, left lacrimal gland: Secondary | ICD-10-CM | POA: Diagnosis not present

## 2020-08-25 DIAGNOSIS — H02135 Senile ectropion of left lower eyelid: Secondary | ICD-10-CM | POA: Diagnosis not present

## 2020-08-25 DIAGNOSIS — H02132 Senile ectropion of right lower eyelid: Secondary | ICD-10-CM | POA: Diagnosis not present

## 2020-08-25 DIAGNOSIS — I509 Heart failure, unspecified: Secondary | ICD-10-CM | POA: Diagnosis not present

## 2020-08-25 DIAGNOSIS — H04552 Acquired stenosis of left nasolacrimal duct: Secondary | ICD-10-CM | POA: Diagnosis not present

## 2020-08-25 DIAGNOSIS — R220 Localized swelling, mass and lump, head: Secondary | ICD-10-CM | POA: Diagnosis not present

## 2020-08-25 DIAGNOSIS — H04563 Stenosis of bilateral lacrimal punctum: Secondary | ICD-10-CM | POA: Diagnosis not present

## 2020-08-25 NOTE — Telephone Encounter (Signed)
Received a call from Dr Lorina Rabon ofc asking about CT results. Informed We are not the facility who ordered CT.

## 2020-08-26 DIAGNOSIS — H35373 Puckering of macula, bilateral: Secondary | ICD-10-CM | POA: Diagnosis not present

## 2020-08-26 DIAGNOSIS — H35033 Hypertensive retinopathy, bilateral: Secondary | ICD-10-CM | POA: Diagnosis not present

## 2020-08-26 DIAGNOSIS — H353132 Nonexudative age-related macular degeneration, bilateral, intermediate dry stage: Secondary | ICD-10-CM | POA: Diagnosis not present

## 2020-08-26 DIAGNOSIS — H43822 Vitreomacular adhesion, left eye: Secondary | ICD-10-CM | POA: Diagnosis not present

## 2020-08-27 ENCOUNTER — Other Ambulatory Visit: Payer: Self-pay | Admitting: Ophthalmology

## 2020-08-27 ENCOUNTER — Other Ambulatory Visit (HOSPITAL_COMMUNITY): Payer: Self-pay | Admitting: Ophthalmology

## 2020-08-27 DIAGNOSIS — J3489 Other specified disorders of nose and nasal sinuses: Secondary | ICD-10-CM

## 2020-08-27 DIAGNOSIS — R220 Localized swelling, mass and lump, head: Secondary | ICD-10-CM

## 2020-08-29 ENCOUNTER — Telehealth: Payer: Self-pay | Admitting: Hematology

## 2020-08-29 NOTE — Telephone Encounter (Signed)
Released records to [facility] to [phone]   Release:  [phone]

## 2020-09-08 ENCOUNTER — Ambulatory Visit (HOSPITAL_COMMUNITY)
Admission: RE | Admit: 2020-09-08 | Discharge: 2020-09-08 | Disposition: A | Discharge status: Home / Self Care | Payer: Medicare HMO | Source: Ambulatory Visit | Attending: Ophthalmology | Admitting: Ophthalmology

## 2020-09-08 ENCOUNTER — Other Ambulatory Visit: Payer: Self-pay

## 2020-09-08 ENCOUNTER — Telehealth: Payer: Self-pay | Admitting: Radiation Oncology

## 2020-09-08 DIAGNOSIS — J3489 Other specified disorders of nose and nasal sinuses: Secondary | ICD-10-CM | POA: Diagnosis not present

## 2020-09-08 DIAGNOSIS — R220 Localized swelling, mass and lump, head: Secondary | ICD-10-CM

## 2020-09-08 MED ORDER — GADOBUTROL 1 MMOL/ML IV SOLN
10.0000 mL | Freq: Once | INTRAVENOUS | Status: AC | PRN
  Administered 2020-09-08: 10 mL via INTRAVENOUS

## 2020-09-08 NOTE — Telephone Encounter (Signed)
  Radiation Oncology         [phone] ________________________________  Name: Vincent Mcclure MRN: [id number]  Date of Service: 09/08/2020  DOB: 1932-05-02  Post Treatment Telephone Note  Diagnosis:  Stage IA2, cT1bN0M0, NSCLC, squamous cell carcinoma of the RLL.  Interval Since Last Radiation: 5 weeks   07/30/20-08/08/20 SBRT Treatment: The RLL Target was treated to 60 Gy in 5 fractions.   Narrative:  The patient was contacted today for routine follow-up. During treatment he did very well with radiotherapy and did not have significant desquamation.    Impression/Plan: 1. Stage IA2, cT1bN0M0, NSCLC, squamous cell carcinoma of the RLL. I was unable to reach the patient today but left a voicemail and on the message I discussed that we would be happy to continue to follow him as needed, but he will also continue to follow up with Dr. [name] in medical oncology and a CT scan is anticipated in January 2022.  2.         Stage IIIB, pT4aN1M0 adenocarcinoma of the right colon. The patient will follow up with Dr. [name] for routine surveillance.    Carola Rhine, PAC

## 2020-09-22 NOTE — Progress Notes (Signed)
  Radiation Oncology         [phone] ________________________________  Name: Vincent Mcclure MRN: [id number]  Date: 08/08/2020  DOB: 02/24/1932  End of Treatment Note  Diagnosis:   stage I non-small cell lung cancer of the right lower lobe   Indication for treatment::  curative       Radiation treatment dates:   07/30/20 - 08/08/20  Site/dose:   The patient was treated to the right lung with a course of stereotactic body radiation treatment.  The patient received 60 Gray in 5 fractions using a IMRT technique, with 3 fields.  Narrative: The patient tolerated radiation treatment relatively well.   No unexpected difficulties.  The patient's breathing did not significantly change during the course of the treatment.  Plan: The patient has completed radiation treatment. The patient will return to radiation oncology clinic for routine followup in one month. I advised the patient to call or return sooner if they have any questions or concerns related to their recovery or treatment. ________________________________  Jodelle Gross, M.D., Ph.D.

## 2020-10-25 ENCOUNTER — Other Ambulatory Visit: Payer: Self-pay | Admitting: Cardiology

## 2020-10-25 DIAGNOSIS — E782 Mixed hyperlipidemia: Secondary | ICD-10-CM

## 2020-10-25 DIAGNOSIS — I25810 Atherosclerosis of coronary artery bypass graft(s) without angina pectoris: Secondary | ICD-10-CM

## 2020-10-30 ENCOUNTER — Telehealth: Payer: Self-pay | Admitting: Hematology

## 2020-10-30 NOTE — Telephone Encounter (Signed)
Called pt per 1/20 sch msg - no answer . Left message for pt to call back if reschedule was still needed

## 2020-10-31 ENCOUNTER — Telehealth: Payer: Self-pay | Admitting: Hematology

## 2020-10-31 NOTE — Telephone Encounter (Signed)
Rescheduled lab and follow-up appointment per 1/21 schedule message. Patient is aware of changes.

## 2020-11-03 ENCOUNTER — Encounter (INDEPENDENT_AMBULATORY_CARE_PROVIDER_SITE_OTHER): Payer: Self-pay

## 2020-11-03 ENCOUNTER — Ambulatory Visit: Payer: Medicare HMO | Admitting: Cardiology

## 2020-11-03 ENCOUNTER — Ambulatory Visit (INDEPENDENT_AMBULATORY_CARE_PROVIDER_SITE_OTHER): Payer: Medicare HMO

## 2020-11-03 ENCOUNTER — Other Ambulatory Visit: Payer: Self-pay

## 2020-11-03 ENCOUNTER — Encounter: Payer: Self-pay | Admitting: Cardiology

## 2020-11-03 ENCOUNTER — Encounter: Payer: Self-pay | Admitting: *Deleted

## 2020-11-03 VITALS — BP 140/80 | HR 50 | Ht 71.0 in | Wt 225.0 lb

## 2020-11-03 DIAGNOSIS — R001 Bradycardia, unspecified: Secondary | ICD-10-CM

## 2020-11-03 DIAGNOSIS — R55 Syncope and collapse: Secondary | ICD-10-CM

## 2020-11-03 DIAGNOSIS — Z951 Presence of aortocoronary bypass graft: Secondary | ICD-10-CM | POA: Diagnosis not present

## 2020-11-03 DIAGNOSIS — I214 Non-ST elevation (NSTEMI) myocardial infarction: Secondary | ICD-10-CM

## 2020-11-03 DIAGNOSIS — I10 Essential (primary) hypertension: Secondary | ICD-10-CM

## 2020-11-03 DIAGNOSIS — E782 Mixed hyperlipidemia: Secondary | ICD-10-CM | POA: Diagnosis not present

## 2020-11-03 DIAGNOSIS — I2510 Atherosclerotic heart disease of native coronary artery without angina pectoris: Secondary | ICD-10-CM

## 2020-11-03 MED ORDER — METOPROLOL SUCCINATE ER 25 MG PO TB24: 25.0000 mg | ORAL_TABLET | Freq: Every day | ORAL | 0 refills | Status: DC

## 2020-11-03 MED ORDER — ASPIRIN EC 81 MG PO TBEC: 81.0000 mg | DELAYED_RELEASE_TABLET | Freq: Every day | ORAL | 3 refills | Status: DC

## 2020-11-03 NOTE — Patient Instructions (Signed)
Medication Instructions:   START TAKING ASPIRIN 81 MG BY MOUTH DAILY  DECREASE YOUR TOPROL XL TO 25 MG BY MOUTH DAILY  *If you need a refill on your cardiac medications before your next appointment, please call your pharmacy*   Testing/Procedures:  ZIO XT- Long Term Monitor Instructions   Your physician has requested you wear your ZIO patch monitor_7______days.   This is a single patch monitor.  [employer] supplies one patch monitor per enrollment.  Additional stickers are not available.   Please do not apply patch if you will be having a Nuclear Stress Test, Echocardiogram, Cardiac CT, MRI, or Chest Xray during the time frame you would be wearing the monitor. The patch cannot be worn during these tests.  You cannot remove and re-apply the ZIO XT patch monitor.   Your ZIO patch monitor will be sent USPS Priority mail from [facility] directly to your home address. The monitor may also be mailed to a PO BOX if home delivery is not available.   It may take 3-5 days to receive your monitor after you have been enrolled.   Once you have received you monitor, please review enclosed instructions.  Your monitor has already been registered assigning a specific monitor serial # to you.   Applying the monitor   Shave hair from upper left chest.   Hold abrader disc by orange tab.  Rub abrader in 40 strokes over left upper chest as indicated in your monitor instructions.   Clean area with 4 enclosed alcohol pads .  Use all pads to assure are is cleaned thoroughly.  Let dry.   Apply patch as indicated in monitor instructions.  Patch will be place under collarbone on left side of chest with arrow pointing upward.   Rub patch adhesive wings for 2 minutes.Remove white label marked "1".  Remove white label marked "2".  Rub patch adhesive wings for 2 additional minutes.   While looking in a mirror, press and release button in center of patch.  A small green light will flash 3-4 times .  This  will be your only indicator the monitor has been turned on.     Do not shower for the first 24 hours.  You may shower after the first 24 hours.   Press button if you feel a symptom. You will hear a small click.  Record Date, Time and Symptom in the Patient Log Book.   When you are ready to remove patch, follow instructions on last 2 pages of Patient Log Book.  Stick patch monitor onto last page of Patient Log Book.   Place Patient Log Book in [place] box.  Use locking tab on box and tape box closed securely.  The [employer] and [employer] has [employer] on it.  Please place in mailbox as soon as possible.  Your physician should have your test results approximately 7 days after the monitor has been mailed back to [facility].   Call [facility] at [phone] if you have questions regarding your ZIO XT patch monitor.  Call them immediately if you see an orange light blinking on your monitor.   If your monitor falls off in less than 4 days contact our Monitor department at [phone].  If your monitor becomes loose or falls off after 4 days call [employer] at [phone] for suggestions on securing your monitor.     Follow-Up:  3 MONTHS IN THE OFFICE WITH AN EXTENDER

## 2020-11-03 NOTE — Progress Notes (Signed)
11/03/2020 Vincent Mcclure   1932-08-22  [id number]  Primary Physician Patient, No Pcp Per Primary Cardiologist: Dr. Meda Coffee  Electrophysiologist: None   Reason for Visit/CC: Chest Pain  HPI:  Vincent Mcclure is a 87 y.o. male with history of CAD, hypertension, hyperlipidemia, gastric ulcers with big GI bleed in 2016 secondary to alcohol and previous tobacco abuse.  He also has history of colon cancer s/p hemicolectomy in 2019, followed by Dr. [name], last chemo in April 2020. In 06/2020 PET scan showed Non-small cell lung cancer, right ([place]) treated by radiation therapy in 07/2020. He was admitted with a non-STEMI in 2018 and found to have multivessel CAD and preserved LV function on cardiac cath. He underwent CABG x3 on Feb 15, 2017.  He had postoperative atrial fibrillation that was treated with amiodarone and he converted to normal sinus rhythm.  CHA2DS2 VASc score =4 but HAS-BLED score was 5 so no anticoagulation at that time per Dr. Sallyanne Kuster.  Bleed risk exceeds potential stroke reduction benefit. He underwent nuclear stress testing for chest pain in 07/2020 that was negative for ischemia.  Today, he is very tearful, he lost his son last Thursday. He otherwise denies chest pain or worsening DOE. No LE edema, orthopnea and no palpitations. However 2 months ago he developed an episode of syncope while sitting. There were no other associated symptoms. He has been taking Imdur 15 mg only every other day and he stopped taking aspirin after he read an article on Internet.   Cardiac Studies   2D echocardiogram 06/03/2018 Study Conclusions  - Left ventricle: The cavity size was normal. Wall thickness was   increased in a pattern of mild LVH. Systolic function was normal.   The estimated ejection fraction was in the range of 55% to 60%.   Wall motion was normal; there were no regional wall motion   abnormalities. Doppler parameters are consistent with abnormal   left ventricular relaxation (grade 1  diastolic dysfunction). - Aortic valve: Mildly calcified annulus. Trileaflet; mildly   thickened leaflets. Valve area (VTI): 4.6 cm^2. Valve area   (Vmax): 4.22 cm^2. - Right atrium: There is a 1.2 by 1.6 cm echodense mass in the   right atrium, overall poorly visualized but best seen in the   apical 4 chamber views. The subcostal views are limited . When   reviewing the 02/15/17 TEE this area is also poorly visualized, but   does appear to be due to a prominent eustachian valve, a benign   finding. - Pulmonary arteries: Systolic pressure was mildly increased. PA   peak pressure: 37 mm Hg (S). - Systemic veins: IVC is small suggesting low RA pressure and   hypovolemia.  Nuclear stress test: 06/2020   The left ventricular ejection fraction is normal (55-65%).  Nuclear stress EF: 58%. No wall motion abnormalities  There was no ST segment deviation noted during stress.  The study is normal.  This is a low risk study. No evidence of ischemia or infarction. Post CABG.   Current Meds  Medication Sig  . acetaminophen (TYLENOL) 500 MG tablet Take 1 tablet (500 mg total) by mouth every 6 (six) hours as needed for mild pain.  Marland Kitchen aspirin EC 81 MG tablet Take 1 tablet (81 mg total) by mouth daily. Swallow whole.  . bacitracin 500 UNIT/GM ointment Apply 1 application topically daily as needed for wound care.  . Cholecalciferol (VITAMIN D-3 PO) Take 2 capsules by mouth daily.   . ferrous sulfate 325 (  65 FE) MG EC tablet Take 325 mg by mouth daily.  . hydrochlorothiazide (HYDRODIURIL) 25 MG tablet Take 1 tablet (25 mg total) by mouth daily.  . isosorbide mononitrate (IMDUR) 30 MG 24 hr tablet Take 15 mg by mouth every other day.  . metoprolol succinate (TOPROL XL) 25 MG 24 hr tablet Take 1 tablet (25 mg total) by mouth daily.  . Multiple Vitamins-Minerals (PRESERVISION AREDS 2 PO) Take 1 tablet by mouth daily.  . Naphazoline HCl (CLEAR EYES OP) Place 1 drop into both eyes daily as needed  (burning eyes).  . nitroGLYCERIN (NITROSTAT) 0.4 MG SL tablet Place 1 tablet (0.4 mg total) under the tongue every 5 (five) minutes as needed for chest pain (or tightness).  . potassium chloride (KLOR-CON) 10 MEQ tablet Take 1 tablet (10 mEq total) by mouth daily.  . pravastatin (PRAVACHOL) 40 MG tablet TAKE 1 TABLET BY MOUTH AT BEDTIME  . [DISCONTINUED] metoprolol succinate (TOPROL-XL) 50 MG 24 hr tablet Take 1 tablet (50 mg total) by mouth daily. Take with or immediately following a meal.   No Known Allergies Past Medical History:  Diagnosis Date  . Alcohol abuse   . Anemia   . Arthritis    hands  . Blood transfusion without reported diagnosis   . Chronic low back pain 10/14/2011  . colon ca dx'd 05/18/18   colon cancer  . Colon polyps    hyperplastic (2004, 2010) and adenomatous (1990).    . COLONIC POLYPS, HX OF 03/05/2008  . Esophageal stricture    hx of  . Gastric ulcer   . GERD 03/05/2008  . GERD (gastroesophageal reflux disease) 1994   associated peptic strictures  . HYPERLIPIDEMIA 03/05/2008  . HYPERTENSION 03/05/2008  . Hypertension   . IBS 03/05/2008  . Impaired glucose tolerance 10/12/2011  . Iron deficiency anemia   . LEG CRAMPS 09/02/2010  . Lung cancer ([place]) dx'd 06/2020  . Myocardial infarction [facility])    May 2018  . PAD (peripheral artery disease) ([place]) 10/14/2011  . PAD (peripheral artery disease) ([place]) 2013  . PERIPHERAL EDEMA 09/02/2010  . PERIPHERAL NEUROPATHY 03/05/2008  . Prostatitis    hx of  . VARICOSE VEINS, LOWER EXTREMITIES 06/09/2009   Family History  Problem Relation Age of Onset  . Cancer Mother        Brain Cancer  . Diabetes Father   . Heart disease Father        CAD  . Hypertension Father   . Stroke Father   . Diabetes Sister   . Stomach cancer Neg Hx   . Pancreatic cancer Neg Hx   . Colon cancer Neg Hx   . Esophageal cancer Neg Hx   . Rectal cancer Neg Hx    Past Surgical History:  Procedure Laterality Date  . CATARACT EXTRACTION  bilat   . COLECTOMY    . COLONOSCOPY    . CORONARY ARTERY BYPASS GRAFT N/A 02/15/2017   Procedure: CORONARY ARTERY BYPASS GRAFTING times three  with left internal mammary harvest and endoscopic harvest of Right SVG. Grafts of LIMA to  LAD, SVG to Distal Circ, and to First Diag.;  Surgeon: Grace Isaac, MD;  Location: [facility];  Service: Open Heart Surgery;  Laterality: N/A;  . ESOPHAGOGASTRODUODENOSCOPY N/A 11/14/2014   Procedure: ESOPHAGOGASTRODUODENOSCOPY (EGD);  Surgeon: Jerene Bears, MD;  Location: [facility] ENDOSCOPY;  Service: Endoscopy;  Laterality: N/A;  . IR RADIOLOGIST EVAL & MGMT  07/04/2018  . IR RADIOLOGIST EVAL & MGMT  07/18/2018  . LAPAROTOMY N/A 05/19/2018   Procedure: EXPLORATORY LAPAROTOMY RIGHT COLECTOMY;  Surgeon: Fanny Skates, MD;  Location: [facility] ORS;  Service: General;  Laterality: N/A;  . LEFT HEART CATH AND CORONARY ANGIOGRAPHY N/A 02/14/2017   Procedure: Left Heart Cath and Coronary Angiography;  Surgeon: Belva Crome, MD;  Location: [facility];  Service: Cardiovascular;  Laterality: N/A;  . LUMBAR SPINE SURGERY  11/2008   Dr Joya Salm  . TEE WITHOUT CARDIOVERSION N/A 02/15/2017   Procedure: TRANSESOPHAGEAL ECHOCARDIOGRAM (TEE);  Surgeon: Grace Isaac, MD;  Location: [facility];  Service: Open Heart Surgery;  Laterality: N/A;  . UPPER GASTROINTESTINAL ENDOSCOPY     Social History   Socioeconomic History  . Marital status: Married    Spouse name: Not on file  . Number of children: 4  . Years of education: Not on file  . Highest education level: Not on file  Occupational History  . Occupation: Retired Engineer, maintenance  Tobacco Use  . Smoking status: Former Smoker    Packs/day: 1.25    Years: 50.00    Pack years: 62.50    Types: Cigarettes    Quit date: 11/11/2008    Years since quitting: 11.9  . Smokeless tobacco: Former Systems developer    Types: Chew  . Tobacco comment: smoked off and on for years  Vaping Use  . Vaping Use: Never used  Substance and Sexual Activity  . Alcohol  use: Yes    Alcohol/week: 6.0 standard drinks    Types: 2 Glasses of wine, 4 Cans of beer per week    Comment: vodka  . Drug use: No  . Sexual activity: Never  Other Topics Concern  . Not on file  Social History Narrative   ** Merged History Encounter **       4 sons   Social Determinants of Health   Financial Resource Strain: Not on file  Food Insecurity: Not on file  Transportation Needs: Not on file  Physical Activity: Not on file  Stress: Not on file  Social Connections: Not on file  Intimate Partner Violence: Not on file    Lipid Panel     Component Value Date/Time   CHOL 133 04/24/2019 0917   TRIG 169 (H) 04/24/2019 0917   HDL 47 04/24/2019 0917   CHOLHDL 2.8 04/24/2019 0917   CHOLHDL 3.4 02/12/2017 0657   VLDL 17 02/12/2017 0657   LDLCALC 52 04/24/2019 0917   LDLDIRECT 64 04/24/2019 0917   LDLDIRECT 133.2 10/14/2011 1106   Review of Systems: General: negative for chills, fever, night sweats or weight changes.  Cardiovascular: negative for chest pain, dyspnea on exertion, edema, orthopnea, palpitations, paroxysmal nocturnal dyspnea or shortness of breath Dermatological: negative for rash Respiratory: negative for cough or wheezing Urologic: negative for hematuria Abdominal: negative for nausea, vomiting, diarrhea, bright red blood per rectum, melena, or hematemesis Neurologic: negative for visual changes, syncope, or dizziness All other systems reviewed and are otherwise negative except as noted above.  Physical Exam:  Blood pressure 140/80, pulse (!) 50, height 5\' 11"  (1.803 m), weight 225 lb (102.1 kg), SpO2 97 %.  General appearance: alert, cooperative and no distress Neck: no carotid bruit and no JVD Lungs: clear to auscultation bilaterally Heart: regular rate and rhythm, S1, S2 normal, no murmur, click, rub or gallop Extremities: extremities normal, atraumatic, no cyanosis or edema Pulses: 2+ and symmetric Skin: Skin color, texture, turgor normal. No  rashes or lesions Neurologic: Grossly normal  EKG sinus rhythm,  normal ECG, no change. Personally reviewed.    ASSESSMENT AND PLAN:   1. CAD status post CABG 02/15/17.  Negative stress test in 07/2020, he is advised to restart taking aspirin 81 mg po daily.  2. PAF postop maintaining normal sinus rhythm, now off amiodarone.  Denies any palpitations.  3. Syncope - low HR in 50', I will decrease Toprol XL to 25 mg po daily and obtain 7 day zio patch monitor to evaluate for possible brady or tachyarrhythmias.  4. Essential hypertension - slightly elevated, I will add Imdur 15 mg daily.  5. Mixed hyperlipidemia - continue pravastatin, he is tolerating it well.  6.  Lower extremity edema -resolved with hydrochlorothiazide.  7. Colon and lung ca - followed by Dr [name], as above  Follow-up in 3 months.  Ena Dawley , MD [facility] 11/03/2020 8:52 PM

## 2020-11-03 NOTE — Progress Notes (Signed)
Patient ID: Vincent Mcclure, male   DOB: 09/10/1932, 87 y.o.   MRN: [id number] Patient enrolled for [employer] to ship a 7 day ZIO XT long term holter monitor to his home.

## 2020-11-04 ENCOUNTER — Ambulatory Visit (HOSPITAL_COMMUNITY): Payer: Medicare HMO

## 2020-11-04 ENCOUNTER — Inpatient Hospital Stay: Payer: Medicare HMO

## 2020-11-06 ENCOUNTER — Inpatient Hospital Stay: Payer: Medicare HMO | Admitting: Hematology

## 2020-11-06 ENCOUNTER — Other Ambulatory Visit: Payer: Self-pay

## 2020-11-06 ENCOUNTER — Inpatient Hospital Stay: Payer: Medicare HMO | Attending: Nurse Practitioner

## 2020-11-06 DIAGNOSIS — K219 Gastro-esophageal reflux disease without esophagitis: Secondary | ICD-10-CM | POA: Insufficient documentation

## 2020-11-06 DIAGNOSIS — Z7982 Long term (current) use of aspirin: Secondary | ICD-10-CM | POA: Diagnosis not present

## 2020-11-06 DIAGNOSIS — I4891 Unspecified atrial fibrillation: Secondary | ICD-10-CM | POA: Insufficient documentation

## 2020-11-06 DIAGNOSIS — I2510 Atherosclerotic heart disease of native coronary artery without angina pectoris: Secondary | ICD-10-CM | POA: Insufficient documentation

## 2020-11-06 DIAGNOSIS — G629 Polyneuropathy, unspecified: Secondary | ICD-10-CM | POA: Diagnosis not present

## 2020-11-06 DIAGNOSIS — Z9049 Acquired absence of other specified parts of digestive tract: Secondary | ICD-10-CM | POA: Diagnosis not present

## 2020-11-06 DIAGNOSIS — I10 Essential (primary) hypertension: Secondary | ICD-10-CM | POA: Diagnosis not present

## 2020-11-06 DIAGNOSIS — Z85038 Personal history of other malignant neoplasm of large intestine: Secondary | ICD-10-CM | POA: Insufficient documentation

## 2020-11-06 DIAGNOSIS — C3491 Malignant neoplasm of unspecified part of right bronchus or lung: Secondary | ICD-10-CM

## 2020-11-06 DIAGNOSIS — K589 Irritable bowel syndrome without diarrhea: Secondary | ICD-10-CM | POA: Diagnosis not present

## 2020-11-06 DIAGNOSIS — C3431 Malignant neoplasm of lower lobe, right bronchus or lung: Secondary | ICD-10-CM | POA: Insufficient documentation

## 2020-11-06 DIAGNOSIS — Z79899 Other long term (current) drug therapy: Secondary | ICD-10-CM | POA: Diagnosis not present

## 2020-11-06 DIAGNOSIS — Z9221 Personal history of antineoplastic chemotherapy: Secondary | ICD-10-CM | POA: Insufficient documentation

## 2020-11-06 DIAGNOSIS — E785 Hyperlipidemia, unspecified: Secondary | ICD-10-CM | POA: Insufficient documentation

## 2020-11-06 DIAGNOSIS — C182 Malignant neoplasm of ascending colon: Secondary | ICD-10-CM

## 2020-11-06 DIAGNOSIS — I252 Old myocardial infarction: Secondary | ICD-10-CM | POA: Diagnosis not present

## 2020-11-06 LAB — CBC WITH DIFFERENTIAL (CANCER CENTER ONLY)
Abs Immature Granulocytes: 0.02 10*3/uL (ref 0.00–0.07)
Basophils Absolute: 0 10*3/uL (ref 0.0–0.1)
Basophils Relative: 0 %
Eosinophils Absolute: 0.1 10*3/uL (ref 0.0–0.5)
Eosinophils Relative: 2 %
HCT: 43.6 % (ref 39.0–52.0)
Hemoglobin: 14.9 g/dL (ref 13.0–17.0)
Immature Granulocytes: 1 %
Lymphocytes Relative: 22 %
Lymphs Abs: 0.9 10*3/uL (ref 0.7–4.0)
MCH: 30.3 pg (ref 26.0–34.0)
MCHC: 34.2 g/dL (ref 30.0–36.0)
MCV: 88.6 fL (ref 80.0–100.0)
Monocytes Absolute: 0.5 10*3/uL (ref 0.1–1.0)
Monocytes Relative: 11 %
Neutro Abs: 2.7 10*3/uL (ref 1.7–7.7)
Neutrophils Relative %: 64 %
Platelet Count: 151 10*3/uL (ref 150–400)
RBC: 4.92 MIL/uL (ref 4.22–5.81)
RDW: 13.1 % (ref 11.5–15.5)
WBC Count: 4.2 10*3/uL (ref 4.0–10.5)
nRBC: 0 % (ref 0.0–0.2)

## 2020-11-06 LAB — CMP (CANCER CENTER ONLY)
ALT: 12 U/L (ref 0–44)
AST: 16 U/L (ref 15–41)
Albumin: 3.8 g/dL (ref 3.5–5.0)
Alkaline Phosphatase: 76 U/L (ref 38–126)
Anion gap: 11 (ref 5–15)
BUN: 12 mg/dL (ref 8–23)
CO2: 24 mmol/L (ref 22–32)
Calcium: 9.3 mg/dL (ref 8.9–10.3)
Chloride: 101 mmol/L (ref 98–111)
Creatinine: 1.18 mg/dL (ref 0.61–1.24)
GFR, Estimated: >60 mL/min (ref >60)
Glucose, Bld: 214 mg/dL — ABNORMAL HIGH (ref 70–99)
Potassium: 3.5 mmol/L (ref 3.5–5.1)
Sodium: 136 mmol/L (ref 135–145)
Total Bilirubin: 0.7 mg/dL (ref 0.3–1.2)
Total Protein: 6.6 g/dL (ref 6.5–8.1)

## 2020-11-06 LAB — CEA (IN HOUSE-CHCC): CEA (CHCC-In House): 2.84 ng/mL (ref 0.00–5.00)

## 2020-11-07 ENCOUNTER — Ambulatory Visit (HOSPITAL_COMMUNITY)
Admission: RE | Admit: 2020-11-07 | Discharge: 2020-11-07 | Disposition: A | Discharge status: Home / Self Care | Payer: Medicare HMO | Source: Ambulatory Visit | Attending: Hematology | Admitting: Hematology

## 2020-11-07 DIAGNOSIS — Z85118 Personal history of other malignant neoplasm of bronchus and lung: Secondary | ICD-10-CM | POA: Diagnosis not present

## 2020-11-07 DIAGNOSIS — C3431 Malignant neoplasm of lower lobe, right bronchus or lung: Secondary | ICD-10-CM

## 2020-11-07 DIAGNOSIS — J8410 Pulmonary fibrosis, unspecified: Secondary | ICD-10-CM | POA: Diagnosis not present

## 2020-11-07 DIAGNOSIS — I700 Atherosclerosis of aorta: Secondary | ICD-10-CM | POA: Diagnosis not present

## 2020-11-07 DIAGNOSIS — Z85038 Personal history of other malignant neoplasm of large intestine: Secondary | ICD-10-CM | POA: Diagnosis not present

## 2020-11-07 NOTE — Progress Notes (Signed)
[place]   [phone] [phone]   Clinic Follow up Note   Patient Care Team: System, Provider Not In as PCP - General Pyrtle, Lajuan Lines, MD as Consulting Physician (Gastroenterology) Allyn Kenner, MD as Consulting Physician (Dermatology) Dorothy Spark, MD as Consulting Physician (Cardiology) Levin Erp, [place] as Physician Assistant (Gastroenterology) Murrell Converse as Physician Assistant (Cardiology)  Date of Service:  11/10/2020  CHIEF COMPLAINT: F/u on stage III colon cancer   SUMMARY OF ONCOLOGIC HISTORY: Oncology History Overview Note  Cancer Staging Cancer of ascending colon pT4apN1 s/p right colectomy 05/19/2018 Staging form: Colon and Rectum, AJCC 8th Edition - Pathologic stage from 05/19/2018: Stage IIIB (pT4a, pN1, cM0) - Signed by Truitt Merle, MD on 05/24/2018     Cancer of ascending colon pT4apN1 s/p right colectomy 05/19/2018  05/18/2018 Imaging   05/18/2018 CT AP IMPRESSION: 1. There appears to be a nearly obstructing lesion in the region of the hepatic flexure of the colon which is highly concerning for primary colonic neoplasm. Slight haziness of the surrounding soft tissues may be indicative of early local invasion. This appears partially obstructive as evidenced by dilatation of more proximal aspects of the small bowel and colon. 2. Left adrenal lesion is stable compared to prior studies, previously characterized as an adenoma. 3. Aortic atherosclerosis, in addition to left main and 3 vessel coronary artery disease. Status post median sternotomy for CABG. 4. Additional incidental findings, as above.  Aortic Atherosclerosis (ICD10-I70.0).   05/18/2018 - 05/30/2018 Hospital Admission   Admit date: 05/18/2018 Admission diagnosis: Colon Cancer Discharged on: 05/30/2018   05/19/2018 Cancer Staging   Staging form: Colon and Rectum, AJCC 8th Edition - Pathologic stage from 05/19/2018: Stage IIIB (pT4a, pN1, cM0) - Signed  by Truitt Merle, MD on 05/24/2018    05/19/2018 Pathology Results   05/19/2018 Surgical Pathology Diagnosis Colon, segmental resection for tumor, right - INVASIVE COLORECTAL ADENOCARCINOMA, 5 CM. - CARCINOMA FOCALLY LESS THAN 0.1 CM FROM SEROSAL SURFACE. - MARGINS NOT INVOLVED. - METASTATIC CARCINOMA IN ONE OF EIGHT LYMPH NODES (1/9). - BENIGN APPENDIX.   05/19/2018 Surgery   EXPLORATORY LAPAROTOMY with right colectomy with Fanny Skates, MD   05/24/2018 Initial Diagnosis   Cancer of right colon ([place])   06/12/2018 Imaging    06/12/2018 CT CAP IMPRESSION: 1. Evidence all bowel perforation/anastomosed breakdown with large volume intraperitoneal free air fluid in the RIGHT upper quadrant adjacent and above the liver. 2. Large loculated fluid collection in the RIGHT anterior pelvis extending along the LEFT pericolic gutter consists with PERITONEAL ABSCESS. 3. Of note, no large volume intraperitoneal free fluid. Oral contrast passed through the RIGHT colon anastomosis without evidence of leak. Findings could indicate a prior anastomotic breakdown which has healed in the interval with subsequent abscess formation from the prior leak / breakdown.   06/17/2018 Procedure   06/17/2018 US Thoracentesis IMPRESSION: Successful ultrasound guided right thoracentesis yielding 1 L of pleural fluid. No pneumothorax on post-procedure chest x-ray.  No malignancy on pathology   06/20/2018 Imaging   06/20/2018 CT AP IMPRESSION: 1. Decompression of right upper quadrant air and fluid collection after percutaneous catheter drainage. There remains a tiny rim of fluid as well as small extraluminal gas bubbles in this region suspicious for continued leak from bowel. 2. Further decrease in size of residual abscess fluid collection in the pelvis after percutaneous catheter drainage. There remains some fluid around the drainage catheter in the right pelvis.   07/04/2018 Imaging  07/04/2018 CT  AP IMPRESSION: 1. Further decompression of right perihepatic abscess with resolution of extraluminal air. 2. Relatively stable volume of fluid surrounding the right pelvic drainage catheter.   07/05/2018 Imaging   07/05/2018 CT AP IMPRESSION: 1. No change compared to the CT scan performed 9 hours earlier. 2. Anterior right perihepatic space collection is decompressed by indwelling percutaneous drain, with no residual measurable collection in this location. 3. Thick walled right lower quadrant collection with indwelling percutaneous drain is stable. No new focal fluid collections. 4. Stable postsurgical changes from right hemicolectomy with no evidence of bowel obstruction. Stable reactive wall thickening in the sigmoid colon adjacent to the right lower quadrant collection. 5. Stable small to moderate dependent right pleural effusion. 6. Stable left adrenal adenoma. 7.  Aortic Atherosclerosis (ICD10-I70.0).   07/31/2018 - 01/21/2019 Chemotherapy   Adjuvant Xeloda 2000mg  bid, 7 days on, 7 days off.  Reduced to 2000mg  in the AM and 1500mg  in the PM due to diarrhea. Completed on 01/21/19    04/12/2019 Imaging   CT CAP W contrast 04/12/19  IMPRESSION: 1. Status post right hemicolectomy. 2. No definite metastatic disease in the chest, abdomen, or pelvis. 7 mm right lower lobe pulmonary nodule, obscured by atelectasis on previous chest CT and not included on multiple prior abdomen / pelvis CTs. Close follow-up recommended to ensure stability. 3.  Aortic Atherosclerois (ICD10-170.0)   09/04/2019 Imaging   CT AP W Contrast I MPRESSION: 1. No evidence of local colon cancer recurrence at the RIGHT hemicolectomy site. 2. No evidence of metastatic disease in the abdomen pelvis. 3. Mild nodularity at the LEFT lung base is favored benign. Recommend attention on routine surveillance 4. Aortic Atherosclerosis (ICD10-I70.0).   07/02/2020 Relapse/Recurrence   FINAL MICROSCOPIC DIAGNOSIS:   A.  LUNG, RIGHT LOWER LOBE, NEEDLE CORE BIOPSY:  - Non-small cell carcinoma, see comment    COMMENT:   Immunohistochemical stains for characterization of the tumor phenotype  are pending and will be reported in an addendum.  Dr. Tresa Moore reviewed the  case and concurs with the diagnosis. Dr. [name] was notified on 07/03/2020.    Malignant neoplasm of bronchus of right lower lobe ([place])  05/30/2020 Imaging   CT CAP w contrast IMPRESSION: 1. Significant enlargement of a right lower lobe pulmonary nodule. Suspicious for primary bronchogenic carcinoma. Isolated pulmonary metastasis felt less likely. 2. Otherwise, no evidence of metastatic disease from patient's colon cancer. 3. Aortic atherosclerosis (ICD10-I70.0) and emphysema (ICD10-J43.9). 4. Left adrenal adenoma.   These results will be called to the ordering clinician or representative by the Radiologist Assistant, and communication documented in the PACS or [employer].   06/11/2020 PET scan   IMPRESSION: 1. The index nodule within the superior segment of the right lower lobe is FDG avid and worrisome for either primary bronchogenic neoplasm versus metastatic disease 2. Within the anteromedial aspect of the left maxillary sinus, in the region of the nasolacrimal duct, there is a focal soft tissue attenuating filling defect which exhibits intense FDG uptake within SUV max of 55.8. The although focal uptake within the paranasal sinuses may be seen with sinusitis primary neoplasm of the sinuses or nasal lacrimal duct cannot be excluded. In light of the intense FDG uptake in this area further investigation with contrast enhanced CT or MRI of the soft tissues of neck to include the maxillary sinuses is recommended. 3.  Aortic Atherosclerosis (ICD10-I70.0). 4. Coronary artery calcifications 5. Prior granulomatous disease.     07/02/2020 Cancer Staging  Staging form: Lung, AJCC 8th Edition - Clinical stage from 07/02/2020: Stage IA2  (cT1b, cN0, cM0) - Signed by Truitt Merle, MD on 07/06/2020   07/06/2020 Initial Diagnosis   Non-small cell lung cancer, right ([place])   07/30/2020 - 08/08/2020 Radiation Therapy   SBRT to lung 07/16/20-08/04/20 with Dr Lisbeth Renshaw.    11/07/2020 Imaging   CT Chest  IMPRESSION: 1. Decreased size of dominant area of nodularity in the RIGHT chest with surrounding post treatment changes. 2. New area of nodularity along the pleural surface along the posterosuperior segment of the RIGHT lower lobe is suspicious but indeterminate at this time. Could consider short interval follow-up or PET imaging for further assessment as this area would be challenging for tissue sampling. There is also the possibility this area could somehow related to post treatment changes as well, though the morphologic features do raise suspicion. 3. Stable LEFT adrenal adenoma. 4. Changes of CABG. 5. Aortic atherosclerosis.   Aortic Atherosclerosis (ICD10-I70.0).      CURRENT THERAPY:  Surveillance  INTERVAL HISTORY:  Vincent Mcclure is here for a follow up of colon cancer. He presents to the clinic with his wife. He notes he fell 2 days ago from recent ice and hurt his buttocks. He notes he was seen by ENT Dr Tonette Lederer for edema in his nasal cavity which was removed with light yellow discharge. He was told he has something around his left tear duct which another specialist Dr. Lorina Rabon tried to evaluate with MRI, but did not follow up with him yet. He notes Dr Meda Coffee put in heart monitor for 7 days which he currently has. He denies pain, but will have occasional right lower back pain which he attributes to his bowel.  He notes his loss his son due to heart complications in 01/980, 13 days ago.     REVIEW OF SYSTEMS:   Constitutional: Denies fevers, chills or abnormal weight loss Eyes: Denies blurriness of vision Ears, nose, mouth, throat, and face: Denies mucositis or sore throat Respiratory: Denies cough, dyspnea or  wheezes Cardiovascular: Denies palpitation, chest discomfort or lower extremity swelling Gastrointestinal:  Denies nausea, heartburn or change in bowel habits Skin: Denies abnormal skin rashes Lymphatics: Denies new lymphadenopathy or easy bruising Neurological:Denies numbness, tingling or new weaknesses Behavioral/Psych: Mood is stable, no new changes  All other systems were reviewed with the patient and are negative.  MEDICAL HISTORY:  Past Medical History:  Diagnosis Date  . Alcohol abuse   . Anemia   . Arthritis    hands  . Blood transfusion without reported diagnosis   . Chronic low back pain 10/14/2011  . colon ca dx'd 05/18/18   colon cancer  . Colon polyps    hyperplastic (2004, 2010) and adenomatous (1990).    . COLONIC POLYPS, HX OF 03/05/2008  . Esophageal stricture    hx of  . Gastric ulcer   . GERD 03/05/2008  . GERD (gastroesophageal reflux disease) 1994   associated peptic strictures  . HYPERLIPIDEMIA 03/05/2008  . HYPERTENSION 03/05/2008  . Hypertension   . IBS 03/05/2008  . Impaired glucose tolerance 10/12/2011  . Iron deficiency anemia   . LEG CRAMPS 09/02/2010  . Lung cancer ([place]) dx'd 06/2020  . Myocardial infarction [facility])    May 2018  . PAD (peripheral artery disease) ([place]) 10/14/2011  . PAD (peripheral artery disease) ([place]) 2013  . PERIPHERAL EDEMA 09/02/2010  . PERIPHERAL NEUROPATHY 03/05/2008  . Prostatitis    hx of  . VARICOSE VEINS,  LOWER EXTREMITIES 06/09/2009    SURGICAL HISTORY: Past Surgical History:  Procedure Laterality Date  . CATARACT EXTRACTION  bilat  . COLECTOMY    . COLONOSCOPY    . CORONARY ARTERY BYPASS GRAFT N/A 02/15/2017   Procedure: CORONARY ARTERY BYPASS GRAFTING times three  with left internal mammary harvest and endoscopic harvest of Right SVG. Grafts of LIMA to  LAD, SVG to Distal Circ, and to First Diag.;  Surgeon: Grace Isaac, MD;  Location: [place];  Service: Open Heart Surgery;  Laterality: N/A;  .  ESOPHAGOGASTRODUODENOSCOPY N/A 11/14/2014   Procedure: ESOPHAGOGASTRODUODENOSCOPY (EGD);  Surgeon: Jerene Bears, MD;  Location: [facility] ENDOSCOPY;  Service: Endoscopy;  Laterality: N/A;  . IR RADIOLOGIST EVAL & MGMT  07/04/2018  . IR RADIOLOGIST EVAL & MGMT  07/18/2018  . LAPAROTOMY N/A 05/19/2018   Procedure: EXPLORATORY LAPAROTOMY RIGHT COLECTOMY;  Surgeon: Fanny Skates, MD;  Location: [facility];  Service: General;  Laterality: N/A;  . LEFT HEART CATH AND CORONARY ANGIOGRAPHY N/A 02/14/2017   Procedure: Left Heart Cath and Coronary Angiography;  Surgeon: Belva Crome, MD;  Location: [place] CV LAB;  Service: Cardiovascular;  Laterality: N/A;  . LUMBAR SPINE SURGERY  11/2008   Dr Joya Salm  . TEE WITHOUT CARDIOVERSION N/A 02/15/2017   Procedure: TRANSESOPHAGEAL ECHOCARDIOGRAM (TEE);  Surgeon: Grace Isaac, MD;  Location: [place];  Service: Open Heart Surgery;  Laterality: N/A;  . UPPER GASTROINTESTINAL ENDOSCOPY      I have reviewed the social history and family history with the patient and they are unchanged from previous note.  ALLERGIES:  has No Known Allergies.  MEDICATIONS:  Current Outpatient Medications  Medication Sig Dispense Refill  . acetaminophen (TYLENOL) 500 MG tablet Take 1 tablet (500 mg total) by mouth every 6 (six) hours as needed for mild pain.  0  . aspirin EC 81 MG tablet Take 1 tablet (81 mg total) by mouth daily. Swallow whole. 90 tablet 3  . bacitracin 500 UNIT/GM ointment Apply 1 application topically daily as needed for wound care.    . Cholecalciferol (VITAMIN D-3 PO) Take 2 capsules by mouth daily.     . ferrous sulfate 325 (65 FE) MG EC tablet Take 325 mg by mouth daily.    . hydrochlorothiazide (HYDRODIURIL) 25 MG tablet Take 1 tablet (25 mg total) by mouth daily. 90 tablet 2  . isosorbide mononitrate (IMDUR) 30 MG 24 hr tablet Take 15 mg by mouth every other day.    . metoprolol succinate (TOPROL XL) 25 MG 24 hr tablet Take 1 tablet (25 mg total) by mouth daily. 90  tablet 0  . Multiple Vitamins-Minerals (PRESERVISION AREDS 2 PO) Take 1 tablet by mouth daily.    . Naphazoline HCl (CLEAR EYES OP) Place 1 drop into both eyes daily as needed (burning eyes).    . nitroGLYCERIN (NITROSTAT) 0.4 MG SL tablet Place 1 tablet (0.4 mg total) under the tongue every 5 (five) minutes as needed for chest pain (or tightness). 25 tablet 3  . potassium chloride (KLOR-CON) 10 MEQ tablet Take 1 tablet (10 mEq total) by mouth daily. 7 tablet 1  . pravastatin (PRAVACHOL) 40 MG tablet TAKE 1 TABLET BY MOUTH AT BEDTIME 90 tablet 2   No current facility-administered medications for this visit.    PHYSICAL EXAMINATION: ECOG PERFORMANCE STATUS: 1 - Symptomatic but completely ambulatory  Vitals:   11/10/20 1058  BP: (!) 138/59  Pulse: 87  Resp: 16  Temp: 97.7 F (36.5 C)  SpO2: 99%   Filed Weights   11/10/20 1058  Weight: 234 lb 9.6 oz (106.4 kg)    GENERAL:alert, no distress and comfortable SKIN: skin color, texture, turgor are normal, no rashes or significant lesions EYES: normal, Conjunctiva are pink and non-injected, sclera clear  NECK: supple, thyroid normal size, non-tender, without nodularity LYMPH:  no palpable lymphadenopathy in the cervical, axillary  LUNGS: clear to auscultation and percussion with normal breathing effort HEART: regular rate & rhythm and no murmurs and no lower extremity edema ABDOMEN:abdomen soft, non-tender and normal bowel sounds Musculoskeletal:no cyanosis of digits and no clubbing  NEURO: alert & oriented x 3 with fluent speech, no focal motor/sensory deficits  LABORATORY DATA:  I have reviewed the data as listed CBC Latest Ref Rng & Units 11/06/2020 08/04/2020 07/02/2020  WBC 4.0 - 10.5 K/uL 4.2 4.2 5.4  Hemoglobin 13.0 - 17.0 g/dL 14.9 14.3 16.2  Hematocrit 39.0 - 52.0 % 43.6 41.4 48.9  Platelets 150 - 400 K/uL 151 172 191     CMP Latest Ref Rng & Units 11/06/2020 08/04/2020 05/28/2020  Glucose 70 - 99 mg/dL 214(H) 88 145(H)   BUN 8 - 23 mg/dL 12 18 13   Creatinine 0.61 - 1.24 mg/dL 1.18 1.14 1.14  Sodium 135 - 145 mmol/L 136 135 136  Potassium 3.5 - 5.1 mmol/L 3.5 3.3(L) 3.6  Chloride 98 - 111 mmol/L 101 97(L) 98  CO2 22 - 32 mmol/L 24 32 29  Calcium 8.9 - 10.3 mg/dL 9.3 9.5 10.0  Total Protein 6.5 - 8.1 g/dL 6.6 6.5 6.5  Total Bilirubin 0.3 - 1.2 mg/dL 0.7 0.5 0.9  Alkaline Phos 38 - 126 U/L 76 75 93  AST 15 - 41 U/L 16 17 16   ALT 0 - 44 U/L 12 15 11       RADIOGRAPHIC STUDIES: I have personally reviewed the radiological images as listed and agreed with the findings in the report. No results found.   ASSESSMENT & PLAN:  CANYON LOHR is a 87 y.o. male with   1.Non-small cell lung cancer in right lower lobe, squamous cell carcinoma, cT1bN0M0 stage IA -This was discovered on his 05/30/20 surveillance CT scan for colon cancer, the lesion was hypermetabolic on 11/19/45 PET, no other distant metastasis. -His 07/02/20 fine-needle biopsy of the lung mass showed squamous cell carcinoma.He had a history of heavy smoking. -He was treated with SBRT to lung 07/30/20-08/08/20 with Dr Lisbeth Renshaw. He is now on surveillance.  -We discussed his CT Chest from 11/07/20 which shows Decreased size of dominant area of nodularity in the RIGHT chest and New area of nodularity along the pleural surface along the posterosuperior segment of the RIGHT lower lobe is suspicious but indeterminate at this time, could also be related to his recent radiation. I personally reviewed images with patient today. I personally do not feel PET scan will give [place] more information, Will repeat scan in 3 months for close follow up. He is agreeable.  -He is otherwise doing well. His breathing is stable. Labs reviewed, CBC and CMP WNL except BG 214. -F/u in 3 months with CT CAP at that time.   2. Left maxillary sinus mass -He was found to have a hypermetabolic left maxillary sinus lesion. His CT Maxilla on 08/05/20 showed 66mm mass in the left nasal  lacrimal duct. It has slightly enlarged since 2016 -He was seen by Dr Lucia Gaskins who removed what he could in late 2021. There was remaining mass in left maxillary sinus. He was  seen by Specialist Dr Lorina Rabon who ordered MRI. His 09/08/20 MRI showed mass is still present in left lacrimal duct mass with obstruction. I reviewed MRI with patient today  -He will present his case in ENT tumor conference next week   3. Adenocarcinoma of right colon, invasive adenocarcinoma, stage IIIB (pT4a, pN1, cM0), MSS -Diagnosed in 05/2018. Treated with hemicolectomy. HereceivedadjuvantXeloda 2000 mg BID one week on, one week offfrom10/21/2019 to 01/29/2019. -Heis clinically doing well, no evidence of recurrence so far -We will continue monitoring. -His next CT CAP in 01/2021  4. CAD S/P CABG, Afib  -f/u withDr Opalski and Dr Meda Coffee.Continue on medications -He had episode of Afib in 07/2020.  -He currently has heart monitor for the week (11/10/20). He will continue to f/u with Dr Meda Coffee.   5. 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  -ENT conference discussion next week    No problem-specific Assessment & Plan notes found for this encounter.   Orders Placed This Encounter  Procedures  . CT CHEST ABDOMEN PELVIS W CONTRAST    Standing Status:   Future    Standing Expiration Date:   11/10/2021    Order Specific Question:   If indicated for the ordered procedure, I authorize the administration of contrast media per Radiology protocol    Answer:   Yes    Order Specific Question:   Preferred imaging location?    Answer:   [facility]    Order Specific Question:   Release to patient    Answer:   Immediate    Order Specific Question:   Is Oral Contrast requested for this exam?    Answer:   Yes, Per  Radiology protocol    Order Specific Question:   Reason for Exam (SYMPTOM  OR DIAGNOSIS REQUIRED)    Answer:   F/U RIGHT LUNG NODULES   All questions were answered. The patient knows to call the clinic with any problems, questions or concerns. No barriers to learning was detected. The total time spent in the appointment was 30 minutes.     Truitt Merle, MD 11/10/2020   I, Joslyn Devon, am acting as scribe for Truitt Merle, MD.   I have reviewed the above documentation for accuracy and completeness, and I agree with the above.

## 2020-11-10 ENCOUNTER — Inpatient Hospital Stay: Payer: Medicare HMO | Admitting: Hematology

## 2020-11-10 ENCOUNTER — Other Ambulatory Visit: Payer: Self-pay

## 2020-11-10 VITALS — BP 138/59 | HR 87 | Temp 97.7°F | Resp 16 | Ht 71.0 in | Wt 234.6 lb

## 2020-11-10 DIAGNOSIS — C3431 Malignant neoplasm of lower lobe, right bronchus or lung: Secondary | ICD-10-CM | POA: Diagnosis not present

## 2020-11-10 DIAGNOSIS — K219 Gastro-esophageal reflux disease without esophagitis: Secondary | ICD-10-CM | POA: Diagnosis not present

## 2020-11-10 DIAGNOSIS — Z85038 Personal history of other malignant neoplasm of large intestine: Secondary | ICD-10-CM | POA: Diagnosis not present

## 2020-11-10 DIAGNOSIS — I252 Old myocardial infarction: Secondary | ICD-10-CM | POA: Diagnosis not present

## 2020-11-10 DIAGNOSIS — C182 Malignant neoplasm of ascending colon: Secondary | ICD-10-CM

## 2020-11-10 DIAGNOSIS — I4891 Unspecified atrial fibrillation: Secondary | ICD-10-CM | POA: Diagnosis not present

## 2020-11-10 DIAGNOSIS — G629 Polyneuropathy, unspecified: Secondary | ICD-10-CM | POA: Diagnosis not present

## 2020-11-10 DIAGNOSIS — I2510 Atherosclerotic heart disease of native coronary artery without angina pectoris: Secondary | ICD-10-CM | POA: Diagnosis not present

## 2020-11-10 DIAGNOSIS — E785 Hyperlipidemia, unspecified: Secondary | ICD-10-CM | POA: Diagnosis not present

## 2020-11-10 DIAGNOSIS — I10 Essential (primary) hypertension: Secondary | ICD-10-CM | POA: Diagnosis not present

## 2020-11-11 ENCOUNTER — Encounter: Payer: Self-pay | Admitting: Hematology

## 2020-11-18 DIAGNOSIS — R001 Bradycardia, unspecified: Secondary | ICD-10-CM | POA: Diagnosis not present

## 2020-11-18 DIAGNOSIS — R55 Syncope and collapse: Secondary | ICD-10-CM | POA: Diagnosis not present

## 2020-11-19 ENCOUNTER — Telehealth: Payer: Self-pay | Admitting: *Deleted

## 2020-11-19 DIAGNOSIS — R001 Bradycardia, unspecified: Secondary | ICD-10-CM

## 2020-11-19 DIAGNOSIS — I4891 Unspecified atrial fibrillation: Secondary | ICD-10-CM

## 2020-11-19 DIAGNOSIS — R9431 Abnormal electrocardiogram [ECG] [EKG]: Secondary | ICD-10-CM

## 2020-11-19 DIAGNOSIS — I495 Sick sinus syndrome: Secondary | ICD-10-CM

## 2020-11-19 MED ORDER — APIXABAN 5 MG PO TABS: 5.0000 mg | ORAL_TABLET | Freq: Two times a day (BID) | ORAL | 11 refills | Status: DC

## 2020-11-19 NOTE — Telephone Encounter (Signed)
-----   Message from Nuala Alpha, LPN sent at 05/11/3886  8:27 AM EST ----- Dr. Meda Coffee, please clarify if you want pt to continue his ASA 81 mg po daily or stop, with taking Eliquis bid. Please advise!  Thanks, Karlene Einstein  ----- Message ----- From: Dorothy Spark, MD Sent: 11/18/2020   5:15 PM EST To: Nuala Alpha, LPN  It is recommended that the patient will be started on anticoagulation with Eliquis 5 mg po BID and referred for EP for consideration of tachy-brady syndrome.

## 2020-11-19 NOTE — Telephone Encounter (Signed)
Spoke with the pt and informed him that per Dr. Meda Coffee, based on his monitor results, he was in afib and noted tachy-brady syndrome.  Informed the pt based on these findings, Dr. Meda Coffee wants to stop his ASA, and start him on Eliquis 5 mg po bid, and refer him to EP for tachy-brady syndrome.  Confirmed the pharmacy of choice with the pt.  Informed the pt that I will place samples/copay card of his Eliquis at the front desk for him to pick up and start taking tomorrow. Informed the pt that his Eliquis will more than likely kick back to our office to do a prior auth on, which will be done by our Prior Auth Nurse or Pharmacist once received.  Advised the pt to go ahead and start taking the samples left at the front desk for him to pick up, in the interim, until the prior auth is complete.  Reiterated to him to STOP Aspirin. Also informed the pt that I will place the referral to EP in the system, and have our EP [facility], call him back and arrange this appt.  Pt verbalized understanding and agrees with this plan.  Pt states he will pick his Eliquis samples up at the front desk tomorrow morning.  Pt appreciative for all the assistance provided.

## 2020-11-20 ENCOUNTER — Telehealth: Payer: Self-pay | Admitting: Cardiology

## 2020-11-20 NOTE — Telephone Encounter (Signed)
Wife of patient called. Patient would like to talk to [facility] specifically. Please call back tomorrow

## 2020-11-21 NOTE — Telephone Encounter (Signed)
Spoke with Dr. Meda Coffee about this pt, and information his wife provided to [place] this morning, in regards to drinking daily and starting Eliquis.  Per Dr. Meda Coffee, pts renal function and CBC from 2 weeks ago, were within-normal-limits.  Pt should continue with med management, follow-up with Dr. Lovena Le as scheduled for 2/22, and try to limit his alcohol. Made the pts wife aware of recommendations per Dr. Meda Coffee.  Wife verbalized understanding and agrees with this plan.

## 2020-11-21 NOTE — Telephone Encounter (Signed)
Left a message for the pts wife [name] (on [place]), to call the office back, to further assist with call placed yesterday.

## 2020-11-21 NOTE — Telephone Encounter (Signed)
I would continue Eliquis 5 mg po BID, he has Hb 14.9, normal Crea, stop ASA, monitor for bleeding closely

## 2020-11-21 NOTE — Telephone Encounter (Signed)
Pt and wife made aware of plan earlier today and agreed to this plan. ASA was discontinued on Wednesday where Eliquis was initiated.  Pt will see Dr. Lovena Le in EP, for consult on 12/02/20.

## 2020-11-21 NOTE — Telephone Encounter (Signed)
Pts wife [name] (on [place]) is calling with some information on the pts drinking history, while being started on Eliquis.  Wife states her husband drinks daily.  She states he drinks anything from beer to liquor.  She states their whole family has advised him to limit this, but he continues drinking.  Wife states she is concerned, for Dr. Meda Coffee started the pt on Eliquis 5 mg po bid yesterday, for abnormal monitor results. Pt was started on Eliquis and referred to EP for further evaluation.  Pt has a new pt appt with Dr. Lovena Le in EP for further management of his afib and tach-brady syndrome (pt will see Dr. Lovena Le on 2/22).  Wife just wanted to pass this information along to Dr. Meda Coffee and Dr. Lovena Le, for this will need to be discussed in great detail with the pt, when he comes to see Dr. Lovena Le on 2/22.  Informed the pts wife that I will route this message to Dr. Meda Coffee to further review and advise on this matter.  Will also cc this message to Dr. Lovena Le, so he can be aware and further discuss risk factors with the pt, in regards to taking Eliquis and drinking daily, at upcoming visit. Informed the pts wife that the pt must limit his alcohol intake when taking Eliquis.  Informed the pts wife that I will follow-up with her, with any new recommendations from Dr. Meda Coffee. Wife verbalized understanding and agrees with this plan.

## 2020-11-21 NOTE — Telephone Encounter (Signed)
Pt's wife returning call.

## 2020-12-02 ENCOUNTER — Encounter: Payer: Self-pay | Admitting: Internal Medicine

## 2020-12-02 ENCOUNTER — Ambulatory Visit: Payer: Medicare HMO | Admitting: Internal Medicine

## 2020-12-02 ENCOUNTER — Other Ambulatory Visit: Payer: Self-pay

## 2020-12-02 DIAGNOSIS — I495 Sick sinus syndrome: Secondary | ICD-10-CM

## 2020-12-02 MED ORDER — METOPROLOL SUCCINATE ER 50 MG PO TB24: 50.0000 mg | ORAL_TABLET | Freq: Every day | ORAL | 3 refills | Status: DC

## 2020-12-02 NOTE — Progress Notes (Signed)
HPI Vincent Mcclure is referred by Dr. Meda Coffee for evaluation of PAF and tachy-brady syndrome. He is a pleasant 87 yo man with CAD, s/p CABG, colon CA and lung CA. He has not had syncope and does not feel palpitations. He has been saddened by the loss of his son who died recently. He denies chest pain or sob. He admits to indiscretion with ETOH. He thinks that he does not feel as well since his dose of toprol was reduced.  No Known Allergies   Current Outpatient Medications  Medication Sig Dispense Refill   acetaminophen (TYLENOL) 500 MG tablet Take 1 tablet (500 mg total) by mouth every 6 (six) hours as needed for mild pain.  0   apixaban (ELIQUIS) 5 MG TABS tablet Take 1 tablet (5 mg total) by mouth 2 (two) times daily. 60 tablet 11   bacitracin 500 UNIT/GM ointment Apply 1 application topically daily as needed for wound care.     Cholecalciferol (VITAMIN D-3 PO) Take 2 capsules by mouth daily.      hydrochlorothiazide (HYDRODIURIL) 25 MG tablet Take 1 tablet (25 mg total) by mouth daily. 90 tablet 2   metoprolol succinate (TOPROL XL) 25 MG 24 hr tablet Take 1 tablet (25 mg total) by mouth daily. 90 tablet 0   nitroGLYCERIN (NITROSTAT) 0.4 MG SL tablet Place 1 tablet (0.4 mg total) under the tongue every 5 (five) minutes as needed for chest pain (or tightness). 25 tablet 3   pravastatin (PRAVACHOL) 40 MG tablet TAKE 1 TABLET BY MOUTH AT BEDTIME 90 tablet 2   No current facility-administered medications for this visit.     Past Medical History:  Diagnosis Date   Alcohol abuse    Anemia    Arthritis    hands   Blood transfusion without reported diagnosis    Chronic low back pain 10/14/2011   colon ca dx'd 05/18/18   colon cancer   Colon polyps    hyperplastic (2004, 2010) and adenomatous (1990).     COLONIC POLYPS, HX OF 03/05/2008   Esophageal stricture    hx of   Gastric ulcer    GERD 03/05/2008   GERD (gastroesophageal reflux disease) 1994   associated peptic  strictures   HYPERLIPIDEMIA 03/05/2008   HYPERTENSION 03/05/2008   Hypertension    IBS 03/05/2008   Impaired glucose tolerance 10/12/2011   Iron deficiency anemia    LEG CRAMPS 09/02/2010   Lung cancer ([place]) dx'd 06/2020   Myocardial infarction [facility])    May 2018   PAD (peripheral artery disease) ([place]) 10/14/2011   PAD (peripheral artery disease) ([place]) 2013   PERIPHERAL EDEMA 09/02/2010   PERIPHERAL NEUROPATHY 03/05/2008   Prostatitis    hx of   VARICOSE VEINS, LOWER EXTREMITIES 06/09/2009    ROS:   All systems reviewed and negative except as noted in the HPI.   Past Surgical History:  Procedure Laterality Date   CATARACT EXTRACTION  bilat   COLECTOMY     COLONOSCOPY     CORONARY ARTERY BYPASS GRAFT N/A 02/15/2017   Procedure: CORONARY ARTERY BYPASS GRAFTING times three  with left internal mammary harvest and endoscopic harvest of Right SVG. Grafts of LIMA to  LAD, SVG to Distal Circ, and to First Diag.;  Surgeon: Grace Isaac, MD;  Location: [place];  Service: Open Heart Surgery;  Laterality: N/A;   ESOPHAGOGASTRODUODENOSCOPY N/A 11/14/2014   Procedure: ESOPHAGOGASTRODUODENOSCOPY (EGD);  Surgeon: Jerene Bears, MD;  Location: [facility] ENDOSCOPY;  Service: Endoscopy;  Laterality: N/A;   IR RADIOLOGIST EVAL & MGMT  07/04/2018   IR RADIOLOGIST EVAL & MGMT  07/18/2018   LAPAROTOMY N/A 05/19/2018   Procedure: EXPLORATORY LAPAROTOMY RIGHT COLECTOMY;  Surgeon: Fanny Skates, MD;  Location: [facility];  Service: General;  Laterality: N/A;   LEFT HEART CATH AND CORONARY ANGIOGRAPHY N/A 02/14/2017   Procedure: Left Heart Cath and Coronary Angiography;  Surgeon: Belva Crome, MD;  Location: [place] CV LAB;  Service: Cardiovascular;  Laterality: N/A;   LUMBAR SPINE SURGERY  11/2008   Dr Joya Salm   TEE WITHOUT CARDIOVERSION N/A 02/15/2017   Procedure: TRANSESOPHAGEAL ECHOCARDIOGRAM (TEE);  Surgeon: Grace Isaac, MD;  Location: [place];  Service: Open Heart Surgery;  Laterality:  N/A;   UPPER GASTROINTESTINAL ENDOSCOPY       Family History  Problem Relation Age of Onset   Cancer Mother        Brain Cancer   Diabetes Father    Heart disease Father        CAD   Hypertension Father    Stroke Father    Diabetes Sister    Stomach cancer Neg Hx    Pancreatic cancer Neg Hx    Colon cancer Neg Hx    Esophageal cancer Neg Hx    Rectal cancer Neg Hx      Social History   Socioeconomic History   Marital status: Married    Spouse name: Not on file   Number of children: 4   Years of education: Not on file   Highest education level: Not on file  Occupational History   Occupation: Retired Engineer, maintenance  Tobacco Use   Smoking status: Former Smoker    Packs/day: 1.25    Years: 50.00    Pack years: 62.50    Types: Cigarettes    Quit date: 11/11/2008    Years since quitting: 12.0   Smokeless tobacco: Former Systems developer    Types: Chew   Tobacco comment: smoked off and on for years  Vaping Use   Vaping Use: Never used  Substance and Sexual Activity   Alcohol use: Yes    Alcohol/week: 6.0 standard drinks    Types: 2 Glasses of wine, 4 Cans of beer per week    Comment: vodka   Drug use: No   Sexual activity: Never  Other Topics Concern   Not on file  Social History Narrative   ** Merged History Encounter **       4 sons   Social Determinants of Radio broadcast assistant Strain: Not on file  Food Insecurity: Not on file  Transportation Needs: Not on file  Physical Activity: Not on file  Stress: Not on file  Social Connections: Not on file  Intimate Partner Violence: Not on file     BP 140/84    Pulse 88    Ht 5\' 11"  (1.803 m)    Wt 234 lb 9.6 oz (106.4 kg)    SpO2 97%    BMI 32.72 kg/m   Physical Exam:  Well appearing NAD HEENT: Unremarkable Neck:  No JVD, no thyromegally Lymphatics:  No adenopathy Back:  No CVA tenderness Lungs:  Clear with no wheezes HEART:  Regular rate rhythm, no murmurs, no rubs, no  clicks Abd:  soft, positive bowel sounds, no organomegally, no rebound, no guarding Ext:  2 plus pulses, no edema, no cyanosis, no clubbing Skin:  No rashes no nodules Neuro:  CN II through XII intact,  motor grossly intact  EKG - nsr with PAC's   Assess/Plan: 1. PAF - I strongly encouraged the patient to restart his eliquis.  2. Tachy-brady - he has not had syncope. He did not have too much in the way of pauses. He states he felt better on toprol 50 mg daily and I asked him to restart this dose. No indication for PPM yet. 3. ETOH abuse - I encouraged him to limit his ETOH to only a single drink a day. 4. CAD - he denies anginal symptoms. He will continue his current meds.  Carleene Overlie Meldrick Buttery,MD

## 2020-12-02 NOTE — Patient Instructions (Addendum)
Medication Instructions:  Your physician has recommended you make the following change in your medication:  1.  INCREASE your metoprolol succinate--Take 50 mg by mouth once a day.  You can take 2 tablets of your current prescription for a total of 50 mg until those are gone  Labwork: None ordered.  Testing/Procedures: None ordered.  Follow-Up: Your physician wants you to follow-up in: 4 months with Dr. Lovena Le.     Any Other Special Instructions Will Be Listed Below (If Applicable).  If you need a refill on your cardiac medications before your next appointment, please call your pharmacy.

## 2020-12-08 ENCOUNTER — Other Ambulatory Visit: Payer: Self-pay | Admitting: Cardiology

## 2020-12-08 DIAGNOSIS — I2510 Atherosclerotic heart disease of native coronary artery without angina pectoris: Secondary | ICD-10-CM

## 2020-12-08 DIAGNOSIS — I209 Angina pectoris, unspecified: Secondary | ICD-10-CM

## 2020-12-08 DIAGNOSIS — I25810 Atherosclerosis of coronary artery bypass graft(s) without angina pectoris: Secondary | ICD-10-CM

## 2020-12-08 DIAGNOSIS — I214 Non-ST elevation (NSTEMI) myocardial infarction: Secondary | ICD-10-CM

## 2021-01-10 ENCOUNTER — Encounter: Payer: Self-pay | Admitting: Physician Assistant

## 2021-01-10 NOTE — Progress Notes (Addendum)
Cardiology Office Note    Date:  01/13/2021   ID:  Vincent Mcclure, DOB [birth year], MRN [id number]  PCP:  System, Provider Not In  Cardiologist:  Previously Dr Meda Coffee, to be reassigned Electrophysiologist:  Cristopher Peru, MD   Chief Complaint: f/u CAD, afib  History of Present Illness:   Vincent Mcclure is a 87 y.o. male with history of CAD (NSTEMI 2018 s/p CABGx3), PAF, PACs, sinus bradycardia, HTN, HLD (followed by PCP), gastric ulcers with GIB 2016 2/2 ETOH, tobacco abuse, colon CA s/p surgery, NSCLC (radiation). AT time of bypass he developed post-op atrial fib initially not started on anticoagulation at the time. Last echo 2019 EF 55-60%, grade 1 DD, echodense mass in RA appearing to be a prominent eustachian tube previously demonstrated on TEE, mildly increased PASP. He underwent nuclear stress test in in 06/2020 for chest pain that was negative for ischemia. He takes sporadic SL NTG for angina. He was started on isosorbide 15mg  daily at that time but eventually opted not to take it. In general he is wary about starting new medicines. In 10/2020 he saw Dr. Meda Coffee for an episode of syncope that had happened 2 months prior. HR was in the low 50s so Toprol was reduced and he was restarted on baby ASA (he had stopped this after reading about it on the internet). Event monitor showed predominantly NSR but 18% PAF burden and frequent PACs, rare PVCs. Average HR overall 93bpm, minimum 61, max 183. Aspirin was stopped and he was started on Eliquis. He saw Dr. Lovena Le in 11/2020 for potential tachy-brady syndrome. He was not felt to require any intervention beyond BB/Eliquis at this time. Dr. Lovena Le increased Toprol back to 50mg  daily.   The patient is seen back for follow-up today overall reports stability in cardiac symptoms except he feels a few different somatic symptoms including posterior neck cramping, fatigue, occasionally feeling weak. He read about Eliquis on the internet and feels his symptoms  correlate with when he started Eliquis. We discussed the rationale behind anticoagulation and I offered to switch him to Xarelto as a trial but he wishes to give it another week before making a decision. Otherwise denies CP, dyspnea, syncope. Edema seems controlled on HCTZ. Used to be on potassium but no longer taking it for unclear reasons. Drinks 2 alcoholic drinks daily. Discussed importance of cutting down. No bleeding reported.  Labwork independently reviewed: 10/2020 K 3.5, Cr 1.18, LFTs ok, CBC wnl 2020 TSH normal, trig 169, LDL 52, A1C 5.3   Past Medical History:  Diagnosis Date  . Alcohol abuse   . Anemia   . Arthritis    hands  . Blood transfusion without reported diagnosis   . CAD (coronary artery disease)    a. NSTEMI 2018 s/p CABG.  . Chronic low back pain 10/14/2011  . colon ca dx'd 05/18/18   colon cancer  . Colon polyps    hyperplastic (2004, 2010) and adenomatous (1990).    . COLONIC POLYPS, HX OF 03/05/2008  . Esophageal stricture    hx of  . Gastric ulcer   . GERD 03/05/2008  . GERD (gastroesophageal reflux disease) 1994   associated peptic strictures  . HYPERLIPIDEMIA 03/05/2008  . HYPERTENSION 03/05/2008  . Hypertension   . IBS 03/05/2008  . Impaired glucose tolerance 10/12/2011  . Iron deficiency anemia   . LEG CRAMPS 09/02/2010  . Lung cancer ([place]) dx'd 06/2020  . Myocardial infarction [facility])    May 2018  . PAD (  peripheral artery disease) ([place]) 2013  . PAF (paroxysmal atrial fibrillation) ([place])   . PERIPHERAL EDEMA 09/02/2010  . PERIPHERAL NEUROPATHY 03/05/2008  . Premature atrial contraction   . Prostatitis    hx of  . PVC's (premature ventricular contractions)   . Sinus brady-tachy syndrome ([place])   . VARICOSE VEINS, LOWER EXTREMITIES 06/09/2009    Past Surgical History:  Procedure Laterality Date  . CATARACT EXTRACTION  bilat  . COLECTOMY    . COLONOSCOPY    . CORONARY ARTERY BYPASS GRAFT N/A 02/15/2017   Procedure: CORONARY ARTERY BYPASS GRAFTING times  three  with left internal mammary harvest and endoscopic harvest of Right SVG. Grafts of LIMA to  LAD, SVG to Distal Circ, and to First Diag.;  Surgeon: Grace Isaac, MD;  Location: [place];  Service: Open Heart Surgery;  Laterality: N/A;  . ESOPHAGOGASTRODUODENOSCOPY N/A 11/14/2014   Procedure: ESOPHAGOGASTRODUODENOSCOPY (EGD);  Surgeon: Jerene Bears, MD;  Location[facility] ENDOSCOPY;  Service: Endoscopy;  Laterality: N/A;  . IR RADIOLOGIST EVAL & MGMT  07/04/2018  . IR RADIOLOGIST EVAL & MGMT  07/18/2018  . LAPAROTOMY N/A 05/19/2018   Procedure: EXPLORATORY LAPAROTOMY RIGHT COLECTOMY;  Surgeon: Fanny Skates, MD;  [facility];  Service: General;  Laterality: N/A;  . LEFT HEART CATH AND CORONARY ANGIOGRAPHY N/A 02/14/2017   Procedure: Left Heart Cath and Coronary Angiography;  Surgeon: Belva Crome, MD;  Location: [place] CV LAB;  Service: Cardiovascular;  Laterality: N/A;  . LUMBAR SPINE SURGERY  11/2008   Dr Joya Salm  . TEE WITHOUT CARDIOVERSION N/A 02/15/2017   Procedure: TRANSESOPHAGEAL ECHOCARDIOGRAM (TEE);  Surgeon: Grace Isaac, MD;  Location: [place];  Service: Open Heart Surgery;  Laterality: N/A;  . UPPER GASTROINTESTINAL ENDOSCOPY      Current Medications: Current Meds  Medication Sig  . acetaminophen (TYLENOL) 500 MG tablet Take 1 tablet (500 mg total) by mouth every 6 (six) hours as needed for mild pain.  Mar[place] Kitchen apixaban (ELIQUIS) 5 MG TABS tablet Take 1 tablet (5 mg total) by mouth 2 (two) times daily.  . bacitracin 500 UNIT/GM ointment Apply 1 application topically daily as needed for wound care.  . Cholecalciferol (VITAMIN D-3 PO) Take 2 capsules by mouth daily.   . hydrochlorothiazide (HYDRODIURIL) 25 MG tablet Take 1 tablet by mouth once daily  . metoprolol succinate (TOPROL-XL) 50 MG 24 hr tablet Take 1 tablet (50 mg total) by mouth daily. Take with or immediately following a meal.  . nitroGLYCERIN (NITROSTAT) 0.4 MG SL tablet Place 1 tablet (0.4 mg total) under the tongue  every 5 (five) minutes as needed for chest pain (or tightness).  . pravastatin (PRAVACHOL) 40 MG tablet TAKE 1 TABLET BY MOUTH AT BEDTIME     Allergies:   Patient has no known allergies.   Social History   Socioeconomic History  . Marital status: Married    Spouse name: Not on file  . Number of children: 4  . Years of education: Not on file  . Highest education level: Not on file  Occupational History  . Occupation: Retired Engineer, maintenance  Tobacco Use  . Smoking status: Former Smoker    Packs/day: 1.25    Years: 50.00    Pack years: 62.50    Types: Cigarettes    Quit date: 11/11/2008    Years since quitting: 12.1  . Smokeless tobacco: Former Systems developer    Types: Chew  . Tobacco comment: smoked off and on for years  Vaping Use  .  Vaping Use: Never used  Substance and Sexual Activity  . Alcohol use: Yes    Alcohol/week: 6.0 standard drinks    Types: 2 Glasses of wine, 4 Cans of beer per week    Comment: vodka  . Drug use: No  . Sexual activity: Never  Other Topics Concern  . Not on file  Social History Narrative   ** Merged History Encounter **       4 sons   Social Determinants of Radio broadcast assistant Strain: Not on file  Food Insecurity: Not on file  Transportation Needs: Not on file  Physical Activity: Not on file  Stress: Not on file  Social Connections: Not on file     Family History:  The patient's family history includes Cancer in his mother; Diabetes in his father and sister; Heart disease in his father; Hypertension in his father; Stroke in his father. There is no history of Stomach cancer, Pancreatic cancer, Colon cancer, Esophageal cancer, or Rectal cancer.  ROS:   Please see the history of present illness. All other systems are reviewed and otherwise negative.    EKGs/Labs/Other Studies Reviewed:    Studies reviewed are outlined and summarized above. Reports included below if pertinent.  2D Echo 05/2018 - Left ventricle: The cavity size was  normal. Wall thickness was  increased in a pattern of mild LVH. Systolic function was normal.  The estimated ejection fraction was in the range of 55% to 60%.  Wall motion was normal; there were no regional wall motion  abnormalities. Doppler parameters are consistent with abnormal  left ventricular relaxation (grade 1 diastolic dysfunction).  - Aortic valve: Mildly calcified annulus. Trileaflet; mildly  thickened leaflets. Valve area (VTI): 4.6 cm^2. Valve area  (Vmax): 4.22 cm^2.  - Right atrium: There is a 1.2 by 1.6 cm echodense mass in the  right atrium, overall poorly visualized but best seen in the  apical 4 chamber views. The subcostal views are limited . When  reviewing the 02/15/17 TEE this area is also poorly visualized, but  does appear to be due to a prominent eustachian valve, a benign  finding.  - Pulmonary arteries: Systolic pressure was mildly increased. PA  peak pressure: 37 mm Hg (S).  - Systemic veins: IVC is small suggesting low RA pressure and  hypovolemia.  Cardiac Cath 02/2017  Heavily calcified left main and proximal to mid LAD.  Eccentric 75% distal left main  Mid LAD [place] 0, 1, 1 bifurcation stenosis with 90% LAD and 75% second diagonal.  Subtotal occlusion, 99%, of the moderate sized first diagonal, possibly the source of the patient's presentation.  Ostial 80% circumflex. The distal circumflex is relatively small with 2 small to intermediate size branches distally.  Mid to distal 70% PDA. The right coronary is dominant and otherwise widely patent.  Mild hypokinesis mid anterior wall, likely due to occlusion of the first diagonal. Overall LV function is normal with EF 55%. Normal left ventricular filling pressures.  Recommendations:   Surgical consultation via TCTS for consideration of bypass surgery due to left main coronary disease and complex mid LAD stenoses.  Monitor 11/2020  Patient had a min HR of 61 bpm, max HR  of 183 bpm, and avg HR of 93 bpm.  Predominant underlying rhythm was Sinus Rhythm.  Atrial Fibrillation occurred (18% burden), ranging from 67-183 bpm (avg of 113 bpm), the longest lasting 4 hours 4 mins with an avg rate of 111 bpm.  Isolated PACs were frequent (  12.3%, [id number]), PAC Couplets and triplets were frequent (5.0% and 4.9%).  Isolated PVCs were rare (<1.0%), PVC Couplets were rare (<1.0%, 72).   Predominant underlying rhythm was Sinus Rhythm.  Atrial Fibrillation occurred (18% burden), ranging from 67-183 bpm (avg of 113 bpm), the longest lasting 4 hours 4 mins with an avg  rate of 111 bpm.  Frequent PACs.        EKG:  EKG is ordered today, personally reviewed, demonstrating wandering atrial pacemaker 79bpm, nonspecific TW changes, no acute STT changes from prior  Recent Labs: 11/06/2020: ALT 12; BUN 12; Creatinine 1.18; Hemoglobin 14.9; Platelet Count 151; Potassium 3.5; Sodium 136  Recent Lipid Panel    Component Value Date/Time   CHOL 133 04/24/2019 0917   TRIG 169 (H) 04/24/2019 0917   HDL 47 04/24/2019 0917   CHOLHDL 2.8 04/24/2019 0917   CHOLHDL 3.4 02/12/2017 0657   VLDL 17 02/12/2017 0657   LDLCALC 52 04/24/2019 0917   LDLDIRECT 64 04/24/2019 0917   LDLDIRECT 133.2 10/14/2011 1106    PHYSICAL EXAM:    VS:  BP 128/84   Pulse 79   Ht 5\' 11"  (1.803 m)   Wt 231 lb (104.8 kg)   SpO2 98%   BMI 32.22 kg/m   BMI: Body mass index is 32.22 kg/m.  GEN: Well nourished, well developed male in no acute distress HEENT: normocephalic, atraumatic Neck: no JVD, carotid bruits, or masses Cardiac: regular rhythm with intermittent irregularity; no murmurs, rubs, or gallops, no significant edema  Respiratory:  clear to auscultation bilaterally, normal work of breathing GI: soft, nontender, nondistended, + BS MS: no deformity or atrophy Skin: warm and dry, no rash Neuro:  Alert and Oriented x 3, Strength and sensation are intact, follows commands Psych: euthymic mood,  full affect  Wt Readings from Last 3 Encounters:  01/13/21 231 lb (104.8 kg)  12/02/20 234 lb 9.6 oz (106.4 kg)  11/10/20 234 lb 9.6 oz (106.4 kg)     ASSESSMENT & PLAN:   1.Paroxysmal atrial fib, also with h/o sinus bradycardia - EKG appears to show wandering atrial pacemaker today with normal rate. He is very suspicious that Eliquis may be causing various symptoms noted above. We discussed the rationale behind anticoagulation and I offered to switch him to Xarelto as a trial but he wishes to give it another week before making a decision. He will keep [place] posted how he feels. I also encouraged him to f/u with his primary care given that these side effects are a little atypical for Eliquis and he has a complex non-cardiac PMH as well. I will update his basic labs to ensure no metabolic changes including baseline TSH, CBC, BMET/Mg. Plan/EKG was reviewed with Dr. Lovena Le who agreed. If labs are unrevealing, may suggest we go ahead and update his echocardiogram to ensure no changes in LV function or valvular disease. Reiterated Dr. Tanna Furry recommendation to cut down ETOH.  2. CAD s/p CABG - reports he is doing well overall from this standpoint with recent change in baseline. No longer on ASA due to Eliquis. Patient previously declined to take isosorbide. In general he does not like starting new medicines. Given intermittent muscle aches will not escalate statin potency at this time, but we can consider decreasing the dose if his muscle cramps do not improve.  3. Essential HTN - BP controlled. No other changes made today.  4. HLD - last labs were checked in 2020. He is not fasting today but I do not necessarily  know that he needs to come back specifically to have this done fasting. We'll update his lipid panel today and reassess from there.  Disposition: F/u with Dr. Johney Frame to establish care in 3 months.  Medication Adjustments/Labs and Tests Ordered: Current medicines are reviewed at length with  the patient today.  Concerns regarding medicines are outlined above. Medication changes, Labs and Tests ordered today are summarized above and listed in the Patient Instructions accessible in Encounters.   Signed, Charlie Pitter, PA-C  01/13/2021 9:47 AM    [address], [place], [place]  [address] Phone: [phone]; Fax: [phone]

## 2021-01-13 ENCOUNTER — Encounter: Payer: Self-pay | Admitting: Physician Assistant

## 2021-01-13 ENCOUNTER — Ambulatory Visit: Payer: Medicare HMO | Admitting: Physician Assistant

## 2021-01-13 ENCOUNTER — Other Ambulatory Visit: Payer: Self-pay

## 2021-01-13 VITALS — BP 128/84 | HR 79 | Ht 71.0 in | Wt 231.0 lb

## 2021-01-13 DIAGNOSIS — I480 Paroxysmal atrial fibrillation: Secondary | ICD-10-CM

## 2021-01-13 DIAGNOSIS — I2510 Atherosclerotic heart disease of native coronary artery without angina pectoris: Secondary | ICD-10-CM | POA: Diagnosis not present

## 2021-01-13 DIAGNOSIS — E785 Hyperlipidemia, unspecified: Secondary | ICD-10-CM | POA: Diagnosis not present

## 2021-01-13 DIAGNOSIS — E782 Mixed hyperlipidemia: Secondary | ICD-10-CM

## 2021-01-13 DIAGNOSIS — I25810 Atherosclerosis of coronary artery bypass graft(s) without angina pectoris: Secondary | ICD-10-CM | POA: Diagnosis not present

## 2021-01-13 DIAGNOSIS — I10 Essential (primary) hypertension: Secondary | ICD-10-CM | POA: Diagnosis not present

## 2021-01-13 LAB — TSH: TSH: 0.886 u[IU]/mL (ref 0.450–4.500)

## 2021-01-13 LAB — LIPID PANEL
Chol/HDL Ratio: 2.7 ratio (ref 0.0–5.0)
Cholesterol, Total: 128 mg/dL (ref 100–199)
HDL: 48 mg/dL (ref >39)
LDL Chol Calc (NIH): 54 mg/dL (ref 0–99)
Triglycerides: 150 mg/dL — ABNORMAL HIGH (ref 0–149)
VLDL Cholesterol Cal: 26 mg/dL (ref 5–40)

## 2021-01-13 LAB — MAGNESIUM: Magnesium: 1.9 mg/dL (ref 1.6–2.3)

## 2021-01-13 LAB — BASIC METABOLIC PANEL
BUN/Creatinine Ratio: 15 (ref 10–24)
BUN: 17 mg/dL (ref 8–27)
CO2: 25 mmol/L (ref 20–29)
Calcium: 9.6 mg/dL (ref 8.6–10.2)
Chloride: 99 mmol/L (ref 96–106)
Creatinine, Ser: 1.11 mg/dL (ref 0.76–1.27)
Glucose: 132 mg/dL — ABNORMAL HIGH (ref 65–99)
Potassium: 3.7 mmol/L (ref 3.5–5.2)
Sodium: 138 mmol/L (ref 134–144)
eGFR: 64 mL/min/{1.73_m2} (ref >59)

## 2021-01-13 LAB — CBC
Hematocrit: 43.7 % (ref 37.5–51.0)
Hemoglobin: 14.9 g/dL (ref 13.0–17.7)
MCH: 30.2 pg (ref 26.6–33.0)
MCHC: 34.1 g/dL (ref 31.5–35.7)
MCV: 89 fL (ref 79–97)
Platelets: 167 10*3/uL (ref 150–450)
RBC: 4.94 x10E6/uL (ref 4.14–5.80)
RDW: 12.2 % (ref 11.6–15.4)
WBC: 4 10*3/uL (ref 3.4–10.8)

## 2021-01-13 MED ORDER — PRAVASTATIN SODIUM 40 MG PO TABS: 40.0000 mg | ORAL_TABLET | Freq: Every day | ORAL | 3 refills | Status: DC

## 2021-01-13 NOTE — Patient Instructions (Addendum)
Medication Instructions:  Your physician recommends that you continue on your current medications as directed. Please refer to the Current Medication list given to you today.  *If you need a refill on your cardiac medications before your next appointment, please call your pharmacy*   Lab Work: TODAY:  BMET, MAG, LIPID, CBC, & TSH   If you have labs (blood work) drawn today and your tests are completely normal, you will receive your results only by: Marland Kitchen MyChart Message (if you have MyChart) OR . A paper copy in the mail If you have any lab test that is abnormal or we need to change your treatment, we will call you to review the results.   Testing/Procedures: None ordered   Follow-Up: At [facility], you and your health needs are our priority.  As part of our continuing mission to provide you with exceptional heart care, we have created designated Provider Care Teams.  These Care Teams include your primary Cardiologist (physician) and Advanced Practice Providers (APPs -  Physician Assistants and Nurse Practitioners) who all work together to provide you with the care you need, when you need it.  We recommend signing up for the patient portal called "MyChart".  Sign up information is provided on this After Visit Summary.  MyChart is used to connect with patients for Virtual Visits (Telemedicine).  Patients are able to view lab/test results, encounter notes, upcoming appointments, etc.  Non-urgent messages can be sent to your provider as well.   To learn more about what you can do with MyChart, go to [email or web address].    Your next appointment:   3 month(s)  The format for your next appointment:   In Person  Provider:   Gwyndolyn Kaufman, MD   Other Instructions

## 2021-01-30 NOTE — Progress Notes (Signed)
[facility]   [phone] [phone]) [phone]   Clinic Follow up Note   Patient Care Team: Program, [facility] as PCP - General Lovena Le, Champ Mungo, MD as PCP - Electrophysiology (Cardiology) Pyrtle, Lajuan Lines, MD as Consulting Physician (Gastroenterology) Allyn Kenner, MD as Consulting Physician (Dermatology) Dorothy Spark, MD as Consulting Physician (Cardiology) Levin Erp, [place] as Physician Assistant (Gastroenterology) Murrell Converse as Physician Assistant (Cardiology)  Date of Service:  02/04/2021  CHIEF COMPLAINT: F/u on stage III colon cancerand lung cancer  SUMMARY OF ONCOLOGIC HISTORY: Oncology History Overview Note  Cancer Staging Cancer of ascending colon pT4apN1 s/p right colectomy 05/19/2018 Staging form: Colon and Rectum, AJCC 8th Edition - Pathologic stage from 05/19/2018: Stage IIIB (pT4a, pN1, cM0) - Signed by Truitt Merle, MD on 05/24/2018     Cancer of ascending colon pT4apN1 s/p right colectomy 05/19/2018  05/18/2018 Imaging   05/18/2018 CT AP IMPRESSION: 1. There appears to be a nearly obstructing lesion in the region of the hepatic flexure of the colon which is highly concerning for primary colonic neoplasm. Slight haziness of the surrounding soft tissues may be indicative of early local invasion. This appears partially obstructive as evidenced by dilatation of more proximal aspects of the small bowel and colon. 2. Left adrenal lesion is stable compared to prior studies, previously characterized as an adenoma. 3. Aortic atherosclerosis, in addition to left main and 3 vessel coronary artery disease. Status post median sternotomy for CABG. 4. Additional incidental findings, as above.  Aortic Atherosclerosis (ICD10-I70.0).   05/18/2018 - 05/30/2018 Hospital Admission   Admit date: 05/18/2018 Admission diagnosis: Colon Cancer Discharged on: 05/30/2018   05/19/2018 Cancer Staging   Staging form: Colon and  Rectum, AJCC 8th Edition - Pathologic stage from 05/19/2018: Stage IIIB (pT4a, pN1, cM0) - Signed by Truitt Merle, MD on 05/24/2018    05/19/2018 Pathology Results   05/19/2018 Surgical Pathology Diagnosis Colon, segmental resection for tumor, right - INVASIVE COLORECTAL ADENOCARCINOMA, 5 CM. - CARCINOMA FOCALLY LESS THAN 0.1 CM FROM SEROSAL SURFACE. - MARGINS NOT INVOLVED. - METASTATIC CARCINOMA IN ONE OF EIGHT LYMPH NODES (1/9). - BENIGN APPENDIX.   05/19/2018 Surgery   EXPLORATORY LAPAROTOMY with right colectomy with Fanny Skates, MD   05/24/2018 Initial Diagnosis   Cancer of right colon ([place])   06/12/2018 Imaging    06/12/2018 CT CAP IMPRESSION: 1. Evidence all bowel perforation/anastomosed breakdown with large volume intraperitoneal free air fluid in the RIGHT upper quadrant adjacent and above the liver. 2. Large loculated fluid collection in the RIGHT anterior pelvis extending along the LEFT pericolic gutter consists with PERITONEAL ABSCESS. 3. Of note, no large volume intraperitoneal free fluid. Oral contrast passed through the RIGHT colon anastomosis without evidence of leak. Findings could indicate a prior anastomotic breakdown which has healed in the interval with subsequent abscess formation from the prior leak / breakdown.   06/17/2018 Procedure   06/17/2018 US Thoracentesis IMPRESSION: Successful ultrasound guided right thoracentesis yielding 1 L of pleural fluid. No pneumothorax on post-procedure chest x-ray.  No malignancy on pathology   06/20/2018 Imaging   06/20/2018 CT AP IMPRESSION: 1. Decompression of right upper quadrant air and fluid collection after percutaneous catheter drainage. There remains a tiny rim of fluid as well as small extraluminal gas bubbles in this region suspicious for continued leak from bowel. 2. Further decrease in size of residual abscess fluid collection in the pelvis after percutaneous catheter drainage. There remains some fluid around  the drainage catheter in the right pelvis.   07/04/2018 Imaging   07/04/2018 CT AP IMPRESSION: 1. Further decompression of right perihepatic abscess with resolution of extraluminal air. 2. Relatively stable volume of fluid surrounding the right pelvic drainage catheter.   07/05/2018 Imaging   07/05/2018 CT AP IMPRESSION: 1. No change compared to the CT scan performed 9 hours earlier. 2. Anterior right perihepatic space collection is decompressed by indwelling percutaneous drain, with no residual measurable collection in this location. 3. Thick walled right lower quadrant collection with indwelling percutaneous drain is stable. No new focal fluid collections. 4. Stable postsurgical changes from right hemicolectomy with no evidence of bowel obstruction. Stable reactive wall thickening in the sigmoid colon adjacent to the right lower quadrant collection. 5. Stable small to moderate dependent right pleural effusion. 6. Stable left adrenal adenoma. 7.  Aortic Atherosclerosis (ICD10-I70.0).   07/31/2018 - 01/21/2019 Chemotherapy   Adjuvant Xeloda 2000mg  bid, 7 days on, 7 days off.  Reduced to 2000mg  in the AM and 1500mg  in the PM due to diarrhea. Completed on 01/21/19    04/12/2019 Imaging   CT CAP W contrast 04/12/19  IMPRESSION: 1. Status post right hemicolectomy. 2. No definite metastatic disease in the chest, abdomen, or pelvis. 7 mm right lower lobe pulmonary nodule, obscured by atelectasis on previous chest CT and not included on multiple prior abdomen / pelvis CTs. Close follow-up recommended to ensure stability. 3.  Aortic Atherosclerois (ICD10-170.0)   09/04/2019 Imaging   CT AP W Contrast I MPRESSION: 1. No evidence of local colon cancer recurrence at the RIGHT hemicolectomy site. 2. No evidence of metastatic disease in the abdomen pelvis. 3. Mild nodularity at the LEFT lung base is favored benign. Recommend attention on routine surveillance 4. Aortic Atherosclerosis  (ICD10-I70.0).   07/02/2020 Relapse/Recurrence   FINAL MICROSCOPIC DIAGNOSIS:   A. LUNG, RIGHT LOWER LOBE, NEEDLE CORE BIOPSY:  - Non-small cell carcinoma, see comment    COMMENT:   Immunohistochemical stains for characterization of the tumor phenotype  are pending and will be reported in an addendum.  Dr. Tresa Moore reviewed the  case and concurs with the diagnosis. Dr. [name] was notified on 07/03/2020.    Malignant neoplasm of bronchus of right lower lobe ([place])  05/30/2020 Imaging   CT CAP w contrast IMPRESSION: 1. Significant enlargement of a right lower lobe pulmonary nodule. Suspicious for primary bronchogenic carcinoma. Isolated pulmonary metastasis felt less likely. 2. Otherwise, no evidence of metastatic disease from patient's colon cancer. 3. Aortic atherosclerosis (ICD10-I70.0) and emphysema (ICD10-J43.9). 4. Left adrenal adenoma.   These results will be called to the ordering clinician or representative by the Radiologist Assistant, and communication documented in the PACS or [employer].   06/11/2020 PET scan   IMPRESSION: 1. The index nodule within the superior segment of the right lower lobe is FDG avid and worrisome for either primary bronchogenic neoplasm versus metastatic disease 2. Within the anteromedial aspect of the left maxillary sinus, in the region of the nasolacrimal duct, there is a focal soft tissue attenuating filling defect which exhibits intense FDG uptake within SUV max of 55.8. The although focal uptake within the paranasal sinuses may be seen with sinusitis primary neoplasm of the sinuses or nasal lacrimal duct cannot be excluded. In light of the intense FDG uptake in this area further investigation with contrast enhanced CT or MRI of the soft tissues of neck to include the maxillary sinuses is recommended. 3.  Aortic Atherosclerosis (ICD10-I70.0). 4. Coronary artery calcifications 5.  Prior granulomatous disease.     07/02/2020 Cancer  Staging   Staging form: Lung, AJCC 8th Edition - Clinical stage from 07/02/2020: Stage IA2 (cT1b, cN0, cM0) - Signed by Truitt Merle, MD on 07/06/2020   07/06/2020 Initial Diagnosis   Non-small cell lung cancer, right ([place])   07/30/2020 - 08/08/2020 Radiation Therapy   SBRT to lung 07/16/20-08/04/20 with Dr Lisbeth Renshaw.    11/07/2020 Imaging   CT Chest  IMPRESSION: 1. Decreased size of dominant area of nodularity in the RIGHT chest with surrounding post treatment changes. 2. New area of nodularity along the pleural surface along the posterosuperior segment of the RIGHT lower lobe is suspicious but indeterminate at this time. Could consider short interval follow-up or PET imaging for further assessment as this area would be challenging for tissue sampling. There is also the possibility this area could somehow related to post treatment changes as well, though the morphologic features do raise suspicion. 3. Stable LEFT adrenal adenoma. 4. Changes of CABG. 5. Aortic atherosclerosis.   Aortic Atherosclerosis (ICD10-I70.0).      CURRENT THERAPY:  Surveillance  INTERVAL HISTORY:  Vincent Mcclure is here for a follow up of colon and lung cancer. He was last seen by me 3 months ago. He presents to the clinic alone. He notes he is doing fairly well. He notes mild chest tightness but did not require nitroglycerin. He notes COPD He notes h/o cough with phlegm due to COPD. He was started on Eliquis 5mg  BID Eliquis. He has been taking it half tablet BID due to side effects. I reviewed his medication list. He notes he still does not feel Afib. He denies stomach issues. He notes continued change in BM since his colon surgery. He notes he is eating adequately. He is able to do some yard work with his grass and gardening.   He notes he will drink alcohol with some meals     REVIEW OF SYSTEMS:   Constitutional: Denies fevers, chills or abnormal weight loss Eyes: Denies blurriness of vision Ears, nose,  mouth, throat, and face: Denies mucositis or sore throat Respiratory: Denies cough, dyspnea or wheezes Cardiovascular: Denies palpitation, chest discomfort or lower extremity swelling Gastrointestinal:  Denies nausea, heartburn or change in bowel habits Skin: Denies abnormal skin rashes Lymphatics: Denies new lymphadenopathy or easy bruising Neurological:Denies numbness, tingling or new weaknesses Behavioral/Psych: Mood is stable, no new changes  All other systems were reviewed with the patient and are negative.  MEDICAL HISTORY:  Past Medical History:  Diagnosis Date  . Alcohol abuse   . Anemia   . Arthritis    hands  . Blood transfusion without reported diagnosis   . CAD (coronary artery disease)    a. NSTEMI 2018 s/p CABG.  . CHF (congestive heart failure) ([place])   . Chronic low back pain 10/14/2011  . colon ca dx'd 05/18/18   colon cancer  . Colon polyps    hyperplastic (2004, 2010) and adenomatous (1990).    . COLONIC POLYPS, HX OF 03/05/2008  . Esophageal stricture    hx of  . Gastric ulcer   . GERD 03/05/2008  . GERD (gastroesophageal reflux disease) 1994   associated peptic strictures  . HYPERLIPIDEMIA 03/05/2008  . HYPERTENSION 03/05/2008  . Hypertension   . IBS 03/05/2008  . Impaired glucose tolerance 10/12/2011  . Iron deficiency anemia   . LEG CRAMPS 09/02/2010  . Lung cancer ([place]) dx'd 06/2020  . Myocardial infarction [facility])    May 2018  .  PAD (peripheral artery disease) ([place]) 2013  . PAF (paroxysmal atrial fibrillation) ([place])   . PERIPHERAL EDEMA 09/02/2010  . PERIPHERAL NEUROPATHY 03/05/2008  . Premature atrial contraction   . Prostatitis    hx of  . PVC's (premature ventricular contractions)   . Sinus brady-tachy syndrome ([place])   . VARICOSE VEINS, LOWER EXTREMITIES 06/09/2009    SURGICAL HISTORY: Past Surgical History:  Procedure Laterality Date  . CATARACT EXTRACTION  bilat  . COLECTOMY    . COLONOSCOPY    . CORONARY ARTERY BYPASS GRAFT N/A 02/15/2017    Procedure: CORONARY ARTERY BYPASS GRAFTING times three  with left internal mammary harvest and endoscopic harvest of Right SVG. Grafts of LIMA to  LAD, SVG to Distal Circ, and to First Diag.;  Surgeon: Grace Isaac, MD;  Location: [facility];  Service: Open Heart Surgery;  Laterality: N/A;  . ESOPHAGOGASTRODUODENOSCOPY N/A 11/14/2014   Procedure: ESOPHAGOGASTRODUODENOSCOPY (EGD);  Surgeon: Jerene Bears, MD;  Location[facility] ENDOSCOPY;  Service: Endoscopy;  Laterality: N/A;  . IR RADIOLOGIST EVAL & MGMT  07/04/2018  . IR RADIOLOGIST EVAL & MGMT  07/18/2018  . LAPAROTOMY N/A 05/19/2018   Procedure: EXPLORATORY LAPAROTOMY RIGHT COLECTOMY;  Surgeon: Fanny Skates, MD;  Location: [facility] ORS;  Service: General;  Laterality: N/A;  . LEFT HEART CATH AND CORONARY ANGIOGRAPHY N/A 02/14/2017   Procedure: Left Heart Cath and Coronary Angiography;  Surgeon: Belva Crome, MD;  Location: [facility] CV LAB;  Service: Cardiovascular;  Laterality: N/A;  . LUMBAR SPINE SURGERY  11/2008   Dr Joya Salm  . TEE WITHOUT CARDIOVERSION N/A 02/15/2017   Procedure: TRANSESOPHAGEAL ECHOCARDIOGRAM (TEE);  Surgeon: Grace Isaac, MD;  Location: [facility];  Service: Open Heart Surgery;  Laterality: N/A;  . UPPER GASTROINTESTINAL ENDOSCOPY      I have reviewed the social history and family history with the patient and they are unchanged from previous note.  ALLERGIES:  has No Known Allergies.  MEDICATIONS:  Current Outpatient Medications  Medication Sig Dispense Refill  . acetaminophen (TYLENOL) 500 MG tablet Take 1 tablet (500 mg total) by mouth every 6 (six) hours as needed for mild pain.  0  . apixaban (ELIQUIS) 5 MG TABS tablet Take 1 tablet (5 mg total) by mouth 2 (two) times daily. 60 tablet 11  . bacitracin 500 UNIT/GM ointment Apply 1 application topically daily as needed for wound care.    . Cholecalciferol (VITAMIN D-3 PO) Take 2 capsules by mouth daily.     . hydrochlorothiazide (HYDRODIURIL) 25 MG tablet Take 1 tablet by  mouth once daily 90 tablet 3  . metoprolol succinate (TOPROL-XL) 50 MG 24 hr tablet Take 1 tablet (50 mg total) by mouth daily. Take with or immediately following a meal. 90 tablet 3  . nitroGLYCERIN (NITROSTAT) 0.4 MG SL tablet Place 1 tablet (0.4 mg total) under the tongue every 5 (five) minutes as needed for chest pain (or tightness). 25 tablet 3  . pravastatin (PRAVACHOL) 40 MG tablet Take 1 tablet (40 mg total) by mouth at bedtime. 90 tablet 3   No current facility-administered medications for this visit.    PHYSICAL EXAMINATION: ECOG PERFORMANCE STATUS: 1 - Symptomatic but completely ambulatory  Vitals:   02/04/21 1314  BP: (!) 145/84  Pulse: 91  Resp: 20  Temp: (!) 97 F (36.1 C)  SpO2: 97%   Filed Weights   02/04/21 1314  Weight: 231 lb 11.2 oz (105.1 kg)    GENERAL:alert, no distress and comfortable  Due to [place] we will limit examination to appearance. Patient had no complaints.  GENERAL:alert, no distress and comfortable SKIN: skin color normal, no rashes or significant lesions EYES: normal, Conjunctiva are pink and non-injected, sclera clear  NEURO: alert & oriented x 3 with fluent speech   LABORATORY DATA:  I have reviewed the data as listed CBC Latest Ref Rng & Units 02/02/2021 01/13/2021 11/06/2020  WBC 4.0 - 10.5 K/uL 4.9 4.0 4.2  Hemoglobin 13.0 - 17.0 g/dL 15.2 14.9 14.9  Hematocrit 39.0 - 52.0 % 45.0 43.7 43.6  Platelets 150 - 400 K/uL 148(L) 167 151     CMP Latest Ref Rng & Units 02/02/2021 01/13/2021 11/06/2020  Glucose 70 - 99 mg/dL 98 132(H) 214(H)  BUN 8 - 23 mg/dL 24(H) 17 12  Creatinine 0.61 - 1.24 mg/dL 1.03 1.11 1.18  Sodium 135 - 145 mmol/L 140 138 136  Potassium 3.5 - 5.1 mmol/L 3.7 3.7 3.5  Chloride 98 - 111 mmol/L 103 99 101  CO2 22 - 32 mmol/L 25 25 24   Calcium 8.9 - 10.3 mg/dL 9.6 9.6 9.3  Total Protein 6.5 - 8.1 g/dL 6.9 - 6.6  Total Bilirubin 0.3 - 1.2 mg/dL 0.7 - 0.7  Alkaline Phos 38 - 126 U/L 65 - 76  AST 15 - 41 U/L 17 - 16   ALT 0 - 44 U/L 13 - 12      RADIOGRAPHIC STUDIES: I have personally reviewed the radiological images as listed and agreed with the findings in the report. No results found.   ASSESSMENT & PLAN:  MONTA POLICE is a 87 y.o. male with    1.Non-small cell lung cancer in right lower lobe, squamous cell carcinoma, cT1bN0M0 stage IA -This was discovered on his8/20/21surveillance CT scan for colon cancer, the lesion was hypermetabolic on 5/4/27[facility], no other distant metastasis. -His 9/22/21fine-needle biopsy of the lung mass showed squamous cell carcinoma.He had a history of heavy smoking. -He was treated with SBRT to lung 07/30/20-08/08/20 with Dr Lisbeth Renshaw.He is now on surveillance.  -We discussed his CT CAP from 02/02/21 which showed No findings of locally recurrent colon cancer or new sites of disease. There is radiation changes in his lung. I personally reviewed scan with patient today.  -He is clinically doing well from a cancer standpoint. Labs reviewed, CBC and CMP WNL except plt 148K, CEA is still pending. There is no clinical concern for recurrence.  -Continue surveillance. Breathing is stable.  -F/u in 4 months, then every 6 month after next visit   2. Adenocarcinoma of right colon, invasive adenocarcinoma, stage IIIB (pT4a, pN1, cM0), MSS -Diagnosed in 05/2018. Treated with hemicolectomy. HereceivedadjuvantXeloda 2000 mg BID one week on, one week offfrom10/21/2019 to 01/29/2019. -Heis clinically doing well, no evidence of recurrence so far. Latest CT CAP from 02/02/21 showed NED.  -Continue surveillance. Plan to repeat CT in 6 months   3. Left maxillary sinus mass -He was found to have a hypermetabolic left maxillary sinus lesion.His CT [place] on 08/05/20 showed 92mm mass in the left nasal lacrimal duct.It has slightly enlarged since 2016 -He was seen by Dr Lucia Gaskins who removed what he could in late 2021. There was remaining mass in left maxillary sinus. He was seen by  Specialist Dr Lorina Rabon who ordered MRI. His 09/08/20 MRI showed mass is still present in left lacrimal duct mass with obstruction.  -He will continue to f/u with Dr Lorina Rabon   4. CAD S/P CABG, Afib -F/u withDr Opalski and Dr Meda Coffee.Continue on  medications -He had episode of Afib in 07/2020. S/p heart monitor, he was started on Eliquis in 11/2020, given tolerance issue he only takes half tablet BID.  -He does have mild cough which he attributes to COPD.     Plan -Lab and CT scan reviewed, NED -Lab and f/u in 4 months    No problem-specific Assessment & Plan notes found for this encounter.   No orders of the defined types were placed in this encounter.  All questions were answered. The patient knows to call the clinic with any problems, questions or concerns. No barriers to learning was detected. The total time spent in the appointment was 30 minutes.     Truitt Merle, MD 02/04/2021   I, Joslyn Devon, am acting as scribe for Truitt Merle, MD.   I have reviewed the above documentation for accuracy and completeness, and I agree with the above.

## 2021-02-02 ENCOUNTER — Other Ambulatory Visit: Payer: Self-pay

## 2021-02-02 ENCOUNTER — Inpatient Hospital Stay: Payer: Medicare HMO | Attending: Nurse Practitioner

## 2021-02-02 ENCOUNTER — Encounter (HOSPITAL_COMMUNITY): Payer: Self-pay

## 2021-02-02 ENCOUNTER — Ambulatory Visit (HOSPITAL_COMMUNITY)
Admission: RE | Admit: 2021-02-02 | Discharge: 2021-02-02 | Disposition: A | Discharge status: Home / Self Care | Payer: Medicare HMO | Source: Ambulatory Visit | Attending: Hematology | Admitting: Hematology

## 2021-02-02 DIAGNOSIS — I4891 Unspecified atrial fibrillation: Secondary | ICD-10-CM | POA: Diagnosis not present

## 2021-02-02 DIAGNOSIS — C182 Malignant neoplasm of ascending colon: Secondary | ICD-10-CM | POA: Insufficient documentation

## 2021-02-02 DIAGNOSIS — E278 Other specified disorders of adrenal gland: Secondary | ICD-10-CM | POA: Diagnosis not present

## 2021-02-02 DIAGNOSIS — Z7901 Long term (current) use of anticoagulants: Secondary | ICD-10-CM | POA: Diagnosis not present

## 2021-02-02 DIAGNOSIS — Z85118 Personal history of other malignant neoplasm of bronchus and lung: Secondary | ICD-10-CM | POA: Diagnosis not present

## 2021-02-02 DIAGNOSIS — C3491 Malignant neoplasm of unspecified part of right bronchus or lung: Secondary | ICD-10-CM

## 2021-02-02 DIAGNOSIS — Z951 Presence of aortocoronary bypass graft: Secondary | ICD-10-CM | POA: Insufficient documentation

## 2021-02-02 DIAGNOSIS — Z85038 Personal history of other malignant neoplasm of large intestine: Secondary | ICD-10-CM | POA: Diagnosis not present

## 2021-02-02 DIAGNOSIS — I2510 Atherosclerotic heart disease of native coronary artery without angina pectoris: Secondary | ICD-10-CM | POA: Insufficient documentation

## 2021-02-02 DIAGNOSIS — M4696 Unspecified inflammatory spondylopathy, lumbar region: Secondary | ICD-10-CM | POA: Diagnosis not present

## 2021-02-02 DIAGNOSIS — C3490 Malignant neoplasm of unspecified part of unspecified bronchus or lung: Secondary | ICD-10-CM | POA: Diagnosis not present

## 2021-02-02 DIAGNOSIS — J8410 Pulmonary fibrosis, unspecified: Secondary | ICD-10-CM | POA: Diagnosis not present

## 2021-02-02 DIAGNOSIS — D7389 Other diseases of spleen: Secondary | ICD-10-CM | POA: Diagnosis not present

## 2021-02-02 HISTORY — DX: Heart failure, unspecified: I50.9

## 2021-02-02 LAB — CBC WITH DIFFERENTIAL (CANCER CENTER ONLY)
Abs Immature Granulocytes: 0.02 10*3/uL (ref 0.00–0.07)
Basophils Absolute: 0 10*3/uL (ref 0.0–0.1)
Basophils Relative: 0 %
Eosinophils Absolute: 0.1 10*3/uL (ref 0.0–0.5)
Eosinophils Relative: 1 %
HCT: 45 % (ref 39.0–52.0)
Hemoglobin: 15.2 g/dL (ref 13.0–17.0)
Immature Granulocytes: 0 %
Lymphocytes Relative: 26 %
Lymphs Abs: 1.3 10*3/uL (ref 0.7–4.0)
MCH: 29.7 pg (ref 26.0–34.0)
MCHC: 33.8 g/dL (ref 30.0–36.0)
MCV: 87.9 fL (ref 80.0–100.0)
Monocytes Absolute: 0.7 10*3/uL (ref 0.1–1.0)
Monocytes Relative: 15 %
Neutro Abs: 2.8 10*3/uL (ref 1.7–7.7)
Neutrophils Relative %: 58 %
Platelet Count: 148 10*3/uL — ABNORMAL LOW (ref 150–400)
RBC: 5.12 MIL/uL (ref 4.22–5.81)
RDW: 12.8 % (ref 11.5–15.5)
WBC Count: 4.9 10*3/uL (ref 4.0–10.5)
nRBC: 0 % (ref 0.0–0.2)

## 2021-02-02 LAB — CMP (CANCER CENTER ONLY)
ALT: 13 U/L (ref 0–44)
AST: 17 U/L (ref 15–41)
Albumin: 4.1 g/dL (ref 3.5–5.0)
Alkaline Phosphatase: 65 U/L (ref 38–126)
Anion gap: 12 (ref 5–15)
BUN: 24 mg/dL — ABNORMAL HIGH (ref 8–23)
CO2: 25 mmol/L (ref 22–32)
Calcium: 9.6 mg/dL (ref 8.9–10.3)
Chloride: 103 mmol/L (ref 98–111)
Creatinine: 1.03 mg/dL (ref 0.61–1.24)
GFR, Estimated: >60 mL/min (ref >60)
Glucose, Bld: 98 mg/dL (ref 70–99)
Potassium: 3.7 mmol/L (ref 3.5–5.1)
Sodium: 140 mmol/L (ref 135–145)
Total Bilirubin: 0.7 mg/dL (ref 0.3–1.2)
Total Protein: 6.9 g/dL (ref 6.5–8.1)

## 2021-02-02 LAB — CEA (IN HOUSE-CHCC): CEA (CHCC-In House): 2.44 ng/mL (ref 0.00–5.00)

## 2021-02-02 MED ORDER — IOHEXOL 300 MG/ML  SOLN
100.0000 mL | Freq: Once | INTRAMUSCULAR | Status: AC | PRN
  Administered 2021-02-02: 100 mL via INTRAVENOUS

## 2021-02-04 ENCOUNTER — Other Ambulatory Visit: Payer: Self-pay

## 2021-02-04 ENCOUNTER — Telehealth: Payer: Self-pay | Admitting: Hematology

## 2021-02-04 ENCOUNTER — Inpatient Hospital Stay: Payer: Medicare HMO | Admitting: Hematology

## 2021-02-04 ENCOUNTER — Encounter: Payer: Self-pay | Admitting: Hematology

## 2021-02-04 VITALS — BP 145/84 | HR 91 | Temp 97.0°F | Resp 20 | Ht 71.0 in | Wt 231.7 lb

## 2021-02-04 DIAGNOSIS — C182 Malignant neoplasm of ascending colon: Secondary | ICD-10-CM | POA: Diagnosis not present

## 2021-02-04 NOTE — Telephone Encounter (Signed)
Scheduled follow-up appointment per 4/27 los. Patient is aware.

## 2021-02-18 ENCOUNTER — Ambulatory Visit
Admission: EM | Admit: 2021-02-18 | Discharge: 2021-02-18 | Disposition: A | Discharge status: Home / Self Care | Payer: Medicare HMO | Attending: Family Medicine | Admitting: Family Medicine

## 2021-02-18 ENCOUNTER — Other Ambulatory Visit: Payer: Self-pay

## 2021-02-18 DIAGNOSIS — I509 Heart failure, unspecified: Secondary | ICD-10-CM | POA: Diagnosis not present

## 2021-02-18 DIAGNOSIS — L02219 Cutaneous abscess of trunk, unspecified: Secondary | ICD-10-CM | POA: Diagnosis not present

## 2021-02-18 DIAGNOSIS — L03319 Cellulitis of trunk, unspecified: Secondary | ICD-10-CM | POA: Diagnosis not present

## 2021-02-18 MED ORDER — SULFAMETHOXAZOLE-TRIMETHOPRIM 800-160 MG PO TABS: 1.0000 | ORAL_TABLET | Freq: Two times a day (BID) | ORAL | 0 refills | Status: AC

## 2021-02-18 NOTE — ED Provider Notes (Signed)
[facility]    CSN: [id number] Arrival date & time: 02/18/21  1548      History   Chief Complaint Chief Complaint  Patient presents with  . Abscess    HPI Vincent Mcclure is a 87 y.o. male.   HPI  Patient present with a indurated lesion on the abdomen x few days. He suspects being bitten by insect. Today he noticed the lesion with warm and increased in redness. No fever, nausea, or vomiting. Past Medical History:  Diagnosis Date  . Alcohol abuse   . Anemia   . Arthritis    hands  . Blood transfusion without reported diagnosis   . CAD (coronary artery disease)    a. NSTEMI 2018 s/p CABG.  . CHF (congestive heart failure) ([place])   . Chronic low back pain 10/14/2011  . colon ca dx'd 05/18/18   colon cancer  . Colon polyps    hyperplastic (2004, 2010) and adenomatous (1990).    . COLONIC POLYPS, HX OF 03/05/2008  . Esophageal stricture    hx of  . Gastric ulcer   . GERD 03/05/2008  . GERD (gastroesophageal reflux disease) 1994   associated peptic strictures  . HYPERLIPIDEMIA 03/05/2008  . HYPERTENSION 03/05/2008  . Hypertension   . IBS 03/05/2008  . Impaired glucose tolerance 10/12/2011  . Iron deficiency anemia   . LEG CRAMPS 09/02/2010  . Lung cancer ([place]) dx'd 06/2020  . Myocardial infarction [facility])    May 2018  . PAD (peripheral artery disease) ([place]) 2013  . PAF (paroxysmal atrial fibrillation) ([place])   . PERIPHERAL EDEMA 09/02/2010  . PERIPHERAL NEUROPATHY 03/05/2008  . Premature atrial contraction   . Prostatitis    hx of  . PVC's (premature ventricular contractions)   . Sinus brady-tachy syndrome ([place])   . VARICOSE VEINS, LOWER EXTREMITIES 06/09/2009    Patient Active Problem List   Diagnosis Date Noted  . 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 Malignant neoplasm of bronchus of right lower lobe ([place]) 07/06/2020  . r/o Colitis, ischemic ([place]) 07/19/2018  . Recent Abscess of abdominal wall 07/19/2018  . Cellulitis of left abdominal wall  07/05/2018  . Intraabdominal fluid collection 07/05/2018  . Peripheral edema   . Post-operative state   . Rupture of operation wound   . General weakness   . SOB (shortness of breath)   . CHF (congestive heart failure) ([place]) 06/06/2018  . Subacute confusional state 06/05/2018  . Hypoalbuminemia 06/03/2018  . Chronic diastolic CHF (congestive heart failure) ([place]) 06/02/2018  . Hypokalemia 06/02/2018  . Atrial fibrillation with RVR ([place]) 06/02/2018  . Cancer of ascending colon [id number] s/p right colectomy 05/19/2018 05/24/2018  . CAD (coronary artery disease), native coronary artery 03/14/2017  . Paroxysmal atrial fibrillation ([name])   . 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 s/p NSTEMI (non-ST elevated myocardial infarction); with subsequent urgent CABG  5/18 ([place]) 02/12/2017  . Chest pain 02/11/2017  . Morbid obesity ([place]) 09/29/2016  . Alcohol use/ h/o heavy abuse 08/11/2016  . Left shoulder pain 01/24/2015  . h/o Anemia due to blood loss, acute 11/14/2014  . Eye pain   . Acute esophagitis   . Acute gastric ulcer   . Duodenal ulcer disease   . Upper GI bleed 11/13/2014  . Lumbar disc disease 01/30/2014  . Hearing loss 03/23/2013  . Screening for other and unspecified cardiovascular conditions 03/23/2013  . PAD (peripheral artery disease) ([place]) 10/14/2011  . Chronic low back pain 10/14/2011  . Glucose intolerance (  impaired glucose tolerance) 10/12/2011  . Preventative health care 10/12/2011  . LEG CRAMPS 09/02/2010  . PERIPHERAL EDEMA 09/02/2010  . VARICOSE VEINS, LOWER EXTREMITIES 06/09/2009  . HLD (hyperlipidemia) 03/05/2008  . Anemia, iron deficiency 03/05/2008  . Hereditary and idiopathic peripheral neuropathy 03/05/2008  . HTN (hypertension) 03/05/2008  . GERD 03/05/2008  . IBS 03/05/2008  . Blood in stool 03/05/2008  . COLONIC POLYPS, HX OF 03/05/2008    Past Surgical History:  Procedure Laterality Date  . CATARACT EXTRACTION  bilat  . COLECTOMY    . COLONOSCOPY    .  CORONARY ARTERY BYPASS GRAFT N/A 02/15/2017   Procedure: CORONARY ARTERY BYPASS GRAFTING times three  with left internal mammary harvest and endoscopic harvest of Right SVG. Grafts of LIMA to  LAD, SVG to Distal Circ, and to First Diag.;  Surgeon: Grace Isaac, MD;  Location: [facility];  Service: Open Heart Surgery;  Laterality: N/A;  . ESOPHAGOGASTRODUODENOSCOPY N/A 11/14/2014   Procedure: ESOPHAGOGASTRODUODENOSCOPY (EGD);  Surgeon: Jerene Bears, MD;  Location[facility] ENDOSCOPY;  Service: Endoscopy;  Laterality: N/A;  . IR RADIOLOGIST EVAL & MGMT  07/04/2018  . IR RADIOLOGIST EVAL & MGMT  07/18/2018  . LAPAROTOMY N/A 05/19/2018   Procedure: EXPLORATORY LAPAROTOMY RIGHT COLECTOMY;  Surgeon: Fanny Skates, MD;  Location: [facility];  Service: General;  Laterality: N/A;  . LEFT HEART CATH AND CORONARY ANGIOGRAPHY N/A 02/14/2017   Procedure: Left Heart Cath and Coronary Angiography;  Surgeon: Belva Crome, MD;  Location: [facility];  Service: Cardiovascular;  Laterality: N/A;  . LUMBAR SPINE SURGERY  11/2008   Dr Joya Salm  . TEE WITHOUT CARDIOVERSION N/A 02/15/2017   Procedure: TRANSESOPHAGEAL ECHOCARDIOGRAM (TEE);  Surgeon: Grace Isaac, MD;  Location: [place];  Service: Open Heart Surgery;  Laterality: N/A;  . UPPER GASTROINTESTINAL ENDOSCOPY         Home Medications    Prior to Admission medications   Medication Sig Start Date End Date Taking? Authorizing Provider  acetaminophen (TYLENOL) 500 MG tablet Take 1 tablet (500 mg total) by mouth every 6 (six) hours as needed for mild pain. 05/30/18   Norm Parcel, PA-C  apixaban (ELIQUIS) 5 MG TABS tablet Take 1 tablet (5 mg total) by mouth 2 (two) times daily. 11/19/20   Dorothy Spark, MD  bacitracin 500 UNIT/GM ointment Apply 1 application topically daily as needed for wound care.    [provider]  Cholecalciferol (VITAMIN D-3 PO) Take 2 capsules by mouth daily.     [provider]  hydrochlorothiazide (HYDRODIURIL) 25 MG  tablet Take 1 tablet by mouth once daily 12/09/20   Dorothy Spark, MD  metoprolol succinate (TOPROL-XL) 50 MG 24 hr tablet Take 1 tablet (50 mg total) by mouth daily. Take with or immediately following a meal. 12/02/20 03/02/21  Evans Lance, MD  nitroGLYCERIN (NITROSTAT) 0.4 MG SL tablet Place 1 tablet (0.4 mg total) under the tongue every 5 (five) minutes as needed for chest pain (or tightness). 03/19/20   Dorothy Spark, MD  pravastatin (PRAVACHOL) 40 MG tablet Take 1 tablet (40 mg total) by mouth at bedtime. 01/13/21   Charlie Pitter, PA-C    Family History Family History  Problem Relation Age of Onset  . Cancer Mother        Brain Cancer  . Diabetes Father   . Heart disease Father        CAD  . Hypertension Father   . Stroke  Father   . Diabetes Sister   . Stomach cancer Neg Hx   . Pancreatic cancer Neg Hx   . Colon cancer Neg Hx   . Esophageal cancer Neg Hx   . Rectal cancer Neg Hx     Social History Social History   Tobacco Use  . Smoking status: Former Smoker    Packs/day: 1.25    Years: 50.00    Pack years: 62.50    Types: Cigarettes    Quit date: 11/11/2008    Years since quitting: 12.2  . Smokeless tobacco: Former Systems developer    Types: Chew  . Tobacco comment: smoked off and on for years  Vaping Use  . Vaping Use: Never used  Substance Use Topics  . Alcohol use: Yes    Alcohol/week: 6.0 standard drinks    Types: 2 Glasses of wine, 4 Cans of beer per week    Comment: vodka  . Drug use: No     Allergies   Patient has no known allergies.   Review of Systems Review of Systems Pertinent negatives listed in HPI  Physical Exam Triage Vital Signs ED Triage Vitals [02/18/21 1746]  Enc Vitals Group     BP 127/86     Pulse Rate 90     Resp 18     Temp 98 F (36.7 C)     Temp Source Oral     SpO2 93 %     Weight      Height      Head Circumference      Peak Flow      Pain Score 0     Pain Loc      Pain Edu?      Excl. in [place]?    No data  found.  Updated Vital Signs BP 127/86 (BP Location: Left Arm)   Pulse 90   Temp 98 F (36.7 C) (Oral)   Resp 18   SpO2 93%   Visual Acuity Right Eye Distance:   Left Eye Distance:   Bilateral Distance:    Right Eye Near:   Left Eye Near:    Bilateral Near:     Physical Exam  General appearance: Alert, cooperative, no distress Head: Normocephalic, without obvious abnormality, atraumatic Respiratory: Respirations even and unlabored, normal respiratory rate Heart: Rate and rhythm normal. No gallop or murmurs noted on exam  Abdomen: BS +, no distention, abscess 5 mm indurated lesion, with erythema   Extremities: No gross deformities Skin: Skin color, texture, turgor normal. No rashes seen  Psych: Appropriate mood and affect. Neurologic:GCS 15, normal coordination, normal gait  UC Treatments / Results  Labs (all labs ordered are listed, but only abnormal results are displayed) Labs Reviewed - No data to display  EKG   Radiology No results found.  Procedures Procedures (including critical care time)  Medications Ordered in UC Medications - No data to display  Initial Impression / Assessment and Plan / UC Course  I have reviewed the triage vital signs and the nursing notes.  Pertinent labs & imaging results that were available during my care of the patient were reviewed by me and considered in my medical decision making (see chart for details).    Cellulitis and abscess lesion of trunk. I&D not warranted at present. Trial antibiotic therapy. Return precaution given if symptoms worsen or do not improve. Final Clinical Impressions(s) / UC Diagnoses   Final diagnoses:  Cellulitis and abscess of trunk   Discharge Instructions   None  ED Prescriptions    Medication Sig Dispense Auth. Provider   sulfamethoxazole-trimethoprim (BACTRIM DS) 800-160 MG tablet Take 1 tablet by mouth 2 (two) times daily for 10 days. 20 tablet Scot Jun, FNP     PDMP not  reviewed this encounter.   Scot Jun, [place] 02/24/21 [phone]

## 2021-02-18 NOTE — ED Triage Notes (Signed)
Pt c/o abscess to rt side of back for over 3wks, now is red with a black center.

## 2021-02-24 DIAGNOSIS — H35373 Puckering of macula, bilateral: Secondary | ICD-10-CM | POA: Diagnosis not present

## 2021-02-24 DIAGNOSIS — H43822 Vitreomacular adhesion, left eye: Secondary | ICD-10-CM | POA: Diagnosis not present

## 2021-02-24 DIAGNOSIS — H353132 Nonexudative age-related macular degeneration, bilateral, intermediate dry stage: Secondary | ICD-10-CM | POA: Diagnosis not present

## 2021-02-24 DIAGNOSIS — H35033 Hypertensive retinopathy, bilateral: Secondary | ICD-10-CM | POA: Diagnosis not present

## 2021-03-18 DIAGNOSIS — C44311 Basal cell carcinoma of skin of nose: Secondary | ICD-10-CM | POA: Diagnosis not present

## 2021-03-18 DIAGNOSIS — C44611 Basal cell carcinoma of skin of unspecified upper limb, including shoulder: Secondary | ICD-10-CM | POA: Diagnosis not present

## 2021-04-01 ENCOUNTER — Ambulatory Visit: Payer: Medicare HMO | Admitting: Internal Medicine

## 2021-04-06 DIAGNOSIS — C44311 Basal cell carcinoma of skin of nose: Secondary | ICD-10-CM | POA: Diagnosis not present

## 2021-04-08 ENCOUNTER — Telehealth: Payer: Self-pay | Admitting: Cardiology

## 2021-04-08 MED ORDER — METOPROLOL SUCCINATE ER 50 MG PO TB24: 50.0000 mg | ORAL_TABLET | Freq: Every day | ORAL | 0 refills | Status: DC

## 2021-04-08 NOTE — Telephone Encounter (Signed)
Pt's medication was sent to pt's pharmacy as requested. Confirmation received.  °

## 2021-04-08 NOTE — Telephone Encounter (Signed)
*  STAT* If patient is at the pharmacy, call can be transferred to refill team.   1. Which medications need to be refilled? (please list name of each medication and dose if known) Metoprolol 50 gm   2. Which pharmacy/location (including street and city if local pharmacy) is medication to be sent to? [facility]   3. Do they need a 30 day or 90 day supply? 30  Patient is out and on the way to [facility]

## 2021-04-17 NOTE — Progress Notes (Deleted)
Cardiology Office Note:    Date:  04/17/2021   ID:  AKSHAY SPANG, DOB 1932/03/07, MRN [id number]  PCP:  [facility], [place] Providers Cardiologist:  None Cardiology APP:  Imogene Burn, PA-C  Electrophysiologist:  Cristopher Peru, MD {  Referring MD: No ref. provider found    History of Present Illness:    Vincent Mcclure is a 87 y.o. male with a hx of CAD (NSTEMI 2018 s/p CABGx3), PAF, PACs, sinus bradycardia, HTN, HLD (followed by PCP), gastric ulcers with GIB 2016 2/2 ETOH, tobacco abuse, colon CA s/p surgery, NSCLC (radiation) who was previously followed by Dr. Meda Coffee who now presents to clinic for follow-up.  Per review of the record, the patient underwent nuclear stress test in in 06/2020 for chest pain that was negative for ischemia. He takes sporadic SL NTG for angina. He was started on isosorbide 15mg  daily at that time but eventually opted not to take it. In general he is wary about starting new medicines. In 10/2020 he saw Dr. Meda Coffee for an episode of syncope that had happened 2 months prior. HR was in the low 50s so Toprol was reduced and he was restarted on baby ASA (he had stopped this after reading about it on the internet). Event monitor showed predominantly NSR but 18% PAF burden and frequent PACs, rare PVCs. Average HR overall 93bpm, minimum 61, max 183. Aspirin was stopped and he was started on Eliquis. He saw Dr. Lovena Le in 11/2020 for potential tachy-brady syndrome. He was not felt to require any intervention beyond BB/Eliquis at this time. Dr. Lovena Le increased Toprol back to 50mg  daily, but it has since been decreased to 25mg  daily.  Last saw Melina Copa in clinic on 01/13/21 where he was worried about side effects of apixaban. Otherwise no anginal symptoms.  Today,  Past Medical History:  Diagnosis Date   Alcohol abuse    Anemia    Arthritis    hands   Blood transfusion without reported diagnosis    CAD (coronary artery  disease)    a. NSTEMI 2018 s/p CABG.   CHF (congestive heart failure) ([name])    Chronic low back pain 10/14/2011   colon ca dx'd 05/18/18   colon cancer   Colon polyps    hyperplastic (2004, 2010) and adenomatous (1990).     COLONIC POLYPS, HX OF 03/05/2008   Esophageal stricture    hx of   Gastric ulcer    GERD 03/05/2008   GERD (gastroesophageal reflux disease) 1994   associated peptic strictures   HYPERLIPIDEMIA 03/05/2008   HYPERTENSION 03/05/2008   Hypertension    IBS 03/05/2008   Impaired glucose tolerance 10/12/2011   Iron deficiency anemia    LEG CRAMPS 09/02/2010   Lung cancer ([place]) dx'd 06/2020   Myocardial infarction [facility])    May 2018   PAD (peripheral artery disease) ([place]) 2013   PAF (paroxysmal atrial fibrillation) ([place])    PERIPHERAL EDEMA 09/02/2010   PERIPHERAL NEUROPATHY 03/05/2008   Premature atrial contraction    Prostatitis    hx of   PVC's (premature ventricular contractions)    Sinus brady-tachy syndrome ([place])    VARICOSE VEINS, LOWER EXTREMITIES 06/09/2009    Past Surgical History:  Procedure Laterality Date   CATARACT EXTRACTION  bilat   COLECTOMY     COLONOSCOPY     CORONARY ARTERY BYPASS GRAFT N/A 02/15/2017   Procedure: CORONARY ARTERY BYPASS GRAFTING times three  with left internal  mammary harvest and endoscopic harvest of Right SVG. Grafts of LIMA to  LAD, SVG to Distal Circ, and to First Diag.;  Surgeon: Grace Isaac, MD;  Location: [place];  Service: Open Heart Surgery;  Laterality: N/A;   ESOPHAGOGASTRODUODENOSCOPY N/A 11/14/2014   Procedure: ESOPHAGOGASTRODUODENOSCOPY (EGD);  Surgeon: Jerene Bears, MD;  Location: [facility] ENDOSCOPY;  Service: Endoscopy;  Laterality: N/A;   IR RADIOLOGIST EVAL & MGMT  07/04/2018   IR RADIOLOGIST EVAL & MGMT  07/18/2018   LAPAROTOMY N/A 05/19/2018   Procedure: EXPLORATORY LAPAROTOMY RIGHT COLECTOMY;  Surgeon: Fanny Skates, MD;  [facility];  Service: General;  Laterality: N/A;   LEFT HEART CATH AND CORONARY ANGIOGRAPHY  N/A 02/14/2017   Procedure: Left Heart Cath and Coronary Angiography;  Surgeon: Belva Crome, MD;  Location: [place] [facility];  Service: Cardiovascular;  Laterality: N/A;   LUMBAR SPINE SURGERY  11/2008   Dr Joya Salm   TEE WITHOUT CARDIOVERSION N/A 02/15/2017   Procedure: TRANSESOPHAGEAL ECHOCARDIOGRAM (TEE);  Surgeon: Grace Isaac, MD;  Location: [place];  Service: Open Heart Surgery;  Laterality: N/A;   UPPER GASTROINTESTINAL ENDOSCOPY      Current Medications: No outpatient medications have been marked as taking for the 04/22/21 encounter (Appointment) with Freada Bergeron, MD.     Allergies:   Patient has no known allergies.   Social History   Socioeconomic History   Marital status: Married    Spouse name: Not on file   Number of children: 4   Years of education: Not on file   Highest education level: Not on file  Occupational History   Occupation: Retired Engineer, maintenance  Tobacco Use   Smoking status: Former    Packs/day: 1.25    Years: 50.00    Pack years: 62.50    Types: Cigarettes    Quit date: 11/11/2008    Years since quitting: 12.4   Smokeless tobacco: Former    Types: Chew   Tobacco comments:    smoked off and on for years  Vaping Use   Vaping Use: Never used  Substance and Sexual Activity   Alcohol use: Yes    Alcohol/week: 6.0 standard drinks    Types: 2 Glasses of wine, 4 Cans of beer per week    Comment: vodka   Drug use: No   Sexual activity: Never  Other Topics Concern   Not on file  Social History Narrative   ** Merged History Encounter **       4 sons   Social Determinants of Health   Financial Resource Strain: Not on file  Food Insecurity: Not on file  Transportation Needs: Not on file  Physical Activity: Not on file  Stress: Not on file  Social Connections: Not on file     Family History: The patient's ***family history includes Cancer in his mother; Diabetes in his father and sister; Heart disease in his father; Hypertension in  his father; Stroke in his father. There is no history of Stomach cancer, Pancreatic cancer, Colon cancer, Esophageal cancer, or Rectal cancer.  ROS:   Please see the history of present illness.    *** All other systems reviewed and are negative.  EKGs/Labs/Other Studies Reviewed:    The following studies were reviewed today: 2D Echo 05/2018 - Left ventricle: The cavity size was normal. Wall thickness was    increased in a pattern of mild LVH. Systolic function was normal.    The estimated ejection fraction was in the  range of 55% to 60%.    Wall motion was normal; there were no regional wall motion    abnormalities. Doppler parameters are consistent with abnormal    left ventricular relaxation (grade 1 diastolic dysfunction).  - Aortic valve: Mildly calcified annulus. Trileaflet; mildly    thickened leaflets. Valve area (VTI): 4.6 cm^2. Valve area    (Vmax): 4.22 cm^2.  - Right atrium: There is a 1.2 by 1.6 cm echodense mass in the    right atrium, overall poorly visualized but best seen in the    apical 4 chamber views. The subcostal views are limited . When    reviewing the 02/15/17 TEE this area is also poorly visualized, but    does appear to be due to a prominent eustachian valve, a benign    finding.  - Pulmonary arteries: Systolic pressure was mildly increased. PA    peak pressure: 37 mm Hg (S).  - Systemic veins: IVC is small suggesting low RA pressure and    hypovolemia.   Cardiac Cath 02/2017 Heavily calcified left main and proximal to mid LAD. Eccentric 75% distal left main Mid LAD [place] 0, 1, 1 bifurcation stenosis with 90% LAD and 75% second diagonal. Subtotal occlusion, 99%, of the moderate sized first diagonal, possibly the source of the patient's presentation. Ostial 80% circumflex. The distal circumflex is relatively small with 2 small to intermediate size branches distally. Mid to distal 70% PDA. The right coronary is dominant and otherwise widely patent. Mild  hypokinesis mid anterior wall, likely due to occlusion of the first diagonal. Overall LV function is normal with EF 55%. Normal left ventricular filling pressures.   Recommendations:   Surgical consultation via TCTS for consideration of bypass surgery due to left main coronary disease and complex mid LAD stenoses.   Monitor 11/2020 Patient had a min HR of 61 bpm, max HR of 183 bpm, and avg HR of 93 bpm. Predominant underlying rhythm was Sinus Rhythm. Atrial Fibrillation occurred (18% burden), ranging from 67-183 bpm (avg of 113 bpm), the longest lasting 4 hours 4 mins with an avg rate of 111 bpm. Isolated PACs were frequent (12.3%, [id number]), PAC Couplets and triplets were frequent (5.0% and 4.9%). Isolated PVCs were rare (<1.0%), PVC Couplets were rare (<1.0%, 72).   Predominant underlying rhythm was Sinus Rhythm. Atrial Fibrillation occurred (18% burden), ranging from 67-183 bpm (avg of 113 bpm), the longest lasting 4 hours 4 mins with an avg rate of 111 bpm. Frequent PACs.       EKG:  EKG is *** ordered today.  The ekg ordered today demonstrates ***  Recent Labs: 01/13/2021: Magnesium 1.9; TSH 0.886 02/02/2021: ALT 13; BUN 24; Creatinine 1.03; Hemoglobin 15.2; Platelet Count 148; Potassium 3.7; Sodium 140  Recent Lipid Panel    Component Value Date/Time   CHOL 128 01/13/2021 1019   TRIG 150 (H) 01/13/2021 1019   HDL 48 01/13/2021 1019   CHOLHDL 2.7 01/13/2021 1019   CHOLHDL 3.4 02/12/2017 0657   VLDL 17 02/12/2017 0657   LDLCALC 54 01/13/2021 1019   LDLDIRECT 64 04/24/2019 0917   LDLDIRECT 133.2 10/14/2011 1106     Risk Assessment/Calculations:   {Does this patient have ATRIAL FIBRILLATION?:[phone]}       Physical Exam:    VS:  There were no vitals taken for this visit.    Wt Readings from Last 3 Encounters:  02/04/21 231 lb 11.2 oz (105.1 kg)  01/13/21 231 lb (104.8 kg)  12/02/20 234 lb 9.6 oz (  106.4 kg)     GEN: *** Well nourished, well developed in no acute  distress HEENT: Normal NECK: No JVD; No carotid bruits LYMPHATICS: No lymphadenopathy CARDIAC: ***RRR, no murmurs, rubs, gallops RESPIRATORY:  Clear to auscultation without rales, wheezing or rhonchi  ABDOMEN: Soft, non-tender, non-distended MUSCULOSKELETAL:  No edema; No deformity  SKIN: Warm and dry NEUROLOGIC:  Alert and oriented x 3 PSYCHIATRIC:  Normal affect   ASSESSMENT:    No diagnosis found. PLAN:    In order of problems listed above:  #CAD s/p CABG: No anginal symptoms. Doing well.  -Continue metop 25mg  XL daily -Continue pravastatin 40mg  daily -Nitro prn  #Paroxymsal Afib: -Continue apixaban 5mg  BID -Continue metop 50mg  XL daily  #HTN: -Continue metop 50mg  XL daily -Continue HCTZ 25mg  daily  #HLD: LDL well controlled and at goal LDL 54. -Continue pravastatin 40mg  daily  {Are you ordering a CV Procedure (e.g. stress test, cath, DCCV, TEE, etc)?   Press F2        :[id number]}    Medication Adjustments/Labs and Tests Ordered: Current medicines are reviewed at length with the patient today.  Concerns regarding medicines are outlined above.  No orders of the defined types were placed in this encounter.  No orders of the defined types were placed in this encounter.   There are no Patient Instructions on file for this visit.   Signed, Freada Bergeron, MD  04/17/2021 3:53 PM    [place]

## 2021-04-22 ENCOUNTER — Other Ambulatory Visit: Payer: Self-pay

## 2021-04-22 ENCOUNTER — Encounter: Payer: Self-pay | Admitting: Cardiology

## 2021-04-22 ENCOUNTER — Ambulatory Visit: Payer: Medicare HMO | Admitting: Cardiology

## 2021-04-22 VITALS — BP 132/82 | HR 74 | Ht 71.0 in | Wt 226.8 lb

## 2021-04-22 DIAGNOSIS — I5189 Other ill-defined heart diseases: Secondary | ICD-10-CM

## 2021-04-22 DIAGNOSIS — E782 Mixed hyperlipidemia: Secondary | ICD-10-CM | POA: Diagnosis not present

## 2021-04-22 DIAGNOSIS — I25810 Atherosclerosis of coronary artery bypass graft(s) without angina pectoris: Secondary | ICD-10-CM | POA: Diagnosis not present

## 2021-04-22 DIAGNOSIS — I10 Essential (primary) hypertension: Secondary | ICD-10-CM

## 2021-04-22 DIAGNOSIS — I2510 Atherosclerotic heart disease of native coronary artery without angina pectoris: Secondary | ICD-10-CM | POA: Diagnosis not present

## 2021-04-22 DIAGNOSIS — E785 Hyperlipidemia, unspecified: Secondary | ICD-10-CM | POA: Diagnosis not present

## 2021-04-22 DIAGNOSIS — Z951 Presence of aortocoronary bypass graft: Secondary | ICD-10-CM | POA: Diagnosis not present

## 2021-04-22 MED ORDER — METOPROLOL SUCCINATE ER 25 MG PO TB24: 25.0000 mg | ORAL_TABLET | Freq: Every day | ORAL | 1 refills | Status: DC

## 2021-04-22 NOTE — Patient Instructions (Signed)
Medication Instructions:   TAKE METOPROLOL SUCCINATE 25 MG BY MOUTH DAILY  *If you need a refill on your cardiac medications before your next appointment, please call your pharmacy*   Testing/Procedures:  Your physician has requested that you have an echocardiogram. Echocardiography is a painless test that uses sound waves to create images of your heart. It provides your doctor with information about the size and shape of your heart and how well your heart's chambers and valves are working. This procedure takes approximately one hour. There are no restrictions for this procedure.  Follow-Up: At [facility], you and your health needs are our priority.  As part of our continuing mission to provide you with exceptional heart care, we have created designated Provider Care Teams.  These Care Teams include your primary Cardiologist (physician) and Advanced Practice Providers (APPs -  Physician Assistants and Nurse Practitioners) who all work together to provide you with the care you need, when you need it.  We recommend signing up for the patient portal called "MyChart".  Sign up information is provided on this After Visit Summary.  MyChart is used to connect with patients for Virtual Visits (Telemedicine).  Patients are able to view lab/test results, encounter notes, upcoming appointments, etc.  Non-urgent messages can be sent to your provider as well.   To learn more about what you can do with MyChart, go to [email or web address].    Your next appointment:   6 month(s)  The format for your next appointment:   In Person  Provider:   Gwyndolyn Kaufman, MD

## 2021-04-22 NOTE — Progress Notes (Signed)
Cardiology Office Note:    Date:  04/22/2021   ID:  Vincent Mcclure, DOB Mar 24, 1932, MRN [id number]  PCP:  [facility] [place] Providers Cardiologist:  None Cardiology APP:  Imogene Burn, PA-C  Electrophysiologist:  Cristopher Peru, MD  {  Referring MD: No ref. provider found    History of Present Illness:    Vincent Mcclure is a 87 y.o. male with a hx of CAD (NSTEMI 2018 s/p CABGx3), PAF, PACs, sinus bradycardia, HTN, HLD (followed by PCP), gastric ulcers with GIB 2016 2/2 ETOH, tobacco abuse, colon CA s/p surgery, NSCLC (radiation) who was previously followed by Dr. Meda Coffee who now presents to clinic for follow-up.  Per review of the record, the patient underwent nuclear stress test in in 06/2020 for chest pain that was negative for ischemia. He takes sporadic SL NTG for angina. He was started on isosorbide 15mg  daily at that time but eventually opted not to take it. In general he is wary about starting new medicines. In 10/2020 he saw Dr. Meda Coffee for an episode of syncope that had happened 2 months prior. HR was in the low 50s so Toprol was reduced and he was restarted on baby ASA (he had stopped this after reading about it on the internet). Event monitor showed predominantly NSR but 18% PAF burden and frequent PACs, rare PVCs. Average HR overall 93bpm, minimum 61, max 183. Aspirin was stopped and he was started on Eliquis. He saw Dr. Lovena Le in 11/2020 for potential tachy-brady syndrome. He was not felt to require any intervention beyond BB/Eliquis at this time. Dr. Lovena Le increased Toprol back to 50mg  daily, however, the patient chose to go back on 25mg  XL.  Last saw Melina Copa in clinic on 01/13/21 where he was worried about side effects of apixaban. Otherwise no anginal symptoms.  Today, patient says he is feeling okay. He occassionally has shortness of breath which resolves with sitting and resting. Has used nitro once about 4 months ago, but no  recent chest pain. He is not very active due to underlying joint pain/neuropathy. Has chronic R>L LE edema which is usually worse at the end of the day. He attributes this to the bypass graft that was taking. He states he would not like additional medications at this time.  Of note, review of TTE in 2019 showed a possible echodense mass measuring 1.2 by 1.6cm. Possibly eustachian valve. Patient amenable for repeat study to ensure stable.   Patient denies chest pain abdominal pain, diarrhea, palpitations, nausea, headaches, lightheadedness, syncope,orthopnea or PND.   Past Medical History:  Diagnosis Date   Alcohol abuse    Anemia    Arthritis    hands   Blood transfusion without reported diagnosis    CAD (coronary artery disease)    a. NSTEMI 2018 s/p CABG.   CHF (congestive heart failure) ([name])    Chronic low back pain 10/14/2011   colon ca dx'd 05/18/18   colon cancer   Colon polyps    hyperplastic (2004, 2010) and adenomatous (1990).     COLONIC POLYPS, HX OF 03/05/2008   Esophageal stricture    hx of   Gastric ulcer    GERD 03/05/2008   GERD (gastroesophageal reflux disease) 1994   associated peptic strictures   HYPERLIPIDEMIA 03/05/2008   HYPERTENSION 03/05/2008   Hypertension    IBS 03/05/2008   Impaired glucose tolerance 10/12/2011   Iron deficiency anemia    LEG CRAMPS 09/02/2010  Lung cancer ([place]) dx'd 06/2020   Myocardial infarction [facility])    May 2018   PAD (peripheral artery disease) ([place]) 2013   PAF (paroxysmal atrial fibrillation) ([place])    PERIPHERAL EDEMA 09/02/2010   PERIPHERAL NEUROPATHY 03/05/2008   Premature atrial contraction    Prostatitis    hx of   PVC's (premature ventricular contractions)    Sinus brady-tachy syndrome ([place])    VARICOSE VEINS, LOWER EXTREMITIES 06/09/2009    Past Surgical History:  Procedure Laterality Date   CATARACT EXTRACTION  bilat   COLECTOMY     COLONOSCOPY     CORONARY ARTERY BYPASS GRAFT N/A 02/15/2017   Procedure: CORONARY  ARTERY BYPASS GRAFTING times three  with left internal mammary harvest and endoscopic harvest of Right SVG. Grafts of LIMA to  LAD, SVG to Distal Circ, and to First Diag.;  Surgeon: Grace Isaac, MD;  Location: [facility];  Service: Open Heart Surgery;  Laterality: N/A;   ESOPHAGOGASTRODUODENOSCOPY N/A 11/14/2014   Procedure: ESOPHAGOGASTRODUODENOSCOPY (EGD);  Surgeon: Jerene Bears, MD;  Location: [facility] ENDOSCOPY;  Service: Endoscopy;  Laterality: N/A;   IR RADIOLOGIST EVAL & MGMT  07/04/2018   IR RADIOLOGIST EVAL & MGMT  07/18/2018   LAPAROTOMY N/A 05/19/2018   Procedure: EXPLORATORY LAPAROTOMY RIGHT COLECTOMY;  Surgeon: Fanny Skates, MD;  Location: [facility] ORS;  Service: General;  Laterality: N/A;   LEFT HEART CATH AND CORONARY ANGIOGRAPHY N/A 02/14/2017   Procedure: Left Heart Cath and Coronary Angiography;  Surgeon: Belva Crome, MD;  Location: [facility] CV LAB;  Service: Cardiovascular;  Laterality: N/A;   LUMBAR SPINE SURGERY  11/2008   Dr Joya Salm   TEE WITHOUT CARDIOVERSION N/A 02/15/2017   Procedure: TRANSESOPHAGEAL ECHOCARDIOGRAM (TEE);  Surgeon: Grace Isaac, MD;  Location: [facility];  Service: Open Heart Surgery;  Laterality: N/A;   UPPER GASTROINTESTINAL ENDOSCOPY      Current Medications: Current Meds  Medication Sig   acetaminophen (TYLENOL) 500 MG tablet Take 1 tablet (500 mg total) by mouth every 6 (six) hours as needed for mild pain.   apixaban (ELIQUIS) 5 MG TABS tablet Take 1 tablet (5 mg total) by mouth 2 (two) times daily.   bacitracin 500 UNIT/GM ointment Apply 1 application topically daily as needed for wound care.   Cholecalciferol (VITAMIN D-3 PO) Take 2 capsules by mouth daily.    hydrochlorothiazide (HYDRODIURIL) 25 MG tablet Take 1 tablet by mouth once daily   metoprolol succinate (TOPROL XL) 25 MG 24 hr tablet Take 1 tablet (25 mg total) by mouth daily.   nitroGLYCERIN (NITROSTAT) 0.4 MG SL tablet Place 1 tablet (0.4 mg total) under the tongue every 5 (five) minutes as  needed for chest pain (or tightness).   pravastatin (PRAVACHOL) 40 MG tablet Take 1 tablet (40 mg total) by mouth at bedtime.   [DISCONTINUED] metoprolol succinate (TOPROL-XL) 50 MG 24 hr tablet Take 25 mg by mouth daily. Take with or immediately following a meal.     Allergies:   Patient has no known allergies.   Social History   Socioeconomic History   Marital status: Married    Spouse name: Not on file   Number of children: 4   Years of education: Not on file   Highest education level: Not on file  Occupational History   Occupation: Retired Engineer, maintenance  Tobacco Use   Smoking status: Former    Packs/day: 1.25    Years: 50.00    Pack years: 62.50    Types:  Cigarettes    Quit date: 11/11/2008    Years since quitting: 12.4   Smokeless tobacco: Former    Types: Chew   Tobacco comments:    smoked off and on for years  Vaping Use   Vaping Use: Never used  Substance and Sexual Activity   Alcohol use: Yes    Alcohol/week: 6.0 standard drinks    Types: 2 Glasses of wine, 4 Cans of beer per week    Comment: vodka   Drug use: No   Sexual activity: Never  Other Topics Concern   Not on file  Social History Narrative   ** Merged History Encounter **       4 sons   Social Determinants of Health   Financial Resource Strain: Not on file  Food Insecurity: Not on file  Transportation Needs: Not on file  Physical Activity: Not on file  Stress: Not on file  Social Connections: Not on file     Family History: The patient's family history includes Cancer in his mother; Diabetes in his father and sister; Heart disease in his father; Hypertension in his father; Stroke in his father. There is no history of Stomach cancer, Pancreatic cancer, Colon cancer, Esophageal cancer, or Rectal cancer.  ROS:   Review of Systems  Constitutional:  Negative for diaphoresis and malaise/fatigue.  HENT:  Negative for hearing loss and sinus pain.   Eyes:  Negative for blurred vision and pain.   Respiratory:  Positive for shortness of breath. Negative for cough.   Cardiovascular:  Positive for leg swelling. Negative for chest pain, palpitations, orthopnea, claudication and PND.  Gastrointestinal:  Negative for abdominal pain, blood in stool, constipation and diarrhea.  Genitourinary:  Negative for dysuria and frequency.  Musculoskeletal:  Negative for back pain, joint pain and neck pain.  Neurological:  Negative for dizziness, tremors, weakness and headaches.    EKGs/Labs/Other Studies Reviewed:    The following studies were reviewed today: 2D Echo 05/2018 - Left ventricle: The cavity size was normal. Wall thickness was    increased in a pattern of mild LVH. Systolic function was normal.    The estimated ejection fraction was in the range of 55% to 60%.    Wall motion was normal; there were no regional wall motion    abnormalities. Doppler parameters are consistent with abnormal    left ventricular relaxation (grade 1 diastolic dysfunction).  - Aortic valve: Mildly calcified annulus. Trileaflet; mildly    thickened leaflets. Valve area (VTI): 4.6 cm^2. Valve area    (Vmax): 4.22 cm^2.  - Right atrium: There is a 1.2 by 1.6 cm echodense mass in the    right atrium, overall poorly visualized but best seen in the    apical 4 chamber views. The subcostal views are limited . When    reviewing the 02/15/17 TEE this area is also poorly visualized, but    does appear to be due to a prominent eustachian valve, a benign    finding.  - Pulmonary arteries: Systolic pressure was mildly increased. PA    peak pressure: 37 mm Hg (S).  - Systemic veins: IVC is small suggesting low RA pressure and    hypovolemia.   Cardiac Cath 02/2017 Heavily calcified left main and proximal to mid LAD. Eccentric 75% distal left main Mid LAD [place] 0, 1, 1 bifurcation stenosis with 90% LAD and 75% second diagonal. Subtotal occlusion, 99%, of the moderate sized first diagonal, possibly the source of the  patient's presentation. Ostial  80% circumflex. The distal circumflex is relatively small with 2 small to intermediate size branches distally. Mid to distal 70% PDA. The right coronary is dominant and otherwise widely patent. Mild hypokinesis mid anterior wall, likely due to occlusion of the first diagonal. Overall LV function is normal with EF 55%. Normal left ventricular filling pressures.   Recommendations:   Surgical consultation via TCTS for consideration of bypass surgery due to left main coronary disease and complex mid LAD stenoses.   Monitor 11/2020 Patient had a min HR of 61 bpm, max HR of 183 bpm, and avg HR of 93 bpm. Predominant underlying rhythm was Sinus Rhythm. Atrial Fibrillation occurred (18% burden), ranging from 67-183 bpm (avg of 113 bpm), the longest lasting 4 hours 4 mins with an avg rate of 111 bpm. Isolated PACs were frequent (12.3%, [id number]), PAC Couplets and triplets were frequent (5.0% and 4.9%). Isolated PVCs were rare (<1.0%), PVC Couplets were rare (<1.0%, 72).   Predominant underlying rhythm was Sinus Rhythm. Atrial Fibrillation occurred (18% burden), ranging from 67-183 bpm (avg of 113 bpm), the longest lasting 4 hours 4 mins with an avg rate of 111 bpm. Frequent PACs.       EKG:   04/22/2021: no EKG ordered today.  Recent Labs: 01/13/2021: Magnesium 1.9; TSH 0.886 02/02/2021: ALT 13; BUN 24; Creatinine 1.03; Hemoglobin 15.2; Platelet Count 148; Potassium 3.7; Sodium 140  Recent Lipid Panel    Component Value Date/Time   CHOL 128 01/13/2021 1019   TRIG 150 (H) 01/13/2021 1019   HDL 48 01/13/2021 1019   CHOLHDL 2.7 01/13/2021 1019   CHOLHDL 3.4 02/12/2017 0657   VLDL 17 02/12/2017 0657   LDLCALC 54 01/13/2021 1019   LDLDIRECT 64 04/24/2019 0917   LDLDIRECT 133.2 10/14/2011 1106     Risk Assessment/Calculations:    CHA2DS2-VASc Score = 4  {This indicates a 4.8% annual risk of stroke. The patient's score is based upon: CHF History: No HTN  History: Yes Diabetes History: No Stroke History: No Vascular Disease History: Yes Age Score: 2 Gender Score: 0         Physical Exam:    VS:  BP 132/82   Pulse 74   Ht 5\' 11"  (1.803 m)   Wt 226 lb 12.8 oz (102.9 kg)   SpO2 96%   BMI 31.63 kg/m     Wt Readings from Last 3 Encounters:  04/22/21 226 lb 12.8 oz (102.9 kg)  02/04/21 231 lb 11.2 oz (105.1 kg)  01/13/21 231 lb (104.8 kg)     GEN:  Elderly male, comfortable, NAD HEENT: Normal NECK: No JVD; No carotid bruits CARDIAC: RRR, soft systolic murmur. No rubs, gallops RESPIRATORY:  Diminished but clear  ABDOMEN: Soft, non-tender, non-distended MUSCULOSKELETAL: Trace-1+ pitting edema to mid shin with R>L (chronic) SKIN: Warm and dry NEUROLOGIC:  Alert and oriented x 3 PSYCHIATRIC:  Normal affect   ASSESSMENT:    1. CAD in native artery   2. Essential hypertension   3. Hyperlipidemia LDL goal <70   4. Mixed hyperlipidemia   5. Coronary artery disease involving coronary bypass graft of native heart without angina pectoris   6. S/P CABG x 3   7. Right atrial mass    PLAN:    In order of problems listed above:  #CAD s/p CABG in 2018: Doing overall well. Last took nitro 4 months ago with no recent anginal symptoms.   -Repeat TTE for monitoring -Continue metop 25mg  XL daily -Continue pravastatin 40mg  daily -Nitro prn -  Not on ASA due to need for apixaban  #Paroxymsal Afib: Doing well and asymptomatic. CHADs-vasc 4. Tolerating apixaban without bleeding issues.  -Continue apixaban 5mg  BID -Continue metop 25mg  XL daily -Follow-up with Dr. Lovena Le as scheudled  #HTN: Well controlled. -Continue metop 25mg  XL daily -Continue HCTZ 25mg  daily  #HLD: LDL at goal 54 in 01/2021 -Continue pravastatin 40mg  daily -Repeat lipids at next visit  #RA mass: Detected on prior TTE in 2019. Suspect eustachian valve but will repeat for monitoring. -Repeat TTE for monitoring  #LE edema: Chronic and overall stable. Will  repeat TTE as above. -Continue HCTZ -Repeat TTE as above     Follow-up in 6 months.  Medication Adjustments/Labs and Tests Ordered: Current medicines are reviewed at length with the patient today.  Concerns regarding medicines are outlined above.  Orders Placed This Encounter  Procedures   ECHOCARDIOGRAM COMPLETE   Meds ordered this encounter  Medications   metoprolol succinate (TOPROL XL) 25 MG 24 hr tablet    Sig: Take 1 tablet (25 mg total) by mouth daily.    Dispense:  90 tablet    Refill:  1    Pt to take 25 mg po daily    Patient Instructions  Medication Instructions:   TAKE METOPROLOL SUCCINATE 25 MG BY MOUTH DAILY  *If you need a refill on your cardiac medications before your next appointment, please call your pharmacy*   Testing/Procedures:  Your physician has requested that you have an echocardiogram. Echocardiography is a painless test that uses sound waves to create images of your heart. It provides your doctor with information about the size and shape of your heart and how well your heart's chambers and valves are working. This procedure takes approximately one hour. There are no restrictions for this procedure.  Follow-Up: At [facility], you and your health needs are our priority.  As part of our continuing mission to provide you with exceptional heart care, we have created designated Provider Care Teams.  These Care Teams include your primary Cardiologist (physician) and Advanced Practice Providers (APPs -  Physician Assistants and Nurse Practitioners) who all work together to provide you with the care you need, when you need it.  We recommend signing up for the patient portal called "MyChart".  Sign up information is provided on this After Visit Summary.  MyChart is used to connect with patients for Virtual Visits (Telemedicine).  Patients are able to view lab/test results, encounter notes, upcoming appointments, etc.  Non-urgent messages can be sent to your  provider as well.   To learn more about what you can do with MyChart, go to [email or web address].    Your next appointment:   6 month(s)  The format for your next appointment:   In Person  Provider:   Gwyndolyn Kaufman, MD       Ardell Isaacs as a scribe for Freada Bergeron, MD.,have documented all relevant documentation on the behalf of Freada Bergeron, MD,as directed by  Freada Bergeron, MD while in the presence of Freada Bergeron, MD.  I, Freada Bergeron, MD, have reviewed all documentation for this visit. The documentation on 04/22/21 for the exam, diagnosis, procedures, and orders are all accurate and complete.   Signed, Freada Bergeron, MD  04/22/2021 10:40 AM    [place]

## 2021-05-01 ENCOUNTER — Telehealth: Payer: Self-pay | Admitting: *Deleted

## 2021-05-01 MED ORDER — FUROSEMIDE 20 MG PO TABS: ORAL_TABLET | ORAL | 0 refills | Status: DC

## 2021-05-01 NOTE — Telephone Encounter (Signed)
Spoke with the pt and wife.  Pt states he is having increased lower extremity edema.  He has no other cardiac complaints.  He states he is not eating salt and is taking all his cardiac meds as prescribed. Spoke with Dr. Johney Frame about this, and she states we will call him in lasix 20 mg po daily for 5 days only, HOLD his HCTZ while taking lasix, then resume back the HCTZ on day 6, as prescribed.  Confirmed the pharmacy of choice with the pt. Note placed to the pts pharmacy to remind him to hold HCTZ while taking lasix then resume once lasix is complete. Advised the pt and wife that he needs to maintain a low sodium diet, elevated his extremities at rest, and wear his compressions during the day. Both verbalized understanding and agrees with this plan.

## 2021-05-06 DIAGNOSIS — C44311 Basal cell carcinoma of skin of nose: Secondary | ICD-10-CM | POA: Diagnosis not present

## 2021-05-14 ENCOUNTER — Ambulatory Visit (HOSPITAL_COMMUNITY): Payer: Medicare HMO | Attending: Cardiology

## 2021-05-14 ENCOUNTER — Other Ambulatory Visit: Payer: Self-pay

## 2021-05-14 DIAGNOSIS — I5189 Other ill-defined heart diseases: Secondary | ICD-10-CM | POA: Diagnosis not present

## 2021-05-14 LAB — ECHOCARDIOGRAM COMPLETE
Area-P 1/2: 3.53 cm2
S' Lateral: 3.2 cm

## 2021-05-21 ENCOUNTER — Encounter: Payer: Self-pay | Admitting: Hematology

## 2021-05-21 ENCOUNTER — Inpatient Hospital Stay: Payer: Medicare HMO

## 2021-05-21 ENCOUNTER — Inpatient Hospital Stay: Payer: Medicare HMO | Attending: Hematology | Admitting: Hematology

## 2021-05-21 ENCOUNTER — Other Ambulatory Visit: Payer: Self-pay

## 2021-05-21 VITALS — BP 143/90 | HR 79 | Temp 98.3°F | Resp 20 | Ht 71.0 in | Wt 224.0 lb

## 2021-05-21 DIAGNOSIS — C182 Malignant neoplasm of ascending colon: Secondary | ICD-10-CM | POA: Diagnosis not present

## 2021-05-21 DIAGNOSIS — I4891 Unspecified atrial fibrillation: Secondary | ICD-10-CM | POA: Diagnosis not present

## 2021-05-21 DIAGNOSIS — Z85118 Personal history of other malignant neoplasm of bronchus and lung: Secondary | ICD-10-CM | POA: Diagnosis not present

## 2021-05-21 DIAGNOSIS — I2510 Atherosclerotic heart disease of native coronary artery without angina pectoris: Secondary | ICD-10-CM | POA: Diagnosis not present

## 2021-05-21 DIAGNOSIS — Z85038 Personal history of other malignant neoplasm of large intestine: Secondary | ICD-10-CM | POA: Insufficient documentation

## 2021-05-21 DIAGNOSIS — C3431 Malignant neoplasm of lower lobe, right bronchus or lung: Secondary | ICD-10-CM | POA: Diagnosis not present

## 2021-05-21 DIAGNOSIS — Z7901 Long term (current) use of anticoagulants: Secondary | ICD-10-CM | POA: Insufficient documentation

## 2021-05-21 DIAGNOSIS — I700 Atherosclerosis of aorta: Secondary | ICD-10-CM | POA: Diagnosis not present

## 2021-05-21 DIAGNOSIS — C3491 Malignant neoplasm of unspecified part of right bronchus or lung: Secondary | ICD-10-CM

## 2021-05-21 LAB — CBC WITH DIFFERENTIAL (CANCER CENTER ONLY)
Abs Immature Granulocytes: 0.01 10*3/uL (ref 0.00–0.07)
Basophils Absolute: 0 10*3/uL (ref 0.0–0.1)
Basophils Relative: 0 %
Eosinophils Absolute: 0.1 10*3/uL (ref 0.0–0.5)
Eosinophils Relative: 1 %
HCT: 43.7 % (ref 39.0–52.0)
Hemoglobin: 14.9 g/dL (ref 13.0–17.0)
Immature Granulocytes: 0 %
Lymphocytes Relative: 24 %
Lymphs Abs: 1.2 10*3/uL (ref 0.7–4.0)
MCH: 29.8 pg (ref 26.0–34.0)
MCHC: 34.1 g/dL (ref 30.0–36.0)
MCV: 87.4 fL (ref 80.0–100.0)
Monocytes Absolute: 0.6 10*3/uL (ref 0.1–1.0)
Monocytes Relative: 12 %
Neutro Abs: 3.1 10*3/uL (ref 1.7–7.7)
Neutrophils Relative %: 63 %
Platelet Count: 163 10*3/uL (ref 150–400)
RBC: 5 MIL/uL (ref 4.22–5.81)
RDW: 13.6 % (ref 11.5–15.5)
WBC Count: 5 10*3/uL (ref 4.0–10.5)
nRBC: 0 % (ref 0.0–0.2)

## 2021-05-21 LAB — CMP (CANCER CENTER ONLY)
ALT: 18 U/L (ref 0–44)
AST: 18 U/L (ref 15–41)
Albumin: 4 g/dL (ref 3.5–5.0)
Alkaline Phosphatase: 90 U/L (ref 38–126)
Anion gap: 9 (ref 5–15)
BUN: 16 mg/dL (ref 8–23)
CO2: 28 mmol/L (ref 22–32)
Calcium: 9.6 mg/dL (ref 8.9–10.3)
Chloride: 102 mmol/L (ref 98–111)
Creatinine: 1 mg/dL (ref 0.61–1.24)
GFR, Estimated: >60 mL/min (ref >60)
Glucose, Bld: 92 mg/dL (ref 70–99)
Potassium: 3.8 mmol/L (ref 3.5–5.1)
Sodium: 139 mmol/L (ref 135–145)
Total Bilirubin: 0.7 mg/dL (ref 0.3–1.2)
Total Protein: 7 g/dL (ref 6.5–8.1)

## 2021-05-21 LAB — CEA (IN HOUSE-CHCC): CEA (CHCC-In House): 2.45 ng/mL (ref 0.00–5.00)

## 2021-05-21 NOTE — Progress Notes (Signed)
[facility]   Telephone:([phone]) [phone] Fax:(336) [phone]   Clinic Follow up Note   Patient Care Team: Program, [facility] as PCP - General Lovena Le, Champ Mungo, MD as PCP - Electrophysiology (Cardiology) Pyrtle, Lajuan Lines, MD as Consulting Physician (Gastroenterology) Allyn Kenner, MD as Consulting Physician (Dermatology) Dorothy Spark, MD (Inactive) as Consulting Physician (Cardiology) Levin Erp, [place] as Physician Assistant (Gastroenterology) Murrell Converse as Physician Assistant (Cardiology)  Date of Service:  05/21/2021  CHIEF COMPLAINT: f/u of stage III colon cancer and lung cancer  SUMMARY OF ONCOLOGIC HISTORY: Oncology History Overview Note  Cancer Staging Cancer of ascending colon pT4apN1 s/p right colectomy 05/19/2018 Staging form: Colon and Rectum, AJCC 8th Edition - Pathologic stage from 05/19/2018: Stage IIIB (pT4a, pN1, cM0) - Signed by Truitt Merle, MD on 05/24/2018     Cancer of ascending colon pT4apN1 s/p right colectomy 05/19/2018  05/18/2018 Imaging   05/18/2018 CT AP IMPRESSION: 1. There appears to be a nearly obstructing lesion in the region of the hepatic flexure of the colon which is highly concerning for primary colonic neoplasm. Slight haziness of the surrounding soft tissues may be indicative of early local invasion. This appears partially obstructive as evidenced by dilatation of more proximal aspects of the small bowel and colon. 2. Left adrenal lesion is stable compared to prior studies, previously characterized as an adenoma. 3. Aortic atherosclerosis, in addition to left main and 3 vessel coronary artery disease. Status post median sternotomy for CABG. 4. Additional incidental findings, as above.   Aortic Atherosclerosis (ICD10-I70.0).   05/18/2018 - 05/30/2018 Hospital Admission   Admit date: 05/18/2018 Admission diagnosis: Colon Cancer Discharged on: 05/30/2018   05/19/2018 Cancer Staging   Staging  form: Colon and Rectum, AJCC 8th Edition - Pathologic stage from 05/19/2018: Stage IIIB (pT4a, pN1, cM0) - Signed by Truitt Merle, MD on 05/24/2018    05/19/2018 Pathology Results   05/19/2018 Surgical Pathology Diagnosis Colon, segmental resection for tumor, right - INVASIVE COLORECTAL ADENOCARCINOMA, 5 CM. - CARCINOMA FOCALLY LESS THAN 0.1 CM FROM SEROSAL SURFACE. - MARGINS NOT INVOLVED. - METASTATIC CARCINOMA IN ONE OF EIGHT LYMPH NODES (1/9). - BENIGN APPENDIX.   05/19/2018 Surgery   EXPLORATORY LAPAROTOMY with right colectomy with Fanny Skates, MD   05/24/2018 Initial Diagnosis   Cancer of right colon ([place])   06/12/2018 Imaging     06/12/2018 CT CAP IMPRESSION: 1. Evidence all bowel perforation/anastomosed breakdown with large volume intraperitoneal free air fluid in the RIGHT upper quadrant adjacent and above the liver. 2. Large loculated fluid collection in the RIGHT anterior pelvis extending along the LEFT pericolic gutter consists with PERITONEAL ABSCESS. 3. Of note, no large volume intraperitoneal free fluid. Oral contrast passed through the RIGHT colon anastomosis without evidence of leak. Findings could indicate a prior anastomotic breakdown which has healed in the interval with subsequent abscess formation from the prior leak / breakdown.   06/17/2018 Procedure   06/17/2018 US Thoracentesis IMPRESSION: Successful ultrasound guided right thoracentesis yielding 1 L of pleural fluid. No pneumothorax on post-procedure chest x-ray.  No malignancy on pathology   06/20/2018 Imaging   06/20/2018 CT AP IMPRESSION: 1. Decompression of right upper quadrant air and fluid collection after percutaneous catheter drainage. There remains a tiny rim of fluid as well as small extraluminal gas bubbles in this region suspicious for continued leak from bowel. 2. Further decrease in size of residual abscess fluid collection in the pelvis after percutaneous catheter drainage. There remains  some fluid around the drainage catheter in the right pelvis.   07/04/2018 Imaging   07/04/2018 CT AP IMPRESSION: 1. Further decompression of right perihepatic abscess with resolution of extraluminal air. 2. Relatively stable volume of fluid surrounding the right pelvic drainage catheter.   07/05/2018 Imaging   07/05/2018 CT AP IMPRESSION: 1. No change compared to the CT scan performed 9 hours earlier. 2. Anterior right perihepatic space collection is decompressed by indwelling percutaneous drain, with no residual measurable collection in this location. 3. Thick walled right lower quadrant collection with indwelling percutaneous drain is stable. No new focal fluid collections. 4. Stable postsurgical changes from right hemicolectomy with no evidence of bowel obstruction. Stable reactive wall thickening in the sigmoid colon adjacent to the right lower quadrant collection. 5. Stable small to moderate dependent right pleural effusion. 6. Stable left adrenal adenoma. 7.  Aortic Atherosclerosis (ICD10-I70.0).   07/31/2018 - 01/21/2019 Chemotherapy   Adjuvant Xeloda 2000mg  bid, 7 days on, 7 days off.  Reduced to 2000mg  in the AM and 1500mg  in the PM due to diarrhea. Completed on 01/21/19    04/12/2019 Imaging   CT CAP W contrast 04/12/19  IMPRESSION: 1. Status post right hemicolectomy. 2. No definite metastatic disease in the chest, abdomen, or pelvis. 7 mm right lower lobe pulmonary nodule, obscured by atelectasis on previous chest CT and not included on multiple prior abdomen / pelvis CTs. Close follow-up recommended to ensure stability. 3.  Aortic Atherosclerois (ICD10-170.0)   09/04/2019 Imaging   CT AP W Contrast I MPRESSION: 1. No evidence of local colon cancer recurrence at the RIGHT hemicolectomy site. 2. No evidence of metastatic disease in the abdomen pelvis. 3. Mild nodularity at the LEFT lung base is favored benign. Recommend attention on routine surveillance 4. Aortic  Atherosclerosis (ICD10-I70.0).   07/02/2020 Relapse/Recurrence   FINAL MICROSCOPIC DIAGNOSIS:   A. LUNG, RIGHT LOWER LOBE, NEEDLE CORE BIOPSY:  - Non-small cell carcinoma, see comment    COMMENT:   Immunohistochemical stains for characterization of the tumor phenotype  are pending and will be reported in an addendum.  Dr. Tresa Moore reviewed the  case and concurs with the diagnosis. Dr. [name] was notified on 07/03/2020.    Malignant neoplasm of bronchus of right lower lobe ([place])  05/30/2020 Imaging   CT CAP w contrast IMPRESSION: 1. Significant enlargement of a right lower lobe pulmonary nodule. Suspicious for primary bronchogenic carcinoma. Isolated pulmonary metastasis felt less likely. 2. Otherwise, no evidence of metastatic disease from patient's colon cancer. 3. Aortic atherosclerosis (ICD10-I70.0) and emphysema (ICD10-J43.9). 4. Left adrenal adenoma.   These results will be called to the ordering clinician or representative by the Radiologist Assistant, and communication documented in the PACS or [employer].   06/11/2020 PET scan   IMPRESSION: 1. The index nodule within the superior segment of the right lower lobe is FDG avid and worrisome for either primary bronchogenic neoplasm versus metastatic disease 2. Within the anteromedial aspect of the left maxillary sinus, in the region of the nasolacrimal duct, there is a focal soft tissue attenuating filling defect which exhibits intense FDG uptake within SUV max of 55.8. The although focal uptake within the paranasal sinuses may be seen with sinusitis primary neoplasm of the sinuses or nasal lacrimal duct cannot be excluded. In light of the intense FDG uptake in this area further investigation with contrast enhanced CT or MRI of the soft tissues of neck to include the maxillary sinuses is recommended. 3.  Aortic Atherosclerosis (ICD10-I70.0). 4. Coronary  artery calcifications 5. Prior granulomatous disease.      07/02/2020 Cancer Staging   Staging form: Lung, AJCC 8th Edition - Clinical stage from 07/02/2020: Stage IA2 (cT1b, cN0, cM0) - Signed by Truitt Merle, MD on 07/06/2020   07/06/2020 Initial Diagnosis   Non-small cell lung cancer, right ([place])   07/30/2020 - 08/08/2020 Radiation Therapy   SBRT to lung 07/16/20-08/04/20 with Dr Lisbeth Renshaw.    11/07/2020 Imaging   CT Chest  IMPRESSION: 1. Decreased size of dominant area of nodularity in the RIGHT chest with surrounding post treatment changes. 2. New area of nodularity along the pleural surface along the posterosuperior segment of the RIGHT lower lobe is suspicious but indeterminate at this time. Could consider short interval follow-up or PET imaging for further assessment as this area would be challenging for tissue sampling. There is also the possibility this area could somehow related to post treatment changes as well, though the morphologic features do raise suspicion. 3. Stable LEFT adrenal adenoma. 4. Changes of CABG. 5. Aortic atherosclerosis.   Aortic Atherosclerosis (ICD10-I70.0).      CURRENT THERAPY:  Surveillance  INTERVAL HISTORY:  Vincent Mcclure is here for a follow up of colon and lung cancers. He was last seen by me on 02/04/21. He presents to the clinic alone. He reports he had a spot of basal cell carcinoma removed from his nose about two months ago. He reports he had a vein removed from his right lower leg because of swelling. 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 "I'm getting rid of all my male doctors now." He notes he lost one of his sons since his last visit here. The son had been in an accident about 6 years ago and "had been invalid since then."    All other systems were reviewed with the patient and are negative.  MEDICAL HISTORY:  Past Medical History:  Diagnosis Date   Alcohol abuse    Anemia    Arthritis    hands   Blood transfusion without reported diagnosis    CAD (coronary artery disease)    a.  NSTEMI 2018 s/p CABG.   CHF (congestive heart failure) ([name])    Chronic low back pain 10/14/2011   colon ca dx'd 05/18/18   colon cancer   Colon polyps    hyperplastic (2004, 2010) and adenomatous (1990).     COLONIC POLYPS, HX OF 03/05/2008   Esophageal stricture    hx of   Gastric ulcer    GERD 03/05/2008   GERD (gastroesophageal reflux disease) 1994   associated peptic strictures   HYPERLIPIDEMIA 03/05/2008   HYPERTENSION 03/05/2008   Hypertension    IBS 03/05/2008   Impaired glucose tolerance 10/12/2011   Iron deficiency anemia    LEG CRAMPS 09/02/2010   Lung cancer ([place]) dx'd 06/2020   Myocardial infarction [facility])    May 2018   PAD (peripheral artery disease) ([place]) 2013   PAF (paroxysmal atrial fibrillation) ([place])    PERIPHERAL EDEMA 09/02/2010   PERIPHERAL NEUROPATHY 03/05/2008   Premature atrial contraction    Prostatitis    hx of   PVC's (premature ventricular contractions)    Sinus brady-tachy syndrome ([place])    VARICOSE VEINS, LOWER EXTREMITIES 06/09/2009    SURGICAL HISTORY: Past Surgical History:  Procedure Laterality Date   CATARACT EXTRACTION  bilat   COLECTOMY     COLONOSCOPY     CORONARY ARTERY BYPASS GRAFT N/A 02/15/2017   Procedure: CORONARY ARTERY BYPASS GRAFTING times three  with  left internal mammary harvest and endoscopic harvest of Right SVG. Grafts of LIMA to  LAD, SVG to Distal Circ, and to First Diag.;  Surgeon: Grace Isaac, MD;  Location: [facility];  Service: Open Heart Surgery;  Laterality: N/A;   ESOPHAGOGASTRODUODENOSCOPY N/A 11/14/2014   Procedure: ESOPHAGOGASTRODUODENOSCOPY (EGD);  Surgeon: Jerene Bears, MD;  Location[facility] END[facility];  Service: Endoscopy;  Laterality: N/A;   IR RADIOLOGIST EVAL & MGMT  07/04/2018   IR RADIOLOGIST EVAL & MGMT  07/18/2018   LAPAROTOMY N/A 05/19/2018   Procedure: EXPLORATORY LAPAROTOMY RIGHT COLECTOMY;  Surgeon: Fanny Skates, MD;  Location: [facility] OR[facility];  Service: General;  Laterality: N/A;   LEFT HEART CATH AND CORONARY  ANGIOGRAPHY N/A 02/14/2017   Procedure: Left Heart Cath and Coronary Angiography;  Surgeon: Belva Crome, MD;  Location: [facility];  Service: Cardiovascular;  Laterality: N/A;   LUMBAR SPINE SURGERY  11/2008   Dr Joya Salm   TEE WITHOUT CARDIOVERSION N/A 02/15/2017   Procedure: TRANSESOPHAGEAL ECHOCARDIOGRAM (TEE);  Surgeon: Grace Isaac, MD;  Location: [facility];  Service: Open Heart Surgery;  Laterality: N/A;   UPPER GASTROINTESTINAL ENDOSCOPY      I have reviewed the social history and family history with the patient and they are unchanged from previous note.  ALLERGIES:  has No Known Allergies.  MEDICATIONS:  Current Outpatient Medications  Medication Sig Dispense Refill   acetaminophen (TYLENOL) 500 MG tablet Take 1 tablet (500 mg total) by mouth every 6 (six) hours as needed for mild pain.  0   apixaban (ELIQUIS) 5 MG TABS tablet Take 1 tablet (5 mg total) by mouth 2 (two) times daily. 60 tablet 11   bacitracin 500 UNIT/GM ointment Apply 1 application topically daily as needed for wound care.     Cholecalciferol (VITAMIN D-3 PO) Take 2 capsules by mouth daily.      furosemide (LASIX) 20 MG tablet Take 1 tablet (20 mg total) by mouth daily for 5 days only. Hold HCTZ while taking this med and restart this once this med is complete 5 tablet 0   hydrochlorothiazide (HYDRODIURIL) 25 MG tablet Take 1 tablet by mouth once daily 90 tablet 3   metoprolol succinate (TOPROL XL) 25 MG 24 hr tablet Take 1 tablet (25 mg total) by mouth daily. 90 tablet 1   nitroGLYCERIN (NITROSTAT) 0.4 MG SL tablet Place 1 tablet (0.4 mg total) under the tongue every 5 (five) minutes as needed for chest pain (or tightness). 25 tablet 3   pravastatin (PRAVACHOL) 40 MG tablet Take 1 tablet (40 mg total) by mouth at bedtime. 90 tablet 3   No current facility-administered medications for this visit.    PHYSICAL EXAMINATION: ECOG PERFORMANCE STATUS: 1 - Symptomatic but completely ambulatory  Vitals:    05/21/21 1016  BP: (!) 143/90  Pulse: 79  Resp: 20  Temp: 98.3 F (36.8 C)  SpO2: 96%   Wt Readings from Last 3 Encounters:  05/21/21 224 lb (101.6 kg)  04/22/21 226 lb 12.8 oz (102.9 kg)  02/04/21 231 lb 11.2 oz (105.1 kg)     GENERAL:alert, no distress and comfortable SKIN: skin color, texture, turgor are normal, no rashes or significant lesions EYES: normal, Conjunctiva are pink and non-injected, sclera clear  NECK: supple, thyroid normal size, non-tender, without nodularity LYMPH:  no palpable lymphadenopathy in the cervical, axillary  LUNGS: clear to auscultation and percussion with normal breathing effort HEART: regular rate & rhythm and no murmurs and no lower extremity  edema ABDOMEN:abdomen soft, non-tender and normal bowel sounds Musculoskeletal:no cyanosis of digits and no clubbing  NEURO: alert & oriented x 3 with fluent speech, no focal motor/sensory deficits  LABORATORY DATA:  I have reviewed the data as listed CBC Latest Ref Rng & Units 05/21/2021 02/02/2021 01/13/2021  WBC 4.0 - 10.5 K/uL 5.0 4.9 4.0  Hemoglobin 13.0 - 17.0 g/dL 14.9 15.2 14.9  Hematocrit 39.0 - 52.0 % 43.7 45.0 43.7  Platelets 150 - 400 K/uL 163 148(L) 167     CMP Latest Ref Rng & Units 05/21/2021 02/02/2021 01/13/2021  Glucose 70 - 99 mg/dL 92 98 132(H)  BUN 8 - 23 mg/dL 16 24(H) 17  Creatinine 0.61 - 1.24 mg/dL 1.00 1.03 1.11  Sodium 135 - 145 mmol/L 139 140 138  Potassium 3.5 - 5.1 mmol/L 3.8 3.7 3.7  Chloride 98 - 111 mmol/L 102 103 99  CO2 22 - 32 mmol/L 28 25 25   Calcium 8.9 - 10.3 mg/dL 9.6 9.6 9.6  Total Protein 6.5 - 8.1 g/dL 7.0 6.9 -  Total Bilirubin 0.3 - 1.2 mg/dL 0.7 0.7 -  Alkaline Phos 38 - 126 U/L 90 65 -  AST 15 - 41 U/L 18 17 -  ALT 0 - 44 U/L 18 13 -      RADIOGRAPHIC STUDIES: I have personally reviewed the radiological images as listed and agreed with the findings in the report. No results found.   ASSESSMENT & PLAN:  Vincent Mcclure is a 87 y.o. male with   1.  Non-small cell lung cancer in right lower lobe, squamous cell carcinoma, cT1bN0M0 stage IA -This was discovered on his 05/30/20 surveillance CT scan for colon cancer, the lesion was hypermetabolic on 03/11/94 PET, no other distant metastasis. -His 07/02/20 fine-needle biopsy of the lung mass showed squamous cell carcinoma. He had a history of heavy smoking. -He was treated with SBRT to lung 07/30/20-08/08/20 with Dr Lisbeth Renshaw. He is now on surveillance.  -CT CAP 02/02/21 showed No findings of locally recurrent colon cancer or new sites of disease. -He is clinically doing well from a cancer standpoint. Labs reviewed, CBC and CMP WNL, CMP and CEA still pending. There is no clinical concern for recurrence.  -Continue surveillance.  -F/u in 3 months with restaging CT prior to visit   2. Adenocarcinoma of right colon, invasive adenocarcinoma, stage IIIB (pT4a, pN1, cM0), MSS -Diagnosed in 05/2018. Treated with hemicolectomy. He received adjuvant Xeloda 2000 mg BID one week on, one week off from 07/31/2018 to 01/29/2019.  -He is clinically doing well, no evidence of recurrence so far. Latest CT CAP from 02/02/21 showed NED.  -Continue surveillance. Plan to repeat CT in in 3 months    3. Left maxillary sinus mass -He was found to have a hypermetabolic left maxillary sinus lesion. His CT Maxilla on 08/05/20 showed 67mm mass in the left nasal lacrimal duct. It has slightly enlarged since 2016 -He was seen by Dr Lucia Gaskins who removed what he could in late 2021. There was remaining mass in left maxillary sinus. He was seen by Specialist Dr Lorina Rabon who ordered MRI. His 09/08/20 MRI showed mass is still present in left lacrimal duct mass with obstruction.  -He will continue to f/u with Dr Lorina Rabon    4. CAD S/P CABG, Afib  -F/u with Dr Raliegh Scarlet and Dr Meda Coffee. Continue on medications -He had episode of Afib in 07/2020. S/p heart monitor, he was started on Eliquis in 11/2020, given tolerance issue he only takes half  tablet  BID.  -He does have mild cough which he attributes to COPD.   -Echo on 05/14/21 showed prominent crista terminalis, no evidence of mass.     Plan -Lab and f/u in 3 month with lab and CT CAP w contrast a few days before    No problem-specific Assessment & Plan notes found for this encounter.   Orders Placed This Encounter  Procedures   CT CHEST ABDOMEN PELVIS W CONTRAST    Standing Status:   Future    Standing Expiration Date:   05/21/2022    Order Specific Question:   If indicated for the ordered procedure, I authorize the administration of contrast media per Radiology protocol    Answer:   Yes    Order Specific Question:   Preferred imaging location?    Answer:   [facility]    Order Specific Question:   Release to patient    Answer:   Immediate    Order Specific Question:   Is Oral Contrast requested for this exam?    Answer:   Yes, Per Radiology protocol    Order Specific Question:   Reason for Exam (SYMPTOM  OR DIAGNOSIS REQUIRED)    Answer:   rule out cancer recurrence   All questions were answered. The patient knows to call the clinic with any problems, questions or concerns. No barriers to learning was detected. The total time spent in the appointment was 30 minutes.     Truitt Merle, MD 05/21/2021   I, Wilburn Mylar, am acting as scribe for Truitt Merle, MD.   I have reviewed the above documentation for accuracy and completeness, and I agree with the above.

## 2021-05-27 ENCOUNTER — Telehealth: Payer: Self-pay

## 2021-05-27 NOTE — Telephone Encounter (Signed)
This nurse received a call from this patient who stated that he had an office visit on 05/21/21 and he usually gets a copy of his lab results however he forgot this time.  Patient request to have a copy mailed to his home. This nurse advised they would be mailed out today.  No further questions or concerns at this time.

## 2021-06-09 ENCOUNTER — Telehealth: Payer: Self-pay | Admitting: Family Medicine

## 2021-06-09 ENCOUNTER — Encounter: Payer: Self-pay | Admitting: Physician Assistant

## 2021-06-09 ENCOUNTER — Ambulatory Visit (INDEPENDENT_AMBULATORY_CARE_PROVIDER_SITE_OTHER): Payer: Medicare HMO | Admitting: Physician Assistant

## 2021-06-09 ENCOUNTER — Other Ambulatory Visit: Payer: Self-pay

## 2021-06-09 VITALS — BP 99/61 | HR 86 | Temp 99.3°F

## 2021-06-09 DIAGNOSIS — U071 COVID-19: Secondary | ICD-10-CM | POA: Diagnosis not present

## 2021-06-09 MED ORDER — MOLNUPIRAVIR EUA 200MG CAPSULE: 4.0000 | ORAL_CAPSULE | Freq: Two times a day (BID) | ORAL | 0 refills | Status: AC

## 2021-06-09 NOTE — Telephone Encounter (Signed)
I have attempted to contact patient to rectify the situation below. No answer, left message on both phone numbers listed in chart.  Patient has not been seen by another PCP. He last saw Opalski in 2020 and therefore is still a patient in our clinic.   Patient is 87 years old with a risk score of 8 for complications with covid.

## 2021-06-09 NOTE — Telephone Encounter (Signed)
I am happy to treat him with anti viral medication. I can send those to his pharmacy. He may even need referral to infusion clinic. I need to know his symptoms and how long they have been present. If you can touch base with him again, I will treat him

## 2021-06-09 NOTE — Telephone Encounter (Signed)
Patient added to [facility]'s schedule. AS, CMA

## 2021-06-09 NOTE — Progress Notes (Signed)
Acute Office Visit  Subjective:    Patient ID: Vincent Mcclure, male    DOB: October 08, 1932, 87 y.o.   MRN: [id number]  Chief Complaint  Patient presents with   Acute Visit    COVID-19 Suspected    HPI Patient is in today for c/o feeling achy, dry cough, low grade fever, general malaise and fatigue. Reports symptoms started Sunday (2 days ago). Denies fever, shortness of breath, wheezing, or altered mental status. Patient has received three doses of [place]. States does feel better today.  Past Medical History:  Diagnosis Date   Alcohol abuse    Anemia    Arthritis    hands   Blood transfusion without reported diagnosis    CAD (coronary artery disease)    a. NSTEMI 2018 s/p CABG.   CHF (congestive heart failure) ([name])    Chronic low back pain 10/14/2011   colon ca dx'd 05/18/18   colon cancer   Colon polyps    hyperplastic (2004, 2010) and adenomatous (1990).     COLONIC POLYPS, HX OF 03/05/2008   Esophageal stricture    hx of   Gastric ulcer    GERD 03/05/2008   GERD (gastroesophageal reflux disease) 1994   associated peptic strictures   HYPERLIPIDEMIA 03/05/2008   HYPERTENSION 03/05/2008   Hypertension    IBS 03/05/2008   Impaired glucose tolerance 10/12/2011   Iron deficiency anemia    LEG CRAMPS 09/02/2010   Lung cancer ([place]) dx'd 06/2020   Myocardial infarction [facility])    May 2018   PAD (peripheral artery disease) ([place]) 2013   PAF (paroxysmal atrial fibrillation) ([place])    PERIPHERAL EDEMA 09/02/2010   PERIPHERAL NEUROPATHY 03/05/2008   Premature atrial contraction    Prostatitis    hx of   PVC's (premature ventricular contractions)    Sinus brady-tachy syndrome ([place])    VARICOSE VEINS, LOWER EXTREMITIES 06/09/2009    Past Surgical History:  Procedure Laterality Date   CATARACT EXTRACTION  bilat   COLECTOMY     COLONOSCOPY     CORONARY ARTERY BYPASS GRAFT N/A 02/15/2017   Procedure: CORONARY ARTERY BYPASS GRAFTING times three  with left internal mammary harvest and  endoscopic harvest of Right SVG. Grafts of LIMA to  LAD, SVG to Distal Circ, and to First Diag.;  Surgeon: Grace Isaac, MD;  Location: [place];  Service: Open Heart Surgery;  Laterality: N/A;   ESOPHAGOGASTRODUODENOSCOPY N/A 11/14/2014   Procedure: ESOPHAGOGASTRODUODENOSCOPY (EGD);  Surgeon: Jerene Bears, MD;  Location: [facility] ENDOSCOPY;  Service: Endoscopy;  Laterality: N/A;   IR RADIOLOGIST EVAL & MGMT  07/04/2018   IR RADIOLOGIST EVAL & MGMT  07/18/2018   LAPAROTOMY N/A 05/19/2018   Procedure: EXPLORATORY LAPAROTOMY RIGHT COLECTOMY;  Surgeon: Fanny Skates, MD;  Location: [facility];  Service: General;  Laterality: N/A;   LEFT HEART CATH AND CORONARY ANGIOGRAPHY N/A 02/14/2017   Procedure: Left Heart Cath and Coronary Angiography;  Surgeon: Belva Crome, MD;  Location: [place] CV LAB;  Service: Cardiovascular;  Laterality: N/A;   LUMBAR SPINE SURGERY  11/2008   Dr Joya Salm   TEE WITHOUT CARDIOVERSION N/A 02/15/2017   Procedure: TRANSESOPHAGEAL ECHOCARDIOGRAM (TEE);  Surgeon: Grace Isaac, MD;  Location: [place];  Service: Open Heart Surgery;  Laterality: N/A;   UPPER GASTROINTESTINAL ENDOSCOPY      Family History  Problem Relation Age of Onset   Cancer Mother        Brain Cancer   Diabetes Father  Heart disease Father        CAD   Hypertension Father    Stroke Father    Diabetes Sister    Stomach cancer Neg Hx    Pancreatic cancer Neg Hx    Colon cancer Neg Hx    Esophageal cancer Neg Hx    Rectal cancer Neg Hx     Social History   Socioeconomic History   Marital status: Married    Spouse name: Not on file   Number of children: 4   Years of education: Not on file   Highest education level: Not on file  Occupational History   Occupation: Retired Engineer, maintenance  Tobacco Use   Smoking status: Former    Packs/day: 1.25    Years: 50.00    Pack years: 62.50    Types: Cigarettes    Quit date: 11/11/2008    Years since quitting: 12.5   Smokeless tobacco: Former    Types:  Chew   Tobacco comments:    smoked off and on for years  Vaping Use   Vaping Use: Never used  Substance and Sexual Activity   Alcohol use: Yes    Alcohol/week: 6.0 standard drinks    Types: 2 Glasses of wine, 4 Cans of beer per week    Comment: vodka   Drug use: No   Sexual activity: Never  Other Topics Concern   Not on file  Social History Narrative   ** Merged History Encounter **       4 sons   Social Determinants of Radio broadcast assistant Strain: Not on file  Food Insecurity: Not on file  Transportation Needs: Not on file  Physical Activity: Not on file  Stress: Not on file  Social Connections: Not on file  Intimate Partner Violence: Not on file    Outpatient Medications Prior to Visit  Medication Sig Dispense Refill   acetaminophen (TYLENOL) 500 MG tablet Take 1 tablet (500 mg total) by mouth every 6 (six) hours as needed for mild pain.  0   apixaban (ELIQUIS) 5 MG TABS tablet Take 1 tablet (5 mg total) by mouth 2 (two) times daily. 60 tablet 11   bacitracin 500 UNIT/GM ointment Apply 1 application topically daily as needed for wound care.     Cholecalciferol (VITAMIN D-3 PO) Take 2 capsules by mouth daily.      hydrochlorothiazide (HYDRODIURIL) 25 MG tablet Take 1 tablet by mouth once daily 90 tablet 3   metoprolol succinate (TOPROL XL) 25 MG 24 hr tablet Take 1 tablet (25 mg total) by mouth daily. 90 tablet 1   nitroGLYCERIN (NITROSTAT) 0.4 MG SL tablet Place 1 tablet (0.4 mg total) under the tongue every 5 (five) minutes as needed for chest pain (or tightness). 25 tablet 3   pravastatin (PRAVACHOL) 40 MG tablet Take 1 tablet (40 mg total) by mouth at bedtime. 90 tablet 3   furosemide (LASIX) 20 MG tablet Take 1 tablet (20 mg total) by mouth daily for 5 days only. Hold HCTZ while taking this med and restart this once this med is complete 5 tablet 0   No facility-administered medications prior to visit.    No Known Allergies  Review of Systems Review of  Systems:  A fourteen system review of systems was performed and found to be positive as per HPI.    Objective:    Physical Exam General:  Pleasant and cooperative, in no acute distress  Neuro:  Alert and oriented,  extra-ocular muscles intact  HEENT:  Normocephalic, atraumatic, neck supple Skin:  no gross rash, warm, pink. Cardiac:  RRR, S1 S2 Respiratory: Scattered rhonchi without wheezing, crackles or rales, Not using accessory muscles, speaking in full sentences- unlabored. Vascular:  Ext warm, no cyanosis apprec.; cap RF less 2 sec. Psych:  No HI/SI, judgement and insight good, Euthymic mood. Full Affect.  BP 99/61   Pulse 86   Temp 99.3 F (37.4 C)  Wt Readings from Last 3 Encounters:  05/21/21 224 lb (101.6 kg)  04/22/21 226 lb 12.8 oz (102.9 kg)  02/04/21 231 lb 11.2 oz (105.1 kg)    Health Maintenance Due  Topic Date Due   Zoster Vaccines- Shingrix (1 of 2) Never done   COVID-19 Vaccine (4 - Booster for Pfizer series) 10/26/2020    There are no preventive care reminders to display for this patient.   Lab Results  Component Value Date   TSH 0.886 01/13/2021   Lab Results  Component Value Date   WBC 5.0 05/21/2021   HGB 14.9 05/21/2021   HCT 43.7 05/21/2021   MCV 87.4 05/21/2021   PLT 163 05/21/2021   Lab Results  Component Value Date   NA 139 05/21/2021   K 3.8 05/21/2021   CO2 28 05/21/2021   GLUCOSE 92 05/21/2021   BUN 16 05/21/2021   CREATININE 1.00 05/21/2021   BILITOT 0.7 05/21/2021   ALKPHOS 90 05/21/2021   AST 18 05/21/2021   ALT 18 05/21/2021   PROT 7.0 05/21/2021   ALBUMIN 4.0 05/21/2021   CALCIUM 9.6 05/21/2021   ANIONGAP 9 05/21/2021   EGFR 64 01/13/2021   GFR 92.83 08/11/2016   Lab Results  Component Value Date   CHOL 128 01/13/2021   Lab Results  Component Value Date   HDL 48 01/13/2021   Lab Results  Component Value Date   LDLCALC 54 01/13/2021   Lab Results  Component Value Date   TRIG 150 (H) 01/13/2021   Lab  Results  Component Value Date   CHOLHDL 2.7 01/13/2021   Lab Results  Component Value Date   HGBA1C 5.3 04/24/2019       Assessment & Plan:   Problem List Items Addressed This Visit   None Visit Diagnoses     COVID-19 virus infection    -  Primary   Relevant Medications   molnupiravir EUA 200 mg CAPS      Covid-19 virus infection: -Rapid Covid-19 test performed and positive. Patient has 8 [id number] risk of complications and is within 5 day window of symptom onset so will start antiviral therapy. Patient prefers oral vs infusion so will send rx for molnupiravir. Continue home supportive care. Recommend to monitor for worsening symptoms and instructed to notify the clinic. Vital signs stable. Dicussed latest CDC isolation guidelines.  Meds ordered this encounter  Medications   molnupiravir EUA 200 mg CAPS    Sig: Take 4 capsules (800 mg total) by mouth 2 (two) times daily for 5 days.    Dispense:  40 capsule    Refill:  0    Order Specific Question:   Supervising Provider    Answer:   Beatrice Lecher D [[id number]]     Lorrene Reid, PA-C

## 2021-06-09 NOTE — Telephone Encounter (Signed)
Patient called in stating he had caught COVID and would like to come into office for treatment. I saw where his PCP had changed to [facility]. He has been seen in our office in the past with a former provider who is no longer here. I thought he had changed PCPs' so I told him he could not be treated here. He was frustrated which I understand and hung up and cursed.

## 2021-06-20 ENCOUNTER — Telehealth: Payer: Self-pay

## 2021-06-20 NOTE — Telephone Encounter (Signed)
LVM for pt to call the office to schedule AWV with myself or in person. My available dates are Sat . September 10, 17, 24  form 8-12 and Sunday September  25 from 8-12.   Elyse Jarvis, RMA

## 2021-07-27 ENCOUNTER — Other Ambulatory Visit: Payer: Self-pay

## 2021-07-27 DIAGNOSIS — I25810 Atherosclerosis of coronary artery bypass graft(s) without angina pectoris: Secondary | ICD-10-CM

## 2021-07-27 DIAGNOSIS — E782 Mixed hyperlipidemia: Secondary | ICD-10-CM

## 2021-07-27 MED ORDER — PRAVASTATIN SODIUM 40 MG PO TABS: 40.0000 mg | ORAL_TABLET | Freq: Every day | ORAL | 2 refills | Status: DC

## 2021-08-05 ENCOUNTER — Telehealth: Payer: Self-pay

## 2021-08-05 NOTE — Telephone Encounter (Signed)
This nurse called and spoke with patient and made aware of CT scan and labs being scheduled for 11/7. This nurse informed patient about prep instructions and advised he will need to come and pick up the barium for the exam.  Patient states that he will pick them up tomorrow.  Patient is in agreement with dates and times.  Reminded patient about office visit appointment on 11/10.  No further questions or concerns at this time.

## 2021-08-06 DIAGNOSIS — C44622 Squamous cell carcinoma of skin of right upper limb, including shoulder: Secondary | ICD-10-CM | POA: Diagnosis not present

## 2021-08-06 DIAGNOSIS — Z85828 Personal history of other malignant neoplasm of skin: Secondary | ICD-10-CM | POA: Diagnosis not present

## 2021-08-06 DIAGNOSIS — Z08 Encounter for follow-up examination after completed treatment for malignant neoplasm: Secondary | ICD-10-CM | POA: Diagnosis not present

## 2021-08-14 ENCOUNTER — Ambulatory Visit (HOSPITAL_COMMUNITY): Payer: Medicare HMO

## 2021-08-14 ENCOUNTER — Other Ambulatory Visit: Payer: Medicare HMO

## 2021-08-14 ENCOUNTER — Telehealth: Payer: Self-pay

## 2021-08-14 NOTE — Telephone Encounter (Signed)
Spoke w/pt via telephone regarding his appts scheduled for Monday 08/17/2021 and his appt on 08/19/2021 w/Dr. [name].  Pt called scheduling and cancelled all his appts for Monday which was his CT Scan and Labs.  Pt stated he's not feeling well and would like to follow-up with his PCP regarding his cold before having his CT Scan.  Pt stated he's had a cough that will not go away.  Pt stated he's not taking anything for his cough but has increased the amount of fluids to assist with breaking up the phlegm in his chest.  Pt stated the phlegm is clear in color but sometimes have some green tent to it.  Pt denied having fevers.  Pt stated his last temperature taken on 08/13/2021 was 97.6'F.  Tried to get the pt to reschedule his CT Scan but the pt refused because he wants to follow-up with his PCP first before scheduling another CT Scan.  Pt stated he will reschedule his CT Scan, labs, and follow-up appt with Dr [name] once he meets with his PCP and starts to feel better.

## 2021-08-17 ENCOUNTER — Other Ambulatory Visit: Payer: Medicare HMO

## 2021-08-17 ENCOUNTER — Inpatient Hospital Stay: Payer: Medicare HMO

## 2021-08-17 ENCOUNTER — Ambulatory Visit (HOSPITAL_COMMUNITY): Payer: Medicare HMO

## 2021-08-20 ENCOUNTER — Inpatient Hospital Stay: Payer: Medicare HMO | Admitting: Hematology

## 2021-09-04 ENCOUNTER — Telehealth: Payer: Self-pay

## 2021-09-04 ENCOUNTER — Telehealth: Payer: Self-pay | Admitting: Hematology

## 2021-09-04 NOTE — Telephone Encounter (Signed)
Spoke with patient on the telephone regarding his voicemail message this morning regarding his lab & CT Scan appointments.  Pt stated he was having chest congestion again and wanted to know should he keep his lab & CT Scan appt for Monday 09/07/2021.  Contacted pt to tell him that he needs to keep his lab and CT Scan appt because this is the 2nd time these appts have been rescheduled for the same reason "Chest Congestion".  Pt stated he already canceled his appts scheduled for 09/07/2021.  Reinforced teaching on my CT Scan and labs are important with a Cancer Dx.  Pt verbalized understanding but does NOT demonstrate understanding.  Instructed pt to reschedule his appts for labs and the CT Scan once he feels he is feeling better at which time we will schedule a follow-up appt with Dr. [name] 2 days afterward for scan results.  Pt had no other questions or concerns.

## 2021-09-04 NOTE — Telephone Encounter (Signed)
Patient called to cancel upcoming appointments due to not feeling well. Patient will call back to reschedule.

## 2021-09-05 DIAGNOSIS — J029 Acute pharyngitis, unspecified: Secondary | ICD-10-CM | POA: Diagnosis not present

## 2021-09-05 DIAGNOSIS — I10 Essential (primary) hypertension: Secondary | ICD-10-CM | POA: Diagnosis not present

## 2021-09-05 DIAGNOSIS — Z03818 Encounter for observation for suspected exposure to other biological agents ruled out: Secondary | ICD-10-CM | POA: Diagnosis not present

## 2021-09-05 DIAGNOSIS — B349 Viral infection, unspecified: Secondary | ICD-10-CM | POA: Diagnosis not present

## 2021-09-05 DIAGNOSIS — R059 Cough, unspecified: Secondary | ICD-10-CM | POA: Diagnosis not present

## 2021-09-07 ENCOUNTER — Inpatient Hospital Stay: Payer: Medicare HMO

## 2021-09-07 ENCOUNTER — Ambulatory Visit (HOSPITAL_COMMUNITY): Payer: Medicare HMO

## 2021-09-11 ENCOUNTER — Encounter: Payer: Self-pay | Admitting: Nurse Practitioner

## 2021-09-11 ENCOUNTER — Ambulatory Visit (INDEPENDENT_AMBULATORY_CARE_PROVIDER_SITE_OTHER): Payer: Medicare HMO | Admitting: Nurse Practitioner

## 2021-09-11 VITALS — BP 124/70 | HR 103 | Temp 97.6°F | Ht 71.0 in | Wt 218.0 lb

## 2021-09-11 DIAGNOSIS — J0140 Acute pansinusitis, unspecified: Secondary | ICD-10-CM | POA: Diagnosis not present

## 2021-09-11 MED ORDER — AMOXICILLIN 875 MG PO TABS: 875.0000 mg | ORAL_TABLET | Freq: Two times a day (BID) | ORAL | 0 refills | Status: DC

## 2021-09-11 NOTE — Progress Notes (Signed)
Acute Office Visit  Subjective:    Patient ID: Vincent Mcclure, male    DOB: [birth year], 87 y.o.   MRN: [id number]  Chief Complaint  Patient presents with   Nasal Congestion    The patient states that last Saturday, patient developed cough. Went to clinic which is open on Saturday. Was tested for flu, Covid, and and RSV. All tests were negative. He states that he was not given any medications to help reduce infection.   URI  This is a new problem. The current episode started 1 to 4 weeks ago. The problem has been unchanged. There has been no fever. Associated symptoms include congestion, coughing, headaches, nausea, a plugged ear sensation, rhinorrhea, sinus pain, a sore throat and swollen glands. Pertinent negatives include no chest pain, diarrhea, dysuria, rash, sneezing, vomiting or wheezing. He has tried antihistamine, acetaminophen and NSAIDs for the symptoms. The treatment provided mild relief.    Past Medical History:  Diagnosis Date   Alcohol abuse    Anemia    Arthritis    hands   Blood transfusion without reported diagnosis    CAD (coronary artery disease)    a. NSTEMI 2018 s/p CABG.   CHF (congestive heart failure) ([name])    Chronic low back pain 10/14/2011   colon ca dx'd 05/18/18   colon cancer   Colon polyps    hyperplastic (2004, 2010) and adenomatous (1990).     COLONIC POLYPS, HX OF 03/05/2008   Esophageal stricture    hx of   Gastric ulcer    GERD 03/05/2008   GERD (gastroesophageal reflux disease) 1994   associated peptic strictures   HYPERLIPIDEMIA 03/05/2008   HYPERTENSION 03/05/2008   Hypertension    IBS 03/05/2008   Impaired glucose tolerance 10/12/2011   Iron deficiency anemia    LEG CRAMPS 09/02/2010   Lung cancer ([place]) dx'd 06/2020   Myocardial infarction [facility])    May 2018   PAD (peripheral artery disease) ([place]) 2013   PAF (paroxysmal atrial fibrillation) ([place])    PERIPHERAL EDEMA 09/02/2010   PERIPHERAL NEUROPATHY 03/05/2008   Premature atrial  contraction    Prostatitis    hx of   PVC's (premature ventricular contractions)    Sinus brady-tachy syndrome ([place])    VARICOSE VEINS, LOWER EXTREMITIES 06/09/2009    Past Surgical History:  Procedure Laterality Date   CATARACT EXTRACTION  bilat   COLECTOMY     COLONOSCOPY     CORONARY ARTERY BYPASS GRAFT N/A 02/15/2017   Procedure: CORONARY ARTERY BYPASS GRAFTING times three  with left internal mammary harvest and endoscopic harvest of Right SVG. Grafts of LIMA to  LAD, SVG to Distal Circ, and to First Diag.;  Surgeon: Grace Isaac, MD;  Location: [place];  Service: Open Heart Surgery;  Laterality: N/A;   ESOPHAGOGASTRODUODENOSCOPY N/A 11/14/2014   Procedure: ESOPHAGOGASTRODUODENOSCOPY (EGD);  Surgeon: Jerene Bears, MD;  Location: [facility] ENDOSCOPY;  Service: Endoscopy;  Laterality: N/A;   IR RADIOLOGIST EVAL & MGMT  07/04/2018   IR RADIOLOGIST EVAL & MGMT  07/18/2018   LAPAROTOMY N/A 05/19/2018   Procedure: EXPLORATORY LAPAROTOMY RIGHT COLECTOMY;  Surgeon: Fanny Skates, MD;  [facility];  Service: General;  Laterality: N/A;   LEFT HEART CATH AND CORONARY ANGIOGRAPHY N/A 02/14/2017   Procedure: Left Heart Cath and Coronary Angiography;  Surgeon: Belva Crome, MD;  Location: [place] CV LAB;  Service: Cardiovascular;  Laterality: N/A;   LUMBAR SPINE SURGERY  11/2008   Dr Joya Salm  TEE WITHOUT CARDIOVERSION N/A 02/15/2017   Procedure: TRANSESOPHAGEAL ECHOCARDIOGRAM (TEE);  Surgeon: Grace Isaac, MD;  Location: [place];  Service: Open Heart Surgery;  Laterality: N/A;   UPPER GASTROINTESTINAL ENDOSCOPY      Family History  Problem Relation Age of Onset   Cancer Mother        Brain Cancer   Diabetes Father    Heart disease Father        CAD   Hypertension Father    Stroke Father    Diabetes Sister    Stomach cancer Neg Hx    Pancreatic cancer Neg Hx    Colon cancer Neg Hx    Esophageal cancer Neg Hx    Rectal cancer Neg Hx     Social History   Socioeconomic History    Marital status: Married    Spouse name: Not on file   Number of children: 4   Years of education: Not on file   Highest education level: Not on file  Occupational History   Occupation: Retired Engineer, maintenance  Tobacco Use   Smoking status: Former    Packs/day: 1.25    Years: 50.00    Pack years: 62.50    Types: Cigarettes    Quit date: 11/11/2008    Years since quitting: 12.8   Smokeless tobacco: Former    Types: Chew   Tobacco comments:    smoked off and on for years  Vaping Use   Vaping Use: Never used  Substance and Sexual Activity   Alcohol use: Yes    Alcohol/week: 6.0 standard drinks    Types: 2 Glasses of wine, 4 Cans of beer per week    Comment: vodka   Drug use: No   Sexual activity: Never  Other Topics Concern   Not on file  Social History Narrative   ** Merged History Encounter **       4 sons   Social Determinants of Radio broadcast assistant Strain: Not on file  Food Insecurity: Not on file  Transportation Needs: Not on file  Physical Activity: Not on file  Stress: Not on file  Social Connections: Not on file  Intimate Partner Violence: Not on file    Outpatient Medications Prior to Visit  Medication Sig Dispense Refill   acetaminophen (TYLENOL) 500 MG tablet Take 1 tablet (500 mg total) by mouth every 6 (six) hours as needed for mild pain.  0   apixaban (ELIQUIS) 5 MG TABS tablet Take 1 tablet (5 mg total) by mouth 2 (two) times daily. 60 tablet 11   bacitracin 500 UNIT/GM ointment Apply 1 application topically daily as needed for wound care.     Cholecalciferol (VITAMIN D-3 PO) Take 2 capsules by mouth daily.      hydrochlorothiazide (HYDRODIURIL) 25 MG tablet Take 1 tablet by mouth once daily 90 tablet 3   metoprolol succinate (TOPROL XL) 25 MG 24 hr tablet Take 1 tablet (25 mg total) by mouth daily. 90 tablet 1   nitroGLYCERIN (NITROSTAT) 0.4 MG SL tablet Place 1 tablet (0.4 mg total) under the tongue every 5 (five) minutes as needed for chest  pain (or tightness). 25 tablet 3   pravastatin (PRAVACHOL) 40 MG tablet Take 1 tablet (40 mg total) by mouth at bedtime. 90 tablet 2   No facility-administered medications prior to visit.    No Known Allergies  Review of Systems  Constitutional:  Positive for fatigue. Negative for activity change, chills and fever.  HENT:  Positive for congestion, postnasal drip, rhinorrhea, sinus pressure, sinus pain and sore throat. Negative for sneezing.   Eyes: Negative.   Respiratory:  Positive for cough. Negative for shortness of breath and wheezing.   Cardiovascular:  Negative for chest pain and palpitations.  Gastrointestinal:  Positive for nausea. Negative for constipation, diarrhea and vomiting.  Endocrine: Negative for cold intolerance, heat intolerance, polydipsia and polyuria.  Genitourinary:  Negative for dysuria, frequency and urgency.  Musculoskeletal:  Positive for myalgias. Negative for back pain.  Skin:  Negative for rash.  Allergic/Immunologic: Negative for environmental allergies.  Neurological:  Positive for headaches. Negative for dizziness and weakness.  Hematological:  Positive for adenopathy.  Psychiatric/Behavioral:  The patient is not nervous/anxious.       Objective:    Physical Exam Vitals and nursing note reviewed.  Constitutional:      Appearance: Normal appearance. He is well-developed. He is ill-appearing.  HENT:     Head: Normocephalic and atraumatic.     Right Ear: Tympanic membrane is erythematous and bulging.     Left Ear: Tympanic membrane is erythematous and bulging.     Nose: Congestion present.     Right Sinus: Frontal sinus tenderness present.     Left Sinus: Frontal sinus tenderness present.  Eyes:     Pupils: Pupils are equal, round, and reactive to light.  Cardiovascular:     Rate and Rhythm: Normal rate and regular rhythm.     Pulses: Normal pulses.     Heart sounds: Normal heart sounds.  Pulmonary:     Effort: Pulmonary effort is normal.      Breath sounds: Normal breath sounds.  Abdominal:     Palpations: Abdomen is soft.  Musculoskeletal:        General: Normal range of motion.     Cervical back: Normal range of motion and neck supple.  Lymphadenopathy:     Cervical: Cervical adenopathy present.  Skin:    General: Skin is warm and dry.     Capillary Refill: Capillary refill takes less than 2 seconds.  Neurological:     General: No focal deficit present.     Mental Status: He is alert and oriented to person, place, and time.  Psychiatric:        Mood and Affect: Mood normal.        Behavior: Behavior normal.        Thought Content: Thought content normal.        Judgment: Judgment normal.    Today's Vitals   09/11/21 1109  BP: 124/70  Pulse: (!) 103  Temp: 97.6 F (36.4 C)  SpO2: 96%  Weight: 218 lb (98.9 kg)  Height: _0  (1.803 m)   Body mass index is 30.4 kg/m.   Wt Readings from Last 3 Encounters:  09/11/21 218 lb (98.9 kg)  05/21/21 224 lb (101.6 kg)  04/22/21 226 lb 12.8 oz (102.9 kg)    Health Maintenance Due  Topic Date Due   Pneumonia Vaccine 42+ Years old (1 - PCV) Never done   Zoster Vaccines- Shingrix (1 of 2) Never done   COVID-19 Vaccine (4 - Booster for Pfizer series) 09/20/2020    There are no preventive care reminders to display for this patient.   Lab Results  Component Value Date   TSH 0.886 01/13/2021   Lab Results  Component Value Date   WBC 5.0 05/21/2021   HGB 14.9 05/21/2021   HCT 43.7 05/21/2021   MCV 87.4 05/21/2021  PLT 163 05/21/2021   Lab Results  Component Value Date   NA 139 05/21/2021   K 3.8 05/21/2021   CO2 28 05/21/2021   GLUCOSE 92 05/21/2021   BUN 16 05/21/2021   CREATININE 1.00 05/21/2021   BILITOT 0.7 05/21/2021   ALKPHOS 90 05/21/2021   AST 18 05/21/2021   ALT 18 05/21/2021   PROT 7.0 05/21/2021   ALBUMIN 4.0 05/21/2021   CALCIUM 9.6 05/21/2021   ANIONGAP 9 05/21/2021   EGFR 64 01/13/2021   GFR 92.83 08/11/2016   Lab Results   Component Value Date   CHOL 128 01/13/2021   Lab Results  Component Value Date   HDL 48 01/13/2021   Lab Results  Component Value Date   LDLCALC 54 01/13/2021   Lab Results  Component Value Date   TRIG 150 (H) 01/13/2021   Lab Results  Component Value Date   CHOLHDL 2.7 01/13/2021   Lab Results  Component Value Date   HGBA1C 5.3 04/24/2019       Assessment & Plan:  1. Acute non-recurrent pansinusitis Patient negative for RSV, flu, and COVID-19 on 09/05/2021.  Symptoms are unchanged since then.  Start amoxicillin 875 mg twice daily for next 7 days. Rest and increase fluids. Continue using OTC medication to control symptoms.   - amoxicillin (AMOXIL) 875 MG tablet; Take 1 tablet (875 mg total) by mouth 2 (two) times daily.  Dispense: 14 tablet; Refill: 0    Problem List Items Addressed This Visit       Respiratory   Acute non-recurrent pansinusitis - Primary   Relevant Medications   amoxicillin (AMOXIL) 875 MG tablet     Meds ordered this encounter  Medications   amoxicillin (AMOXIL) 875 MG tablet    Sig: Take 1 tablet (875 mg total) by mouth 2 (two) times daily.    Dispense:  14 tablet    Refill:  0    Order Specific Question:   Supervising Provider    Answer:   Beatrice Lecher D [[id number]]     Ronnell Freshwater, NP  This note was dictated using Dragon Voice Recognition Software. Rapid proofreading was performed to expedite the delivery of the information. Despite proofreading, phonetic errors will occur which are common with this voice recognition software. Please take this into consideration. If there are any concerns, please contact our office.

## 2021-09-15 ENCOUNTER — Telehealth: Payer: Self-pay | Admitting: Hematology

## 2021-09-15 ENCOUNTER — Inpatient Hospital Stay: Payer: Medicare HMO | Admitting: Hematology

## 2021-09-15 NOTE — Telephone Encounter (Signed)
Patient called to schedule lab and follow-up for upcoming CT scan. Patient is aware of appointments.

## 2021-09-20 DIAGNOSIS — J0140 Acute pansinusitis, unspecified: Secondary | ICD-10-CM | POA: Insufficient documentation

## 2021-09-28 ENCOUNTER — Other Ambulatory Visit: Payer: Self-pay

## 2021-09-28 ENCOUNTER — Encounter (HOSPITAL_COMMUNITY): Payer: Self-pay

## 2021-09-28 ENCOUNTER — Ambulatory Visit (HOSPITAL_COMMUNITY)
Admission: RE | Admit: 2021-09-28 | Discharge: 2021-09-28 | Disposition: A | Discharge status: Home / Self Care | Payer: Medicare HMO | Source: Ambulatory Visit | Attending: Hematology | Admitting: Hematology

## 2021-09-28 ENCOUNTER — Inpatient Hospital Stay: Payer: Medicare HMO | Attending: Hematology

## 2021-09-28 DIAGNOSIS — Z951 Presence of aortocoronary bypass graft: Secondary | ICD-10-CM | POA: Insufficient documentation

## 2021-09-28 DIAGNOSIS — Z7901 Long term (current) use of anticoagulants: Secondary | ICD-10-CM | POA: Insufficient documentation

## 2021-09-28 DIAGNOSIS — C182 Malignant neoplasm of ascending colon: Secondary | ICD-10-CM | POA: Insufficient documentation

## 2021-09-28 DIAGNOSIS — C3491 Malignant neoplasm of unspecified part of right bronchus or lung: Secondary | ICD-10-CM

## 2021-09-28 DIAGNOSIS — D3500 Benign neoplasm of unspecified adrenal gland: Secondary | ICD-10-CM | POA: Diagnosis not present

## 2021-09-28 DIAGNOSIS — R220 Localized swelling, mass and lump, head: Secondary | ICD-10-CM | POA: Insufficient documentation

## 2021-09-28 DIAGNOSIS — Z85118 Personal history of other malignant neoplasm of bronchus and lung: Secondary | ICD-10-CM | POA: Insufficient documentation

## 2021-09-28 DIAGNOSIS — C3431 Malignant neoplasm of lower lobe, right bronchus or lung: Secondary | ICD-10-CM | POA: Insufficient documentation

## 2021-09-28 DIAGNOSIS — J8410 Pulmonary fibrosis, unspecified: Secondary | ICD-10-CM | POA: Diagnosis not present

## 2021-09-28 DIAGNOSIS — C189 Malignant neoplasm of colon, unspecified: Secondary | ICD-10-CM | POA: Diagnosis not present

## 2021-09-28 DIAGNOSIS — Z85038 Personal history of other malignant neoplasm of large intestine: Secondary | ICD-10-CM | POA: Insufficient documentation

## 2021-09-28 DIAGNOSIS — C3490 Malignant neoplasm of unspecified part of unspecified bronchus or lung: Secondary | ICD-10-CM | POA: Diagnosis not present

## 2021-09-28 DIAGNOSIS — C19 Malignant neoplasm of rectosigmoid junction: Secondary | ICD-10-CM | POA: Diagnosis not present

## 2021-09-28 DIAGNOSIS — I2510 Atherosclerotic heart disease of native coronary artery without angina pectoris: Secondary | ICD-10-CM | POA: Insufficient documentation

## 2021-09-28 DIAGNOSIS — D7389 Other diseases of spleen: Secondary | ICD-10-CM | POA: Diagnosis not present

## 2021-09-28 DIAGNOSIS — J9811 Atelectasis: Secondary | ICD-10-CM | POA: Diagnosis not present

## 2021-09-28 LAB — CMP (CANCER CENTER ONLY)
ALT: 10 U/L (ref 0–44)
AST: 14 U/L — ABNORMAL LOW (ref 15–41)
Albumin: 4 g/dL (ref 3.5–5.0)
Alkaline Phosphatase: 77 U/L (ref 38–126)
Anion gap: 10 (ref 5–15)
BUN: 15 mg/dL (ref 8–23)
CO2: 28 mmol/L (ref 22–32)
Calcium: 9.2 mg/dL (ref 8.9–10.3)
Chloride: 98 mmol/L (ref 98–111)
Creatinine: 1.03 mg/dL (ref 0.61–1.24)
GFR, Estimated: >60 mL/min (ref >60)
Glucose, Bld: 99 mg/dL (ref 70–99)
Potassium: 3.6 mmol/L (ref 3.5–5.1)
Sodium: 136 mmol/L (ref 135–145)
Total Bilirubin: 0.7 mg/dL (ref 0.3–1.2)
Total Protein: 7.1 g/dL (ref 6.5–8.1)

## 2021-09-28 LAB — CBC WITH DIFFERENTIAL (CANCER CENTER ONLY)
Abs Immature Granulocytes: 0.02 10*3/uL (ref 0.00–0.07)
Basophils Absolute: 0 10*3/uL (ref 0.0–0.1)
Basophils Relative: 0 %
Eosinophils Absolute: 0.1 10*3/uL (ref 0.0–0.5)
Eosinophils Relative: 1 %
HCT: 43.7 % (ref 39.0–52.0)
Hemoglobin: 14.4 g/dL (ref 13.0–17.0)
Immature Granulocytes: 0 %
Lymphocytes Relative: 21 %
Lymphs Abs: 1.2 10*3/uL (ref 0.7–4.0)
MCH: 29.1 pg (ref 26.0–34.0)
MCHC: 33 g/dL (ref 30.0–36.0)
MCV: 88.3 fL (ref 80.0–100.0)
Monocytes Absolute: 0.5 10*3/uL (ref 0.1–1.0)
Monocytes Relative: 10 %
Neutro Abs: 3.8 10*3/uL (ref 1.7–7.7)
Neutrophils Relative %: 68 %
Platelet Count: 214 10*3/uL (ref 150–400)
RBC: 4.95 MIL/uL (ref 4.22–5.81)
RDW: 13.5 % (ref 11.5–15.5)
WBC Count: 5.6 10*3/uL (ref 4.0–10.5)
nRBC: 0 % (ref 0.0–0.2)

## 2021-09-28 LAB — CEA (IN HOUSE-CHCC): CEA (CHCC-In House): 2.12 ng/mL (ref 0.00–5.00)

## 2021-09-28 MED ORDER — SODIUM CHLORIDE (PF) 0.9 % IJ SOLN
INTRAMUSCULAR | Status: AC
  Filled 2021-09-28: qty 50

## 2021-09-28 MED ORDER — IOHEXOL 350 MG/ML SOLN
75.0000 mL | Freq: Once | INTRAVENOUS | Status: AC | PRN
  Administered 2021-09-28: 13:00:00 75 mL via INTRAVENOUS

## 2021-10-01 ENCOUNTER — Other Ambulatory Visit: Payer: Self-pay

## 2021-10-01 ENCOUNTER — Inpatient Hospital Stay: Payer: Medicare HMO | Admitting: Hematology

## 2021-10-01 ENCOUNTER — Encounter: Payer: Self-pay | Admitting: Hematology

## 2021-10-01 VITALS — BP 119/67 | HR 83 | Temp 98.2°F | Resp 20 | Ht 71.0 in | Wt 214.5 lb

## 2021-10-01 DIAGNOSIS — C3431 Malignant neoplasm of lower lobe, right bronchus or lung: Secondary | ICD-10-CM | POA: Diagnosis not present

## 2021-10-01 DIAGNOSIS — C182 Malignant neoplasm of ascending colon: Secondary | ICD-10-CM | POA: Diagnosis not present

## 2021-10-01 NOTE — Progress Notes (Signed)
[place]   [phone] Fax:(336) [phone]   Clinic Follow up Note   Patient Care Team: Program, [facility] as PCP - General Lovena Le Champ Mungo, MD as PCP - Electrophysiology (Cardiology) Pyrtle, Lajuan Lines, MD as Consulting Physician (Gastroenterology) Allyn Kenner, MD as Consulting Physician (Dermatology) Dorothy Spark, MD (Inactive) as Consulting Physician (Cardiology) Levin Erp, [place] as Physician Assistant (Gastroenterology) Murrell Converse as Physician Assistant (Cardiology)  Date of Service:  10/01/2021  CHIEF COMPLAINT: f/u of colon and lung cancers  CURRENT THERAPY:  Surveillance  ASSESSMENT & PLAN:  HAGER COMPSTON is a 87 y.o. male with   1. Non-small cell lung cancer in right lower lobe, squamous cell carcinoma, cT1bN0M0 stage IA -This was discovered on his 05/30/20 surveillance CT scan for colon cancer, the lesion was hypermetabolic on 02/16/08 PET, no other distant metastasis. -His 07/02/20 fine-needle biopsy of the lung mass showed squamous cell carcinoma. He had a history of heavy smoking. -He was treated with SBRT to lung 07/30/20-08/08/20 with Dr Lisbeth Renshaw. He is now on surveillance.  -CT CAP 09/28/21 showed radiation changes, and possible infectious bronchiolitis. He notes he had Covid several months ago, which is likely related. I reviewed the results with him today. -He is clinically doing well from a cancer standpoint. Labs reviewed, all WNL. There is no clinical concern for recurrence.  -Continue surveillance.  -F/u in 8 months    2. Adenocarcinoma of right colon, invasive adenocarcinoma, stage IIIB (pT4a, pN1, cM0), MSS -Diagnosed in 05/2018. Treated with hemicolectomy. He received adjuvant Xeloda 2000 mg BID one week on, one week off from 07/31/2018 to 01/29/2019.  -He is clinically doing well, no evidence of recurrence so far. Latest CT CAP from 09/28/21 showed only post-Covid and post-radiation  changes.    3. Left maxillary sinus mass -He was found to have a hypermetabolic left maxillary sinus lesion. His CT [place] on 08/05/20 showed 80mm mass in the left nasal lacrimal duct. It has slightly enlarged since 2016 -He was seen by Dr Lucia Gaskins who removed what he could in late 2021. There was remaining mass in left maxillary sinus. He was seen by Specialist Dr Lorina Rabon who ordered MRI. His 09/08/20 MRI showed mass is still present in left lacrimal duct mass with obstruction.  -He will continue to f/u with Dr Lorina Rabon    4. CAD S/P CABG, Afib  -F/u with Dr Raliegh Scarlet and Dr Meda Coffee. Continue on medications -He had episode of Afib in 07/2020. S/p heart monitor, he was started on Eliquis in 11/2020, given tolerance issue he only takes half tablet BID.  -He does have mild cough which he attributes to COPD.   -Echo on 05/14/21 showed prominent crista terminalis, no evidence of mass.     Plan -Lab and f/u in 8 months   No problem-specific Assessment & Plan notes found for this encounter.   SUMMARY OF ONCOLOGIC HISTORY: Oncology History Overview Note  Cancer Staging Cancer of ascending colon pT4apN1 s/p right colectomy 05/19/2018 Staging form: Colon and Rectum, AJCC 8th Edition - Pathologic stage from 05/19/2018: Stage IIIB (pT4a, pN1, cM0) - Signed by Truitt Merle, MD on 05/24/2018     Cancer of ascending colon pT4apN1 s/p right colectomy 05/19/2018  05/18/2018 Imaging   05/18/2018 CT AP IMPRESSION: 1. There appears to be a nearly obstructing lesion in the region of the hepatic flexure of the colon which is highly concerning for primary colonic neoplasm. Slight haziness of the surrounding soft  tissues may be indicative of early local invasion. This appears partially obstructive as evidenced by dilatation of more proximal aspects of the small bowel and colon. 2. Left adrenal lesion is stable compared to prior studies, previously characterized as an adenoma. 3. Aortic atherosclerosis, in addition to  left main and 3 vessel coronary artery disease. Status post median sternotomy for CABG. 4. Additional incidental findings, as above.   Aortic Atherosclerosis (ICD10-I70.0).   05/18/2018 - 05/30/2018 Hospital Admission   Admit date: 05/18/2018 Admission diagnosis: Colon Cancer Discharged on: 05/30/2018   05/19/2018 Cancer Staging   Staging form: Colon and Rectum, AJCC 8th Edition - Pathologic stage from 05/19/2018: Stage IIIB (pT4a, pN1, cM0) - Signed by Truitt Merle, MD on 05/24/2018    05/19/2018 Pathology Results   05/19/2018 Surgical Pathology Diagnosis Colon, segmental resection for tumor, right - INVASIVE COLORECTAL ADENOCARCINOMA, 5 CM. - CARCINOMA FOCALLY LESS THAN 0.1 CM FROM SEROSAL SURFACE. - MARGINS NOT INVOLVED. - METASTATIC CARCINOMA IN ONE OF EIGHT LYMPH NODES (1/9). - BENIGN APPENDIX.   05/19/2018 Surgery   EXPLORATORY LAPAROTOMY with right colectomy with Fanny Skates, MD   05/24/2018 Initial Diagnosis   Cancer of right colon ([place])   06/12/2018 Imaging     06/12/2018 CT CAP IMPRESSION: 1. Evidence all bowel perforation/anastomosed breakdown with large volume intraperitoneal free air fluid in the RIGHT upper quadrant adjacent and above the liver. 2. Large loculated fluid collection in the RIGHT anterior pelvis extending along the LEFT pericolic gutter consists with PERITONEAL ABSCESS. 3. Of note, no large volume intraperitoneal free fluid. Oral contrast passed through the RIGHT colon anastomosis without evidence of leak. Findings could indicate a prior anastomotic breakdown which has healed in the interval with subsequent abscess formation from the prior leak / breakdown.   06/17/2018 Procedure   06/17/2018 US Thoracentesis IMPRESSION: Successful ultrasound guided right thoracentesis yielding 1 L of pleural fluid. No pneumothorax on post-procedure chest x-ray.  No malignancy on pathology   06/20/2018 Imaging   06/20/2018 CT AP IMPRESSION: 1. Decompression of right upper  quadrant air and fluid collection after percutaneous catheter drainage. There remains a tiny rim of fluid as well as small extraluminal gas bubbles in this region suspicious for continued leak from bowel. 2. Further decrease in size of residual abscess fluid collection in the pelvis after percutaneous catheter drainage. There remains some fluid around the drainage catheter in the right pelvis.   07/04/2018 Imaging   07/04/2018 CT AP IMPRESSION: 1. Further decompression of right perihepatic abscess with resolution of extraluminal air. 2. Relatively stable volume of fluid surrounding the right pelvic drainage catheter.   07/05/2018 Imaging   07/05/2018 CT AP IMPRESSION: 1. No change compared to the CT scan performed 9 hours earlier. 2. Anterior right perihepatic space collection is decompressed by indwelling percutaneous drain, with no residual measurable collection in this location. 3. Thick walled right lower quadrant collection with indwelling percutaneous drain is stable. No new focal fluid collections. 4. Stable postsurgical changes from right hemicolectomy with no evidence of bowel obstruction. Stable reactive wall thickening in the sigmoid colon adjacent to the right lower quadrant collection. 5. Stable small to moderate dependent right pleural effusion. 6. Stable left adrenal adenoma. 7.  Aortic Atherosclerosis (ICD10-I70.0).   07/31/2018 - 01/21/2019 Chemotherapy   Adjuvant Xeloda 2000mg  bid, 7 days on, 7 days off.  Reduced to 2000mg  in the AM and 1500mg  in the PM due to diarrhea. Completed on 01/21/19    04/12/2019 Imaging   CT CAP W contrast 04/12/19  IMPRESSION: 1. Status post right hemicolectomy. 2. No definite metastatic disease in the chest, abdomen, or pelvis. 7 mm right lower lobe pulmonary nodule, obscured by atelectasis on previous chest CT and not included on multiple prior abdomen / pelvis CTs. Close follow-up recommended to ensure stability. 3.  Aortic  Atherosclerois (ICD10-170.0)   09/04/2019 Imaging   CT AP W Contrast I MPRESSION: 1. No evidence of local colon cancer recurrence at the RIGHT hemicolectomy site. 2. No evidence of metastatic disease in the abdomen pelvis. 3. Mild nodularity at the LEFT lung base is favored benign. Recommend attention on routine surveillance 4. Aortic Atherosclerosis (ICD10-I70.0).   07/02/2020 Relapse/Recurrence   FINAL MICROSCOPIC DIAGNOSIS:   A. LUNG, RIGHT LOWER LOBE, NEEDLE CORE BIOPSY:  - Non-small cell carcinoma, see comment    COMMENT:   Immunohistochemical stains for characterization of the tumor phenotype  are pending and will be reported in an addendum.  Dr. Tresa Moore reviewed the  case and concurs with the diagnosis. Dr. [name] was notified on 07/03/2020.    09/28/2021 Imaging   EXAM: CT CHEST, ABDOMEN, AND PELVIS WITH CONTRAST  IMPRESSION: 1. Further volume loss/consolidation medially in the superior segment left lower lobe probably related to prior radiation therapy. Underlying original lung nodule not readily distinguishable. 2. Suspected mild atypical infectious bronchiolitis in the lingula. Mild atelectasis in the left lower lobe. 3. Other imaging findings of potential clinical significance: Aortic Atherosclerosis (ICD10-I70.0). Coronary atherosclerosis. Systemic atherosclerosis. Multilevel lumbar impingement. Left adrenal adenoma. Right hemicolectomy.   Malignant neoplasm of bronchus of right lower lobe ([place])  05/30/2020 Imaging   CT CAP w contrast IMPRESSION: 1. Significant enlargement of a right lower lobe pulmonary nodule. Suspicious for primary bronchogenic carcinoma. Isolated pulmonary metastasis felt less likely. 2. Otherwise, no evidence of metastatic disease from patient's colon cancer. 3. Aortic atherosclerosis (ICD10-I70.0) and emphysema (ICD10-J43.9). 4. Left adrenal adenoma.   These results will be called to the ordering clinician or representative by the  Radiologist Assistant, and communication documented in the PACS or [employer].   06/11/2020 PET scan   IMPRESSION: 1. The index nodule within the superior segment of the right lower lobe is FDG avid and worrisome for either primary bronchogenic neoplasm versus metastatic disease 2. Within the anteromedial aspect of the left maxillary sinus, in the region of the nasolacrimal duct, there is a focal soft tissue attenuating filling defect which exhibits intense FDG uptake within SUV max of 55.8. The although focal uptake within the paranasal sinuses may be seen with sinusitis primary neoplasm of the sinuses or nasal lacrimal duct cannot be excluded. In light of the intense FDG uptake in this area further investigation with contrast enhanced CT or MRI of the soft tissues of neck to include the maxillary sinuses is recommended. 3.  Aortic Atherosclerosis (ICD10-I70.0). 4. Coronary artery calcifications 5. Prior granulomatous disease.     07/02/2020 Cancer Staging   Staging form: Lung, AJCC 8th Edition - Clinical stage from 07/02/2020: Stage IA2 (cT1b, cN0, cM0) - Signed by Truitt Merle, MD on 07/06/2020    07/06/2020 Initial Diagnosis   Non-small cell lung cancer, right ([place])   07/30/2020 - 08/08/2020 Radiation Therapy   SBRT to lung 07/16/20-08/04/20 with Dr Lisbeth Renshaw.    11/07/2020 Imaging   CT Chest  IMPRESSION: 1. Decreased size of dominant area of nodularity in the RIGHT chest with surrounding post treatment changes. 2. New area of nodularity along the pleural surface along the posterosuperior segment of the RIGHT lower lobe is suspicious but indeterminate  at this time. Could consider short interval follow-up or PET imaging for further assessment as this area would be challenging for tissue sampling. There is also the possibility this area could somehow related to post treatment changes as well, though the morphologic features do raise suspicion. 3. Stable LEFT adrenal adenoma. 4.  Changes of CABG. 5. Aortic atherosclerosis.   Aortic Atherosclerosis (ICD10-I70.0).   09/28/2021 Imaging   EXAM: CT CHEST, ABDOMEN, AND PELVIS WITH CONTRAST  IMPRESSION: 1. Further volume loss/consolidation medially in the superior segment left lower lobe probably related to prior radiation therapy. Underlying original lung nodule not readily distinguishable. 2. Suspected mild atypical infectious bronchiolitis in the lingula. Mild atelectasis in the left lower lobe. 3. Other imaging findings of potential clinical significance: Aortic Atherosclerosis (ICD10-I70.0). Coronary atherosclerosis. Systemic atherosclerosis. Multilevel lumbar impingement. Left adrenal adenoma. Right hemicolectomy.      INTERVAL HISTORY:  Vincent Mcclure is here for a follow up of colon and lung cancers. He was last seen by me on 05/21/21. He presents to the clinic alone. He reports he is feeling "gooder." He also reports he had Covid a few months ago and notes he didn't have very severe symptoms. He notes he took a prescription medication for it, felt worse for a few days, then began to feel better. He reports he feels he hasn't recovered fully from the colon cancer and has weakness in his legs. He told me about how he started smoking at age 57 (during WWII), how he stole a "[employer]" brand cigarette and "took a puff; I thought I was hot stuff." He tells me cigarettes were his "downfall" at age 38.   All other systems were reviewed with the patient and are negative.  MEDICAL HISTORY:  Past Medical History:  Diagnosis Date   Alcohol abuse    Anemia    Arthritis    hands   Blood transfusion without reported diagnosis    CAD (coronary artery disease)    a. NSTEMI 2018 s/p CABG.   CHF (congestive heart failure) ([name])    Chronic low back pain 10/14/2011   colon ca dx'd 05/18/18   colon cancer   Colon polyps    hyperplastic (2004, 2010) and adenomatous (1990).     COLONIC POLYPS, HX OF 03/05/2008   Esophageal  stricture    hx of   Gastric ulcer    GERD 03/05/2008   GERD (gastroesophageal reflux disease) 1994   associated peptic strictures   HYPERLIPIDEMIA 03/05/2008   HYPERTENSION 03/05/2008   Hypertension    IBS 03/05/2008   Impaired glucose tolerance 10/12/2011   Iron deficiency anemia    LEG CRAMPS 09/02/2010   Lung cancer ([place]) dx'd 06/2020   Myocardial infarction [facility])    May 2018   PAD (peripheral artery disease) ([place]) 2013   PAF (paroxysmal atrial fibrillation) ([place])    PERIPHERAL EDEMA 09/02/2010   PERIPHERAL NEUROPATHY 03/05/2008   Premature atrial contraction    Prostatitis    hx of   PVC's (premature ventricular contractions)    Sinus brady-tachy syndrome ([place])    VARICOSE VEINS, LOWER EXTREMITIES 06/09/2009    SURGICAL HISTORY: Past Surgical History:  Procedure Laterality Date   CATARACT EXTRACTION  bilat   COLECTOMY     COLONOSCOPY     CORONARY ARTERY BYPASS GRAFT N/A 02/15/2017   Procedure: CORONARY ARTERY BYPASS GRAFTING times three  with left internal mammary harvest and endoscopic harvest of Right SVG. Grafts of LIMA to  LAD, SVG to Distal Circ, and  to First Diag.;  Surgeon: Grace Isaac, MD;  Location: [place];  Service: Open Heart Surgery;  Laterality: N/A;   ESOPHAGOGASTRODUODENOSCOPY N/A 11/14/2014   Procedure: ESOPHAGOGASTRODUODENOSCOPY (EGD);  Surgeon: Jerene Bears, MD;  Location[facility];  Service: Endoscopy;  Laterality: N/A;   IR RADIOLOGIST EVAL & MGMT  07/04/2018   IR RADIOLOGIST EVAL & MGMT  07/18/2018   LAPAROTOMY N/A 05/19/2018   Procedure: EXPLORATORY LAPAROTOMY RIGHT COLECTOMY;  Surgeon: Fanny Skates, MD;  Location: [facility] OR[facility];  Service: General;  Laterality: N/A;   LEFT HEART CATH AND CORONARY ANGIOGRAPHY N/A 02/14/2017   Procedure: Left Heart Cath and Coronary Angiography;  Surgeon: Belva Crome, MD;  Location: [place] CV LAB;  Service: Cardiovascular;  Laterality: N/A;   LUMBAR SPINE SURGERY  11/2008   Dr Joya Salm   TEE WITHOUT CARDIOVERSION N/A  02/15/2017   Procedure: TRANSESOPHAGEAL ECHOCARDIOGRAM (TEE);  Surgeon: Grace Isaac, MD;  Location: [place];  Service: Open Heart Surgery;  Laterality: N/A;   UPPER GASTROINTESTINAL ENDOSCOPY      I have reviewed the social history and family history with the patient and they are unchanged from previous note.  ALLERGIES:  has No Known Allergies.  MEDICATIONS:  Current Outpatient Medications  Medication Sig Dispense Refill   acetaminophen (TYLENOL) 500 MG tablet Take 1 tablet (500 mg total) by mouth every 6 (six) hours as needed for mild pain.  0   amoxicillin (AMOXIL) 875 MG tablet Take 1 tablet (875 mg total) by mouth 2 (two) times daily. 14 tablet 0   apixaban (ELIQUIS) 5 MG TABS tablet Take 1 tablet (5 mg total) by mouth 2 (two) times daily. 60 tablet 11   bacitracin 500 UNIT/GM ointment Apply 1 application topically daily as needed for wound care.     Cholecalciferol (VITAMIN D-3 PO) Take 2 capsules by mouth daily.      hydrochlorothiazide (HYDRODIURIL) 25 MG tablet Take 1 tablet by mouth once daily 90 tablet 3   metoprolol succinate (TOPROL XL) 25 MG 24 hr tablet Take 1 tablet (25 mg total) by mouth daily. 90 tablet 1   nitroGLYCERIN (NITROSTAT) 0.4 MG SL tablet Place 1 tablet (0.4 mg total) under the tongue every 5 (five) minutes as needed for chest pain (or tightness). 25 tablet 3   pravastatin (PRAVACHOL) 40 MG tablet Take 1 tablet (40 mg total) by mouth at bedtime. 90 tablet 2   No current facility-administered medications for this visit.    PHYSICAL EXAMINATION: ECOG PERFORMANCE STATUS: 2 - Symptomatic, <50% confined to bed  Vitals:   10/01/21 0930  BP: 119/67  Pulse: 83  Resp: 20  Temp: 98.2 F (36.8 C)  SpO2: 98%   Wt Readings from Last 3 Encounters:  10/01/21 214 lb 8 oz (97.3 kg)  09/11/21 218 lb (98.9 kg)  05/21/21 224 lb (101.6 kg)     GENERAL:alert, no distress and comfortable SKIN: skin color normal, no rashes or significant lesions EYES: normal,  Conjunctiva are pink and non-injected, sclera clear  NEURO: alert & oriented x 3 with fluent speech  LABORATORY DATA:  I have reviewed the data as listed CBC Latest Ref Rng & Units 09/28/2021 05/21/2021 02/02/2021  WBC 4.0 - 10.5 K/uL 5.6 5.0 4.9  Hemoglobin 13.0 - 17.0 g/dL 14.4 14.9 15.2  Hematocrit 39.0 - 52.0 % 43.7 43.7 45.0  Platelets 150 - 400 K/uL 214 163 148(L)     CMP Latest Ref Rng & Units 09/28/2021 05/21/2021 02/02/2021  Glucose 70 -  99 mg/dL 99 92 98  BUN 8 - 23 mg/dL 15 16 24(H)  Creatinine 0.61 - 1.24 mg/dL 1.03 1.00 1.03  Sodium 135 - 145 mmol/L 136 139 140  Potassium 3.5 - 5.1 mmol/L 3.6 3.8 3.7  Chloride 98 - 111 mmol/L 98 102 103  CO2 22 - 32 mmol/L 28 28 25   Calcium 8.9 - 10.3 mg/dL 9.2 9.6 9.6  Total Protein 6.5 - 8.1 g/dL 7.1 7.0 6.9  Total Bilirubin 0.3 - 1.2 mg/dL 0.7 0.7 0.7  Alkaline Phos 38 - 126 U/L 77 90 65  AST 15 - 41 U/L 14(L) 18 17  ALT 0 - 44 U/L 10 18 13       RADIOGRAPHIC STUDIES: I have personally reviewed the radiological images as listed and agreed with the findings in the report. No results found.    No orders of the defined types were placed in this encounter.  All questions were answered. The patient knows to call the clinic with any problems, questions or concerns. No barriers to learning was detected. The total time spent in the appointment was 30 minutes.     Truitt Merle, MD 10/01/2021   I, Wilburn Mylar, am acting as scribe for Truitt Merle, MD.   I have reviewed the above documentation for accuracy and completeness, and I agree with the above.

## 2021-10-16 ENCOUNTER — Telehealth: Payer: Self-pay | Admitting: Hematology

## 2021-10-16 NOTE — Telephone Encounter (Signed)
Sch per 1/5, pt aware

## 2021-11-30 ENCOUNTER — Other Ambulatory Visit: Payer: Self-pay

## 2021-11-30 DIAGNOSIS — I209 Angina pectoris, unspecified: Secondary | ICD-10-CM

## 2021-11-30 DIAGNOSIS — I214 Non-ST elevation (NSTEMI) myocardial infarction: Secondary | ICD-10-CM

## 2021-11-30 DIAGNOSIS — I2510 Atherosclerotic heart disease of native coronary artery without angina pectoris: Secondary | ICD-10-CM

## 2021-11-30 DIAGNOSIS — I25810 Atherosclerosis of coronary artery bypass graft(s) without angina pectoris: Secondary | ICD-10-CM

## 2021-11-30 MED ORDER — HYDROCHLOROTHIAZIDE 25 MG PO TABS: 25.0000 mg | ORAL_TABLET | Freq: Every day | ORAL | 1 refills | Status: DC

## 2021-12-21 ENCOUNTER — Other Ambulatory Visit: Payer: Self-pay

## 2021-12-21 DIAGNOSIS — I4891 Unspecified atrial fibrillation: Secondary | ICD-10-CM

## 2021-12-21 DIAGNOSIS — R001 Bradycardia, unspecified: Secondary | ICD-10-CM

## 2021-12-21 DIAGNOSIS — R9431 Abnormal electrocardiogram [ECG] [EKG]: Secondary | ICD-10-CM

## 2021-12-21 DIAGNOSIS — I495 Sick sinus syndrome: Secondary | ICD-10-CM

## 2021-12-21 MED ORDER — APIXABAN 5 MG PO TABS: 5.0000 mg | ORAL_TABLET | Freq: Two times a day (BID) | ORAL | 5 refills | Status: DC

## 2021-12-21 NOTE — Telephone Encounter (Signed)
Pt last saw Dr Johney Frame 04/22/21, last labs 09/28/21 Creat 1.03, age 88, weight 97.3kg, based on specified criteria pt is on appropriate dosage of Eliquis 5mg  BID for afib.  Will refill rx.  ?

## 2022-02-03 DIAGNOSIS — L570 Actinic keratosis: Secondary | ICD-10-CM | POA: Diagnosis not present

## 2022-02-03 DIAGNOSIS — X32XXXD Exposure to sunlight, subsequent encounter: Secondary | ICD-10-CM | POA: Diagnosis not present

## 2022-02-06 NOTE — Progress Notes (Deleted)
?Cardiology Office Note:   ? ?Date:  02/06/2022  ? ?ID:  Vincent Mcclure, DOB 12-16-1931, MRN [id number] ? ?PCP:  [facility], [facility] [place] [facility] ?  ?[facility] Providers ?Cardiologist:  None ?Cardiology APP:  Imogene Burn, PA-C  ?Electrophysiologist:  Cristopher Peru, MD { ?Click to update primary MD,subspecialty MD or APP then REFRESH:1}   ? ?Referring MD: Program, [place]  ? ?No chief complaint on file. ?*** ? ?History of Present Illness:   ? ?Vincent Mcclure is a 88 y.o. male with a hx of *** ? ?Past Medical History:  ?Diagnosis Date  ? Alcohol abuse   ? Anemia   ? Arthritis   ? hands  ? Blood transfusion without reported diagnosis   ? CAD (coronary artery disease)   ? a. NSTEMI 2018 s/p CABG.  ? CHF (congestive heart failure) ([place])   ? Chronic low back pain 10/14/2011  ? colon ca dx'd 05/18/18  ? colon cancer  ? Colon polyps   ? hyperplastic (2004, 2010) and adenomatous (1990).    ? COLONIC POLYPS, HX OF 03/05/2008  ? Esophageal stricture   ? hx of  ? Gastric ulcer   ? GERD 03/05/2008  ? GERD (gastroesophageal reflux disease) 1994  ? associated peptic strictures  ? HYPERLIPIDEMIA 03/05/2008  ? HYPERTENSION 03/05/2008  ? Hypertension   ? IBS 03/05/2008  ? Impaired glucose tolerance 10/12/2011  ? Iron deficiency anemia   ? LEG CRAMPS 09/02/2010  ? Lung cancer ([place]) dx'd 06/2020  ? Myocardial infarction [facility])   ? May 2018  ? PAD (peripheral artery disease) ([place]) 2013  ? PAF (paroxysmal atrial fibrillation) ([place])   ? PERIPHERAL EDEMA 09/02/2010  ? PERIPHERAL NEUROPATHY 03/05/2008  ? Premature atrial contraction   ? Prostatitis   ? hx of  ? PVC's (premature ventricular contractions)   ? Sinus brady-tachy syndrome ([place])   ? VARICOSE VEINS, LOWER EXTREMITIES 06/09/2009  ? ? ?Past Surgical History:  ?Procedure Laterality Date  ? CATARACT EXTRACTION  bilat  ? COLECTOMY    ? COLONOSCOPY    ? CORONARY ARTERY BYPASS GRAFT N/A 02/15/2017  ? Procedure: CORONARY ARTERY BYPASS GRAFTING times three  with left internal  mammary harvest and endoscopic harvest of Right SVG. Grafts of LIMA to  LAD, SVG to Distal Circ, and to First Diag.;  Surgeon: Grace Isaac, MD;  Location: [place];  Service: Open Heart Surgery;  Laterality: N/A;  ? ESOPHAGOGASTRODUODENOSCOPY N/A 11/14/2014  ? Procedure: ESOPHAGOGASTRODUODENOSCOPY (EGD);  Surgeon: Jerene Bears, MD;  Location: [facility] ENDOSCOPY;  Service: Endoscopy;  Laterality: N/A;  ? IR RADIOLOGIST EVAL & MGMT  07/04/2018  ? IR RADIOLOGIST EVAL & MGMT  07/18/2018  ? LAPAROTOMY N/A 05/19/2018  ? Procedure: EXPLORATORY LAPAROTOMY RIGHT COLECTOMY;  Surgeon: Fanny Skates, MD;  [facility];  Service: General;  Laterality: N/A;  ? LEFT HEART CATH AND CORONARY ANGIOGRAPHY N/A 02/14/2017  ? Procedure: Left Heart Cath and Coronary Angiography;  Surgeon: Belva Crome, MD;  Location: [place] CV LAB;  Service: Cardiovascular;  Laterality: N/A;  ? LUMBAR SPINE SURGERY  11/2008  ? Dr Joya Salm  ? TEE WITHOUT CARDIOVERSION N/A 02/15/2017  ? Procedure: TRANSESOPHAGEAL ECHOCARDIOGRAM (TEE);  Surgeon: Grace Isaac, MD;  Location: [place];  Service: Open Heart Surgery;  Laterality: N/A;  ? UPPER GASTROINTESTINAL ENDOSCOPY    ? ? ?Current Medications: ?No outpatient medications have been marked as taking for the 02/10/22 encounter (Appointment) with Freada Bergeron, MD.  ?  ? ?  Allergies:   Patient has no known allergies.  ? ?Social History  ? ?Socioeconomic History  ? Marital status: Married  ?  Spouse name: Not on file  ? Number of children: 4  ? Years of education: Not on file  ? Highest education level: Not on file  ?Occupational History  ? Occupation: Retired Engineer, maintenance  ?Tobacco Use  ? Smoking status: Former  ?  Packs/day: 1.25  ?  Years: 50.00  ?  Pack years: 62.50  ?  Types: Cigarettes  ?  Quit date: 11/11/2008  ?  Years since quitting: 13.2  ? Smokeless tobacco: Former  ?  Types: Chew  ? Tobacco comments:  ?  smoked off and on for years  ?Vaping Use  ? Vaping Use: Never used  ?Substance and Sexual  Activity  ? Alcohol use: Yes  ?  Alcohol/week: 6.0 standard drinks  ?  Types: 2 Glasses of wine, 4 Cans of beer per week  ?  Comment: vodka  ? Drug use: No  ? Sexual activity: Never  ?Other Topics Concern  ? Not on file  ?Social History Narrative  ? ** Merged History Encounter **  ?    ? 4 sons  ? ?Social Determinants of Health  ? ?Financial Resource Strain: Not on file  ?Food Insecurity: Not on file  ?Transportation Needs: Not on file  ?Physical Activity: Not on file  ?Stress: Not on file  ?Social Connections: Not on file  ?  ? ?Family History: ?The patient's ***family history includes Cancer in his mother; Diabetes in his father and sister; Heart disease in his father; Hypertension in his father; Stroke in his father. There is no history of Stomach cancer, Pancreatic cancer, Colon cancer, Esophageal cancer, or Rectal cancer. ? ?ROS:   ?Please see the history of present illness.    ?*** All other systems reviewed and are negative. ? ?EKGs/Labs/Other Studies Reviewed:   ? ?The following studies were reviewed today: ?*** ? ?EKG:  EKG is *** ordered today.  The ekg ordered today demonstrates *** ? ?Recent Labs: ?09/28/2021: ALT 10; BUN 15; Creatinine 1.03; Hemoglobin 14.4; Platelet Count 214; Potassium 3.6; Sodium 136  ?Recent Lipid Panel ?   ?Component Value Date/Time  ? CHOL 128 01/13/2021 1019  ? TRIG 150 (H) 01/13/2021 1019  ? HDL 48 01/13/2021 1019  ? CHOLHDL 2.7 01/13/2021 1019  ? CHOLHDL 3.4 02/12/2017 0657  ? VLDL 17 02/12/2017 0657  ? LDLCALC 54 01/13/2021 1019  ? LDLDIRECT 64 04/24/2019 0917  ? LDLDIRECT 133.2 10/14/2011 1106  ? ? ? ?Risk Assessment/Calculations:   ?{Does this patient have ATRIAL FIBRILLATION?:[phone]} ? ?    ? ?Physical Exam:   ? ?VS:  There were no vitals taken for this visit.   ? ?Wt Readings from Last 3 Encounters:  ?10/01/21 214 lb 8 oz (97.3 kg)  ?09/11/21 218 lb (98.9 kg)  ?05/21/21 224 lb (101.6 kg)  ?  ? ?GEN: *** Well nourished, well developed in no acute distress ?HEENT:  Normal ?NECK: No JVD; No carotid bruits ?LYMPHATICS: No lymphadenopathy ?CARDIAC: ***RRR, no murmurs, rubs, gallops ?RESPIRATORY:  Clear to auscultation without rales, wheezing or rhonchi  ?ABDOMEN: Soft, non-tender, non-distended ?MUSCULOSKELETAL:  No edema; No deformity  ?SKIN: Warm and dry ?NEUROLOGIC:  Alert and oriented x 3 ?PSYCHIATRIC:  Normal affect  ? ?ASSESSMENT:   ? ?No diagnosis found. ?PLAN:   ? ?In order of problems listed above: ? ?*** ? ?   ? ?{Are you ordering a  CV Procedure (e.g. stress test, cath, DCCV, TEE, etc)?   Press F2        :[id number]}  ? ? ?Medication Adjustments/Labs and Tests Ordered: ?Current medicines are reviewed at length with the patient today.  Concerns regarding medicines are outlined above.  ?No orders of the defined types were placed in this encounter. ? ?No orders of the defined types were placed in this encounter. ? ? ?There are no Patient Instructions on file for this visit.  ? ?Signed, ?Freada Bergeron, MD  ?02/06/2022 3:16 PM    ?[place] ?

## 2022-02-07 NOTE — Progress Notes (Deleted)
?Cardiology Office Note:   ? ?Date:  02/07/2022  ? ?ID:  Vincent Mcclure, DOB [birth year], MRN [id number] ? ?PCP:  Program, [place] [facility] ?  ?[facility] Providers ?Cardiologist:  None ?Cardiology APP:  Imogene Burn, PA-C  ?Electrophysiologist:  Cristopher Peru, MD  ?{ ? ?Referring MD: Program, [place]  ? ? ?History of Present Illness:   ? ?Vincent Mcclure is a 88 y.o. male with a hx of CAD (NSTEMI 2018 s/p CABGx3), PAF, PACs, sinus bradycardia, HTN, HLD (followed by PCP), gastric ulcers with GIB 2016 2/2 ETOH, tobacco abuse, colon CA s/p surgery, NSCLC (radiation) who was previously followed by Dr. Meda Coffee who now presents to clinic for follow-up. ? ?Per review of the record, the patient underwent nuclear stress test in in 06/2020 for chest pain that was negative for ischemia. He takes sporadic SL NTG for angina. He was started on isosorbide 15mg  daily at that time but eventually opted not to take it. In general he is wary about starting new medicines. In 10/2020 he saw Dr. Meda Coffee for an episode of syncope that had happened 2 months prior. HR was in the low 50s so Toprol was reduced and he was restarted on baby ASA (he had stopped this after reading about it on the internet). Event monitor showed predominantly NSR but 18% PAF burden and frequent PACs, rare PVCs. Average HR overall 93bpm, minimum 61, max 183. Aspirin was stopped and he was started on Eliquis. He saw Dr. Lovena Le in 11/2020 for potential tachy-brady syndrome. He was not felt to require any intervention beyond BB/Eliquis at this time. Dr. Lovena Le increased Toprol back to 50mg  daily, however, the patient chose to go back on 25mg  XL. ? ?Was last seen in clinic on 04/2021 where he was doing well from a CV standpoint. TTE 05/2021 showed prominent crista terminalis with no evidence of a mass.  ? ? ?Past Medical History:  ?Diagnosis Date  ? Alcohol abuse   ? Anemia   ? Arthritis   ? hands  ? Blood transfusion without reported  diagnosis   ? CAD (coronary artery disease)   ? a. NSTEMI 2018 s/p CABG.  ? CHF (congestive heart failure) ([place])   ? Chronic low back pain 10/14/2011  ? colon ca dx'd 05/18/18  ? colon cancer  ? Colon polyps   ? hyperplastic (2004, 2010) and adenomatous (1990).    ? COLONIC POLYPS, HX OF 03/05/2008  ? Esophageal stricture   ? hx of  ? Gastric ulcer   ? GERD 03/05/2008  ? GERD (gastroesophageal reflux disease) 1994  ? associated peptic strictures  ? HYPERLIPIDEMIA 03/05/2008  ? HYPERTENSION 03/05/2008  ? Hypertension   ? IBS 03/05/2008  ? Impaired glucose tolerance 10/12/2011  ? Iron deficiency anemia   ? LEG CRAMPS 09/02/2010  ? Lung cancer ([place]) dx'd 06/2020  ? Myocardial infarction [facility])   ? May 2018  ? PAD (peripheral artery disease) ([place]) 2013  ? PAF (paroxysmal atrial fibrillation) ([place])   ? PERIPHERAL EDEMA 09/02/2010  ? PERIPHERAL NEUROPATHY 03/05/2008  ? Premature atrial contraction   ? Prostatitis   ? hx of  ? PVC's (premature ventricular contractions)   ? Sinus brady-tachy syndrome ([place])   ? VARICOSE VEINS, LOWER EXTREMITIES 06/09/2009  ? ? ?Past Surgical History:  ?Procedure Laterality Date  ? CATARACT EXTRACTION  bilat  ? COLECTOMY    ? COLONOSCOPY    ? CORONARY ARTERY BYPASS GRAFT N/A 02/15/2017  ? Procedure: CORONARY  ARTERY BYPASS GRAFTING times three  with left internal mammary harvest and endoscopic harvest of Right SVG. Grafts of LIMA to  LAD, SVG to Distal Circ, and to First Diag.;  Surgeon: Grace Isaac, MD;  Location: [place];  Service: Open Heart Surgery;  Laterality: N/A;  ? ESOPHAGOGASTRODUODENOSCOPY N/A 11/14/2014  ? Procedure: ESOPHAGOGASTRODUODENOSCOPY (EGD);  Surgeon: Jerene Bears, MD;  Location: [facility] ENDOSCOPY;  Service: Endoscopy;  Laterality: N/A;  ? IR RADIOLOGIST EVAL & MGMT  07/04/2018  ? IR RADIOLOGIST EVAL & MGMT  07/18/2018  ? LAPAROTOMY N/A 05/19/2018  ? Procedure: EXPLORATORY LAPAROTOMY RIGHT COLECTOMY;  Surgeon: Fanny Skates, MD;  [facility];  Service: General;  Laterality: N/A;  ? LEFT  HEART CATH AND CORONARY ANGIOGRAPHY N/A 02/14/2017  ? Procedure: Left Heart Cath and Coronary Angiography;  Surgeon: Belva Crome, MD;  Location: [place] CV LAB;  Service: Cardiovascular;  Laterality: N/A;  ? LUMBAR SPINE SURGERY  11/2008  ? Dr Joya Salm  ? TEE WITHOUT CARDIOVERSION N/A 02/15/2017  ? Procedure: TRANSESOPHAGEAL ECHOCARDIOGRAM (TEE);  Surgeon: Grace Isaac, MD;  Location: [place];  Service: Open Heart Surgery;  Laterality: N/A;  ? UPPER GASTROINTESTINAL ENDOSCOPY    ? ? ?Current Medications: ?No outpatient medications have been marked as taking for the 02/10/22 encounter (Appointment) with Freada Bergeron, MD.  ?  ? ?Allergies:   Patient has no known allergies.  ? ?Social History  ? ?Socioeconomic History  ? Marital status: Married  ?  Spouse name: Not on file  ? Number of children: 4  ? Years of education: Not on file  ? Highest education level: Not on file  ?Occupational History  ? Occupation: Retired Engineer, maintenance  ?Tobacco Use  ? Smoking status: Former  ?  Packs/day: 1.25  ?  Years: 50.00  ?  Pack years: 62.50  ?  Types: Cigarettes  ?  Quit date: 11/11/2008  ?  Years since quitting: 13.2  ? Smokeless tobacco: Former  ?  Types: Chew  ? Tobacco comments:  ?  smoked off and on for years  ?Vaping Use  ? Vaping Use: Never used  ?Substance and Sexual Activity  ? Alcohol use: Yes  ?  Alcohol/week: 6.0 standard drinks  ?  Types: 2 Glasses of wine, 4 Cans of beer per week  ?  Comment: vodka  ? Drug use: No  ? Sexual activity: Never  ?Other Topics Concern  ? Not on file  ?Social History Narrative  ? ** Merged History Encounter **  ?    ? 4 sons  ? ?Social Determinants of Health  ? ?Financial Resource Strain: Not on file  ?Food Insecurity: Not on file  ?Transportation Needs: Not on file  ?Physical Activity: Not on file  ?Stress: Not on file  ?Social Connections: Not on file  ?  ? ?Family History: ?The patient's family history includes Cancer in his mother; Diabetes in his father and sister; Heart disease  in his father; Hypertension in his father; Stroke in his father. There is no history of Stomach cancer, Pancreatic cancer, Colon cancer, Esophageal cancer, or Rectal cancer. ? ?ROS:   ?Review of Systems  ?Constitutional:  Negative for diaphoresis and malaise/fatigue.  ?HENT:  Negative for hearing loss and sinus pain.   ?Eyes:  Negative for blurred vision and pain.  ?Respiratory:  Positive for shortness of breath. Negative for cough.   ?Cardiovascular:  Positive for leg swelling. Negative for chest pain, palpitations, orthopnea, claudication and PND.  ?  Gastrointestinal:  Negative for abdominal pain, blood in stool, constipation and diarrhea.  ?Genitourinary:  Negative for dysuria and frequency.  ?Musculoskeletal:  Negative for back pain, joint pain and neck pain.  ?Neurological:  Negative for dizziness, tremors, weakness and headaches.  ? ? ?EKGs/Labs/Other Studies Reviewed:   ? ?The following studies were reviewed today: ?TTE 05/14/2021: ?IMPRESSIONS  ? ? ? 1. Left ventricular ejection fraction, by estimation, is 60 to 65%. The  ?left ventricle has normal function. The left ventricle has no regional  ?wall motion abnormalities. Left ventricular diastolic parameters are  ?consistent with Grade II diastolic  ?dysfunction (pseudonormalization). Elevated left atrial pressure.  ? 2. Right ventricular systolic function is normal. The right ventricular  ?size is normal. There is normal pulmonary artery systolic pressure.  ? 3. Left atrial size was mildly dilated.  ? 4. There is no mass in the right atrium. Right atrial size was mildly  ?dilated.  ? 5. The mitral valve is normal in structure. Mild mitral valve  ?regurgitation. No evidence of mitral stenosis.  ? 6. The aortic valve is normal in structure. Aortic valve regurgitation is  ?not visualized. Mild aortic valve sclerosis is present, with no evidence  ?of aortic valve stenosis.  ? 7. The inferior vena cava is normal in size with greater than 50%  ?respiratory  variability, suggesting right atrial pressure of 3 mmHg.  ? ?Comparison(s): No significant change from prior study. Prior images  ?reviewed side by side.  ? ?2D Echo 05/2018 ?- Left ventricle: The cavity size was no

## 2022-02-10 ENCOUNTER — Ambulatory Visit: Payer: Medicare HMO | Admitting: Cardiology

## 2022-02-10 ENCOUNTER — Encounter: Payer: Self-pay | Admitting: Cardiology

## 2022-02-10 VITALS — BP 128/82 | HR 71 | Ht 71.0 in | Wt 214.4 lb

## 2022-02-10 DIAGNOSIS — I10 Essential (primary) hypertension: Secondary | ICD-10-CM | POA: Diagnosis not present

## 2022-02-10 DIAGNOSIS — I509 Heart failure, unspecified: Secondary | ICD-10-CM | POA: Diagnosis not present

## 2022-02-10 DIAGNOSIS — I25118 Atherosclerotic heart disease of native coronary artery with other forms of angina pectoris: Secondary | ICD-10-CM

## 2022-02-10 DIAGNOSIS — I480 Paroxysmal atrial fibrillation: Secondary | ICD-10-CM | POA: Diagnosis not present

## 2022-02-10 DIAGNOSIS — I214 Non-ST elevation (NSTEMI) myocardial infarction: Secondary | ICD-10-CM | POA: Diagnosis not present

## 2022-02-10 DIAGNOSIS — E782 Mixed hyperlipidemia: Secondary | ICD-10-CM | POA: Diagnosis not present

## 2022-02-10 DIAGNOSIS — I110 Hypertensive heart disease with heart failure: Secondary | ICD-10-CM | POA: Diagnosis not present

## 2022-02-10 DIAGNOSIS — I495 Sick sinus syndrome: Secondary | ICD-10-CM | POA: Diagnosis not present

## 2022-02-10 DIAGNOSIS — R600 Localized edema: Secondary | ICD-10-CM | POA: Diagnosis not present

## 2022-02-10 MED ORDER — NITROGLYCERIN 0.4 MG SL SUBL: 0.4000 mg | SUBLINGUAL_TABLET | SUBLINGUAL | 3 refills | Status: DC | PRN

## 2022-02-10 NOTE — Progress Notes (Signed)
?Cardiology Office Note:   ? ?Date:  02/10/2022  ? ?ID:  Vincent Mcclure, DOB 1932-08-11, MRN [id number] ? ?PCP:  Program, [place] [facility] ?  ?[facility] Providers ?Cardiologist:  None ?Cardiology APP:  Imogene Burn, PA-C  ?Electrophysiologist:  Cristopher Peru, MD  ?{ ? ?Referring MD: Program, [place]  ? ? ?History of Present Illness:   ? ?Vincent Mcclure is a 88 y.o. male with a hx of CAD (NSTEMI 2018 s/p CABGx3), PAF, PACs, sinus bradycardia, HTN, HLD (followed by PCP), gastric ulcers with GIB 2016 2/2 ETOH, tobacco abuse, colon CA s/p surgery, NSCLC (radiation) who was previously followed by Dr. Meda Coffee who now presents to clinic for follow-up. ? ?Per review of the record, the patient underwent nuclear stress test in in 06/2020 for chest pain that was negative for ischemia. He takes sporadic SL NTG for angina. He was started on isosorbide 15mg  daily at that time but eventually opted not to take it. In general he is wary about starting new medicines. In 10/2020 he saw Dr. Meda Coffee for an episode of syncope that had happened 2 months prior. HR was in the low 50s so Toprol was reduced and he was restarted on baby ASA (he had stopped this after reading about it on the internet). Event monitor showed predominantly NSR but 18% PAF burden and frequent PACs, rare PVCs. Average HR overall 93bpm, minimum 61, max 183. Aspirin was stopped and he was started on Eliquis. He saw Dr. Lovena Le in 11/2020 for potential tachy-brady syndrome. He was not felt to require any intervention beyond BB/Eliquis at this time. Dr. Lovena Le increased Toprol back to 50mg  daily, however, the patient chose to go back on 25mg  XL. ? ?Was last seen in clinic on 04/2021 where he was doing well from a CV standpoint. TTE 05/2021 showed prominent crista terminalis with no evidence of a mass.  ? ?Today, the patient states that he is hard of hearing. Generally he is feeling good. He denies lightheadedness or dizziness. ? ?Since his  last visit, he had one episode of severe chest pain at least 4-5 months ago. He eventually needed to take 4 nitroglycerin. He did not present to the ED, but felt better the following day. This severe chest pain has not recurred. However, he continues to have "little chest pains" that are intermittent and do not concern him. If he needs to he will take a tylenol and drink milk, which usually relieves his pain.  ? ?Also, he complains of gait instability and difficulty walking due to his knees. ? ?He reports losing some weight since the last appointment. His appetite is reportedly "too good." ? ?No significant bleeding issues on Eliquis. ? ?He denies any palpitations. No headaches, syncope, orthopnea, or PND. ? ? ?Past Medical History:  ?Diagnosis Date  ? Alcohol abuse   ? Anemia   ? Arthritis   ? hands  ? Blood transfusion without reported diagnosis   ? CAD (coronary artery disease)   ? a. NSTEMI 2018 s/p CABG.  ? CHF (congestive heart failure) ([place])   ? Chronic low back pain 10/14/2011  ? colon ca dx'd 05/18/18  ? colon cancer  ? Colon polyps   ? hyperplastic (2004, 2010) and adenomatous (1990).    ? COLONIC POLYPS, HX OF 03/05/2008  ? Esophageal stricture   ? hx of  ? Gastric ulcer   ? GERD 03/05/2008  ? GERD (gastroesophageal reflux disease) 1994  ? associated peptic strictures  ?  HYPERLIPIDEMIA 03/05/2008  ? HYPERTENSION 03/05/2008  ? Hypertension   ? IBS 03/05/2008  ? Impaired glucose tolerance 10/12/2011  ? Iron deficiency anemia   ? LEG CRAMPS 09/02/2010  ? Lung cancer ([place]) dx'd 06/2020  ? Myocardial infarction [facility])   ? May 2018  ? PAD (peripheral artery disease) ([place]) 2013  ? PAF (paroxysmal atrial fibrillation) ([place])   ? PERIPHERAL EDEMA 09/02/2010  ? PERIPHERAL NEUROPATHY 03/05/2008  ? Premature atrial contraction   ? Prostatitis   ? hx of  ? PVC's (premature ventricular contractions)   ? Sinus brady-tachy syndrome ([place])   ? VARICOSE VEINS, LOWER EXTREMITIES 06/09/2009  ? ? ?Past Surgical History:  ?Procedure Laterality  Date  ? CATARACT EXTRACTION  bilat  ? COLECTOMY    ? COLONOSCOPY    ? CORONARY ARTERY BYPASS GRAFT N/A 02/15/2017  ? Procedure: CORONARY ARTERY BYPASS GRAFTING times three  with left internal mammary harvest and endoscopic harvest of Right SVG. Grafts of LIMA to  LAD, SVG to Distal Circ, and to First Diag.;  Surgeon: Grace Isaac, MD;  Location: [place];  Service: Open Heart Surgery;  Laterality: N/A;  ? ESOPHAGOGASTRODUODENOSCOPY N/A 11/14/2014  ? Procedure: ESOPHAGOGASTRODUODENOSCOPY (EGD);  Surgeon: Jerene Bears, MD;  Location: [facility] ENDOSCOPY;  Service: Endoscopy;  Laterality: N/A;  ? IR RADIOLOGIST EVAL & MGMT  07/04/2018  ? IR RADIOLOGIST EVAL & MGMT  07/18/2018  ? LAPAROTOMY N/A 05/19/2018  ? Procedure: EXPLORATORY LAPAROTOMY RIGHT COLECTOMY;  Surgeon: Fanny Skates, MD;  [facility];  Service: General;  Laterality: N/A;  ? LEFT HEART CATH AND CORONARY ANGIOGRAPHY N/A 02/14/2017  ? Procedure: Left Heart Cath and Coronary Angiography;  Surgeon: Belva Crome, MD;  Location: [place] CV LAB;  Service: Cardiovascular;  Laterality: N/A;  ? LUMBAR SPINE SURGERY  11/2008  ? Dr Joya Salm  ? TEE WITHOUT CARDIOVERSION N/A 02/15/2017  ? Procedure: TRANSESOPHAGEAL ECHOCARDIOGRAM (TEE);  Surgeon: Grace Isaac, MD;  Location: [place];  Service: Open Heart Surgery;  Laterality: N/A;  ? UPPER GASTROINTESTINAL ENDOSCOPY    ? ? ?Current Medications: ?Current Meds  ?Medication Sig  ? acetaminophen (TYLENOL) 500 MG tablet Take 1 tablet (500 mg total) by mouth every 6 (six) hours as needed for mild pain.  ? amoxicillin (AMOXIL) 875 MG tablet Take 1 tablet (875 mg total) by mouth 2 (two) times daily.  ? apixaban (ELIQUIS) 5 MG TABS tablet Take 1 tablet (5 mg total) by mouth 2 (two) times daily.  ? bacitracin 500 UNIT/GM ointment Apply 1 application topically daily as needed for wound care.  ? Cholecalciferol (VITAMIN D-3 PO) Take 2 capsules by mouth daily.   ? hydrochlorothiazide (HYDRODIURIL) 25 MG tablet Take 1 tablet (25 mg  total) by mouth daily.  ? metoprolol succinate (TOPROL XL) 25 MG 24 hr tablet Take 1 tablet (25 mg total) by mouth daily.  ? pravastatin (PRAVACHOL) 40 MG tablet Take 1 tablet (40 mg total) by mouth at bedtime.  ? [DISCONTINUED] nitroGLYCERIN (NITROSTAT) 0.4 MG SL tablet Place 1 tablet (0.4 mg total) under the tongue every 5 (five) minutes as needed for chest pain (or tightness).  ?  ? ?Allergies:   Patient has no known allergies.  ? ?Social History  ? ?Socioeconomic History  ? Marital status: Married  ?  Spouse name: Not on file  ? Number of children: 4  ? Years of education: Not on file  ? Highest education level: Not on file  ?Occupational History  ? Occupation:  Retired Engineer, maintenance  ?Tobacco Use  ? Smoking status: Former  ?  Packs/day: 1.25  ?  Years: 50.00  ?  Pack years: 62.50  ?  Types: Cigarettes  ?  Quit date: 11/11/2008  ?  Years since quitting: 13.2  ? Smokeless tobacco: Former  ?  Types: Chew  ? Tobacco comments:  ?  smoked off and on for years  ?Vaping Use  ? Vaping Use: Never used  ?Substance and Sexual Activity  ? Alcohol use: Yes  ?  Alcohol/week: 6.0 standard drinks  ?  Types: 2 Glasses of wine, 4 Cans of beer per week  ?  Comment: vodka  ? Drug use: No  ? Sexual activity: Never  ?Other Topics Concern  ? Not on file  ?Social History Narrative  ? ** Merged History Encounter **  ?    ? 4 sons  ? ?Social Determinants of Health  ? ?Financial Resource Strain: Not on file  ?Food Insecurity: Not on file  ?Transportation Needs: Not on file  ?Physical Activity: Not on file  ?Stress: Not on file  ?Social Connections: Not on file  ?  ? ?Family History: ?The patient's family history includes Cancer in his mother; Diabetes in his father and sister; Heart disease in his father; Hypertension in his father; Stroke in his father. There is no history of Stomach cancer, Pancreatic cancer, Colon cancer, Esophageal cancer, or Rectal cancer. ? ?ROS:   ?Review of Systems  ?Constitutional:  Negative for diaphoresis and  malaise/fatigue.  ?HENT:  Positive for hearing loss. Negative for sinus pain.   ?Eyes:  Negative for blurred vision and pain.  ?Respiratory:  Positive for shortness of breath. Negative for cough.   ?Cardiov

## 2022-02-10 NOTE — Patient Instructions (Signed)
Medication Instructions:  ? ?Your physician recommends that you continue on your current medications as directed. Please refer to the Current Medication list given to you today. ? ?*If you need a refill on your cardiac medications before your next appointment, please call your pharmacy* ? ? ?Follow-Up: ?At [facility], you and your health needs are our priority.  As part of our continuing mission to provide you with exceptional heart care, we have created designated Provider Care Teams.  These Care Teams include your primary Cardiologist (physician) and Advanced Practice Providers (APPs -  Physician Assistants and Nurse Practitioners) who all work together to provide you with the care you need, when you need it. ? ?We recommend signing up for the patient portal called "MyChart".  Sign up information is provided on this After Visit Summary.  MyChart is used to connect with patients for Virtual Visits (Telemedicine).  Patients are able to view lab/test results, encounter notes, upcoming appointments, etc.  Non-urgent messages can be sent to your provider as well.   ?To learn more about what you can do with MyChart, go to [email or web address].   ? ?Your next appointment:   ?6 month(s) ? ?The format for your next appointment:   ?In Person ? ?Provider:   ?Dr. Johney Frame  ? ?Important Information About Sugar ? ? ? ? ? ? ?

## 2022-03-29 ENCOUNTER — Other Ambulatory Visit: Payer: Self-pay

## 2022-03-29 DIAGNOSIS — I10 Essential (primary) hypertension: Secondary | ICD-10-CM

## 2022-03-29 DIAGNOSIS — I25810 Atherosclerosis of coronary artery bypass graft(s) without angina pectoris: Secondary | ICD-10-CM

## 2022-03-29 DIAGNOSIS — E782 Mixed hyperlipidemia: Secondary | ICD-10-CM

## 2022-03-29 DIAGNOSIS — E785 Hyperlipidemia, unspecified: Secondary | ICD-10-CM

## 2022-03-29 DIAGNOSIS — I5189 Other ill-defined heart diseases: Secondary | ICD-10-CM

## 2022-03-29 DIAGNOSIS — I2510 Atherosclerotic heart disease of native coronary artery without angina pectoris: Secondary | ICD-10-CM

## 2022-03-29 DIAGNOSIS — Z951 Presence of aortocoronary bypass graft: Secondary | ICD-10-CM

## 2022-03-29 MED ORDER — METOPROLOL SUCCINATE ER 25 MG PO TB24: 25.0000 mg | ORAL_TABLET | Freq: Every day | ORAL | 1 refills | Status: DC

## 2022-03-30 ENCOUNTER — Other Ambulatory Visit: Payer: Self-pay

## 2022-03-30 DIAGNOSIS — E782 Mixed hyperlipidemia: Secondary | ICD-10-CM

## 2022-03-30 DIAGNOSIS — I5189 Other ill-defined heart diseases: Secondary | ICD-10-CM

## 2022-03-30 DIAGNOSIS — Z951 Presence of aortocoronary bypass graft: Secondary | ICD-10-CM

## 2022-03-30 DIAGNOSIS — I2510 Atherosclerotic heart disease of native coronary artery without angina pectoris: Secondary | ICD-10-CM

## 2022-03-30 DIAGNOSIS — E785 Hyperlipidemia, unspecified: Secondary | ICD-10-CM

## 2022-03-30 DIAGNOSIS — I25810 Atherosclerosis of coronary artery bypass graft(s) without angina pectoris: Secondary | ICD-10-CM

## 2022-03-30 DIAGNOSIS — I10 Essential (primary) hypertension: Secondary | ICD-10-CM

## 2022-03-30 MED ORDER — METOPROLOL SUCCINATE ER 25 MG PO TB24: 25.0000 mg | ORAL_TABLET | Freq: Every day | ORAL | 3 refills | Status: DC

## 2022-04-26 ENCOUNTER — Other Ambulatory Visit: Payer: Self-pay

## 2022-04-26 DIAGNOSIS — I25810 Atherosclerosis of coronary artery bypass graft(s) without angina pectoris: Secondary | ICD-10-CM

## 2022-04-26 DIAGNOSIS — E782 Mixed hyperlipidemia: Secondary | ICD-10-CM

## 2022-04-26 MED ORDER — PRAVASTATIN SODIUM 40 MG PO TABS: 40.0000 mg | ORAL_TABLET | Freq: Every day | ORAL | 2 refills | Status: DC

## 2022-05-03 ENCOUNTER — Encounter: Payer: Self-pay | Admitting: Physician Assistant

## 2022-05-03 ENCOUNTER — Ambulatory Visit (INDEPENDENT_AMBULATORY_CARE_PROVIDER_SITE_OTHER): Payer: Medicare HMO | Admitting: Physician Assistant

## 2022-05-03 VITALS — BP 100/50 | HR 91 | Temp 97.7°F | Ht 72.0 in | Wt 216.0 lb

## 2022-05-03 DIAGNOSIS — T300 Burn of unspecified body region, unspecified degree: Secondary | ICD-10-CM | POA: Diagnosis not present

## 2022-05-03 MED ORDER — SILVER SULFADIAZINE 1 % EX CREA: 1.0000 | TOPICAL_CREAM | Freq: Every day | CUTANEOUS | 0 refills | Status: AC
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
## 2022-05-03 NOTE — Patient Instructions (Signed)
Burn Care, Adult A burn is an injury to the skin or the tissues under the skin. There are three types of burns: First degree. These burns may cause the skin to be red and a bit swollen. Second degree. These burns are very painful and cause the skin to be very red. The skin may also swell, leak fluid, look shiny, and start to have blisters. Third degree. These burns cause lasting damage. They turn the skin white or black and make it look charred, dry, and leathery. Treatment for your burn will depend on the type of burn you have. Taking good care of your burn can help to prevent pain and infection. It can also help the burn heal quickly. How to care for a first-degree burn Right after a burn: Rinse or soak the burn under cool water for 5 minutes or more. Do not put ice on your burn. That can cause more damage. Put a cool, clean, wet cloth on your burn. Put lotion or gel with aloe vera on your burn. Caring for the burn Clean and care for your burn. Your doctor may tell you: To clean the burn using soap and water. To pat the burn dry using a clean cloth. Do not rub or scrub the burn. To put lotion or gel with aloe vera on your burn. How to care for a second-degree burn Right after a burn: Rinse or soak the burn under cool water. Do this for 5 to 10 minutes. Do not put ice on your burn. This can cause more damage. Remove any jewelry near the burned area. Cover the burn with a clean cloth. Caring for the burn Raise (elevate) the burned area above the level of your heart while sitting or lying down. Clean and care for your burn. Your doctor may tell you: To clean or rinse your burn. To put a cream or ointment on the burn. To place a germ-free (sterile) dressing over the burn. A dressing is a material that is placed on a burn to help it heal. How to care for a third-degree burn Right after a burn: Cover the burn with a clean, dry cloth. Seek treatment right away if you have this kind of burn.  You may: Need to stay in the hospital. Have surgery to remove burned tissue. Have surgery to put new skin on the burned area. Be given fluids through an IV tube. Caring for the burn Clean and care for your burn. Your doctor may tell you: To clean or rinse your burn. To put a cream or ointment on the burn. To put a sterile dressing in the burn. This is called packing. To place a sterile dressing over the burn. Other things to do Raise the burned area above the level of your heart while sitting or lying down. Wear splints or immobilizers if told by your doctor. Rest as told by your doctor. Do not do sports or other activities until your doctor approves. How to prevent infection when caring for a burn  Take these steps to prevent infection: Wash your hands with soap and water for at least 20 seconds before and after caring for your burn. If you cannot use soap and water, use hand sanitizer. Wear clean or sterile gloves as told by your doctor. Do not put butter, oil, toothpaste, or other home remedies on the burn. Do not scratch or pick at the burn. Do not break any blisters. Do not peel the skin. Do not rub your burn, even when  you are cleaning it. Check your burn every day for these signs of infection: More redness, swelling, or pain. Warmth. Pus or a bad smell. Red streaks around the burn. Follow these instructions at home Medicines Take over-the-counter and prescription medicines only as told by your doctor. If you were prescribed an antibiotic medicine, use it as told by your doctor. Do not stop using the antibiotic even if your condition gets better. Your doctor may ask you to take medicine for pain before you change your dressing. General instructions  Protect your burn from the sun. Drink enough fluid to keep your pee (urine) pale yellow. Do not use any products that contain nicotine or tobacco, such as cigarettes, e-cigarettes, and chewing tobacco. These can delay healing.  If you need help quitting, ask your doctor. Keep all follow-up visits as told by your doctor. This is important. Contact a doctor if: Your condition does not get better. Your condition gets worse. You have a fever or chills. Your burn feels warm to the touch. You have more redness, swelling, or pain on your burn. Your burn looks different or starts to have black or red spots on it. Your pain does not get better with medicine. Get help right away if: You have more fluid, blood, or pus coming from your burn. You have red streaks near the burn. You have very bad pain. Summary There are three types of burns. They are first degree, second degree, and third degree. Of these, a third-degree burn is most serious. This must be treated right away. Treatment for your burn will depend on the type of burn you have. Do not put butter, oil, toothpaste, or other home remedies on the burn. These things can damage your skin. Follow instructions from your doctor about how to clean and take care of your burn. This information is not intended to replace advice given to you by your health care provider. Make sure you discuss any questions you have with your health care provider. Document Revised: 11/16/2019 Document Reviewed: 07/17/2019 Elsevier Patient Education  [place].

## 2022-05-04 DIAGNOSIS — L570 Actinic keratosis: Secondary | ICD-10-CM | POA: Diagnosis not present

## 2022-05-04 DIAGNOSIS — X32XXXD Exposure to sunlight, subsequent encounter: Secondary | ICD-10-CM | POA: Diagnosis not present

## 2022-05-04 DIAGNOSIS — B078 Other viral warts: Secondary | ICD-10-CM | POA: Diagnosis not present

## 2022-05-17 ENCOUNTER — Other Ambulatory Visit: Payer: Self-pay | Admitting: *Deleted

## 2022-05-17 DIAGNOSIS — I2510 Atherosclerotic heart disease of native coronary artery without angina pectoris: Secondary | ICD-10-CM

## 2022-05-17 DIAGNOSIS — I25810 Atherosclerosis of coronary artery bypass graft(s) without angina pectoris: Secondary | ICD-10-CM

## 2022-05-17 DIAGNOSIS — I214 Non-ST elevation (NSTEMI) myocardial infarction: Secondary | ICD-10-CM

## 2022-05-17 DIAGNOSIS — I209 Angina pectoris, unspecified: Secondary | ICD-10-CM

## 2022-05-17 MED ORDER — HYDROCHLOROTHIAZIDE 25 MG PO TABS: 25.0000 mg | ORAL_TABLET | Freq: Every day | ORAL | 2 refills | Status: DC

## 2022-06-02 ENCOUNTER — Other Ambulatory Visit: Payer: Self-pay

## 2022-06-02 DIAGNOSIS — C3431 Malignant neoplasm of lower lobe, right bronchus or lung: Secondary | ICD-10-CM

## 2022-06-02 DIAGNOSIS — C3491 Malignant neoplasm of unspecified part of right bronchus or lung: Secondary | ICD-10-CM

## 2022-06-02 DIAGNOSIS — C182 Malignant neoplasm of ascending colon: Secondary | ICD-10-CM

## 2022-06-03 ENCOUNTER — Inpatient Hospital Stay: Payer: Medicare HMO | Admitting: Hematology

## 2022-06-03 ENCOUNTER — Other Ambulatory Visit: Payer: Self-pay

## 2022-06-03 ENCOUNTER — Inpatient Hospital Stay: Payer: Medicare HMO | Attending: Hematology

## 2022-06-03 ENCOUNTER — Encounter: Payer: Self-pay | Admitting: Hematology

## 2022-06-03 VITALS — BP 157/80 | HR 77 | Temp 97.6°F | Resp 18 | Ht 72.0 in | Wt 215.7 lb

## 2022-06-03 DIAGNOSIS — C3431 Malignant neoplasm of lower lobe, right bronchus or lung: Secondary | ICD-10-CM

## 2022-06-03 DIAGNOSIS — C3491 Malignant neoplasm of unspecified part of right bronchus or lung: Secondary | ICD-10-CM

## 2022-06-03 DIAGNOSIS — C182 Malignant neoplasm of ascending colon: Secondary | ICD-10-CM | POA: Insufficient documentation

## 2022-06-03 DIAGNOSIS — Z79899 Other long term (current) drug therapy: Secondary | ICD-10-CM | POA: Insufficient documentation

## 2022-06-03 LAB — CBC WITH DIFFERENTIAL/PLATELET
Abs Immature Granulocytes: 0.01 10*3/uL (ref 0.00–0.07)
Basophils Absolute: 0 10*3/uL (ref 0.0–0.1)
Basophils Relative: 0 %
Eosinophils Absolute: 0.1 10*3/uL (ref 0.0–0.5)
Eosinophils Relative: 1 %
HCT: 44.1 % (ref 39.0–52.0)
Hemoglobin: 14.8 g/dL (ref 13.0–17.0)
Immature Granulocytes: 0 %
Lymphocytes Relative: 22 %
Lymphs Abs: 1.1 10*3/uL (ref 0.7–4.0)
MCH: 29.4 pg (ref 26.0–34.0)
MCHC: 33.6 g/dL (ref 30.0–36.0)
MCV: 87.7 fL (ref 80.0–100.0)
Monocytes Absolute: 0.6 10*3/uL (ref 0.1–1.0)
Monocytes Relative: 12 %
Neutro Abs: 3.2 10*3/uL (ref 1.7–7.7)
Neutrophils Relative %: 65 %
Platelets: 156 10*3/uL (ref 150–400)
RBC: 5.03 MIL/uL (ref 4.22–5.81)
RDW: 13.3 % (ref 11.5–15.5)
WBC: 5 10*3/uL (ref 4.0–10.5)
nRBC: 0 % (ref 0.0–0.2)

## 2022-06-03 LAB — COMPREHENSIVE METABOLIC PANEL
ALT: 8 U/L (ref 0–44)
AST: 14 U/L — ABNORMAL LOW (ref 15–41)
Albumin: 4.3 g/dL (ref 3.5–5.0)
Alkaline Phosphatase: 94 U/L (ref 38–126)
Anion gap: 6 (ref 5–15)
BUN: 17 mg/dL (ref 8–23)
CO2: 31 mmol/L (ref 22–32)
Calcium: 9.8 mg/dL (ref 8.9–10.3)
Chloride: 99 mmol/L (ref 98–111)
Creatinine, Ser: 0.92 mg/dL (ref 0.61–1.24)
GFR, Estimated: >60 mL/min (ref >60)
Glucose, Bld: 99 mg/dL (ref 70–99)
Potassium: 3.8 mmol/L (ref 3.5–5.1)
Sodium: 136 mmol/L (ref 135–145)
Total Bilirubin: 0.8 mg/dL (ref 0.3–1.2)
Total Protein: 6.7 g/dL (ref 6.5–8.1)

## 2022-06-03 LAB — CEA (IN HOUSE-CHCC): CEA (CHCC-In House): 1.83 ng/mL (ref 0.00–5.00)
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
## 2022-06-08 ENCOUNTER — Telehealth: Payer: Self-pay | Admitting: Hematology

## 2022-06-08 NOTE — Telephone Encounter (Signed)
Scheduled follow-up appointments per 8/24 los. Patient is aware. Mailed calendar.

## 2022-06-09 ENCOUNTER — Other Ambulatory Visit: Payer: Self-pay

## 2022-06-09 DIAGNOSIS — R001 Bradycardia, unspecified: Secondary | ICD-10-CM

## 2022-06-09 DIAGNOSIS — I4891 Unspecified atrial fibrillation: Secondary | ICD-10-CM

## 2022-06-09 DIAGNOSIS — I495 Sick sinus syndrome: Secondary | ICD-10-CM

## 2022-06-09 DIAGNOSIS — R9431 Abnormal electrocardiogram [ECG] [EKG]: Secondary | ICD-10-CM

## 2022-06-09 MED ORDER — APIXABAN 5 MG PO TABS: 5.0000 mg | ORAL_TABLET | Freq: Two times a day (BID) | ORAL | 5 refills | Status: DC

## 2022-06-09 NOTE — Telephone Encounter (Signed)
Prescription refill request for Eliquis received. Indication: Afib  Last office visit:02/10/22 Johney Frame) Scr: 0.92 (06/03/22) Age: 88 Weight: 97.8kg  Appropriate dose and refill sent to requested pharmacy.

## 2022-06-18 ENCOUNTER — Encounter: Payer: Self-pay | Admitting: Internal Medicine

## 2022-06-29 ENCOUNTER — Telehealth: Payer: Self-pay | Admitting: Internal Medicine

## 2022-06-29 NOTE — Telephone Encounter (Signed)
Called patient, no answer, left a detailed vm regarding the appointment time and date.

## 2022-06-29 NOTE — Telephone Encounter (Signed)
Appt letter mailed to pt.

## 2022-06-29 NOTE — Telephone Encounter (Signed)
Pt scheduled to see Dr. Hilarie Fredrickson 08/18/22 at 1:50pm. Please notify pt of appt.

## 2022-06-29 NOTE — Telephone Encounter (Signed)
Patient called states he received a letter from to schedule an OV instead of colonoscopy with Dr Hilarie Fredrickson. Unfortunately Dr. Hilarie Fredrickson is booked. Patient insisting on seeing Dr. Hilarie Fredrickson not a PA. Requesting a call back.

## 2022-07-22 ENCOUNTER — Inpatient Hospital Stay: Payer: Medicare HMO

## 2022-07-27 ENCOUNTER — Other Ambulatory Visit: Payer: Self-pay

## 2022-07-27 ENCOUNTER — Inpatient Hospital Stay: Payer: Medicare HMO | Attending: Hematology

## 2022-07-27 ENCOUNTER — Ambulatory Visit (HOSPITAL_COMMUNITY)
Admission: RE | Admit: 2022-07-27 | Discharge: 2022-07-27 | Disposition: A | Discharge status: Home / Self Care | Payer: Medicare HMO | Source: Ambulatory Visit | Attending: Hematology | Admitting: Hematology

## 2022-07-27 DIAGNOSIS — C3431 Malignant neoplasm of lower lobe, right bronchus or lung: Secondary | ICD-10-CM

## 2022-07-27 DIAGNOSIS — Z85038 Personal history of other malignant neoplasm of large intestine: Secondary | ICD-10-CM | POA: Insufficient documentation

## 2022-07-27 DIAGNOSIS — C182 Malignant neoplasm of ascending colon: Secondary | ICD-10-CM

## 2022-07-27 DIAGNOSIS — C3491 Malignant neoplasm of unspecified part of right bronchus or lung: Secondary | ICD-10-CM

## 2022-07-27 DIAGNOSIS — Z85118 Personal history of other malignant neoplasm of bronchus and lung: Secondary | ICD-10-CM | POA: Insufficient documentation

## 2022-07-27 DIAGNOSIS — R911 Solitary pulmonary nodule: Secondary | ICD-10-CM | POA: Diagnosis not present

## 2022-07-27 DIAGNOSIS — C3490 Malignant neoplasm of unspecified part of unspecified bronchus or lung: Secondary | ICD-10-CM | POA: Diagnosis not present

## 2022-07-27 DIAGNOSIS — J439 Emphysema, unspecified: Secondary | ICD-10-CM | POA: Diagnosis not present

## 2022-07-27 LAB — CBC WITH DIFFERENTIAL/PLATELET
Abs Immature Granulocytes: 0.01 10*3/uL (ref 0.00–0.07)
Basophils Absolute: 0 10*3/uL (ref 0.0–0.1)
Basophils Relative: 0 %
Eosinophils Absolute: 0 10*3/uL (ref 0.0–0.5)
Eosinophils Relative: 1 %
HCT: 45.1 % (ref 39.0–52.0)
Hemoglobin: 15.2 g/dL (ref 13.0–17.0)
Immature Granulocytes: 0 %
Lymphocytes Relative: 19 %
Lymphs Abs: 1 10*3/uL (ref 0.7–4.0)
MCH: 29.3 pg (ref 26.0–34.0)
MCHC: 33.7 g/dL (ref 30.0–36.0)
MCV: 87.1 fL (ref 80.0–100.0)
Monocytes Absolute: 0.6 10*3/uL (ref 0.1–1.0)
Monocytes Relative: 11 %
Neutro Abs: 3.7 10*3/uL (ref 1.7–7.7)
Neutrophils Relative %: 69 %
Platelets: 170 10*3/uL (ref 150–400)
RBC: 5.18 MIL/uL (ref 4.22–5.81)
RDW: 13.6 % (ref 11.5–15.5)
WBC: 5.3 10*3/uL (ref 4.0–10.5)
nRBC: 0 % (ref 0.0–0.2)

## 2022-07-27 LAB — COMPREHENSIVE METABOLIC PANEL
ALT: 9 U/L (ref 0–44)
AST: 15 U/L (ref 15–41)
Albumin: 4.2 g/dL (ref 3.5–5.0)
Alkaline Phosphatase: 89 U/L (ref 38–126)
Anion gap: 5 (ref 5–15)
BUN: 21 mg/dL (ref 8–23)
CO2: 34 mmol/L — ABNORMAL HIGH (ref 22–32)
Calcium: 9.5 mg/dL (ref 8.9–10.3)
Chloride: 97 mmol/L — ABNORMAL LOW (ref 98–111)
Creatinine, Ser: 1.09 mg/dL (ref 0.61–1.24)
GFR, Estimated: >60 mL/min (ref >60)
Glucose, Bld: 111 mg/dL — ABNORMAL HIGH (ref 70–99)
Potassium: 4.3 mmol/L (ref 3.5–5.1)
Sodium: 136 mmol/L (ref 135–145)
Total Bilirubin: 0.7 mg/dL (ref 0.3–1.2)
Total Protein: 6.9 g/dL (ref 6.5–8.1)

## 2022-07-27 LAB — CEA (ACCESS): CEA (CHCC): 2.27 ng/mL (ref 0.00–5.00)

## 2022-08-02 ENCOUNTER — Encounter: Payer: Self-pay | Admitting: Hematology

## 2022-08-02 ENCOUNTER — Inpatient Hospital Stay (HOSPITAL_BASED_OUTPATIENT_CLINIC_OR_DEPARTMENT_OTHER): Payer: Medicare HMO | Admitting: Hematology

## 2022-08-02 DIAGNOSIS — C182 Malignant neoplasm of ascending colon: Secondary | ICD-10-CM

## 2022-08-02 NOTE — Progress Notes (Signed)
[place]   [phone] [phone]   Clinic Follow up Note   Patient Care Team: Program, [facility] as PCP - General Lovena Le Champ Mungo, MD as PCP - Electrophysiology (Cardiology) Pyrtle, Lajuan Lines, MD as Consulting Physician (Gastroenterology) Allyn Kenner, MD as Consulting Physician (Dermatology) Dorothy Spark, MD as Consulting Physician (Cardiology) Levin Erp, [place] as Physician Assistant (Gastroenterology) Murrell Converse as Physician Assistant (Cardiology)  Date of Service:  08/02/2022  I connected with Vincent Mcclure on 08/02/2022 at 11:20 AM EDT by telephone visit and verified that I am speaking with the correct person using two identifiers.  I discussed the limitations, risks, security and privacy concerns of performing an evaluation and management service by telephone and the availability of in person appointments. I also discussed with the patient that there may be a patient responsible charge related to this service. The patient expressed understanding and agreed to proceed.   Other persons participating in the visit and their role in the encounter:  none  Patient's location:  home Provider's location:  my office  CHIEF COMPLAINT: f/u of colon and lung cancers  CURRENT THERAPY:  Surveillance  ASSESSMENT & PLAN:  Vincent Mcclure is a 88 y.o. male with   1. Non-small cell lung cancer in right lower lobe, squamous cell carcinoma, cT1bN0M0 stage IA -1.7 cm RLL pulmonary nodule seen on 05/30/20 surveillance CT, and hypermetabolic on 01/14/26 PET. No other distant metastasis. -biopsy on 07/02/20 showed squamous cell carcinoma. -s/p SBRT 07/30/20 - 08/08/20 with Dr Lisbeth Renshaw. He is now on surveillance.  -surveillance CT chest 07/27/22 showed NED. I reviewed the results with him today. -He is clinically doing well from a cancer standpoint. Labs from 10/17 reviewed, overall stable with no concerns. -Continue  surveillance. next CT in 6-12 months    2. Adenocarcinoma of right colon, invasive adenocarcinoma, stage IIIB (pT4a, pN1, cM0), MSS -Diagnosed in 05/2018. Treated with hemicolectomy and adjuvant Xeloda 07/31/18 - 01/29/19.  -Latest CT CAP from 09/28/21 showed only post-Covid and post-radiation changes.    PLAN: -lab and f/u in 3 months   No problem-specific Assessment & Plan notes found for this encounter.   SUMMARY OF ONCOLOGIC HISTORY: Oncology History Overview Note  Cancer Staging Cancer of ascending colon pT4apN1 s/p right colectomy 05/19/2018 Staging form: Colon and Rectum, AJCC 8th Edition - Pathologic stage from 05/19/2018: Stage IIIB (pT4a, pN1, cM0) - Signed by Truitt Merle, MD on 05/24/2018     Cancer of ascending colon pT4apN1 s/p right colectomy 05/19/2018  05/18/2018 Imaging   05/18/2018 CT AP IMPRESSION: 1. There appears to be a nearly obstructing lesion in the region of the hepatic flexure of the colon which is highly concerning for primary colonic neoplasm. Slight haziness of the surrounding soft tissues may be indicative of early local invasion. This appears partially obstructive as evidenced by dilatation of more proximal aspects of the small bowel and colon. 2. Left adrenal lesion is stable compared to prior studies, previously characterized as an adenoma. 3. Aortic atherosclerosis, in addition to left main and 3 vessel coronary artery disease. Status post median sternotomy for CABG. 4. Additional incidental findings, as above.   Aortic Atherosclerosis (ICD10-I70.0).   05/18/2018 - 05/30/2018 Hospital Admission   Admit date: 05/18/2018 Admission diagnosis: Colon Cancer Discharged on: 05/30/2018   05/19/2018 Cancer Staging   Staging form: Colon and Rectum, AJCC 8th Edition - Pathologic stage from 05/19/2018: Stage IIIB (pT4a, pN1, cM0) - Signed by  Truitt Merle, MD on 05/24/2018    05/19/2018 Pathology Results   05/19/2018 Surgical Pathology Diagnosis Colon, segmental resection  for tumor, right - INVASIVE COLORECTAL ADENOCARCINOMA, 5 CM. - CARCINOMA FOCALLY LESS THAN 0.1 CM FROM SEROSAL SURFACE. - MARGINS NOT INVOLVED. - METASTATIC CARCINOMA IN ONE OF EIGHT LYMPH NODES (1/9). - BENIGN APPENDIX.   05/19/2018 Surgery   EXPLORATORY LAPAROTOMY with right colectomy with Fanny Skates, MD   05/24/2018 Initial Diagnosis   Cancer of right colon ([place])   06/12/2018 Imaging     06/12/2018 CT CAP IMPRESSION: 1. Evidence all bowel perforation/anastomosed breakdown with large volume intraperitoneal free air fluid in the RIGHT upper quadrant adjacent and above the liver. 2. Large loculated fluid collection in the RIGHT anterior pelvis extending along the LEFT pericolic gutter consists with PERITONEAL ABSCESS. 3. Of note, no large volume intraperitoneal free fluid. Oral contrast passed through the RIGHT colon anastomosis without evidence of leak. Findings could indicate a prior anastomotic breakdown which has healed in the interval with subsequent abscess formation from the prior leak / breakdown.   06/17/2018 Procedure   06/17/2018 US Thoracentesis IMPRESSION: Successful ultrasound guided right thoracentesis yielding 1 L of pleural fluid. No pneumothorax on post-procedure chest x-ray.  No malignancy on pathology   06/20/2018 Imaging   06/20/2018 CT AP IMPRESSION: 1. Decompression of right upper quadrant air and fluid collection after percutaneous catheter drainage. There remains a tiny rim of fluid as well as small extraluminal gas bubbles in this region suspicious for continued leak from bowel. 2. Further decrease in size of residual abscess fluid collection in the pelvis after percutaneous catheter drainage. There remains some fluid around the drainage catheter in the right pelvis.   07/04/2018 Imaging   07/04/2018 CT AP IMPRESSION: 1. Further decompression of right perihepatic abscess with resolution of extraluminal air. 2. Relatively stable volume of fluid  surrounding the right pelvic drainage catheter.   07/05/2018 Imaging   07/05/2018 CT AP IMPRESSION: 1. No change compared to the CT scan performed 9 hours earlier. 2. Anterior right perihepatic space collection is decompressed by indwelling percutaneous drain, with no residual measurable collection in this location. 3. Thick walled right lower quadrant collection with indwelling percutaneous drain is stable. No new focal fluid collections. 4. Stable postsurgical changes from right hemicolectomy with no evidence of bowel obstruction. Stable reactive wall thickening in the sigmoid colon adjacent to the right lower quadrant collection. 5. Stable small to moderate dependent right pleural effusion. 6. Stable left adrenal adenoma. 7.  Aortic Atherosclerosis (ICD10-I70.0).   07/31/2018 - 01/21/2019 Chemotherapy   Adjuvant Xeloda 2000mg  bid, 7 days on, 7 days off.  Reduced to 2000mg  in the AM and 1500mg  in the PM due to diarrhea. Completed on 01/21/19    04/12/2019 Imaging   CT CAP W contrast 04/12/19  IMPRESSION: 1. Status post right hemicolectomy. 2. No definite metastatic disease in the chest, abdomen, or pelvis. 7 mm right lower lobe pulmonary nodule, obscured by atelectasis on previous chest CT and not included on multiple prior abdomen / pelvis CTs. Close follow-up recommended to ensure stability. 3.  Aortic Atherosclerois (ICD10-170.0)   09/04/2019 Imaging   CT AP W Contrast I MPRESSION: 1. No evidence of local colon cancer recurrence at the RIGHT hemicolectomy site. 2. No evidence of metastatic disease in the abdomen pelvis. 3. Mild nodularity at the LEFT lung base is favored benign. Recommend attention on routine surveillance 4. Aortic Atherosclerosis (ICD10-I70.0).   07/02/2020 Relapse/Recurrence   FINAL MICROSCOPIC DIAGNOSIS:  A. LUNG, RIGHT LOWER LOBE, NEEDLE CORE BIOPSY:  - Non-small cell carcinoma, see comment    COMMENT:   Immunohistochemical stains for  characterization of the tumor phenotype  are pending and will be reported in an addendum.  Dr. Tresa Moore reviewed the  case and concurs with the diagnosis. Dr. [name] was notified on 07/03/2020.    09/28/2021 Imaging   EXAM: CT CHEST, ABDOMEN, AND PELVIS WITH CONTRAST  IMPRESSION: 1. Further volume loss/consolidation medially in the superior segment left lower lobe probably related to prior radiation therapy. Underlying original lung nodule not readily distinguishable. 2. Suspected mild atypical infectious bronchiolitis in the lingula. Mild atelectasis in the left lower lobe. 3. Other imaging findings of potential clinical significance: Aortic Atherosclerosis (ICD10-I70.0). Coronary atherosclerosis. Systemic atherosclerosis. Multilevel lumbar impingement. Left adrenal adenoma. Right hemicolectomy.   Malignant neoplasm of bronchus of right lower lobe ([place])  05/30/2020 Imaging   CT CAP w contrast IMPRESSION: 1. Significant enlargement of a right lower lobe pulmonary nodule. Suspicious for primary bronchogenic carcinoma. Isolated pulmonary metastasis felt less likely. 2. Otherwise, no evidence of metastatic disease from patient's colon cancer. 3. Aortic atherosclerosis (ICD10-I70.0) and emphysema (ICD10-J43.9). 4. Left adrenal adenoma.   These results will be called to the ordering clinician or representative by the Radiologist Assistant, and communication documented in the PACS or [employer].   06/11/2020 PET scan   IMPRESSION: 1. The index nodule within the superior segment of the right lower lobe is FDG avid and worrisome for either primary bronchogenic neoplasm versus metastatic disease 2. Within the anteromedial aspect of the left maxillary sinus, in the region of the nasolacrimal duct, there is a focal soft tissue attenuating filling defect which exhibits intense FDG uptake within SUV max of 55.8. The although focal uptake within the paranasal sinuses may be seen with  sinusitis primary neoplasm of the sinuses or nasal lacrimal duct cannot be excluded. In light of the intense FDG uptake in this area further investigation with contrast enhanced CT or MRI of the soft tissues of neck to include the maxillary sinuses is recommended. 3.  Aortic Atherosclerosis (ICD10-I70.0). 4. Coronary artery calcifications 5. Prior granulomatous disease.     07/02/2020 Cancer Staging   Staging form: Lung, AJCC 8th Edition - Clinical stage from 07/02/2020: Stage IA2 (cT1b, cN0, cM0) - Signed by Truitt Merle, MD on 07/06/2020   07/06/2020 Initial Diagnosis   Non-small cell lung cancer, right ([place])   07/30/2020 - 08/08/2020 Radiation Therapy   SBRT to lung 07/16/20-08/04/20 with Dr Lisbeth Renshaw.    11/07/2020 Imaging   CT Chest  IMPRESSION: 1. Decreased size of dominant area of nodularity in the RIGHT chest with surrounding post treatment changes. 2. New area of nodularity along the pleural surface along the posterosuperior segment of the RIGHT lower lobe is suspicious but indeterminate at this time. Could consider short interval follow-up or PET imaging for further assessment as this area would be challenging for tissue sampling. There is also the possibility this area could somehow related to post treatment changes as well, though the morphologic features do raise suspicion. 3. Stable LEFT adrenal adenoma. 4. Changes of CABG. 5. Aortic atherosclerosis.   Aortic Atherosclerosis (ICD10-I70.0).   09/28/2021 Imaging   EXAM: CT CHEST, ABDOMEN, AND PELVIS WITH CONTRAST  IMPRESSION: 1. Further volume loss/consolidation medially in the superior segment left lower lobe probably related to prior radiation therapy. Underlying original lung nodule not readily distinguishable. 2. Suspected mild atypical infectious bronchiolitis in the lingula. Mild atelectasis in the left lower  lobe. 3. Other imaging findings of potential clinical significance: Aortic Atherosclerosis (ICD10-I70.0).  Coronary atherosclerosis. Systemic atherosclerosis. Multilevel lumbar impingement. Left adrenal adenoma. Right hemicolectomy.      INTERVAL HISTORY:  Vincent Mcclure was contacted for a follow up of colon and lung cancers. He was last seen by me on 06/03/22. He reports he is doing well overall. He denies any changes except several falls since his last visit.    All other systems were reviewed with the patient and are negative.  MEDICAL HISTORY:  Past Medical History:  Diagnosis Date   Alcohol abuse    Anemia    Arthritis    hands   Blood transfusion without reported diagnosis    CAD (coronary artery disease)    a. NSTEMI 2018 s/p CABG.   CHF (congestive heart failure) ([name])    Chronic low back pain 10/14/2011   colon ca dx'd 05/18/18   colon cancer   Colon polyps    hyperplastic (2004, 2010) and adenomatous (1990).     COLONIC POLYPS, HX OF 03/05/2008   Esophageal stricture    hx of   Gastric ulcer    GERD 03/05/2008   GERD (gastroesophageal reflux disease) 1994   associated peptic strictures   HYPERLIPIDEMIA 03/05/2008   HYPERTENSION 03/05/2008   Hypertension    IBS 03/05/2008   Impaired glucose tolerance 10/12/2011   Iron deficiency anemia    LEG CRAMPS 09/02/2010   Lung cancer ([name]) dx'd 06/2020   Myocardial infarction [facility])    May 2018   PAD (peripheral artery disease) ([place]) 2013   PAF (paroxysmal atrial fibrillation) ([place])    PERIPHERAL EDEMA 09/02/2010   PERIPHERAL NEUROPATHY 03/05/2008   Premature atrial contraction    Prostatitis    hx of   PVC's (premature ventricular contractions)    Sinus brady-tachy syndrome ([place])    VARICOSE VEINS, LOWER EXTREMITIES 06/09/2009    SURGICAL HISTORY: Past Surgical History:  Procedure Laterality Date   CATARACT EXTRACTION  bilat   COLECTOMY     COLONOSCOPY     CORONARY ARTERY BYPASS GRAFT N/A 02/15/2017   Procedure: CORONARY ARTERY BYPASS GRAFTING times three  with left internal mammary harvest and endoscopic harvest of Right  SVG. Grafts of LIMA to  LAD, SVG to Distal Circ, and to First Diag.;  Surgeon: Grace Isaac, MD;  Location: [place];  Service: Open Heart Surgery;  Laterality: N/A;   ESOPHAGOGASTRODUODENOSCOPY N/A 11/14/2014   Procedure: ESOPHAGOGASTRODUODENOSCOPY (EGD);  Surgeon: Jerene Bears, MD;  Location: [facility] ENDOSCOPY;  Service: Endoscopy;  Laterality: N/A;   IR RADIOLOGIST EVAL & MGMT  07/04/2018   IR RADIOLOGIST EVAL & MGMT  07/18/2018   LAPAROTOMY N/A 05/19/2018   Procedure: EXPLORATORY LAPAROTOMY RIGHT COLECTOMY;  Surgeon: Fanny Skates, MD;  Location: [facility] ORS;  Service: General;  Laterality: N/A;   LEFT HEART CATH AND CORONARY ANGIOGRAPHY N/A 02/14/2017   Procedure: Left Heart Cath and Coronary Angiography;  Surgeon: Belva Crome, MD;  Location: [place] CV LAB;  Service: Cardiovascular;  Laterality: N/A;   LUMBAR SPINE SURGERY  11/2008   Dr Joya Salm   TEE WITHOUT CARDIOVERSION N/A 02/15/2017   Procedure: TRANSESOPHAGEAL ECHOCARDIOGRAM (TEE);  Surgeon: Grace Isaac, MD;  Location: [place];  Service: Open Heart Surgery;  Laterality: N/A;   UPPER GASTROINTESTINAL ENDOSCOPY      I have reviewed the social history and family history with the patient and they are unchanged from previous note.  ALLERGIES:  has No Known Allergies.  MEDICATIONS:  Current Outpatient Medications  Medication Sig Dispense Refill   acetaminophen (TYLENOL) 500 MG tablet Take 1 tablet (500 mg total) by mouth every 6 (six) hours as needed for mild pain.  0   apixaban (ELIQUIS) 5 MG TABS tablet Take 1 tablet (5 mg total) by mouth 2 (two) times daily. 60 tablet 5   bacitracin 500 UNIT/GM ointment Apply 1 application topically daily as needed for wound care.     Cholecalciferol (VITAMIN D-3 PO) Take 2 capsules by mouth daily.      hydrochlorothiazide (HYDRODIURIL) 25 MG tablet Take 1 tablet (25 mg total) by mouth daily. 90 tablet 2   metoprolol succinate (TOPROL XL) 25 MG 24 hr tablet Take 1 tablet (25 mg total) by mouth daily.  90 tablet 3   nitroGLYCERIN (NITROSTAT) 0.4 MG SL tablet Place 1 tablet (0.4 mg total) under the tongue every 5 (five) minutes as needed for chest pain (or tightness). 25 tablet 3   pravastatin (PRAVACHOL) 40 MG tablet Take 1 tablet (40 mg total) by mouth at bedtime. 90 tablet 2   silver sulfADIAZINE (SILVADENE) 1 % cream Apply 1 Application topically daily. 50 g 0   No current facility-administered medications for this visit.    PHYSICAL EXAMINATION: ECOG PERFORMANCE STATUS: 1 - Symptomatic but completely ambulatory  There were no vitals filed for this visit. Wt Readings from Last 3 Encounters:  06/03/22 215 lb 11.2 oz (97.8 kg)  05/03/22 216 lb (98 kg)  02/10/22 214 lb 6.4 oz (97.3 kg)     No vitals taken today, Exam not performed today  LABORATORY DATA:  I have reviewed the data as listed    Latest Ref Rng & Units 07/27/2022   11:39 AM 06/03/2022   10:26 AM 09/28/2021   12:02 PM  CBC  WBC 4.0 - 10.5 K/uL 5.3  5.0  5.6   Hemoglobin 13.0 - 17.0 g/dL 15.2  14.8  14.4   Hematocrit 39.0 - 52.0 % 45.1  44.1  43.7   Platelets 150 - 400 K/uL 170  156  214         Latest Ref Rng & Units 07/27/2022   11:39 AM 06/03/2022   10:26 AM 09/28/2021   12:02 PM  CMP  Glucose 70 - 99 mg/dL 111  99  99   BUN 8 - 23 mg/dL 21  17  15    Creatinine 0.61 - 1.24 mg/dL 1.09  0.92  1.03   Sodium 135 - 145 mmol/L 136  136  136   Potassium 3.5 - 5.1 mmol/L 4.3  3.8  3.6   Chloride 98 - 111 mmol/L 97  99  98   CO2 22 - 32 mmol/L 34  31  28   Calcium 8.9 - 10.3 mg/dL 9.5  9.8  9.2   Total Protein 6.5 - 8.1 g/dL 6.9  6.7  7.1   Total Bilirubin 0.3 - 1.2 mg/dL 0.7  0.8  0.7   Alkaline Phos 38 - 126 U/L 89  94  77   AST 15 - 41 U/L 15  14  14    ALT 0 - 44 U/L 9  8  10        RADIOGRAPHIC STUDIES: I have personally reviewed the radiological images as listed and agreed with the findings in the report. No results found.    No orders of the defined types were placed in this encounter.  All  questions were answered. The patient knows to call the clinic  with any problems, questions or concerns. No barriers to learning was detected. The total time spent in the appointment was 22 minutes.     Truitt Merle, MD 08/02/2022   I, Wilburn Mylar, am acting as scribe for Truitt Merle, MD.   I have reviewed the above documentation for accuracy and completeness, and I agree with the above.

## 2022-08-03 ENCOUNTER — Telehealth: Payer: Self-pay | Admitting: Hematology

## 2022-08-03 NOTE — Telephone Encounter (Signed)
Left patient a voicemail confirming upcoming appointments

## 2022-08-12 NOTE — Progress Notes (Deleted)
Cardiology Office Note:    Date:  08/12/2022   ID:  Vincent Mcclure, DOB [birth year], MRN [id number]  PCP:  Program, [place] Providers Cardiologist:  None Cardiology APP:  Imogene Burn, PA-C  Electrophysiologist:  Cristopher Peru, MD  {  Referring MD: Program, [place]*    History of Present Illness:    Vincent Mcclure is a 88 y.o. male with a hx of CAD (NSTEMI 2018 s/p CABGx3), PAF, PACs, sinus bradycardia, HTN, HLD (followed by PCP), gastric ulcers with GIB 2016 2/2 ETOH, tobacco abuse, colon CA s/p surgery, NSCLC (radiation) who was previously followed by Dr. Meda Coffee who now presents to clinic for follow-up.  Per review of the record, the patient underwent nuclear stress test in in 06/2020 for chest pain that was negative for ischemia. He takes sporadic SL NTG for angina. He was started on isosorbide 15mg  daily at that time but eventually opted not to take it. In general he is wary about starting new medicines. In 10/2020 he saw Dr. Meda Coffee for an episode of syncope that had happened 2 months prior. HR was in the low 50s so Toprol was reduced and he was restarted on baby ASA (he had stopped this after reading about it on the internet). Event monitor showed predominantly NSR but 18% PAF burden and frequent PACs, rare PVCs. Average HR overall 93bpm, minimum 61, max 183. Aspirin was stopped and he was started on Eliquis. He saw Dr. Lovena Le in 11/2020 for potential tachy-brady syndrome. He was not felt to require any intervention beyond BB/Eliquis at this time. Dr. Lovena Le increased Toprol back to 50mg  daily, however, the patient chose to go back on 25mg  XL.  TTE 05/2021 showed LVEF 60-65%, G2DD, normal RV, mild MR, prominent crista terminalis with no evidence of a mass.   Was last seen 02/2022 where he reported an episode of chest pain 4 months prior. He had not had recurrence and he declined further imaging at that time.  Today, ***   Past  Medical History:  Diagnosis Date   Alcohol abuse    Anemia    Arthritis    hands   Blood transfusion without reported diagnosis    CAD (coronary artery disease)    a. NSTEMI 2018 s/p CABG.   CHF (congestive heart failure) ([name])    Chronic low back pain 10/14/2011   colon ca dx'd 05/18/18   colon cancer   Colon polyps    hyperplastic (2004, 2010) and adenomatous (1990).     COLONIC POLYPS, HX OF 03/05/2008   Esophageal stricture    hx of   Gastric ulcer    GERD 03/05/2008   GERD (gastroesophageal reflux disease) 1994   associated peptic strictures   HYPERLIPIDEMIA 03/05/2008   HYPERTENSION 03/05/2008   Hypertension    IBS 03/05/2008   Impaired glucose tolerance 10/12/2011   Iron deficiency anemia    LEG CRAMPS 09/02/2010   Lung cancer ([place]) dx'd 06/2020   Myocardial infarction [facility])    May 2018   PAD (peripheral artery disease) ([place]) 2013   PAF (paroxysmal atrial fibrillation) ([place])    PERIPHERAL EDEMA 09/02/2010   PERIPHERAL NEUROPATHY 03/05/2008   Premature atrial contraction    Prostatitis    hx of   PVC's (premature ventricular contractions)    Sinus brady-tachy syndrome ([name])    VARICOSE VEINS, LOWER EXTREMITIES 06/09/2009    Past Surgical History:  Procedure Laterality Date   CATARACT EXTRACTION  bilat   COLECTOMY     COLONOSCOPY     CORONARY ARTERY BYPASS GRAFT N/A 02/15/2017   Procedure: CORONARY ARTERY BYPASS GRAFTING times three  with left internal mammary harvest and endoscopic harvest of Right SVG. Grafts of LIMA to  LAD, SVG to Distal Circ, and to First Diag.;  Surgeon: Grace Isaac, MD;  Location: [place];  Service: Open Heart Surgery;  Laterality: N/A;   ESOPHAGOGASTRODUODENOSCOPY N/A 11/14/2014   Procedure: ESOPHAGOGASTRODUODENOSCOPY (EGD);  Surgeon: Jerene Bears, MD;  Location: [facility] END[facility]COPY;  Service: Endoscopy;  Laterality: N/A;   IR RADIOLOGIST EVAL & MGMT  07/04/2018   IR RADIOLOGIST EVAL & MGMT  07/18/2018   LAPAROTOMY N/A 05/19/2018   Procedure: EXPLORATORY  LAPAROTOMY RIGHT COLECTOMY;  Surgeon: Fanny Skates, MD;  Location: [facility];  Service: General;  Laterality: N/A;   LEFT HEART CATH AND CORONARY ANGIOGRAPHY N/A 02/14/2017   Procedure: Left Heart Cath and Coronary Angiography;  Surgeon: Belva Crome, MD;  Location: [place] [facility];  Service: Cardiovascular;  Laterality: N/A;   LUMBAR SPINE SURGERY  11/2008   Dr Joya Salm   TEE WITHOUT CARDIOVERSION N/A 02/15/2017   Procedure: TRANSESOPHAGEAL ECHOCARDIOGRAM (TEE);  Surgeon: Grace Isaac, MD;  Location: [place];  Service: Open Heart Surgery;  Laterality: N/A;   UPPER GASTROINTESTINAL ENDOSCOPY      Current Medications: No outpatient medications have been marked as taking for the 08/13/22 encounter (Appointment) with Freada Bergeron, MD.     Allergies:   Patient has no known allergies.   Social History   Socioeconomic History   Marital status: Married    Spouse name: Not on file   Number of children: 4   Years of education: Not on file   Highest education level: Not on file  Occupational History   Occupation: Retired Engineer, maintenance  Tobacco Use   Smoking status: Former    Packs/day: 1.25    Years: 50.00    Total pack years: 62.50    Types: Cigarettes    Quit date: 11/11/2008    Years since quitting: 13.7   Smokeless tobacco: Former    Types: Chew   Tobacco comments:    smoked off and on for years  Vaping Use   Vaping Use: Never used  Substance and Sexual Activity   Alcohol use: Yes    Alcohol/week: 6.0 standard drinks of alcohol    Types: 2 Glasses of wine, 4 Cans of beer per week    Comment: vodka   Drug use: No   Sexual activity: Never  Other Topics Concern   Not on file  Social History Narrative   ** Merged History Encounter **       4 sons   Social Determinants of Health   Financial Resource Strain: Not on file  Food Insecurity: Not on file  Transportation Needs: Not on file  Physical Activity: Not on file  Stress: Not on file  Social Connections: Not  on file     Family History: The patient's family history includes Cancer in his mother; Diabetes in his father and sister; Heart disease in his father; Hypertension in his father; Stroke in his father. There is no history of Stomach cancer, Pancreatic cancer, Colon cancer, Esophageal cancer, or Rectal cancer.  ROS:   Review of Systems  Constitutional:  Negative for diaphoresis and malaise/fatigue.  HENT:  Positive for hearing loss. Negative for sinus pain.   Eyes:  Negative for blurred vision and pain.  Respiratory:  Positive for shortness of breath. Negative for cough.   Cardiovascular:  Positive for chest pain and leg swelling. Negative for palpitations, orthopnea, claudication and PND.  Gastrointestinal:  Negative for abdominal pain, blood in stool, constipation and diarrhea.  Genitourinary:  Negative for dysuria and frequency.  Musculoskeletal:  Positive for joint pain. Negative for back pain and neck pain.  Neurological:  Negative for dizziness, tremors, weakness and headaches.  Endo/Heme/Allergies:  Negative for polydipsia.  Psychiatric/Behavioral:  Negative for depression and suicidal ideas.      EKGs/Labs/Other Studies Reviewed:    The following studies were reviewed today:  CT Chest/Abdomen/Pelvis 09/28/2021: IMPRESSION: 1. Further volume loss/consolidation medially in the superior segment left lower lobe probably related to prior radiation therapy. Underlying original lung nodule not readily distinguishable. 2. Suspected mild atypical infectious bronchiolitis in the lingula. Mild atelectasis in the left lower lobe. 3. Other imaging findings of potential clinical significance: Aortic Atherosclerosis (ICD10-I70.0). Coronary atherosclerosis. Systemic atherosclerosis. Multilevel lumbar impingement. Left adrenal adenoma. Right hemicolectomy.  TTE 05/14/2021: IMPRESSIONS    1. Left ventricular ejection fraction, by estimation, is 60 to 65%. The  left ventricle has normal  function. The left ventricle has no regional  wall motion abnormalities. Left ventricular diastolic parameters are  consistent with Grade II diastolic  dysfunction (pseudonormalization). Elevated left atrial pressure.   2. Right ventricular systolic function is normal. The right ventricular  size is normal. There is normal pulmonary artery systolic pressure.   3. Left atrial size was mildly dilated.   4. There is no mass in the right atrium. Right atrial size was mildly  dilated.   5. The mitral valve is normal in structure. Mild mitral valve  regurgitation. No evidence of mitral stenosis.   6. The aortic valve is normal in structure. Aortic valve regurgitation is  not visualized. Mild aortic valve sclerosis is present, with no evidence  of aortic valve stenosis.   7. The inferior vena cava is normal in size with greater than 50%  respiratory variability, suggesting right atrial pressure of 3 mmHg.   Comparison(s): No significant change from prior study. Prior images  reviewed side by side.   Monitor 11/2020 Patient had a min HR of 61 bpm, max HR of 183 bpm, and avg HR of 93 bpm. Predominant underlying rhythm was Sinus Rhythm. Atrial Fibrillation occurred (18% burden), ranging from 67-183 bpm (avg of 113 bpm), the longest lasting 4 hours 4 mins with an avg rate of 111 bpm. Isolated PACs were frequent (12.3%, [id number]), PAC Couplets and triplets were frequent (5.0% and 4.9%). Isolated PVCs were rare (<1.0%), PVC Couplets were rare (<1.0%, 72).   Predominant underlying rhythm was Sinus Rhythm. Atrial Fibrillation occurred (18% burden), ranging from 67-183 bpm (avg of 113 bpm), the longest lasting 4 hours 4 mins with an avg rate of 111 bpm. Frequent PACs.   2D Echo 05/2018 - Left ventricle: The cavity size was normal. Wall thickness was    increased in a pattern of mild LVH. Systolic function was normal.    The estimated ejection fraction was in the range of 55% to 60%.    Wall motion  was normal; there were no regional wall motion    abnormalities. Doppler parameters are consistent with abnormal    left ventricular relaxation (grade 1 diastolic dysfunction).  - Aortic valve: Mildly calcified annulus. Trileaflet; mildly    thickened leaflets. Valve area (VTI): 4.6 cm^2. Valve area    (Vmax): 4.22 cm^2.  - Right atrium: There is a  1.2 by 1.6 cm echodense mass in the    right atrium, overall poorly visualized but best seen in the    apical 4 chamber views. The subcostal views are limited . When    reviewing the 02/15/17 TEE this area is also poorly visualized, but    does appear to be due to a prominent eustachian valve, a benign    finding.  - Pulmonary arteries: Systolic pressure was mildly increased. PA    peak pressure: 37 mm Hg (S).  - Systemic veins: IVC is small suggesting low RA pressure and    hypovolemia.   Cardiac Cath 02/2017 Heavily calcified left main and proximal to mid LAD. Eccentric 75% distal left main Mid LAD [place] 0, 1, 1 bifurcation stenosis with 90% LAD and 75% second diagonal. Subtotal occlusion, 99%, of the moderate sized first diagonal, possibly the source of the patient's presentation. Ostial 80% circumflex. The distal circumflex is relatively small with 2 small to intermediate size branches distally. Mid to distal 70% PDA. The right coronary is dominant and otherwise widely patent. Mild hypokinesis mid anterior wall, likely due to occlusion of the first diagonal. Overall LV function is normal with EF 55%. Normal left ventricular filling pressures.   Recommendations:   Surgical consultation via TCTS for consideration of bypass surgery due to left main coronary disease and complex mid LAD stenoses.      EKG:  EKG is personally reviewed. 02/10/2022: Sinus rhythm. Rate 71 bpm. PACs. 04/22/2021: no EKG.   Recent Labs: 07/27/2022: ALT 9; BUN 21; Creatinine, Ser 1.09; Hemoglobin 15.2; Platelets 170; Potassium 4.3; Sodium 136   Recent Lipid  Panel    Component Value Date/Time   CHOL 128 01/13/2021 1019   TRIG 150 (H) 01/13/2021 1019   HDL 48 01/13/2021 1019   CHOLHDL 2.7 01/13/2021 1019   CHOLHDL 3.4 02/12/2017 0657   VLDL 17 02/12/2017 0657   LDLCALC 54 01/13/2021 1019   LDLDIRECT 64 04/24/2019 0917   LDLDIRECT 133.2 10/14/2011 1106     Risk Assessment/Calculations:    CHA2DS2-VASc Score =    {This indicates a  % annual risk of stroke. The patient's score is based upon:          Physical Exam:    VS:  There were no vitals taken for this visit.    Wt Readings from Last 3 Encounters:  06/03/22 215 lb 11.2 oz (97.8 kg)  05/03/22 216 lb (98 kg)  02/10/22 214 lb 6.4 oz (97.3 kg)     GEN:  Elderly male, comfortable, NAD HEENT: Normal NECK: No JVD; No carotid bruits CARDIAC: RRR, soft systolic murmur. No rubs, gallops RESPIRATORY:  Diminished but clear  ABDOMEN: Soft, non-tender, non-distended MUSCULOSKELETAL: Trace LE edema, warm SKIN: Warm and dry NEUROLOGIC:  Alert and oriented x 3 PSYCHIATRIC:  Normal affect   ASSESSMENT:    No diagnosis found.   PLAN:    In order of problems listed above:  #CAD s/p CABG in 2018: TTE 05/2021 with LVEF 60-65%, G2DD, normal RV, mild MR. Currently doing well without anginal symptoms. -Continue metop 25mg  XL daily -Continue pravastatin 40mg  daily -Nitro prn -Not on ASA due to need for apixaban  #Paroxymsal Afib: Doing well and asymptomatic. CHADs-vasc 4. Tolerating apixaban without bleeding issues.  -Continue apixaban 5mg  BID (has has been taking half dose BID and recommended he increase back to 5mg  BID) -Continue metop 25mg  XL daily -Follow-up with Dr. Lovena Le as scheudled  #HTN: Well controlled. -Continue metop 25mg  XL daily -  Continue HCTZ 25mg  daily  #HLD: LDL at goal 54 in 01/2021 -Continue pravastatin 40mg  daily  #LE edema: Chronic and overall stable. TTE 05/2021 with normal LVEF 60-65%.  -Continue HCTZ    Follow-up in 6 months.  Medication  Adjustments/Labs and Tests Ordered: Current medicines are reviewed at length with the patient today.  Concerns regarding medicines are outlined above.   No orders of the defined types were placed in this encounter.  No orders of the defined types were placed in this encounter.  There are no Patient Instructions on file for this visit.    Signed, Freada Bergeron, MD  08/12/2022 3:18 PM    [place]

## 2022-08-13 ENCOUNTER — Encounter: Payer: Self-pay | Admitting: Cardiology

## 2022-08-13 ENCOUNTER — Ambulatory Visit: Payer: Medicare HMO | Attending: Cardiology | Admitting: Cardiology

## 2022-08-13 VITALS — BP 143/87 | HR 89 | Ht 72.0 in | Wt 217.0 lb

## 2022-08-13 DIAGNOSIS — I480 Paroxysmal atrial fibrillation: Secondary | ICD-10-CM

## 2022-08-13 DIAGNOSIS — I214 Non-ST elevation (NSTEMI) myocardial infarction: Secondary | ICD-10-CM | POA: Diagnosis not present

## 2022-08-13 DIAGNOSIS — I25118 Atherosclerotic heart disease of native coronary artery with other forms of angina pectoris: Secondary | ICD-10-CM

## 2022-08-13 DIAGNOSIS — Z951 Presence of aortocoronary bypass graft: Secondary | ICD-10-CM

## 2022-08-13 DIAGNOSIS — R600 Localized edema: Secondary | ICD-10-CM | POA: Diagnosis not present

## 2022-08-13 DIAGNOSIS — I10 Essential (primary) hypertension: Secondary | ICD-10-CM | POA: Diagnosis not present

## 2022-08-13 DIAGNOSIS — E782 Mixed hyperlipidemia: Secondary | ICD-10-CM

## 2022-08-13 NOTE — Progress Notes (Signed)
Cardiology Office Note:    Date:  08/13/2022   ID:  Vincent Mcclure, DOB 1932-08-07, MRN [id number]  PCP:  Program, [place] [facility] Cardiologist:  Freada Bergeron, MD Cardiology APP:  Imogene Burn, PA-C  Electrophysiologist:  Cristopher Peru, MD  {  Referring MD: Program, [place] Fa*    History of Present Illness:    Vincent Mcclure is a 88 y.o. male with a hx of CAD (NSTEMI 2018 s/p CABGx3), PAF, PACs, sinus bradycardia, HTN, HLD (followed by PCP), gastric ulcers with GIB 2016 2/2 ETOH, tobacco abuse, colon CA s/p surgery, NSCLC (radiation) who was previously followed by Dr. Meda Coffee who now presents to clinic for follow-up.  Per review of the record, the patient underwent nuclear stress test in in 06/2020 for chest pain that was negative for ischemia. He takes sporadic SL NTG for angina. He was started on isosorbide 15mg  daily at that time but eventually opted not to take it. In general he is wary about starting new medicines. In 10/2020 he saw Dr. Meda Coffee for an episode of syncope that had happened 2 months prior. HR was in the low 50s so Toprol was reduced and he was restarted on baby ASA (he had stopped this after reading about it on the internet). Event monitor showed predominantly NSR but 18% PAF burden and frequent PACs, rare PVCs. Average HR overall 93bpm, minimum 61, max 183. Aspirin was stopped and he was started on Eliquis. He saw Dr. Lovena Le in 11/2020 for potential tachy-brady syndrome. He was not felt to require any intervention beyond BB/Eliquis at this time. Dr. Lovena Le increased Toprol back to 50mg  daily, however, the patient chose to go back on 25mg  XL.  TTE 05/2021 showed LVEF 60-65%, G2DD, normal RV, mild MR, prominent crista terminalis with no evidence of a mass.   Was last seen 02/2022 where he reported an episode of chest pain 4 months prior. He had not had recurrence and he declined further imaging at that  time.  Today, the patient states he overall feels well. Reports one episode of chest pain that awoke him from sleep. Took nitro at that time with complete resolution of symptoms. Declined further ischemic testing at this time.  He is otherwise doing well. Continues to only take 2.5mg  of apxiaban BID due to easy bruising and does not wish to change it. Has been compliant with BP meds with most blood pressure reading in 130s. Denies any SOB, lightheadedness or palpitations. States he would like to minimize meds/interventions/labs unless he is feeling poorly.  He denies any palpitations, shortness of breath, or peripheral edema. No lightheadedness, syncope, orthopnea, or PND.  Past Medical History:  Diagnosis Date   Alcohol abuse    Anemia    Arthritis    hands   Blood transfusion without reported diagnosis    CAD (coronary artery disease)    a. NSTEMI 2018 s/p CABG.   CHF (congestive heart failure) ([name])    Chronic low back pain 10/14/2011   colon ca dx'd 05/18/18   colon cancer   Colon polyps    hyperplastic (2004, 2010) and adenomatous (1990).     COLONIC POLYPS, HX OF 03/05/2008   Esophageal stricture    hx of   Gastric ulcer    GERD 03/05/2008   GERD (gastroesophageal reflux disease) 1994   associated peptic strictures   HYPERLIPIDEMIA 03/05/2008   HYPERTENSION 03/05/2008   Hypertension    IBS 03/05/2008  Impaired glucose tolerance 10/12/2011   Iron deficiency anemia    LEG CRAMPS 09/02/2010   Lung cancer ([place]) dx'd 06/2020   Myocardial infarction [facility])    May 2018   PAD (peripheral artery disease) ([place]) 2013   PAF (paroxysmal atrial fibrillation) ([place])    PERIPHERAL EDEMA 09/02/2010   PERIPHERAL NEUROPATHY 03/05/2008   Premature atrial contraction    Prostatitis    hx of   PVC's (premature ventricular contractions)    Sinus brady-tachy syndrome ([place])    VARICOSE VEINS, LOWER EXTREMITIES 06/09/2009    Past Surgical History:  Procedure Laterality Date   CATARACT EXTRACTION   bilat   COLECTOMY     COLONOSCOPY     CORONARY ARTERY BYPASS GRAFT N/A 02/15/2017   Procedure: CORONARY ARTERY BYPASS GRAFTING times three  with left internal mammary harvest and endoscopic harvest of Right SVG. Grafts of LIMA to  LAD, SVG to Distal Circ, and to First Diag.;  Surgeon: Grace Isaac, MD;  Location: [facility];  Service: Open Heart Surgery;  Laterality: N/A;   ESOPHAGOGASTRODUODENOSCOPY N/A 11/14/2014   Procedure: ESOPHAGOGASTRODUODENOSCOPY (EGD);  Surgeon: Jerene Bears, MD;  Location: [facility] ENDOSCOPY;  Service: Endoscopy;  Laterality: N/A;   IR RADIOLOGIST EVAL & MGMT  07/04/2018   IR RADIOLOGIST EVAL & MGMT  07/18/2018   LAPAROTOMY N/A 05/19/2018   Procedure: EXPLORATORY LAPAROTOMY RIGHT COLECTOMY;  Surgeon: Fanny Skates, MD;  Location: [facility] ORS;  Service: General;  Laterality: N/A;   LEFT HEART CATH AND CORONARY ANGIOGRAPHY N/A 02/14/2017   Procedure: Left Heart Cath and Coronary Angiography;  Surgeon: Belva Crome, MD;  Location: [facility];  Service: Cardiovascular;  Laterality: N/A;   LUMBAR SPINE SURGERY  11/2008   Dr Joya Salm   TEE WITHOUT CARDIOVERSION N/A 02/15/2017   Procedure: TRANSESOPHAGEAL ECHOCARDIOGRAM (TEE);  Surgeon: Grace Isaac, MD;  Location: [facility];  Service: Open Heart Surgery;  Laterality: N/A;   UPPER GASTROINTESTINAL ENDOSCOPY      Current Medications: Current Meds  Medication Sig   acetaminophen (TYLENOL) 500 MG tablet Take 1 tablet (500 mg total) by mouth every 6 (six) hours as needed for mild pain.   apixaban (ELIQUIS) 5 MG TABS tablet Take 1 tablet (5 mg total) by mouth 2 (two) times daily.   bacitracin 500 UNIT/GM ointment Apply 1 application topically daily as needed for wound care.   Cholecalciferol (VITAMIN D-3 PO) Take 2 capsules by mouth daily.    hydrochlorothiazide (HYDRODIURIL) 25 MG tablet Take 1 tablet (25 mg total) by mouth daily.   metoprolol succinate (TOPROL XL) 25 MG 24 hr tablet Take 1 tablet (25 mg total) by mouth daily.    nitroGLYCERIN (NITROSTAT) 0.4 MG SL tablet Place 1 tablet (0.4 mg total) under the tongue every 5 (five) minutes as needed for chest pain (or tightness).   pravastatin (PRAVACHOL) 40 MG tablet Take 1 tablet (40 mg total) by mouth at bedtime.   silver sulfADIAZINE (SILVADENE) 1 % cream Apply 1 Application topically daily.     Allergies:   Patient has no known allergies.   Social History   Socioeconomic History   Marital status: Married    Spouse name: Not on file   Number of children: 4   Years of education: Not on file   Highest education level: Not on file  Occupational History   Occupation: Retired Engineer, maintenance  Tobacco Use   Smoking status: Former    Packs/day: 1.25    Years: 50.00  Total pack years: 62.50    Types: Cigarettes    Quit date: 11/11/2008    Years since quitting: 13.7   Smokeless tobacco: Former    Types: Chew   Tobacco comments:    smoked off and on for years  Vaping Use   Vaping Use: Never used  Substance and Sexual Activity   Alcohol use: Yes    Alcohol/week: 6.0 standard drinks of alcohol    Types: 2 Glasses of wine, 4 Cans of beer per week    Comment: vodka   Drug use: No   Sexual activity: Never  Other Topics Concern   Not on file  Social History Narrative   ** Merged History Encounter **       4 sons   Social Determinants of Health   Financial Resource Strain: Not on file  Food Insecurity: Not on file  Transportation Needs: Not on file  Physical Activity: Not on file  Stress: Not on file  Social Connections: Not on file     Family History: The patient's family history includes Cancer in his mother; Diabetes in his father and sister; Heart disease in his father; Hypertension in his father; Stroke in his father. There is no history of Stomach cancer, Pancreatic cancer, Colon cancer, Esophageal cancer, or Rectal cancer.  ROS:   Review of Systems  Constitutional:  Negative for diaphoresis and malaise/fatigue.  HENT:  Positive for  hearing loss. Negative for sinus pain.   Eyes:  Negative for blurred vision and pain.  Respiratory:  Positive for shortness of breath. Negative for cough.   Cardiovascular:  Positive for chest pain and leg swelling. Negative for palpitations, orthopnea, claudication and PND.  Gastrointestinal:  Negative for abdominal pain, blood in stool, constipation and diarrhea.  Genitourinary:  Negative for dysuria and frequency.  Musculoskeletal:  Positive for joint pain. Negative for back pain and neck pain.  Neurological:  Positive for headaches. Negative for dizziness, tremors and weakness.  Endo/Heme/Allergies:  Negative for polydipsia. Bruises/bleeds easily.  Psychiatric/Behavioral:  Negative for depression and suicidal ideas.      EKGs/Labs/Other Studies Reviewed:    The following studies were reviewed today:  CT Chest 07/27/2022: FINDINGS: Cardiovascular: Atherosclerotic calcification of the aorta and aortic valve. Heart size normal. No pericardial effusion.   Mediastinum/Nodes: Calcified mediastinal and left hilar lymph nodes. No pathologically enlarged mediastinal or axillary lymph nodes. Hilar regions are difficult to evaluate without IV contrast. Esophagus is grossly unremarkable.   Lungs/Pleura: Centrilobular emphysema. Calcified granulomas. Subpleural scarring in the apical segment right upper lobe. Post treatment pulmonary retraction and architectural distortion in the posterior segment right upper lobe and superior segment right lower lobe, as before. Scarring and volume loss in the left lower lobe. No pleural fluid. Debris is seen in the bronchus intermedius.   Upper Abdomen: Visualized portions of the liver and gallbladder are unremarkable. Right adrenal nodule measures 2.0 cm and 1 Hounsfield unit. Left adrenal nodule measures 2.3 cm and 8 Hounsfield units. No follow-up necessary. Visualized portions of the spleen, pancreas, stomach and bowel are grossly unremarkable. No  upper abdominal adenopathy.   Musculoskeletal: Degenerative changes in the spine. No worrisome lytic or sclerotic lesions.   IMPRESSION: 1. No evidence of recurrent or metastatic disease. 2. Bilateral adrenal adenomas. 3.  Aortic atherosclerosis (ICD10-I70.0). 4.  Emphysema (ICD10-J43.9).   CT Chest/Abdomen/Pelvis 09/28/2021: IMPRESSION: 1. Further volume loss/consolidation medially in the superior segment left lower lobe probably related to prior radiation therapy. Underlying original lung nodule not readily distinguishable.  2. Suspected mild atypical infectious bronchiolitis in the lingula. Mild atelectasis in the left lower lobe. 3. Other imaging findings of potential clinical significance: Aortic Atherosclerosis (ICD10-I70.0). Coronary atherosclerosis. Systemic atherosclerosis. Multilevel lumbar impingement. Left adrenal adenoma. Right hemicolectomy.  TTE 05/14/2021: IMPRESSIONS    1. Left ventricular ejection fraction, by estimation, is 60 to 65%. The  left ventricle has normal function. The left ventricle has no regional  wall motion abnormalities. Left ventricular diastolic parameters are  consistent with Grade II diastolic  dysfunction (pseudonormalization). Elevated left atrial pressure.   2. Right ventricular systolic function is normal. The right ventricular  size is normal. There is normal pulmonary artery systolic pressure.   3. Left atrial size was mildly dilated.   4. There is no mass in the right atrium. Right atrial size was mildly  dilated.   5. The mitral valve is normal in structure. Mild mitral valve  regurgitation. No evidence of mitral stenosis.   6. The aortic valve is normal in structure. Aortic valve regurgitation is  not visualized. Mild aortic valve sclerosis is present, with no evidence  of aortic valve stenosis.   7. The inferior vena cava is normal in size with greater than 50%  respiratory variability, suggesting right atrial pressure of 3  mmHg.   Comparison(s): No significant change from prior study. Prior images  reviewed side by side.   Monitor 11/2020 Patient had a min HR of 61 bpm, max HR of 183 bpm, and avg HR of 93 bpm. Predominant underlying rhythm was Sinus Rhythm. Atrial Fibrillation occurred (18% burden), ranging from 67-183 bpm (avg of 113 bpm), the longest lasting 4 hours 4 mins with an avg rate of 111 bpm. Isolated PACs were frequent (12.3%, [id number]), PAC Couplets and triplets were frequent (5.0% and 4.9%). Isolated PVCs were rare (<1.0%), PVC Couplets were rare (<1.0%, 72).   Predominant underlying rhythm was Sinus Rhythm. Atrial Fibrillation occurred (18% burden), ranging from 67-183 bpm (avg of 113 bpm), the longest lasting 4 hours 4 mins with an avg rate of 111 bpm. Frequent PACs.   2D Echo 05/2018 - Left ventricle: The cavity size was normal. Wall thickness was    increased in a pattern of mild LVH. Systolic function was normal.    The estimated ejection fraction was in the range of 55% to 60%.    Wall motion was normal; there were no regional wall motion    abnormalities. Doppler parameters are consistent with abnormal    left ventricular relaxation (grade 1 diastolic dysfunction).  - Aortic valve: Mildly calcified annulus. Trileaflet; mildly    thickened leaflets. Valve area (VTI): 4.6 cm^2. Valve area    (Vmax): 4.22 cm^2.  - Right atrium: There is a 1.2 by 1.6 cm echodense mass in the    right atrium, overall poorly visualized but best seen in the    apical 4 chamber views. The subcostal views are limited . When    reviewing the 02/15/17 TEE this area is also poorly visualized, but    does appear to be due to a prominent eustachian valve, a benign    finding.  - Pulmonary arteries: Systolic pressure was mildly increased. PA    peak pressure: 37 mm Hg (S).  - Systemic veins: IVC is small suggesting low RA pressure and    hypovolemia.   Cardiac Cath 02/2017 Heavily calcified left main and proximal  to mid LAD. Eccentric 75% distal left main Mid LAD [place] 0, 1, 1 bifurcation stenosis with 90% LAD  and 75% second diagonal. Subtotal occlusion, 99%, of the moderate sized first diagonal, possibly the source of the patient's presentation. Ostial 80% circumflex. The distal circumflex is relatively small with 2 small to intermediate size branches distally. Mid to distal 70% PDA. The right coronary is dominant and otherwise widely patent. Mild hypokinesis mid anterior wall, likely due to occlusion of the first diagonal. Overall LV function is normal with EF 55%. Normal left ventricular filling pressures.   Recommendations:   Surgical consultation via TCTS for consideration of bypass surgery due to left main coronary disease and complex mid LAD stenoses.      EKG:  EKG is personally reviewed. 08/13/22: EKG was not ordered. 02/10/2022: Sinus rhythm. Rate 71 bpm. PACs. 04/22/2021: no EKG.   Recent Labs: 07/27/2022: ALT 9; BUN 21; Creatinine, Ser 1.09; Hemoglobin 15.2; Platelets 170; Potassium 4.3; Sodium 136   Recent Lipid Panel    Component Value Date/Time   CHOL 128 01/13/2021 1019   TRIG 150 (H) 01/13/2021 1019   HDL 48 01/13/2021 1019   CHOLHDL 2.7 01/13/2021 1019   CHOLHDL 3.4 02/12/2017 0657   VLDL 17 02/12/2017 0657   LDLCALC 54 01/13/2021 1019   LDLDIRECT 64 04/24/2019 0917   LDLDIRECT 133.2 10/14/2011 1106     Risk Assessment/Calculations:    CHA2DS2-VASc Score = 4  {This indicates a 4.8% annual risk of stroke. The patient's score is based upon: CHF History: 0 HTN History: 1 Diabetes History: 0 Stroke History: 0 Vascular Disease History: 1 Age Score: 2 Gender Score: 0         Physical Exam:    VS:  BP (!) 143/87 (BP Location: Right Arm, Patient Position: Sitting, Cuff Size: Normal)   Pulse 89   Ht 6' (1.829 m)   Wt 217 lb (98.4 kg)   SpO2 98%   BMI 29.43 kg/m     Wt Readings from Last 3 Encounters:  08/13/22 217 lb (98.4 kg)  06/03/22 215 lb 11.2 oz  (97.8 kg)  05/03/22 216 lb (98 kg)     GEN:  Elderly male, comfortable, NAD HEENT: Normal NECK: No JVD; No carotid bruits CARDIAC: RRR, 1/6 systolic murmur. No rubs, gallops RESPIRATORY:  Diminished but clear  ABDOMEN: Soft, non-tender, non-distended MUSCULOSKELETAL: Trace LE edema, warm SKIN: Warm and dry NEUROLOGIC:  Alert and oriented x 3 PSYCHIATRIC:  Normal affect   ASSESSMENT:    1. Coronary artery disease of native artery of native heart with stable angina pectoris ([place])   2. s/p NSTEMI (non-ST elevated myocardial infarction); with subsequent urgent CABG  5/18 ([place])   3. Mixed hyperlipidemia   4. Essential hypertension   5. Paroxysmal A-fib ([place])   6. S/P CABG x 3   7. Leg edema      PLAN:    In order of problems listed above:  #CAD s/p CABG in 2018: TTE 05/2021 with LVEF 60-65%, G2DD, normal RV, mild MR. Currently doing well without anginal symptoms. -Continue metop 25mg  XL daily -Continue pravastatin 40mg  daily -Nitro prn -Not on ASA due to need for apixaban  #Paroxymsal Afib: Doing well and asymptomatic. CHADs-vasc 4. Tolerating apixaban without bleeding issues.  -Continue apixaban 5mg  BID (has has been taking half dose BID and recommended he increase back to 5mg  BID) -Continue metop 25mg  XL daily -Follow-up with Dr. Lovena Le as scheudled  #HTN: Running mainly 130s at home. Will continue to monitor and adjust meds if >140s consistently. -Continue metop 25mg  XL daily -Continue HCTZ 25mg  daily  #HLD: LDL  at goal 54 in 01/2021. Declined repeat lipids today. -Continue pravastatin 40mg  daily  #LE edema: Chronic and overall stable. TTE 05/2021 with normal LVEF 60-65%.  -Continue HCTZ    Follow-up: 6 months.  Medication Adjustments/Labs and Tests Ordered: Current medicines are reviewed at length with the patient today.  Concerns regarding medicines are outlined above.   No orders of the defined types were placed in this encounter.  No orders of the  defined types were placed in this encounter.  Patient Instructions  Medication Instructions:  Your physician recommends that you continue on your current medications as directed. Please refer to the Current Medication list given to you today.  *If you need a refill on your cardiac medications before your next appointment, please call your pharmacy*  Lab Work: If you have labs (blood work) drawn today and your tests are completely normal, you will receive your results only by: [place] (if you have MyChart) OR A paper copy in the mail If you have any lab test that is abnormal or we need to change your treatment, we will call you to review the results.  Testing/Procedures: None ordered today .  Follow-Up: At [facility], you and your health needs are our priority.  As part of our continuing mission to provide you with exceptional heart care, we have created designated Provider Care Teams.  These Care Teams include your primary Cardiologist (physician) and Advanced Practice Providers (APPs -  Physician Assistants and Nurse Practitioners) who all work together to provide you with the care you need, when you need it.  We recommend signing up for the patient portal called "MyChart".  Sign up information is provided on this After Visit Summary.  MyChart is used to connect with patients for Virtual Visits (Telemedicine).  Patients are able to view lab/test results, encounter notes, upcoming appointments, etc.  Non-urgent messages can be sent to your provider as well.   To learn more about what you can do with MyChart, go to [email or web address].    Your next appointment:   6 month(s)  The format for your next appointment:   In Person  Provider:   Freada Bergeron, MD     Important Information About Sugar         I,Breanna Adamick,acting as a scribe for Freada Bergeron, MD.,have documented all relevant documentation on the behalf of Freada Bergeron, MD,as  directed by  Freada Bergeron, MD while in the presence of Freada Bergeron, MD.  I, Freada Bergeron, MD, have reviewed all documentation for this visit. The documentation on 08/13/22 for the exam, diagnosis, procedures, and orders are all accurate and complete.     Signed, Freada Bergeron, MD  08/13/2022 11:37 AM    [place]

## 2022-08-13 NOTE — Patient Instructions (Signed)
Medication Instructions:  Your physician recommends that you continue on your current medications as directed. Please refer to the Current Medication list given to you today.  *If you need a refill on your cardiac medications before your next appointment, please call your pharmacy*  Lab Work: If you have labs (blood work) drawn today and your tests are completely normal, you will receive your results only by: [place] (if you have MyChart) OR A paper copy in the mail If you have any lab test that is abnormal or we need to change your treatment, we will call you to review the results.  Testing/Procedures: None ordered today .  Follow-Up: At [facility], you and your health needs are our priority.  As part of our continuing mission to provide you with exceptional heart care, we have created designated Provider Care Teams.  These Care Teams include your primary Cardiologist (physician) and Advanced Practice Providers (APPs -  Physician Assistants and Nurse Practitioners) who all work together to provide you with the care you need, when you need it.  We recommend signing up for the patient portal called "MyChart".  Sign up information is provided on this After Visit Summary.  MyChart is used to connect with patients for Virtual Visits (Telemedicine).  Patients are able to view lab/test results, encounter notes, upcoming appointments, etc.  Non-urgent messages can be sent to your provider as well.   To learn more about what you can do with MyChart, go to [email or web address].    Your next appointment:   6 month(s)  The format for your next appointment:   In Person  Provider:   Freada Bergeron, MD     Important Information About Sugar

## 2022-08-18 ENCOUNTER — Encounter: Payer: Self-pay | Admitting: Internal Medicine

## 2022-08-18 ENCOUNTER — Ambulatory Visit: Payer: Medicare HMO | Admitting: Internal Medicine

## 2022-08-18 VITALS — BP 136/86 | HR 89 | Ht 72.0 in | Wt 214.0 lb

## 2022-08-18 DIAGNOSIS — K5909 Other constipation: Secondary | ICD-10-CM | POA: Diagnosis not present

## 2022-08-18 DIAGNOSIS — Z85038 Personal history of other malignant neoplasm of large intestine: Secondary | ICD-10-CM

## 2022-08-18 NOTE — Progress Notes (Signed)
Patient ID: Vincent Mcclure, male   DOB: 01-28-1932, 88 y.o.   MRN: [id number] HPI: Vincent Mcclure is an 88 year old male with a history of ascending colon cancer (stage IIIb) treated with hemicolectomy in 2019, history of sessile serrated colon polyps removed at colonoscopy in 2020, non-small cell lung cancer stage Ia, CAD and atrial fibrillation on Eliquis who is here to discuss colonoscopy.  He is here alone today.  He was last seen here in Aug 2020.  He follows with Dr. [name] for his colon cancer and lung cancer history.  He reports from a GI perspective he is feeling well.  He does struggle with constipation at times but is taking generic MiraLAX once daily.  With this he typically has a bowel movement every day.  No blood in stool or melena.  He denies nausea, vomiting.  No frequent heartburn or trouble swallowing.  No abdominal pain.  Good appetite with stable weight.  He does intermittently have chest pain and has had 2 such episodes in the last 2 weeks.  He has pain that starts in the center of his chest and radiates across his chest and down to both elbows.  This is typical angina for him and he uses nitroglycerin.  Past Medical History:  Diagnosis Date   Alcohol abuse    Anemia    Arthritis    hands   Blood transfusion without reported diagnosis    CAD (coronary artery disease)    a. NSTEMI 2018 s/p CABG.   CHF (congestive heart failure) ([name])    Chronic low back pain 10/14/2011   colon ca dx'd 05/18/18   colon cancer   Colon polyps    hyperplastic (2004, 2010) and adenomatous (1990).     COLONIC POLYPS, HX OF 03/05/2008   Esophageal stricture    hx of   Gastric ulcer    GERD 03/05/2008   GERD (gastroesophageal reflux disease) 1994   associated peptic strictures   HYPERLIPIDEMIA 03/05/2008   HYPERTENSION 03/05/2008   Hypertension    IBS 03/05/2008   Impaired glucose tolerance 10/12/2011   Iron deficiency anemia    LEG CRAMPS 09/02/2010   Lung cancer ([place]) dx'd 06/2020   Myocardial  infarction [facility])    May 2018   PAD (peripheral artery disease) ([place]) 2013   PAF (paroxysmal atrial fibrillation) ([place])    PERIPHERAL EDEMA 09/02/2010   PERIPHERAL NEUROPATHY 03/05/2008   Premature atrial contraction    Prostatitis    hx of   PVC's (premature ventricular contractions)    Sinus brady-tachy syndrome ([place])    VARICOSE VEINS, LOWER EXTREMITIES 06/09/2009    Past Surgical History:  Procedure Laterality Date   CATARACT EXTRACTION  bilat   COLECTOMY     COLONOSCOPY     CORONARY ARTERY BYPASS GRAFT N/A 02/15/2017   Procedure: CORONARY ARTERY BYPASS GRAFTING times three  with left internal mammary harvest and endoscopic harvest of Right SVG. Grafts of LIMA to  LAD, SVG to Distal Circ, and to First Diag.;  Surgeon: Grace Isaac, MD;  Location: [place];  Service: Open Heart Surgery;  Laterality: N/A;   ESOPHAGOGASTRODUODENOSCOPY N/A 11/14/2014   Procedure: ESOPHAGOGASTRODUODENOSCOPY (EGD);  Surgeon: Jerene Bears, MD;  Location: [facility] ENDOSCOPY;  Service: Endoscopy;  Laterality: N/A;   IR RADIOLOGIST EVAL & MGMT  07/04/2018   IR RADIOLOGIST EVAL & MGMT  07/18/2018   LAPAROTOMY N/A 05/19/2018   Procedure: EXPLORATORY LAPAROTOMY RIGHT COLECTOMY;  Surgeon: Fanny Skates, MD;  Location: [facility];  Service:  General;  Laterality: N/A;   LEFT HEART CATH AND CORONARY ANGIOGRAPHY N/A 02/14/2017   Procedure: Left Heart Cath and Coronary Angiography;  Surgeon: Belva Crome, MD;  Location: [facility];  Service: Cardiovascular;  Laterality: N/A;   LUMBAR SPINE SURGERY  11/2008   Dr Joya Salm   TEE WITHOUT CARDIOVERSION N/A 02/15/2017   Procedure: TRANSESOPHAGEAL ECHOCARDIOGRAM (TEE);  Surgeon: Grace Isaac, MD;  Location: [place];  Service: Open Heart Surgery;  Laterality: N/A;   UPPER GASTROINTESTINAL ENDOSCOPY      Outpatient Medications Prior to Visit  Medication Sig Dispense Refill   acetaminophen (TYLENOL) 500 MG tablet Take 1 tablet (500 mg total) by mouth every 6 (six) hours as  needed for mild pain.  0   apixaban (ELIQUIS) 5 MG TABS tablet Take 1 tablet (5 mg total) by mouth 2 (two) times daily. 60 tablet 5   bacitracin 500 UNIT/GM ointment Apply 1 application topically daily as needed for wound care.     Cholecalciferol (VITAMIN D-3 PO) Take 2 capsules by mouth daily.      hydrochlorothiazide (HYDRODIURIL) 25 MG tablet Take 1 tablet (25 mg total) by mouth daily. 90 tablet 2   metoprolol succinate (TOPROL XL) 25 MG 24 hr tablet Take 1 tablet (25 mg total) by mouth daily. 90 tablet 3   nitroGLYCERIN (NITROSTAT) 0.4 MG SL tablet Place 1 tablet (0.4 mg total) under the tongue every 5 (five) minutes as needed for chest pain (or tightness). 25 tablet 3   pravastatin (PRAVACHOL) 40 MG tablet Take 1 tablet (40 mg total) by mouth at bedtime. 90 tablet 2   silver sulfADIAZINE (SILVADENE) 1 % cream Apply 1 Application topically daily. 50 g 0   No facility-administered medications prior to visit.    No Known Allergies  Family History  Problem Relation Age of Onset   Cancer Mother        Brain Cancer   Diabetes Father    Heart disease Father        CAD   Hypertension Father    Stroke Father    Diabetes Sister    Stomach cancer Neg Hx    Pancreatic cancer Neg Hx    Colon cancer Neg Hx    Esophageal cancer Neg Hx    Rectal cancer Neg Hx     Social History   Tobacco Use   Smoking status: Former    Packs/day: 1.25    Years: 50.00    Total pack years: 62.50    Types: Cigarettes    Quit date: 11/11/2008    Years since quitting: 13.7   Smokeless tobacco: Former    Types: Chew   Tobacco comments:    smoked off and on for years  Vaping Use   Vaping Use: Never used  Substance Use Topics   Alcohol use: Yes    Alcohol/week: 6.0 standard drinks of alcohol    Types: 2 Glasses of wine, 4 Cans of beer per week    Comment: vodka   Drug use: No    ROS: As per history of present illness, otherwise negative  BP 136/86   Pulse 89   Ht 6' (1.829 m)   Wt 214 lb  (97.1 kg)   BMI 29.02 kg/m  Gen: awake, alert, NAD HEENT: anicteric Abd: soft, obese, NT/ND, +BS throughout Ext: no c/c/e Neuro: nonfocal   RELEVANT LABS AND IMAGING: CBC    Component Value Date/Time   WBC 5.3 07/27/2022 1139   RBC  5.18 07/27/2022 1139   HGB 15.2 07/27/2022 1139   HGB 14.4 09/28/2021 1202   HGB 14.9 01/13/2021 1019   HCT 45.1 07/27/2022 1139   HCT 43.7 01/13/2021 1019   PLT 170 07/27/2022 1139   PLT 214 09/28/2021 1202   PLT 167 01/13/2021 1019   MCV 87.1 07/27/2022 1139   MCV 89 01/13/2021 1019   MCH 29.3 07/27/2022 1139   MCHC 33.7 07/27/2022 1139   RDW 13.6 07/27/2022 1139   RDW 12.2 01/13/2021 1019   LYMPHSABS 1.0 07/27/2022 1139   LYMPHSABS 1.3 04/24/2019 0917   MONOABS 0.6 07/27/2022 1139   EOSABS 0.0 07/27/2022 1139   EOSABS 0.1 04/24/2019 0917   BASOSABS 0.0 07/27/2022 1139   BASOSABS 0.0 04/24/2019 0917    CMP     Component Value Date/Time   NA 136 07/27/2022 1139   NA 138 01/13/2021 1019   K 4.3 07/27/2022 1139   CL 97 (L) 07/27/2022 1139   CO2 34 (H) 07/27/2022 1139   GLUCOSE 111 (H) 07/27/2022 1139   BUN 21 07/27/2022 1139   BUN 17 01/13/2021 1019   CREATININE 1.09 07/27/2022 1139   CREATININE 1.03 09/28/2021 1202   CALCIUM 9.5 07/27/2022 1139   PROT 6.9 07/27/2022 1139   PROT 6.3 04/24/2019 0917   ALBUMIN 4.2 07/27/2022 1139   ALBUMIN 4.3 04/24/2019 0917   AST 15 07/27/2022 1139   AST 14 (L) 09/28/2021 1202   ALT 9 07/27/2022 1139   ALT 10 09/28/2021 1202   ALKPHOS 89 07/27/2022 1139   BILITOT 0.7 07/27/2022 1139   BILITOT 0.7 09/28/2021 1202   GFRNONAA >60 07/27/2022 1139   GFRNONAA >60 09/28/2021 1202   GFRAA >60 05/28/2020 0855   CT CHEST, ABDOMEN, AND PELVIS WITH CONTRAST   TECHNIQUE: Multidetector CT imaging of the chest, abdomen and pelvis was performed following the standard protocol during bolus administration of intravenous contrast.   CONTRAST:  87mL OMNIPAQUE IOHEXOL 350 MG/ML SOLN   COMPARISON:   Multiple exams, including 02/02/2021   FINDINGS: CT CHEST FINDINGS   Cardiovascular: Coronary, aortic arch, and branch vessel atherosclerotic vascular disease. Prior CABG.   Mediastinum/Nodes: Unremarkable   Lungs/Pleura: Densely calcified granuloma the apical segment right upper lobe on image 38 series 6. Mild scarring anteriorly in the right upper lobe on image 40 series 6, stable.   Right lower lobe infrahilar density with mildly increased volume loss and consolidation for example on image 76 of series 6 in the superior segment right lower lobe. Much of the appearance is likely related to radiation therapy. The associated bandlike densities obscure prior nodularity in the region.   Stable mild left apical pleuroparenchymal scarring. There is some mild tree-in-bud nodularity centrally in the lingula on image 126 series 6 likely from atypical infectious bronchiolitis. In addition there is some volume loss and bandlike densities in the left lower lobe primarily favoring atelectasis.   Musculoskeletal: Thoracic spondylosis with multilevel degenerative endplate findings in the thoracic spine.   CT ABDOMEN PELVIS FINDINGS   Hepatobiliary: Unremarkable   Pancreas: Unremarkable   Spleen: Punctate calcifications compatible with old granulomatous disease.   Adrenals/Urinary Tract: 2.1 by 2.2 cm left adrenal mass compatible with known adenoma. Mild stable nodularity of the right adrenal gland without overt mass. Small bilateral hypodense renal lesions are technically too small to characterize although statistically likely to be benign.   Stomach/Bowel: Right hemicolectomy. Scattered sigmoid colon diverticula.   Vascular/Lymphatic: Substantial atherosclerosis is present, including aortoiliac atherosclerotic disease.   Reproductive:  Unremarkable   Other: No supplemental non-categorized findings.   Musculoskeletal: Lumbar spondylosis and degenerative disc disease causing  multilevel impingement. Postoperative findings in the lumbar spine noted. Grade 1 degenerative retrolisthesis at L1-2 and L2-3 with grade 1 degenerative anterolisthesis at L4-5. Fatty spermatic cords, left greater than right.   IMPRESSION: 1. Further volume loss/consolidation medially in the superior segment left lower lobe probably related to prior radiation therapy. Underlying original lung nodule not readily distinguishable. 2. Suspected mild atypical infectious bronchiolitis in the lingula. Mild atelectasis in the left lower lobe. 3. Other imaging findings of potential clinical significance: Aortic Atherosclerosis (ICD10-I70.0). Coronary atherosclerosis. Systemic atherosclerosis. Multilevel lumbar impingement. Left adrenal adenoma. Right hemicolectomy.     Electronically Signed   By: Van Clines M.D.   On: 09/29/2021 09:37  ASSESSMENT/PLAN: 88 year old male with a history of ascending colon cancer (stage IIIb) treated with hemicolectomy in 2019, history of sessile serrated colon polyps removed at colonoscopy in 2020, non-small cell lung cancer stage Ia, CAD and atrial fibrillation on Eliquis who is here to discuss colonoscopy.  Personal history of a sending colon cancer (stage IIIb) treated with resection in 2019/history of colon polyps last colonoscopy 2020 --by guidelines he is due for surveillance colonoscopy at this time.  However based on his age as well as underlying coronary artery disease with intermittent angina colonoscopy is not clearly beneficial for him.  We had a thorough discussion and he is in agreement.  His cross-sectional imaging has been unremarkable and the polyps removed in 2020 were not advanced.  I am happy to reconsider this if troublesome GI symptoms present themselves or if oncology feels otherwise -- Deferred surveillance colonoscopy at this time  2.  Chronic constipation --currently relieved by MiraLAX 17 g daily.  Continue current dose  3.   Intermittent angina/CAD/atrial fibrillation on Eliquis --he follows closely with cardiology.      [id number], [facility] [address] [place],  [place] [id number]

## 2022-08-18 NOTE — Patient Instructions (Addendum)
   If you are age 86 or older, your body mass index should be between 23-30. Your Body mass index is 29.02 kg/m. If this is out of the aforementioned range listed, please consider follow up with your Primary Care Provider.  If you are age 74 or younger, your body mass index should be between 19-25. Your Body mass index is 29.02 kg/m. If this is out of the aformentioned range listed, please consider follow up with your Primary Care Provider.   ________________________________________________________  The [name] providers would like to encourage you to use [facility] to communicate with providers for non-urgent requests or questions.  Due to long hold times on the telephone, sending your provider a message by [facility] may be a faster and more efficient way to get a response.  Please allow 48 business hours for a response.  Please remember that this is for non-urgent requests.    Follow up if symptoms worsen or fail to improve  Please purchase the following medications over the counter and take as directed: miralax daily  Thank you for entrusting me with your care and choosing [facility].  Dr Hilarie Fredrickson

## 2022-09-24 DIAGNOSIS — L0202 Furuncle of face: Secondary | ICD-10-CM | POA: Diagnosis not present

## 2022-09-24 DIAGNOSIS — B9689 Other specified bacterial agents as the cause of diseases classified elsewhere: Secondary | ICD-10-CM | POA: Diagnosis not present

## 2022-10-08 DIAGNOSIS — L738 Other specified follicular disorders: Secondary | ICD-10-CM | POA: Diagnosis not present

## 2022-10-08 DIAGNOSIS — L720 Epidermal cyst: Secondary | ICD-10-CM | POA: Diagnosis not present

## 2022-11-02 ENCOUNTER — Telehealth: Payer: Self-pay | Admitting: Hematology

## 2022-11-02 NOTE — Telephone Encounter (Signed)
R/s per 1/23 IB, msg left

## 2022-11-03 ENCOUNTER — Other Ambulatory Visit: Payer: Medicare HMO

## 2022-11-03 ENCOUNTER — Ambulatory Visit: Payer: Medicare HMO | Admitting: Hematology

## 2022-11-11 NOTE — Assessment & Plan Note (Signed)
squamous cell carcinoma, cT1bN0M0 stage IA -1.7 cm RLL pulmonary nodule seen on 05/30/20 surveillance CT, and hypermetabolic on 05/19/37 PET. No other distant metastasis. -biopsy on 07/02/20 showed squamous cell carcinoma. -s/p SBRT 07/30/20 - 08/08/20 with Dr Lisbeth Renshaw. He is now on surveillance.  -surveillance CT chest 07/27/22 showed NED. -Plan to repeat CT chest in 3 months

## 2022-11-11 NOTE — Assessment & Plan Note (Signed)
stage IIIB (pT4a, pN1, cM0), MSS -Diagnosed in 05/2018. Treated with hemicolectomy and adjuvant Xeloda 07/31/18 - 01/29/19.  -Latest CT CAP from 09/28/21 showed only post-Covid and post-radiation changes.  -It has been 4.5 years since his initial diagnosis, his risk of recurrence is minimal now.  He is clinically doing well, no concern for recurrence.

## 2022-11-11 NOTE — Progress Notes (Signed)
[place]   [phone] [phone]   Clinic Follow up Note   Patient Care Team: Program, [facility] as PCP - General Lovena Le, Champ Mungo, MD as PCP - Electrophysiology (Cardiology) Freada Bergeron, MD as PCP - Cardiology (Cardiology) Pyrtle, Lajuan Lines, MD as Consulting Physician (Gastroenterology) Allyn Kenner, MD as Consulting Physician (Dermatology) Dorothy Spark, MD as Consulting Physician (Cardiology) Levin Erp, [place] as Physician Assistant (Gastroenterology) Murrell Converse as Physician Assistant (Cardiology)  Date of Service:  11/12/2022  CHIEF COMPLAINT: f/u of colon and lung cancers   CURRENT THERAPY:  Surveillance    ASSESSMENT:  Vincent Mcclure is a 89 y.o. male with   Cancer of ascending colon pT4apN1 s/p right colectomy 05/19/2018 stage IIIB (pT4a, pN1, cM0), MSS -Diagnosed in 05/2018. Treated with hemicolectomy and adjuvant Xeloda 07/31/18 - 01/29/19.  -Latest CT CAP from 09/28/21 showed only post-Covid and post-radiation changes.  -It has been 4.5 years since his initial diagnosis, his risk of recurrence is minimal now.  He is clinically doing well, no concern for recurrence.  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 No other distant metastasis. -biopsy on 07/02/20 showed squamous cell carcinoma. -s/p SBRT 07/30/20 - 08/08/20 with Dr Lisbeth Renshaw. He is now on surveillance.  -surveillance CT chest 07/27/22 showed NED. -Plan to repeat CT chest in 6 months    PLAN: -lab reviewed - Discuss scan from 3 months ago. -CT scan in 6 months -f/u in 6 months w/lab CT CHEST wo contrast    SUMMARY OF ONCOLOGIC HISTORY: Oncology History Overview Note  Cancer Staging Cancer of ascending colon pT4apN1 s/p right colectomy 05/19/2018 Staging form:  Colon and Rectum, AJCC 8th Edition - Pathologic stage from 05/19/2018: Stage IIIB (pT4a, pN1, cM0) - Signed by Truitt Merle, MD on 05/24/2018     Cancer of ascending colon pT4apN1 s/p right colectomy 05/19/2018  05/18/2018 Imaging   05/18/2018 CT AP IMPRESSION: 1. There appears to be a nearly obstructing lesion in the region of the hepatic flexure of the colon which is highly concerning for primary colonic neoplasm. Slight haziness of the surrounding soft tissues may be indicative of early local invasion. This appears partially obstructive as evidenced by dilatation of more proximal aspects of the small bowel and colon. 2. Left adrenal lesion is stable compared to prior studies, previously characterized as an adenoma. 3. Aortic atherosclerosis, in addition to left main and 3 vessel coronary artery disease. Status post median sternotomy for CABG. 4. Additional incidental findings, as above.   Aortic Atherosclerosis (ICD10-I70.0).   05/18/2018 - 05/30/2018 Hospital Admission   Admit date: 05/18/2018 Admission diagnosis: Colon Cancer Discharged on: 05/30/2018   05/19/2018 Cancer Staging   Staging form: Colon and Rectum, AJCC 8th Edition - Pathologic stage from 05/19/2018: Stage IIIB (pT4a, pN1, cM0) - Signed by Truitt Merle, MD on 05/24/2018    05/19/2018 Pathology Results   05/19/2018 Surgical Pathology Diagnosis Colon, segmental resection for tumor, right - INVASIVE COLORECTAL ADENOCARCINOMA, 5 CM. - CARCINOMA FOCALLY LESS THAN 0.1 CM FROM SEROSAL SURFACE. - MARGINS NOT INVOLVED. - METASTATIC CARCINOMA IN ONE OF EIGHT LYMPH NODES (1/9). - BENIGN APPENDIX.   05/19/2018 Surgery   EXPLORATORY LAPAROTOMY with right colectomy with Fanny Skates, MD   05/24/2018 Initial Diagnosis   Cancer of right colon ([place])   06/12/2018 Imaging  06/12/2018 CT CAP IMPRESSION: 1. Evidence all bowel perforation/anastomosed breakdown with large volume intraperitoneal free air fluid in the RIGHT upper quadrant adjacent  and above the liver. 2. Large loculated fluid collection in the RIGHT anterior pelvis extending along the LEFT pericolic gutter consists with PERITONEAL ABSCESS. 3. Of note, no large volume intraperitoneal free fluid. Oral contrast passed through the RIGHT colon anastomosis without evidence of leak. Findings could indicate a prior anastomotic breakdown which has healed in the interval with subsequent abscess formation from the prior leak / breakdown.   06/17/2018 Procedure   06/17/2018 US Thoracentesis IMPRESSION: Successful ultrasound guided right thoracentesis yielding 1 L of pleural fluid. No pneumothorax on post-procedure chest x-ray.  No malignancy on pathology   06/20/2018 Imaging   06/20/2018 CT AP IMPRESSION: 1. Decompression of right upper quadrant air and fluid collection after percutaneous catheter drainage. There remains a tiny rim of fluid as well as small extraluminal gas bubbles in this region suspicious for continued leak from bowel. 2. Further decrease in size of residual abscess fluid collection in the pelvis after percutaneous catheter drainage. There remains some fluid around the drainage catheter in the right pelvis.   07/04/2018 Imaging   07/04/2018 CT AP IMPRESSION: 1. Further decompression of right perihepatic abscess with resolution of extraluminal air. 2. Relatively stable volume of fluid surrounding the right pelvic drainage catheter.   07/05/2018 Imaging   07/05/2018 CT AP IMPRESSION: 1. No change compared to the CT scan performed 9 hours earlier. 2. Anterior right perihepatic space collection is decompressed by indwelling percutaneous drain, with no residual measurable collection in this location. 3. Thick walled right lower quadrant collection with indwelling percutaneous drain is stable. No new focal fluid collections. 4. Stable postsurgical changes from right hemicolectomy with no evidence of bowel obstruction. Stable reactive wall thickening  in the sigmoid colon adjacent to the right lower quadrant collection. 5. Stable small to moderate dependent right pleural effusion. 6. Stable left adrenal adenoma. 7.  Aortic Atherosclerosis (ICD10-I70.0).   07/31/2018 - 01/21/2019 Chemotherapy   Adjuvant Xeloda 2000mg  bid, 7 days on, 7 days off.  Reduced to 2000mg  in the AM and 1500mg  in the PM due to diarrhea. Completed on 01/21/19    04/12/2019 Imaging   CT CAP W contrast 04/12/19  IMPRESSION: 1. Status post right hemicolectomy. 2. No definite metastatic disease in the chest, abdomen, or pelvis. 7 mm right lower lobe pulmonary nodule, obscured by atelectasis on previous chest CT and not included on multiple prior abdomen / pelvis CTs. Close follow-up recommended to ensure stability. 3.  Aortic Atherosclerois (ICD10-170.0)   09/04/2019 Imaging   CT AP W Contrast I MPRESSION: 1. No evidence of local colon cancer recurrence at the RIGHT hemicolectomy site. 2. No evidence of metastatic disease in the abdomen pelvis. 3. Mild nodularity at the LEFT lung base is favored benign. Recommend attention on routine surveillance 4. Aortic Atherosclerosis (ICD10-I70.0).   07/02/2020 Relapse/Recurrence   FINAL MICROSCOPIC DIAGNOSIS:   A. LUNG, RIGHT LOWER LOBE, NEEDLE CORE BIOPSY:  - Non-small cell carcinoma, see comment    COMMENT:   Immunohistochemical stains for characterization of the tumor phenotype  are pending and will be reported in an addendum.  Dr. Tresa Moore reviewed the  case and concurs with the diagnosis. Dr. [name] was notified on 07/03/2020.    09/28/2021 Imaging   EXAM: CT CHEST, ABDOMEN, AND PELVIS WITH CONTRAST  IMPRESSION: 1. Further volume loss/consolidation medially in the superior segment left lower lobe probably related to prior radiation  therapy. Underlying original lung nodule not readily distinguishable. 2. Suspected mild atypical infectious bronchiolitis in the lingula. Mild atelectasis in the left lower lobe. 3.  Other imaging findings of potential clinical significance: Aortic Atherosclerosis (ICD10-I70.0). Coronary atherosclerosis. Systemic atherosclerosis. Multilevel lumbar impingement. Left adrenal adenoma. Right hemicolectomy.   Malignant neoplasm of bronchus of right lower lobe ([place])  05/30/2020 Imaging   CT CAP w contrast IMPRESSION: 1. Significant enlargement of a right lower lobe pulmonary nodule. Suspicious for primary bronchogenic carcinoma. Isolated pulmonary metastasis felt less likely. 2. Otherwise, no evidence of metastatic disease from patient's colon cancer. 3. Aortic atherosclerosis (ICD10-I70.0) and emphysema (ICD10-J43.9). 4. Left adrenal adenoma.   These results will be called to the ordering clinician or representative by the Radiologist Assistant, and communication documented in the PACS or [employer].   06/11/2020 PET scan   IMPRESSION: 1. The index nodule within the superior segment of the right lower lobe is FDG avid and worrisome for either primary bronchogenic neoplasm versus metastatic disease 2. Within the anteromedial aspect of the left maxillary sinus, in the region of the nasolacrimal duct, there is a focal soft tissue attenuating filling defect which exhibits intense FDG uptake within SUV max of 55.8. The although focal uptake within the paranasal sinuses may be seen with sinusitis primary neoplasm of the sinuses or nasal lacrimal duct cannot be excluded. In light of the intense FDG uptake in this area further investigation with contrast enhanced CT or MRI of the soft tissues of neck to include the maxillary sinuses is recommended. 3.  Aortic Atherosclerosis (ICD10-I70.0). 4. Coronary artery calcifications 5. Prior granulomatous disease.     07/02/2020 Cancer Staging   Staging form: Lung, AJCC 8th Edition - Clinical stage from 07/02/2020: Stage IA2 (cT1b, cN0, cM0) - Signed by Truitt Merle, MD on 07/06/2020   07/06/2020 Initial Diagnosis   Non-small  cell lung cancer, right ([place])   07/30/2020 - 08/08/2020 Radiation Therapy   SBRT to lung 07/16/20-08/04/20 with Dr Lisbeth Renshaw.    11/07/2020 Imaging   CT Chest  IMPRESSION: 1. Decreased size of dominant area of nodularity in the RIGHT chest with surrounding post treatment changes. 2. New area of nodularity along the pleural surface along the posterosuperior segment of the RIGHT lower lobe is suspicious but indeterminate at this time. Could consider short interval follow-up or PET imaging for further assessment as this area would be challenging for tissue sampling. There is also the possibility this area could somehow related to post treatment changes as well, though the morphologic features do raise suspicion. 3. Stable LEFT adrenal adenoma. 4. Changes of CABG. 5. Aortic atherosclerosis.   Aortic Atherosclerosis (ICD10-I70.0).   09/28/2021 Imaging   EXAM: CT CHEST, ABDOMEN, AND PELVIS WITH CONTRAST  IMPRESSION: 1. Further volume loss/consolidation medially in the superior segment left lower lobe probably related to prior radiation therapy. Underlying original lung nodule not readily distinguishable. 2. Suspected mild atypical infectious bronchiolitis in the lingula. Mild atelectasis in the left lower lobe. 3. Other imaging findings of potential clinical significance: Aortic Atherosclerosis (ICD10-I70.0). Coronary atherosclerosis. Systemic atherosclerosis. Multilevel lumbar impingement. Left adrenal adenoma. Right hemicolectomy.      INTERVAL HISTORY:  Vincent Mcclure is here for a follow up of  colon and lung cancers He was last seen by me on 08/03/2023  He presents to the clinic alone. Pt states that his hearing is going.Pt reports that he had a bad cold a couple weeks ago. He states he would have congestion but didn't feel bad.Pt overall  he feels better.Pt appetite is good. Pt states he drinks beer and Vodka. Pt denies having stomach issues. Pt BM seems to be small in diameter   and he went to GI doctor.    All other systems were reviewed with the patient and are negative.  MEDICAL HISTORY:  Past Medical History:  Diagnosis Date   Alcohol abuse    Anemia    Arthritis    hands   Blood transfusion without reported diagnosis    CAD (coronary artery disease)    a. NSTEMI 2018 s/p CABG.   CHF (congestive heart failure) ([name])    Chronic low back pain 10/14/2011   colon ca dx'd 05/18/18   colon cancer   Colon polyps    hyperplastic (2004, 2010) and adenomatous (1990).     COLONIC POLYPS, HX OF 03/05/2008   Esophageal stricture    hx of   Gastric ulcer    GERD 03/05/2008   GERD (gastroesophageal reflux disease) 1994   associated peptic strictures   HYPERLIPIDEMIA 03/05/2008   HYPERTENSION 03/05/2008   Hypertension    IBS 03/05/2008   Impaired glucose tolerance 10/12/2011   Iron deficiency anemia    LEG CRAMPS 09/02/2010   Lung cancer ([place]) dx'd 06/2020   Myocardial infarction [facility])    May 2018   PAD (peripheral artery disease) ([place]) 2013   PAF (paroxysmal atrial fibrillation) ([place])    PERIPHERAL EDEMA 09/02/2010   PERIPHERAL NEUROPATHY 03/05/2008   Premature atrial contraction    Prostatitis    hx of   PVC's (premature ventricular contractions)    Sinus brady-tachy syndrome ([place])    VARICOSE VEINS, LOWER EXTREMITIES 06/09/2009    SURGICAL HISTORY: Past Surgical History:  Procedure Laterality Date   CATARACT EXTRACTION  bilat   COLECTOMY     COLONOSCOPY     CORONARY ARTERY BYPASS GRAFT N/A 02/15/2017   Procedure: CORONARY ARTERY BYPASS GRAFTING times three  with left internal mammary harvest and endoscopic harvest of Right SVG. Grafts of LIMA to  LAD, SVG to Distal Circ, and to First Diag.;  Surgeon: Grace Isaac, MD;  Location: [place];  Service: Open Heart Surgery;  Laterality: N/A;   ESOPHAGOGASTRODUODENOSCOPY N/A 11/14/2014   Procedure: ESOPHAGOGASTRODUODENOSCOPY (EGD);  Surgeon: Jerene Bears, MD;  [facility];  Service: Endoscopy;   Laterality: N/A;   IR RADIOLOGIST EVAL & MGMT  07/04/2018   IR RADIOLOGIST EVAL & MGMT  07/18/2018   LAPAROTOMY N/A 05/19/2018   Procedure: EXPLORATORY LAPAROTOMY RIGHT COLECTOMY;  Surgeon: Fanny Skates, MD;  Location: [facility];  Service: General;  Laterality: N/A;   LEFT HEART CATH AND CORONARY ANGIOGRAPHY N/A 02/14/2017   Procedure: Left Heart Cath and Coronary Angiography;  Surgeon: Belva Crome, MD;  Location: [place] [facility];  Service: Cardiovascular;  Laterality: N/A;   LUMBAR SPINE SURGERY  11/2008   Dr Joya Salm   TEE WITHOUT CARDIOVERSION N/A 02/15/2017   Procedure: TRANSESOPHAGEAL ECHOCARDIOGRAM (TEE);  Surgeon: Grace Isaac, MD;  Location: [place];  Service: Open Heart Surgery;  Laterality: N/A;   UPPER GASTROINTESTINAL ENDOSCOPY      I have reviewed the social history and family history with the patient and they are unchanged from previous note.  ALLERGIES:  has No Known Allergies.  MEDICATIONS:  Current Outpatient Medications  Medication Sig Dispense Refill   acetaminophen (TYLENOL) 500 MG tablet Take 1 tablet (500 mg total) by mouth every 6 (six) hours as needed for mild pain.  0   apixaban (  ELIQUIS) 5 MG TABS tablet Take 1 tablet (5 mg total) by mouth 2 (two) times daily. 60 tablet 5   bacitracin 500 UNIT/GM ointment Apply 1 application topically daily as needed for wound care.     Cholecalciferol (VITAMIN D-3 PO) Take 2 capsules by mouth daily.      hydrochlorothiazide (HYDRODIURIL) 25 MG tablet Take 1 tablet (25 mg total) by mouth daily. 90 tablet 2   metoprolol succinate (TOPROL XL) 25 MG 24 hr tablet Take 1 tablet (25 mg total) by mouth daily. 90 tablet 3   nitroGLYCERIN (NITROSTAT) 0.4 MG SL tablet Place 1 tablet (0.4 mg total) under the tongue every 5 (five) minutes as needed for chest pain (or tightness). 25 tablet 3   pravastatin (PRAVACHOL) 40 MG tablet Take 1 tablet (40 mg total) by mouth at bedtime. 90 tablet 2   silver sulfADIAZINE (SILVADENE) 1 % cream Apply 1  Application topically daily. 50 g 0   No current facility-administered medications for this visit.    PHYSICAL EXAMINATION: ECOG PERFORMANCE STATUS: 1 - Symptomatic but completely ambulatory  Vitals:   11/12/22 1017  BP: (!) 150/87  Pulse: 85  Resp: 19  Temp: 98.7 F (37.1 C)  SpO2: 97%   Wt Readings from Last 3 Encounters:  11/12/22 216 lb 1.6 oz (98 kg)  08/18/22 214 lb (97.1 kg)  08/13/22 217 lb (98.4 kg)      GENERAL:alert, no distress and comfortable SKIN: skin color normal, no rashes or significant lesions EYES: normal, Conjunctiva are pink and non-injected, sclera clear  NEURO: alert & oriented x 3 with fluent speech LUNGS: (-) clear to auscultation and percussion with normal breathing effort HEART: (-) regular rate & rhythm and no murmurs and no (-) lower extremity edema ABDOMEN:(-) abdomen soft, non-tender and normal bowel sounds   LABORATORY DATA:  I have reviewed the data as listed    Latest Ref Rng & Units 11/12/2022    9:18 AM 07/27/2022   11:39 AM 06/03/2022   10:26 AM  CBC  WBC 4.0 - 10.5 K/uL 6.8  5.3  5.0   Hemoglobin 13.0 - 17.0 g/dL 15.5  15.2  14.8   Hematocrit 39.0 - 52.0 % 45.2  45.1  44.1   Platelets 150 - 400 K/uL 184  170  156         Latest Ref Rng & Units 11/12/2022    9:18 AM 07/27/2022   11:39 AM 06/03/2022   10:26 AM  CMP  Glucose 70 - 99 mg/dL 72  111  99   BUN 8 - 23 mg/dL 23  21  17    Creatinine 0.61 - 1.24 mg/dL 1.09  1.09  0.92   Sodium 135 - 145 mmol/L 139  136  136   Potassium 3.5 - 5.1 mmol/L 4.7  4.3  3.8   Chloride 98 - 111 mmol/L 100  97  99   CO2 22 - 32 mmol/L 34  34  31   Calcium 8.9 - 10.3 mg/dL 9.9  9.5  9.8   Total Protein 6.5 - 8.1 g/dL 6.6  6.9  6.7   Total Bilirubin 0.3 - 1.2 mg/dL 0.7  0.7  0.8   Alkaline Phos 38 - 126 U/L 111  89  94   AST 15 - 41 U/L 14  15  14    ALT 0 - 44 U/L 8  9  8        RADIOGRAPHIC STUDIES: I have personally reviewed the radiological images  as listed and agreed with the  findings in the report. No results found.    Orders Placed This Encounter  Procedures   CT CHEST WO CONTRAST    Standing Status:   Future    Standing Expiration Date:   11/13/2023    Order Specific Question:   Preferred imaging location?    Answer:   [facility]    Order Specific Question:   Release to patient    Answer:   Immediate   All questions were answered. The patient knows to call the clinic with any problems, questions or concerns. No barriers to learning was detected. The total time spent in the appointment was 25 minutes.     Truitt Merle, MD 11/12/2022   Felicity Coyer, CMA, am acting as scribe for Truitt Merle, MD.   I have reviewed the above documentation for accuracy and completeness, and I agree with the above.

## 2022-11-12 ENCOUNTER — Encounter: Payer: Self-pay | Admitting: Hematology

## 2022-11-12 ENCOUNTER — Inpatient Hospital Stay (HOSPITAL_BASED_OUTPATIENT_CLINIC_OR_DEPARTMENT_OTHER): Payer: Medicare HMO | Admitting: Hematology

## 2022-11-12 ENCOUNTER — Inpatient Hospital Stay: Payer: Medicare HMO | Attending: Hematology

## 2022-11-12 VITALS — BP 150/87 | HR 85 | Temp 98.7°F | Resp 19 | Ht 72.0 in | Wt 216.1 lb

## 2022-11-12 DIAGNOSIS — Z85038 Personal history of other malignant neoplasm of large intestine: Secondary | ICD-10-CM | POA: Diagnosis not present

## 2022-11-12 DIAGNOSIS — C182 Malignant neoplasm of ascending colon: Secondary | ICD-10-CM

## 2022-11-12 DIAGNOSIS — Z85118 Personal history of other malignant neoplasm of bronchus and lung: Secondary | ICD-10-CM | POA: Diagnosis not present

## 2022-11-12 DIAGNOSIS — Z08 Encounter for follow-up examination after completed treatment for malignant neoplasm: Secondary | ICD-10-CM | POA: Diagnosis not present

## 2022-11-12 DIAGNOSIS — C3431 Malignant neoplasm of lower lobe, right bronchus or lung: Secondary | ICD-10-CM

## 2022-11-12 DIAGNOSIS — C3491 Malignant neoplasm of unspecified part of right bronchus or lung: Secondary | ICD-10-CM

## 2022-11-12 DIAGNOSIS — I509 Heart failure, unspecified: Secondary | ICD-10-CM | POA: Diagnosis not present

## 2022-11-12 LAB — CBC WITH DIFFERENTIAL/PLATELET
Abs Immature Granulocytes: 0.01 10*3/uL (ref 0.00–0.07)
Basophils Absolute: 0 10*3/uL (ref 0.0–0.1)
Basophils Relative: 0 %
Eosinophils Absolute: 0.1 10*3/uL (ref 0.0–0.5)
Eosinophils Relative: 1 %
HCT: 45.2 % (ref 39.0–52.0)
Hemoglobin: 15.5 g/dL (ref 13.0–17.0)
Immature Granulocytes: 0 %
Lymphocytes Relative: 22 %
Lymphs Abs: 1.5 10*3/uL (ref 0.7–4.0)
MCH: 30.3 pg (ref 26.0–34.0)
MCHC: 34.3 g/dL (ref 30.0–36.0)
MCV: 88.3 fL (ref 80.0–100.0)
Monocytes Absolute: 0.9 10*3/uL (ref 0.1–1.0)
Monocytes Relative: 13 %
Neutro Abs: 4.3 10*3/uL (ref 1.7–7.7)
Neutrophils Relative %: 64 %
Platelets: 184 10*3/uL (ref 150–400)
RBC: 5.12 MIL/uL (ref 4.22–5.81)
RDW: 13.6 % (ref 11.5–15.5)
WBC: 6.8 10*3/uL (ref 4.0–10.5)
nRBC: 0 % (ref 0.0–0.2)

## 2022-11-12 LAB — COMPREHENSIVE METABOLIC PANEL
ALT: 8 U/L (ref 0–44)
AST: 14 U/L — ABNORMAL LOW (ref 15–41)
Albumin: 4.1 g/dL (ref 3.5–5.0)
Alkaline Phosphatase: 111 U/L (ref 38–126)
Anion gap: 5 (ref 5–15)
BUN: 23 mg/dL (ref 8–23)
CO2: 34 mmol/L — ABNORMAL HIGH (ref 22–32)
Calcium: 9.9 mg/dL (ref 8.9–10.3)
Chloride: 100 mmol/L (ref 98–111)
Creatinine, Ser: 1.09 mg/dL (ref 0.61–1.24)
GFR, Estimated: >60 mL/min (ref >60)
Glucose, Bld: 72 mg/dL (ref 70–99)
Potassium: 4.7 mmol/L (ref 3.5–5.1)
Sodium: 139 mmol/L (ref 135–145)
Total Bilirubin: 0.7 mg/dL (ref 0.3–1.2)
Total Protein: 6.6 g/dL (ref 6.5–8.1)

## 2022-11-12 LAB — CEA (ACCESS): CEA (CHCC): 3.39 ng/mL (ref 0.00–5.00)

## 2023-01-10 ENCOUNTER — Other Ambulatory Visit: Payer: Self-pay

## 2023-01-10 DIAGNOSIS — E782 Mixed hyperlipidemia: Secondary | ICD-10-CM

## 2023-01-10 DIAGNOSIS — I25810 Atherosclerosis of coronary artery bypass graft(s) without angina pectoris: Secondary | ICD-10-CM

## 2023-01-10 MED ORDER — PRAVASTATIN SODIUM 40 MG PO TABS: 40.0000 mg | ORAL_TABLET | Freq: Every day | ORAL | 2 refills | Status: DC

## 2023-02-07 ENCOUNTER — Other Ambulatory Visit: Payer: Self-pay | Admitting: *Deleted

## 2023-02-07 DIAGNOSIS — I214 Non-ST elevation (NSTEMI) myocardial infarction: Secondary | ICD-10-CM

## 2023-02-07 DIAGNOSIS — I209 Angina pectoris, unspecified: Secondary | ICD-10-CM

## 2023-02-07 DIAGNOSIS — I25810 Atherosclerosis of coronary artery bypass graft(s) without angina pectoris: Secondary | ICD-10-CM

## 2023-02-07 DIAGNOSIS — I2510 Atherosclerotic heart disease of native coronary artery without angina pectoris: Secondary | ICD-10-CM

## 2023-02-07 MED ORDER — HYDROCHLOROTHIAZIDE 25 MG PO TABS: 25.0000 mg | ORAL_TABLET | Freq: Every day | ORAL | 0 refills | Status: DC

## 2023-05-05 ENCOUNTER — Other Ambulatory Visit: Payer: Self-pay

## 2023-05-05 DIAGNOSIS — I25810 Atherosclerosis of coronary artery bypass graft(s) without angina pectoris: Secondary | ICD-10-CM

## 2023-05-05 DIAGNOSIS — I209 Angina pectoris, unspecified: Secondary | ICD-10-CM

## 2023-05-05 DIAGNOSIS — I214 Non-ST elevation (NSTEMI) myocardial infarction: Secondary | ICD-10-CM

## 2023-05-05 DIAGNOSIS — I2510 Atherosclerotic heart disease of native coronary artery without angina pectoris: Secondary | ICD-10-CM

## 2023-05-05 MED ORDER — HYDROCHLOROTHIAZIDE 25 MG PO TABS
25.0000 mg | ORAL_TABLET | Freq: Every day | ORAL | 1 refills | Status: DC
Start: 2023-05-05 — End: 2023-12-01

## 2023-05-09 ENCOUNTER — Ambulatory Visit (HOSPITAL_COMMUNITY)
Admission: RE | Admit: 2023-05-09 | Discharge: 2023-05-09 | Disposition: A | Discharge status: Home / Self Care | Payer: Medicare HMO | Source: Ambulatory Visit | Attending: Hematology | Admitting: Hematology

## 2023-05-09 ENCOUNTER — Inpatient Hospital Stay: Payer: Medicare HMO

## 2023-05-09 ENCOUNTER — Inpatient Hospital Stay: Payer: Medicare HMO | Attending: Hematology

## 2023-05-09 ENCOUNTER — Other Ambulatory Visit: Payer: Self-pay

## 2023-05-09 DIAGNOSIS — Z85038 Personal history of other malignant neoplasm of large intestine: Secondary | ICD-10-CM | POA: Diagnosis not present

## 2023-05-09 DIAGNOSIS — C3490 Malignant neoplasm of unspecified part of unspecified bronchus or lung: Secondary | ICD-10-CM | POA: Diagnosis not present

## 2023-05-09 DIAGNOSIS — C3431 Malignant neoplasm of lower lobe, right bronchus or lung: Secondary | ICD-10-CM | POA: Diagnosis not present

## 2023-05-09 DIAGNOSIS — Z08 Encounter for follow-up examination after completed treatment for malignant neoplasm: Secondary | ICD-10-CM | POA: Insufficient documentation

## 2023-05-09 DIAGNOSIS — I480 Paroxysmal atrial fibrillation: Secondary | ICD-10-CM | POA: Insufficient documentation

## 2023-05-09 DIAGNOSIS — R252 Cramp and spasm: Secondary | ICD-10-CM | POA: Diagnosis not present

## 2023-05-09 DIAGNOSIS — J432 Centrilobular emphysema: Secondary | ICD-10-CM | POA: Diagnosis not present

## 2023-05-09 DIAGNOSIS — Z7901 Long term (current) use of anticoagulants: Secondary | ICD-10-CM | POA: Diagnosis not present

## 2023-05-09 DIAGNOSIS — C182 Malignant neoplasm of ascending colon: Secondary | ICD-10-CM

## 2023-05-09 DIAGNOSIS — Z85118 Personal history of other malignant neoplasm of bronchus and lung: Secondary | ICD-10-CM | POA: Diagnosis not present

## 2023-05-09 DIAGNOSIS — I700 Atherosclerosis of aorta: Secondary | ICD-10-CM | POA: Diagnosis not present

## 2023-05-09 DIAGNOSIS — C3491 Malignant neoplasm of unspecified part of right bronchus or lung: Secondary | ICD-10-CM

## 2023-05-09 LAB — CBC WITH DIFFERENTIAL/PLATELET
Abs Immature Granulocytes: 0.01 10*3/uL (ref 0.00–0.07)
Basophils Absolute: 0 10*3/uL (ref 0.0–0.1)
Basophils Relative: 0 %
Eosinophils Absolute: 0 10*3/uL (ref 0.0–0.5)
Eosinophils Relative: 1 %
HCT: 42.4 % (ref 39.0–52.0)
Hemoglobin: 14.4 g/dL (ref 13.0–17.0)
Immature Granulocytes: 0 %
Lymphocytes Relative: 21 %
Lymphs Abs: 0.7 10*3/uL (ref 0.7–4.0)
MCH: 30.3 pg (ref 26.0–34.0)
MCHC: 34 g/dL (ref 30.0–36.0)
MCV: 89.1 fL (ref 80.0–100.0)
Monocytes Absolute: 0.6 10*3/uL (ref 0.1–1.0)
Monocytes Relative: 17 %
Neutro Abs: 2.1 10*3/uL (ref 1.7–7.7)
Neutrophils Relative %: 61 %
Platelets: 169 10*3/uL (ref 150–400)
RBC: 4.76 MIL/uL (ref 4.22–5.81)
RDW: 13.6 % (ref 11.5–15.5)
WBC: 3.5 10*3/uL — ABNORMAL LOW (ref 4.0–10.5)
nRBC: 0 % (ref 0.0–0.2)

## 2023-05-09 LAB — CEA (ACCESS): CEA (CHCC): 2.77 ng/mL (ref 0.00–5.00)

## 2023-05-09 LAB — COMPREHENSIVE METABOLIC PANEL
ALT: 11 U/L (ref 0–44)
AST: 15 U/L (ref 15–41)
Albumin: 4.1 g/dL (ref 3.5–5.0)
Alkaline Phosphatase: 88 U/L (ref 38–126)
Anion gap: 8 (ref 5–15)
BUN: 17 mg/dL (ref 8–23)
CO2: 30 mmol/L (ref 22–32)
Calcium: 9.7 mg/dL (ref 8.9–10.3)
Chloride: 99 mmol/L (ref 98–111)
Creatinine, Ser: 0.98 mg/dL (ref 0.61–1.24)
GFR, Estimated: >60 mL/min (ref >60)
Glucose, Bld: 105 mg/dL — ABNORMAL HIGH (ref 70–99)
Potassium: 3.7 mmol/L (ref 3.5–5.1)
Sodium: 137 mmol/L (ref 135–145)
Total Bilirubin: 0.9 mg/dL (ref 0.3–1.2)
Total Protein: 6.4 g/dL — ABNORMAL LOW (ref 6.5–8.1)

## 2023-05-10 NOTE — Progress Notes (Unsigned)
[facility]   [phone] [phone]   Clinic Follow up Note   Patient Care Team: Program, [facility] as PCP - General Ladona Ridgel Doylene Canning, MD as PCP - Electrophysiology (Cardiology) Meriam Sprague, MD as PCP - Cardiology (Cardiology) Pyrtle, Carie Caddy, MD as Consulting Physician (Gastroenterology) Nita Sells, MD as Consulting Physician (Dermatology) Lars Masson, MD as Consulting Physician (Cardiology) Unk Lightning, [place] as Physician Assistant (Gastroenterology) Collier Bullock as Physician Assistant (Cardiology)  Date of Service:  05/10/2023  CHIEF COMPLAINT: f/u of  colon and lung cancers     CURRENT THERAPY:  Surveillance    ASSESSMENT: *** Vincent Mcclure is a 89 y.o. male with   No problem-specific Assessment & Plan notes found for this encounter.  ***   PLAN:   SUMMARY OF ONCOLOGIC HISTORY: Oncology History Overview Note  Cancer Staging Cancer of ascending colon pT4apN1 s/p right colectomy 05/19/2018 Staging form: Colon and Rectum, AJCC 8th Edition - Pathologic stage from 05/19/2018: Stage IIIB (pT4a, pN1, cM0) - Signed by Malachy Mood, MD on 05/24/2018     Cancer of ascending colon pT4apN1 s/p right colectomy 05/19/2018  05/18/2018 Imaging   05/18/2018 CT AP IMPRESSION: 1. There appears to be a nearly obstructing lesion in the region of the hepatic flexure of the colon which is highly concerning for primary colonic neoplasm. Slight haziness of the surrounding soft tissues may be indicative of early local invasion. This appears partially obstructive as evidenced by dilatation of more proximal aspects of the small bowel and colon. 2. Left adrenal lesion is stable compared to prior studies, previously characterized as an adenoma. 3. Aortic atherosclerosis, in addition to left main and 3 vessel coronary artery disease. Status post median sternotomy for CABG. 4. Additional incidental  findings, as above.   Aortic Atherosclerosis (ICD10-I70.0).   05/18/2018 - 05/30/2018 Hospital Admission   Admit date: 05/18/2018 Admission diagnosis: Colon Cancer Discharged on: 05/30/2018   05/19/2018 Cancer Staging   Staging form: Colon and Rectum, AJCC 8th Edition - Pathologic stage from 05/19/2018: Stage IIIB (pT4a, pN1, cM0) - Signed by Malachy Mood, MD on 05/24/2018    05/19/2018 Pathology Results   05/19/2018 Surgical Pathology Diagnosis Colon, segmental resection for tumor, right - INVASIVE COLORECTAL ADENOCARCINOMA, 5 CM. - CARCINOMA FOCALLY LESS THAN 0.1 CM FROM SEROSAL SURFACE. - MARGINS NOT INVOLVED. - METASTATIC CARCINOMA IN ONE OF EIGHT LYMPH NODES (1/9). - BENIGN APPENDIX.   05/19/2018 Surgery   EXPLORATORY LAPAROTOMY with right colectomy with Claud Kelp, MD   05/24/2018 Initial Diagnosis   Cancer of right colon ([name])   06/12/2018 Imaging     06/12/2018 CT CAP IMPRESSION: 1. Evidence all bowel perforation/anastomosed breakdown with large volume intraperitoneal free air fluid in the RIGHT upper quadrant adjacent and above the liver. 2. Large loculated fluid collection in the RIGHT anterior pelvis extending along the LEFT pericolic gutter consists with PERITONEAL ABSCESS. 3. Of note, no large volume intraperitoneal free fluid. Oral contrast passed through the RIGHT colon anastomosis without evidence of leak. Findings could indicate a prior anastomotic breakdown which has healed in the interval with subsequent abscess formation from the prior leak / breakdown.   06/17/2018 Procedure   06/17/2018 US Thoracentesis IMPRESSION: Successful ultrasound guided right thoracentesis yielding 1 L of pleural fluid. No pneumothorax on post-procedure chest x-ray.  No malignancy on pathology   06/20/2018 Imaging   06/20/2018 CT AP IMPRESSION: 1. Decompression of right upper quadrant air and fluid collection  after percutaneous catheter drainage. There remains a tiny rim of fluid as well  as small extraluminal gas bubbles in this region suspicious for continued leak from bowel. 2. Further decrease in size of residual abscess fluid collection in the pelvis after percutaneous catheter drainage. There remains some fluid around the drainage catheter in the right pelvis.   07/04/2018 Imaging   07/04/2018 CT AP IMPRESSION: 1. Further decompression of right perihepatic abscess with resolution of extraluminal air. 2. Relatively stable volume of fluid surrounding the right pelvic drainage catheter.   07/05/2018 Imaging   07/05/2018 CT AP IMPRESSION: 1. No change compared to the CT scan performed 9 hours earlier. 2. Anterior right perihepatic space collection is decompressed by indwelling percutaneous drain, with no residual measurable collection in this location. 3. Thick walled right lower quadrant collection with indwelling percutaneous drain is stable. No new focal fluid collections. 4. Stable postsurgical changes from right hemicolectomy with no evidence of bowel obstruction. Stable reactive wall thickening in the sigmoid colon adjacent to the right lower quadrant collection. 5. Stable small to moderate dependent right pleural effusion. 6. Stable left adrenal adenoma. 7.  Aortic Atherosclerosis (ICD10-I70.0).   07/31/2018 - 01/21/2019 Chemotherapy   Adjuvant Xeloda 2000mg  bid, 7 days on, 7 days off.  Reduced to 2000mg  in the AM and 1500mg  in the PM due to diarrhea. Completed on 01/21/19    04/12/2019 Imaging   CT CAP W contrast 04/12/19  IMPRESSION: 1. Status post right hemicolectomy. 2. No definite metastatic disease in the chest, abdomen, or pelvis. 7 mm right lower lobe pulmonary nodule, obscured by atelectasis on previous chest CT and not included on multiple prior abdomen / pelvis CTs. Close follow-up recommended to ensure stability. 3.  Aortic Atherosclerois (ICD10-170.0)   09/04/2019 Imaging   CT AP W Contrast I MPRESSION: 1. No evidence of local colon cancer  recurrence at the RIGHT hemicolectomy site. 2. No evidence of metastatic disease in the abdomen pelvis. 3. Mild nodularity at the LEFT lung base is favored benign. Recommend attention on routine surveillance 4. Aortic Atherosclerosis (ICD10-I70.0).   07/02/2020 Relapse/Recurrence   FINAL MICROSCOPIC DIAGNOSIS:   A. LUNG, RIGHT LOWER LOBE, NEEDLE CORE BIOPSY:  - Non-small cell carcinoma, see comment    COMMENT:   Immunohistochemical stains for characterization of the tumor phenotype  are pending and will be reported in an addendum.  Dr. Berneice Heinrich reviewed the  case and concurs with the diagnosis. Dr. Mosetta Putt was notified on 07/03/2020.    09/28/2021 Imaging   EXAM: CT CHEST, ABDOMEN, AND PELVIS WITH CONTRAST  IMPRESSION: 1. Further volume loss/consolidation medially in the superior segment left lower lobe probably related to prior radiation therapy. Underlying original lung nodule not readily distinguishable. 2. Suspected mild atypical infectious bronchiolitis in the lingula. Mild atelectasis in the left lower lobe. 3. Other imaging findings of potential clinical significance: Aortic Atherosclerosis (ICD10-I70.0). Coronary atherosclerosis. Systemic atherosclerosis. Multilevel lumbar impingement. Left adrenal adenoma. Right hemicolectomy.   Malignant neoplasm of bronchus of right lower lobe ([name])  05/30/2020 Imaging   CT CAP w contrast IMPRESSION: 1. Significant enlargement of a right lower lobe pulmonary nodule. Suspicious for primary bronchogenic carcinoma. Isolated pulmonary metastasis felt less likely. 2. Otherwise, no evidence of metastatic disease from patient's colon cancer. 3. Aortic atherosclerosis (ICD10-I70.0) and emphysema (ICD10-J43.9). 4. Left adrenal adenoma.   These results will be called to the ordering clinician or representative by the Radiologist Assistant, and communication documented in the PACS or [employer].   06/11/2020 PET scan  IMPRESSION: 1.  The index nodule within the superior segment of the right lower lobe is FDG avid and worrisome for either primary bronchogenic neoplasm versus metastatic disease 2. Within the anteromedial aspect of the left maxillary sinus, in the region of the nasolacrimal duct, there is a focal soft tissue attenuating filling defect which exhibits intense FDG uptake within SUV max of 55.8. The although focal uptake within the paranasal sinuses may be seen with sinusitis primary neoplasm of the sinuses or nasal lacrimal duct cannot be excluded. In light of the intense FDG uptake in this area further investigation with contrast enhanced CT or MRI of the soft tissues of neck to include the maxillary sinuses is recommended. 3.  Aortic Atherosclerosis (ICD10-I70.0). 4. Coronary artery calcifications 5. Prior granulomatous disease.     07/02/2020 Cancer Staging   Staging form: Lung, AJCC 8th Edition - Clinical stage from 07/02/2020: Stage IA2 (cT1b, cN0, cM0) - Signed by Malachy Mood, MD on 07/06/2020   07/06/2020 Initial Diagnosis   Non-small cell lung cancer, right ([name])   07/30/2020 - 08/08/2020 Radiation Therapy   SBRT to lung 07/16/20-08/04/20 with Dr Mitzi Hansen.    11/07/2020 Imaging   CT Chest  IMPRESSION: 1. Decreased size of dominant area of nodularity in the RIGHT chest with surrounding post treatment changes. 2. New area of nodularity along the pleural surface along the posterosuperior segment of the RIGHT lower lobe is suspicious but indeterminate at this time. Could consider short interval follow-up or PET imaging for further assessment as this area would be challenging for tissue sampling. There is also the possibility this area could somehow related to post treatment changes as well, though the morphologic features do raise suspicion. 3. Stable LEFT adrenal adenoma. 4. Changes of CABG. 5. Aortic atherosclerosis.   Aortic Atherosclerosis (ICD10-I70.0).   09/28/2021 Imaging   EXAM: CT  CHEST, ABDOMEN, AND PELVIS WITH CONTRAST  IMPRESSION: 1. Further volume loss/consolidation medially in the superior segment left lower lobe probably related to prior radiation therapy. Underlying original lung nodule not readily distinguishable. 2. Suspected mild atypical infectious bronchiolitis in the lingula. Mild atelectasis in the left lower lobe. 3. Other imaging findings of potential clinical significance: Aortic Atherosclerosis (ICD10-I70.0). Coronary atherosclerosis. Systemic atherosclerosis. Multilevel lumbar impingement. Left adrenal adenoma. Right hemicolectomy.      INTERVAL HISTORY: *** Vincent Mcclure is here for a follow up of  colon and lung cancers . He was last seen by  me on 11/12/2022. He presents to the clinic       All other systems were reviewed with the patient and are negative.  MEDICAL HISTORY:  Past Medical History:  Diagnosis Date   Alcohol abuse    Anemia    Arthritis    hands   Blood transfusion without reported diagnosis    CAD (coronary artery disease)    a. NSTEMI 2018 s/p CABG.   CHF (congestive heart failure) ([name])    Chronic low back pain 10/14/2011   colon ca dx'd 05/18/18   colon cancer   Colon polyps    hyperplastic (2004, 2010) and adenomatous (1990).     COLONIC POLYPS, HX OF 03/05/2008   Esophageal stricture    hx of   Gastric ulcer    GERD 03/05/2008   GERD (gastroesophageal reflux disease) 1994   associated peptic strictures   HYPERLIPIDEMIA 03/05/2008   HYPERTENSION 03/05/2008   Hypertension    IBS 03/05/2008   Impaired glucose tolerance 10/12/2011   Iron deficiency anemia    LEG  CRAMPS 09/02/2010   Lung cancer ([name]) dx'd 06/2020   Myocardial infarction [facility])    May 2018   PAD (peripheral artery disease) ([name]) 2013   PAF (paroxysmal atrial fibrillation) ([name])    PERIPHERAL EDEMA 09/02/2010   PERIPHERAL NEUROPATHY 03/05/2008   Premature atrial contraction    Prostatitis    hx of   PVC's (premature ventricular contractions)     Sinus brady-tachy syndrome ([name])    VARICOSE VEINS, LOWER EXTREMITIES 06/09/2009    SURGICAL HISTORY: Past Surgical History:  Procedure Laterality Date   CATARACT EXTRACTION  bilat   COLECTOMY     COLONOSCOPY     CORONARY ARTERY BYPASS GRAFT N/A 02/15/2017   Procedure: CORONARY ARTERY BYPASS GRAFTING times three  with left internal mammary harvest and endoscopic harvest of Right SVG. Grafts of LIMA to  LAD, SVG to Distal Circ, and to First Diag.;  Surgeon: Delight Ovens, MD;  Location: [facility] OR;  Service: Open Heart Surgery;  Laterality: N/A;   ESOPHAGOGASTRODUODENOSCOPY N/A 11/14/2014   Procedure: ESOPHAGOGASTRODUODENOSCOPY (EGD);  Surgeon: Beverley Fiedler, MD;  Location: [facility] ENDOSCOPY;  Service: Endoscopy;  Laterality: N/A;   IR RADIOLOGIST EVAL & MGMT  07/04/2018   IR RADIOLOGIST EVAL & MGMT  07/18/2018   LAPAROTOMY N/A 05/19/2018   Procedure: EXPLORATORY LAPAROTOMY RIGHT COLECTOMY;  Surgeon: Claud Kelp, MD;  Location: [facility] ORS;  Service: General;  Laterality: N/A;   LEFT HEART CATH AND CORONARY ANGIOGRAPHY N/A 02/14/2017   Procedure: Left Heart Cath and Coronary Angiography;  Surgeon: Lyn Records, MD;  Location: [facility] INVASIVE CV LAB;  Service: Cardiovascular;  Laterality: N/A;   LUMBAR SPINE SURGERY  11/2008   Dr Jeral Fruit   TEE WITHOUT CARDIOVERSION N/A 02/15/2017   Procedure: TRANSESOPHAGEAL ECHOCARDIOGRAM (TEE);  Surgeon: Delight Ovens, MD;  Location: [facility] OR;  Service: Open Heart Surgery;  Laterality: N/A;   UPPER GASTROINTESTINAL ENDOSCOPY      I have reviewed the social history and family history with the patient and they are unchanged from previous note.  ALLERGIES:  has No Known Allergies.  MEDICATIONS:  Current Outpatient Medications  Medication Sig Dispense Refill   acetaminophen (TYLENOL) 500 MG tablet Take 1 tablet (500 mg total) by mouth every 6 (six) hours as needed for mild pain.  0   apixaban (ELIQUIS) 5 MG TABS tablet Take 1 tablet (5 mg total) by mouth 2 (two) times  daily. 60 tablet 5   bacitracin 500 UNIT/GM ointment Apply 1 application topically daily as needed for wound care.     Cholecalciferol (VITAMIN D-3 PO) Take 2 capsules by mouth daily.      hydrochlorothiazide (HYDRODIURIL) 25 MG tablet Take 1 tablet (25 mg total) by mouth daily. 90 tablet 1   metoprolol succinate (TOPROL XL) 25 MG 24 hr tablet Take 1 tablet (25 mg total) by mouth daily. 90 tablet 3   nitroGLYCERIN (NITROSTAT) 0.4 MG SL tablet Place 1 tablet (0.4 mg total) under the tongue every 5 (five) minutes as needed for chest pain (or tightness). 25 tablet 3   pravastatin (PRAVACHOL) 40 MG tablet Take 1 tablet (40 mg total) by mouth at bedtime. 90 tablet 2   silver sulfADIAZINE (SILVADENE) 1 % cream Apply 1 Application topically daily. 50 g 0   No current facility-administered medications for this visit.    PHYSICAL EXAMINATION: ECOG PERFORMANCE STATUS: {CHL ONC ECOG [phone]}  There were no vitals filed for this visit. Wt Readings from Last 3 Encounters:  11/12/22 216 lb 1.6  oz (98 kg)  08/18/22 214 lb (97.1 kg)  08/13/22 217 lb (98.4 kg)    {Only keep what was examined. If exam not performed, can use .CEXAM } GENERAL:alert, no distress and comfortable SKIN: skin color, texture, turgor are normal, no rashes or significant lesions EYES: normal, Conjunctiva are pink and non-injected, sclera clear {OROPHARYNX:no exudate, no erythema and lips, buccal mucosa, and tongue normal}  NECK: supple, thyroid normal size, non-tender, without nodularity LYMPH:  no palpable lymphadenopathy in the cervical, axillary {or inguinal} LUNGS: clear to auscultation and percussion with normal breathing effort HEART: regular rate & rhythm and no murmurs and no lower extremity edema ABDOMEN:abdomen soft, non-tender and normal bowel sounds Musculoskeletal:no cyanosis of digits and no clubbing  NEURO: alert & oriented x 3 with fluent speech, no focal motor/sensory deficits  LABORATORY DATA:  I have  reviewed the data as listed    Latest Ref Rng & Units 05/09/2023    8:15 AM 11/12/2022    9:18 AM 07/27/2022   11:39 AM  CBC  WBC 4.0 - 10.5 K/uL 3.5  6.8  5.3   Hemoglobin 13.0 - 17.0 g/dL 96.2  95.2  84.1   Hematocrit 39.0 - 52.0 % 42.4  45.2  45.1   Platelets 150 - 400 K/uL 169  184  170         Latest Ref Rng & Units 05/09/2023    8:15 AM 11/12/2022    9:18 AM 07/27/2022   11:39 AM  CMP  Glucose 70 - 99 mg/dL 324  72  401   BUN 8 - 23 mg/dL 17  23  21    Creatinine 0.61 - 1.24 mg/dL 0.27  2.53  6.64   Sodium 135 - 145 mmol/L 137  139  136   Potassium 3.5 - 5.1 mmol/L 3.7  4.7  4.3   Chloride 98 - 111 mmol/L 99  100  97   CO2 22 - 32 mmol/L 30  34  34   Calcium 8.9 - 10.3 mg/dL 9.7  9.9  9.5   Total Protein 6.5 - 8.1 g/dL 6.4  6.6  6.9   Total Bilirubin 0.3 - 1.2 mg/dL 0.9  0.7  0.7   Alkaline Phos 38 - 126 U/L 88  111  89   AST 15 - 41 U/L 15  14  15    ALT 0 - 44 U/L 11  8  9        RADIOGRAPHIC STUDIES: I have personally reviewed the radiological images as listed and agreed with the findings in the report. No results found.    No orders of the defined types were placed in this encounter.  All questions were answered. The patient knows to call the clinic with any problems, questions or concerns. No barriers to learning was detected. The total time spent in the appointment was {CHL ONC TIME VISIT - [id number]}.     Salome Holmes, CMA 05/10/2023   I, Monica Martinez, CMA, am acting as scribe for Malachy Mood, MD.   {Add scribe attestation statement}

## 2023-05-11 ENCOUNTER — Other Ambulatory Visit: Payer: Self-pay

## 2023-05-11 ENCOUNTER — Inpatient Hospital Stay: Payer: Medicare HMO | Admitting: Nurse Practitioner

## 2023-05-11 ENCOUNTER — Encounter: Payer: Self-pay | Admitting: Nurse Practitioner

## 2023-05-11 VITALS — BP 137/80 | HR 87 | Temp 98.0°F | Resp 17 | Wt 220.8 lb

## 2023-05-11 DIAGNOSIS — C3431 Malignant neoplasm of lower lobe, right bronchus or lung: Secondary | ICD-10-CM | POA: Diagnosis not present

## 2023-05-11 DIAGNOSIS — C182 Malignant neoplasm of ascending colon: Secondary | ICD-10-CM | POA: Diagnosis not present

## 2023-05-11 DIAGNOSIS — I480 Paroxysmal atrial fibrillation: Secondary | ICD-10-CM

## 2023-05-11 DIAGNOSIS — R252 Cramp and spasm: Secondary | ICD-10-CM

## 2023-05-11 DIAGNOSIS — Z08 Encounter for follow-up examination after completed treatment for malignant neoplasm: Secondary | ICD-10-CM | POA: Diagnosis not present

## 2023-05-11 DIAGNOSIS — Z85038 Personal history of other malignant neoplasm of large intestine: Secondary | ICD-10-CM | POA: Diagnosis not present

## 2023-05-11 DIAGNOSIS — Z7901 Long term (current) use of anticoagulants: Secondary | ICD-10-CM | POA: Diagnosis not present

## 2023-05-11 DIAGNOSIS — Z85118 Personal history of other malignant neoplasm of bronchus and lung: Secondary | ICD-10-CM | POA: Diagnosis not present

## 2023-05-11 NOTE — Assessment & Plan Note (Signed)
squamous cell carcinoma, cT1bN0M0 stage IA -1.7 cm RLL pulmonary nodule seen on 05/30/20 surveillance CT, and hypermetabolic on 06/11/20 PET. No other distant metastasis. -biopsy on 07/02/20 showed squamous cell carcinoma. -s/p SBRT 07/30/20 - 08/08/20 with Dr Mitzi Hansen. He is now on surveillance.  -surveillance CT chest 07/27/22 showed NED. -surveillance CT chest 05/09/2023 showed stable, post radiation changes of superior portion of right lower lung lobe. There is no evidence of recurrent or metastatic disease in the chest  -repeat surveillance CT chest/abd/pelvis in 6 months

## 2023-05-11 NOTE — Assessment & Plan Note (Addendum)
stage IIIB (pT4a, pN1, cM0), MSS -Diagnosed in 05/2018. Treated with hemicolectomy and adjuvant Xeloda 07/31/18 - 01/29/19.  -Latest CT CAP from 09/28/21 showed only post-Covid and post-radiation changes.  -It has been nearly 5 years since his initial diagnosis, his risk of recurrence is minimal now.  He is clinically doing well, no concern for recurrence. -CEA 05/09/2023 normal at 2.77. repeat in 6 months -CT chest/abd/pelvis in 6 months

## 2023-05-11 NOTE — Progress Notes (Addendum)
Patient Care Team: Program, [facility] as PCP - General Ladona Ridgel Doylene Canning, MD as PCP - Electrophysiology (Cardiology) Meriam Sprague, MD as PCP - Cardiology (Cardiology) Pyrtle, Carie Caddy, MD as Consulting Physician (Gastroenterology) Nita Sells, MD as Consulting Physician (Dermatology) Lars Masson, MD as Consulting Physician (Cardiology) Unk Lightning, [place] as Physician Assistant (Gastroenterology) Collier Bullock as Physician Assistant (Cardiology)  Clinic Day:  05/11/2023  Referring physician: Program, Hyrum Fa*  ASSESSMENT & PLAN:   Assessment & Plan: 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 No other distant metastasis. -biopsy on 07/02/20 showed squamous cell carcinoma. -s/p SBRT 07/30/20 - 08/08/20 with Dr Mitzi Hansen. He is now on surveillance.  -surveillance CT chest 07/27/22 showed NED. -surveillance CT chest 05/09/2023 showed stable, post radiation changes of superior portion of right lower lung lobe. There is no evidence of recurrent or metastatic disease in the chest  -repeat surveillance CT chest/abd/pelvis in 6 months    Cancer of ascending colon pT4apN1 s/p right colectomy 05/19/2018 stage IIIB (pT4a, pN1, cM0), MSS -Diagnosed in 05/2018. Treated with hemicolectomy and adjuvant Xeloda 07/31/18 - 01/29/19.  -Latest CT CAP from 09/28/21 showed only post-Covid and post-radiation changes.  -It has been nearly 5 years since his initial diagnosis, his risk of recurrence is minimal now.  He is clinically doing well, no concern for recurrence. -CEA 05/09/2023 normal at 2.77. repeat in 6 months -CT chest/abd/pelvis in 6 months  Paroxysmal atrial fibrillation ([name]) - Management per cardiology.   - Patient is on Eliquis and beta blocker, Toprol-XL  -he is stable and asymptomatic    LEG CRAMPS Patient reporting intermittent leg cramps, left leg worse than right. May be due to varicose veins or history of lumbar disc disease. Will monitor.     The patient understands the plans discussed today and is in agreement with them.  He knows to contact our office if he develops concerns prior to his next appointment.  I provided 25 minutes of face-to-face time during this encounter and > 50% was spent counseling as documented under my assessment and plan.    Carlean Jews, NP  [facility] [place] [address] [place] [address] Dept: [phone] Dept Fax: [phone]   No orders of the defined types were placed in this encounter.     CHIEF COMPLAINT:  CC: f/u cancer of ascending colon and lung cancer   Current Treatment:  surveillance   INTERVAL HISTORY:  Rutledge is here today for repeat clinical assessment. He denies fevers or chills. He denies pain. His appetite is good. His weight has increased 4 pounds over last 6 months .  Cancer of ascending colon pT4apN1 s/p right colectomy 05/19/2018 stage IIIB (pT4a, pN1, cM0), MSS -Diagnosed in 05/2018. Treated with hemicolectomy and adjuvant Xeloda 07/31/18 - 01/29/19.  -Latest CT CAP from 09/28/21 showed only post-Covid and post-radiation changes.  -It has been nearly 5 years since his initial diagnosis, his risk of recurrence is minimal now.  He is clinically doing well, no concern for recurrence. -CEA 05/09/2023 normal at 2.77. 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 No other distant metastasis. -biopsy on 07/02/20 showed squamous cell carcinoma. -s/p SBRT 07/30/20 - 08/08/20 with Dr Mitzi Hansen. He is now on surveillance.  -surveillance CT chest  07/27/22 showed NED. -surveillance CT chest 05/09/2023 showed stable, post radiation changes of superior portion of right lower lung lobe. There is no evidence of recurrent or metastatic disease in the chest  -repeat CT chest in 6 months.   I have reviewed the past medical history, past surgical history, social history and family history with the patient and they are unchanged from previous note.  ALLERGIES:  has No Known Allergies.  MEDICATIONS:  Current Outpatient Medications  Medication Sig Dispense Refill   acetaminophen (TYLENOL) 500 MG tablet Take 1 tablet (500 mg total) by mouth every 6 (six) hours as needed for mild pain.  0   apixaban (ELIQUIS) 5 MG TABS tablet Take 1 tablet (5 mg total) by mouth 2 (two) times daily. 60 tablet 5   bacitracin 500 UNIT/GM ointment Apply 1 application topically daily as needed for wound care.     Cholecalciferol (VITAMIN D-3 PO) Take 2 capsules by mouth daily.      hydrochlorothiazide (HYDRODIURIL) 25 MG tablet Take 1 tablet (25 mg total) by mouth daily. 90 tablet 1   metoprolol succinate (TOPROL XL) 25 MG 24 hr tablet Take 1 tablet (25 mg total) by mouth daily. 90 tablet 3   nitroGLYCERIN (NITROSTAT) 0.4 MG SL tablet Place 1 tablet (0.4 mg total) under the tongue every 5 (five) minutes as needed for chest pain (or tightness). 25 tablet 3   pravastatin (PRAVACHOL) 40 MG tablet Take 1 tablet (40 mg total) by mouth at bedtime. 90 tablet 2   silver sulfADIAZINE (SILVADENE) 1 % cream Apply 1 Application topically daily. 50 g 0   No current facility-administered medications for this visit.    HISTORY OF PRESENT ILLNESS:   Oncology History Overview Note  Cancer Staging Cancer of ascending colon pT4apN1 s/p right colectomy 05/19/2018 Staging form: Colon and Rectum, AJCC 8th Edition - Pathologic stage from 05/19/2018: Stage IIIB (pT4a, pN1, cM0) - Signed by Malachy Mood, MD on 05/24/2018     Cancer of ascending colon pT4apN1 s/p right colectomy 05/19/2018   05/18/2018 Imaging   05/18/2018 CT AP IMPRESSION: 1. There appears to be a nearly obstructing lesion in the region of the hepatic flexure of the colon which is highly concerning for primary colonic neoplasm. Slight haziness of the surrounding soft tissues may be indicative of early local invasion. This appears partially obstructive as evidenced by dilatation of more proximal aspects of the small bowel and colon. 2. Left adrenal lesion is stable compared to prior studies, previously characterized as an adenoma. 3. Aortic atherosclerosis, in addition to left main and 3 vessel coronary artery disease. Status post median sternotomy for CABG. 4. Additional incidental findings, as above.   Aortic Atherosclerosis (ICD10-I70.0).   05/18/2018 - 05/30/2018 Hospital Admission   Admit date: 05/18/2018 Admission diagnosis: Colon Cancer Discharged on: 05/30/2018   05/19/2018 Cancer Staging   Staging form: Colon and Rectum, AJCC 8th Edition - Pathologic stage from 05/19/2018: Stage IIIB (pT4a, pN1, cM0) - Signed by Malachy Mood, MD on 05/24/2018    05/19/2018 Pathology Results   05/19/2018 Surgical Pathology Diagnosis Colon, segmental resection for tumor, right - INVASIVE COLORECTAL ADENOCARCINOMA, 5 CM. - CARCINOMA FOCALLY LESS THAN 0.1 CM FROM SEROSAL SURFACE. - MARGINS NOT INVOLVED. - METASTATIC CARCINOMA IN ONE OF EIGHT LYMPH NODES (1/9). - BENIGN APPENDIX.   05/19/2018 Surgery  EXPLORATORY LAPAROTOMY with right colectomy with Claud Kelp, MD   05/24/2018 Initial Diagnosis   Cancer of right colon ([name])   06/12/2018 Imaging     06/12/2018 CT CAP IMPRESSION: 1. Evidence all bowel perforation/anastomosed breakdown with large volume intraperitoneal free air fluid in the RIGHT upper quadrant adjacent and above the liver. 2. Large loculated fluid collection in the RIGHT anterior pelvis extending along the LEFT pericolic gutter consists with PERITONEAL ABSCESS. 3. Of note, no large volume  intraperitoneal free fluid. Oral contrast passed through the RIGHT colon anastomosis without evidence of leak. Findings could indicate a prior anastomotic breakdown which has healed in the interval with subsequent abscess formation from the prior leak / breakdown.   06/17/2018 Procedure   06/17/2018 US Thoracentesis IMPRESSION: Successful ultrasound guided right thoracentesis yielding 1 L of pleural fluid. No pneumothorax on post-procedure chest x-ray.  No malignancy on pathology   06/20/2018 Imaging   06/20/2018 CT AP IMPRESSION: 1. Decompression of right upper quadrant air and fluid collection after percutaneous catheter drainage. There remains a tiny rim of fluid as well as small extraluminal gas bubbles in this region suspicious for continued leak from bowel. 2. Further decrease in size of residual abscess fluid collection in the pelvis after percutaneous catheter drainage. There remains some fluid around the drainage catheter in the right pelvis.   07/04/2018 Imaging   07/04/2018 CT AP IMPRESSION: 1. Further decompression of right perihepatic abscess with resolution of extraluminal air. 2. Relatively stable volume of fluid surrounding the right pelvic drainage catheter.   07/05/2018 Imaging   07/05/2018 CT AP IMPRESSION: 1. No change compared to the CT scan performed 9 hours earlier. 2. Anterior right perihepatic space collection is decompressed by indwelling percutaneous drain, with no residual measurable collection in this location. 3. Thick walled right lower quadrant collection with indwelling percutaneous drain is stable. No new focal fluid collections. 4. Stable postsurgical changes from right hemicolectomy with no evidence of bowel obstruction. Stable reactive wall thickening in the sigmoid colon adjacent to the right lower quadrant collection. 5. Stable small to moderate dependent right pleural effusion. 6. Stable left adrenal adenoma. 7.  Aortic Atherosclerosis  (ICD10-I70.0).   07/31/2018 - 01/21/2019 Chemotherapy   Adjuvant Xeloda 2000mg  bid, 7 days on, 7 days off.  Reduced to 2000mg  in the AM and 1500mg  in the PM due to diarrhea. Completed on 01/21/19    04/12/2019 Imaging   CT CAP W contrast 04/12/19  IMPRESSION: 1. Status post right hemicolectomy. 2. No definite metastatic disease in the chest, abdomen, or pelvis. 7 mm right lower lobe pulmonary nodule, obscured by atelectasis on previous chest CT and not included on multiple prior abdomen / pelvis CTs. Close follow-up recommended to ensure stability. 3.  Aortic Atherosclerois (ICD10-170.0)   09/04/2019 Imaging   CT AP W Contrast I MPRESSION: 1. No evidence of local colon cancer recurrence at the RIGHT hemicolectomy site. 2. No evidence of metastatic disease in the abdomen pelvis. 3. Mild nodularity at the LEFT lung base is favored benign. Recommend attention on routine surveillance 4. Aortic Atherosclerosis (ICD10-I70.0).   07/02/2020 Relapse/Recurrence   FINAL MICROSCOPIC DIAGNOSIS:   A. LUNG, RIGHT LOWER LOBE, NEEDLE CORE BIOPSY:  - Non-small cell carcinoma, see comment    COMMENT:   Immunohistochemical stains for characterization of the tumor phenotype  are pending and will be reported in an addendum.  Dr. Berneice Heinrich reviewed the  case and concurs with the diagnosis. Dr. Mosetta Putt was notified on 07/03/2020.    09/28/2021 Imaging  EXAM: CT CHEST, ABDOMEN, AND PELVIS WITH CONTRAST  IMPRESSION: 1. Further volume loss/consolidation medially in the superior segment left lower lobe probably related to prior radiation therapy. Underlying original lung nodule not readily distinguishable. 2. Suspected mild atypical infectious bronchiolitis in the lingula. Mild atelectasis in the left lower lobe. 3. Other imaging findings of potential clinical significance: Aortic Atherosclerosis (ICD10-I70.0). Coronary atherosclerosis. Systemic atherosclerosis. Multilevel lumbar impingement. Left  adrenal adenoma. Right hemicolectomy.   Malignant neoplasm of bronchus of right lower lobe ([name])  05/30/2020 Imaging   CT CAP w contrast IMPRESSION: 1. Significant enlargement of a right lower lobe pulmonary nodule. Suspicious for primary bronchogenic carcinoma. Isolated pulmonary metastasis felt less likely. 2. Otherwise, no evidence of metastatic disease from patient's colon cancer. 3. Aortic atherosclerosis (ICD10-I70.0) and emphysema (ICD10-J43.9). 4. Left adrenal adenoma.   These results will be called to the ordering clinician or representative by the Radiologist Assistant, and communication documented in the PACS or [employer].   06/11/2020 PET scan   IMPRESSION: 1. The index nodule within the superior segment of the right lower lobe is FDG avid and worrisome for either primary bronchogenic neoplasm versus metastatic disease 2. Within the anteromedial aspect of the left maxillary sinus, in the region of the nasolacrimal duct, there is a focal soft tissue attenuating filling defect which exhibits intense FDG uptake within SUV max of 55.8. The although focal uptake within the paranasal sinuses may be seen with sinusitis primary neoplasm of the sinuses or nasal lacrimal duct cannot be excluded. In light of the intense FDG uptake in this area further investigation with contrast enhanced CT or MRI of the soft tissues of neck to include the maxillary sinuses is recommended. 3.  Aortic Atherosclerosis (ICD10-I70.0). 4. Coronary artery calcifications 5. Prior granulomatous disease.     07/02/2020 Cancer Staging   Staging form: Lung, AJCC 8th Edition - Clinical stage from 07/02/2020: Stage IA2 (cT1b, cN0, cM0) - Signed by Malachy Mood, MD on 07/06/2020   07/06/2020 Initial Diagnosis   Non-small cell lung cancer, right ([name])   07/30/2020 - 08/08/2020 Radiation Therapy   SBRT to lung 07/16/20-08/04/20 with Dr Mitzi Hansen.    11/07/2020 Imaging   CT Chest  IMPRESSION: 1. Decreased  size of dominant area of nodularity in the RIGHT chest with surrounding post treatment changes. 2. New area of nodularity along the pleural surface along the posterosuperior segment of the RIGHT lower lobe is suspicious but indeterminate at this time. Could consider short interval follow-up or PET imaging for further assessment as this area would be challenging for tissue sampling. There is also the possibility this area could somehow related to post treatment changes as well, though the morphologic features do raise suspicion. 3. Stable LEFT adrenal adenoma. 4. Changes of CABG. 5. Aortic atherosclerosis.   Aortic Atherosclerosis (ICD10-I70.0).   09/28/2021 Imaging   EXAM: CT CHEST, ABDOMEN, AND PELVIS WITH CONTRAST  IMPRESSION: 1. Further volume loss/consolidation medially in the superior segment left lower lobe probably related to prior radiation therapy. Underlying original lung nodule not readily distinguishable. 2. Suspected mild atypical infectious bronchiolitis in the lingula. Mild atelectasis in the left lower lobe. 3. Other imaging findings of potential clinical significance: Aortic Atherosclerosis (ICD10-I70.0). Coronary atherosclerosis. Systemic atherosclerosis. Multilevel lumbar impingement. Left adrenal adenoma. Right hemicolectomy.   07/27/2022 Imaging    IMPRESSION: 1. No evidence of recurrent or metastatic disease. 2. Bilateral adrenal adenomas. 3.  Aortic atherosclerosis (ICD10-I70.0). 4.  Emphysema (ICD10-J43.9)       REVIEW OF SYSTEMS:  Constitutional: Denies fevers, chills or abnormal weight loss Eyes: Denies blurriness of vision Ears, nose, mouth, throat, and face: Denies mucositis or sore throat Respiratory: cough and congestion. Worse at night. Denies shortness of breath or wheezing  Cardiovascular: Denies palpitation, chest discomfort or lower extremity swelling Gastrointestinal:  Denies nausea, heartburn or change in bowel habits Skin: Denies  abnormal skin rashes Lymphatics: Denies new lymphadenopathy or easy bruising Neurological:Denies numbness, tingling or new weaknesses Behavioral/Psych: Mood is stable, no new changes  -having intermittent leg cramps, worse on left side than right  All other systems were reviewed with the patient and are negative.   VITALS:   Today's Vitals   05/11/23 1221  BP: 137/80  Pulse: 87  Resp: 17  Temp: 98 F (36.7 C)  TempSrc: Oral  SpO2: 98%  Weight: 220 lb 12.8 oz (100.2 kg)   Body mass index is 29.95 kg/m.   Wt Readings from Last 3 Encounters:  05/11/23 220 lb 12.8 oz (100.2 kg)  11/12/22 216 lb 1.6 oz (98 kg)  08/18/22 214 lb (97.1 kg)    Body mass index is 29.95 kg/m.  Performance status (ECOG): 1 - Symptomatic but completely ambulatory  PHYSICAL EXAM:   GENERAL:alert, no distress and comfortable SKIN: skin color, texture, turgor are normal, no rashes or significant lesions EYES: normal, Conjunctiva are pink and non-injected, sclera clear OROPHARYNX:no exudate, no erythema and lips, buccal mucosa, and tongue normal  NECK: supple, thyroid normal size, non-tender, without nodularity LYMPH:  no palpable lymphadenopathy in the cervical, axillary or inguinal LUNGS: expiratory rhonchi present. Clear with cough. No cyanosis HEART: irregular rate & rhythm and no murmurs and no lower extremity edema ABDOMEN:abdomen soft, non-tender and normal bowel sounds Musculoskeletal:no cyanosis of digits and no clubbing  NEURO: alert & oriented x 3 with fluent speech, no focal motor/sensory deficits  LABORATORY DATA:  I have reviewed the data as listed    Component Value Date/Time   NA 137 05/09/2023 0815   NA 138 01/13/2021 1019   K 3.7 05/09/2023 0815   CL 99 05/09/2023 0815   CO2 30 05/09/2023 0815   GLUCOSE 105 (H) 05/09/2023 0815   BUN 17 05/09/2023 0815   BUN 17 01/13/2021 1019   CREATININE 0.98 05/09/2023 0815   CREATININE 1.03 09/28/2021 1202   CALCIUM 9.7 05/09/2023  0815   PROT 6.4 (L) 05/09/2023 0815   PROT 6.3 04/24/2019 0917   ALBUMIN 4.1 05/09/2023 0815   ALBUMIN 4.3 04/24/2019 0917   AST 15 05/09/2023 0815   AST 14 (L) 09/28/2021 1202   ALT 11 05/09/2023 0815   ALT 10 09/28/2021 1202   ALKPHOS 88 05/09/2023 0815   BILITOT 0.9 05/09/2023 0815   BILITOT 0.7 09/28/2021 1202   GFRNONAA >60 05/09/2023 0815   GFRNONAA >60 09/28/2021 1202   GFRAA >60 05/28/2020 0855     Lab Results  Component Value Date   WBC 3.5 (L) 05/09/2023   NEUTROABS 2.1 05/09/2023   HGB 14.4 05/09/2023   HCT 42.4 05/09/2023   MCV 89.1 05/09/2023   PLT 169 05/09/2023      Chemistry      Component Value Date/Time   NA 137 05/09/2023 0815   NA 138 01/13/2021 1019   K 3.7 05/09/2023 0815   CL 99 05/09/2023 0815   CO2 30 05/09/2023 0815   BUN 17 05/09/2023 0815   BUN 17 01/13/2021 1019   CREATININE 0.98 05/09/2023 0815   CREATININE 1.03 09/28/2021 1202  Component Value Date/Time   CALCIUM 9.7 05/09/2023 0815   ALKPHOS 88 05/09/2023 0815   AST 15 05/09/2023 0815   AST 14 (L) 09/28/2021 1202   ALT 11 05/09/2023 0815   ALT 10 09/28/2021 1202   BILITOT 0.9 05/09/2023 0815   BILITOT 0.7 09/28/2021 1202       RADIOGRAPHIC STUDIES: I have personally reviewed the radiological images as listed and agreed with the findings in the report. CT CHEST WO CONTRAST  Result Date: 05/11/2023 CLINICAL DATA:  Non-small cell lung cancer; * Tracking Code: BO * EXAM: CT CHEST WITHOUT CONTRAST TECHNIQUE: Multidetector CT imaging of the chest was performed following the standard protocol without IV contrast. RADIATION DOSE REDUCTION: This exam was performed according to the departmental dose-optimization program which includes automated exposure control, adjustment of the mA and/or kV according to patient size and/or use of iterative reconstruction technique. COMPARISON:  Chest CT dated July 27, 2022 FINDINGS: Cardiovascular: Normal heart size. No pericardial effusion.  Normal caliber thoracic aorta severe calcified plaque. Severe coronary artery calcifications status post CABG. Mediastinum/Nodes: Esophagus and thyroid are unremarkable. No enlarged lymph nodes seen in the chest. Lungs/Pleura: Central airways are patent. Mild centrilobular emphysema. Stable postradiation change of the superior portion of the right lower lobe. No pleural effusion. Upper Abdomen: Stable bilateral adrenal gland adenomas. Punctate nonobstructing left renal stone. Musculoskeletal: Prior median sternotomy. No aggressive appearing osseous lesions. IMPRESSION: 1. Stable postradiation change of the superior portion of the right lower lobe. No evidence of recurrent or metastatic disease in the chest. 2. Aortic Atherosclerosis (ICD10-I70.0) and Emphysema (ICD10-J43.9). Electronically Signed   By: Allegra Lai M.D.   On: 05/11/2023 08:28    Plan: -CMP, CBC/diff, and CEA in 6 months  -CT chest/abd/pelvis in 6 months -follow up 6 months   I have seen the patient, examined him. I agree with the assessment and and plan and have edited the notes.   Mr. Peduzzi is clinically doing very well. Lab are unremarkable.  I personally reviewed her CT chest scan images, no concern for residual disease or recurrence.  Will continue cancer surveillance, plan to see her back in 6 months with CT scan.  Malachy Mood MD  05/11/2023

## 2023-05-11 NOTE — Assessment & Plan Note (Signed)
Patient reporting intermittent leg cramps, left leg worse than right. May be due to varicose veins or history of lumbar disc disease. Will monitor.

## 2023-05-11 NOTE — Assessment & Plan Note (Signed)
-   Management per cardiology.   - Patient is on Eliquis and beta blocker, Toprol-XL  -he is stable and asymptomatic

## 2023-06-09 DIAGNOSIS — L570 Actinic keratosis: Secondary | ICD-10-CM | POA: Diagnosis not present

## 2023-06-09 DIAGNOSIS — X32XXXD Exposure to sunlight, subsequent encounter: Secondary | ICD-10-CM | POA: Diagnosis not present

## 2023-06-16 ENCOUNTER — Telehealth: Payer: Self-pay | Admitting: *Deleted

## 2023-06-16 NOTE — Telephone Encounter (Signed)
Pt came into the office to schedule an appointment and left his insurance card.  I LVM for pt to call office to inform him of this. I placed it in the folder under B in an envelope with his name on it.

## 2023-06-20 ENCOUNTER — Other Ambulatory Visit: Payer: Self-pay | Admitting: *Deleted

## 2023-06-20 DIAGNOSIS — I2510 Atherosclerotic heart disease of native coronary artery without angina pectoris: Secondary | ICD-10-CM

## 2023-06-20 DIAGNOSIS — I10 Essential (primary) hypertension: Secondary | ICD-10-CM

## 2023-06-20 DIAGNOSIS — I25810 Atherosclerosis of coronary artery bypass graft(s) without angina pectoris: Secondary | ICD-10-CM

## 2023-06-20 DIAGNOSIS — Z951 Presence of aortocoronary bypass graft: Secondary | ICD-10-CM

## 2023-06-20 DIAGNOSIS — I5189 Other ill-defined heart diseases: Secondary | ICD-10-CM

## 2023-06-20 DIAGNOSIS — E785 Hyperlipidemia, unspecified: Secondary | ICD-10-CM

## 2023-06-20 DIAGNOSIS — E782 Mixed hyperlipidemia: Secondary | ICD-10-CM

## 2023-06-20 MED ORDER — METOPROLOL SUCCINATE ER 25 MG PO TB24
25.0000 mg | ORAL_TABLET | Freq: Every day | ORAL | 0 refills | Status: DC
Start: 2023-06-20 — End: 2023-09-13

## 2023-06-23 ENCOUNTER — Encounter: Payer: Self-pay | Admitting: Family Medicine

## 2023-06-23 ENCOUNTER — Ambulatory Visit (INDEPENDENT_AMBULATORY_CARE_PROVIDER_SITE_OTHER): Payer: Medicare HMO | Admitting: Family Medicine

## 2023-06-23 VITALS — BP 166/93 | HR 76 | Ht 72.0 in | Wt 222.4 lb

## 2023-06-23 DIAGNOSIS — G629 Polyneuropathy, unspecified: Secondary | ICD-10-CM | POA: Diagnosis not present

## 2023-06-23 MED ORDER — AMLODIPINE BESYLATE 5 MG PO TABS: 5.0000 mg | ORAL_TABLET | Freq: Every day | ORAL | 3 refills | Status: DC

## 2023-06-23 NOTE — Progress Notes (Signed)
Acute Office Visit  Subjective:     Patient ID: Vincent Mcclure, male    DOB: 02/19/1932, 89 y.o.   MRN: [id number]  Chief Complaint  Patient presents with   Leg Pain    HPI Patient complaining of leg pain for the past 1.5 years.  Has a history significant for coronary artery disease, A-fib.  Has a history of leg swelling.  Describes the pain as a tingling, burning and numbness.  Does not know of anything that makes the pain worse.  Lying down makes it feel slightly better.  Pain is not worse with walking.  Patient has a history of surgery on his lumbar spine.  Does not currently take any medication or do anything to help relieve symptoms.  Patient's blood pressure was elevated today.  He states he was okay with adding an additional blood pressure medication.  Discussed amlodipine   ROS      Objective:    BP (!) 166/93   Pulse 76   Ht 6' (1.829 m)   Wt 222 lb 6.4 oz (100.9 kg)   SpO2 (!) 83%   BMI 30.16 kg/m    Physical Exam General: Alert and oriented CV: Regular rhythm Pulmonary: Clear bilaterally Extremities: Lower extremity swelling bilaterally.  Hyperpigmentation of the lower extremities bilaterally. Neuro: Decreased/absent sensation in the feet on the dorsal aspect.  Plantar sensation intact.  No results found for any visits on 06/23/23.      Assessment & Plan:   Peripheral polyneuropathy Assessment & Plan: Patient complains of paresthesia, anesthesia in the lower extremities.  Has history of PAD/CAD, has history of surgery in the lumbar spine.  On exam he had poor sensation in the L4, L5 distribution bilaterally. - Lumbar spine x-ray - B12, folate, vitamin D, CMP, CBC -Further workup pending results of the above studies.  Orders: -     B12 and Folate Panel; Future -     VITAMIN D 25 Hydroxy (Vit-D Deficiency, Fractures); Future -     Comprehensive metabolic panel; Future -     CBC; Future -     DG Lumbar Spine 2-3 Views  Other orders -      amLODIPine Besylate; Take 1 tablet (5 mg total) by mouth daily.  Dispense: 90 tablet; Refill: 3     Return in about 4 weeks (around 07/21/2023) for Neuropathy.  Sandre Kitty, MD

## 2023-06-23 NOTE — Patient Instructions (Addendum)
It was nice to see you today,  We addressed the following topics today: -You have neuropathy in both legs.  This can be due to several things, so I will need to get some x-rays and blood test to help rule out some causes. - I would like to see you back in 1 month - You will need to schedule a lab visit to get your blood drawn - You can go to [facility] imaging anytime they are open to get your x-rays. -I have started a blood pressure medication called amlodipine for your blood pressure.  Take this once a day.  Have a great day,  Frederic Jericho, MD

## 2023-06-24 DIAGNOSIS — G629 Polyneuropathy, unspecified: Secondary | ICD-10-CM | POA: Insufficient documentation

## 2023-06-24 NOTE — Assessment & Plan Note (Signed)
Patient complains of paresthesia, anesthesia in the lower extremities.  Has history of PAD/CAD, has history of surgery in the lumbar spine.  On exam he had poor sensation in the L4, L5 distribution bilaterally. - Lumbar spine x-ray - B12, folate, vitamin D, CMP, CBC -Further workup pending results of the above studies.

## 2023-06-27 ENCOUNTER — Other Ambulatory Visit: Payer: Medicare HMO

## 2023-06-27 DIAGNOSIS — E569 Vitamin deficiency, unspecified: Secondary | ICD-10-CM | POA: Diagnosis not present

## 2023-06-27 DIAGNOSIS — G629 Polyneuropathy, unspecified: Secondary | ICD-10-CM

## 2023-06-28 LAB — CBC
Hematocrit: 44.7 % (ref 37.5–51.0)
Hemoglobin: 15.1 g/dL (ref 13.0–17.7)
MCH: 30.2 pg (ref 26.6–33.0)
MCHC: 33.8 g/dL (ref 31.5–35.7)
MCV: 89 fL (ref 79–97)
Platelets: 192 10*3/uL (ref 150–450)
RBC: 5 x10E6/uL (ref 4.14–5.80)
RDW: 12.5 % (ref 11.6–15.4)
WBC: 4.9 10*3/uL (ref 3.4–10.8)

## 2023-06-28 LAB — COMPREHENSIVE METABOLIC PANEL
ALT: 9 IU/L (ref 0–44)
AST: 15 IU/L (ref 0–40)
Albumin: 4.6 g/dL (ref 3.7–4.7)
Alkaline Phosphatase: 110 IU/L (ref 44–121)
BUN/Creatinine Ratio: 17 (ref 10–24)
BUN: 18 mg/dL (ref 8–27)
Bilirubin Total: 0.6 mg/dL (ref 0.0–1.2)
CO2: 23 mmol/L (ref 20–29)
Calcium: 9.8 mg/dL (ref 8.6–10.2)
Chloride: 96 mmol/L (ref 96–106)
Creatinine, Ser: 1.08 mg/dL (ref 0.76–1.27)
Globulin, Total: 1.9 g/dL (ref 1.5–4.5)
Glucose: 91 mg/dL (ref 70–99)
Potassium: 4.1 mmol/L (ref 3.5–5.2)
Sodium: 137 mmol/L (ref 134–144)
Total Protein: 6.5 g/dL (ref 6.0–8.5)
eGFR: 66 mL/min/{1.73_m2} (ref >59)

## 2023-06-28 LAB — B12 AND FOLATE PANEL
Folate: 12 ng/mL (ref >3.0)
Vitamin B-12: 216 pg/mL — ABNORMAL LOW (ref 232–1245)

## 2023-06-28 LAB — VITAMIN D 25 HYDROXY (VIT D DEFICIENCY, FRACTURES): Vit D, 25-Hydroxy: 40.7 ng/mL (ref 30.0–100.0)

## 2023-06-29 ENCOUNTER — Ambulatory Visit
Admission: RE | Admit: 2023-06-29 | Discharge: 2023-06-29 | Disposition: A | Discharge status: Home / Self Care | Payer: Medicare HMO | Source: Ambulatory Visit | Attending: Family Medicine

## 2023-06-29 DIAGNOSIS — M5416 Radiculopathy, lumbar region: Secondary | ICD-10-CM | POA: Diagnosis not present

## 2023-07-01 ENCOUNTER — Other Ambulatory Visit: Payer: Self-pay | Admitting: Family Medicine

## 2023-07-01 MED ORDER — B-12 5000 MCG PO TBDP: 1.0000 | ORAL_TABLET | Freq: Every day | ORAL | 1 refills | Status: AC

## 2023-07-01 NOTE — Progress Notes (Signed)
B12 sent in for b12 deficiency.

## 2023-07-01 NOTE — Progress Notes (Signed)
B12 was sent in to [facility] pharmacy

## 2023-07-21 ENCOUNTER — Encounter: Payer: Self-pay | Admitting: Family Medicine

## 2023-07-21 ENCOUNTER — Ambulatory Visit (INDEPENDENT_AMBULATORY_CARE_PROVIDER_SITE_OTHER): Payer: Medicare HMO | Admitting: Family Medicine

## 2023-07-21 VITALS — BP 132/67 | HR 95 | Ht 72.0 in | Wt 216.8 lb

## 2023-07-21 DIAGNOSIS — G609 Hereditary and idiopathic neuropathy, unspecified: Secondary | ICD-10-CM | POA: Diagnosis not present

## 2023-07-21 DIAGNOSIS — I10 Essential (primary) hypertension: Secondary | ICD-10-CM | POA: Diagnosis not present

## 2023-07-21 DIAGNOSIS — I4891 Unspecified atrial fibrillation: Secondary | ICD-10-CM | POA: Diagnosis not present

## 2023-07-21 MED ORDER — APIXABAN 5 MG PO TABS: 5.0000 mg | ORAL_TABLET | Freq: Two times a day (BID) | ORAL | 5 refills | Status: AC

## 2023-07-21 MED ORDER — DULOXETINE HCL 30 MG PO CPEP: 30.0000 mg | ORAL_CAPSULE | Freq: Every day | ORAL | 1 refills | Status: DC

## 2023-07-21 MED ORDER — FOLIC ACID 5 MG PO CAPS: 1.0000 | ORAL_CAPSULE | Freq: Every day | ORAL | 3 refills | Status: AC

## 2023-07-21 NOTE — Assessment & Plan Note (Signed)
Added amlodipine at his last visit.  Blood pressure today improved.  Continue amlodipine.

## 2023-07-21 NOTE — Assessment & Plan Note (Signed)
Seems like patient has been prescribed Elavil in the past for this but stopped in 2021.  Uncertain why he stopped.  Has never taken duloxetine.  He has mild B12 deficiency.  Have sent in prescription for B12 and folate supplementation.  Sending in duloxetine prescription given the extent of his degenerative vertebral changes would expect a lot of this symptom is related to lumbar radiculopathy.  Patient unlikely a surgical candidate at this time and his most recent attempt for local injections were unsuccessful due to the extent of the degenerative changes And has not tried since that time.  Lower extremities have an akathisia like quality.  He is already on a beta-blocker.  Benzos are not appropriate.  Could consider workup of restless leg syndrome and treatment with Requip. Follow-up in 1 month

## 2023-07-21 NOTE — Patient Instructions (Signed)
It was nice to see you today,  We addressed the following topics today: -I have sent in a prescription for folic acid.  There is also a prescription for vitamin B12 already at [facility] for you.  Take both of these once daily. - I have sent in an order for duloxetine.  This may help with your nerve pain.  Take it once a day.  Follow-up with me in 1 month - I have refilled your apixaban/Eliquis - Please follow-up with your cardiologist.  Schedule an appointment with them, they should be able to set you up with a new provider.  Have a great day,  Frederic Jericho, MD

## 2023-07-21 NOTE — Progress Notes (Signed)
Established Patient Office Visit  Subjective   Patient ID: Vincent Mcclure, male    DOB: April 30, 1932  Age: 89 y.o. MRN: [id number]  Chief Complaint  Patient presents with   Medical Management of Chronic Issues    HPI Peripheral neuropathy-patient still complaining of tingling/numbness in his legs.  He has not picked up the B12 prescription, but does have his previous B12 that he was taking 1000 mcg.  We discussed his x-ray findings and showed him the x-ray images.  Patient has a orthopedist that he is seen in the past at [facility] and has had 2 back operations, most recently 2011.  The last time the patient tried to have a local steroid injection he was unsuccessful because of the extensive degenerative changes in his vertebrae.  Patient feels that he has the urge to move his legs, sometimes feels like they are moving on the road.  We discussed medication options.  Discussed gabapentin and Lyrica and how at his age the side effects may outweigh any benefit he may receive from them.  Discussed duloxetine and how this works as well.  He is willing to try this.  Also advised him to take the B12 and folate supplement to his treatment.  Hypertension-patient has amlodipine.  Has been taking it.  Also takes his metoprolol and hydrochlorothiazide.  Checks his blood pressure at home, typically in the 130s   The ASCVD Risk score (Arnett DK, et al., 2019) failed to calculate for the following reasons:   The 2019 ASCVD risk score is only valid for ages 101 to 53   The patient has a prior MI or stroke diagnosis  Health Maintenance Due  Topic Date Due   Medicare Annual Wellness (AWV)  Never done   Zoster Vaccines- Shingrix (1 of 2) Never done   Pneumonia Vaccine 79+ Years old (1 of 1 - PCV) Never done   DTaP/Tdap/Td (2 - Tdap) 06/10/2019   Colonoscopy  06/10/2022   COVID-19 Vaccine (4 - 2023-24 season) 06/12/2023      Objective:     BP 132/67   Pulse 95   Ht 6' (1.829 m)   Wt 216 lb 12.8 oz  (98.3 kg)   SpO2 95%   BMI 29.40 kg/m    Physical Exam General: Alert, oriented Neuro: Patient with very restless legs, especially the left leg. Psych: Pleasant affect   No results found for any visits on 07/21/23.      Assessment & Plan:   Primary hypertension Assessment & Plan: Added amlodipine at his last visit.  Blood pressure today improved.  Continue amlodipine.   Atrial fibrillation, unspecified type ([name]) -     Apixaban; Take 1 tablet (5 mg total) by mouth 2 (two) times daily.  Dispense: 60 tablet; Refill: 5  Hereditary and idiopathic peripheral neuropathy Assessment & Plan: Seems like patient has been prescribed Elavil in the past for this but stopped in 2021.  Uncertain why he stopped.  Has never taken duloxetine.  He has mild B12 deficiency.  Have sent in prescription for B12 and folate supplementation.  Sending in duloxetine prescription given the extent of his degenerative vertebral changes would expect a lot of this symptom is related to lumbar radiculopathy.  Patient unlikely a surgical candidate at this time and his most recent attempt for local injections were unsuccessful due to the extent of the degenerative changes And has not tried since that time.  Lower extremities have an akathisia like quality.  He is already  on a beta-blocker.  Benzos are not appropriate.  Could consider workup of restless leg syndrome and treatment with Requip. Follow-up in 1 month   Other orders -     Folic Acid; Take 1 capsule (5 mg total) by mouth daily.  Dispense: 90 capsule; Refill: 3 -     DULoxetine HCl; Take 1 capsule (30 mg total) by mouth daily.  Dispense: 30 capsule; Refill: 1     Return in about 4 weeks (around 08/18/2023) for Leg pain.    Sandre Kitty, MD

## 2023-08-11 ENCOUNTER — Ambulatory Visit: Payer: Medicare HMO

## 2023-08-11 DIAGNOSIS — Z0000 Encounter for general adult medical examination without abnormal findings: Secondary | ICD-10-CM | POA: Diagnosis not present

## 2023-08-11 NOTE — Progress Notes (Signed)
Subjective:   Vincent Mcclure is a 89 y.o. male who presents for Medicare Annual/Subsequent preventive examination.  Visit Complete: Virtual I connected with  Audrie Gallus on 08/11/23 by a audio enabled telemedicine application and verified that I am speaking with the correct person using two identifiers.  Patient Location: Home  Provider Location: Office/Clinic  I discussed the limitations of evaluation and management by telemedicine. The patient expressed understanding and agreed to proceed.  Vital Signs: Because this visit was a virtual/telehealth visit, some criteria may be missing or patient reported. Any vitals not documented were not able to be obtained and vitals that have been documented are patient reported.    Cardiac Risk Factors include: advanced age (>44men, >8 women);dyslipidemia;hypertension;male gender     Objective:    Today's Vitals   There is no height or weight on file to calculate BMI.     08/11/2023   12:01 PM 07/10/2020   10:29 AM 07/07/2020   10:33 AM 01/09/2019   12:50 PM 07/06/2018    5:57 AM 06/03/2018    1:17 AM 06/02/2018    4:58 PM  Advanced Directives  Does Patient Have a Medical Advance Directive? Yes No No Yes No  No  Type of Estate agent of [place];Living will        Does patient want to make changes to medical advance directive?  No - Patient declined       Copy of Healthcare Power of Attorney in Chart? No - copy requested        Would patient like information on creating a medical advance directive?  No - Patient declined   No - Patient declined No - Patient declined     Current Medications (verified) Outpatient Encounter Medications as of 08/11/2023  Medication Sig   acetaminophen (TYLENOL) 500 MG tablet Take 1 tablet (500 mg total) by mouth every 6 (six) hours as needed for mild pain.   amLODipine (NORVASC) 5 MG tablet Take 1 tablet (5 mg total) by mouth daily.   apixaban (ELIQUIS) 5 MG TABS tablet Take 1  tablet (5 mg total) by mouth 2 (two) times daily. (Patient taking differently: Take 5 mg by mouth 2 (two) times daily. Takes half a pill twice daily.)   Cholecalciferol (VITAMIN D-3 PO) Take 2 capsules by mouth daily.    hydrochlorothiazide (HYDRODIURIL) 25 MG tablet Take 1 tablet (25 mg total) by mouth daily.   Methylcobalamin (B-12) 5000 MCG TBDP Take 1 tablet by mouth daily.   metoprolol succinate (TOPROL XL) 25 MG 24 hr tablet Take 1 tablet (25 mg total) by mouth daily.   nitroGLYCERIN (NITROSTAT) 0.4 MG SL tablet Place 1 tablet (0.4 mg total) under the tongue every 5 (five) minutes as needed for chest pain (or tightness).   pravastatin (PRAVACHOL) 40 MG tablet Take 1 tablet (40 mg total) by mouth at bedtime.   silver sulfADIAZINE (SILVADENE) 1 % cream Apply 1 Application topically daily.   bacitracin 500 UNIT/GM ointment Apply 1 application topically daily as needed for wound care.   DULoxetine (CYMBALTA) 30 MG capsule Take 1 capsule (30 mg total) by mouth daily. (Patient not taking: Reported on 08/11/2023)   Folic Acid 5 MG CAPS Take 1 capsule (5 mg total) by mouth daily. (Patient not taking: Reported on 08/11/2023)   No facility-administered encounter medications on file as of 08/11/2023.    Allergies (verified) Patient has no known allergies.   History: Past Medical History:  Diagnosis Date  Alcohol abuse    Anemia    Arthritis    hands   Blood transfusion without reported diagnosis    CAD (coronary artery disease)    a. NSTEMI 2018 s/p CABG.   CHF (congestive heart failure) ([name])    Chronic low back pain 10/14/2011   colon ca dx'd 05/18/18   colon cancer   Colon polyps    hyperplastic (2004, 2010) and adenomatous (1990).     COLONIC POLYPS, HX OF 03/05/2008   Esophageal stricture    hx of   Gastric ulcer    GERD 03/05/2008   GERD (gastroesophageal reflux disease) 1994   associated peptic strictures   HYPERLIPIDEMIA 03/05/2008   HYPERTENSION 03/05/2008   Hypertension     IBS 03/05/2008   Impaired glucose tolerance 10/12/2011   Iron deficiency anemia    LEG CRAMPS 09/02/2010   Lung cancer ([name]) dx'd 06/2020   Myocardial infarction [facility])    May 2018   PAD (peripheral artery disease) ([name]) 2013   PAF (paroxysmal atrial fibrillation) ([name])    PERIPHERAL EDEMA 09/02/2010   PERIPHERAL NEUROPATHY 03/05/2008   Premature atrial contraction    Prostatitis    hx of   PVC's (premature ventricular contractions)    Sinus brady-tachy syndrome ([name])    VARICOSE VEINS, LOWER EXTREMITIES 06/09/2009   Past Surgical History:  Procedure Laterality Date   CATARACT EXTRACTION  bilat   COLECTOMY     COLONOSCOPY     CORONARY ARTERY BYPASS GRAFT N/A 02/15/2017   Procedure: CORONARY ARTERY BYPASS GRAFTING times three  with left internal mammary harvest and endoscopic harvest of Right SVG. Grafts of LIMA to  LAD, SVG to Distal Circ, and to First Diag.;  Surgeon: Delight Ovens, MD;  Location: [facility] OR;  Service: Open Heart Surgery;  Laterality: N/A;   ESOPHAGOGASTRODUODENOSCOPY N/A 11/14/2014   Procedure: ESOPHAGOGASTRODUODENOSCOPY (EGD);  Surgeon: Beverley Fiedler, MD;  Location: [facility] ENDOSCOPY;  Service: Endoscopy;  Laterality: N/A;   IR RADIOLOGIST EVAL & MGMT  07/04/2018   IR RADIOLOGIST EVAL & MGMT  07/18/2018   LAPAROTOMY N/A 05/19/2018   Procedure: EXPLORATORY LAPAROTOMY RIGHT COLECTOMY;  Surgeon: Claud Kelp, MD;  Location: [facility] ORS;  Service: General;  Laterality: N/A;   LEFT HEART CATH AND CORONARY ANGIOGRAPHY N/A 02/14/2017   Procedure: Left Heart Cath and Coronary Angiography;  Surgeon: Lyn Records, MD;  Location: [facility] INVASIVE CV LAB;  Service: Cardiovascular;  Laterality: N/A;   LUMBAR SPINE SURGERY  11/2008   Dr Jeral Fruit   TEE WITHOUT CARDIOVERSION N/A 02/15/2017   Procedure: TRANSESOPHAGEAL ECHOCARDIOGRAM (TEE);  Surgeon: Delight Ovens, MD;  Location: [facility] OR;  Service: Open Heart Surgery;  Laterality: N/A;   UPPER GASTROINTESTINAL ENDOSCOPY     Family History  Problem  Relation Age of Onset   Cancer Mother        Brain Cancer   Diabetes Father    Heart disease Father        CAD   Hypertension Father    Stroke Father    Diabetes Sister    Stomach cancer Neg Hx    Pancreatic cancer Neg Hx    Colon cancer Neg Hx    Esophageal cancer Neg Hx    Rectal cancer Neg Hx    Social History   Socioeconomic History   Marital status: Married    Spouse name: Not on file   Number of children: 4   Years of education: Not on file   Highest education level:  Not on file  Occupational History   Occupation: Retired Warehouse manager  Tobacco Use   Smoking status: Former    Current packs/day: 0.00    Average packs/day: 1.3 packs/day for 50.0 years (62.5 ttl pk-yrs)    Types: Cigarettes    Start date: 11/11/1958    Quit date: 11/11/2008    Years since quitting: 14.7   Smokeless tobacco: Former    Types: Chew   Tobacco comments:    smoked off and on for years  Vaping Use   Vaping status: Never Used  Substance and Sexual Activity   Alcohol use: Yes    Alcohol/week: 5.0 standard drinks of alcohol    Types: 2 Glasses of wine, 3 Cans of beer per week    Comment: vodka   Drug use: No   Sexual activity: Never  Other Topics Concern   Not on file  Social History Narrative   ** Merged History Encounter **       4 sons   Social Determinants of Health   Financial Resource Strain: Low Risk  (08/11/2023)   Overall Financial Resource Strain (CARDIA)    Difficulty of Paying Living Expenses: Not hard at all  Food Insecurity: No Food Insecurity (08/11/2023)   Hunger Vital Sign    Worried About Running Out of Food in the Last Year: Never true    Ran Out of Food in the Last Year: Never true  Transportation Needs: No Transportation Needs (08/11/2023)   PRAPARE - Administrator, Civil Service (Medical): No    Lack of Transportation (Non-Medical): No  Physical Activity: Inactive (08/11/2023)   Exercise Vital Sign    Days of Exercise per Week: 0 days     Minutes of Exercise per Session: 0 min  Stress: No Stress Concern Present (08/11/2023)   [employer] of Occupational Health - Occupational Stress Questionnaire    Feeling of Stress : Not at all  Social Connections: Moderately Integrated (08/11/2023)   Social Connection and Isolation Panel [NHANES]    Frequency of Communication with Friends and Family: Three times a week    Frequency of Social Gatherings with Friends and Family: Three times a week    Attends Religious Services: Never    Active Member of Clubs or Organizations: Yes    Attends Engineer, structural: More than 4 times per year    Marital Status: Married    Tobacco Counseling Counseling given: Not Answered Tobacco comments: smoked off and on for years   Clinical Intake:  Pre-visit preparation completed: Yes  Pain : No/denies pain     Nutritional Risks: None Diabetes: No  How often do you need to have someone help you when you read instructions, pamphlets, or other written materials from your doctor or pharmacy?: 1 - Never  Interpreter Needed?: No  Information entered by :: NAllen LPN   Activities of Daily Living    08/11/2023   11:43 AM  In your present state of health, do you have any difficulty performing the following activities:  Hearing? 1  Comment has hearing aids  Vision? 0  Difficulty concentrating or making decisions? 0  Walking or climbing stairs? 1  Dressing or bathing? 0  Doing errands, shopping? 0  Preparing Food and eating ? N  Using the Toilet? N  In the past six months, have you accidently leaked urine? N  Do you have problems with loss of bowel control? N  Managing your Medications? N  Managing your Finances? N  Housekeeping or managing your Housekeeping? N    Patient Care Team: Sandre Kitty, MD as PCP - General (Family Medicine) Marinus Maw, MD as PCP - Electrophysiology (Cardiology) Meriam Sprague, MD (Inactive) as PCP - Cardiology  (Cardiology) Pyrtle, Carie Caddy, MD as Consulting Physician (Gastroenterology) Nita Sells, MD as Consulting Physician (Dermatology) Lars Masson, MD as Consulting Physician (Cardiology) Unk Lightning, [place] as Physician Assistant (Gastroenterology) Dyann Kief, PA-C as Physician Assistant (Cardiology)  Indicate any recent Medical Services you may have received from other than [facility] providers in the past year (date may be approximate).     Assessment:   This is a routine wellness examination for [place].  Hearing/Vision screen Hearing Screening - Comments:: Has hearing aids Vision Screening - Comments:: No regular eye exams   Goals Addressed             This Visit's Progress    Patient Stated       08/11/2023, drink more water       Depression Screen    08/11/2023   12:05 PM 07/21/2023   11:12 AM 06/23/2023    1:39 PM 05/03/2022    2:26 PM 06/09/2021   11:35 AM 08/16/2019    2:18 PM 07/25/2019    8:35 AM  PHQ 2/9 Scores  PHQ - 2 Score 0 0 0 0 0 3 0  PHQ- 9 Score 0 2 1 3  0 5 2    Fall Risk    08/11/2023   12:02 PM 05/03/2022    2:25 PM 06/09/2021   11:23 AM 07/19/2018    2:18 PM 01/17/2018    1:22 PM  Fall Risk   Falls in the past year? 1 1 0 No No  Number falls in past yr: 1 1 0    Injury with Fall? 0 0 0    Risk for fall due to : History of fall(s);Impaired balance/gait;Impaired mobility;Medication side effect Impaired balance/gait;Impaired mobility No Fall Risks    Follow up Falls prevention discussed;Falls evaluation completed Falls evaluation completed Falls evaluation completed      MEDICARE RISK AT HOME: Medicare Risk at Home Any stairs in or around the home?: No If so, are there any without handrails?: No Home free of loose throw rugs in walkways, pet beds, electrical cords, etc?: Yes Adequate lighting in your home to reduce risk of falls?: Yes Life alert?: No Use of a cane, walker or w/c?: Yes Grab bars in the bathroom?: No Shower chair or  bench in shower?: Yes Elevated toilet seat or a handicapped toilet?: No  TIMED UP AND GO:  Was the test performed?  No    Cognitive Function:  6 CIT not administered. Patient was unable to understand the questions.        Immunizations Immunization History  Administered Date(s) Administered   PFIZER(Purple Top)SARS-COV-2 Vaccination 12/20/2019, 01/14/2020, 07/26/2020   Td 06/09/2009    TDAP status: Due, Education has been provided regarding the importance of this vaccine. Advised may receive this vaccine at local pharmacy or Health Dept. Aware to provide a copy of the vaccination record if obtained from local pharmacy or Health Dept. Verbalized acceptance and understanding.  Flu Vaccine status: Declined, Education has been provided regarding the importance of this vaccine but patient still declined. Advised may receive this vaccine at local pharmacy or Health Dept. Aware to provide a copy of the vaccination record if obtained from local pharmacy or Health Dept. Verbalized acceptance and understanding.  Pneumococcal vaccine status:  Declined,  Education has been provided regarding the importance of this vaccine but patient still declined. Advised may receive this vaccine at local pharmacy or Health Dept. Aware to provide a copy of the vaccination record if obtained from local pharmacy or Health Dept. Verbalized acceptance and understanding.   Covid-19 vaccine status: Information provided on how to obtain vaccines.   Qualifies for Shingles Vaccine? Yes   Zostavax completed No   Shingrix Completed?: No.    Education has been provided regarding the importance of this vaccine. Patient has been advised to call insurance company to determine out of pocket expense if they have not yet received this vaccine. Advised may also receive vaccine at local pharmacy or Health Dept. Verbalized acceptance and understanding.  Screening Tests Health Maintenance  Topic Date Due   DTaP/Tdap/Td (2 - Tdap)  06/10/2019   Colonoscopy  06/10/2022   COVID-19 Vaccine (4 - 2023-24 season) 06/12/2023   Zoster Vaccines- Shingrix (1 of 2) 11/11/2023 (Originally 12/20/1950)   Pneumonia Vaccine 76+ Years old (1 of 1 - PCV) 08/10/2024 (Originally 12/21/1998)   Medicare Annual Wellness (AWV)  08/10/2024   HPV VACCINES  Aged Out   INFLUENZA VACCINE  Discontinued    Health Maintenance  Health Maintenance Due  Topic Date Due   DTaP/Tdap/Td (2 - Tdap) 06/10/2019   Colonoscopy  06/10/2022   COVID-19 Vaccine (4 - 2023-24 season) 06/12/2023    Colorectal cancer screening: No longer required.   Lung Cancer Screening: (Low Dose CT Chest recommended if Age 26-80 years, 20 pack-year currently smoking OR have quit w/in 15years.) does not qualify.   Lung Cancer Screening Referral: no  Additional Screening:  Hepatitis C Screening: does not qualify;   Vision Screening: Recommended annual ophthalmology exams for early detection of glaucoma and other disorders of the eye. Is the patient up to date with their annual eye exam?  No  Who is the provider or what is the name of the office in which the patient attends annual eye exams? none If pt is not established with a provider, would they like to be referred to a provider to establish care? No .   Dental Screening: Recommended annual dental exams for proper oral hygiene  Diabetic Foot Exam: n/a  Community Resource Referral / Chronic Care Management: CRR required this visit?  No   CCM required this visit?  No     Plan:     I have personally reviewed and noted the following in the patient's chart:   Medical and social history Use of alcohol, tobacco or illicit drugs  Current medications and supplements including opioid prescriptions. Patient is not currently taking opioid prescriptions. Functional ability and status Nutritional status Physical activity Advanced directives List of other physicians Hospitalizations, surgeries, and ER visits in  previous 12 months Vitals Screenings to include cognitive, depression, and falls Referrals and appointments  In addition, I have reviewed and discussed with patient certain preventive protocols, quality metrics, and best practice recommendations. A written personalized care plan for preventive services as well as general preventive health recommendations were provided to patient.     Barb Merino, LPN   [id number]   After Visit Summary: (Pick Up) Due to this being a telephonic visit, with patients personalized plan was offered to patient and patient has requested to Pick up at office.  Nurse Notes: none

## 2023-08-11 NOTE — Patient Instructions (Signed)
Vincent Mcclure , Thank you for taking time to come for your Medicare Wellness Visit. I appreciate your ongoing commitment to your health goals. Please review the following plan we discussed and let me know if I can assist you in the future.   Referrals/Orders/Follow-Ups/Clinician Recommendations: none  This is a list of the screening recommended for you and due dates:  Health Maintenance  Topic Date Due   DTaP/Tdap/Td vaccine (2 - Tdap) 06/10/2019   Colon Cancer Screening  06/10/2022   COVID-19 Vaccine (4 - 2023-24 season) 06/12/2023   Zoster (Shingles) Vaccine (1 of 2) 11/11/2023*   Pneumonia Vaccine (1 of 1 - PCV) 08/10/2024*   Medicare Annual Wellness Visit  08/10/2024   HPV Vaccine  Aged Out   Flu Shot  Discontinued  *Topic was postponed. The date shown is not the original due date.    Advanced directives: (Copy Requested) Please bring a copy of your health care power of attorney and living will to the office to be added to your chart at your convenience.  Next Medicare Annual Wellness Visit scheduled for next year: No, schedule is not open for next year  insert Preventive Care attachment Insert FALL PREVENTION attachment if needed

## 2023-08-18 ENCOUNTER — Ambulatory Visit (INDEPENDENT_AMBULATORY_CARE_PROVIDER_SITE_OTHER): Payer: Medicare HMO | Admitting: Family Medicine

## 2023-08-18 ENCOUNTER — Encounter: Payer: Self-pay | Admitting: Family Medicine

## 2023-08-18 VITALS — BP 117/74 | HR 94 | Ht 72.0 in | Wt 215.0 lb

## 2023-08-18 DIAGNOSIS — H9191 Unspecified hearing loss, right ear: Secondary | ICD-10-CM | POA: Diagnosis not present

## 2023-08-18 DIAGNOSIS — G609 Hereditary and idiopathic neuropathy, unspecified: Secondary | ICD-10-CM

## 2023-08-18 DIAGNOSIS — I10 Essential (primary) hypertension: Secondary | ICD-10-CM | POA: Diagnosis not present

## 2023-08-18 MED ORDER — B-6 250 MG PO TABS: 1.0000 | ORAL_TABLET | Freq: Every day | ORAL | 3 refills | Status: AC

## 2023-08-18 NOTE — Assessment & Plan Note (Signed)
Blood pressure on recheck was better today.  Initially slightly elevated.  Continue current medications.

## 2023-08-18 NOTE — Patient Instructions (Addendum)
It was nice to see you today,  We addressed the following topics today: -I would like you to take 1 tablet of your folic acid daily. - I have history prescribed vitamin B6.  This is 250 mg/tab.  Take 1 tablet a day. - These will hopefully help your leg symptoms.  Have a great day,  Frederic Jericho, MD

## 2023-08-18 NOTE — Progress Notes (Signed)
   Established Patient Office Visit  Subjective   Patient ID: Vincent Mcclure, male    DOB: 09/07/1932  Age: 89 y.o. MRN: [id number]  Chief Complaint  Patient presents with   Leg Pain    HPI  Neuropathy-patient took duloxetine for a few days but states it "sent him to [place]".  Also gave him urinary retention.  Does feel like the symptoms are somewhat better after starting either the amlodipine or B12.  He has not been taking the folic acid because he was not sure if he was post take 1 pill or 5 pills.  Hypertension-patient taking his amlodipine.  No issues with this medicine.  Patient recently got hearing aids that he ordered online.  Sometimes has issues with getting them to fit properly which leads to feedback noise.  The ASCVD Risk score (Arnett DK, et al., 2019) failed to calculate for the following reasons:   The 2019 ASCVD risk score is only valid for ages 55 to 45   The patient has a prior MI or stroke diagnosis  Health Maintenance Due  Topic Date Due   DTaP/Tdap/Td (2 - Tdap) 06/10/2019   Colonoscopy  06/10/2022   COVID-19 Vaccine (4 - 2023-24 season) 06/12/2023      Objective:     BP 117/74   Pulse 94   Ht 6' (1.829 m)   Wt 215 lb (97.5 kg)   SpO2 97%   BMI 29.16 kg/m    Physical Exam General: Alert, oriented CV: Regular rhythm Pulmonary: Clear bilaterally Neuro: Patient's akathisia of the lower extremity does appear to be less pronounced than previous visit.   No results found for any visits on 08/18/23.      Assessment & Plan:   Hereditary and idiopathic peripheral neuropathy Assessment & Plan: Taking B12.  Was not taking folic acid due to confusion about the dose.  Advised patient to take 1 tablet of the 5 mg of folic acid.  Did not tolerate duloxetine due to side effects of confusion.  Discussed adding B6 for his akathisia.  Overall his akathisia seems less pronounced and he believes his neuropathy is slightly better. - 250 mg B6, 5 mg folic  acid, 5000 mcg B12.   Hearing loss of right ear, unspecified hearing loss type Assessment & Plan: Patient has new set of hearing aids he purchased online.   Primary hypertension Assessment & Plan: Blood pressure on recheck was better today.  Initially slightly elevated.  Continue current medications.   Other orders -     B-6; Take 1 tablet by mouth daily.  Dispense: 30 tablet; Refill: 3     Return in about 2 months (around 10/18/2023) for Neuropathy, hypertension.    Sandre Kitty, MD

## 2023-08-18 NOTE — Assessment & Plan Note (Signed)
Taking B12.  Was not taking folic acid due to confusion about the dose.  Advised patient to take 1 tablet of the 5 mg of folic acid.  Did not tolerate duloxetine due to side effects of confusion.  Discussed adding B6 for his akathisia.  Overall his akathisia seems less pronounced and he believes his neuropathy is slightly better. - 250 mg B6, 5 mg folic acid, 5000 mcg B12.

## 2023-08-18 NOTE — Assessment & Plan Note (Signed)
Patient has new set of hearing aids he purchased online.

## 2023-09-13 ENCOUNTER — Other Ambulatory Visit: Payer: Self-pay | Admitting: Cardiology

## 2023-09-13 DIAGNOSIS — I5189 Other ill-defined heart diseases: Secondary | ICD-10-CM

## 2023-09-13 DIAGNOSIS — E782 Mixed hyperlipidemia: Secondary | ICD-10-CM

## 2023-09-13 DIAGNOSIS — I2510 Atherosclerotic heart disease of native coronary artery without angina pectoris: Secondary | ICD-10-CM

## 2023-09-13 DIAGNOSIS — I25810 Atherosclerosis of coronary artery bypass graft(s) without angina pectoris: Secondary | ICD-10-CM

## 2023-09-13 DIAGNOSIS — E785 Hyperlipidemia, unspecified: Secondary | ICD-10-CM

## 2023-09-13 DIAGNOSIS — I10 Essential (primary) hypertension: Secondary | ICD-10-CM

## 2023-09-13 DIAGNOSIS — Z951 Presence of aortocoronary bypass graft: Secondary | ICD-10-CM

## 2023-10-14 ENCOUNTER — Other Ambulatory Visit: Payer: Self-pay | Admitting: Cardiology

## 2023-10-14 DIAGNOSIS — I2510 Atherosclerotic heart disease of native coronary artery without angina pectoris: Secondary | ICD-10-CM

## 2023-10-14 DIAGNOSIS — Z951 Presence of aortocoronary bypass graft: Secondary | ICD-10-CM

## 2023-10-14 DIAGNOSIS — E785 Hyperlipidemia, unspecified: Secondary | ICD-10-CM

## 2023-10-14 DIAGNOSIS — I10 Essential (primary) hypertension: Secondary | ICD-10-CM

## 2023-10-14 DIAGNOSIS — E782 Mixed hyperlipidemia: Secondary | ICD-10-CM

## 2023-10-14 DIAGNOSIS — I25810 Atherosclerosis of coronary artery bypass graft(s) without angina pectoris: Secondary | ICD-10-CM

## 2023-10-14 DIAGNOSIS — I5189 Other ill-defined heart diseases: Secondary | ICD-10-CM

## 2023-10-17 ENCOUNTER — Other Ambulatory Visit: Payer: Self-pay

## 2023-10-17 DIAGNOSIS — I25810 Atherosclerosis of coronary artery bypass graft(s) without angina pectoris: Secondary | ICD-10-CM

## 2023-10-17 DIAGNOSIS — E782 Mixed hyperlipidemia: Secondary | ICD-10-CM

## 2023-10-17 MED ORDER — PRAVASTATIN SODIUM 40 MG PO TABS: 40.0000 mg | ORAL_TABLET | Freq: Every day | ORAL | 0 refills | Status: DC

## 2023-10-19 ENCOUNTER — Ambulatory Visit (INDEPENDENT_AMBULATORY_CARE_PROVIDER_SITE_OTHER): Payer: Medicare HMO | Admitting: Family Medicine

## 2023-10-19 ENCOUNTER — Encounter: Payer: Self-pay | Admitting: Family Medicine

## 2023-10-19 VITALS — BP 132/89 | HR 79 | Ht 72.0 in | Wt 210.8 lb

## 2023-10-19 DIAGNOSIS — D519 Vitamin B12 deficiency anemia, unspecified: Secondary | ICD-10-CM | POA: Diagnosis not present

## 2023-10-19 DIAGNOSIS — G609 Hereditary and idiopathic neuropathy, unspecified: Secondary | ICD-10-CM | POA: Diagnosis not present

## 2023-10-19 DIAGNOSIS — M546 Pain in thoracic spine: Secondary | ICD-10-CM | POA: Insufficient documentation

## 2023-10-19 NOTE — Patient Instructions (Addendum)
 It was nice to see you today,  We addressed the following topics today: -If your pharmacy does not have vitamin B 6 in the 250 mg dose, you can take 2 tablets of the 100 mg dose every day. - We will recheck your B12 level again today - For your back pain I have ordered an x-ray.  I would also recommend using a heating pad on it.  It is likely a pulled muscle and this will get better in time.  Have a great day,  Rolan Slain, MD

## 2023-10-19 NOTE — Assessment & Plan Note (Signed)
 2 weeks of right-sided midthoracic back pain.  On exam there is minimal tenderness.  Worse if he turns to the left.  Sometimes worse with taking a deep breath.  Lung exam sounds normal.  Will get thoracic x-rays but most likely is a intercostal muscle strain.  Recommended topical heating pad in addition to his Tylenol .

## 2023-10-19 NOTE — Progress Notes (Signed)
   Established Patient Office Visit  Subjective   Patient ID: Vincent Mcclure, male    DOB: [birth year]  Age: 89 y.o. MRN: [id number]  Chief Complaint  Patient presents with   Medical Management of Chronic Issues    HPI  Neuropathy-patient states that the paresthesia is improved.  Still has urge to move his legs.  Takes a B12 every morning.  Has not been able to pick up vitamin B6 because they did not have any of the 250 mg tablets at [facility].  Advised him he can take 2 of the 100 mg doses if that is with a lab work  Thoracic back pain-patient states for 2 weeks he has had mid back pain on the right side.  More noticeable if he sits forward and turns a certain way.  If he turns this way and takes it breath it can make the pain worse.  Takes Tylenol  but no other medication for this.   The ASCVD Risk score (Arnett DK, et al., 2019) failed to calculate for the following reasons:   The 2019 ASCVD risk score is only valid for ages 36 to 57   Risk score cannot be calculated because patient has a medical history suggesting prior/existing ASCVD  Health Maintenance Due  Topic Date Due   DTaP/Tdap/Td (2 - Tdap) 06/10/2019   Colonoscopy  06/10/2022   COVID-19 Vaccine (4 - 2024-25 season) 06/12/2023      Objective:     BP 132/89   Pulse 79   Ht 6' (1.829 m)   Wt 210 lb 12.8 oz (95.6 kg)   SpO2 98%   BMI 28.59 kg/m    Physical Exam General: Alert, oriented CV: Regular rate rhythm Pulmonary: Lungs clear bilaterally MSK: Nontender to palpation over the spine.  Minimal to no tenderness of the right side of the back near the scapula.  Movement of the scapula in cause pain in the shoulder.   No results found for any visits on 10/19/23.      Assessment & Plan:   Acute right-sided thoracic back pain Assessment & Plan: 2 weeks of right-sided midthoracic back pain.  On exam there is minimal tenderness.  Worse if he turns to the left.  Sometimes worse with taking a deep breath.  Lung  exam sounds normal.  Will get thoracic x-rays but most likely is a intercostal muscle strain.  Recommended topical heating pad in addition to his Tylenol .  Orders: -     DG Thoracic Spine 2 View; Future  Hereditary and idiopathic peripheral neuropathy Assessment & Plan: B12 has improved his neuropathy.  Still has akathisia.  Advised patient he can get the 100 mg tablets of B6 and take 2 of them.  He was not able to find 250 mg dose.  They did not have it in the pharmacy.  Orders: -     B12 and Folate Panel     Return in about 2 months (around 12/17/2023) for Neuropathy, hypertension.    Toribio MARLA Slain, MD

## 2023-10-19 NOTE — Assessment & Plan Note (Addendum)
 B12 has improved his neuropathy.  Still has akathisia.  Advised patient he can get the 100 mg tablets of B6 and take 2 of them.  He was not able to find 250 mg dose.  They did not have it in the pharmacy.

## 2023-10-24 LAB — B12 AND FOLATE PANEL
Folate: >20 ng/mL (ref >3.0)
Vitamin B-12: >2000 pg/mL — ABNORMAL HIGH (ref 232–1245)

## 2023-10-31 ENCOUNTER — Other Ambulatory Visit: Payer: Self-pay | Admitting: Cardiology

## 2023-10-31 DIAGNOSIS — E782 Mixed hyperlipidemia: Secondary | ICD-10-CM

## 2023-10-31 DIAGNOSIS — I5189 Other ill-defined heart diseases: Secondary | ICD-10-CM

## 2023-10-31 DIAGNOSIS — I10 Essential (primary) hypertension: Secondary | ICD-10-CM

## 2023-10-31 DIAGNOSIS — E785 Hyperlipidemia, unspecified: Secondary | ICD-10-CM

## 2023-10-31 DIAGNOSIS — I25810 Atherosclerosis of coronary artery bypass graft(s) without angina pectoris: Secondary | ICD-10-CM

## 2023-10-31 DIAGNOSIS — Z951 Presence of aortocoronary bypass graft: Secondary | ICD-10-CM

## 2023-10-31 DIAGNOSIS — I2510 Atherosclerotic heart disease of native coronary artery without angina pectoris: Secondary | ICD-10-CM

## 2023-11-03 ENCOUNTER — Other Ambulatory Visit: Payer: Self-pay | Admitting: *Deleted

## 2023-11-03 ENCOUNTER — Inpatient Hospital Stay: Payer: Medicare HMO | Attending: Internal Medicine

## 2023-11-03 DIAGNOSIS — Z85038 Personal history of other malignant neoplasm of large intestine: Secondary | ICD-10-CM | POA: Diagnosis not present

## 2023-11-03 DIAGNOSIS — R739 Hyperglycemia, unspecified: Secondary | ICD-10-CM | POA: Insufficient documentation

## 2023-11-03 DIAGNOSIS — E876 Hypokalemia: Secondary | ICD-10-CM | POA: Insufficient documentation

## 2023-11-03 DIAGNOSIS — Z08 Encounter for follow-up examination after completed treatment for malignant neoplasm: Secondary | ICD-10-CM | POA: Diagnosis not present

## 2023-11-03 DIAGNOSIS — C182 Malignant neoplasm of ascending colon: Secondary | ICD-10-CM

## 2023-11-03 DIAGNOSIS — C3431 Malignant neoplasm of lower lobe, right bronchus or lung: Secondary | ICD-10-CM

## 2023-11-03 DIAGNOSIS — Z85118 Personal history of other malignant neoplasm of bronchus and lung: Secondary | ICD-10-CM | POA: Diagnosis not present

## 2023-11-03 LAB — CBC WITH DIFFERENTIAL (CANCER CENTER ONLY)
Abs Immature Granulocytes: 0.01 10*3/uL (ref 0.00–0.07)
Basophils Absolute: 0 10*3/uL (ref 0.0–0.1)
Basophils Relative: 0 %
Eosinophils Absolute: 0 10*3/uL (ref 0.0–0.5)
Eosinophils Relative: 0 %
HCT: 44.9 % (ref 39.0–52.0)
Hemoglobin: 15.1 g/dL (ref 13.0–17.0)
Immature Granulocytes: 0 %
Lymphocytes Relative: 18 %
Lymphs Abs: 0.8 10*3/uL (ref 0.7–4.0)
MCH: 30.1 pg (ref 26.0–34.0)
MCHC: 33.6 g/dL (ref 30.0–36.0)
MCV: 89.4 fL (ref 80.0–100.0)
Monocytes Absolute: 0.5 10*3/uL (ref 0.1–1.0)
Monocytes Relative: 10 %
Neutro Abs: 3.3 10*3/uL (ref 1.7–7.7)
Neutrophils Relative %: 72 %
Platelet Count: 183 10*3/uL (ref 150–400)
RBC: 5.02 MIL/uL (ref 4.22–5.81)
RDW: 13.6 % (ref 11.5–15.5)
WBC Count: 4.6 10*3/uL (ref 4.0–10.5)
nRBC: 0 % (ref 0.0–0.2)

## 2023-11-03 LAB — CMP (CANCER CENTER ONLY)
ALT: 12 U/L (ref 0–44)
AST: 16 U/L (ref 15–41)
Albumin: 4.2 g/dL (ref 3.5–5.0)
Alkaline Phosphatase: 94 U/L (ref 38–126)
Anion gap: 10 (ref 5–15)
BUN: 18 mg/dL (ref 8–23)
CO2: 32 mmol/L (ref 22–32)
Calcium: 10 mg/dL (ref 8.9–10.3)
Chloride: 93 mmol/L — ABNORMAL LOW (ref 98–111)
Creatinine: 1.06 mg/dL (ref 0.61–1.24)
GFR, Estimated: >60 mL/min (ref >60)
Glucose, Bld: 240 mg/dL — ABNORMAL HIGH (ref 70–99)
Potassium: 3.2 mmol/L — ABNORMAL LOW (ref 3.5–5.1)
Sodium: 135 mmol/L (ref 135–145)
Total Bilirubin: 0.9 mg/dL (ref 0.0–1.2)
Total Protein: 6.8 g/dL (ref 6.5–8.1)

## 2023-11-03 LAB — CEA (ACCESS): CEA (CHCC): 3.43 ng/mL (ref 0.00–5.00)

## 2023-11-10 ENCOUNTER — Inpatient Hospital Stay (HOSPITAL_BASED_OUTPATIENT_CLINIC_OR_DEPARTMENT_OTHER): Payer: Medicare HMO | Admitting: Hematology

## 2023-11-10 ENCOUNTER — Encounter: Payer: Self-pay | Admitting: Hematology

## 2023-11-10 VITALS — BP 142/87 | HR 96 | Temp 97.7°F | Resp 19 | Wt 210.8 lb

## 2023-11-10 DIAGNOSIS — C3431 Malignant neoplasm of lower lobe, right bronchus or lung: Secondary | ICD-10-CM | POA: Diagnosis not present

## 2023-11-10 DIAGNOSIS — C182 Malignant neoplasm of ascending colon: Secondary | ICD-10-CM | POA: Diagnosis not present

## 2023-11-10 DIAGNOSIS — Z85038 Personal history of other malignant neoplasm of large intestine: Secondary | ICD-10-CM | POA: Diagnosis not present

## 2023-11-10 DIAGNOSIS — Z08 Encounter for follow-up examination after completed treatment for malignant neoplasm: Secondary | ICD-10-CM | POA: Diagnosis not present

## 2023-11-10 DIAGNOSIS — R739 Hyperglycemia, unspecified: Secondary | ICD-10-CM | POA: Diagnosis not present

## 2023-11-10 DIAGNOSIS — E876 Hypokalemia: Secondary | ICD-10-CM | POA: Diagnosis not present

## 2023-11-10 DIAGNOSIS — Z85118 Personal history of other malignant neoplasm of bronchus and lung: Secondary | ICD-10-CM | POA: Diagnosis not present

## 2023-11-10 MED ORDER — POTASSIUM CHLORIDE CRYS ER 10 MEQ PO TBCR: 10.0000 meq | EXTENDED_RELEASE_TABLET | Freq: Every day | ORAL | 0 refills | Status: DC

## 2023-11-10 NOTE — Progress Notes (Signed)
[facility]   Telephone:([phone]) [phone] [phone]   Clinic Follow up Note   Patient Care Team: Sandre Kitty, MD as PCP - General (Family Medicine) Marinus Maw, MD as PCP - Electrophysiology (Cardiology) Meriam Sprague, MD (Inactive) as PCP - Cardiology (Cardiology) Pyrtle, Carie Caddy, MD as Consulting Physician (Gastroenterology) Nita Sells, MD as Consulting Physician (Dermatology) Lars Masson, MD as Consulting Physician (Cardiology) Unk Lightning, [place] as Physician Assistant (Gastroenterology) Collier Bullock as Physician Assistant (Cardiology)  Date of Service:  11/10/2023  CHIEF COMPLAINT: f/u of lung cancer and colon cancer  CURRENT THERAPY:  Cancer surveillance  Oncology History   Cancer of ascending colon pT4apN1 s/p right colectomy 05/19/2018 stage IIIB (pT4a, pN1, cM0), MSS -Diagnosed in 05/2018. Treated with hemicolectomy and adjuvant Xeloda 07/31/18 - 01/29/19.  -Latest CT CAP from 09/28/21 showed only post-Covid and post-radiation changes.  -It has been 5.5 years since his initial diagnosis, his risk of recurrence is minimal now.  He is clinically doing well, no concern for recurrence.  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 No other distant metastasis. -biopsy on 07/02/20 showed squamous cell carcinoma. -s/p SBRT 07/30/20 - 08/08/20 with Dr Mitzi Hansen. He is now on surveillance.  -surveillance CT chest 07/27/22 showed NED.    Assessment and Plan    Lung Cancer (Post-Treatment) Diagnosed in 2021, currently three and a half years post-diagnosis. Due for follow-up CT chest scan in 6 months to monitor for recurrence or progression. Discussed the importance of continued monitoring despite being a cancer survivor. - Order CT chest scan in six months - Schedule follow-up  visit in six months to review CT results  Colon Cancer (Post-Treatment) More than five years post-diagnosis and treatment, with no current evidence of disease. Tumor marker levels are within normal range (0-5). - Continue routine follow-up as needed  Hyperglycemia Elevated blood glucose level at 240 mg/dL, likely related to dietary intake. Previous levels were normal. Discussed dietary modifications to manage blood sugar levels. - Monitor blood glucose levels - Discuss dietary modifications  Hypokalemia Low potassium level noted. Currently on hydrochlorothiazide, which can lower potassium levels. Does not take potassium supplements. Discussed dietary sources of potassium and the need for supplementation. - Prescribe potassium supplement, one tablet daily for two weeks - Encourage dietary intake of potassium-rich foods (bananas, oranges, pickled beets) - Follow up with primary care physician to recheck potassium levels  Plan -Lab reviewed with patient.  I called in potassium chloride 10 mill equal daily for 2 weeks, he will follow-up with his PCP in 2 weeks  - Schedule follow-up visit in six months - Perform blood test and CT chest scan one week before the next visit.  -Due to his advanced age, if his next CT scan is negative, I do not plan to see him anymore after next visit.        SUMMARY OF ONCOLOGIC HISTORY: Oncology History Overview Note  Cancer Staging Cancer of ascending colon pT4apN1 s/p right colectomy 05/19/2018 Staging form: Colon and Rectum, AJCC 8th Edition - Pathologic stage from 05/19/2018: Stage IIIB (pT4a, pN1, cM0) - Signed by Malachy Mood, MD on 05/24/2018     Cancer of ascending colon pT4apN1 s/p right colectomy 05/19/2018  05/18/2018 Imaging   05/18/2018 CT AP IMPRESSION: 1. There appears to be a nearly obstructing lesion in the region  of the hepatic flexure of the colon which is highly concerning for primary colonic neoplasm. Slight haziness of the surrounding  soft tissues may be indicative of early local invasion. This appears partially obstructive as evidenced by dilatation of more proximal aspects of the small bowel and colon. 2. Left adrenal lesion is stable compared to prior studies, previously characterized as an adenoma. 3. Aortic atherosclerosis, in addition to left main and 3 vessel coronary artery disease. Status post median sternotomy for CABG. 4. Additional incidental findings, as above.   Aortic Atherosclerosis (ICD10-I70.0).   05/18/2018 - 05/30/2018 Hospital Admission   Admit date: 05/18/2018 Admission diagnosis: Colon Cancer Discharged on: 05/30/2018   05/19/2018 Cancer Staging   Staging form: Colon and Rectum, AJCC 8th Edition - Pathologic stage from 05/19/2018: Stage IIIB (pT4a, pN1, cM0) - Signed by Malachy Mood, MD on 05/24/2018    05/19/2018 Pathology Results   05/19/2018 Surgical Pathology Diagnosis Colon, segmental resection for tumor, right - INVASIVE COLORECTAL ADENOCARCINOMA, 5 CM. - CARCINOMA FOCALLY LESS THAN 0.1 CM FROM SEROSAL SURFACE. - MARGINS NOT INVOLVED. - METASTATIC CARCINOMA IN ONE OF EIGHT LYMPH NODES (1/9). - BENIGN APPENDIX.   05/19/2018 Surgery   EXPLORATORY LAPAROTOMY with right colectomy with Claud Kelp, MD   05/24/2018 Initial Diagnosis   Cancer of right colon ([name])   06/12/2018 Imaging     06/12/2018 CT CAP IMPRESSION: 1. Evidence all bowel perforation/anastomosed breakdown with large volume intraperitoneal free air fluid in the RIGHT upper quadrant adjacent and above the liver. 2. Large loculated fluid collection in the RIGHT anterior pelvis extending along the LEFT pericolic gutter consists with PERITONEAL ABSCESS. 3. Of note, no large volume intraperitoneal free fluid. Oral contrast passed through the RIGHT colon anastomosis without evidence of leak. Findings could indicate a prior anastomotic breakdown which has healed in the interval with subsequent abscess formation from the prior leak /  breakdown.   06/17/2018 Procedure   06/17/2018 US Thoracentesis IMPRESSION: Successful ultrasound guided right thoracentesis yielding 1 L of pleural fluid. No pneumothorax on post-procedure chest x-ray.  No malignancy on pathology   06/20/2018 Imaging   06/20/2018 CT AP IMPRESSION: 1. Decompression of right upper quadrant air and fluid collection after percutaneous catheter drainage. There remains a tiny rim of fluid as well as small extraluminal gas bubbles in this region suspicious for continued leak from bowel. 2. Further decrease in size of residual abscess fluid collection in the pelvis after percutaneous catheter drainage. There remains some fluid around the drainage catheter in the right pelvis.   07/04/2018 Imaging   07/04/2018 CT AP IMPRESSION: 1. Further decompression of right perihepatic abscess with resolution of extraluminal air. 2. Relatively stable volume of fluid surrounding the right pelvic drainage catheter.   07/05/2018 Imaging   07/05/2018 CT AP IMPRESSION: 1. No change compared to the CT scan performed 9 hours earlier. 2. Anterior right perihepatic space collection is decompressed by indwelling percutaneous drain, with no residual measurable collection in this location. 3. Thick walled right lower quadrant collection with indwelling percutaneous drain is stable. No new focal fluid collections. 4. Stable postsurgical changes from right hemicolectomy with no evidence of bowel obstruction. Stable reactive wall thickening in the sigmoid colon adjacent to the right lower quadrant collection. 5. Stable small to moderate dependent right pleural effusion. 6. Stable left adrenal adenoma. 7.  Aortic Atherosclerosis (ICD10-I70.0).   07/31/2018 - 01/21/2019 Chemotherapy   Adjuvant Xeloda 2000mg  bid, 7 days on, 7 days off.  Reduced to 2000mg  in the AM and 1500mg   in the PM due to diarrhea. Completed on 01/21/19    04/12/2019 Imaging   CT CAP W contrast 04/12/19   IMPRESSION: 1. Status post right hemicolectomy. 2. No definite metastatic disease in the chest, abdomen, or pelvis. 7 mm right lower lobe pulmonary nodule, obscured by atelectasis on previous chest CT and not included on multiple prior abdomen / pelvis CTs. Close follow-up recommended to ensure stability. 3.  Aortic Atherosclerois (ICD10-170.0)   09/04/2019 Imaging   CT AP W Contrast I MPRESSION: 1. No evidence of local colon cancer recurrence at the RIGHT hemicolectomy site. 2. No evidence of metastatic disease in the abdomen pelvis. 3. Mild nodularity at the LEFT lung base is favored benign. Recommend attention on routine surveillance 4. Aortic Atherosclerosis (ICD10-I70.0).   07/02/2020 Relapse/Recurrence   FINAL MICROSCOPIC DIAGNOSIS:   A. LUNG, RIGHT LOWER LOBE, NEEDLE CORE BIOPSY:  - Non-small cell carcinoma, see comment    COMMENT:   Immunohistochemical stains for characterization of the tumor phenotype  are pending and will be reported in an addendum.  Dr. Berneice Heinrich reviewed the  case and concurs with the diagnosis. Dr. Mosetta Putt was notified on 07/03/2020.    09/28/2021 Imaging   EXAM: CT CHEST, ABDOMEN, AND PELVIS WITH CONTRAST  IMPRESSION: 1. Further volume loss/consolidation medially in the superior segment left lower lobe probably related to prior radiation therapy. Underlying original lung nodule not readily distinguishable. 2. Suspected mild atypical infectious bronchiolitis in the lingula. Mild atelectasis in the left lower lobe. 3. Other imaging findings of potential clinical significance: Aortic Atherosclerosis (ICD10-I70.0). Coronary atherosclerosis. Systemic atherosclerosis. Multilevel lumbar impingement. Left adrenal adenoma. Right hemicolectomy.   Malignant neoplasm of bronchus of right lower lobe ([name])  05/30/2020 Imaging   CT CAP w contrast IMPRESSION: 1. Significant enlargement of a right lower lobe pulmonary nodule. Suspicious for primary  bronchogenic carcinoma. Isolated pulmonary metastasis felt less likely. 2. Otherwise, no evidence of metastatic disease from patient's colon cancer. 3. Aortic atherosclerosis (ICD10-I70.0) and emphysema (ICD10-J43.9). 4. Left adrenal adenoma.   These results will be called to the ordering clinician or representative by the Radiologist Assistant, and communication documented in the PACS or [employer].   06/11/2020 PET scan   IMPRESSION: 1. The index nodule within the superior segment of the right lower lobe is FDG avid and worrisome for either primary bronchogenic neoplasm versus metastatic disease 2. Within the anteromedial aspect of the left maxillary sinus, in the region of the nasolacrimal duct, there is a focal soft tissue attenuating filling defect which exhibits intense FDG uptake within SUV max of 55.8. The although focal uptake within the paranasal sinuses may be seen with sinusitis primary neoplasm of the sinuses or nasal lacrimal duct cannot be excluded. In light of the intense FDG uptake in this area further investigation with contrast enhanced CT or MRI of the soft tissues of neck to include the maxillary sinuses is recommended. 3.  Aortic Atherosclerosis (ICD10-I70.0). 4. Coronary artery calcifications 5. Prior granulomatous disease.     07/02/2020 Cancer Staging   Staging form: Lung, AJCC 8th Edition - Clinical stage from 07/02/2020: Stage IA2 (cT1b, cN0, cM0) - Signed by Malachy Mood, MD on 07/06/2020   07/06/2020 Initial Diagnosis   Non-small cell lung cancer, right ([name])   07/30/2020 - 08/08/2020 Radiation Therapy   SBRT to lung 07/16/20-08/04/20 with Dr Mitzi Hansen.    11/07/2020 Imaging   CT Chest  IMPRESSION: 1. Decreased size of dominant area of nodularity in the RIGHT chest with surrounding post treatment changes. 2.  New area of nodularity along the pleural surface along the posterosuperior segment of the RIGHT lower lobe is suspicious but indeterminate at  this time. Could consider short interval follow-up or PET imaging for further assessment as this area would be challenging for tissue sampling. There is also the possibility this area could somehow related to post treatment changes as well, though the morphologic features do raise suspicion. 3. Stable LEFT adrenal adenoma. 4. Changes of CABG. 5. Aortic atherosclerosis.   Aortic Atherosclerosis (ICD10-I70.0).   09/28/2021 Imaging   EXAM: CT CHEST, ABDOMEN, AND PELVIS WITH CONTRAST  IMPRESSION: 1. Further volume loss/consolidation medially in the superior segment left lower lobe probably related to prior radiation therapy. Underlying original lung nodule not readily distinguishable. 2. Suspected mild atypical infectious bronchiolitis in the lingula. Mild atelectasis in the left lower lobe. 3. Other imaging findings of potential clinical significance: Aortic Atherosclerosis (ICD10-I70.0). Coronary atherosclerosis. Systemic atherosclerosis. Multilevel lumbar impingement. Left adrenal adenoma. Right hemicolectomy.   07/27/2022 Imaging    IMPRESSION: 1. No evidence of recurrent or metastatic disease. 2. Bilateral adrenal adenomas. 3.  Aortic atherosclerosis (ICD10-I70.0). 4.  Emphysema (ICD10-J43.9)      Discussed the use of AI scribe software for clinical note transcription with the patient, who gave verbal consent to proceed.  History of Present Illness   The patient, an 89 year [place] with a history of colon cancer and lung cancer, presents for a follow-up visit. He reports a history of frequent falls, with nine falls in October 2023 and seven in the following year. He lives with his wife and is approaching his 89th birthday. He has been experiencing some issues with hearing, but does not find his hearing aids to be very effective. He also reports a recent episode of vomiting after eating meatloaf, but this was an isolated incident. He has a history of smoking, but quit 14 years  ago. He also mentions a sore muscle in his back.         All other systems were reviewed with the patient and are negative.  MEDICAL HISTORY:  Past Medical History:  Diagnosis Date   Alcohol abuse    Anemia    Arthritis    hands   Blood transfusion without reported diagnosis    CAD (coronary artery disease)    a. NSTEMI 2018 s/p CABG.   CHF (congestive heart failure) ([name])    Chronic low back pain 10/14/2011   colon ca dx'd 05/18/18   colon cancer   Colon polyps    hyperplastic (2004, 2010) and adenomatous (1990).     COLONIC POLYPS, HX OF 03/05/2008   Esophageal stricture    hx of   Gastric ulcer    GERD 03/05/2008   GERD (gastroesophageal reflux disease) 1994   associated peptic strictures   HYPERLIPIDEMIA 03/05/2008   HYPERTENSION 03/05/2008   Hypertension    IBS 03/05/2008   Impaired glucose tolerance 10/12/2011   Iron deficiency anemia    LEG CRAMPS 09/02/2010   Lung cancer ([name]) dx'd 06/2020   Myocardial infarction [facility])    May 2018   PAD (peripheral artery disease) ([name]) 2013   PAF (paroxysmal atrial fibrillation) ([name])    PERIPHERAL EDEMA 09/02/2010   PERIPHERAL NEUROPATHY 03/05/2008   Premature atrial contraction    Prostatitis    hx of   PVC's (premature ventricular contractions)    Sinus brady-tachy syndrome ([name])    VARICOSE VEINS, LOWER EXTREMITIES 06/09/2009    SURGICAL HISTORY: Past Surgical History:  Procedure Laterality Date  CATARACT EXTRACTION  bilat   COLECTOMY     COLONOSCOPY     CORONARY ARTERY BYPASS GRAFT N/A 02/15/2017   Procedure: CORONARY ARTERY BYPASS GRAFTING times three  with left internal mammary harvest and endoscopic harvest of Right SVG. Grafts of LIMA to  LAD, SVG to Distal Circ, and to First Diag.;  Surgeon: Delight Ovens, MD;  Location: [facility] OR;  Service: Open Heart Surgery;  Laterality: N/A;   ESOPHAGOGASTRODUODENOSCOPY N/A 11/14/2014   Procedure: ESOPHAGOGASTRODUODENOSCOPY (EGD);  Surgeon: Beverley Fiedler, MD;  Location: [facility] ENDOSCOPY;   Service: Endoscopy;  Laterality: N/A;   IR RADIOLOGIST EVAL & MGMT  07/04/2018   IR RADIOLOGIST EVAL & MGMT  07/18/2018   LAPAROTOMY N/A 05/19/2018   Procedure: EXPLORATORY LAPAROTOMY RIGHT COLECTOMY;  Surgeon: Claud Kelp, MD;  Location: [facility] ORS;  Service: General;  Laterality: N/A;   LEFT HEART CATH AND CORONARY ANGIOGRAPHY N/A 02/14/2017   Procedure: Left Heart Cath and Coronary Angiography;  Surgeon: Lyn Records, MD;  Location: [facility] INVASIVE CV LAB;  Service: Cardiovascular;  Laterality: N/A;   LUMBAR SPINE SURGERY  11/2008   Dr Jeral Fruit   TEE WITHOUT CARDIOVERSION N/A 02/15/2017   Procedure: TRANSESOPHAGEAL ECHOCARDIOGRAM (TEE);  Surgeon: Delight Ovens, MD;  Location: [facility] OR;  Service: Open Heart Surgery;  Laterality: N/A;   UPPER GASTROINTESTINAL ENDOSCOPY      I have reviewed the social history and family history with the patient and they are unchanged from previous note.  ALLERGIES:  is allergic to duloxetine.  MEDICATIONS:  Current Outpatient Medications  Medication Sig Dispense Refill   potassium chloride (KLOR-CON M) 10 MEQ tablet Take 1 tablet (10 mEq total) by mouth daily. 14 tablet 0   acetaminophen (TYLENOL) 500 MG tablet Take 1 tablet (500 mg total) by mouth every 6 (six) hours as needed for mild pain.  0   amLODipine (NORVASC) 5 MG tablet Take 1 tablet (5 mg total) by mouth daily. 90 tablet 3   apixaban (ELIQUIS) 5 MG TABS tablet Take 1 tablet (5 mg total) by mouth 2 (two) times daily. (Patient taking differently: Take 5 mg by mouth 2 (two) times daily. Takes half a pill twice daily.) 60 tablet 5   bacitracin 500 UNIT/GM ointment Apply 1 application topically daily as needed for wound care.     Cholecalciferol (VITAMIN D-3 PO) Take 2 capsules by mouth daily.      Folic Acid 5 MG CAPS Take 1 capsule (5 mg total) by mouth daily. (Patient not taking: Reported on 10/19/2023) 90 capsule 3   hydrochlorothiazide (HYDRODIURIL) 25 MG tablet Take 1 tablet (25 mg total) by mouth daily.  90 tablet 1   Methylcobalamin (B-12) 5000 MCG TBDP Take 1 tablet by mouth daily. 90 tablet 1   metoprolol succinate (TOPROL-XL) 25 MG 24 hr tablet TAKE 1 TABLET BY MOUTH ONCE DAILY . APPOINTMENT REQUIRED FOR FUTURE REFILLS 90 tablet 0   nitroGLYCERIN (NITROSTAT) 0.4 MG SL tablet Place 1 tablet (0.4 mg total) under the tongue every 5 (five) minutes as needed for chest pain (or tightness). 25 tablet 3   pravastatin (PRAVACHOL) 40 MG tablet Take 1 tablet (40 mg total) by mouth at bedtime. 90 tablet 0   Pyridoxine HCl (B-6) 250 MG TABS Take 1 tablet by mouth daily. 30 tablet 3   silver sulfADIAZINE (SILVADENE) 1 % cream Apply 1 Application topically daily. 50 g 0   No current facility-administered medications for this visit.    PHYSICAL EXAMINATION: ECOG PERFORMANCE  STATUS: 1 - Symptomatic but completely ambulatory  Vitals:   11/10/23 1119 11/10/23 1123  BP: (!) 154/88 (!) 142/87  Pulse: 96   Resp: 19   Temp: 97.7 F (36.5 C)   SpO2: 100%    Wt Readings from Last 3 Encounters:  11/10/23 210 lb 12.8 oz (95.6 kg)  10/19/23 210 lb 12.8 oz (95.6 kg)  08/18/23 215 lb (97.5 kg)     GENERAL:alert, no distress and comfortable SKIN: skin color, texture, turgor are normal, no rashes or significant lesions EYES: normal, Conjunctiva are pink and non-injected, sclera clear NECK: supple, thyroid normal size, non-tender, without nodularity LYMPH:  no palpable lymphadenopathy in the cervical, axillary  LUNGS: clear to auscultation and percussion with normal breathing effort with slightly decreased breath sound in the left lung base HEART: regular rate & rhythm and no murmurs and no lower extremity edema ABDOMEN:abdomen soft, non-tender and normal bowel sounds Musculoskeletal:no cyanosis of digits and no clubbing  NEURO: alert & oriented x 3 with fluent speech, no focal motor/sensory deficits   LABORATORY DATA:  I have reviewed the data as listed    Latest Ref Rng & Units 11/03/2023    9:33 AM  06/27/2023   10:50 AM 05/09/2023    8:15 AM  CBC  WBC 4.0 - 10.5 K/uL 4.6  4.9  3.5   Hemoglobin 13.0 - 17.0 g/dL 16.1  09.6  04.5   Hematocrit 39.0 - Vincent.0 % 44.9  44.7  42.4   Platelets 150 - 400 K/uL 183  192  169         Latest Ref Rng & Units 11/03/2023    9:33 AM 06/27/2023   10:50 AM 05/09/2023    8:15 AM  CMP  Glucose 70 - 99 mg/dL 409  91  811   BUN 8 - 23 mg/dL 18  18  17    Creatinine 0.61 - 1.24 mg/dL 9.14  7.82  9.56   Sodium 135 - 145 mmol/L 135  137  137   Potassium 3.5 - 5.1 mmol/L 3.2  4.1  3.7   Chloride 98 - 111 mmol/L 93  96  99   CO2 22 - 32 mmol/L 32  23  30   Calcium 8.9 - 10.3 mg/dL 21.3  9.8  9.7   Total Protein 6.5 - 8.1 g/dL 6.8  6.5  6.4   Total Bilirubin 0.0 - 1.2 mg/dL 0.9  0.6  0.9   Alkaline Phos 38 - 126 U/L 94  110  88   AST 15 - 41 U/L 16  15  15    ALT 0 - 44 U/L 12  9  11        RADIOGRAPHIC STUDIES: I have personally reviewed the radiological images as listed and agreed with the findings in the report. No results found.    Orders Placed This Encounter  Procedures   CT Chest Wo Contrast    Standing Status:   Future    Expected Date:   05/02/2024    Expiration Date:   11/09/2024    Preferred imaging location?:   [facility]   All questions were answered. The patient knows to call the clinic with any problems, questions or concerns. No barriers to learning was detected. The total time spent in the appointment was 25 minutes.     Malachy Mood, MD 11/10/2023

## 2023-11-10 NOTE — Assessment & Plan Note (Signed)
stage IIIB (pT4a, pN1, cM0), MSS -Diagnosed in 05/2018. Treated with hemicolectomy and adjuvant Xeloda 07/31/18 - 01/29/19.  -Latest CT CAP from 09/28/21 showed only post-Covid and post-radiation changes.  -It has been 5.5 years since his initial diagnosis, his risk of recurrence is minimal now.  He is clinically doing well, no concern for recurrence.

## 2023-11-10 NOTE — Assessment & Plan Note (Signed)
squamous cell carcinoma, cT1bN0M0 stage IA -1.7 cm RLL pulmonary nodule seen on 05/30/20 surveillance CT, and hypermetabolic on 06/11/20 PET. No other distant metastasis. -biopsy on 07/02/20 showed squamous cell carcinoma. -s/p SBRT 07/30/20 - 08/08/20 with Dr Mitzi Hansen. He is now on surveillance.  -surveillance CT chest 07/27/22 showed NED.

## 2023-12-01 ENCOUNTER — Other Ambulatory Visit: Payer: Self-pay

## 2023-12-01 DIAGNOSIS — I25810 Atherosclerosis of coronary artery bypass graft(s) without angina pectoris: Secondary | ICD-10-CM

## 2023-12-01 DIAGNOSIS — I209 Angina pectoris, unspecified: Secondary | ICD-10-CM

## 2023-12-01 DIAGNOSIS — I2510 Atherosclerotic heart disease of native coronary artery without angina pectoris: Secondary | ICD-10-CM

## 2023-12-01 DIAGNOSIS — I214 Non-ST elevation (NSTEMI) myocardial infarction: Secondary | ICD-10-CM

## 2023-12-01 MED ORDER — HYDROCHLOROTHIAZIDE 25 MG PO TABS: 25.0000 mg | ORAL_TABLET | Freq: Every day | ORAL | 0 refills | Status: DC
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
## 2023-12-08 DIAGNOSIS — X32XXXD Exposure to sunlight, subsequent encounter: Secondary | ICD-10-CM | POA: Diagnosis not present

## 2023-12-08 DIAGNOSIS — L570 Actinic keratosis: Secondary | ICD-10-CM | POA: Diagnosis not present

## 2023-12-14 ENCOUNTER — Ambulatory Visit: Payer: Medicare HMO | Attending: Cardiovascular Disease | Admitting: Cardiovascular Disease

## 2023-12-14 ENCOUNTER — Encounter: Payer: Self-pay | Admitting: Cardiovascular Disease

## 2023-12-14 VITALS — BP 156/92 | HR 99 | Ht 72.0 in | Wt 211.0 lb

## 2023-12-14 DIAGNOSIS — I480 Paroxysmal atrial fibrillation: Secondary | ICD-10-CM

## 2023-12-14 DIAGNOSIS — E782 Mixed hyperlipidemia: Secondary | ICD-10-CM

## 2023-12-14 DIAGNOSIS — Z951 Presence of aortocoronary bypass graft: Secondary | ICD-10-CM

## 2023-12-14 NOTE — Patient Instructions (Signed)
 Medication Instructions:  Your physician recommends that you continue on your current medications as directed. Please refer to the Current Medication list given to you today.  *If you need a refill on your cardiac medications before your next appointment, please call your pharmacy*  Lab Work: If you have labs (blood work) drawn today and your tests are completely normal, you will receive your results only by: MyChart Message (if you have MyChart) OR A paper copy in the mail If you have any lab test that is abnormal or we need to change your treatment, we will call you to review the results.  Testing/Procedures: None ordered today.  Follow-Up: At [facility], you and your health needs are our priority.  As part of our continuing mission to provide you with exceptional heart care, we have created designated Provider Care Teams.  These Care Teams include your primary Cardiologist (physician) and Advanced Practice Providers (APPs -  Physician Assistants and Nurse Practitioners) who all work together to provide you with the care you need, when you need it.  We recommend signing up for the patient portal called "MyChart".  Sign up information is provided on this After Visit Summary.  MyChart is used to connect with patients for Virtual Visits (Telemedicine).  Patients are able to view lab/test results, encounter notes, upcoming appointments, etc.  Non-urgent messages can be sent to your provider as well.   To learn more about what you can do with MyChart, go to [email or web address].    Your next appointment:   1 year(s)  Provider:   Charlton Haws, MD     Other Instructions    1st Floor: - Lobby - Registration  - Pharmacy  - Lab - Cafe  2nd Floor: - PV Lab - Diagnostic Testing (echo, CT, nuclear med)  3rd Floor: - Vacant  4th Floor: - TCTS (cardiothoracic surgery) - AFib Clinic - Structural Heart Clinic - Vascular Surgery  - Vascular Ultrasound  5th Floor: -  HeartCare Cardiology (general and EP) - Clinical Pharmacy for coumadin, hypertension, lipid, weight-loss medications, and med management appointments    Valet parking services will be available as well.

## 2024-01-14 ENCOUNTER — Other Ambulatory Visit: Payer: Self-pay | Admitting: Cardiovascular Disease

## 2024-01-14 DIAGNOSIS — E782 Mixed hyperlipidemia: Secondary | ICD-10-CM

## 2024-01-14 DIAGNOSIS — I25810 Atherosclerosis of coronary artery bypass graft(s) without angina pectoris: Secondary | ICD-10-CM

## 2024-01-27 ENCOUNTER — Other Ambulatory Visit: Payer: Self-pay | Admitting: Cardiovascular Disease

## 2024-01-27 DIAGNOSIS — I10 Essential (primary) hypertension: Secondary | ICD-10-CM

## 2024-01-27 DIAGNOSIS — I25810 Atherosclerosis of coronary artery bypass graft(s) without angina pectoris: Secondary | ICD-10-CM

## 2024-01-27 DIAGNOSIS — E785 Hyperlipidemia, unspecified: Secondary | ICD-10-CM

## 2024-01-27 DIAGNOSIS — I5189 Other ill-defined heart diseases: Secondary | ICD-10-CM

## 2024-01-27 DIAGNOSIS — E782 Mixed hyperlipidemia: Secondary | ICD-10-CM

## 2024-01-27 DIAGNOSIS — I2510 Atherosclerotic heart disease of native coronary artery without angina pectoris: Secondary | ICD-10-CM

## 2024-01-27 DIAGNOSIS — Z951 Presence of aortocoronary bypass graft: Secondary | ICD-10-CM

## 2024-02-21 DIAGNOSIS — H353131 Nonexudative age-related macular degeneration, bilateral, early dry stage: Secondary | ICD-10-CM | POA: Diagnosis not present

## 2024-02-23 ENCOUNTER — Other Ambulatory Visit: Payer: Self-pay | Admitting: Physician Assistant

## 2024-02-23 DIAGNOSIS — I25810 Atherosclerosis of coronary artery bypass graft(s) without angina pectoris: Secondary | ICD-10-CM

## 2024-02-23 DIAGNOSIS — I2510 Atherosclerotic heart disease of native coronary artery without angina pectoris: Secondary | ICD-10-CM

## 2024-02-23 DIAGNOSIS — I214 Non-ST elevation (NSTEMI) myocardial infarction: Secondary | ICD-10-CM

## 2024-02-23 DIAGNOSIS — I209 Angina pectoris, unspecified: Secondary | ICD-10-CM

## 2024-04-25 ENCOUNTER — Other Ambulatory Visit: Payer: Self-pay | Admitting: Cardiovascular Disease

## 2024-04-25 DIAGNOSIS — E785 Hyperlipidemia, unspecified: Secondary | ICD-10-CM

## 2024-04-25 DIAGNOSIS — I10 Essential (primary) hypertension: Secondary | ICD-10-CM

## 2024-04-25 DIAGNOSIS — I5189 Other ill-defined heart diseases: Secondary | ICD-10-CM

## 2024-04-25 DIAGNOSIS — Z951 Presence of aortocoronary bypass graft: Secondary | ICD-10-CM

## 2024-04-25 DIAGNOSIS — I25810 Atherosclerosis of coronary artery bypass graft(s) without angina pectoris: Secondary | ICD-10-CM

## 2024-04-25 DIAGNOSIS — I2510 Atherosclerotic heart disease of native coronary artery without angina pectoris: Secondary | ICD-10-CM

## 2024-04-25 DIAGNOSIS — E782 Mixed hyperlipidemia: Secondary | ICD-10-CM

## 2024-05-02 ENCOUNTER — Other Ambulatory Visit: Payer: Self-pay

## 2024-05-02 ENCOUNTER — Inpatient Hospital Stay: Payer: Medicare HMO | Attending: Hematology

## 2024-05-02 DIAGNOSIS — Z08 Encounter for follow-up examination after completed treatment for malignant neoplasm: Secondary | ICD-10-CM | POA: Insufficient documentation

## 2024-05-02 DIAGNOSIS — C182 Malignant neoplasm of ascending colon: Secondary | ICD-10-CM

## 2024-05-02 DIAGNOSIS — Z85038 Personal history of other malignant neoplasm of large intestine: Secondary | ICD-10-CM | POA: Diagnosis not present

## 2024-05-02 DIAGNOSIS — R519 Headache, unspecified: Secondary | ICD-10-CM | POA: Diagnosis not present

## 2024-05-02 DIAGNOSIS — Z85828 Personal history of other malignant neoplasm of skin: Secondary | ICD-10-CM | POA: Diagnosis not present

## 2024-05-02 DIAGNOSIS — Z85118 Personal history of other malignant neoplasm of bronchus and lung: Secondary | ICD-10-CM | POA: Insufficient documentation

## 2024-05-02 DIAGNOSIS — C3431 Malignant neoplasm of lower lobe, right bronchus or lung: Secondary | ICD-10-CM

## 2024-05-02 LAB — CMP (CANCER CENTER ONLY)
ALT: 11 U/L (ref 0–44)
AST: 14 U/L — ABNORMAL LOW (ref 15–41)
Albumin: 4.1 g/dL (ref 3.5–5.0)
Alkaline Phosphatase: 87 U/L (ref 38–126)
Anion gap: 6 (ref 5–15)
BUN: 15 mg/dL (ref 8–23)
CO2: 31 mmol/L (ref 22–32)
Calcium: 9.5 mg/dL (ref 8.9–10.3)
Chloride: 101 mmol/L (ref 98–111)
Creatinine: 0.93 mg/dL (ref 0.61–1.24)
GFR, Estimated: >60 mL/min (ref >60)
Glucose, Bld: 133 mg/dL — ABNORMAL HIGH (ref 70–99)
Potassium: 3.6 mmol/L (ref 3.5–5.1)
Sodium: 138 mmol/L (ref 135–145)
Total Bilirubin: 0.8 mg/dL (ref 0.0–1.2)
Total Protein: 6.7 g/dL (ref 6.5–8.1)

## 2024-05-02 LAB — CBC WITH DIFFERENTIAL (CANCER CENTER ONLY)
Abs Immature Granulocytes: 0.01 K/uL (ref 0.00–0.07)
Basophils Absolute: 0 K/uL (ref 0.0–0.1)
Basophils Relative: 0 %
Eosinophils Absolute: 0.1 K/uL (ref 0.0–0.5)
Eosinophils Relative: 1 %
HCT: 41.2 % (ref 39.0–52.0)
Hemoglobin: 14 g/dL (ref 13.0–17.0)
Immature Granulocytes: 0 %
Lymphocytes Relative: 20 %
Lymphs Abs: 0.8 K/uL (ref 0.7–4.0)
MCH: 30.3 pg (ref 26.0–34.0)
MCHC: 34 g/dL (ref 30.0–36.0)
MCV: 89.2 fL (ref 80.0–100.0)
Monocytes Absolute: 0.5 K/uL (ref 0.1–1.0)
Monocytes Relative: 11 %
Neutro Abs: 2.8 K/uL (ref 1.7–7.7)
Neutrophils Relative %: 68 %
Platelet Count: 148 K/uL — ABNORMAL LOW (ref 150–400)
RBC: 4.62 MIL/uL (ref 4.22–5.81)
RDW: 13.7 % (ref 11.5–15.5)
WBC Count: 4.2 K/uL (ref 4.0–10.5)
nRBC: 0 % (ref 0.0–0.2)

## 2024-05-04 ENCOUNTER — Ambulatory Visit (HOSPITAL_COMMUNITY)
Admission: RE | Admit: 2024-05-04 | Discharge: 2024-05-04 | Disposition: A | Discharge status: Home / Self Care | Source: Ambulatory Visit | Attending: Hematology | Admitting: Hematology

## 2024-05-04 DIAGNOSIS — C3490 Malignant neoplasm of unspecified part of unspecified bronchus or lung: Secondary | ICD-10-CM | POA: Diagnosis not present

## 2024-05-04 DIAGNOSIS — C3431 Malignant neoplasm of lower lobe, right bronchus or lung: Secondary | ICD-10-CM | POA: Diagnosis not present

## 2024-05-04 DIAGNOSIS — I700 Atherosclerosis of aorta: Secondary | ICD-10-CM | POA: Diagnosis not present

## 2024-05-08 NOTE — Assessment & Plan Note (Signed)
 squamous cell carcinoma, [id number] stage IA -1.7 cm RLL pulmonary nodule seen on 05/30/20 surveillance CT, and hypermetabolic on 06/11/20 PET. No other distant metastasis. -biopsy on 07/02/20 showed squamous cell carcinoma. -s/p SBRT 07/30/20 - 08/08/20 with Dr Mitzi Hansen. He is now on surveillance.  -surveillance CT chest 07/27/22 showed NED.

## 2024-05-08 NOTE — Assessment & Plan Note (Signed)
 stage IIIB (pT4a, pN1, cM0), MSS -Diagnosed in 05/2018. Treated with hemicolectomy and adjuvant Xeloda  07/31/18 - 01/29/19.  -Latest CT CAP from 09/28/21 showed only post-Covid and post-radiation changes.  -It has been 6 years since his initial diagnosis, his risk of recurrence is minimal now.  He is clinically doing well, no concern for recurrence.

## 2024-05-09 ENCOUNTER — Other Ambulatory Visit: Payer: Self-pay

## 2024-05-09 ENCOUNTER — Inpatient Hospital Stay: Payer: Medicare HMO | Admitting: Hematology

## 2024-05-09 VITALS — BP 160/82 | HR 111 | Temp 97.5°F | Resp 18 | Ht 72.0 in | Wt 214.0 lb

## 2024-05-09 DIAGNOSIS — R519 Headache, unspecified: Secondary | ICD-10-CM | POA: Diagnosis not present

## 2024-05-09 DIAGNOSIS — Z85038 Personal history of other malignant neoplasm of large intestine: Secondary | ICD-10-CM | POA: Diagnosis not present

## 2024-05-09 DIAGNOSIS — C3491 Malignant neoplasm of unspecified part of right bronchus or lung: Secondary | ICD-10-CM

## 2024-05-09 DIAGNOSIS — C3431 Malignant neoplasm of lower lobe, right bronchus or lung: Secondary | ICD-10-CM

## 2024-05-09 DIAGNOSIS — Z85828 Personal history of other malignant neoplasm of skin: Secondary | ICD-10-CM | POA: Diagnosis not present

## 2024-05-09 DIAGNOSIS — C182 Malignant neoplasm of ascending colon: Secondary | ICD-10-CM

## 2024-05-09 DIAGNOSIS — I824Y2 Acute embolism and thrombosis of unspecified deep veins of left proximal lower extremity: Secondary | ICD-10-CM

## 2024-05-09 DIAGNOSIS — Z85118 Personal history of other malignant neoplasm of bronchus and lung: Secondary | ICD-10-CM | POA: Diagnosis not present

## 2024-05-09 DIAGNOSIS — Z08 Encounter for follow-up examination after completed treatment for malignant neoplasm: Secondary | ICD-10-CM | POA: Diagnosis not present

## 2024-05-09 NOTE — Progress Notes (Signed)
 [facility]   [phone] Fax:([phone]   Clinic Follow up Note   Patient Care Team: Chandra Toribio POUR, MD as PCP - General (Family Medicine) Waddell Danelle ORN, MD as PCP - Electrophysiology (Cardiology) Delford Maude BROCKS, MD as PCP - Cardiology (Cardiology) Pyrtle, Gordy HERO, MD as Consulting Physician (Gastroenterology) Shona Rush, MD as Consulting Physician (Dermatology) Maranda Leim DEL, MD as Consulting Physician (Cardiology) Beather Delon Gibson, [place] as Physician Assistant (Gastroenterology) Parthenia Olivia HERO DEVONNA as Physician Assistant (Cardiology) Lanny Callander, MD as Consulting Physician (Hematology and Oncology)  Date of Service:  05/09/2024  CHIEF COMPLAINT: f/u of lung cancer  CURRENT THERAPY:  Cancer surveillance  Oncology History   Cancer of ascending colon pT4apN1 s/p right colectomy 05/19/2018 stage IIIB (pT4a, pN1, cM0), MSS -Diagnosed in 05/2018. Treated with hemicolectomy and adjuvant Xeloda  07/31/18 - 01/29/19.  -Latest CT CAP from 09/28/21 showed only post-Covid and post-radiation changes.  -It has been 6 years since his initial diagnosis, his risk of recurrence is minimal now.  He is clinically doing well, no concern for recurrence.  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 No other distant metastasis. -biopsy on 07/02/20 showed squamous cell carcinoma. -s/p SBRT 07/30/20 - 08/08/20 with Dr Dewey. He is now on surveillance.  -surveillance CT chest 07/27/22 showed NED.  Assessment & Plan Stage Ia lung cancer Lung cancer diagnosed in 2021, treated with radiation therapy. Currently in remission for four years. Recent CT scan shows no evidence of cancer recurrence, with a small nodule consistent with previous findings and likely related to radiation changes. Awaiting radiologist's official  report. - Request radiologist to read the recent CT scan - Follow up if radiologist identifies any concerns  History of colon cancer Colon cancer in remission for over six years.  Status post skin cancer of nose in remission after Mohs surgery Skin cancer on the nose treated with Mohs surgery. Currently in remission.  Left lower extremity swelling, rule out deep vein thrombosis Left leg swelling with the left leg larger than the right. Swelling present for approximately two weeks, with no associated pain on palpation. Differential diagnosis includes deep vein thrombosis (DVT). - Order ultrasound of the left leg to rule out DVT - Schedule ultrasound for after Sunday, preferably Monday or Tuesday  Chronic headaches Chronic headaches occurring regularly, managed with daily acetaminophen . Headaches are well-managed and not worsening.  Plan - I personally reviewed his surveillance CT chest, which showed no concern for new or recurrent lung cancer.  The formal reading still pending. -Left lower extremity Doppler to rule out DVT.  Patient does not want to do until next Monday due to his schedule. - Lab and follow-up in 1 year, his last visit.   SUMMARY OF ONCOLOGIC HISTORY: Oncology History Overview Note  Cancer Staging Cancer of ascending colon pT4apN1 s/p right colectomy 05/19/2018 Staging form: Colon and Rectum, AJCC 8th Edition - Pathologic stage from 05/19/2018: Stage IIIB (pT4a, pN1, cM0) - Signed by Lanny Callander, MD on 05/24/2018     Cancer of ascending colon pT4apN1 s/p right colectomy 05/19/2018  05/18/2018 Imaging   05/18/2018 CT AP IMPRESSION: 1. There appears to be a nearly obstructing lesion in the region of the hepatic flexure of the colon which is highly concerning for primary colonic neoplasm. Slight haziness of the surrounding soft tissues may be indicative of early local  invasion. This appears partially obstructive as evidenced by dilatation of more proximal aspects of the  small bowel and colon. 2. Left adrenal lesion is stable compared to prior studies, previously characterized as an adenoma. 3. Aortic atherosclerosis, in addition to left main and 3 vessel coronary artery disease. Status post median sternotomy for CABG. 4. Additional incidental findings, as above.   Aortic Atherosclerosis (ICD10-I70.0).   05/18/2018 - 05/30/2018 Hospital Admission   Admit date: 05/18/2018 Admission diagnosis: Colon Cancer Discharged on: 05/30/2018   05/19/2018 Cancer Staging   Staging form: Colon and Rectum, AJCC 8th Edition - Pathologic stage from 05/19/2018: Stage IIIB (pT4a, pN1, cM0) - Signed by Lanny Callander, MD on 05/24/2018    05/19/2018 Pathology Results   05/19/2018 Surgical Pathology Diagnosis Colon, segmental resection for tumor, right - INVASIVE COLORECTAL ADENOCARCINOMA, 5 CM. - CARCINOMA FOCALLY LESS THAN 0.1 CM FROM SEROSAL SURFACE. - MARGINS NOT INVOLVED. - METASTATIC CARCINOMA IN ONE OF EIGHT LYMPH NODES (1/9). - BENIGN APPENDIX.   05/19/2018 Surgery   EXPLORATORY LAPAROTOMY with right colectomy with Gail Favorite, MD   05/24/2018 Initial Diagnosis   Cancer of right colon ([name])   06/12/2018 Imaging     06/12/2018 CT CAP IMPRESSION: 1. Evidence all bowel perforation/anastomosed breakdown with large volume intraperitoneal free air fluid in the RIGHT upper quadrant adjacent and above the liver. 2. Large loculated fluid collection in the RIGHT anterior pelvis extending along the LEFT pericolic gutter consists with PERITONEAL ABSCESS. 3. Of note, no large volume intraperitoneal free fluid. Oral contrast passed through the RIGHT colon anastomosis without evidence of leak. Findings could indicate a prior anastomotic breakdown which has healed in the interval with subsequent abscess formation from the prior leak / breakdown.   06/17/2018 Procedure   06/17/2018 US  Thoracentesis IMPRESSION: Successful ultrasound guided right thoracentesis yielding 1 L of pleural  fluid. No pneumothorax on post-procedure chest x-ray.  No malignancy on pathology   06/20/2018 Imaging   06/20/2018 CT AP IMPRESSION: 1. Decompression of right upper quadrant air and fluid collection after percutaneous catheter drainage. There remains a tiny rim of fluid as well as small extraluminal gas bubbles in this region suspicious for continued leak from bowel. 2. Further decrease in size of residual abscess fluid collection in the pelvis after percutaneous catheter drainage. There remains some fluid around the drainage catheter in the right pelvis.   07/04/2018 Imaging   07/04/2018 CT AP IMPRESSION: 1. Further decompression of right perihepatic abscess with resolution of extraluminal air. 2. Relatively stable volume of fluid surrounding the right pelvic drainage catheter.   07/05/2018 Imaging   07/05/2018 CT AP IMPRESSION: 1. No change compared to the CT scan performed 9 hours earlier. 2. Anterior right perihepatic space collection is decompressed by indwelling percutaneous drain, with no residual measurable collection in this location. 3. Thick walled right lower quadrant collection with indwelling percutaneous drain is stable. No new focal fluid collections. 4. Stable postsurgical changes from right hemicolectomy with no evidence of bowel obstruction. Stable reactive wall thickening in the sigmoid colon adjacent to the right lower quadrant collection. 5. Stable small to moderate dependent right pleural effusion. 6. Stable left adrenal adenoma. 7.  Aortic Atherosclerosis (ICD10-I70.0).   07/31/2018 - 01/21/2019 Chemotherapy   Adjuvant Xeloda  2000mg  bid, 7 days on, 7 days off.  Reduced to 2000mg  in the AM and 1500mg  in the PM due to diarrhea. Completed on 01/21/19    04/12/2019 Imaging   CT CAP W contrast 04/12/19  IMPRESSION: 1. Status post right hemicolectomy.  2. No definite metastatic disease in the chest, abdomen, or pelvis. 7 mm right lower lobe pulmonary nodule,  obscured by atelectasis on previous chest CT and not included on multiple prior abdomen / pelvis CTs. Close follow-up recommended to ensure stability. 3.  Aortic Atherosclerois (ICD10-170.0)   09/04/2019 Imaging   CT AP W Contrast I MPRESSION: 1. No evidence of local colon cancer recurrence at the RIGHT hemicolectomy site. 2. No evidence of metastatic disease in the abdomen pelvis. 3. Mild nodularity at the LEFT lung base is favored benign. Recommend attention on routine surveillance 4. Aortic Atherosclerosis (ICD10-I70.0).   07/02/2020 Relapse/Recurrence   FINAL MICROSCOPIC DIAGNOSIS:   A. LUNG, RIGHT LOWER LOBE, NEEDLE CORE BIOPSY:  - Non-small cell carcinoma, see comment    COMMENT:   Immunohistochemical stains for characterization of the tumor phenotype  are pending and will be reported in an addendum.  Dr. Alvaro reviewed the  case and concurs with the diagnosis. Dr. Lanny was notified on 07/03/2020.    09/28/2021 Imaging   EXAM: CT CHEST, ABDOMEN, AND PELVIS WITH CONTRAST  IMPRESSION: 1. Further volume loss/consolidation medially in the superior segment left lower lobe probably related to prior radiation therapy. Underlying original lung nodule not readily distinguishable. 2. Suspected mild atypical infectious bronchiolitis in the lingula. Mild atelectasis in the left lower lobe. 3. Other imaging findings of potential clinical significance: Aortic Atherosclerosis (ICD10-I70.0). Coronary atherosclerosis. Systemic atherosclerosis. Multilevel lumbar impingement. Left adrenal adenoma. Right hemicolectomy.   Malignant neoplasm of bronchus of right lower lobe ([name])  05/30/2020 Imaging   CT CAP w contrast IMPRESSION: 1. Significant enlargement of a right lower lobe pulmonary nodule. Suspicious for primary bronchogenic carcinoma. Isolated pulmonary metastasis felt less likely. 2. Otherwise, no evidence of metastatic disease from patient's colon cancer. 3. Aortic  atherosclerosis (ICD10-I70.0) and emphysema (ICD10-J43.9). 4. Left adrenal adenoma.   These results will be called to the ordering clinician or representative by the Radiologist Assistant, and communication documented in the PACS or [employer].   06/11/2020 PET scan   IMPRESSION: 1. The index nodule within the superior segment of the right lower lobe is FDG avid and worrisome for either primary bronchogenic neoplasm versus metastatic disease 2. Within the anteromedial aspect of the left maxillary sinus, in the region of the nasolacrimal duct, there is a focal soft tissue attenuating filling defect which exhibits intense FDG uptake within SUV max of 55.8. The although focal uptake within the paranasal sinuses may be seen with sinusitis primary neoplasm of the sinuses or nasal lacrimal duct cannot be excluded. In light of the intense FDG uptake in this area further investigation with contrast enhanced CT or MRI of the soft tissues of neck to include the maxillary sinuses is recommended. 3.  Aortic Atherosclerosis (ICD10-I70.0). 4. Coronary artery calcifications 5. Prior granulomatous disease.     07/02/2020 Cancer Staging   Staging form: Lung, AJCC 8th Edition - Clinical stage from 07/02/2020: Stage IA2 (cT1b, cN0, cM0) - Signed by Lanny Callander, MD on 07/06/2020   07/06/2020 Initial Diagnosis   Non-small cell lung cancer, right ([name])   07/30/2020 - 08/08/2020 Radiation Therapy   SBRT to lung 07/16/20-08/04/20 with Dr Dewey.    11/07/2020 Imaging   CT Chest  IMPRESSION: 1. Decreased size of dominant area of nodularity in the RIGHT chest with surrounding post treatment changes. 2. New area of nodularity along the pleural surface along the posterosuperior segment of the RIGHT lower lobe is suspicious but indeterminate at this time. Could consider short interval  follow-up or PET imaging for further assessment as this area would be challenging for tissue sampling. There is also the  possibility this area could somehow related to post treatment changes as well, though the morphologic features do raise suspicion. 3. Stable LEFT adrenal adenoma. 4. Changes of CABG. 5. Aortic atherosclerosis.   Aortic Atherosclerosis (ICD10-I70.0).   09/28/2021 Imaging   EXAM: CT CHEST, ABDOMEN, AND PELVIS WITH CONTRAST  IMPRESSION: 1. Further volume loss/consolidation medially in the superior segment left lower lobe probably related to prior radiation therapy. Underlying original lung nodule not readily distinguishable. 2. Suspected mild atypical infectious bronchiolitis in the lingula. Mild atelectasis in the left lower lobe. 3. Other imaging findings of potential clinical significance: Aortic Atherosclerosis (ICD10-I70.0). Coronary atherosclerosis. Systemic atherosclerosis. Multilevel lumbar impingement. Left adrenal adenoma. Right hemicolectomy.   07/27/2022 Imaging    IMPRESSION: 1. No evidence of recurrent or metastatic disease. 2. Bilateral adrenal adenomas. 3.  Aortic atherosclerosis (ICD10-I70.0). 4.  Emphysema (ICD10-J43.9)      Discussed the use of AI scribe software for clinical note transcription with the patient, who gave verbal consent to proceed.  History of Present Illness Vincent Mcclure is a 89 year old male with a history of lung cancer who presents for follow-up.  He has a history of lung cancer diagnosed in 2021, treated with radiation. A recent CT scan on May 04, 2024, was performed. He also has a history of colon cancer diagnosed six years ago and skin cancer treated with Mohs surgery two to three years ago.  He experiences neuropathy in his legs and takes vitamins B12 and B6. His left leg is larger than the right with swelling that began about two weeks ago. The swelling decreases overnight, and there is no pain upon squeezing. He has not had knee surgery but has a history of falls.     All other systems were reviewed with the patient and are  negative.  MEDICAL HISTORY:  Past Medical History:  Diagnosis Date   Alcohol abuse    Anemia    Arthritis    hands   Blood transfusion without reported diagnosis    CAD (coronary artery disease)    a. NSTEMI 2018 s/p CABG.   CHF (congestive heart failure) ([name])    Chronic low back pain 10/14/2011   colon ca dx'd 05/18/18   colon cancer   Colon polyps    hyperplastic (2004, 2010) and adenomatous (1990).     COLONIC POLYPS, HX OF 03/05/2008   Esophageal stricture    hx of   Gastric ulcer    GERD 03/05/2008   GERD (gastroesophageal reflux disease) 1994   associated peptic strictures   HYPERLIPIDEMIA 03/05/2008   HYPERTENSION 03/05/2008   Hypertension    IBS 03/05/2008   Impaired glucose tolerance 10/12/2011   Iron  deficiency anemia    LEG CRAMPS 09/02/2010   Lung cancer ([name]) dx'd 06/2020   Myocardial infarction [facility])    May 2018   PAD (peripheral artery disease) ([name]) 2013   PAF (paroxysmal atrial fibrillation) ([name])    PERIPHERAL EDEMA 09/02/2010   PERIPHERAL NEUROPATHY 03/05/2008   Premature atrial contraction    Prostatitis    hx of   PVC's (premature ventricular contractions)    Sinus brady-tachy syndrome ([name])    VARICOSE VEINS, LOWER EXTREMITIES 06/09/2009    SURGICAL HISTORY: Past Surgical History:  Procedure Laterality Date   CATARACT EXTRACTION  bilat   COLECTOMY     COLONOSCOPY     CORONARY ARTERY BYPASS  GRAFT N/A 02/15/2017   Procedure: CORONARY ARTERY BYPASS GRAFTING times three  with left internal mammary harvest and endoscopic harvest of Right SVG. Grafts of LIMA to  LAD, SVG to Distal Circ, and to First Diag.;  Surgeon: Army Dallas NOVAK, MD;  Location: [facility] OR;  Service: Open Heart Surgery;  Laterality: N/A;   ESOPHAGOGASTRODUODENOSCOPY N/A 11/14/2014   Procedure: ESOPHAGOGASTRODUODENOSCOPY (EGD);  Surgeon: Gordy CHRISTELLA Starch, MD;  Location: [facility] ENDOSCOPY;  Service: Endoscopy;  Laterality: N/A;   IR RADIOLOGIST EVAL & MGMT  07/04/2018   IR RADIOLOGIST EVAL & MGMT   07/18/2018   LAPAROTOMY N/A 05/19/2018   Procedure: EXPLORATORY LAPAROTOMY RIGHT COLECTOMY;  Surgeon: Gail Favorite, MD;  Location: [facility] ORS;  Service: General;  Laterality: N/A;   LEFT HEART CATH AND CORONARY ANGIOGRAPHY N/A 02/14/2017   Procedure: Left Heart Cath and Coronary Angiography;  Surgeon: Claudene Victory ORN, MD;  Location: [facility] INVASIVE CV LAB;  Service: Cardiovascular;  Laterality: N/A;   LUMBAR SPINE SURGERY  11/2008   Dr Leeann   TEE WITHOUT CARDIOVERSION N/A 02/15/2017   Procedure: TRANSESOPHAGEAL ECHOCARDIOGRAM (TEE);  Surgeon: Army Dallas NOVAK, MD;  Location: [facility] OR;  Service: Open Heart Surgery;  Laterality: N/A;   UPPER GASTROINTESTINAL ENDOSCOPY      I have reviewed the social history and family history with the patient and they are unchanged from previous note.  ALLERGIES:  is allergic to duloxetine .  MEDICATIONS:  Current Outpatient Medications  Medication Sig Dispense Refill   acetaminophen  (TYLENOL ) 500 MG tablet Take 1 tablet (500 mg total) by mouth every 6 (six) hours as needed for mild pain.  0   amLODipine  (NORVASC ) 5 MG tablet Take 1 tablet (5 mg total) by mouth daily. 90 tablet 3   apixaban  (ELIQUIS ) 5 MG TABS tablet Take 1 tablet (5 mg total) by mouth 2 (two) times daily. (Patient not taking: Reported on 12/14/2023) 60 tablet 5   bacitracin 500 UNIT/GM ointment Apply 1 application topically daily as needed for wound care.     Cholecalciferol (VITAMIN D -3 PO) Take 2 capsules by mouth daily.      Folic Acid  5 MG CAPS Take 1 capsule (5 mg total) by mouth daily. 90 capsule 3   hydrochlorothiazide  (HYDRODIURIL ) 25 MG tablet Take 1 tablet by mouth once daily 90 tablet 3   Methylcobalamin (B-12) 5000 MCG TBDP Take 1 tablet by mouth daily. 90 tablet 1   metoprolol  succinate (TOPROL -XL) 25 MG 24 hr tablet TAKE 1 TABLET BY MOUTH ONCE DAILY APPOINTMENT REQUIRED FOR FURTURE REFILLS 90 tablet 2   nitroGLYCERIN  (NITROSTAT ) 0.4 MG SL tablet Place 1 tablet (0.4 mg total) under the tongue  every 5 (five) minutes as needed for chest pain (or tightness). 25 tablet 3   potassium chloride  (KLOR-CON  M) 10 MEQ tablet Take 1 tablet (10 mEq total) by mouth daily. (Patient not taking: Reported on 12/14/2023) 14 tablet 0   pravastatin  (PRAVACHOL ) 40 MG tablet TAKE 1 TABLET BY MOUTH AT BEDTIME 90 tablet 3   Pyridoxine  HCl (B-6) 250 MG TABS Take 1 tablet by mouth daily. 30 tablet 3   silver  sulfADIAZINE  (SILVADENE ) 1 % cream Apply 1 Application topically daily. 50 g 0   No current facility-administered medications for this visit.    PHYSICAL EXAMINATION: ECOG PERFORMANCE STATUS: 1 - Symptomatic but completely ambulatory  Vitals:   05/09/24 1108 05/09/24 1112  BP: (!) 160/82 (!) 160/82  Pulse: (!) 111   Resp: 18   Temp: (!) 97.5 F (36.4 C)  SpO2: 95%    Wt Readings from Last 3 Encounters:  05/09/24 214 lb (97.1 kg)  12/14/23 211 lb (95.7 kg)  11/10/23 210 lb 12.8 oz (95.6 kg)     GENERAL:alert, no distress and comfortable SKIN: skin color, texture, turgor are normal, no rashes or significant lesions EYES: normal, Conjunctiva are pink and non-injected, sclera clear NECK: supple, thyroid  normal size, non-tender, without nodularity LYMPH:  no palpable lymphadenopathy in the cervical, axillary  LUNGS: clear to auscultation and percussion with normal breathing effort HEART: regular rate & rhythm and no murmurs and no lower extremity edema ABDOMEN:abdomen soft, non-tender and normal bowel sounds Musculoskeletal:no cyanosis of digits and no clubbing  NEURO: alert & oriented x 3 with fluent speech, no focal motor/sensory deficits  Physical Exam MEASUREMENTS: Weight- 200.  LABORATORY DATA:  I have reviewed the data as listed    Latest Ref Rng & Units 05/02/2024   10:13 AM 11/03/2023    9:33 AM 06/27/2023   10:50 AM  CBC  WBC 4.0 - 10.5 K/uL 4.2  4.6  4.9   Hemoglobin 13.0 - 17.0 g/dL 85.9  84.8  84.8   Hematocrit 39.0 - 52.0 % 41.2  44.9  44.7   Platelets 150 - 400 K/uL 148   183  192         Latest Ref Rng & Units 05/02/2024   10:26 AM 11/03/2023    9:33 AM 06/27/2023   10:50 AM  CMP  Glucose 70 - 99 mg/dL 866  759  91   BUN 8 - 23 mg/dL 15  18  18    Creatinine 0.61 - 1.24 mg/dL 9.06  8.93  8.91   Sodium 135 - 145 mmol/L 138  135  137   Potassium 3.5 - 5.1 mmol/L 3.6  3.2  4.1   Chloride 98 - 111 mmol/L 101  93  96   CO2 22 - 32 mmol/L 31  32  23   Calcium  8.9 - 10.3 mg/dL 9.5  89.9  9.8   Total Protein 6.5 - 8.1 g/dL 6.7  6.8  6.5   Total Bilirubin 0.0 - 1.2 mg/dL 0.8  0.9  0.6   Alkaline Phos 38 - 126 U/L 87  94  110   AST 15 - 41 U/L 14  16  15    ALT 0 - 44 U/L 11  12  9        RADIOGRAPHIC STUDIES: I have personally reviewed the radiological images as listed and agreed with the findings in the report. No results found.    No orders of the defined types were placed in this encounter.  All questions were answered. The patient knows to call the clinic with any problems, questions or concerns. No barriers to learning was detected. The total time spent in the appointment was 30 minutes, including review of chart and various tests results, discussions about plan of care and coordination of care plan     Onita Mattock, MD 05/09/2024

## 2024-05-09 NOTE — Progress Notes (Signed)
 Verbal order w/readback from Dr. Lanny for Doppler of LLE to r/o DVT to be done by Monday 05/14/2024. Order placed and pt is aware of order.  Pt will call to schedule appt.

## 2024-05-17 ENCOUNTER — Telehealth: Payer: Self-pay

## 2024-05-17 ENCOUNTER — Other Ambulatory Visit: Payer: Self-pay | Admitting: Family Medicine

## 2024-05-17 DIAGNOSIS — I480 Paroxysmal atrial fibrillation: Secondary | ICD-10-CM

## 2024-05-17 DIAGNOSIS — R600 Localized edema: Secondary | ICD-10-CM

## 2024-05-17 DIAGNOSIS — I214 Non-ST elevation (NSTEMI) myocardial infarction: Secondary | ICD-10-CM

## 2024-05-17 DIAGNOSIS — I25118 Atherosclerotic heart disease of native coronary artery with other forms of angina pectoris: Secondary | ICD-10-CM

## 2024-05-17 MED ORDER — NITROGLYCERIN 0.4 MG SL SUBL: 0.4000 mg | SUBLINGUAL_TABLET | SUBLINGUAL | 3 refills | Status: DC | PRN

## 2024-05-17 NOTE — Telephone Encounter (Signed)
 Pt is requesting a refill of nitroGLYCERIN  (NITROSTAT ) 0.4 MG SL tablet going to [facility]. He has miss placed the bottle that he had.

## 2024-05-17 NOTE — Telephone Encounter (Signed)
 Refill sent in

## 2024-05-30 ENCOUNTER — Other Ambulatory Visit: Payer: Self-pay

## 2024-05-30 ENCOUNTER — Emergency Department (HOSPITAL_COMMUNITY)
Admission: EM | Admit: 2024-05-30 | Discharge: 2024-05-30 | Disposition: A | Discharge status: Home / Self Care | Attending: Emergency Medicine | Admitting: Emergency Medicine

## 2024-05-30 ENCOUNTER — Encounter (HOSPITAL_COMMUNITY): Payer: Self-pay

## 2024-05-30 DIAGNOSIS — R040 Epistaxis: Secondary | ICD-10-CM | POA: Insufficient documentation

## 2024-05-30 DIAGNOSIS — Z7901 Long term (current) use of anticoagulants: Secondary | ICD-10-CM | POA: Insufficient documentation

## 2024-05-30 DIAGNOSIS — R059 Cough, unspecified: Secondary | ICD-10-CM | POA: Insufficient documentation

## 2024-05-30 DIAGNOSIS — I2510 Atherosclerotic heart disease of native coronary artery without angina pectoris: Secondary | ICD-10-CM | POA: Diagnosis not present

## 2024-05-30 DIAGNOSIS — Z7982 Long term (current) use of aspirin: Secondary | ICD-10-CM | POA: Insufficient documentation

## 2024-05-30 DIAGNOSIS — R58 Hemorrhage, not elsewhere classified: Secondary | ICD-10-CM | POA: Diagnosis not present

## 2024-05-30 DIAGNOSIS — I480 Paroxysmal atrial fibrillation: Secondary | ICD-10-CM | POA: Insufficient documentation

## 2024-05-30 LAB — CBC WITH DIFFERENTIAL/PLATELET
Abs Immature Granulocytes: 0.02 K/uL (ref 0.00–0.07)
Basophils Absolute: 0 K/uL (ref 0.0–0.1)
Basophils Relative: 0 %
Eosinophils Absolute: 0.1 K/uL (ref 0.0–0.5)
Eosinophils Relative: 1 %
HCT: 40.4 % (ref 39.0–52.0)
Hemoglobin: 13.3 g/dL (ref 13.0–17.0)
Immature Granulocytes: 0 %
Lymphocytes Relative: 9 %
Lymphs Abs: 0.5 K/uL — ABNORMAL LOW (ref 0.7–4.0)
MCH: 29.5 pg (ref 26.0–34.0)
MCHC: 32.9 g/dL (ref 30.0–36.0)
MCV: 89.6 fL (ref 80.0–100.0)
Monocytes Absolute: 0.5 K/uL (ref 0.1–1.0)
Monocytes Relative: 9 %
Neutro Abs: 4.5 K/uL (ref 1.7–7.7)
Neutrophils Relative %: 81 %
Platelets: 187 K/uL (ref 150–400)
RBC: 4.51 MIL/uL (ref 4.22–5.81)
RDW: 13.5 % (ref 11.5–15.5)
WBC: 5.6 K/uL (ref 4.0–10.5)
nRBC: 0 % (ref 0.0–0.2)

## 2024-05-30 MED ORDER — OXYMETAZOLINE HCL 0.05 % NA SOLN: 1.0000 | Freq: Once | NASAL | Status: DC

## 2024-05-30 NOTE — Discharge Instructions (Signed)
 As discussed, if you have additional episodes of nosebleeds hold pressure as close to your face as you can on your nose right where you feel the end of the nasal bones.  Hold pressure here for 10 minutes.  You can always return to the emergency room if you have continued bleeding after this or call EMS as they are often able to stop nosebleeds.

## 2024-05-30 NOTE — ED Triage Notes (Signed)
 Patient BIB [facility] from home due to a nose bleed. Patient states that nose was bleeding 45 minutes prior to EMS arrival. EMS gave 2 sprays of afrin and bleeding stopped 10-15 after. Patient coughing blood which he thinks is likely due to swallowing some blood. VSS A&Ox4.

## 2024-05-30 NOTE — ED Provider Notes (Signed)
 [place] EMERGENCY DEPARTMENT AT [facility] Provider Note   CSN: [id number] Arrival date & time: 05/30/24  9442     Patient presents with: Epistaxis   Vincent Mcclure is a 89 y.o. male.    Epistaxis  Patient is a 89 year old male with a past medical history significant for CAD, paroxysmal A-fib on aspirin   He presents emergency room today with complaints of left-sided epistaxis that occurred this morning when he first woke up.  Seems that he held pressure but was unable to stop the bleeding on his own EMS administered 2 sprays of Afrin and applied pressure and transferred patient to emergency department.  He is no longer bleeding he denies any difficulty breathing nausea or vomiting.  He has had small amount of blood with coughing since the epistaxis but this has since cleared.  Denies any chest pain or difficulty breathing.  No symptoms currently.     Prior to Admission medications   Medication Sig Start Date End Date Taking? Authorizing Provider  acetaminophen  (TYLENOL ) 500 MG tablet Take 1 tablet (500 mg total) by mouth every 6 (six) hours as needed for mild pain. 05/30/18   Vicci Burnard SAUNDERS, PA-C  amLODipine  (NORVASC ) 5 MG tablet Take 1 tablet (5 mg total) by mouth daily. 06/23/23   Chandra Toribio POUR, MD  apixaban  (ELIQUIS ) 5 MG TABS tablet Take 1 tablet (5 mg total) by mouth 2 (two) times daily. Patient not taking: Reported on 12/14/2023 07/21/23   Chandra Toribio POUR, MD  bacitracin 500 UNIT/GM ointment Apply 1 application topically daily as needed for wound care.    [provider]  Cholecalciferol (VITAMIN D -3 PO) Take 2 capsules by mouth daily.     [provider]  Folic Acid  5 MG CAPS Take 1 capsule (5 mg total) by mouth daily. 07/21/23   Chandra Toribio POUR, MD  hydrochlorothiazide  (HYDRODIURIL ) 25 MG tablet Take 1 tablet by mouth once daily 02/23/24   Dunn, Dayna N, PA-C  Methylcobalamin (B-12) 5000 MCG TBDP Take 1 tablet by mouth daily. 07/01/23   Chandra Toribio POUR, MD  metoprolol  succinate (TOPROL -XL) 25 MG 24 hr tablet TAKE 1 TABLET BY MOUTH ONCE DAILY APPOINTMENT REQUIRED FOR FURTURE REFILLS 04/26/24   Delford Maude BROCKS, MD  nitroGLYCERIN  (NITROSTAT ) 0.4 MG SL tablet Place 1 tablet (0.4 mg total) under the tongue every 5 (five) minutes as needed for chest pain (or tightness). 05/17/24   Chandra Toribio POUR, MD  potassium chloride  (KLOR-CON  M) 10 MEQ tablet Take 1 tablet (10 mEq total) by mouth daily. Patient not taking: Reported on 12/14/2023 11/10/23   Lanny Callander, MD  pravastatin  (PRAVACHOL ) 40 MG tablet TAKE 1 TABLET BY MOUTH AT BEDTIME 01/16/24   Nishan, Peter C, MD  Pyridoxine  HCl (B-6) 250 MG TABS Take 1 tablet by mouth daily. 08/18/23   Chandra Toribio POUR, MD  silver  sulfADIAZINE  (SILVADENE ) 1 % cream Apply 1 Application topically daily. 05/03/22   Abonza, Maritza, PA-C    Allergies: Duloxetine     Review of Systems  HENT:  Positive for nosebleeds.     Updated Vital Signs BP 118/71   Pulse 88   Temp 97.7 F (36.5 C) (Oral)   Resp 20   Ht 6' (1.829 m)   Wt 93.9 kg   SpO2 98%   BMI 28.07 kg/m   Physical Exam Vitals and nursing note reviewed.  Constitutional:      General: He is not in acute distress.    Appearance: Normal appearance.  He is not ill-appearing.  HENT:     Head: Normocephalic and atraumatic.     Nose:     Comments: Nose midline, no evidence of trauma, internal exam with normal mucosa.  No excoriations or wounds present.  There is dried blood around the left nare    Mouth/Throat:     Mouth: Mucous membranes are moist.  Eyes:     General: No scleral icterus.       Right eye: No discharge.        Left eye: No discharge.     Conjunctiva/sclera: Conjunctivae normal.  Pulmonary:     Effort: Pulmonary effort is normal.     Breath sounds: No stridor.     Comments: Lungs clear to auscultation, no wheezing or crackles speaking in full sentences Neurological:     Mental Status: He is alert and oriented to person, place, and time.  Mental status is at baseline.     (all labs ordered are listed, but only abnormal results are displayed) Labs Reviewed  CBC WITH DIFFERENTIAL/PLATELET - Abnormal; Notable for the following components:      Result Value   Lymphs Abs 0.5 (*)    All other components within normal limits    EKG: None  Radiology: No results found.   Procedures   Medications Ordered in the ED - No data to display                                  Medical Decision Making  Patient is a 89 year old male with a past medical history significant for CAD, paroxysmal A-fib on aspirin   He presents emergency room today with complaints of left-sided epistaxis that occurred this morning when he first woke up.  Seems that he held pressure but was unable to stop the bleeding on his own EMS administered 2 sprays of Afrin and applied pressure and transferred patient to emergency department.  He is no longer bleeding he denies any difficulty breathing nausea or vomiting.  He has had small amount of blood with coughing since the epistaxis but this has since cleared.  Denies any chest pain or difficulty breathing.  No symptoms currently.  CBC with normal hemoglobin.  Will discharge home at this time.  Vital signs normal, no bleeding here in the emergency department.  He has been observed for an hour without recurrence of nosebleed.   Final diagnoses:  Epistaxis    ED Discharge Orders     None          Neldon Hamp RAMAN, [place] 05/30/24 [id number]    Dean Clarity, MD 05/30/24 [phone]

## 2024-05-31 ENCOUNTER — Other Ambulatory Visit: Payer: Self-pay

## 2024-05-31 ENCOUNTER — Emergency Department (HOSPITAL_COMMUNITY)
Admission: EM | Admit: 2024-05-31 | Discharge: 2024-05-31 | Disposition: A | Discharge status: Home / Self Care | Attending: Emergency Medicine | Admitting: Emergency Medicine

## 2024-05-31 ENCOUNTER — Encounter (HOSPITAL_COMMUNITY): Payer: Self-pay | Admitting: Emergency Medicine

## 2024-05-31 DIAGNOSIS — Z7901 Long term (current) use of anticoagulants: Secondary | ICD-10-CM | POA: Insufficient documentation

## 2024-05-31 DIAGNOSIS — R040 Epistaxis: Secondary | ICD-10-CM | POA: Diagnosis not present

## 2024-05-31 DIAGNOSIS — I10 Essential (primary) hypertension: Secondary | ICD-10-CM | POA: Diagnosis not present

## 2024-05-31 MED ORDER — LIDOCAINE HCL 4 % EX SOLN
1.0000 mL | Freq: Once | CUTANEOUS | Status: AC
  Administered 2024-05-31: 1 mL via TOPICAL
  Filled 2024-05-31: qty 50

## 2024-05-31 MED ORDER — SILVER SULFADIAZINE 1 % EX CREA: TOPICAL_CREAM | Freq: Once | CUTANEOUS | Status: DC

## 2024-05-31 MED ORDER — SILVER NITRATE-POT NITRATE 75-25 % EX MISC
5.0000 | Freq: Once | CUTANEOUS | Status: AC
  Administered 2024-05-31: 5 via TOPICAL
  Filled 2024-05-31: qty 5

## 2024-05-31 NOTE — ED Triage Notes (Signed)
 Pt coming in for nosebleed. Pt currently has no nosebleed

## 2024-05-31 NOTE — ED Provider Notes (Signed)
 [place] EMERGENCY DEPARTMENT AT [facility] Provider Note   CSN: [id number] Arrival date & time: 05/31/24  9781     Patient presents with: Epistaxis   Vincent Mcclure is a 89 y.o. male.   Returns to the emergency department for recurrent nosebleed.  Was seen in the ED yesterday for bleeding from the left side of his nose.  He was treated with Afrin and pressure by EMS and at the time he arrived at the ED, was no longer bleeding.  No intervention was performed.  Patient reports that tonight the bleeding started again.  At time of arrival to the emergency department, however, bleeding has stopped.       Prior to Admission medications   Medication Sig Start Date End Date Taking? Authorizing Provider  acetaminophen  (TYLENOL ) 500 MG tablet Take 1 tablet (500 mg total) by mouth every 6 (six) hours as needed for mild pain. 05/30/18   Vicci Burnard SAUNDERS, PA-C  amLODipine  (NORVASC ) 5 MG tablet Take 1 tablet (5 mg total) by mouth daily. 06/23/23   Chandra Toribio POUR, MD  apixaban  (ELIQUIS ) 5 MG TABS tablet Take 1 tablet (5 mg total) by mouth 2 (two) times daily. Patient not taking: Reported on 12/14/2023 07/21/23   Chandra Toribio POUR, MD  bacitracin 500 UNIT/GM ointment Apply 1 application topically daily as needed for wound care.    [provider]  Cholecalciferol (VITAMIN D -3 PO) Take 2 capsules by mouth daily.     [provider]  Folic Acid  5 MG CAPS Take 1 capsule (5 mg total) by mouth daily. 07/21/23   Chandra Toribio POUR, MD  hydrochlorothiazide  (HYDRODIURIL ) 25 MG tablet Take 1 tablet by mouth once daily 02/23/24   Dunn, Dayna N, PA-C  Methylcobalamin (B-12) 5000 MCG TBDP Take 1 tablet by mouth daily. 07/01/23   Chandra Toribio POUR, MD  metoprolol  succinate (TOPROL -XL) 25 MG 24 hr tablet TAKE 1 TABLET BY MOUTH ONCE DAILY APPOINTMENT REQUIRED FOR FURTURE REFILLS 04/26/24   Delford Maude BROCKS, MD  nitroGLYCERIN  (NITROSTAT ) 0.4 MG SL tablet Place 1 tablet (0.4 mg total) under the  tongue every 5 (five) minutes as needed for chest pain (or tightness). 05/17/24   Chandra Toribio POUR, MD  potassium chloride  (KLOR-CON  M) 10 MEQ tablet Take 1 tablet (10 mEq total) by mouth daily. Patient not taking: Reported on 12/14/2023 11/10/23   Lanny Callander, MD  pravastatin  (PRAVACHOL ) 40 MG tablet TAKE 1 TABLET BY MOUTH AT BEDTIME 01/16/24   Nishan, Peter C, MD  Pyridoxine  HCl (B-6) 250 MG TABS Take 1 tablet by mouth daily. 08/18/23   Chandra Toribio POUR, MD  silver  sulfADIAZINE  (SILVADENE ) 1 % cream Apply 1 Application topically daily. 05/03/22   Abonza, Maritza, PA-C    Allergies: Duloxetine     Review of Systems  Updated Vital Signs BP (!) 154/78 (BP Location: Left Arm)   Pulse 98   Temp (!) 97.5 F (36.4 C) (Oral)   Resp 20   SpO2 100%   Physical Exam Vitals and nursing note reviewed.  Constitutional:      General: He is not in acute distress.    Appearance: He is well-developed.  HENT:     Head: Normocephalic and atraumatic.     Nose: Mucosal edema present.     Comments: Edema, sequela of recent bleed left septum, no active bleeding    Mouth/Throat:     Mouth: Mucous membranes are moist.  Eyes:     General: Vision grossly intact. Gaze  aligned appropriately.     Extraocular Movements: Extraocular movements intact.     Conjunctiva/sclera: Conjunctivae normal.  Cardiovascular:     Rate and Rhythm: Normal rate and regular rhythm.     Pulses: Normal pulses.     Heart sounds: Normal heart sounds, S1 normal and S2 normal. No murmur heard.    No friction rub. No gallop.  Pulmonary:     Effort: Pulmonary effort is normal. No respiratory distress.     Breath sounds: Normal breath sounds.  Abdominal:     Palpations: Abdomen is soft.     Tenderness: There is no abdominal tenderness. There is no guarding or rebound.     Hernia: No hernia is present.  Musculoskeletal:        General: No swelling.     Cervical back: Full passive range of motion without pain, normal range of motion and neck  supple. No pain with movement, spinous process tenderness or muscular tenderness. Normal range of motion.     Right lower leg: No edema.     Left lower leg: No edema.  Skin:    General: Skin is warm and dry.     Capillary Refill: Capillary refill takes less than 2 seconds.     Findings: No ecchymosis, erythema, lesion or wound.  Neurological:     Mental Status: He is alert and oriented to person, place, and time.     GCS: GCS eye subscore is 4. GCS verbal subscore is 5. GCS motor subscore is 6.     Cranial Nerves: Cranial nerves 2-12 are intact.     Sensory: Sensation is intact.     Motor: Motor function is intact. No weakness or abnormal muscle tone.     Coordination: Coordination is intact.  Psychiatric:        Mood and Affect: Mood normal.        Speech: Speech normal.        Behavior: Behavior normal.     (all labs ordered are listed, but only abnormal results are displayed) Labs Reviewed - No data to display  EKG: None  Radiology: No results found.   Epistaxis Management  Date/Time: 05/31/2024 4:11 AM  Performed by: Haze Lonni PARAS, MD Authorized by: Haze Lonni PARAS, MD   Consent:    Consent obtained:  Verbal   Consent given by:  Patient   Risks, benefits, and alternatives were discussed: yes     Risks discussed:  Bleeding, nasal injury and pain   Alternatives discussed:  Delayed treatment Universal protocol:    Procedure explained and questions answered to patient or proxy's satisfaction: yes     Relevant documents present and verified: yes     Test results available: yes     Imaging studies available: yes     Required blood products, implants, devices, and special equipment available: yes     Site/side marked: yes     Immediately prior to procedure, a time out was called: yes     Patient identity confirmed:  Verbally with patient Anesthesia:    Anesthesia method:  Topical application   Topical anesthetic:  Lidocaine  gel Procedure details:     Treatment site:  L anterior   Treatment method:  Silver  nitrate   Treatment complexity:  Limited   Treatment episode: recurring   Post-procedure details:    Assessment:  Bleeding stopped   Procedure completion:  Tolerated well, no immediate complications    Medications Ordered in the ED  lidocaine  (XYLOCAINE ) 4 %  external solution 1 mL (1 mL Topical Given 05/31/24 0249)  silver  nitrate applicators applicator 5 Stick (5 Sticks Topical Given 05/31/24 0249)                                    Medical Decision Making Risk Prescription drug management.   Presents for nosebleed.  This is the second time in 2 days he has had a nosebleed.  Was seen in the ED yesterday but bleeding had stopped, no interventions performed.  He does have sequela of anterior bleed on the left.  No active bleeding.  This area was cauterized and he was monitored.  No further bleeding.     Final diagnoses:  Left-sided epistaxis    ED Discharge Orders     None          Haze Lonni PARAS, MD 05/31/24 [phone]

## 2024-06-06 ENCOUNTER — Telehealth: Payer: Self-pay

## 2024-06-06 NOTE — Telephone Encounter (Signed)
 Pt stopped in yesterday to get a ED FU for nose bleeds. Pt reports that he made changes to the medications (On His Own) and is currently ONLY taking  acetaminophen  (TYLENOL ) 500 MG tablet  Methylcobalamin (B-12) 5000 MCG TBDP  Pyridoxine  HCl (B-6) 250 MG TABS  metoprolol  succinate (TOPROL -XL) 25 MG 24 hr tablet  pravastatin  (PRAVACHOL ) 40 MG tablet   Pt will be here tomorrow for the ED FU.

## 2024-06-07 ENCOUNTER — Encounter: Payer: Self-pay | Admitting: Family Medicine

## 2024-06-07 ENCOUNTER — Ambulatory Visit (INDEPENDENT_AMBULATORY_CARE_PROVIDER_SITE_OTHER): Admitting: Family Medicine

## 2024-06-07 VITALS — BP 170/101 | HR 81 | Ht 72.0 in | Wt 210.1 lb

## 2024-06-07 DIAGNOSIS — R600 Localized edema: Secondary | ICD-10-CM | POA: Diagnosis not present

## 2024-06-07 DIAGNOSIS — I10 Essential (primary) hypertension: Secondary | ICD-10-CM | POA: Diagnosis not present

## 2024-06-07 DIAGNOSIS — I480 Paroxysmal atrial fibrillation: Secondary | ICD-10-CM | POA: Diagnosis not present

## 2024-06-07 MED ORDER — SPIRONOLACTONE 25 MG PO TABS: 25.0000 mg | ORAL_TABLET | Freq: Every day | ORAL | 3 refills | Status: AC

## 2024-06-07 NOTE — Patient Instructions (Signed)
 It was nice to see you today,  We addressed the following topics today: -I am starting a new medication for your blood pressure called spironolactone . Take this once a day - I will message your cardiologist about your medication changes.  I will ask them if there is anything they can do to help with the cost of Eliquis  - I would like you to get the ultrasound that was ordered by Dr. Lanny  Have a great day,  Rolan Slain, MD

## 2024-06-07 NOTE — Assessment & Plan Note (Addendum)
 The pt Recently stopped taking amlodipine  and hydrochlorothiazide  due to side effects. Current BP is elevated at 168/100. Does not check BP at home. Discussed risks of uncontrolled HTN, including stroke and heart attack. - Start spironolactone . - f/u nurse visit in 1 month to recheck bp.   - will notify pt's cardiologist regarding changes pt made.

## 2024-06-07 NOTE — Assessment & Plan Note (Signed)
 Left Leg Edema: Worsening left leg swelling. oncology recommended a lower extremity ultrasound to evaluate for DVT, which has been ordered but not scheduled. This is of increased concern given the recent cessation of anticoagulation. - Strongly recommended proceeding with the venous doppler ultrasound of the lower extremities. - pt agreeable to scheduling the ultrasound

## 2024-06-07 NOTE — Progress Notes (Signed)
 Established Patient Office Visit  Subjective   Patient ID: Vincent Mcclure, male    DOB: 1931-12-21  Age: 89 y.o. MRN: [id number]  Chief Complaint  Patient presents with   Hospitalization Follow-up    HPI  Subjective - Reports recent history of epistaxis, occurring two days in a row. Was treated with cauterization on the second day, which was successful. Following this, passed blood clots from the sinuses for two days, with some residual black specks. Also notes dark stools which cleared after approximately seven days. - Reports stopping several medications one week ago (last Wednesday) due to feeling weak and generally unwell. Reports feeling better since stopping the medications. - Reports new and worsening left leg swelling, which is greater than the right. Also reports associated hip pain. Was advised by oncologist at last visit to get an ultrasound to rule out DVT, but this has not yet been scheduled.  Medications: Currently taking metoprolol , pravastatin , vitamin B6, and vitamin B12. Takes Tylenol  PRN. Stopped taking amlodipine , hydrochlorothiazide , and Eliquis  last week. Reports previously taking baby aspirin  but also stopped this last week.  PMH, PSH, FH, Social Hx: PMHx: Hypertension, atrial fibrillation, hyperlipidemia. History of colon cancer and lung cancer, now reportedly clear per GYN oncology (Dr. Deward) on 05/09/2024. Father had heart problems. PSH: Heart operation in 02/2017. Colon cancer surgery in 2019. Social Hx: Reports decreased alcohol intake to approximately one beer daily and 1.5 glasses of wine over the past week. Veteran (former Technical sales engineer), has had difficulty with [employer] services.  ROS: Constitutional: Denies feeling weak since stopping medications. HEENT: Positive for recent epistaxis, now resolved. GI: Positive for recent melena, now resolved. Musculoskeletal: Positive for leg swelling, worse on the left. Positive for hip pain.    The ASCVD Risk score (Arnett  DK, et al., 2019) failed to calculate for the following reasons:   The 2019 ASCVD risk score is only valid for ages 57 to 80   Risk score cannot be calculated because patient has a medical history suggesting prior/existing ASCVD  Health Maintenance Due  Topic Date Due   Zoster Vaccines- Shingrix (1 of 2) Never done   DTaP/Tdap/Td (2 - Tdap) 06/10/2019   Colonoscopy  06/10/2022   COVID-19 Vaccine (4 - 2024-25 season) 06/12/2023      Objective:     BP (!) 170/101   Pulse 81   Ht 6' (1.829 m)   Wt 210 lb 1.9 oz (95.3 kg)   SpO2 98%   BMI 28.50 kg/m    Physical Exam Gen: alert, oriented Heent: perrla, eomi.  Decreased hearing.  Ext: b/l pitting edema, left greater than right.    No results found for any visits on 06/07/24.      Assessment & Plan:   Primary hypertension Assessment & Plan: The pt Recently stopped taking amlodipine  and hydrochlorothiazide  due to side effects. Current BP is elevated at 168/100. Does not check BP at home. Discussed risks of uncontrolled HTN, including stroke and heart attack. - Start spironolactone . - f/u nurse visit in 1 month to recheck bp.   - will notify pt's cardiologist regarding changes pt made.    Paroxysmal atrial fibrillation [facility]) Assessment & Plan: History of atrial fibrillation. Stopped Eliquis  due to cost ($297/month). Was previously taking half a tablet BID to reduce cost. Discussed the risk of stroke without anticoagulation versus the risk of bleeding with anticoagulation. Acknowledges understanding of the risks. - Will send a message to cardiology (Dr. Delford) regarding Eliquis  cost and non-adherence to see  if samples or financial assistance are available from their office. - Patient advised to call Dr. Claiborne office as well.   Peripheral edema Assessment & Plan: Left Leg Edema: Worsening left leg swelling. oncology recommended a lower extremity ultrasound to evaluate for DVT, which has been ordered but not scheduled.  This is of increased concern given the recent cessation of anticoagulation. - Strongly recommended proceeding with the venous doppler ultrasound of the lower extremities. - pt agreeable to scheduling the ultrasound   Other orders -     Spironolactone ; Take 1 tablet (25 mg total) by mouth daily.  Dispense: 90 tablet; Refill: 3     Return in about 3 months (around 09/07/2024) for HTN.    Vincent MARLA Slain, MD

## 2024-06-07 NOTE — Assessment & Plan Note (Signed)
 History of atrial fibrillation. Stopped Eliquis  due to cost ($297/month). Was previously taking half a tablet BID to reduce cost. Discussed the risk of stroke without anticoagulation versus the risk of bleeding with anticoagulation. Acknowledges understanding of the risks. - Will send a message to cardiology (Dr. Delford) regarding Eliquis  cost and non-adherence to see if samples or financial assistance are available from their office. - Patient advised to call Dr. Claiborne office as well.

## 2024-06-08 NOTE — Progress Notes (Signed)
 Can you let the patient know that the higher price for his eliquis  he only has to pay once and after that the copay is $47 and let me know if that is affordable for him.

## 2024-07-09 ENCOUNTER — Ambulatory Visit (INDEPENDENT_AMBULATORY_CARE_PROVIDER_SITE_OTHER): Admitting: Family Medicine

## 2024-07-09 VITALS — BP 134/85 | HR 82 | Ht 72.0 in | Wt 210.0 lb

## 2024-07-09 DIAGNOSIS — I10 Essential (primary) hypertension: Secondary | ICD-10-CM | POA: Diagnosis not present

## 2024-07-09 NOTE — Progress Notes (Signed)
 Patient is here for a recheck of his BP

## 2024-09-10 ENCOUNTER — Ambulatory Visit: Admitting: Family Medicine

## 2024-09-10 ENCOUNTER — Encounter: Payer: Self-pay | Admitting: Family Medicine

## 2024-09-10 VITALS — BP 153/89 | HR 89 | Ht 72.0 in | Wt 201.1 lb

## 2024-09-10 DIAGNOSIS — R252 Cramp and spasm: Secondary | ICD-10-CM | POA: Diagnosis not present

## 2024-09-10 DIAGNOSIS — R296 Repeated falls: Secondary | ICD-10-CM | POA: Insufficient documentation

## 2024-09-10 DIAGNOSIS — I25119 Atherosclerotic heart disease of native coronary artery with unspecified angina pectoris: Secondary | ICD-10-CM | POA: Diagnosis not present

## 2024-09-10 MED ORDER — MAGNESIUM OXIDE 400 MG PO TABS: 400.0000 mg | ORAL_TABLET | Freq: Every day | ORAL | 2 refills | Status: AC

## 2024-09-10 MED ORDER — ISOSORBIDE MONONITRATE ER 30 MG PO TB24: 15.0000 mg | ORAL_TABLET | Freq: Every day | ORAL | 1 refills | Status: AC

## 2024-09-10 NOTE — Patient Instructions (Signed)
 It was nice to see you today,  We addressed the following topics today: - I would like you to start taking a new medication called isosorbide  mononitrate, also known as Imdur . Please take half of one tablet once a day. This is to help prevent your chest pain. - Please continue to use your nitroglycerin  spray or tablet under the tongue if you do get chest pain. - I would like you to try taking magnesium  oxide, which you can get over the counter. Take this at night to see if it helps with your muscle cramps. - Please continue taking your hydrochlorothiazide  for your blood pressure. - For exercise, please practice standing up and sitting down from a sturdy, stable chair. Do not use a rolling chair for this, as it is not safe.  Have a great day,  Rolan Slain, MD

## 2024-09-10 NOTE — Assessment & Plan Note (Signed)
 Experiencing recurrent chest pain at rest, approximately three episodes in the last two weeks, relieved by nitroglycerin . The pain radiates across the chest and down both arms. He has an upcoming cardiology appointment in the next 30 days. - Start isosorbide  mononitrate (Imdur ) 15 mg (half of a 30 mg tablet) once daily to help prevent anginal episodes. - Continue using sublingual nitroglycerin  as needed for acute chest pain. - Follow up with cardiology as scheduled.

## 2024-09-10 NOTE — Assessment & Plan Note (Signed)
 Reports approximately six falls in the past year. Attributes falls to tripping, loss of balance, and leg cramps. Reports generalized weakness, though denies pain with ambulation. Patient is resistant to physical therapy, stating he has not found it helpful in the past. - Discussed balance and strengthening exercises, including sit-to-stand practice using a stable chair. - Advised against using a rolling chair for exercises due to instability. - Continue to encourage safe ambulation and home exercises.

## 2024-09-10 NOTE — Assessment & Plan Note (Signed)
 Reports frequent cramps in legs (3-4 times/week) and posterior neck/head (1-2 times/week). Leg cramps have contributed to falls. This may be exacerbated by hydrochlorothiazide  use. - Recommend over-the-counter magnesium  oxide at bedtime. - Continue hydrochlorothiazide  for blood pressure management.

## 2024-09-10 NOTE — Progress Notes (Signed)
 Established Patient Office Visit  Subjective   Patient ID: Vincent Mcclure, male    DOB: [birth year]  Age: 89 y.o. MRN: [id number]  Chief Complaint  Patient presents with   Medical Management of Chronic Issues    HPI  Subjective - Reports multiple falls in the last year, approximately six times. Some falls due to tripping, others from loss of balance. One fall was preceded by a left foot cramp causing the foot to turn inward. - Complains of leg cramps 3-4 times per week, occurring at any time but mostly at night. Describes them as pretty serious. - Experiences cramps in the back of the head, mostly on the left side, once or twice a week. Describes it as a sensation he hopes is a muscle cramp and rubs the area downwards for relief. - Reports three episodes of chest pain in the last two weeks, occurring at night. Describes the pain as horrible, located across the chest and radiating down both arms to the wrists. Took nitroglycerin  which provided relief. One episode woke him at 1:00 AM; he took one tablet at 3:02 AM and a second at 3:07 AM, with pain resolving around 3:07 AM. - Reports feeling weak, particularly in the legs. Denies leg pain with walking but feels weak. Practices standing and sitting from a chair for exercise.  Medications Currently taking metoprolol , hydrochlorothiazide , a statin at night, vitamin D3, and an AREDS formula. Recently restarted hydrochlorothiazide  about two weeks ago after stopping it for a period. Used sublingual nitroglycerin  three times in the last two weeks for chest pain with good effect. Patient does not recall taking Imdur  in the past.  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 Reports weight loss from 228-230 lbs down to 194 lbs through dietary changes. PSH: Colon cancer surgery. FH: Mother died at age 67. Father died  about four years later. Father had a stroke at home. 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 Walks at [facility] for exercise. Denies regrets in life.  ROS Pertinent positives: falls, leg cramps, neck/head cramps, chest pain radiating to arms. General weakness. Pertinent negatives: denies leg pain with walking.     The ASCVD Risk score (Arnett DK, et al., 2019) failed to calculate for the following reasons:   The 2019 ASCVD risk score is only valid for ages 80 to 95   Risk score cannot be calculated because patient has a medical history suggesting prior/existing ASCVD  Health Maintenance Due  Topic Date Due   Pneumococcal Vaccine: 50+ Years (1 of 2 - PCV) Never done   Zoster Vaccines- Shingrix (1 of 2) Never done   DTaP/Tdap/Td (2 - Tdap) 06/10/2019   Colonoscopy  06/10/2022   COVID-19 Vaccine (4 - 2025-26 season) 06/11/2024   Medicare Annual Wellness (AWV)  08/10/2024      Objective:     BP (!) 153/89   Pulse 89   Ht 6' (1.829 m)   Wt 201 lb 1.9 oz (91.2 kg)   SpO2 98%   BMI 27.28 kg/m    Physical Exam Gen: alert, oriented. Difficulty hearing Pulm: no respiratory distress Psych: pleasant affect   No results found for any visits on 09/10/24.      Assessment & Plan:   Coronary artery disease  involving native heart with angina pectoris, unspecified vessel or lesion type Assessment & Plan: Experiencing recurrent chest pain at rest, approximately three episodes in the last two weeks, relieved by nitroglycerin . The pain radiates across the chest and down both arms. He has an upcoming cardiology appointment in the next 30 days. - Start isosorbide  mononitrate (Imdur ) 15 mg (half of a 30 mg tablet) once daily to help prevent anginal episodes. - Continue using sublingual nitroglycerin  as needed for acute chest pain. - Follow up with cardiology as  scheduled.   Muscle cramp  Recurrent falls Assessment & Plan: Reports approximately six falls in the past year. Attributes falls to tripping, loss of balance, and leg cramps. Reports generalized weakness, though denies pain with ambulation. Patient is resistant to physical therapy, stating he has not found it helpful in the past. - Discussed balance and strengthening exercises, including sit-to-stand practice using a stable chair. - Advised against using a rolling chair for exercises due to instability. - Continue to encourage safe ambulation and home exercises.   LEG CRAMPS Assessment & Plan: Reports frequent cramps in legs (3-4 times/week) and posterior neck/head (1-2 times/week). Leg cramps have contributed to falls. This may be exacerbated by hydrochlorothiazide  use. - Recommend over-the-counter magnesium  oxide at bedtime. - Continue hydrochlorothiazide  for blood pressure management.   Other orders -     Isosorbide  Mononitrate ER; Take 0.5 tablets (15 mg total) by mouth daily.  Dispense: 90 tablet; Refill: 1 -     Magnesium  Oxide; Take 1 tablet (400 mg total) by mouth at bedtime.  Dispense: 30 tablet; Refill: 2     Return in about 3 months (around 12/09/2024) for angina, htn, cramps.    Toribio MARLA Slain, MD

## 2024-09-14 ENCOUNTER — Encounter (HOSPITAL_COMMUNITY): Payer: Self-pay

## 2024-09-14 ENCOUNTER — Emergency Department (HOSPITAL_COMMUNITY)
Admission: EM | Admit: 2024-09-14 | Discharge: 2024-09-14 | Disposition: A | Discharge status: Home / Self Care | Attending: Emergency Medicine | Admitting: Emergency Medicine

## 2024-09-14 DIAGNOSIS — Z7901 Long term (current) use of anticoagulants: Secondary | ICD-10-CM | POA: Diagnosis not present

## 2024-09-14 DIAGNOSIS — R040 Epistaxis: Secondary | ICD-10-CM | POA: Diagnosis not present

## 2024-09-14 MED ORDER — SILVER NITRATE-POT NITRATE 75-25 % EX MISC
1.0000 | Freq: Once | CUTANEOUS | Status: AC
  Administered 2024-09-14: 1 via TOPICAL
  Filled 2024-09-14: qty 1

## 2024-09-14 MED ORDER — LIDOCAINE-EPINEPHRINE (PF) 2 %-1:200000 IJ SOLN
10.0000 mL | Freq: Once | INTRAMUSCULAR | Status: AC
  Administered 2024-09-14: 10 mL via INTRADERMAL
  Filled 2024-09-14: qty 20

## 2024-09-14 MED ORDER — OXYMETAZOLINE HCL 0.05 % NA SOLN
1.0000 | Freq: Once | NASAL | Status: AC
  Administered 2024-09-14: 1 via NASAL
  Filled 2024-09-14: qty 30

## 2024-09-14 NOTE — Discharge Instructions (Signed)
 You can try humidifier in your room.  Place a small amount of antibiotic ointment on the tip of your small finger and rub it just inside the nostril twice a day.  Try to avoid rubbing or scratching your nose.  If you do have bleeding again please hold direct pressure for 15 minutes without stopping.  If this does not stop the bleeding then please return to the emergency department for evaluation.  I did give you information to follow-up with ENT.  You can call them on Monday and try and set up an appointment.

## 2024-09-14 NOTE — ED Triage Notes (Signed)
 BIB [facility] from home w/ c/o persistent epistaxis for approx. 30 min pta. Per EMS pt started IMDUR  and Alcatone Tuesday and has had intermittent nose bleeds since. Pt stopped taking eliquis  2 weeks. EMS administered 2 afrin in route. Per EMS ETOH on board.

## 2024-09-14 NOTE — ED Provider Notes (Signed)
 [place] EMERGENCY DEPARTMENT AT [facility] Provider Note   CSN: [id number] Arrival date & time: 09/14/24  2100     Patient presents with: Epistaxis   Vincent Mcclure is a 89 y.o. male.   [name] with a chief complaints of epistaxis.  Coming from the left side.  Started about 30 minutes ago.  EMS was able to control with Afrin and direct pressure.  He was on Eliquis  but stopped about 2 weeks ago.  Denies any injury to the nose.  Has had trouble with nosebleeds in the past.   Epistaxis      Prior to Admission medications   Medication Sig Start Date End Date Taking? Authorizing Provider  acetaminophen  (TYLENOL ) 500 MG tablet Take 1 tablet (500 mg total) by mouth every 6 (six) hours as needed for mild pain. 05/30/18   Vicci Burnard SAUNDERS, PA-C  amLODipine  (NORVASC ) 5 MG tablet Take 1 tablet (5 mg total) by mouth daily. 06/23/23   Chandra Toribio POUR, MD  apixaban  (ELIQUIS ) 5 MG TABS tablet Take 1 tablet (5 mg total) by mouth 2 (two) times daily. Patient not taking: Reported on 09/10/2024 07/21/23   Chandra Toribio POUR, MD  Folic Acid  5 MG CAPS Take 1 capsule (5 mg total) by mouth daily. 07/21/23   Chandra Toribio POUR, MD  hydrochlorothiazide  (HYDRODIURIL ) 25 MG tablet Take 1 tablet by mouth once daily 02/23/24   Dunn, Dayna N, PA-C  isosorbide  mononitrate (IMDUR ) 30 MG 24 hr tablet Take 0.5 tablets (15 mg total) by mouth daily. 09/10/24   Chandra Toribio POUR, MD  magnesium  oxide (MAG-OX) 400 MG tablet Take 1 tablet (400 mg total) by mouth at bedtime. 09/10/24   Chandra Toribio POUR, MD  Methylcobalamin (B-12) 5000 MCG TBDP Take 1 tablet by mouth daily. 07/01/23   Chandra Toribio POUR, MD  metoprolol  succinate (TOPROL -XL) 25 MG 24 hr tablet TAKE 1 TABLET BY MOUTH ONCE DAILY APPOINTMENT REQUIRED FOR FURTURE REFILLS 04/26/24   Delford Maude BROCKS, MD  nitroGLYCERIN  (NITROSTAT ) 0.4 MG SL tablet Place 1 tablet (0.4 mg total) under the tongue every 5 (five) minutes as needed for chest pain (or tightness). 05/17/24    Chandra Toribio POUR, MD  pravastatin  (PRAVACHOL ) 40 MG tablet TAKE 1 TABLET BY MOUTH AT BEDTIME 01/16/24   Nishan, Peter C, MD  Pyridoxine  HCl (B-6) 250 MG TABS Take 1 tablet by mouth daily. 08/18/23   Chandra Toribio POUR, MD  silver  sulfADIAZINE  (SILVADENE ) 1 % cream Apply 1 Application topically daily. 05/03/22   Abonza, Maritza, PA-C  spironolactone  (ALDACTONE ) 25 MG tablet Take 1 tablet (25 mg total) by mouth daily. 06/07/24   Chandra Toribio POUR, MD    Allergies: Duloxetine     Review of Systems  HENT:  Positive for nosebleeds.     Updated Vital Signs BP 137/89   Pulse 87   Temp 98.7 F (37.1 C) (Oral)   Resp (!) 28   Ht 6' (1.829 m)   Wt 93 kg   SpO2 98%   BMI 27.80 kg/m   Physical Exam Vitals and nursing note reviewed.  Constitutional:      Appearance: He is well-developed.  HENT:     Head: Normocephalic and atraumatic.     Nose:     Comments: Perhaps some mild hypervascularity to the Kiesselbach's plexus on the left.  Some dried blood at the base of the naris.  No obvious continued bleeding. Eyes:     Pupils: Pupils are equal, round, and  reactive to light.  Neck:     Vascular: No JVD.  Cardiovascular:     Rate and Rhythm: Normal rate and regular rhythm.     Heart sounds: No murmur heard.    No friction rub. No gallop.  Pulmonary:     Effort: No respiratory distress.     Breath sounds: No wheezing.  Abdominal:     General: There is no distension.     Tenderness: There is no abdominal tenderness. There is no guarding or rebound.  Musculoskeletal:        General: Normal range of motion.     Cervical back: Normal range of motion and neck supple.  Skin:    Coloration: Skin is not pale.     Findings: No rash.  Neurological:     Mental Status: He is alert and oriented to person, place, and time.  Psychiatric:        Behavior: Behavior normal.     (all labs ordered are listed, but only abnormal results are displayed) Labs Reviewed - No data to  display  EKG: None  Radiology: No results found.   Epistaxis Management  Date/Time: 09/14/2024 11:03 PM  Performed by: Emil Share, DO Authorized by: Emil Share, DO   Consent:    Consent obtained:  Verbal   Consent given by:  Patient   Risks, benefits, and alternatives were discussed: yes     Risks discussed:  Bleeding Universal protocol:    Procedure explained and questions answered to patient or proxy's satisfaction: yes     Patient identity confirmed:  Verbally with patient Anesthesia:    Anesthesia method:  Topical application   Topical anesthetic:  Lidocaine  gel Procedure details:    Treatment site:  L anterior   Treatment method:  Silver  nitrate   Treatment complexity:  Limited   Treatment episode: recurring   Post-procedure details:    Assessment:  Bleeding stopped   Procedure completion:  Tolerated well, no immediate complications    Medications Ordered in the ED  silver  nitrate applicators applicator 1 Stick (1 Stick Topical Given 09/14/24 2144)  oxymetazoline  (AFRIN) 0.05 % nasal spray 1 spray (1 spray Each Nare Given 09/14/24 2143)  lidocaine -EPINEPHrine  (XYLOCAINE  W/EPI) 2 %-1:200000 (PF) injection 10 mL (10 mLs Intradermal Given 09/14/24 2143)                                    Medical Decision Making Risk OTC drugs. Prescription drug management.   89 yo M with a chief complaints of nosebleed.  Patient has had this happen to him before.  He denies any injury.  Was on blood thinners but recently stopped a couple weeks ago.  Some mild hypervascularity in Kiesselbach's plexus.  Will cauterize here.  Patient had some recurrent bleeding while in the ED.  Given Afrin mix with lidocaine  and pressure held.  Resolved.  Cauterized.  Observed for about an hour post without recurrence.  Will discharge home.  ENT follow-up.  11:04 PM:  I have discussed the diagnosis/risks/treatment options with the patient.  Evaluation and diagnostic testing in the emergency  department does not suggest an emergent condition requiring admission or immediate intervention beyond what has been performed at this time.  They will follow up with PCP, ENT. We also discussed returning to the ED immediately if new or worsening sx occur. We discussed the sx which are most concerning (e.g., sudden worsening pain, fever,  inability to tolerate by mouth) that necessitate immediate return. Medications administered to the patient during their visit and any new prescriptions provided to the patient are listed below.  Medications given during this visit Medications  silver  nitrate applicators applicator 1 Stick (1 Stick Topical Given 09/14/24 2144)  oxymetazoline  (AFRIN) 0.05 % nasal spray 1 spray (1 spray Each Nare Given 09/14/24 2143)  lidocaine -EPINEPHrine  (XYLOCAINE  W/EPI) 2 %-1:200000 (PF) injection 10 mL (10 mLs Intradermal Given 09/14/24 2143)     The patient appears reasonably screen and/or stabilized for discharge and I doubt any other medical condition or other [facility] requiring further screening, evaluation, or treatment in the ED at this time prior to discharge.       Final diagnoses:  Left-sided epistaxis    ED Discharge Orders     None          Emil Share, DO 09/14/24 2304

## 2024-09-15 ENCOUNTER — Emergency Department (HOSPITAL_COMMUNITY)

## 2024-09-15 ENCOUNTER — Emergency Department (HOSPITAL_COMMUNITY)
Admission: EM | Admit: 2024-09-15 | Discharge: 2024-09-16 | Disposition: A | Discharge status: Home / Self Care | Attending: Emergency Medicine | Admitting: Emergency Medicine

## 2024-09-15 DIAGNOSIS — I10 Essential (primary) hypertension: Secondary | ICD-10-CM | POA: Diagnosis not present

## 2024-09-15 DIAGNOSIS — R040 Epistaxis: Secondary | ICD-10-CM | POA: Diagnosis not present

## 2024-09-15 DIAGNOSIS — J9811 Atelectasis: Secondary | ICD-10-CM | POA: Diagnosis not present

## 2024-09-15 DIAGNOSIS — I700 Atherosclerosis of aorta: Secondary | ICD-10-CM | POA: Diagnosis not present

## 2024-09-15 DIAGNOSIS — Z7901 Long term (current) use of anticoagulants: Secondary | ICD-10-CM | POA: Diagnosis not present

## 2024-09-15 DIAGNOSIS — I4891 Unspecified atrial fibrillation: Secondary | ICD-10-CM | POA: Diagnosis not present

## 2024-09-15 LAB — CBC WITH DIFFERENTIAL/PLATELET
Abs Immature Granulocytes: 0.01 K/uL (ref 0.00–0.07)
Basophils Absolute: 0 K/uL (ref 0.0–0.1)
Basophils Relative: 0 %
Eosinophils Absolute: 0 K/uL (ref 0.0–0.5)
Eosinophils Relative: 1 %
HCT: 43.3 % (ref 39.0–52.0)
Hemoglobin: 14.3 g/dL (ref 13.0–17.0)
Immature Granulocytes: 0 %
Lymphocytes Relative: 16 %
Lymphs Abs: 0.9 K/uL (ref 0.7–4.0)
MCH: 29.1 pg (ref 26.0–34.0)
MCHC: 33 g/dL (ref 30.0–36.0)
MCV: 88 fL (ref 80.0–100.0)
Monocytes Absolute: 0.6 K/uL (ref 0.1–1.0)
Monocytes Relative: 11 %
Neutro Abs: 4.2 K/uL (ref 1.7–7.7)
Neutrophils Relative %: 72 %
Platelets: 180 K/uL (ref 150–400)
RBC: 4.92 MIL/uL (ref 4.22–5.81)
RDW: 14.6 % (ref 11.5–15.5)
WBC: 5.9 K/uL (ref 4.0–10.5)
nRBC: 0 % (ref 0.0–0.2)

## 2024-09-15 LAB — COMPREHENSIVE METABOLIC PANEL WITH GFR
ALT: 16 U/L (ref 0–44)
AST: 26 U/L (ref 15–41)
Albumin: 3.8 g/dL (ref 3.5–5.0)
Alkaline Phosphatase: 83 U/L (ref 38–126)
Anion gap: 11 (ref 5–15)
BUN: 35 mg/dL — ABNORMAL HIGH (ref 8–23)
CO2: 25 mmol/L (ref 22–32)
Calcium: 9.6 mg/dL (ref 8.9–10.3)
Chloride: 99 mmol/L (ref 98–111)
Creatinine, Ser: 1.07 mg/dL (ref 0.61–1.24)
GFR, Estimated: >60 mL/min (ref >60)
Glucose, Bld: 126 mg/dL — ABNORMAL HIGH (ref 70–99)
Potassium: 4.4 mmol/L (ref 3.5–5.1)
Sodium: 135 mmol/L (ref 135–145)
Total Bilirubin: 0.6 mg/dL (ref 0.0–1.2)
Total Protein: 6.6 g/dL (ref 6.5–8.1)

## 2024-09-15 LAB — APTT: aPTT: 29 s (ref 24–36)

## 2024-09-15 LAB — PROTIME-INR
INR: 1.1 (ref 0.8–1.2)
Prothrombin Time: 14.8 s (ref 11.4–15.2)

## 2024-09-15 MED ORDER — OXYMETAZOLINE HCL 0.05 % NA SOLN
1.0000 | Freq: Once | NASAL | Status: AC
  Administered 2024-09-15: 1 via NASAL
  Filled 2024-09-15: qty 15

## 2024-09-15 NOTE — ED Triage Notes (Signed)
 Pt arrives via [facility] from home for L sided epistaxis. Pt here recently for the same with multiple unsuccessful tx. Pt given afrin with EMS without relief. No anticoagulation for the last 6 weeks per pt.

## 2024-09-15 NOTE — ED Notes (Signed)
 Skinner, MD placed merocel with afrin in left nare. Bleeding controlled at this time. Patient stated to feel less blood in the back of his throat.

## 2024-09-15 NOTE — ED Provider Notes (Signed)
 [place] EMERGENCY DEPARTMENT AT [facility] Provider Note   CSN: [id number] Arrival date & time: 09/15/24  1946     Patient presents with: Epistaxis   Vincent Mcclure is a 89 y.o. male. Hx of a fib on eliquis , recurrent nosebleeds presenting with epistaxis.  Notably was seen yesterday for similar.  Silver  nitrate placed in the left anterior nare yesterday, bleeding stopped.  Also given Afrin mix with lidocaine  and pressure held.  Mild hypervascularity in [place] box plexus.  Endorses new onset of left-sided epistaxis today, given Afrin with EMS without relief.  No anticoagulation for the last 6 weeks per patient.  Denies nausea or vomiting.  {Add pertinent medical, surgical, social history, OB history to HPI:32947}  Epistaxis      Prior to Admission medications   Medication Sig Start Date End Date Taking? Authorizing Provider  acetaminophen  (TYLENOL ) 500 MG tablet Take 1 tablet (500 mg total) by mouth every 6 (six) hours as needed for mild pain. 05/30/18   Vicci Burnard SAUNDERS, PA-C  amLODipine  (NORVASC ) 5 MG tablet Take 1 tablet (5 mg total) by mouth daily. 06/23/23   Chandra Toribio POUR, MD  apixaban  (ELIQUIS ) 5 MG TABS tablet Take 1 tablet (5 mg total) by mouth 2 (two) times daily. Patient not taking: Reported on 09/10/2024 07/21/23   Chandra Toribio POUR, MD  Folic Acid  5 MG CAPS Take 1 capsule (5 mg total) by mouth daily. 07/21/23   Chandra Toribio POUR, MD  hydrochlorothiazide  (HYDRODIURIL ) 25 MG tablet Take 1 tablet by mouth once daily 02/23/24   Dunn, Dayna N, PA-C  isosorbide  mononitrate (IMDUR ) 30 MG 24 hr tablet Take 0.5 tablets (15 mg total) by mouth daily. 09/10/24   Chandra Toribio POUR, MD  magnesium  oxide (MAG-OX) 400 MG tablet Take 1 tablet (400 mg total) by mouth at bedtime. 09/10/24   Chandra Toribio POUR, MD  Methylcobalamin (B-12) 5000 MCG TBDP Take 1 tablet by mouth daily. 07/01/23   Chandra Toribio POUR, MD  metoprolol  succinate (TOPROL -XL) 25 MG 24 hr tablet TAKE 1 TABLET BY MOUTH ONCE  DAILY APPOINTMENT REQUIRED FOR FURTURE REFILLS 04/26/24   Delford Maude BROCKS, MD  nitroGLYCERIN  (NITROSTAT ) 0.4 MG SL tablet Place 1 tablet (0.4 mg total) under the tongue every 5 (five) minutes as needed for chest pain (or tightness). 05/17/24   Chandra Toribio POUR, MD  pravastatin  (PRAVACHOL ) 40 MG tablet TAKE 1 TABLET BY MOUTH AT BEDTIME 01/16/24   Nishan, Peter C, MD  Pyridoxine  HCl (B-6) 250 MG TABS Take 1 tablet by mouth daily. 08/18/23   Chandra Toribio POUR, MD  silver  sulfADIAZINE  (SILVADENE ) 1 % cream Apply 1 Application topically daily. 05/03/22   Abonza, Maritza, PA-C  spironolactone  (ALDACTONE ) 25 MG tablet Take 1 tablet (25 mg total) by mouth daily. 06/07/24   Chandra Toribio POUR, MD    Allergies: Duloxetine     Review of Systems  HENT:  Positive for nosebleeds.     Updated Vital Signs BP (!) 144/93 (BP Location: Right Arm)   Pulse 98   Temp 97.9 F (36.6 C) (Axillary)   Resp 16   SpO2 100%   Physical Exam Vitals and nursing note reviewed.  Constitutional:      General: He is not in acute distress.    Appearance: He is well-developed.  HENT:     Head: Normocephalic and atraumatic.     Nose:     Comments: Evidence of mild hypervascularity to the Kiesselbach's plexus on the left.  Some dried  blood at the base of the naris.  No obvious continued bleeding. Actively holding compression. Eyes:     Conjunctiva/sclera: Conjunctivae normal.  Cardiovascular:     Rate and Rhythm: Normal rate and regular rhythm.     Heart sounds: No murmur heard. Pulmonary:     Effort: Pulmonary effort is normal. No respiratory distress.     Breath sounds: Normal breath sounds.  Abdominal:     Palpations: Abdomen is soft.     Tenderness: There is no abdominal tenderness.  Musculoskeletal:        General: No swelling.     Cervical back: Neck supple.  Skin:    General: Skin is warm and dry.     Capillary Refill: Capillary refill takes less than 2 seconds.  Neurological:     Mental Status: He is alert.   Psychiatric:        Mood and Affect: Mood normal.     (all labs ordered are listed, but only abnormal results are displayed) Labs Reviewed - No data to display  EKG: None  Radiology: No results found.  {Document cardiac monitor, telemetry assessment procedure when appropriate:32947} Procedures   Medications Ordered in the ED - No data to display    {Click here for ABCD2, HEART and other calculators REFRESH Note before signing:1}                              Medical Decision Making Amount and/or Complexity of Data Reviewed Labs: ordered.  Risk OTC drugs.   ***  {Document critical care time when appropriate  Document review of labs and clinical decision tools ie CHADS2VASC2, etc  Document your independent review of radiology images and any outside records  Document your discussion with family members, caretakers and with consultants  Document social determinants of health affecting pt's care  Document your decision making why or why not admission, treatments were needed:32947:::1}   Final diagnoses:  None    ED Discharge Orders     None

## 2024-09-15 NOTE — Discharge Instructions (Signed)
 Please keep the nasal packing in place.  Please do not remove the tape, please do not remove the nasal packing.  It is very important that you keep it in place. Please call ENT on Monday to be able to see them in the office.  They will see you immediately with the packing in place, and per the request from the ED.

## 2024-09-16 ENCOUNTER — Other Ambulatory Visit: Payer: Self-pay

## 2024-09-16 ENCOUNTER — Encounter (HOSPITAL_COMMUNITY): Payer: Self-pay

## 2024-09-16 ENCOUNTER — Emergency Department (HOSPITAL_COMMUNITY)
Admission: EM | Admit: 2024-09-16 | Discharge: 2024-09-16 | Disposition: A | Discharge status: Home / Self Care | Source: Ambulatory Visit | Attending: Emergency Medicine | Admitting: Emergency Medicine

## 2024-09-16 DIAGNOSIS — R040 Epistaxis: Secondary | ICD-10-CM | POA: Diagnosis not present

## 2024-09-16 DIAGNOSIS — K5900 Constipation, unspecified: Secondary | ICD-10-CM | POA: Diagnosis not present

## 2024-09-16 DIAGNOSIS — R58 Hemorrhage, not elsewhere classified: Secondary | ICD-10-CM | POA: Diagnosis not present

## 2024-09-16 DIAGNOSIS — I10 Essential (primary) hypertension: Secondary | ICD-10-CM | POA: Diagnosis not present

## 2024-09-16 NOTE — ED Triage Notes (Addendum)
 Patient arrived by [facility] from home with 4 episodes of epistaxis for the past week. No active bleeding on arrival. Patient alert and oriented, does take eliquis  but patient reports that he stopped several weeks ago and only taking baby aspirin . Denies trauma. Seen last night for same and appears rhino rocket still in place but blood noted to shirt and around device

## 2024-09-16 NOTE — Discharge Instructions (Signed)
 As discussed, go to the ENT office tomorrow for your continuing concerns for your nosebleed.

## 2024-09-16 NOTE — ED Provider Notes (Signed)
 [place] [facility] AT [facility] Provider Note   CSN: [id number] Arrival date & time: 09/16/24  9058     Patient presents with: Epistaxis  Vincent Mcclure is a 89 y.o. male who returns for his third visit as many days for epistaxis.  On 5 December patient was evaluated had silver  nitrate cautery of the left nare.  On visit on sixth December he had Merisel placed into the nare with Afrin, and had ambulatory referral to ENT.  He had continued bleeding this morning prompting him to present to the ED today for reevaluation.  Received Afrin spray by EMS, bleeding has stopped per EMS.   HPI     Prior to Admission medications   Medication Sig Start Date End Date Taking? Authorizing Provider  acetaminophen  (TYLENOL ) 500 MG tablet Take 1 tablet (500 mg total) by mouth every 6 (six) hours as needed for mild pain. 05/30/18   Vicci Burnard SAUNDERS, PA-C  amLODipine  (NORVASC ) 5 MG tablet Take 1 tablet (5 mg total) by mouth daily. 06/23/23   Chandra Toribio POUR, MD  apixaban  (ELIQUIS ) 5 MG TABS tablet Take 1 tablet (5 mg total) by mouth 2 (two) times daily. Patient not taking: Reported on 09/10/2024 07/21/23   Chandra Toribio POUR, MD  Folic Acid  5 MG CAPS Take 1 capsule (5 mg total) by mouth daily. 07/21/23   Chandra Toribio POUR, MD  hydrochlorothiazide  (HYDRODIURIL ) 25 MG tablet Take 1 tablet by mouth once daily 02/23/24   Dunn, Dayna N, PA-C  isosorbide  mononitrate (IMDUR ) 30 MG 24 hr tablet Take 0.5 tablets (15 mg total) by mouth daily. 09/10/24   Chandra Toribio POUR, MD  magnesium  oxide (MAG-OX) 400 MG tablet Take 1 tablet (400 mg total) by mouth at bedtime. 09/10/24   Chandra Toribio POUR, MD  Methylcobalamin (B-12) 5000 MCG TBDP Take 1 tablet by mouth daily. 07/01/23   Chandra Toribio POUR, MD  metoprolol  succinate (TOPROL -XL) 25 MG 24 hr tablet TAKE 1 TABLET BY MOUTH ONCE DAILY APPOINTMENT REQUIRED FOR FURTURE REFILLS 04/26/24   Delford Maude BROCKS, MD  nitroGLYCERIN  (NITROSTAT ) 0.4 MG SL tablet Place 1 tablet  (0.4 mg total) under the tongue every 5 (five) minutes as needed for chest pain (or tightness). 05/17/24   Chandra Toribio POUR, MD  pravastatin  (PRAVACHOL ) 40 MG tablet TAKE 1 TABLET BY MOUTH AT BEDTIME 01/16/24   Nishan, Peter C, MD  Pyridoxine  HCl (B-6) 250 MG TABS Take 1 tablet by mouth daily. 08/18/23   Chandra Toribio POUR, MD  silver  sulfADIAZINE  (SILVADENE ) 1 % cream Apply 1 Application topically daily. 05/03/22   Abonza, Maritza, PA-C  spironolactone  (ALDACTONE ) 25 MG tablet Take 1 tablet (25 mg total) by mouth daily. 06/07/24   Chandra Toribio POUR, MD    Allergies: Duloxetine     Review of Systems  HENT:  Positive for nosebleeds.   All other systems reviewed and are negative.   Updated Vital Signs BP 128/82   Pulse 99   Temp 97.7 F (36.5 C)   Resp 17   SpO2 100%   Physical Exam Vitals and nursing note reviewed.  Constitutional:      General: He is not in acute distress.    Appearance: Normal appearance.  HENT:     Head: Normocephalic and atraumatic.     Nose:     Left Nostril: Occlusion present.     Comments: Nasal packing in place on the left nare, no epistaxis appreciated at exam, no posterior nasal drainage was appreciated.  Mouth/Throat:     Mouth: Mucous membranes are moist.     Pharynx: Oropharynx is clear.  Eyes:     Extraocular Movements: Extraocular movements intact.     Conjunctiva/sclera: Conjunctivae normal.     Pupils: Pupils are equal, round, and reactive to light.  Cardiovascular:     Rate and Rhythm: Normal rate and regular rhythm.     Pulses: Normal pulses.     Heart sounds: Normal heart sounds. No murmur heard.    No friction rub. No gallop.  Pulmonary:     Effort: Pulmonary effort is normal.     Breath sounds: Normal breath sounds.  Abdominal:     General: Abdomen is flat. Bowel sounds are normal.     Palpations: Abdomen is soft.  Musculoskeletal:        General: Normal range of motion.     Cervical back: Normal range of motion and neck supple.     Right  lower leg: No edema.     Left lower leg: No edema.  Skin:    General: Skin is warm and dry.     Capillary Refill: Capillary refill takes less than 2 seconds.  Neurological:     General: No focal deficit present.     Mental Status: He is alert and oriented to person, place, and time. Mental status is at baseline.     GCS: GCS eye subscore is 4. GCS verbal subscore is 5. GCS motor subscore is 6.  Psychiatric:        Mood and Affect: Mood normal.     (all labs ordered are listed, but only abnormal results are displayed) Labs Reviewed - No data to display  EKG: None  Radiology: DG Chest Portable 1 View Result Date: 09/15/2024 EXAM: 1 VIEW(S) XRAY OF THE CHEST 09/15/2024 09:46:00 PM COMPARISON: None available. CLINICAL HISTORY: epistaxis, eval for aspiration FINDINGS: LUNGS AND PLEURA: Patchy atelectasis in lung bases. No pleural effusion. No pneumothorax. HEART AND MEDIASTINUM: CABG markers noted. Aortic atherosclerosis. BONES AND SOFT TISSUES: Sternotomy wires noted. IMPRESSION: 1. No acute cardiopulmonary findings. Electronically signed by: Morgane Naveau MD 09/15/2024 10:00 PM EST RP Workstation: [id number]     Procedures   Medications Ordered in the ED - No data to display                                  Medical Decision Making  Medical Decision Making:   Vincent Mcclure is a 89 y.o. male who presented to the ED today with epistaxis detailed above.     Complete initial physical exam performed, notably the patient  was alert and oriented no apparent distress.  Does not have any active epistaxis on evaluation, no posterior nasal hemorrhage appreciated..    Reviewed and confirmed nursing documentation for past medical history, family history, social history.    Initial Assessment:   With the patient's presentation of epistaxis, most likely diagnosis is anterior epistaxis.  Consider possible posterior epistaxis however evaluation does not demonstrate the same.    Initial Plan:   Extensive lab evaluation completed yesterday as such we will defer lab evaluation at this time. Patient repeatedly requested imaging to be performed, however discussed with epistaxis imaging of limited to no benefit and therefore will not order imaging at this time.    Reassessment and Plan:   On evaluation patient does not have any active epistaxis, at this time we will plan for  discharge with outpatient follow-up to ENT as previously discussed on his previous visit.  Nasal packing will stay in place to help manage the epistaxis, rerefer to ENT with ambulatory referral.  Blood pressure is stable, does not show any signs of hypotension nor of acute anemia and therefore we will discharge with outpatient follow-up as previously discussed.       Final diagnoses:  Epistaxis    ED Discharge Orders          Ordered    Ambulatory referral to ENT        09/16/24 1115               Myriam Dorn BROCKS, [place] 09/16/24 1119

## 2024-09-19 ENCOUNTER — Encounter: Payer: Self-pay | Admitting: Family Medicine

## 2024-09-19 ENCOUNTER — Ambulatory Visit (INDEPENDENT_AMBULATORY_CARE_PROVIDER_SITE_OTHER): Admitting: Family Medicine

## 2024-09-19 ENCOUNTER — Other Ambulatory Visit: Payer: Self-pay | Admitting: Family Medicine

## 2024-09-19 VITALS — BP 150/86 | HR 96 | Ht 72.0 in | Wt 196.0 lb

## 2024-09-19 DIAGNOSIS — I480 Paroxysmal atrial fibrillation: Secondary | ICD-10-CM

## 2024-09-19 DIAGNOSIS — R040 Epistaxis: Secondary | ICD-10-CM | POA: Diagnosis not present

## 2024-09-19 DIAGNOSIS — I5032 Chronic diastolic (congestive) heart failure: Secondary | ICD-10-CM | POA: Diagnosis not present

## 2024-09-19 DIAGNOSIS — I25118 Atherosclerotic heart disease of native coronary artery with other forms of angina pectoris: Secondary | ICD-10-CM | POA: Diagnosis not present

## 2024-09-19 DIAGNOSIS — R600 Localized edema: Secondary | ICD-10-CM | POA: Diagnosis not present

## 2024-09-19 DIAGNOSIS — I214 Non-ST elevation (NSTEMI) myocardial infarction: Secondary | ICD-10-CM

## 2024-09-19 DIAGNOSIS — I25119 Atherosclerotic heart disease of native coronary artery with unspecified angina pectoris: Secondary | ICD-10-CM | POA: Diagnosis not present

## 2024-09-19 MED ORDER — NITROGLYCERIN 0.4 MG SL SUBL: 0.4000 mg | SUBLINGUAL_TABLET | SUBLINGUAL | 3 refills | Status: AC | PRN

## 2024-09-19 NOTE — Progress Notes (Unsigned)
° °  Acute Office Visit  Subjective:     Patient ID: Vincent Mcclure, male    DOB: Feb 15, 1932, 89 y.o.   MRN: [id number]  Chief Complaint  Patient presents with   Fall    HPI Patient is in today for ***    ROS      Objective:    BP (!) 148/73   Pulse 96   Ht 6' (1.829 m)   Wt 196 lb (88.9 kg)   SpO2 98%   BMI 26.58 kg/m  {Vitals History (Optional):23777}  Physical Exam  No results found for any visits on 09/19/24.      Assessment & Plan:   There are no diagnoses linked to this encounter.   No follow-ups on file.  Toribio MARLA Slain, MD

## 2024-09-19 NOTE — Patient Instructions (Addendum)
 It was nice to see you today,  We addressed the following topics today: - You have a cardiology appointment tomorrow.  - I want you to take amlodipine , pravastatin , and metoprolol .  - if the nitroglycerin  doesn't ease your chest pain have someone take you to the emergency department.   Have a great day,  Rolan Slain, MD
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
## 2024-09-20 ENCOUNTER — Ambulatory Visit: Attending: Cardiovascular Disease | Admitting: Cardiovascular Disease

## 2024-09-20 ENCOUNTER — Encounter: Payer: Self-pay | Admitting: Cardiovascular Disease

## 2024-09-20 VITALS — BP 148/80 | HR 85 | Ht 72.0 in | Wt 195.0 lb

## 2024-09-20 DIAGNOSIS — I480 Paroxysmal atrial fibrillation: Secondary | ICD-10-CM

## 2024-09-20 DIAGNOSIS — I2510 Atherosclerotic heart disease of native coronary artery without angina pectoris: Secondary | ICD-10-CM | POA: Diagnosis not present

## 2024-09-20 DIAGNOSIS — Z951 Presence of aortocoronary bypass graft: Secondary | ICD-10-CM | POA: Diagnosis not present

## 2024-09-20 DIAGNOSIS — I10 Essential (primary) hypertension: Secondary | ICD-10-CM | POA: Diagnosis not present

## 2024-09-20 DIAGNOSIS — I209 Angina pectoris, unspecified: Secondary | ICD-10-CM

## 2024-09-20 DIAGNOSIS — E782 Mixed hyperlipidemia: Secondary | ICD-10-CM

## 2024-09-20 LAB — CBC WITH DIFFERENTIAL/PLATELET
Basophils Absolute: 0 x10E3/uL (ref 0.0–0.2)
Basos: 0 %
EOS (ABSOLUTE): 0.1 x10E3/uL (ref 0.0–0.4)
Eos: 1 %
Hematocrit: 34.9 % — ABNORMAL LOW (ref 37.5–51.0)
Hemoglobin: 11.8 g/dL — ABNORMAL LOW (ref 13.0–17.7)
Immature Grans (Abs): 0 x10E3/uL (ref 0.0–0.1)
Immature Granulocytes: 0 %
Lymphocytes Absolute: 0.6 x10E3/uL — ABNORMAL LOW (ref 0.7–3.1)
Lymphs: 13 %
MCH: 30.1 pg (ref 26.6–33.0)
MCHC: 33.8 g/dL (ref 31.5–35.7)
MCV: 89 fL (ref 79–97)
Monocytes Absolute: 0.5 x10E3/uL (ref 0.1–0.9)
Monocytes: 11 %
Neutrophils Absolute: 3.6 x10E3/uL (ref 1.4–7.0)
Neutrophils: 75 %
Platelets: 175 x10E3/uL (ref 150–450)
RBC: 3.92 x10E6/uL — ABNORMAL LOW (ref 4.14–5.80)
RDW: 13.6 % (ref 11.6–15.4)
WBC: 4.8 x10E3/uL (ref 3.4–10.8)

## 2024-09-20 LAB — IRON,TIBC AND FERRITIN PANEL
Ferritin: 131 ng/mL (ref 30–400)
Iron Saturation: 17 % (ref 15–55)
Iron: 50 ug/dL (ref 38–169)
Total Iron Binding Capacity: 299 ug/dL (ref 250–450)
UIBC: 249 ug/dL (ref 111–343)

## 2024-09-20 LAB — BRAIN NATRIURETIC PEPTIDE: BNP: 138.3 pg/mL — ABNORMAL HIGH (ref 0.0–100.0)

## 2024-09-20 NOTE — Addendum Note (Signed)
 Addended by: Quorra Rosene N on: 09/20/2024 10:20 AM   Modules accepted: Orders

## 2024-09-20 NOTE — Patient Instructions (Signed)
 Medication Instructions:  Your physician recommends that you continue on your current medications as directed. Please refer to the Current Medication list given to you today.  *If you need a refill on your cardiac medications before your next appointment, please call your pharmacy*  Lab Work: NONE If you have labs (blood work) drawn today and your tests are completely normal, you will receive your results only by: MyChart Message (if you have MyChart) OR A paper copy in the mail If you have any lab test that is abnormal or we need to change your treatment, we will call you to review the results.  Testing/Procedures: Lexiscan  Myoview  Stress Test  Follow-Up: At [facility], you and your health needs are our priority.  As part of our continuing mission to provide you with exceptional heart care, our providers are all part of one team.  This team includes your primary Cardiologist (physician) and Advanced Practice Providers or APPs (Physician Assistants and Nurse Practitioners) who all work together to provide you with the care you need, when you need it.  Your next appointment:   6 month(s)  Provider:   Maude Emmer, MD    We recommend signing up for the patient portal called MyChart.  Sign up information is provided on this After Visit Summary.  MyChart is used to connect with patients for Virtual Visits (Telemedicine).  Patients are able to view lab/test results, encounter notes, upcoming appointments, etc.  Non-urgent messages can be sent to your provider as well.   To learn more about what you can do with MyChart, go to [email or web address].

## 2024-09-21 ENCOUNTER — Ambulatory Visit: Payer: Self-pay | Admitting: Family Medicine

## 2024-09-21 DIAGNOSIS — R040 Epistaxis: Secondary | ICD-10-CM | POA: Insufficient documentation

## 2024-09-21 NOTE — Assessment & Plan Note (Addendum)
-   increasing frequency of chest pain with three episodes in the last few days, requiring nitroglycerin  for relief. Last episode was last night. Associated with generalized weakness and recent blood loss from epistaxis.  - no significant changes on EKG today - bnp, iron , cbc - Will attempt to contact cardiology office to schedule an appointment.

## 2024-09-21 NOTE — Assessment & Plan Note (Signed)
-   Recent severe left-sided epistaxis, started after initiating spironolactone . Required multiple ED visits and ENT intervention with electric cauterization. Bleeding is now minimal. - Continue to follow up with ENT as scheduled this Friday.

## 2024-09-24 NOTE — Progress Notes (Signed)
 Called patient he stated there are no  chest pains at this time and he started taking the imdur  he said its helping

## 2024-10-08 ENCOUNTER — Encounter (HOSPITAL_COMMUNITY)

## 2024-11-13 ENCOUNTER — Other Ambulatory Visit: Payer: Self-pay | Admitting: Cardiovascular Disease

## 2024-11-13 DIAGNOSIS — I25810 Atherosclerosis of coronary artery bypass graft(s) without angina pectoris: Secondary | ICD-10-CM

## 2024-11-13 DIAGNOSIS — E782 Mixed hyperlipidemia: Secondary | ICD-10-CM

## 2024-12-10 ENCOUNTER — Ambulatory Visit: Admitting: Family Medicine

## 2025-05-15 ENCOUNTER — Other Ambulatory Visit

## 2025-05-15 ENCOUNTER — Ambulatory Visit: Admitting: Hematology
